# Patient Record
Sex: Female | Born: 1940 | Race: White | Hispanic: No | Smoking: Former smoker
Health system: Southern US, Community
[De-identification: ages and names within clinical notes are randomized; demographics above are authoritative.]

## PROBLEM LIST (undated history)

## (undated) DIAGNOSIS — I10 Essential (primary) hypertension: Secondary | ICD-10-CM

## (undated) DIAGNOSIS — K5790 Diverticulosis of intestine, part unspecified, without perforation or abscess without bleeding: Secondary | ICD-10-CM

## (undated) DIAGNOSIS — I2510 Atherosclerotic heart disease of native coronary artery without angina pectoris: Secondary | ICD-10-CM

## (undated) DIAGNOSIS — Z8739 Personal history of other diseases of the musculoskeletal system and connective tissue: Secondary | ICD-10-CM

## (undated) DIAGNOSIS — G4733 Obstructive sleep apnea (adult) (pediatric): Secondary | ICD-10-CM

## (undated) DIAGNOSIS — G2581 Restless legs syndrome: Secondary | ICD-10-CM

## (undated) DIAGNOSIS — N183 Chronic kidney disease, stage 3 unspecified: Secondary | ICD-10-CM

## (undated) DIAGNOSIS — T8859XA Other complications of anesthesia, initial encounter: Secondary | ICD-10-CM

## (undated) DIAGNOSIS — I255 Ischemic cardiomyopathy: Secondary | ICD-10-CM

## (undated) DIAGNOSIS — F329 Major depressive disorder, single episode, unspecified: Secondary | ICD-10-CM

## (undated) DIAGNOSIS — C4491 Basal cell carcinoma of skin, unspecified: Secondary | ICD-10-CM

## (undated) DIAGNOSIS — M545 Low back pain, unspecified: Secondary | ICD-10-CM

## (undated) DIAGNOSIS — Z9989 Dependence on other enabling machines and devices: Secondary | ICD-10-CM

## (undated) DIAGNOSIS — Z9981 Dependence on supplemental oxygen: Secondary | ICD-10-CM

## (undated) DIAGNOSIS — M4800 Spinal stenosis, site unspecified: Secondary | ICD-10-CM

## (undated) DIAGNOSIS — Z87442 Personal history of urinary calculi: Secondary | ICD-10-CM

## (undated) DIAGNOSIS — G8929 Other chronic pain: Secondary | ICD-10-CM

## (undated) DIAGNOSIS — R079 Chest pain, unspecified: Secondary | ICD-10-CM

## (undated) DIAGNOSIS — Z8489 Family history of other specified conditions: Secondary | ICD-10-CM

## (undated) DIAGNOSIS — I739 Peripheral vascular disease, unspecified: Secondary | ICD-10-CM

## (undated) DIAGNOSIS — E785 Hyperlipidemia, unspecified: Secondary | ICD-10-CM

## (undated) DIAGNOSIS — T4145XA Adverse effect of unspecified anesthetic, initial encounter: Secondary | ICD-10-CM

## (undated) DIAGNOSIS — F419 Anxiety disorder, unspecified: Secondary | ICD-10-CM

## (undated) DIAGNOSIS — J189 Pneumonia, unspecified organism: Secondary | ICD-10-CM

## (undated) DIAGNOSIS — F32A Depression, unspecified: Secondary | ICD-10-CM

## (undated) DIAGNOSIS — K8020 Calculus of gallbladder without cholecystitis without obstruction: Secondary | ICD-10-CM

## (undated) DIAGNOSIS — C434 Malignant melanoma of scalp and neck: Secondary | ICD-10-CM

## (undated) DIAGNOSIS — R0789 Other chest pain: Secondary | ICD-10-CM

## (undated) DIAGNOSIS — E559 Vitamin D deficiency, unspecified: Secondary | ICD-10-CM

## (undated) DIAGNOSIS — K219 Gastro-esophageal reflux disease without esophagitis: Secondary | ICD-10-CM

## (undated) DIAGNOSIS — M1990 Unspecified osteoarthritis, unspecified site: Secondary | ICD-10-CM

## (undated) DIAGNOSIS — E119 Type 2 diabetes mellitus without complications: Secondary | ICD-10-CM

## (undated) DIAGNOSIS — G629 Polyneuropathy, unspecified: Secondary | ICD-10-CM

## (undated) DIAGNOSIS — J449 Chronic obstructive pulmonary disease, unspecified: Secondary | ICD-10-CM

## (undated) DIAGNOSIS — D649 Anemia, unspecified: Secondary | ICD-10-CM

## (undated) DIAGNOSIS — I214 Non-ST elevation (NSTEMI) myocardial infarction: Secondary | ICD-10-CM

## (undated) DIAGNOSIS — K589 Irritable bowel syndrome without diarrhea: Secondary | ICD-10-CM

## (undated) DIAGNOSIS — Z972 Presence of dental prosthetic device (complete) (partial): Secondary | ICD-10-CM

## (undated) HISTORY — PX: TOTAL KNEE ARTHROPLASTY: SHX125

## (undated) HISTORY — PX: DEBRIDEMENT TENNIS ELBOW: SHX1442

## (undated) HISTORY — PX: MELANOMA EXCISION: SHX5266

## (undated) HISTORY — DX: Hyperlipidemia, unspecified: E78.5

## (undated) HISTORY — PX: BACK SURGERY: SHX140

## (undated) HISTORY — PX: LUMBAR DISC SURGERY: SHX700

## (undated) HISTORY — DX: Diverticulosis of intestine, part unspecified, without perforation or abscess without bleeding: K57.90

## (undated) HISTORY — DX: Other chest pain: R07.89

## (undated) HISTORY — PX: SHOULDER ARTHROSCOPY W/ ROTATOR CUFF REPAIR: SHX2400

## (undated) HISTORY — DX: Unspecified osteoarthritis, unspecified site: M19.90

## (undated) HISTORY — DX: Vitamin D deficiency, unspecified: E55.9

## (undated) HISTORY — PX: CATARACT EXTRACTION, BILATERAL: SHX1313

## (undated) HISTORY — PX: APPENDECTOMY: SHX54

## (undated) HISTORY — PX: SHOULDER OPEN ROTATOR CUFF REPAIR: SHX2407

## (undated) HISTORY — PX: CARPAL TUNNEL RELEASE: SHX101

## (undated) HISTORY — PX: BASAL CELL CARCINOMA EXCISION: SHX1214

## (undated) HISTORY — DX: Restless legs syndrome: G25.81

## (undated) HISTORY — DX: Peripheral vascular disease, unspecified: I73.9

## (undated) HISTORY — DX: Essential (primary) hypertension: I10

## (undated) HISTORY — PX: JOINT REPLACEMENT: SHX530

## (undated) HISTORY — DX: Calculus of gallbladder without cholecystitis without obstruction: K80.20

## (undated) HISTORY — DX: Irritable bowel syndrome, unspecified: K58.9

## (undated) HISTORY — DX: Anxiety disorder, unspecified: F41.9

## (undated) NOTE — *Deleted (*Deleted)
HEMATOLOGY/ONCOLOGY CLINIC NOTE  Date of Service: 04/08/2020  Patient Care Team: Lucky Cowboy, MD as PCP - General (Internal Medicine) Chilton Si, MD as PCP - Cardiology (Cardiology) Janet Berlin, MD as Consulting Physician (Ophthalmology) Laurey Morale, MD as Consulting Physician (Cardiology) Exie Parody, MD as Consulting Physician (Oncology) Jones Broom, MD as Consulting Physician (Orthopedic Surgery) Danis, Andreas Blower, MD as Consulting Physician (Gastroenterology)  REFERRING PHYSICIAN: Lucky Cowboy, MD  CHIEF COMPLAINTS/PURPOSE OF CONSULTATION:  Pancytopenia  HISTORY OF PRESENTING ILLNESS:  Janet Choi is a wonderful 59 y.o. female who has been referred to [place] by Lucky Cowboy, MD for evaluation and management of pancytopenia. Pt is accompanied today by her sister, Janet Choi. The pt reports that she is doing well overall.   The pt reports she is good. She has acute inflammation in the gallbladder and it bothers her when she eats. Pt has chronic diarrhea and has been diagnosed with sleep apnea. She was given a sleep apnea machine about 7 years ago but did not use it. Pt's diabetes has not been well controlled recently. She is also on aspirin since her heart attack. Pt has been on Requip for several years now. She also has been on gabapentin for neuropathy for several years. Pt has starting taking iron supplement this week. She was recently placed on protonix and taken off of 100mg  of allopurinol. There is no liver or thyroid problems that she knows of.   She felt really bad after the second dose of the COVID19 vaccine and stayed in for 14 days back in April. She was really sick and her arm was red and swollen where the shot was administered. At the beginning of the year the pt had cellulitis and was on antibiotics for more than 3 weeks.   Pt has never had blood transfusions or IV iron. She has not had a gout attack in several years.   Of note prior to  the patient's visit today, pt has had US Abdomen Complete ([id number]) completed on 10/17/19 with results revealing "Cholelithiasis with positive sonographic Murphy's sign and mild wall thickening these changes would be consistent with acute cholecystitis in the appropriate clinical setting. HIDA scan may be helpful for further evaluation. Left renal calculi without obstructive changes. Left renal cyst."  Most recent lab results (10/15/19) of CBC is as follows: all values are WNL except for WBC at 2.5K, RBC at 2.65, Hemoglobin at 8.7, HCT at 26.5, Neutro Abs at 1353, Absolute Monocytes at 133, Glucose at 107, Creat at 1.30, GFR, Est Non Af Am at 39, GFR, Est AFR Am at 46, Total Protein at 5.8, AST at 8  On review of systems, pt reports SOB, chronic diarrhea, sleep apnea, fatigue, abdominal tenderness and denies infections, black/blood in stool, fever, chills, night sweats, unexpected weight loss, mouth soars, and any other symptoms.   On PMHx the pt reports chronic diarrhea, sleep apnea, diabetes, myocardial infarction, Vitamin B12 deficient, neuropathy, cellulitis  On Social Hx the pt reports no alcohol use   INTERVAL HISTORY: Janet Choi is a wonderful 73 y.o. female who is here for evaluation and management of pancytopenia.  The patient's last visit with [place] was on 02/08/2020. The pt reports that she is doing well overall.  The pt reports ***  Of note since the patient's last visit, pt has had *** completed on *** with results revealing ***.  Lab results today (04/08/20) of CBC w/diff and CMP is as follows: all values are  WNL except for ***. 04/09/2020 Vitamin B12 at *** 04/09/2020 Ferritin at *** 04/09/2020 Iron Panel is as follows: ***  On review of systems, pt reports *** and denies ***and any other symptoms.   A&P: -Discussed pt labwork today, 04/08/20; *** -***  MEDICAL HISTORY:  Past Medical History:  Diagnosis Date  . Anemia    hx (04/14/2016)  . Anxiety   . Arthritis     "severe in my back; hands; ankles" (04/14/2016)  . Atypical chest pain 01/24/2018   Midline pain absent supine "constant" daytime since 12/31/17 > resolved as of 02/20/2018 on gerd/ gas diet   . Basal cell carcinoma    "several burned off; one cut off"  . CAD in native artery    a. NSTEMI 04/2016 - s/p DES toLAD and LCx. PCI to LCx notable for microembolization during cath.  . Chest pain- reslved with stopping Brilinta now on Plaix 04/16/2016  . Chronic lower back pain   . CKD (chronic kidney disease), stage III (HCC)    stage 3  . Complication of anesthesia   . COPD (chronic obstructive pulmonary disease) (HCC)   . Depression   . Diverticulosis   . DJD (degenerative joint disease)   . Family history of adverse reaction to anesthesia   . Gallstones   . GERD (gastroesophageal reflux disease)   . History of gout   . History of kidney stones   . Hyperlipidemia   . Hypertension   . IBS (irritable bowel syndrome)   . Ischemic cardiomyopathy    a. 04/2016: EF 40-50% by cath, 50-55% +WMA by echo.  . 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  . Morbid obesity (HCC)   . NSTEMI (non-ST elevated myocardial infarction) (HCC) 04/14/2016  . Obstructive sleep apnea of adult    appt for cpap 8-8 to adjust settings no cpap at this time  . On supplemental oxygen therapy    uses at bedtime or exposed to heat, 2Liters  . Peripheral neuropathy    feet and toes  . Peripheral vascular disease (HCC)   . RLS (restless legs syndrome)   . Spinal stenosis   . Type II diabetes mellitus (HCC)   . Vitamin D deficiency   . Walking pneumonia   . Wears partial dentures    lower     SURGICAL HISTORY: Past Surgical History:  Procedure Laterality Date  . APPENDECTOMY    . BASAL CELL CARCINOMA EXCISION Left    leg  . BIOPSY  12/25/2019   Procedure: BIOPSY;  Surgeon: Shellia Cleverly, DO;  Location: [facility];  Service: Gastroenterology;;  EGD and COLON  . CARDIAC CATHETERIZATION N/A  04/15/2016   Procedure: Left Heart Cath and Coronary Angiography;  Surgeon: Lyn Records, MD;  Location[facility] INVASIVE CV LAB;  Service: Cardiovascular;  Laterality: N/A;  . CARDIAC CATHETERIZATION N/A 04/15/2016   Procedure: Coronary Stent Intervention;  Surgeon: Lyn Records, MD;  Location: [facility] INVASIVE CV LAB;  Service: Cardiovascular;  Laterality: N/A;  Mid LAD Mid CFX  . CARPAL TUNNEL RELEASE Right    with trigger finger release  . CATARACT EXTRACTION, BILATERAL Bilateral   . COLONOSCOPY WITH PROPOFOL N/A 12/25/2019   Procedure: COLONOSCOPY WITH PROPOFOL;  Surgeon: Shellia Cleverly, DO;  Location: [facility];  Service: Gastroenterology;  Laterality: N/A;  . DEBRIDEMENT TENNIS ELBOW Right   . ESOPHAGOGASTRODUODENOSCOPY (EGD) WITH PROPOFOL N/A 12/25/2019   Procedure: ESOPHAGOGASTRODUODENOSCOPY (EGD) WITH PROPOFOL;  Surgeon: Shellia Cleverly, DO;  Location: [facility];  Service: Gastroenterology;  Laterality: N/A;  . LUMBAR DISC SURGERY  X 2  . MELANOMA EXCISION Left    "towards the back of my neck"  . POLYPECTOMY  12/25/2019   Procedure: POLYPECTOMY;  Surgeon: Shellia Cleverly, DO;  Location: [facility];  Service: Gastroenterology;;  . SHOULDER ARTHROSCOPY W/ ROTATOR CUFF REPAIR Left   . SHOULDER OPEN ROTATOR CUFF REPAIR Right   . TOTAL KNEE ARTHROPLASTY Bilateral      SOCIAL HISTORY: Social History   Socioeconomic History  . Marital status: Widowed    Spouse name: Not on file  . Number of children: 1  . Years of education: Not on file  . Highest education level: Not on file  Occupational History  . Occupation: retired  Tobacco Use  . Smoking status: Former Smoker    Packs/day: 2.50    Years: 25.00    Pack years: 62.50    Types: Cigarettes    Quit date: 11/11/1983    Years since quitting: 36.4  . Smokeless tobacco: Never Used  Vaping Use  . Vaping Use: Never used  Substance and Sexual Activity  . Alcohol use: No    Alcohol/week: 0.0 standard drinks  . Drug use:  No  . Sexual activity: Not Currently  Other Topics Concern  . Not on file  Social History Narrative  . Not on file   Social Determinants of Health   Financial Resource Strain:   . Difficulty of Paying Living Expenses: Not on file  Food Insecurity:   . Worried About Programme researcher, broadcasting/film/video in the Last Year: Not on file  . Ran Out of Food in the Last Year: Not on file  Transportation Needs:   . Lack of Transportation (Medical): Not on file  . Lack of Transportation (Non-Medical): Not on file  Physical Activity:   . Days of Exercise per Week: Not on file  . Minutes of Exercise per Session: Not on file  Stress:   . Feeling of Stress : Not on file  Social Connections:   . Frequency of Communication with Friends and Family: Not on file  . Frequency of Social Gatherings with Friends and Family: Not on file  . Attends Religious Services: Not on file  . Active Member of Clubs or Organizations: Not on file  . Attends Banker Meetings: Not on file  . Marital Status: Not on file  Intimate Partner Violence:   . Fear of Current or Ex-Partner: Not on file  . Emotionally Abused: Not on file  . Physically Abused: Not on file  . Sexually Abused: Not on file     FAMILY HISTORY: Family History  Problem Relation Age of Onset  . Heart disease Mother   . Heart attack Mother   . Kidney disease Father   . AAA (abdominal aortic aneurysm) Father   . Parkinson's disease Father   . Cancer Brother        type unknown  . Heart disease Son   . Cancer Sister        female   . Alzheimer's disease Sister   . Colon cancer Neg Hx   . Colon polyps Neg Hx   . Esophageal cancer Neg Hx   . Pancreatic cancer Neg Hx   . Stomach cancer Neg Hx   . Liver disease Neg Hx   . Diabetes Neg Hx      ALLERGIES:   is allergic to brilinta [ticagrelor], aspirin, nsaids, minocycline hcl, oruvail [  ketoprofen], tricor [fenofibrate], vasotec [enalaprilat], and zinc.   MEDICATIONS:  Current Outpatient  Medications  Medication Sig Dispense Refill  . acetaminophen (TYLENOL) 325 MG tablet Take 2 tablets (650 mg total) by mouth every 4 (four) hours as needed for headache or mild pain.    Marland Kitchen albuterol (VENTOLIN HFA) 108 (90 Base) MCG/ACT inhaler Inhale 2 puffs into the lungs every 4 (four) hours as needed for wheezing or shortness of breath. 18 g 3  . aspirin EC 81 MG EC tablet Take 1 tablet (81 mg total) by mouth daily.    Marland Kitchen atorvastatin (LIPITOR) 80 MG tablet TAKE 1 TABLET (80 MG TOTAL) BY MOUTH DAILY AT 6 PM. 90 tablet 2  . Budeson-Glycopyrrol-Formoterol (BREZTRI AEROSPHERE) 160-9-4.8 MCG/ACT AERO Inhale 2 puffs into the lungs in the morning and at bedtime. 5.9 g 2  . buPROPion (WELLBUTRIN XL) 300 MG 24 hr tablet TAKE 1 TABLET (300 MG TOTAL) BY MOUTH DAILY FOR MOOD 90 tablet 3  . Cholecalciferol ([employer] VIT D 5000 HIGH-POTENCY) 5000 units capsule Take 5,000 Units by mouth 2 (two) times daily.     . Continuous Blood Gluc Receiver (FREESTYLE LIBRE 14 DAY READER) DEVI 1 Device by Does not apply route every 14 (fourteen) days. 2 each 4  . Continuous Blood Gluc Sensor (FREESTYLE LIBRE 14 DAY SENSOR) MISC Apply 1 application topically every 14 (fourteen) days. 2 each 4  . diclofenac sodium (VOLTAREN) 1 % GEL Apply 4 g topically 4 (four) times daily. (Patient taking differently: Apply 4 g topically 4 (four) times daily as needed (pain). ) 100 g 3  . escitalopram (LEXAPRO) 20 MG tablet Take 1 tablet (20 mg total) by mouth daily. 90 tablet 3  . ferrous sulfate 325 (65 FE) MG tablet Take 325 mg by mouth 2 (two) times daily with a meal.     . furosemide (LASIX) 40 MG tablet Take 40 mg by mouth daily as needed for fluid.     Marland Kitchen gabapentin (NEURONTIN) 100 MG capsule Take 1 to 2 capsules 3 to 4 x /day as needed for Painful Diabetic Neuropathy (Patient taking differently: Take 200 mg by mouth 2 (two) times daily. ) 360 capsule 3  . Insulin Glargine (BASAGLAR KWIKPEN) 100 UNIT/ML SOPN Inject 70 units Daily for Diabetes  (Patient taking differently: Inject 70 Units into the skin at bedtime. Inject 70 units Daily for Diabetes) 20 pen 3  . Insulin Pen Needle (BD PEN NEEDLE NANO U/F) 32G X 4 MM MISC USE AS DIRECTED 4 TIMES A DAY 400 each 3  . ketoconazole (NIZORAL) 2 % cream APPLY TO AFFECTED AREA TWICE A DAY (Patient taking differently: Apply 1 application topically daily as needed (itching). ) 60 g 3  . Lancets (ONETOUCH ULTRASOFT) lancets Use as instructed 100 each 12  . liraglutide (VICTOZA) 18 MG/3ML SOPN Inject 0.2 mLs (1.2 mg total) into the skin every morning. 6 mL 3  . metoprolol succinate (TOPROL-XL) 25 MG 24 hr tablet Take 1 tablet (25 mg total) by mouth at bedtime. 90 tablet 3  . nitroGLYCERIN (NITROSTAT) 0.4 MG SL tablet Place 1 tablet (0.4 mg total) under the tongue every 5 (five) minutes x 3 doses as needed for chest pain. 25 tablet 4  . NOVOLOG FLEXPEN 100 UNIT/ML FlexPen INJECT 50 TO 75 UNITS 3 X /DAY BEFORE MEALS AS DIRECTED FOR DIABETES (Patient taking differently: Inject 20-25 Units into the skin 3 (three) times daily before meals. BS<150 Take 20 units BS>150 Take 25 units) 15 mL 4  .  ONETOUCH VERIO test strip USE AS INSTRUCTED TO CHECK SUGAR 2 TIMES DAILY. 200 each 4  . pantoprazole (PROTONIX) 40 MG tablet Take 1 tablet (40 mg total) by mouth 2 (two) times daily before a meal. 180 tablet 1  . Polyethylene Glycol 400 (BLINK TEARS OP) Place 1 drop into both eyes daily as needed (dry eyes).     Marland Kitchen rOPINIRole (REQUIP) 3 MG tablet TAKE 1 TABLET 4 TIMES A DAY FOR RESTLESS LEGS 360 tablet 3  . tamsulosin (FLOMAX) 0.4 MG CAPS capsule Take 1 capsule (0.4 mg total) by mouth daily. 30 capsule 0  . tiZANidine (ZANAFLEX) 4 MG tablet Take      1 tablet     at Bedtime        if needed for Muscle Spasms 90 tablet 0  . triamcinolone (NASACORT) 55 MCG/ACT AERO nasal inhaler PLACE 2 SPRAYS INTO THE NOSE AT BEDTIME. (Patient taking differently: Place 2 sprays into the nose at bedtime as needed (allergies). ) 48 g 3  .  vitamin B-12 (CYANOCOBALAMIN) 1000 MCG tablet Take 1,000 mcg by mouth daily.     No current facility-administered medications for this visit.   REVIEW OF SYSTEMS:   A 10+ POINT REVIEW OF SYSTEMS WAS OBTAINED including neurology, dermatology, psychiatry, cardiac, respiratory, lymph, extremities, GI, GU, Musculoskeletal, constitutional, breasts, reproductive, HEENT.  All pertinent positives are noted in the HPI.  All others are negative.   PHYSICAL EXAMINATION: ECOG PERFORMANCE STATUS: 2 - Symptomatic, <50% confined to bed  There were no vitals filed for this visit. There were no vitals filed for this visit. There is no height or weight on file to calculate BMI.  *** GENERAL:alert, in no acute distress and comfortable SKIN: no acute rashes, no significant lesions EYES: conjunctiva are pink and non-injected, sclera anicteric OROPHARYNX: MMM, no exudates, no oropharyngeal erythema or ulceration NECK: supple, no JVD LYMPH:  no palpable lymphadenopathy in the cervical, axillary or inguinal regions LUNGS: clear to auscultation b/l with normal respiratory effort HEART: regular rate & rhythm ABDOMEN:  normoactive bowel sounds , non tender, not distended. No palpable hepatosplenomegaly.  Extremity: no pedal edema PSYCH: alert & oriented x 3 with fluent speech NEURO: no focal motor/sensory deficits  LABORATORY DATA:  I have reviewed the data as listed  CBC Latest Ref Rng & Units 02/08/2020 01/08/2020 12/29/2019  WBC 4.0 - 10.5 K/uL 2.4(L) 2.3(L) 1.8(L)  Hemoglobin 12.0 - 15.0 g/dL 1.6(X) 0.9(U) 0.4(V)  Hematocrit 36 - 46 % 29.5(L) 28.7(L) 26.4(L)  Platelets 150 - 400 K/uL 120(L) 151 118(L)   . CBC    Component Value Date/Time   WBC 2.4 (L) 02/08/2020 1035   RBC 2.73 (L) 02/08/2020 1035   HGB 9.2 (L) 02/08/2020 1035   HGB 9.1 (L) 05/15/2009 1032   HCT 29.5 (L) 02/08/2020 1035   HCT 28.2 (L) 10/18/2019 1233   HCT 31.6 (L) 05/15/2009 1032   PLT 120 (L) 02/08/2020 1035   PLT 243  05/15/2009 1032   MCV 108.1 (H) 02/08/2020 1035   MCV 78.8 (L) 05/15/2009 1032   MCH 33.7 02/08/2020 1035   MCHC 31.2 02/08/2020 1035   RDW 13.9 02/08/2020 1035   RDW 17.2 (H) 05/15/2009 1032   LYMPHSABS 0.7 02/08/2020 1035   LYMPHSABS 1.2 05/15/2009 1032   MONOABS 0.1 02/08/2020 1035   MONOABS 0.3 05/15/2009 1032   EOSABS 0.1 02/08/2020 1035   EOSABS 0.1 05/15/2009 1032   BASOSABS 0.0 02/08/2020 1035   BASOSABS 0.0 05/15/2009 1032  CMP Latest Ref Rng & Units 02/08/2020 01/08/2020 12/29/2019  Glucose 70 - 99 mg/dL 096(E) 454(U) 981(X)  BUN 8 - 23 mg/dL 17 17 91(Y)  Creatinine 0.44 - 1.00 mg/dL 7.82(N) 5.62(Z) 3.08(M)  Sodium 135 - 145 mmol/L 141 144 143  Potassium 3.5 - 5.1 mmol/L 4.8 5.1 4.3  Chloride 98 - 111 mmol/L 106 106 106  CO2 22 - 32 mmol/L 28 27 26   Calcium 8.9 - 10.3 mg/dL 9.4 9.5 5.7(Q)  Total Protein 6.5 - 8.1 g/dL 6.3(L) 6.1 -  Total Bilirubin 0.3 - 1.2 mg/dL 0.3 0.5 -  Alkaline Phos 38 - 126 U/L 71 - -  AST 15 - 41 U/L 10(L) 11 -  ALT 0 - 44 U/L 8 10 -    RADIOGRAPHIC STUDIES: I have personally reviewed the radiological images as listed and agreed with the findings in the report. SLEEP STUDY DOCUMENTS  Result Date: 04/07/2020 Ordered by an unspecified provider.    ASSESSMENT & PLAN:   AHLEAH SIMKO is a 10 y.o. female with:  1. 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 ? MDS vs anemia of CKD 2. Thrombocytopenia - mild 120-->150k range 3. Leucopenia/Neutropenia. ANC ha simproved from 1.2k-->1.5k  PLAN: *** -Goal ferritin >100 in the setting of CKD. Continue iron supplement twice a day. -will consider IV Iron on f/u if ferritin still low. -might need consideration of EPO if anemia hgb<9 despite ferritin >100 -Recommends continue 1000 mcg of Vitamin B12 once a day, Vitamin D and iron supplements  -Recommends taking OTC 1 capsule of vitamin B-complex daily    FOLLOW UP ***   The total time spent in the appt was *** minutes and more than 50% was on  counseling and direct patient cares.  All of the patient's questions were answered with apparent satisfaction. The patient knows to call the clinic with any problems, questions or concerns.   Wyvonnia Lora MD MS AAHIVMS [facility] Hematology/Oncology Physician [facility]  (Office):       [phone] (Work cell):  [phone] (Fax):           [phone]  04/08/2020 10:36 PM    I, Carollee Herter, am acting as a scribe for Dr. Wyvonnia Lora.   {Add Production assistant, radio Statement}

---
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
## 1998-01-10 ENCOUNTER — Other Ambulatory Visit: Admission: RE | Admit: 1998-01-10 | Discharge: 1998-01-10 | Payer: Self-pay | Admitting: *Deleted

## 1998-04-02 ENCOUNTER — Ambulatory Visit (HOSPITAL_COMMUNITY): Admission: RE | Admit: 1998-04-02 | Discharge: 1998-04-02 | Payer: Self-pay | Admitting: *Deleted

## 1998-04-02 ENCOUNTER — Encounter: Payer: Self-pay | Admitting: Internal Medicine

## 1998-06-06 ENCOUNTER — Other Ambulatory Visit: Admission: RE | Admit: 1998-06-06 | Discharge: 1998-06-06 | Payer: Self-pay | Admitting: Gastroenterology

## 1999-03-05 ENCOUNTER — Encounter: Payer: Self-pay | Admitting: Orthopedic Surgery

## 1999-03-12 ENCOUNTER — Observation Stay (HOSPITAL_COMMUNITY): Admission: RE | Admit: 1999-03-12 | Discharge: 1999-03-13 | Payer: Self-pay | Admitting: Orthopedic Surgery

## 1999-04-01 ENCOUNTER — Encounter: Payer: Self-pay | Admitting: Orthopedic Surgery

## 1999-04-01 ENCOUNTER — Encounter: Admission: RE | Admit: 1999-04-01 | Discharge: 1999-04-01 | Payer: Self-pay | Admitting: Orthopedic Surgery

## 1999-04-10 ENCOUNTER — Encounter: Payer: Self-pay | Admitting: Orthopedic Surgery

## 1999-04-10 ENCOUNTER — Observation Stay (HOSPITAL_COMMUNITY): Admission: RE | Admit: 1999-04-10 | Discharge: 1999-04-11 | Payer: Self-pay | Admitting: Orthopedic Surgery

## 1999-05-25 ENCOUNTER — Ambulatory Visit (HOSPITAL_COMMUNITY): Admission: RE | Admit: 1999-05-25 | Discharge: 1999-05-25 | Payer: Self-pay | Admitting: Internal Medicine

## 1999-05-25 ENCOUNTER — Encounter: Payer: Self-pay | Admitting: Internal Medicine

## 2000-06-23 ENCOUNTER — Ambulatory Visit (HOSPITAL_COMMUNITY): Admission: RE | Admit: 2000-06-23 | Discharge: 2000-06-23 | Payer: Self-pay | Admitting: Internal Medicine

## 2000-06-23 ENCOUNTER — Encounter: Payer: Self-pay | Admitting: Internal Medicine

## 2001-10-20 ENCOUNTER — Encounter: Payer: Self-pay | Admitting: Internal Medicine

## 2001-10-20 ENCOUNTER — Ambulatory Visit (HOSPITAL_COMMUNITY): Admission: RE | Admit: 2001-10-20 | Discharge: 2001-10-20 | Payer: Self-pay | Admitting: Internal Medicine

## 2001-12-30 ENCOUNTER — Encounter: Payer: Self-pay | Admitting: Orthopedic Surgery

## 2001-12-30 ENCOUNTER — Encounter: Admission: RE | Admit: 2001-12-30 | Discharge: 2001-12-30 | Payer: Self-pay | Admitting: Orthopedic Surgery

## 2002-07-09 ENCOUNTER — Encounter: Admission: RE | Admit: 2002-07-09 | Discharge: 2002-07-09 | Payer: Self-pay | Admitting: *Deleted

## 2002-07-09 ENCOUNTER — Encounter: Payer: Self-pay | Admitting: *Deleted

## 2002-07-12 ENCOUNTER — Encounter: Payer: Self-pay | Admitting: *Deleted

## 2002-07-12 ENCOUNTER — Encounter: Admission: RE | Admit: 2002-07-12 | Discharge: 2002-07-12 | Payer: Self-pay | Admitting: *Deleted

## 2002-07-26 ENCOUNTER — Encounter: Payer: Self-pay | Admitting: Gastroenterology

## 2002-07-26 ENCOUNTER — Ambulatory Visit (HOSPITAL_COMMUNITY): Admission: RE | Admit: 2002-07-26 | Discharge: 2002-07-26 | Payer: Self-pay | Admitting: Gastroenterology

## 2003-03-14 ENCOUNTER — Other Ambulatory Visit: Admission: RE | Admit: 2003-03-14 | Discharge: 2003-03-14 | Payer: Self-pay | Admitting: Internal Medicine

## 2004-05-13 ENCOUNTER — Ambulatory Visit (HOSPITAL_COMMUNITY): Admission: RE | Admit: 2004-05-13 | Discharge: 2004-05-13 | Payer: Self-pay | Admitting: Orthopaedic Surgery

## 2004-05-18 ENCOUNTER — Ambulatory Visit (HOSPITAL_COMMUNITY): Admission: RE | Admit: 2004-05-18 | Discharge: 2004-05-19 | Payer: Self-pay | Admitting: Orthopaedic Surgery

## 2004-09-25 ENCOUNTER — Ambulatory Visit (HOSPITAL_COMMUNITY): Admission: RE | Admit: 2004-09-25 | Discharge: 2004-09-25 | Payer: Self-pay | Admitting: Internal Medicine

## 2004-12-04 ENCOUNTER — Ambulatory Visit (HOSPITAL_COMMUNITY): Admission: RE | Admit: 2004-12-04 | Discharge: 2004-12-04 | Payer: Self-pay | Admitting: Internal Medicine

## 2005-09-07 ENCOUNTER — Ambulatory Visit: Payer: Self-pay | Admitting: Gastroenterology

## 2005-09-24 ENCOUNTER — Encounter (INDEPENDENT_AMBULATORY_CARE_PROVIDER_SITE_OTHER): Payer: Self-pay | Admitting: Specialist

## 2005-09-24 ENCOUNTER — Ambulatory Visit: Payer: Self-pay | Admitting: Gastroenterology

## 2006-02-13 ENCOUNTER — Emergency Department (HOSPITAL_COMMUNITY): Admission: EM | Admit: 2006-02-13 | Discharge: 2006-02-13 | Payer: Self-pay | Admitting: Emergency Medicine

## 2006-02-14 ENCOUNTER — Ambulatory Visit (HOSPITAL_COMMUNITY): Admission: RE | Admit: 2006-02-14 | Discharge: 2006-02-14 | Payer: Self-pay | Admitting: Emergency Medicine

## 2006-02-14 ENCOUNTER — Encounter (INDEPENDENT_AMBULATORY_CARE_PROVIDER_SITE_OTHER): Payer: Self-pay | Admitting: *Deleted

## 2006-03-15 ENCOUNTER — Ambulatory Visit (HOSPITAL_COMMUNITY): Admission: RE | Admit: 2006-03-15 | Discharge: 2006-03-15 | Payer: Self-pay | Admitting: Family Medicine

## 2006-04-25 ENCOUNTER — Ambulatory Visit: Payer: Self-pay | Admitting: Gastroenterology

## 2006-08-05 ENCOUNTER — Ambulatory Visit: Payer: Self-pay | Admitting: Gastroenterology

## 2006-08-12 ENCOUNTER — Ambulatory Visit: Payer: Self-pay | Admitting: Gastroenterology

## 2006-09-02 ENCOUNTER — Ambulatory Visit: Payer: Self-pay | Admitting: Gastroenterology

## 2006-09-02 ENCOUNTER — Encounter: Payer: Self-pay | Admitting: Gastroenterology

## 2006-09-28 ENCOUNTER — Ambulatory Visit: Payer: Self-pay | Admitting: Gastroenterology

## 2006-09-28 LAB — CONVERTED CEMR LAB
Basophils Relative: 0.6 % (ref 0.0–1.0)
Hemoglobin: 10.8 g/dL — ABNORMAL LOW (ref 12.0–15.0)
Lymphocytes Relative: 29.1 % (ref 12.0–46.0)
MCHC: 33.5 g/dL (ref 30.0–36.0)
Monocytes Absolute: 0.4 10*3/uL (ref 0.2–0.7)
Monocytes Relative: 7.6 % (ref 3.0–11.0)
Neutro Abs: 3.1 10*3/uL (ref 1.4–7.7)

## 2007-01-25 ENCOUNTER — Encounter: Admission: RE | Admit: 2007-01-25 | Discharge: 2007-01-25 | Payer: Self-pay | Admitting: Orthopedic Surgery

## 2007-05-05 ENCOUNTER — Ambulatory Visit (HOSPITAL_COMMUNITY): Admission: RE | Admit: 2007-05-05 | Discharge: 2007-05-05 | Payer: Self-pay | Admitting: Internal Medicine

## 2007-05-19 ENCOUNTER — Ambulatory Visit (HOSPITAL_COMMUNITY): Admission: RE | Admit: 2007-05-19 | Discharge: 2007-05-19 | Payer: Self-pay | Admitting: Internal Medicine

## 2008-06-12 ENCOUNTER — Encounter: Payer: Self-pay | Admitting: Cardiology

## 2008-07-05 ENCOUNTER — Ambulatory Visit (HOSPITAL_COMMUNITY): Admission: RE | Admit: 2008-07-05 | Discharge: 2008-07-05 | Payer: Self-pay | Admitting: Internal Medicine

## 2009-02-21 ENCOUNTER — Telehealth: Payer: Self-pay | Admitting: Gastroenterology

## 2009-02-24 ENCOUNTER — Ambulatory Visit: Payer: Self-pay | Admitting: Gastroenterology

## 2009-02-24 DIAGNOSIS — K5730 Diverticulosis of large intestine without perforation or abscess without bleeding: Secondary | ICD-10-CM

## 2009-02-24 DIAGNOSIS — I10 Essential (primary) hypertension: Secondary | ICD-10-CM | POA: Insufficient documentation

## 2009-02-24 DIAGNOSIS — Z860100 Personal history of colon polyps, unspecified: Secondary | ICD-10-CM | POA: Insufficient documentation

## 2009-02-24 DIAGNOSIS — E785 Hyperlipidemia, unspecified: Secondary | ICD-10-CM

## 2009-02-24 LAB — CONVERTED CEMR LAB
Basophils Absolute: 0 10*3/uL (ref 0.0–0.1)
Eosinophils Absolute: 0.1 10*3/uL (ref 0.0–0.7)
Eosinophils Relative: 2.9 % (ref 0.0–5.0)
MCV: 86.7 fL (ref 78.0–100.0)
Monocytes Absolute: 0.3 10*3/uL (ref 0.1–1.0)
Neutrophils Relative %: 65 % (ref 43.0–77.0)
Platelets: 239 10*3/uL (ref 150.0–400.0)
RDW: 13.1 % (ref 11.5–14.6)
WBC: 4.2 10*3/uL — ABNORMAL LOW (ref 4.5–10.5)

## 2009-02-25 ENCOUNTER — Ambulatory Visit: Payer: Self-pay | Admitting: Physician Assistant

## 2009-02-25 LAB — CONVERTED CEMR LAB
Basophils Relative: 1.3 % (ref 0.0–3.0)
Eosinophils Relative: 2.3 % (ref 0.0–5.0)
Lymphocytes Relative: 26.6 % (ref 12.0–46.0)
MCV: 85.4 fL (ref 78.0–100.0)
Monocytes Relative: 5.8 % (ref 3.0–12.0)
Neutrophils Relative %: 64 % (ref 43.0–77.0)
RBC: 3.6 M/uL — ABNORMAL LOW (ref 3.87–5.11)
WBC: 4.9 10*3/uL (ref 4.5–10.5)

## 2009-02-26 ENCOUNTER — Ambulatory Visit: Payer: Self-pay | Admitting: Physician Assistant

## 2009-02-26 LAB — CONVERTED CEMR LAB
Basophils Relative: 1.1 % (ref 0.0–3.0)
Eosinophils Absolute: 0.1 10*3/uL (ref 0.0–0.7)
Eosinophils Relative: 2.1 % (ref 0.0–5.0)
Hemoglobin: 10.1 g/dL — ABNORMAL LOW (ref 12.0–15.0)
MCHC: 34 g/dL (ref 30.0–36.0)
MCV: 85.2 fL (ref 78.0–100.0)
Monocytes Absolute: 0.3 10*3/uL (ref 0.1–1.0)
Neutro Abs: 2.9 10*3/uL (ref 1.4–7.7)
RBC: 3.48 M/uL — ABNORMAL LOW (ref 3.87–5.11)
WBC: 4.7 10*3/uL (ref 4.5–10.5)

## 2009-03-25 ENCOUNTER — Ambulatory Visit (HOSPITAL_COMMUNITY): Admission: RE | Admit: 2009-03-25 | Discharge: 2009-03-25 | Payer: Self-pay | Admitting: Internal Medicine

## 2009-03-25 ENCOUNTER — Encounter: Payer: Self-pay | Admitting: Cardiology

## 2009-03-25 ENCOUNTER — Encounter: Payer: Self-pay | Admitting: Gastroenterology

## 2009-03-27 ENCOUNTER — Telehealth: Payer: Self-pay | Admitting: Gastroenterology

## 2009-04-08 ENCOUNTER — Ambulatory Visit: Payer: Self-pay | Admitting: Gastroenterology

## 2009-04-08 LAB — CONVERTED CEMR LAB
Basophils Absolute: 0.1 10*3/uL (ref 0.0–0.1)
Basophils Relative: 0.9 % (ref 0.0–3.0)
Eosinophils Absolute: 0.1 10*3/uL (ref 0.0–0.7)
Hemoglobin: 9.6 g/dL — ABNORMAL LOW (ref 12.0–15.0)
Lymphocytes Relative: 22.6 % (ref 12.0–46.0)
MCHC: 32.4 g/dL (ref 30.0–36.0)
MCV: 77.4 fL — ABNORMAL LOW (ref 78.0–100.0)
Monocytes Absolute: 0.4 10*3/uL (ref 0.1–1.0)
Neutro Abs: 3.7 10*3/uL (ref 1.4–7.7)
Neutrophils Relative %: 67.5 % (ref 43.0–77.0)
RDW: 16.2 % — ABNORMAL HIGH (ref 11.5–14.6)

## 2009-04-09 ENCOUNTER — Telehealth: Payer: Self-pay | Admitting: Gastroenterology

## 2009-04-15 ENCOUNTER — Ambulatory Visit (HOSPITAL_COMMUNITY): Admission: RE | Admit: 2009-04-15 | Discharge: 2009-04-15 | Payer: Self-pay | Admitting: Gastroenterology

## 2009-04-15 ENCOUNTER — Ambulatory Visit: Payer: Self-pay | Admitting: Gastroenterology

## 2009-04-15 LAB — HM COLONOSCOPY

## 2009-04-16 ENCOUNTER — Encounter: Payer: Self-pay | Admitting: Cardiology

## 2009-04-28 ENCOUNTER — Telehealth: Payer: Self-pay | Admitting: Gastroenterology

## 2009-04-29 ENCOUNTER — Encounter: Payer: Self-pay | Admitting: Gastroenterology

## 2009-04-29 ENCOUNTER — Encounter: Payer: Self-pay | Admitting: Cardiology

## 2009-05-05 ENCOUNTER — Ambulatory Visit: Payer: Self-pay | Admitting: Gastroenterology

## 2009-05-05 ENCOUNTER — Emergency Department (HOSPITAL_COMMUNITY): Admission: EM | Admit: 2009-05-05 | Discharge: 2009-05-05 | Payer: Self-pay | Admitting: Emergency Medicine

## 2009-05-05 ENCOUNTER — Ambulatory Visit: Payer: Self-pay | Admitting: Oncology

## 2009-05-05 LAB — CONVERTED CEMR LAB

## 2009-05-06 ENCOUNTER — Encounter: Payer: Self-pay | Admitting: Gastroenterology

## 2009-05-08 ENCOUNTER — Encounter: Payer: Self-pay | Admitting: Gastroenterology

## 2009-05-08 ENCOUNTER — Encounter: Payer: Self-pay | Admitting: Cardiology

## 2009-05-15 ENCOUNTER — Encounter: Payer: Self-pay | Admitting: Cardiology

## 2009-05-15 ENCOUNTER — Encounter: Payer: Self-pay | Admitting: Gastroenterology

## 2009-05-15 LAB — MORPHOLOGY

## 2009-05-15 LAB — CBC & DIFF AND RETIC
BASO%: 0.6 % (ref 0.0–2.0)
EOS%: 2.2 % (ref 0.0–7.0)
HCT: 31.6 % — ABNORMAL LOW (ref 34.8–46.6)
Immature Retic Fract: 35.7 % — ABNORMAL HIGH (ref 0.00–10.70)
LYMPH%: 23.2 % (ref 14.0–49.7)
MCH: 22.7 pg — ABNORMAL LOW (ref 25.1–34.0)
MCHC: 28.8 g/dL — ABNORMAL LOW (ref 31.5–36.0)
NEUT%: 69 % (ref 38.4–76.8)
Platelets: 243 10*3/uL (ref 145–400)

## 2009-05-16 ENCOUNTER — Ambulatory Visit: Payer: Self-pay | Admitting: Gastroenterology

## 2009-05-19 LAB — PROTEIN ELECTROPHORESIS, SERUM
Alpha-1-Globulin: 4.5 % (ref 2.9–4.9)
Alpha-2-Globulin: 13.5 % — ABNORMAL HIGH (ref 7.1–11.8)
Beta Globulin: 8.4 % — ABNORMAL HIGH (ref 4.7–7.2)
Total Protein, Serum Electrophoresis: 6.7 g/dL (ref 6.0–8.3)

## 2009-05-19 LAB — TSH: TSH: 1.49 u[IU]/mL (ref 0.350–4.500)

## 2009-05-19 LAB — LACTATE DEHYDROGENASE: LDH: 159 U/L (ref 94–250)

## 2009-05-19 LAB — COMPREHENSIVE METABOLIC PANEL
ALT: 12 U/L (ref 0–35)
BUN: 20 mg/dL (ref 6–23)
CO2: 23 mEq/L (ref 19–32)
Calcium: 9.3 mg/dL (ref 8.4–10.5)
Chloride: 99 mEq/L (ref 96–112)
Creatinine, Ser: 1 mg/dL (ref 0.40–1.20)

## 2009-05-19 LAB — KAPPA/LAMBDA LIGHT CHAINS
Kappa:Lambda Ratio: 0.71 (ref 0.26–1.65)
Lambda Free Lght Chn: 1.3 mg/dL (ref 0.57–2.63)

## 2009-05-19 LAB — IRON AND TIBC
%SAT: 4 % — ABNORMAL LOW (ref 20–55)
Iron: 24 ug/dL — ABNORMAL LOW (ref 42–145)

## 2009-06-05 ENCOUNTER — Encounter: Payer: Self-pay | Admitting: Internal Medicine

## 2009-06-27 ENCOUNTER — Ambulatory Visit: Payer: Self-pay | Admitting: Cardiology

## 2009-06-27 ENCOUNTER — Ambulatory Visit (HOSPITAL_COMMUNITY): Admission: RE | Admit: 2009-06-27 | Discharge: 2009-06-27 | Payer: Self-pay | Admitting: Cardiology

## 2009-06-27 ENCOUNTER — Ambulatory Visit: Payer: Self-pay

## 2009-06-27 ENCOUNTER — Encounter: Payer: Self-pay | Admitting: Cardiology

## 2009-07-01 ENCOUNTER — Telehealth (INDEPENDENT_AMBULATORY_CARE_PROVIDER_SITE_OTHER): Payer: Self-pay | Admitting: *Deleted

## 2009-07-02 ENCOUNTER — Ambulatory Visit: Payer: Self-pay | Admitting: Internal Medicine

## 2009-07-02 ENCOUNTER — Ambulatory Visit: Payer: Self-pay

## 2009-07-02 ENCOUNTER — Encounter (HOSPITAL_COMMUNITY): Admission: RE | Admit: 2009-07-02 | Discharge: 2009-09-09 | Payer: Self-pay | Admitting: Cardiology

## 2009-07-03 ENCOUNTER — Ambulatory Visit: Payer: Self-pay

## 2009-07-03 ENCOUNTER — Encounter (HOSPITAL_COMMUNITY): Admission: RE | Admit: 2009-07-03 | Discharge: 2009-09-09 | Payer: Self-pay | Admitting: Cardiology

## 2009-07-04 ENCOUNTER — Telehealth: Payer: Self-pay | Admitting: Cardiology

## 2009-07-04 LAB — CONVERTED CEMR LAB: Pro B Natriuretic peptide (BNP): 23 pg/mL (ref 0.0–100.0)

## 2009-07-14 ENCOUNTER — Encounter: Payer: Self-pay | Admitting: Gastroenterology

## 2009-07-17 ENCOUNTER — Ambulatory Visit: Payer: Self-pay | Admitting: Cardiology

## 2009-07-31 ENCOUNTER — Ambulatory Visit (HOSPITAL_COMMUNITY): Admission: RE | Admit: 2009-07-31 | Discharge: 2009-07-31 | Payer: Self-pay | Admitting: Internal Medicine

## 2010-02-04 ENCOUNTER — Encounter: Payer: Self-pay | Admitting: Gastroenterology

## 2010-05-31 ENCOUNTER — Encounter: Payer: Self-pay | Admitting: Internal Medicine

## 2010-06-11 NOTE — Letter (Signed)
[facility]: [facility]   Imported By: Lester Devens 06/07/2009 09:02:18  _____________________________________________________________________  External Attachment:    Type:   Image     Comment:   External Document

## 2010-06-11 NOTE — Assessment & Plan Note (Signed)
Summary: 3WK F/U SL   Primary Provider:  Shela Nevin  CC:  pt states she is feeling fine.  She is tired but her blood count came back ok just Monday.Marland Kitchen  History of Present Illness: 70 yo with history of anemia, diabetes, HTN, and hyperlipidemia presents for cardiac evaluation.  Patient has had quite significant exertional dyspnea.  This tends to be worse when her hemoglobin is lower.  With a hemoglobin around 8, she is short of breath just walking across a room.  Now, with hemoglobin > 9, she is short of breath only with more significant exertion such as climbing a flight of steps.  Currently, no shortness of breath walking on flat ground.  No orthopnea or PND.  She has had extensive workup of her anemia by GI and hematology.  She is both B12 deficient and iron deficient.  Endoscopies (upper, lower, and capsule) have shown gastritis and diverticulosis.  She is under a lot of stress (helps support her out-of-work son and cares for her father who is in his 73s and incapacitated).  She gets episodes of chest tightness that she associates with stress.  No definite connection to exertion.  She gets these episodes every few days and they tend to last < 5 minutes.  Patient has had no recent prolonged chest pain though she does report an episode of severe chest pain 30 years ago while walking in [place] that lasted for 30-45 minutes before it resolved.  She did not seek medical attention at the time.    I evaluated her with an echocardiogram, showing EF 55% and mild diastolic dysfunction.  Myoview showed EF 69%, no evidence for ischemia or infarction.  Hgb is up to 12 and she has been feeling well lately.   Labs (1/11): hgb 9.1, HCT 31.6 Labs (12/10): LDL 112, HDL 43, TGs 358, K 4.6, creatinine 0.94 Labs (3/11): hgb 12  Current Medications (verified): 1)  Hydrochlorothiazide 25 Mg Tabs (Hydrochlorothiazide) .Marland Kitchen.. 1 By Mouth Once Daily 2)  Metformin Hcl 500 Mg Tabs (Metformin Hcl) .... 2 By  Mouth Two Times A Day 3)  Meloxicam 15 Mg Tabs (Meloxicam) .Marland Kitchen.. 1 By Mouth Once Daily 4)  Prozac 40 Mg Caps (Fluoxetine Hcl) .... Every Other Day For One Week, Then Every 2 Days 1 Capsule Then Pt Will Discontinue. 5)  Requip 1 Mg Tabs (Ropinirole Hcl) .... 4 Per Day 6)  Verapamil Hcl Cr 180 Mg Cr-Tabs (Verapamil Hcl) .Marland Kitchen.. 1 By Mouth Once Daily 7)  Tramadol Hcl 50 Mg Tabs (Tramadol Hcl) .... As Needed 8)  Tylenol 325 Mg Tabs (Acetaminophen) .... As Needed 9)  Magnesium Oxide 250 Mg Tabs (Magnesium Oxide) .... Once Daily 10)  Cyanocobalamin 1000 Mcg/ml Soln (Cyanocobalamin) .... Inj Once A Month 11)  Hydrocodone-Acetaminophen 5-500 Mg Tabs (Hydrocodone-Acetaminophen) .... As Needed 12)  5% Sodium Chloride Opthalmic Solution 13)  Glimepiride 4 Mg Tabs (Glimepiride) .Marland Kitchen.. 1 Tab Two Times A Day 14)  Fish Oil  Oil (Fish Oil) .... 3 By Mouth Once Daily 15)  Amitriptyline Hcl 10 Mg Tabs (Amitriptyline Hcl) .... Take 1 Tablet Three Times A Day 16)  B Complex  Tabs (B Complex Vitamins) .Marland Kitchen.. 1 Tab Once Daily 17)  Dialyvite Vitamin D 5000 5000 Unit Caps (Cholecalciferol) .Marland Kitchen.. 1 Capsule Every Other Day 18)  Slow Fe 142 (45 Fe) Mg Cr-Tabs (Ferrous Sulfate) .... Take One Tablet Two Times A Day  Allergies (verified): 1)  ! * Minocin  Past History:  Past Medical History: 1.  H/o PVCs 2. B12 deficiency 3. HTN 4. DM2 5. Depression 6. Iron deficiency anemia: Workup with upper and lower endoscopy as well as capsule endoscopy has shown gastritis and diverticulosis.  It was thought that she had a diverticular bleed at one point.  7. OSA: Uses CPAP sporadically.  8. Obesity 9. Hyperlipidemia: Muscle pains with several statins.  10. Osteoarthritis 11. Echo (2/11): EF 50-55%, mild LAE, mild diastolic dysfunction, no regional wall motion abnormalities.  12. Myoview (2/11): EF 69%, no evidence for ischemia or infarction.   Family History: Reviewed history from 06/27/2009 and no changes required. Mother with  MI at 39.  Father with AAA.  2 uncles with CAD s/p CABG.   Social History: Reviewed history from 06/27/2009 and no changes required. Quit tobacco in 1984, heavy smoker before.  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   Vital Signs:  Patient profile:   70 year old female Height:      64 inches Weight:      253 pounds BMI:     43.58 Pulse rate:   89 / minute Pulse rhythm:   regular BP sitting:   102 / 56  (left arm) Cuff size:   large  Vitals Entered By: Judithe Modest CMA (July 17, 2009 1:41 PM)  Physical Exam  General:  Well developed, well nourished, in no acute distress. Obese.  Neck:  Neck supple, no JVD. No masses, thyromegaly or abnormal cervical nodes. Lungs:  Clear bilaterally to auscultation and percussion. Heart:  Non-displaced PMI, chest non-tender; regular rate and rhythm, S1, S2 without murmurs, rubs or gallops. Carotid upstroke normal, no bruit.  Pedals normal pulses. Trace ankle edema.  Abdomen:  Bowel sounds positive; abdomen soft and non-tender without masses, organomegaly, or hernias noted. No hepatosplenomegaly. Obese.  Extremities:  No clubbing or cyanosis. Neurologic:  Alert and oriented x 3. Psych:  Normal affect.   Impression & Recommendations:  Problem # 1:  CHEST PAIN (ICD-786.50) Patient has had atypical, nonexertional chest tightness.  She has exertional shortness of breath that is worse when her hemoglobin is lower.  She has multiple CAD risk factors (premature CAD in her mother, DM2, HTN, hyperlipidemia).  We did a myoview, which shows no evidence for ischemia or infarction, so I suspect that her chest pain is noncardiac.   Given her Fe deficiency anemia and possible history of diverticular bleeding, would hold off on ASA given no evidence of ischemia from myoview.    Problem # 2:  DYSPNEA  (ICD-786.05) Mild diastolic dysfunction only by echo.  I suspect that the dyspnea is a result of obesity and deconditioning.  I counselled the patient to exercise more and lose weight.   Patient Instructions: 1)  Your physician recommends that you schedule a follow-up appointment as needed.

## 2010-06-11 NOTE — Progress Notes (Signed)
Summary: Nuclear Pre-Procedure  Phone Note Outgoing Call Call back at [facility] Phone [phone]   Call placed by: Stanton Kidney, EMT-P,  July 01, 2009 1:56 PM Action Taken: Phone Call Completed Summary of Call: Left message with information on Myoview Information Sheet (see scanned document for details).     Nuclear Med Background Indications for Stress Test: Evaluation for Ischemia, Abnormal EKG   History: Echo, Myocardial Perfusion Study  History Comments: '04 MPS: EF= >65% 06/27/09 Echo: EF=50-55%  Symptoms: Chest Tightness, DOE, SOB    Nuclear Pre-Procedure Cardiac Risk Factors: Family History - CAD, History of Smoking, Hypertension, Lipids, NIDDM, Obesity Height (in): 64

## 2010-06-11 NOTE — Progress Notes (Signed)
Summary: Medication/Allergy List   Medication/Allergy List   Imported By: Roderic Ovens 07/16/2009 13:34:07  _____________________________________________________________________  External Attachment:    Type:   Image     Comment:   External Document

## 2010-06-11 NOTE — Assessment & Plan Note (Signed)
Summary: Cardiology Nuclear Study  Nuclear Med Background Indications for Stress Test: Evaluation for Ischemia, Abnormal EKG   History: Echo, Myocardial Perfusion Study  History Comments: '04 MPS: EF= >65% 06/27/09 Echo: EF=50-55%  Symptoms: Chest Tightness, DOE, Palpitations, SOB    Nuclear Pre-Procedure Cardiac Risk Factors: Family History - CAD, History of Smoking, Hypertension, Lipids, NIDDM, Obesity Caffeine/Decaff Intake: None NPO After: 11:00 PM Lungs: clear IV 0.9% NS with Angio Cath: 22g     IV Site: (R) Hand IV Started by: Irean Hong RN Chest Size (in) 46     Cup Size D     Height (in): 64 Weight (lb): 250 BMI: 43.07 Tech Comments: The patient started out walking on the treadmill and became too sob and was unable to keep up.  The patient was switched to a Lexiscan study.  Nuclear Med Study 1 or 2 day study:  2 day     Stress Test Type:  Eugenie Birks Reading MD:  Arvilla Meres, MD     Referring MD:  D.McLean Resting Radionuclide:  Technetium 83m Tetrofosmin     Resting Radionuclide Dose:  33.0 mCi  Stress Radionuclide:  Technetium 45m Tetrofosmin     Stress Radionuclide Dose:  33.0 mCi   Stress Protocol   Lexiscan: 0.4 mg   Stress Test Technologist:  Milana Na EMT-P     Nuclear Technologist:  Burna Mortimer Deal RT-N  Rest Procedure  Myocardial perfusion imaging was performed at rest 45 minutes following the intravenous administration of Myoview Technetium 29m Tetrofosmin.  Stress Procedure  The patient received IV Lexiscan 0.4 mg over 15-seconds.  Myoview injected at 30-seconds.  There were no significant changes with infusion.  Quantitative spect images were obtained after a 45 minute delay.  QPS Raw Data Images:  Normal; no motion artifact; normal heart/lung ratio. Stress Images:  There is normal uptake in all areas. Rest Images:  Normal homogeneous uptake in all areas of the myocardium. Subtraction (SDS):  Normal Transient Ischemic Dilatation:  .92   (Normal <1.22)  Lung/Heart Ratio:  .27  (Normal <0.45)  Quantitative Gated Spect Images QGS EDV:  90 ml QGS ESV:  28 ml QGS EF:  69 % QGS cine images:  Normal  Findings Normal nuclear study      Overall Impression  Exercise Capacity: Poor exercise tolerance. Switched to [place] study with no exercise. Clinical Symptoms: There is dyspnea. ECG Impression: Baseline: NSR; No significant ST segment change withLexiscan. Overall Impression: Normal stress nuclear study. Overall Impression Comments: The patient started out walking on the treadmill and became too dyspneic after 2:35 and was unable to keep up.  The patient was switched to a Lexiscan study.  Appended Document: Cardiology Nuclear Study normal lexiscan study  Appended Document: Cardiology Nuclear Study discussed results with pt by telephone

## 2010-06-11 NOTE — Assessment & Plan Note (Signed)
Summary: np6/cardiac eval/jml   Visit Type:  new pt visit Referring Provider:  Shela Nevin Primary Provider:  Shela Nevin  CC:  sob says she is anemic right now..edema/ankles...denies any cp.  History of Present Illness: 70 yo with history of anemia, diabetes, HTN, and hyperlipidemia presents for cardiac evaluation.  Patient has had quite significant exertional dyspnea.  This tends to be worse when her hemoglobin is lower.  With a hemoglobin around 8, she is short of breath just walking across a room.  Now, with hemoglobin > 9, she is short of breath only with more significant exertion such as climbing a flight of steps.  Currently, no shortness of breath walking on flat ground.  No orthopnea or PND.  She has had extensive workup of her anemia by GI and hematology.  She is both B12 deficient and iron deficient.  Endoscopies (upper, lower, and capsule) have shown gastritis and diverticulosis.  She is under a lot of stress (helps support her out-of-work son and cares for her father who is in his 91s and incapacitated).  She gets episodes of chest tightness that she associates with stress.  No definite connection to exertion.  She gets these episodes every few days and they tend to last < 5 minutes.  Patient has had no recent prolonged chest pain though she does report an episode of severe chest pain 30 years ago while walking in [place] that lasted for 30-45 minutes before it resolved.  She did not seek medical attention at the time.    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  I reviewed the ECG from Dr. Kathryne Sharper office which showed poor anterior R wave progression but Qs only in V1 and V2 (nonspecific).    Labs (1/11): hgb 9.1, HCT 31.6 Labs (12/10): LDL 112, HDL 43, TGs 358, K 4.6, creatinine 0.94  Current Medications (verified): 1)  Hydrochlorothiazide 25 Mg Tabs (Hydrochlorothiazide) .Marland Kitchen.. 1 By Mouth Once Daily 2)  Metformin Hcl 500 Mg Tabs  (Metformin Hcl) .... 2 By Mouth Two Times A Day 3)  Meloxicam 15 Mg Tabs (Meloxicam) .Marland Kitchen.. 1 By Mouth Once Daily 4)  Prozac 40 Mg Caps (Fluoxetine Hcl) .... Once Daily 5)  Requip 1 Mg Tabs (Ropinirole Hcl) .... 4 Per Day 6)  Verapamil Hcl Cr 180 Mg Cr-Tabs (Verapamil Hcl) .Marland Kitchen.. 1 By Mouth Once Daily 7)  Tramadol Hcl 50 Mg Tabs (Tramadol Hcl) .... As Needed 8)  Tylenol 325 Mg Tabs (Acetaminophen) .... As Needed 9)  Magnesium Oxide 250 Mg Tabs (Magnesium Oxide) .... Once Daily 10)  Cyanocobalamin 1000 Mcg/ml Soln (Cyanocobalamin) .... Inj Once A Month 11)  Hydrocodone-Acetaminophen 5-500 Mg Tabs (Hydrocodone-Acetaminophen) .... As Needed 12)  Fenofibrate Micronized 134 Mg Caps (Fenofibrate Micronized) .Marland Kitchen.. 1 By Mouth At Bedtime 13)  5% Sodium Chloride Opthalmic Solution 14)  Glimepiride 4 Mg Tabs (Glimepiride) .Marland Kitchen.. 1 Tab Two Times A Day 15)  Fish Oil  Oil (Fish Oil) .... 3 By Mouth Once Daily 16)  Amitriptyline Hcl 10 Mg Tabs (Amitriptyline Hcl) .... Take 1 Tablet Three Times A Day 17)  B Complex  Tabs (B Complex Vitamins) .Marland Kitchen.. 1 Tab Once Daily  Allergies: 1)  ! * Minicon  Past History:  Past Medical History: 1. H/o PVCs 2. B12 deficiency 3. HTN 4. DM2 5. Depression 6. Iron deficiency anemia: Workup with upper and lower endoscopy as well as capsule endoscopy has shown gastritis and diverticulosis.  It was thought that she had a diverticular  bleed at one point.  7. OSA: Uses CPAP sporadically.  8. Obesity 9. Hyperlipidemia: Muscle pains with several statins.  10. Osteoarthritis  Family History: Mother with MI at 93.  Father with AAA.  2 uncles with CAD s/p CABG.   Social History: Quit tobacco in 1984, heavy smoker before.  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   Vital Signs:  Patient profile:   70 year old female Height:      64  inches Weight:      250 pounds BMI:     43.07 Pulse rate:   83 / minute Pulse rhythm:   irregular BP sitting:   120 / 60  (left arm) Cuff size:   large  Vitals Entered By: Danielle Rankin, CMA (June 27, 2009 8:54 AM)  Physical Exam  General:  Well developed, well nourished, in no acute distress. Obese.  Head:  normocephalic and atraumatic Nose:  no deformity, discharge, inflammation, or lesions Mouth:  Teeth, gums and palate normal. Oral mucosa normal. Neck:  Neck supple, no JVD. No masses, thyromegaly or abnormal cervical nodes. Lungs:  Clear bilaterally to auscultation and percussion. Heart:  Non-displaced PMI, chest non-tender; regular rate and rhythm, S1, S2 without murmurs, rubs or gallops. Carotid upstroke normal, no bruit.  Pedals normal pulses. Trace ankle edema.  Abdomen:  Bowel sounds positive; abdomen soft and non-tender without masses, organomegaly, or hernias noted. No hepatosplenomegaly. Obese.  Msk:  Back normal, normal gait. Muscle strength and tone normal. Extremities:  No clubbing or cyanosis. Neurologic:  Alert and oriented x 3. Skin:  Intact without lesions or rashes. Psych:  Normal affect.   Impression & Recommendations:  Problem # 1:  CHEST PAIN (ICD-786.50) Patient has atypical, nonexertional chest tightness.  She has exertional shortness of breath that is worse when her hemoglobin is lower.  She has multiple CAD risk factors (premature CAD in her mother, DM2, HTN, hyperlipidemia).  Her ECG today is abnormal, suggesting prior anterior MI (poor R wave progression only on ECG from Dr. Kathryne Sharper office).  However, she denies any recent prolonged chest pain episode.  Given these findings, I think that it would be reasonable to risk stratify her with an ETT-myoview.  Ideally, I would have her start ASA 81 mg daily.  However, given her Fe deficiency anemia and possible history of diverticular bleeding, I am reticent to do this unless we have a firm diagnosis of CAD.    Problem # 2:  DYSPNEA (ICD-786.05) Patient has exertional shortness of breath.  Currently, she is short of breath with steps and more heavy exercise.  When hemoglobin was 8, she was short of breath just walking across a room.  This may just be due to obesity, deconditioning, and anemia.  Her volume status is difficult to assess due to body habitus but she does not appear volume overloaded on exam.  I will check a BNP today and we will get an echocardiogram.  If this testing and the myoview come back normal, I suspect that obesity and anemia can explain her cardiac symptoms.   Problem # 3:  HYPERTENSION (ICD-401.9) BP is well-controlled today.   Problem # 4:  HYPERLIPIDEMIA (ICD-272.4) Myalgias with several statins, she does not remember which.  She is on fish oil and fenofibrate for elevated triglycerides.  Will await myoview  before determining if any changes will be made to her regimen.   Other Orders: TLB-BNP (B-Natriuretic Peptide) (83880-BNPR) Echocardiogram (Echo) Nuclear Stress Test (Nuc Stress Test)  Patient Instructions: 1)  Your physician recommends that you have lab work today--BNP  786.50 786.09 2)  Your physician has requested that you have an exercise stress myoview.  For further information please visit [email or web address]  Please follow instruction sheet, as given. 3)  Your physician has requested that you have an echocardiogram.  Echocardiography is a painless test that uses sound waves to create images of your heart. It provides your doctor with information about the size and shape of your heart and how well your heart's chambers and valves are working.  This procedure takes approximately one hour. There are no restrictions for this procedure. 4)  Your physician recommends that you schedule a follow-up appointment in: 3 weeks with Dr Shirlee Latch after testing is completed

## 2010-06-11 NOTE — Progress Notes (Signed)
Summary: returning call/**phone rings bussy  Phone Note Call from Patient Call back at Home Phone [phone]   Caller: Patient Reason for Call: Talk to Nurse Summary of Call: returning call Initial call taken by: Migdalia Dk,  July 04, 2009 2:56 PM  Follow-up for Phone Call        Attemted  to call pt. phone rings bussy. Ollen Gross, RN, BSN  July 04, 2009 3:12 PM  Spoke with pt. Echo and BNP lab results given. Patient wants to make sure that  tests results be send to her PCP Dr. Lucky Cowboy. Ollen Gross, RN, BSN  July 04, 2009 3:41 PM       Appended Document: returning call/**phone rings bussy copy of echo from 06-27-09, copy of myoview from 07-02-09 faxed to Dr Oneta Rack

## 2010-07-27 ENCOUNTER — Encounter (HOSPITAL_COMMUNITY)
Admission: RE | Admit: 2010-07-27 | Discharge: 2010-07-27 | Disposition: A | Discharge status: Home / Self Care | Payer: Medicare Other | Source: Ambulatory Visit | Attending: Orthopedic Surgery | Admitting: Orthopedic Surgery

## 2010-07-27 LAB — COMPREHENSIVE METABOLIC PANEL
AST: 22 U/L (ref 0–37)
Albumin: 4 g/dL (ref 3.5–5.2)
Alkaline Phosphatase: 69 U/L (ref 39–117)
BUN: 11 mg/dL (ref 6–23)
Chloride: 100 mEq/L (ref 96–112)
Potassium: 3.8 mEq/L (ref 3.5–5.1)
Total Bilirubin: 0.4 mg/dL (ref 0.3–1.2)

## 2010-07-27 LAB — URINALYSIS, ROUTINE W REFLEX MICROSCOPIC
Glucose, UA: NEGATIVE mg/dL
Hgb urine dipstick: NEGATIVE
Specific Gravity, Urine: 1.022 (ref 1.005–1.030)
pH: 5 (ref 5.0–8.0)

## 2010-07-27 LAB — CBC
HCT: 43.7 % (ref 36.0–46.0)
Hemoglobin: 14.5 g/dL (ref 12.0–15.0)
RBC: 4.74 MIL/uL (ref 3.87–5.11)
WBC: 5.9 10*3/uL (ref 4.0–10.5)

## 2010-07-27 LAB — TYPE AND SCREEN: Antibody Screen: NEGATIVE

## 2010-07-27 LAB — DIFFERENTIAL
Basophils Absolute: 0 10*3/uL (ref 0.0–0.1)
Lymphocytes Relative: 25 % (ref 12–46)
Monocytes Absolute: 0.3 10*3/uL (ref 0.1–1.0)
Neutro Abs: 4 10*3/uL (ref 1.7–7.7)

## 2010-07-27 LAB — APTT: aPTT: 24 seconds (ref 24–37)

## 2010-07-31 ENCOUNTER — Inpatient Hospital Stay (HOSPITAL_COMMUNITY)
Admission: RE | Admit: 2010-07-31 | Discharge: 2010-08-03 | DRG: 470 | Disposition: A | Discharge status: Home Health | Payer: Medicare Other | Source: Ambulatory Visit | Attending: Orthopedic Surgery | Admitting: Orthopedic Surgery

## 2010-07-31 DIAGNOSIS — I10 Essential (primary) hypertension: Secondary | ICD-10-CM | POA: Diagnosis present

## 2010-07-31 DIAGNOSIS — G4733 Obstructive sleep apnea (adult) (pediatric): Secondary | ICD-10-CM | POA: Diagnosis present

## 2010-07-31 DIAGNOSIS — D510 Vitamin B12 deficiency anemia due to intrinsic factor deficiency: Secondary | ICD-10-CM | POA: Diagnosis present

## 2010-07-31 DIAGNOSIS — K219 Gastro-esophageal reflux disease without esophagitis: Secondary | ICD-10-CM | POA: Diagnosis present

## 2010-07-31 DIAGNOSIS — M1710 Unilateral primary osteoarthritis, unspecified knee: Principal | ICD-10-CM | POA: Diagnosis present

## 2010-07-31 DIAGNOSIS — E119 Type 2 diabetes mellitus without complications: Secondary | ICD-10-CM | POA: Diagnosis present

## 2010-07-31 LAB — GLUCOSE, CAPILLARY
Glucose-Capillary: 158 mg/dL — ABNORMAL HIGH (ref 70–99)
Glucose-Capillary: 185 mg/dL — ABNORMAL HIGH (ref 70–99)
Glucose-Capillary: 250 mg/dL — ABNORMAL HIGH (ref 70–99)

## 2010-08-01 LAB — GLUCOSE, CAPILLARY
Glucose-Capillary: 182 mg/dL — ABNORMAL HIGH (ref 70–99)
Glucose-Capillary: 193 mg/dL — ABNORMAL HIGH (ref 70–99)

## 2010-08-01 LAB — BASIC METABOLIC PANEL
Calcium: 8.3 mg/dL — ABNORMAL LOW (ref 8.4–10.5)
GFR calc Af Amer: >60 mL/min (ref >60)
GFR calc non Af Amer: 56 mL/min — ABNORMAL LOW (ref >60)
Sodium: 137 mEq/L (ref 135–145)

## 2010-08-01 LAB — CBC
Platelets: 208 10*3/uL (ref 150–400)
RBC: 3.7 MIL/uL — ABNORMAL LOW (ref 3.87–5.11)
WBC: 8 10*3/uL (ref 4.0–10.5)

## 2010-08-01 LAB — PROTIME-INR: Prothrombin Time: 13.8 seconds (ref 11.6–15.2)

## 2010-08-02 LAB — CBC
HCT: 31.4 % — ABNORMAL LOW (ref 36.0–46.0)
MCH: 30.3 pg (ref 26.0–34.0)
MCHC: 32.8 g/dL (ref 30.0–36.0)
RDW: 13 % (ref 11.5–15.5)

## 2010-08-02 LAB — BASIC METABOLIC PANEL
Calcium: 8.2 mg/dL — ABNORMAL LOW (ref 8.4–10.5)
Creatinine, Ser: 0.87 mg/dL (ref 0.4–1.2)
GFR calc non Af Amer: >60 mL/min (ref >60)
Glucose, Bld: 209 mg/dL — ABNORMAL HIGH (ref 70–99)
Sodium: 134 mEq/L — ABNORMAL LOW (ref 135–145)

## 2010-08-02 LAB — GLUCOSE, CAPILLARY
Glucose-Capillary: 132 mg/dL — ABNORMAL HIGH (ref 70–99)
Glucose-Capillary: 196 mg/dL — ABNORMAL HIGH (ref 70–99)

## 2010-08-03 LAB — BASIC METABOLIC PANEL
BUN: 20 mg/dL (ref 6–23)
Calcium: 8.2 mg/dL — ABNORMAL LOW (ref 8.4–10.5)
Creatinine, Ser: 0.83 mg/dL (ref 0.4–1.2)
GFR calc Af Amer: >60 mL/min (ref >60)
GFR calc non Af Amer: >60 mL/min (ref >60)

## 2010-08-03 LAB — GLUCOSE, CAPILLARY: Glucose-Capillary: 148 mg/dL — ABNORMAL HIGH (ref 70–99)

## 2010-08-03 LAB — CBC
HCT: 30.9 % — ABNORMAL LOW (ref 36.0–46.0)
Hemoglobin: 10.1 g/dL — ABNORMAL LOW (ref 12.0–15.0)
MCH: 30.1 pg (ref 26.0–34.0)
MCHC: 32.7 g/dL (ref 30.0–36.0)
MCV: 92.2 fL (ref 78.0–100.0)
RDW: 12.9 % (ref 11.5–15.5)

## 2010-08-04 NOTE — Op Note (Signed)
[name], [name]                ACCOUNT NO.:  [id number]  MEDICAL RECORD NO.:  [id number]           PATIENT TYPE:  I  LOCATION:  5010                         FACILITY:  [facility]  PHYSICIAN:  Harvie Junior, M.D.   DATE OF BIRTH:  1940-09-29  DATE OF PROCEDURE:  07/31/2010 DATE OF DISCHARGE:                              OPERATIVE REPORT   PREOPERATIVE DIAGNOSIS:  End-stage degenerative joint disease, bilateral knees.  POSTOPERATIVE DIAGNOSIS:  End-stage degenerative joint disease, bilateral knees.  PRINCIPAL PROCEDURES: 1. Left total knee replacement with Sigma system size 4, narrow femur,     size 3 tibia, 12.5-mm bridging bearing and a 35-mm all-polyethylene     patella. 2. Computer-assisted left total knee replacement.  SURGEON:  Harvie Junior, MD  ASSISTANT:  Marshia Ly, PA  ANESTHESIA:  General.  BRIEF HISTORY:  Mrs. Janet Choi is a 70 year old female with a long history of significant complaints of the left knee pain.  She had been treated conservatively for prolonged period time with activity modification, injection therapy, anti-inflammatories, use of a cane because of failure of all these conservative measures.  The patient was also taken to the operating room for left total knee replacement.  PROCEDURE:  The patient was taken to the operating room.  After adequate anesthesia was obtained with general anesthetic, the patient was placed supine on the operating table.  The left leg was then prepped and draped in usual sterile fashion.  Following this, the leg was exsanguinated and blood pressure tourniquet was inflated to 350 mmHg.  Following this, the midline incision was made and subcutaneous tissue down to the level of the extensor mechanism.  Medial parapatellar arthrotomy was undertaken. The medial and lateral meniscus were removed as well as retropatellar fat pad and synovium of the anterior aspect of the femur and an attention was turned towards placement  of the computer modules; two pins in the tibia, two pins in the femur.  The rays were placed and registration process was undertaken.  Once this was completed, attention was turned towards cutting the tibia perpendicular to its anatomic axis under computer assistance.  Attention was then turned to the femur, which was cut perpendicular to the anatomic axis under computer assistance.  Once this was completed, spacer blocks were put in place and extension gap was adequate and there was neutral long alignment. Once this was completed, attention was turned to the femur, which was sized to a 4, was cut anterior and posteriorly and then the chamfer cuts were made and box cut, placed the 4 femur, 4 narrow seemed to be a better fit, so I went to a 4 narrow.  Attention was then turned to the tibia, sized to a 3, drilled and keeled and trial components were put in place, 10 bearing initially seemed a little loose and went to a 12.5 and this felt to a give perfect neutral long alignment of the computer assistance as well as good flexion gap and stability.  Once this was completed, attention was turned to the patella, which was sized to a level of 23, we cut it down to a  level of 13 mm and then we put a five 35-mm paddle along there.  The lugs were drilled.  Once this was completed, the trial patella was put in place and then a range of motion was undertaken.  No mediolateral stability was achieved.  Once this completed, all trial components were removed.  The knee was copiously and thoroughly lavaged and suctioned dry.  Attention was then turned towards cementing and to the final component size 4 narrow femur, size 3 tibia, 12.5-mm bridging bearing and a 35-mm all-polyethylene patella. These were all cemented into place and patellar clamp was placed.  Once the cement was allowed to harden, attention was then turned towards removal of the trial.  A 12.5 component was then placed.  Tourniquet was let  down.  All bleeders were controlled with electrocautery.  Once this was completed, attention was turned towards placing the final polyethylene, this was placed and medium Hemovac drain placed and the medial parapatellar arthrotomy was closed with 0 Vicryl running, skin with a 2-0 Vicryl, and skin staples.  Sterile compressive dressing was applied.  The patient was taken to recovery room and was noted to be in satisfactory condition.  Estimated loss for the procedure was less than 150 mL.  Of note, Marshia Ly was assisted throughout the case and critical performance.     Harvie Junior, M.D.     Ranae Plumber  D:  07/31/2010  T:  08/01/2010  Job:  [id number]  Electronically Signed by Jodi Geralds M.D. on 08/04/2010 11:15:46 AM

## 2010-08-10 LAB — DIFFERENTIAL
Basophils Absolute: 0.1 10*3/uL (ref 0.0–0.1)
Basophils Relative: 1 % (ref 0–1)
Eosinophils Absolute: 0.2 10*3/uL (ref 0.0–0.7)
Monocytes Relative: 6 % (ref 3–12)
Neutrophils Relative %: 64 % (ref 43–77)

## 2010-08-10 LAB — PROTIME-INR: INR: 1 (ref 0.00–1.49)

## 2010-08-10 LAB — CBC
MCHC: 32.2 g/dL (ref 30.0–36.0)
MCV: 73.7 fL — ABNORMAL LOW (ref 78.0–100.0)
Platelets: 307 10*3/uL (ref 150–400)
RDW: 17.3 % — ABNORMAL HIGH (ref 11.5–15.5)

## 2010-08-10 LAB — CROSSMATCH: ABO/RH(D): AB POS

## 2010-09-10 NOTE — Discharge Summary (Signed)
Janet Choi, Janet Choi                ACCOUNT NO.:  [id number]  MEDICAL RECORD NO.:  [id number]           PATIENT TYPE:  I  LOCATION:  5010                         FACILITY:  [facility]  PHYSICIAN:  Harvie Junior, M.D.   DATE OF BIRTH:  06-18-1940  DATE OF ADMISSION:  07/31/2010 DATE OF DISCHARGE:  08/03/2010                              DISCHARGE SUMMARY   ADMITTING DIAGNOSES: 1. End-stage degenerative joint disease, left knee. 2. Type 2 diabetes. 3. Hypertension. 4. Anemia. 5. Gouty arthritis. 6. Gastroesophageal reflux disease.  DISCHARGE DIAGNOSES: 1. End-stage degenerative joint disease, left knee. 2. Type 2 diabetes. 3. Hypertension. 4. Anemia. 5. Gouty arthritis. 6. Gastroesophageal reflux disease.  PROCEDURES IN HOSPITAL:  Left total knee arthroplasty, computer assisted, Jodi Geralds, MD on July 31, 2010.  BRIEF HISTORY:  Janet Choi is a 70 year old female who presents with a long history of left knee pain.  X-rays show that she had severe bone-on- bone degenerative arthritis.  She had night pain and pain with ambulation.  She got no relief with exhaustive conservative treatment including injection therapy, modification of her activity, and oral medications.  Based upon her clinical and radiographic findings, she is felt to be a candidate for a left total knee replacement and is admitted for this.  PERTINENT LABORATORY STUDIES:  The patient's hemoglobin on admission was 14.5 with hematocrit of 43.  Her potassium was 3.8.  On postop day #1, the patient's hemoglobin was 11.1 with hematocrit of 34.1.  Postop #2, hemoglobin is 10.3.  Postop day #3, hemoglobin was 10.1 with  hematocrit of 30.9.  Platelet count was in normal range.  Her protime on admission was 13.8 seconds.  On the day of discharge on Coumadin therapy, her protime was 19.6 seconds and INR of 1.64.  CMET on admission was in normal range other than elevated glucose of 260.  EGFR was greater than 60 and  greater than 56.  CBGs during her hospitalization ranged from 132 up to 250 and were checked regularly.  EKG on admission showed normal sinus rhythm with left axis deviation.  No significant change since last tracing.  HOSPITAL COURSE:  The patient was admitted to the hospital and brought to the operating room where she underwent left total knee arthroplasty as well described in Dr. Luiz Blare' operative note.  Preoperatively, she was given 1 g of Ancef and 80 mg IV gentamicin.  She was given 1 g of Ancef q.8 h. x3 doses postoperatively.  She was started on Coumadin for DVT prophylaxis postop.  PCA morphine pump was used for pain control. Physical therapy was ordered for walker ambulation, weightbearing as tolerated on the left.  CPM machine was used for range of motion.  Foley catheter was placed at the time of surgery.  On postoperative day #1, the patient had moderate knee pain but was well controlled.  Her hemoglobin was stable.  Her PCA morphine pump was discontinued.  She was switched to oral medications.  She is on insulin sliding scale to manage her diabetes.  On postop day #2, she was improving.  She is without complaints.  Her vital  signs were stable.  Her left knee dressing was changed, and her Hemovac drain was pulled.  She is continued on Coumadin, continuing with physical therapy.  On postop day #3, the patient was without complaints.  She was taking fluids and voiding without difficulty.  She had had a bowel movement.  She was progressing well with physical therapy.  She is afebrile, and vital signs are stable.  Her left knee wound was clean and dry.  No sign of infection. Her hemoglobin was 10.1.  Her BUN was normal other than the slightly elevated glucose of 170.  Her INR was 1.64 on Coumadin.  She is discharged home.  Her IV was discontinued.  She was in improved condition.  She is on a diabetic diet.  She will need home health physical therapy and home CPM as well as a  home health RN for protimes and Coumadin management x1 month postop.  She is given prescription for Percocet 5 mg 1-2 q.6 h. p.r.n. pain, Coumadin as directed 1 daily x1 month per pharmacy protocol.  Other medications at discharge will be: 1. Tylenol 100 mg 1 tablet as needed. 2. Fish oil 1 tablet daily. 3. Vitamin D3 2000 units daily. 4. Slow iron 45 mg OTC daily. 5. Vitamin B12 injection one per month. 6. Allopurinol 300 mg daily. 7. Verapamil SR 180 mg daily. 8. Ropinirole 1 mg q.i.d. 9. Fluoxetine 1 daily. 10.Amitriptyline 10 mg t.i.d. 11.Metformin 500 mg 2 tablets b.i.d. 12.Hydrochlorothiazide  1 daily. 13.Amaryl 4 mg 1 b.i.d.  Activity status will be weightbearing as tolerated on the left, ambulating with a walker.  She will keep her left knee wound dry.  She will follow up in Dr. Luiz Blare' office in 10-14 days, sooner if any problems occur.     Marshia Ly, P.A.   ______________________________ Harvie Junior, M.D.    JB/MEDQ  D:  08/26/2010  T:  08/27/2010  Job:  [id number]  cc:   Lucky Cowboy, M.D.  Electronically Signed by Marshia Ly P.A. on 09/09/2010 11:26:22 AM Electronically Signed by Jodi Geralds M.D. on 09/10/2010 03:23:18 PM

## 2010-09-22 NOTE — Assessment & Plan Note (Signed)
[facility]                         GASTROENTEROLOGY OFFICE NOTE   [name], [name]                       MRN:          [id number]  DATE:09/28/2006                            DOB:          March 07, 1941    PROBLEM:  Iron deficiency anemia.   Janet Choi has returned for scheduled GI followup.  She underwent upper  and lower endoscopy, and capsule endoscopy for workup of her iron  deficiency anemia.  Upper endoscopy was pertinent for mild gastritis.  At colonoscopy, non-bleeding polyps were seen and removed.  Capsule  endoscopy demonstrated only a few red spots in the small intestine, but  no frank ulcers.  [name] was complaining of abdominal pain, which  has subsided.  She attributes this to her Sinemet.  She continues taking  Naprosyn twice a day.  Hemoccult's were requested, but have not been  submitted.   EXAMINATION:  Pulse 88.  Blood pressure 88/52.  Weight 259.   IMPRESSION:  Iron deficiency anemia.  I still suspect this is NSAID-  related.  She could have erosions or ulcers anywhere along the GI tract,  though they were not demonstrated by the above tests.   RECOMMENDATIONS:  1. Repeat CBC.  2. Follow up Hemoccult's.  3. I informed [name] that she is permitted to continue her      Naprosyn provided that her blood counts are stabilized.  She may      require supplemental iron, though I will wait until her blood test      results are back before prescribing this.     Barbette Hair. Arlyce Dice, MD,FACG  Electronically Signed    RDK/MedQ  DD: 09/28/2006  DT: 09/28/2006  Job #: [id number]   cc:   Lucky Cowboy, M.D.

## 2010-09-25 NOTE — Op Note (Signed)
[name], [name]                ACCOUNT NO.:  [id number]   MEDICAL RECORD NO.:  [id number]          PATIENT TYPE:  OIB   LOCATION:  2899                         FACILITY:  [facility]   PHYSICIAN:  Mark C. Ophelia Charter, M.D.    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   OPERATION PERFORMED:  Right L3-4 microdiskectomy with removal of free  fragment.   SURGEON:  Mark C. Ophelia Charter, M.D.   ANESTHESIA:  GOT.   ESTIMATED BLOOD LOSS:  Minimal.   DESCRIPTION OF PROCEDURE:  After induction of general anesthesia,  preoperative Ancef, back was prepped with DuraPrep.  The patient was then  transferred to an [place] frame.  She was a large patient and padding was  placed underneath her knee.  The patient had previous rotator cuff surgery  on the right and had good range of motion.  Care was taken to make sure that  the anterior aspect of the shoulder was well padded and the shoulder was not  abducted more than 90 degrees.  After prepping with DuraPrep and squaring  with towels, the old incision was marked with a skin marker.  Betadine and  Vi-drape applied.  Incision was made proximal to the old incision and  despite the previous laminectomy done on the left side, her skin incision  actually was slightly on the right side.  Incision was made cephalad to the  old incision.  Subcutaneous tissue was sharply dissected and at the expected  L3-4 level, a Kocher clamp was placed and confirmed with a cross table  lateral x-ray requiring double exposure.  Taylor retractor was placed  laterally.  The patient had an extremely narrow interpedicular distance and  because of this and the inferior aspect of the spinous process at 3 was  removed.  Top portion  of 4 was removed which would be about the upper third  or fourth and then the lamina was enlarged with a laminotomy performed at L3-  4.  Lateral gutter was followed down and then the top portion of L4 on the  right side was taken since the fragment had migrated inferiorly.  Thick  chunks of ligament were removed off the dura and overhanging facets first  were removed.  With the dura exposed, operating microscope was draped and  brought in for the microdiskectomy portion of the case.  With a #4 Penfield  sucker tip, the dura was gently retracted.  The disk was identified and  immediately, several free fragments were sucked up with a sucker that had  ruptured from the 3-4 level and had inferiorly migrated and was sitting  against the nerve root on the right.  D'Errico retractor was placed and  fragments were removed until there were no remaining fragments.  Annulus was  incised and multiple  masses were made with micropituitaries and regular  pituitary up and down to remove a small amount of disk material from the  disk.  Dura was well decompressed, nerve root was freed.  Passes were made  with hockey stick 180 degrees.  Wound was irrigated.  Operating  microscope was brought out.  The fascia was closed with 0 Vicryl, 2-0 Vicryl  in the subcutaneous tissue, skin staple closure and postoperative dressing.  The patient tolerated the procedure well and was transferred to recovery in  stable condition.       MCY/MEDQ  D:  05/18/2004  T:  05/18/2004  Job:  [id number]

## 2010-09-25 NOTE — Assessment & Plan Note (Signed)
[facility]                         GASTROENTEROLOGY OFFICE NOTE   WYNNE, ROZAK                       MRN:          [id number]  DATE:04/25/2006                            DOB:          12-08-1940    REASON FOR CONSULTATION:  Drop in hemoglobin.   Mrs. Klos is a pleasant 70 year old white female referred through the  courtesy of Dr. Oneta Rack for further evaluation.  She was seen  approximately a month ago in the ER because of severe epigastric pain.  Anemia was noted.  Mrs. Goren has been taking Naprosyn twice a day for  over a year.  She denies nausea.  Her episode of pain lasted  approximately 24 hours and has since subsided.  She was given Prevacid  which she took intermittently.  She is now pain-free.  There is no  history of melena or hematochezia.  She has a history of colon polyps  and diverticulosis.  She was last examined in 2004.   PAST MEDICAL HISTORY:  Pertinent for diabetes.  She has hypertension,  arthritis, and depression.  She is status post appendectomy.   FAMILY HISTORY:  Positive for mother with heart disease.   MEDICATIONS:  Amaryl.  Metformin.  Benicar.  Amitriptyline.  Baby  aspirin.  Naprosyn.  Vytorin.  Januvia.  Requip.   ALLERGIES:  SHE IS ALLERGIC TO __________.   SOCIAL HISTORY:  She neither smokes nor drinks.  She is married and  retired.   REVIEW OF SYSTEMS:  Positive for joint pains.   ON EXAM:  VITAL SIGNS:  Pulse 88.  Blood pressure 122/76.  Weight 254.  HEENT: EOMI. PERRLA. Sclerae are anicteric.  Conjunctivae are pink.  NECK:  Supple without thyromegaly, adenopathy or carotid bruits.  CHEST:  Clear to auscultation and percussion without adventitious  sounds.  CARDIAC:  Regular rhythm; normal S1 S2.  There are no murmurs, gallops  or rubs.  ABDOMEN:  Bowel sounds are normoactive.  Abdomen is soft, non-tender and  non-distended.  There are no abdominal masses, tenderness, splenic  enlargement or  hepatomegaly.  EXTREMITIES:  Full range of motion.  No cyanosis, clubbing or edema.  RECTAL:  Deferred.   On April 11, 2006, hemoglobin was 10, MCV was 81.2.   IMPRESSION:  Anemia.  She has a microcytic anemia which I suspect is  secondary to chronic GI blood loss.  Her chronic NSAID use certainly is  likely the cause of her anemia.  Ulcers could explain her abdominal pain  as well.   RECOMMENDATIONS:  1. Hold Naprosyn.  2. Upper endoscopy.  3. If negative, I will obtain serial Hemoccults.     Barbette Hair. Arlyce Dice, MD,FACG  Electronically Signed    RDK/MedQ  DD: 04/25/2006  DT: 04/25/2006  Job #: [id number]   cc:   Lucky Cowboy, M.D.

## 2010-10-17 ENCOUNTER — Encounter: Payer: Self-pay | Admitting: Gastroenterology

## 2010-12-22 ENCOUNTER — Other Ambulatory Visit (HOSPITAL_COMMUNITY): Payer: Self-pay | Admitting: Internal Medicine

## 2010-12-22 DIAGNOSIS — Z1231 Encounter for screening mammogram for malignant neoplasm of breast: Secondary | ICD-10-CM

## 2010-12-22 DIAGNOSIS — M949 Disorder of cartilage, unspecified: Secondary | ICD-10-CM

## 2010-12-22 DIAGNOSIS — M899 Disorder of bone, unspecified: Secondary | ICD-10-CM

## 2010-12-30 ENCOUNTER — Ambulatory Visit (HOSPITAL_COMMUNITY): Payer: Medicare Other

## 2011-01-05 ENCOUNTER — Ambulatory Visit (HOSPITAL_COMMUNITY)
Admission: RE | Admit: 2011-01-05 | Discharge: 2011-01-05 | Disposition: A | Discharge status: Home / Self Care | Payer: Medicare Other | Source: Ambulatory Visit | Attending: Internal Medicine | Admitting: Internal Medicine

## 2011-01-05 DIAGNOSIS — M899 Disorder of bone, unspecified: Secondary | ICD-10-CM

## 2011-01-05 DIAGNOSIS — Z13820 Encounter for screening for osteoporosis: Secondary | ICD-10-CM | POA: Insufficient documentation

## 2011-01-05 DIAGNOSIS — Z780 Asymptomatic menopausal state: Secondary | ICD-10-CM | POA: Insufficient documentation

## 2011-01-05 DIAGNOSIS — Z1231 Encounter for screening mammogram for malignant neoplasm of breast: Secondary | ICD-10-CM | POA: Insufficient documentation

## 2011-01-05 DIAGNOSIS — M949 Disorder of cartilage, unspecified: Secondary | ICD-10-CM

## 2011-01-06 ENCOUNTER — Other Ambulatory Visit (HOSPITAL_COMMUNITY): Payer: BC Managed Care – PPO

## 2011-01-07 ENCOUNTER — Other Ambulatory Visit: Payer: Self-pay | Admitting: Internal Medicine

## 2011-01-07 DIAGNOSIS — R928 Other abnormal and inconclusive findings on diagnostic imaging of breast: Secondary | ICD-10-CM

## 2011-01-14 ENCOUNTER — Ambulatory Visit (HOSPITAL_COMMUNITY)
Admission: RE | Admit: 2011-01-14 | Discharge: 2011-01-14 | Disposition: A | Discharge status: Home / Self Care | Payer: Medicare Other | Source: Ambulatory Visit | Attending: Orthopedic Surgery | Admitting: Orthopedic Surgery

## 2011-01-14 ENCOUNTER — Encounter (HOSPITAL_COMMUNITY)
Admission: RE | Admit: 2011-01-14 | Discharge: 2011-01-14 | Disposition: A | Discharge status: Home / Self Care | Payer: Medicare Other | Source: Ambulatory Visit | Attending: Orthopedic Surgery | Admitting: Orthopedic Surgery

## 2011-01-14 ENCOUNTER — Other Ambulatory Visit (HOSPITAL_COMMUNITY): Payer: Self-pay | Admitting: Orthopedic Surgery

## 2011-01-14 DIAGNOSIS — M1711 Unilateral primary osteoarthritis, right knee: Secondary | ICD-10-CM

## 2011-01-14 DIAGNOSIS — M1710 Unilateral primary osteoarthritis, unspecified knee: Secondary | ICD-10-CM | POA: Insufficient documentation

## 2011-01-14 DIAGNOSIS — IMO0002 Reserved for concepts with insufficient information to code with codable children: Secondary | ICD-10-CM | POA: Insufficient documentation

## 2011-01-14 DIAGNOSIS — Z01818 Encounter for other preprocedural examination: Secondary | ICD-10-CM | POA: Insufficient documentation

## 2011-01-14 DIAGNOSIS — Z01812 Encounter for preprocedural laboratory examination: Secondary | ICD-10-CM | POA: Insufficient documentation

## 2011-01-14 LAB — URINALYSIS, ROUTINE W REFLEX MICROSCOPIC
Glucose, UA: 500 mg/dL — AB
Ketones, ur: NEGATIVE mg/dL
Leukocytes, UA: NEGATIVE
pH: 5.5 (ref 5.0–8.0)

## 2011-01-14 LAB — BILIRUBIN, DIRECT: Bilirubin, Direct: <0.1 mg/dL (ref 0.0–0.3)

## 2011-01-14 LAB — PROTIME-INR: Prothrombin Time: 13 seconds (ref 11.6–15.2)

## 2011-01-14 LAB — APTT: aPTT: 28 seconds (ref 24–37)

## 2011-01-14 LAB — TYPE AND SCREEN: ABO/RH(D): AB POS

## 2011-01-15 ENCOUNTER — Other Ambulatory Visit: Payer: Medicare Other

## 2011-01-18 ENCOUNTER — Inpatient Hospital Stay (HOSPITAL_COMMUNITY)
Admission: RE | Admit: 2011-01-18 | Discharge: 2011-01-22 | DRG: 470 | Disposition: A | Discharge status: Home Health | Payer: Medicare Other | Source: Ambulatory Visit | Attending: Orthopedic Surgery | Admitting: Orthopedic Surgery

## 2011-01-18 DIAGNOSIS — Z87891 Personal history of nicotine dependence: Secondary | ICD-10-CM

## 2011-01-18 DIAGNOSIS — Z96659 Presence of unspecified artificial knee joint: Secondary | ICD-10-CM

## 2011-01-18 DIAGNOSIS — M109 Gout, unspecified: Secondary | ICD-10-CM | POA: Diagnosis present

## 2011-01-18 DIAGNOSIS — Z79899 Other long term (current) drug therapy: Secondary | ICD-10-CM

## 2011-01-18 DIAGNOSIS — M1710 Unilateral primary osteoarthritis, unspecified knee: Principal | ICD-10-CM | POA: Diagnosis present

## 2011-01-18 DIAGNOSIS — E119 Type 2 diabetes mellitus without complications: Secondary | ICD-10-CM | POA: Diagnosis present

## 2011-01-18 DIAGNOSIS — I10 Essential (primary) hypertension: Secondary | ICD-10-CM | POA: Diagnosis present

## 2011-01-18 DIAGNOSIS — K219 Gastro-esophageal reflux disease without esophagitis: Secondary | ICD-10-CM | POA: Diagnosis present

## 2011-01-18 DIAGNOSIS — E669 Obesity, unspecified: Secondary | ICD-10-CM | POA: Diagnosis present

## 2011-01-18 DIAGNOSIS — D62 Acute posthemorrhagic anemia: Secondary | ICD-10-CM | POA: Diagnosis not present

## 2011-01-18 DIAGNOSIS — Z7901 Long term (current) use of anticoagulants: Secondary | ICD-10-CM

## 2011-01-18 LAB — GLUCOSE, CAPILLARY: Glucose-Capillary: 197 mg/dL — ABNORMAL HIGH (ref 70–99)

## 2011-01-19 LAB — GLUCOSE, CAPILLARY
Glucose-Capillary: 175 mg/dL — ABNORMAL HIGH (ref 70–99)
Glucose-Capillary: 221 mg/dL — ABNORMAL HIGH (ref 70–99)

## 2011-01-19 LAB — CBC
MCHC: 32.6 g/dL (ref 30.0–36.0)
Platelets: 253 10*3/uL (ref 150–400)
RDW: 13.8 % (ref 11.5–15.5)

## 2011-01-19 LAB — BASIC METABOLIC PANEL
GFR calc Af Amer: >60 mL/min (ref >60)
GFR calc non Af Amer: >60 mL/min (ref >60)
Potassium: 3.7 mEq/L (ref 3.5–5.1)
Sodium: 136 mEq/L (ref 135–145)

## 2011-01-19 LAB — PROTIME-INR
INR: 1.16 (ref 0.00–1.49)
Prothrombin Time: 15 seconds (ref 11.6–15.2)

## 2011-01-20 LAB — GLUCOSE, CAPILLARY: Glucose-Capillary: 151 mg/dL — ABNORMAL HIGH (ref 70–99)

## 2011-01-20 LAB — CBC
MCH: 29.4 pg (ref 26.0–34.0)
MCV: 89.7 fL (ref 78.0–100.0)
Platelets: 219 10*3/uL (ref 150–400)
RBC: 2.82 MIL/uL — ABNORMAL LOW (ref 3.87–5.11)

## 2011-01-20 LAB — BASIC METABOLIC PANEL
BUN: 16 mg/dL (ref 6–23)
GFR calc non Af Amer: >60 mL/min (ref >60)
Glucose, Bld: 215 mg/dL — ABNORMAL HIGH (ref 70–99)
Potassium: 3.6 mEq/L (ref 3.5–5.1)

## 2011-01-21 ENCOUNTER — Inpatient Hospital Stay (HOSPITAL_COMMUNITY): Payer: Medicare Other

## 2011-01-21 LAB — BASIC METABOLIC PANEL
BUN: 17 mg/dL (ref 6–23)
Calcium: 9.3 mg/dL (ref 8.4–10.5)
Creatinine, Ser: 0.78 mg/dL (ref 0.50–1.10)
GFR calc non Af Amer: >60 mL/min (ref >60)
Glucose, Bld: 170 mg/dL — ABNORMAL HIGH (ref 70–99)

## 2011-01-21 LAB — CBC
MCH: 29.3 pg (ref 26.0–34.0)
MCHC: 32.7 g/dL (ref 30.0–36.0)
MCV: 89.5 fL (ref 78.0–100.0)
Platelets: 250 10*3/uL (ref 150–400)
RDW: 13.9 % (ref 11.5–15.5)
WBC: 5.5 10*3/uL (ref 4.0–10.5)

## 2011-01-21 LAB — GLUCOSE, CAPILLARY: Glucose-Capillary: 145 mg/dL — ABNORMAL HIGH (ref 70–99)

## 2011-01-21 NOTE — Op Note (Signed)
Janet Choi, Janet Choi                ACCOUNT NO.:  [id number]  MEDICAL RECORD NO.:  [id number]  LOCATION:  5036                         FACILITY:  [facility]  PHYSICIAN:  Harvie Junior, M.D.   DATE OF BIRTH:  07/14/1940  DATE OF PROCEDURE:  01/20/2011 DATE OF DISCHARGE:                              OPERATIVE REPORT   PREOPERATIVE DIAGNOSIS:  End-stage degenerative joint disease, right knee.  POSTOPERATIVE DIAGNOSIS:  End-stage degenerative joint disease, right knee.  PROCEDURE: 1. Right total knee replacement with a sigma system, size 3 femur,     size 3 tibia, 12.5-mm bridging bearing and a 35-mm all-polyethylene     patella. 2. Computer-assisted right total knee replacement.  SURGEON:  Harvie Junior, MD  ASSISTANT:  Marshia Ly, PA  ANESTHESIA:  General with a knee block.  BRIEF HISTORY:  Janet Choi is a 70 year old female with a long history of having had significant right knee pain.  She had a previous left total knee replacement and done great with that.  Right knee x-rays show bone-on-bone changes.  She had failed conservative care with anti- inflammatory medication, activity medication, and use of a cane. Because of her failure of all conservative care, the patient was taken to the operating room for right total knee replacement.  PROCEDURE:  The patient was taken to the operating room.  After adequate anesthesia was obtained with general anesthetic, the patient was placed supine on the operating table.  The right leg was then prepped and draped in usual sterile fashion.  Following this, the leg was exsanguinated.  Blood pressure tourniquet was inflated to 350 mmHg. Following this, a midline incision was made through subcutaneous tissue down to the level of the extensor mechanism.  Medial parapatellar arthrotomy was undertaken.  Once this was completed, attention was then turned to the right knee where medial and lateral meniscus were removed as well as  retropatellar fat pad, anterior and posterior cruciates and the synovium in the anterior aspect of the femur.  Following this, attention was turned towards the lining the computer modules, 2 pins in the tibia, 2 pins in the femur.  The arrays were placed and then the computer assistance process was undertaken.  Once that computer-assisted process was undertaken, it added about 30 minutes of surgical procedure. Once this was performed, the tibia then cut perpendicular to its long axis under computer assistance.  The femur was then cut perpendicular to the anatomic axis with anterior and posterior cuts made as well as a box cut, chamfer cuts.  Once this was completed, attention was turned towards putting in a trial spacer, a 10 was chosen.  We got perfect neutral long alignment by the computer as well as good gap balance. Following this, attention turned to the patella.  It was cut down to a level of 13 mm.  Lugs were drilled in the femur and the trial patella was placed.  The knee was put through a range of motion.  A little bit looseness I thought in extension went to a 12.5 poly, which gave perfect neutral long alignment and gap balance.  Once this was completed, all trial components were removed.  The knee  was then copiously and thoroughly lavaged with pulsatile lavage irrigation and the final components once the knee was completely dried were cemented in place; size 3 femur, size 3 tibia, 12.5-mm bridging bearing and a 35-mm all- polyethylene patella.  Once the cement was allowed to harden, the clamps were removed.  All excess bone cement was removed and attention was turned towards the removal of the trial poly.  The knee then had all bleeding controlled with electrocautery  and then the final polyethylene was put in place.  The medial parapatellar arthrotomy was closed with 1 Vicryl running over a medium Hemovac drain.  The skin was closed with 2- 0 Vicryl and skin staples.  Sterile  compressive dressing was applied as well as a knee immobilizer.  The patient was taken to the recovery room and was noted to be satisfactory condition.  Estimated blood loss for this procedure was none.     Harvie Junior, M.D.     Ranae Plumber  D:  01/18/2011  T:  01/19/2011  Job:  [id number]  Electronically Signed by Jodi Geralds M.D. on 01/21/2011 08:40:18 AM

## 2011-01-22 LAB — GLUCOSE, CAPILLARY

## 2011-02-15 ENCOUNTER — Other Ambulatory Visit: Payer: Medicare Other

## 2011-02-16 ENCOUNTER — Ambulatory Visit
Admission: RE | Admit: 2011-02-16 | Discharge: 2011-02-16 | Disposition: A | Discharge status: Home / Self Care | Payer: Medicare Other | Source: Ambulatory Visit | Attending: Internal Medicine | Admitting: Internal Medicine

## 2011-02-16 DIAGNOSIS — R928 Other abnormal and inconclusive findings on diagnostic imaging of breast: Secondary | ICD-10-CM

## 2011-03-04 NOTE — Discharge Summary (Signed)
Janet Choi, Janet Choi                ACCOUNT NO.:  [id number]  MEDICAL RECORD NO.:  [id number]  LOCATION:  5036                         FACILITY:  [facility]  PHYSICIAN:  Harvie Junior, M.D.   DATE OF BIRTH:  06-22-1940  DATE OF ADMISSION:  01/18/2011 DATE OF DISCHARGE:  01/22/2011                              DISCHARGE SUMMARY   ADMITTING DIAGNOSES: 1. End-stage degenerative joint disease, right knee. 2. Type 2 diabetes mellitus. 3. Chronic anemia. 4. Gouty arthritis. 5. Gastroesophageal reflux disease. 6. Hypertension. 7. Obesity.  DISCHARGE DIAGNOSES: 1. End-stage degenerative joint disease, right knee. 2. Type 2 diabetes mellitus. 3. Chronic anemia. 4. Gouty arthritis. 5. Gastroesophageal reflux disease. 6. Hypertension. 7. Obesity. 8. Acute blood loss anemia.  PROCEDURES IN HOSPITAL:  Right total knee arthroplasty computer assisted, Jodi Geralds, MD, January 18, 2011.  BRIEF HISTORY:  Janet Choi is a 70 year old female who has a greater than 35-month history of right knee pain.  Plain radiographs show that she has bone-on-bone degenerative arthritis of the right knee hip.  She has night pain and pain with ambulation.  She is using assistive device without results.  Based upon her clinical and radiographic findings, she is a candidate for a right total knee arthroplasties as she has failed nonoperative treatment.  She is admitted for this.  PERTINENT LABORATORY STUDIES:  The patient's hemoglobin on admission was greater than 10.  On postop day #1, her hemoglobin was 9.5 with a hematocrit of 29.1.  On postop day #2, her hemoglobin was 8.3.  On postop day #3, hemoglobin is 8.4.  The patient's protime on postop day #1 15.0 seconds with an INR of 1.16.  On the day of discharge, on Coumadin therapy, her protime was 21.9 seconds with an INR of 1.88. BMET on postop day #1 showed within normal range and elevated glucose of 209.  This was followed on a daily basis x3 days and  she did have a sodium of 134, potassium of 3.6, and continued to have some elevation of blood sugars.  Her eGFR was greater than 60.  CBCs were followed on a daily basis and were noted to be ranging from 221-146.  X-rays of the right knee on January 21, 2011, due to significant increased knee pain showed no adverse hardware features.  Suprapatellar soft tissue swelling and joint effusion was noted.  HOSPITAL COURSE:  The patient was brought to the operating room where she was preoperatively given 80 mg of IV gentamicin and 2 g IV of Ancef for prophylaxis.  She had a femoral nerve block by Anesthesia and was brought to the operating room where she underwent a right total knee arthroplasty computer assisted without complications.  Postoperatively, she was put on a PCA morphine pump for pain control.  IV fluids were instituted and Coumadin was started for DVT prophylaxis.  Physical therapy was ordered for walker, ambulation, and weightbearing as tolerated on the right side.  CPM machine was used for knee range of motion.  Foley catheter was placed at the time of surgery, was left until postop day #1.  On postop day #1, the patient had moderate knee pain, she had good relief for pain  with a PCA morphine pump.  Physical Therapy got her up, out of bed with a walker.  On postop day #2, she had moderate right knee pain as well, she was taking fluids and voiding without difficulty.  Her Foley catheter had been previously discontinued.  Her vital signs were stable.  Her O2 sats were 97% on 2 liters.  Her hemoglobin was 8.3 and her INR was 1.77.  Her Hemovac drain was pulled.  Her dressing was changed.  Her PCA morphine pump was discontinued.  Her IV was converted to a saline lock.  She was continued on an oral iron.  We added OxyContin 20 mg b.i.d. to help control her knee pain.  We obtained x-rays of the right knee because of increased knee pain on postop day #3 and these were unremarkable.  I  did feel that she was a candidate for continued rehabilitation and therapy.  On postop day #4, the patient was doing well.  She stated she was ready to home. She did not feel like she want to go to a skilled nursing facility.  She was taking fluids and voiding without difficulty.  She made good progress with physical therapy.  Vital signs were stable.  She was afebrile.  Her right knee wound was benign.  Her calf was soft and nontender.  Her INR was 1.88.  She is discharged home in improved condition.  She is on a regular diabetic diet.  She will need home health physical therapy and home health RN for protimes, for Coumadin management x1 month postop.  Her medications at discharge will include: 1. Gemfibrozil 600 mg 1 b.i.d. 2. Zetia 10 mg 1 daily. 3. Verapamil SR 180 mg daily. 4. Ropinirole 1 mg 1 tablet q.i.d. 5. Metformin 500 mg 2 tablets b.i.d. 6. Amaryl 4 mg 1 b.i.d. 7. Fluoxetine 40 mg 1 daily. 8. Amitriptyline 10 mg 3 times a day. 9. Allopurinol 300 mg 1 daily. 10.OTC allergy tablet p.r.n. 11.Percocet 5 mg 1-2 q.6 h. p.r.n. pain. 12.Robaxin 750 mg 1 q.8 h. p.r.n. spasm. 13.Coumadin 5 mg 1 daily as directed x1 month postop.  FOLLOWUP:  Dr. Luiz Blare in the office in 2 weeks.  Call sooner if any problems occur.  Office number is [phone].     Marshia Ly, P.A.   ______________________________ Harvie Junior, M.D.    Cordelia Pen  D:  01/31/2011  T:  01/31/2011  Job:  [id number]  cc:   Lucky Cowboy, M.D.  Electronically Signed by Marshia Ly P.A. on 02/18/2011 12:44:51 PM Electronically Signed by Jodi Geralds M.D. on 03/04/2011 02:10:25 PM

## 2011-07-23 ENCOUNTER — Other Ambulatory Visit: Payer: Self-pay | Admitting: Dermatology

## 2012-01-27 ENCOUNTER — Encounter: Payer: Self-pay | Admitting: Gastroenterology

## 2012-02-28 ENCOUNTER — Other Ambulatory Visit: Payer: Self-pay | Admitting: Orthopedic Surgery

## 2012-02-28 DIAGNOSIS — R29898 Other symptoms and signs involving the musculoskeletal system: Secondary | ICD-10-CM

## 2012-03-08 ENCOUNTER — Ambulatory Visit
Admission: RE | Admit: 2012-03-08 | Discharge: 2012-03-08 | Disposition: A | Discharge status: Home / Self Care | Payer: Medicare Other | Source: Ambulatory Visit | Attending: Orthopedic Surgery | Admitting: Orthopedic Surgery

## 2012-03-08 DIAGNOSIS — R29898 Other symptoms and signs involving the musculoskeletal system: Secondary | ICD-10-CM

## 2012-03-08 MED ORDER — IOHEXOL 180 MG/ML  SOLN
15.0000 mL | Freq: Once | INTRAMUSCULAR | Status: AC | PRN
  Administered 2012-03-08: 15 mL via INTRA_ARTICULAR

## 2013-01-05 ENCOUNTER — Ambulatory Visit (INDEPENDENT_AMBULATORY_CARE_PROVIDER_SITE_OTHER): Payer: Medicare Other | Admitting: Emergency Medicine

## 2013-01-05 VITALS — BP 120/76 | HR 101 | Temp 98.4°F | Resp 16 | Ht 63.0 in | Wt 219.0 lb

## 2013-01-05 DIAGNOSIS — L299 Pruritus, unspecified: Secondary | ICD-10-CM

## 2013-01-05 DIAGNOSIS — S81809A Unspecified open wound, unspecified lower leg, initial encounter: Secondary | ICD-10-CM

## 2013-01-05 DIAGNOSIS — S81812A Laceration without foreign body, left lower leg, initial encounter: Secondary | ICD-10-CM

## 2013-01-05 MED ORDER — HYDROXYZINE HCL 25 MG PO TABS: 25.0000 mg | ORAL_TABLET | Freq: Three times a day (TID) | ORAL | Status: DC | PRN

## 2013-01-05 NOTE — Progress Notes (Signed)
   [address], [place] [address]   Phone [phone]  Subjective:    Patient ID: Janet Choi, female    DOB: 09-14-1940, 72 y.o.   MRN: [id number]  HPI Pt presents to clinic with area on her L lower leg that will not stop bleeding. She has been having a terrible time with itching recently and shaved her legs last night in an attempt to stop the itching and she cut a varicose vein.  She called EMS and they bandaged the area and when she took the drsg off this afternoon it was still bleeding.  She is not on blood thinners.  She has been drinking a lot of powder lemonade and she stopped that today thinking it might be causing the itching.  She sees her PCP every 3 months for her diabetes.  Benadryl has not helped with the itching.   Review of Systems     Objective:   Physical Exam  Constitutional: She is oriented to person, place, and time. She appears well-developed and well-nourished.  HENT:  Head: Normocephalic and atraumatic.  Right Ear: External ear normal.  Left Ear: External ear normal.  Eyes: Conjunctivae are normal.  Neck: Normal range of motion.  Pulmonary/Chest: Effort normal.  Neurological: She is alert and oriented to person, place, and time.  Skin: Skin is warm and dry.     Multiple excoriations on the patients lower legs.  Psychiatric: She has a normal mood and affect. Her behavior is normal. Judgment and thought content normal.   Procedure:  Consent obtained.  Silver nitrate stick used for successful cautery.  Xeroform gauze placed on the wound and drsg.  Keep drsg for 48h than ok to remove - monitor for breakthrough bleeding.      Assessment & Plan:  Itching - Plan: hydrOXYzine (ATARAX/VISTARIL) 25 MG tablet - pt can also try Zyrtec and Zantac for the histamine release.  Laceration of left leg, initial encounter - cautery to the vessel was successful.  Benny Lennert PA-C 01/05/2013 7:18 PM

## 2013-01-05 NOTE — Patient Instructions (Addendum)
Zyrtec 10mg  a day Zantac 150mg  2 pills a day

## 2013-04-12 ENCOUNTER — Other Ambulatory Visit: Payer: Self-pay | Admitting: *Deleted

## 2013-04-12 MED ORDER — ROPINIROLE HCL 1 MG PO TABS: 1.0000 mg | ORAL_TABLET | Freq: Four times a day (QID) | ORAL | Status: DC

## 2013-05-01 ENCOUNTER — Ambulatory Visit: Payer: Self-pay | Admitting: Internal Medicine

## 2013-05-09 ENCOUNTER — Encounter: Payer: Self-pay | Admitting: Internal Medicine

## 2013-05-14 ENCOUNTER — Ambulatory Visit (INDEPENDENT_AMBULATORY_CARE_PROVIDER_SITE_OTHER): Payer: Medicare HMO | Admitting: Internal Medicine

## 2013-05-14 ENCOUNTER — Encounter: Payer: Self-pay | Admitting: Internal Medicine

## 2013-05-14 VITALS — BP 148/88 | HR 92 | Temp 98.2°F | Resp 18 | Wt 225.0 lb

## 2013-05-14 DIAGNOSIS — I10 Essential (primary) hypertension: Secondary | ICD-10-CM

## 2013-05-14 DIAGNOSIS — E119 Type 2 diabetes mellitus without complications: Secondary | ICD-10-CM

## 2013-05-14 DIAGNOSIS — E782 Mixed hyperlipidemia: Secondary | ICD-10-CM

## 2013-05-14 DIAGNOSIS — Z79899 Other long term (current) drug therapy: Secondary | ICD-10-CM

## 2013-05-14 DIAGNOSIS — E559 Vitamin D deficiency, unspecified: Secondary | ICD-10-CM

## 2013-05-14 LAB — CBC WITH DIFFERENTIAL/PLATELET
BASOS ABS: 0 10*3/uL (ref 0.0–0.1)
BASOS PCT: 0 % (ref 0–1)
EOS ABS: 0.2 10*3/uL (ref 0.0–0.7)
Eosinophils Relative: 4 % (ref 0–5)
HCT: 37 % (ref 36.0–46.0)
HEMOGLOBIN: 12.8 g/dL (ref 12.0–15.0)
Lymphocytes Relative: 29 % (ref 12–46)
Lymphs Abs: 1.6 10*3/uL (ref 0.7–4.0)
MCH: 29.3 pg (ref 26.0–34.0)
MCHC: 34.6 g/dL (ref 30.0–36.0)
MCV: 84.7 fL (ref 78.0–100.0)
MONOS PCT: 6 % (ref 3–12)
Monocytes Absolute: 0.3 10*3/uL (ref 0.1–1.0)
NEUTROS ABS: 3.4 10*3/uL (ref 1.7–7.7)
NEUTROS PCT: 61 % (ref 43–77)
PLATELETS: 259 10*3/uL (ref 150–400)
RBC: 4.37 MIL/uL (ref 3.87–5.11)
RDW: 13.3 % (ref 11.5–15.5)
WBC: 5.6 10*3/uL (ref 4.0–10.5)

## 2013-05-14 LAB — HEPATIC FUNCTION PANEL
ALT: 15 U/L (ref 0–35)
AST: 16 U/L (ref 0–37)
Albumin: 4.6 g/dL (ref 3.5–5.2)
Alkaline Phosphatase: 107 U/L (ref 39–117)
BILIRUBIN DIRECT: 0.1 mg/dL (ref 0.0–0.3)
BILIRUBIN INDIRECT: 0.3 mg/dL (ref 0.0–0.9)
BILIRUBIN TOTAL: 0.4 mg/dL (ref 0.3–1.2)
Total Protein: 7.1 g/dL (ref 6.0–8.3)

## 2013-05-14 LAB — BASIC METABOLIC PANEL WITH GFR
BUN: 19 mg/dL (ref 6–23)
CALCIUM: 9.9 mg/dL (ref 8.4–10.5)
CO2: 29 mEq/L (ref 19–32)
Chloride: 99 mEq/L (ref 96–112)
Creat: 1.06 mg/dL (ref 0.50–1.10)
GFR, EST AFRICAN AMERICAN: 61 mL/min
GFR, EST NON AFRICAN AMERICAN: 53 mL/min — AB
GLUCOSE: 210 mg/dL — AB (ref 70–99)
POTASSIUM: 4.2 meq/L (ref 3.5–5.3)
SODIUM: 140 meq/L (ref 135–145)

## 2013-05-14 LAB — LIPID PANEL
CHOL/HDL RATIO: 8.8 ratio
CHOLESTEROL: 335 mg/dL — AB (ref 0–200)
HDL: 38 mg/dL — AB (ref >39)
TRIGLYCERIDES: 528 mg/dL — AB (ref <150)

## 2013-05-14 LAB — MAGNESIUM: MAGNESIUM: 1.7 mg/dL (ref 1.5–2.5)

## 2013-05-14 NOTE — Patient Instructions (Signed)
Continue diet & medications same as discussed.   Further disposition pending lab results.      Obesity Obesity is having too much body fat and a body mass index (BMI) of 30 or more. BMI is a number based on your height and weight. The number is an estimate of how much body fat you have. Obesity can happen if you eat more calories than you can burn by exercising or other activity. It can cause major health problems or emergencies.  HOME CARE  Exercise and be active as told by your doctor. Try:  Using stairs when you can.  Parking farther away from store doors.  Gardening, biking, or walking.  Eat healthy foods and drinks that are low in calories. Eat more fruits and vegetables.  Limit fast food, sweets, and snack foods that are made with ingredients that are not natural (processed food).  Eat smaller amounts of food.  Keep a journal and write down what you eat every day. Websites can help with this.  Avoid drinking alcohol. Drink more water and drinks without calories.   Take vitamins and dietary pills (supplements) only as told by your doctor.  Try going to weight-loss support groups or classes to help lessen stress. Dieticians and counselors may also help. GET HELP RIGHT AWAY IF:  You have chest pain or tightness.  You have trouble breathing or feel short of breath.  You feel weak or have loss of feeling (numbness) in your legs.  You feel confused or have trouble talking.  You have sudden changes in your vision. MAKE SURE YOU:  Understand these instructions.  Will watch your condition.  Will get help right away if you are not doing well or get worse. Document Released: 07/19/2011 Document Reviewed: 07/19/2011 [facility] Patient Information 2014 [place], [place].  Obesity Obesity is defined as having too much total body fat and a body mass index (BMI) of 30 or more. BMI is an estimate of body fat and is calculated from your height and weight. Obesity happens when you  consume more calories than you can burn by exercising or performing daily physical tasks. Prolonged obesity can cause major illnesses or emergencies, such as:   A stroke.  Heart disease.  Diabetes.  Cancer.  Arthritis.  High blood pressure (hypertension).  High cholesterol.  Sleep apnea.  Erectile dysfunction.  Infertility problems. CAUSES   Regularly eating unhealthy foods.  Physical inactivity.  Certain disorders, such as an underactive thyroid (hypothyroidism), Cushing's syndrome, and polycystic ovarian syndrome.  Certain medicines, such as steroids, some depression medicines, and antipsychotics.  Genetics.  Lack of sleep. DIAGNOSIS  A caregiver can diagnose obesity after calculating your BMI. Obesity will be diagnosed if your BMI is 30 or higher.  There are other methods of measuring obesity levels. Some other methods include measuring your skin fold thickness, your waist circumference, and comparing your hip circumference to your waist circumference. TREATMENT  A healthy treatment program includes some or all of the following:  Long-term dietary changes.  Exercise and physical activity.  Behavioral and lifestyle changes.  Medicine only under the supervision of your caregiver. Medicines may help, but only if they are used with diet and exercise programs. An unhealthy treatment program includes:  Fasting.  Fad diets.  Supplements and drugs. These choices do not succeed in long-term weight control.  HOME CARE INSTRUCTIONS   Exercise and perform physical activity as directed by your caregiver. To increase physical activity, try the following:  Use stairs instead of elevators.  Park farther away from store entrances.  Garden, bike, or walk instead of watching television or using the computer.  Eat healthy, low-calorie foods and drinks on a regular basis. Eat more fruits and vegetables. Use low-calorie cookbooks or take healthy cooking classes.  Limit  fast food, sweets, and processed snack foods.  Eat smaller portions.  Keep a daily journal of everything you eat. There are many free websites to help you with this. It may be helpful to measure your foods so you can determine if you are eating the correct portion sizes.  Avoid drinking alcohol. Drink more water and drinks without calories.  Take vitamins and supplements only as recommended by your caregiver.  Weight-loss support groups, Nurse, mental health, counselors, and stress reduction education can also be very helpful. SEEK IMMEDIATE MEDICAL CARE IF:  You have chest pain or tightness.  You have trouble breathing or feel short of breath.  You have weakness or leg numbness.  You feel confused or have trouble talking.  You have sudden changes in your vision. MAKE SURE YOU:  Understand these instructions.  Will watch your condition.  Will get help right away if you are not doing well or get worse. Document Released: 06/03/2004 Document Revised: 10/26/2011 Document Reviewed: 06/02/2011 [facility] Patient Information 2014 [place].  Calorie Counting Diet A calorie counting diet requires you to eat the number of calories that are right for you in a day. Calories are the measurement of how much energy you get from the food you eat. Eating the right amount of calories is important for staying at a healthy weight. If you eat too many calories, your body will store them as fat and you may gain weight. If you eat too few calories, you may lose weight. Counting the number of calories you eat during a day will help you know if you are eating the right amount. A Registered Dietitian can determine how many calories you need in a day. The amount of calories needed varies from person to person. If your goal is to lose weight, you will need to eat fewer calories. Losing weight can benefit you if you are overweight or have health problems such as heart disease, high blood pressure, or  diabetes. If your goal is to gain weight, you will need to eat more calories. Gaining weight may be necessary if you have a certain health problem that causes your body to need more energy. TIPS Whether you are increasing or decreasing the number of calories you eat during a day, it may be hard to get used to changes in what you eat and drink. The following are tips to help you keep track of the number of calories you eat.  Measure foods at home with measuring cups. This helps you know the amount of food and number of calories you are eating.  Restaurants often serve food in amounts that are larger than 1 serving. While eating out, estimate how many servings of a food you are given. For example, a serving of cooked rice is  cup or about the size of half of a fist. Knowing serving sizes will help you be aware of how much food you are eating at restaurants.  Ask for smaller portion sizes or child-size portions at restaurants.  Plan to eat half of a meal at a restaurant. Take the rest home or share the other half with a friend.  Read the Nutrition Facts panel on food labels for calorie content and serving size. You can  find out how many servings are in a package, the size of a serving, and the number of calories each serving has.  For example, a package might contain 3 cookies. The Nutrition Facts panel on that package says that 1 serving is 1 cookie. Below that, it will say there are 3 servings in the container. The calories section of the Nutrition Facts label says there are 90 calories. This means there are 90 calories in 1 cookie (1 serving). If you eat 1 cookie you have eaten 90 calories. If you eat all 3 cookies, you have eaten 270 calories (3 servings x 90 calories = 270 calories). The list below tells you how big or small some common portion sizes are.  1 oz.........4 stacked dice.  3 oz........Marland KitchenDeck of cards.  1 tsp.......Marland KitchenTip of little finger.  1 tbs......Marland KitchenMarland KitchenThumb.  2 tbs.......Marland KitchenGolf  ball.   cup......Marland KitchenHalf of a fist.  1 cup.......Marland KitchenA fist. KEEP A FOOD LOG Write down every food item you eat, the amount you eat, and the number of calories in each food you eat during the day. At the end of the day, you can add up the total number of calories you have eaten. It may help to keep a list like the one below. Find out the calorie information by reading the Nutrition Facts panel on food labels. Breakfast  Bran cereal (1 cup, 110 calories).  Fat-free milk ( cup, 45 calories). Snack  Apple (1 medium, 80 calories). Lunch  Spinach (1 cup, 20 calories).  Tomato ( medium, 20 calories).  Chicken breast strips (3 oz, 165 calories).  Shredded cheddar cheese ( cup, 110 calories).  Light [place] dressing (2 tbs, 60 calories).  Whole-wheat bread (1 slice, 80 calories).  Tub margarine (1 tsp, 35 calories).  Vegetable soup (1 cup, 160 calories). Dinner  Pork chop (3 oz, 190 calories).  Brown rice (1 cup, 215 calories).  Steamed broccoli ( cup, 20 calories).  Strawberries (1  cup, 65 calories).  Whipped cream (1 tbs, 50 calories). Daily Calorie Total: 7169 Document Released: 04/26/2005 Document Revised: 07/19/2011 Document Reviewed: 10/21/2006 [facility] Patient Information 2014 [place].   Exercise to Lose Weight Exercise and a healthy diet may help you lose weight. Your doctor may suggest specific exercises. EXERCISE IDEAS AND TIPS  Choose low-cost things you enjoy doing, such as walking, bicycling, or exercising to workout videos.  Take stairs instead of the elevator.  Walk during your lunch break.  Park your car further away from work or school.  Go to a gym or an exercise class.  Start with 5 to 10 minutes of exercise each day. Build up to 30 minutes of exercise 4 to 6 days a week.  Wear shoes with good support and comfortable clothes.  Stretch before and after working out.  Work out until you breathe harder and your heart beats  faster.  Drink extra water when you exercise.  Do not do so much that you hurt yourself, feel dizzy, or get very short of breath. Exercises that burn about 150 calories:  Running 1  miles in 15 minutes.  Playing volleyball for 45 to 60 minutes.  Washing and waxing a car for 45 to 60 minutes.  Playing touch football for 45 minutes.  Walking 1  miles in 35 minutes.  Pushing a stroller 1  miles in 30 minutes.  Playing basketball for 30 minutes.  Raking leaves for 30 minutes.  Bicycling 5 miles in 30 minutes.  Walking 2 miles in 30 minutes.  Dancing for 30 minutes.  Shoveling snow for 15 minutes.  Swimming laps for 20 minutes.  Walking up stairs for 15 minutes.  Bicycling 4 miles in 15 minutes.  Gardening for 30 to 45 minutes.  Jumping rope for 15 minutes.  Washing windows or floors for 45 to 60 minutes. Document Released: 05/29/2010 Document Revised: 07/19/2011 Document Reviewed: 05/29/2010 [facility] Patient Information 2014 [place], [place].    Hypertension As your heart beats, it forces blood through your arteries. This force is your blood pressure. If the pressure is too high, it is called hypertension (HTN) or high blood pressure. HTN is dangerous because you may have it and not know it. High blood pressure may mean that your heart has to work harder to pump blood. Your arteries may be narrow or stiff. The extra work puts you at risk for heart disease, stroke, and other problems.  Blood pressure consists of two numbers, a higher number over a lower, 110/72, for example. It is stated as "110 over 72." The ideal is below 120 for the top number (systolic) and under 80 for the bottom (diastolic). Write down your blood pressure today. You should pay close attention to your blood pressure if you have certain conditions such as:  Heart failure.  Prior heart attack.  Diabetes  Chronic kidney disease.  Prior stroke.  Multiple risk factors for heart disease. To  see if you have HTN, your blood pressure should be measured while you are seated with your arm held at the level of the heart. It should be measured at least twice. A one-time elevated blood pressure reading (especially in the Emergency Department) does not mean that you need treatment. There may be conditions in which the blood pressure is different between your right and left arms. It is important to see your caregiver soon for a recheck. Most people have essential hypertension which means that there is not a specific cause. This type of high blood pressure may be lowered by changing lifestyle factors such as:  Stress.  Smoking.  Lack of exercise.  Excessive weight.  Drug/tobacco/alcohol use.  Eating less salt. Most people do not have symptoms from high blood pressure until it has caused damage to the body. Effective treatment can often prevent, delay or reduce that damage. TREATMENT  When a cause has been identified, treatment for high blood pressure is directed at the cause. There are a large number of medications to treat HTN. These fall into several categories, and your caregiver will help you select the medicines that are best for you. Medications may have side effects. You should review side effects with your caregiver. If your blood pressure stays high after you have made lifestyle changes or started on medicines,   Your medication(s) may need to be changed.  Other problems may need to be addressed.  Be certain you understand your prescriptions, and know how and when to take your medicine.  Be sure to follow up with your caregiver within the time frame advised (usually within two weeks) to have your blood pressure rechecked and to review your medications.  If you are taking more than one medicine to lower your blood pressure, make sure you know how and at what times they should be taken. Taking two medicines at the same time can result in blood pressure that is too low. SEEK  IMMEDIATE MEDICAL CARE IF:  You develop a severe headache, blurred or changing vision, or confusion.  You have unusual weakness or numbness, or a faint  feeling.  You have severe chest or abdominal pain, vomiting, or breathing problems. MAKE SURE YOU:   Understand these instructions.  Will watch your condition.  Will get help right away if you are not doing well or get worse. Document Released: 04/26/2005 Document Revised: 07/19/2011 Document Reviewed: 12/15/2007 [facility] Patient Information 2014 [place].  Diabetes and Exercise Exercising regularly is important. It is not just about losing weight. It has many health benefits, such as:  Improving your overall fitness, flexibility, and endurance.  Increasing your bone density.  Helping with weight control.  Decreasing your body fat.  Increasing your muscle strength.  Reducing stress and tension.  Improving your overall health. People with diabetes who exercise gain additional benefits because exercise:  Reduces appetite.  Improves the body's use of blood sugar (glucose).  Helps lower or control blood glucose.  Decreases blood pressure.  Helps control blood lipids (such as cholesterol and triglycerides).  Improves the body's use of the hormone insulin by:  Increasing the body's insulin sensitivity.  Reducing the body's insulin needs.  Decreases the risk for heart disease because exercising:  Lowers cholesterol and triglycerides levels.  Increases the levels of good cholesterol (such as high-density lipoproteins [HDL]) in the body.  Lowers blood glucose levels. YOUR ACTIVITY PLAN  Choose an activity that you enjoy and set realistic goals. Your health care provider or diabetes educator can help you make an activity plan that works for you. You can break activities into 2 or 3 sessions throughout the day. Doing so is as good as one long session. Exercise ideas include:  Taking the dog for a  walk.  Taking the stairs instead of the elevator.  Dancing to your favorite song.  Doing your favorite exercise with a friend. RECOMMENDATIONS FOR EXERCISING WITH TYPE 1 OR TYPE 2 DIABETES   Check your blood glucose before exercising. If blood glucose levels are greater than 240 mg/dL, check for urine ketones. Do not exercise if ketones are present.  Avoid injecting insulin into areas of the body that are going to be exercised. For example, avoid injecting insulin into:  The arms when playing tennis.  The legs when jogging.  Keep a record of:  Food intake before and after you exercise.  Expected peak times of insulin action.  Blood glucose levels before and after you exercise.  The type and amount of exercise you have done.  Review your records with your health care provider. Your health care provider will help you to develop guidelines for adjusting food intake and insulin amounts before and after exercising.  If you take insulin or oral hypoglycemic agents, watch for signs and symptoms of hypoglycemia. They include:  Dizziness.  Shaking.  Sweating.  Chills.  Confusion.  Drink plenty of water while you exercise to prevent dehydration or heat stroke. Body water is lost during exercise and must be replaced.  Talk to your health care provider before starting an exercise program to make sure it is safe for you. Remember, almost any type of activity is better than none. Document Released: 07/17/2003 Document Revised: 12/27/2012 Document Reviewed: 10/03/2012 [facility] Patient Information 2014 [place].  Cholesterol Cholesterol is a white, waxy, fat-like protein needed by your body in small amounts. The liver makes all the cholesterol you need. It is carried from the liver by the blood through the blood vessels. Deposits (plaque) may build up on blood vessel walls. This makes the arteries narrower and stiffer. Plaque increases the risk for heart attack and stroke.  You  cannot feel your cholesterol level even if it is very high. The only way to know is by a blood test to check your lipid (fats) levels. Once you know your cholesterol levels, you should keep a record of the test results. Work with your caregiver to to keep your levels in the desired range. WHAT THE RESULTS MEAN:  Total cholesterol is a rough measure of all the cholesterol in your blood.  LDL is the so-called bad cholesterol. This is the type that deposits cholesterol in the walls of the arteries. You want this level to be low.  HDL is the good cholesterol because it cleans the arteries and carries the LDL away. You want this level to be high.  Triglycerides are fat that the body can either burn for energy or store. High levels are closely linked to heart disease. DESIRED LEVELS:  Total cholesterol below 200.  LDL below 100 for people at risk, below 70 for very high risk.  HDL above 50 is good, above 60 is best.  Triglycerides below 150. HOW TO LOWER YOUR CHOLESTEROL:  Diet.  Choose fish or white meat chicken and [place], roasted or baked. Limit fatty cuts of red meat, fried foods, and processed meats, such as sausage and lunch meat.  Eat lots of fresh fruits and vegetables. Choose whole grains, beans, pasta, potatoes and cereals.  Use only small amounts of olive, corn or canola oils. Avoid butter, mayonnaise, shortening or palm kernel oils. Avoid foods with trans-fats.  Use skim/nonfat milk and low-fat/nonfat yogurt and cheeses. Avoid whole milk, cream, ice cream, egg yolks and cheeses. Healthy desserts include angel food cake, ginger snaps, animal crackers, hard candy, popsicles, and low-fat/nonfat frozen yogurt. Avoid pastries, cakes, pies and cookies.  Exercise.  A regular program helps decrease LDL and raises HDL.  Helps with weight control.  Do things that increase your activity level like gardening, walking, or taking the stairs.  Medication.  May be prescribed by your  caregiver to help lowering cholesterol and the risk for heart disease.  You may need medicine even if your levels are normal if you have several risk factors. HOME CARE INSTRUCTIONS   Follow your diet and exercise programs as suggested by your caregiver.  Take medications as directed.  Have blood work done when your caregiver feels it is necessary. MAKE SURE YOU:   Understand these instructions.  Will watch your condition.  Will get help right away if you are not doing well or get worse. Document Released: 01/19/2001 Document Revised: 07/19/2011 Document Reviewed: 07/12/2007 [facility] Patient Information 2014 [place], [place].  Vitamin D Deficiency Vitamin D is an important vitamin that your body needs. Having too little of it in your body is called a deficiency. A very bad deficiency can make your bones soft and can cause a condition called rickets.  Vitamin D is important to your body for different reasons, such as:   It helps your body absorb 2 minerals called calcium and phosphorus.  It helps make your bones healthy.  It may prevent some diseases, such as diabetes and multiple sclerosis.  It helps your muscles and heart. You can get vitamin D in several ways. It is a natural part of some foods. The vitamin is also added to some dairy products and cereals. Some people take vitamin D supplements. Also, your body makes vitamin D when you are in the sun. It changes the sun's rays into a form of the vitamin that your body can use. CAUSES  Not eating enough foods that contain vitamin D.  Not getting enough sunlight.  Having certain digestive system diseases that make it hard to absorb vitamin D. These diseases include Crohn's disease, chronic pancreatitis, and cystic fibrosis.  Having a surgery in which part of the stomach or small intestine is removed.  Being obese. Fat cells pull vitamin D out of your blood. That means that obese people may not have enough vitamin D left in  their blood and in other body tissues.  Having chronic kidney or liver disease. RISK FACTORS Risk factors are things that make you more likely to develop a vitamin D deficiency. They include:  Being older.  Not being able to get outside very much.  Living in a nursing home.  Having had broken bones.  Having weak or thin bones (osteoporosis).  Having a disease or condition that changes how your body absorbs vitamin D.  Having dark skin.  Some medicines such as seizure medicines or steroids.  Being overweight or obese. SYMPTOMS Mild cases of vitamin D deficiency may not have any symptoms. If you have a very bad case, symptoms may include:  Bone pain.  Muscle pain.  Falling often.  Broken bones caused by a minor injury, due to osteoporosis. DIAGNOSIS A blood test is the best way to tell if you have a vitamin D deficiency. TREATMENT Vitamin D deficiency can be treated in different ways. Treatment for vitamin D deficiency depends on what is causing it. Options include:  Taking vitamin D supplements.  Taking a calcium supplement. Your caregiver will suggest what dose is best for you. HOME CARE INSTRUCTIONS  Take any supplements that your caregiver prescribes. Follow the directions carefully. Take only the suggested amount.  Have your blood tested 2 months after you start taking supplements.  Eat foods that contain vitamin D. Healthy choices include:  Fortified dairy products, cereals, or juices. Fortified means vitamin D has been added to the food. Check the label on the package to be sure.  Fatty fish like salmon or trout.  Eggs.  Oysters.  Do not use a tanning bed.  Keep your weight at a healthy level. Lose weight if you need to.  Keep all follow-up appointments. Your caregiver will need to perform blood tests to make sure your vitamin D deficiency is going away. SEEK MEDICAL CARE IF:  You have any questions about your treatment.  You continue to have  symptoms of vitamin D deficiency.  You have nausea or vomiting.  You are constipated.  You feel confused.  You have severe abdominal or back pain. MAKE SURE YOU:  Understand these instructions.  Will watch your condition.  Will get help right away if you are not doing well or get worse. Document Released: 07/19/2011 Document Revised: 08/21/2012 Document Reviewed: 07/19/2011 [facility] Patient Information 2014 [place].

## 2013-05-14 NOTE — Progress Notes (Signed)
Patient ID: Janet Choi, female   DOB: February 18, 1941, 73 y.o.   MRN: [id number]   This very nice 73 y.o. Morbidly Obese DWF with long hx/o poor compliance who presents for 3 month follow up with Hypertension, Hyperlipidemia, Diabetes and Vitamin D Deficiency.    HTN predates since 1997. BP is not checked at home. Today's BP: 148/88 mmHg. She had a negative cardiolyte and 2DEC in 2011.  In Sept her BUN/Creat 23/1.27 and calc GFR 42 in moderate renal insufficiency range. Patient denies any cardiac type chest pain, palpitations, dyspnea/orthopnea/PND, dizziness, claudication, or dependent edema.   Hyperlipidemia is controlled with diet & meds. Last Cholesterol was 360, Triglycerides were 682 and HDL 40 LDL was not measurable in Sept. She was advised to restart her Gemfibrozil which she never did.. Patient denies myalgias or other med SE's.    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 Patient denies any symptoms of reactive hypoglycemia, diabetic polys or visual blurring. She does have hx/o burning paresthesias of the feet.    Further, Patient has history of Vitamin D Deficiency with level of 25 in 2008 with last vitamin D of 48 in Sept.. Patient supplements vitamin D without any suspected side-effects.  Medication Sig Dispense Refill  . amitriptyline (ELAVIL) 25 MG tablet Take 25 mg by mouth 3 (three) times daily.      . Cholecalciferol (VITAMIN D) 2000 UNITS tablet Take 2,000 Units by mouth daily.      . clonazePAM (KLONOPIN) 1 MG tablet Take 1 mg by mouth at bedtime as needed for anxiety.      . Dapagliflozin Propanediol (FARXIGA) 10 MG TABS Take 10 mg by mouth.      . diazepam (VALIUM) 5 MG tablet Take 5 mg by mouth at bedtime as needed for anxiety.      Marland Kitchen FLUoxetine (PROZAC) 40 MG capsule Take 40 mg by mouth daily.      . furosemide (LASIX) 40 MG tablet Take 20 mg  by mouth daily.      Marland Kitchen gemfibrozil (LOPID) 600 MG tablet Take 600 mg by mouth 2 (two) times daily.      Marland Kitchen glimepiride (AMARYL) 4 MG tablet Take 4 mg by mouth 2 (two) times daily.      Marland Kitchen HYDROcodone-acetaminophen (NORCO/VICODIN) 5-325 MG per tablet Take 1 tablet by mouth every 6 (six) hours as needed for pain.      . hydrOXYzine (ATARAX/VISTARIL) 25 MG tablet Take 1 tablet (25 mg total) by mouth 3 (three) times daily as needed for itching.  30 tablet  0  . IRON, FERROUS GLUCONATE, PO Take 45 mg by mouth.      . losartan (COZAAR) 50 MG tablet Take 50 mg by mouth daily.      . meloxicam (MOBIC) 15 MG tablet Take 15 mg by mouth daily.      . metFORMIN (GLUCOPHAGE) 500 MG tablet Take 500 mg by mouth 2 (two) times daily with a meal.      . rOPINIRole (REQUIP) 1 MG tablet Take 1 tablet (1 mg total) by mouth 4 (four) times daily.  120 tablet  3  . verapamil (COVERA HS) 180 MG (CO) 24 hr tablet Take 180 mg by mouth at bedtime.          Allergen Reactions  . Aspirin   . Crestor [Rosuvastatin]   .  Lipitor [Atorvastatin]   . Lyrica [Pregabalin] Other (See Comments)    Blurred vision  . Minocycline Hcl     REACTION: Reaction not known  . Oruvail [Ketoprofen]   . Tetracyclines & Related   . Tricor [Fenofibrate]   . Vasotec [Enalaprilat]   . Victoza [Liraglutide]   . Zinc     PMHx:   Past Medical History  Diagnosis Date  . Diabetes mellitus without complication   . Hypertension   . Hyperlipidemia   . OSA (obstructive sleep apnea)   . DJD (degenerative joint disease)   . RLS (restless legs syndrome)   . Obesity   . Vitamin D deficiency     FHx:    Reviewed / unchanged  SHx:    Reviewed / unchanged  Systems Review: Constitutional: Denies fever, chills, wt changes, headaches, insomnia, fatigue, night sweats, change in appetite. Eyes: Denies redness, blurred vision, diplopia, discharge, itchy, watery eyes.  ENT: Denies discharge, congestion, post nasal drip, epistaxis, sore throat,  earache, hearing loss, dental pain, tinnitus, vertigo, sinus pain, snoring.  CV: Denies chest pain, palpitations, irregular heartbeat, syncope, dyspnea, diaphoresis, orthopnea, PND, claudication, edema. Respiratory: denies cough, dyspnea, DOE, pleurisy, hoarseness, laryngitis, wheezing.  Gastrointestinal: Denies dysphagia, odynophagia, heartburn, reflux, water brash, abdominal pain or cramps, nausea, vomiting, bloating, diarrhea, constipation, hematemesis, melena, hematochezia,  or hemorrhoids. Genitourinary: Denies dysuria, frequency, urgency, nocturia, hesitancy, discharge, hematuria, flank pain. Musculoskeletal: Denies arthralgias, myalgias, stiffness, jt. swelling, pain, limp, strain/sprain.  Skin: Denies pruritus, rash, hives, warts, acne, eczema, change in skin lesion(s). Neuro: No weakness, tremor, incoordination, spasms, paresthesia, or pain. Psychiatric: Denies confusion, memory loss, or sensory loss. Endo: Denies change in weight, skin, hair change.  Heme/Lymph: No excessive bleeding, bruising, orenlarged lymph nodes.  BP: 148/88  Pulse: 92  Temp: 98.2 F (36.8 C)  Resp: 18    body mass index is 39.87 kg/(m^2)   Height     5\' 3"  (1.6 m).   Weight    225 lb   On Exam: Appears well nourished - in no distress. Eyes: PERRLA, EOMs, conjunctiva no swelling or erythema. Sinuses: No frontal/maxillary tenderness ENT/Mouth: EAC's clear, TM's nl w/o erythema, bulging. Nares clear w/o erythema, swelling, exudates. Oropharynx clear without erythema or exudates. Oral hygiene is good. Tongue normal, non obstructing. Hearing intact.  Neck: Supple. Thyroid nl. Car 2+/2+ without bruits, nodes or JVD. Chest: Respirations nl with BS clear & equal w/o rales, rhonchi, wheezing or stridor.  Cor: Heart sounds normal w/ regular rate and rhythm without sig. murmurs, gallops, clicks, or rubs. Peripheral pulses normal and equal  without edema.  Abdomen: Morbidly redundant paniculus.  Lymphatics:  Unremarkable.  Musculoskeletal: Full ROM all peripheral extremities, joint stability, 5/5 strength, and normal gait.  Skin: Warm, dry without exposed rashes, lesions, ecchymosis apparent.  Neuro: Cranial nerves intact, reflexes equal bilaterally. Sensory-motor testing grossly intact. Tendon reflexes grossly intact.  Pysch: Alert & oriented x 3. Insight and judgement nl & appropriate. No ideations.  Assessment and Plan:  1. Hypertension - Continue monitor blood pressure at home. Continue diet/meds same.  2. Hyperlipidemia - Continue diet/meds, exercise,& lifestyle modifications. Continue monitor periodic cholesterol/liver & renal functions   3. T2 NIDDM - Continue diet, meds, exercise, lifestyle modifications. Monitor appropriate labs. Strongly encouraged weight loss.  4. Vitamin D Deficiency - Continue supplementation.  5. Morbid Obesity  6. OSA and RLS  7. DJD   Recommended regular exercise, BP monitoring, weight control, and discussed med and SE's. Recommended labs to assess and  monitor clinical status. Further disposition pending results of labs.

## 2013-05-15 ENCOUNTER — Telehealth: Payer: Self-pay | Admitting: *Deleted

## 2013-05-15 LAB — HEMOGLOBIN A1C
HEMOGLOBIN A1C: 10.8 % — AB (ref <5.7)
Mean Plasma Glucose: 263 mg/dL — ABNORMAL HIGH (ref <117)

## 2013-05-15 LAB — VITAMIN D 25 HYDROXY (VIT D DEFICIENCY, FRACTURES): VIT D 25 HYDROXY: 51 ng/mL (ref 30–89)

## 2013-05-15 LAB — INSULIN, FASTING: Insulin fasting, serum: 14 u[IU]/mL (ref 3–28)

## 2013-05-15 LAB — TSH: TSH: 1.15 u[IU]/mL (ref 0.350–4.500)

## 2013-05-15 NOTE — Telephone Encounter (Signed)
Pt aware of lab results 

## 2013-05-31 ENCOUNTER — Other Ambulatory Visit: Payer: Self-pay | Admitting: Internal Medicine

## 2013-05-31 DIAGNOSIS — L299 Pruritus, unspecified: Secondary | ICD-10-CM

## 2013-06-01 MED ORDER — CLONAZEPAM 1 MG PO TABS: 1.0000 mg | ORAL_TABLET | Freq: Every evening | ORAL | Status: DC | PRN

## 2013-06-01 MED ORDER — METFORMIN HCL 500 MG PO TABS: 500.0000 mg | ORAL_TABLET | Freq: Two times a day (BID) | ORAL | Status: DC

## 2013-06-01 MED ORDER — HYDROXYZINE HCL 25 MG PO TABS: 25.0000 mg | ORAL_TABLET | Freq: Three times a day (TID) | ORAL | Status: DC | PRN

## 2013-06-01 MED ORDER — LOSARTAN POTASSIUM 50 MG PO TABS: 50.0000 mg | ORAL_TABLET | Freq: Every day | ORAL | Status: DC

## 2013-06-01 MED ORDER — VERAPAMIL HCL 180 MG (CO) PO TB24: 180.0000 mg | ORAL_TABLET | Freq: Every day | ORAL | Status: DC

## 2013-06-01 MED ORDER — AMITRIPTYLINE HCL 25 MG PO TABS: 25.0000 mg | ORAL_TABLET | Freq: Three times a day (TID) | ORAL | Status: DC

## 2013-06-01 MED ORDER — FLUOXETINE HCL 40 MG PO CAPS: 40.0000 mg | ORAL_CAPSULE | Freq: Every day | ORAL | Status: DC

## 2013-06-01 MED ORDER — ROPINIROLE HCL 1 MG PO TABS: 1.0000 mg | ORAL_TABLET | Freq: Four times a day (QID) | ORAL | Status: DC

## 2013-06-01 MED ORDER — FUROSEMIDE 40 MG PO TABS: 20.0000 mg | ORAL_TABLET | Freq: Every day | ORAL | Status: DC

## 2013-06-01 MED ORDER — HYDROCODONE-ACETAMINOPHEN 5-325 MG PO TABS: 1.0000 | ORAL_TABLET | Freq: Four times a day (QID) | ORAL | Status: DC | PRN

## 2013-06-01 MED ORDER — GLIMEPIRIDE 4 MG PO TABS: 4.0000 mg | ORAL_TABLET | Freq: Two times a day (BID) | ORAL | Status: DC

## 2013-06-01 MED ORDER — MELOXICAM 15 MG PO TABS: 15.0000 mg | ORAL_TABLET | Freq: Every day | ORAL | Status: DC

## 2013-06-01 MED ORDER — GEMFIBROZIL 600 MG PO TABS: 600.0000 mg | ORAL_TABLET | Freq: Two times a day (BID) | ORAL | Status: DC

## 2013-06-13 ENCOUNTER — Other Ambulatory Visit: Payer: Self-pay | Admitting: Physician Assistant

## 2013-07-09 ENCOUNTER — Other Ambulatory Visit: Payer: Self-pay | Admitting: Internal Medicine

## 2013-07-10 ENCOUNTER — Telehealth: Payer: Self-pay | Admitting: *Deleted

## 2013-07-10 NOTE — Telephone Encounter (Signed)
Patient called and requested refill on Phenergan with Codeine.  Per Dr Melford Aase, try OTC Delsym.  Left message on her machine to inform her.

## 2013-07-10 NOTE — Telephone Encounter (Signed)
Left message to OTC Delsym for cough.

## 2013-07-30 ENCOUNTER — Other Ambulatory Visit: Payer: Self-pay | Admitting: Internal Medicine

## 2013-07-30 MED ORDER — CLONAZEPAM 1 MG PO TABS: 1.0000 mg | ORAL_TABLET | Freq: Every evening | ORAL | Status: DC | PRN

## 2013-08-17 ENCOUNTER — Ambulatory Visit: Payer: Self-pay | Admitting: Emergency Medicine

## 2013-08-27 ENCOUNTER — Encounter: Payer: Self-pay | Admitting: Emergency Medicine

## 2013-08-27 ENCOUNTER — Ambulatory Visit (INDEPENDENT_AMBULATORY_CARE_PROVIDER_SITE_OTHER): Payer: Commercial Managed Care - HMO | Admitting: Emergency Medicine

## 2013-08-27 VITALS — BP 130/66 | HR 90 | Temp 98.6°F | Resp 18 | Ht 63.25 in | Wt 216.0 lb

## 2013-08-27 DIAGNOSIS — E782 Mixed hyperlipidemia: Secondary | ICD-10-CM

## 2013-08-27 DIAGNOSIS — R319 Hematuria, unspecified: Secondary | ICD-10-CM

## 2013-08-27 DIAGNOSIS — I10 Essential (primary) hypertension: Secondary | ICD-10-CM

## 2013-08-27 DIAGNOSIS — E1149 Type 2 diabetes mellitus with other diabetic neurological complication: Secondary | ICD-10-CM

## 2013-08-27 DIAGNOSIS — E669 Obesity, unspecified: Secondary | ICD-10-CM

## 2013-08-27 LAB — CBC WITH DIFFERENTIAL/PLATELET
Basophils Absolute: 0 10*3/uL (ref 0.0–0.1)
Basophils Relative: 0 % (ref 0–1)
Eosinophils Absolute: 0.2 10*3/uL (ref 0.0–0.7)
Eosinophils Relative: 5 % (ref 0–5)
HEMATOCRIT: 37.8 % (ref 36.0–46.0)
Hemoglobin: 12.6 g/dL (ref 12.0–15.0)
LYMPHS ABS: 1.3 10*3/uL (ref 0.7–4.0)
LYMPHS PCT: 27 % (ref 12–46)
MCH: 28.9 pg (ref 26.0–34.0)
MCHC: 33.3 g/dL (ref 30.0–36.0)
MCV: 86.7 fL (ref 78.0–100.0)
MONO ABS: 0.3 10*3/uL (ref 0.1–1.0)
Monocytes Relative: 6 % (ref 3–12)
NEUTROS ABS: 3 10*3/uL (ref 1.7–7.7)
Neutrophils Relative %: 62 % (ref 43–77)
Platelets: 280 10*3/uL (ref 150–400)
RBC: 4.36 MIL/uL (ref 3.87–5.11)
RDW: 12.7 % (ref 11.5–15.5)
WBC: 4.9 10*3/uL (ref 4.0–10.5)

## 2013-08-27 LAB — HEMOGLOBIN A1C
Hgb A1c MFr Bld: 11.7 % — ABNORMAL HIGH (ref <5.7)
Mean Plasma Glucose: 289 mg/dL — ABNORMAL HIGH (ref <117)

## 2013-08-27 MED ORDER — VERAPAMIL HCL 180 MG (CO) PO TB24: 180.0000 mg | ORAL_TABLET | Freq: Every day | ORAL | Status: DC

## 2013-08-27 MED ORDER — METFORMIN HCL 1000 MG PO TABS
1000.0000 mg | ORAL_TABLET | Freq: Two times a day (BID) | ORAL | Status: DC
Start: 2013-08-27 — End: 2014-11-18

## 2013-08-27 NOTE — Patient Instructions (Signed)
Diabetic Neuropathy Diabetic neuropathy is a nerve disease or nerve damage that is caused by diabetes mellitus. About half of all people with diabetes mellitus have some form of nerve damage. Nerve damage is more common in those who have had diabetes mellitus for many years and who generally have not had good control of their blood sugar (glucose) level. Diabetic neuropathy is a common complication of diabetes mellitus. There are three more common types of diabetic neuropathy and a fourth type that is less common and less understood:   Peripheral neuropathy This is the most common type of diabetic neuropathy. It causes damage to the nerves of the feet and legs first and then eventually the hands and arms.The damage affects the ability to sense touch.  Autonomic neuropathy This type causes damage to the autonomic nervous system, which controls the following functions:  Heartbeat.  Body temperature.  Blood pressure.  Urination.  Digestion.  Sweating.  Sexual function.  Focal neuropathy Focal neuropathy can be painful and unpredictable and occurs most often in older adults with diabetes mellitus. It involves a specific nerve or one area and often comes on suddenly. It usually does not cause long-term problems.  Radiculoplexus neuropathy Sometimes called lumbosacral radiculoplexus neuropathy, radiculoplexus neuropathy affects the nerves of the thighs, hips, buttocks, or legs. It is more common in people with type 2 diabetes mellitus and in older men. It is characterized by debilitating pain, weakness, and atrophy, usually in the thigh muscles. CAUSES  The cause of peripheral, autonomic, and focal neuropathies is diabetes mellitus that is uncontrolled and high glucose levels. The cause of radiculoplexus neuropathy is unknown. However, it is thought to be caused by inflammation related to uncontrolled glucose levels. SIGNS AND SYMPTOMS  Peripheral Neuropathy Peripheral neuropathy develops  slowly over time. When the nerves of the feet and legs no longer work there may be:   Burning, stabbing, or aching pain in the legs or feet.  Inability to feel pressure or pain in your feet. This can lead to:  Thick calluses over pressure areas.  Pressure sores.  Ulcers.  Foot deformities.  Reduced ability to feel temperature changes.  Muscle weakness. Autonomic Neuropathy The symptoms of autonomic neuropathy vary depending on which nerves are affected. Symptoms may include:  Problems with digestion, such as:  Feeling sick to your stomach (nausea).  Vomiting.  Bloating.  Constipation.  Diarrhea.  Abdominal pain.  Difficulty with urination. This occurs if you lose your ability to sense when your bladder is full. Problems include:  Urine leakage (incontinence).  Inability to empty your bladder completely (retention).  Rapid or irregular heartbeat (palpitations).  Blood pressure drops when you stand up (orthostatic hypotension). When you stand up you may feel:  Dizzy.  Weak.  Faint.  In men, inability to attain and maintain an erection.  In women, vaginal dryness and problems with decreased sexual desire and arousal.  Problems with body temperature regulation.  Increased or decreased sweating. Focal Neuropathy  Abnormal eye movements or abnormal alignment of both eyes.  Weakness in the wrist.  Foot drop. This results in an inability to lift the foot properly and abnormal walking or foot movement.  Paralysis on one side of your face (Bell palsy).  Chest or abdominal pain. Radiculoplexus Neuropathy  Sudden, severe pain in your hip, thigh, or buttocks.  Weakness and wasting of thigh muscles.  Difficulty rising from a seated position.  Abdominal swelling.  Unexplained weight loss (usually more than 10 lb [4.5 kg]). DIAGNOSIS  Peripheral Neuropathy   Your senses may be tested. Sensory function testing can be done with:  A light touch using a  monofilament.  A vibration with tuning fork.  A sharp sensation with a pin prick. Other tests that can help diagnose neuropathy are:  Nerve conduction velocity. This test checks the transmission of an electrical current through a nerve.  Electromyography. This shows how muscles respond to electrical signals transmitted by nearby nerves.  Quantitative sensory testing. This is used to assess how your nerves respond to vibrations and changes in temperature. Autonomic Neuropathy Diagnosis is often based on reported symptoms. Tell your health care provider if you experience:   Dizziness.   Constipation.   Diarrhea.   Inappropriate urination or inability to urinate.   Inability to get or maintain an erection.  Tests that may be done include:   Electrocardiography or Holter monitor. These are tests that can help show problems with the heart rate or heart rhythm.   An X-ray exam may be done. Focal Neuropathy Diagnosis is made based on your symptoms and what your health care provider finds during your exam. Other tests may be done. They may include:  Nerve conduction velocities. This checks the transmission of electrical current through a nerve.  Electromyography. This shows how muscles respond to electrical signals transmitted by nearby nerves.  Quantitative sensory testing. This test is used to assess how your nerves respond to vibration and changes in temperature. Radiculoplexus Neuropathy  Often the first thing is to eliminate any other issue or problems that might be the cause, as there is no stick test for diagnosis.  X-ray exam of your spine and lumbar region.  Spinal tap to rule out cancer.  MRI to rule out other lesions. TREATMENT  Once nerve damage occurs, it cannot be reversed. The goal of treatment is to keep the disease or nerve damage from getting worse and affecting more nerve fibers. Controlling your blood glucose level is the key. Most people with  radiculoplexus neuropathy see at least a partial improvement over time. You will need to keep your blood glucose and HbA1c levels in the target range determined by your health care provider. Things that help control blood glucose levels include:   Blood glucose monitoring.   Meal planning.   Physical activity.   Diabetes medicine.  Over time, maintaining lower blood glucose levels helps lessen symptoms. Sometimes, prescription pain medicine is needed. HOME CARE INSTRUCTIONS:  Do not smoke.  Keep your blood glucose level in the range that you and your health care provider have determined acceptable for you.  Keep your blood pressure level in the range that you and your health care provider have determined acceptable for you.  Eat a well-balanced diet.  Be active every day.  Check your feet every day. SEEK MEDICAL CARE IF:   You have burning, stabbing, or aching pain in the legs or feet.  You are unable to feel pressure or pain in your feet.  You develop problems with digestion such as:  Nausea.  Vomiting.  Bloating.  Constipation.  Diarrhea.  Abdominal pain.  You have difficulty with urination, such as:  Incontinence.  Retention.  You have palpitations.  You develop orthostatic hypotension. When you stand up you may feel:  Dizzy.  Weak.  Faint.  You cannot attain and maintain an erection (in men).  You have vaginal dryness and problems with decreased sexual desire and arousal (in women).  You have severe pain in your thighs, legs, or buttocks.  You have   unexplained weight loss. Document Released: 07/05/2001 Document Revised: 02/14/2013 Document Reviewed: 10/05/2012 [facility] Patient Information 2014 [place]. Diabetes and Foot Care Diabetes may cause you to have problems because of poor blood supply (circulation) to your feet and legs. This may cause the skin on your feet to become thinner, break easier, and heal more slowly. Your skin may  become dry, and the skin may peel and crack. You may also have nerve damage in your legs and feet causing decreased feeling in them. You may not notice minor injuries to your feet that could lead to infections or more serious problems. Taking care of your feet is one of the most important things you can do for yourself.  HOME CARE INSTRUCTIONS  Wear shoes at all times, even in the house. Do not go barefoot. Bare feet are easily injured.  Check your feet daily for blisters, cuts, and redness. If you cannot see the bottom of your feet, use a mirror or ask someone for help.  Wash your feet with warm water (do not use hot water) and mild soap. Then pat your feet and the areas between your toes until they are completely dry. Do not soak your feet as this can dry your skin.  Apply a moisturizing lotion or petroleum jelly (that does not contain alcohol and is unscented) to the skin on your feet and to dry, brittle toenails. Do not apply lotion between your toes.  Trim your toenails straight across. Do not dig under them or around the cuticle. File the edges of your nails with an emery board or nail file.  Do not cut corns or calluses or try to remove them with medicine.  Wear clean socks or stockings every day. Make sure they are not too tight. Do not wear knee-high stockings since they may decrease blood flow to your legs.  Wear shoes that fit properly and have enough cushioning. To break in new shoes, wear them for just a few hours a day. This prevents you from injuring your feet. Always look in your shoes before you put them on to be sure there are no objects inside.  Do not cross your legs. This may decrease the blood flow to your feet.  If you find a minor scrape, cut, or break in the skin on your feet, keep it and the skin around it clean and dry. These areas may be cleansed with mild soap and water. Do not cleanse the area with peroxide, alcohol, or iodine.  When you remove an adhesive  bandage, be sure not to damage the skin around it.  If you have a wound, look at it several times a day to make sure it is healing.  Do not use heating pads or hot water bottles. They may burn your skin. If you have lost feeling in your feet or legs, you may not know it is happening until it is too late.  Make sure your health care provider performs a complete foot exam at least annually or more often if you have foot problems. Report any cuts, sores, or bruises to your health care provider immediately. SEEK MEDICAL CARE IF:   You have an injury that is not healing.  You have cuts or breaks in the skin.  You have an ingrown nail.  You notice redness on your legs or feet.  You feel burning or tingling in your legs or feet.  You have pain or cramps in your legs and feet.  Your legs or feet  are numb.  Your feet always feel cold. SEEK IMMEDIATE MEDICAL CARE IF:   There is increasing redness, swelling, or pain in or around a wound.  There is a red line that goes up your leg.  Pus is coming from a wound.  You develop a fever or as directed by your health care provider.  You notice a bad smell coming from an ulcer or wound. Document Released: 04/23/2000 Document Revised: 12/27/2012 Document Reviewed: 10/03/2012 [facility] Patient Information 2014 [place].

## 2013-08-27 NOTE — Progress Notes (Signed)
Subjective:    Patient ID: Janet Choi, female    DOB: June 26, 1940, 73 y.o.   MRN: [id number]  HPI Comments: 73 yo OBESE WF presents for 3 month F/U for HTN, Cholesterol, DM, D. deficient She has been out of Metformin x 2 weeks and BS has been in the 400s. She notes today it read high. She notes she restarted MF 1000 mg this a.m. She is trying to eat better with decreased meat and increased veggies. She notes BP has been good eventhough out of Verapamil x 2 weeks. She has been exercising a little. She stopped [place] in Dec due to blood in urine but it helped with weight and BS. She notes blood in urine improved with d/c. She checks feet routinely and denies skin break down or neuropathy increase. She notes mild neuropathy at night only that has been stable. She is down 9#. She has started to walk for exercise. Last labs A1c 10.8 T 335 Tg 535  Diabetes  Hyperlipidemia  Hypertension     Medication List       This list is accurate as of: 08/27/13  2:19 PM.  Always use your most recent med list.               amitriptyline 25 MG tablet  Commonly known as:  ELAVIL  Take 1 tablet (25 mg total) by mouth 3 (three) times daily.     clonazePAM 1 MG tablet  Commonly known as:  KLONOPIN  Take 1 tablet (1 mg total) by mouth at bedtime as needed for anxiety.     cyanocobalamin 1000 MCG/ML injection  Commonly known as:  (VITAMIN B-12)  Inject 1,000 mcg into the muscle every 30 (thirty) days.     FLUoxetine 40 MG capsule  Commonly known as:  PROZAC  Take 1 capsule (40 mg total) by mouth daily.     furosemide 40 MG tablet  Commonly known as:  LASIX  Take 0.5 tablets (20 mg total) by mouth daily.     gemfibrozil 600 MG tablet  Commonly known as:  LOPID  Take 1 tablet (600 mg total) by mouth 2 (two) times daily.     glimepiride 4 MG tablet  Commonly known as:  AMARYL  Take 1 tablet (4 mg total) by mouth 2 (two) times daily.     HYDROcodone-acetaminophen 5-325 MG per tablet   Commonly known as:  NORCO/VICODIN  Take 1 tablet by mouth every 6 (six) hours as needed.     hydrOXYzine 25 MG tablet  Commonly known as:  ATARAX/VISTARIL  Take 1 tablet (25 mg total) by mouth 3 (three) times daily as needed for itching.     IRON (FERROUS GLUCONATE) PO  Take 45 mg by mouth.     losartan 50 MG tablet  Commonly known as:  COZAAR  Take 1 tablet (50 mg total) by mouth daily.     meloxicam 15 MG tablet  Commonly known as:  MOBIC  Take 1 tablet (15 mg total) by mouth daily.     metFORMIN 1000 MG tablet  Commonly known as:  GLUCOPHAGE  Take 1 tablet (1,000 mg total) by mouth 2 (two) times daily with a meal.     rOPINIRole 1 MG tablet  Commonly known as:  REQUIP  Take 1 tablet (1 mg total) by mouth 4 (four) times daily.     verapamil 180 MG (CO) 24 hr tablet  Commonly known as:  COVERA HS  Take 1 tablet (180  mg total) by mouth at bedtime.     Vitamin D 2000 UNITS tablet  Take 2,000 Units by mouth daily.       Allergies  Allergen Reactions  . Aspirin   . Crestor [Rosuvastatin]   . Lipitor [Atorvastatin]   . Lyrica [Pregabalin] Other (See Comments)    Blurred vision  . Minocycline Hcl     REACTION: Reaction not known  . Oruvail [Ketoprofen]   . Tetracyclines & Related   . Tricor [Fenofibrate]   . Vasotec [Enalaprilat]   . Victoza [Liraglutide]   . Zinc    Past Medical History  Diagnosis Date  . Diabetes mellitus without complication   . Hypertension   . Hyperlipidemia   . OSA (obstructive sleep apnea)   . DJD (degenerative joint disease)   . RLS (restless legs syndrome)   . Obesity   . Vitamin D deficiency       Review of Systems  All other systems reviewed and are negative.  BP 130/66  Pulse 90  Temp(Src) 98.6 F (37 C) (Temporal)  Resp 18  Ht 5' 3.25" (1.607 m)  Wt 216 lb (97.977 kg)  BMI 37.94 kg/m2     Objective:   Physical Exam  Nursing note and vitals reviewed. Constitutional: She is oriented to person, place, and time.  She appears well-developed and well-nourished. No distress.  Obese  HENT:  Head: Normocephalic and atraumatic.  Right Ear: External ear normal.  Left Ear: External ear normal.  Nose: Nose normal.  Mouth/Throat: Oropharynx is clear and moist.  Eyes: Conjunctivae and EOM are normal.  Neck: Normal range of motion. Neck supple. No JVD present. No thyromegaly present.  Cardiovascular: Normal rate, regular rhythm, normal heart sounds and intact distal pulses.   Pulmonary/Chest: Effort normal and breath sounds normal.  Abdominal: Soft. Bowel sounds are normal. She exhibits no distension and no mass. There is no tenderness. There is no rebound and no guarding.  Musculoskeletal: Normal range of motion. She exhibits no edema and no tenderness.  Lymphadenopathy:    She has no cervical adenopathy.  Neurological: She is alert and oriented to person, place, and time. No cranial nerve deficit.  Skin: Skin is warm and dry. No rash noted. No erythema. No pallor.  Psychiatric: She has a normal mood and affect. Her behavior is normal. Judgment and thought content normal.     Diabetic foot exam performed and documented     Assessment & Plan:  1.  3 month F/U for OBESITY, HTN, Cholesterol,DM, D. Deficient. Needs healthy diet, cardio QD and obtain healthy weight. Check Labs, Check BP if >130/80 call office, Check BS if >200 call office Hold Invokana until urine lab complete, if negative can start Sx.IF + blood ref urology with smoking history.  2. Hematuria- ? Farxiga vs history of tobacco use in past- recheck labs

## 2013-08-28 LAB — URINALYSIS, ROUTINE W REFLEX MICROSCOPIC
BILIRUBIN URINE: NEGATIVE
Hgb urine dipstick: NEGATIVE
KETONES UR: NEGATIVE mg/dL
Leukocytes, UA: NEGATIVE
Nitrite: NEGATIVE
Protein, ur: NEGATIVE mg/dL
SPECIFIC GRAVITY, URINE: 1.013 (ref 1.005–1.030)
UROBILINOGEN UA: 0.2 mg/dL (ref 0.0–1.0)
pH: 5 (ref 5.0–8.0)

## 2013-08-28 LAB — BASIC METABOLIC PANEL WITH GFR
BUN: 27 mg/dL — AB (ref 6–23)
CHLORIDE: 95 meq/L — AB (ref 96–112)
CO2: 22 meq/L (ref 19–32)
CREATININE: 1.37 mg/dL — AB (ref 0.50–1.10)
Calcium: 9.9 mg/dL (ref 8.4–10.5)
GFR, Est African American: 44 mL/min — ABNORMAL LOW
GFR, Est Non African American: 39 mL/min — ABNORMAL LOW
GLUCOSE: 443 mg/dL — AB (ref 70–99)
Potassium: 4.6 mEq/L (ref 3.5–5.3)
Sodium: 133 mEq/L — ABNORMAL LOW (ref 135–145)

## 2013-08-28 LAB — LIPID PANEL
Cholesterol: 382 mg/dL — ABNORMAL HIGH (ref 0–200)
HDL: 36 mg/dL — ABNORMAL LOW (ref >39)
TRIGLYCERIDES: 763 mg/dL — AB (ref <150)
Total CHOL/HDL Ratio: 10.6 Ratio

## 2013-08-28 LAB — URINALYSIS, MICROSCOPIC ONLY
BACTERIA UA: NONE SEEN
CRYSTALS: NONE SEEN
Casts: NONE SEEN
Squamous Epithelial / LPF: NONE SEEN

## 2013-08-28 LAB — INSULIN, FASTING: Insulin fasting, serum: 22 u[IU]/mL (ref 3–28)

## 2013-08-28 LAB — HEPATIC FUNCTION PANEL
ALBUMIN: 4.5 g/dL (ref 3.5–5.2)
ALT: 15 U/L (ref 0–35)
AST: 17 U/L (ref 0–37)
Alkaline Phosphatase: 110 U/L (ref 39–117)
BILIRUBIN DIRECT: 0.1 mg/dL (ref 0.0–0.3)
Indirect Bilirubin: 0.3 mg/dL (ref 0.2–1.2)
TOTAL PROTEIN: 6.9 g/dL (ref 6.0–8.3)
Total Bilirubin: 0.4 mg/dL (ref 0.2–1.2)

## 2013-09-12 ENCOUNTER — Ambulatory Visit (INDEPENDENT_AMBULATORY_CARE_PROVIDER_SITE_OTHER): Payer: Commercial Managed Care - HMO | Admitting: Internal Medicine

## 2013-09-12 ENCOUNTER — Encounter: Payer: Self-pay | Admitting: Internal Medicine

## 2013-09-12 VITALS — BP 124/76 | HR 88 | Temp 98.1°F | Resp 16 | Ht 63.25 in | Wt 214.0 lb

## 2013-09-12 DIAGNOSIS — I10 Essential (primary) hypertension: Secondary | ICD-10-CM

## 2013-09-12 DIAGNOSIS — E1129 Type 2 diabetes mellitus with other diabetic kidney complication: Secondary | ICD-10-CM

## 2013-09-12 DIAGNOSIS — Z79899 Other long term (current) drug therapy: Secondary | ICD-10-CM | POA: Insufficient documentation

## 2013-09-12 DIAGNOSIS — E782 Mixed hyperlipidemia: Secondary | ICD-10-CM

## 2013-09-12 MED ORDER — ATORVASTATIN CALCIUM 80 MG PO TABS: ORAL_TABLET | ORAL | Status: DC

## 2013-09-12 MED ORDER — CANAGLIFLOZIN 300 MG PO TABS: 300.0000 mg | ORAL_TABLET | Freq: Every day | ORAL | Status: DC

## 2013-09-12 NOTE — Patient Instructions (Signed)
Diabetes and Exercise Exercising regularly is important. It is not just about losing weight. It has many health benefits, such as:  Improving your overall fitness, flexibility, and endurance.  Increasing your bone density.  Helping with weight control.  Decreasing your body fat.  Increasing your muscle strength.  Reducing stress and tension.  Improving your overall health. People with diabetes who exercise gain additional benefits because exercise:  Reduces appetite.  Improves the body's use of blood sugar (glucose).  Helps lower or control blood glucose.  Decreases blood pressure.  Helps control blood lipids (such as cholesterol and triglycerides).  Improves the body's use of the hormone insulin by:  Increasing the body's insulin sensitivity.  Reducing the body's insulin needs.  Decreases the risk for heart disease because exercising:  Lowers cholesterol and triglycerides levels.  Increases the levels of good cholesterol (such as high-density lipoproteins [HDL]) in the body.  Lowers blood glucose levels. YOUR ACTIVITY PLAN  Choose an activity that you enjoy and set realistic goals. Your health care provider or diabetes educator can help you make an activity plan that works for you. You can break activities into 2 or 3 sessions throughout the day. Doing so is as good as one long session. Exercise ideas include:  Taking the dog for a walk.  Taking the stairs instead of the elevator.  Dancing to your favorite song.  Doing your favorite exercise with a friend. RECOMMENDATIONS FOR EXERCISING WITH TYPE 1 OR TYPE 2 DIABETES   Check your blood glucose before exercising. If blood glucose levels are greater than 240 mg/dL, check for urine ketones. Do not exercise if ketones are present.  Avoid injecting insulin into areas of the body that are going to be exercised. For example, avoid injecting insulin into:  The arms when playing tennis.  The legs when  jogging.  Keep a record of:  Food intake before and after you exercise.  Expected peak times of insulin action.  Blood glucose levels before and after you exercise.  The type and amount of exercise you have done.  Review your records with your health care provider. Your health care provider will help you to develop guidelines for adjusting food intake and insulin amounts before and after exercising.  If you take insulin or oral hypoglycemic agents, watch for signs and symptoms of hypoglycemia. They include:  Dizziness.  Shaking.  Sweating.  Chills.  Confusion.  Drink plenty of water while you exercise to prevent dehydration or heat stroke. Body water is lost during exercise and must be replaced.  Talk to your health care provider before starting an exercise program to make sure it is safe for you. Remember, almost any type of activity is better than none. Document Released: 07/17/2003 Document Revised: 12/27/2012 Document Reviewed: 10/03/2012 [facility] Patient Information 2014 [place]. Diabetes and Exercise Exercising regularly is important. It is not just about losing weight. It has many health benefits, such as:  Improving your overall fitness, flexibility, and endurance.  Increasing your bone density.  Helping with weight control.  Decreasing your body fat.  Increasing your muscle strength.  Reducing stress and tension.  Improving your overall health. People with diabetes who exercise gain additional benefits because exercise:  Reduces appetite.  Improves the body's use of blood sugar (glucose).  Helps lower or control blood glucose.  Decreases blood pressure.  Helps control blood lipids (such as cholesterol and triglycerides).  Improves the body's use of the hormone insulin by:  Increasing the body's insulin sensitivity.  Reducing the body's insulin needs.  Decreases the risk for heart disease because exercising:  Lowers cholesterol and  triglycerides levels.  Increases the levels of good cholesterol (such as high-density lipoproteins [HDL]) in the body.  Lowers blood glucose levels. YOUR ACTIVITY PLAN  Choose an activity that you enjoy and set realistic goals. Your health care provider or diabetes educator can help you make an activity plan that works for you. You can break activities into 2 or 3 sessions throughout the day. Doing so is as good as one long session. Exercise ideas include:  Taking the dog for a walk.  Taking the stairs instead of the elevator.  Dancing to your favorite song.  Doing your favorite exercise with a friend. RECOMMENDATIONS FOR EXERCISING WITH TYPE 1 OR TYPE 2 DIABETES   Check your blood glucose before exercising. If blood glucose levels are greater than 240 mg/dL, check for urine ketones. Do not exercise if ketones are present.  Avoid injecting insulin into areas of the body that are going to be exercised. For example, avoid injecting insulin into:  The arms when playing tennis.  The legs when jogging.  Keep a record of:  Food intake before and after you exercise.  Expected peak times of insulin action.  Blood glucose levels before and after you exercise.  The type and amount of exercise you have done.  Review your records with your health care provider. Your health care provider will help you to develop guidelines for adjusting food intake and insulin amounts before and after exercising.  If you take insulin or oral hypoglycemic agents, watch for signs and symptoms of hypoglycemia. They include:  Dizziness.  Shaking.  Sweating.  Chills.  Confusion.  Drink plenty of water while you exercise to prevent dehydration or heat stroke. Body water is lost during exercise and must be replaced.  Talk to your health care provider before starting an exercise program to make sure it is safe for you. Remember, almost any type of activity is better than none. Document Released:  07/17/2003 Document Revised: 12/27/2012 Document Reviewed: 10/03/2012 [facility] Patient Information 2014 [place].

## 2013-09-12 NOTE — Progress Notes (Signed)
Subjective:    Patient ID: Janet Choi, female    DOB: 27-Nov-1940, 73 y.o.   MRN: [id number]  HPI Patient is seen in 2 week F/U of routine quarterly check-up because of very high glucose, A1c's and Triglycerides. Invokana 300 mg (1/2 tab) was added to her regimen and she has lost 2 # , but she reports infrequently checked CBG's are still running in the mid to high 300's. Last A1c was 11.3 % and patient freely admits her longstanding poor dietary compliance and lack of desire to improve. Interesting she has had no diabetic poly's. She does report dysthesias of her feet. Also she admkits after stopping lipitor for presumed secondary myalgias that her Sx's never improved and desires to re-try Atorvastatin. Current Outpatient Prescriptions on File Prior to Visit  Medication Sig Dispense Refill  . amitriptyline (ELAVIL) 25 MG tablet Take 1 tablet (25 mg total) by mouth 3 (three) times daily.  270 tablet  1  . Cholecalciferol (VITAMIN D) 2000 UNITS tablet Take 2,000 Units by mouth daily.      . clonazePAM (KLONOPIN) 1 MG tablet Take 1 tablet (1 mg total) by mouth at bedtime as needed for anxiety.  90 tablet  1  . cyanocobalamin (,VITAMIN B-12,) 1000 MCG/ML injection Inject 1,000 mcg into the muscle every 30 (thirty) days.      Marland Kitchen FLUoxetine (PROZAC) 40 MG capsule Take 1 capsule (40 mg total) by mouth daily.  90 capsule  2  . furosemide (LASIX) 40 MG tablet Take 0.5 tablets (20 mg total) by mouth daily.  90 tablet  2  . glimepiride (AMARYL) 4 MG tablet Take 1 tablet (4 mg total) by mouth 2 (two) times daily.  180 tablet  2  . HYDROcodone-acetaminophen (NORCO/VICODIN) 5-325 MG per tablet Take 1 tablet by mouth every 6 (six) hours as needed.  100 tablet  0  . hydrOXYzine (ATARAX/VISTARIL) 25 MG tablet Take 1 tablet (25 mg total) by mouth 3 (three) times daily as needed for itching.  90 tablet  2  . IRON, FERROUS GLUCONATE, PO Take 45 mg by mouth.      . losartan (COZAAR) 50 MG tablet Take 1 tablet (50 mg  total) by mouth daily.  90 tablet  2  . meloxicam (MOBIC) 15 MG tablet Take 1 tablet (15 mg total) by mouth daily.  90 tablet  2  . metFORMIN (GLUCOPHAGE) 1000 MG tablet Take 1 tablet (1,000 mg total) by mouth 2 (two) times daily with a meal.  180 tablet  1  . rOPINIRole (REQUIP) 1 MG tablet Take 1 tablet (1 mg total) by mouth 4 (four) times daily.  360 tablet  2  . verapamil (COVERA HS) 180 MG (CO) 24 hr tablet Take 1 tablet (180 mg total) by mouth at bedtime.  90 tablet  2   No current facility-administered medications on file prior to visit.   Allergies  Allergen Reactions  . Aspirin   . Crestor [Rosuvastatin]   . Lipitor [Atorvastatin]   . Lyrica [Pregabalin] Other (See Comments)    Blurred vision  . Minocycline Hcl     REACTION: Reaction not known  . Oruvail [Ketoprofen]   . Tetracyclines & Related   . Tricor [Fenofibrate]   . Vasotec [Enalaprilat]   . Victoza [Liraglutide]   . Zinc    Past Medical History  Diagnosis Date  . Diabetes mellitus without complication   . Hypertension   . Hyperlipidemia   . OSA (obstructive sleep apnea)   .  DJD (degenerative joint disease)   . RLS (restless legs syndrome)   . Obesity   . Vitamin D deficiency    Review of Systems neg and non contributory to above.  Objective:   Physical Exam BP 124/76  Pulse 88  Temp(Src) 98.1 F (36.7 C) (Temporal)  Resp 16  Ht 5' 3.25" (1.607 m)  Wt 214 lb (97.07 kg)  BMI 37.59 kg/m2 Morbidly obese. HEENT - Eac's patent. TM's Nl.EOM's full. PERRLA. NasoOroPharynx clear. Neck - supple. Nl Thyroid. No bruits nodes JVD Chest - Clear equal BS Cor - Nl HS. RRR w/o sig MGR. PP 1(+) with 1(+) pretibial edema. edema. Abd - Very redundant abdominal panniculus. No palpable organomegaly, masses or tenderness. BS nl. MS- FROM. w/o deformities. Muscle power tone and bulk Nl. Gait Nl. Neuro - No obvious Cr N abnormalities.Motor and Cerebellar functions appear Nl w/o focal abnormalities. Decreased sensory in a  stocking distribution.  Assessment & Plan:   1. Hypertension  2. 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 Take 1/2 tab - Long discussion regarding poor prognosis w/o better dietary control and recommended the book  "The End of Diabetes". - Patient seems very unmotivated.  3. Morbid obesity (BMI 39)  4. Hyperlipidemia

## 2013-10-09 ENCOUNTER — Other Ambulatory Visit: Payer: Self-pay | Admitting: Physician Assistant

## 2013-11-01 ENCOUNTER — Encounter: Payer: Self-pay | Admitting: Internal Medicine

## 2013-11-19 ENCOUNTER — Telehealth: Payer: Self-pay | Admitting: *Deleted

## 2013-11-19 NOTE — Telephone Encounter (Signed)
Patient changed her Verapamil administration from AM to PM and states she is dizzy in the AM.  Per Dr Melford Aase, OK to change back to dosing in the morning.

## 2013-11-28 ENCOUNTER — Encounter: Payer: Self-pay | Admitting: Internal Medicine

## 2013-11-28 ENCOUNTER — Other Ambulatory Visit: Payer: Self-pay | Admitting: Internal Medicine

## 2013-11-28 ENCOUNTER — Ambulatory Visit (INDEPENDENT_AMBULATORY_CARE_PROVIDER_SITE_OTHER): Payer: Commercial Managed Care - HMO | Admitting: Internal Medicine

## 2013-11-28 VITALS — BP 122/66 | HR 88 | Temp 97.9°F | Resp 16 | Ht 63.0 in | Wt 215.4 lb

## 2013-11-28 DIAGNOSIS — E559 Vitamin D deficiency, unspecified: Secondary | ICD-10-CM

## 2013-11-28 DIAGNOSIS — I10 Essential (primary) hypertension: Secondary | ICD-10-CM

## 2013-11-28 DIAGNOSIS — Z789 Other specified health status: Secondary | ICD-10-CM

## 2013-11-28 DIAGNOSIS — Z1212 Encounter for screening for malignant neoplasm of rectum: Secondary | ICD-10-CM

## 2013-11-28 DIAGNOSIS — Z9119 Patient's noncompliance with other medical treatment and regimen: Secondary | ICD-10-CM

## 2013-11-28 DIAGNOSIS — E1129 Type 2 diabetes mellitus with other diabetic kidney complication: Secondary | ICD-10-CM

## 2013-11-28 DIAGNOSIS — Z79899 Other long term (current) drug therapy: Secondary | ICD-10-CM

## 2013-11-28 DIAGNOSIS — Z91199 Patient's noncompliance with other medical treatment and regimen due to unspecified reason: Secondary | ICD-10-CM

## 2013-11-28 DIAGNOSIS — E782 Mixed hyperlipidemia: Secondary | ICD-10-CM

## 2013-11-28 DIAGNOSIS — Z1331 Encounter for screening for depression: Secondary | ICD-10-CM

## 2013-11-28 DIAGNOSIS — Z0000 Encounter for general adult medical examination without abnormal findings: Secondary | ICD-10-CM

## 2013-11-28 MED ORDER — ASPIRIN EC 81 MG PO TBEC: 81.0000 mg | DELAYED_RELEASE_TABLET | Freq: Every day | ORAL | Status: AC

## 2013-11-28 NOTE — Patient Instructions (Addendum)
 Recommend the book "The END of DIETING" by Dr Joel Furman   and the book "The END of DIABETES " by Dr Joel Fuhrman  At [employer] - get book & Audio CD's      Being diabetic has a  300% increased risk for heart attack, stroke, cancer, and alzheimer- type vascular dementia. It is very important that you work harder with diet by avoiding all foods that are white except chicken & fish. Avoid white rice (brown & wild rice is OK), white potatoes (sweetpotatoes in moderation is OK), White bread or wheat bread or anything made out of white flour like bagels, donuts, rolls, buns, biscuits, cakes, pastries, cookies, pizza crust, and pasta (made from white flour & egg whites) - vegetarian pasta or spinach or wheat pasta is OK. Multigrain breads like Arnold's or Pepperidge [employer], or multigrain sandwich thins or flatbreads.  Diet, exercise and weight loss can reverse and cure diabetes in the early stages.  Diet, exercise and weight loss is very important in the control and prevention of complications of diabetes which affects every system in your body, ie. Brain - dementia/stroke, eyes - glaucoma/blindness, heart - heart attack/heart failure, kidneys - dialysis, stomach - gastric paralysis, intestines - malabsorption, nerves - severe painful neuritis, circulation - gangrene & loss of a leg(s), and finally cancer and Alzheimers.    I recommend avoid fried & greasy foods,  sweets/candy, white rice (brown or wild rice or Quinoa is OK), white potatoes (sweet potatoes are OK) - anything made from white flour - bagels, doughnuts, rolls, buns, biscuits,white and wheat breads, pizza crust and traditional pasta made of white flour & egg white(vegetarian pasta or spinach or wheat pasta is OK).  Multi-grain bread is OK - like multi-grain flat bread or sandwich thins. Avoid alcohol in excess. Exercise is also important.    Eat all the vegetables you want - avoid meat, especially red meat and dairy - especially cheese.  Cheese  is the most concentrated form of trans-fats which is the worst thing to clog up our arteries. Veggie cheese is OK which can be found in the fresh produce section at [employer] or [employer] or [employer]  Preventative Care for Adults - Female      MAINTAIN REGULAR HEALTH EXAMS:  A routine yearly physical is a good way to check in with your primary care provider about your health and preventive screening. It is also an opportunity to share updates about your health and any concerns you have, and receive a thorough all-over exam.   Most health insurance companies pay for at least some preventative services.  Check with your health plan for specific coverages.  WHAT PREVENTATIVE SERVICES DO WOMEN NEED?  Adult women should have their weight and blood pressure checked regularly.   Women age 35 and older should have their cholesterol levels checked regularly.  Women should be screened for cervical cancer with a Pap smear and pelvic exam beginning at either age 21, or 3 years after they become sexually activity.    Breast cancer screening generally begins at age 40 with a mammogram and breast exam by your primary care provider.    Beginning at age 50 and continuing to age 75, women should be screened for colorectal cancer.  Certain people may need continued testing until age 85.  Updating vaccinations is part of preventative care.  Vaccinations help protect against diseases such as the flu.  Osteoporosis is a disease in which the bones lose minerals and strength   as we age. Women ages 65 and over should discuss this with their caregivers, as should women after menopause who have other risk factors.  Lab tests are generally done as part of preventative care to screen for anemia and blood disorders, to screen for problems with the kidneys and liver, to screen for bladder problems, to check blood sugar, and to check your cholesterol level.  Preventative services generally include counseling about  diet, exercise, avoiding tobacco, drugs, excessive alcohol consumption, and sexually transmitted infections.    GENERAL RECOMMENDATIONS FOR GOOD HEALTH:  Healthy diet:  Eat a variety of foods, including fruit, vegetables, animal or vegetable protein, such as meat, fish, chicken, and eggs, or beans, lentils, tofu, and grains, such as rice.  Drink plenty of water daily.  Decrease saturated fat in the diet, avoid lots of red meat, processed foods, sweets, fast foods, and fried foods.  Exercise:  Aerobic exercise helps maintain good heart health. At least 30-40 minutes of moderate-intensity exercise is recommended. For example, a brisk walk that increases your heart rate and breathing. This should be done on most days of the week.   Find a type of exercise or a variety of exercises that you enjoy so that it becomes a part of your daily life.  Examples are running, walking, swimming, water aerobics, and biking.  For motivation and support, explore group exercise such as aerobic class, spin class, Zumba, Yoga,or  martial arts, etc.    Set exercise goals for yourself, such as a certain weight goal, walk or run in a race such as a 5k walk/run.  Speak to your primary care provider about exercise goals.  Disease prevention:  If you smoke or chew tobacco, find out from your caregiver how to quit. It can literally save your life, no matter how long you have been a tobacco user. If you do not use tobacco, never begin.   Maintain a healthy diet and normal weight. Increased weight leads to problems with blood pressure and diabetes.   The Body Mass Index or BMI is a way of measuring how much of your body is fat. Having a BMI above 27 increases the risk of heart disease, diabetes, hypertension, stroke and other problems related to obesity. Your caregiver can help determine your BMI and based on it develop an exercise and dietary program to help you achieve or maintain this important measurement at a  healthful level.  High blood pressure causes heart and blood vessel problems.  Persistent high blood pressure should be treated with medicine if weight loss and exercise do not work.   Fat and cholesterol leaves deposits in your arteries that can block them. This causes heart disease and vessel disease elsewhere in your body.  If your cholesterol is found to be high, or if you have heart disease or certain other medical conditions, then you may need to have your cholesterol monitored frequently and be treated with medication.   Ask if you should have a cardiac stress test if your history suggests this. A stress test is a test done on a treadmill that looks for heart disease. This test can find disease prior to there being a problem.  Menopause can be associated with physical symptoms and risks. Hormone replacement therapy is available to decrease these. You should talk to your caregiver about whether starting or continuing to take hormones is right for you.   Osteoporosis is a disease in which the bones lose minerals and strength as we age. This can   result in serious bone fractures. Risk of osteoporosis can be identified using a bone density scan. Women ages 65 and over should discuss this with their caregivers, as should women after menopause who have other risk factors. Ask your caregiver whether you should be taking a calcium supplement and Vitamin D, to reduce the rate of osteoporosis.   Avoid drinking alcohol in excess (more than two drinks per day).  Avoid use of street drugs. Do not share needles with anyone. Ask for professional help if you need assistance or instructions on stopping the use of alcohol, cigarettes, and/or drugs.  Brush your teeth twice a day with fluoride toothpaste, and floss once a day. Good oral hygiene prevents tooth decay and gum disease. The problems can be painful, unattractive, and can cause other health problems. Visit your dentist for a routine oral and dental check  up and preventive care every 6-12 months.   Look at your skin regularly.  Use a mirror to look at your back. Notify your caregivers of changes in moles, especially if there are changes in shapes, colors, a size larger than a pencil eraser, an irregular border, or development of new moles.  Safety:  Use seatbelts 100% of the time, whether driving or as a passenger.  Use safety devices such as hearing protection if you work in environments with loud noise or significant background noise.  Use safety glasses when doing any work that could send debris in to the eyes.  Use a helmet if you ride a bike or motorcycle.  Use appropriate safety gear for contact sports.  Talk to your caregiver about gun safety.  Use sunscreen with a SPF (or skin protection factor) of 15 or greater.  Lighter skinned people are at a greater risk of skin cancer. Don't forget to also wear sunglasses in order to protect your eyes from too much damaging sunlight. Damaging sunlight can accelerate cataract formation.   Practice safe sex. Use condoms. Condoms are used for birth control and to help reduce the spread of sexually transmitted infections (or STIs).  Some of the STIs are gonorrhea (the clap), chlamydia, syphilis, trichomonas, herpes, HPV (human papilloma virus) and HIV (human immunodeficiency virus) which causes AIDS. The herpes, HIV and HPV are viral illnesses that have no cure. These can result in disability, cancer and death.   Keep carbon monoxide and smoke detectors in your home functioning at all times. Change the batteries every 6 months or use a model that plugs into the wall.   Vaccinations:  Stay up to date with your tetanus shots and other required immunizations. You should have a booster for tetanus every 10 years. Be sure to get your flu shot every year, since 5%-20% of the [place] population comes down with the flu. The flu vaccine changes each year, so being vaccinated once is not enough. Get your shot in the fall,  before the flu season peaks.   Other vaccines to consider:  Human Papilloma Virus or HPV causes cancer of the cervix, and other infections that can be transmitted from person to person. There is a vaccine for HPV, and females should get immunized between the ages of 11 and 26. It requires a series of 3 shots.   Pneumococcal vaccine to protect against certain types of pneumonia.  This is normally recommended for adults age 65 or older.  However, adults younger than 73 years old with certain underlying conditions such as diabetes, heart or lung disease should also receive the vaccine.  Shingles   vaccine to protect against Varicella Zoster if you are older than age 60, or younger than 73 years old with certain underlying illness.  Hepatitis A vaccine to protect against a form of infection of the liver by a virus acquired from food.  Hepatitis B vaccine to protect against a form of infection of the liver by a virus acquired from blood or body fluids, particularly if you work in health care.  If you plan to travel internationally, check with your local health department for specific vaccination recommendations.  Cancer Screening:  Breast cancer screening is essential to preventive care for women. All women age 20 and older should perform a breast self-exam every month. At age 40 and older, women should have their caregiver complete a breast exam each year. Women at ages 40 and older should have a mammogram (x-ray film) of the breasts. Your caregiver can discuss how often you need mammograms.    Cervical cancer screening includes taking a Pap smear (sample of cells examined under a microscope) from the cervix (end of the uterus). It also includes testing for HPV (Human Papilloma Virus, which can cause cervical cancer). Screening and a pelvic exam should begin at age 21, or 3 years after a woman becomes sexually active. Screening should occur every year, with a Pap smear but no HPV testing, up to age 30.  After age 30, you should have a Pap smear every 3 years with HPV testing, if no HPV was found previously.   Most routine colon cancer screening begins at the age of 50. On a yearly basis, doctors may provide special easy to use take-home tests to check for hidden blood in the stool. Sigmoidoscopy or colonoscopy can detect the earliest forms of colon cancer and is life saving. These tests use a small camera at the end of a tube to directly examine the colon. Speak to your caregiver about this at age 50, when routine screening begins (and is repeated every 5 years unless early forms of pre-cancerous polyps or small growths are found).     

## 2013-11-28 NOTE — Progress Notes (Signed)
Patient ID: Janet Choi, female   DOB: Jul 08, 1940, 73 y.o.   MRN: [id number]   Annual Screening Comprehensive Examination  This very nice 73 y.o.DWF presents for complete physical.  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    HTN predates since 1997. Patient's BP has been controlled and today's BP: 122/66 mmHg. Patient denies any cardiac symptoms as chest pain, palpitations, shortness of breath, dizziness or ankle swelling.   Patient's hyperlipidemia is not controlled with diet and patient is intolerant/allergic to statins, fibrates, Zetia and she admits a very poor diet. 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   Patient denies reactive hypoglycemic symptoms, visual blurring, diabetic polys, or paresthesias.   Finally, patient has history of Vitamin D Deficiency and last Vitamin D was 57 in Jan 2015. Patient supplements Vit D w/o any side effects.    Medication Sig  . amitriptyline (ELAVIL) 25 MG tablet TAKE 1 TABLET THREE TIMES DAILY  . Canagliflozin (INVOKANA) 300 MG TABS Take 1 tablet (300 mg total) by mouth daily at 6 (six) AM. Take 1/2 tab  . VITAMIN D 2000 UNITS tablet Take 2,000 Units by mouth daily.  . clonazePAM (KLONOPIN) 1 MG tablet Take 1 tablet (1 mg total) by mouth at bedtime as needed for anxiety.  Marland Kitchen VITAMIN B-12) 1000 MCG/ML inj Inject 1,000 mcg into the muscle every 30 (thirty) days.  Marland Kitchen FLUoxetine (PROZAC) 40 MG capsule Take 1 capsule (40 mg total) by mouth daily.  . furosemide (LASIX) 40 MG tablet Take 0.5 tablets (20 mg total) by mouth daily.  Marland Kitchen glimepiride (AMARYL) 4 MG tablet Take 1 tablet (4  mg total) by mouth 2 (two) times daily.  . NORCO 5-325 MG  Take 1 tablet by mouth every 6 (six) hours as needed.  . hydrOXYzine  25 MG tablet Take 1 tablet (25 mg total) by mouth 3 (three) times daily   . IRON, FERROUS GLUCONATE Take 45 mg by mouth.  . losartan  50 MG tablet Take 1 tablet (50 mg total) by mouth daily.  . meloxicam 15 MG tablet Take 1 tablet (15 mg total) by mouth daily.  . metFORMIN  1000 MG tablet Take 1 tablet (1,000 mg total) by mouth 2 (two) times daily with a meal.  . rOPINIRole (REQUIP) 1 MG tablet Take 1 tablet (1 mg total) by mouth 4 (four) times daily.  . verapamil  180 MG (CO) 24 hr tablet Take 1 tablet (180 mg total) by mouth at bedtime.   Allergies  Allergen Reactions  . Aspirin   . Crestor [Rosuvastatin]   . Lipitor [Atorvastatin]   . Lyrica [Pregabalin] Other (See Comments)    Blurred vision  . Minocycline Hcl     REACTION: Reaction not known  . Oruvail [Ketoprofen]   . Tetracyclines & Related   . Tricor [Fenofibrate]   . Vasotec [Enalaprilat]   . Victoza [Liraglutide]   . Zinc    Past Medical History  Diagnosis Date  . Diabetes mellitus without complication   . Hypertension   . Hyperlipidemia   . OSA (obstructive  sleep apnea)   . DJD (degenerative joint disease)   . RLS (restless legs syndrome)   . Obesity   . Vitamin D deficiency    Past Surgical History  Procedure Laterality Date  . Joint replacement    . Cataract extraction     Family History  Problem Relation Age of Onset  . Heart disease Mother    History  Substance Use Topics  . Smoking status: Former Smoker    Quit date: 05/11/1983  . Smokeless tobacco: Not on file  . Alcohol Use: No    ROS Constitutional: Denies fever, chills, weight loss/gain, headaches, insomnia, fatigue, night sweats, and change in appetite. Eyes: Denies redness, blurred vision, diplopia, discharge, itchy, watery eyes.  ENT: Denies discharge, congestion, post nasal drip, epistaxis, sore throat,  earache, hearing loss, dental pain, Tinnitus, Vertigo, Sinus pain, snoring.  Cardio: Denies chest pain, palpitations, irregular heartbeat, syncope, dyspnea, diaphoresis, orthopnea, PND, claudication, edema Respiratory: denies cough, dyspnea, DOE, pleurisy, hoarseness, laryngitis, wheezing.  Gastrointestinal: Denies dysphagia, heartburn, reflux, water brash, pain, cramps, nausea, vomiting, bloating, diarrhea, constipation, hematemesis, melena, hematochezia, jaundice, hemorrhoids Genitourinary: Denies dysuria, frequency, urgency, nocturia, hesitancy, discharge, hematuria, flank pain Breast: Breast lumps, nipple discharge, bleeding.  Musculoskeletal: Denies arthralgia, myalgia, stiffness, Jt. Swelling, pain, limp, and strain/sprain. Denies falls. Skin: Denies puritis, rash, hives, warts, acne, eczema, changing in skin lesion Neuro: No weakness, tremor, incoordination, spasms, paresthesia, pain Psychiatric: Denies confusion, memory loss, sensory loss. Denies Depression. Endocrine: Denies change in weight, skin, hair change, nocturia, and paresthesia, diabetic polys, visual blurring, hyper / hypo glycemic episodes.  Heme/Lymph: No excessive bleeding, bruising, enlarged lymph nodes.  Physical Exam  BP 122/66  Pulse 88  Temp(Src) 97.9 F (36.6 C) (Temporal)  Resp 16  Ht 5\' 3"  (1.6 m)  Wt 215 lb 6.4 oz (97.705 kg)  BMI 38.17 kg/m2  General Appearance: Well nourished and in no apparent distress. Eyes: PERRLA, EOMs, conjunctiva no swelling or erythema, normal fundi and vessels. Sinuses: No frontal/maxillary tenderness ENT/Mouth: EACs patent / TMs  nl. Nares clear without erythema, swelling, mucoid exudates. Oral hygiene is good. No erythema, swelling, or exudate. Tongue normal, non-obstructing. Tonsils not swollen or erythematous. Hearing normal.  Neck: Supple, thyroid normal. No bruits, nodes or JVD. Respiratory: Respiratory effort normal.  BS equal and clear bilateral without rales, rhonci,  wheezing or stridor. Cardio: Heart sounds are normal with regular rate and rhythm and no murmurs, rubs or gallops. Peripheral pulses are normal and equal bilaterally without edema. No aortic or femoral bruits. Chest: symmetric with normal excursions and percussion. Breasts: Symmetric, without lumps, nipple discharge, retractions, or fibrocystic changes.  Abdomen: Flat, soft, with bowl sounds. Nontender, no guarding, rebound, hernias, masses, or organomegaly.  Lymphatics: Non tender without lymphadenopathy.  Genitourinary:  Musculoskeletal: Full ROM all peripheral extremities, joint stability, 5/5 strength, and normal gait. Skin: Warm and dry without rashes, lesions, cyanosis, clubbing or  ecchymosis.  Neuro: Cranial nerves intact, reflexes equal bilaterally. Normal muscle tone, no cerebellar symptoms. Sensation intact.  Pysch: Awake and oriented X 3, normal affect, Insight and Judgment appropriate.   Assessment and Plan  1. Annual Screening Examination 2. Hypertension  3. Hyperlipidemia 4. 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 Vitamin D Deficiency 6. Morbid Obesity  7. Poor Compliance  Continue prudent diet as discussed, weight control, BP monitoring, regular exercise, and medications. Discussed med's effects and SE's. Screening labs and tests as requested with regular follow-up as recommended.

## 2013-11-29 LAB — BASIC METABOLIC PANEL WITH GFR
BUN: 32 mg/dL — ABNORMAL HIGH (ref 6–23)
CHLORIDE: 99 meq/L (ref 96–112)
CO2: 20 mEq/L (ref 19–32)
Calcium: 10 mg/dL (ref 8.4–10.5)
Creat: 1.62 mg/dL — ABNORMAL HIGH (ref 0.50–1.10)
GFR, EST AFRICAN AMERICAN: 36 mL/min — AB
GFR, EST NON AFRICAN AMERICAN: 31 mL/min — AB
Glucose, Bld: 264 mg/dL — ABNORMAL HIGH (ref 70–99)
Potassium: 4.8 mEq/L (ref 3.5–5.3)
SODIUM: 136 meq/L (ref 135–145)

## 2013-11-29 LAB — HEPATIC FUNCTION PANEL
ALK PHOS: 82 U/L (ref 39–117)
ALT: 11 U/L (ref 0–35)
AST: 12 U/L (ref 0–37)
Albumin: 4.4 g/dL (ref 3.5–5.2)
BILIRUBIN DIRECT: 0.1 mg/dL (ref 0.0–0.3)
BILIRUBIN INDIRECT: 0.3 mg/dL (ref 0.2–1.2)
BILIRUBIN TOTAL: 0.4 mg/dL (ref 0.2–1.2)
Total Protein: 7.2 g/dL (ref 6.0–8.3)

## 2013-11-29 LAB — HEMOGLOBIN A1C
Hgb A1c MFr Bld: 11.4 % — ABNORMAL HIGH (ref <5.7)
Mean Plasma Glucose: 280 mg/dL — ABNORMAL HIGH (ref <117)

## 2013-11-29 LAB — CBC WITH DIFFERENTIAL/PLATELET
BASOS ABS: 0 10*3/uL (ref 0.0–0.1)
BASOS PCT: 0 % (ref 0–1)
Eosinophils Absolute: 0.1 10*3/uL (ref 0.0–0.7)
Eosinophils Relative: 3 % (ref 0–5)
HEMATOCRIT: 35.9 % — AB (ref 36.0–46.0)
HEMOGLOBIN: 12 g/dL (ref 12.0–15.0)
LYMPHS PCT: 32 % (ref 12–46)
Lymphs Abs: 1.6 10*3/uL (ref 0.7–4.0)
MCH: 28 pg (ref 26.0–34.0)
MCHC: 33.4 g/dL (ref 30.0–36.0)
MCV: 83.7 fL (ref 78.0–100.0)
MONO ABS: 0.3 10*3/uL (ref 0.1–1.0)
MONOS PCT: 7 % (ref 3–12)
NEUTROS ABS: 2.8 10*3/uL (ref 1.7–7.7)
NEUTROS PCT: 58 % (ref 43–77)
Platelets: 293 10*3/uL (ref 150–400)
RBC: 4.29 MIL/uL (ref 3.87–5.11)
RDW: 14.3 % (ref 11.5–15.5)
WBC: 4.9 10*3/uL (ref 4.0–10.5)

## 2013-11-29 LAB — LIPID PANEL
CHOL/HDL RATIO: 11.4 ratio
CHOLESTEROL: 435 mg/dL — AB (ref 0–200)
HDL: 38 mg/dL — AB (ref >39)
TRIGLYCERIDES: 842 mg/dL — AB (ref <150)

## 2013-11-29 LAB — URINALYSIS, MICROSCOPIC ONLY
Bacteria, UA: NONE SEEN
Casts: NONE SEEN
Crystals: NONE SEEN
Squamous Epithelial / LPF: NONE SEEN
WBC, UA: >50 WBC/hpf — AB (ref <3)

## 2013-11-29 LAB — MICROALBUMIN / CREATININE URINE RATIO
Creatinine, Urine: 35.3 mg/dL
MICROALB/CREAT RATIO: 36.3 mg/g — AB (ref 0.0–30.0)
Microalb, Ur: 1.28 mg/dL (ref 0.00–1.89)

## 2013-11-29 LAB — VITAMIN D 25 HYDROXY (VIT D DEFICIENCY, FRACTURES): Vit D, 25-Hydroxy: 47 ng/mL (ref 30–89)

## 2013-11-29 LAB — TSH: TSH: 1.335 u[IU]/mL (ref 0.350–4.500)

## 2013-11-29 LAB — INSULIN, FASTING: Insulin fasting, serum: 28 u[IU]/mL (ref 3–28)

## 2013-11-29 LAB — MAGNESIUM: Magnesium: 2.2 mg/dL (ref 1.5–2.5)

## 2013-12-01 ENCOUNTER — Other Ambulatory Visit: Payer: Self-pay | Admitting: Internal Medicine

## 2013-12-01 LAB — URINE CULTURE

## 2013-12-01 MED ORDER — FLUCONAZOLE 150 MG PO TABS: 150.0000 mg | ORAL_TABLET | Freq: Every day | ORAL | Status: AC

## 2013-12-18 ENCOUNTER — Encounter: Payer: Self-pay | Admitting: Internal Medicine

## 2013-12-18 ENCOUNTER — Ambulatory Visit (INDEPENDENT_AMBULATORY_CARE_PROVIDER_SITE_OTHER): Payer: Commercial Managed Care - HMO | Admitting: Internal Medicine

## 2013-12-18 ENCOUNTER — Other Ambulatory Visit: Payer: Self-pay | Admitting: *Deleted

## 2013-12-18 VITALS — BP 136/84 | HR 88 | Temp 97.9°F | Resp 16 | Ht 63.0 in | Wt 210.8 lb

## 2013-12-18 DIAGNOSIS — H8120 Vestibular neuronitis, unspecified ear: Secondary | ICD-10-CM

## 2013-12-18 MED ORDER — PREDNISONE 20 MG PO TABS: ORAL_TABLET | ORAL | Status: DC

## 2013-12-18 MED ORDER — MELOXICAM 15 MG PO TABS: 15.0000 mg | ORAL_TABLET | Freq: Every day | ORAL | Status: DC

## 2013-12-18 MED ORDER — GEMFIBROZIL 600 MG PO TABS: 600.0000 mg | ORAL_TABLET | Freq: Two times a day (BID) | ORAL | Status: DC

## 2013-12-18 MED ORDER — DIAZEPAM 5 MG PO TABS: ORAL_TABLET | ORAL | Status: DC

## 2013-12-18 NOTE — Patient Instructions (Signed)

## 2013-12-18 NOTE — Progress Notes (Signed)
Subjective:    Patient ID: Janet Choi, female    DOB: [birth year], 73 y.o.   MRN: [id number]  HPI Very nice 73 yo WWf w/ T2_NIDDM, HTN presenting with Positional Vertigo & associated N/V.    Medication List   amitriptyline 25 MG tablet  Commonly known as:  ELAVIL  TAKE 1 TABLET THREE TIMES DAILY     aspirin EC 81 MG tablet  Take 1 tablet (81 mg total) by mouth daily.     Canagliflozin 300 MG Tabs  Commonly known as:  INVOKANA  Take 1 tablet (300 mg total) by mouth daily at 6 (six) AM. Take 1/2 tab     clonazePAM 1 MG tablet  Commonly known as:  KLONOPIN  Take 1 tablet (1 mg total) by mouth at bedtime as needed for anxiety.     cyanocobalamin 1000 MCG/ML injection  Commonly known as:  (VITAMIN B-12)  Inject 1,000 mcg into the muscle every 30 (thirty) days.     diazepam 5 MG tablet  Commonly known as:  VALIUM  Take 1/2 to 1 tablet 3 x day as needed for Vertigo     fluconazole 150 MG tablet  Commonly known as:  DIFLUCAN  Take 1 tablet (150 mg total) by mouth daily.     FLUoxetine 40 MG capsule  Commonly known as:  PROZAC  Take 1 capsule (40 mg total) by mouth daily.     furosemide 40 MG tablet  Commonly known as:  LASIX  Take 0.5 tablets (20 mg total) by mouth daily.     gemfibrozil 600 MG tablet  Commonly known as:  LOPID  Take 1 tablet (600 mg total) by mouth 2 (two) times daily before a meal.     glimepiride 4 MG tablet  Commonly known as:  AMARYL  Take 1 tablet (4 mg total) by mouth 2 (two) times daily.     HYDROcodone-acetaminophen 5-325 MG per tablet  Commonly known as:  NORCO/VICODIN  Take 1 tablet by mouth every 6 (six) hours as needed.     hydrOXYzine 25 MG tablet  Commonly known as:  ATARAX/VISTARIL  Take 1 tablet (25 mg total) by mouth 3 (three) times daily as needed for itching.     IRON (FERROUS GLUCONATE) PO  Take 45 mg by mouth.     losartan 50 MG tablet  Commonly known as:  COZAAR  Take 1 tablet (50 mg total) by mouth daily.     meloxicam 15 MG tablet  Commonly known as:  MOBIC  Take 1 tablet (15 mg total) by mouth daily.     metFORMIN 1000 MG tablet  Commonly known as:  GLUCOPHAGE  Take 1 tablet (1,000 mg total) by mouth 2 (two) times daily with a meal.     OVER THE COUNTER MEDICATION  CVS acid reduced 75 mg at bedtime     predniSONE 20 MG tablet  Commonly known as:  DELTASONE  1 tab 3 x day for 2 days, then 1 tab 2 x day for 2 days, then 1 tab 1 x day for 3 days     Red Yeast Rice 600 MG Caps  Take by mouth 2 (two) times daily.     rOPINIRole 1 MG tablet  Commonly known as:  REQUIP  Take 1 tablet (1 mg total) by mouth 4 (four) times daily.     verapamil 180 MG (CO) 24 hr tablet  Commonly known as:  COVERA HS  Take 1 tablet (180 mg  total) by mouth at bedtime.     Vitamin D 2000 UNITS tablet  Take 2,000 Units by mouth daily.       Allergies  Allergen Reactions  . Aspirin   . Crestor [Rosuvastatin]   . Lipitor [Atorvastatin]   . Lyrica [Pregabalin] Other (See Comments)    Blurred vision  . Minocycline Hcl     REACTION: Reaction not known  . Oruvail [Ketoprofen]   . Tetracyclines & Related   . Tricor [Fenofibrate]   . Vasotec [Enalaprilat]   . Victoza [Liraglutide]   . Zinc    Past Medical History  Diagnosis Date  . Diabetes mellitus without complication   . Hypertension   . Hyperlipidemia   . OSA (obstructive sleep apnea)   . DJD (degenerative joint disease)   . RLS (restless legs syndrome)   . Obesity   . Vitamin D deficiency    Review of Systems In addition to the HPI above,  No Fever-chills,  No Headache, No changes with Vision or hearing,  No problems swallowing food or Liquids,  No Chest pain or productive Cough or Shortness of Breath,  No Abdominal pain, (+) Nausea & Vomitting,but  Bowel movements are regular,  No Blood in stool or Urine,  No dysuria,  No new skin rashes or bruises,  No new joints pains-aches,  No new weakness, tingling, numbness in any extremity,  No  recent weight loss,  No polyuria, polydypsia or polyphagia,  A full 10 point Review of Systems was done, except as stated above, all other Review of Systems were negative  Objective:   Physical Exam   BP 136/84  Pulse 88  Temp(Src) 97.9 F (36.6 C) (Temporal)  Resp 16  Ht 5\' 3"  (1.6 m)  Wt 210 lb 12.8 oz (95.618 kg)  BMI 37.35 kg/m2  HEENT - Eac's patent. TM's Nl. EOM's full. PERRLA. NasoOroPharynx clear. Neck - supple. Nl Thyroid. Carotids 2+ & No bruits, nodes, JVD Chest - Clear equal BS w/o Rales, rhonchi, wheezes. Cor - Nl HS. RRR w/o sig MGR. PP 1(+). No edema. Abd - No palpable organomegaly, masses or tenderness. BS nl. MS- FROM w/o deformities. Muscle power, tone and bulk Nl. Gait Nl. Neuro - No obvious Cr N abnormalities. Sensory, motor and Cerebellar functions appear Nl w/o focal abnormalities. Psyche - Mental status normal & appropriate.  No delusions, ideations or obvious mood abnormalities.  Assessment & Plan:   1. Vestibular neuronitis, unspecified laterality  -Rx Diazepam 5 mg #50 - tid prn; Prednisone 20 mg #13 - taper

## 2013-12-21 ENCOUNTER — Encounter: Payer: Self-pay | Admitting: Internal Medicine

## 2013-12-21 ENCOUNTER — Other Ambulatory Visit: Payer: Self-pay | Admitting: Internal Medicine

## 2013-12-21 ENCOUNTER — Ambulatory Visit (INDEPENDENT_AMBULATORY_CARE_PROVIDER_SITE_OTHER): Payer: Commercial Managed Care - HMO | Admitting: Internal Medicine

## 2013-12-21 VITALS — BP 158/84 | HR 76 | Temp 97.5°F | Resp 16 | Ht 63.5 in | Wt 210.8 lb

## 2013-12-21 DIAGNOSIS — I10 Essential (primary) hypertension: Secondary | ICD-10-CM

## 2013-12-21 DIAGNOSIS — D485 Neoplasm of uncertain behavior of skin: Secondary | ICD-10-CM

## 2013-12-21 MED ORDER — LIDOCAINE 5 % EX PTCH: 1.0000 | MEDICATED_PATCH | Freq: Two times a day (BID) | CUTANEOUS | Status: AC

## 2013-12-21 NOTE — Progress Notes (Signed)
   Subjective:    Patient ID: Janet Choi, female    DOB: [birth year], 74 y.o.   MRN: [id number]  HPI Patient presents for evaluation of elevated BP of 158/84 - dropping to 134/82 at rest. CV ROS is negative. Also she is concerned about several skin lesions that may have changed recently.  [place] - reviewed  Review of Systems non- contributatory  Objective:   Physical Exam BP 158/84  Pulse 76  Temp(Src) 97.5 F (36.4 C) (Temporal)  Resp 16  Ht 5' 3.5" (1.613 m)  Wt 210 lb 12.8 oz (95.618 kg)  BMI 36.75 kg/m2  Endomorphic habitus HEENT - neg.  Neck - supple. No Br N JVD Chest - clear = BS Cor -  RRR w/o sig mgr. PP nl w/o edema. Abd - soft - benign w/o masses/tenderness & BS nl. MS - FROM w/o deformities Neuro - WNL w/o focal abn Skin - There is a (1) 3-4 mm exophytic filamentous lesion of the left ear lobe, (2) a 4 mm x 8 xx crusty brown raised lesion of the left arm and a (3)  pinkish raised irregular lesion of the left mid shin.  Procedure(s) : lesion (1) anesthetized with Lidocaine 1% and sharply excised and the wound base deeply hyfrecated (17000). Lesion (2) treated with liquid Nitrogen by triple freeze/thaw technique (17003). Lastly, lesion (3) anesthetized w/lidocaine 1% and then excised by shave excisional Bx technique w/a #10 blade (42353) and sent for path to r/o a SCC/BCE and the wound base was deeply hyfrecated . 2 x 3 " Tegaderm applied.  Assessment & Plan:   1. Hypertension, Labile - patient to monitor BP's and call if remains elevated.   2. Neoplasm of uncertain behavior of skin  - Dermatology pathology

## 2014-01-03 ENCOUNTER — Encounter: Payer: Self-pay | Admitting: Internal Medicine

## 2014-01-09 ENCOUNTER — Encounter: Payer: Self-pay | Admitting: Internal Medicine

## 2014-01-09 ENCOUNTER — Ambulatory Visit (INDEPENDENT_AMBULATORY_CARE_PROVIDER_SITE_OTHER): Payer: Commercial Managed Care - HMO | Admitting: Internal Medicine

## 2014-01-09 VITALS — BP 106/62 | HR 80 | Temp 97.9°F | Resp 16

## 2014-01-09 DIAGNOSIS — C44701 Unspecified malignant neoplasm of skin of unspecified lower limb, including hip: Secondary | ICD-10-CM

## 2014-01-09 DIAGNOSIS — I10 Essential (primary) hypertension: Secondary | ICD-10-CM

## 2014-01-09 DIAGNOSIS — C44709 Unspecified malignant neoplasm of skin of left lower limb, including hip: Secondary | ICD-10-CM

## 2014-01-09 NOTE — Progress Notes (Signed)
   Subjective:    Patient ID: Janet Choi, female    DOB: 15-Aug-1940, 73 y.o.   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   CV Systems Review is negative.  Meds, All, PMH - reviewed & unchanged.  Review of Systems Objective:   Physical Exam BP 106/62  Pulse 80  Temp(Src) 97.9 F (36.6 C) (Temporal)  Resp 16 HEENT - Eac's patent. TM's Nl. EOM's full. PERRLA. NasoOroPharynx clear. Neck - supple. Nl Thyroid. Carotids 2+  Chest - Clear  Cor - Nl HS. RRR w/o sig MGR. PP 1(+). 1-2 (+) ankle/pedal edema. Abd - Neg. MS- FROM w/o deformities. Muscle power, tone and bulk Nl. Gait Nl. Neuro - No obvious Cr N abnormalities. Sensory, motor and Cerebellar functions appear Nl w/o focal abnormalities. Skin - healing crusted eschar ~ 10 mm in mid Left shin area.   After informed consent and local anesthesia with 2.0 cc Marcaine 0.5% w/epi the above area was prepped w/alcohol and the the previous Bx site was excised in an elliptical fashion with ~ 2 mm lateral margins and to the deep subcu. with a #10 blade. The wound was then closed with 4 vertical mattress sutures with 3-0 nylon and #3 interrupted sutures with nylon 3-0. Moderate tension is noted due to the anatomy and edema with little to no excess skin in the pretibial area.  The wound was painted with "newskin" sealant and the reinforced with a 2 x 3 " Tegoderm and then again with a 4 x 6 " Tegoderm and finally secured with a 2" Ace Wrap.  Assessment & Plan:   1. Unspecified essential hypertension  2. Cancer of skin of leg, left  - Lesion to Dermatology pathology  - wound care advised - ROV 1 week to recheck.

## 2014-01-15 ENCOUNTER — Ambulatory Visit: Payer: PPO | Admitting: Internal Medicine

## 2014-01-15 ENCOUNTER — Encounter: Payer: Self-pay | Admitting: Internal Medicine

## 2014-01-15 VITALS — BP 118/60 | HR 80 | Temp 98.6°F | Resp 18 | Ht 63.25 in

## 2014-01-15 DIAGNOSIS — T798XXA Other early complications of trauma, initial encounter: Secondary | ICD-10-CM

## 2014-01-15 MED ORDER — LEVOFLOXACIN 500 MG PO TABS: ORAL_TABLET | ORAL | Status: AC

## 2014-01-15 NOTE — Progress Notes (Signed)
   Subjective:    Patient ID: Janet Choi, female    DOB: April 23, 1941, 73 y.o.   MRN: [id number]  HPI Pastient presents for recheck of left shin re-excision of a skin cancer and reports redness and tenderness around the wound site with sl clear drainage   Meds/All/PMHx - unchanged  Review of Systems Pertinent as above  Objective:   Physical Exam BP 118/60  Pulse 80  Temp(Src) 98.6 F (37 C) (Temporal)  Resp 18  Ht 5' 3.25" (1.607 m)  Exam of the wound site reveals erythema and tenderness with a small amount of clear drainage. No lymphangitic streaking.  Assessment & Plan:   1. Infected wound, initial encounter  - Rx - Levaquin 500 mg # 13 ->  2 stat, then 1 bid x 2 days, then 1 qd - discussed wound care - ROV 1 week or prn.

## 2014-01-16 ENCOUNTER — Ambulatory Visit: Payer: Self-pay | Admitting: Internal Medicine

## 2014-01-22 ENCOUNTER — Ambulatory Visit (INDEPENDENT_AMBULATORY_CARE_PROVIDER_SITE_OTHER): Payer: Commercial Managed Care - HMO | Admitting: Internal Medicine

## 2014-01-22 ENCOUNTER — Encounter: Payer: Self-pay | Admitting: Internal Medicine

## 2014-01-22 VITALS — BP 102/64 | HR 88 | Temp 98.1°F | Resp 16 | Ht 63.25 in | Wt 225.4 lb

## 2014-01-22 DIAGNOSIS — T798XXA Other early complications of trauma, initial encounter: Secondary | ICD-10-CM

## 2014-01-22 DIAGNOSIS — T148XXA Other injury of unspecified body region, initial encounter: Secondary | ICD-10-CM

## 2014-01-22 DIAGNOSIS — L089 Local infection of the skin and subcutaneous tissue, unspecified: Secondary | ICD-10-CM

## 2014-01-22 MED ORDER — DOXYCYCLINE HYCLATE 100 MG PO CAPS: ORAL_CAPSULE | ORAL | Status: DC

## 2014-01-22 NOTE — Progress Notes (Signed)
   Subjective:    Patient ID: Janet Choi, female    DOB: 1940-08-12, 73 y.o.   MRN: [id number]  HPI  Patient is 1 week post excision of a squamous cell skin cancer of the left ant shin and present with redness around theWound site wit a slight and clear drainage Meds / All / PMH - nchanged  Review of Systems No fever chills sweats or otherssystemic sx's and otherwise non contributory.  Objective:   Physical Exam  BP 102/64  Pulse 88  Temp(Src) 98.1 F (36.7 C) (Temporal)  Resp 16  Ht 5' 3.25" (1.607 m)  Wt 225 lb 6.4 oz (102.241 kg)  BMI 39.59 kg/m2  Wound of left shin has surrounding redness with a clear drainage and no signs of lymphangitis. All sutures removed and healing ridge appears fixed and intact. The wound was then cleansed with H@W )@, then painted with Betadine solution and sterile non stick pad was secured with paper tape and the 2 " Ace wrap.   Assessment & Plan:   1. Infected wound, initial encounter  - Patient has questionable reaction to tetracycline over 25-30 years ago. Given Rx Doxycycline with precautions to monitor for SE's as discussed. -ROV 1 week

## 2014-01-24 ENCOUNTER — Ambulatory Visit: Payer: Self-pay | Admitting: Internal Medicine

## 2014-01-31 ENCOUNTER — Encounter: Payer: Self-pay | Admitting: Internal Medicine

## 2014-01-31 ENCOUNTER — Ambulatory Visit (INDEPENDENT_AMBULATORY_CARE_PROVIDER_SITE_OTHER): Payer: Commercial Managed Care - HMO | Admitting: Internal Medicine

## 2014-01-31 VITALS — BP 96/60 | HR 88 | Temp 97.7°F | Resp 16 | Ht 63.75 in | Wt 221.0 lb

## 2014-01-31 DIAGNOSIS — C4490 Unspecified malignant neoplasm of skin, unspecified: Secondary | ICD-10-CM

## 2014-01-31 NOTE — Progress Notes (Signed)
   Subjective:    Patient ID: Janet Choi, female    DOB: 1940-08-05, 73 y.o.   MRN: [id number]  HPI Diabetic patient returning for f/u of post surgical wound infection of her Left shin. Says "healed".  Review of Systems  Objective:   Physical Exam BP 96/60  Pulse 88  Temp(Src) 97.7 F (36.5 C) (Temporal)  Resp 16  Ht 5' 3.75" (1.619 m)  Wt 221 lb (100.245 kg)  BMI 38.24 kg/m2  Wound is indeed healed w/o signs of infection.  Assessment & Plan:   Healed post excisional Skin Ca site w/o current infection

## 2014-02-19 ENCOUNTER — Encounter: Payer: Self-pay | Admitting: Internal Medicine

## 2014-02-19 ENCOUNTER — Telehealth: Payer: Self-pay | Admitting: *Deleted

## 2014-02-19 DIAGNOSIS — L299 Pruritus, unspecified: Secondary | ICD-10-CM

## 2014-02-19 NOTE — Telephone Encounter (Signed)
REFERRAL TO DR GRAVES AT [facility]  NEEDS A APPT & WONT SCHEDULE TILL THEY GET REFERRAL. LEFT SHOULDER PAIN  * PLEASE LET PT KNOW

## 2014-02-20 MED ORDER — FLUOXETINE HCL 40 MG PO CAPS: 40.0000 mg | ORAL_CAPSULE | Freq: Every day | ORAL | Status: DC

## 2014-02-20 MED ORDER — GABAPENTIN 400 MG PO CAPS: 400.0000 mg | ORAL_CAPSULE | Freq: Two times a day (BID) | ORAL | Status: DC

## 2014-02-20 MED ORDER — DIAZEPAM 5 MG PO TABS: ORAL_TABLET | ORAL | Status: DC

## 2014-02-20 MED ORDER — HYDROXYZINE HCL 25 MG PO TABS: 25.0000 mg | ORAL_TABLET | Freq: Three times a day (TID) | ORAL | Status: DC | PRN

## 2014-02-21 ENCOUNTER — Other Ambulatory Visit: Payer: Self-pay | Admitting: Internal Medicine

## 2014-02-21 MED ORDER — NORTRIPTYLINE HCL 25 MG PO CAPS: ORAL_CAPSULE | ORAL | Status: DC

## 2014-02-21 NOTE — Telephone Encounter (Signed)
Left message on voicemail. Referral to see Dr.Graves is done.

## 2014-03-05 ENCOUNTER — Ambulatory Visit: Payer: Self-pay | Admitting: Physician Assistant

## 2014-03-08 ENCOUNTER — Ambulatory Visit: Payer: Self-pay | Admitting: Physician Assistant

## 2014-03-14 ENCOUNTER — Other Ambulatory Visit: Payer: Self-pay | Admitting: Internal Medicine

## 2014-03-14 ENCOUNTER — Telehealth: Payer: Self-pay | Admitting: *Deleted

## 2014-03-14 MED ORDER — HYDROCODONE-ACETAMINOPHEN 5-325 MG PO TABS: ORAL_TABLET | ORAL | Status: AC

## 2014-03-14 NOTE — Telephone Encounter (Signed)
Rx ready for pick up. Patient aware.  

## 2014-03-20 ENCOUNTER — Ambulatory Visit (INDEPENDENT_AMBULATORY_CARE_PROVIDER_SITE_OTHER): Payer: Commercial Managed Care - HMO | Admitting: Physician Assistant

## 2014-03-20 VITALS — BP 122/78 | HR 88 | Temp 98.2°F | Resp 16 | Wt 221.0 lb

## 2014-03-20 DIAGNOSIS — I10 Essential (primary) hypertension: Secondary | ICD-10-CM

## 2014-03-20 DIAGNOSIS — Z0001 Encounter for general adult medical examination with abnormal findings: Secondary | ICD-10-CM

## 2014-03-20 DIAGNOSIS — Z860100 Personal history of colon polyps, unspecified: Secondary | ICD-10-CM

## 2014-03-20 DIAGNOSIS — E1169 Type 2 diabetes mellitus with other specified complication: Secondary | ICD-10-CM | POA: Insufficient documentation

## 2014-03-20 DIAGNOSIS — Z9181 History of falling: Secondary | ICD-10-CM

## 2014-03-20 DIAGNOSIS — IMO0002 Reserved for concepts with insufficient information to code with codable children: Secondary | ICD-10-CM

## 2014-03-20 DIAGNOSIS — E1365 Other specified diabetes mellitus with hyperglycemia: Secondary | ICD-10-CM

## 2014-03-20 DIAGNOSIS — E782 Mixed hyperlipidemia: Secondary | ICD-10-CM

## 2014-03-20 DIAGNOSIS — E1329 Other specified diabetes mellitus with other diabetic kidney complication: Secondary | ICD-10-CM

## 2014-03-20 DIAGNOSIS — E785 Hyperlipidemia, unspecified: Secondary | ICD-10-CM

## 2014-03-20 DIAGNOSIS — Z9119 Patient's noncompliance with other medical treatment and regimen: Secondary | ICD-10-CM

## 2014-03-20 DIAGNOSIS — E1143 Type 2 diabetes mellitus with diabetic autonomic (poly)neuropathy: Secondary | ICD-10-CM

## 2014-03-20 DIAGNOSIS — E1322 Other specified diabetes mellitus with diabetic chronic kidney disease: Secondary | ICD-10-CM

## 2014-03-20 DIAGNOSIS — R6889 Other general symptoms and signs: Secondary | ICD-10-CM

## 2014-03-20 DIAGNOSIS — Z1331 Encounter for screening for depression: Secondary | ICD-10-CM

## 2014-03-20 DIAGNOSIS — N183 Chronic kidney disease, stage 3 (moderate): Secondary | ICD-10-CM

## 2014-03-20 DIAGNOSIS — K5730 Diverticulosis of large intestine without perforation or abscess without bleeding: Secondary | ICD-10-CM

## 2014-03-20 DIAGNOSIS — Z79899 Other long term (current) drug therapy: Secondary | ICD-10-CM

## 2014-03-20 DIAGNOSIS — E559 Vitamin D deficiency, unspecified: Secondary | ICD-10-CM

## 2014-03-20 DIAGNOSIS — Z23 Encounter for immunization: Secondary | ICD-10-CM

## 2014-03-20 DIAGNOSIS — Z91199 Patient's noncompliance with other medical treatment and regimen due to unspecified reason: Secondary | ICD-10-CM

## 2014-03-20 DIAGNOSIS — G2581 Restless legs syndrome: Secondary | ICD-10-CM | POA: Insufficient documentation

## 2014-03-20 MED ORDER — GABAPENTIN 400 MG PO CAPS: 400.0000 mg | ORAL_CAPSULE | Freq: Two times a day (BID) | ORAL | Status: DC

## 2014-03-20 MED ORDER — FLUOXETINE HCL 40 MG PO CAPS: 40.0000 mg | ORAL_CAPSULE | Freq: Every day | ORAL | Status: DC

## 2014-03-20 NOTE — Progress Notes (Signed)
MEDICARE ANNUAL WELLNESS VISIT AND FOLLOW UP  Assessment:   1. Need for prophylactic vaccination and inoculation against influenza - Flu vaccine HIGH DOSE PF  2. Essential hypertension - CBC with Differential - BASIC METABOLIC PANEL WITH GFR - Hepatic function panel - TSH  3. Diverticulosis of large intestine without hemorrhage Increase fiber  4. 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 Due to kidney function may have to cut back on MF and stop invokana, discussed possible need for insulin  5. Hyperlipidemia - Lipid panel  6. History of colonic polyps Colonoscopy up to date  7. Morbid obesity (BMI 39) Obesity with co morbidities- long discussion about weight loss, diet, and exercise  8. Vitamin D deficiency - Vit D  25 hydroxy (rtn osteoporosis monitoring)  9. Medication management - Magnesium  10. Compliance poor LONG discussion about compliance, she does not want to be on so many medications so I clearly explained the best way to decrease meds is weight loss and better eating. She wants to try this, will follow up 1 month with food diary.   11. RLS (restless legs syndrome) Continue requip, increase walking  12. Diabetic autonomic neuropathy associated with type 2 diabetes mellitus Continue Gabapentin, control sugars, monitor feet.  - HM DIABETES FOOT EXAM  13. Need for prophylactic vaccination against Streptococcus pneumoniae (pneumococcus) - Pneumococcal conjugate vaccine 13-valent IM   Plan:   During the course of the visit the patient was educated and counseled about appropriate screening and preventive services including:    Pneumococcal vaccine   Influenza  vaccine  Td vaccine  Screening electrocardiogram  Screening mammography  Bone densitometry screening  Colorectal cancer screening  Diabetes screening  Glaucoma screening  Nutrition counseling   Advanced directives: given info/requested  Screening recommendations, referrals:  Vaccinations: Please see documentation below and orders this visit.   Nutrition assessed and recommended  Colonoscopy up to date Mammogram requested Pap smear not indicated Pelvic exam not indicated Recommended yearly ophthalmology/optometry visit for glaucoma screening and checkup Recommended yearly dental visit for hygiene and checkup Advanced directives - requested  Conditions/risks identified: BMI: Discussed weight loss, diet, and increase physical activity.  Increase physical activity: AHA recommends 150 minutes of physical activity a week.  Medications reviewed DEXA- declined Diabetes is not at goal, ACE/ARB therapy: Yes. Urinary Incontinence is an issue: discussed non pharmacology and pharmacology options.  Fall risk: high- discussed PT, home fall assessment, medications.    Subjective:   Janet Choi is a 73 y.o. female who presents for Medicare Annual Wellness Visit and 3 month follow up on hypertension,diabetes, hyperlipidemia, vitamin D def, poor compliance.  Date of last medicare wellness visit is unknown.   Her blood pressure has been controlled at home, she is on verapamil, lasix, today [name] is BP: 122/78 mmHg She does not workout. She denies chest pain, shortness of breath, dizziness.  She is on cholesterol medication, lopid BID and zetia and can not tolerate statins, denies myalgias. Her cholesterol is not at goal. The cholesterol last visit was:   Lab Results  Component Value Date   CHOL 435* 11/28/2013   HDL 38* 11/28/2013   LDLCALC NOT CALC 11/28/2013   TRIG 842* 11/28/2013   CHOLHDL 11.4 11/28/2013   She has been working  on diet and exercise for diabetes, on  bASA not on ACE/ARB, Metformin, Invokana 300mg  1/2 and Amaryl 4mg  BID, she has P. Neuropathy due to DM, and CKD stage III due to DM, and denies hypoglycemia , polydipsia, polyuria and visual disturbances. Last A1C in the office was:  Lab Results  Component Value Date   HGBA1C 11.4* 11/28/2013   Lab Results  Component Value Date   CREATININE 1.62* 11/28/2013   BUN 32* 11/28/2013   NA 136 11/28/2013   K 4.8 11/28/2013   CL 99 11/28/2013   CO2 20 11/28/2013  Last GFR was 31 Patient is on Vitamin D supplement. Lab Results  Component Value Date   VD25OH 47 11/28/2013   BMI is Body mass index is 38.24 kg/(m^2)., she is working on diet and exercise and has done well.  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   Names of Other Physician/Practitioners you currently use: 1. [facility]- here for primary care 2. Dr. Satira Sark, eye doctor, last visit 01/30/2014 3. Dr. Luretha Rued, dentist, last visit q 6 months 4. Dr. Lorin Mercy, spinal stenosis.  Patient Care Team: [name], MD as PCP - General (Internal Medicine) Griffith Citron, MD as Consulting Physician (Ophthalmology) Inda Castle, MD as Consulting Physician (Gastroenterology) Larey Dresser, MD as Consulting Physician (Cardiology) Nobie Putnam, MD as Consulting Physician (Oncology)  Medication Review Current Outpatient Prescriptions on File Prior to Visit  Medication Sig Dispense Refill  . aspirin EC 81 MG tablet Take 1 tablet (81 mg total) by mouth daily. 150 tablet 2  . Canagliflozin (INVOKANA) 300 MG TABS Take 1 tablet (300 mg total) by mouth daily at 6 (six) AM. Take 1/2 tab    . Cholecalciferol (VITAMIN D) 2000 UNITS tablet Take 2,000 Units by mouth daily.    . clonazePAM (KLONOPIN) 1 MG tablet Take 1 tablet (1 mg total) by mouth at bedtime as needed for  anxiety. 90 tablet 1  . diazepam (VALIUM) 5 MG tablet Take 1/2 to 1 tablet 3 x day as needed for Vertigo 50 tablet 1  . doxycycline (VIBRAMYCIN) 100 MG capsule Take 2 capsules at once on a full stomach, then take 1 capsule  2 x day on a full stomach for infection for 2 weeks 30 capsule 0  . FLUoxetine (PROZAC) 40 MG capsule Take 1 capsule (40 mg total) by mouth daily. 90 capsule 0  . furosemide (LASIX) 40 MG tablet Take 0.5 tablets (20 mg total) by mouth daily. 90 tablet 2  . gabapentin (NEURONTIN) 400 MG capsule Take 1 capsule (400 mg total) by mouth 2 (two) times daily. 180 capsule 0  . gemfibrozil (LOPID) 600 MG tablet Take 1 tablet (600 mg total) by mouth 2 (two) times daily before a meal. 180 tablet 3  . glimepiride (AMARYL) 4 MG tablet Take 1 tablet (4 mg total) by mouth 2 (two) times daily. 180 tablet 2  . HYDROcodone-acetaminophen (NORCO/VICODIN) 5-325 MG per tablet Take 1/2 to 1 tablet every 4 hours if needed for severe pain 30 tablet 0  . hydrOXYzine (ATARAX/VISTARIL) 25 MG tablet Take 1 tablet (25 mg total) by mouth 3 (three) times daily as needed for itching. 90 tablet 0  . IRON, FERROUS GLUCONATE, PO Take 45 mg by mouth.    . lidocaine (LIDODERM) 5 %  Place 1 patch onto the skin every 12 (twelve) hours. Remove & Discard patch within 12 hours or as directed by MD 30 patch 3  . losartan (COZAAR) 50 MG tablet Take 1 tablet (50 mg total) by mouth daily. 90 tablet 2  . meloxicam (MOBIC) 15 MG tablet Take 1 tablet (15 mg total) by mouth daily. 90 tablet 3  . metFORMIN (GLUCOPHAGE) 1000 MG tablet Take 1 tablet (1,000 mg total) by mouth 2 (two) times daily with a meal. 180 tablet 1  . nortriptyline (PAMELOR) 25 MG capsule Take 1 capsule 3 x day for diabetic neuropathy  (to replace amitriptyline) 270 capsule 1  . OVER THE COUNTER MEDICATION [facility] acid reduced 75 mg at bedtime    . Red Yeast Rice 600 MG CAPS Take by mouth 2 (two) times daily.    Marland Kitchen rOPINIRole (REQUIP) 1 MG tablet Take 1 tablet (1 mg  total) by mouth 4 (four) times daily. 360 tablet 2  . verapamil (COVERA HS) 180 MG (CO) 24 hr tablet Take 1 tablet (180 mg total) by mouth at bedtime. 90 tablet 2   No current facility-administered medications on file prior to visit.    Current Problems (verified) Patient Active Problem List   Diagnosis Date Noted  . Compliance poor 11/28/2013  . Encounter for long-term (current) use of other medications 09/12/2013  . Morbid obesity (BMI 39) 05/14/2013  . Vitamin D Deficiency 05/14/2013  . T2 NIDDM w/Stage 3 CKD (GFR 39 ml/min) 02/24/2009  . Hyperlipidemia 02/24/2009  . Hypertension 02/24/2009  . Diverticulosis 02/24/2009  . Hx/o colonic polyps 02/24/2009    Screening Tests Health Maintenance  Topic Date Due  . OPHTHALMOLOGY EXAM  11/13/1950  . COLONOSCOPY  11/13/1990  . ZOSTAVAX  11/12/2000  . MAMMOGRAM  02/15/2013  . TETANUS/TDAP  03/19/2014  . HEMOGLOBIN A1C  05/31/2014  . FOOT EXAM  11/29/2014  . URINE MICROALBUMIN  11/29/2014  . INFLUENZA VACCINE  12/09/2014  . PNEUMOCOCCAL POLYSACCHARIDE VACCINE AGE 50 AND OVER  Completed     Immunization History  Administered Date(s) Administered  . Hepatitis B 07/09/1999, 08/09/1999, 01/09/2000  . Influenza, High Dose Seasonal PF 03/20/2014  . Pneumococcal Polysaccharide-23 10/15/2009  . Td 03/19/2004    Preventative care: Last colonoscopy: 2010 with Dr. Deatra Ina Last mammogram: 2012 Last pap smear/pelvic exam: remote QION:[id number] [place] AB 2007 Echo 2011 EF 28-41%, diastolic grade 1 dysfunction.   Prior vaccinations: TD or Tdap: 2005 DUE  Influenza: 2015 TODAY Pneumococcal: 2011  Prevnar 13 DUE Shingles/Zostavax: declines due to cost  History reviewed: allergies, current medications, past family history, past medical history, past social history, past surgical history and problem list  Risk Factors: Osteoporosis/FallRisk: postmenopausal estrogen deficiency and dietary calcium and/or vitamin D deficiency In the past year  have you fallen or had a near fall?:Yes History of fracture in the past year: no  Tobacco History  Substance Use Topics  . Smoking status: Former Smoker    Quit date: 05/11/1983  . Smokeless tobacco: Not on file  . Alcohol Use: No   She does not smoke.  Patient is a former smoker. Are there smokers in your home (other than you)?  No  Alcohol Current alcohol use: none  Caffeine Current caffeine use: coffee 1 /day  Exercise Current exercise: no regular exercise  Nutrition/Diet Current diet: in general, an "unhealthy" diet  Cardiac risk factors: advanced age (older than 22 for men, 88 for women), diabetes mellitus, dyslipidemia, family history of premature cardiovascular disease, hypertension, obesity (  BMI >= 30 kg/m2) and sedentary lifestyle.  Depression Screen (Note: if answer to either of the following is "Yes", a more complete depression screening is indicated)   Q1: Over the past two weeks, have you felt down, depressed or hopeless? No  Q2: Over the past two weeks, have you felt little interest or pleasure in doing things? No  Have you lost interest or pleasure in daily life? No  Do you often feel hopeless? No  Do you cry easily over simple problems? No  Activities of Daily Living In your present state of health, do you have any difficulty performing the following activities?:  Driving? No Managing money?  No Feeding yourself? No Getting from bed to chair? No Climbing a flight of stairs? No Preparing food and eating?: No Bathing or showering? No Getting dressed: No Getting to the toilet? No Using the toilet:No Moving around from place to place: No   Are you sexually active?  No  Do you have more than one partner?  No  Vision Difficulties: No  Hearing Difficulties: No Do you often ask people to speak up or repeat themselves? No Do you experience ringing or noises in your ears? No Do you have difficulty understanding soft or whispered voices?  No  Cognition  Do you feel that you have a problem with memory?Yes  Do you often misplace items? Yes  Do you feel safe at home?  Yes  Advanced directives Does patient have a [place]? No Does patient have a Living Will? Yes   Objective:   Blood pressure 122/78, pulse 88, temperature 98.2 F (36.8 C), resp. rate 16, weight 221 lb (100.245 kg). Body mass index is 38.24 kg/(m^2).  General appearance: alert, no distress, WD/WN,  female Cognitive Testing  Alert? Yes  Normal Appearance?Yes  Oriented to person? Yes  Place? Yes   Time? Yes  Recall of three objects?  Yes  Can perform simple calculations? Yes  Displays appropriate judgment?Yes  Can read the correct time from a watch face?Yes  HEENT: normocephalic, sclerae anicteric, TMs pearly, nares patent, no discharge or erythema, pharynx normal Oral cavity: MMM, no lesions Neck: supple, no lymphadenopathy, no thyromegaly, no masses Heart: RRR, normal S1, S2, no murmurs Lungs: CTA bilaterally, no wheezes, rhonchi, or rales Abdomen: +bs, soft, obese non tender, non distended, no masses, no hepatomegaly, no splenomegaly Musculoskeletal: nontender, no swelling, no obvious deformity Extremities: 1-2+ edema, no cyanosis, no clubbing Pulses: 2+ symmetric, upper and lower extremities, normal cap refill Neurological: alert, oriented x 3, CN2-12 intact, strength normal upper extremities and lower extremities, sensation decreased bilateral legs up to mid shin, DTRs 2+ throughout, no cerebellar signs, gait wide and antalgic Psychiatric: normal affect, behavior normal, pleasant  Breast: defer Gyn: defer Rectal: defer  Medicare Attestation I have personally reviewed: The patient's medical and social history Their use of alcohol, tobacco or illicit drugs Their current medications and supplements The patient's functional ability including ADLs,fall risks, home safety risks, cognitive, and hearing and visual  impairment Diet and physical activities Evidence for depression or mood disorders  The patient's weight, height, BMI, and visual acuity have been recorded in the chart.  I have made referrals, counseling, and provided education to the patient based on review of the above and I have provided the patient with a written personalized care plan for preventive services.     Vicie Mutters, PA-C   03/20/2014

## 2014-03-20 NOTE — Patient Instructions (Signed)
Stop verapamil Stricter diet

## 2014-03-21 LAB — TSH: TSH: 1.552 u[IU]/mL (ref 0.350–4.500)

## 2014-03-21 LAB — CBC WITH DIFFERENTIAL/PLATELET
BASOS ABS: 0 10*3/uL (ref 0.0–0.1)
BASOS PCT: 0 % (ref 0–1)
EOS PCT: 4 % (ref 0–5)
Eosinophils Absolute: 0.2 10*3/uL (ref 0.0–0.7)
HCT: 33.8 % — ABNORMAL LOW (ref 36.0–46.0)
Hemoglobin: 11.3 g/dL — ABNORMAL LOW (ref 12.0–15.0)
Lymphocytes Relative: 26 % (ref 12–46)
Lymphs Abs: 1.2 10*3/uL (ref 0.7–4.0)
MCH: 29.7 pg (ref 26.0–34.0)
MCHC: 33.4 g/dL (ref 30.0–36.0)
MCV: 88.7 fL (ref 78.0–100.0)
Monocytes Absolute: 0.3 10*3/uL (ref 0.1–1.0)
Monocytes Relative: 7 % (ref 3–12)
NEUTROS ABS: 3 10*3/uL (ref 1.7–7.7)
Neutrophils Relative %: 63 % (ref 43–77)
Platelets: 295 10*3/uL (ref 150–400)
RBC: 3.81 MIL/uL — ABNORMAL LOW (ref 3.87–5.11)
RDW: 13.8 % (ref 11.5–15.5)
WBC: 4.7 10*3/uL (ref 4.0–10.5)

## 2014-03-21 LAB — HEPATIC FUNCTION PANEL
ALT: 11 U/L (ref 0–35)
AST: 14 U/L (ref 0–37)
Albumin: 4.3 g/dL (ref 3.5–5.2)
Alkaline Phosphatase: 99 U/L (ref 39–117)
BILIRUBIN INDIRECT: 0.2 mg/dL (ref 0.2–1.2)
Bilirubin, Direct: 0.1 mg/dL (ref 0.0–0.3)
Total Bilirubin: 0.3 mg/dL (ref 0.2–1.2)
Total Protein: 6.8 g/dL (ref 6.0–8.3)

## 2014-03-21 LAB — HEMOGLOBIN A1C
HEMOGLOBIN A1C: 9.1 % — AB (ref <5.7)
MEAN PLASMA GLUCOSE: 214 mg/dL — AB (ref <117)

## 2014-03-21 LAB — LIPID PANEL
CHOL/HDL RATIO: 9.8 ratio
Cholesterol: 343 mg/dL — ABNORMAL HIGH (ref 0–200)
HDL: 35 mg/dL — ABNORMAL LOW (ref >39)
Triglycerides: 797 mg/dL — ABNORMAL HIGH (ref <150)

## 2014-03-21 LAB — BASIC METABOLIC PANEL WITH GFR
BUN: 38 mg/dL — ABNORMAL HIGH (ref 6–23)
CALCIUM: 10.5 mg/dL (ref 8.4–10.5)
CO2: 21 meq/L (ref 19–32)
Chloride: 97 mEq/L (ref 96–112)
Creat: 1.34 mg/dL — ABNORMAL HIGH (ref 0.50–1.10)
GFR, Est African American: 45 mL/min — ABNORMAL LOW
GFR, Est Non African American: 39 mL/min — ABNORMAL LOW
GLUCOSE: 282 mg/dL — AB (ref 70–99)
POTASSIUM: 5.1 meq/L (ref 3.5–5.3)
SODIUM: 134 meq/L — AB (ref 135–145)

## 2014-03-21 LAB — MAGNESIUM: Magnesium: 2.1 mg/dL (ref 1.5–2.5)

## 2014-03-21 LAB — VITAMIN D 25 HYDROXY (VIT D DEFICIENCY, FRACTURES): Vit D, 25-Hydroxy: 47 ng/mL (ref 30–89)

## 2014-04-10 ENCOUNTER — Other Ambulatory Visit: Payer: Self-pay

## 2014-04-15 ENCOUNTER — Other Ambulatory Visit: Payer: Self-pay | Admitting: *Deleted

## 2014-04-15 DIAGNOSIS — E1365 Other specified diabetes mellitus with hyperglycemia: Principal | ICD-10-CM

## 2014-04-15 DIAGNOSIS — N183 Chronic kidney disease, stage 3 (moderate): Principal | ICD-10-CM

## 2014-04-15 DIAGNOSIS — E1322 Other specified diabetes mellitus with diabetic chronic kidney disease: Secondary | ICD-10-CM

## 2014-04-15 DIAGNOSIS — IMO0002 Reserved for concepts with insufficient information to code with codable children: Secondary | ICD-10-CM

## 2014-04-15 MED ORDER — CANAGLIFLOZIN 300 MG PO TABS
300.0000 mg | ORAL_TABLET | Freq: Every day | ORAL | Status: DC
Start: 2014-04-15 — End: 2014-11-18

## 2014-04-23 ENCOUNTER — Ambulatory Visit (INDEPENDENT_AMBULATORY_CARE_PROVIDER_SITE_OTHER): Payer: Commercial Managed Care - HMO | Admitting: Physician Assistant

## 2014-04-23 VITALS — BP 140/78 | HR 76 | Temp 98.6°F | Resp 16 | Ht 63.75 in | Wt 206.0 lb

## 2014-04-23 DIAGNOSIS — E1365 Other specified diabetes mellitus with hyperglycemia: Principal | ICD-10-CM

## 2014-04-23 DIAGNOSIS — N183 Chronic kidney disease, stage 3 (moderate): Principal | ICD-10-CM

## 2014-04-23 DIAGNOSIS — E1329 Other specified diabetes mellitus with other diabetic kidney complication: Secondary | ICD-10-CM

## 2014-04-23 DIAGNOSIS — E782 Mixed hyperlipidemia: Secondary | ICD-10-CM

## 2014-04-23 DIAGNOSIS — B373 Candidiasis of vulva and vagina: Secondary | ICD-10-CM

## 2014-04-23 DIAGNOSIS — B3731 Acute candidiasis of vulva and vagina: Secondary | ICD-10-CM

## 2014-04-23 DIAGNOSIS — IMO0002 Reserved for concepts with insufficient information to code with codable children: Secondary | ICD-10-CM

## 2014-04-23 DIAGNOSIS — E1322 Other specified diabetes mellitus with diabetic chronic kidney disease: Secondary | ICD-10-CM

## 2014-04-23 LAB — BASIC METABOLIC PANEL WITH GFR
BUN: 27 mg/dL — ABNORMAL HIGH (ref 6–23)
CO2: 23 meq/L (ref 19–32)
Calcium: 9.8 mg/dL (ref 8.4–10.5)
Chloride: 100 mEq/L (ref 96–112)
Creat: 1.62 mg/dL — ABNORMAL HIGH (ref 0.50–1.10)
GFR, EST AFRICAN AMERICAN: 36 mL/min — AB
GFR, Est Non African American: 31 mL/min — ABNORMAL LOW
Glucose, Bld: 353 mg/dL — ABNORMAL HIGH (ref 70–99)
POTASSIUM: 5.1 meq/L (ref 3.5–5.3)
Sodium: 139 mEq/L (ref 135–145)

## 2014-04-23 LAB — MAGNESIUM: Magnesium: 2.2 mg/dL (ref 1.5–2.5)

## 2014-04-23 MED ORDER — HYDROCODONE-ACETAMINOPHEN 5-325 MG PO TABS: 1.0000 | ORAL_TABLET | Freq: Four times a day (QID) | ORAL | Status: DC | PRN

## 2014-04-23 MED ORDER — GEMFIBROZIL 600 MG PO TABS: 600.0000 mg | ORAL_TABLET | Freq: Two times a day (BID) | ORAL | Status: DC

## 2014-04-23 MED ORDER — FLUCONAZOLE 150 MG PO TABS: 150.0000 mg | ORAL_TABLET | Freq: Every day | ORAL | Status: DC

## 2014-04-23 MED ORDER — GLIMEPIRIDE 4 MG PO TABS: 4.0000 mg | ORAL_TABLET | Freq: Two times a day (BID) | ORAL | Status: DC

## 2014-04-23 NOTE — Progress Notes (Signed)
Diabetes Education and Follow-Up Visit  73 y.o.female presents for diabetic education. She has DM II with CKD stage 3 due to DM, p. Neuropathy, and morbid obesity. Due to her kidney function at her last visit her metformin was cut back and she stopped her invokana 300mg  (1/2) but she states she is still taking it. Has had a yeast infection, last one was 2014. Marland Kitchen She has a history of noncomplance and last visit we had a long discusion about weight loss/diet/compliance. She has stopped her lipitor, stopped gabapentin and amitriptyline, has increased walked, decreased portion sizes, and has lost 15 lbs.  She complains of paresthesia of the feet. The patient is not checking her sugars at home. She is on Lopid BID for cholesterol and declines statins.   She does complain of fatigue but states she has been staying up late doing christmas things and not wearing her CPAP.   Heading to the beach for [place] and has seen Dr. Berenice Primas for left arm pain, states nothing to be done. She is asking for norco for her beach trip.   Low fat/carbohydrate diet?  Yes Nicotine Abuse?  No Medication Compliance?  Yes Exercise?  Yes Alcohol Abuse?  No  Wt Readings from Last 3 Encounters:  04/23/14 206 lb (93.441 kg)  03/20/14 221 lb (100.245 kg)  01/31/14 221 lb (100.245 kg)   ROS: no polyuria or polydipsia, no chest pain, dyspnea or TIA's, no hypoglycemia, weight has decreased, has dysesthesias in the feet  Physical Exam: Blood pressure 140/78, pulse 76, temperature 98.6 F (37 C), resp. rate 16, height 5' 3.75" (1.619 m), weight 206 lb (93.441 kg). Body mass index is 35.65 kg/(m^2). General Appearance:  alert, oriented, no acute distress and obese heart sounds regular rate and rhythm, S1, S2 normal, no murmur, click, rub or gallop, chest clear, no hepatosplenomegaly, no carotid bruits, feet: reduced sensation at decreased bilateral legs up to mid shin, but more sensation than last visit.   Labs: Lab Results   Component Value Date   HGBA1C 9.1* 03/20/2014    Lab Results  Component Value Date   MICROALBUR 1.28 11/28/2013    Lab Results  Component Value Date   CHOL 343* 03/20/2014   HDL 35* 03/20/2014   LDLCALC NOT CALC 03/20/2014   TRIG 797* 03/20/2014   CHOLHDL 9.8 03/20/2014     Assessment: 1.  Diabetes type II - will check BMP since has decreased metformin but not the invokana ( only on 1/2), she has lost a significant amount of weight.  2. BP is not at goal. 3. Cholesterol is not at goal.  4. Obesity 5. OSA- not on CPAP right now/fatigue 6. CKD- recheck BMP/GFR 7. Yeast infection 8. Left arm pain, has seen Dr. Berenice Primas  Plan: Discussed general issues about diabetes pathophysiology and management. Counseling at today's visit: discussed the need for weight loss. Addressed ADA diet. Encouraged aerobic exercise. Discussed foot care. Reminded to get yearly retinal exam. Labs: fasting blood sugar. Reminded to bring in blood sugar diary at next visit. Follow up in 2 months or as needed.  Diflucan 150mg  for yeast PRN Will send back in lopid 600 BID Norco 5/325 #60 NR  Recommendations: 1.  Patient is counseled on appropriate foot care. 2.  BP goal < 130/80. 3.  LDL goal of < 100, HDL > 40 and TG < 150. 4.  Eye Exam yearly and Dental Exam every 6 months. 5.  Dietary recommendations 6.  Physical Activity recommendations   OVER  30 minutes of exam, counseling, chart review, referral performed

## 2014-04-23 NOTE — Patient Instructions (Signed)
Keep up great work! Please be careful with the glimepiride (Amaryl). This medication forces your blood sugar down no matter what it is starting at. This can cause diabetics to have to eat more to keep their blood sugar elevated which goes against what our goals are for you. Only take 1/2 of the medication if your sugar is above 120 and you can take a whole pill if your sugar is above 150 in the morning. If at any time you start to have low blood sugars in the morning or during the day please stop this medication. Please never take this medication if you are sick or can not eat. A low blood sugar is much more dangerous than a high blood sugar. Your brain needs two things, sugar and oxygen.      Bad carbs also include fruit juice, alcohol, and sweet tea. These are empty calories that do not signal to your brain that you are full.   Please remember the good carbs are still carbs which convert into sugar. So please measure them out no more than 1/2-1 cup of rice, oatmeal, pasta, and beans  Veggies are however free foods! Pile them on.   Not all fruit is created equal. Please see the list below, the fruit at the bottom is higher in sugars than the fruit at the top. Please avoid all dried fruits.     -Take Nystatin Cream as prescribed.  -Take Diflucan as prescribed. Try taking one pill at first. Then if you are still feeling the itching, get the second pill refilled the next week.    Directions for applying anti-fungal cream:  -Wash and dry the area first.  -Apply the cream, beginning just outside the area of the rash and moving toward the center.  -Be sure to wash and dry your hands afterward.  To prevent the infection from spreading:  -Wash all towels in warm, soapy water and then dry them.  -Use a new towel and washcloth every time you wash.  -Clean sinks, bathtubs, and bathroom floors well after each use.  -Wear clean clothes every day and do not share clothes.   We want weight loss that  will last so you should lose 1-2 pounds a week.  THAT IS IT! Please pick THREE things a month to change. Once it is a habit check off the item. Then pick another three items off the list to become habits.  If you are already doing a habit on the list GREAT!  Cross that item off! o Don't drink your calories. Ie, alcohol, soda, fruit juice, and sweet tea.  o Drink more water. Drink a glass when you feel hungry or before each meal.  o Eat breakfast - Complex carb and protein (likeDannon light and fit yogurt, oatmeal, fruit, eggs, [place] bacon). o Measure your cereal.  Eat no more than one cup a day. (ie [place]) o Eat an apple a day. o Add a vegetable a day. o Try a new vegetable a month. o Use Pam! Stop using oil or butter to cook. o Don't finish your plate or use smaller plates. o Share your dessert. o Eat sugar free Jello for dessert or frozen grapes. o Don't eat 2-3 hours before bed. o Switch to whole wheat bread, pasta, and brown rice. o Make healthier choices when you eat out. No fries! o Pick baked chicken, NOT fried. o Don't forget to SLOW DOWN when you eat. It is not going anywhere.  o Take the stairs. o  Park far away in the parking lot o [employer] (or weights) for 10 minutes while watching TV. o Walk at work for 10 minutes during break. o Walk outside 1 time a week with your friend, kids, dog, or significant other. o Start a walking group [place] as much as you can tolerate.  o Keep a food diary. o Weigh yourself daily. o Walk for 15 minutes 3 days per week. o Cook at home more often and eat out less.  If life happens and you go back to old habits, it is okay.  Just start over. You can do it!   If you experience chest pain, get short of breath, or tired during the exercise, please stop immediately and inform your doctor.

## 2014-04-26 LAB — FRUCTOSAMINE: FRUCTOSAMINE: 466 umol/L — AB (ref 190–270)

## 2014-04-30 ENCOUNTER — Ambulatory Visit: Payer: Self-pay | Admitting: Physician Assistant

## 2014-05-24 ENCOUNTER — Ambulatory Visit (INDEPENDENT_AMBULATORY_CARE_PROVIDER_SITE_OTHER): Payer: PPO

## 2014-05-24 DIAGNOSIS — R6889 Other general symptoms and signs: Secondary | ICD-10-CM

## 2014-05-24 LAB — BASIC METABOLIC PANEL WITH GFR
BUN: 26 mg/dL — ABNORMAL HIGH (ref 6–23)
CALCIUM: 9.6 mg/dL (ref 8.4–10.5)
CHLORIDE: 101 meq/L (ref 96–112)
CO2: 22 meq/L (ref 19–32)
Creat: 1.36 mg/dL — ABNORMAL HIGH (ref 0.50–1.10)
GFR, EST NON AFRICAN AMERICAN: 39 mL/min — AB
GFR, Est African American: 45 mL/min — ABNORMAL LOW
Glucose, Bld: 387 mg/dL — ABNORMAL HIGH (ref 70–99)
Potassium: 4.7 mEq/L (ref 3.5–5.3)
Sodium: 134 mEq/L — ABNORMAL LOW (ref 135–145)

## 2014-05-24 LAB — MAGNESIUM: Magnesium: 2.2 mg/dL (ref 1.5–2.5)

## 2014-05-24 NOTE — Progress Notes (Signed)
Patient ID: Janet Choi, female   DOB: [birth year], 74 y.o.   MRN: [id number] Patient here today to recheck abnormal labs. Recheck BMP and Magnesium

## 2014-06-10 ENCOUNTER — Ambulatory Visit: Payer: Self-pay | Admitting: Physician Assistant

## 2014-06-14 ENCOUNTER — Ambulatory Visit: Payer: Self-pay | Admitting: Physician Assistant

## 2014-06-16 ENCOUNTER — Other Ambulatory Visit: Payer: Self-pay | Admitting: Internal Medicine

## 2014-06-16 ENCOUNTER — Other Ambulatory Visit: Payer: Self-pay | Admitting: Physician Assistant

## 2014-06-16 DIAGNOSIS — G2581 Restless legs syndrome: Secondary | ICD-10-CM

## 2014-06-20 ENCOUNTER — Encounter: Payer: Self-pay | Admitting: Physician Assistant

## 2014-06-20 ENCOUNTER — Ambulatory Visit (INDEPENDENT_AMBULATORY_CARE_PROVIDER_SITE_OTHER): Payer: PPO | Admitting: Physician Assistant

## 2014-06-20 VITALS — BP 158/80 | HR 90 | Temp 97.8°F | Resp 18 | Ht 63.75 in | Wt 206.0 lb

## 2014-06-20 DIAGNOSIS — G2581 Restless legs syndrome: Secondary | ICD-10-CM

## 2014-06-20 DIAGNOSIS — E782 Mixed hyperlipidemia: Secondary | ICD-10-CM

## 2014-06-20 DIAGNOSIS — E1365 Other specified diabetes mellitus with hyperglycemia: Secondary | ICD-10-CM

## 2014-06-20 DIAGNOSIS — E1322 Other specified diabetes mellitus with diabetic chronic kidney disease: Secondary | ICD-10-CM

## 2014-06-20 DIAGNOSIS — I10 Essential (primary) hypertension: Secondary | ICD-10-CM

## 2014-06-20 DIAGNOSIS — Z79899 Other long term (current) drug therapy: Secondary | ICD-10-CM

## 2014-06-20 DIAGNOSIS — IMO0002 Reserved for concepts with insufficient information to code with codable children: Secondary | ICD-10-CM

## 2014-06-20 DIAGNOSIS — E1329 Other specified diabetes mellitus with other diabetic kidney complication: Secondary | ICD-10-CM

## 2014-06-20 DIAGNOSIS — N183 Chronic kidney disease, stage 3 (moderate): Secondary | ICD-10-CM

## 2014-06-20 DIAGNOSIS — E1143 Type 2 diabetes mellitus with diabetic autonomic (poly)neuropathy: Secondary | ICD-10-CM

## 2014-06-20 DIAGNOSIS — Z9119 Patient's noncompliance with other medical treatment and regimen: Secondary | ICD-10-CM

## 2014-06-20 DIAGNOSIS — Z91199 Patient's noncompliance with other medical treatment and regimen due to unspecified reason: Secondary | ICD-10-CM

## 2014-06-20 DIAGNOSIS — E559 Vitamin D deficiency, unspecified: Secondary | ICD-10-CM

## 2014-06-20 LAB — CBC WITH DIFFERENTIAL/PLATELET
BASOS ABS: 0 10*3/uL (ref 0.0–0.1)
BASOS PCT: 0 % (ref 0–1)
EOS ABS: 0.2 10*3/uL (ref 0.0–0.7)
Eosinophils Relative: 3 % (ref 0–5)
HCT: 40.1 % (ref 36.0–46.0)
Hemoglobin: 13.4 g/dL (ref 12.0–15.0)
Lymphocytes Relative: 25 % (ref 12–46)
Lymphs Abs: 1.3 10*3/uL (ref 0.7–4.0)
MCH: 29.3 pg (ref 26.0–34.0)
MCHC: 33.4 g/dL (ref 30.0–36.0)
MCV: 87.7 fL (ref 78.0–100.0)
MONOS PCT: 6 % (ref 3–12)
MPV: 10.7 fL (ref 8.6–12.4)
Monocytes Absolute: 0.3 10*3/uL (ref 0.1–1.0)
NEUTROS ABS: 3.4 10*3/uL (ref 1.7–7.7)
NEUTROS PCT: 66 % (ref 43–77)
PLATELETS: 291 10*3/uL (ref 150–400)
RBC: 4.57 MIL/uL (ref 3.87–5.11)
RDW: 13.6 % (ref 11.5–15.5)
WBC: 5.1 10*3/uL (ref 4.0–10.5)

## 2014-06-20 LAB — LIPID PANEL
Cholesterol: 374 mg/dL — ABNORMAL HIGH (ref 0–200)
HDL: 39 mg/dL — AB (ref >39)
TRIGLYCERIDES: 666 mg/dL — AB (ref <150)
Total CHOL/HDL Ratio: 9.6 Ratio

## 2014-06-20 LAB — BASIC METABOLIC PANEL WITH GFR
BUN: 32 mg/dL — ABNORMAL HIGH (ref 6–23)
CALCIUM: 9.6 mg/dL (ref 8.4–10.5)
CO2: 21 mEq/L (ref 19–32)
Chloride: 98 mEq/L (ref 96–112)
Creat: 1.46 mg/dL — ABNORMAL HIGH (ref 0.50–1.10)
GFR, EST AFRICAN AMERICAN: 41 mL/min — AB
GFR, Est Non African American: 35 mL/min — ABNORMAL LOW
GLUCOSE: 354 mg/dL — AB (ref 70–99)
Potassium: 4.2 mEq/L (ref 3.5–5.3)
Sodium: 135 mEq/L (ref 135–145)

## 2014-06-20 LAB — HEPATIC FUNCTION PANEL
ALK PHOS: 101 U/L (ref 39–117)
ALT: 13 U/L (ref 0–35)
AST: 12 U/L (ref 0–37)
Albumin: 4.5 g/dL (ref 3.5–5.2)
BILIRUBIN DIRECT: 0.1 mg/dL (ref 0.0–0.3)
BILIRUBIN TOTAL: 0.4 mg/dL (ref 0.2–1.2)
Indirect Bilirubin: 0.3 mg/dL (ref 0.2–1.2)
TOTAL PROTEIN: 6.9 g/dL (ref 6.0–8.3)

## 2014-06-20 LAB — MAGNESIUM: Magnesium: 1.9 mg/dL (ref 1.5–2.5)

## 2014-06-20 NOTE — Patient Instructions (Signed)
Diabetes is a very complicated disease...lets simplify it.  An easy way to look at it to understand the complications is if you think of the extra sugar floating in your blood stream as glass shards floating through your blood stream.    Diabetes affects your small vessels first: 1) The glass shards (sugar) scraps down the tiny blood vessels in your eyes and lead to diabetic retinopathy, the leading cause of blindness in the [place]. Diabetes is the leading cause of newly diagnosed adult (74 to 74 years of age) blindness in the [place].  2) The glass shards scratches down the tiny vessels of your legs leading to nerve damage called neuropathy and can lead to amputations of your feet. More than 60% of all non-traumatic amputations of lower limbs occur in people with diabetes.  3) Over time the small vessels in your brain are shredded and closed off, individually this does not cause any problems but over a long period of time many of the small vessels being blocked can lead to Vascular Dementia.   4) Your kidney's are a filter system and have a "net" that keeps certain things in the body and lets bad things out. Sugar shreds this net and leads to kidney damage and eventually failure. Decreasing the sugar that is destroying the net and certain blood pressure medications can help stop or decrease progression of kidney disease. Diabetes was the primary cause of kidney failure in 44 percent of all new cases in 2011.  5) Diabetes also destroys the small vessels in your penis that lead to erectile dysfunction. Eventually the vessels are so damaged that you may not be responsive to cialis or viagra.   Diabetes and your large vessels: Your larger vessels consist of your coronary arteries in your heart and the carotid vessels to your brain. Diabetes or even increased sugars put you at 300% increased risk of heart attack and stroke and this is why.. The sugar scrapes down your large blood vessels and your body  sees this as an internal injury and tries to repair itself. Just like you get a scab on your skin, your platelets will stick to the blood vessel wall trying to heal it. This is why we have diabetics on low dose aspirin daily, this prevents the platelets from sticking and can prevent plaque formation. In addition, your body takes cholesterol and tries to shove it into the open wound. This is why we want your LDL, or bad cholesterol, below 70.   The combination of platelets and cholesterol over 5-10 years forms plaque that can break off and cause a heart attack or stroke.   PLEASE REMEMBER:  Diabetes is preventable! Up to 42 percent of complications and morbidities among individuals with type 2 diabetes can be prevented, delayed, or effectively treated and minimized with regular visits to a health professional, appropriate monitoring and medication, and a healthy diet and lifestyle.  Peripheral Neuropathy Peripheral neuropathy is a type of nerve damage. It affects nerves that carry signals between the spinal cord and other parts of the body. These are called peripheral nerves. With peripheral neuropathy, one nerve or a group of nerves may be damaged.  CAUSES  Many things can damage peripheral nerves. For some people with peripheral neuropathy, the cause is unknown. Some causes include:  Diabetes. This is the most common cause of peripheral neuropathy.  Injury to a nerve.  Pressure or stress on a nerve that lasts a long time.  Too little vitamin B. Alcoholism  can lead to this.  Infections.  Autoimmune diseases, such as multiple sclerosis and systemic lupus erythematosus.  Inherited nerve diseases.  Some medicines, such as cancer drugs.  Toxic substances, such as lead and mercury.  Too little blood flowing to the legs.  Kidney disease.  Thyroid disease. SIGNS AND SYMPTOMS  Different people have different symptoms. The symptoms you have will depend on which of your nerves is damaged.  Common symptoms include:  Loss of feeling (numbness) in the feet and hands.  Tingling in the feet and hands.  Pain that burns.  Very sensitive skin.  Weakness.  Not being able to move a part of the body (paralysis).  Muscle twitching.  Clumsiness or poor coordination.  Loss of balance.  Not being able to control your bladder.  Feeling dizzy.  Sexual problems. DIAGNOSIS  Peripheral neuropathy is a symptom, not a disease. Finding the cause of peripheral neuropathy can be hard. To figure that out, your health care provider will take a medical history and do a physical exam. A neurological exam will also be done. This involves checking things affected by your brain, spinal cord, and nerves (nervous system). For example, your health care provider will check your reflexes, how you move, and what you can feel.  Other types of tests may also be ordered, such as:  Blood tests.  A test of the fluid in your spinal cord.  Imaging tests, such as CT scans or an MRI.  Electromyography (EMG). This test checks the nerves that control muscles.  Nerve conduction velocity tests. These tests check how fast messages pass through your nerves.  Nerve biopsy. A small piece of nerve is removed. It is then checked under a microscope. TREATMENT   Medicine is often used to treat peripheral neuropathy. Medicines may include:  Pain-relieving medicines. Prescription or over-the-counter medicine may be suggested.  Antiseizure medicine. This may be used for pain.  Antidepressants. These also may help ease pain from neuropathy.  Lidocaine. This is a numbing medicine. You might wear a patch or be given a shot.  Mexiletine. This medicine is typically used to help control irregular heart rhythms.  Surgery. Surgery may be needed to relieve pressure on a nerve or to destroy a nerve that is causing pain.  Physical therapy to help movement.  Assistive devices to help movement. HOME CARE  INSTRUCTIONS   Only take over-the-counter or prescription medicines as directed by your health care provider. Follow the instructions carefully for any given medicines. Do not take any other medicines without first getting approval from your health care provider.  If you have diabetes, work closely with your health care provider to keep your blood sugar under control.  If you have numbness in your feet:  Check every day for signs of injury or infection. Watch for redness, warmth, and swelling.  Wear padded socks and comfortable shoes. These help protect your feet.  Do not do things that put pressure on your damaged nerve.  Do not smoke. Smoking keeps blood from getting to damaged nerves.  Avoid or limit alcohol. Too much alcohol can cause a lack of B vitamins. These vitamins are needed for healthy nerves.  Develop a good support system. Coping with peripheral neuropathy can be stressful. Talk to a mental health specialist or join a support group if you are struggling.  Follow up with your health care provider as directed. SEEK MEDICAL CARE IF:   You have new signs or symptoms of peripheral neuropathy.  You are struggling  emotionally from dealing with peripheral neuropathy.  You have a fever. SEEK IMMEDIATE MEDICAL CARE IF:   You have an injury or infection that is not healing.  You feel very dizzy or begin vomiting.  You have chest pain.  You have trouble breathing. Document Released: 04/16/2002 Document Revised: 01/06/2011 Document Reviewed: 01/01/2013 [facility] Patient Information 2015 [place], [place]. This information is not intended to replace advice given to you by your health care provider. Make sure you discuss any questions you have with your health care provider.

## 2014-06-20 NOTE — Progress Notes (Signed)
Assessment and Plan:  Hypertension: Continue medication, monitor blood pressure at home call if greater than 140/90. Continue DASH diet.  Reminder to go to the ER if any CP, SOB, nausea, dizziness, severe HA, changes vision/speech, left arm numbness and tingling and jaw pain. Cholesterol: Continue diet and exercise. Check cholesterol.  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 Check A1C Vitamin D Def- check level and continue medications.  Noncompliance- emphasized compliance with medications and appointments, patient expressed understanding of risks.  Obesity with co morbidities- long discussion about weight loss, diet, and exercise OSA- not on CPAP- needs to get back on it.  HA- normal neuro-? If from dehydration- check labs, if worsening HA, changes vision/speech, imbalance, weakness go to the ER DM neuropathy- will add low dose amtripyline 25mg  1-2 at supper  Continue diet and meds as discussed. Further disposition pending results of labs. Discussed med's effects and SE's.    HPI 75 y.o. female  presents for 3 month follow up with hypertension, hyperlipidemia, diabetes and vitamin D.  Her blood pressure has been controlled at home, today [name] is BP: (!) 158/80 mmHg. She complains of headaches and worsening leg numbness/tingling worse at night and feel better with moving around. She was on amitriptyline but stopped it.  Denies changes in vision/speech. However she has had 2 episodes of vertigo, starting at night when she lays down.  States that HA is worse in the cold. Admits to extra stress.   She does not workout. She denies chest pain, shortness of breath, dizziness.  She is on cholesterol medication and denies myalgias. Her cholesterol is not at goal. 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 She is not checking her BP at home. Her metfomin was decreased last time to 2 a day and she was suppose to stop the invokana but has not stopped it.  Last A1C was: 03/20/2014: Hemoglobin-A1c 9.1*  She has OSA but she is not on CPAP.   Patient is on Vitamin D supplement. 03/20/2014: Vit D, 25-Hydroxy 47  BMI is Body mass index is 35.65 kg/(m^2)., she is working on diet and exercise. Wt Readings from Last 3 Encounters:  06/20/14 206 lb (93.441 kg)  04/23/14 206 lb (93.441 kg)  03/20/14 221 lb (100.245 kg)    Current Medications:  Current Outpatient Prescriptions on File Prior to Visit  Medication Sig Dispense Refill  . aspirin EC 81 MG tablet Take 1 tablet (81 mg total) by mouth daily. 150 tablet 2  . canagliflozin (INVOKANA) 300 MG TABS tablet Take 300 mg by mouth daily at 6 (six) AM. Take 1/2 tab 30 tablet 11  . Cholecalciferol (VITAMIN D) 2000 UNITS tablet Take 2,000 Units by mouth daily.    . clonazePAM (KLONOPIN) 1 MG tablet Take 1 tablet (1 mg total) by mouth at bedtime as needed for anxiety. 90 tablet 1  . diazepam (VALIUM) 5 MG tablet Take 1/2 to 1 tablet 3 x day as needed for Vertigo 50 tablet 1  . FLUoxetine (PROZAC) 40 MG capsule TAKE 1 CAPSULE (40 MG TOTAL) BY MOUTH DAILY. 90 capsule 1  . furosemide (LASIX) 40 MG tablet Take 0.5 tablets (20 mg total)  by mouth daily. 90 tablet 2  . gemfibrozil (LOPID) 600 MG tablet TAKE 1 TABLET (600 MG TOTAL) BY MOUTH 2 (TWO) TIMES DAILY BEFORE A MEAL. 180 tablet 99  . glimepiride (AMARYL) 4 MG tablet TAKE 1 TABLET (4 MG TOTAL) BY MOUTH 2 (TWO) TIMES DAILY. 180 tablet 99  . HYDROcodone-acetaminophen (NORCO) 5-325 MG per tablet Take 1 tablet by mouth every 6 (six) hours as needed for moderate pain (for cough). 60 tablet 0  . hydrOXYzine (ATARAX/VISTARIL) 25 MG tablet Take  1 tablet (25 mg total) by mouth 3 (three) times daily as needed for itching. 90 tablet 0  . lidocaine (LIDODERM) 5 % Place 1 patch onto the skin every 12 (twelve) hours. Remove & Discard patch within 12 hours or as directed by MD 30 patch 3  . losartan (COZAAR) 50 MG tablet Take 1 tablet (50 mg total) by mouth daily. 90 tablet 2  . meloxicam (MOBIC) 15 MG tablet Take 1 tablet (15 mg total) by mouth daily. 90 tablet 3  . metFORMIN (GLUCOPHAGE) 1000 MG tablet Take 1 tablet (1,000 mg total) by mouth 2 (two) times daily with a meal. 180 tablet 1  . OVER THE COUNTER MEDICATION [facility] acid reduced 75 mg at bedtime    . Red Yeast Rice 600 MG CAPS Take by mouth 2 (two) times daily.    Marland Kitchen rOPINIRole (REQUIP) 1 MG tablet TAKE 1 TABLET 4 TIMES A DAY FOR RESTLESS LEGS 120 tablet 5  . fluconazole (DIFLUCAN) 150 MG tablet Take 1 tablet (150 mg total) by mouth daily. (Patient not taking: Reported on 06/20/2014) 3 tablet 0   No current facility-administered medications on file prior to visit.   Medical History:  Past Medical History  Diagnosis Date  . Diabetes mellitus without complication   . Hypertension   . Hyperlipidemia   . OSA (obstructive sleep apnea)   . DJD (degenerative joint disease)   . RLS (restless legs syndrome)   . Obesity   . Vitamin D deficiency    Allergies:  Allergies  Allergen Reactions  . Aspirin   . Crestor [Rosuvastatin]   . Lipitor [Atorvastatin]   . Lyrica [Pregabalin] Other (See Comments)    Blurred vision  . Minocycline Hcl     REACTION: Reaction not known  . Oruvail [Ketoprofen]   . Tricor [Fenofibrate]   . Vasotec [Enalaprilat]   . Victoza [Liraglutide]   . Zinc      Review of Systems:  Review of Systems  Constitutional: Negative.   HENT: Negative for congestion, ear discharge, ear pain, hearing loss, nosebleeds, sore throat and tinnitus.   Eyes: Negative.   Respiratory: Negative.  Negative for stridor.   Cardiovascular: Negative.   Gastrointestinal: Negative.    Genitourinary: Negative.   Musculoskeletal: Positive for myalgias and joint pain (left shoulder, getting MRI with Dr. Berenice Primas). Negative for back pain, falls and neck pain.  Skin: Negative.   Neurological: Positive for dizziness (x 2 times, worse with lying down), tingling and headaches. Negative for tremors, sensory change, speech change, focal weakness, seizures and loss of consciousness.  Psychiatric/Behavioral: Negative.     Family history- Review and unchanged Social history- Review and unchanged Physical Exam: BP 158/80 mmHg  Pulse 90  Temp(Src) 97.8 F (36.6 C) (Temporal)  Resp 18  Ht 5' 3.75" (1.619 m)  Wt 206 lb (93.441 kg)  BMI 35.65 kg/m2 Wt Readings from Last 3 Encounters:  06/20/14 206 lb (93.441 kg)  04/23/14 206 lb (93.441 kg)  03/20/14  221 lb (100.245 kg)   General Appearance: Well nourished, in no apparent distress. Eyes: PERRLA, EOMs, conjunctiva no swelling or erythema Sinuses: No Frontal/maxillary tenderness ENT/Mouth: Ext aud canals clear, TMs without erythema, bulging. No erythema, swelling, or exudate on post pharynx.  Tonsils not swollen or erythematous. Hearing normal.  Neck: Supple, thyroid normal.  Respiratory: Respiratory effort normal, BS equal bilaterally without rales, rhonchi, wheezing or stridor.  Cardio: RRR with no MRGs. Brisk peripheral pulses with 1+ edema.  Abdomen: Soft, + BS, obese Non tender, no guarding, rebound, hernias, masses. Lymphatics: Non tender without lymphadenopathy.  Musculoskeletal: Full ROM, 5/5 strength, Normal gait Skin: Warm, dry without rashes, lesions, ecchymosis.  Neuro: Cranial nerves intact. No cerebellar symptoms. Decreased sensation to mid shin Psych: Awake and oriented X 3, normal affect, Insight and Judgment appropriate.    Vicie Mutters, PA-C 3:08 PM [facility]

## 2014-06-21 ENCOUNTER — Other Ambulatory Visit: Payer: Self-pay | Admitting: Orthopedic Surgery

## 2014-06-21 DIAGNOSIS — M7542 Impingement syndrome of left shoulder: Secondary | ICD-10-CM

## 2014-06-21 LAB — VITAMIN D 25 HYDROXY (VIT D DEFICIENCY, FRACTURES): Vit D, 25-Hydroxy: 28 ng/mL — ABNORMAL LOW (ref 30–100)

## 2014-06-21 LAB — HEMOGLOBIN A1C
Hgb A1c MFr Bld: 11.9 % — ABNORMAL HIGH (ref <5.7)
Mean Plasma Glucose: 295 mg/dL — ABNORMAL HIGH (ref <117)

## 2014-06-21 LAB — TSH: TSH: 0.857 u[IU]/mL (ref 0.350–4.500)

## 2014-07-03 ENCOUNTER — Ambulatory Visit: Payer: Self-pay | Admitting: Internal Medicine

## 2014-07-06 ENCOUNTER — Ambulatory Visit
Admission: RE | Admit: 2014-07-06 | Discharge: 2014-07-06 | Disposition: A | Discharge status: Home / Self Care | Payer: PPO | Source: Ambulatory Visit | Attending: Orthopedic Surgery | Admitting: Orthopedic Surgery

## 2014-07-06 DIAGNOSIS — M7542 Impingement syndrome of left shoulder: Secondary | ICD-10-CM

## 2014-07-16 ENCOUNTER — Other Ambulatory Visit: Payer: Self-pay | Admitting: Physician Assistant

## 2014-07-16 DIAGNOSIS — F411 Generalized anxiety disorder: Secondary | ICD-10-CM

## 2014-08-13 ENCOUNTER — Ambulatory Visit (INDEPENDENT_AMBULATORY_CARE_PROVIDER_SITE_OTHER): Payer: PPO | Admitting: Internal Medicine

## 2014-08-13 ENCOUNTER — Encounter: Payer: Self-pay | Admitting: Internal Medicine

## 2014-08-13 VITALS — BP 130/68 | HR 84 | Temp 97.0°F | Resp 18 | Ht 63.25 in | Wt 210.6 lb

## 2014-08-13 DIAGNOSIS — E1140 Type 2 diabetes mellitus with diabetic neuropathy, unspecified: Secondary | ICD-10-CM

## 2014-08-13 DIAGNOSIS — J040 Acute laryngitis: Secondary | ICD-10-CM

## 2014-08-13 DIAGNOSIS — E782 Mixed hyperlipidemia: Secondary | ICD-10-CM

## 2014-08-13 DIAGNOSIS — E559 Vitamin D deficiency, unspecified: Secondary | ICD-10-CM

## 2014-08-13 DIAGNOSIS — Z79899 Other long term (current) drug therapy: Secondary | ICD-10-CM

## 2014-08-13 DIAGNOSIS — I10 Essential (primary) hypertension: Secondary | ICD-10-CM

## 2014-08-13 DIAGNOSIS — E1129 Type 2 diabetes mellitus with other diabetic kidney complication: Secondary | ICD-10-CM

## 2014-08-13 LAB — LIPID PANEL
CHOL/HDL RATIO: 8.3 ratio
Cholesterol: 290 mg/dL — ABNORMAL HIGH (ref 0–200)
HDL: 35 mg/dL — ABNORMAL LOW (ref >=46)
Triglycerides: 519 mg/dL — ABNORMAL HIGH (ref <150)

## 2014-08-13 LAB — CBC WITH DIFFERENTIAL/PLATELET
BASOS ABS: 0 10*3/uL (ref 0.0–0.1)
BASOS PCT: 0 % (ref 0–1)
Eosinophils Absolute: 0.2 10*3/uL (ref 0.0–0.7)
Eosinophils Relative: 3 % (ref 0–5)
HCT: 36 % (ref 36.0–46.0)
Hemoglobin: 12 g/dL (ref 12.0–15.0)
LYMPHS PCT: 20 % (ref 12–46)
Lymphs Abs: 1.3 10*3/uL (ref 0.7–4.0)
MCH: 29.4 pg (ref 26.0–34.0)
MCHC: 33.3 g/dL (ref 30.0–36.0)
MCV: 88.2 fL (ref 78.0–100.0)
MPV: 10.7 fL (ref 8.6–12.4)
Monocytes Absolute: 0.3 10*3/uL (ref 0.1–1.0)
Monocytes Relative: 5 % (ref 3–12)
NEUTROS ABS: 4.6 10*3/uL (ref 1.7–7.7)
Neutrophils Relative %: 72 % (ref 43–77)
PLATELETS: 254 10*3/uL (ref 150–400)
RBC: 4.08 MIL/uL (ref 3.87–5.11)
RDW: 13.5 % (ref 11.5–15.5)
WBC: 6.4 10*3/uL (ref 4.0–10.5)

## 2014-08-13 LAB — BASIC METABOLIC PANEL WITH GFR
BUN: 21 mg/dL (ref 6–23)
CALCIUM: 8.9 mg/dL (ref 8.4–10.5)
CO2: 23 mEq/L (ref 19–32)
Chloride: 102 mEq/L (ref 96–112)
Creat: 1.12 mg/dL — ABNORMAL HIGH (ref 0.50–1.10)
GFR, EST NON AFRICAN AMERICAN: 49 mL/min — AB
GFR, Est African American: 56 mL/min — ABNORMAL LOW
GLUCOSE: 261 mg/dL — AB (ref 70–99)
POTASSIUM: 3.9 meq/L (ref 3.5–5.3)
SODIUM: 138 meq/L (ref 135–145)

## 2014-08-13 LAB — TSH: TSH: 1.641 u[IU]/mL (ref 0.350–4.500)

## 2014-08-13 LAB — HEPATIC FUNCTION PANEL
ALT: 12 U/L (ref 0–35)
AST: 13 U/L (ref 0–37)
Albumin: 4.2 g/dL (ref 3.5–5.2)
Alkaline Phosphatase: 93 U/L (ref 39–117)
BILIRUBIN INDIRECT: 0.3 mg/dL (ref 0.2–1.2)
BILIRUBIN TOTAL: 0.4 mg/dL (ref 0.2–1.2)
Bilirubin, Direct: 0.1 mg/dL (ref 0.0–0.3)
Total Protein: 6.5 g/dL (ref 6.0–8.3)

## 2014-08-13 LAB — HEMOGLOBIN A1C
Hgb A1c MFr Bld: 11.7 % — ABNORMAL HIGH (ref <5.7)
MEAN PLASMA GLUCOSE: 289 mg/dL — AB (ref <117)

## 2014-08-13 LAB — MAGNESIUM: Magnesium: 1.8 mg/dL (ref 1.5–2.5)

## 2014-08-13 MED ORDER — AZITHROMYCIN 250 MG PO TABS: ORAL_TABLET | ORAL | Status: DC

## 2014-08-13 MED ORDER — PREDNISONE 20 MG PO TABS: ORAL_TABLET | ORAL | Status: DC

## 2014-08-13 MED ORDER — TRAMADOL HCL 50 MG PO TABS: ORAL_TABLET | ORAL | Status: DC

## 2014-08-13 NOTE — Progress Notes (Signed)
Patient ID: Janet Choi, female   DOB: 1941-05-04, 74 y.o.   MRN: [id number]   This very nice 74 y.o.female presents for 3 month follow up with Hypertension, Hyperlipidemia, Pre-Diabetes and Vitamin D Deficiency.    Patient is treated for HTN & BP has been controlled at home. Today's BP: 130/68 mmHg. Patient has had no complaints of any cardiac type chest pain, palpitations, dyspnea/orthopnea/PND, dizziness, claudication, or dependent edema.   Hyperlipidemia is not controlled with diet & meds. Patient alleges intolerance to Statins and fibrates.  Last Lipids were not at goal Total with total Chol  374*; HDL-C 39*; LDL NOT CALC; very elevated Trig 666 on 06/20/2014.   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  Last A1c demonstrated her poor dietary compliance with A1c 11.9% on 06/20/2014.    Further, the patient also has history of Vitamin D Deficiency and supplements vitamin D without any suspected side-effects. Last vitamin D was 28 on  06/20/2014.  Medication Sig  . aspirin EC 81 MG tablet Take 1 tablet (81 mg total) by mouth daily.  . canagliflozin (INVOKANA) 300 MG TABS tablet Take 300 mg by mouth daily at 6 (six) AM. Take 1/2 tab  . Cholecalciferol (VITAMIN D) 2000 UNITS tablet Take 2,000 Units by mouth daily.  . clonazePAM (KLONOPIN) 1 MG tablet TAKE 1 TABLET BY MOUTH 4 TIMES A DAY  . diazepam (VALIUM) 5 MG tablet Take 1/2 to 1 tablet 3 x day as needed for Vertigo  . fluconazole (DIFLUCAN) 150 MG tablet Take 1 tablet (150 mg total) by mouth daily. (Patient not taking: Reported on 06/20/2014)  . FLUoxetine (PROZAC) 40 MG capsule TAKE 1 CAPSULE (40 MG TOTAL) BY MOUTH DAILY.  . furosemide  (LASIX) 40 MG tablet Take 0.5 tablets (20 mg total) by mouth daily.  Marland Kitchen gemfibrozil (LOPID) 600 MG tablet TAKE 1 TABLET (600 MG TOTAL) BY MOUTH 2 (TWO) TIMES DAILY BEFORE A MEAL.  Marland Kitchen glimepiride (AMARYL) 4 MG tablet TAKE 1 TABLET (4 MG TOTAL) BY MOUTH 2 (TWO) TIMES DAILY.  Marland Kitchen HYDROcodone-acetaminophen (NORCO) 5-325 MG per tablet Take 1 tablet by mouth every 6 (six) hours as needed for moderate pain (for cough).  . hydrOXYzine (ATARAX/VISTARIL) 25 MG tablet Take 1 tablet (25 mg total) by mouth 3 (three) times daily as needed for itching.  . lidocaine (LIDODERM) 5 % Place 1 patch onto the skin every 12 (twelve) hours. Remove & Discard patch within 12 hours or as directed by MD  . losartan (COZAAR) 50 MG tablet Take 1 tablet (50 mg total) by mouth daily.  . meloxicam (MOBIC) 15 MG tablet Take 1 tablet (15 mg total) by mouth daily.  . metFORMIN (GLUCOPHAGE) 1000 MG tablet Take 1 tablet (1,000 mg total) by mouth 2 (two) times daily with a meal.  . OVER THE COUNTER MEDICATION [facility] acid reduced 75 mg at bedtime  . Red Yeast Rice 600 MG CAPS Take by mouth 2 (two) times daily.  Marland Kitchen rOPINIRole (REQUIP) 1 MG tablet TAKE 1 TABLET 4 TIMES A DAY FOR RESTLESS LEGS   Allergies  Allergen Reactions  . Aspirin   . Crestor [Rosuvastatin]   . Lipitor [Atorvastatin]   . Lyrica [Pregabalin] Other (See Comments)    Blurred  vision  . Minocycline Hcl     REACTION: Reaction not known  . Oruvail [Ketoprofen]   . Tricor [Fenofibrate]   . Vasotec [Enalaprilat]   . Victoza [Liraglutide]   . Zinc    PMHx:   Past Medical History  Diagnosis Date  . Diabetes mellitus without complication   . Hypertension   . Hyperlipidemia   . OSA (obstructive sleep apnea)   . DJD (degenerative joint disease)   . RLS (restless legs syndrome)   . Obesity   . Vitamin D deficiency    Immunization History  Administered Date(s) Administered  . Hepatitis B 07/09/1999, 08/09/1999, 01/09/2000  . Influenza, High Dose Seasonal PF 03/20/2014   . Pneumococcal Conjugate-13 03/20/2014  . Pneumococcal Polysaccharide-23 10/15/2009  . Td 03/19/2004   Past Surgical History  Procedure Laterality Date  . Joint replacement    . Cataract extraction     FHx:    Reviewed / unchanged  SHx:    Reviewed / unchanged  Systems Review:  Constitutional: Denies fever, chills, wt changes, headaches, insomnia, fatigue, night sweats, change in appetite. Eyes: Denies redness, blurred vision, diplopia, discharge, itchy, watery eyes.  ENT: Denies discharge, congestion, post nasal drip, epistaxis, sore throat, earache, hearing loss, dental pain, tinnitus, vertigo, sinus pain, snoring.  CV: Denies chest pain, palpitations, irregular heartbeat, syncope, dyspnea, diaphoresis, orthopnea, PND, claudication or edema. Respiratory: denies cough, dyspnea, DOE, pleurisy, hoarseness, laryngitis, wheezing.  Gastrointestinal: Denies dysphagia, odynophagia, heartburn, reflux, water brash, abdominal pain or cramps, nausea, vomiting, bloating, diarrhea, constipation, hematemesis, melena, hematochezia  or hemorrhoids. Genitourinary: Denies dysuria, frequency, urgency, nocturia, hesitancy, discharge, hematuria or flank pain. Musculoskeletal: Denies arthralgias, myalgias, stiffness, jt. swelling, pain, limping or strain/sprain.  Skin: Denies pruritus, rash, hives, warts, acne, eczema or change in skin lesion(s). Neuro: No weakness, tremor, incoordination, spasms, paresthesia or pain. Psychiatric: Denies confusion, memory loss or sensory loss. Endo: Denies change in weight, skin or hair change.  Heme/Lymph: No excessive bleeding, bruising or enlarged lymph nodes.  Physical Exam  BP 130/68  Pulse 84  Temp 97 F   Resp 18  Ht 5' 3.25"  Wt 210 lb 9.6 oz    BMI 37  Appears well nourished and in no distress. Hoarse raspy voice - Dry hacking cough.   Eyes: PERRLA, EOMs, conjunctiva no swelling or erythema. Sinuses: No frontal/maxillary tenderness ENT/Mouth: EAC's  clear, TM's nl w/o erythema, bulging. Nares clear w/o erythema, swelling, exudates. Oropharynx clear without erythema or exudates. Oral hygiene is good. Tongue normal, non obstructing. Hearing intact.  Neck: Supple. Thyroid nl. Car 2+/2+ without bruits, nodes or JVD. Chest: Respirations nl with BS clear & equal w/few scattered rales,but no rhonchi, wheezing or stridor.  Cor: Heart sounds normal w/ regular rate and rhythm without sig. murmurs, gallops, clicks, or rubs. Peripheral pulses normal and equal  without edema.  Abdomen: Soft & bowel sounds normal. Non-tender w/o guarding, rebound, hernias, masses, or organomegaly.  Lymphatics: Unremarkable.  Musculoskeletal: Full ROM all peripheral extremities, joint stability, 5/5 strength, and normal gait.  Skin: Warm, dry without exposed rashes, lesions or ecchymosis apparent.  Neuro: Cranial nerves intact, reflexes equal bilaterally. Sensory-motor testing grossly intact. Tendon reflexes grossly intact.  Pysch: Alert & oriented x 3.  Insight and judgement nl & appropriate. No ideations.  Assessment and Plan:  1. Essential hypertension  - TSH  2. Hyperlipidemia  - Lipid panel  3. 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 Vitamin D deficiency  -  Vit D  25 hydroxy   5. Morbid obesity (BMI 39)   6. Laryngitis  - predniSONE (DELTASONE) 20 MG tablet; 1 tab 3 x day for 2 days, then 1 tab 2 x day for 2 days, then 1 tab 1 x day for 3 days  Dispense: 13 tablet; Refill: 0 - azithromycin (ZITHROMAX) 250 MG tablet; Take 2 tab on  Day ,  followed by 1 tablet once daily on Days 2 - 5.  Dispense: 6 each; Rf: 1 - traMADol (ULTRAM) 50 MG tablet; Take 1 tablet 4 x day if needed for cough or pain  Dispense: 60 tablet; Refill: 0  7. 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 Medication management  - CBC with Differential/Platelet - BASIC METABOLIC PANEL WITH GFR - Hepatic function panel -  Magnesium     Recommended regular exercise, BP monitoring, weight control, and discussed med and SE's. Recommended labs to assess and monitor clinical status. Further disposition pending results of labs. Over 30 minutes of exam, counseling, chart review was performed

## 2014-08-13 NOTE — Patient Instructions (Signed)

## 2014-08-14 LAB — VITAMIN D 25 HYDROXY (VIT D DEFICIENCY, FRACTURES): VIT D 25 HYDROXY: 27 ng/mL — AB (ref 30–100)

## 2014-08-14 LAB — INSULIN, RANDOM: INSULIN: 17.3 u[IU]/mL (ref 2.0–19.6)

## 2014-08-24 ENCOUNTER — Encounter: Payer: Self-pay | Admitting: *Deleted

## 2014-09-02 ENCOUNTER — Other Ambulatory Visit: Payer: Self-pay | Admitting: Internal Medicine

## 2014-09-30 ENCOUNTER — Ambulatory Visit: Payer: Self-pay | Admitting: Internal Medicine

## 2014-10-03 ENCOUNTER — Other Ambulatory Visit: Payer: Self-pay | Admitting: Internal Medicine

## 2014-10-22 ENCOUNTER — Other Ambulatory Visit: Payer: Self-pay | Admitting: Internal Medicine

## 2014-11-18 ENCOUNTER — Ambulatory Visit (INDEPENDENT_AMBULATORY_CARE_PROVIDER_SITE_OTHER): Payer: PPO | Admitting: Internal Medicine

## 2014-11-18 ENCOUNTER — Encounter: Payer: Self-pay | Admitting: Internal Medicine

## 2014-11-18 ENCOUNTER — Ambulatory Visit: Payer: Self-pay | Admitting: Internal Medicine

## 2014-11-18 VITALS — BP 120/70 | HR 80 | Temp 98.0°F | Resp 18 | Ht 63.75 in | Wt 214.0 lb

## 2014-11-18 DIAGNOSIS — Z79899 Other long term (current) drug therapy: Secondary | ICD-10-CM

## 2014-11-18 DIAGNOSIS — E1140 Type 2 diabetes mellitus with diabetic neuropathy, unspecified: Secondary | ICD-10-CM

## 2014-11-18 DIAGNOSIS — E559 Vitamin D deficiency, unspecified: Secondary | ICD-10-CM

## 2014-11-18 DIAGNOSIS — M159 Polyosteoarthritis, unspecified: Secondary | ICD-10-CM

## 2014-11-18 DIAGNOSIS — E1129 Type 2 diabetes mellitus with other diabetic kidney complication: Secondary | ICD-10-CM

## 2014-11-18 DIAGNOSIS — F32A Depression, unspecified: Secondary | ICD-10-CM

## 2014-11-18 DIAGNOSIS — E782 Mixed hyperlipidemia: Secondary | ICD-10-CM

## 2014-11-18 DIAGNOSIS — M150 Primary generalized (osteo)arthritis: Secondary | ICD-10-CM

## 2014-11-18 DIAGNOSIS — I10 Essential (primary) hypertension: Secondary | ICD-10-CM

## 2014-11-18 DIAGNOSIS — F329 Major depressive disorder, single episode, unspecified: Secondary | ICD-10-CM

## 2014-11-18 MED ORDER — TRAMADOL HCL 50 MG PO TABS: ORAL_TABLET | ORAL | Status: DC

## 2014-11-18 MED ORDER — BUPROPION HCL ER (XL) 150 MG PO TB24: 150.0000 mg | ORAL_TABLET | ORAL | Status: DC

## 2014-11-18 NOTE — Progress Notes (Signed)
Patient ID: Janet Choi, female   DOB: 02/10/1941, 74 y.o.   MRN: [id number]  Assessment and Plan:   1. Essential hypertension -cont meds -monitor at home -call office if blood pressure consistently elevated over 160/90 - TSH  2. 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 Hyperlipidemia -cont diet and exercise - Lipid panel  4. Vitamin D deficiency -cont supplement - Vit D  25 hydroxy (rtn osteoporosis monitoring)  5. Medication management  - CBC with Differential/Platelet - BASIC METABOLIC PANEL WITH GFR - Hepatic function panel - Magnesium  6. Morbid obesity (BMI 39) -diet and exercise  7. Joint pain  - traMADol (ULTRAM) 50 MG tablet; Take 1 tablet 4 x day if needed for cough or pain  Dispense: 60 tablet; Refill: 0  8. Depression -cont prozac -add wellbutrin -see back in 1 month     Continue diet and meds as discussed. Further disposition pending results of labs. Discussed med's effects and SE's.    HPI 74 y.o. female  presents for 3 month follow up with hypertension, hyperlipidemia, diabetes and vitamin D deficiency.   Her blood pressure has been controlled at home, today [name] is BP: 120/70 mmHg.She does not workout. She denies chest pain, shortness of breath, dizziness.   She is not on cholesterol medication and denies myalgias. Her cholesterol is not at goal. The cholesterol was:  08/13/2014: Cholesterol 290*; HDL 35*; LDL Cholesterol NOT CALC; Triglycerides 519*   She has not been working on diet and exercise for diabetes with diabetic chronic kidney disease, she is on bASA, she is on ACE/ARB, and denies  foot ulcerations, hyperglycemia, hypoglycemia , increased appetite, nausea, paresthesia of the feet, polydipsia, polyuria, visual disturbances, vomiting and weight loss. Last A1C was: 08/13/2014: Hgb A1c MFr Bld 11.7*  Blood sugar has been running consistently over 200.      Patient is on Vitamin D supplement. 08/13/2014: Vit D, 25-Hydroxy 27*  She reports that since her rotator cuff surgery she has been having a really hard time with fatigue and restless legs.  She reports that she has been having to take a lot of tylenol.  She reports that she has been having a lot of issues with her legs and she feels like it has been a lot worse lately.    She reports that her depression has been really bad lately and she feels like it is relating her to binge eating at night time.    Current Medications:  Current Outpatient Prescriptions on File Prior to Visit  Medication Sig Dispense Refill  . aspirin EC 81 MG tablet Take 1 tablet (81 mg total) by mouth daily. 150 tablet 2  . Cholecalciferol (VITAMIN D) 2000 UNITS tablet Take 10,000 Units by mouth daily.     . clonazePAM (KLONOPIN) 1 MG tablet TAKE 1 TABLET BY MOUTH 4 TIMES A DAY 90 tablet 1  . FLUoxetine (PROZAC) 40 MG capsule TAKE 1 CAPSULE (40 MG TOTAL) BY MOUTH DAILY. 90 capsule 1  . furosemide (LASIX) 40 MG tablet Take 0.5 tablets (20 mg total) by mouth daily. 90 tablet 2  . glimepiride (AMARYL) 4 MG tablet TAKE 1 TABLET (4 MG TOTAL) BY MOUTH 2 (TWO) TIMES DAILY. 180 tablet 99  . lidocaine (LIDODERM) 5 % Place 1 patch onto the skin every 12 (twelve) hours. Remove & Discard patch within 12 hours or as directed by MD 30 patch  3  . losartan (COZAAR) 50 MG tablet TAKE 1 TABLET BY MOUTH DAILY FOR BLOOD PRESSURE AND KIDNEY 90 tablet 2  . meloxicam (MOBIC) 15 MG tablet TAKE 1 TABLET EVERY DAY FOR PAIN AND INFLAMATION 90 tablet 1  . metFORMIN (GLUCOPHAGE-XR) 500 MG 24 hr tablet TAKE 2 TABLETS TWICE A DAY 180 tablet 1  . OVER THE COUNTER MEDICATION 150 mg at bedtime. CVS acid reduced 75 mg at bedtime    . rOPINIRole (REQUIP) 1 MG tablet TAKE 1 TABLET 4 TIMES A DAY FOR RESTLESS LEGS 120 tablet 5   No current facility-administered medications on file prior to visit.   Medical History:  Past Medical History  Diagnosis Date  .  Diabetes mellitus without complication   . Hypertension   . Hyperlipidemia   . OSA (obstructive sleep apnea)   . DJD (degenerative joint disease)   . RLS (restless legs syndrome)   . Obesity   . Vitamin D deficiency    Allergies:  Allergies  Allergen Reactions  . Aspirin   . Crestor [Rosuvastatin]   . Lipitor [Atorvastatin]   . Lyrica [Pregabalin] Other (See Comments)    Blurred vision  . Minocycline Hcl     REACTION: Reaction not known  . Oruvail [Ketoprofen]   . Tricor [Fenofibrate]   . Vasotec [Enalaprilat]   . Victoza [Liraglutide]   . Zinc      Review of Systems:  Review of Systems  Constitutional: Negative for fever, chills and malaise/fatigue.  HENT: Negative for congestion, ear pain and sore throat.   Eyes: Negative.   Respiratory: Negative for cough, shortness of breath and wheezing.   Cardiovascular: Negative for chest pain, palpitations and leg swelling.  Gastrointestinal: Negative for heartburn, nausea, vomiting, diarrhea, constipation, blood in stool and melena.  Genitourinary: Negative.   Musculoskeletal: Positive for joint pain.  Skin: Negative.   Neurological: Negative for dizziness, sensory change, loss of consciousness and headaches.  Psychiatric/Behavioral: Positive for depression. The patient is nervous/anxious and has insomnia.     Family history- Review and unchanged  Social history- Review and unchanged  Physical Exam: BP 120/70 mmHg  Pulse 80  Temp(Src) 98 F (36.7 C) (Temporal)  Resp 18  Ht 5' 3.75" (1.619 m)  Wt 214 lb (97.07 kg)  BMI 37.03 kg/m2 Wt Readings from Last 3 Encounters:  11/18/14 214 lb (97.07 kg)  08/13/14 210 lb 9.6 oz (95.528 kg)  06/20/14 206 lb (93.441 kg)   General Appearance: Well nourished well developed, non-toxic appearing, in no apparent distress. Eyes: PERRLA, EOMs, conjunctiva no swelling or erythema ENT/Mouth: Ear canals clear with no erythema, swelling, or discharge.  TMs normal bilaterally, oropharynx  clear, moist, with no exudate.   Neck: Supple, thyroid normal, no JVD, no cervical adenopathy.  Respiratory: Respiratory effort normal, breath sounds clear A&P, no wheeze, rhonchi or rales noted.  No retractions, no accessory muscle usage Cardio: RRR with no MRGs. No noted edema.  Abdomen: Morbidly obese, Soft, + BS.  Non tender, no guarding, rebound, hernias, masses. Musculoskeletal: Limited ROM in bilateral shoulders, 5/5 strength, Normal gait.  Pain patch present on left shoulder.  Nodules located to the right palm of the hand.  Unable to straighten ring finger and pinky finger of left hand Skin: Dark brown irregular nevi on the back of the left side of the inferior neck.  Warm, dry without rashes, lesions, ecchymosis.  Neuro: Awake and oriented X 3, Cranial nerves intact. No cerebellar symptoms.  Psych: Depressed affect, Insight and  Judgment appropriate.    [name], PA-C 3:18 PM [facility]

## 2014-11-18 NOTE — Patient Instructions (Addendum)
Dupuytren's Contracture Dupuytren's contracture affects the fingers and the palm of the hand. This condition usually develops slowly. It may take many years to develop. The pinky finger and the ring finger are most often affected. These fingers start to curve inward, like a claw. At some point, the fingers cannot go straight anymore. This can make it hard to do things like:  Put on gloves.  Shake hands.  Grab something off a shelf. The condition usually does not cause pain and is not dangerous. The condition gets its name from the doctor who came up with an operation to fix the problem. His name was Baron Guillaume Dupuytren. Contracture means pulling inward. CAUSES  Dupuytren's contracture does not start with the fingers. It starts in the palm of the hand, under the skin. The tissue under the skin is called fascia. The fascia covers the cords (tendons) that control how the fingers move. In Dupuytren's contracture the fascia tissue becomes thick and then pulls on the cords. That causes the fingers to curl. The condition can affect both hands and any fingers, but it usually strikes one hand worse than the other. The fingers farthest from the thumb are most often the ones that curl. The cause is not clear. Some experts believe it results from an autoimmune reaction. That means the body's immune system (which fights off disease) attacks itself by mistake. What experts do know is that certain conditions and behaviors (called risk factors) make the chance of having this condition more likely. They include:  Age. Most people who have the condition are older than 50.  Sex. It affects men more often than women.  Family history. The condition tends to run in families from countries in [place].  Certain behaviors. People who smoke and drink alcohol are more apt to develop the problem.  Some other medical conditions. Having diabetes makes Dupuytren's contracture more likely. So does  having a condition that involves a seizure (when the brain's function is interrupted). SYMPTOMS  Signs of this condition take time to develop. Sometimes this takes weeks or months. More often, it takes several years.   Early symptoms:  Skin on the palm of the hand becomes thick. This is usually the first sign.  The skin may look dimpled or puckered.  Lumps (nodules) show up on the palm. There may be one or more lumps. They are not painful.  Later symptoms:  Thick cords of tissue form in the palm of the hand.  The pinky and ring fingers start to curl up into the palm.  The fingers cannot be straightened into their normal position. DIAGNOSIS  A physical examination is the main way that a healthcare provider can tell if you have Dupuytren's contracture. Other tests usually are not needed. The caregiver will probably:  Look at your hands. Feel your hands. This is to check for thickening and nodules.  Measure finger motion. This tells how much your fingers have contracted (pulled in).  Do a tabletop test. You will be asked to try to put your hand flat on a table, palm down. TREATMENT  There is no cure for Dupuytren's contracture. But there are ways to treat the symptoms. Options include:  Watching and waiting. The condition develops slowly. Often it does not create problems for a long time. Sometimes the skin gets thick and nodules form, but the fingers never curl. So, in some cases it is best to just watch the condition carefully and wait to see what happens.    Shots (injections). Different substances may be injected, including:  Steroids. These drugs block swelling. These shots should make the condition less uncomfortable. Steroids may also slow down the condition. Shots are given into the nodules. The effect only lasts awhile. More shots may have to be given.  Enzymes. These are proteins. They weaken the thick tissue. After an injection, the caregiver usually stretches the  fingers.  Needling. A needle is pushed through the skin and into the thick tissue. This is done in several spots. The goal is to break up the thickened tissue. Or to weaken it.  Surgery. This may be suggested if you cannot grasp objects. Or, if you can no longer put your hand in your pocket.  A cut (incision) is made in the palm of the hand. The thick tissue is removed.  Sometimes the thick tissue is attached to the skin. Then, the skin must be removed, too. It is replaced with a piece of skin from another place on your body. That is called a skin graft.  Occupational or hand therapy is almost always needed after surgery. This involves special exercises to get back the use of your hand and fingers. After a skin graft, several months of therapy may be needed.  Sometimes the condition comes back, even after surgery.  Other methods. You can do some things on your own. They include:  Stretching the fingers backwards. Do this often.  Warming the hand and massaging it. Again, do this often.  Using tools with padded grips. This should make things easier.  Wearing heavy gloves while working. This protects the hands. PROGNOSIS  Dupuytren's contracture usually develops slowly. There is no cure. But, the symptoms can be treated. Sometimes they come back after treatment, but not always. It is important to remember that this is a functional problem and not a life-threatening condition. Document Released: 02/21/2009 Document Revised: 07/19/2011 Document Reviewed: 02/21/2009 [facility] Patient Information 2015 [place], [place]. This information is not intended to replace advice given to you by your health care provider. Make sure you discuss any questions you have with your health care provider.  Bupropion extended-release tablets (Depression/Mood Disorders) What is this medicine? BUPROPION (byoo PROE pee on) is used to treat depression. This medicine may be used for other purposes; ask your health care  provider or pharmacist if you have questions. COMMON BRAND NAME(S): Aplenzin, Budeprion XL, Forfivo XL, Wellbutrin XL What should I tell my health care provider before I take this medicine? They need to know if you have any of these conditions: -an eating disorder, such as anorexia or bulimia -bipolar disorder or psychosis -diabetes or high blood sugar, treated with medication -glaucoma -head injury or brain tumor -heart disease, previous heart attack, or irregular heart beat -high blood pressure -kidney or liver disease -seizures (convulsions) -suicidal thoughts or a previous suicide attempt -Tourette's syndrome -weight loss -an unusual or allergic reaction to bupropion, other medicines, foods, dyes, or preservatives -breast-feeding -pregnant or trying to become pregnant How should I use this medicine? Take this medicine by mouth with a glass of water. Follow the directions on the prescription label. You can take it with or without food. If it upsets your stomach, take it with food. Do not crush, chew, or cut these tablets. This medicine is taken once daily at the same time each day. Do not take your medicine more often than directed. Do not stop taking this medicine suddenly except upon the advice of your doctor. Stopping this medicine too quickly may cause  serious side effects or your condition may worsen. A special MedGuide will be given to you by the pharmacist with each prescription and refill. Be sure to read this information carefully each time. Talk to your pediatrician regarding the use of this medicine in children. Special care may be needed. Overdosage: If you think you have taken too much of this medicine contact a poison control center or emergency room at once. NOTE: This medicine is only for you. Do not share this medicine with others. What if I miss a dose? If you miss a dose, skip the missed dose and take your next tablet at the regular time. Do not take double or extra  doses. What may interact with this medicine? Do not take this medicine with any of the following medications: -linezolid -MAOIs like Azilect, Carbex, Eldepryl, Marplan, Nardil, and Parnate -methylene blue (injected into a vein) -other medicines that contain bupropion like Zyban This medicine may also interact with the following medications: -alcohol -certain medicines for anxiety or sleep -certain medicines for blood pressure like metoprolol, propranolol -certain medicines for depression or psychotic disturbances -certain medicines for HIV or AIDS like efavirenz, lopinavir, nelfinavir, ritonavir -certain medicines for irregular heart beat like propafenone, flecainide -certain medicines for Parkinson's disease like amantadine, levodopa -certain medicines for seizures like carbamazepine, phenytoin, phenobarbital -cimetidine -clopidogrel -cyclophosphamide -furazolidone -isoniazid -nicotine -orphenadrine -procarbazine -steroid medicines like prednisone or cortisone -stimulant medicines for attention disorders, weight loss, or to stay awake -tamoxifen -theophylline -thiotepa -ticlopidine -tramadol -warfarin This list may not describe all possible interactions. Give your health care provider a list of all the medicines, herbs, non-prescription drugs, or dietary supplements you use. Also tell them if you smoke, drink alcohol, or use illegal drugs. Some items may interact with your medicine. What should I watch for while using this medicine? Tell your doctor if your symptoms do not get better or if they get worse. Visit your doctor or health care professional for regular checks on your progress. Because it may take several weeks to see the full effects of this medicine, it is important to continue your treatment as prescribed by your doctor. Patients and their families should watch out for new or worsening thoughts of suicide or depression. Also watch out for sudden changes in feelings  such as feeling anxious, agitated, panicky, irritable, hostile, aggressive, impulsive, severely restless, overly excited and hyperactive, or not being able to sleep. If this happens, especially at the beginning of treatment or after a change in dose, call your health care professional. Avoid alcoholic drinks while taking this medicine. Drinking large amounts of alcoholic beverages, using sleeping or anxiety medicines, or quickly stopping the use of these agents while taking this medicine may increase your risk for a seizure. Do not drive or use heavy machinery until you know how this medicine affects you. This medicine can impair your ability to perform these tasks. Do not take this medicine close to bedtime. It may prevent you from sleeping. Your mouth may get dry. Chewing sugarless gum or sucking hard candy, and drinking plenty of water may help. Contact your doctor if the problem does not go away or is severe. The tablet shell for some brands of this medicine does not dissolve. This is normal. The tablet shell may appear whole in the stool. This is not a cause for concern. What side effects may I notice from receiving this medicine? Side effects that you should report to your doctor or health care professional as soon as possible: -allergic reactions  like skin rash, itching or hives, swelling of the face, lips, or tongue -breathing problems -changes in vision -confusion -fast or irregular heartbeat -hallucinations -increased blood pressure -redness, blistering, peeling or loosening of the skin, including inside the mouth -seizures -suicidal thoughts or other mood changes -unusually weak or tired -vomiting Side effects that usually do not require medical attention (report to your doctor or health care professional if they continue or are bothersome): -change in sex drive or performance -constipation -headache -loss of appetite -nausea -tremors -weight loss This list may not describe all  possible side effects. Call your doctor for medical advice about side effects. You may report side effects to FDA at 1-800-FDA-1088. Where should I keep my medicine? Keep out of the reach of children. Store at room temperature between 15 and 30 degrees C (59 and 86 degrees F). Throw away any unused medicine after the expiration date. NOTE: This sheet is a summary. It may not cover all possible information. If you have questions about this medicine, talk to your doctor, pharmacist, or health care provider.  2015, [employer]. (2012-11-17 12:39:42)

## 2014-11-19 LAB — CBC WITH DIFFERENTIAL/PLATELET
Basophils Absolute: 0 10*3/uL (ref 0.0–0.1)
Basophils Relative: 0 % (ref 0–1)
Eosinophils Absolute: 0.3 10*3/uL (ref 0.0–0.7)
Eosinophils Relative: 5 % (ref 0–5)
HCT: 36.7 % (ref 36.0–46.0)
Hemoglobin: 11.6 g/dL — ABNORMAL LOW (ref 12.0–15.0)
Lymphocytes Relative: 25 % (ref 12–46)
Lymphs Abs: 1.3 10*3/uL (ref 0.7–4.0)
MCH: 27.2 pg (ref 26.0–34.0)
MCHC: 31.6 g/dL (ref 30.0–36.0)
MCV: 86.2 fL (ref 78.0–100.0)
MONOS PCT: 5 % (ref 3–12)
MPV: 9.9 fL (ref 8.6–12.4)
Monocytes Absolute: 0.3 10*3/uL (ref 0.1–1.0)
NEUTROS ABS: 3.4 10*3/uL (ref 1.7–7.7)
Neutrophils Relative %: 65 % (ref 43–77)
Platelets: 247 10*3/uL (ref 150–400)
RBC: 4.26 MIL/uL (ref 3.87–5.11)
RDW: 14.2 % (ref 11.5–15.5)
WBC: 5.3 10*3/uL (ref 4.0–10.5)

## 2014-11-19 LAB — LIPID PANEL
CHOL/HDL RATIO: 8.9 ratio
Cholesterol: 310 mg/dL — ABNORMAL HIGH (ref 0–200)
HDL: 35 mg/dL — ABNORMAL LOW (ref >=46)
Triglycerides: 713 mg/dL — ABNORMAL HIGH (ref <150)

## 2014-11-19 LAB — BASIC METABOLIC PANEL WITH GFR
BUN: 22 mg/dL (ref 6–23)
CHLORIDE: 97 meq/L (ref 96–112)
CO2: 24 mEq/L (ref 19–32)
CREATININE: 1.09 mg/dL (ref 0.50–1.10)
Calcium: 9.5 mg/dL (ref 8.4–10.5)
GFR, EST AFRICAN AMERICAN: 58 mL/min — AB
GFR, Est Non African American: 50 mL/min — ABNORMAL LOW
Glucose, Bld: 362 mg/dL — ABNORMAL HIGH (ref 70–99)
POTASSIUM: 4.6 meq/L (ref 3.5–5.3)
Sodium: 136 mEq/L (ref 135–145)

## 2014-11-19 LAB — VITAMIN D 25 HYDROXY (VIT D DEFICIENCY, FRACTURES): Vit D, 25-Hydroxy: 43 ng/mL (ref 30–100)

## 2014-11-19 LAB — INSULIN, RANDOM: Insulin: 15.2 u[IU]/mL (ref 2.0–19.6)

## 2014-11-19 LAB — HEMOGLOBIN A1C
Hgb A1c MFr Bld: 11.7 % — ABNORMAL HIGH (ref <5.7)
MEAN PLASMA GLUCOSE: 289 mg/dL — AB (ref <117)

## 2014-11-19 LAB — HEPATIC FUNCTION PANEL
ALT: 11 U/L (ref 0–35)
AST: 11 U/L (ref 0–37)
Albumin: 4 g/dL (ref 3.5–5.2)
Alkaline Phosphatase: 82 U/L (ref 39–117)
BILIRUBIN DIRECT: 0.1 mg/dL (ref 0.0–0.3)
BILIRUBIN TOTAL: 0.4 mg/dL (ref 0.2–1.2)
Indirect Bilirubin: 0.3 mg/dL (ref 0.2–1.2)
Total Protein: 6.3 g/dL (ref 6.0–8.3)

## 2014-11-19 LAB — TSH: TSH: 2.246 u[IU]/mL (ref 0.350–4.500)

## 2014-11-19 LAB — MAGNESIUM: Magnesium: 2 mg/dL (ref 1.5–2.5)

## 2014-11-21 ENCOUNTER — Other Ambulatory Visit: Payer: Self-pay | Admitting: Internal Medicine

## 2014-11-21 MED ORDER — PRAVASTATIN SODIUM 40 MG PO TABS: 40.0000 mg | ORAL_TABLET | Freq: Every evening | ORAL | Status: DC

## 2014-11-29 ENCOUNTER — Ambulatory Visit: Payer: Self-pay | Admitting: Internal Medicine

## 2014-12-09 ENCOUNTER — Encounter: Payer: Self-pay | Admitting: Internal Medicine

## 2014-12-20 ENCOUNTER — Encounter: Payer: Self-pay | Admitting: Internal Medicine

## 2014-12-20 ENCOUNTER — Ambulatory Visit (INDEPENDENT_AMBULATORY_CARE_PROVIDER_SITE_OTHER): Payer: PPO | Admitting: Internal Medicine

## 2014-12-20 VITALS — BP 128/70 | HR 80 | Temp 98.0°F | Resp 16 | Ht 63.75 in | Wt 214.0 lb

## 2014-12-20 DIAGNOSIS — N058 Unspecified nephritic syndrome with other morphologic changes: Secondary | ICD-10-CM

## 2014-12-20 DIAGNOSIS — E1129 Type 2 diabetes mellitus with other diabetic kidney complication: Secondary | ICD-10-CM | POA: Diagnosis not present

## 2014-12-20 DIAGNOSIS — F329 Major depressive disorder, single episode, unspecified: Secondary | ICD-10-CM | POA: Diagnosis not present

## 2014-12-20 DIAGNOSIS — F32A Depression, unspecified: Secondary | ICD-10-CM

## 2014-12-20 NOTE — Progress Notes (Signed)
Patient ID: Janet Choi, female   DOB: 15-Nov-1940, 74 y.o.   MRN: [id number]  Assessment and Plan:   1. 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 Depression -increase wellbutrin to 300 mg -cont prozac -counselor suggested but turned down   HPI 74 y.o.female presents for 1 month follow up of depression and also starting wellbutrin. Patient reports that they have not noticed a difference since starting the medication.  female is taking their medication.  They are having difficulty with their medications.  They report no adverse reactions.  She reports that her stress is partially due to her son and his issues that she has to deal with. She reports that she owns part of a beach house and she is going to go for a week soon.    Patient reports that her son is the main cause of her stress and she feels like her stress and financial issues will not go away until she stops enabling him.  She has gone to counseling many times but she does not currently want to go.    She reports that she has finally gotten fasting sugars below 200.  She has been working very hard on her diet.  She still wants to go back on invokana, but is aware that the medication is contraindicated due to her kidney functions.    Past Medical History  Diagnosis Date  . Diabetes mellitus without complication   . Hypertension   . Hyperlipidemia   . OSA (obstructive sleep apnea)   . DJD (degenerative joint disease)   . RLS (restless legs syndrome)   . Obesity   . Vitamin D deficiency      Allergies  Allergen Reactions  . Aspirin   . Crestor [Rosuvastatin]   . Lipitor [Atorvastatin]   . Lyrica [Pregabalin] Other (See Comments)    Blurred vision  . Minocycline Hcl     REACTION: Reaction not known  . Oruvail [Ketoprofen]   . Tricor [Fenofibrate]   . Vasotec [Enalaprilat]   . Victoza [Liraglutide]   . Zinc       Current Outpatient Prescriptions on File Prior to Visit  Medication Sig  Dispense Refill  . buPROPion (WELLBUTRIN XL) 150 MG 24 hr tablet Take 1 tablet (150 mg total) by mouth every morning. 30 tablet 2  . Cholecalciferol (VITAMIN D) 2000 UNITS tablet Take 10,000 Units by mouth daily.     . clonazePAM (KLONOPIN) 1 MG tablet TAKE 1 TABLET BY MOUTH 4 TIMES A DAY 90 tablet 1  . FLUoxetine (PROZAC) 40 MG capsule TAKE 1 CAPSULE (40 MG TOTAL) BY MOUTH DAILY. 90 capsule 1  . furosemide (LASIX) 40 MG tablet Take 0.5 tablets (20 mg total) by mouth daily. 90 tablet 2  . glimepiride (AMARYL) 4 MG tablet TAKE 1 TABLET (4 MG TOTAL) BY MOUTH 2 (TWO) TIMES DAILY. 180 tablet 99  . lidocaine (LIDODERM) 5 % Place 1 patch onto the skin every 12 (twelve) hours. Remove & Discard patch within 12 hours or as directed by MD 30 patch 3  . losartan (COZAAR) 50 MG tablet TAKE 1 TABLET BY MOUTH DAILY FOR BLOOD PRESSURE AND KIDNEY 90 tablet 2  . Magnesium 400 MG CAPS Take by mouth daily.    . meloxicam (MOBIC) 15 MG tablet TAKE 1 TABLET EVERY DAY FOR PAIN AND INFLAMATION 90 tablet 1  . metFORMIN (GLUCOPHAGE-XR) 500 MG 24 hr tablet TAKE 2 TABLETS TWICE A DAY 180 tablet 1  .  OVER THE COUNTER MEDICATION 150 mg at bedtime. CVS acid reduced 75 mg at bedtime    . rOPINIRole (REQUIP) 1 MG tablet TAKE 1 TABLET 4 TIMES A DAY FOR RESTLESS LEGS 120 tablet 5  . traMADol (ULTRAM) 50 MG tablet Take 1 tablet 4 x day if needed for cough or pain 60 tablet 0   No current facility-administered medications on file prior to visit.    ROS: all negative except above.   Physical Exam: Filed Weights   12/20/14 1041  Weight: 214 lb (97.07 kg)   Wt Readings from Last 3 Encounters:  12/20/14 214 lb (97.07 kg)  11/18/14 214 lb (97.07 kg)  08/13/14 210 lb 9.6 oz (95.528 kg)    BP 128/70 mmHg  Pulse 80  Temp(Src) 98 F (36.7 C) (Temporal)  Resp 16  Ht 5' 3.75" (1.619 m)  Wt 214 lb (97.07 kg)  BMI 37.03 kg/m2 General Appearance: Well developed well nourished, non-toxic appearing in no apparent  distress. Eyes: PERRLA, EOMs, conjunctiva w/ no swelling or erythema or discharge Sinuses: No Frontal/maxillary tenderness ENT/Mouth: Ear canals clear without swelling or erythema.  TM's normal bilaterally with no retractions, bulging, or loss of landmarks.   Neck: Supple, thyroid normal, no notable JVD  Respiratory: Respiratory effort normal, Clear breath sounds anteriorly and posteriorly bilaterally without rales, rhonchi, wheezing or stridor. No retractions or accessory muscle usage. Cardio: RRR with no MRGs.   Abdomen: Soft, + BS.  Non tender, no guarding, rebound, hernias, masses.  Musculoskeletal: Full ROM, 5/5 strength, normal gait.  Skin: Warm, dry without rashes  Neuro: Awake and oriented X 3, Cranial nerves intact. Normal muscle tone, no cerebellar symptoms. Sensation intact.  Psych: normal affect, Insight and Judgment appropriate.     [name], PA-C 11:15 AM [facility]

## 2015-01-16 ENCOUNTER — Other Ambulatory Visit: Payer: Self-pay | Admitting: Internal Medicine

## 2015-01-16 NOTE — Telephone Encounter (Signed)
Rx called in 

## 2015-01-28 ENCOUNTER — Encounter: Payer: Self-pay | Admitting: Physician Assistant

## 2015-02-17 ENCOUNTER — Other Ambulatory Visit: Payer: Self-pay | Admitting: *Deleted

## 2015-02-17 MED ORDER — BUPROPION HCL ER (XL) 150 MG PO TB24: 150.0000 mg | ORAL_TABLET | ORAL | Status: DC

## 2015-02-19 ENCOUNTER — Encounter: Payer: Self-pay | Admitting: Physician Assistant

## 2015-02-19 ENCOUNTER — Other Ambulatory Visit: Payer: Self-pay

## 2015-02-19 MED ORDER — BUPROPION HCL ER (XL) 150 MG PO TB24: 150.0000 mg | ORAL_TABLET | ORAL | Status: DC

## 2015-03-24 ENCOUNTER — Telehealth: Payer: Self-pay | Admitting: Internal Medicine

## 2015-03-24 NOTE — Telephone Encounter (Signed)
Patient called, CPAP Machine Heated Humidifer not operating, [facility] suggests patient contact manufacturer for repair or replacement. Call number on the sticker on the machine. Left patient voicemail.  Janet Choi

## 2015-03-30 ENCOUNTER — Encounter: Payer: Self-pay | Admitting: *Deleted

## 2015-04-14 ENCOUNTER — Other Ambulatory Visit: Payer: Self-pay | Admitting: Internal Medicine

## 2015-04-14 ENCOUNTER — Encounter: Payer: Self-pay | Admitting: Internal Medicine

## 2015-04-14 ENCOUNTER — Ambulatory Visit (INDEPENDENT_AMBULATORY_CARE_PROVIDER_SITE_OTHER): Payer: PPO | Admitting: Internal Medicine

## 2015-04-14 VITALS — BP 134/72 | HR 92 | Temp 97.9°F | Resp 16 | Ht 62.5 in | Wt 213.0 lb

## 2015-04-14 DIAGNOSIS — Z1389 Encounter for screening for other disorder: Secondary | ICD-10-CM | POA: Diagnosis not present

## 2015-04-14 DIAGNOSIS — Z789 Other specified health status: Secondary | ICD-10-CM

## 2015-04-14 DIAGNOSIS — I10 Essential (primary) hypertension: Secondary | ICD-10-CM

## 2015-04-14 DIAGNOSIS — E782 Mixed hyperlipidemia: Secondary | ICD-10-CM

## 2015-04-14 DIAGNOSIS — Z0000 Encounter for general adult medical examination without abnormal findings: Secondary | ICD-10-CM

## 2015-04-14 DIAGNOSIS — Z0001 Encounter for general adult medical examination with abnormal findings: Secondary | ICD-10-CM

## 2015-04-14 DIAGNOSIS — Z1331 Encounter for screening for depression: Secondary | ICD-10-CM

## 2015-04-14 DIAGNOSIS — Z23 Encounter for immunization: Secondary | ICD-10-CM | POA: Diagnosis not present

## 2015-04-14 DIAGNOSIS — E559 Vitamin D deficiency, unspecified: Secondary | ICD-10-CM

## 2015-04-14 DIAGNOSIS — Z1212 Encounter for screening for malignant neoplasm of rectum: Secondary | ICD-10-CM

## 2015-04-14 DIAGNOSIS — Z6838 Body mass index (BMI) 38.0-38.9, adult: Secondary | ICD-10-CM

## 2015-04-14 DIAGNOSIS — R6889 Other general symptoms and signs: Secondary | ICD-10-CM

## 2015-04-14 DIAGNOSIS — E1140 Type 2 diabetes mellitus with diabetic neuropathy, unspecified: Secondary | ICD-10-CM

## 2015-04-14 DIAGNOSIS — J0410 Acute tracheitis without obstruction: Secondary | ICD-10-CM

## 2015-04-14 DIAGNOSIS — E1121 Type 2 diabetes mellitus with diabetic nephropathy: Secondary | ICD-10-CM

## 2015-04-14 DIAGNOSIS — G2581 Restless legs syndrome: Secondary | ICD-10-CM

## 2015-04-14 DIAGNOSIS — Z79899 Other long term (current) drug therapy: Secondary | ICD-10-CM

## 2015-04-14 DIAGNOSIS — Z9181 History of falling: Secondary | ICD-10-CM

## 2015-04-14 LAB — HEPATIC FUNCTION PANEL
ALBUMIN: 4.3 g/dL (ref 3.6–5.1)
ALK PHOS: 69 U/L (ref 33–130)
ALT: 9 U/L (ref 6–29)
AST: 11 U/L (ref 10–35)
BILIRUBIN TOTAL: 0.4 mg/dL (ref 0.2–1.2)
Bilirubin, Direct: 0.1 mg/dL (ref <=0.2)
Indirect Bilirubin: 0.3 mg/dL (ref 0.2–1.2)
TOTAL PROTEIN: 6.5 g/dL (ref 6.1–8.1)

## 2015-04-14 LAB — LIPID PANEL
Cholesterol: 259 mg/dL — ABNORMAL HIGH (ref 125–200)
HDL: 38 mg/dL — AB (ref >=46)
Total CHOL/HDL Ratio: 6.8 Ratio — ABNORMAL HIGH (ref <=5.0)
Triglycerides: 496 mg/dL — ABNORMAL HIGH (ref <150)

## 2015-04-14 LAB — CBC WITH DIFFERENTIAL/PLATELET
BASOS ABS: 0 10*3/uL (ref 0.0–0.1)
Basophils Relative: 1 % (ref 0–1)
Eosinophils Absolute: 0.1 10*3/uL (ref 0.0–0.7)
Eosinophils Relative: 3 % (ref 0–5)
HEMATOCRIT: 33 % — AB (ref 36.0–46.0)
HEMOGLOBIN: 9.8 g/dL — AB (ref 12.0–15.0)
LYMPHS ABS: 0.8 10*3/uL (ref 0.7–4.0)
LYMPHS PCT: 20 % (ref 12–46)
MCH: 22.9 pg — ABNORMAL LOW (ref 26.0–34.0)
MCHC: 29.7 g/dL — ABNORMAL LOW (ref 30.0–36.0)
MCV: 77.1 fL — AB (ref 78.0–100.0)
MONOS PCT: 9 % (ref 3–12)
MPV: 9.6 fL (ref 8.6–12.4)
Monocytes Absolute: 0.4 10*3/uL (ref 0.1–1.0)
NEUTROS PCT: 67 % (ref 43–77)
Neutro Abs: 2.7 10*3/uL (ref 1.7–7.7)
Platelets: 276 10*3/uL (ref 150–400)
RBC: 4.28 MIL/uL (ref 3.87–5.11)
RDW: 15.5 % (ref 11.5–15.5)
WBC: 4.1 10*3/uL (ref 4.0–10.5)

## 2015-04-14 LAB — BASIC METABOLIC PANEL WITH GFR
BUN: 21 mg/dL (ref 7–25)
CHLORIDE: 98 mmol/L (ref 98–110)
CO2: 25 mmol/L (ref 20–31)
Calcium: 9.1 mg/dL (ref 8.6–10.4)
Creat: 1.34 mg/dL — ABNORMAL HIGH (ref 0.60–0.93)
GFR, Est African American: 45 mL/min — ABNORMAL LOW (ref >=60)
GFR, Est Non African American: 39 mL/min — ABNORMAL LOW (ref >=60)
GLUCOSE: 338 mg/dL — AB (ref 65–99)
POTASSIUM: 5.4 mmol/L — AB (ref 3.5–5.3)
Sodium: 137 mmol/L (ref 135–146)

## 2015-04-14 LAB — TSH: TSH: 1.42 u[IU]/mL (ref 0.350–4.500)

## 2015-04-14 LAB — HEMOGLOBIN A1C
Hgb A1c MFr Bld: 11.1 % — ABNORMAL HIGH (ref <5.7)
Mean Plasma Glucose: 272 mg/dL — ABNORMAL HIGH (ref <117)

## 2015-04-14 LAB — MAGNESIUM: Magnesium: 2 mg/dL (ref 1.5–2.5)

## 2015-04-14 LAB — URIC ACID: URIC ACID, SERUM: 6.3 mg/dL (ref 2.4–7.0)

## 2015-04-14 MED ORDER — AZITHROMYCIN 250 MG PO TABS: ORAL_TABLET | ORAL | Status: DC

## 2015-04-14 MED ORDER — PREDNISONE 10 MG PO TABS: ORAL_TABLET | ORAL | Status: DC

## 2015-04-14 MED ORDER — BENZONATATE 200 MG PO CAPS: 200.0000 mg | ORAL_CAPSULE | Freq: Three times a day (TID) | ORAL | Status: DC | PRN

## 2015-04-14 MED ORDER — HYDROCODONE-ACETAMINOPHEN 5-325 MG PO TABS: ORAL_TABLET | ORAL | Status: AC

## 2015-04-14 NOTE — Progress Notes (Signed)
Patient ID: Janet Choi, female   DOB: Dec 08, 1940, 74 y.o.   MRN: [id number]  Medicare  Annual  Wellness Visit And  Comprehensive Evaluation & Examination   Assessment:   1. Essential hypertension  - EKG 12-Lead - TSH  2. Hyperlipidemia  - Lipid panel - TSH  3. Controlled type 2 diabetes mellitus with diabetic nephropathy, without long-term current use of insulin (HCC)  - Microalbumin / creatinine urine ratio - HM DIABETES FOOT EXAM - LOW EXTREMITY NEUR EXAM DOCUM - Hemoglobin A1c - Insulin, random  4. Vitamin D deficiency  - VITAMIN D 25 Hydroxy (Vit-D Deficiency, Fractures)  5. Type 2 diabetes mellitus with diabetic neuropathy, without long-term current use of insulin (Janet Choi)   6. Morbid obesity, unspecified obesity type (Janet Choi)   7. RLS (restless legs syndrome)   8. Depression screen   9. Screening for rectal cancer   10. At low risk for fall   11. BMI 38.0-38.9,adult   12. Tracheitis  - predniSONE (DELTASONE) 10 MG tablet; 1 tab 3 x day for 2 days, then 1 tab 2 x day for 2 days, then 1 tab 1 x day for 3 days  Dispense: 13 tablet; Refill: 0 - azithromycin (ZITHROMAX) 250 MG tablet; Take 2 tablets (500 mg) on  Day 1,  followed by 1 tablet (250 mg) once daily on Days 2 through 5.  Dispense: 6 each; Refill: 1 - HYDROcodone-acetaminophen (NORCO) 5-325 MG tablet; Take 1/2 to 1 tablet every 3 to 4 hours for severe cough  Dispense: 30 tablet; Refill: 0 - benzonatate (TESSALON) 200 MG capsule; Take 1 capsule (200 mg total) by mouth 3 (three) times daily as needed for cough.  Dispense: 30 capsule; Refill: 1  13. Medication management  - Urinalysis, Routine w reflex microscopic (not at Janet Choi) - Uric acid - CBC with Differential/Platelet - BASIC METABOLIC PANEL WITH GFR - Hepatic function panel - Magnesium  14. Need for prophylactic vaccination with tetanus-diphtheria (TD)  - DT Vaccine greater than 7yo IM  15. Medicare annual wellness visit,  subsequent   Plan:   During the course of the visit the patient was educated and counseled about appropriate screening and preventive services including:    Pneumococcal vaccine   Influenza vaccine  Td vaccine  Screening electrocardiogram  Bone densitometry screening  Colorectal cancer screening  Diabetes screening  Glaucoma screening  Nutrition counseling   Advanced directives: requested  Screening recommendations, referrals: Vaccinations:  Immunization History  Administered Date(s) Administered  . Hepatitis B 07/09/1999, 08/09/1999, 01/09/2000  . Influenza, High Dose Seasonal PF 03/20/2014  . Influenza-Unspecified 03/28/2015  . Pneumococcal Conjugate-13 03/20/2014  . Pneumococcal Polysaccharide-23 10/15/2009  . Td 03/19/2004  Shingles vaccine undecided  Nutrition assessed and recommended  Colonoscopy 04/15/2009 Recommended yearly ophthalmology/optometry visit for glaucoma screening and checkup Recommended yearly dental visit for hygiene and checkup Advanced directives - Yes  Conditions/risks identified: BMI: Discussed weight loss, diet, and increase physical activity.  Increase physical activity: AHA recommends 150 minutes of physical activity a week.  Medications reviewed Diabetes is not at goal, ACE/ARB therapy: Yes. Urinary Incontinence is not an issue: discussed non pharmacology and pharmacology options.  Fall risk: low- discussed PT, home fall assessment, medications.   Subjective:      Janet Choi is a 74 y.o. WWF who presents for Medicare Annual Wellness Visit and presents for a comprehensive evaluation, examination and management of multiple medical co-morbidities.  Date of last medicare wellness visit was 03/20/2014.  This very nice 74  y.o. Janet Choi presents for follow up with Hypertension, Hyperlipidemia, T2_DM w/ CKD and Vitamin D Deficiency. Also she's c/o persistent cough productive of a greenish sputum for several days. Patient has a chronic hx/o  poor dietary compliance and denial and lack of insight wrt her poor diet and eating habits.    Patient is treated for HTN & BP has been controlled at home. Today's BP: 134/72 mmHg. In 2011, she had a negative Cardiolite and 2D Echo. Patient has had no complaints of any cardiac type chest pain, palpitations, dyspnea/orthopnea/PND, dizziness, claudication, or dependent edema.   Hyperlipidemia is not controlled with diet. Patient endorses myalgias & intolerance to Statins, but is allegedly taking Zetia. Last Lipids were Cholesterol 310*; HDL 35*; LDL NOT CALC; & elevated Triglycerides 713 on 11/18/2014.    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  Last A1c was 11.7% on 11/18/2014.    Patient has hx/o low Vit B12 of 186 in 2012. Further, the patient also has history of Vitamin D Deficiency of 25 in 2008 and supplements vitamin D without any suspected side-effects. Last vitamin D was 43 on  11/18/2014.  Names of Other Physician/Practitioners you currently use: 1. [facility] here for primary care 2. Dr Janet Choi, eye doctor, last visit 02/11/2015 3. Dr Janet Choi, [place], dentist, last visit Circa June 2016and Janet Choi  Patient Care Team: Janet Pinto, MD as PCP - General (Internal Medicine) Janet Drought, MD as Consulting Physician (Ophthalmology) Janet Castle, MD as Consulting Physician (Gastroenterology) Janet Dresser, MD as Consulting Physician (Cardiology) Janet Putnam, MD as Consulting Physician (Oncology) Janet Ade, MD as Consulting Physician (Orthopedic Surgery)  Medication Review: Medication Sig  . buPROPion XL 150 MG 24 hr tablet Take 1 tab every morning.  Marland Kitchen VITAMIN D 2000 UNITS tablet Take 10,000 Units  daily.   . clonazePAM  1 MG tablet TAKE 1 TAB 4 TIMES A DAY   . FLUoxetine  40 MG capsule TAKE 1 CAP DAILY.  . furosemide  40 MG tablet Take 0.5 tab daily.  Marland Kitchen glimepiride  4 MG tablet TAKE 1 TAB 2TIMES DAILY.  Marland Kitchen losartan  50 MG tablet TAKE 1 TAB DAILY FOR BLOOD PRESSURE AND KIDNEY  . Magnesium 400 MG CAPS Take 1 cap 2  times daily.   . meloxicam  15 MG tablet TAKE 1 TAB EVERY DAY FOR PAIN AND INFLAMATION  . metFORMIN -XR 500 MG 24 hr tablet TAKE 2 TAB TWICE A DAY  . Ranitidine 150 mg at bedtime  . rOPINIRole (REQUIP) 1 MG tablet TAKE 1 TAB 4 TIMES A DAY FOR RESTLESS LEGS  . traMADol (ULTRAM) 50 MG tablet Take 1 tablet 4 x day if needed for cough or pain   Allergies  Allergen Reactions  . Aspirin   . Crestor [Rosuvastatin]   . Lipitor [Atorvastatin]   . Lyrica [Pregabalin] Other (See Comments)    Blurred vision  . Minocycline Hcl     REACTION: Reaction not known  . Oruvail [Ketoprofen]   . Tricor [Fenofibrate]   . Vasotec [Enalaprilat]   . Victoza [Liraglutide]   . Zinc    Current Problems (verified) Patient Active Problem List   Diagnosis Date Noted  . BMI 38.0-38.9,adult 04/14/2015  . RLS (restless legs syndrome) 03/20/2014  . T2_NIDDM w/Peripheral Sensory Neuropathy 03/20/2014  . Compliance  poor 11/28/2013  . Medication management 09/12/2013  . Morbid obesity (BMI 39) 05/14/2013  . Vitamin D deficiency 05/14/2013  . 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 Hyperlipidemia 02/24/2009  . Essential hypertension 02/24/2009  . Diverticulosis of large intestine 02/24/2009  . History of colonic polyps 02/24/2009   Screening Tests Health Maintenance  Topic Date Due  . COLONOSCOPY  11/13/1990  . ZOSTAVAX  11/12/2000  . MAMMOGRAM  02/15/2013  . TETANUS/TDAP  03/19/2014  . URINE MICROALBUMIN  11/29/2014  . FOOT EXAM  03/21/2015  . HEMOGLOBIN A1C  05/21/2015  . INFLUENZA VACCINE  12/09/2015  . OPHTHALMOLOGY EXAM  02/11/2016  . DEXA SCAN  Completed  . PNA vac Low Risk Adult  Completed   Immunization History  Administered  Date(s) Administered  . Hepatitis B 07/09/1999, 08/09/1999, 01/09/2000  . Influenza, High Dose Seasonal PF 03/20/2014  . Influenza-Unspecified 03/28/2015  . Pneumococcal Conjugate-13 03/20/2014  . Pneumococcal Polysaccharide-23 10/15/2009  . Td 03/19/2004   Preventative care: Last colonoscopy: 04/15/2009  Past Medical History  Diagnosis Date  . Diabetes mellitus without complication ([place])   . Hypertension   . Hyperlipidemia   . OSA (obstructive sleep apnea)   . DJD (degenerative joint disease)   . RLS (restless legs syndrome)   . Obesity   . Vitamin D deficiency    Past Surgical History  Procedure Laterality Date  . Joint replacement    . Cataract extraction     Risk Factors: Tobacco Social History  Substance Use Topics  . Smoking status: Former Smoker    Quit date: 05/11/1983  . Smokeless tobacco: None  . Alcohol Use: No   She does not smoke.  Patient is a former smoker. Are there smokers in your home (other than you)?  No Alcohol Current alcohol use: none  Caffeine Current caffeine use: coffee 3-4 cups /day  Exercise Current exercise: none  Nutrition/Diet Current diet: in general, an "unhealthy" diet  Cardiac risk factors: advanced age (older than 87 for men, 78 for women), diabetes mellitus, dyslipidemia, hypertension, obesity (BMI >= 30 kg/m2), sedentary lifestyle and smoking/ tobacco exposure.  Depression Screen (Note: if answer to either of the following is "Yes", a more complete depression screening is indicated)   Q1: Over the past two weeks, have you felt down, depressed or hopeless? No  Q2: Over the past two weeks, have you felt little interest or pleasure in doing things? No  Have you lost interest or pleasure in daily life? No  Do you often feel hopeless? No  Do you cry easily over simple problems? No  Activities of Daily Living In your present state of health, do you have any difficulty performing the following activities?:  Driving?  No Managing money?  No Feeding yourself? No Getting from bed to chair? No Climbing a flight of stairs? No Preparing food and eating?: No Bathing or showering? No Getting dressed: No Getting to the toilet? No Using the toilet:No Moving around from place to place: No In the past year have you fallen or had a near fall?:No   Are you sexually active?  No  Do you have more than one partner?  No  Vision Difficulties: No  Hearing Difficulties: No Do you often ask people to speak up or repeat themselves? No Do you experience ringing or noises in your ears? No Do you have difficulty understanding soft or whispered voices? Sometimes.  Cognition  Do you feel that you have a problem with memory?No  Do  you often misplace items? No  Do you feel safe at home?  Yes  Advanced directives Does patient have a [place]? Yes Does patient have a Living Will? Yes  ROS: Constitutional: Denies fever, chills, weight loss/gain, headaches, insomnia, fatigue, night sweats, and change in appetite. Eyes: Denies redness, blurred vision, diplopia, discharge, itchy, watery eyes.  ENT: Denies discharge, congestion, post nasal drip, epistaxis, sore throat, earache, hearing loss, dental pain, Tinnitus, Vertigo, Sinus pain, snoring.  Cardio: Denies chest pain, palpitations, irregular heartbeat, syncope, dyspnea, diaphoresis, orthopnea, PND, claudication, edema Respiratory: denies dyspnea, DOE, pleurisy, hoarseness, laryngitis, wheezing. (+) recent cough as above. Gastrointestinal: Denies dysphagia, heartburn, reflux, water brash, pain, cramps, nausea, vomiting, bloating, diarrhea, constipation, hematemesis, melena, hematochezia, jaundice, hemorrhoids Genitourinary: Denies dysuria, frequency, urgency, nocturia, hesitancy, discharge, hematuria, flank pain Breast: Breast lumps, nipple discharge, bleeding.  Musculoskeletal: Denies arthralgia, myalgia, stiffness, Jt. Swelling, pain, limp, and  strain/sprain. Denies falls. Skin: Denies puritis, rash, hives, warts, acne, eczema, changing in skin lesion Neuro: No weakness, tremor, incoordination, spasms, paresthesia, pain Psychiatric: Denies confusion, memory loss, sensory loss. Denies Depression. Endocrine: Denies change in weight, skin, hair change, nocturia, and paresthesia, diabetic polys, visual blurring, hyper / hypo glycemic episodes.  Heme/Lymph: No excessive bleeding, bruising, enlarged lymph nodes  Objective:     BP 134/72 mmHg  Pulse 92  Temp(Src) 97.9 F (36.6 C)  Resp 16  Ht 5' 2.5" (1.588 m)  Wt 213 lb (96.616 kg)  BMI 38.31 kg/m2  General Appearance: Over nourished, alert, obese  female and in no apparent distress. Eyes: PERRLA, EOMs, conjunctiva no swelling or erythema, normal fundi and vessels. Sinuses: No frontal/maxillary tenderness ENT/Mouth: EACs patent / TMs  nl. Nares clear without erythema, swelling, mucoid exudates. Oral hygiene is good. No erythema, swelling, or exudate. Tongue normal, non-obstructing. Tonsils not swollen or erythematous. Hearing normal.  Neck: Supple, thyroid normal. No bruits, nodes or JVD. Respiratory: Respiratory effort normal.  BS equal and scattered rales clearing with cough and no  rhonci, wheezing or stridor. Cardio: Heart sounds are normal with regular rate and rhythm and no murmurs, rubs or gallops. Peripheral pulses are normal and equal bilaterally without edema. No aortic or femoral bruits. Chest: symmetric with normal excursions and percussion. Breasts: Symmetric, without lumps, nipple discharge, retractions, or fibrocystic changes.  Abdomen: Rotund with abundant panniculus, soft  with nl bowel sounds. Nontender, no guarding, rebound, hernias, masses, or organomegaly.  Lymphatics: Non tender without lymphadenopathy.  Musculoskeletal: Full ROM all peripheral extremities, joint stability, 5/5 strength, and normal gait. Skin: Warm and dry without rashes, lesions, cyanosis,  clubbing or  ecchymosis.  Neuro: Cranial nerves intact, DTR's 1+ in UE's and KJ's & AJ's absent . Normal muscle tone, no cerebellar symptoms. Sensation intact to touch and vibratory to the toes, but decreased by monofilament testing over the distal feet & toes.   Pysch: Alert and oriented X 3, normal affect, with Insight and Judgment skewed by her denial of the severity of her diabetes. .   Cognitive Testing  Alert? Yes  Normal Appearance?Yes  Oriented to person? Yes  Place? Yes   Time? Yes  Recall of three objects?  Yes  Can perform simple calculations? Yes  Displays appropriate judgment? Yes  Can read the correct time from a watch/clock?Yes  Medicare Attestation I have personally reviewed: The patient's medical and social history Their use of alcohol, tobacco or illicit drugs Their current medications and supplements The patient's functional ability including ADLs,fall risks, home safety risks,  cognitive, and hearing and visual impairment Diet and physical activities Evidence for depression or mood disorders Overdue MGM and encouraged to schedule  The patient's weight, height, BMI, and visual acuity have been recorded in the chart.  I have made referrals, counseling, and provided education to the patient based on review of the above and I have provided the patient with a written personalized care plan for preventive services.  Over 40 minutes of exam, counseling, chart review was performed.  Abu Heavin DAVID, MD   04/14/2015

## 2015-04-14 NOTE — Patient Instructions (Signed)

## 2015-04-15 ENCOUNTER — Telehealth: Payer: Self-pay

## 2015-04-15 LAB — URINALYSIS, ROUTINE W REFLEX MICROSCOPIC
BILIRUBIN URINE: NEGATIVE
HGB URINE DIPSTICK: NEGATIVE
Ketones, ur: NEGATIVE
LEUKOCYTES UA: NEGATIVE
Nitrite: NEGATIVE
PROTEIN: NEGATIVE
Specific Gravity, Urine: 1.008 (ref 1.001–1.035)
pH: 5 (ref 5.0–8.0)

## 2015-04-15 LAB — MICROALBUMIN / CREATININE URINE RATIO
Creatinine, Urine: 17 mg/dL — ABNORMAL LOW (ref 20–320)
Microalb Creat Ratio: 35 mcg/mg creat — ABNORMAL HIGH (ref <30)
Microalb, Ur: 0.6 mg/dL

## 2015-04-15 LAB — VITAMIN D 25 HYDROXY (VIT D DEFICIENCY, FRACTURES): Vit D, 25-Hydroxy: 43 ng/mL (ref 30–100)

## 2015-04-15 LAB — VITAMIN B12: Vitamin B-12: 252 pg/mL (ref 211–911)

## 2015-04-15 LAB — INSULIN, RANDOM: INSULIN: 19.8 u[IU]/mL — AB (ref 2.0–19.6)

## 2015-04-15 NOTE — Telephone Encounter (Signed)
Pt made aware of lab results. Would like a renewal of her parking placard and Rx for CPAP.

## 2015-04-16 ENCOUNTER — Encounter: Payer: Self-pay | Admitting: *Deleted

## 2015-04-16 DIAGNOSIS — Z9989 Dependence on other enabling machines and devices: Secondary | ICD-10-CM

## 2015-04-16 DIAGNOSIS — G4733 Obstructive sleep apnea (adult) (pediatric): Secondary | ICD-10-CM | POA: Insufficient documentation

## 2015-04-17 ENCOUNTER — Other Ambulatory Visit: Payer: Self-pay | Admitting: Internal Medicine

## 2015-04-20 ENCOUNTER — Other Ambulatory Visit: Payer: Self-pay | Admitting: Internal Medicine

## 2015-04-28 ENCOUNTER — Other Ambulatory Visit: Payer: Self-pay | Admitting: Internal Medicine

## 2015-04-28 ENCOUNTER — Other Ambulatory Visit: Payer: Self-pay | Admitting: *Deleted

## 2015-04-28 MED ORDER — CLONAZEPAM 1 MG PO TABS: 1.0000 mg | ORAL_TABLET | Freq: Three times a day (TID) | ORAL | Status: DC | PRN

## 2015-05-20 ENCOUNTER — Other Ambulatory Visit: Payer: Self-pay | Admitting: Internal Medicine

## 2015-05-20 ENCOUNTER — Telehealth: Payer: Self-pay | Admitting: *Deleted

## 2015-05-20 MED ORDER — FLUCONAZOLE 150 MG PO TABS: ORAL_TABLET | ORAL | Status: DC

## 2015-05-20 NOTE — Telephone Encounter (Signed)
Patient called and states she has a vaginal infection and asked for an RX.  Per Dr Melford Aase, it is OK to send an RX for Diflucan.

## 2015-06-01 DIAGNOSIS — G4733 Obstructive sleep apnea (adult) (pediatric): Secondary | ICD-10-CM | POA: Diagnosis not present

## 2015-06-03 ENCOUNTER — Encounter: Payer: Self-pay | Admitting: Gastroenterology

## 2015-06-18 ENCOUNTER — Encounter: Payer: Self-pay | Admitting: Internal Medicine

## 2015-06-18 ENCOUNTER — Ambulatory Visit (INDEPENDENT_AMBULATORY_CARE_PROVIDER_SITE_OTHER): Payer: PPO | Admitting: Internal Medicine

## 2015-06-18 VITALS — BP 144/70 | HR 84 | Temp 98.0°F | Resp 18 | Ht 62.5 in | Wt 215.0 lb

## 2015-06-18 DIAGNOSIS — G2581 Restless legs syndrome: Secondary | ICD-10-CM | POA: Diagnosis not present

## 2015-06-18 MED ORDER — ROPINIROLE HCL 3 MG PO TABS: 3.0000 mg | ORAL_TABLET | Freq: Every day | ORAL | Status: DC

## 2015-06-18 MED ORDER — CLOTRIMAZOLE-BETAMETHASONE 1-0.05 % EX CREA: 1.0000 "application " | TOPICAL_CREAM | Freq: Two times a day (BID) | CUTANEOUS | Status: DC

## 2015-06-18 MED ORDER — FLUCONAZOLE 150 MG PO TABS: 150.0000 mg | ORAL_TABLET | Freq: Once | ORAL | Status: DC

## 2015-06-18 NOTE — Patient Instructions (Addendum)
Take 1 tablet with dinner of the ropinerole, and then at bedtime take 3 mg or 3 tablets until you run out of tablets. Let see if this will help your restless legs out.  Please continue to use heat as needed on the left leg.     Please take 1 tablet of diflucan  Please use the vaginal cream TID as needed.  You can go to [facility] and ask for a free hearing test there.

## 2015-06-18 NOTE — Progress Notes (Signed)
Subjective:    Patient ID: Janet Choi, female    DOB: 1941-01-05, 75 y.o.   MRN: [id number]  HPI  Patient presents to the office for evaluation of restless leg syndrome which has been worse for the past several weeks.  The restlessness starts around 6 pm.  It is worse when she is in bed.  She is taking requip currently.  She reports that the requip isn't helping.  She reports that she is under a lot of stress.  Her son has been arrested, her furnace went out and her car broke down.      She also reports that she thinks that she is having a yeast infection.  She thinks that it is a rash that is on the external genitalia.  She thinks it may be related to the pads that she wears them she changes them very quickly.  She hasn't been checking blood sugars.  She reports that she hasn't been checking it.  No urinary symptoms.  She did report that she has been taking invokana again.  She has used a cream but it hasn't helped much.  No change in personal products.     Review of Systems  Respiratory: Negative for chest tightness, shortness of breath and wheezing.   Cardiovascular: Negative for chest pain and palpitations.  Genitourinary: Positive for vaginal pain. Negative for dysuria, urgency, frequency, hematuria, vaginal bleeding, vaginal discharge and difficulty urinating.  Musculoskeletal: Positive for myalgias, joint swelling and arthralgias.       Objective:   Physical Exam  Constitutional: She is oriented to person, place, and time. She appears well-developed and well-nourished. No distress.  HENT:  Head: Normocephalic.  Mouth/Throat: Oropharynx is clear and moist. No oropharyngeal exudate.  Eyes: Conjunctivae are normal. No scleral icterus.  Neck: Normal range of motion. Neck supple. No JVD present. No thyromegaly present.  Cardiovascular: Normal rate, regular rhythm, normal heart sounds and intact distal pulses.  Exam reveals no gallop and no friction rub.   No murmur  heard. Pulmonary/Chest: Effort normal and breath sounds normal. No respiratory distress. She has no wheezes. She has no rales. She exhibits no tenderness.  Abdominal: Soft. Bowel sounds are normal. She exhibits no distension and no mass. There is no tenderness. There is no rebound and no guarding.  Genitourinary: No labial fusion. There is rash on the right labia. There is no tenderness, lesion or injury on the right labia. There is rash on the left labia. There is no tenderness, lesion or injury on the left labia.  Flat maculopapular erythematous beefy rash with swelling to the vulva.  No visible vaginal discharge.  Negative whiff test.  Musculoskeletal:  Tenderness to palpation of the left IT band.  Full ROM of the left hip and knee.    Lymphadenopathy:    She has no cervical adenopathy.  Neurological: She is alert and oriented to person, place, and time.  Skin: Skin is warm and dry. She is not diaphoretic.  Psychiatric: She has a normal mood and affect. Her behavior is normal. Judgment and thought content normal.  Nursing note and vitals reviewed.   Filed Vitals:   06/18/15 1523  BP: 144/70  Pulse: 84  Temp: 98 F (36.7 C)  Resp: 18         Assessment & Plan:    1. RLS (restless legs syndrome) -change Requip to 1 mg in the evening at dinner and 3 mg at bedtime.   -likely due to increased stress -  rOPINIRole (REQUIP) 3 MG tablet; Take 1 tablet (3 mg total) by mouth at bedtime.  Dispense: 120 tablet; Refill: 5  2.  Vulvovaginal candidiasis -lortisone cream -diflucan 150 mg  Patient was told again that she should not be taking invokana due to kidney function.  She also likely needs to be taken off mobic but cannot have prednisone for joint pain secondary to her uncontrolled diabetes.

## 2015-06-28 ENCOUNTER — Other Ambulatory Visit: Payer: Self-pay | Admitting: Internal Medicine

## 2015-07-02 DIAGNOSIS — G4733 Obstructive sleep apnea (adult) (pediatric): Secondary | ICD-10-CM | POA: Diagnosis not present

## 2015-07-08 DIAGNOSIS — M545 Low back pain: Secondary | ICD-10-CM | POA: Diagnosis not present

## 2015-07-09 ENCOUNTER — Other Ambulatory Visit: Payer: Self-pay | Admitting: Orthopedic Surgery

## 2015-07-09 DIAGNOSIS — M5441 Lumbago with sciatica, right side: Secondary | ICD-10-CM

## 2015-07-15 ENCOUNTER — Ambulatory Visit
Admission: RE | Admit: 2015-07-15 | Discharge: 2015-07-15 | Disposition: A | Discharge status: Home / Self Care | Payer: PPO | Source: Ambulatory Visit | Attending: Orthopedic Surgery | Admitting: Orthopedic Surgery

## 2015-07-15 DIAGNOSIS — M5441 Lumbago with sciatica, right side: Secondary | ICD-10-CM

## 2015-07-15 DIAGNOSIS — M4806 Spinal stenosis, lumbar region: Secondary | ICD-10-CM | POA: Diagnosis not present

## 2015-07-17 DIAGNOSIS — M5441 Lumbago with sciatica, right side: Secondary | ICD-10-CM | POA: Diagnosis not present

## 2015-07-17 DIAGNOSIS — M545 Low back pain: Secondary | ICD-10-CM | POA: Diagnosis not present

## 2015-07-17 DIAGNOSIS — M5442 Lumbago with sciatica, left side: Secondary | ICD-10-CM | POA: Diagnosis not present

## 2015-07-21 ENCOUNTER — Encounter: Payer: Self-pay | Admitting: Internal Medicine

## 2015-07-21 ENCOUNTER — Other Ambulatory Visit: Payer: Self-pay | Admitting: Internal Medicine

## 2015-07-21 ENCOUNTER — Ambulatory Visit (INDEPENDENT_AMBULATORY_CARE_PROVIDER_SITE_OTHER): Payer: PPO | Admitting: Internal Medicine

## 2015-07-21 VITALS — BP 152/74 | HR 100 | Temp 98.0°F | Resp 18 | Ht 62.5 in | Wt 216.0 lb

## 2015-07-21 DIAGNOSIS — E1129 Type 2 diabetes mellitus with other diabetic kidney complication: Secondary | ICD-10-CM | POA: Diagnosis not present

## 2015-07-21 DIAGNOSIS — E782 Mixed hyperlipidemia: Secondary | ICD-10-CM

## 2015-07-21 DIAGNOSIS — E1140 Type 2 diabetes mellitus with diabetic neuropathy, unspecified: Secondary | ICD-10-CM | POA: Diagnosis not present

## 2015-07-21 DIAGNOSIS — E1121 Type 2 diabetes mellitus with diabetic nephropathy: Secondary | ICD-10-CM

## 2015-07-21 DIAGNOSIS — I10 Essential (primary) hypertension: Secondary | ICD-10-CM | POA: Diagnosis not present

## 2015-07-21 DIAGNOSIS — E559 Vitamin D deficiency, unspecified: Secondary | ICD-10-CM

## 2015-07-21 DIAGNOSIS — Z79899 Other long term (current) drug therapy: Secondary | ICD-10-CM

## 2015-07-21 DIAGNOSIS — M4806 Spinal stenosis, lumbar region: Secondary | ICD-10-CM | POA: Diagnosis not present

## 2015-07-21 LAB — CBC WITH DIFFERENTIAL/PLATELET
Basophils Absolute: 0 10*3/uL (ref 0.0–0.1)
Basophils Relative: 0 % (ref 0–1)
EOS ABS: 0.2 10*3/uL (ref 0.0–0.7)
EOS PCT: 4 % (ref 0–5)
HCT: 28.8 % — ABNORMAL LOW (ref 36.0–46.0)
Hemoglobin: 8.3 g/dL — ABNORMAL LOW (ref 12.0–15.0)
LYMPHS ABS: 1.1 10*3/uL (ref 0.7–4.0)
Lymphocytes Relative: 21 % (ref 12–46)
MCH: 21.6 pg — AB (ref 26.0–34.0)
MCHC: 28.8 g/dL — ABNORMAL LOW (ref 30.0–36.0)
MCV: 75 fL — AB (ref 78.0–100.0)
MONOS PCT: 6 % (ref 3–12)
MPV: 9.5 fL (ref 8.6–12.4)
Monocytes Absolute: 0.3 10*3/uL (ref 0.1–1.0)
Neutro Abs: 3.7 10*3/uL (ref 1.7–7.7)
Neutrophils Relative %: 69 % (ref 43–77)
PLATELETS: 259 10*3/uL (ref 150–400)
RBC: 3.84 MIL/uL — ABNORMAL LOW (ref 3.87–5.11)
RDW: 16.3 % — AB (ref 11.5–15.5)
WBC: 5.3 10*3/uL (ref 4.0–10.5)

## 2015-07-21 MED ORDER — GLUCOSE BLOOD VI STRP: ORAL_STRIP | Status: DC

## 2015-07-21 NOTE — Progress Notes (Signed)
Assessment and Plan:  Hypertension:  -Continue medication -monitor blood pressure at home. -Continue DASH diet -Reminder to go to the ER if any CP, SOB, nausea, dizziness, severe HA, changes vision/speech, left arm numbness and tingling and jaw pain.  Cholesterol - Continue diet and exercise -Check cholesterol.   Diabetes with diabetic chronic kidney disease  -likely needs additional medication to control BS -Continue diet and exercise.  -Check A1C  Vitamin D Def -check level -continue medications.  Chronic back pain -recommended trying gabapentin -cont with epidural injections -cont with PT -attempt to avoid surgery if possible   Continue diet and meds as discussed. Further disposition pending results of labs. Discussed med's effects and SE's.    HPI 75 y.o. female  presents for 3 month follow up with hypertension, hyperlipidemia, diabetes and vitamin D deficiency.   Her blood pressure has not been controlled at home, today [name] is BP: (!) 152/74 mmHg.She does not workout. She denies chest pain, shortness of breath, dizziness.  She does not workout due to the severe back pain and leg pain.   She is on cholesterol medication and denies myalgias. Her cholesterol is at goal. The cholesterol was:  04/14/2015: Cholesterol 259*; HDL 38*; LDL Cholesterol NOT CALC; Triglycerides 496*   She has been working on diet and exercise for diabetes with diabetic chronic kidney disease, she is on bASA, she is on ACE/ARB, and denies  foot ulcerations, hyperglycemia, hypoglycemia , increased appetite, nausea, paresthesia of the feet, polydipsia, polyuria, visual disturbances, vomiting and weight loss. Last A1C was: 04/14/2015: Hgb A1c MFr Bld 11.1*   Patient is on Vitamin D supplement. 04/14/2015: Vit D, 25-Hydroxy 43  She reports that she did see Dr. Lynann Bologna and he recommended using epidural shots and also doing PT.  IF this doesn't work he does recommend a multilevel fusion in the lumbar  spine.    She reports that she does have a lot of issues with pain and is having a lot of fatigue.    She is due to see Dr. Edison Pace this month to get set up for a colonoscopy.    She does need to have a mammogram.    Current Medications:  Current Outpatient Prescriptions on File Prior to Visit  Medication Sig Dispense Refill  . Cholecalciferol (VITAMIN D) 2000 UNITS tablet Take 10,000 Units by mouth daily.     . clonazePAM (KLONOPIN) 1 MG tablet Take 1 tablet (1 mg total) by mouth 3 (three) times daily as needed for anxiety. 90 tablet 0  . FLUoxetine (PROZAC) 40 MG capsule TAKE 1 CAPSULE BY MOUTH DAILY. 90 capsule 1  . furosemide (LASIX) 40 MG tablet Take 0.5 tablets (20 mg total) by mouth daily. 90 tablet 2  . glimepiride (AMARYL) 4 MG tablet TAKE 1 TABLET (4 MG TOTAL) BY MOUTH 2 (TWO) TIMES DAILY. 180 tablet 99  . losartan (COZAAR) 50 MG tablet TAKE 1 TABLET BY MOUTH DAILY FOR BLOOD PRESSURE AND KIDNEY 90 tablet 2  . Magnesium 400 MG CAPS Take 1 capsule by mouth 2 (two) times daily.     . meloxicam (MOBIC) 15 MG tablet TAKE 1 TABLET EVERY DAY FOR PAIN AND INFLAMATION 90 tablet 1  . metFORMIN (GLUCOPHAGE-XR) 500 MG 24 hr tablet TAKE 2 TABLETS TWICE A DAY 180 tablet 1  . OVER THE COUNTER MEDICATION 150 mg at bedtime. [facility] acid reduced 75 mg at bedtime    . rOPINIRole (REQUIP) 3 MG tablet Take 1 tablet (3 mg total) by mouth  at bedtime. 120 tablet 5   No current facility-administered medications on file prior to visit.   Medical History:  Past Medical History  Diagnosis Date  . Diabetes mellitus without complication ([place])   . Hypertension   . Hyperlipidemia   . OSA (obstructive sleep apnea)   . DJD (degenerative joint disease)   . RLS (restless legs syndrome)   . Obesity   . Vitamin D deficiency    Allergies:  Allergies  Allergen Reactions  . Aspirin   . Crestor [Rosuvastatin]   . Lipitor [Atorvastatin]   . Lyrica [Pregabalin] Other (See Comments)    Blurred vision  .  Minocycline Hcl     REACTION: Reaction not known  . Oruvail [Ketoprofen]   . Tricor [Fenofibrate]   . Vasotec [Enalaprilat]   . Victoza [Liraglutide]   . Zinc      Review of Systems:  Review of Systems  Constitutional: Negative for fever, chills and malaise/fatigue.  HENT: Negative for congestion, ear pain and sore throat.   Eyes: Negative.   Respiratory: Negative for cough, shortness of breath and wheezing.   Cardiovascular: Negative for chest pain, palpitations and leg swelling.  Gastrointestinal: Positive for diarrhea. Negative for heartburn, abdominal pain, constipation, blood in stool and melena.  Genitourinary: Negative for dysuria, urgency, frequency and hematuria.  Neurological: Negative for dizziness, loss of consciousness and headaches.  Psychiatric/Behavioral: Positive for depression. The patient is nervous/anxious. The patient does not have insomnia.     Family history- Review and unchanged  Social history- Review and unchanged  Physical Exam: BP 152/74 mmHg  Pulse 100  Temp(Src) 98 F (36.7 C) (Temporal)  Resp 18  Ht 5' 2.5" (1.588 m)  Wt 216 lb (97.977 kg)  BMI 38.85 kg/m2 Wt Readings from Last 3 Encounters:  07/21/15 216 lb (97.977 kg)  07/15/15 212 lb (96.163 kg)  06/18/15 215 lb (97.523 kg)   General Appearance: Well nourished well developed, non-toxic appearing, in no apparent distress. Eyes: PERRLA, EOMs, conjunctiva no swelling or erythema ENT/Mouth: Ear canals clear with no erythema, swelling, or discharge.  TMs normal bilaterally, oropharynx clear, moist, with no exudate.   Neck: Supple, thyroid normal, no JVD, no cervical adenopathy.  Respiratory: Respiratory effort normal, breath sounds clear A&P, no wheeze, rhonchi or rales noted.  No retractions, no accessory muscle usage Cardio: RRR with no MRGs. No noted edema.  Abdomen: Soft, + BS.  Non tender, no guarding, rebound, hernias, masses. Musculoskeletal: Full ROM, 5/5 strength, bent over and slow  gait Skin: Warm, dry without rashes, lesions, ecchymosis.  Neuro: Awake and oriented X 3, Cranial nerves intact. No cerebellar symptoms.  Psych: normal affect, Insight and Judgment appropriate.    [name], PA-C 3:12 PM [facility]

## 2015-07-22 LAB — BASIC METABOLIC PANEL WITH GFR
BUN: 19 mg/dL (ref 7–25)
CALCIUM: 8.9 mg/dL (ref 8.6–10.4)
CO2: 25 mmol/L (ref 20–31)
CREATININE: 1.09 mg/dL — AB (ref 0.60–0.93)
Chloride: 100 mmol/L (ref 98–110)
GFR, EST AFRICAN AMERICAN: 58 mL/min — AB (ref >=60)
GFR, Est Non African American: 50 mL/min — ABNORMAL LOW (ref >=60)
Glucose, Bld: 398 mg/dL — ABNORMAL HIGH (ref 65–99)
Potassium: 4.9 mmol/L (ref 3.5–5.3)
Sodium: 135 mmol/L (ref 135–146)

## 2015-07-22 LAB — HEMOGLOBIN A1C
Hgb A1c MFr Bld: 10 % — ABNORMAL HIGH (ref <5.7)
MEAN PLASMA GLUCOSE: 240 mg/dL — AB (ref <117)

## 2015-07-22 LAB — HEPATIC FUNCTION PANEL
ALBUMIN: 3.8 g/dL (ref 3.6–5.1)
ALT: 8 U/L (ref 6–29)
AST: 9 U/L — ABNORMAL LOW (ref 10–35)
Alkaline Phosphatase: 61 U/L (ref 33–130)
BILIRUBIN TOTAL: 0.2 mg/dL (ref 0.2–1.2)
Bilirubin, Direct: <0.1 mg/dL (ref <=0.2)
Total Protein: 6 g/dL — ABNORMAL LOW (ref 6.1–8.1)

## 2015-07-22 LAB — LIPID PANEL
CHOL/HDL RATIO: 8.1 ratio — AB (ref <=5.0)
CHOLESTEROL: 251 mg/dL — AB (ref 125–200)
HDL: 31 mg/dL — AB (ref >=46)
Triglycerides: 677 mg/dL — ABNORMAL HIGH (ref <150)

## 2015-07-22 LAB — TSH: TSH: 1.09 mIU/L

## 2015-07-24 ENCOUNTER — Ambulatory Visit (INDEPENDENT_AMBULATORY_CARE_PROVIDER_SITE_OTHER): Payer: PPO | Admitting: Internal Medicine

## 2015-07-24 ENCOUNTER — Ambulatory Visit (INDEPENDENT_AMBULATORY_CARE_PROVIDER_SITE_OTHER): Payer: PPO | Admitting: Gastroenterology

## 2015-07-24 ENCOUNTER — Encounter: Payer: Self-pay | Admitting: Gastroenterology

## 2015-07-24 VITALS — BP 152/70 | HR 88 | Ht 62.5 in | Wt 216.8 lb

## 2015-07-24 VITALS — BP 138/70 | HR 82 | Temp 98.0°F | Resp 18 | Ht 62.5 in

## 2015-07-24 DIAGNOSIS — D509 Iron deficiency anemia, unspecified: Secondary | ICD-10-CM

## 2015-07-24 DIAGNOSIS — E785 Hyperlipidemia, unspecified: Secondary | ICD-10-CM | POA: Diagnosis not present

## 2015-07-24 DIAGNOSIS — D649 Anemia, unspecified: Secondary | ICD-10-CM

## 2015-07-24 DIAGNOSIS — E1310 Other specified diabetes mellitus with ketoacidosis without coma: Secondary | ICD-10-CM

## 2015-07-24 DIAGNOSIS — R197 Diarrhea, unspecified: Secondary | ICD-10-CM

## 2015-07-24 DIAGNOSIS — E1110 Type 2 diabetes mellitus with ketoacidosis without coma: Secondary | ICD-10-CM

## 2015-07-24 DIAGNOSIS — E538 Deficiency of other specified B group vitamins: Secondary | ICD-10-CM | POA: Diagnosis not present

## 2015-07-24 LAB — CBC WITH DIFFERENTIAL/PLATELET
BASOS PCT: 0 % (ref 0–1)
Basophils Absolute: 0 10*3/uL (ref 0.0–0.1)
EOS PCT: 6 % — AB (ref 0–5)
Eosinophils Absolute: 0.3 10*3/uL (ref 0.0–0.7)
HCT: 28.7 % — ABNORMAL LOW (ref 36.0–46.0)
HEMOGLOBIN: 8.5 g/dL — AB (ref 12.0–15.0)
Lymphocytes Relative: 25 % (ref 12–46)
Lymphs Abs: 1.4 10*3/uL (ref 0.7–4.0)
MCH: 21.6 pg — ABNORMAL LOW (ref 26.0–34.0)
MCHC: 29.6 g/dL — AB (ref 30.0–36.0)
MCV: 73 fL — ABNORMAL LOW (ref 78.0–100.0)
MONO ABS: 0.3 10*3/uL (ref 0.1–1.0)
MPV: 9.3 fL (ref 8.6–12.4)
Monocytes Relative: 6 % (ref 3–12)
Neutro Abs: 3.5 10*3/uL (ref 1.7–7.7)
Neutrophils Relative %: 63 % (ref 43–77)
PLATELETS: 271 10*3/uL (ref 150–400)
RBC: 3.93 MIL/uL (ref 3.87–5.11)
RDW: 16 % — ABNORMAL HIGH (ref 11.5–15.5)
WBC: 5.6 10*3/uL (ref 4.0–10.5)

## 2015-07-24 LAB — IRON AND TIBC
%SAT: 4 % — ABNORMAL LOW (ref 11–50)
IRON: 19 ug/dL — AB (ref 45–160)
TIBC: 530 ug/dL — ABNORMAL HIGH (ref 250–450)
UIBC: 511 ug/dL — AB (ref 125–400)

## 2015-07-24 LAB — RETICULOCYTES
ABS RETIC: 82.5 10*3/uL (ref 19.0–186.0)
RBC.: 3.93 MIL/uL (ref 3.87–5.11)
Retic Ct Pct: 2.1 % (ref 0.4–2.3)

## 2015-07-24 LAB — FERRITIN: Ferritin: 6 ng/mL — ABNORMAL LOW (ref 20–288)

## 2015-07-24 LAB — VITAMIN B12: VITAMIN B 12: 272 pg/mL (ref 200–1100)

## 2015-07-24 NOTE — Progress Notes (Signed)
[facility] Consult Note:  History: Janet Choi 07/24/2015  Referring physician: Alesia Richards, MD  Reason for consult/chief complaint: Diarrhea and Anemia   Subjective HPI:  Janet Choi was seen in consult today for diarrhea. About 6 months she has had episodes characterized by severe urgency for a BM, followed by a loose p.m. and usually she cannot get to the bathroom on time. This has occurred while shopping at [place], on the way home from a friend's house for dinner, or even last evening while at home. It happens perhaps every 3 or 4 weeks, and in between her bowel habits are regular with formed stool. There is no nocturnal diarrhea or incontinence. Janet Choi saw Dr. Deatra Ina over a couple of years from 2008 2010 for anemia. EGD in April 2008 had no source of anemia, small bowel video capsule at that time also unremarkable for anemia. Colonoscopy in December 2010 found nor source of anemia.  Janet Choi has been feeling run down lately, and labs earlier this week indicate her hemoglobin is down to 8.3 with a low MCV. He is seeing her PCP later today, and believes they are planning further lab work. Janet Choi denies rectal bleeding abdominal pain (even with these diarrhea episodes), early satiety nausea or vomiting. She has no foreign travel, no antibiotic use, and does not believe there been any medicine or dose changes in the time before the onset of this diarrhea. ROS:  Review of Systems  Constitutional: Positive for fatigue. Negative for appetite change and unexpected weight change.  HENT: Negative for mouth sores and voice change.   Eyes: Negative for pain and redness.  Respiratory: Negative for cough and shortness of breath.   Cardiovascular: Negative for chest pain and palpitations.  Genitourinary: Negative for dysuria and hematuria.  Musculoskeletal: Positive for back pain. Negative for myalgias and arthralgias.  Skin: Negative for pallor and rash.  Neurological: Negative  for weakness and headaches.  Hematological: Negative for adenopathy.  Psychiatric/Behavioral: Positive for dysphoric mood.   In particular, she has significant low back and bilateral legpain from what sounds like spinal stenosis.she will soon be having a back injection, and is hoping to avoid surgery.  Past Medical History: Past Medical History  Diagnosis Date  . Diabetes mellitus without complication ([place])   . Hypertension   . Hyperlipidemia   . OSA (obstructive sleep apnea)   . DJD (degenerative joint disease)   . RLS (restless legs syndrome)   . Obesity   . Vitamin D deficiency   . Anxiety   . IBS (irritable bowel syndrome)      Past Surgical History: Past Surgical History  Procedure Laterality Date  . Total knee arthroplasty Bilateral   . Cataract extraction Bilateral   . Rotator cuff repair Bilateral   . Lumbar disc surgery      x 2  . Appendectomy       Family History: Family History  Problem Relation Age of Onset  . Heart disease Mother   . Colon cancer Neg Hx   . Colon polyps Neg Hx   . Esophageal cancer Neg Hx   . Pancreatic cancer Neg Hx   . Stomach cancer Neg Hx   . Kidney disease Father   . Liver disease Neg Hx   . Diabetes Neg Hx    No thalasemia  Social History: Social History   Social History  . Marital Status: Single    Spouse Name: N/A  . Number of Children: N/A  . Years of Education: N/A  Social History Main Topics  . Smoking status: Former Smoker    Quit date: 05/11/1983  . Smokeless tobacco: Never Used  . Alcohol Use: No  . Drug Use: No  . Sexual Activity: Not Asked   Other Topics Concern  . None   Social History Narrative    Allergies: Allergies  Allergen Reactions  . Aspirin     Large doses per patient  . Crestor [Rosuvastatin]   . Lipitor [Atorvastatin]   . Lyrica [Pregabalin] Other (See Comments)    Blurred vision  . Minocycline Hcl     REACTION: Reaction not known  . Oruvail [Ketoprofen]   . Tricor  [Fenofibrate]   . Vasotec [Enalaprilat]   . Victoza [Liraglutide]   . Zinc     Outpatient Meds: Current Outpatient Prescriptions  Medication Sig Dispense Refill  . Cholecalciferol (VITAMIN D) 2000 UNITS tablet Take 10,000 Units by mouth daily.     . clonazePAM (KLONOPIN) 1 MG tablet TAKE 1 TABLET THREE TIMES A DAY 90 tablet 0  . FLUoxetine (PROZAC) 40 MG capsule TAKE 1 CAPSULE BY MOUTH DAILY. 90 capsule 1  . furosemide (LASIX) 40 MG tablet Take 0.5 tablets (20 mg total) by mouth daily. 90 tablet 2  . gabapentin (NEURONTIN) 300 MG capsule Take 300 mg by mouth 3 (three) times daily. (patient takes only 1 qhs)    . glimepiride (AMARYL) 4 MG tablet TAKE 1 TABLET (4 MG TOTAL) BY MOUTH 2 (TWO) TIMES DAILY. 180 tablet 99  . glucose blood test strip Use as instructed 100 each 0  . losartan (COZAAR) 50 MG tablet TAKE 1 TABLET BY MOUTH DAILY FOR BLOOD PRESSURE AND KIDNEY 90 tablet 2  . Magnesium 400 MG CAPS Take 1 capsule by mouth 2 (two) times daily.     . meloxicam (MOBIC) 15 MG tablet TAKE 1 TABLET EVERY DAY FOR PAIN AND INFLAMATION 90 tablet 1  . metFORMIN (GLUCOPHAGE-XR) 500 MG 24 hr tablet TAKE 2 TABLETS TWICE A DAY 180 tablet 1  . OVER THE COUNTER MEDICATION 150 mg at bedtime. CVS acid reduced 75 mg at bedtime    . rOPINIRole (REQUIP) 3 MG tablet Take 1 tablet (3 mg total) by mouth at bedtime. 120 tablet 5   No current facility-administered medications for this visit.      ___________________________________________________________________ Objective  Exam:  BP 152/70 mmHg  Pulse 88  Ht 5' 2.5" (1.588 m)  Wt 216 lb 12.8 oz (98.34 kg)  BMI 39.00 kg/m2   General: this is an obese elderly white woman, good muscle mass   Eyes: sclera anicteric, no redness  ENT: oral mucosa moist without lesions, no cervical or supraclavicular lymphadenopathy, good dentition  CV: RRR without murmur, S1/S2, no JVD, no peripheral edema  Resp: clear to auscultation bilaterally, normal RR and  effort noted  GI: soft, no tenderness, with active bowel sounds. No guarding or palpable organomegaly noted.  Skin; warm and dry, no rash or jaundice noted  Neuro: awake, alert and oriented x 3. Normal gross motor function and fluent speech  Labs:  CBC Latest Ref Rng 07/21/2015 04/14/2015 11/18/2014  WBC 4.0 - 10.5 K/uL 5.3 4.1 5.3  Hemoglobin 12.0 - 15.0 g/dL 8.3(L) 9.8(L) 11.6(L)  Hematocrit 36.0 - 46.0 % 28.8(L) 33.0(L) 36.7  Platelets 150 - 400 K/uL 259 276 247   mcv 75 Albumin 3.8 Had a ferritin = 5 in Jan  2011 B12 252 in Dec 2016   Assessment: Encounter Diagnoses  Name Primary?  Marland Kitchen Anemia,  iron deficiency Yes  . Diarrhea, unspecified type    It is not clear if these 2 issues are related, since this is a recurrence of anemia occurred several years ago but was of unclear cause. Even if not related, both issues require an endoscopic workup. I am wondering about microscopic colitis, a sprue-like condition from ARB therapy, rectal spasms/IBS like condition (despite the lack of abdominal pain), and less likely small bowel bacterial overgrowth   Plan:  He is encouraged to see her PCP later today for iron studies B12 level, and to get started on iron therapy. I expect she would then need her PCP to check a CBC 3-4 weeks later and communicate results to me.  While she does need an EGD and colonoscopy to look for the conditions noted above, I would like her hemoglobin at least 9.0 before an elective procedure.Respiratory risks of sedation are increased at her BMI and with sleep apnea.  Thank you for the courtesy of this consult.  Please call me with any questions or concerns.  Nelida Meuse III

## 2015-07-24 NOTE — Patient Instructions (Signed)
Anemia, Nonspecific Anemia is a condition in which the concentration of red blood cells or hemoglobin in the blood is below normal. Hemoglobin is a substance in red blood cells that carries oxygen to the tissues of the body. Anemia results in not enough oxygen reaching these tissues.  CAUSES  Common causes of anemia include:   Excessive bleeding. Bleeding may be internal or external. This includes excessive bleeding from periods (in women) or from the intestine.   Poor nutrition.   Chronic kidney, thyroid, and liver disease.  Bone marrow disorders that decrease red blood cell production.  Cancer and treatments for cancer.  HIV, AIDS, and their treatments.  Spleen problems that increase red blood cell destruction.  Blood disorders.  Excess destruction of red blood cells due to infection, medicines, and autoimmune disorders. SIGNS AND SYMPTOMS   Minor weakness.   Dizziness.   Headache.  Palpitations.   Shortness of breath, especially with exercise.   Paleness.  Cold sensitivity.  Indigestion.  Nausea.  Difficulty sleeping.  Difficulty concentrating. Symptoms may occur suddenly or they may develop slowly.  DIAGNOSIS  Additional blood tests are often needed. These help your health care provider determine the best treatment. Your health care provider will check your stool for blood and look for other causes of blood loss.  TREATMENT  Treatment varies depending on the cause of the anemia. Treatment can include:   Supplements of iron, vitamin B12, or folic acid.   Hormone medicines.   A blood transfusion. This may be needed if blood loss is severe.   Hospitalization. This may be needed if there is significant continual blood loss.   Dietary changes.  Spleen removal. HOME CARE INSTRUCTIONS Keep all follow-up appointments. It often takes many weeks to correct anemia, and having your health care provider check on your condition and your response to  treatment is very important. SEEK IMMEDIATE MEDICAL CARE IF:   You develop extreme weakness, shortness of breath, or chest pain.   You become dizzy or have trouble concentrating.  You develop heavy vaginal bleeding.   You develop a rash.   You have bloody or black, tarry stools.   You faint.   You vomit up blood.   You vomit repeatedly.   You have abdominal pain.  You have a fever or persistent symptoms for more than 2-3 days.   You have a fever and your symptoms suddenly get worse.   You are dehydrated.  MAKE SURE YOU:  Understand these instructions.  Will watch your condition.  Will get help right away if you are not doing well or get worse.   This information is not intended to replace advice given to you by your health care provider. Make sure you discuss any questions you have with your health care provider.   Document Released: 06/03/2004 Document Revised: 12/27/2012 Document Reviewed: 10/20/2012 Elsevier Interactive Patient Education 2016 [employer]  

## 2015-07-24 NOTE — Progress Notes (Signed)
Subjective:    Patient ID: Janet Choi, female    DOB: [birth year], 75 y.o.   MRN: [id number]  HPI  Patient presents back to the office for evaluation of anemia.  She was found to have a very low hemoglobin of 8.3 on last 3 month visit.  Baseline hemoglobin appears to be around 11-12.  She denies any melena or hematochezia.  She is consulting with Dr. Rosana Hoes about scheduling a colonoscopy but he will not do that until her hemoglobin was back up.  She reports that she has had issues with her hemoglobin dropping in the past.  She does not eat red meat at all.  She reports that she does eat cauliflower and brussel sprouts, but does not eat a lot of leafy green veggies.  She reports that sometimes salads cause her to have diarrhea.  She has had large drops in her iron previously.  She does have history of chronic kidney disease.  She has no other family history of blood diseases that she is aware of.   Review of Systems  Constitutional: Positive for fatigue. Negative for fever and chills.  Respiratory: Negative for chest tightness and shortness of breath.   Cardiovascular: Negative for chest pain, palpitations and leg swelling.  Gastrointestinal: Positive for diarrhea. Negative for abdominal pain, blood in stool and anal bleeding.  Neurological: Negative for dizziness and light-headedness.       Objective:   Physical Exam  Constitutional: She is oriented to person, place, and time. She appears well-developed and well-nourished. No distress.  HENT:  Head: Normocephalic.  Mouth/Throat: Oropharynx is clear and moist. No oropharyngeal exudate.  Eyes: Conjunctivae are normal. No scleral icterus.  Neck: Normal range of motion. Neck supple. No JVD present. No thyromegaly present.  Cardiovascular: Normal rate, regular rhythm, normal heart sounds and intact distal pulses.  Exam reveals no gallop and no friction rub.   No murmur heard. Pulmonary/Chest: Effort normal and breath sounds normal. No  respiratory distress. She has no wheezes. She has no rales. She exhibits no tenderness.  Abdominal: Soft. Bowel sounds are normal. She exhibits no distension and no mass. There is no tenderness. There is no rebound and no guarding.  Musculoskeletal: Normal range of motion.  Lymphadenopathy:    She has no cervical adenopathy.  Neurological: She is alert and oriented to person, place, and time.  Skin: Skin is warm and dry. She is not diaphoretic.  Psychiatric: She has a normal mood and affect. Her behavior is normal. Judgment and thought content normal.  Nursing note and vitals reviewed.   Filed Vitals:   07/24/15 1352  BP: 138/70  Pulse: 82  Temp: 98 F (36.7 C)  Resp: 18          Assessment & Plan:    1. Anemia, unspecified anemia type -possible coming from metformin or it also may be coming from possible slow GI Bleed. -she declines rectal exam here, but does have take home hemoccult test that she will bring at her 2 week visit. -start slow release iron twice daily and take with Vit C (OJ or lemonade) -discussed starting possible trulicity once severe anemia repleted.   - CBC with Differential/Platelet - Iron and TIBC - Vitamin B12 - Reticulocytes Count - Ferritin - Folate RBC  2. Hyperlipidemia -welchol samples 1 tablet BID  3. Uncontrolled type 2 diabetes mellitus with ketoacidosis without coma, without long-term current use of insulin ([place])  -discussed the use of trulicity. We will resolve acute anemia  first before starting new diabetes medication

## 2015-07-24 NOTE — Patient Instructions (Signed)
Please see your primary care doctor as scheduled today and have your iron and B12 levels checked.  I suspect you will need to be on iron tablets for 3-4 weeks before the blood count will be improved enough for you to undergo upper endoscopy and colonoscopy (to work up both diarrhea and anemia).

## 2015-07-25 ENCOUNTER — Other Ambulatory Visit: Payer: Self-pay | Admitting: Internal Medicine

## 2015-07-25 DIAGNOSIS — M4806 Spinal stenosis, lumbar region: Secondary | ICD-10-CM | POA: Diagnosis not present

## 2015-07-25 LAB — FOLATE RBC: RBC Folate: 630 ng/mL (ref >280)

## 2015-07-28 DIAGNOSIS — M5416 Radiculopathy, lumbar region: Secondary | ICD-10-CM | POA: Diagnosis not present

## 2015-07-28 DIAGNOSIS — M542 Cervicalgia: Secondary | ICD-10-CM | POA: Diagnosis not present

## 2015-07-30 DIAGNOSIS — G4733 Obstructive sleep apnea (adult) (pediatric): Secondary | ICD-10-CM | POA: Diagnosis not present

## 2015-08-05 ENCOUNTER — Observation Stay (HOSPITAL_COMMUNITY)
Admission: EM | Admit: 2015-08-05 | Discharge: 2015-08-07 | Disposition: A | Discharge status: Home / Self Care | Payer: PPO | Attending: Internal Medicine | Admitting: Internal Medicine

## 2015-08-05 ENCOUNTER — Emergency Department (HOSPITAL_COMMUNITY): Payer: PPO

## 2015-08-05 ENCOUNTER — Ambulatory Visit (INDEPENDENT_AMBULATORY_CARE_PROVIDER_SITE_OTHER): Payer: PPO | Admitting: Internal Medicine

## 2015-08-05 ENCOUNTER — Encounter: Payer: Self-pay | Admitting: Internal Medicine

## 2015-08-05 ENCOUNTER — Encounter (HOSPITAL_COMMUNITY): Payer: Self-pay | Admitting: Emergency Medicine

## 2015-08-05 VITALS — BP 120/72 | HR 88 | Temp 98.0°F | Resp 16 | Ht 62.5 in | Wt 215.0 lb

## 2015-08-05 DIAGNOSIS — Z79899 Other long term (current) drug therapy: Secondary | ICD-10-CM | POA: Diagnosis not present

## 2015-08-05 DIAGNOSIS — R0600 Dyspnea, unspecified: Secondary | ICD-10-CM | POA: Diagnosis not present

## 2015-08-05 DIAGNOSIS — I130 Hypertensive heart and chronic kidney disease with heart failure and stage 1 through stage 4 chronic kidney disease, or unspecified chronic kidney disease: Secondary | ICD-10-CM | POA: Diagnosis not present

## 2015-08-05 DIAGNOSIS — I5031 Acute diastolic (congestive) heart failure: Secondary | ICD-10-CM | POA: Diagnosis not present

## 2015-08-05 DIAGNOSIS — R05 Cough: Secondary | ICD-10-CM | POA: Insufficient documentation

## 2015-08-05 DIAGNOSIS — E1165 Type 2 diabetes mellitus with hyperglycemia: Secondary | ICD-10-CM | POA: Insufficient documentation

## 2015-08-05 DIAGNOSIS — R0902 Hypoxemia: Principal | ICD-10-CM | POA: Insufficient documentation

## 2015-08-05 DIAGNOSIS — D509 Iron deficiency anemia, unspecified: Secondary | ICD-10-CM | POA: Insufficient documentation

## 2015-08-05 DIAGNOSIS — M1990 Unspecified osteoarthritis, unspecified site: Secondary | ICD-10-CM | POA: Diagnosis not present

## 2015-08-05 DIAGNOSIS — G2581 Restless legs syndrome: Secondary | ICD-10-CM | POA: Diagnosis not present

## 2015-08-05 DIAGNOSIS — Z79891 Long term (current) use of opiate analgesic: Secondary | ICD-10-CM | POA: Insufficient documentation

## 2015-08-05 DIAGNOSIS — R079 Chest pain, unspecified: Secondary | ICD-10-CM | POA: Diagnosis not present

## 2015-08-05 DIAGNOSIS — Z96653 Presence of artificial knee joint, bilateral: Secondary | ICD-10-CM | POA: Insufficient documentation

## 2015-08-05 DIAGNOSIS — Z87891 Personal history of nicotine dependence: Secondary | ICD-10-CM | POA: Insufficient documentation

## 2015-08-05 DIAGNOSIS — N183 Chronic kidney disease, stage 3 (moderate): Secondary | ICD-10-CM | POA: Diagnosis not present

## 2015-08-05 DIAGNOSIS — E1169 Type 2 diabetes mellitus with other specified complication: Secondary | ICD-10-CM | POA: Diagnosis present

## 2015-08-05 DIAGNOSIS — E669 Obesity, unspecified: Secondary | ICD-10-CM | POA: Insufficient documentation

## 2015-08-05 DIAGNOSIS — E785 Hyperlipidemia, unspecified: Secondary | ICD-10-CM | POA: Insufficient documentation

## 2015-08-05 DIAGNOSIS — E559 Vitamin D deficiency, unspecified: Secondary | ICD-10-CM | POA: Diagnosis not present

## 2015-08-05 DIAGNOSIS — E1122 Type 2 diabetes mellitus with diabetic chronic kidney disease: Secondary | ICD-10-CM | POA: Insufficient documentation

## 2015-08-05 DIAGNOSIS — E538 Deficiency of other specified B group vitamins: Secondary | ICD-10-CM | POA: Insufficient documentation

## 2015-08-05 DIAGNOSIS — R0602 Shortness of breath: Secondary | ICD-10-CM | POA: Diagnosis not present

## 2015-08-05 DIAGNOSIS — F419 Anxiety disorder, unspecified: Secondary | ICD-10-CM | POA: Diagnosis not present

## 2015-08-05 DIAGNOSIS — G4733 Obstructive sleep apnea (adult) (pediatric): Secondary | ICD-10-CM | POA: Diagnosis not present

## 2015-08-05 DIAGNOSIS — Z6839 Body mass index (BMI) 39.0-39.9, adult: Secondary | ICD-10-CM | POA: Diagnosis not present

## 2015-08-05 DIAGNOSIS — I10 Essential (primary) hypertension: Secondary | ICD-10-CM | POA: Diagnosis present

## 2015-08-05 DIAGNOSIS — Z7984 Long term (current) use of oral hypoglycemic drugs: Secondary | ICD-10-CM | POA: Insufficient documentation

## 2015-08-05 LAB — CBC
HEMATOCRIT: 30.8 % — AB (ref 36.0–46.0)
HEMOGLOBIN: 9.1 g/dL — AB (ref 12.0–15.0)
MCH: 22.8 pg — ABNORMAL LOW (ref 26.0–34.0)
MCHC: 29.5 g/dL — AB (ref 30.0–36.0)
MCV: 77.2 fL — ABNORMAL LOW (ref 78.0–100.0)
Platelets: 273 10*3/uL (ref 150–400)
RBC: 3.99 MIL/uL (ref 3.87–5.11)
RDW: 17.6 % — ABNORMAL HIGH (ref 11.5–15.5)
WBC: 6 10*3/uL (ref 4.0–10.5)

## 2015-08-05 LAB — D-DIMER, QUANTITATIVE (NOT AT ARMC): D DIMER QUANT: 0.59 ug{FEU}/mL — AB (ref 0.00–0.50)

## 2015-08-05 LAB — BASIC METABOLIC PANEL
ANION GAP: 11 (ref 5–15)
BUN: 22 mg/dL — ABNORMAL HIGH (ref 6–20)
CALCIUM: 9.7 mg/dL (ref 8.9–10.3)
CO2: 25 mmol/L (ref 22–32)
Chloride: 103 mmol/L (ref 101–111)
Creatinine, Ser: 1.27 mg/dL — ABNORMAL HIGH (ref 0.44–1.00)
GFR, EST AFRICAN AMERICAN: 47 mL/min — AB (ref >60)
GFR, EST NON AFRICAN AMERICAN: 41 mL/min — AB (ref >60)
GLUCOSE: 247 mg/dL — AB (ref 65–99)
POTASSIUM: 4.7 mmol/L (ref 3.5–5.1)
SODIUM: 139 mmol/L (ref 135–145)

## 2015-08-05 LAB — I-STAT TROPONIN, ED: TROPONIN I, POC: 0 ng/mL (ref 0.00–0.08)

## 2015-08-05 LAB — BRAIN NATRIURETIC PEPTIDE: B Natriuretic Peptide: 57.4 pg/mL (ref 0.0–100.0)

## 2015-08-05 MED ORDER — IOPAMIDOL (ISOVUE-370) INJECTION 76%
100.0000 mL | Freq: Once | INTRAVENOUS | Status: AC | PRN
  Administered 2015-08-05: 80 mL via INTRAVENOUS

## 2015-08-05 NOTE — ED Provider Notes (Signed)
CSN: [id number]     Arrival date & time 08/05/15  1604 History   First MD Initiated Contact with Patient 08/05/15 2008     Chief Complaint  Patient presents with  . Shortness of Breath  . Chest Pain   HPI  Ms. Becerril is a 75 year old female with PMHx of DM, HTN, HLD, OSA and IDA presenting with chest pain and shortness of breath. Patient reports worsening shortness of breath over the past week. She states that she becomes severely short of breath with walking for about 1 minute and is unable to climb stairs. She is also complaining of intermittent chest tightness and pressure. She states that it occurs at rest and is not necessarily present with her shortness of breath. Shortness of breath is resolved at rest. All she reports a chronic, hacking cough. She has a 20 year history of pain. She began smoking in her teenage years and quit in the mid 1980s. She was recently diagnosed with iron deficiency anemia with a hemoglobin of 8.3. She was started on iron supplements she reports compliance with. She was seen at her PCPs office today for this complaint and directed to come to the emergency department. Her PCP is concerned that she is having symptomatic anemia. She denies dark, tarry stools but does note they have been darker than usual since starting iron. She has all her symptoms today. Denies fevers, headaches, dizziness, syncope, URI symptoms or abdominal pain, nausea, vomiting or lower extremity swelling. Denies cardiac history.  Past Medical History  Diagnosis Date  . Diabetes mellitus without complication ([place])   . Hypertension   . Hyperlipidemia   . OSA (obstructive sleep apnea)   . DJD (degenerative joint disease)   . RLS (restless legs syndrome)   . Obesity   . Vitamin D deficiency   . Anxiety   . IBS (irritable bowel syndrome)    Past Surgical History  Procedure Laterality Date  . Total knee arthroplasty Bilateral   . Cataract extraction Bilateral   . Rotator cuff repair Bilateral    . Lumbar disc surgery      x 2  . Appendectomy     Family History  Problem Relation Age of Onset  . Heart disease Mother   . Colon cancer Neg Hx   . Colon polyps Neg Hx   . Esophageal cancer Neg Hx   . Pancreatic cancer Neg Hx   . Stomach cancer Neg Hx   . Kidney disease Father   . Liver disease Neg Hx   . Diabetes Neg Hx    Social History  Substance Use Topics  . Smoking status: Former Smoker    Quit date: 05/11/1983  . Smokeless tobacco: Never Used  . Alcohol Use: No   OB History    No data available     Review of Systems  All other systems reviewed and are negative.     Allergies  Aspirin; Crestor; Lipitor; Lyrica; Minocycline hcl; Oruvail; Tricor; Vasotec; Victoza; and Zinc  Home Medications   Prior to Admission medications   Medication Sig Start Date End Date Taking? Authorizing Provider  Cholecalciferol (VITAMIN D) 2000 UNITS tablet Take 10,000 Units by mouth daily.    Yes Historical Provider, MD  clonazePAM (KLONOPIN) 1 MG tablet TAKE 1 TABLET THREE TIMES A DAY 07/21/15  Yes Courtney Forcucci, PA-C  colesevelam (WELCHOL) 625 MG tablet Take 625 mg by mouth 2 (two) times daily with a meal.   Yes Historical Provider, MD  Ferrous  Sulfate 27 MG TABS Take 27 mg by mouth 2 (two) times daily.   Yes Historical Provider, MD  FLECTOR 1.3 % PTCH APPLY 1 PATCH EVERY 12 HOURS AS NEEDED FOR PAIN THEN OFF 12 HOURS 07/11/15  Yes Historical Provider, MD  FLUoxetine (PROZAC) 40 MG capsule TAKE 1 CAPSULE BY MOUTH DAILY. 06/28/15  Yes [name], MD  furosemide (LASIX) 40 MG tablet Take 0.5 tablets (20 mg total) by mouth daily. 06/01/13  Yes [name], MD  gabapentin (NEURONTIN) 300 MG capsule Take 300 mg by mouth 3 (three) times daily.    Yes Historical Provider, MD  glimepiride (AMARYL) 4 MG tablet TAKE 1 TABLET (4 MG TOTAL) BY MOUTH 2 (TWO) TIMES DAILY. Patient taking differently: TAKE 1 TABLET (4 MG TOTAL) BY MOUTH 2 (TWO) TIMES AT [facility] AND BEDTIME 06/16/14  Yes [name], MD  HYDROcodone-acetaminophen (NORCO/VICODIN) 5-325 MG tablet Take 1 tablet by mouth every 6 (six) hours as needed. for pain 07/30/15  Yes Historical Provider, MD  losartan (COZAAR) 50 MG tablet TAKE 1 TABLET BY MOUTH DAILY FOR BLOOD PRESSURE AND KIDNEY 04/28/15  Yes [name], MD  Magnesium 400 MG CAPS Take 1 capsule by mouth 2 (two) times daily.    Yes Historical Provider, MD  meloxicam (MOBIC) 15 MG tablet TAKE 1 TABLET EVERY DAY FOR PAIN AND INFLAMATION 05/20/15  Yes [name], MD  metFORMIN (GLUCOPHAGE-XR) 500 MG 24 hr tablet TAKE 2 TABLETS TWICE A DAY 07/25/15  Yes Courtney Forcucci, PA-C  OVER THE COUNTER MEDICATION 150 mg at bedtime. CVS acid reduced 75 mg at bedtime   Yes Historical Provider, MD  rOPINIRole (REQUIP) 3 MG tablet Take 1 tablet (3 mg total) by mouth at bedtime. 06/18/15 06/18/16 Yes Courtney Forcucci, PA-C  glucose blood test strip Use as instructed 07/21/15   Courtney Forcucci, PA-C   BP 140/70 mmHg  Pulse 73  Temp(Src) 98.1 F (36.7 C) (Oral)  Resp 19  SpO2 96% Physical Exam  Constitutional: She appears well-developed and well-nourished. No distress.  Chronically ill-appearing  HENT:  Head: Normocephalic and atraumatic.  Mouth/Throat: Oropharynx is clear and moist.  Eyes: Conjunctivae are normal. Right eye exhibits no discharge. Left eye exhibits no discharge. No scleral icterus.  Neck: Normal range of motion.  Cardiovascular: Normal rate, regular rhythm, normal heart sounds and intact distal pulses.   No peripheral edema  Pulmonary/Chest: Effort normal. No respiratory distress. She has no wheezes. She has no rales.  Slightly decreased breath sounds bilateral bases.  Abdominal: Soft. Bowel sounds are normal. She exhibits no distension. There is no tenderness.  Musculoskeletal: Normal range of motion.  Neurological: She is alert. Coordination normal.  Skin: Skin is warm and dry.  Psychiatric: She has a normal mood and affect. Her behavior is  normal.  Nursing note and vitals reviewed.   ED Course  Procedures (including critical care time) Labs Review Labs Reviewed  BASIC METABOLIC PANEL - Abnormal; Notable for the following:    Glucose, Bld 247 (*)    BUN 22 (*)    Creatinine, Ser 1.27 (*)    GFR calc non Af Amer 41 (*)    GFR calc Af Amer 47 (*)    All other components within normal limits  CBC - Abnormal; Notable for the following:    Hemoglobin 9.1 (*)    HCT 30.8 (*)    MCV 77.2 (*)    MCH 22.8 (*)    MCHC 29.5 (*)    RDW 17.6 (*)  All other components within normal limits  D-DIMER, QUANTITATIVE (NOT AT [facility]) - Abnormal; Notable for the following:    D-Dimer, Quant 0.59 (*)    All other components within normal limits  BRAIN NATRIURETIC PEPTIDE  I-STAT TROPOININ, ED  POC OCCULT BLOOD, ED    Imaging Review Dg Chest 2 View  08/05/2015  CLINICAL DATA:  Right side chest pain.  Shortness of Breath EXAM: CHEST  2 VIEW COMPARISON:  01/14/2011 FINDINGS: Heart is borderline in size. No confluent airspace opacities or effusions. No acute bony abnormality. IMPRESSION: No active cardiopulmonary disease. Electronically Signed   By: Rolm Baptise M.D.   On: 08/05/2015 17:01   Ct Angio Chest Pe W/cm &/or Wo Cm  08/05/2015  CLINICAL DATA:  Acute onset of worsening shortness of breath and generalized chest tightness. Right-sided chest pain. Decreased hemoglobin. Initial encounter. EXAM: CT ANGIOGRAPHY CHEST WITH CONTRAST TECHNIQUE: Multidetector CT imaging of the chest was performed using the standard protocol during bolus administration of intravenous contrast. Multiplanar CT image reconstructions and MIPs were obtained to evaluate the vascular anatomy. CONTRAST:  80 mL of Isovue 370 IV contrast COMPARISON:  Chest radiograph performed earlier today at 4:48 p.m. FINDINGS: There is no evidence of pulmonary embolus. The lungs are essentially clear bilaterally. There is no evidence of significant focal consolidation, pleural effusion  or pneumothorax. No masses are identified; no abnormal focal contrast enhancement is seen. A 1.2 cm precarinal node is noted. Visualized mediastinal nodes are otherwise unremarkable. No pericardial effusion is seen. Scattered calcification is noted along the proximal great vessels. No axillary lymphadenopathy is seen. The thyroid gland is unremarkable in appearance. The visualized portions of the liver and spleen are unremarkable. A small hiatal hernia is noted. No acute osseous abnormalities are seen. There is mild grade 1 anterolisthesis at multiple levels along the upper to mid thoracic spine. Review of the MIP images confirms the above findings. IMPRESSION: 1. No evidence of pulmonary embolus. 2. Lungs clear bilaterally. 3. 1.2 cm precarinal node seen, of uncertain significance. 4. Small hiatal hernia noted. Electronically Signed   By: Garald Balding M.D.   On: 08/05/2015 23:47   I have personally reviewed and evaluated these images and lab results as part of my medical decision-making.   EKG Interpretation None      MDM   Final diagnoses:  Hypoxia   75 year old female presenting with shortness of breath and chest pain 1 week. Shortness of breath is exertional. Chest pain is intermittent with no exertional exacerbation. No history of COPD, CHF or other pulmonary pathology. She does admit to a significant smoking history though she quit in the mid 1980s. Patient noted to the hypoxic to 88%. No history of oxygen requirement. Chronically ill appearing, lungs clear auscultation bilaterally with slightly diminished breath sounds in the bases. Heart regular rate and rhythm. Remaining exam benign. Hemoglobin 9.1 which appears improved from her baseline. Creatinine 1.27 which is at her baseline. Troponin negative with a nonischemic EKG. BNP 58. Id. her slightly elevated to 0.59. CT angio ordered due to hypoxemia. No sign of PE. Consulted hospitalist for admission for hypoxia with unknown source. Dr. Tamala Julian  accepting to [place].   Lahoma Crocker Amanat Hackel, PA-C 08/06/15 0106  Davonna Belling, MD 08/07/15 [phone]

## 2015-08-05 NOTE — Progress Notes (Signed)
Subjective:    Patient ID: Janet Choi, female    DOB: Apr 19, 1941, 75 y.o.   MRN: [id number]  Shortness of Breath Associated symptoms include chest pain. Pertinent negatives include no abdominal pain, leg swelling, vomiting or wheezing.  Patient presents to the office for worsening shortness of breath x 1 week.  Patient was recently diagnosed with severe iron deficiency anemia and has been taking iron twice daily for the last week.  Today she feels like she is short of breath at rest and is having some chest pressure and tightness at rest.  She has been also having increased restless leg syndrome and some neck pain today.  Last hemoglobin in the office was 8.3.  She also have very low iron and high levels of TIBC.  Ferritin was also low.  She has had this happen in the past.  She is also a very non-compliant diabetic and has been having increase in her blood pressure.  She has had no blood in her stool or black colored stools, although she does admit that stools have been darker since she started taking iron.  Gastroentoerology will not do a colonoscopy until hemoglobin has increased.  She has never seen hematology.   Review of Systems  Constitutional: Positive for activity change and fatigue. Negative for chills.  Respiratory: Positive for chest tightness and shortness of breath. Negative for apnea, cough, choking, wheezing and stridor.   Cardiovascular: Positive for chest pain and palpitations (sensations of racing heart). Negative for leg swelling.  Gastrointestinal: Negative for nausea, vomiting, abdominal pain, diarrhea, constipation, blood in stool and anal bleeding.       Objective:   Physical Exam  Constitutional: She is oriented to person, place, and time. No distress.  Obese, pale and tired appearing  HENT:  Head: Normocephalic.  Mouth/Throat: Oropharynx is clear and moist. No oropharyngeal exudate.  MM pale and moist  Eyes: Conjunctivae are normal.  Pale sclera  Neck: Normal  range of motion. Neck supple. No JVD present. No thyromegaly present.  Cardiovascular: Normal rate, regular rhythm, normal heart sounds and intact distal pulses.  Exam reveals no gallop and no friction rub.   No murmur heard. Pulmonary/Chest: Effort normal. No respiratory distress. She has wheezes (occasional wheeze at the bases bilaterally). She has no rales. She exhibits no tenderness.  Mildly increased work of breathing with  conversation  Abdominal: Soft. Bowel sounds are normal. She exhibits no distension and no mass. There is no tenderness. There is no rebound and no guarding.  Musculoskeletal: Normal range of motion.  Lymphadenopathy:    She has no cervical adenopathy.  Neurological: She is alert and oriented to person, place, and time.  Skin: Skin is warm and dry. She is not diaphoretic.  Psychiatric: She has a normal mood and affect. Her behavior is normal. Judgment and thought content normal.  Nursing note and vitals reviewed.   Filed Vitals:   08/05/15 1508  BP: 120/72  Pulse: 88  Temp: 98 F (36.7 C)  Resp: 16         Assessment & Plan:    1. Iron deficiency anemia -Patient presents to the office for iron deficiency anemia with shortness of breath and chest pain.  Given such low hemoglobin at office and now symptomatic anemia with chest pain and shortness of breath do feel that patient needs further evaluation and possible transfusion if appropriate.  She is high risk for cardiac issues given very non-compliant diabetic and also Hyperlipidemia and Hypertension.    -  recommend CBC, Troponin, EKG, CXR, CMET, hemoccult.

## 2015-08-05 NOTE — ED Notes (Signed)
Pt states last week began feeling more SOB. Increased to chest tightness and worsening SOB over the last week. Has problems with low HgB, and says she recently got put on a medication to improve it. SOB at rest. Chest pain to right side of chest, no previous history of blood clots.

## 2015-08-05 NOTE — ED Notes (Signed)
Did not attempt to ambulate pt is hall  Pt's O2 sat 87-88% laying in bed with head elevated  Oxygen applied at 2 liters/min via [place]

## 2015-08-06 ENCOUNTER — Observation Stay (HOSPITAL_BASED_OUTPATIENT_CLINIC_OR_DEPARTMENT_OTHER): Payer: PPO

## 2015-08-06 DIAGNOSIS — E1122 Type 2 diabetes mellitus with diabetic chronic kidney disease: Secondary | ICD-10-CM

## 2015-08-06 DIAGNOSIS — R079 Chest pain, unspecified: Secondary | ICD-10-CM | POA: Diagnosis not present

## 2015-08-06 DIAGNOSIS — I10 Essential (primary) hypertension: Secondary | ICD-10-CM | POA: Diagnosis not present

## 2015-08-06 DIAGNOSIS — R0902 Hypoxemia: Secondary | ICD-10-CM | POA: Diagnosis not present

## 2015-08-06 DIAGNOSIS — N183 Chronic kidney disease, stage 3 (moderate): Secondary | ICD-10-CM

## 2015-08-06 DIAGNOSIS — R0602 Shortness of breath: Secondary | ICD-10-CM | POA: Diagnosis not present

## 2015-08-06 DIAGNOSIS — R0600 Dyspnea, unspecified: Secondary | ICD-10-CM | POA: Diagnosis not present

## 2015-08-06 LAB — CBC WITH DIFFERENTIAL/PLATELET
Basophils Absolute: 0 10*3/uL (ref 0.0–0.1)
Basophils Relative: 1 %
EOS PCT: 6 %
Eosinophils Absolute: 0.3 10*3/uL (ref 0.0–0.7)
HCT: 26.1 % — ABNORMAL LOW (ref 36.0–46.0)
HEMOGLOBIN: 7.6 g/dL — AB (ref 12.0–15.0)
LYMPHS ABS: 1.4 10*3/uL (ref 0.7–4.0)
LYMPHS PCT: 33 %
MCH: 22 pg — AB (ref 26.0–34.0)
MCHC: 29.1 g/dL — AB (ref 30.0–36.0)
MCV: 75.7 fL — AB (ref 78.0–100.0)
MONOS PCT: 7 %
Monocytes Absolute: 0.3 10*3/uL (ref 0.1–1.0)
Neutro Abs: 2.4 10*3/uL (ref 1.7–7.7)
Neutrophils Relative %: 53 %
PLATELETS: 192 10*3/uL (ref 150–400)
RBC: 3.45 MIL/uL — AB (ref 3.87–5.11)
RDW: 17.5 % — ABNORMAL HIGH (ref 11.5–15.5)
WBC: 4.4 10*3/uL (ref 4.0–10.5)

## 2015-08-06 LAB — TROPONIN I
Troponin I: <0.03 ng/mL (ref <0.031)
Troponin I: <0.03 ng/mL (ref <0.031)
Troponin I: <0.03 ng/mL (ref <0.031)

## 2015-08-06 LAB — GLUCOSE, CAPILLARY
GLUCOSE-CAPILLARY: 204 mg/dL — AB (ref 65–99)
GLUCOSE-CAPILLARY: 267 mg/dL — AB (ref 65–99)
Glucose-Capillary: 164 mg/dL — ABNORMAL HIGH (ref 65–99)
Glucose-Capillary: 159 mg/dL — ABNORMAL HIGH (ref 65–99)
Glucose-Capillary: 220 mg/dL — ABNORMAL HIGH (ref 65–99)
Glucose-Capillary: 310 mg/dL — ABNORMAL HIGH (ref 65–99)

## 2015-08-06 LAB — BASIC METABOLIC PANEL
Anion gap: 10 (ref 5–15)
BUN: 21 mg/dL — AB (ref 6–20)
CO2: 24 mmol/L (ref 22–32)
Calcium: 8.9 mg/dL (ref 8.9–10.3)
Chloride: 102 mmol/L (ref 101–111)
Creatinine, Ser: 1.22 mg/dL — ABNORMAL HIGH (ref 0.44–1.00)
GFR calc Af Amer: 49 mL/min — ABNORMAL LOW (ref >60)
GFR, EST NON AFRICAN AMERICAN: 43 mL/min — AB (ref >60)
Glucose, Bld: 169 mg/dL — ABNORMAL HIGH (ref 65–99)
POTASSIUM: 3.6 mmol/L (ref 3.5–5.1)
SODIUM: 136 mmol/L (ref 135–145)

## 2015-08-06 LAB — ECHOCARDIOGRAM COMPLETE
Height: 62 in
WEIGHTICAEL: 3443.2 [oz_av]

## 2015-08-06 LAB — POC OCCULT BLOOD, ED: FECAL OCCULT BLD: NEGATIVE

## 2015-08-06 MED ORDER — COLESEVELAM HCL 625 MG PO TABS
625.0000 mg | ORAL_TABLET | Freq: Two times a day (BID) | ORAL | Status: DC
  Administered 2015-08-06 – 2015-08-07 (×3): 625 mg via ORAL
  Filled 2015-08-06 (×5): qty 1

## 2015-08-06 MED ORDER — FUROSEMIDE 20 MG PO TABS
20.0000 mg | ORAL_TABLET | Freq: Every day | ORAL | Status: DC
Start: 2015-08-07 — End: 2015-08-07
  Administered 2015-08-07: 20 mg via ORAL
  Filled 2015-08-06: qty 1

## 2015-08-06 MED ORDER — CLONAZEPAM 1 MG PO TABS
1.0000 mg | ORAL_TABLET | Freq: Three times a day (TID) | ORAL | Status: DC | PRN
  Administered 2015-08-06: 1 mg via ORAL
  Filled 2015-08-06: qty 1

## 2015-08-06 MED ORDER — METFORMIN HCL ER 500 MG PO TB24: 1000.0000 mg | ORAL_TABLET | Freq: Two times a day (BID) | ORAL | Status: DC

## 2015-08-06 MED ORDER — ACETAMINOPHEN 325 MG PO TABS
650.0000 mg | ORAL_TABLET | ORAL | Status: DC | PRN
  Administered 2015-08-06: 650 mg via ORAL
  Filled 2015-08-06 (×2): qty 2

## 2015-08-06 MED ORDER — ONDANSETRON HCL 4 MG/2ML IJ SOLN: 4.0000 mg | Freq: Four times a day (QID) | INTRAMUSCULAR | Status: DC | PRN

## 2015-08-06 MED ORDER — GABAPENTIN 300 MG PO CAPS
300.0000 mg | ORAL_CAPSULE | Freq: Three times a day (TID) | ORAL | Status: DC
  Administered 2015-08-06 – 2015-08-07 (×4): 300 mg via ORAL
  Filled 2015-08-06 (×6): qty 1

## 2015-08-06 MED ORDER — LOSARTAN POTASSIUM 50 MG PO TABS
50.0000 mg | ORAL_TABLET | Freq: Every day | ORAL | Status: DC
  Administered 2015-08-06 – 2015-08-07 (×2): 50 mg via ORAL
  Filled 2015-08-06 (×2): qty 1

## 2015-08-06 MED ORDER — DICLOFENAC EPOLAMINE 1.3 % TD PTCH
1.0000 | MEDICATED_PATCH | Freq: Two times a day (BID) | TRANSDERMAL | Status: DC
  Administered 2015-08-06 – 2015-08-07 (×4): 1 via TRANSDERMAL
  Filled 2015-08-06 (×8): qty 1

## 2015-08-06 MED ORDER — FLUOXETINE HCL 20 MG PO CAPS
40.0000 mg | ORAL_CAPSULE | Freq: Every day | ORAL | Status: DC
  Administered 2015-08-06 – 2015-08-07 (×2): 40 mg via ORAL
  Filled 2015-08-06 (×2): qty 2

## 2015-08-06 MED ORDER — HYDROCODONE-ACETAMINOPHEN 5-325 MG PO TABS
1.0000 | ORAL_TABLET | Freq: Four times a day (QID) | ORAL | Status: DC | PRN
  Administered 2015-08-06 (×2): 1 via ORAL
  Filled 2015-08-06 (×2): qty 1

## 2015-08-06 MED ORDER — GI COCKTAIL ~~LOC~~
30.0000 mL | Freq: Four times a day (QID) | ORAL | Status: DC | PRN
  Filled 2015-08-06: qty 30

## 2015-08-06 MED ORDER — CYANOCOBALAMIN 1000 MCG/ML IJ SOLN
1000.0000 ug | INTRAMUSCULAR | Status: DC
  Administered 2015-08-06 – 2015-08-07 (×2): 1000 ug via INTRAMUSCULAR
  Filled 2015-08-06 (×4): qty 1

## 2015-08-06 MED ORDER — FERROUS SULFATE 325 (65 FE) MG PO TABS
325.0000 mg | ORAL_TABLET | Freq: Two times a day (BID) | ORAL | Status: DC
  Administered 2015-08-06 – 2015-08-07 (×3): 325 mg via ORAL
  Filled 2015-08-06 (×4): qty 1

## 2015-08-06 MED ORDER — MAGNESIUM OXIDE 400 (241.3 MG) MG PO TABS
400.0000 mg | ORAL_TABLET | Freq: Two times a day (BID) | ORAL | Status: DC
  Administered 2015-08-06 – 2015-08-07 (×3): 400 mg via ORAL
  Filled 2015-08-06 (×4): qty 1

## 2015-08-06 MED ORDER — SODIUM CHLORIDE 0.9 % IV SOLN
510.0000 mg | Freq: Once | INTRAVENOUS | Status: AC
  Administered 2015-08-06: 510 mg via INTRAVENOUS
  Filled 2015-08-06: qty 17

## 2015-08-06 MED ORDER — GLIMEPIRIDE 4 MG PO TABS
4.0000 mg | ORAL_TABLET | Freq: Two times a day (BID) | ORAL | Status: DC
Start: 2015-08-06 — End: 2015-08-06

## 2015-08-06 MED ORDER — FUROSEMIDE 40 MG PO TABS: 20.0000 mg | ORAL_TABLET | Freq: Every day | ORAL | Status: DC

## 2015-08-06 MED ORDER — INSULIN ASPART 100 UNIT/ML ~~LOC~~ SOLN
0.0000 [IU] | Freq: Three times a day (TID) | SUBCUTANEOUS | Status: DC
  Administered 2015-08-06: 5 [IU] via SUBCUTANEOUS
  Administered 2015-08-06 – 2015-08-07 (×3): 8 [IU] via SUBCUTANEOUS

## 2015-08-06 MED ORDER — INSULIN ASPART 100 UNIT/ML ~~LOC~~ SOLN
0.0000 [IU] | SUBCUTANEOUS | Status: DC
  Administered 2015-08-06: 3 [IU] via SUBCUTANEOUS
  Administered 2015-08-06 (×2): 2 [IU] via SUBCUTANEOUS

## 2015-08-06 MED ORDER — ENOXAPARIN SODIUM 40 MG/0.4ML ~~LOC~~ SOLN
40.0000 mg | SUBCUTANEOUS | Status: DC
  Administered 2015-08-06 – 2015-08-07 (×2): 40 mg via SUBCUTANEOUS
  Filled 2015-08-06 (×2): qty 0.4

## 2015-08-06 MED ORDER — ALBUTEROL SULFATE (2.5 MG/3ML) 0.083% IN NEBU: 2.5000 mg | INHALATION_SOLUTION | RESPIRATORY_TRACT | Status: AC | PRN

## 2015-08-06 MED ORDER — ROPINIROLE HCL 1 MG PO TABS
3.0000 mg | ORAL_TABLET | Freq: Every day | ORAL | Status: DC
  Administered 2015-08-06 (×2): 3 mg via ORAL
  Filled 2015-08-06 (×3): qty 3

## 2015-08-06 MED ORDER — SODIUM CHLORIDE 0.9 % IV SOLN
INTRAVENOUS | Status: AC
  Administered 2015-08-06: 02:00:00 via INTRAVENOUS

## 2015-08-06 NOTE — Care Management Obs Status (Signed)
[facility] NOTIFICATION   Patient Details  Name: Janet Choi MRN: [id number] Date of Birth: 1940/12/15   Medicare Observation Status Notification Given:     MOON and C44 given and signed by pt; copy given to pt; original given to CMA to be scanned into pt's medical record. Dellie Catholic, RN 08/06/2015, 2:13 PM

## 2015-08-06 NOTE — Progress Notes (Signed)
*  PRELIMINARY RESULTS* Echocardiogram 2D Echocardiogram has been performed.  Leavy Cella 08/06/2015, 12:12 PM

## 2015-08-06 NOTE — H&P (Addendum)
[facility] History and Physical  Janet Choi [id number] DOB: [birth year] DOA: 08/05/2015  Referring physician: ED PCP: Alesia Richards, MD   Chief Complaint: Shortness of breath and chest pain  HPI:  Ms. Bergstresser is a 75 year old female with a past medical history significant for diabetes mellitus, HTN, HLD, OSA, and iron deficiency anemia; who presented with complaints of worsening shortness of breath over the last week. She complains of intermittent chest tightness and a pressure-like feeling. Shortness of breath symptoms worsen with exertion. Patient complains of not being able to walk across the room without feeling very winded. She reports a chronic hacking like cough. She reports being recently diagnosed with iron deficiency anemia for which she has been on iron supplementation. Hemoglobin had dropped as low as 8.3 she did not require any transfusions. She reports that stools have been dark, but that's since she spent taking the iron supplements. She gone to see her primary care provider with complaints of shortness of breath and her primary care provider had erected her to the emergency department for concerns of the patient suffering from symptomatic anemia.  Upon admission to the emergency department she was evaluated and seen have a elevated d-dimer for which a CT with PE protocol was performed showed no signs of a pulmonary embolus. O2 sats 87- 88% laying in the bed with head elevated for which 2 L nasal cannula oxygen was applied and an O2 sats improved to greater than 92%. Patient's hemoglobin was seen to be slightly improved to 9.1, previously 8.5 12 days ago. BNP was within normal limits initial troponins negative. 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    Review of Systems  Constitutional: Positive for malaise/fatigue. Negative for fever and chills.  HENT: Negative for hearing loss and tinnitus.   Eyes: Negative for double vision and photophobia.   Respiratory: Positive for shortness of breath.   Cardiovascular: Positive for chest pain and palpitations. Negative for leg swelling.  Gastrointestinal: Negative for nausea, vomiting and abdominal pain.  Genitourinary: Negative for urgency and frequency.  Musculoskeletal: Positive for back pain and joint pain.  Skin: Negative for itching and rash.  Neurological: Negative for seizures and loss of consciousness.  Endo/Heme/Allergies: Negative for environmental allergies. Does not bruise/bleed easily.     Past Medical History  Diagnosis Date  . Diabetes mellitus without complication ([place])   . Hypertension   . Hyperlipidemia   . OSA (obstructive sleep apnea)   . DJD (degenerative joint disease)   . RLS (restless legs syndrome)   . Obesity   . Vitamin D deficiency   . Anxiety   . IBS (irritable bowel syndrome)      Past Surgical History  Procedure Laterality Date  . Total knee arthroplasty Bilateral   . Cataract extraction Bilateral   . Rotator cuff repair Bilateral   . Lumbar disc surgery      x 2  . Appendectomy        Social History:  reports that she quit smoking about 32 years ago. She has never used smokeless tobacco. She reports that she does not drink alcohol or use illicit drugs. Where does patient live--home  Can patient participate in ADLs?Yes  Allergies  Allergen Reactions  . Aspirin     Large doses per patient  . Crestor [Rosuvastatin]     Leg crams  . Lipitor [Atorvastatin]     Leg crams  . Lyrica [Pregabalin] Other (See Comments)    Blurred vision  . Minocycline  Hcl     REACTION: Reaction not known  . Oruvail [Ketoprofen]   . Tricor [Fenofibrate]   . Vasotec [Enalaprilat]   . Victoza [Liraglutide]   . Zinc     Family History  Problem Relation Age of Onset  . Heart disease Mother   . Colon cancer Neg Hx   . Colon polyps Neg Hx   . Esophageal cancer Neg Hx   . Pancreatic cancer Neg Hx   . Stomach cancer Neg Hx   . Kidney disease Father    . Liver disease Neg Hx   . Diabetes Neg Hx         Prior to Admission medications   Medication Sig Start Date End Date Taking? Authorizing Provider  Cholecalciferol (VITAMIN D) 2000 UNITS tablet Take 10,000 Units by mouth daily.    Yes Historical Provider, MD  clonazePAM (KLONOPIN) 1 MG tablet TAKE 1 TABLET THREE TIMES A DAY 07/21/15  Yes Courtney Forcucci, PA-C  colesevelam (WELCHOL) 625 MG tablet Take 625 mg by mouth 2 (two) times daily with a meal.   Yes Historical Provider, MD  Ferrous Sulfate 27 MG TABS Take 27 mg by mouth 2 (two) times daily.   Yes Historical Provider, MD  FLECTOR 1.3 % PTCH APPLY 1 PATCH EVERY 12 HOURS AS NEEDED FOR PAIN THEN OFF 12 HOURS 07/11/15  Yes Historical Provider, MD  FLUoxetine (PROZAC) 40 MG capsule TAKE 1 CAPSULE BY MOUTH DAILY. 06/28/15  Yes [name], MD  furosemide (LASIX) 40 MG tablet Take 0.5 tablets (20 mg total) by mouth daily. 06/01/13  Yes [name], MD  gabapentin (NEURONTIN) 300 MG capsule Take 300 mg by mouth 3 (three) times daily.    Yes Historical Provider, MD  glimepiride (AMARYL) 4 MG tablet TAKE 1 TABLET (4 MG TOTAL) BY MOUTH 2 (TWO) TIMES DAILY. Patient taking differently: TAKE 1 TABLET (4 MG TOTAL) BY MOUTH 2 (TWO) TIMES AT [facility] AND BEDTIME 06/16/14  Yes [name], MD  HYDROcodone-acetaminophen (NORCO/VICODIN) 5-325 MG tablet Take 1 tablet by mouth every 6 (six) hours as needed. for pain 07/30/15  Yes Historical Provider, MD  losartan (COZAAR) 50 MG tablet TAKE 1 TABLET BY MOUTH DAILY FOR BLOOD PRESSURE AND KIDNEY 04/28/15  Yes [name], MD  Magnesium 400 MG CAPS Take 1 capsule by mouth 2 (two) times daily.    Yes Historical Provider, MD  meloxicam (MOBIC) 15 MG tablet TAKE 1 TABLET EVERY DAY FOR PAIN AND INFLAMATION 05/20/15  Yes [name], MD  metFORMIN (GLUCOPHAGE-XR) 500 MG 24 hr tablet TAKE 2 TABLETS TWICE A DAY 07/25/15  Yes Courtney Forcucci, PA-C  OVER THE COUNTER MEDICATION 150 mg at bedtime. CVS acid  reduced 75 mg at bedtime   Yes Historical Provider, MD  rOPINIRole (REQUIP) 3 MG tablet Take 1 tablet (3 mg total) by mouth at bedtime. 06/18/15 06/18/16 Yes Courtney Forcucci, PA-C  glucose blood test strip Use as instructed 07/21/15   [name], PA-C     Physical Exam: Filed Vitals:   08/05/15 2015 08/05/15 2130 08/05/15 2219 08/05/15 2230  BP: 130/63 155/75 155/75 140/70  Pulse: 73 73 76 73  Temp:      TempSrc:      Resp: 18 25 20 19   SpO2: 92% 94% 98% 96%    Constitutional: Vital signs reviewed. Patient is a obese female who appears to be comfortable in no acute distress at this time. Head: Normocephalic and atraumatic  Ear: TM normal bilaterally  Mouth: no erythema or  exudates, MMM  Eyes: PERRL, EOMI, conjunctivae normal, No scleral icterus.  Neck: Supple, Trachea midline normal ROM, No JVD, mass, thyromegaly, or carotid bruit present.  Cardiovascular: RRR, S1 normal, S2 normal, no MRG, pulses symmetric and intact bilaterally. Chest pain reproducible with palpation of the right anterior chest wall Pulmonary/Chest: Diminished aeration, CTAB, no wheezes, rales, or rhonchi  Abdominal: Soft. Non-tender, non-distended, bowel sounds are normal, no masses, organomegaly, or guarding present.  GU: no CVA tenderness Musculoskeletal: No joint deformities, erythema, or stiffness, ROM full and no nontender Ext: no edema and no cyanosis, pulses palpable bilaterally (DP and PT)  Hematology: no cervical, inginal, or axillary adenopathy.  Neurological: A&O x3, Strenght is normal and symmetric bilaterally, cranial nerve II-XII are grossly intact, no focal motor deficit, sensory intact to light touch bilaterally.  Skin: Warm, dry and intact. No rash, cyanosis, or clubbing.  Psychiatric: Normal mood and affect. speech and behavior is normal. Judgment and thought content normal. Cognition and memory are normal.      Data Review   Micro Results No results found for this or any previous  visit (from the past 240 hour(s)).  Radiology Reports Dg Chest 2 View  08/05/2015  CLINICAL DATA:  Right side chest pain.  Shortness of Breath EXAM: CHEST  2 VIEW COMPARISON:  01/14/2011 FINDINGS: Heart is borderline in size. No confluent airspace opacities or effusions. No acute bony abnormality. IMPRESSION: No active cardiopulmonary disease. Electronically Signed   By: Rolm Baptise M.D.   On: 08/05/2015 17:01   Ct Angio Chest Pe W/cm &/or Wo Cm  08/05/2015  CLINICAL DATA:  Acute onset of worsening shortness of breath and generalized chest tightness. Right-sided chest pain. Decreased hemoglobin. Initial encounter. EXAM: CT ANGIOGRAPHY CHEST WITH CONTRAST TECHNIQUE: Multidetector CT imaging of the chest was performed using the standard protocol during bolus administration of intravenous contrast. Multiplanar CT image reconstructions and MIPs were obtained to evaluate the vascular anatomy. CONTRAST:  80 mL of Isovue 370 IV contrast COMPARISON:  Chest radiograph performed earlier today at 4:48 p.m. FINDINGS: There is no evidence of pulmonary embolus. The lungs are essentially clear bilaterally. There is no evidence of significant focal consolidation, pleural effusion or pneumothorax. No masses are identified; no abnormal focal contrast enhancement is seen. A 1.2 cm precarinal node is noted. Visualized mediastinal nodes are otherwise unremarkable. No pericardial effusion is seen. Scattered calcification is noted along the proximal great vessels. No axillary lymphadenopathy is seen. The thyroid gland is unremarkable in appearance. The visualized portions of the liver and spleen are unremarkable. A small hiatal hernia is noted. No acute osseous abnormalities are seen. There is mild grade 1 anterolisthesis at multiple levels along the upper to mid thoracic spine. Review of the MIP images confirms the above findings. IMPRESSION: 1. No evidence of pulmonary embolus. 2. Lungs clear bilaterally. 3. 1.2 cm precarinal  node seen, of uncertain significance. 4. Small hiatal hernia noted. Electronically Signed   By: Garald Balding M.D.   On: 08/05/2015 23:47   Mr Lumbar Spine Wo Contrast  07/15/2015  CLINICAL DATA:  75 year old female with bilateral buttock pain and bilateral leg pain extending to feet. Increasing pain over the past month. Bowel incontinence for 6 months. Surgery 8-10 years ago (x2). No history of cancer. Initial encounter. EXAM: MRI LUMBAR SPINE WITHOUT CONTRAST TECHNIQUE: Multiplanar, multisequence MR imaging of the lumbar spine was performed. No intravenous contrast was administered. COMPARISON:  05/13/2004 lumbar spine MR. FINDINGS: Last fully open disk space is labeled L5-S1. Present  examination incorporates from T10-11 disc space lower sacrum. Conus L1-2 level.  No compression of the distal cord/ conus. Left renal cyst incompletely assessed on present exam. T10-11:  Mild bulge.  Mild narrowing ventral thecal sac. T11-12: Minimal to mild bulge. Minimal to mild narrowing ventral thecal sac. T12-L1: Bulge. Mild narrowing ventral thecal sac. Facet degenerative changes. Mild bilateral foraminal narrowing. L1-2: Bulge with greater extension left lateral position approaching but not compressing exiting nerve root. Facet degenerative changes. L2-3: Disc degeneration with endplate reactive changes greater on the right. Bulge. Moderate facet degenerative changes containing fluid. Ligamentum flavum hypertrophy. Multifactorial moderate spinal stenosis. Multifactorial moderate right-sided and mild-to-moderate left-sided foraminal narrowing. L3-4: Prior right hemilaminectomy. Prominent facet degenerative changes and bony overgrowth. Ligamentum flavum hypertrophy. 4 mm anterior slip L3. Prominent disc degeneration with endplate reactive changes. Posterior superior aspect of the L4 vertebra projects into the neural foramen greater on the right. Right greater left foraminal narrowing and encroachment upon exiting L3 nerve  roots greater on the right. Multifactorial marked spinal stenosis and lateral recess stenosis greater on the right. L4-5: Prior left hemilaminectomy. Disc degeneration. Broad-based disc osteophyte complex greater to left. Facet degenerative changes. Multifactorial marked left-sided and mild to moderate right-sided lateral recess stenosis. Multifactorial moderate left-sided and mild-to-moderate right-sided spinal stenosis. Multifactorial mild right-sided and moderate left-sided foraminal narrowing with mild encroachment upon the exiting left L4 nerve root. L5-S1: Marked facet degenerative changes. Fluid within the degenerated facet joints. Ligamentum flavum hypertrophy. Minimal anterior slip L5. Moderate bulge with greatest extension left foraminal/ lateral position. Mild to moderate right-sided and moderate to marked left-sided foraminal narrowing with mass effect upon the exiting left L5 nerve root. Multifactorial moderate spinal stenosis and lateral recess stenosis greater on the left. IMPRESSION: Progressive degenerative changes since 2006 MR. Summary of pertinent findings includes: L2-3 multifactorial moderate spinal stenosis, moderate right-sided and mild-to-moderate left-sided foraminal narrowing. L3-4 multifactorial marked right-sided and mild-to-moderate left-sided foraminal narrowing with mass effect upon exiting L3 nerve roots greater on the right. Multifactorial marked spinal stenosis and lateral recess stenosis greater on the right. L4-5 multifactorial marked left-sided and mild to moderate right-sided lateral recess stenosis. Multifactorial moderate left-sided and mild-to-moderate right-sided spinal stenosis. Multifactorial mild right-sided and moderate left-sided foraminal narrowing with mild encroachment upon the exiting left L4 nerve root. L5-S1 multifactorial mild to moderate right-sided and moderate to marked left-sided foraminal narrowing with mass effect upon the exiting left L5 nerve root.  Multifactorial moderate spinal stenosis and lateral recess stenosis greater on the left. Please see above for further detail. Electronically Signed   By: Genia Del M.D.   On: 07/15/2015 08:29     CBC  Recent Labs Lab 08/05/15 1650  WBC 6.0  HGB 9.1*  HCT 30.8*  PLT 273  MCV 77.2*  MCH 22.8*  MCHC 29.5*  RDW 17.6*    Chemistries   Recent Labs Lab 08/05/15 1650  NA 139  K 4.7  CL 103  CO2 25  GLUCOSE 247*  BUN 22*  CREATININE 1.27*  CALCIUM 9.7   ------------------------------------------------------------------------------------------------------------------ estimated creatinine clearance is 42.8 mL/min (by C-G formula based on Cr of 1.27). ------------------------------------------------------------------------------------------------------------------ No results for input(s): HGBA1C in the last 72 hours. ------------------------------------------------------------------------------------------------------------------ No results for input(s): CHOL, HDL, LDLCALC, TRIG, CHOLHDL, LDLDIRECT in the last 72 hours. ------------------------------------------------------------------------------------------------------------------ No results for input(s): TSH, T4TOTAL, T3FREE, THYROIDAB in the last 72 hours.  Invalid input(s): FREET3 ------------------------------------------------------------------------------------------------------------------ No results for input(s): VITAMINB12, FOLATE, FERRITIN, TIBC, IRON, RETICCTPCT in the last 72 hours.  Coagulation profile  No results for input(s): INR, PROTIME in the last 168 hours.   Recent Labs  08/05/15 2043  DDIMER 0.59*    Cardiac Enzymes No results for input(s): CKMB, TROPONINI, MYOGLOBIN in the last 168 hours.  Invalid input(s): CK ------------------------------------------------------------------------------------------------------------------ Invalid input(s): POCBNP   CBG: No results for input(s): GLUCAP in  the last 168 hours.     EKG: Independently reviewed.  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  Assessment/Plan Shortness of breath with Hypoxia: Acute. Patient with O2 sats of 88% on room air requiring nasal cannula oxygen to improve to greater than 92%. Cause is unknown at this time ruled out possibility of a PE after a positive d-dimer. Physical exam reveals decreased overall air movement. - Admit to a telemetry bed  - Continuous pulse oximetry with nasal cannula oxygen keep O2 sats greater than 92% - Likely benefit from a 6 minute walk after other causes for symptoms evaluated.  Chest pain: Patient reports complaints of intermittent heaviness in her chest. On physical exam pain is reproducible on palpation. Heart score approximately 5. Suspect costochondritis, but want to rule out cardiac source. Initial EKG shows no acute ST changes and troponins negative.  - trend cardiac troponins - Repeat EKG at 6 AM - Echocardiogram in a.m. - Consult cardiology in a.m for further evaluation.   Elevated d-dimer: As seen above - IV fluids normal saline at 100 ml/hour overnight following the recent dye load for angiogram    Chronic kidney disease stage III: Creatinine near baseline appears to be stable - Repeat BMP in a.m.  Iron deficiency anemia : Stable. Hemoglobin 9.1 on admission. Stool guaiac negative for signs of blood. - Continue ferrous sulfate - Recheck CBC in am  Essential hypertension -Continue losartan  - held furosemide until 3/30  Diabetes mellitus type 2 uncontrolled: Last hemoglobin A1c elevated at 10 and patient's blood glucose acutely elevated at 247 on admission. - held metformin and glyburide  - CBGs every 4 hours with sliding scale insulin  Dyslipidemia : Patient found to have cholesterol 251, triglycerides 677, and HDL 31 when last checked earlier this month.  - continue colesevelam  Anxiety -Continue Prozac and Klonopin prn   Code Status:    full Family Communication: bedside Disposition Plan: admit   Total time spent 55 minutes.Greater than 50% of this time was spent in counseling, explanation of diagnosis, planning of further management, and coordination of care  [facility] Pager [phone]  If 7PM-7AM, please contact night-coverage [email or web address] Password [email or web address] 08/06/2015, 1:30 AM

## 2015-08-06 NOTE — Progress Notes (Signed)
Patient Demographics  Janet Choi, is a 75 y.o. female, DOB - 06-13-1940, [id number]  Admit date - 08/05/2015   Admitting Physician Norval Morton, MD  Outpatient Primary MD for the patient is Alesia Richards, MD  LOS - 0   Chief Complaint  Patient presents with  . Shortness of Breath  . Chest Pain       Admission HPI/Brief narrative: 75 year old female with a past medical history significant for diabetes mellitus, HTN, HLD, OSA, and iron deficiency anemia; who presented with complaints of worsening shortness of breath. Non typical chest pain, admitted for further work up, CTA negative for PE, admitted for further workup. Subjective:   Janet Choi today has, No headache,  No abdominal pain - No Nausea, No new weakness tingling or numbness, reports no further chest pain, dyspnea is improving.  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 Unclear etiologies, CTA chest negative for PE, no evidence of infectious process or volume overload. - Will need home oxygen on discharge  Chest pain - Nontypical, musculoskeletal, currently resolved - Initial EKG nonacute - Follow on troponin trend - Follow on 2-D echo   Elevated d-dimer: - CTA chest negative for PE - IV fluids normal saline at 100 ml/hour overnight following the recent dye load for angiogram   Chronic kidney disease stage III: Creatinine near baseline appears to be stable - Repeat BMP in a.m.  Iron deficiency anemia :  - Hemoglobin 9.1 on admission. Drop to 7.6 this a.m., most likely dilutional as on IV fluids. Stool guaiac negative , no signs of active bleeding .for signs of blood. - Continue ferrous sulfate - As well B-12 deficiency, will give IV iron  and will start on B-12 injections  Essential hypertension - Continue losartan  - held furosemide until 3/30  Diabetes mellitus type 2 uncontrolled: -  Last hemoglobin A1c elevated at 10 and patient's blood glucose acutely elevated at 247 on admission. - held metformin and glyburide  - Continue with moderate insulin sliding scale  Dyslipidemia : -  Patient found to have cholesterol 251, triglycerides 677, and HDL 31 when last checked earlier this month.  - continue colesevelam  Anxiety -Continue Prozac and Klonopin prn   Code Status: Full  Family Communication: Daughter at bedside  Disposition Plan: Home when stable   Procedures  none   Consults   none   Medications  Scheduled Meds: . colesevelam  625 mg Oral BID WC  . cyanocobalamin  1,000 mcg Intramuscular Q24H  . diclofenac  1 patch Transdermal BID  . enoxaparin (LOVENOX) injection  40 mg Subcutaneous Q24H  . ferrous sulfate  325 mg Oral BID  . ferumoxytol  510 mg Intravenous Once  . FLUoxetine  40 mg Oral Daily  . [START ON 08/07/2015] furosemide  20 mg Oral Daily  . gabapentin  300 mg Oral TID  . insulin aspart  0-9 Units Subcutaneous 6 times per day  . losartan  50 mg Oral Daily  . magnesium oxide  400 mg Oral BID  . rOPINIRole  3 mg Oral QHS   Continuous Infusions: . sodium chloride 100 mL/hr at 08/06/15 0226   PRN Meds:.acetaminophen, albuterol, clonazePAM, gi cocktail, ondansetron (ZOFRAN) IV  DVT Prophylaxis  Lovenox  Lab Results  Component Value Date   PLT 192 08/06/2015    Antibiotics    Anti-infectives    None          Objective:   Filed Vitals:   08/06/15 0208 08/06/15 0555 08/06/15 0747 08/06/15 1000  BP: 124/68 112/53  145/68  Pulse: 73 74  78  Temp: 98.1 F (36.7 C) 98.4 F (36.9 C)  98 F (36.7 C)  TempSrc: Oral Oral  Oral  Resp: 20 16  18   Height: 5\' 2"  (1.575 m)     Weight: 97.614 kg (215 lb 3.2 oz)     SpO2: 92% 92% 95% 94%    Wt Readings from Last 3  Encounters:  08/06/15 97.614 kg (215 lb 3.2 oz)  08/05/15 97.523 kg (215 lb)  07/24/15 98.34 kg (216 lb 12.8 oz)     Intake/Output Summary (Last 24 hours) at 08/06/15 1048 Last data filed at 08/06/15 1015  Gross per 24 hour  Intake    120 ml  Output      0 ml  Net    120 ml     Physical Exam  Awake Alert, Oriented X 3, No new F.N deficits, Normal affect [place].AT,PERRAL Supple Neck,No JVD, No cervical lymphadenopathy appriciated.  Symmetrical Chest wall movement, Good air movement bilaterally, CTAB RRR,No Gallops,Rubs or new Murmurs, No Parasternal Heave +ve B.Sounds, Abd Soft, No tenderness, No organomegaly appriciated, No rebound - guarding or rigidity. No Cyanosis, Clubbing or edema, No new Rash or bruise     Data Review   Micro Results No results found for this or any previous visit (from the past 240 hour(s)).  Radiology Reports Dg Chest 2 View  08/05/2015  CLINICAL DATA:  Right side chest pain.  Shortness of Breath EXAM: CHEST  2 VIEW COMPARISON:  01/14/2011 FINDINGS: Heart is borderline in size. No confluent airspace opacities or effusions. No acute bony abnormality. IMPRESSION: No active cardiopulmonary disease. Electronically Signed   By: Rolm Baptise M.D.   On: 08/05/2015 17:01   Ct Angio Chest Pe W/cm &/or Wo Cm  08/05/2015  CLINICAL DATA:  Acute onset of worsening shortness of breath and generalized chest tightness. Right-sided chest pain. Decreased hemoglobin. Initial encounter. EXAM: CT ANGIOGRAPHY CHEST WITH CONTRAST TECHNIQUE: Multidetector CT imaging of the chest was performed using the standard protocol during bolus administration of intravenous contrast. Multiplanar CT image reconstructions and MIPs were obtained to evaluate the vascular anatomy. CONTRAST:  80 mL of Isovue 370 IV contrast COMPARISON:  Chest radiograph performed earlier today at 4:48 p.m. FINDINGS: There is no evidence of pulmonary embolus. The lungs are essentially clear bilaterally. There is no  evidence of significant focal consolidation, pleural effusion or pneumothorax. No masses are identified; no abnormal focal contrast enhancement is seen. A 1.2 cm precarinal node is noted. Visualized mediastinal nodes are otherwise unremarkable. No pericardial effusion is seen. Scattered calcification is noted along the proximal great vessels. No axillary lymphadenopathy is seen. The thyroid gland is unremarkable in appearance. The visualized portions of the liver and spleen are unremarkable. A small hiatal hernia is noted. No acute osseous abnormalities are seen. There is mild grade 1 anterolisthesis at multiple levels along the upper to mid thoracic spine. Review of the MIP images confirms the above findings. IMPRESSION: 1. No evidence of  pulmonary embolus. 2. Lungs clear bilaterally. 3. 1.2 cm precarinal node seen, of uncertain significance. 4. Small hiatal hernia noted. Electronically Signed   By: Garald Balding M.D.   On: 08/05/2015 23:47   Mr Lumbar Spine Wo Contrast  07/15/2015  CLINICAL DATA:  75 year old female with bilateral buttock pain and bilateral leg pain extending to feet. Increasing pain over the past month. Bowel incontinence for 6 months. Surgery 8-10 years ago (x2). No history of cancer. Initial encounter. EXAM: MRI LUMBAR SPINE WITHOUT CONTRAST TECHNIQUE: Multiplanar, multisequence MR imaging of the lumbar spine was performed. No intravenous contrast was administered. COMPARISON:  05/13/2004 lumbar spine MR. FINDINGS: Last fully open disk space is labeled L5-S1. Present examination incorporates from T10-11 disc space lower sacrum. Conus L1-2 level.  No compression of the distal cord/ conus. Left renal cyst incompletely assessed on present exam. T10-11:  Mild bulge.  Mild narrowing ventral thecal sac. T11-12: Minimal to mild bulge. Minimal to mild narrowing ventral thecal sac. T12-L1: Bulge. Mild narrowing ventral thecal sac. Facet degenerative changes. Mild bilateral foraminal narrowing. L1-2:  Bulge with greater extension left lateral position approaching but not compressing exiting nerve root. Facet degenerative changes. L2-3: Disc degeneration with endplate reactive changes greater on the right. Bulge. Moderate facet degenerative changes containing fluid. Ligamentum flavum hypertrophy. Multifactorial moderate spinal stenosis. Multifactorial moderate right-sided and mild-to-moderate left-sided foraminal narrowing. L3-4: Prior right hemilaminectomy. Prominent facet degenerative changes and bony overgrowth. Ligamentum flavum hypertrophy. 4 mm anterior slip L3. Prominent disc degeneration with endplate reactive changes. Posterior superior aspect of the L4 vertebra projects into the neural foramen greater on the right. Right greater left foraminal narrowing and encroachment upon exiting L3 nerve roots greater on the right. Multifactorial marked spinal stenosis and lateral recess stenosis greater on the right. L4-5: Prior left hemilaminectomy. Disc degeneration. Broad-based disc osteophyte complex greater to left. Facet degenerative changes. Multifactorial marked left-sided and mild to moderate right-sided lateral recess stenosis. Multifactorial moderate left-sided and mild-to-moderate right-sided spinal stenosis. Multifactorial mild right-sided and moderate left-sided foraminal narrowing with mild encroachment upon the exiting left L4 nerve root. L5-S1: Marked facet degenerative changes. Fluid within the degenerated facet joints. Ligamentum flavum hypertrophy. Minimal anterior slip L5. Moderate bulge with greatest extension left foraminal/ lateral position. Mild to moderate right-sided and moderate to marked left-sided foraminal narrowing with mass effect upon the exiting left L5 nerve root. Multifactorial moderate spinal stenosis and lateral recess stenosis greater on the left. IMPRESSION: Progressive degenerative changes since 2006 MR. Summary of pertinent findings includes: L2-3 multifactorial moderate  spinal stenosis, moderate right-sided and mild-to-moderate left-sided foraminal narrowing. L3-4 multifactorial marked right-sided and mild-to-moderate left-sided foraminal narrowing with mass effect upon exiting L3 nerve roots greater on the right. Multifactorial marked spinal stenosis and lateral recess stenosis greater on the right. L4-5 multifactorial marked left-sided and mild to moderate right-sided lateral recess stenosis. Multifactorial moderate left-sided and mild-to-moderate right-sided spinal stenosis. Multifactorial mild right-sided and moderate left-sided foraminal narrowing with mild encroachment upon the exiting left L4 nerve root. L5-S1 multifactorial mild to moderate right-sided and moderate to marked left-sided foraminal narrowing with mass effect upon the exiting left L5 nerve root. Multifactorial moderate spinal stenosis and lateral recess stenosis greater on the left. Please see above for further detail. Electronically Signed   By: Genia Del M.D.   On: 07/15/2015 08:29     CBC  Recent Labs Lab 08/05/15 1650 08/06/15 0522  WBC 6.0 4.4  HGB 9.1* 7.6*  HCT 30.8* 26.1*  PLT 273 192  MCV  77.2* 75.7*  MCH 22.8* 22.0*  MCHC 29.5* 29.1*  RDW 17.6* 17.5*  LYMPHSABS  --  1.4  MONOABS  --  0.3  EOSABS  --  0.3  BASOSABS  --  0.0    Chemistries   Recent Labs Lab 08/05/15 1650 08/06/15 0522  NA 139 136  K 4.7 3.6  CL 103 102  CO2 25 24  GLUCOSE 247* 169*  BUN 22* 21*  CREATININE 1.27* 1.22*  CALCIUM 9.7 8.9   ------------------------------------------------------------------------------------------------------------------ estimated creatinine clearance is 44.1 mL/min (by C-G formula based on Cr of 1.22). ------------------------------------------------------------------------------------------------------------------ No results for input(s): HGBA1C in the last 72  hours. ------------------------------------------------------------------------------------------------------------------ No results for input(s): CHOL, HDL, LDLCALC, TRIG, CHOLHDL, LDLDIRECT in the last 72 hours. ------------------------------------------------------------------------------------------------------------------ No results for input(s): TSH, T4TOTAL, T3FREE, THYROIDAB in the last 72 hours.  Invalid input(s): FREET3 ------------------------------------------------------------------------------------------------------------------ No results for input(s): VITAMINB12, FOLATE, FERRITIN, TIBC, IRON, RETICCTPCT in the last 72 hours.  Coagulation profile No results for input(s): INR, PROTIME in the last 168 hours.   Recent Labs  08/05/15 2043  DDIMER 0.59*    Cardiac Enzymes  Recent Labs Lab 08/06/15 0139  TROPONINI <0.03   ------------------------------------------------------------------------------------------------------------------ Invalid input(s): POCBNP     Time Spent in minutes   No charge   [facility], [place] Sylvan M.D on 08/06/2015 at 10:48 AM  Between 7am to 7pm - Pager - [phone]  After 7pm go to [email or web address] - password [facility]  [facility]   Office  [phone]

## 2015-08-06 NOTE — ED Notes (Signed)
Admitting dr in room with pt

## 2015-08-06 NOTE — Progress Notes (Signed)
Patient informed staff that she took home dose of Tylenol. Patient states that she also has hydrocodone along with the tylenol. Educated patient that self medicating is not allowed and informed her to send home the medications with her sister.

## 2015-08-06 NOTE — Progress Notes (Signed)
SATURATION QUALIFICATIONS: (This note is used to comply with regulatory documentation for home oxygen)  Patient Saturations on Room Air at Rest = 94%  Patient Saturations on Room Air while Ambulating = 82%  Patient Saturations on 2 Liters of oxygen while Ambulating = 98%  Please briefly explain why patient needs home oxygen: pt oxygen saturation while ambulating dropped down to 82%. Pt oxygen saturation recovered to 98% when placed on 2L

## 2015-08-06 NOTE — Care Management Note (Signed)
Case Management Note  Patient Details  Name: Janet Choi MRN: [id number] Date of Birth: 03/03/1941  Subjective/Objective:                  Shortness of breath and chest pain Action/Plan: Discharge planning Expected Discharge Date:                  Expected Discharge Plan:  Home/Self Care  In-House Referral:     Discharge planning Services  CM Consult  Post Acute Care Choice:  Durable Medical Equipment Choice offered to:  Patient  DME Arranged:  Oxygen DME Agency:  [place]:  NA [place] Agency:  NA  Status of Service:  Completed, signed off  Medicare Important Message Given:    Date Medicare IM Given:    Medicare IM give by:    Date Additional Medicare IM Given:    Additional Medicare Important Message give by:     If discussed at [place] of Stay Meetings, dates discussed:    Additional Comments: CM met with pt in room to discuss home needs.  Pt states she lives alone, has a handy man and her sister will provide transportation home.  Pt states she does not need any home health services, though she agrees with having home O2.  Pt states she gets her CPAP supplies from [facility] but prefers [facility] to provide home O2.  CM called [facility] rep, Santiago Glad to please authorize and CM called [facility] DME rep, Colletta [place] to please deliver once authorized by insurance.  NO other CM needs were communicated. Dellie Catholic, RN 08/06/2015, 2:09 PM

## 2015-08-07 DIAGNOSIS — I5031 Acute diastolic (congestive) heart failure: Secondary | ICD-10-CM | POA: Diagnosis not present

## 2015-08-07 DIAGNOSIS — G4733 Obstructive sleep apnea (adult) (pediatric): Secondary | ICD-10-CM | POA: Diagnosis not present

## 2015-08-07 DIAGNOSIS — R0602 Shortness of breath: Secondary | ICD-10-CM

## 2015-08-07 DIAGNOSIS — R0902 Hypoxemia: Secondary | ICD-10-CM | POA: Diagnosis not present

## 2015-08-07 DIAGNOSIS — I10 Essential (primary) hypertension: Secondary | ICD-10-CM

## 2015-08-07 LAB — CBC
HCT: 28 % — ABNORMAL LOW (ref 36.0–46.0)
Hemoglobin: 8.1 g/dL — ABNORMAL LOW (ref 12.0–15.0)
MCH: 22.8 pg — ABNORMAL LOW (ref 26.0–34.0)
MCHC: 28.9 g/dL — AB (ref 30.0–36.0)
MCV: 78.7 fL (ref 78.0–100.0)
PLATELETS: 216 10*3/uL (ref 150–400)
RBC: 3.56 MIL/uL — ABNORMAL LOW (ref 3.87–5.11)
RDW: 18 % — AB (ref 11.5–15.5)
WBC: 4 10*3/uL (ref 4.0–10.5)

## 2015-08-07 LAB — BASIC METABOLIC PANEL
ANION GAP: 9 (ref 5–15)
BUN: 20 mg/dL (ref 6–20)
CALCIUM: 8.9 mg/dL (ref 8.9–10.3)
CO2: 24 mmol/L (ref 22–32)
CREATININE: 1.21 mg/dL — AB (ref 0.44–1.00)
Chloride: 105 mmol/L (ref 101–111)
GFR, EST AFRICAN AMERICAN: 50 mL/min — AB (ref >60)
GFR, EST NON AFRICAN AMERICAN: 43 mL/min — AB (ref >60)
GLUCOSE: 297 mg/dL — AB (ref 65–99)
Potassium: 4.3 mmol/L (ref 3.5–5.1)
Sodium: 138 mmol/L (ref 135–145)

## 2015-08-07 LAB — HEMOGLOBIN A1C
Hgb A1c MFr Bld: 9.6 % — ABNORMAL HIGH (ref 4.8–5.6)
MEAN PLASMA GLUCOSE: 229 mg/dL

## 2015-08-07 LAB — GLUCOSE, CAPILLARY
GLUCOSE-CAPILLARY: 261 mg/dL — AB (ref 65–99)
Glucose-Capillary: 291 mg/dL — ABNORMAL HIGH (ref 65–99)

## 2015-08-07 MED ORDER — CYANOCOBALAMIN 1000 MCG/ML IJ SOLN: INTRAMUSCULAR | Status: DC

## 2015-08-07 MED ORDER — FERROUS SULFATE 325 (65 FE) MG PO TABS: 325.0000 mg | ORAL_TABLET | Freq: Two times a day (BID) | ORAL | Status: DC

## 2015-08-07 MED ORDER — FUROSEMIDE 40 MG PO TABS: 40.0000 mg | ORAL_TABLET | Freq: Every day | ORAL | Status: DC

## 2015-08-07 MED ORDER — GLIMEPIRIDE 4 MG PO TABS
4.0000 mg | ORAL_TABLET | Freq: Two times a day (BID) | ORAL | Status: DC
  Administered 2015-08-07: 4 mg via ORAL
  Filled 2015-08-07 (×3): qty 1

## 2015-08-07 MED ORDER — FUROSEMIDE 10 MG/ML IJ SOLN
40.0000 mg | Freq: Once | INTRAMUSCULAR | Status: AC
  Administered 2015-08-07: 40 mg via INTRAVENOUS
  Filled 2015-08-07: qty 4

## 2015-08-07 MED ORDER — METFORMIN HCL ER 500 MG PO TB24: 1000.0000 mg | ORAL_TABLET | Freq: Two times a day (BID) | ORAL | Status: DC

## 2015-08-07 NOTE — Discharge Instructions (Signed)
Follow with Primary MD MCKEOWN,WILLIAM DAVID, MD in 7 days   Get CBC, CMP, 2 view Chest X ray checked  by Primary MD next visit.    Activity: As tolerated with Full fall precautions use walker/cane & assistance as needed   Disposition Home   Diet: Heart Healthy , carbohydrate modified, low-sodium, with feeding assistance and aspiration precautions.  For Heart failure patients - Check your Weight same time everyday, if you gain over 2 pounds, or you develop in leg swelling, experience more shortness of breath or chest pain, call your Primary MD immediately. Follow Cardiac Low Salt Diet and 1.5 lit/day fluid restriction.   On your next visit with your primary care physician please Get Medicines reviewed and adjusted.   Please request your Prim.MD to go over all Hospital Tests and Procedure/Radiological results at the follow up, please get all Hospital records sent to your Prim MD by signing hospital release before you go home.   If you experience worsening of your admission symptoms, develop shortness of breath, life threatening emergency, suicidal or homicidal thoughts you must seek medical attention immediately by calling 911 or calling your MD immediately  if symptoms less severe.  You Must read complete instructions/literature along with all the possible adverse reactions/side effects for all the Medicines you take and that have been prescribed to you. Take any new Medicines after you have completely understood and accpet all the possible adverse reactions/side effects.   Do not drive, operating heavy machinery, perform activities at heights, swimming or participation in water activities or provide baby sitting services if your were admitted for syncope or siezures until you have seen by Primary MD or a Neurologist and advised to do so again.  Do not drive when taking Pain medications.    Do not take more than prescribed Pain, Sleep and Anxiety Medications  Special Instructions: If  you have smoked or chewed Tobacco  in the last 2 yrs please stop smoking, stop any regular Alcohol  and or any Recreational drug use.  Wear Seat belts while driving.   Please note  You were cared for by a hospitalist during your hospital stay. If you have any questions about your discharge medications or the care you received while you were in the hospital after you are discharged, you can call the unit and asked to speak with the hospitalist on call if the hospitalist that took care of you is not available. Once you are discharged, your primary care physician will handle any further medical issues. Please note that NO REFILLS for any discharge medications will be authorized once you are discharged, as it is imperative that you return to your primary care physician (or establish a relationship with a primary care physician if you do not have one) for your aftercare needs so that they can reassess your need for medications and monitor your lab values.

## 2015-08-07 NOTE — Progress Notes (Signed)
Patient given discharge instructions, and verbalized an understanding of all discharge instructions.  Patient agrees with discharge plan, and is being discharged in stable medical condition.  Patient given transportation via wheelchair. 

## 2015-08-07 NOTE — Progress Notes (Signed)
Inpatient Diabetes Program Recommendations  AACE/ADA: New Consensus Statement on Inpatient Glycemic Control (2015)  Target Ranges:  Prepandial:   less than 140 mg/dL      Peak postprandial:   less than 180 mg/dL (1-2 hours)      Critically ill patients:  140 - 180 mg/dL   Review of Glycemic Control  Diabetes history: DM2 Outpatient Diabetes medications: metformin 1000 mg bid, Amaryl 4 mg bid Current orders for Inpatient glycemic control: Novolog moderate tidwc, Amaryl 4 mg bid, metformin 1000 mg bid - ON HOLD  Results for JANIJAH, VELLA (MRN [id number]) as of 08/07/2015 09:37  Ref. Range 08/06/2015 07:27 08/06/2015 10:50 08/06/2015 17:33 08/06/2015 21:10 08/07/2015 07:42  Glucose-Capillary Latest Ref Range: 65-99 mg/dL 204 (H) 267 (H) 220 (H) 310 (H) 291 (H)  Results for ROSELYNNE, DEVOSS (MRN [id number]) as of 08/07/2015 09:37  Ref. Range 07/21/2015 15:39 08/06/2015 11:09  Hemoglobin A1C Latest Ref Range: 4.8-5.6 % 10.0 (H) 9.6 (H)   HgbA1c indicates sub-optimal control at home. May benefit from addition of basal insulin.  Inpatient Diabetes Program Recommendations:    Consider addition of Levemir 12 units QHS Increase Novolog to moderate tidwc and hs.  Will follow. Thank you. Lorenda Peck, RD, LDN, CDE Inpatient Diabetes Coordinator [phone]

## 2015-08-07 NOTE — Progress Notes (Signed)
[place] is providing the following services: Home oxygen  If patient discharges after hours, please call [phone].   Linward Headland 08/07/2015, 1:42 PM

## 2015-08-07 NOTE — Discharge Summary (Signed)
Janet Choi, is a 75 y.o. female  DOB 1940-12-11  MRN [id number].  Admission date:  08/05/2015  Admitting Physician  Norval Morton, MD  Discharge Date:  08/07/2015   Primary MD  Alesia Richards, MD  Recommendations for primary care physician for things to follow:  - Please check CBC, BMP during next visit. - Patient Lasix dose was increased, monitor and adjust diuresis as needed. - Patient will be discharged on 2 L nasal cannula on exertion, he can be stopped after she is appropriately diuresed.   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 Diabetes mellitus without complication ([place])   . Hypertension   . Hyperlipidemia   . OSA (obstructive sleep apnea)   . DJD (degenerative joint disease)   . RLS (restless legs syndrome)   . Obesity   . Vitamin D deficiency   . Anxiety   . IBS (irritable bowel syndrome)     Past Surgical History  Procedure Laterality Date  . Total knee arthroplasty Bilateral   . Cataract extraction Bilateral   . Rotator cuff repair Bilateral   . Lumbar disc surgery      x 2  . Appendectomy         History of present illness and  Hospital Course:     Kindly see H&P for history of present illness and admission details, please review complete Labs, Consult reports and Test reports for all details in brief  HPI  from the history and physical done on the day of admission 08/06/2015 HPI:  Janet Choi is a 75 year old female with a past medical history significant for diabetes mellitus, HTN, HLD, OSA, and iron deficiency anemia; who presented with complaints of worsening shortness of breath over the last week. She complains of  intermittent chest tightness and a pressure-like feeling. Shortness of breath symptoms worsen with exertion. Patient complains of not being able to walk across the room without feeling very winded. She reports a chronic hacking like cough. She reports being recently diagnosed with iron deficiency anemia for which she has been on iron supplementation. Hemoglobin had dropped as low as 8.3 she did not require any transfusions. She reports that stools have been dark, but that's since she spent taking the iron supplements. She gone to see her primary care provider with complaints of shortness of breath and her primary care provider had erected her to the emergency department for concerns of the patient suffering from symptomatic anemia.  Upon admission to the emergency department she was evaluated and seen have a elevated d-dimer for which a CT with PE protocol was performed showed no signs of a pulmonary embolus. O2 sats 87- 88% laying in the bed with head elevated for which 2 L nasal cannula oxygen  was applied and an O2 sats improved to greater than 92%. Patient's hemoglobin was seen to be slightly improved to 9.1, previously 8.5 12 days ago. BNP was within normal limits initial troponins negative. 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   Hospital Course  75 year old female with a past medical history significant for diabetes mellitus, HTN, HLD, OSA, and iron deficiency anemia; who presented with complaints of worsening shortness of breath. Non typical chest pain, admitted for further work up, CTA negative for PE, admitted for further workup.  Dyspnea with hypoxia - Patient with hypoxia on exertion, CTA chest with no evidence of PE, no infectious process or volume overload, 2-D echo with evidence of grade 1 diastolic dysfunction, this is the most likely contributing factor. - We'll discharge on oxygen.  Acute diastolic CHF - Patient with mild pedal edema, 2-D echo with preserved EF, but  a grade 1 diastolic dysfunction, kept on by mouth diuresis during hospital stay, received IV Lasix today, her home dose Lasix was increased on discharge from 20 to 40 mg oral daily, started to limit salt intake, and weight herself daily.   Chest pain - Nontypical, musculoskeletal, currently resolved - Initial EKG nonacute - Follow on troponin trend - Follow on 2-D echo   Elevated d-dimer: - CTA chest negative for PE - IV fluids normal saline at 100 ml/hour overnight following the recent dye load for angiogram   Chronic kidney disease stage III: Creatinine near baseline appears to be stable - Repeat BMP in a.m.  Iron deficiency anemia :  - Hemoglobin 9.1 on admission. 8.1 on discharge, most likely dilutional. Stool guaiac negative , no signs of active bleeding . Hemoccult negative - Workup significant for iron deficiency anemia she received IV iron during hospital stay, will be discharged on ferrous sulfate - As well B-12 deficiency, received B-12 injections, will be discharged on IM B-12.  Essential hypertension - Continue losartan    Diabetes mellitus type 2 uncontrolled: - Last hemoglobin A1c elevated at 10 and patient's blood glucose acutely elevated at 247 on admission. - Was on insulin sliding scale during hospital stay, resume home medication on discharge, instructed to resume metformin tomorrow as she received IV contrast  Dyslipidemia : - Patient found to have cholesterol 251, triglycerides 677, and HDL 31 when last checked earlier this month.  - continue statin  Anxiety -Continue Prozac and Klonopin prn    Discharge Condition:  stable   Follow UP  Follow-up Information    Follow up with MCKEOWN,WILLIAM DAVID, MD. Schedule an appointment as soon as possible for a visit in 1 week.   Specialty:  Internal Medicine   Why:  Posthospitalization follow-up   Contact information:   [address] [place] [address] [phone]          Discharge Instructions  and  Discharge Medications     Discharge Instructions    Discharge instructions    Complete by:  As directed   Follow with Primary MD MCKEOWN,WILLIAM DAVID, MD in 7 days   Get CBC, CMP, 2 view Chest X ray checked  by Primary MD next visit.    Activity: As tolerated with Full fall precautions use walker/cane & assistance as needed   Disposition Home   Diet: Heart Healthy , carbohydrate modified, low-sodium, with feeding assistance and aspiration precautions.  For Heart failure patients - Check your Weight same time everyday, if you gain over 2 pounds, or you develop in leg swelling, experience more  shortness of breath or chest pain, call your Primary MD immediately. Follow Cardiac Low Salt Diet and 1.5 lit/day fluid restriction.   On your next visit with your primary care physician please Get Medicines reviewed and adjusted.   Please request your Prim.MD to go over all Hospital Tests and Procedure/Radiological results at the follow up, please get all Hospital records sent to your Prim MD by signing hospital release before you go home.   If you experience worsening of your admission symptoms, develop shortness of breath, life threatening emergency, suicidal or homicidal thoughts you must seek medical attention immediately by calling 911 or calling your MD immediately  if symptoms less severe.  You Must read complete instructions/literature along with all the possible adverse reactions/side effects for all the Medicines you take and that have been prescribed to you. Take any new Medicines after you have completely understood and accpet all the possible adverse reactions/side effects.   Do not drive, operating heavy machinery, perform activities at heights, swimming or participation in water activities or provide baby sitting services if your were admitted for syncope or siezures until you have seen by Primary MD or a Neurologist and advised to do so  again.  Do not drive when taking Pain medications.    Do not take more than prescribed Pain, Sleep and Anxiety Medications  Special Instructions: If you have smoked or chewed Tobacco  in the last 2 yrs please stop smoking, stop any regular Alcohol  and or any Recreational drug use.  Wear Seat belts while driving.   Please note  You were cared for by a hospitalist during your hospital stay. If you have any questions about your discharge medications or the care you received while you were in the hospital after you are discharged, you can call the unit and asked to speak with the hospitalist on call if the hospitalist that took care of you is not available. Once you are discharged, your primary care physician will handle any further medical issues. Please note that NO REFILLS for any discharge medications will be authorized once you are discharged, as it is imperative that you return to your primary care physician (or establish a relationship with a primary care physician if you do not have one) for your aftercare needs so that they can reassess your need for medications and monitor your lab values.     Increase activity slowly    Complete by:  As directed             Medication List    TAKE these medications        clonazePAM 1 MG tablet  Commonly known as:  KLONOPIN  TAKE 1 TABLET THREE TIMES A DAY     colesevelam 625 MG tablet  Commonly known as:  WELCHOL  Take 625 mg by mouth 2 (two) times daily with a meal.     cyanocobalamin 1000 MCG/ML injection  Commonly known as:  (VITAMIN B-12)  Please take once weekly 4 weeks, then monthly thereafter.     ferrous sulfate 325 (65 FE) MG tablet  Take 1 tablet (325 mg total) by mouth 2 (two) times daily with a meal.     FLECTOR 1.3 % Ptch  Generic drug:  diclofenac  APPLY 1 PATCH EVERY 12 HOURS AS NEEDED FOR PAIN THEN OFF 12 HOURS     FLUoxetine 40 MG capsule  Commonly known as:  PROZAC  TAKE 1 CAPSULE BY MOUTH DAILY.      furosemide 40  MG tablet  Commonly known as:  LASIX  Take 1 tablet (40 mg total) by mouth daily.     gabapentin 300 MG capsule  Commonly known as:  NEURONTIN  Take 300 mg by mouth 3 (three) times daily.     glimepiride 4 MG tablet  Commonly known as:  AMARYL  TAKE 1 TABLET (4 MG TOTAL) BY MOUTH 2 (TWO) TIMES DAILY.     glucose blood test strip  Use as instructed     HYDROcodone-acetaminophen 5-325 MG tablet  Commonly known as:  NORCO/VICODIN  Take 1 tablet by mouth every 6 (six) hours as needed. for pain     losartan 50 MG tablet  Commonly known as:  COZAAR  TAKE 1 TABLET BY MOUTH DAILY FOR BLOOD PRESSURE AND KIDNEY     Magnesium 400 MG Caps  Take 1 capsule by mouth 2 (two) times daily.     meloxicam 15 MG tablet  Commonly known as:  MOBIC  TAKE 1 TABLET EVERY DAY FOR PAIN AND INFLAMATION     metFORMIN 500 MG 24 hr tablet  Commonly known as:  GLUCOPHAGE-XR  Take 2 tablets (1,000 mg total) by mouth 2 (two) times daily. Please hold today, resume tomorrow on 3/31 as he received IV contrast  Start taking on:  08/08/2015     OVER THE COUNTER MEDICATION  150 mg at bedtime. CVS acid reduced 75 mg at bedtime     rOPINIRole 3 MG tablet  Commonly known as:  REQUIP  Take 1 tablet (3 mg total) by mouth at bedtime.     Vitamin D 2000 units tablet  Take 10,000 Units by mouth daily.          Diet and Activity recommendation: See Discharge Instructions above   Consults obtained -  none   Major procedures and Radiology Reports - PLEASE review detailed and final reports for all details, in brief -     Dg Chest 2 View  08/05/2015  CLINICAL DATA:  Right side chest pain.  Shortness of Breath EXAM: CHEST  2 VIEW COMPARISON:  01/14/2011 FINDINGS: Heart is borderline in size. No confluent airspace opacities or effusions. No acute bony abnormality. IMPRESSION: No active cardiopulmonary disease. Electronically Signed   By: Rolm Baptise M.D.   On: 08/05/2015 17:01   Ct Angio Chest  Pe W/cm &/or Wo Cm  08/05/2015  CLINICAL DATA:  Acute onset of worsening shortness of breath and generalized chest tightness. Right-sided chest pain. Decreased hemoglobin. Initial encounter. EXAM: CT ANGIOGRAPHY CHEST WITH CONTRAST TECHNIQUE: Multidetector CT imaging of the chest was performed using the standard protocol during bolus administration of intravenous contrast. Multiplanar CT image reconstructions and MIPs were obtained to evaluate the vascular anatomy. CONTRAST:  80 mL of Isovue 370 IV contrast COMPARISON:  Chest radiograph performed earlier today at 4:48 p.m. FINDINGS: There is no evidence of pulmonary embolus. The lungs are essentially clear bilaterally. There is no evidence of significant focal consolidation, pleural effusion or pneumothorax. No masses are identified; no abnormal focal contrast enhancement is seen. A 1.2 cm precarinal node is noted. Visualized mediastinal nodes are otherwise unremarkable. No pericardial effusion is seen. Scattered calcification is noted along the proximal great vessels. No axillary lymphadenopathy is seen. The thyroid gland is unremarkable in appearance. The visualized portions of the liver and spleen are unremarkable. A small hiatal hernia is noted. No acute osseous abnormalities are seen. There is mild grade 1 anterolisthesis at multiple levels along the upper to mid thoracic spine.  Review of the MIP images confirms the above findings. IMPRESSION: 1. No evidence of pulmonary embolus. 2. Lungs clear bilaterally. 3. 1.2 cm precarinal node seen, of uncertain significance. 4. Small hiatal hernia noted. Electronically Signed   By: Garald Balding M.D.   On: 08/05/2015 23:47   Mr Lumbar Spine Wo Contrast  07/15/2015  CLINICAL DATA:  75 year old female with bilateral buttock pain and bilateral leg pain extending to feet. Increasing pain over the past month. Bowel incontinence for 6 months. Surgery 8-10 years ago (x2). No history of cancer. Initial encounter. EXAM: MRI  LUMBAR SPINE WITHOUT CONTRAST TECHNIQUE: Multiplanar, multisequence MR imaging of the lumbar spine was performed. No intravenous contrast was administered. COMPARISON:  05/13/2004 lumbar spine MR. FINDINGS: Last fully open disk space is labeled L5-S1. Present examination incorporates from T10-11 disc space lower sacrum. Conus L1-2 level.  No compression of the distal cord/ conus. Left renal cyst incompletely assessed on present exam. T10-11:  Mild bulge.  Mild narrowing ventral thecal sac. T11-12: Minimal to mild bulge. Minimal to mild narrowing ventral thecal sac. T12-L1: Bulge. Mild narrowing ventral thecal sac. Facet degenerative changes. Mild bilateral foraminal narrowing. L1-2: Bulge with greater extension left lateral position approaching but not compressing exiting nerve root. Facet degenerative changes. L2-3: Disc degeneration with endplate reactive changes greater on the right. Bulge. Moderate facet degenerative changes containing fluid. Ligamentum flavum hypertrophy. Multifactorial moderate spinal stenosis. Multifactorial moderate right-sided and mild-to-moderate left-sided foraminal narrowing. L3-4: Prior right hemilaminectomy. Prominent facet degenerative changes and bony overgrowth. Ligamentum flavum hypertrophy. 4 mm anterior slip L3. Prominent disc degeneration with endplate reactive changes. Posterior superior aspect of the L4 vertebra projects into the neural foramen greater on the right. Right greater left foraminal narrowing and encroachment upon exiting L3 nerve roots greater on the right. Multifactorial marked spinal stenosis and lateral recess stenosis greater on the right. L4-5: Prior left hemilaminectomy. Disc degeneration. Broad-based disc osteophyte complex greater to left. Facet degenerative changes. Multifactorial marked left-sided and mild to moderate right-sided lateral recess stenosis. Multifactorial moderate left-sided and mild-to-moderate right-sided spinal stenosis. Multifactorial  mild right-sided and moderate left-sided foraminal narrowing with mild encroachment upon the exiting left L4 nerve root. L5-S1: Marked facet degenerative changes. Fluid within the degenerated facet joints. Ligamentum flavum hypertrophy. Minimal anterior slip L5. Moderate bulge with greatest extension left foraminal/ lateral position. Mild to moderate right-sided and moderate to marked left-sided foraminal narrowing with mass effect upon the exiting left L5 nerve root. Multifactorial moderate spinal stenosis and lateral recess stenosis greater on the left. IMPRESSION: Progressive degenerative changes since 2006 MR. Summary of pertinent findings includes: L2-3 multifactorial moderate spinal stenosis, moderate right-sided and mild-to-moderate left-sided foraminal narrowing. L3-4 multifactorial marked right-sided and mild-to-moderate left-sided foraminal narrowing with mass effect upon exiting L3 nerve roots greater on the right. Multifactorial marked spinal stenosis and lateral recess stenosis greater on the right. L4-5 multifactorial marked left-sided and mild to moderate right-sided lateral recess stenosis. Multifactorial moderate left-sided and mild-to-moderate right-sided spinal stenosis. Multifactorial mild right-sided and moderate left-sided foraminal narrowing with mild encroachment upon the exiting left L4 nerve root. L5-S1 multifactorial mild to moderate right-sided and moderate to marked left-sided foraminal narrowing with mass effect upon the exiting left L5 nerve root. Multifactorial moderate spinal stenosis and lateral recess stenosis greater on the left. Please see above for further detail. Electronically Signed   By: Genia Del M.D.   On: 07/15/2015 08:29    Micro Results     No results found for this or any previous  visit (from the past 240 hour(s)).     Today   Subjective:   Janet Choi today has no headache,no chest abdominal pain,no new weakness tingling or numbness, feels much  better wants to go home today.   Objective:   Blood pressure 152/65, pulse 78, temperature 98.4 F (36.9 C), temperature source Oral, resp. rate 18, height 5\' 2"  (1.575 m), weight 97.614 kg (215 lb 3.2 oz), SpO2 92 %.   Intake/Output Summary (Last 24 hours) at 08/07/15 1337 Last data filed at 08/06/15 1730  Gross per 24 hour  Intake    240 ml  Output      0 ml  Net    240 ml    Exam Awake Alert, Oriented x 3, No new F.N deficits, Normal affect [place].AT,PERRAL Supple Neck,No JVD, No cervical lymphadenopathy appriciated.  Symmetrical Chest wall movement, Good air movement bilaterally, CTAB RRR,No Gallops,Rubs or new Murmurs, No Parasternal Heave +ve B.Sounds, Abd Soft, Non tender, No organomegaly appriciated, No rebound -guarding or rigidity. No Cyanosis, Clubbing or edema, No new Rash or bruise  Data Review   CBC w Diff: Lab Results  Component Value Date   WBC 4.0 08/07/2015   WBC 5.0 05/15/2009   HGB 8.1* 08/07/2015   HGB 9.1* 05/15/2009   HCT 28.0* 08/07/2015   HCT 31.6* 05/15/2009   PLT 216 08/07/2015   PLT 243 05/15/2009   LYMPHOPCT 33 08/06/2015   LYMPHOPCT 23.2 05/15/2009   MONOPCT 7 08/06/2015   MONOPCT 5.0 05/15/2009   EOSPCT 6 08/06/2015   EOSPCT 2.2 05/15/2009   BASOPCT 1 08/06/2015   BASOPCT 0.6 05/15/2009    CMP: Lab Results  Component Value Date   NA 138 08/07/2015   K 4.3 08/07/2015   CL 105 08/07/2015   CO2 24 08/07/2015   BUN 20 08/07/2015   CREATININE 1.21* 08/07/2015   CREATININE 1.09* 07/21/2015   PROT 6.0* 07/21/2015   ALBUMIN 3.8 07/21/2015   BILITOT 0.2 07/21/2015   ALKPHOS 61 07/21/2015   AST 9* 07/21/2015   ALT 8 07/21/2015  .   Total Time in preparing paper work, data evaluation and todays exam - 35 minutes  ELGERGAWY, DAWOOD M.D on 08/07/2015 at 1:37 PM  [facility]   Office  [phone]

## 2015-08-08 ENCOUNTER — Ambulatory Visit: Payer: Self-pay | Admitting: Internal Medicine

## 2015-08-11 DIAGNOSIS — M542 Cervicalgia: Secondary | ICD-10-CM | POA: Diagnosis not present

## 2015-08-11 DIAGNOSIS — M5416 Radiculopathy, lumbar region: Secondary | ICD-10-CM | POA: Diagnosis not present

## 2015-08-12 ENCOUNTER — Other Ambulatory Visit: Payer: Self-pay | Admitting: Internal Medicine

## 2015-08-13 DIAGNOSIS — M542 Cervicalgia: Secondary | ICD-10-CM | POA: Diagnosis not present

## 2015-08-13 DIAGNOSIS — M5416 Radiculopathy, lumbar region: Secondary | ICD-10-CM | POA: Diagnosis not present

## 2015-08-15 ENCOUNTER — Encounter: Payer: Self-pay | Admitting: Internal Medicine

## 2015-08-15 ENCOUNTER — Ambulatory Visit (INDEPENDENT_AMBULATORY_CARE_PROVIDER_SITE_OTHER): Payer: PPO | Admitting: Internal Medicine

## 2015-08-15 VITALS — BP 120/78 | HR 76 | Temp 97.3°F | Resp 16 | Ht 62.5 in | Wt 216.8 lb

## 2015-08-15 DIAGNOSIS — N183 Chronic kidney disease, stage 3 unspecified: Secondary | ICD-10-CM

## 2015-08-15 DIAGNOSIS — D509 Iron deficiency anemia, unspecified: Secondary | ICD-10-CM | POA: Diagnosis not present

## 2015-08-15 DIAGNOSIS — E1140 Type 2 diabetes mellitus with diabetic neuropathy, unspecified: Secondary | ICD-10-CM

## 2015-08-15 DIAGNOSIS — Z79899 Other long term (current) drug therapy: Secondary | ICD-10-CM | POA: Diagnosis not present

## 2015-08-15 DIAGNOSIS — E1122 Type 2 diabetes mellitus with diabetic chronic kidney disease: Secondary | ICD-10-CM

## 2015-08-15 DIAGNOSIS — E1129 Type 2 diabetes mellitus with other diabetic kidney complication: Secondary | ICD-10-CM | POA: Diagnosis not present

## 2015-08-15 DIAGNOSIS — I10 Essential (primary) hypertension: Secondary | ICD-10-CM

## 2015-08-15 LAB — CBC WITH DIFFERENTIAL/PLATELET
BASOS ABS: 47 {cells}/uL (ref 0–200)
Basophils Relative: 1 %
EOS PCT: 6 %
Eosinophils Absolute: 282 cells/uL (ref 15–500)
HCT: 33.3 % — ABNORMAL LOW (ref 35.0–45.0)
HEMOGLOBIN: 10 g/dL — AB (ref 11.7–15.5)
LYMPHS ABS: 893 {cells}/uL (ref 850–3900)
LYMPHS PCT: 19 %
MCH: 24.5 pg — AB (ref 27.0–33.0)
MCHC: 30 g/dL — AB (ref 32.0–36.0)
MCV: 81.6 fL (ref 80.0–100.0)
MPV: 9.8 fL (ref 7.5–12.5)
Monocytes Absolute: 188 cells/uL — ABNORMAL LOW (ref 200–950)
Monocytes Relative: 4 %
NEUTROS PCT: 70 %
Neutro Abs: 3290 cells/uL (ref 1500–7800)
Platelets: 242 10*3/uL (ref 140–400)
RBC: 4.08 MIL/uL (ref 3.80–5.10)
RDW: 22.2 % — AB (ref 11.0–15.0)
WBC: 4.7 10*3/uL (ref 3.8–10.8)

## 2015-08-15 MED ORDER — GABAPENTIN 600 MG PO TABS: ORAL_TABLET | ORAL | Status: DC

## 2015-08-15 NOTE — Progress Notes (Signed)
Subjective:    Patient ID: Janet Choi, female    DOB: July 02, 1940, 75 y.o.   MRN: [id number]  HPI  Thi8s very nice , but poorly compliant morbidly obese DWF was recently hospitalized 3/28-3/30 with Hypoxia and severe anemia and was given IV iron and she persists with oral iro supplements. She was d/c'd home on O2 which she has tapered off reporting O2 sats are staying about 92%.  Patient was gently diuresed with iv Lasixansd 2 D cardiac echo showed grade 1 diastolic dysfunction, CTA scan of lung found no pulm emboli and CXR showed no heart failure. CBC's showed Hgb had dropped from 9.1 to 8.1 during hospitalization despite diuresis. Hyperglycemia hallmarking her chronic poor compliance and control was covered by SSI.   Medication Sig  . VITAMIN D 2000 UNITS Take 10,000 Units by mouth daily.   . clonazePAM  1 MG tablet TAKE 1 TABLET THREE TIMES A DAY  . colesevelam  625 MG tablet Take 625 mg by mouth 2 (two) times daily with a meal.  . VITAMIN B-12 1000 MCG/ML inj Please take once weekly 4 weeks, then monthly thereafter.  . ferrous sulfate 325 MG tablet Take 1 tablet (325 mg total) by mouth 2 (two) times daily with a meal.  . FLECTOR 1.3 % PTCH APPLY 1 PATCH EVERY 12 HOURS AS NEEDED FOR PAIN THEN OFF 12 HOURS  . FLUoxetine 40 MG capsule TAKE 1 CAPSULE BY MOUTH DAILY.  . furosemide (LASIX) 40 MG tablet TAKE 1 TABLET 3 TIMES A DAY BEFORE MEALS FOR EDEMA ( STOP TAKING HCTZ)  . gabapentin  300 MG capsule Take 300 mg by mouth 3 (three) times daily.   Marland Kitchen glimepiride (AMARYL) 4 MG tablet TAKE 1 TABLET (4 MG TOTAL) BY MOUTH 2 (TWO) TIMES DAILY.   Marland Kitchen NORCO 5-325 MG  Take 1 tablet by mouth every 6 (six) hours as needed. for pain  . losartan  50 MG tablet TAKE 1 TABLET BY MOUTH DAILY FOR BLOOD PRESSURE AND KIDNEY  . Magnesium 400 MG CAPS Take 1 capsule by mouth 2 (two) times daily.   . meloxicam  15 MG tablet TAKE 1 TABLET EVERY DAY FOR PAIN AND INFLAMATION  . metFORMIN (GLUCOPHAGE-XR) 500 MG 24 hr tablet  Take 2 tablets (1,000 mg total) by mouth 2 (two) times daily. Please hold today, resume tomorrow on 3/31 as he received IV contrast  . OVER THE COUNTER MEDICATION 150 mg at bedtime. CVS acid reduced 75 mg at bedtime  . rOPINIRole (REQUIP) 3 MG tablet Take 1 tablet (3 mg total) by mouth at bedtime.   Allergies  Allergen Reactions  . Aspirin     Large doses per patient  . Crestor [Rosuvastatin]     Leg crams  . Lipitor [Atorvastatin]     Leg crams  . Lyrica [Pregabalin] Other (See Comments)    Blurred vision  . Minocycline Hcl     REACTION: Reaction not known  . Oruvail [Ketoprofen]   . Tricor [Fenofibrate]   . Vasotec [Enalaprilat]   . Victoza [Liraglutide]   . Zinc    Past Medical History  Diagnosis Date  . Diabetes mellitus without complication ([place])   . Hypertension   . Hyperlipidemia   . OSA (obstructive sleep apnea)   . DJD (degenerative joint disease)   . RLS (restless legs syndrome)   . Obesity   . Vitamin D deficiency   . Anxiety   . IBS (irritable bowel syndrome)    Past Surgical  History  Procedure Laterality Date  . Total knee arthroplasty Bilateral   . Cataract extraction Bilateral   . Rotator cuff repair Bilateral   . Lumbar disc surgery      x 2  . Appendectomy     Review of Systems  10 point systems review negative except as above.     Objective:   Physical Exam  BP 120/78 mmHg  Pulse 76  Temp(Src) 97.3 F (36.3 C)  Resp 16  Ht 5' 2.5" (1.588 m)  Wt 216 lb 12.8 oz (98.34 kg)  BMI 39.00 kg/m2  Morbidly Obese.  HEENT - Eac's patent. TM's Nl. EOM's full. PERRLA. NasoOroPharynx clear. Neck - supple. Nl Thyroid. Carotids 2+ & No bruits, nodes, JVD Chest - Clear equal BS w/o Rales, rhonchi, wheezes. Cor - Nl HS. RRR w/o sig MGR. PP 1(+). No edema. Abd - No palpable organomegaly, masses or tenderness. BS nl. MS- FROM w/o deformities. Muscle power, tone and bulk Nl. Gait Nl. Neuro - No obvious Cr N abnormalities. Sensory, motor and Cerebellar  functions appear Nl w/o focal abnormalities. Psyche - Mental status normal & appropriate.  No delusions, ideations or obvious mood abnormalities.    Assessment & Plan:   1. Essential hypertension   2. Iron deficiency anemia  - CBC with Differential/Platelet - Iron and TIBC  3. Controlled type 2 diabetes mellitus with stage 3 chronic kidney disease, without long-term current use of insulin (HCC)  - BASIC METABOLIC PANEL WITH GFR - long discussion re: importance of better diet & weight loss  4. Medication management  - CBC with Differential/Platelet - BASIC METABOLIC PANEL WITH GFR - Iron and TIBC

## 2015-08-16 LAB — BASIC METABOLIC PANEL WITH GFR
BUN: 26 mg/dL — ABNORMAL HIGH (ref 7–25)
CO2: 20 mmol/L (ref 20–31)
CREATININE: 1.15 mg/dL — AB (ref 0.60–0.93)
Calcium: 9.3 mg/dL (ref 8.6–10.4)
Chloride: 101 mmol/L (ref 98–110)
GFR, EST AFRICAN AMERICAN: 54 mL/min — AB (ref >=60)
GFR, EST NON AFRICAN AMERICAN: 47 mL/min — AB (ref >=60)
GLUCOSE: 377 mg/dL — AB (ref 65–99)
Potassium: 4.8 mmol/L (ref 3.5–5.3)
SODIUM: 136 mmol/L (ref 135–146)

## 2015-08-16 LAB — IRON AND TIBC
%SAT: 12 % (ref 11–50)
Iron: 52 ug/dL (ref 45–160)
TIBC: 418 ug/dL (ref 250–450)
UIBC: 366 ug/dL (ref 125–400)

## 2015-08-19 DIAGNOSIS — M5416 Radiculopathy, lumbar region: Secondary | ICD-10-CM | POA: Diagnosis not present

## 2015-08-19 DIAGNOSIS — M542 Cervicalgia: Secondary | ICD-10-CM | POA: Diagnosis not present

## 2015-08-21 DIAGNOSIS — M542 Cervicalgia: Secondary | ICD-10-CM | POA: Diagnosis not present

## 2015-08-21 DIAGNOSIS — M5416 Radiculopathy, lumbar region: Secondary | ICD-10-CM | POA: Diagnosis not present

## 2015-08-26 DIAGNOSIS — M542 Cervicalgia: Secondary | ICD-10-CM | POA: Diagnosis not present

## 2015-08-26 DIAGNOSIS — M5416 Radiculopathy, lumbar region: Secondary | ICD-10-CM | POA: Diagnosis not present

## 2015-08-27 ENCOUNTER — Ambulatory Visit (INDEPENDENT_AMBULATORY_CARE_PROVIDER_SITE_OTHER): Payer: PPO | Admitting: Internal Medicine

## 2015-08-27 ENCOUNTER — Encounter: Payer: Self-pay | Admitting: Internal Medicine

## 2015-08-27 VITALS — BP 124/62 | HR 80 | Temp 97.8°F | Resp 16 | Ht 62.5 in | Wt 210.0 lb

## 2015-08-27 DIAGNOSIS — Z79899 Other long term (current) drug therapy: Secondary | ICD-10-CM | POA: Diagnosis not present

## 2015-08-27 DIAGNOSIS — E039 Hypothyroidism, unspecified: Secondary | ICD-10-CM | POA: Diagnosis not present

## 2015-08-27 DIAGNOSIS — R5382 Chronic fatigue, unspecified: Secondary | ICD-10-CM | POA: Diagnosis not present

## 2015-08-27 DIAGNOSIS — D509 Iron deficiency anemia, unspecified: Secondary | ICD-10-CM

## 2015-08-27 NOTE — Progress Notes (Signed)
Subjective:    Patient ID: Janet Choi, female    DOB: [birth year], 75 y.o.   MRN: [id number]  HPI  Patient presents to the office for malaise and fatigue x 2-3 weeks.  She reports that she has been taking her iron pills daily.  She did have an improvement on her hemoglobin on 08/18/15 to 10.0.  She has cut back on her gabapentin which has helped her feel better and less fatigued.  She has been taking tylenol, but has not been taking much hydrocodone.  She is getting epidural injection in 2 days.  She reports that she is just really tired.  Her appetite has decreased.  She reports that her blood sugar monitor has not been working at home.  She reports that she didn't get anything done in the morning.  She has not been using her sleep apnea machine at home.  She is sleeping 10-11 hours per day.    Review of Systems  Constitutional: Positive for fatigue. Negative for fever and chills.  HENT: Negative for congestion, ear pain, nosebleeds, postnasal drip, rhinorrhea and sore throat.   Respiratory: Negative for cough, chest tightness, shortness of breath and wheezing.   Cardiovascular: Negative.   Gastrointestinal: Negative for nausea, vomiting, diarrhea, constipation, blood in stool and abdominal distention.  Endocrine: Negative.   Neurological: Negative for dizziness, weakness, light-headedness and numbness.  Psychiatric/Behavioral: Positive for sleep disturbance, dysphoric mood and decreased concentration. Negative for confusion and agitation. The patient is nervous/anxious.        Objective:   Physical Exam  Constitutional: She is oriented to person, place, and time. She appears well-developed and well-nourished. No distress.  HENT:  Head: Normocephalic.  Mouth/Throat: Oropharynx is clear and moist. No oropharyngeal exudate.  Eyes: Conjunctivae are normal. No scleral icterus.  Neck: Normal range of motion. Neck supple. No JVD present. No thyromegaly present.  Cardiovascular: Normal rate,  regular rhythm and intact distal pulses.  Exam reveals no gallop and no friction rub.   No murmur heard. Pulmonary/Chest: Effort normal and breath sounds normal. No respiratory distress. She has no wheezes. She has no rales. She exhibits no tenderness.  Abdominal: Soft. Bowel sounds are normal. She exhibits no distension and no mass. There is no tenderness. There is no rebound and no guarding.  Musculoskeletal: Normal range of motion.  Lymphadenopathy:    She has no cervical adenopathy.  Neurological: She is alert and oriented to person, place, and time. No cranial nerve deficit. Coordination normal.  Skin: Skin is warm and dry. She is not diaphoretic.  Psychiatric: Her speech is normal. Judgment and thought content normal. She is slowed and withdrawn. Cognition and memory are normal. She exhibits a depressed mood. She expresses no homicidal and no suicidal ideation. She expresses no suicidal plans and no homicidal plans.  Nursing note and vitals reviewed.    Filed Vitals:   08/27/15 1450  BP: 124/62  Pulse: 80  Temp: 97.8 F (36.6 C)  Resp: 16          Assessment & Plan:   Fatigue is likely multifactorial at this time secondary to severe iron def anemia which has improved, but has not returned to normal, severe generalized anxiety which she is currently refusing wellbutrin and counseling for, and very poor compliance with CPAP machine.  Recommended wearing CPAP.  We also taught how to use trulicity pen. If labs have improved we will consider starting on pen.  Also wanted to check on kidney function with increased  lasix use prior to start.  She is a severe uncontrolled diabetic which can also contribute to fatigue.  May be to her benefit to see cards for a possible stress test/echo to rule out congestive heart failure as well.    1. Medication management  - CBC with Differential/Platelet - BASIC METABOLIC PANEL WITH GFR - Hepatic function panel  2. Hypothyroidism, unspecified  hypothyroidism type  - TSH  3. Anemia, iron deficiency - Iron and TIBC - Ambulatory referral to Hematology

## 2015-08-28 DIAGNOSIS — M4806 Spinal stenosis, lumbar region: Secondary | ICD-10-CM | POA: Diagnosis not present

## 2015-08-28 LAB — BASIC METABOLIC PANEL WITH GFR
BUN: 31 mg/dL — ABNORMAL HIGH (ref 7–25)
CHLORIDE: 93 mmol/L — AB (ref 98–110)
CO2: 23 mmol/L (ref 20–31)
Calcium: 9.4 mg/dL (ref 8.6–10.4)
Creat: 1.48 mg/dL — ABNORMAL HIGH (ref 0.60–0.93)
GFR, EST AFRICAN AMERICAN: 40 mL/min — AB (ref >=60)
GFR, EST NON AFRICAN AMERICAN: 35 mL/min — AB (ref >=60)
Glucose, Bld: 309 mg/dL — ABNORMAL HIGH (ref 65–99)
POTASSIUM: 3.8 mmol/L (ref 3.5–5.3)
SODIUM: 136 mmol/L (ref 135–146)

## 2015-08-28 LAB — HEPATIC FUNCTION PANEL
ALBUMIN: 4.3 g/dL (ref 3.6–5.1)
ALK PHOS: 68 U/L (ref 33–130)
ALT: 8 U/L (ref 6–29)
AST: 9 U/L — AB (ref 10–35)
BILIRUBIN TOTAL: 0.3 mg/dL (ref 0.2–1.2)
Total Protein: 6.8 g/dL (ref 6.1–8.1)

## 2015-08-28 LAB — CBC WITH DIFFERENTIAL/PLATELET
BASOS PCT: 0 %
Basophils Absolute: 0 cells/uL (ref 0–200)
EOS ABS: 275 {cells}/uL (ref 15–500)
EOS PCT: 5 %
HCT: 37.2 % (ref 35.0–45.0)
Hemoglobin: 11.6 g/dL — ABNORMAL LOW (ref 11.7–15.5)
LYMPHS PCT: 27 %
Lymphs Abs: 1485 cells/uL (ref 850–3900)
MCH: 25.8 pg — ABNORMAL LOW (ref 27.0–33.0)
MCHC: 31.2 g/dL — ABNORMAL LOW (ref 32.0–36.0)
MCV: 82.9 fL (ref 80.0–100.0)
MONOS PCT: 6 %
MPV: 9.9 fL (ref 7.5–12.5)
Monocytes Absolute: 330 cells/uL (ref 200–950)
NEUTROS ABS: 3410 {cells}/uL (ref 1500–7800)
Neutrophils Relative %: 62 %
PLATELETS: 241 10*3/uL (ref 140–400)
RBC: 4.49 MIL/uL (ref 3.80–5.10)
RDW: 22.5 % — AB (ref 11.0–15.0)
WBC: 5.5 10*3/uL (ref 3.8–10.8)

## 2015-08-28 LAB — IRON AND TIBC
%SAT: 12 % (ref 11–50)
Iron: 50 ug/dL (ref 45–160)
TIBC: 419 ug/dL (ref 250–450)
UIBC: 369 ug/dL (ref 125–400)

## 2015-08-28 LAB — TSH: TSH: 0.7 mIU/L

## 2015-08-30 DIAGNOSIS — G4733 Obstructive sleep apnea (adult) (pediatric): Secondary | ICD-10-CM | POA: Diagnosis not present

## 2015-08-31 ENCOUNTER — Other Ambulatory Visit: Payer: Self-pay | Admitting: Internal Medicine

## 2015-09-02 DIAGNOSIS — C44612 Basal cell carcinoma of skin of right upper limb, including shoulder: Secondary | ICD-10-CM | POA: Diagnosis not present

## 2015-09-02 DIAGNOSIS — Z85820 Personal history of malignant melanoma of skin: Secondary | ICD-10-CM | POA: Diagnosis not present

## 2015-09-02 DIAGNOSIS — M5416 Radiculopathy, lumbar region: Secondary | ICD-10-CM | POA: Diagnosis not present

## 2015-09-02 DIAGNOSIS — L821 Other seborrheic keratosis: Secondary | ICD-10-CM | POA: Diagnosis not present

## 2015-09-02 DIAGNOSIS — C4441 Basal cell carcinoma of skin of scalp and neck: Secondary | ICD-10-CM | POA: Diagnosis not present

## 2015-09-02 DIAGNOSIS — Z85828 Personal history of other malignant neoplasm of skin: Secondary | ICD-10-CM | POA: Diagnosis not present

## 2015-09-02 DIAGNOSIS — C44519 Basal cell carcinoma of skin of other part of trunk: Secondary | ICD-10-CM | POA: Diagnosis not present

## 2015-09-02 DIAGNOSIS — L814 Other melanin hyperpigmentation: Secondary | ICD-10-CM | POA: Diagnosis not present

## 2015-09-02 DIAGNOSIS — C44319 Basal cell carcinoma of skin of other parts of face: Secondary | ICD-10-CM | POA: Diagnosis not present

## 2015-09-02 DIAGNOSIS — M542 Cervicalgia: Secondary | ICD-10-CM | POA: Diagnosis not present

## 2015-09-02 DIAGNOSIS — L570 Actinic keratosis: Secondary | ICD-10-CM | POA: Diagnosis not present

## 2015-09-04 DIAGNOSIS — M5416 Radiculopathy, lumbar region: Secondary | ICD-10-CM | POA: Diagnosis not present

## 2015-09-04 DIAGNOSIS — M542 Cervicalgia: Secondary | ICD-10-CM | POA: Diagnosis not present

## 2015-09-05 ENCOUNTER — Ambulatory Visit: Payer: Self-pay | Admitting: Internal Medicine

## 2015-09-07 DIAGNOSIS — R0902 Hypoxemia: Secondary | ICD-10-CM | POA: Diagnosis not present

## 2015-09-07 DIAGNOSIS — G4733 Obstructive sleep apnea (adult) (pediatric): Secondary | ICD-10-CM | POA: Diagnosis not present

## 2015-09-09 ENCOUNTER — Other Ambulatory Visit: Payer: Self-pay | Admitting: Internal Medicine

## 2015-09-10 ENCOUNTER — Encounter: Payer: Self-pay | Admitting: Internal Medicine

## 2015-09-10 ENCOUNTER — Ambulatory Visit (INDEPENDENT_AMBULATORY_CARE_PROVIDER_SITE_OTHER): Payer: PPO | Admitting: Internal Medicine

## 2015-09-10 VITALS — BP 120/64 | HR 82 | Temp 98.0°F | Resp 16 | Ht 62.5 in | Wt 213.0 lb

## 2015-09-10 DIAGNOSIS — M150 Primary generalized (osteo)arthritis: Secondary | ICD-10-CM | POA: Diagnosis not present

## 2015-09-10 DIAGNOSIS — E1165 Type 2 diabetes mellitus with hyperglycemia: Secondary | ICD-10-CM

## 2015-09-10 DIAGNOSIS — D509 Iron deficiency anemia, unspecified: Secondary | ICD-10-CM | POA: Diagnosis not present

## 2015-09-10 DIAGNOSIS — M159 Polyosteoarthritis, unspecified: Secondary | ICD-10-CM

## 2015-09-10 MED ORDER — INSULIN ISOPHANE & REGULAR (HUMAN 70-30)100 UNIT/ML KWIKPEN: PEN_INJECTOR | SUBCUTANEOUS | Status: DC

## 2015-09-10 MED ORDER — HYDROCODONE-ACETAMINOPHEN 5-325 MG PO TABS: 1.0000 | ORAL_TABLET | Freq: Four times a day (QID) | ORAL | Status: DC | PRN

## 2015-09-10 NOTE — Patient Instructions (Addendum)
Stop the glimeperide, meloxicam, and the metformin.  Please start using the insulin in the mornings with breakfast and the evenings with dinner.  Please do not give yourself insulin if your blood sugar is less than 120.  Please check your blood sugars first thing in the morning, before or after lunch, and either at dinner time or bedtime.    Please call the office if your blood sugars are dropping less than 70.  Please ask the pharmacist to demonstrate your insulin use for you.

## 2015-09-10 NOTE — Progress Notes (Signed)
Assessment and Plan:   1. Type 2 diabetes mellitus with hyperglycemia, unspecified long term insulin use status (HCC) -stop metformin -stop glipizide -start insulin 70/30 pen 15 units in am and 10 units in PM -CBGs 3 times daily -recheck in 2 weeks -if patient unsatisfied in 2 weeks she would like to go see endocrine  2. Anemia, iron deficiency -cont iron -stop metformin -stop mobic -get colonoscopy as planned with Dr. Loletha Carrow at [place]  3.  Degenerative joint disease -stop mobic -given stopping mobic will give hydrocodone as needed for short term use     HPI 75 y.o.female presents for 1 month follow up of severe fatigue, uncontrolled diabetes, and severe iron deficiency anemia. Patient reports that she has not been doing well since her last visit.  She is extremely fatigued and is sleeping a lot.  She reports that she went to a church retreat and she slept nearly the whole time. She reports that she did get an epidural shot and finished PT.  She reports that she is having good days and bad days where she sometimes feels like a zombie.  She reports that she has been drinking a lot of water and has been urinating a lot.    female is taking their medications for diabetes and has been cutting back on a lot of her eating, but even without eating for 24 hours the lowest that her sugar gets down to is 160.  She reports that she does not want to keep feeling this bad.  Patient is also wondering why she has not seen the hematologist at this time.   A referral to hematology was placed and Dr. Melford Aase spoke with Dr. Benay Spice who felt the patient did not need to be seen as blood work indicates that this is being caused by iron deficiency and that he recommended stopping metformin and also meloxicam.  They are not having difficulty with their medications.  They report no adverse reactions. She does not that since her last visit she has seen the dermatologist who took off several places with no  diagnosis of cancer.     Past Medical History  Diagnosis Date  . Diabetes mellitus without complication ([place])   . Hypertension   . Hyperlipidemia   . OSA (obstructive sleep apnea)   . DJD (degenerative joint disease)   . RLS (restless legs syndrome)   . Obesity   . Vitamin D deficiency   . Anxiety   . IBS (irritable bowel syndrome)      Allergies  Allergen Reactions  . Aspirin     Large doses per patient  . Crestor [Rosuvastatin]     Leg crams  . Lipitor [Atorvastatin]     Leg crams  . Lyrica [Pregabalin] Other (See Comments)    Blurred vision  . Minocycline Hcl     REACTION: Reaction not known  . Oruvail [Ketoprofen]   . Tricor [Fenofibrate]   . Vasotec [Enalaprilat]   . Victoza [Liraglutide]   . Zinc       Current Outpatient Prescriptions on File Prior to Visit  Medication Sig Dispense Refill  . Cholecalciferol (VITAMIN D) 2000 UNITS tablet Take 10,000 Units by mouth daily.     . clonazePAM (KLONOPIN) 1 MG tablet TAKE 1 TABLET THREE TIMES A DAY 90 tablet 0  . colesevelam (WELCHOL) 625 MG tablet Take 625 mg by mouth 2 (two) times daily with a meal.    . cyanocobalamin (,VITAMIN B-12,) 1000 MCG/ML injection Please take once  weekly 4 weeks, then monthly thereafter. 1 mL 10  . ferrous sulfate 325 (65 FE) MG tablet Take 1 tablet (325 mg total) by mouth 2 (two) times daily with a meal. 60 tablet 3  . FLECTOR 1.3 % PTCH APPLY 1 PATCH EVERY 12 HOURS AS NEEDED FOR PAIN THEN OFF 12 HOURS  0  . FLUoxetine (PROZAC) 40 MG capsule TAKE 1 CAPSULE BY MOUTH DAILY. 90 capsule 1  . furosemide (LASIX) 40 MG tablet TAKE 1 TABLET 3 TIMES A DAY BEFORE MEALS FOR EDEMA ( STOP TAKING HCTZ) 90 tablet 1  . gabapentin (NEURONTIN) 600 MG tablet Take 1/2 to 1 tablet 3 x /daily for Pain. 270 tablet 1  . glimepiride (AMARYL) 4 MG tablet TAKE 1 TABLET BY MOUTH 2 TIMES DAILY. 180 tablet 3  . glucose blood test strip Use as instructed 100 each 0  . HYDROcodone-acetaminophen (NORCO/VICODIN) 5-325 MG  tablet Take 1 tablet by mouth every 6 (six) hours as needed. for pain  0  . losartan (COZAAR) 50 MG tablet TAKE 1 TABLET BY MOUTH DAILY FOR BLOOD PRESSURE AND KIDNEY 90 tablet 2  . Magnesium 400 MG CAPS Take 1 capsule by mouth 2 (two) times daily.     . meloxicam (MOBIC) 15 MG tablet TAKE 1 TABLET EVERY DAY FOR PAIN AND INFLAMATION 90 tablet 1  . metFORMIN (GLUCOPHAGE-XR) 500 MG 24 hr tablet Take 2 tablets (1,000 mg total) by mouth 2 (two) times daily. Please hold today, resume tomorrow on 3/31 as he received IV contrast 180 tablet 1  . OVER THE COUNTER MEDICATION 150 mg at bedtime. [facility] acid reduced 75 mg at bedtime    . rOPINIRole (REQUIP) 3 MG tablet Take 1 tablet (3 mg total) by mouth at bedtime. 120 tablet 5   No current facility-administered medications on file prior to visit.    ROS: all negative except above.   Physical Exam: Filed Weights   09/10/15 1449  Weight: 213 lb (96.616 kg)   BP 120/64 mmHg  Pulse 82  Temp(Src) 98 F (36.7 C) (Temporal)  Resp 16  Ht 5' 2.5" (1.588 m)  Wt 213 lb (96.616 kg)  BMI 38.31 kg/m2  SpO2 93% General Appearance: Well developed well nourished, Chronically ill appearing non-toxic appearing in no apparent distress. Eyes: PERRLA, EOMs, conjunctiva w/ no swelling or erythema or discharge Sinuses: No Frontal/maxillary tenderness ENT/Mouth: Ear canals clear without swelling or erythema.  TM's normal bilaterally with no retractions, bulging, or loss of landmarks.   Neck: Supple, thyroid normal, no notable JVD  Respiratory: Respiratory effort normal, Clear breath sounds anteriorly and posteriorly bilaterally without rales, rhonchi, wheezing or stridor. No retractions or accessory muscle usage. Cardio: RRR with no MRGs.   Abdomen: Soft, + BS.  Non tender, no guarding, rebound, hernias, masses.  Musculoskeletal: Full ROM, 5/5 strength, normal gait.  Skin: Warm, dry without rashes.  Biopsy sight on left leg at midshin with no redness, warmth or  drainage.    Neuro: Awake and oriented X 3, Cranial nerves intact. Normal muscle tone, no cerebellar symptoms. Sensation intact.  Psych: Flat affect, withdrawn, Insight and Judgment appropriate.     [name], PA-C 3:07 PM [facility]

## 2015-09-23 ENCOUNTER — Ambulatory Visit: Payer: Self-pay | Admitting: Internal Medicine

## 2015-09-26 ENCOUNTER — Ambulatory Visit (INDEPENDENT_AMBULATORY_CARE_PROVIDER_SITE_OTHER): Payer: PPO | Admitting: Internal Medicine

## 2015-09-26 ENCOUNTER — Encounter: Payer: Self-pay | Admitting: Internal Medicine

## 2015-09-26 VITALS — BP 106/58 | HR 78 | Temp 98.2°F | Resp 18 | Ht 62.5 in | Wt 211.0 lb

## 2015-09-26 DIAGNOSIS — D509 Iron deficiency anemia, unspecified: Secondary | ICD-10-CM | POA: Diagnosis not present

## 2015-09-26 DIAGNOSIS — E1122 Type 2 diabetes mellitus with diabetic chronic kidney disease: Secondary | ICD-10-CM | POA: Diagnosis not present

## 2015-09-26 DIAGNOSIS — N183 Chronic kidney disease, stage 3 (moderate): Secondary | ICD-10-CM | POA: Diagnosis not present

## 2015-09-26 DIAGNOSIS — E785 Hyperlipidemia, unspecified: Secondary | ICD-10-CM | POA: Diagnosis not present

## 2015-09-26 DIAGNOSIS — E1129 Type 2 diabetes mellitus with other diabetic kidney complication: Secondary | ICD-10-CM | POA: Diagnosis not present

## 2015-09-26 LAB — CBC WITH DIFFERENTIAL/PLATELET
BASOS PCT: 0 %
Basophils Absolute: 0 cells/uL (ref 0–200)
EOS ABS: 124 {cells}/uL (ref 15–500)
Eosinophils Relative: 4 %
HEMATOCRIT: 38.9 % (ref 35.0–45.0)
Hemoglobin: 12.2 g/dL (ref 11.7–15.5)
Lymphocytes Relative: 32 %
Lymphs Abs: 992 cells/uL (ref 850–3900)
MCH: 27.6 pg (ref 27.0–33.0)
MCHC: 31.4 g/dL — ABNORMAL LOW (ref 32.0–36.0)
MCV: 88 fL (ref 80.0–100.0)
MONO ABS: 186 {cells}/uL — AB (ref 200–950)
MONOS PCT: 6 %
MPV: 9.7 fL (ref 7.5–12.5)
NEUTROS ABS: 1798 {cells}/uL (ref 1500–7800)
Neutrophils Relative %: 58 %
PLATELETS: 195 10*3/uL (ref 140–400)
RBC: 4.42 MIL/uL (ref 3.80–5.10)
RDW: 18 % — ABNORMAL HIGH (ref 11.0–15.0)
WBC: 3.1 10*3/uL — ABNORMAL LOW (ref 3.8–10.8)

## 2015-09-26 LAB — BASIC METABOLIC PANEL WITH GFR
BUN: 18 mg/dL (ref 7–25)
CHLORIDE: 98 mmol/L (ref 98–110)
CO2: 24 mmol/L (ref 20–31)
Calcium: 9 mg/dL (ref 8.6–10.4)
Creat: 1.19 mg/dL — ABNORMAL HIGH (ref 0.60–0.93)
GFR, Est African American: 52 mL/min — ABNORMAL LOW (ref >=60)
GFR, Est Non African American: 45 mL/min — ABNORMAL LOW (ref >=60)
Glucose, Bld: 444 mg/dL — ABNORMAL HIGH (ref 65–99)
POTASSIUM: 4.7 mmol/L (ref 3.5–5.3)
Sodium: 134 mmol/L — ABNORMAL LOW (ref 135–146)

## 2015-09-26 LAB — LIPID PANEL
CHOL/HDL RATIO: 8.8 ratio — AB (ref <=5.0)
CHOLESTEROL: 308 mg/dL — AB (ref 125–200)
HDL: 35 mg/dL — AB (ref >=46)
Triglycerides: 675 mg/dL — ABNORMAL HIGH (ref <150)

## 2015-09-26 NOTE — Progress Notes (Signed)
Assessment and Plan:   1. Iron deficiency anemia  - CBC with Differential/Platelet - BASIC METABOLIC PANEL WITH GFR  2. Controlled type 2 diabetes mellitus with stage 3 chronic kidney disease, without long-term current use of insulin ([place]) -she is going to start doing the insulin today.  She is picking up the pens this afternoon -5 pens should last at least 60 days at current dose.   - Hemoglobin A1c  3. Hyperlipemia -cont welchol - Lipid panel   Depression has improved with stopping clonazepam and also stopping wellbutrin.  Will stay off medications for the time being.    HPI 75 y.o.female presents for 2 week follow up of diabetes and iron deficiency anemia. Patient reports that they have been doing slightly better.  She was not able to afford getting the insulin.  She reports that she has stopped clonazepam, she has stopped the wellbutrin.  She is not taking glipizide.  She is still taking metformin.  She is using vagasil for another yeast infection.  She thinks that this yeast infection is coming from the welchol.  Past Medical History  Diagnosis Date  . Diabetes mellitus without complication ([place])   . Hypertension   . Hyperlipidemia   . OSA (obstructive sleep apnea)   . DJD (degenerative joint disease)   . RLS (restless legs syndrome)   . Obesity   . Vitamin D deficiency   . Anxiety   . IBS (irritable bowel syndrome)      Allergies  Allergen Reactions  . Aspirin     Large doses per patient  . Crestor [Rosuvastatin]     Leg crams  . Lipitor [Atorvastatin]     Leg crams  . Lyrica [Pregabalin] Other (See Comments)    Blurred vision  . Minocycline Hcl     REACTION: Reaction not known  . Oruvail [Ketoprofen]   . Tricor [Fenofibrate]   . Vasotec [Enalaprilat]   . Victoza [Liraglutide]   . Zinc       Current Outpatient Prescriptions on File Prior to Visit  Medication Sig Dispense Refill  . Cholecalciferol (VITAMIN D) 2000 UNITS tablet Take 10,000 Units by mouth  daily.     . colesevelam (WELCHOL) 625 MG tablet Take 625 mg by mouth 2 (two) times daily with a meal.    . FLUoxetine (PROZAC) 40 MG capsule TAKE 1 CAPSULE BY MOUTH DAILY. 90 capsule 1  . furosemide (LASIX) 40 MG tablet TAKE 1 TABLET 3 TIMES A DAY BEFORE MEALS FOR EDEMA ( STOP TAKING HCTZ) 90 tablet 1  . gabapentin (NEURONTIN) 600 MG tablet Take 1/2 to 1 tablet 3 x /daily for Pain. 270 tablet 1  . glucose blood test strip Use as instructed 100 each 0  . losartan (COZAAR) 50 MG tablet TAKE 1 TABLET BY MOUTH DAILY FOR BLOOD PRESSURE AND KIDNEY 90 tablet 2  . Magnesium 400 MG CAPS Take 1 capsule by mouth 2 (two) times daily.     Marland Kitchen OVER THE COUNTER MEDICATION 150 mg at bedtime. CVS acid reduced 75 mg at bedtime    . rOPINIRole (REQUIP) 3 MG tablet Take 1 tablet (3 mg total) by mouth at bedtime. 120 tablet 5  . clonazePAM (KLONOPIN) 1 MG tablet TAKE 1 TABLET THREE TIMES A DAY (Patient not taking: Reported on 09/26/2015) 90 tablet 0  . cyanocobalamin (,VITAMIN B-12,) 1000 MCG/ML injection Please take once weekly 4 weeks, then monthly thereafter. (Patient not taking: Reported on 09/26/2015) 1 mL 10  . Insulin Isophane &  Regular Human (HUMULIN 70/30 PEN) (70-30) 100 UNIT/ML PEN Inject 15 units subcutaneously into the skin in the mornings with breakfast.  Inject 10 units subcutaneously in the evenings with dinner. (Patient not taking: Reported on 09/26/2015) 15 mL 11   No current facility-administered medications on file prior to visit.    ROS: all negative except above.   Physical Exam: Filed Weights   09/26/15 1018  Weight: 211 lb (95.709 kg)   BP 106/58 mmHg  Pulse 78  Temp(Src) 98.2 F (36.8 C) (Temporal)  Resp 18  Ht 5' 2.5" (1.588 m)  Wt 211 lb (95.709 kg)  BMI 37.95 kg/m2 General Appearance: Well developed well nourished, non-toxic appearing in no apparent distress. Eyes: PERRLA, EOMs, conjunctiva w/ no swelling or erythema or discharge Sinuses: No Frontal/maxillary  tenderness ENT/Mouth: Ear canals clear without swelling or erythema.  TM's normal bilaterally with no retractions, bulging, or loss of landmarks.   Neck: Supple, thyroid normal, no notable JVD  Respiratory: Respiratory effort normal, Clear breath sounds anteriorly and posteriorly bilaterally without rales, rhonchi, wheezing or stridor. No retractions or accessory muscle usage. Cardio: RRR with no MRGs.   Abdomen: Soft, + BS.  Non tender, no guarding, rebound, hernias, masses.  Musculoskeletal: Full ROM, 5/5 strength, normal gait.  Skin: Warm, dry without rashes  Neuro: Awake and oriented X 3, Cranial nerves intact. Normal muscle tone, no cerebellar symptoms. Sensation intact.  Psych: normal affect, Insight and Judgment appropriate.     [name], PA-C 10:31 AM [facility]

## 2015-09-27 LAB — HEMOGLOBIN A1C
Hgb A1c MFr Bld: 11.5 % — ABNORMAL HIGH (ref <5.7)
MEAN PLASMA GLUCOSE: 283 mg/dL

## 2015-09-29 ENCOUNTER — Other Ambulatory Visit: Payer: Self-pay | Admitting: Internal Medicine

## 2015-09-29 DIAGNOSIS — G4733 Obstructive sleep apnea (adult) (pediatric): Secondary | ICD-10-CM | POA: Diagnosis not present

## 2015-09-29 MED ORDER — INSULIN PEN NEEDLE 31G X 4 MM MISC: 1.0000 | Freq: Two times a day (BID) | Status: DC

## 2015-09-29 MED ORDER — DIFLUCAN 150 MG PO TABS: 150.0000 mg | ORAL_TABLET | Freq: Once | ORAL | Status: DC

## 2015-10-08 ENCOUNTER — Telehealth: Payer: Self-pay | Admitting: *Deleted

## 2015-10-08 NOTE — Telephone Encounter (Signed)
Patient called with concerns about elevated BS readings.  States readings have not been below 300 since starting insulin.  Patient also notes she is under a lot of stress but does not want to take a Rx to help with her stress/anxiety.  Per [name], PA-C orders, patient was advised to increase insulin to 20 units in the morning and 15 units in the evening.  Advised to keep a BS reading log and bring to upcoming appt with Dr. Melford Aase.  Advised patient to call if any drastic change in BS readings.  Patient expressed understanding

## 2015-10-21 ENCOUNTER — Encounter: Payer: Self-pay | Admitting: Internal Medicine

## 2015-10-21 ENCOUNTER — Ambulatory Visit (INDEPENDENT_AMBULATORY_CARE_PROVIDER_SITE_OTHER): Payer: PPO | Admitting: Internal Medicine

## 2015-10-21 VITALS — BP 110/62 | HR 76 | Temp 97.5°F | Resp 16 | Ht 62.5 in | Wt 208.0 lb

## 2015-10-21 DIAGNOSIS — G2581 Restless legs syndrome: Secondary | ICD-10-CM | POA: Diagnosis not present

## 2015-10-21 DIAGNOSIS — I10 Essential (primary) hypertension: Secondary | ICD-10-CM | POA: Diagnosis not present

## 2015-10-21 DIAGNOSIS — D519 Vitamin B12 deficiency anemia, unspecified: Secondary | ICD-10-CM

## 2015-10-21 DIAGNOSIS — Z79899 Other long term (current) drug therapy: Secondary | ICD-10-CM

## 2015-10-21 DIAGNOSIS — E782 Mixed hyperlipidemia: Secondary | ICD-10-CM

## 2015-10-21 DIAGNOSIS — N183 Chronic kidney disease, stage 3 (moderate): Secondary | ICD-10-CM

## 2015-10-21 DIAGNOSIS — E559 Vitamin D deficiency, unspecified: Secondary | ICD-10-CM

## 2015-10-21 DIAGNOSIS — E1122 Type 2 diabetes mellitus with diabetic chronic kidney disease: Secondary | ICD-10-CM | POA: Diagnosis not present

## 2015-10-21 MED ORDER — METFORMIN HCL ER 500 MG PO TB24: ORAL_TABLET | ORAL | Status: DC

## 2015-10-21 MED ORDER — ROPINIROLE HCL 3 MG PO TABS: ORAL_TABLET | ORAL | Status: DC

## 2015-10-21 MED ORDER — INSULIN ISOPHANE & REGULAR (HUMAN 70-30)100 UNIT/ML KWIKPEN: PEN_INJECTOR | SUBCUTANEOUS | Status: DC

## 2015-10-21 NOTE — Patient Instructions (Addendum)
Increase Humulin 70/30 Mix   to 35 units in am   & 25 units in pm  ++++++++++++++++++++++++++++++++++++++++++++++++++++++++ Recommend Adult Low Dose Aspirin or   coated  Aspirin 81 mg daily   To reduce risk of Colon Cancer 20 %,   Skin Cancer 26 % ,   Melanoma 46%   and   Pancreatic cancer 60%   ++++++++++++++++++++++++++++++++++++++++++++++++++++++ Vitamin D goal   is between 70-100.   Please make sure that you are taking your Vitamin D as directed.   It is very important as a natural anti-inflammatory   helping hair, skin, and nails, as well as reducing stroke and heart attack risk.   It helps your bones and helps with mood.  It also decreases numerous cancer risks so please take it as directed.   Low Vit D is associated with a 200-300% higher risk for CANCER   and 200-300% higher risk for HEART   ATTACK  &  STROKE.   .....................................Marland Kitchen  It is also associated with higher death rate at younger ages,   autoimmune diseases like Rheumatoid arthritis, Lupus, Multiple Sclerosis.     Also many other serious conditions, like depression, Alzheimer's  Dementia, infertility, muscle aches, fatigue, fibromyalgia - just to name a few.  ++++++++++++++++++++++++++++++++++++++++++++++++  Recommend the book "The END of DIETING" by Dr Excell Seltzer   & the book "The END of DIABETES " by Dr Excell Seltzer  At [email or web address] - get book & Audio CD's     Being diabetic has a  300% increased risk for heart attack, stroke, cancer, and alzheimer- type vascular dementia. It is very important that you work harder with diet by avoiding all foods that are white. Avoid white rice (brown & wild rice is OK), white potatoes (sweetpotatoes in moderation is OK), White bread or wheat bread or anything made out of white flour like bagels, donuts, rolls, buns, biscuits, cakes, pastries, cookies, pizza crust, and pasta (made from white flour & egg whites) - vegetarian pasta or  spinach or wheat pasta is OK. Multigrain breads like Arnold's or Pepperidge Farm, or multigrain sandwich thins or flatbreads.  Diet, exercise and weight loss can reverse and cure diabetes in the early stages.  Diet, exercise and weight loss is very important in the control and prevention of complications of diabetes which affects every system in your body, ie. Brain - dementia/stroke, eyes - glaucoma/blindness, heart - heart attack/heart failure, kidneys - dialysis, stomach - gastric paralysis, intestines - malabsorption, nerves - severe painful neuritis, circulation - gangrene & loss of a leg(s), and finally cancer and Alzheimers.    I recommend avoid fried & greasy foods,  sweets/candy, white rice (brown or wild rice or Quinoa is OK), white potatoes (sweet potatoes are OK) - anything made from white flour - bagels, doughnuts, rolls, buns, biscuits,white and wheat breads, pizza crust and traditional pasta made of white flour & egg white(vegetarian pasta or spinach or wheat pasta is OK).  Multi-grain bread is OK - like multi-grain flat bread or sandwich thins. Avoid alcohol in excess. Exercise is also important.    Eat all the vegetables you want - avoid meat, especially red meat and dairy - especially cheese.  Cheese is the most concentrated form of trans-fats which is the worst thing to clog up our arteries. Veggie cheese is OK which can be found in the fresh produce section at [employer] or [employer] or [employer]  ++++++++++++++++++++++++++++++++++++++++++++++++++ DASH Eating Plan  DASH stands for "Dietary Approaches to Stop  Hypertension."   The DASH eating plan is a healthy eating plan that has been shown to reduce high blood pressure (hypertension). Additional health benefits may include reducing the risk of type 2 diabetes mellitus, heart disease, and stroke. The DASH eating plan may also help with weight loss.  WHAT DO I NEED TO KNOW ABOUT THE DASH EATING PLAN?  For the DASH eating plan,  you will follow these general guidelines:  Choose foods with a percent daily value for sodium of less than 5% (as listed on the food label).  Use salt-free seasonings or herbs instead of table salt or sea salt.  Check with your health care provider or pharmacist before using salt substitutes.  Eat lower-sodium products, often labeled as "lower sodium" or "no salt added."  Eat fresh foods.  Eat more vegetables, fruits, and low-fat dairy products.    Choose whole grains. Look for the word "whole" as the first word in the ingredient list.  Choose fish   Limit sweets, desserts, sugars, and sugary drinks.  Choose heart-healthy fats.  Eat veggie cheese   Eat more home-cooked food and less restaurant, buffet, and fast food.  Limit fried foods.  Cook foods using methods other than frying.  Limit canned vegetables. If you do use them, rinse them well to decrease the sodium.  When eating at a restaurant, ask that your food be prepared with less salt, or no salt if possible.                      WHAT FOODS CAN I EAT?  Read Dr Fara Olden Fuhrman's books on The End of Dieting & The End of Diabetes  Grains  Whole grain or whole wheat bread. Brown rice. Whole grain or whole wheat pasta. Quinoa, bulgur, and whole grain cereals. Low-sodium cereals. Corn or whole wheat flour tortillas. Whole grain cornbread. Whole grain crackers. Low-sodium crackers.  Vegetables  Fresh or frozen vegetables (raw, steamed, roasted, or grilled). Low-sodium or reduced-sodium tomato and vegetable juices. Low-sodium or reduced-sodium tomato sauce and paste. Low-sodium or reduced-sodium canned vegetables.   Fruits  All fresh, canned (in natural juice), or frozen fruits.  Protein Products   All fish and seafood.  Dried beans, peas, or lentils. Unsalted nuts and seeds. Unsalted canned beans.  Dairy  Low-fat dairy products, such as skim or 1% milk, 2% or reduced-fat cheeses, low-fat ricotta or cottage cheese,  or plain low-fat yogurt. Low-sodium or reduced-sodium cheeses.  Fats and Oils  Tub margarines without trans fats. Light or reduced-fat mayonnaise and salad dressings (reduced sodium). Avocado. Safflower, olive, or canola oils. Natural peanut or almond butter.  Other  Unsalted popcorn and pretzels. The items listed above may not be a complete list of recommended foods or beverages. Contact your dietitian for more options.  +++++++++++++++++++++++++++++++++++++++++++  WHAT FOODS ARE NOT RECOMMENDED?  Grains/ White flour or wheat flour  White bread. White pasta. White rice. Refined cornbread. Bagels and croissants. Crackers that contain trans fat.  Vegetables  Creamed or fried vegetables. Vegetables in a . Regular canned vegetables. Regular canned tomato sauce and paste. Regular tomato and vegetable juices.  Fruits  Dried fruits. Canned fruit in light or heavy syrup. Fruit juice.  Meat and Other Protein Products  Meat in general - RED mwaet & White meat.  Fatty cuts of meat. Ribs, chicken wings, bacon, sausage, bologna, salami, chitterlings, fatback, hot dogs, bratwurst, and packaged luncheon meats.  Dairy  Whole or 2% milk, cream, half-and-half, and cream  cheese. Whole-fat or sweetened yogurt. Full-fat cheeses or blue cheese. Nondairy creamers and whipped toppings. Processed cheese, cheese spreads, or cheese curds.  Condiments  Onion and garlic salt, seasoned salt, table salt, and sea salt. Canned and packaged gravies. Worcestershire sauce. Tartar sauce. Barbecue sauce. Teriyaki sauce. Soy sauce, including reduced sodium. Steak sauce. Fish sauce. Oyster sauce. Cocktail sauce. Horseradish. Ketchup and mustard. Meat flavorings and tenderizers. Bouillon cubes. Hot sauce. Tabasco sauce. Marinades. Taco seasonings. Relishes.  Fats and Oils Butter, stick margarine, lard, shortening and bacon fat. Coconut, palm kernel, or palm oils. Regular salad dressings.  Pickles and olives.  Salted popcorn and pretzels.  The items listed above may not be a complete list of foods and beverages to avoid.

## 2015-10-22 LAB — VITAMIN D 25 HYDROXY (VIT D DEFICIENCY, FRACTURES): VIT D 25 HYDROXY: 33 ng/mL (ref 30–100)

## 2015-10-22 LAB — BASIC METABOLIC PANEL WITH GFR
BUN: 17 mg/dL (ref 7–25)
CO2: 26 mmol/L (ref 20–31)
Calcium: 9.3 mg/dL (ref 8.6–10.4)
Chloride: 95 mmol/L — ABNORMAL LOW (ref 98–110)
Creat: 1.25 mg/dL — ABNORMAL HIGH (ref 0.60–0.93)
GFR, EST AFRICAN AMERICAN: 49 mL/min — AB (ref >=60)
GFR, EST NON AFRICAN AMERICAN: 42 mL/min — AB (ref >=60)
GLUCOSE: 503 mg/dL — AB (ref 65–99)
POTASSIUM: 4.4 mmol/L (ref 3.5–5.3)
Sodium: 134 mmol/L — ABNORMAL LOW (ref 135–146)

## 2015-10-22 LAB — TSH: TSH: 0.7 m[IU]/L

## 2015-10-22 LAB — HEPATIC FUNCTION PANEL
ALBUMIN: 3.9 g/dL (ref 3.6–5.1)
ALK PHOS: 75 U/L (ref 33–130)
ALT: 13 U/L (ref 6–29)
AST: 17 U/L (ref 10–35)
BILIRUBIN INDIRECT: 0.3 mg/dL (ref 0.2–1.2)
Bilirubin, Direct: 0.1 mg/dL (ref <=0.2)
TOTAL PROTEIN: 6.6 g/dL (ref 6.1–8.1)
Total Bilirubin: 0.4 mg/dL (ref 0.2–1.2)

## 2015-10-22 LAB — CBC WITH DIFFERENTIAL/PLATELET
BASOS PCT: 1 %
Basophils Absolute: 44 cells/uL (ref 0–200)
EOS ABS: 176 {cells}/uL (ref 15–500)
Eosinophils Relative: 4 %
HEMATOCRIT: 43.7 % (ref 35.0–45.0)
HEMOGLOBIN: 13.8 g/dL (ref 11.7–15.5)
LYMPHS ABS: 1364 {cells}/uL (ref 850–3900)
LYMPHS PCT: 31 %
MCH: 28.6 pg (ref 27.0–33.0)
MCHC: 31.6 g/dL — AB (ref 32.0–36.0)
MCV: 90.5 fL (ref 80.0–100.0)
MONO ABS: 176 {cells}/uL — AB (ref 200–950)
MPV: 9.9 fL (ref 7.5–12.5)
Monocytes Relative: 4 %
NEUTROS PCT: 60 %
Neutro Abs: 2640 cells/uL (ref 1500–7800)
Platelets: 228 10*3/uL (ref 140–400)
RBC: 4.83 MIL/uL (ref 3.80–5.10)
RDW: 15 % (ref 11.0–15.0)
WBC: 4.4 10*3/uL (ref 3.8–10.8)

## 2015-10-22 LAB — LIPID PANEL
CHOL/HDL RATIO: 9.5 ratio — AB (ref <=5.0)
Cholesterol: 360 mg/dL — ABNORMAL HIGH (ref 125–200)
HDL: 38 mg/dL — AB (ref >=46)
Triglycerides: 658 mg/dL — ABNORMAL HIGH (ref <150)

## 2015-10-22 LAB — MAGNESIUM: MAGNESIUM: 1.9 mg/dL (ref 1.5–2.5)

## 2015-10-22 LAB — INSULIN, RANDOM: INSULIN: 34.3 u[IU]/mL — AB (ref 2.0–19.6)

## 2015-10-22 LAB — HEMOGLOBIN A1C
HEMOGLOBIN A1C: 13.2 % — AB (ref <5.7)
Mean Plasma Glucose: 332 mg/dL

## 2015-10-22 LAB — VITAMIN B12: Vitamin B-12: 414 pg/mL (ref 200–1100)

## 2015-10-22 NOTE — Progress Notes (Signed)
Patient ID: Janet Choi, female   DOB: 1941-03-25, 75 y.o.   MRN: [id number]  [facility] ADULT & ADOLESCENT INTERNAL MEDICINE                       Janet Choi, M.D.        Janet Choi. Janet Choi, P.A.-C       Janet Choi, P.A.-C   [facility]                [address] [place], [place] [id number] Telephone [phone] Telefax [phone] _________________________________________________________________________     This very nice 75 y.o. DWF presents for Diabetic follow up with Hypertension, Hyperlipidemia, Pre-Diabetes and Vitamin D Deficiency.      Patient is treated for HTN & BP has been controlled at home. Today's BP: 110/62 mmHg. Patient has had no complaints of any cardiac type chest pain, palpitations, dyspnea/orthopnea/PND, dizziness, claudication, or dependent edema.     Hyperlipidemia is not controlled with diet & meds. Patient denies myalgias or other med SE's. Last Lipids were not at goal with Elevated Cholesterol 308*; HDL 35; & Elevated Triglycerides 675* on 09/26/2015.     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 Currently she is on Humulin 70/30 mix and is taking 25 u qam & 15 u qpm.  Patient reports CBG's are ranging between 300-375 mg%.   Last A1c was 11.5% on 09/26/2015.     Further, the patient also has history of Vitamin D Deficiency and supplements vitamin D without any suspected side-effects. Last vitamin D was 43 on 04/14/2015.  Medication Sig  . VITAMIN D 2000 UNITS  Take 10,000 Units by mouth daily.   Marland Kitchen FLUoxetine  40 MG capsule TAKE 1 CAPSULE BY MOUTH DAILY.  . furosemide 40 MG tablet TAKE 1 TABLET 3 TIMES A DAY BEFORE MEALS FOR EDEMA ( STOP TAKING HCTZ)  . gabapentin  600 MG tablet Take 1/2 to 1 tablet 3 x /daily for Pain.  Marland Kitchen glucose blood test strip Use as  instructed  . losartan50 MG tablet TAKE 1 TABLET BY MOUTH DAILY FOR BLOOD PRESSURE AND KIDNEY  . Magnesium 400 MG Take 1 capsule by mouth 2 (two) times daily.   . Ranitidine 75 mg 150 mg at bedtime.  . Slow Released Iron daily.   Marland Kitchen VITAMIN B-12 1000 MCG/ML inj monthly  .  HUMULIN 70/30 PEN Inject 15 units subcut with breakfast.  Inject 10 units subcut with dinner.  . metFORMIN1000 MG tablet Take 1,000 mg by mouth 2 (two) times daily with a meal.  . rOPINIRole  3 MG tablet Take 1 tablet (3 mg total) by mouth at bedtime.  Janet Choi 625 MG tablet Take 625 mg by mouth 2 (two) times daily with a meal.  . DIFLUCAN 150 MG tablet Take 1 tablet (150 mg total) by mouth once.   Allergies  Allergen Reactions  . Aspirin     Large doses per patient  . Crestor [Rosuvastatin]     Leg crams  . Lipitor [Atorvastatin]     Leg crams  . Lyrica [Pregabalin] Other (See Comments)    Blurred vision  . Minocycline Hcl     REACTION: Reaction not known  . Oruvail [Ketoprofen]   .  Tricor [Fenofibrate]   . Vasotec [Enalaprilat]   . Victoza [Liraglutide]   . Zinc     PMHx:   Past Medical History  Diagnosis Date  . Diabetes mellitus without complication ([place])   . Hypertension   . Hyperlipidemia   . OSA (obstructive sleep apnea)   . DJD (degenerative joint disease)   . RLS (restless legs syndrome)   . Obesity   . Vitamin D deficiency   . Anxiety   . IBS (irritable bowel syndrome)    Immunization History  Administered Date(s) Administered  . DT 04/14/2015  . Hepatitis B 07/09/1999, 08/09/1999, 01/09/2000  . Influenza, High Dose Seasonal PF 03/20/2014  . Influenza-Unspecified 03/28/2015, 04/14/2015  . Pneumococcal Conjugate-13 03/20/2014  . Pneumococcal Polysaccharide-23 10/15/2009  . Td 03/19/2004   Past Surgical History  Procedure Laterality Date  . Total knee arthroplasty Bilateral   . Cataract extraction Bilateral   . Rotator cuff repair Bilateral   . Lumbar disc surgery      x 2  .  Appendectomy     FHx:    Reviewed / unchanged  SHx:    Reviewed / unchanged  Systems Review:  Constitutional: Denies fever, chills, wt changes, headaches, insomnia, fatigue, night sweats, change in appetite. Eyes: Denies redness, blurred vision, diplopia, discharge, itchy, watery eyes.  ENT: Denies discharge, congestion, post nasal drip, epistaxis, sore throat, earache, hearing loss, dental pain, tinnitus, vertigo, sinus pain, snoring.  CV: Denies chest pain, palpitations, irregular heartbeat, syncope, dyspnea, diaphoresis, orthopnea, PND, claudication or edema. Respiratory: denies cough, dyspnea, DOE, pleurisy, hoarseness, laryngitis, wheezing.  Gastrointestinal: Denies dysphagia, odynophagia, heartburn, reflux, water brash, abdominal pain or cramps, nausea, vomiting, bloating, diarrhea, constipation, hematemesis, melena, hematochezia  or hemorrhoids. Genitourinary: Denies dysuria, frequency, urgency, nocturia, hesitancy, discharge, hematuria or flank pain. Musculoskeletal: Denies arthralgias, myalgias, stiffness, jt. swelling, pain, limping or strain/sprain.  Skin: Denies pruritus, rash, hives, warts, acne, eczema or change in skin lesion(s). Neuro: No weakness, tremor, incoordination, spasms, paresthesia or pain. Psychiatric: Denies confusion, memory loss or sensory loss. Endo: Denies change in weight, skin or hair change.  Heme/Lymph: No excessive bleeding, bruising or enlarged lymph nodes.  Physical Exam  BP 110/62 mmHg  Pulse 76  Temp(Src) 97.5 F (36.4 C)  Resp 16  Ht 5' 2.5" (1.588 m)  Wt 208 lb (94.348 kg)  BMI 37.41 kg/m2  Appears Over nourished and in no distress. Eyes: PERRLA, EOMs, conjunctiva no swelling or erythema. Sinuses: No frontal/maxillary tenderness ENT/Mouth: EAC's clear, TM's nl w/o erythema, bulging. Nares clear w/o erythema, swelling, exudates. Oropharynx clear without erythema or exudates. Oral hygiene is good. Tongue normal, non obstructing. Hearing  intact.  Neck: Supple. Thyroid nl. Car 2+/2+ without bruits, nodes or JVD. Chest: Respirations nl with BS clear & equal w/o rales, rhonchi, wheezing or stridor.  Cor: Heart sounds normal w/ regular rate and rhythm without sig. murmurs, gallops, clicks, or rubs. Peripheral pulses normal and equal  without edema.  Abdomen: Soft & bowel sounds normal. Non-tender w/o guarding, rebound, hernias, masses, or organomegaly.  Lymphatics: Unremarkable.  Musculoskeletal: Full ROM all peripheral extremities, joint stability, 5/5 strength, and normal gait.  Skin: Warm, dry without exposed rashes, lesions or ecchymosis apparent.  Neuro: Cranial nerves intact, reflexes equal bilaterally. Sensory-motor testing grossly intact. Tendon reflexes grossly intact.  Pysch: Alert & oriented x 3.  Insight and judgement nl & appropriate. No ideations.  Assessment and Plan:  1. Essential hypertension  - TSH  2. Hyperlipidemia  - Lipid panel - TSH  3. 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 Vitamin D deficiency  - VITAMIN D 25 Hydroxy  5. Vitamin B12 deficiency anemia  - Vitamin B12 - Methylmalonic acid, serum  6. RLS (restless legs syndrome)  - rOPINIRole (REQUIP) 3 MG tablet; Take 1 tablet 4 x / day for Restless Legs  Dispense: 360 tablet; Refill: 1  7. Medication management  - CBC with Differential/Platelet - BASIC METABOLIC PANEL WITH GFR - Hepatic function panel - Magnesium   Recommended regular exercise, BP monitoring, weight control, and  discussed med and SE's. Recommended labs to assess and monitor clinical status. Further disposition pending results of labs. Over 30 minutes of exam, counseling, chart review was performed

## 2015-10-24 ENCOUNTER — Encounter: Payer: Self-pay | Admitting: Internal Medicine

## 2015-10-25 LAB — METHYLMALONIC ACID, SERUM: Methylmalonic Acid, Quant: 556 nmol/L — ABNORMAL HIGH (ref 87–318)

## 2015-10-30 DIAGNOSIS — G4733 Obstructive sleep apnea (adult) (pediatric): Secondary | ICD-10-CM | POA: Diagnosis not present

## 2015-11-14 ENCOUNTER — Encounter: Payer: Self-pay | Admitting: Internal Medicine

## 2015-11-14 ENCOUNTER — Ambulatory Visit (INDEPENDENT_AMBULATORY_CARE_PROVIDER_SITE_OTHER): Payer: PPO | Admitting: Internal Medicine

## 2015-11-14 VITALS — BP 130/62 | HR 80 | Temp 98.2°F | Resp 18 | Ht 62.5 in

## 2015-11-14 DIAGNOSIS — N183 Chronic kidney disease, stage 3 unspecified: Secondary | ICD-10-CM

## 2015-11-14 DIAGNOSIS — G4733 Obstructive sleep apnea (adult) (pediatric): Secondary | ICD-10-CM | POA: Diagnosis not present

## 2015-11-14 DIAGNOSIS — Z91199 Patient's noncompliance with other medical treatment and regimen due to unspecified reason: Secondary | ICD-10-CM

## 2015-11-14 DIAGNOSIS — B372 Candidiasis of skin and nail: Secondary | ICD-10-CM

## 2015-11-14 DIAGNOSIS — B373 Candidiasis of vulva and vagina: Secondary | ICD-10-CM | POA: Diagnosis not present

## 2015-11-14 DIAGNOSIS — Z9989 Dependence on other enabling machines and devices: Principal | ICD-10-CM

## 2015-11-14 DIAGNOSIS — Z9119 Patient's noncompliance with other medical treatment and regimen: Secondary | ICD-10-CM | POA: Diagnosis not present

## 2015-11-14 DIAGNOSIS — E1122 Type 2 diabetes mellitus with diabetic chronic kidney disease: Secondary | ICD-10-CM | POA: Diagnosis not present

## 2015-11-14 DIAGNOSIS — B3731 Acute candidiasis of vulva and vagina: Secondary | ICD-10-CM

## 2015-11-14 MED ORDER — RIZATRIPTAN BENZOATE 10 MG PO TABS: 10.0000 mg | ORAL_TABLET | Freq: Once | ORAL | Status: DC | PRN

## 2015-11-14 MED ORDER — FLUCONAZOLE 150 MG PO TABS: 150.0000 mg | ORAL_TABLET | Freq: Once | ORAL | Status: DC

## 2015-11-14 MED ORDER — CLOTRIMAZOLE-BETAMETHASONE 1-0.05 % EX CREA: TOPICAL_CREAM | CUTANEOUS | Status: DC

## 2015-11-14 NOTE — Patient Instructions (Signed)
Please take diflucan daily for 1 week.  Please use the lotrisone cream on the rash that is present.  Please keep your skin clean and dry.    Please increase your insulin to 50 units in the morning and 45 units in the evening.   Please continue to take flonase daily.  2 sprays per nostril.  Please take tylenol at the first sign of a headache.  If this doesn't work then go ahead and take ONEOK tablet.

## 2015-11-14 NOTE — Progress Notes (Signed)
Subjective:    Patient ID: Janet Choi, female    DOB: 14-May-1940, 75 y.o.   MRN: [id number]  HPI  Patient presents to the office for evaluation of headaches and yeast infections.  She reports that she spoke to the pharmacist and he suggested that she started using some flonase which has helped.  She reports that it is made worse with heat.  She reports that she is not sleeping well.  She reports that she is staying awake and is eating a lot of carbohydrates.  She is doing 35 units in the morning and 25 units in the evenings.  She reports that she continues to get yeast on her skin and also vaginal yeast infections.  She is using desitin and vagasil to help with the itching.  She has had it several times now.  She has had some relief with using the cream, but she does not have any right now.  She is cleaning with disposable wipes.  She reports that she is not sitting in her urine.  She reports that she takes the pills and it goes away but then it will come.    Blood sugars have been improving recently.  She is having an average of 250s.  She was told to increase her insulin to 50 units twice daily which she never did.     Review of Systems  Constitutional: Negative for fever, chills and fatigue.  HENT: Negative.   Respiratory: Negative.   Cardiovascular: Negative.   Gastrointestinal: Negative for nausea, vomiting, abdominal pain, diarrhea and constipation.  Genitourinary: Positive for vaginal discharge. Negative for urgency, frequency, hematuria, vaginal bleeding, difficulty urinating and vaginal pain.  Skin: Positive for rash. Negative for color change, pallor and wound.       Objective:   Physical Exam  Constitutional: She is oriented to person, place, and time. She appears well-developed and well-nourished. No distress.  HENT:  Head: Normocephalic.  Mouth/Throat: Oropharynx is clear and moist. No oropharyngeal exudate.  Eyes: Conjunctivae are normal. No scleral icterus.  Neck:  Normal range of motion. Neck supple. No JVD present. No thyromegaly present.  Cardiovascular: Normal rate, regular rhythm, normal heart sounds and intact distal pulses.  Exam reveals no gallop and no friction rub.   No murmur heard. Pulmonary/Chest: Effort normal and breath sounds normal. No respiratory distress. She has no wheezes. She has no rales. She exhibits no tenderness.  Abdominal: Soft. Bowel sounds are normal. She exhibits no distension and no mass. There is no tenderness. There is no rebound and no guarding.  Musculoskeletal: Normal range of motion.  Lymphadenopathy:    She has no cervical adenopathy.  Neurological: She is alert and oriented to person, place, and time.  Skin: Skin is warm and dry. Rash noted. She is not diaphoretic.     Psychiatric: She has a normal mood and affect. Her behavior is normal. Judgment and thought content normal.  Nursing note and vitals reviewed.   Filed Vitals:   11/14/15 1019  BP: 130/62  Pulse: 80  Temp: 98.2 F (36.8 C)  Resp: 18         Assessment & Plan:    1. OSA on CPAP -recommended increased compliance -likely source of daytime headaches -recommened maxalt for headaches prn  2. Controlled type 2 diabetes mellitus with stage 3 chronic kidney disease, without long-term current use of insulin (HCC) -increase insulin to 50 units in the morning -increase to 45 units in the evening  3. Candidal dermatitis -  lotrisone cream  4. Vulvovaginal candidiasis -diflucan  5. Compliance poor -recommended increased compliance.

## 2015-11-25 ENCOUNTER — Ambulatory Visit: Payer: Self-pay | Admitting: Internal Medicine

## 2015-11-27 ENCOUNTER — Other Ambulatory Visit: Payer: Self-pay | Admitting: Internal Medicine

## 2015-11-29 DIAGNOSIS — G4733 Obstructive sleep apnea (adult) (pediatric): Secondary | ICD-10-CM | POA: Diagnosis not present

## 2015-12-04 ENCOUNTER — Telehealth: Payer: Self-pay | Admitting: Internal Medicine

## 2015-12-04 DIAGNOSIS — IMO0001 Reserved for inherently not codable concepts without codable children: Secondary | ICD-10-CM

## 2015-12-04 DIAGNOSIS — Z91199 Patient's noncompliance with other medical treatment and regimen due to unspecified reason: Secondary | ICD-10-CM

## 2015-12-04 DIAGNOSIS — Z9119 Patient's noncompliance with other medical treatment and regimen: Secondary | ICD-10-CM

## 2015-12-04 DIAGNOSIS — E119 Type 2 diabetes mellitus without complications: Principal | ICD-10-CM

## 2015-12-04 DIAGNOSIS — Z794 Long term (current) use of insulin: Principal | ICD-10-CM

## 2015-12-04 NOTE — Telephone Encounter (Signed)
Patient called reporting that she is taking her insulin 50 units twice daily of 70/30 mixed insulin and continues to have headaches, blurred vision, and dizziness.  Blood sugars are running around 140s.  We will refer to endocrine.  Have instructed patient to continue with insulin until seen by them.

## 2015-12-05 ENCOUNTER — Other Ambulatory Visit: Payer: Self-pay | Admitting: Internal Medicine

## 2015-12-19 ENCOUNTER — Ambulatory Visit (INDEPENDENT_AMBULATORY_CARE_PROVIDER_SITE_OTHER): Payer: PPO | Admitting: Internal Medicine

## 2015-12-19 ENCOUNTER — Encounter: Payer: Self-pay | Admitting: Internal Medicine

## 2015-12-19 VITALS — BP 118/66 | HR 80 | Temp 98.6°F | Resp 16 | Ht 62.5 in | Wt 224.0 lb

## 2015-12-19 DIAGNOSIS — Z794 Long term (current) use of insulin: Secondary | ICD-10-CM

## 2015-12-19 DIAGNOSIS — I10 Essential (primary) hypertension: Secondary | ICD-10-CM | POA: Diagnosis not present

## 2015-12-19 DIAGNOSIS — S161XXA Strain of muscle, fascia and tendon at neck level, initial encounter: Secondary | ICD-10-CM | POA: Diagnosis not present

## 2015-12-19 DIAGNOSIS — Z79899 Other long term (current) drug therapy: Secondary | ICD-10-CM | POA: Diagnosis not present

## 2015-12-19 DIAGNOSIS — E119 Type 2 diabetes mellitus without complications: Secondary | ICD-10-CM | POA: Diagnosis not present

## 2015-12-19 LAB — BASIC METABOLIC PANEL WITH GFR
BUN: 27 mg/dL — ABNORMAL HIGH (ref 7–25)
CHLORIDE: 100 mmol/L (ref 98–110)
CO2: 29 mmol/L (ref 20–31)
Calcium: 9.5 mg/dL (ref 8.6–10.4)
Creat: 1.21 mg/dL — ABNORMAL HIGH (ref 0.60–0.93)
GFR, EST NON AFRICAN AMERICAN: 44 mL/min — AB (ref >=60)
GFR, Est African American: 51 mL/min — ABNORMAL LOW (ref >=60)
GLUCOSE: 320 mg/dL — AB (ref 65–99)
POTASSIUM: 4.8 mmol/L (ref 3.5–5.3)
SODIUM: 139 mmol/L (ref 135–146)

## 2015-12-19 LAB — HEMOGLOBIN A1C
Hgb A1c MFr Bld: 11.1 % — ABNORMAL HIGH (ref <5.7)
MEAN PLASMA GLUCOSE: 272 mg/dL

## 2015-12-19 MED ORDER — TIZANIDINE HCL 4 MG PO TABS: ORAL_TABLET | ORAL | 1 refills | Status: DC

## 2015-12-19 NOTE — Patient Instructions (Signed)
May try splitting your Insulin 50 units 2 x/da to   30 units 3x/day with meals  ======================================================== Cervical Sprain A cervical sprain is an injury in the neck in which the strong, fibrous tissues (ligaments) that connect your neck bones stretch or tear. Cervical sprains can range from mild to severe. Severe cervical sprains can cause the neck vertebrae to be unstable. This can lead to damage of the spinal cord and can result in serious nervous system problems. The amount of time it takes for a cervical sprain to get better depends on the cause and extent of the injury. Most cervical sprains heal in 1 to 3 weeks. CAUSES  Severe cervical sprains may be caused by:   Contact sport injuries (such as from football, rugby, wrestling, hockey, auto racing, gymnastics, diving, martial arts, or boxing).   Motor vehicle collisions.   Whiplash injuries. This is an injury from a sudden forward and backward whipping movement of the head and neck.  Falls.  Mild cervical sprains may be caused by:   Being in an awkward position, such as while cradling a telephone between your ear and shoulder.   Sitting in a chair that does not offer proper support.   Working at a poorly Landscape [employer] station.   Looking up or down for long periods of time.  SYMPTOMS   Pain, soreness, stiffness, or a burning sensation in the front, back, or sides of the neck. This discomfort may develop immediately after the injury or slowly, 24 hours or more after the injury.   Pain or tenderness directly in the middle of the back of the neck.   Shoulder or upper back pain.   Limited ability to move the neck.   Headache.   Dizziness.   Weakness, numbness, or tingling in the hands or arms.   Muscle spasms.   Difficulty swallowing or chewing.   Tenderness and swelling of the neck.  DIAGNOSIS  Most of the time your health care provider can diagnose a cervical  sprain by taking your history and doing a physical exam. Your health care provider will ask about previous neck injuries and any known neck problems, such as arthritis in the neck. X-rays may be taken to find out if there are any other problems, such as with the bones of the neck. Other tests, such as a CT scan or MRI, may also be needed.  TREATMENT  Treatment depends on the severity of the cervical sprain. Mild sprains can be treated with rest, keeping the neck in place (immobilization), and pain medicines. Severe cervical sprains are immediately immobilized. Further treatment is done to help with pain, muscle spasms, and other symptoms and may include:  Medicines, such as pain relievers, numbing medicines, or muscle relaxants.   Physical therapy. This may involve stretching exercises, strengthening exercises, and posture training. Exercises and improved posture can help stabilize the neck, strengthen muscles, and help stop symptoms from returning.  HOME CARE INSTRUCTIONS   Put ice on the injured area.   Put ice in a plastic bag.   Place a towel between your skin and the bag.   Leave the ice on for 15-20 minutes, 3-4 times a day.   If your injury was severe, you may have been given a cervical collar to wear. A cervical collar is a two-piece collar designed to keep your neck from moving while it heals.  Do not remove the collar unless instructed by your health care provider.  If you have long hair, keep  it outside of the collar.  Ask your health care provider before making any adjustments to your collar. Minor adjustments may be required over time to improve comfort and reduce pressure on your chin or on the back of your head.  Ifyou are allowed to remove the collar for cleaning or bathing, follow your health care provider's instructions on how to do so safely.  Keep your collar clean by wiping it with mild soap and water and drying it completely. If the collar you have been given  includes removable pads, remove them every 1-2 days and hand wash them with soap and water. Allow them to air dry. They should be completely dry before you wear them in the collar.  If you are allowed to remove the collar for cleaning and bathing, wash and dry the skin of your neck. Check your skin for irritation or sores. If you see any, tell your health care provider.  Do not drive while wearing the collar.   Only take over-the-counter or prescription medicines for pain, discomfort, or fever as directed by your health care provider.   Keep all follow-up appointments as directed by your health care provider.   Keep all physical therapy appointments as directed by your health care provider.   Make any needed adjustments to your workstation to promote good posture.   Avoid positions and activities that make your symptoms worse.   Warm up and stretch before being active to help prevent problems.  SEEK MEDICAL CARE IF:   Your pain is not controlled with medicine.   You are unable to decrease your pain medicine over time as planned.   Your activity level is not improving as expected.  SEEK IMMEDIATE MEDICAL CARE IF:   You develop any bleeding.  You develop stomach upset.  You have signs of an allergic reaction to your medicine.   Your symptoms get worse.   You develop new, unexplained symptoms.   You have numbness, tingling, weakness, or paralysis in any part of your body.  MAKE SURE YOU:   Understand these instructions.  Will watch your condition.  Will get help right away if you are not doing well or get worse.   This information is not intended to replace advice given to you by your health care provider. Make sure you discuss any questions you have with your health care provider.   Document Released: 02/21/2007 Document Revised: 05/01/2013 Document Reviewed: 11/01/2012 Elsevier Interactive Patient Education [employer].

## 2015-12-21 NOTE — Progress Notes (Signed)
Subjective:    Patient ID: Janet Choi, female    DOB: 07/23/1940, 75 y.o.   MRN: [id number]  HPI  Patient is a very nice , but very poorly compliant Insulin req T2_DM who admits her poor compliance stating "I just stay [place] all the time". Current on Hum 70/30 taking 50 units BID and reports am glucose ~140's and pm glucoses 200+'s. Denies hypoglycemic rxn's or sx's. Also is c/o a "sore neck". Denies injury.  Medication Sig  . aspirin EC 81 MG tablet Take 81 mg by mouth daily.  . BD PEN NEEDLE NANO U/F 32G X 4 MM MISC 2 (two) times daily. as directed  . Cholecalciferol (VITAMIN D) 2000 UNITS tablet Take 10,000 Units by mouth daily.   Marland Kitchen FLECTOR 1.3 % PTCH APPLY 1 PATCH EVERY 12 HOURS AS NEEDED THEN TAKE OFF FOR 12 HOURS  . fluconazole (DIFLUCAN) 150 MG tablet Take 1 tablet (150 mg total) by mouth once.  Marland Kitchen FLUoxetine (PROZAC) 40 MG capsule TAKE 1 CAPSULE BY MOUTH DAILY.  . furosemide (LASIX) 40 MG tablet TAKE 1 TABLET 3 TIMES A DAY BEFORE MEALS FOR EDEMA ( STOP TAKING HCTZ)  . gabapentin (NEURONTIN) 600 MG tablet Take 1/2 to 1 tablet 3 x /daily for Pain.  Marland Kitchen glucose blood (FREESTYLE LITE) test strip Check blood sugar 3 x / day as instructed  . losartan (COZAAR) 50 MG tablet TAKE 1 TABLET BY MOUTH DAILY FOR BLOOD PRESSURE AND KIDNEY  . Magnesium 400 MG CAPS Take 1 capsule by mouth 2 (two) times daily.   Marland Kitchen NOVOLIN 70/30 (70-30) 100 UNIT/ML injection USE 50 UNITS TWICE DAILY  . OVER THE COUNTER MEDICATION 150 mg at bedtime. [facility] acid reduced 75 mg at bedtime  . OVER THE COUNTER MEDICATION daily. Slow Released Iron  . rizatriptan (MAXALT) 10 MG tablet Take 1 tablet (10 mg total) by mouth once as needed for migraine. May repeat in 2 hours if needed  . rOPINIRole (REQUIP) 3 MG tablet Take 1 tablet 4 x / day for Restless Legs  . clonazePAM (KLONOPIN) 1 MG tablet Take 1 mg by mouth 3 (three) times daily.  . clotrimazole-betamethasone (LOTRISONE) cream Apply to affected area 2 times daily (Patient  not taking: Reported on 12/19/2015)  . HYDROcodone-acetaminophen (NORCO/VICODIN) 5-325 MG tablet Take 1 tablet by mouth every 6 (six) hours as needed for moderate pain.  . Insulin Isophane & Regular Human (HUMULIN 70/30 PEN) (70-30) 100 UNIT/ML PEN Inject 50 units 2 x / day with Bkfst & Supper as directed (Patient not taking: Reported on 12/19/2015)   No facility-administered medications prior to visit.    Allergies  Allergen Reactions  . Aspirin     Large doses per patient  . Crestor [Rosuvastatin]     Leg crams  . Lipitor [Atorvastatin]     Leg crams  . Lyrica [Pregabalin] Other (See Comments)    Blurred vision  . Minocycline Hcl     REACTION: Reaction not known  . Oruvail [Ketoprofen]   . Tricor [Fenofibrate]   . Vasotec [Enalaprilat]   . Victoza [Liraglutide]   . Zinc    Past Medical History:  Diagnosis Date  . Anxiety   . Diabetes mellitus without complication ([place])   . DJD (degenerative joint disease)   . Hyperlipidemia   . Hypertension   . IBS (irritable bowel syndrome)   . Obesity   . OSA (obstructive sleep apnea)   . RLS (restless legs syndrome)   . Vitamin D deficiency  Past Surgical History:  Procedure Laterality Date  . APPENDECTOMY    . CATARACT EXTRACTION Bilateral   . LUMBAR DISC SURGERY     x 2  . ROTATOR CUFF REPAIR Bilateral   . TOTAL KNEE ARTHROPLASTY Bilateral    Review of Systems  10 point systems review negative except as above.    Objective:   Physical Exam  BP 118/66   Pulse 80   Temp 98.6 F (37 C) (Temporal)   Resp 16   Ht 5' 2.5" (1.588 m)   Wt 224 lb (101.6 kg)   BMI 40.32 kg/m   Morbidly Obese older WF in no distress. Exposed skin is clear.   HEENT - Eac's patent. TM's Nl. EOM's full. PERRLA. NasoOroPharynx clear. Neck - Sl tender para-Cx spasm with sl decrease in lat rotation & bending.  Nl Thyroid. Carotids 2+ & No bruits, nodes, JVD Chest - Clear equal BS w/o Rales, rhonchi, wheezes. Cor - Nl HS. RRR w/o sig MGR. PP  1(+). No edema. MS- FROM w/o deformities. Muscle power, tone and bulk Nl. Gait Nl. Neuro - No obvious Cr N abnormalities. Sensory, motor and Cerebellar functions appear Nl w/o focal abnormalities.    Assessment & Plan:   1. Essential hypertension   2. Insulin-requiring or dependent type II diabetes mellitus ([place])  - Hemoglobin A1c - long discussion [id number] Diet and importance of better compliance.  Advised to change insulin dosing to 30 units tid ac.   3. Cervical strain, initial encounter  - tiZANidine (ZANAFLEX) 4 MG tablet; Take 1/2 to 1 tablet 2 to 3 x/day as needed for muscle spasm  Dispense: 90 tablet; Refill: 1  4. Medication management  - BASIC METABOLIC PANEL WITH GFR

## 2015-12-30 DIAGNOSIS — G4733 Obstructive sleep apnea (adult) (pediatric): Secondary | ICD-10-CM | POA: Diagnosis not present

## 2016-01-03 ENCOUNTER — Other Ambulatory Visit: Payer: Self-pay | Admitting: Internal Medicine

## 2016-01-06 DIAGNOSIS — H52203 Unspecified astigmatism, bilateral: Secondary | ICD-10-CM | POA: Diagnosis not present

## 2016-01-06 DIAGNOSIS — Z961 Presence of intraocular lens: Secondary | ICD-10-CM | POA: Diagnosis not present

## 2016-01-06 DIAGNOSIS — H40053 Ocular hypertension, bilateral: Secondary | ICD-10-CM | POA: Diagnosis not present

## 2016-01-06 DIAGNOSIS — E119 Type 2 diabetes mellitus without complications: Secondary | ICD-10-CM | POA: Diagnosis not present

## 2016-01-07 ENCOUNTER — Other Ambulatory Visit: Payer: Self-pay | Admitting: Internal Medicine

## 2016-01-14 ENCOUNTER — Ambulatory Visit: Payer: PPO | Admitting: Internal Medicine

## 2016-01-30 DIAGNOSIS — G4733 Obstructive sleep apnea (adult) (pediatric): Secondary | ICD-10-CM | POA: Diagnosis not present

## 2016-02-02 ENCOUNTER — Other Ambulatory Visit: Payer: Self-pay | Admitting: Internal Medicine

## 2016-02-04 ENCOUNTER — Other Ambulatory Visit: Payer: Self-pay | Admitting: Internal Medicine

## 2016-02-04 ENCOUNTER — Ambulatory Visit: Payer: Self-pay | Admitting: Internal Medicine

## 2016-02-11 ENCOUNTER — Encounter: Payer: Self-pay | Admitting: Internal Medicine

## 2016-02-11 ENCOUNTER — Ambulatory Visit (INDEPENDENT_AMBULATORY_CARE_PROVIDER_SITE_OTHER): Payer: PPO | Admitting: Internal Medicine

## 2016-02-11 VITALS — BP 124/70 | HR 70 | Temp 98.2°F | Resp 16 | Ht 62.5 in

## 2016-02-11 DIAGNOSIS — IMO0001 Reserved for inherently not codable concepts without codable children: Secondary | ICD-10-CM

## 2016-02-11 DIAGNOSIS — Z794 Long term (current) use of insulin: Secondary | ICD-10-CM

## 2016-02-11 DIAGNOSIS — G4733 Obstructive sleep apnea (adult) (pediatric): Secondary | ICD-10-CM | POA: Diagnosis not present

## 2016-02-11 DIAGNOSIS — R0602 Shortness of breath: Secondary | ICD-10-CM

## 2016-02-11 DIAGNOSIS — Z79899 Other long term (current) drug therapy: Secondary | ICD-10-CM | POA: Diagnosis not present

## 2016-02-11 DIAGNOSIS — E119 Type 2 diabetes mellitus without complications: Secondary | ICD-10-CM

## 2016-02-11 DIAGNOSIS — Z9989 Dependence on other enabling machines and devices: Secondary | ICD-10-CM

## 2016-02-11 DIAGNOSIS — R609 Edema, unspecified: Secondary | ICD-10-CM | POA: Diagnosis not present

## 2016-02-11 DIAGNOSIS — Z9119 Patient's noncompliance with other medical treatment and regimen: Secondary | ICD-10-CM | POA: Diagnosis not present

## 2016-02-11 DIAGNOSIS — Z91199 Patient's noncompliance with other medical treatment and regimen due to unspecified reason: Secondary | ICD-10-CM

## 2016-02-11 LAB — CBC WITH DIFFERENTIAL/PLATELET
Basophils Absolute: 0 {cells}/uL (ref 0–200)
Basophils Relative: 0 %
Eosinophils Absolute: 448 {cells}/uL (ref 15–500)
Eosinophils Relative: 8 %
HCT: 41.8 % (ref 35.0–45.0)
Hemoglobin: 13.7 g/dL (ref 11.7–15.5)
Lymphocytes Relative: 27 %
Lymphs Abs: 1512 {cells}/uL (ref 850–3900)
MCH: 29.3 pg (ref 27.0–33.0)
MCHC: 32.8 g/dL (ref 32.0–36.0)
MCV: 89.5 fL (ref 80.0–100.0)
MPV: 10.3 fL (ref 7.5–12.5)
Monocytes Absolute: 336 {cells}/uL (ref 200–950)
Monocytes Relative: 6 %
Neutro Abs: 3304 {cells}/uL (ref 1500–7800)
Neutrophils Relative %: 59 %
Platelets: 211 K/uL (ref 140–400)
RBC: 4.67 MIL/uL (ref 3.80–5.10)
RDW: 13.5 % (ref 11.0–15.0)
WBC: 5.6 K/uL (ref 3.8–10.8)

## 2016-02-11 LAB — HEPATIC FUNCTION PANEL
ALBUMIN: 3.9 g/dL (ref 3.6–5.1)
ALK PHOS: 105 U/L (ref 33–130)
ALT: 9 U/L (ref 6–29)
AST: 10 U/L (ref 10–35)
BILIRUBIN INDIRECT: 0.4 mg/dL (ref 0.2–1.2)
Bilirubin, Direct: 0.1 mg/dL (ref <=0.2)
TOTAL PROTEIN: 6.5 g/dL (ref 6.1–8.1)
Total Bilirubin: 0.5 mg/dL (ref 0.2–1.2)

## 2016-02-11 LAB — BASIC METABOLIC PANEL WITH GFR
BUN: 24 mg/dL (ref 7–25)
CHLORIDE: 101 mmol/L (ref 98–110)
CO2: 29 mmol/L (ref 20–31)
Calcium: 9.5 mg/dL (ref 8.6–10.4)
Creat: 1.34 mg/dL — ABNORMAL HIGH (ref 0.60–0.93)
GFR, Est African American: 45 mL/min — ABNORMAL LOW (ref >=60)
GFR, Est Non African American: 39 mL/min — ABNORMAL LOW (ref >=60)
GLUCOSE: 260 mg/dL — AB (ref 65–99)
POTASSIUM: 5.4 mmol/L — AB (ref 3.5–5.3)
Sodium: 139 mmol/L (ref 135–146)

## 2016-02-11 MED ORDER — METOLAZONE 2.5 MG PO TABS: 2.5000 mg | ORAL_TABLET | Freq: Every day | ORAL | 11 refills | Status: DC

## 2016-02-11 MED ORDER — POTASSIUM CHLORIDE CRYS ER 20 MEQ PO TBCR: 20.0000 meq | EXTENDED_RELEASE_TABLET | Freq: Two times a day (BID) | ORAL | 0 refills | Status: DC

## 2016-02-11 NOTE — Patient Instructions (Signed)
Please weigh yourself tomorrow first thing in the morning.  Please take 40 mg of lasix and 2.5 mg of the zaroxolyn.  Please weight yourself in the evening and let me know how much your weight changes.  Please take 2 tablets of the potassium with the lasix and the zaroxlyn.  Please start wearing your CPAP machine every night and when you are taking your naps during the daytime.  Please keep taking your insulin the same way.

## 2016-02-11 NOTE — Progress Notes (Signed)
Subjective:    Patient ID: Janet Choi, female    DOB: 09/17/1940, 75 y.o.   MRN: [id number]  HPI  Patient presents to the office to discuss her use of insulin.  She reports that since she started taking the insulin she feels even worse than she did before.  She reports that she is sleeping at her desk at work, she is hungry all the time.  She feels like somebody has beat her with a baseball  Bat and she feels like she cannot urinate. She reports that she is taking lasix 80 mg and she reports that she is swelling significantly.   She reports that she feels like she just dribbles and she cannot urinate.  She reports that she has significant swelling by the end of the day.  She felt so bad this morning she could not go to work.  She is not using her CPAP machine daily.  She reports that she is not using the machine.  She reports that she doesn't want to use it because the mask aggravates her.  She is coughing right now.  She did have an echo in March of 2017 and did have grade 1 diastolic dysfunction with preserved EF of 55-60%.  She has seen Dr. Johnsie Cancel in the past in the hospital.     Review of Systems  Constitutional: Negative for chills and fever.  HENT: Negative for congestion, ear pain, sore throat and trouble swallowing.   Eyes: Negative.   Respiratory: Negative for cough, shortness of breath and wheezing.   Cardiovascular: Negative for chest pain, palpitations and leg swelling.  Gastrointestinal: Negative for abdominal pain, blood in stool, constipation, diarrhea, nausea and vomiting.  Genitourinary: Negative.   Skin: Negative.   Neurological: Negative for dizziness and headaches.  Psychiatric/Behavioral: The patient is not nervous/anxious.        Objective:   Physical Exam  Constitutional: She is oriented to person, place, and time. She appears well-developed and well-nourished. No distress.  HENT:  Head: Normocephalic.  Mouth/Throat: Oropharynx is clear and moist. No  oropharyngeal exudate.  Mild periorbital edema  Eyes: Conjunctivae are normal. No scleral icterus.  Neck: Normal range of motion. Neck supple. No JVD present. No thyromegaly present.  Cardiovascular: Normal rate, regular rhythm, normal heart sounds and intact distal pulses.  Exam reveals no gallop and no friction rub.   No murmur heard. 2-3+ pitting edema in the pretibial area  Pulmonary/Chest: Effort normal and breath sounds normal. No respiratory distress. She has no wheezes. She has no rales. She exhibits no tenderness.  Abdominal: Soft. Bowel sounds are normal. She exhibits no distension and no mass. There is no tenderness. There is no rebound and no guarding.  Musculoskeletal: Normal range of motion.  Lymphadenopathy:    She has no cervical adenopathy.  Neurological: She is alert and oriented to person, place, and time. No cranial nerve deficit. Coordination normal.  Skin: Skin is warm and dry. She is not diaphoretic.  Psychiatric: Her speech is normal. Judgment and thought content normal. Her mood appears anxious. She is slowed. Cognition and memory are normal. She exhibits a depressed mood.  Nursing note and vitals reviewed.   Vitals:   02/11/16 1058  BP: 124/70  Pulse: 70  Resp: 16  Temp: 98.2 F (36.8 C)          Assessment & Plan:    1. Peripheral edema -possible heart failure given known diastolic dysfunction -already on diuretic but does not seem to  be doing much.  Will try to use zaroxolyn and will add on potassium to prevent hypokalemia -patient to call on Friday morning to let us know weight change.  If no relief with zaroxolyn may need to consider evaluation by urology for bladder obstruction vs. Neurogenic bladder - metolazone (ZAROXOLYN) 2.5 MG tablet; Take 1 tablet (2.5 mg total) by mouth daily.  Dispense: 30 tablet; Refill: 11 - potassium chloride SA (K-DUR,KLOR-CON) 20 MEQ tablet; Take 1 tablet (20 mEq total) by mouth 2 (two) times daily.  Dispense: 90  tablet; Refill: 0  2. Shortness of breath  - Brain natriuretic peptide [id number]  3. Medication management  - CBC with Differential/Platelet - BASIC METABOLIC PANEL WITH GFR - Hepatic function panel  4.  Type 2 diabetes insulin dependent -cont novolin -discussed that current effects are not coming insulin but are more likely CHF and encouraged to continue to work on her diet and moving around more.  5.  OSA on CPAP -not being complaint -feels that this is really related to not using CPAP and possible hypercarbia during the day causing daytime somnolence  6.  Non-compliance -likely secondary to severe depression  -have encouraged patient to work on compliance with our routine and she states that she will try to do better.

## 2016-02-12 ENCOUNTER — Ambulatory Visit: Payer: Self-pay | Admitting: Internal Medicine

## 2016-02-12 LAB — BRAIN NATRIURETIC PEPTIDE: Brain Natriuretic Peptide: 12.7 pg/mL (ref <100)

## 2016-02-18 ENCOUNTER — Ambulatory Visit (INDEPENDENT_AMBULATORY_CARE_PROVIDER_SITE_OTHER): Payer: PPO | Admitting: Internal Medicine

## 2016-02-18 ENCOUNTER — Encounter: Payer: Self-pay | Admitting: Internal Medicine

## 2016-02-18 VITALS — BP 122/62 | HR 88 | Temp 98.6°F | Resp 16 | Wt 232.0 lb

## 2016-02-18 DIAGNOSIS — R609 Edema, unspecified: Secondary | ICD-10-CM | POA: Diagnosis not present

## 2016-02-18 DIAGNOSIS — E1122 Type 2 diabetes mellitus with diabetic chronic kidney disease: Secondary | ICD-10-CM | POA: Diagnosis not present

## 2016-02-18 DIAGNOSIS — Z9989 Dependence on other enabling machines and devices: Secondary | ICD-10-CM | POA: Diagnosis not present

## 2016-02-18 DIAGNOSIS — G4733 Obstructive sleep apnea (adult) (pediatric): Secondary | ICD-10-CM | POA: Diagnosis not present

## 2016-02-18 DIAGNOSIS — N183 Chronic kidney disease, stage 3 (moderate): Secondary | ICD-10-CM | POA: Diagnosis not present

## 2016-02-18 MED ORDER — DIAZEPAM 5 MG PO TABS: 5.0000 mg | ORAL_TABLET | Freq: Every evening | ORAL | 2 refills | Status: DC | PRN

## 2016-02-18 NOTE — Progress Notes (Signed)
Subjective:    Patient ID: Janet Choi, female    DOB: 19-Apr-1941, 75 y.o.   MRN: [id number]  HPI  Patient presents to the office for 1 week follow-up of swelling of the legs.  She reports that the swelling is improved after taking a single dose of zaroxolyn.  She reports that the zaroxolyn didn't help much.  She did stop the meloxicam and also stopped taking the losartan.  She reports that she has been drinking water.  She reports that she forgot to tell us that she went back on the meloxicam.  She reports that she is not taking tylenol either because she thought that this was hurting her kidneys too. She does not want to take the losartan anymore at all.  She reports that if we change the insulin it will make it better.  She reports that she is getting 4-6 hours of using her CPAP on since our visit last week.  She reports that she feels less tired with the medication on board.   She also reports that she stopped taking the tizanidine.  She reports that it made her feel drunk.  She reports that she doesn't want to take it anymore.  She did have valium and took that instead and did much better with.  She reports that she was falling on the tizanidine.    She is supposed to see Dr. Renne Crigler in November.    Review of Systems  Constitutional: Negative for chills and fever.  HENT: Negative for congestion, ear pain and sore throat.   Eyes: Negative.   Respiratory: Negative for cough, shortness of breath and wheezing.   Cardiovascular: Negative for chest pain, palpitations and leg swelling.  Gastrointestinal: Negative for abdominal pain, blood in stool, constipation and diarrhea.  Genitourinary: Negative.   Skin: Negative.   Neurological: Negative for dizziness and headaches.  Psychiatric/Behavioral: The patient is not nervous/anxious.        Objective:   Physical Exam  Constitutional: She is oriented to person, place, and time. She appears well-developed and well-nourished. No distress.   HENT:  Head: Normocephalic.  Mouth/Throat: Oropharynx is clear and moist. No oropharyngeal exudate.  Eyes: Conjunctivae are normal. No scleral icterus.  Neck: Normal range of motion. Neck supple. No JVD present. No thyromegaly present.  Cardiovascular: Normal rate, regular rhythm and intact distal pulses.  Exam reveals no gallop and no friction rub.   No murmur heard. Pulmonary/Chest: Effort normal and breath sounds normal. No respiratory distress. She has no wheezes. She has no rales. She exhibits no tenderness.  Abdominal: Soft. Bowel sounds are normal. She exhibits no distension and no mass. There is no tenderness. There is no rebound and no guarding.  Musculoskeletal: Normal range of motion.  Lymphadenopathy:    She has no cervical adenopathy.  Neurological: She is alert and oriented to person, place, and time. No cranial nerve deficit. Coordination normal.  Skin: Skin is warm and dry. No rash noted. She is not diaphoretic. No erythema. No pallor.  Psychiatric: She has a normal mood and affect. Her behavior is normal. Judgment and thought content normal.  Nursing note and vitals reviewed.   Vitals:   02/18/16 1346  BP: 122/62  Pulse: 88  Resp: 16  Temp: 98.6 F (37 C)         Assessment & Plan:    1. OSA on CPAP -is getting better with use -will give 2.5 mg of valium to help with sleep -she reports that she  will try to keep wearing  2. Controlled type 2 diabetes mellitus with stage 3 chronic kidney disease, without long-term current use of insulin ([place]) -encouraged to start using losartan 25 mg at least for kidney protection and treating CHF   3. Peripheral edema -Has improved with stopping meloxicam -no relief with zaroxolyn -cont lasix -elevate legs -compression socks

## 2016-02-18 NOTE — Patient Instructions (Signed)
Please restart a half tablet of the losartan.  This will help to protect your kidneys.  Please take 1,000 mg of Tylenol every 8 hours as needed for arthritis pain.  Please take only a half tablet of the valium at bedtime.  Keep up the good work using the CPAP machine.

## 2016-02-29 DIAGNOSIS — G4733 Obstructive sleep apnea (adult) (pediatric): Secondary | ICD-10-CM | POA: Diagnosis not present

## 2016-03-02 ENCOUNTER — Other Ambulatory Visit: Payer: Self-pay | Admitting: Internal Medicine

## 2016-03-16 ENCOUNTER — Encounter: Payer: Self-pay | Admitting: Internal Medicine

## 2016-03-16 ENCOUNTER — Ambulatory Visit (INDEPENDENT_AMBULATORY_CARE_PROVIDER_SITE_OTHER): Payer: PPO | Admitting: Internal Medicine

## 2016-03-16 VITALS — BP 134/72 | HR 73 | Ht 62.5 in | Wt 232.0 lb

## 2016-03-16 DIAGNOSIS — E1165 Type 2 diabetes mellitus with hyperglycemia: Secondary | ICD-10-CM

## 2016-03-16 DIAGNOSIS — E1142 Type 2 diabetes mellitus with diabetic polyneuropathy: Secondary | ICD-10-CM

## 2016-03-16 LAB — POCT GLYCOSYLATED HEMOGLOBIN (HGB A1C): HEMOGLOBIN A1C: 9.7

## 2016-03-16 MED ORDER — INSULIN NPH ISOPHANE & REGULAR (70-30) 100 UNIT/ML ~~LOC~~ SUSP: SUBCUTANEOUS | 1 refills | Status: DC

## 2016-03-16 MED ORDER — "INSULIN SYRINGE-NEEDLE U-100 31G X 15/64"" 1 ML MISC": 4 refills | Status: DC

## 2016-03-16 NOTE — Patient Instructions (Addendum)
Please change the Novolin 70/30 as follows: - move the doses 30 min before a meal - change the dose: * 40 units before b'fast * 55 units before dinner tonight and if no lows >> 60 units before dinner  Please let me know if the sugars are consistently <80 or >200.  Please return in 1.5 months with your sugar log.   PATIENT INSTRUCTIONS FOR TYPE 2 DIABETES:  **Please join MyChart!** - see attached instructions about how to join if you have not done so already.  DIET AND EXERCISE Diet and exercise is an important part of diabetic treatment.  We recommended aerobic exercise in the form of brisk walking (working between 40-60% of maximal aerobic capacity, similar to brisk walking) for 150 minutes per week (such as 30 minutes five days per week) along with 3 times per week performing 'resistance' training (using various gauge rubber tubes with handles) 5-10 exercises involving the major muscle groups (upper body, lower body and core) performing 10-15 repetitions (or near fatigue) each exercise. Start at half the above goal but build slowly to reach the above goals. If limited by weight, joint pain, or disability, we recommend daily walking in a swimming pool with water up to waist to reduce pressure from joints while allow for adequate exercise.    BLOOD GLUCOSES Monitoring your blood glucoses is important for continued management of your diabetes. Please check your blood glucoses 2-4 times a day: fasting, before meals and at bedtime (you can rotate these measurements - e.g. one day check before the 3 meals, the next day check before 2 of the meals and before bedtime, etc.).   HYPOGLYCEMIA (low blood sugar) Hypoglycemia is usually a reaction to not eating, exercising, or taking too much insulin/ other diabetes drugs.  Symptoms include tremors, sweating, hunger, confusion, headache, etc. Treat IMMEDIATELY with 15 grams of Carbs: . 4 glucose tablets .  cup regular juice/soda . 2 tablespoons  raisins . 4 teaspoons sugar . 1 tablespoon honey Recheck blood glucose in 15 mins and repeat above if still symptomatic/blood glucose <100.  RECOMMENDATIONS TO REDUCE YOUR RISK OF DIABETIC COMPLICATIONS: * Take your prescribed MEDICATION(S) * Follow a DIABETIC diet: Complex carbs, fiber rich foods, (monounsaturated and polyunsaturated) fats * AVOID saturated/trans fats, high fat foods, >2,300 mg salt per day. * EXERCISE at least 5 times a week for 30 minutes or preferably daily.  * DO NOT SMOKE OR DRINK more than 1 drink a day. * Check your FEET every day. Do not wear tightfitting shoes. Contact us if you develop an ulcer * See your EYE doctor once a year or more if needed * Get a FLU shot once a year * Get a PNEUMONIA vaccine once before and once after age 86 years  GOALS:  * Your Hemoglobin A1c of <7%  * fasting sugars need to be <130 * after meals sugars need to be <180 (2h after you start eating) * Your Systolic BP should be [id number] or lower  * Your Diastolic BP should be 80 or lower  * Your HDL (Good Cholesterol) should be 40 or higher  * Your LDL (Bad Cholesterol) should be 100 or lower. * Your Triglycerides should be 150 or lower  * Your Urine microalbumin (kidney function) should be <30 * Your Body Mass Index should be 25 or lower    Please consider the following ways to cut down carbs and fat and increase fiber and micronutrients in your diet: - substitute whole grain for white  bread or pasta - substitute brown rice for white rice - substitute 90-calorie flat bread pieces for slices of bread when possible - substitute sweet potatoes or yams for white potatoes - substitute humus for margarine - substitute tofu for cheese when possible - substitute almond or rice milk for regular milk (would not drink soy milk daily due to concern for soy estrogen influence on breast cancer risk) - substitute dark chocolate for other sweets when possible - substitute water - can add lemon or  orange slices for taste - for diet sodas (artificial sweeteners will trick your body that you can eat sweets without getting calories and will lead you to overeating and weight gain in the long run) - do not skip breakfast or other meals (this will slow down the metabolism and will result in more weight gain over time)  - can try smoothies made from fruit and almond/rice milk in am instead of regular breakfast - can also try old-fashioned (not instant) oatmeal made with almond/rice milk in am - order the dressing on the side when eating salad at a restaurant (pour less than half of the dressing on the salad) - eat as little meat as possible - can try juicing, but should not forget that juicing will get rid of the fiber, so would alternate with eating raw veg./fruits or drinking smoothies - use as little oil as possible, even when using olive oil - can dress a salad with a mix of balsamic vinegar and lemon juice, for e.g. - use agave nectar, stevia sugar, or regular sugar rather than artificial sweateners - steam or broil/roast veggies  - snack on veggies/fruit/nuts (unsalted, preferably) when possible, rather than processed foods - reduce or eliminate aspartame in diet (it is in diet sodas, chewing gum, etc) Read the labels!  Try to read Dr. Janene Harvey book: "Program for Reversing Diabetes" for other ideas for healthy eating.

## 2016-03-16 NOTE — Progress Notes (Signed)
Patient ID: Janet Choi, female   DOB: 1940-12-18, 75 y.o.   MRN: [id number]  HPI: Janet Choi is a 75 y.o.-year-old female, referred by her PCP, Dr. Melford Aase, for management of DM2, dx in ~2009, insulin-dependent since 10/2015, uncontrolled, with complications (CKD, PN). She is here with her sister who offers part of the hx.  Last hemoglobin A1c was: Lab Results  Component Value Date   HGBA1C 11.1 (H) 12/19/2015   HGBA1C 13.2 (H) 10/21/2015   HGBA1C 11.5 (H) 09/26/2015   Pt is on a regimen of: - Novolin 70/30 50 mg 2x a day, no relationship with the meals (!) Previously: Metformin and Glimepiride. She was on Victoza.  Pt checks her sugars 1-2x a day and they are: - am: 109-263, 325 - 2h after b'fast: n/c - before lunch: n/c - 2h after lunch: 237 - before dinner: 181 - 2h after dinner: n/c - bedtime: n/c - nighttime: n/c No lows. Lowest sugar was 325; ? hypoglycemia awareness. Highest sugar was 109 recently, prev. 86.  Glucometer: One Touch   Pt's meals are: - Breakfast: bagel or 2 pieces toast; coffee + sugar + powdered creamer - Lunch: 1/2 PB sandwich or goes out - shares a plate with her sister - Dinner: mac and cheese + veggies; chicken + veggies + some starch - Snacks: after dinner - popcorn, peanuts, sunflower seeds, candy  - + CKD, last BUN/creatinine:  Lab Results  Component Value Date   BUN 24 02/11/2016   BUN 27 (H) 12/19/2015   CREATININE 1.34 (H) 02/11/2016   CREATININE 1.21 (H) 12/19/2015  On Losartan.  - last set of lipids: Lab Results  Component Value Date   CHOL 360 (H) 10/21/2015   HDL 38 (L) 10/21/2015   LDLCALC NOT CALC 10/21/2015   TRIG 658 (H) 10/21/2015   CHOLHDL 9.5 (H) 10/21/2015   - last eye exam was on 02/2016. No DR. + Glaucoma. Dr. Satira Sark. - + numbness and tingling in her feet. Has a podiatrist (Dr. Bjorn Loser).  Pt has no FH of DM.  She also has a history of HL, anxiety/depression.  ROS: Constitutional: + weight gain, +  increased appetite, no fatigue, no subjective hyperthermia/hypothermia Eyes: no blurry vision, no xerophthalmia ENT: no sore throat, no nodules palpated in throat, no dysphagia/odynophagia, no hoarseness Cardiovascular: no CP/SOB/palpitations/+ leg swelling Respiratory: + cough/no SOB Gastrointestinal: no N/V/D/C Musculoskeletal: + Both: muscle/joint aches Skin: no rashes, + easy bruising, + itching Neurological: no tremors/numbness/tingling/dizziness, + headache Psychiatric: + Both: depression/anxiety  Past Medical History:  Diagnosis Date  . Anxiety   . Diabetes mellitus without complication ([place])   . DJD (degenerative joint disease)   . Hyperlipidemia   . Hypertension   . IBS (irritable bowel syndrome)   . Obesity   . OSA (obstructive sleep apnea)   . RLS (restless legs syndrome)   . Vitamin D deficiency    Past Surgical History:  Procedure Laterality Date  . APPENDECTOMY    . CATARACT EXTRACTION Bilateral   . LUMBAR DISC SURGERY     x 2  . ROTATOR CUFF REPAIR Bilateral   . TOTAL KNEE ARTHROPLASTY Bilateral    Social History   Social History  . Marital status: Single    Spouse name: N/A  . Number of children: 1   Occupational History  . Retired, works part-time, 16 hours a week    Social History Main Topics  . Smoking status: Former Smoker    Quit date: 05/11/1983  .  Smokeless tobacco: Never Used  . Alcohol use No  . Drug use: No   Current Outpatient Prescriptions on File Prior to Visit  Medication Sig Dispense Refill  . aspirin EC 81 MG tablet Take 81 mg by mouth daily.    . BD PEN NEEDLE NANO U/F 32G X 4 MM MISC 2 (two) times daily. as directed  2  . FLUoxetine (PROZAC) 40 MG capsule TAKE 1 CAPSULE BY MOUTH DAILY. 90 capsule 1  . furosemide (LASIX) 40 MG tablet TAKE 1 TABLET 3 TIMES A DAY BEFORE MEALS FOR EDEMA ( STOP TAKING HCTZ) 270 tablet 1  . losartan (COZAAR) 50 MG tablet TAKE 1 TABLET BY MOUTH DAILY FOR BLOOD PRESSURE AND KIDNEY 90 tablet 2  . NOVOLIN  70/30 (70-30) 100 UNIT/ML injection IMJECT 50 UNITS TWICE DAILY 30 mL 1  . OVER THE COUNTER MEDICATION 150 mg at bedtime. [facility] acid reduced 75 mg at bedtime    . rOPINIRole (REQUIP) 3 MG tablet Take 1 tablet 4 x / day for Restless Legs 360 tablet 1  . Cholecalciferol (VITAMIN D) 2000 UNITS tablet Take 10,000 Units by mouth daily.     . diazepam (VALIUM) 5 MG tablet Take 1 tablet (5 mg total) by mouth at bedtime as needed for anxiety. (Patient not taking: Reported on 03/16/2016) 30 tablet 2  . FLECTOR 1.3 % PTCH APPLY 1 PATCH EVERY 12 HOURS AS NEEDED THEN TAKE OFF FOR 12 HOURS  3  . gabapentin (NEURONTIN) 600 MG tablet Take 1/2 to 1 tablet 3 x /daily for Pain. 270 tablet 1  . Glucosamine-MSM-Hyaluronic Acd (JOINT HEALTH PO) Take by mouth daily.    Marland Kitchen glucose blood (FREESTYLE LITE) test strip Check blood sugar 3 x / day as instructed (Patient not taking: Reported on 03/16/2016) 300 each 1  . Lactobacillus (PROBIOTIC ACIDOPHILUS PO) Take by mouth.    . Magnesium 400 MG CAPS Take 1 capsule by mouth 2 (two) times daily.     . metolazone (ZAROXOLYN) 2.5 MG tablet Take 1 tablet (2.5 mg total) by mouth daily. (Patient not taking: Reported on 03/16/2016) 30 tablet 11  . OVER THE COUNTER MEDICATION daily. Slow Released Iron    . potassium chloride SA (K-DUR,KLOR-CON) 20 MEQ tablet Take 1 tablet (20 mEq total) by mouth 2 (two) times daily. (Patient not taking: Reported on 03/16/2016) 90 tablet 0  . rizatriptan (MAXALT) 10 MG tablet Take 1 tablet (10 mg total) by mouth once as needed for migraine. May repeat in 2 hours if needed (Patient not taking: Reported on 03/16/2016) 10 tablet 2   No current facility-administered medications on file prior to visit.    Allergies  Allergen Reactions  . Aspirin     Large doses per patient  . Crestor [Rosuvastatin]     Leg crams  . Lipitor [Atorvastatin]     Leg crams  . Lyrica [Pregabalin] Other (See Comments)    Blurred vision  . Minocycline Hcl     REACTION: Reaction  not known  . Oruvail [Ketoprofen]   . Tricor [Fenofibrate]   . Vasotec [Enalaprilat]   . Victoza [Liraglutide]   . Zinc    Family History  Problem Relation Age of Onset  . Heart disease Mother   . Colon cancer Neg Hx   . Colon polyps Neg Hx   . Esophageal cancer Neg Hx   . Pancreatic cancer Neg Hx   . Stomach cancer Neg Hx   . Kidney disease Father   . Liver  disease Neg Hx   . Diabetes Neg Hx     PE: BP 134/72 (BP Location: Left Arm, Patient Position: Sitting)   Pulse 73   Ht 5' 2.5" (1.588 m)   Wt 232 lb (105.2 kg)   SpO2 94%   BMI 41.76 kg/m  Wt Readings from Last 3 Encounters:  03/16/16 232 lb (105.2 kg)  02/18/16 232 lb (105.2 kg)  12/19/15 224 lb (101.6 kg)   Constitutional:Obese, in NAD Eyes: PERRLA, EOMI, no exophthalmos ENT: moist mucous membranes, no thyromegaly, no cervical lymphadenopathy Cardiovascular: RRR, No MRG, + mild Pitting edema bilaterally Respiratory: CTA B Gastrointestinal: abdomen soft, NT, ND, BS+ Musculoskeletal: no deformities, strength intact in all 4 Skin: moist, warm, no rashes Neurological: no tremor with outstretched hands, DTR normal in all 4  ASSESSMENT: 1. DM2, insulin-dependent, uncontrolled, with complications - PN - CKD  PLAN:  1. Patient with long-standing, uncontrolled diabetes, on oral antidiabetic regimen, with recent addition of insulin. HbA1c 9.7% today (better). - she is taking her insulin w/o a relationship with the meals, which is conducive to large fluctuations in her sugars. The first thing that we need to do is to move the insulin 30 minutes before meals. Also, since she is eating much more at night than during the day, I will decrease the dose of insulin with breakfast and increased, with dinner. She has a lot of snacks after dinner, which I strongly advised her to try to reduce. She is interested in a referral to nutrition today, which we will do, but we also spent time discussing healthy diet substitutions.  - We  did discuss about the fact that we may not be able to bring her control to the target level on premixed insulin regimen, and we may need to split into a basal-bolus regimen. For now, will try to optimize the way she is taking the insulin and see if we need to switch to a more intensive regimen at next visit - I suggested to:  Patient Instructions  Please change the Novolin 70/30 as follows: - move the doses 30 min before a meal - change the dose: * 40 units before b'fast * 55 units before dinner tonight and if no lows >> 60 units before dinner  Please let me know if the sugars are consistently <80 or >200.  Please return in 1.5 months with your sugar log.   - Strongly advised her to start checking sugars at different times of the day - check 2 times a day, rotating checks - given sugar log and advised how to fill it and to bring it at next appt  - given foot care handout and explained the principles  - given instructions for hypoglycemia management "15-15 rule"  - advised for yearly eye exams >> She is up-to-date - Return to clinic in 1.5 mo with sugar log   Philemon Kingdom, MD PhD [facility] Endocrinology

## 2016-03-16 NOTE — Addendum Note (Signed)
Addended by: Caprice Beaver T on: 03/16/2016 12:58 PM   Modules accepted: Orders

## 2016-03-18 DIAGNOSIS — M7061 Trochanteric bursitis, right hip: Secondary | ICD-10-CM | POA: Diagnosis not present

## 2016-03-18 DIAGNOSIS — M5441 Lumbago with sciatica, right side: Secondary | ICD-10-CM | POA: Diagnosis not present

## 2016-03-18 DIAGNOSIS — M545 Low back pain: Secondary | ICD-10-CM | POA: Diagnosis not present

## 2016-03-22 ENCOUNTER — Other Ambulatory Visit: Payer: Self-pay | Admitting: Orthopedic Surgery

## 2016-03-22 DIAGNOSIS — IMO0002 Reserved for concepts with insufficient information to code with codable children: Secondary | ICD-10-CM

## 2016-03-22 DIAGNOSIS — M4800 Spinal stenosis, site unspecified: Secondary | ICD-10-CM

## 2016-03-30 ENCOUNTER — Ambulatory Visit
Admission: RE | Admit: 2016-03-30 | Discharge: 2016-03-30 | Disposition: A | Discharge status: Home / Self Care | Payer: PPO | Source: Ambulatory Visit | Attending: Orthopedic Surgery | Admitting: Orthopedic Surgery

## 2016-03-30 DIAGNOSIS — M5126 Other intervertebral disc displacement, lumbar region: Secondary | ICD-10-CM | POA: Diagnosis not present

## 2016-03-30 DIAGNOSIS — M4800 Spinal stenosis, site unspecified: Secondary | ICD-10-CM

## 2016-03-30 DIAGNOSIS — IMO0002 Reserved for concepts with insufficient information to code with codable children: Secondary | ICD-10-CM

## 2016-03-31 ENCOUNTER — Other Ambulatory Visit: Payer: PPO

## 2016-03-31 DIAGNOSIS — G4733 Obstructive sleep apnea (adult) (pediatric): Secondary | ICD-10-CM | POA: Diagnosis not present

## 2016-04-06 DIAGNOSIS — M545 Low back pain: Secondary | ICD-10-CM | POA: Diagnosis not present

## 2016-04-06 DIAGNOSIS — M5441 Lumbago with sciatica, right side: Secondary | ICD-10-CM | POA: Diagnosis not present

## 2016-04-07 DIAGNOSIS — M47816 Spondylosis without myelopathy or radiculopathy, lumbar region: Secondary | ICD-10-CM | POA: Diagnosis not present

## 2016-04-07 DIAGNOSIS — M533 Sacrococcygeal disorders, not elsewhere classified: Secondary | ICD-10-CM | POA: Diagnosis not present

## 2016-04-10 ENCOUNTER — Other Ambulatory Visit: Payer: Self-pay | Admitting: Physician Assistant

## 2016-04-13 DIAGNOSIS — M533 Sacrococcygeal disorders, not elsewhere classified: Secondary | ICD-10-CM | POA: Diagnosis not present

## 2016-04-14 ENCOUNTER — Emergency Department (HOSPITAL_COMMUNITY): Payer: PPO

## 2016-04-14 ENCOUNTER — Emergency Department (HOSPITAL_COMMUNITY)
Admit: 2016-04-14 | Discharge: 2016-04-14 | Disposition: A | Discharge status: Home / Self Care | Payer: PPO | Attending: Cardiology | Admitting: Cardiology

## 2016-04-14 ENCOUNTER — Encounter (HOSPITAL_COMMUNITY): Payer: Self-pay | Admitting: Family Medicine

## 2016-04-14 ENCOUNTER — Inpatient Hospital Stay (HOSPITAL_COMMUNITY)
Admission: EM | Admit: 2016-04-14 | Discharge: 2016-04-16 | DRG: 247 | Disposition: A | Discharge status: Home / Self Care | Payer: PPO | Attending: Internal Medicine | Admitting: Internal Medicine

## 2016-04-14 DIAGNOSIS — E1142 Type 2 diabetes mellitus with diabetic polyneuropathy: Secondary | ICD-10-CM | POA: Diagnosis not present

## 2016-04-14 DIAGNOSIS — Z96653 Presence of artificial knee joint, bilateral: Secondary | ICD-10-CM | POA: Diagnosis present

## 2016-04-14 DIAGNOSIS — I2510 Atherosclerotic heart disease of native coronary artery without angina pectoris: Secondary | ICD-10-CM

## 2016-04-14 DIAGNOSIS — I119 Hypertensive heart disease without heart failure: Secondary | ICD-10-CM | POA: Diagnosis not present

## 2016-04-14 DIAGNOSIS — R079 Chest pain, unspecified: Secondary | ICD-10-CM | POA: Diagnosis not present

## 2016-04-14 DIAGNOSIS — Z6841 Body Mass Index (BMI) 40.0 and over, adult: Secondary | ICD-10-CM | POA: Diagnosis not present

## 2016-04-14 DIAGNOSIS — I10 Essential (primary) hypertension: Secondary | ICD-10-CM

## 2016-04-14 DIAGNOSIS — E559 Vitamin D deficiency, unspecified: Secondary | ICD-10-CM | POA: Diagnosis present

## 2016-04-14 DIAGNOSIS — G4733 Obstructive sleep apnea (adult) (pediatric): Secondary | ICD-10-CM | POA: Diagnosis not present

## 2016-04-14 DIAGNOSIS — Z7982 Long term (current) use of aspirin: Secondary | ICD-10-CM

## 2016-04-14 DIAGNOSIS — M79609 Pain in unspecified limb: Secondary | ICD-10-CM | POA: Diagnosis not present

## 2016-04-14 DIAGNOSIS — Z9989 Dependence on other enabling machines and devices: Secondary | ICD-10-CM

## 2016-04-14 DIAGNOSIS — E1165 Type 2 diabetes mellitus with hyperglycemia: Secondary | ICD-10-CM

## 2016-04-14 DIAGNOSIS — E1122 Type 2 diabetes mellitus with diabetic chronic kidney disease: Secondary | ICD-10-CM | POA: Diagnosis present

## 2016-04-14 DIAGNOSIS — R001 Bradycardia, unspecified: Secondary | ICD-10-CM | POA: Diagnosis not present

## 2016-04-14 DIAGNOSIS — M545 Low back pain: Secondary | ICD-10-CM | POA: Diagnosis not present

## 2016-04-14 DIAGNOSIS — E785 Hyperlipidemia, unspecified: Secondary | ICD-10-CM | POA: Diagnosis present

## 2016-04-14 DIAGNOSIS — M549 Dorsalgia, unspecified: Secondary | ICD-10-CM | POA: Diagnosis present

## 2016-04-14 DIAGNOSIS — E782 Mixed hyperlipidemia: Secondary | ICD-10-CM

## 2016-04-14 DIAGNOSIS — G2581 Restless legs syndrome: Secondary | ICD-10-CM | POA: Diagnosis present

## 2016-04-14 DIAGNOSIS — Z886 Allergy status to analgesic agent status: Secondary | ICD-10-CM

## 2016-04-14 DIAGNOSIS — G8929 Other chronic pain: Secondary | ICD-10-CM | POA: Diagnosis not present

## 2016-04-14 DIAGNOSIS — E669 Obesity, unspecified: Secondary | ICD-10-CM | POA: Diagnosis not present

## 2016-04-14 DIAGNOSIS — K589 Irritable bowel syndrome without diarrhea: Secondary | ICD-10-CM | POA: Diagnosis not present

## 2016-04-14 DIAGNOSIS — Z794 Long term (current) use of insulin: Secondary | ICD-10-CM | POA: Diagnosis not present

## 2016-04-14 DIAGNOSIS — N183 Chronic kidney disease, stage 3 unspecified: Secondary | ICD-10-CM | POA: Diagnosis present

## 2016-04-14 DIAGNOSIS — M1990 Unspecified osteoarthritis, unspecified site: Secondary | ICD-10-CM | POA: Diagnosis present

## 2016-04-14 DIAGNOSIS — M7989 Other specified soft tissue disorders: Secondary | ICD-10-CM

## 2016-04-14 DIAGNOSIS — E1169 Type 2 diabetes mellitus with other specified complication: Secondary | ICD-10-CM | POA: Diagnosis present

## 2016-04-14 DIAGNOSIS — E538 Deficiency of other specified B group vitamins: Secondary | ICD-10-CM | POA: Diagnosis not present

## 2016-04-14 DIAGNOSIS — I110 Hypertensive heart disease with heart failure: Secondary | ICD-10-CM | POA: Diagnosis not present

## 2016-04-14 DIAGNOSIS — Z8249 Family history of ischemic heart disease and other diseases of the circulatory system: Secondary | ICD-10-CM | POA: Diagnosis not present

## 2016-04-14 DIAGNOSIS — Z841 Family history of disorders of kidney and ureter: Secondary | ICD-10-CM | POA: Diagnosis not present

## 2016-04-14 DIAGNOSIS — Z79899 Other long term (current) drug therapy: Secondary | ICD-10-CM

## 2016-04-14 DIAGNOSIS — F411 Generalized anxiety disorder: Secondary | ICD-10-CM | POA: Diagnosis not present

## 2016-04-14 DIAGNOSIS — I214 Non-ST elevation (NSTEMI) myocardial infarction: Principal | ICD-10-CM

## 2016-04-14 DIAGNOSIS — I1310 Hypertensive heart and chronic kidney disease without heart failure, with stage 1 through stage 4 chronic kidney disease, or unspecified chronic kidney disease: Secondary | ICD-10-CM | POA: Diagnosis present

## 2016-04-14 DIAGNOSIS — R0602 Shortness of breath: Secondary | ICD-10-CM | POA: Diagnosis not present

## 2016-04-14 DIAGNOSIS — E611 Iron deficiency: Secondary | ICD-10-CM | POA: Diagnosis not present

## 2016-04-14 DIAGNOSIS — Z955 Presence of coronary angioplasty implant and graft: Secondary | ICD-10-CM

## 2016-04-14 DIAGNOSIS — Z87891 Personal history of nicotine dependence: Secondary | ICD-10-CM | POA: Diagnosis not present

## 2016-04-14 DIAGNOSIS — D649 Anemia, unspecified: Secondary | ICD-10-CM | POA: Diagnosis present

## 2016-04-14 DIAGNOSIS — R748 Abnormal levels of other serum enzymes: Secondary | ICD-10-CM | POA: Diagnosis not present

## 2016-04-14 DIAGNOSIS — R0789 Other chest pain: Secondary | ICD-10-CM | POA: Diagnosis not present

## 2016-04-14 DIAGNOSIS — Z9861 Coronary angioplasty status: Secondary | ICD-10-CM

## 2016-04-14 DIAGNOSIS — Z888 Allergy status to other drugs, medicaments and biological substances status: Secondary | ICD-10-CM | POA: Diagnosis not present

## 2016-04-14 DIAGNOSIS — F419 Anxiety disorder, unspecified: Secondary | ICD-10-CM | POA: Diagnosis not present

## 2016-04-14 DIAGNOSIS — I252 Old myocardial infarction: Secondary | ICD-10-CM | POA: Diagnosis present

## 2016-04-14 DIAGNOSIS — Z79891 Long term (current) use of opiate analgesic: Secondary | ICD-10-CM

## 2016-04-14 HISTORY — DX: Personal history of other diseases of the musculoskeletal system and connective tissue: Z87.39

## 2016-04-14 HISTORY — DX: Low back pain: M54.5

## 2016-04-14 HISTORY — DX: Low back pain, unspecified: M54.50

## 2016-04-14 HISTORY — DX: Depression, unspecified: F32.A

## 2016-04-14 HISTORY — DX: Malignant melanoma of scalp and neck: C43.4

## 2016-04-14 HISTORY — DX: Major depressive disorder, single episode, unspecified: F32.9

## 2016-04-14 HISTORY — DX: Gastro-esophageal reflux disease without esophagitis: K21.9

## 2016-04-14 HISTORY — DX: Obstructive sleep apnea (adult) (pediatric): G47.33

## 2016-04-14 HISTORY — DX: Family history of other specified conditions: Z84.89

## 2016-04-14 HISTORY — DX: Type 2 diabetes mellitus without complications: E11.9

## 2016-04-14 HISTORY — DX: Unspecified osteoarthritis, unspecified site: M19.90

## 2016-04-14 HISTORY — DX: Anemia, unspecified: D64.9

## 2016-04-14 HISTORY — DX: Non-ST elevation (NSTEMI) myocardial infarction: I21.4

## 2016-04-14 HISTORY — DX: Basal cell carcinoma of skin, unspecified: C44.91

## 2016-04-14 HISTORY — DX: Dependence on other enabling machines and devices: Z99.89

## 2016-04-14 HISTORY — DX: Other chronic pain: G89.29

## 2016-04-14 LAB — BASIC METABOLIC PANEL
ANION GAP: 9 (ref 5–15)
BUN: 22 mg/dL — ABNORMAL HIGH (ref 6–20)
CALCIUM: 9.5 mg/dL (ref 8.9–10.3)
CO2: 25 mmol/L (ref 22–32)
Chloride: 104 mmol/L (ref 101–111)
Creatinine, Ser: 1.21 mg/dL — ABNORMAL HIGH (ref 0.44–1.00)
GFR, EST AFRICAN AMERICAN: 49 mL/min — AB (ref >60)
GFR, EST NON AFRICAN AMERICAN: 43 mL/min — AB (ref >60)
Glucose, Bld: 337 mg/dL — ABNORMAL HIGH (ref 65–99)
POTASSIUM: 4.5 mmol/L (ref 3.5–5.1)
SODIUM: 138 mmol/L (ref 135–145)

## 2016-04-14 LAB — CBC
HEMATOCRIT: 39.1 % (ref 36.0–46.0)
HEMOGLOBIN: 12.7 g/dL (ref 12.0–15.0)
MCH: 28.6 pg (ref 26.0–34.0)
MCHC: 32.5 g/dL (ref 30.0–36.0)
MCV: 88.1 fL (ref 78.0–100.0)
Platelets: 259 10*3/uL (ref 150–400)
RBC: 4.44 MIL/uL (ref 3.87–5.11)
RDW: 12.6 % (ref 11.5–15.5)
WBC: 6.3 10*3/uL (ref 4.0–10.5)

## 2016-04-14 LAB — I-STAT TROPONIN, ED: TROPONIN I, POC: 2.47 ng/mL — AB (ref 0.00–0.08)

## 2016-04-14 LAB — TROPONIN I: Troponin I: 4.41 ng/mL (ref <0.03)

## 2016-04-14 LAB — GLUCOSE, CAPILLARY: GLUCOSE-CAPILLARY: 217 mg/dL — AB (ref 65–99)

## 2016-04-14 LAB — D-DIMER, QUANTITATIVE (NOT AT ARMC): D DIMER QUANT: 0.99 ug{FEU}/mL — AB (ref 0.00–0.50)

## 2016-04-14 LAB — BRAIN NATRIURETIC PEPTIDE: B Natriuretic Peptide: 488.6 pg/mL — ABNORMAL HIGH (ref 0.0–100.0)

## 2016-04-14 LAB — CBG MONITORING, ED: Glucose-Capillary: 236 mg/dL — ABNORMAL HIGH (ref 65–99)

## 2016-04-14 MED ORDER — INSULIN ASPART 100 UNIT/ML ~~LOC~~ SOLN
7.0000 [IU] | Freq: Once | SUBCUTANEOUS | Status: AC
  Administered 2016-04-14: 7 [IU] via SUBCUTANEOUS
  Filled 2016-04-14: qty 1

## 2016-04-14 MED ORDER — FLUOXETINE HCL 20 MG PO CAPS
40.0000 mg | ORAL_CAPSULE | Freq: Every day | ORAL | Status: DC
  Administered 2016-04-16: 40 mg via ORAL
  Filled 2016-04-14: qty 2

## 2016-04-14 MED ORDER — HEPARIN BOLUS VIA INFUSION
4000.0000 [IU] | Freq: Once | INTRAVENOUS | Status: AC
Start: 2016-04-14 — End: 2016-04-14
  Administered 2016-04-14: 4000 [IU] via INTRAVENOUS
  Filled 2016-04-14: qty 4000

## 2016-04-14 MED ORDER — ASPIRIN EC 81 MG PO TBEC
81.0000 mg | DELAYED_RELEASE_TABLET | Freq: Every day | ORAL | Status: DC
  Administered 2016-04-15 – 2016-04-16 (×2): 81 mg via ORAL
  Filled 2016-04-14 (×2): qty 1

## 2016-04-14 MED ORDER — GABAPENTIN 600 MG PO TABS
300.0000 mg | ORAL_TABLET | Freq: Every day | ORAL | Status: DC
  Administered 2016-04-14 – 2016-04-15 (×2): 300 mg via ORAL
  Filled 2016-04-14 (×2): qty 1

## 2016-04-14 MED ORDER — INSULIN ASPART 100 UNIT/ML ~~LOC~~ SOLN
0.0000 [IU] | SUBCUTANEOUS | Status: DC
  Administered 2016-04-15 (×2): 3 [IU] via SUBCUTANEOUS
  Administered 2016-04-15: 2 [IU] via SUBCUTANEOUS
  Administered 2016-04-15: 5 [IU] via SUBCUTANEOUS

## 2016-04-14 MED ORDER — IOPAMIDOL (ISOVUE-370) INJECTION 76%
INTRAVENOUS | Status: AC
  Administered 2016-04-14: 100 mL
  Filled 2016-04-14: qty 100

## 2016-04-14 MED ORDER — MORPHINE SULFATE (PF) 4 MG/ML IV SOLN: 2.0000 mg | INTRAVENOUS | Status: DC | PRN

## 2016-04-14 MED ORDER — HYDROCODONE-ACETAMINOPHEN 5-325 MG PO TABS
1.0000 | ORAL_TABLET | ORAL | Status: DC | PRN
  Administered 2016-04-14 – 2016-04-16 (×4): 2 via ORAL
  Filled 2016-04-14 (×4): qty 2

## 2016-04-14 MED ORDER — HEPARIN (PORCINE) IN NACL 100-0.45 UNIT/ML-% IJ SOLN
1050.0000 [IU]/h | INTRAMUSCULAR | Status: DC
  Administered 2016-04-14: 900 [IU]/h via INTRAVENOUS
  Filled 2016-04-14: qty 250

## 2016-04-14 MED ORDER — ACETAMINOPHEN 325 MG PO TABS
650.0000 mg | ORAL_TABLET | ORAL | Status: DC | PRN
  Administered 2016-04-16: 02:00:00 650 mg via ORAL
  Filled 2016-04-14: qty 2

## 2016-04-14 MED ORDER — LOSARTAN POTASSIUM 50 MG PO TABS
25.0000 mg | ORAL_TABLET | Freq: Every day | ORAL | Status: DC
  Administered 2016-04-16: 10:00:00 25 mg via ORAL
  Filled 2016-04-14: qty 1

## 2016-04-14 MED ORDER — INSULIN GLARGINE 100 UNIT/ML ~~LOC~~ SOLN
45.0000 [IU] | Freq: Two times a day (BID) | SUBCUTANEOUS | Status: DC
  Administered 2016-04-14 – 2016-04-16 (×3): 45 [IU] via SUBCUTANEOUS
  Filled 2016-04-14 (×6): qty 0.45

## 2016-04-14 MED ORDER — MAGNESIUM 400 MG PO CAPS: 1.0000 | ORAL_CAPSULE | Freq: Two times a day (BID) | ORAL | Status: DC

## 2016-04-14 MED ORDER — NITROGLYCERIN 0.4 MG SL SUBL: 0.4000 mg | SUBLINGUAL_TABLET | SUBLINGUAL | Status: DC | PRN

## 2016-04-14 MED ORDER — ONDANSETRON HCL 4 MG/2ML IJ SOLN: 4.0000 mg | Freq: Four times a day (QID) | INTRAMUSCULAR | Status: DC | PRN

## 2016-04-14 MED ORDER — TIZANIDINE HCL 4 MG PO TABS
4.0000 mg | ORAL_TABLET | Freq: Four times a day (QID) | ORAL | Status: DC | PRN
  Administered 2016-04-14 – 2016-04-16 (×3): 4 mg via ORAL
  Filled 2016-04-14 (×5): qty 1

## 2016-04-14 MED ORDER — ALPRAZOLAM 0.25 MG PO TABS
0.2500 mg | ORAL_TABLET | Freq: Two times a day (BID) | ORAL | Status: DC | PRN
  Administered 2016-04-14: 0.25 mg via ORAL
  Filled 2016-04-14: qty 1

## 2016-04-14 NOTE — Progress Notes (Signed)
ANTICOAGULATION CONSULT NOTE - Initial Consult  Pharmacy Consult for heparin Indication: chest pain/ACS  Allergies  Allergen Reactions  . Aspirin     Large doses per patient  . Crestor [Rosuvastatin]     Leg crams  . Lipitor [Atorvastatin]     Leg crams  . Lyrica [Pregabalin] Other (See Comments)    Blurred vision  . Minocycline Hcl     REACTION: Reaction not known  . Oruvail [Ketoprofen]   . Tricor [Fenofibrate]   . Vasotec [Enalaprilat]   . Victoza [Liraglutide]   . Zinc     Patient Measurements: Height: 5\' 3"  (160 cm) Weight: 228 lb (103.4 kg) IBW/kg (Calculated) : 52.4 Heparin Dosing Weight: 77 kg  Vital Signs: Temp: 98.2 F (36.8 C) (12/06 1155) BP: 115/62 (12/06 1730) Pulse Rate: 54 (12/06 1730)  Labs:  Recent Labs  04/14/16 1251  HGB 12.7  HCT 39.1  PLT 259  CREATININE 1.21*    Estimated Creatinine Clearance: 46.2 mL/min (by C-G formula based on SCr of 1.21 mg/dL (H)).  Assessment: 75 yo f presenting with CP and + trop  PMH: anxiety, DM, HLD, HTN  Anticoag: none pta - iv hep for r/o ACS  CV: trop 2.47 - CT chest neg for PE   Nephro: SCr 1.21  Heme/Onc: H&H 12.7/39.7, Plt 259  Goal of Therapy:  Heparin level 0.3-0.7 units/ml Monitor platelets by anticoagulation protocol: Yes   Plan:  Heparin bolus 4000 units  Heparin infusion 900 units/hr Initial HL 0100 Daily HL, CBC LHC in AM  Levester Fresh, PharmD, BCPS, BCCCP Clinical Pharmacist Clinical phone for 04/14/2016 x [phone] 04/14/2016 6:13 PM

## 2016-04-14 NOTE — ED Provider Notes (Addendum)
[facility] DEPT Provider Note   CSN: [id number] Arrival date & time: 04/14/16  1141     History   Chief Complaint Chief Complaint  Patient presents with  . Chest Pain    HPI Janet Choi is a 75 y.o. female.Complains of anterior chest pain for the past week. Point cyst center of her chest. Pain hasn't been improved by taking 5-6 Tums until today when Tums did not alleviate discomfort. Symptoms accompanied by shortness of breath worse with exertion and improved with rest. She also treated herself with one baby aspirin prior to coming here. EMS treated patient with 3 additional baby aspirin's and one sublingual nitroglycerin which alleviated discomfort entirely. She is presently asymptomatic. No other associated symptoms. denies cough. Denies fever. No other associated symptoms  HPI  Past Medical History:  Diagnosis Date  . Anxiety   . Diabetes mellitus without complication ([place])   . DJD (degenerative joint disease)   . Hyperlipidemia   . Hypertension   . IBS (irritable bowel syndrome)   . Obesity   . OSA (obstructive sleep apnea)   . RLS (restless legs syndrome)   . Vitamin D deficiency     Patient Active Problem List   Diagnosis Date Noted  . Iron deficiency anemia 08/15/2015  . Hypoxia 08/06/2015  . OSA on CPAP 04/16/2015  . BMI 38.0-38.9,adult 04/14/2015  . Medicare annual wellness visit, subsequent 04/14/2015  . RLS (restless legs syndrome) 03/20/2014  . Poorly controlled type 2 diabetes mellitus with peripheral neuropathy ([place]) 03/20/2014  . Compliance poor 11/28/2013  . Medication management 09/12/2013  . Morbid obesity (BMI 39) 05/14/2013  . Vitamin D deficiency 05/14/2013  . 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 Hyperlipidemia 02/24/2009  . Essential hypertension 02/24/2009  . Diverticulosis of large intestine 02/24/2009  . History of colonic polyps 02/24/2009    Past Surgical History:  Procedure Laterality Date  . APPENDECTOMY    .  CATARACT EXTRACTION Bilateral   . LUMBAR DISC SURGERY     x 2  . ROTATOR CUFF REPAIR Bilateral   . TOTAL KNEE ARTHROPLASTY Bilateral     OB History    No data available       Home Medications    Prior to Admission medications   Medication Sig Start Date End Date Taking? Authorizing Provider  aspirin EC 81 MG tablet Take 81 mg by mouth daily.    Historical Provider, MD  BD PEN NEEDLE NANO U/F 32G X 4 MM MISC 2 (two) times daily. as directed 09/30/15   Historical Provider, MD  Cholecalciferol (VITAMIN D) 2000 UNITS tablet Take 10,000 Units by mouth daily.     Historical Provider, MD  diazepam (VALIUM) 5 MG tablet Take 1 tablet (5 mg total) by mouth at bedtime as needed for anxiety. Patient not taking: Reported on 03/16/2016 02/18/16   Courtney Forcucci, PA-C  FLECTOR 1.3 % PTCH APPLY 1 PATCH EVERY 12 HOURS AS NEEDED THEN TAKE OFF FOR 12 HOURS 10/03/15   Historical Provider, MD  FLUoxetine (PROZAC) 40 MG capsule TAKE 1 CAPSULE BY MOUTH DAILY. 01/07/16   [name], MD  furosemide (LASIX) 40 MG tablet TAKE 1 TABLET 3 TIMES A DAY BEFORE MEALS FOR EDEMA ( STOP TAKING HCTZ) 02/02/16   [name], MD  gabapentin (NEURONTIN) 600 MG tablet Take 1/2 to 1 tablet 3 x /daily for Pain. 08/15/15 02/14/16  [name], MD  Glucosamine-MSM-Hyaluronic Acd (JOINT HEALTH PO) Take by mouth daily.    Historical Provider,  MD  glucose blood (FREESTYLE LITE) test strip Check blood sugar 3 x / day as instructed Patient not taking: Reported on 03/16/2016 12/05/15 06/06/17  [name], MD  Insulin Syringe-Needle U-100 (BD INSULIN SYRINGE ULTRAFINE) 31G X 15/64" 1 ML MISC Use 2x a day 03/16/16   Philemon Kingdom, MD  Lactobacillus (PROBIOTIC ACIDOPHILUS PO) Take by mouth.    Historical Provider, MD  losartan (COZAAR) 50 MG tablet TAKE 1 TABLET BY MOUTH DAILY FOR BLOOD PRESSURE AND KIDNEY 04/28/15   [name], MD  Magnesium 400 MG CAPS Take 1 capsule by mouth 2 (two) times daily.     Historical  Provider, MD  metolazone (ZAROXOLYN) 2.5 MG tablet Take 1 tablet (2.5 mg total) by mouth daily. Patient not taking: Reported on 03/16/2016 02/11/16 02/10/17  Courtney Forcucci, PA-C  NOVOLIN 70/30 (70-30) 100 UNIT/ML injection INJECT 50 UNITS TWICE DAILY 04/11/16   [name], MD  OVER THE COUNTER MEDICATION 150 mg at bedtime. CVS acid reduced 75 mg at bedtime    Historical Provider, MD  [place] daily. Slow Released Iron    Historical Provider, MD  potassium chloride SA (K-DUR,KLOR-CON) 20 MEQ tablet Take 1 tablet (20 mEq total) by mouth 2 (two) times daily. Patient not taking: Reported on 03/16/2016 02/11/16   Loma Sousa Forcucci, PA-C  rizatriptan (MAXALT) 10 MG tablet Take 1 tablet (10 mg total) by mouth once as needed for migraine. May repeat in 2 hours if needed Patient not taking: Reported on 03/16/2016 11/14/15 11/13/16  [name], PA-C  rOPINIRole (REQUIP) 3 MG tablet Take 1 tablet 4 x / day for Restless Legs 10/21/15 04/21/16  [name], MD    Family History Family History  Problem Relation Age of Onset  . Heart disease Mother   . Kidney disease Father   . Colon cancer Neg Hx   . Colon polyps Neg Hx   . Esophageal cancer Neg Hx   . Pancreatic cancer Neg Hx   . Stomach cancer Neg Hx   . Liver disease Neg Hx   . Diabetes Neg Hx     Social History Social History  Substance Use Topics  . Smoking status: Former Smoker    Quit date: 05/11/1983  . Smokeless tobacco: Never Used  . Alcohol use No     Allergies   Aspirin; Crestor [rosuvastatin]; Lipitor [atorvastatin]; Lyrica [pregabalin]; Minocycline hcl; Oruvail [ketoprofen]; Tricor [fenofibrate]; Vasotec [enalaprilat]; Victoza [liraglutide]; and Zinc   Review of Systems Review of Systems  Constitutional: Negative.   HENT: Negative.   Respiratory: Positive for shortness of breath.   Cardiovascular: Positive for chest pain and leg swelling.       Swelling of legs worse at end of day    Gastrointestinal: Negative.   Musculoskeletal: Positive for back pain.       Chronic back pain  Skin: Negative.   Allergic/Immunologic: Positive for immunocompromised state.       Diabetic  Neurological: Negative.   Psychiatric/Behavioral: Negative.   All other systems reviewed and are negative.    Physical Exam Updated Vital Signs BP 132/62   Pulse 61   Temp 98.2 F (36.8 C)   Resp 22   Ht 5\' 3"  (1.6 m)   Wt 228 lb (103.4 kg)   SpO2 95%   BMI 40.39 kg/m   Physical Exam  Constitutional: She appears well-developed and well-nourished. No distress.  HENT:  Head: Normocephalic and atraumatic.  Eyes: Conjunctivae are normal. Pupils are equal, round, and reactive to  light.  Neck: Neck supple. No tracheal deviation present. No thyromegaly present.  Cardiovascular: Normal rate and regular rhythm.   No murmur heard. Pulmonary/Chest: Effort normal and breath sounds normal.  Abdominal: Soft. Bowel sounds are normal. She exhibits no distension. There is no tenderness.  Obese  Musculoskeletal: Normal range of motion. She exhibits no edema or tenderness.  Neurological: She is alert. Coordination normal.  Skin: Skin is warm and dry. No rash noted.  Psychiatric: She has a normal mood and affect.  Nursing note and vitals reviewed.    ED Treatments / Results  Labs (all labs ordered are listed, but only abnormal results are displayed) Labs Reviewed  BASIC METABOLIC PANEL - Abnormal; Notable for the following:       Result Value   Glucose, Bld 337 (*)    BUN 22 (*)    Creatinine, Ser 1.21 (*)    GFR calc non Af Amer 43 (*)    GFR calc Af Amer 49 (*)    All other components within normal limits  CBC  BRAIN NATRIURETIC PEPTIDE  D-DIMER, QUANTITATIVE (NOT AT [facility])  Randolm Idol, ED    EKG  EKG Interpretation  Date/Time:  Wednesday April 14 2016 11:55:25 EST Ventricular Rate:  63 PR Interval:    QRS Duration: 92 QT Interval:  376 QTC Calculation: 385 R  Axis:   -45 Text Interpretation:  Sinus rhythm Consider left atrial enlargement Inferior infarct, old Probable anterior infarct, age indeterminate No significant change since last tracing Confirmed by Winfred Leeds  MD, Keiri Solano [phone]) on 04/14/2016 11:59:49 AM Also confirmed by Winfred Leeds  MD, Oliver Neuwirth [phone]), editor Yehuda Mao [phone])  on 04/14/2016 12:22:44 PM      Results for orders placed or performed during the hospital encounter of [id number]  Basic metabolic panel  Result Value Ref Range   Sodium 138 135 - 145 mmol/L   Potassium 4.5 3.5 - 5.1 mmol/L   Chloride 104 101 - 111 mmol/L   CO2 25 22 - 32 mmol/L   Glucose, Bld 337 (H) 65 - 99 mg/dL   BUN 22 (H) 6 - 20 mg/dL   Creatinine, Ser 1.21 (H) 0.44 - 1.00 mg/dL   Calcium 9.5 8.9 - 10.3 mg/dL   GFR calc non Af Amer 43 (L) >60 mL/min   GFR calc Af Amer 49 (L) >60 mL/min   Anion gap 9 5 - 15  CBC  Result Value Ref Range   WBC 6.3 4.0 - 10.5 K/uL   RBC 4.44 3.87 - 5.11 MIL/uL   Hemoglobin 12.7 12.0 - 15.0 g/dL   HCT 39.1 36.0 - 46.0 %   MCV 88.1 78.0 - 100.0 fL   MCH 28.6 26.0 - 34.0 pg   MCHC 32.5 30.0 - 36.0 g/dL   RDW 12.6 11.5 - 15.5 %   Platelets 259 150 - 400 K/uL  Brain natriuretic peptide  Result Value Ref Range   B Natriuretic Peptide 488.6 (H) 0.0 - 100.0 pg/mL  D-dimer, quantitative (not at [facility])  Result Value Ref Range   D-Dimer, Quant 0.99 (H) 0.00 - 0.50 ug/mL-FEU  I-stat troponin, ED  Result Value Ref Range   Troponin i, poc 2.47 (HH) 0.00 - 0.08 ng/mL   Comment NOTIFIED PHYSICIAN    Comment 3           Dg Chest 2 View  Result Date: 04/14/2016 CLINICAL DATA:  Chest pain and indigestion for 3 weeks, pain worsened today EXAM: CHEST  2 VIEW COMPARISON:  CT  chest of 08/05/2015 and chest x-ray of the same day FINDINGS: There are bibasilar opacities present consistent with linear atelectasis or scarring. No pneumonia or effusion is seen. Mediastinal and hilar contours are unremarkable. Cardiomegaly is stable. There are  mild degenerative changes in the thoracic spine. IMPRESSION: Bibasilar linear atelectasis or scarring. Stable cardiomegaly. No pneumonia. Electronically Signed   By: Ivar Drape M.D.   On: 04/14/2016 13:26   Ct Angio Chest Pe W Or Wo Contrast  Result Date: 04/14/2016 CLINICAL DATA:  Elevated D-dimer level. Shortness of breath. Lumbar spinal injection yesterday. EXAM: CT ANGIOGRAPHY CHEST WITH CONTRAST TECHNIQUE: Multidetector CT imaging of the chest was performed using the standard protocol during bolus administration of intravenous contrast. Multiplanar CT image reconstructions and MIPs were obtained to evaluate the vascular anatomy. CONTRAST:  100 cc Isovue 370 COMPARISON:  Multiple exams, including 08/05/2015 FINDINGS: Despite efforts by the technologist and patient, motion artifact is present on today's exam and could not be eliminated. This reduces exam sensitivity and specificity. Body habitus reduces diagnostic sensitivity and specificity. Cardiovascular: No filling defect is identified in the pulmonary arterial tree to suggest pulmonary embolus. Reduction in sensitivity for small emboli due to motion and body habitus. Atherosclerotic calcification of the aortic arch and branch vessels. No acute thoracic aortic findings. Mild cardiomegaly primarily of the left heart. Mediastinum/Nodes: Unremarkable Lungs/Pleura: Mild atelectasis along both hemidiaphragms. Upper Abdomen: Unremarkable Musculoskeletal: Suspected fragmented spur along the right posterior glenoid. Likely chronic given appearance on prior exams. Thoracic spondylosis and degenerative disc disease. Review of the MIP images confirms the above findings. IMPRESSION: 1. No filling defect is identified in the pulmonary arterial tree to suggest pulmonary embolus. Sensitivity for small emboli is reduced due to body habitus and breathing motion artifact. 2. No acute aortic findings or other specific cause for the patient's chest pain. 3. Mild  cardiomegaly. 4. Mild atelectasis along both hemidiaphragms. 5. Thoracic spondylosis and degenerative disc disease. Electronically Signed   By: Van Clines M.D.   On: 04/14/2016 15:43   Mr Lumbar Spine Wo Contrast  Result Date: 03/30/2016 CLINICAL DATA:  Severe low back pain for 12 days. Two prior lumbar spine surgeries. EXAM: MRI LUMBAR SPINE WITHOUT CONTRAST TECHNIQUE: Multiplanar, multisequence MR imaging of the lumbar spine was performed. No intravenous contrast was administered. COMPARISON:  07/15/2015 FINDINGS: Segmentation:  Standard. Alignment: Mild lumbar levoscoliosis. Unchanged 4 mm anterolisthesis of L3 on L4. Vertebrae: No evidence of fracture or destructive osseous lesion. Moderate type 1 degenerative endplate changes at [id number] have mildly increased. The lumbar spinal canal is mildly small in caliber diffusely on a congenital basis due to short pedicles. Conus medullaris: Extends to the L1-2 level and appears normal. Paraspinal and other soft tissues: 1.8 cm T2 hyperintense left renal lesion, likely a cyst. Postsurgical changes in the posterior paraspinal soft tissues with asymmetric left lower lumbar paraspinal muscle atrophy. Disc levels: Disc desiccation throughout the lumbar spine. Similar appearance of multilevel disc space narrowing, mild at L1-2, mild-to-moderate at L2-3, and severe at L3-4 and L4-5. T10-11: Only imaged sagittally. Mild disc bulging without evidence of compressive stenosis, unchanged. T11-12: Only imaged sagittally. Mild disc bulging without evidence of compressive stenosis, unchanged. T12-L1: Mild disc bulging and mild facet arthrosis result in borderline spinal and borderline left neural foraminal stenosis, unchanged. L1-2: Mild disc bulging and mild facet arthrosis. Small right subarticular disc protrusion versus artifact. No significant stenosis identified. L2-3: Disc bulging asymmetric to the left, ligamentum flavum thickening, and moderate right and mild left  facet hypertrophy result in mild-to-moderate spinal stenosis and mild bilateral neural foraminal stenosis, unchanged. Small right and trace left facet joint effusions. L3-4: Prior right laminectomy. Listhesis with bulging uncovered disc, endplate spurring, left ligamentum flavum thickening, and advanced facet arthrosis result in moderate to severe spinal stenosis and moderate to severe right and moderate left neural foraminal stenosis, unchanged. L4-5: Prior left laminotomy. Disc bulging, left paracentral disc protrusion, endplate spurring, and mild facet arthrosis result in mild-to-moderate spinal stenosis, mild right and moderate left lateral recess stenosis, and moderate right and moderate to severe left neural foraminal stenosis, unchanged. L5-S1: Disc bulging, ligamentum flavum thickening, and advanced facet arthrosis result in mild spinal stenosis, left greater than right lateral recess stenosis, and moderate right and severe left neural foraminal stenosis, unchanged. Moderate right and small left facet joint effusions. IMPRESSION: 1. Increased degenerative endplate edema at [id number]. 2. Otherwise unchanged lumbar disc and facet degeneration as detailed above. Moderate to severe spinal stenosis at L3-4. Electronically Signed   By: Logan Bores M.D.   On: 03/30/2016 08:38   Radiology Dg Chest 2 View  Result Date: 04/14/2016 CLINICAL DATA:  Chest pain and indigestion for 3 weeks, pain worsened today EXAM: CHEST  2 VIEW COMPARISON:  CT chest of 08/05/2015 and chest x-ray of the same day FINDINGS: There are bibasilar opacities present consistent with linear atelectasis or scarring. No pneumonia or effusion is seen. Mediastinal and hilar contours are unremarkable. Cardiomegaly is stable. There are mild degenerative changes in the thoracic spine. IMPRESSION: Bibasilar linear atelectasis or scarring. Stable cardiomegaly. No pneumonia. Electronically Signed   By: Ivar Drape M.D.   On: 04/14/2016 13:26     Procedures Procedures (including critical care time)  Medications Ordered in ED Medications - No data to display   Initial Impression / Assessment and Plan / ED Course  I have reviewed the triage vital signs and the nursing notes.  Pertinent labs & imaging results that were available during my care of the patient were reviewed by me and considered in my medical decision making (see chart for details).  Clinical Course     4:30 PM patient asymptomatic except for chronic back pain. Cardiology service consulted who will evaluate patient in ED for admission. Symptoms and troponin consistent with  NSTEMI Cardiology evaluated patient in the ED and is requesting patient to be admitted to internal medicine service.. Dr. Loma Newton insulted and arranged for admission and inpatient stay to telemetry floor. Final Clinical Impressions(s) / ED Diagnoses  Diagnosis #1NSTEMI #2 hyperglycemia Final diagnoses:  None    New Prescriptions New Prescriptions   No medications on file     Orlie Dakin, MD 04/14/16 1647    Orlie Dakin, MD 04/14/16 [place], MD 04/14/16 [id number]

## 2016-04-14 NOTE — H&P (Signed)
History and Physical    Janet Choi [id number] DOB: August 08, 1940 DOA: 04/14/2016  PCP: Alesia Richards, MD   Patient coming from: Home  Chief Complaint: Chest pain  HPI: Janet Choi is a 75 y.o. female with medical history significant for obesity, OSA, hypertension, insulin-dependent diabetes mellitus, and chronic back pain who presents to the emergency department with 1-2 weeks of intermittent chest pain that became much worse today. Patient reports that she had been suffering from acute exacerbation of her chronic back pain for the past several weeks and has been receiving steroid injections for this. She had otherwise been in her usual state until the development of substernal chest discomfort 1-2 weeks ago. The patient initially suspected this to be indigestion and had been taking Tums. She denies any history of indigestion, but notes that the pain would resolve shortly after taking Tums. She was unable to identify any other alleviating or exacerbating factor. She had never experienced similar symptoms previously. The pain was described as a dull ache without radiation, and without associated nausea, diaphoresis, or dyspnea.  today, the pain was more severe, longer lasting, and there was no response to Tums. She called 911 and was treated with 324 mg of aspirin prior to arrival. She was given 1 dose of nitroglycerin en route to the hospital and her pain resolved with this.   ED Course: Upon arrival to the ED, patient is found to be afebrile, saturating adequately on room air, and with vitals otherwise stable. EKG features a sinus rhythm with no significant change from prior and chest x-ray is notable only for bibasilar atelectasis and stable cardiomegaly. Chemistry panel features a serum creatinine 1.21 which appears consistent with her baseline. Serum glucose is elevated to 337. CBC is unremarkable, troponin is elevated to a value of 2.47, and BNP is elevated to 489. D-dimer was  obtained and returns elevated to 0.99 and CTAP study is negative for acute PE or aortic findings. Cardiology was consulted by the ED physician, evaluated the patient in the emergency department, and has tentative plans for catheterization tomorrow. Cardiology has requested a medical admission given the patient's uncontrolled diabetes. She has remained free of chest pain in the ED, hemodynamically stable, and not in any apparent respiratory distress. She'll be admitted to the telemetry unit for ongoing evaluation and management of NSTEMI.  Review of Systems:  All other systems reviewed and apart from HPI, are negative.  Past Medical History:  Diagnosis Date  . Anxiety   . Diabetes mellitus without complication ([place])   . DJD (degenerative joint disease)   . Hyperlipidemia   . Hypertension   . IBS (irritable bowel syndrome)   . Obesity   . OSA (obstructive sleep apnea)   . RLS (restless legs syndrome)   . Vitamin D deficiency     Past Surgical History:  Procedure Laterality Date  . APPENDECTOMY    . CATARACT EXTRACTION Bilateral   . LUMBAR DISC SURGERY     x 2  . ROTATOR CUFF REPAIR Bilateral   . TOTAL KNEE ARTHROPLASTY Bilateral      reports that she quit smoking about 32 years ago. She has never used smokeless tobacco. She reports that she does not drink alcohol or use drugs.  Allergies  Allergen Reactions  . Aspirin     Large doses per patient  . Crestor [Rosuvastatin]     Leg crams  . Lipitor [Atorvastatin]     Leg crams  . Lyrica [Pregabalin] Other (See Comments)  Blurred vision  . Minocycline Hcl     REACTION: Reaction not known  . Oruvail [Ketoprofen]   . Tricor [Fenofibrate]   . Vasotec [Enalaprilat]   . Victoza [Liraglutide]   . Zinc     Family History  Problem Relation Age of Onset  . Heart disease Mother   . Kidney disease Father   . Colon cancer Neg Hx   . Colon polyps Neg Hx   . Esophageal cancer Neg Hx   . Pancreatic cancer Neg Hx   . Stomach  cancer Neg Hx   . Liver disease Neg Hx   . Diabetes Neg Hx      Prior to Admission medications   Medication Sig Start Date End Date Taking? Authorizing Provider  aspirin EC 81 MG tablet Take 81 mg by mouth daily.   Yes Historical Provider, MD  BD PEN NEEDLE NANO U/F 32G X 4 MM MISC 2 (two) times daily. as directed 09/30/15  Yes Historical Provider, MD  FLECTOR 1.3 % PTCH APPLY 1 PATCH EVERY 12 HOURS AS NEEDED THEN TAKE OFF FOR 12 HOURS 10/03/15  Yes Historical Provider, MD  FLUoxetine (PROZAC) 40 MG capsule TAKE 1 CAPSULE BY MOUTH DAILY. 01/07/16  Yes [name], MD  furosemide (LASIX) 40 MG tablet TAKE 1 TABLET 3 TIMES A DAY BEFORE MEALS FOR EDEMA ( STOP TAKING HCTZ) 02/02/16  Yes [name], MD  HYDROcodone-acetaminophen (NORCO/VICODIN) 5-325 MG tablet Take 1 tablet by mouth every 6 (six) hours as needed. 04/08/16  Yes Historical Provider, MD  insulin NPH-regular Human (NOVOLIN 70/30) (70-30) 100 UNIT/ML injection Inject 40-60 Units into the skin 2 (two) times daily. 40 units in the morning and 60 units at bedtime   Yes Historical Provider, MD  Insulin Syringe-Needle U-100 (BD INSULIN SYRINGE ULTRAFINE) 31G X 15/64" 1 ML MISC Use 2x a day 03/16/16  Yes Philemon Kingdom, MD  Lactobacillus (PROBIOTIC ACIDOPHILUS PO) Take by mouth.   Yes Historical Provider, MD  losartan (COZAAR) 50 MG tablet Take 25 mg by mouth daily.   Yes Historical Provider, MD  OVER THE COUNTER MEDICATION 150 mg at bedtime. CVS acid reduced 75 mg at bedtime   Yes Historical Provider, MD  rOPINIRole (REQUIP) 3 MG tablet Take 1 tablet 4 x / day for Restless Legs 10/21/15 04/21/16 Yes [name], MD  tiZANidine (ZANAFLEX) 4 MG tablet Take 4 mg by mouth every 6 (six) hours as needed. MUSCLE SPASM 04/06/16  Yes Historical Provider, MD  Cholecalciferol (VITAMIN D) 2000 UNITS tablet Take 10,000 Units by mouth daily.     Historical Provider, MD  diazepam (VALIUM) 5 MG tablet Take 1 tablet (5 mg total) by mouth at bedtime  as needed for anxiety. Patient not taking: Reported on 04/14/2016 02/18/16   Loma Sousa Forcucci, PA-C  gabapentin (NEURONTIN) 600 MG tablet Take 1/2 to 1 tablet 3 x /daily for Pain. Patient taking differently: Take 300 mg by mouth 2 (two) times daily. Take 1/2 to 1 tablet 3 x /daily for Pain. 08/15/15 02/14/16  [name], MD  Glucosamine-MSM-Hyaluronic Acd (JOINT HEALTH PO) Take by mouth daily.    Historical Provider, MD  glucose blood (FREESTYLE LITE) test strip Check blood sugar 3 x / day as instructed Patient not taking: Reported on 03/16/2016 12/05/15 06/06/17  [name], MD  losartan (COZAAR) 50 MG tablet TAKE 1 TABLET BY MOUTH DAILY FOR BLOOD PRESSURE AND KIDNEY Patient not taking: Reported on 04/14/2016 04/28/15   [name], MD  Magnesium 400 MG CAPS Take  1 capsule by mouth 2 (two) times daily.     Historical Provider, MD  metolazone (ZAROXOLYN) 2.5 MG tablet Take 1 tablet (2.5 mg total) by mouth daily. Patient not taking: Reported on 04/14/2016 02/11/16 02/10/17  Courtney Forcucci, PA-C  NOVOLIN 70/30 (70-30) 100 UNIT/ML injection INJECT 50 UNITS TWICE DAILY Patient not taking: Reported on 04/14/2016 04/11/16   [name], MD  OVER THE COUNTER MEDICATION daily. Slow Released Iron    Historical Provider, MD  potassium chloride SA (K-DUR,KLOR-CON) 20 MEQ tablet Take 1 tablet (20 mEq total) by mouth 2 (two) times daily. Patient not taking: Reported on 04/14/2016 02/11/16   [name], PA-C  rizatriptan (MAXALT) 10 MG tablet Take 1 tablet (10 mg total) by mouth once as needed for migraine. May repeat in 2 hours if needed Patient not taking: Reported on 04/14/2016 11/14/15 11/13/16  [name], PA-C    Physical Exam: Vitals:   04/14/16 1815 04/14/16 1918 04/14/16 1930 04/14/16 1945  BP: (!) 113/52 119/60 (!) 118/37 120/66  Pulse: (!) 50 (!) 50 (!) 51 (!) 53  Resp: 15  18 20   Temp:      SpO2: 96% 97% 98% 100%  Weight:      Height:          Constitutional: NAD,  calm, obese, appears uncomfortable Eyes: PERTLA, lids and conjunctivae normal ENMT: Mucous membranes are moist. Posterior pharynx clear of any exudate or lesions.   Neck: normal, supple, no masses, no thyromegaly Respiratory: clear to auscultation bilaterally, no wheezing, no crackles. Normal respiratory effort.    Cardiovascular: S1 & S2 heard, regular rate and rhythm. 1+ pretibial edema b/l. No significant JVD. Abdomen: No distension, no tenderness, no masses palpated. Bowel sounds normal.  Musculoskeletal: no clubbing / cyanosis. No joint deformity upper and lower extremities. Normal muscle tone.  Skin: no significant rashes, lesions, ulcers. Warm, dry, well-perfused. Neurologic: CN 2-12 grossly intact. Sensation intact, DTR normal. Strength 5/5 in all 4 limbs.  Psychiatric: Normal judgment and insight. Alert and oriented x 3. Normal mood and affect.     Labs on Admission: I have personally reviewed following labs and imaging studies  CBC:  Recent Labs Lab 04/14/16 1251  WBC 6.3  HGB 12.7  HCT 39.1  MCV 88.1  PLT [id number]   Basic Metabolic Panel:  Recent Labs Lab 04/14/16 1251  NA 138  K 4.5  CL 104  CO2 25  GLUCOSE 337*  BUN 22*  CREATININE 1.21*  CALCIUM 9.5   GFR: Estimated Creatinine Clearance: 46.2 mL/min (by C-G formula based on SCr of 1.21 mg/dL (H)). Liver Function Tests: No results for input(s): AST, ALT, ALKPHOS, BILITOT, PROT, ALBUMIN in the last 168 hours. No results for input(s): LIPASE, AMYLASE in the last 168 hours. No results for input(s): AMMONIA in the last 168 hours. Coagulation Profile: No results for input(s): INR, PROTIME in the last 168 hours. Cardiac Enzymes: No results for input(s): CKTOTAL, CKMB, CKMBINDEX, TROPONINI in the last 168 hours. BNP (last 3 results) No results for input(s): PROBNP in the last 8760 hours. HbA1C: No results for input(s): HGBA1C in the last 72 hours. CBG:  Recent Labs Lab 04/14/16 1958  GLUCAP 236*   Lipid  Profile: No results for input(s): CHOL, HDL, LDLCALC, TRIG, CHOLHDL, LDLDIRECT in the last 72 hours. Thyroid Function Tests: No results for input(s): TSH, T4TOTAL, FREET4, T3FREE, THYROIDAB in the last 72 hours. Anemia Panel: No results for input(s): VITAMINB12, FOLATE, FERRITIN, TIBC, IRON, RETICCTPCT in the last 72  hours. Urine analysis:    Component Value Date/Time   COLORURINE YELLOW 04/14/2015 1505   APPEARANCEUR CLEAR 04/14/2015 1505   LABSPEC 1.008 04/14/2015 1505   PHURINE 5.0 04/14/2015 1505   GLUCOSEU 2+ (A) 04/14/2015 1505   HGBUR NEGATIVE 04/14/2015 1505   BILIRUBINUR NEGATIVE 04/14/2015 1505   KETONESUR NEGATIVE 04/14/2015 1505   PROTEINUR NEGATIVE 04/14/2015 1505   UROBILINOGEN 0.2 08/27/2013 1427   NITRITE NEGATIVE 04/14/2015 1505   LEUKOCYTESUR NEGATIVE 04/14/2015 1505   Sepsis Labs: @LABRCNTIP (procalcitonin:4,lacticidven:4) )No results found for this or any previous visit (from the past 240 hour(s)).   Radiological Exams on Admission: Dg Chest 2 View  Result Date: 04/14/2016 CLINICAL DATA:  Chest pain and indigestion for 3 weeks, pain worsened today EXAM: CHEST  2 VIEW COMPARISON:  CT chest of 08/05/2015 and chest x-ray of the same day FINDINGS: There are bibasilar opacities present consistent with linear atelectasis or scarring. No pneumonia or effusion is seen. Mediastinal and hilar contours are unremarkable. Cardiomegaly is stable. There are mild degenerative changes in the thoracic spine. IMPRESSION: Bibasilar linear atelectasis or scarring. Stable cardiomegaly. No pneumonia. Electronically Signed   By: Ivar Drape M.D.   On: 04/14/2016 13:26   Ct Angio Chest Pe W Or Wo Contrast  Result Date: 04/14/2016 CLINICAL DATA:  Elevated D-dimer level. Shortness of breath. Lumbar spinal injection yesterday. EXAM: CT ANGIOGRAPHY CHEST WITH CONTRAST TECHNIQUE: Multidetector CT imaging of the chest was performed using the standard protocol during bolus administration of  intravenous contrast. Multiplanar CT image reconstructions and MIPs were obtained to evaluate the vascular anatomy. CONTRAST:  100 cc Isovue 370 COMPARISON:  Multiple exams, including 08/05/2015 FINDINGS: Despite efforts by the technologist and patient, motion artifact is present on today's exam and could not be eliminated. This reduces exam sensitivity and specificity. Body habitus reduces diagnostic sensitivity and specificity. Cardiovascular: No filling defect is identified in the pulmonary arterial tree to suggest pulmonary embolus. Reduction in sensitivity for small emboli due to motion and body habitus. Atherosclerotic calcification of the aortic arch and branch vessels. No acute thoracic aortic findings. Mild cardiomegaly primarily of the left heart. Mediastinum/Nodes: Unremarkable Lungs/Pleura: Mild atelectasis along both hemidiaphragms. Upper Abdomen: Unremarkable Musculoskeletal: Suspected fragmented spur along the right posterior glenoid. Likely chronic given appearance on prior exams. Thoracic spondylosis and degenerative disc disease. Review of the MIP images confirms the above findings. IMPRESSION: 1. No filling defect is identified in the pulmonary arterial tree to suggest pulmonary embolus. Sensitivity for small emboli is reduced due to body habitus and breathing motion artifact. 2. No acute aortic findings or other specific cause for the patient's chest pain. 3. Mild cardiomegaly. 4. Mild atelectasis along both hemidiaphragms. 5. Thoracic spondylosis and degenerative disc disease. Electronically Signed   By: Van Clines M.D.   On: 04/14/2016 15:43    EKG: Independently reviewed. Sinus rhythm, no significant change from prior.   Assessment/Plan  1. NSTEMI  - Presents with 1-2 wks intermittent CP, much worse today, denies any hx of CAD - She had never experienced similar sxs and initially attributed it to indigestion and had been taking Tums without releif - She reports relief of pain  with NTG x1 en route and pain has not recurred; 324 mg ASA given PTA  - Initial EKG is without acute ischemic features, but initial troponin is elevated to 2.47  - D-dimer was elevated to 0.90 and CTA negative for large PE, but sensitivity limited by body habitus and motion; further eval with BLE dopplers  was performed with prelim read of negative  - Cardiology is consulting and much appreciated, planning for cath tomorrow  - No beta-blocker d/t bradycardia; statin precluded by prior intolerance and pt unwillingness to try another  - Pt is started on heparin infusion; troponin will be trended and TTE is ordered   2. Insulin-dependent DM with hyperglycemia  - No A1c on file; serum glucose 337 on admission  - She was given Novolog 7 units on admission  - She is managed at home with Novolin 70/30, 40 units qAM and 60 units qPM; this will be held in the hospital  - Check CBG with meals and qHS, or q4h while NPO  - Start with Lantus 45 units BID and moderate-intensity SSI; adjust prn   3. Hypertension  - At goal currently  - Managed with losartan and Lasix at home - Continue losartan as tolerated; hold Lasix for now as she will be NPO   4. OSA  - Uses CPAP qHS, will continue    5. CKD stage III  - SCr is 1.21 on admission; appears consistent with her baseline  - Monitor     DVT prophylaxis: heparin infusion per ACS-dosing Code Status: Full  Family Communication: Discussed with patient Disposition Plan: Admit to telemetry Consults called: Cardiology Admission status: Inpatient    Vianne Bulls, MD [facility] Pager [phone]  If 7PM-7AM, please contact night-coverage [email or web address] Password [email or web address]  04/14/2016, 8:05 PM

## 2016-04-14 NOTE — ED Notes (Signed)
Pt back from xray. Pt states that she is not having any chest pain still, but lower back is a 10/10 pain.

## 2016-04-14 NOTE — ED Notes (Signed)
Pt ok to take home dose of pain med vicodin 5/325 and tizanadine 4mg . Per Dr. Lenna Sciara.

## 2016-04-14 NOTE — ED Notes (Signed)
Patient transported to Ultrasound 

## 2016-04-14 NOTE — Progress Notes (Signed)
Pt. Arrived to the unit via stretcher from ED in alert and stable condition. No s/s of distress noted. Pt. Does however c/o back pain/spasms at this time. Request meds be given at later time. Pt. Oriented to room and placed on telemetry. CCMD notified. Family at bedside with pt. RN informed by la that Pt. Has critical Troponin of 4.41. On call NP, K. Schorr, for [facility] paged. RN will continue to monitor pt. For changes in condition. Wayne Wicklund, Katherine Roan'

## 2016-04-14 NOTE — ED Triage Notes (Signed)
Pt presents from home via [facility] with c/o CP that has been on and off x1-2 weeks - had been helping with Tums, but today the pain did not improve with Tums, so she took 81mg  ASA at home again without relief.  EMS arrived and found O2Sat 91% RA - placed on 2L [place] and came up to 94%. EMS gave 3 81mg  ASA to total 324mg  today and 1 SL nitro.  Nitro relieved pain to 0/10/  Pt endorses DOE.  She states has not been taking Lasix for several days.

## 2016-04-14 NOTE — Progress Notes (Signed)
VASCULAR LAB PRELIMINARY  PRELIMINARY  PRELIMINARY  PRELIMINARY  Bilateral lower extremity venous duplex completed.    Preliminary report:  Bilateral:  No evidence of DVT, superficial thrombosis, or Baker's Cyst.   Min Tunnell, RVS 04/14/2016, 7:07 PM

## 2016-04-14 NOTE — ED Notes (Signed)
Report attempted 

## 2016-04-14 NOTE — Consult Note (Signed)
Patient ID: Janet Choi MRN: [id number], DOB/AGE: Feb 07, 1941   Admit date: 04/14/2016   Reason for Consult: Chest Pain and + troponin Requesting MD: Dr. Winfred Leeds     Primary Physician: Alesia Richards, MD Primary Cardiologist: Seen by Dr. Aundra Dubin in 2011  Pt. Profile:  75 y/o female with h/o anemia, diabetes, HTN, and hyperlipidemia presenting to the ED with chest pain with elevated d-dimer and troponin of 2.47.   Problem List  Past Medical History:  Diagnosis Date  . Anxiety   . Diabetes mellitus without complication ([place])   . DJD (degenerative joint disease)   . Hyperlipidemia   . Hypertension   . IBS (irritable bowel syndrome)   . Obesity   . OSA (obstructive sleep apnea)   . RLS (restless legs syndrome)   . Vitamin D deficiency     Past Surgical History:  Procedure Laterality Date  . APPENDECTOMY    . CATARACT EXTRACTION Bilateral   . LUMBAR DISC SURGERY     x 2  . ROTATOR CUFF REPAIR Bilateral   . TOTAL KNEE ARTHROPLASTY Bilateral      Allergies  Allergies  Allergen Reactions  . Aspirin     Large doses per patient  . Crestor [Rosuvastatin]     Leg crams  . Lipitor [Atorvastatin]     Leg crams  . Lyrica [Pregabalin] Other (See Comments)    Blurred vision  . Minocycline Hcl     REACTION: Reaction not known  . Oruvail [Ketoprofen]   . Tricor [Fenofibrate]   . Vasotec [Enalaprilat]   . Victoza [Liraglutide]   . Zinc     HPI  75 y/o female, followed in the past by Dr. Aundra Dubin. Last seen in clinic in 2011. She has a h/o anemia, diabetes, HTN, and hyperlipidemia. She had exertional dyspnea and chest pain in the setting of anemia. She has had extensive workup of her anemia by GI and hematology.  She is both B12 deficient and iron deficient.  Endoscopies (upper, lower, and capsule) have shown gastritis and diverticulosis. Dr. Aundra Dubin evaluated her with an echocardiogram, showing EF [id number] and mild diastolic dysfunction.  Myoview 06/2009 showed EF 69%,  no evidence for ischemia or infarction. She also has chronic low back pain and recently received a lumbar injection at [facility] for orthopedics yesterday. In her chronic severe low back pain she has been unable to sleep in bed. She sleeps in a recliner. He also has a family history of coronary disease. Her mother had a massive heart attack in her mid 5s. Her son also has coronary disease and has undergone stenting. He is 75 years old. He does not have a history of diabetes or tobacco use.  She presents now to the [facility] ED with complaint of anterior chest pain. To have been occurring off and on for the past month. Described as substernal/mid epigastric discomfort. She reports symptoms feel like heartburn. She has been taking Tums as needed over the last several weeks. Initially antacids helped her symptoms but over the last few days she has had no relief with them. Discomfort is nonradiating. It occurs at rest. It is not worsened by exertion nor positional changes. It is sometimes pleuritic.  On arrival to the ED, she was given aspirin and sublingual nitroglycerin which resolved her pain. D-dimer is 0.99. Troponin 2.47. Chest CT shows no filling defect is identified in the pulmonary arterial tree to suggest pulmonary embolus. Sensitivity for small emboli is reduced due to body habitus and  breathing motion artifact. No acute aortic findings or other specific cause for the patient's chest pain.  Mild cardiomegaly is noted. SCr is 1.21. BUN 22. Glucose 22. Hgb is 12.7. EKG shows NSR with no acute ST changes. CXR is negative for PNA. She is currently chest pain-free.   Home Medications  Prior to Admission medications   Medication Sig Start Date End Date Taking? Authorizing Provider  aspirin EC 81 MG tablet Take 81 mg by mouth daily.   Yes Historical Provider, MD  BD PEN NEEDLE NANO U/F 32G X 4 MM MISC 2 (two) times daily. as directed 09/30/15  Yes Historical Provider, MD  FLECTOR 1.3 % PTCH APPLY 1 PATCH EVERY 12  HOURS AS NEEDED THEN TAKE OFF FOR 12 HOURS 10/03/15  Yes Historical Provider, MD  FLUoxetine (PROZAC) 40 MG capsule TAKE 1 CAPSULE BY MOUTH DAILY. 01/07/16  Yes [name], MD  furosemide (LASIX) 40 MG tablet TAKE 1 TABLET 3 TIMES A DAY BEFORE MEALS FOR EDEMA ( STOP TAKING HCTZ) 02/02/16  Yes [name], MD  HYDROcodone-acetaminophen (NORCO/VICODIN) 5-325 MG tablet Take 1 tablet by mouth every 6 (six) hours as needed. 04/08/16  Yes Historical Provider, MD  insulin NPH-regular Human (NOVOLIN 70/30) (70-30) 100 UNIT/ML injection Inject 40-60 Units into the skin 2 (two) times daily. 40 units in the morning and 60 units at bedtime   Yes Historical Provider, MD  Insulin Syringe-Needle U-100 (BD INSULIN SYRINGE ULTRAFINE) 31G X 15/64" 1 ML MISC Use 2x a day 03/16/16  Yes Philemon Kingdom, MD  Lactobacillus (PROBIOTIC ACIDOPHILUS PO) Take by mouth.   Yes Historical Provider, MD  losartan (COZAAR) 50 MG tablet Take 25 mg by mouth daily.   Yes Historical Provider, MD  OVER THE COUNTER MEDICATION 150 mg at bedtime. CVS acid reduced 75 mg at bedtime   Yes Historical Provider, MD  rOPINIRole (REQUIP) 3 MG tablet Take 1 tablet 4 x / day for Restless Legs 10/21/15 04/21/16 Yes [name], MD  tiZANidine (ZANAFLEX) 4 MG tablet Take 4 mg by mouth every 6 (six) hours as needed. MUSCLE SPASM 04/06/16  Yes Historical Provider, MD  Cholecalciferol (VITAMIN D) 2000 UNITS tablet Take 10,000 Units by mouth daily.     Historical Provider, MD  diazepam (VALIUM) 5 MG tablet Take 1 tablet (5 mg total) by mouth at bedtime as needed for anxiety. Patient not taking: Reported on 04/14/2016 02/18/16   Loma Sousa Forcucci, PA-C  gabapentin (NEURONTIN) 600 MG tablet Take 1/2 to 1 tablet 3 x /daily for Pain. Patient taking differently: Take 300 mg by mouth 2 (two) times daily. Take 1/2 to 1 tablet 3 x /daily for Pain. 08/15/15 02/14/16  [name], MD  Glucosamine-MSM-Hyaluronic Acd (JOINT HEALTH PO) Take by mouth daily.     Historical Provider, MD  glucose blood (FREESTYLE LITE) test strip Check blood sugar 3 x / day as instructed Patient not taking: Reported on 03/16/2016 12/05/15 06/06/17  [name], MD  losartan (COZAAR) 50 MG tablet TAKE 1 TABLET BY MOUTH DAILY FOR BLOOD PRESSURE AND KIDNEY Patient not taking: Reported on 04/14/2016 04/28/15   [name], MD  Magnesium 400 MG CAPS Take 1 capsule by mouth 2 (two) times daily.     Historical Provider, MD  metolazone (ZAROXOLYN) 2.5 MG tablet Take 1 tablet (2.5 mg total) by mouth daily. Patient not taking: Reported on 04/14/2016 02/11/16 02/10/17  Courtney Forcucci, PA-C  NOVOLIN 70/30 (70-30) 100 UNIT/ML injection INJECT 50 UNITS TWICE DAILY Patient not taking: Reported on  04/14/2016 04/11/16   [name], MD  OVER THE COUNTER MEDICATION daily. Slow Released Iron    Historical Provider, MD  potassium chloride SA (K-DUR,KLOR-CON) 20 MEQ tablet Take 1 tablet (20 mEq total) by mouth 2 (two) times daily. Patient not taking: Reported on 04/14/2016 02/11/16   [name], PA-C  rizatriptan (MAXALT) 10 MG tablet Take 1 tablet (10 mg total) by mouth once as needed for migraine. May repeat in 2 hours if needed Patient not taking: Reported on 04/14/2016 11/14/15 11/13/16  [name], PA-C    Family History  Family History  Problem Relation Age of Onset  . Heart disease Mother   . Kidney disease Father   . Colon cancer Neg Hx   . Colon polyps Neg Hx   . Esophageal cancer Neg Hx   . Pancreatic cancer Neg Hx   . Stomach cancer Neg Hx   . Liver disease Neg Hx   . Diabetes Neg Hx     Social History  Social History   Social History  . Marital status: Single    Spouse name: N/A  . Number of children: N/A  . Years of education: N/A   Occupational History  . Not on file.   Social History Main Topics  . Smoking status: Former Smoker    Quit date: 05/11/1983  . Smokeless tobacco: Never Used  . Alcohol use No  . Drug use: No  . Sexual activity:  Not on file   Other Topics Concern  . Not on file   Social History Narrative  . No narrative on file     Review of Systems General:  No chills, fever, night sweats or weight changes.  Cardiovascular:  No chest pain, dyspnea on exertion, edema, orthopnea, palpitations, paroxysmal nocturnal dyspnea. Dermatological: No rash, lesions/masses Respiratory: No cough, dyspnea Urologic: No hematuria, dysuria Abdominal:   No nausea, vomiting, diarrhea, bright red blood per rectum, melena, or hematemesis Neurologic:  No visual changes, wkns, changes in mental status. All other systems reviewed and are otherwise negative except as noted above.  Physical Exam  Blood pressure 138/57, pulse (!) 56, temperature 98.2 F (36.8 C), resp. rate 16, height 5\' 3"  (1.6 m), weight 228 lb (103.4 kg), SpO2 100 %.  General: Pleasant, NAD, obese  Psych: Normal affect. Neuro: Alert and oriented X 3. Moves all extremities spontaneously. HEENT: Normal  Neck: Supple without bruits or JVD. Lungs:  Resp regular and unlabored, CTA. Heart: RRR no s3, s4, or murmurs. Abdomen: Soft, non-tender, non-distended, BS + x 4.  Extremities: No clubbing or cyanosis LEE slightly larger than the right DP/PT/Radials 2+ and equal bilaterally.  Labs  Troponin [facility] of Care Test)  Recent Labs  04/14/16 1331  TROPIPOC 2.47*   No results for input(s): CKTOTAL, CKMB, TROPONINI in the last 72 hours. Lab Results  Component Value Date   WBC 6.3 04/14/2016   HGB 12.7 04/14/2016   HCT 39.1 04/14/2016   MCV 88.1 04/14/2016   PLT 259 04/14/2016    Recent Labs Lab 04/14/16 1251  NA 138  K 4.5  CL 104  CO2 25  BUN 22*  CREATININE 1.21*  CALCIUM 9.5  GLUCOSE 337*   Lab Results  Component Value Date   CHOL 360 (H) 10/21/2015   HDL 38 (L) 10/21/2015   LDLCALC NOT CALC 10/21/2015   TRIG 658 (H) 10/21/2015   Lab Results  Component Value Date   DDIMER 0.99 (H) 04/14/2016     Radiology/Studies  Dg Chest  2  View  Result Date: 04/14/2016 CLINICAL DATA:  Chest pain and indigestion for 3 weeks, pain worsened today EXAM: CHEST  2 VIEW COMPARISON:  CT chest of 08/05/2015 and chest x-ray of the same day FINDINGS: There are bibasilar opacities present consistent with linear atelectasis or scarring. No pneumonia or effusion is seen. Mediastinal and hilar contours are unremarkable. Cardiomegaly is stable. There are mild degenerative changes in the thoracic spine. IMPRESSION: Bibasilar linear atelectasis or scarring. Stable cardiomegaly. No pneumonia. Electronically Signed   By: Ivar Drape M.D.   On: 04/14/2016 13:26   Ct Angio Chest Pe W Or Wo Contrast  Result Date: 04/14/2016 CLINICAL DATA:  Elevated D-dimer level. Shortness of breath. Lumbar spinal injection yesterday. EXAM: CT ANGIOGRAPHY CHEST WITH CONTRAST TECHNIQUE: Multidetector CT imaging of the chest was performed using the standard protocol during bolus administration of intravenous contrast. Multiplanar CT image reconstructions and MIPs were obtained to evaluate the vascular anatomy. CONTRAST:  100 cc Isovue 370 COMPARISON:  Multiple exams, including 08/05/2015 FINDINGS: Despite efforts by the technologist and patient, motion artifact is present on today's exam and could not be eliminated. This reduces exam sensitivity and specificity. Body habitus reduces diagnostic sensitivity and specificity. Cardiovascular: No filling defect is identified in the pulmonary arterial tree to suggest pulmonary embolus. Reduction in sensitivity for small emboli due to motion and body habitus. Atherosclerotic calcification of the aortic arch and branch vessels. No acute thoracic aortic findings. Mild cardiomegaly primarily of the left heart. Mediastinum/Nodes: Unremarkable Lungs/Pleura: Mild atelectasis along both hemidiaphragms. Upper Abdomen: Unremarkable Musculoskeletal: Suspected fragmented spur along the right posterior glenoid. Likely chronic given appearance on prior  exams. Thoracic spondylosis and degenerative disc disease. Review of the MIP images confirms the above findings. IMPRESSION: 1. No filling defect is identified in the pulmonary arterial tree to suggest pulmonary embolus. Sensitivity for small emboli is reduced due to body habitus and breathing motion artifact. 2. No acute aortic findings or other specific cause for the patient's chest pain. 3. Mild cardiomegaly. 4. Mild atelectasis along both hemidiaphragms. 5. Thoracic spondylosis and degenerative disc disease. Electronically Signed   By: Van Clines M.D.   On: 04/14/2016 15:43   Mr Lumbar Spine Wo Contrast  Result Date: 03/30/2016 CLINICAL DATA:  Severe low back pain for 12 days. Two prior lumbar spine surgeries. EXAM: MRI LUMBAR SPINE WITHOUT CONTRAST TECHNIQUE: Multiplanar, multisequence MR imaging of the lumbar spine was performed. No intravenous contrast was administered. COMPARISON:  07/15/2015 FINDINGS: Segmentation:  Standard. Alignment: Mild lumbar levoscoliosis. Unchanged 4 mm anterolisthesis of L3 on L4. Vertebrae: No evidence of fracture or destructive osseous lesion. Moderate type 1 degenerative endplate changes at [id number] have mildly increased. The lumbar spinal canal is mildly small in caliber diffusely on a congenital basis due to short pedicles. Conus medullaris: Extends to the L1-2 level and appears normal. Paraspinal and other soft tissues: 1.8 cm T2 hyperintense left renal lesion, likely a cyst. Postsurgical changes in the posterior paraspinal soft tissues with asymmetric left lower lumbar paraspinal muscle atrophy. Disc levels: Disc desiccation throughout the lumbar spine. Similar appearance of multilevel disc space narrowing, mild at L1-2, mild-to-moderate at L2-3, and severe at L3-4 and L4-5. T10-11: Only imaged sagittally. Mild disc bulging without evidence of compressive stenosis, unchanged. T11-12: Only imaged sagittally. Mild disc bulging without evidence of compressive stenosis,  unchanged. T12-L1: Mild disc bulging and mild facet arthrosis result in borderline spinal and borderline left neural foraminal stenosis, unchanged. L1-2: Mild disc bulging and mild facet arthrosis.  Small right subarticular disc protrusion versus artifact. No significant stenosis identified. L2-3: Disc bulging asymmetric to the left, ligamentum flavum thickening, and moderate right and mild left facet hypertrophy result in mild-to-moderate spinal stenosis and mild bilateral neural foraminal stenosis, unchanged. Small right and trace left facet joint effusions. L3-4: Prior right laminectomy. Listhesis with bulging uncovered disc, endplate spurring, left ligamentum flavum thickening, and advanced facet arthrosis result in moderate to severe spinal stenosis and moderate to severe right and moderate left neural foraminal stenosis, unchanged. L4-5: Prior left laminotomy. Disc bulging, left paracentral disc protrusion, endplate spurring, and mild facet arthrosis result in mild-to-moderate spinal stenosis, mild right and moderate left lateral recess stenosis, and moderate right and moderate to severe left neural foraminal stenosis, unchanged. L5-S1: Disc bulging, ligamentum flavum thickening, and advanced facet arthrosis result in mild spinal stenosis, left greater than right lateral recess stenosis, and moderate right and severe left neural foraminal stenosis, unchanged. Moderate right and small left facet joint effusions. IMPRESSION: 1. Increased degenerative endplate edema at [id number]. 2. Otherwise unchanged lumbar disc and facet degeneration as detailed above. Moderate to severe spinal stenosis at L3-4. Electronically Signed   By: Logan Bores M.D.   On: 03/30/2016 08:38    ECG  NSR. No ischemia    ASSESSMENT AND PLAN  1. Chest Pain with Elevated Troponin: moderate risk for cardiac etiology. She has multiple risk factors including poorly controlled IDDM with random glucose >300 in ED, HTN, HLD, remote tobacco use  and family h/o coronary disease. She has had symptoms of intermittent substernal/mid epigastric discomfort mimicking indigestion. She took multiple over-the-counter antacids earlier today without relief. Her pain was relieved in the ED after administration of aspirin and supplemental nitroglycerin. She is currently chest pain-free. EKG shows sinus rhythm without any ST abnormalities. D-dimer was abnormal however chest CT was negative for pulmonary embolus. However, sensitivity for small emboli is reduced due to body habitus and breathing motion artifact. No acute aortic findings or other specific cause for the patient's chest pain. POC troponin is abnormal at 2.49. SCr is 1.21. BUN 22. She is currently CP free. She will need to be admitted to telemetry for further monitoring. Will need to continue to cycle cardiac enzymes 3 to assess trend. There is no other clear explanation for her elevated troponin level of 2.4. Given her symptomatology as well as her risk factors, we'll plan for left heart catheterization tomorrow to rule out obstructive coronary artery disease. We will also need to obtain a 2-D echocardiogram to assess LV function and wall motion. Her BNP is 488. May also need to consider lower extremity venous Dopplers to rule out the possibility of DVT given left-sided lower extremity swelling and elevated d-dimer.   Signed, Lyda Jester, PA-C 04/14/2016, 4:45 PM

## 2016-04-14 NOTE — ED Notes (Signed)
Pt transported to CT ?

## 2016-04-15 ENCOUNTER — Inpatient Hospital Stay (HOSPITAL_COMMUNITY): Payer: PPO

## 2016-04-15 ENCOUNTER — Encounter (HOSPITAL_COMMUNITY)
Admission: EM | Disposition: A | Discharge status: Home / Self Care | Payer: Self-pay | Source: Home / Self Care | Attending: Internal Medicine

## 2016-04-15 DIAGNOSIS — R079 Chest pain, unspecified: Secondary | ICD-10-CM | POA: Diagnosis not present

## 2016-04-15 DIAGNOSIS — E1142 Type 2 diabetes mellitus with diabetic polyneuropathy: Secondary | ICD-10-CM | POA: Diagnosis not present

## 2016-04-15 DIAGNOSIS — D649 Anemia, unspecified: Secondary | ICD-10-CM | POA: Diagnosis not present

## 2016-04-15 DIAGNOSIS — E1122 Type 2 diabetes mellitus with diabetic chronic kidney disease: Secondary | ICD-10-CM | POA: Diagnosis not present

## 2016-04-15 DIAGNOSIS — E785 Hyperlipidemia, unspecified: Secondary | ICD-10-CM | POA: Diagnosis not present

## 2016-04-15 DIAGNOSIS — Z794 Long term (current) use of insulin: Secondary | ICD-10-CM | POA: Diagnosis not present

## 2016-04-15 DIAGNOSIS — Z955 Presence of coronary angioplasty implant and graft: Secondary | ICD-10-CM

## 2016-04-15 DIAGNOSIS — I2510 Atherosclerotic heart disease of native coronary artery without angina pectoris: Secondary | ICD-10-CM | POA: Diagnosis not present

## 2016-04-15 DIAGNOSIS — I1310 Hypertensive heart and chronic kidney disease without heart failure, with stage 1 through stage 4 chronic kidney disease, or unspecified chronic kidney disease: Secondary | ICD-10-CM | POA: Diagnosis not present

## 2016-04-15 DIAGNOSIS — E1165 Type 2 diabetes mellitus with hyperglycemia: Secondary | ICD-10-CM | POA: Diagnosis not present

## 2016-04-15 DIAGNOSIS — Z6841 Body Mass Index (BMI) 40.0 and over, adult: Secondary | ICD-10-CM | POA: Diagnosis not present

## 2016-04-15 DIAGNOSIS — I119 Hypertensive heart disease without heart failure: Secondary | ICD-10-CM

## 2016-04-15 DIAGNOSIS — I214 Non-ST elevation (NSTEMI) myocardial infarction: Secondary | ICD-10-CM | POA: Diagnosis not present

## 2016-04-15 DIAGNOSIS — Z9861 Coronary angioplasty status: Secondary | ICD-10-CM

## 2016-04-15 DIAGNOSIS — G8929 Other chronic pain: Secondary | ICD-10-CM | POA: Diagnosis not present

## 2016-04-15 DIAGNOSIS — N183 Chronic kidney disease, stage 3 (moderate): Secondary | ICD-10-CM | POA: Diagnosis not present

## 2016-04-15 DIAGNOSIS — G4733 Obstructive sleep apnea (adult) (pediatric): Secondary | ICD-10-CM | POA: Diagnosis not present

## 2016-04-15 HISTORY — PX: CARDIAC CATHETERIZATION: SHX172

## 2016-04-15 LAB — ECHOCARDIOGRAM COMPLETE
HEIGHTINCHES: 63 in
WEIGHTICAEL: 3688 [oz_av]

## 2016-04-15 LAB — POCT ACTIVATED CLOTTING TIME
ACTIVATED CLOTTING TIME: 252 s
ACTIVATED CLOTTING TIME: 279 s
ACTIVATED CLOTTING TIME: 290 s
Activated Clotting Time: 384 seconds
Activated Clotting Time: 257 seconds

## 2016-04-15 LAB — CBC
HCT: 41.2 % (ref 36.0–46.0)
HCT: 39.2 % (ref 36.0–46.0)
Hemoglobin: 13.3 g/dL (ref 12.0–15.0)
Hemoglobin: 12.6 g/dL (ref 12.0–15.0)
MCH: 28.7 pg (ref 26.0–34.0)
MCH: 28.7 pg (ref 26.0–34.0)
MCHC: 32.3 g/dL (ref 30.0–36.0)
MCHC: 32.1 g/dL (ref 30.0–36.0)
MCV: 89 fL (ref 78.0–100.0)
MCV: 89.3 fL (ref 78.0–100.0)
PLATELETS: 245 10*3/uL (ref 150–400)
PLATELETS: 283 10*3/uL (ref 150–400)
RBC: 4.63 MIL/uL (ref 3.87–5.11)
RBC: 4.39 MIL/uL (ref 3.87–5.11)
RDW: 12.9 % (ref 11.5–15.5)
RDW: 13.1 % (ref 11.5–15.5)
WBC: 7.6 10*3/uL (ref 4.0–10.5)
WBC: 7 10*3/uL (ref 4.0–10.5)

## 2016-04-15 LAB — BASIC METABOLIC PANEL
Anion gap: 10 (ref 5–15)
BUN: 27 mg/dL — AB (ref 6–20)
CHLORIDE: 101 mmol/L (ref 101–111)
CO2: 27 mmol/L (ref 22–32)
Calcium: 9.5 mg/dL (ref 8.9–10.3)
Creatinine, Ser: 1.18 mg/dL — ABNORMAL HIGH (ref 0.44–1.00)
GFR calc Af Amer: 51 mL/min — ABNORMAL LOW (ref >60)
GFR calc non Af Amer: 44 mL/min — ABNORMAL LOW (ref >60)
GLUCOSE: 201 mg/dL — AB (ref 65–99)
POTASSIUM: 4.5 mmol/L (ref 3.5–5.1)
Sodium: 138 mmol/L (ref 135–145)

## 2016-04-15 LAB — HEMOGLOBIN A1C
Hgb A1c MFr Bld: 10.3 % — ABNORMAL HIGH (ref 4.8–5.6)
MEAN PLASMA GLUCOSE: 249 mg/dL

## 2016-04-15 LAB — CREATININE, SERUM
Creatinine, Ser: 1.1 mg/dL — ABNORMAL HIGH (ref 0.44–1.00)
GFR calc non Af Amer: 48 mL/min — ABNORMAL LOW (ref >60)
GFR, EST AFRICAN AMERICAN: 55 mL/min — AB (ref >60)

## 2016-04-15 LAB — TROPONIN I
TROPONIN I: 4.87 ng/mL — AB (ref <0.03)
TROPONIN I: 5.32 ng/mL — AB (ref <0.03)
TROPONIN I: 5.81 ng/mL — AB (ref <0.03)

## 2016-04-15 LAB — GLUCOSE, CAPILLARY
GLUCOSE-CAPILLARY: 187 mg/dL — AB (ref 65–99)
GLUCOSE-CAPILLARY: 152 mg/dL — AB (ref 65–99)
GLUCOSE-CAPILLARY: 137 mg/dL — AB (ref 65–99)
GLUCOSE-CAPILLARY: 183 mg/dL — AB (ref 65–99)
Glucose-Capillary: 133 mg/dL — ABNORMAL HIGH (ref 65–99)
Glucose-Capillary: 194 mg/dL — ABNORMAL HIGH (ref 65–99)

## 2016-04-15 LAB — PROTIME-INR
INR: 1.06
Prothrombin Time: 13.8 seconds (ref 11.4–15.2)

## 2016-04-15 LAB — HEPARIN LEVEL (UNFRACTIONATED)
HEPARIN UNFRACTIONATED: 0.13 [IU]/mL — AB (ref 0.30–0.70)
Heparin Unfractionated: 0.25 IU/mL — ABNORMAL LOW (ref 0.30–0.70)

## 2016-04-15 SURGERY — LEFT HEART CATH AND CORONARY ANGIOGRAPHY
Anesthesia: LOCAL

## 2016-04-15 MED ORDER — TICAGRELOR 90 MG PO TABS
ORAL_TABLET | ORAL | Status: AC
  Filled 2016-04-15: qty 1

## 2016-04-15 MED ORDER — TIROFIBAN (AGGRASTAT) BOLUS VIA INFUSION
INTRAVENOUS | Status: DC | PRN
  Administered 2016-04-15: 2615 ug via INTRAVENOUS

## 2016-04-15 MED ORDER — SODIUM CHLORIDE 0.9% FLUSH: 3.0000 mL | INTRAVENOUS | Status: DC | PRN

## 2016-04-15 MED ORDER — ASPIRIN 81 MG PO CHEW: 81.0000 mg | CHEWABLE_TABLET | Freq: Every day | ORAL | Status: DC

## 2016-04-15 MED ORDER — VERAPAMIL HCL 2.5 MG/ML IV SOLN
INTRAVENOUS | Status: DC | PRN
  Administered 2016-04-15: 100 ug via INTRACORONARY

## 2016-04-15 MED ORDER — TIROFIBAN HCL IN NACL 5-0.9 MG/100ML-% IV SOLN
INTRAVENOUS | Status: DC | PRN
  Administered 2016-04-15: 0.15 ug/kg/min via INTRAVENOUS
  Administered 2016-04-15: 0.15 ug/kg/min

## 2016-04-15 MED ORDER — FENTANYL CITRATE (PF) 100 MCG/2ML IJ SOLN
INTRAMUSCULAR | Status: DC | PRN
  Administered 2016-04-15 (×2): 50 ug via INTRAVENOUS

## 2016-04-15 MED ORDER — HYDRALAZINE HCL 20 MG/ML IJ SOLN: 5.0000 mg | INTRAMUSCULAR | Status: AC | PRN

## 2016-04-15 MED ORDER — HEPARIN (PORCINE) IN NACL 2-0.9 UNIT/ML-% IJ SOLN
INTRAMUSCULAR | Status: DC | PRN
  Administered 2016-04-15: 1000 mL
  Administered 2016-04-15: 500 mL

## 2016-04-15 MED ORDER — MIDAZOLAM HCL 2 MG/2ML IJ SOLN
INTRAMUSCULAR | Status: AC
  Filled 2016-04-15: qty 2

## 2016-04-15 MED ORDER — HYDROMORPHONE HCL 1 MG/ML IJ SOLN
INTRAMUSCULAR | Status: DC | PRN
  Administered 2016-04-15: 0.5 mg via INTRAVENOUS

## 2016-04-15 MED ORDER — SODIUM CHLORIDE 0.9% FLUSH: 3.0000 mL | Freq: Two times a day (BID) | INTRAVENOUS | Status: DC

## 2016-04-15 MED ORDER — SODIUM CHLORIDE 0.9 % IV SOLN: INTRAVENOUS | Status: DC

## 2016-04-15 MED ORDER — SODIUM CHLORIDE 0.9 % IV SOLN: 250.0000 mL | INTRAVENOUS | Status: DC | PRN

## 2016-04-15 MED ORDER — SODIUM CHLORIDE 0.9 % IV SOLN
INTRAVENOUS | Status: DC
  Administered 2016-04-15: 08:00:00 via INTRAVENOUS

## 2016-04-15 MED ORDER — IOPAMIDOL (ISOVUE-370) INJECTION 76%
INTRAVENOUS | Status: AC
Start: 2016-04-15 — End: 2016-04-15
  Filled 2016-04-15: qty 125

## 2016-04-15 MED ORDER — ASPIRIN 81 MG PO CHEW: 81.0000 mg | CHEWABLE_TABLET | ORAL | Status: DC

## 2016-04-15 MED ORDER — LABETALOL HCL 5 MG/ML IV SOLN: 10.0000 mg | INTRAVENOUS | Status: AC | PRN

## 2016-04-15 MED ORDER — NITROGLYCERIN IN D5W 200-5 MCG/ML-% IV SOLN
INTRAVENOUS | Status: AC
  Filled 2016-04-15: qty 250

## 2016-04-15 MED ORDER — VERAPAMIL HCL 2.5 MG/ML IV SOLN
INTRAVENOUS | Status: AC
  Filled 2016-04-15: qty 2

## 2016-04-15 MED ORDER — TICAGRELOR 90 MG PO TABS
ORAL_TABLET | ORAL | Status: AC
  Filled 2016-04-15: qty 2

## 2016-04-15 MED ORDER — TIROFIBAN HCL IN NACL 5-0.9 MG/100ML-% IV SOLN
INTRAVENOUS | Status: AC
  Filled 2016-04-15: qty 100

## 2016-04-15 MED ORDER — HEART ATTACK BOUNCING BOOK
Freq: Once | Status: AC
  Administered 2016-04-15: 21:00:00
  Filled 2016-04-15: qty 1

## 2016-04-15 MED ORDER — SODIUM CHLORIDE 0.9 % WEIGHT BASED INFUSION: 1.0000 mL/kg/h | INTRAVENOUS | Status: AC

## 2016-04-15 MED ORDER — TIROFIBAN HCL IN NACL 5-0.9 MG/100ML-% IV SOLN
0.0750 ug/kg/min | INTRAVENOUS | Status: AC
Start: 2016-04-15 — End: 2016-04-16
  Administered 2016-04-15: 19:00:00 0.075 ug/kg/min via INTRAVENOUS
  Filled 2016-04-15 (×4): qty 100

## 2016-04-15 MED ORDER — INSULIN ASPART 100 UNIT/ML ~~LOC~~ SOLN: 0.0000 [IU] | Freq: Three times a day (TID) | SUBCUTANEOUS | Status: DC

## 2016-04-15 MED ORDER — HEPARIN SODIUM (PORCINE) 5000 UNIT/ML IJ SOLN
5000.0000 [IU] | Freq: Three times a day (TID) | INTRAMUSCULAR | Status: DC
  Administered 2016-04-16: 5000 [IU] via SUBCUTANEOUS
  Filled 2016-04-15: qty 1

## 2016-04-15 MED ORDER — MIDAZOLAM HCL 2 MG/2ML IJ SOLN
INTRAMUSCULAR | Status: DC | PRN
  Administered 2016-04-15 (×3): 1 mg via INTRAVENOUS

## 2016-04-15 MED ORDER — TICAGRELOR 90 MG PO TABS
90.0000 mg | ORAL_TABLET | Freq: Two times a day (BID) | ORAL | Status: DC
  Administered 2016-04-15 – 2016-04-16 (×2): 90 mg via ORAL
  Filled 2016-04-15 (×2): qty 1

## 2016-04-15 MED ORDER — ONDANSETRON HCL 4 MG/2ML IJ SOLN
INTRAMUSCULAR | Status: AC
Start: 2016-04-15 — End: 2016-04-15
  Filled 2016-04-15: qty 2

## 2016-04-15 MED ORDER — ANGIOPLASTY BOOK
Freq: Once | Status: AC
  Administered 2016-04-15: 21:00:00
  Filled 2016-04-15: qty 1

## 2016-04-15 MED ORDER — ATORVASTATIN CALCIUM 80 MG PO TABS: 80.0000 mg | ORAL_TABLET | Freq: Every day | ORAL | Status: DC

## 2016-04-15 MED ORDER — LIDOCAINE HCL (PF) 1 % IJ SOLN
INTRAMUSCULAR | Status: DC | PRN
  Administered 2016-04-15: 3 mL via SUBCUTANEOUS

## 2016-04-15 MED ORDER — HEPARIN SODIUM (PORCINE) 1000 UNIT/ML IJ SOLN
INTRAMUSCULAR | Status: DC | PRN
  Administered 2016-04-15: 2500 [IU] via INTRAVENOUS
  Administered 2016-04-15 (×2): 5000 [IU] via INTRAVENOUS
  Administered 2016-04-15: 3000 [IU] via INTRAVENOUS
  Administered 2016-04-15: 1500 [IU] via INTRAVENOUS

## 2016-04-15 MED ORDER — NITROGLYCERIN IN D5W 200-5 MCG/ML-% IV SOLN: 20.0000 ug/min | INTRAVENOUS | Status: DC

## 2016-04-15 MED ORDER — LIDOCAINE HCL (PF) 1 % IJ SOLN
INTRAMUSCULAR | Status: AC
  Filled 2016-04-15: qty 30

## 2016-04-15 MED ORDER — HYDROMORPHONE HCL 1 MG/ML IJ SOLN
0.5000 mg | INTRAMUSCULAR | Status: DC | PRN
  Administered 2016-04-15: 0.5 mg via INTRAVENOUS

## 2016-04-15 MED ORDER — VERAPAMIL HCL 2.5 MG/ML IV SOLN
INTRAVENOUS | Status: DC | PRN
  Administered 2016-04-15: 11:00:00 via INTRA_ARTERIAL

## 2016-04-15 MED ORDER — HEPARIN SODIUM (PORCINE) 1000 UNIT/ML IJ SOLN
INTRAMUSCULAR | Status: AC
Start: 2016-04-15 — End: 2016-04-15
  Filled 2016-04-15: qty 1

## 2016-04-15 MED ORDER — ACETAMINOPHEN 325 MG PO TABS: 650.0000 mg | ORAL_TABLET | ORAL | Status: DC | PRN

## 2016-04-15 MED ORDER — TICAGRELOR 90 MG PO TABS
ORAL_TABLET | ORAL | Status: DC | PRN
  Administered 2016-04-15: 180 mg via ORAL

## 2016-04-15 MED ORDER — HEPARIN (PORCINE) IN NACL 2-0.9 UNIT/ML-% IJ SOLN
INTRAMUSCULAR | Status: AC
  Filled 2016-04-15: qty 1000

## 2016-04-15 MED ORDER — INSULIN ASPART 100 UNIT/ML ~~LOC~~ SOLN: 0.0000 [IU] | Freq: Every day | SUBCUTANEOUS | Status: DC

## 2016-04-15 MED ORDER — IOPAMIDOL (ISOVUE-370) INJECTION 76%
INTRAVENOUS | Status: AC
  Filled 2016-04-15: qty 100

## 2016-04-15 MED ORDER — HYDROMORPHONE HCL 1 MG/ML IJ SOLN
INTRAMUSCULAR | Status: AC
  Filled 2016-04-15: qty 1

## 2016-04-15 MED ORDER — NITROGLYCERIN IN D5W 200-5 MCG/ML-% IV SOLN
INTRAVENOUS | Status: DC | PRN
  Administered 2016-04-15: 10 ug/min via INTRAVENOUS

## 2016-04-15 MED ORDER — FENTANYL CITRATE (PF) 100 MCG/2ML IJ SOLN
INTRAMUSCULAR | Status: AC
  Filled 2016-04-15: qty 2

## 2016-04-15 MED ORDER — HEPARIN SODIUM (PORCINE) 5000 UNIT/ML IJ SOLN: 5000.0000 [IU] | Freq: Three times a day (TID) | INTRAMUSCULAR | Status: DC

## 2016-04-15 MED ORDER — NITROGLYCERIN 1 MG/10 ML FOR IR/CATH LAB
INTRA_ARTERIAL | Status: DC | PRN
  Administered 2016-04-15: 200 ug via INTRACORONARY
  Administered 2016-04-15: 150 ug via INTRACORONARY

## 2016-04-15 MED ORDER — IOPAMIDOL (ISOVUE-370) INJECTION 76%
INTRAVENOUS | Status: AC
Start: 2016-04-15 — End: 2016-04-15
  Filled 2016-04-15: qty 100

## 2016-04-15 MED ORDER — ONDANSETRON HCL 4 MG/2ML IJ SOLN
4.0000 mg | Freq: Four times a day (QID) | INTRAMUSCULAR | Status: DC | PRN
  Administered 2016-04-15: 4 mg via INTRAVENOUS

## 2016-04-15 SURGICAL SUPPLY — 25 items
BALLN EUPHORA RX 2.5X15 (BALLOONS) ×2
BALLN EUPHORA RX 2.75X12 (BALLOONS) ×2
BALLN ~~LOC~~ EUPHORA RX 3.5X15 (BALLOONS) ×2
BALLOON EUPHORA RX 2.5X15 (BALLOONS) IMPLANT
BALLOON EUPHORA RX 2.75X12 (BALLOONS) IMPLANT
BALLOON ~~LOC~~ EUPHORA RX 3.5X15 (BALLOONS) IMPLANT
CATH EXPO 5F FL3.5 (CATHETERS) ×1 IMPLANT
CATH INFINITI JR4 5F (CATHETERS) ×1 IMPLANT
CATH VISTA GUIDE 6FR XB3 (CATHETERS) ×1 IMPLANT
DEVICE RAD COMP TR BAND LRG (VASCULAR PRODUCTS) ×1 IMPLANT
GLIDESHEATH SLEND A-KIT 6F 22G (SHEATH) ×1 IMPLANT
GUIDEWIRE INQWIRE 1.5J.035X260 (WIRE) IMPLANT
HOVERMATT SINGLE USE (MISCELLANEOUS) ×1 IMPLANT
INQWIRE 1.5J .035X260CM (WIRE) ×2
KIT ENCORE 26 ADVANTAGE (KITS) ×1 IMPLANT
KIT ESSENTIALS PG (KITS) ×1 IMPLANT
KIT HEART LEFT (KITS) ×2 IMPLANT
PACK CARDIAC CATHETERIZATION (CUSTOM PROCEDURE TRAY) ×2 IMPLANT
STENT RESOLUTE ONYX 3.0X22 (Permanent Stent) ×1 IMPLANT
STENT RESOLUTE ONYX 3.5X18 (Permanent Stent) ×1 IMPLANT
TRANSDUCER W/STOPCOCK (MISCELLANEOUS) ×2 IMPLANT
TUBING CIL FLEX 10 FLL-RA (TUBING) ×2 IMPLANT
WIRE ASAHI PROWATER 180CM (WIRE) ×1 IMPLANT
WIRE HI TORQ VERSACORE-J 145CM (WIRE) ×1 IMPLANT
WIRE MARVEL STR TIP 190CM (WIRE) ×1 IMPLANT

## 2016-04-15 NOTE — Progress Notes (Signed)
TR BAND REMOVAL  LOCATION:    Right radial   DEFLATED PER PROTOCOL:    Yes.    TIME BAND OFF / DRESSING APPLIED:    1755p  SITE UPON ARRIVAL:    Level 0  SITE AFTER BAND REMOVAL:    Level 1  CIRCULATION SENSATION AND MOVEMENT:    Within Normal Limits   Yes.    COMMENTS:   Noted brusing at site.  Pt able to move fingers  Freely.  Back pain at level 4 out of 10

## 2016-04-15 NOTE — H&P (View-Only) (Signed)
Patient ID: JUSTA ARCIERO MRN: [id number], DOB/AGE: [birth year]   Admit date: 04/14/2016   Reason for Consult: Chest Pain and + troponin Requesting MD: Dr. Winfred Leeds     Primary Physician: Alesia Richards, MD Primary Cardiologist: Seen by Dr. Aundra Dubin in 2011  Pt. Profile:  75 y/o female with h/o anemia, diabetes, HTN, and hyperlipidemia presenting to the ED with chest pain with elevated d-dimer and troponin of 2.47.   Problem List  Past Medical History:  Diagnosis Date  . Anxiety   . Diabetes mellitus without complication ([place])   . DJD (degenerative joint disease)   . Hyperlipidemia   . Hypertension   . IBS (irritable bowel syndrome)   . Obesity   . OSA (obstructive sleep apnea)   . RLS (restless legs syndrome)   . Vitamin D deficiency     Past Surgical History:  Procedure Laterality Date  . APPENDECTOMY    . CATARACT EXTRACTION Bilateral   . LUMBAR DISC SURGERY     x 2  . ROTATOR CUFF REPAIR Bilateral   . TOTAL KNEE ARTHROPLASTY Bilateral      Allergies  Allergies  Allergen Reactions  . Aspirin     Large doses per patient  . Crestor [Rosuvastatin]     Leg crams  . Lipitor [Atorvastatin]     Leg crams  . Lyrica [Pregabalin] Other (See Comments)    Blurred vision  . Minocycline Hcl     REACTION: Reaction not known  . Oruvail [Ketoprofen]   . Tricor [Fenofibrate]   . Vasotec [Enalaprilat]   . Victoza [Liraglutide]   . Zinc     HPI  75 y/o female, followed in the past by Dr. Aundra Dubin. Last seen in clinic in 2011. She has a h/o anemia, diabetes, HTN, and hyperlipidemia. She had exertional dyspnea and chest pain in the setting of anemia. She has had extensive workup of her anemia by GI and hematology.  She is both B12 deficient and iron deficient.  Endoscopies (upper, lower, and capsule) have shown gastritis and diverticulosis. Dr. Aundra Dubin evaluated her with an echocardiogram, showing EF [id number] and mild diastolic dysfunction.  Myoview 06/2009 showed EF 69%,  no evidence for ischemia or infarction. She also has chronic low back pain and recently received a lumbar injection at goal for orthopedics yesterday. In her chronic severe low back pain she has been unable to sleep in bed. She sleeps in a recliner. He also has a family history of coronary disease. Her mother had a massive heart attack in her mid 66s. Her son also has coronary disease and has undergone stenting. He is 75 years old. He does not have a history of diabetes or tobacco use.  She presents now to the [facility] ED with complaint of anterior chest pain. To have been occurring off and on for the past month. Described as substernal/mid epigastric discomfort. She reports symptoms feel like heartburn. She has been taking Tums as needed over the last several weeks. Initially antacids helped her symptoms but over the last few days she has had no relief with them. Discomfort is nonradiating. It occurs at rest. It is not worsened by exertion nor positional changes. It is sometimes pleuritic.  On arrival to the ED, she was given aspirin and sublingual nitroglycerin which resolved her pain. D-dimer is 0.99. Troponin 2.47. Chest CT shows no filling defect is identified in the pulmonary arterial tree to suggest pulmonary embolus. Sensitivity for small emboli is reduced due to body habitus and  breathing motion artifact. No acute aortic findings or other specific cause for the patient's chest pain.  Mild cardiomegaly is noted. SCr is 1.21. BUN 22. Glucose 22. Hgb is 12.7. EKG shows NSR with no acute ST changes. CXR is negative for PNA. She is currently chest pain-free.   Home Medications  Prior to Admission medications   Medication Sig Start Date End Date Taking? Authorizing Provider  aspirin EC 81 MG tablet Take 81 mg by mouth daily.   Yes Historical Provider, MD  BD PEN NEEDLE NANO U/F 32G X 4 MM MISC 2 (two) times daily. as directed 09/30/15  Yes Historical Provider, MD  FLECTOR 1.3 % PTCH APPLY 1 PATCH EVERY 12  HOURS AS NEEDED THEN TAKE OFF FOR 12 HOURS 10/03/15  Yes Historical Provider, MD  FLUoxetine (PROZAC) 40 MG capsule TAKE 1 CAPSULE BY MOUTH DAILY. 01/07/16  Yes [name], MD  furosemide (LASIX) 40 MG tablet TAKE 1 TABLET 3 TIMES A DAY BEFORE MEALS FOR EDEMA ( STOP TAKING HCTZ) 02/02/16  Yes [name], MD  HYDROcodone-acetaminophen (NORCO/VICODIN) 5-325 MG tablet Take 1 tablet by mouth every 6 (six) hours as needed. 04/08/16  Yes Historical Provider, MD  insulin NPH-regular Human (NOVOLIN 70/30) (70-30) 100 UNIT/ML injection Inject 40-60 Units into the skin 2 (two) times daily. 40 units in the morning and 60 units at bedtime   Yes Historical Provider, MD  Insulin Syringe-Needle U-100 (BD INSULIN SYRINGE ULTRAFINE) 31G X 15/64" 1 ML MISC Use 2x a day 03/16/16  Yes Philemon Kingdom, MD  Lactobacillus (PROBIOTIC ACIDOPHILUS PO) Take by mouth.   Yes Historical Provider, MD  losartan (COZAAR) 50 MG tablet Take 25 mg by mouth daily.   Yes Historical Provider, MD  OVER THE COUNTER MEDICATION 150 mg at bedtime. CVS acid reduced 75 mg at bedtime   Yes Historical Provider, MD  rOPINIRole (REQUIP) 3 MG tablet Take 1 tablet 4 x / day for Restless Legs 10/21/15 04/21/16 Yes [name], MD  tiZANidine (ZANAFLEX) 4 MG tablet Take 4 mg by mouth every 6 (six) hours as needed. MUSCLE SPASM 04/06/16  Yes Historical Provider, MD  Cholecalciferol (VITAMIN D) 2000 UNITS tablet Take 10,000 Units by mouth daily.     Historical Provider, MD  diazepam (VALIUM) 5 MG tablet Take 1 tablet (5 mg total) by mouth at bedtime as needed for anxiety. Patient not taking: Reported on 04/14/2016 02/18/16   Loma Sousa Forcucci, PA-C  gabapentin (NEURONTIN) 600 MG tablet Take 1/2 to 1 tablet 3 x /daily for Pain. Patient taking differently: Take 300 mg by mouth 2 (two) times daily. Take 1/2 to 1 tablet 3 x /daily for Pain. 08/15/15 02/14/16  [name], MD  Glucosamine-MSM-Hyaluronic Acd (JOINT HEALTH PO) Take by mouth daily.     Historical Provider, MD  glucose blood (FREESTYLE LITE) test strip Check blood sugar 3 x / day as instructed Patient not taking: Reported on 03/16/2016 12/05/15 06/06/17  [name], MD  losartan (COZAAR) 50 MG tablet TAKE 1 TABLET BY MOUTH DAILY FOR BLOOD PRESSURE AND KIDNEY Patient not taking: Reported on 04/14/2016 04/28/15   [name], MD  Magnesium 400 MG CAPS Take 1 capsule by mouth 2 (two) times daily.     Historical Provider, MD  metolazone (ZAROXOLYN) 2.5 MG tablet Take 1 tablet (2.5 mg total) by mouth daily. Patient not taking: Reported on 04/14/2016 02/11/16 02/10/17  Courtney Forcucci, PA-C  NOVOLIN 70/30 (70-30) 100 UNIT/ML injection INJECT 50 UNITS TWICE DAILY Patient not taking: Reported on  04/14/2016 04/11/16   [name], MD  OVER THE COUNTER MEDICATION daily. Slow Released Iron    Historical Provider, MD  potassium chloride SA (K-DUR,KLOR-CON) 20 MEQ tablet Take 1 tablet (20 mEq total) by mouth 2 (two) times daily. Patient not taking: Reported on 04/14/2016 02/11/16   [name], PA-C  rizatriptan (MAXALT) 10 MG tablet Take 1 tablet (10 mg total) by mouth once as needed for migraine. May repeat in 2 hours if needed Patient not taking: Reported on 04/14/2016 11/14/15 11/13/16  [name], PA-C    Family History  Family History  Problem Relation Age of Onset  . Heart disease Mother   . Kidney disease Father   . Colon cancer Neg Hx   . Colon polyps Neg Hx   . Esophageal cancer Neg Hx   . Pancreatic cancer Neg Hx   . Stomach cancer Neg Hx   . Liver disease Neg Hx   . Diabetes Neg Hx     Social History  Social History   Social History  . Marital status: Single    Spouse name: N/A  . Number of children: N/A  . Years of education: N/A   Occupational History  . Not on file.   Social History Main Topics  . Smoking status: Former Smoker    Quit date: 05/11/1983  . Smokeless tobacco: Never Used  . Alcohol use No  . Drug use: No  . Sexual activity:  Not on file   Other Topics Concern  . Not on file   Social History Narrative  . No narrative on file     Review of Systems General:  No chills, fever, night sweats or weight changes.  Cardiovascular:  No chest pain, dyspnea on exertion, edema, orthopnea, palpitations, paroxysmal nocturnal dyspnea. Dermatological: No rash, lesions/masses Respiratory: No cough, dyspnea Urologic: No hematuria, dysuria Abdominal:   No nausea, vomiting, diarrhea, bright red blood per rectum, melena, or hematemesis Neurologic:  No visual changes, wkns, changes in mental status. All other systems reviewed and are otherwise negative except as noted above.  Physical Exam  Blood pressure 138/57, pulse (!) 56, temperature 98.2 F (36.8 C), resp. rate 16, height 5\' 3"  (1.6 m), weight 228 lb (103.4 kg), SpO2 100 %.  General: Pleasant, NAD, obese  Psych: Normal affect. Neuro: Alert and oriented X 3. Moves all extremities spontaneously. HEENT: Normal  Neck: Supple without bruits or JVD. Lungs:  Resp regular and unlabored, CTA. Heart: RRR no s3, s4, or murmurs. Abdomen: Soft, non-tender, non-distended, BS + x 4.  Extremities: No clubbing or cyanosis LEE slightly larger than the right DP/PT/Radials 2+ and equal bilaterally.  Labs  Troponin [facility] Test)  Recent Labs  04/14/16 1331  TROPIPOC 2.47*   No results for input(s): CKTOTAL, CKMB, TROPONINI in the last 72 hours. Lab Results  Component Value Date   WBC 6.3 04/14/2016   HGB 12.7 04/14/2016   HCT 39.1 04/14/2016   MCV 88.1 04/14/2016   PLT 259 04/14/2016    Recent Labs Lab 04/14/16 1251  NA 138  K 4.5  CL 104  CO2 25  BUN 22*  CREATININE 1.21*  CALCIUM 9.5  GLUCOSE 337*   Lab Results  Component Value Date   CHOL 360 (H) 10/21/2015   HDL 38 (L) 10/21/2015   LDLCALC NOT CALC 10/21/2015   TRIG 658 (H) 10/21/2015   Lab Results  Component Value Date   DDIMER 0.99 (H) 04/14/2016     Radiology/Studies  Dg Chest  2  View  Result Date: 04/14/2016 CLINICAL DATA:  Chest pain and indigestion for 3 weeks, pain worsened today EXAM: CHEST  2 VIEW COMPARISON:  CT chest of 08/05/2015 and chest x-ray of the same day FINDINGS: There are bibasilar opacities present consistent with linear atelectasis or scarring. No pneumonia or effusion is seen. Mediastinal and hilar contours are unremarkable. Cardiomegaly is stable. There are mild degenerative changes in the thoracic spine. IMPRESSION: Bibasilar linear atelectasis or scarring. Stable cardiomegaly. No pneumonia. Electronically Signed   By: Ivar Drape M.D.   On: 04/14/2016 13:26   Ct Angio Chest Pe W Or Wo Contrast  Result Date: 04/14/2016 CLINICAL DATA:  Elevated D-dimer level. Shortness of breath. Lumbar spinal injection yesterday. EXAM: CT ANGIOGRAPHY CHEST WITH CONTRAST TECHNIQUE: Multidetector CT imaging of the chest was performed using the standard protocol during bolus administration of intravenous contrast. Multiplanar CT image reconstructions and MIPs were obtained to evaluate the vascular anatomy. CONTRAST:  100 cc Isovue 370 COMPARISON:  Multiple exams, including 08/05/2015 FINDINGS: Despite efforts by the technologist and patient, motion artifact is present on today's exam and could not be eliminated. This reduces exam sensitivity and specificity. Body habitus reduces diagnostic sensitivity and specificity. Cardiovascular: No filling defect is identified in the pulmonary arterial tree to suggest pulmonary embolus. Reduction in sensitivity for small emboli due to motion and body habitus. Atherosclerotic calcification of the aortic arch and branch vessels. No acute thoracic aortic findings. Mild cardiomegaly primarily of the left heart. Mediastinum/Nodes: Unremarkable Lungs/Pleura: Mild atelectasis along both hemidiaphragms. Upper Abdomen: Unremarkable Musculoskeletal: Suspected fragmented spur along the right posterior glenoid. Likely chronic given appearance on prior  exams. Thoracic spondylosis and degenerative disc disease. Review of the MIP images confirms the above findings. IMPRESSION: 1. No filling defect is identified in the pulmonary arterial tree to suggest pulmonary embolus. Sensitivity for small emboli is reduced due to body habitus and breathing motion artifact. 2. No acute aortic findings or other specific cause for the patient's chest pain. 3. Mild cardiomegaly. 4. Mild atelectasis along both hemidiaphragms. 5. Thoracic spondylosis and degenerative disc disease. Electronically Signed   By: Van Clines M.D.   On: 04/14/2016 15:43   Mr Lumbar Spine Wo Contrast  Result Date: 03/30/2016 CLINICAL DATA:  Severe low back pain for 12 days. Two prior lumbar spine surgeries. EXAM: MRI LUMBAR SPINE WITHOUT CONTRAST TECHNIQUE: Multiplanar, multisequence MR imaging of the lumbar spine was performed. No intravenous contrast was administered. COMPARISON:  07/15/2015 FINDINGS: Segmentation:  Standard. Alignment: Mild lumbar levoscoliosis. Unchanged 4 mm anterolisthesis of L3 on L4. Vertebrae: No evidence of fracture or destructive osseous lesion. Moderate type 1 degenerative endplate changes at [id number] have mildly increased. The lumbar spinal canal is mildly small in caliber diffusely on a congenital basis due to short pedicles. Conus medullaris: Extends to the L1-2 level and appears normal. Paraspinal and other soft tissues: 1.8 cm T2 hyperintense left renal lesion, likely a cyst. Postsurgical changes in the posterior paraspinal soft tissues with asymmetric left lower lumbar paraspinal muscle atrophy. Disc levels: Disc desiccation throughout the lumbar spine. Similar appearance of multilevel disc space narrowing, mild at L1-2, mild-to-moderate at L2-3, and severe at L3-4 and L4-5. T10-11: Only imaged sagittally. Mild disc bulging without evidence of compressive stenosis, unchanged. T11-12: Only imaged sagittally. Mild disc bulging without evidence of compressive stenosis,  unchanged. T12-L1: Mild disc bulging and mild facet arthrosis result in borderline spinal and borderline left neural foraminal stenosis, unchanged. L1-2: Mild disc bulging and mild facet arthrosis.  Small right subarticular disc protrusion versus artifact. No significant stenosis identified. L2-3: Disc bulging asymmetric to the left, ligamentum flavum thickening, and moderate right and mild left facet hypertrophy result in mild-to-moderate spinal stenosis and mild bilateral neural foraminal stenosis, unchanged. Small right and trace left facet joint effusions. L3-4: Prior right laminectomy. Listhesis with bulging uncovered disc, endplate spurring, left ligamentum flavum thickening, and advanced facet arthrosis result in moderate to severe spinal stenosis and moderate to severe right and moderate left neural foraminal stenosis, unchanged. L4-5: Prior left laminotomy. Disc bulging, left paracentral disc protrusion, endplate spurring, and mild facet arthrosis result in mild-to-moderate spinal stenosis, mild right and moderate left lateral recess stenosis, and moderate right and moderate to severe left neural foraminal stenosis, unchanged. L5-S1: Disc bulging, ligamentum flavum thickening, and advanced facet arthrosis result in mild spinal stenosis, left greater than right lateral recess stenosis, and moderate right and severe left neural foraminal stenosis, unchanged. Moderate right and small left facet joint effusions. IMPRESSION: 1. Increased degenerative endplate edema at [id number]. 2. Otherwise unchanged lumbar disc and facet degeneration as detailed above. Moderate to severe spinal stenosis at L3-4. Electronically Signed   By: Logan Bores M.D.   On: 03/30/2016 08:38    ECG  NSR. No ischemia    ASSESSMENT AND PLAN  1. Chest Pain with Elevated Troponin: moderate risk for cardiac etiology. She has multiple risk factors including poorly controlled IDDM with random glucose >300 in ED, HTN, HLD, remote tobacco use  and family h/o coronary disease. She has had symptoms of intermittent substernal/mid epigastric discomfort mimicking indigestion. She took multiple over-the-counter antacids earlier today without relief. Her pain was relieved in the ED after administration of aspirin and supplemental nitroglycerin. She is currently chest pain-free. EKG shows sinus rhythm without any ST abnormalities. D-dimer was abnormal however chest CT was negative for pulmonary embolus. However, sensitivity for small emboli is reduced due to body habitus and breathing motion artifact. No acute aortic findings or other specific cause for the patient's chest pain. POC troponin is abnormal at 2.49. SCr is 1.21. BUN 22. She is currently CP free. She will need to be admitted to telemetry for further monitoring. Will need to continue to cycle cardiac enzymes 3 to assess trend. There is no other clear explanation for her elevated troponin level of 2.4. Given her symptomatology as well as her risk factors, we'll plan for left heart catheterization tomorrow to rule out obstructive coronary artery disease. We will also need to obtain a 2-D echocardiogram to assess LV function and wall motion. Her BNP is 488. May also need to consider lower extremity venous Dopplers to rule out the possibility of DVT given left-sided lower extremity swelling and elevated d-dimer.   Signed, Lyda Jester, PA-C 04/14/2016, 4:45 PM

## 2016-04-15 NOTE — Progress Notes (Signed)
  Echocardiogram 2D Echocardiogram has been performed.  Janet Choi 04/15/2016, 9:28 AM

## 2016-04-15 NOTE — Interval H&P Note (Signed)
Cath Lab Visit (complete for each Cath Lab visit)  Clinical Evaluation Leading to the Procedure:   ACS: Yes.    Non-ACS:    Anginal Classification: CCS III  Anti-ischemic medical therapy: Minimal Therapy (1 class of medications)  Non-Invasive Test Results: No non-invasive testing performed  Prior CABG: No previous CABG      History and Physical Interval Note:  04/15/2016 10:36 AM  Janet Choi  has presented today for surgery, with the diagnosis of cp, nstemi  The various methods of treatment have been discussed with the patient and family. After consideration of risks, benefits and other options for treatment, the patient has consented to  Procedure(s): Left Heart Cath and Coronary Angiography (N/A) as a surgical intervention .  The patient's history has been reviewed, patient examined, no change in status, stable for surgery.  I have reviewed the patient's chart and labs.  Questions were answered to the patient's satisfaction.     Belva Crome III

## 2016-04-15 NOTE — Progress Notes (Signed)
Patient refused CPAP for the night  

## 2016-04-15 NOTE — Progress Notes (Signed)
Patient Name: Janet Choi Date of Encounter: 04/15/2016  Primary Cardiologist: new [facility])  Hospital Problem List     Principal Problem:   NSTEMI (non-ST elevated myocardial infarction) [facility]) Active Problems:   Hyperlipidemia   Essential hypertension   Poorly controlled type 2 diabetes mellitus with peripheral neuropathy (HCC)   OSA on CPAP   CKD (chronic kidney disease), stage III   Back pain, subacute     Subjective   Feeling well.  She notes mild chest pain after cath that is improving.    Inpatient Medications    Scheduled Meds: . aspirin  81 mg Oral Pre-Cath  . [MAR Hold] aspirin EC  81 mg Oral Daily  . [MAR Hold] FLUoxetine  40 mg Oral Daily  . [MAR Hold] gabapentin  300 mg Oral QHS  . [MAR Hold] insulin aspart  0-15 Units Subcutaneous Q4H  . [MAR Hold] insulin glargine  45 Units Subcutaneous BID  . [MAR Hold] losartan  25 mg Oral Daily  . sodium chloride flush  3 mL Intravenous Q12H   Continuous Infusions: . sodium chloride 10 mL/hr at 04/15/16 0803  . sodium chloride 1 mL/kg/hr (04/15/16 1342)  . heparin 1,050 Units/hr (04/15/16 0312)  . nitroGLYCERIN     PRN Meds: sodium chloride, [MAR Hold] acetaminophen, [MAR Hold] ALPRAZolam, hydrALAZINE, [MAR Hold] HYDROcodone-acetaminophen, HYDROmorphone (DILAUDID) injection, [MAR Hold]  morphine injection, [MAR Hold] nitroGLYCERIN, [MAR Hold] ondansetron (ZOFRAN) IV, ondansetron (ZOFRAN) IV, sodium chloride flush, [MAR Hold] tiZANidine   Vital Signs    Vitals:   04/15/16 1305 04/15/16 1315 04/15/16 1325 04/15/16 1340  BP: (!) 172/101 (!) 180/77 (!) 186/64 (!) 150/89  Pulse: 63 62 (!) 56 (!) 58  Resp: (!) 21 18 19 16   Temp:      TempSrc:      SpO2: 90% (!) 89% 95% 93%  Weight:      Height:        Intake/Output Summary (Last 24 hours) at 04/15/16 1558 Last data filed at 04/15/16 1350  Gross per 24 hour  Intake            273.3 ml  Output             1170 ml  Net           -896.7 ml   Filed Weights    04/14/16 1200 04/14/16 2100 04/15/16 0407  Weight: 103.4 kg (228 lb) 104.6 kg (230 lb 8 oz) 104.6 kg (230 lb 8 oz)    Physical Exam    GEN: Well nourished, well developed, in no acute distress.  HEENT: Grossly normal.  Neck: Supple, no JVD, carotid bruits, or masses. Cardiac: RRR, no murmurs, rubs, or gallops. No clubbing, cyanosis, edema.  Radials/DP/PT 2+ and equal bilaterally.  Respiratory:  Respirations regular and unlabored, clear to auscultation bilaterally. GI: Soft, nontender, nondistended, BS + x 4. MS: no deformity or atrophy. Skin: warm and dry, no rash. Neuro:  Strength and sensation are intact. Psych: AAOx3.  Normal affect. Extremities: R radial TR band in place.   Labs    CBC  Recent Labs  04/14/16 1251 04/15/16 0553  WBC 6.3 7.6  HGB 12.7 13.3  HCT 39.1 41.2  MCV 88.1 89.0  PLT 259 [id number]   Basic Metabolic Panel  Recent Labs  04/14/16 1251 04/15/16 0553  NA 138 138  K 4.5 4.5  CL 104 101  CO2 25 27  GLUCOSE 337* 201*  BUN 22* 27*  CREATININE 1.21* 1.18*  CALCIUM 9.5 9.5   Liver Function Tests No results for input(s): AST, ALT, ALKPHOS, BILITOT, PROT, ALBUMIN in the last 72 hours. No results for input(s): LIPASE, AMYLASE in the last 72 hours. Cardiac Enzymes  Recent Labs  04/14/16 1924 04/15/16 0113 04/15/16 0553  TROPONINI 4.41* 4.87* 5.32*   BNP Invalid input(s): POCBNP D-Dimer  Recent Labs  04/14/16 1256  DDIMER 0.99*   Hemoglobin A1C No results for input(s): HGBA1C in the last 72 hours. Fasting Lipid Panel No results for input(s): CHOL, HDL, LDLCALC, TRIG, CHOLHDL, LDLDIRECT in the last 72 hours. Thyroid Function Tests No results for input(s): TSH, T4TOTAL, T3FREE, THYROIDAB in the last 72 hours.  Invalid input(s): FREET3  Telemetry    Unable to review.  Patient in cath lab holding - Personally Reviewed  ECG    Sinus bradycardia.  Rate 58 bpm.  Prior anterolateral and inferior infarct.  Absent R wave progression.  -  Personally Reviewed  Radiology    Dg Chest 2 View  Result Date: 04/14/2016 CLINICAL DATA:  Chest pain and indigestion for 3 weeks, pain worsened today EXAM: CHEST  2 VIEW COMPARISON:  CT chest of 08/05/2015 and chest x-ray of the same day FINDINGS: There are bibasilar opacities present consistent with linear atelectasis or scarring. No pneumonia or effusion is seen. Mediastinal and hilar contours are unremarkable. Cardiomegaly is stable. There are mild degenerative changes in the thoracic spine. IMPRESSION: Bibasilar linear atelectasis or scarring. Stable cardiomegaly. No pneumonia. Electronically Signed   By: Ivar Drape M.D.   On: 04/14/2016 13:26   Ct Angio Chest Pe W Or Wo Contrast  Result Date: 04/14/2016 CLINICAL DATA:  Elevated D-dimer level. Shortness of breath. Lumbar spinal injection yesterday. EXAM: CT ANGIOGRAPHY CHEST WITH CONTRAST TECHNIQUE: Multidetector CT imaging of the chest was performed using the standard protocol during bolus administration of intravenous contrast. Multiplanar CT image reconstructions and MIPs were obtained to evaluate the vascular anatomy. CONTRAST:  100 cc Isovue 370 COMPARISON:  Multiple exams, including 08/05/2015 FINDINGS: Despite efforts by the technologist and patient, motion artifact is present on today's exam and could not be eliminated. This reduces exam sensitivity and specificity. Body habitus reduces diagnostic sensitivity and specificity. Cardiovascular: No filling defect is identified in the pulmonary arterial tree to suggest pulmonary embolus. Reduction in sensitivity for small emboli due to motion and body habitus. Atherosclerotic calcification of the aortic arch and branch vessels. No acute thoracic aortic findings. Mild cardiomegaly primarily of the left heart. Mediastinum/Nodes: Unremarkable Lungs/Pleura: Mild atelectasis along both hemidiaphragms. Upper Abdomen: Unremarkable Musculoskeletal: Suspected fragmented spur along the right posterior  glenoid. Likely chronic given appearance on prior exams. Thoracic spondylosis and degenerative disc disease. Review of the MIP images confirms the above findings. IMPRESSION: 1. No filling defect is identified in the pulmonary arterial tree to suggest pulmonary embolus. Sensitivity for small emboli is reduced due to body habitus and breathing motion artifact. 2. No acute aortic findings or other specific cause for the patient's chest pain. 3. Mild cardiomegaly. 4. Mild atelectasis along both hemidiaphragms. 5. Thoracic spondylosis and degenerative disc disease. Electronically Signed   By: Van Clines M.D.   On: 04/14/2016 15:43    Cardiac Studies   LHC 04/15/16: >90% stenosis of the mid LAD and mid LCx.  She underwent PCI of both vessels.  Full report pending.  Echo 04/15/16: Study Conclusions  - Left ventricle: The cavity size was normal. There was moderate   focal basal and mild concentric hypertrophy. Systolic function  was normal. The estimated ejection fraction was in the range of   50% to 55%. There is akinesis of the distal septal and apical   myocardium. There is akinesis of the apicalinferior myocardium.   There was an increased relative contribution of atrial   contraction to ventricular filling. Doppler parameters are   consistent with abnormal left ventricular relaxation (grade 1   diastolic dysfunction). Doppler parameters are consistent with   high ventricular filling pressure. - Aortic valve: Mildly to moderately calcified annulus. Mildly   calcified leaflets. - Mitral valve: Calcified annulus. Mildly calcified leaflets .   There was trivial regurgitation. - Atrial septum: No defect or patent foramen ovale was identified.  Patient Profile     Ms. Gomolka isa 58F with hypertension, hyperlipidemia, and diabetes who presented with NSTEMI.  She underwent PCI of the LAD and LCx.   Assessment & Plan    # NSTEMI: # Hyperlipidemia:  Ms. Bonis underwent successful PCI  of the LAD and LCx.  Further details pending.  She is currently resting comfortably in the cath lab holding area.  Continue aspirin and ticagrelor.  She will need lipid clinic follow up for PCSK9 inhibitor, as she has not tolerated multiple statins.  No beta blocker due to bradycardia.  # Hypertension: BP is poorly-controlled.  She did not receive losartan that AM prior to her cath.  We will monitor her BP after she gets her dose and titrate accordingly.  Signed, Skeet Latch, MD  04/15/2016, 3:58 PM

## 2016-04-15 NOTE — Progress Notes (Signed)
[place] for Aggrastat x 18 hrs post-PCI Indication: PCI/stent placement  Allergies  Allergen Reactions  . Aspirin     Large doses per patient  . Crestor [Rosuvastatin]     Leg crams  . Lipitor [Atorvastatin]     Leg crams  . Lyrica [Pregabalin] Other (See Comments)    Blurred vision  . Minocycline Hcl     REACTION: Reaction not known  . Oruvail [Ketoprofen]   . Tricor [Fenofibrate]   . Vasotec [Enalaprilat]   . Victoza [Liraglutide]   . Zinc     Patient Measurements: Height: 5\' 3"  (160 cm) Weight: 230 lb 8 oz (104.6 kg) IBW/kg (Calculated) : 52.4  Aggrastat Dosing Weight: 104.6 kg  Vital Signs: Temp: 97.9 F (36.6 C) (12/07 1916) Temp Source: Oral (12/07 1916) BP: 167/63 (12/07 2000) Pulse Rate: 75 (12/07 2000)  Labs:  Recent Labs  04/14/16 1251  04/15/16 0113 04/15/16 0553 04/15/16 0940 04/15/16 1901 04/15/16 2128  HGB 12.7  --   --  13.3  --   --  12.6  HCT 39.1  --   --  41.2  --   --  39.2  PLT 259  --   --  245  --   --  283  LABPROT  --   --   --  13.8  --   --   --   INR  --   --   --  1.06  --   --   --   HEPARINUNFRC  --   --  0.13*  --  0.25*  --   --   CREATININE 1.21*  --   --  1.18*  --  1.10*  --   TROPONINI  --   < > 4.87* 5.32*  --  5.81*  --   < > = values in this interval not displayed.  Estimated Creatinine Clearance: 51.1 mL/min (by C-G formula based on SCr of 1.1 mg/dL (H)).  Assessment:   Aggrastat initiated in Cath Lab ~12:20pm  with 25 mcg/kg bolus then 0.15 mcg/kg/min infusion.  Transferred to post-cath unit ~6:20 pm.  Drip decreased to 0.075 mcg/kg/min ~7pm, for crcl < 60 ml/min.   Aggrastat to continue x 18 hrs post-PCI.   Baseline Hgb 13.3, platelet count 245.   CBC is stable ~9 hrs after Aggrastat begun.  Goal of Therapy:  Platelet count >150K and not more than 50% drop from baseline Monitor platelets by anticoagulation protocol: Yes   Plan:   Continue Aggrastat at 0.075  mcg/kg/min for 18 hrs post-PCI, thru 12/8 at ~7am.  CBC in am.  Arty Baumgartner, [place] Pager: [phone] 04/15/2016,10:42 PM

## 2016-04-15 NOTE — Progress Notes (Signed)
1820 Patient received from holding area in cath lab via bed. Transferred to lounge chair. Steady gait with transfer from bed to chair. Patient stated she is much more comfortable sitting in chair. Denies need for pain medication at this time. Alert, oriented. TR band covered in coban. Coban removed per patient request, complained about it being too tight. Radial site. Level 0, no bleeding, bruising or hematoma noted and dressing dry and intact. 1830 Resting comfortably in lounge chair with no needs at this time per patient. Friends into visit.

## 2016-04-15 NOTE — Progress Notes (Signed)
ANTICOAGULATION CONSULT NOTE - Follow Up Consult  Pharmacy Consult for Heparin  Indication: chest pain/ACS  Patient Measurements: Height: 5\' 3"  (160 cm) Weight: 230 lb 8 oz (104.6 kg) IBW/kg (Calculated) : 52.4  Vital Signs: Temp: 97.6 F (36.4 C) (12/06 2100) Temp Source: Oral (12/06 2100) BP: 146/72 (12/06 2100) Pulse Rate: 53 (12/06 2100)  Labs:  Recent Labs  04/14/16 1251 04/14/16 1924 04/15/16 0113  HGB 12.7  --   --   HCT 39.1  --   --   PLT 259  --   --   HEPARINUNFRC  --   --  0.13*  CREATININE 1.21*  --   --   TROPONINI  --  4.41*  --     Estimated Creatinine Clearance: 46.5 mL/min (by C-G formula based on SCr of 1.21 mg/dL (H)).  Assessment: Heparin for CP/elevated troponin, cath today, initial heparin level is low, no issues per RN.  Goal of Therapy:  Heparin level 0.3-0.7 units/ml Monitor platelets by anticoagulation protocol: Yes   Plan:  -Inc heparin to 1050 units/hr -1000 HL  Zacharia Sowles 04/15/2016,2:11 AM

## 2016-04-15 NOTE — Progress Notes (Signed)
PROGRESS NOTE    Janet Choi  [id number] DOB: November 13, 1940 DOA: 04/14/2016 PCP: Alesia Richards, MD     Brief Narrative:  Janet Choi is a 75 y.o. female with medical history significant for obesity, OSA, hypertension, insulin-dependent diabetes mellitus, and chronic back pain who presents to the emergency department with 1-2 weeks of intermittent chest pain that became much worse. She thought it was heart burn and took tums without relief. She describes chest pain as a central pain that is dull and heavy in nature. On presentation to the ED, troponin was elevated and patient seen by cardiology. Heart cath 12/7.   Assessment & Plan:   Principal Problem:   NSTEMI (non-ST elevated myocardial infarction) ([place]) Active Problems:   Hyperlipidemia   Essential hypertension   Poorly controlled type 2 diabetes mellitus with peripheral neuropathy (HCC)   OSA on CPAP   CKD (chronic kidney disease), stage III   Back pain, subacute  NSTEMI -Cardiology following -Heart cath today 12/7  -CP free this morning   Insulin-dependent DM  -Ha1c pending  -Takes novolin 70/30 at home, will hold and start lantus 45u BID, SSI   Chronic back pain -Rcv steroid injections as outpatient  Essential HTN -Continue losartan  OSA -CPAP qhs  CKD stage 3 -Cr 1.2 on admission, which is baseline -Trend    DVT prophylaxis: heparin Code Status: Full Family Communication: no family at bedside Disposition Plan: pending further improvement    Consultants:   Cardiology  Procedures:   Heart cath 12/7  Antimicrobials:   None    Subjective: Patient quite tearful this morning, complaining of back pain. She gets steroid injections intermittently. Currently denies any chest pain or shortness of breath. Heart cath today.    Objective: Vitals:   04/15/16 1250 04/15/16 1255 04/15/16 1300 04/15/16 1305  BP: (!) 149/63 (!) 172/79 (!) 179/59 (!) 172/101  Pulse: 64 (!) 59 (!) 56 63  Resp:  14 18 (!) 9 (!) 21  Temp:      TempSrc:      SpO2: 93% 93% 94% 90%  Weight:      Height:        Intake/Output Summary (Last 24 hours) at 04/15/16 1334 Last data filed at 04/15/16 1016  Gross per 24 hour  Intake            273.3 ml  Output             1070 ml  Net           -796.7 ml   Filed Weights   04/14/16 1200 04/14/16 2100 04/15/16 0407  Weight: 103.4 kg (228 lb) 104.6 kg (230 lb 8 oz) 104.6 kg (230 lb 8 oz)    Examination:  General exam: Appears calm and comfortable, tearful during conversation  Respiratory system: Clear to auscultation. Respiratory effort normal. Cardiovascular system: S1 & S2 heard, RRR. No JVD, murmurs, rubs, gallops or clicks. No pedal edema. Gastrointestinal system: Abdomen is nondistended, soft and nontender. No organomegaly or masses felt. Normal bowel sounds heard. Central nervous system: Alert and oriented. No focal neurological deficits. Extremities: Symmetric 5 x 5 power. Skin: No rashes, lesions or ulcers Psychiatry: Judgement and insight appear normal. Mood & affect appropriate.   Data Reviewed: I have personally reviewed following labs and imaging studies  CBC:  Recent Labs Lab 04/14/16 1251 04/15/16 0553  WBC 6.3 7.6  HGB 12.7 13.3  HCT 39.1 41.2  MCV 88.1 89.0  PLT 259 245  Basic Metabolic Panel:  Recent Labs Lab 04/14/16 1251 04/15/16 0553  NA 138 138  K 4.5 4.5  CL 104 101  CO2 25 27  GLUCOSE 337* 201*  BUN 22* 27*  CREATININE 1.21* 1.18*  CALCIUM 9.5 9.5   GFR: Estimated Creatinine Clearance: 47.7 mL/min (by C-G formula based on SCr of 1.18 mg/dL (H)). Liver Function Tests: No results for input(s): AST, ALT, ALKPHOS, BILITOT, PROT, ALBUMIN in the last 168 hours. No results for input(s): LIPASE, AMYLASE in the last 168 hours. No results for input(s): AMMONIA in the last 168 hours. Coagulation Profile:  Recent Labs Lab 04/15/16 0553  INR 1.06   Cardiac Enzymes:  Recent Labs Lab 04/14/16 1924  04/15/16 0113 04/15/16 0553  TROPONINI 4.41* 4.87* 5.32*   BNP (last 3 results) No results for input(s): PROBNP in the last 8760 hours. HbA1C: No results for input(s): HGBA1C in the last 72 hours. CBG:  Recent Labs Lab 04/14/16 2350 04/15/16 0405 04/15/16 0755 04/15/16 1045 04/15/16 1323  GLUCAP 217* 187* 152* 133* 137*   Lipid Profile: No results for input(s): CHOL, HDL, LDLCALC, TRIG, CHOLHDL, LDLDIRECT in the last 72 hours. Thyroid Function Tests: No results for input(s): TSH, T4TOTAL, FREET4, T3FREE, THYROIDAB in the last 72 hours. Anemia Panel: No results for input(s): VITAMINB12, FOLATE, FERRITIN, TIBC, IRON, RETICCTPCT in the last 72 hours. Sepsis Labs: No results for input(s): PROCALCITON, LATICACIDVEN in the last 168 hours.  No results found for this or any previous visit (from the past 240 hour(s)).     Radiology Studies: Dg Chest 2 View  Result Date: 04/14/2016 CLINICAL DATA:  Chest pain and indigestion for 3 weeks, pain worsened today EXAM: CHEST  2 VIEW COMPARISON:  CT chest of 08/05/2015 and chest x-ray of the same day FINDINGS: There are bibasilar opacities present consistent with linear atelectasis or scarring. No pneumonia or effusion is seen. Mediastinal and hilar contours are unremarkable. Cardiomegaly is stable. There are mild degenerative changes in the thoracic spine. IMPRESSION: Bibasilar linear atelectasis or scarring. Stable cardiomegaly. No pneumonia. Electronically Signed   By: Ivar Drape M.D.   On: 04/14/2016 13:26   Ct Angio Chest Pe W Or Wo Contrast  Result Date: 04/14/2016 CLINICAL DATA:  Elevated D-dimer level. Shortness of breath. Lumbar spinal injection yesterday. EXAM: CT ANGIOGRAPHY CHEST WITH CONTRAST TECHNIQUE: Multidetector CT imaging of the chest was performed using the standard protocol during bolus administration of intravenous contrast. Multiplanar CT image reconstructions and MIPs were obtained to evaluate the vascular anatomy.  CONTRAST:  100 cc Isovue 370 COMPARISON:  Multiple exams, including 08/05/2015 FINDINGS: Despite efforts by the technologist and patient, motion artifact is present on today's exam and could not be eliminated. This reduces exam sensitivity and specificity. Body habitus reduces diagnostic sensitivity and specificity. Cardiovascular: No filling defect is identified in the pulmonary arterial tree to suggest pulmonary embolus. Reduction in sensitivity for small emboli due to motion and body habitus. Atherosclerotic calcification of the aortic arch and branch vessels. No acute thoracic aortic findings. Mild cardiomegaly primarily of the left heart. Mediastinum/Nodes: Unremarkable Lungs/Pleura: Mild atelectasis along both hemidiaphragms. Upper Abdomen: Unremarkable Musculoskeletal: Suspected fragmented spur along the right posterior glenoid. Likely chronic given appearance on prior exams. Thoracic spondylosis and degenerative disc disease. Review of the MIP images confirms the above findings. IMPRESSION: 1. No filling defect is identified in the pulmonary arterial tree to suggest pulmonary embolus. Sensitivity for small emboli is reduced due to body habitus and breathing motion artifact. 2. No acute  aortic findings or other specific cause for the patient's chest pain. 3. Mild cardiomegaly. 4. Mild atelectasis along both hemidiaphragms. 5. Thoracic spondylosis and degenerative disc disease. Electronically Signed   By: Van Clines M.D.   On: 04/14/2016 15:43      Scheduled Meds: . aspirin  81 mg Oral Pre-Cath  . [MAR Hold] aspirin EC  81 mg Oral Daily  . [MAR Hold] FLUoxetine  40 mg Oral Daily  . [MAR Hold] gabapentin  300 mg Oral QHS  . [MAR Hold] insulin aspart  0-15 Units Subcutaneous Q4H  . [MAR Hold] insulin glargine  45 Units Subcutaneous BID  . [MAR Hold] losartan  25 mg Oral Daily  . sodium chloride flush  3 mL Intravenous Q12H   Continuous Infusions: . sodium chloride 10 mL/hr at 04/15/16  0803  . sodium chloride    . heparin 1,050 Units/hr (04/15/16 0312)  . nitroGLYCERIN       LOS: 1 day    Time spent: 40 minutes   Dessa Phi, DO [facility] [email or web address] Password [email or web address] 04/15/2016, 1:34 PM

## 2016-04-16 ENCOUNTER — Encounter: Payer: PPO | Admitting: Dietician

## 2016-04-16 ENCOUNTER — Inpatient Hospital Stay (HOSPITAL_COMMUNITY)
Admission: EM | Admit: 2016-04-16 | Discharge: 2016-04-20 | DRG: 281 | Disposition: A | Discharge status: Home / Self Care | Payer: PPO | Attending: Cardiology | Admitting: Cardiology

## 2016-04-16 ENCOUNTER — Emergency Department (HOSPITAL_COMMUNITY): Payer: PPO

## 2016-04-16 ENCOUNTER — Encounter (HOSPITAL_COMMUNITY): Payer: Self-pay | Admitting: Interventional Cardiology

## 2016-04-16 DIAGNOSIS — Z794 Long term (current) use of insulin: Secondary | ICD-10-CM | POA: Diagnosis not present

## 2016-04-16 DIAGNOSIS — R072 Precordial pain: Secondary | ICD-10-CM | POA: Diagnosis not present

## 2016-04-16 DIAGNOSIS — E1165 Type 2 diabetes mellitus with hyperglycemia: Secondary | ICD-10-CM | POA: Diagnosis not present

## 2016-04-16 DIAGNOSIS — N183 Chronic kidney disease, stage 3 unspecified: Secondary | ICD-10-CM | POA: Diagnosis present

## 2016-04-16 DIAGNOSIS — Z8249 Family history of ischemic heart disease and other diseases of the circulatory system: Secondary | ICD-10-CM

## 2016-04-16 DIAGNOSIS — Z96653 Presence of artificial knee joint, bilateral: Secondary | ICD-10-CM | POA: Diagnosis not present

## 2016-04-16 DIAGNOSIS — Z85820 Personal history of malignant melanoma of skin: Secondary | ICD-10-CM

## 2016-04-16 DIAGNOSIS — Z955 Presence of coronary angioplasty implant and graft: Secondary | ICD-10-CM

## 2016-04-16 DIAGNOSIS — R079 Chest pain, unspecified: Secondary | ICD-10-CM | POA: Diagnosis not present

## 2016-04-16 DIAGNOSIS — E1122 Type 2 diabetes mellitus with diabetic chronic kidney disease: Secondary | ICD-10-CM | POA: Diagnosis not present

## 2016-04-16 DIAGNOSIS — I2510 Atherosclerotic heart disease of native coronary artery without angina pectoris: Secondary | ICD-10-CM | POA: Diagnosis not present

## 2016-04-16 DIAGNOSIS — R7989 Other specified abnormal findings of blood chemistry: Secondary | ICD-10-CM | POA: Diagnosis not present

## 2016-04-16 DIAGNOSIS — G2581 Restless legs syndrome: Secondary | ICD-10-CM

## 2016-04-16 DIAGNOSIS — R0789 Other chest pain: Principal | ICD-10-CM | POA: Diagnosis present

## 2016-04-16 DIAGNOSIS — I129 Hypertensive chronic kidney disease with stage 1 through stage 4 chronic kidney disease, or unspecified chronic kidney disease: Secondary | ICD-10-CM | POA: Diagnosis not present

## 2016-04-16 DIAGNOSIS — I119 Hypertensive heart disease without heart failure: Secondary | ICD-10-CM | POA: Diagnosis not present

## 2016-04-16 DIAGNOSIS — R0602 Shortness of breath: Secondary | ICD-10-CM | POA: Diagnosis not present

## 2016-04-16 DIAGNOSIS — Z9861 Coronary angioplasty status: Secondary | ICD-10-CM

## 2016-04-16 DIAGNOSIS — Z7982 Long term (current) use of aspirin: Secondary | ICD-10-CM

## 2016-04-16 DIAGNOSIS — Z7902 Long term (current) use of antithrombotics/antiplatelets: Secondary | ICD-10-CM

## 2016-04-16 DIAGNOSIS — F329 Major depressive disorder, single episode, unspecified: Secondary | ICD-10-CM | POA: Diagnosis present

## 2016-04-16 DIAGNOSIS — M4696 Unspecified inflammatory spondylopathy, lumbar region: Secondary | ICD-10-CM | POA: Diagnosis present

## 2016-04-16 DIAGNOSIS — T45525A Adverse effect of antithrombotic drugs, initial encounter: Secondary | ICD-10-CM | POA: Diagnosis not present

## 2016-04-16 DIAGNOSIS — R0609 Other forms of dyspnea: Secondary | ICD-10-CM

## 2016-04-16 DIAGNOSIS — R778 Other specified abnormalities of plasma proteins: Secondary | ICD-10-CM | POA: Diagnosis present

## 2016-04-16 DIAGNOSIS — Z6841 Body Mass Index (BMI) 40.0 and over, adult: Secondary | ICD-10-CM | POA: Diagnosis not present

## 2016-04-16 DIAGNOSIS — Z79899 Other long term (current) drug therapy: Secondary | ICD-10-CM | POA: Diagnosis not present

## 2016-04-16 DIAGNOSIS — Z841 Family history of disorders of kidney and ureter: Secondary | ICD-10-CM

## 2016-04-16 DIAGNOSIS — I10 Essential (primary) hypertension: Secondary | ICD-10-CM

## 2016-04-16 DIAGNOSIS — Z888 Allergy status to other drugs, medicaments and biological substances status: Secondary | ICD-10-CM

## 2016-04-16 DIAGNOSIS — D649 Anemia, unspecified: Secondary | ICD-10-CM | POA: Diagnosis not present

## 2016-04-16 DIAGNOSIS — M48061 Spinal stenosis, lumbar region without neurogenic claudication: Secondary | ICD-10-CM | POA: Diagnosis present

## 2016-04-16 DIAGNOSIS — G8929 Other chronic pain: Secondary | ICD-10-CM | POA: Diagnosis not present

## 2016-04-16 DIAGNOSIS — E1142 Type 2 diabetes mellitus with diabetic polyneuropathy: Secondary | ICD-10-CM

## 2016-04-16 DIAGNOSIS — G4733 Obstructive sleep apnea (adult) (pediatric): Secondary | ICD-10-CM | POA: Diagnosis not present

## 2016-04-16 DIAGNOSIS — Z87891 Personal history of nicotine dependence: Secondary | ICD-10-CM

## 2016-04-16 DIAGNOSIS — I214 Non-ST elevation (NSTEMI) myocardial infarction: Secondary | ICD-10-CM | POA: Diagnosis not present

## 2016-04-16 DIAGNOSIS — E782 Mixed hyperlipidemia: Secondary | ICD-10-CM | POA: Diagnosis not present

## 2016-04-16 DIAGNOSIS — E785 Hyperlipidemia, unspecified: Secondary | ICD-10-CM | POA: Diagnosis present

## 2016-04-16 DIAGNOSIS — I1310 Hypertensive heart and chronic kidney disease without heart failure, with stage 1 through stage 4 chronic kidney disease, or unspecified chronic kidney disease: Secondary | ICD-10-CM | POA: Diagnosis not present

## 2016-04-16 DIAGNOSIS — M549 Dorsalgia, unspecified: Secondary | ICD-10-CM | POA: Diagnosis present

## 2016-04-16 DIAGNOSIS — F419 Anxiety disorder, unspecified: Secondary | ICD-10-CM | POA: Diagnosis not present

## 2016-04-16 DIAGNOSIS — E1169 Type 2 diabetes mellitus with other specified complication: Secondary | ICD-10-CM | POA: Diagnosis present

## 2016-04-16 DIAGNOSIS — F411 Generalized anxiety disorder: Secondary | ICD-10-CM | POA: Diagnosis not present

## 2016-04-16 DIAGNOSIS — R748 Abnormal levels of other serum enzymes: Secondary | ICD-10-CM | POA: Diagnosis not present

## 2016-04-16 HISTORY — DX: Chronic kidney disease, stage 3 (moderate): N18.3

## 2016-04-16 HISTORY — DX: Spinal stenosis, site unspecified: M48.00

## 2016-04-16 HISTORY — DX: Ischemic cardiomyopathy: I25.5

## 2016-04-16 HISTORY — DX: Chronic kidney disease, stage 3 unspecified: N18.30

## 2016-04-16 HISTORY — DX: Polyneuropathy, unspecified: G62.9

## 2016-04-16 HISTORY — DX: Morbid (severe) obesity due to excess calories: E66.01

## 2016-04-16 HISTORY — DX: Atherosclerotic heart disease of native coronary artery without angina pectoris: I25.10

## 2016-04-16 HISTORY — DX: Chest pain, unspecified: R07.9

## 2016-04-16 LAB — CBC
HCT: 39.8 % (ref 36.0–46.0)
HEMATOCRIT: 40.2 % (ref 36.0–46.0)
HEMOGLOBIN: 13.1 g/dL (ref 12.0–15.0)
Hemoglobin: 12.5 g/dL (ref 12.0–15.0)
MCH: 28.2 pg (ref 26.0–34.0)
MCH: 29.2 pg (ref 26.0–34.0)
MCHC: 31.4 g/dL (ref 30.0–36.0)
MCHC: 32.6 g/dL (ref 30.0–36.0)
MCV: 89.6 fL (ref 78.0–100.0)
MCV: 89.5 fL (ref 78.0–100.0)
PLATELETS: 300 10*3/uL (ref 150–400)
Platelets: 281 10*3/uL (ref 150–400)
RBC: 4.44 MIL/uL (ref 3.87–5.11)
RBC: 4.49 MIL/uL (ref 3.87–5.11)
RDW: 13.1 % (ref 11.5–15.5)
RDW: 13.1 % (ref 11.5–15.5)
WBC: 7.1 10*3/uL (ref 4.0–10.5)
WBC: 5.8 10*3/uL (ref 4.0–10.5)

## 2016-04-16 LAB — BASIC METABOLIC PANEL
ANION GAP: 12 (ref 5–15)
ANION GAP: 11 (ref 5–15)
BUN: 30 mg/dL — AB (ref 6–20)
BUN: 30 mg/dL — ABNORMAL HIGH (ref 6–20)
CALCIUM: 9.1 mg/dL (ref 8.9–10.3)
CO2: 23 mmol/L (ref 22–32)
CO2: 25 mmol/L (ref 22–32)
Calcium: 9.2 mg/dL (ref 8.9–10.3)
Chloride: 103 mmol/L (ref 101–111)
Chloride: 101 mmol/L (ref 101–111)
Creatinine, Ser: 1.26 mg/dL — ABNORMAL HIGH (ref 0.44–1.00)
Creatinine, Ser: 1.29 mg/dL — ABNORMAL HIGH (ref 0.44–1.00)
GFR calc Af Amer: 47 mL/min — ABNORMAL LOW (ref >60)
GFR calc Af Amer: 46 mL/min — ABNORMAL LOW (ref >60)
GFR, EST NON AFRICAN AMERICAN: 41 mL/min — AB (ref >60)
GFR, EST NON AFRICAN AMERICAN: 39 mL/min — AB (ref >60)
GLUCOSE: 109 mg/dL — AB (ref 65–99)
GLUCOSE: 137 mg/dL — AB (ref 65–99)
POTASSIUM: 4.2 mmol/L (ref 3.5–5.1)
POTASSIUM: 3.9 mmol/L (ref 3.5–5.1)
SODIUM: 138 mmol/L (ref 135–145)
Sodium: 137 mmol/L (ref 135–145)

## 2016-04-16 LAB — I-STAT CHEM 8, ED
BUN: 14 mg/dL (ref 6–20)
CREATININE: 0.7 mg/dL (ref 0.44–1.00)
Calcium, Ion: 0.95 mmol/L — ABNORMAL LOW (ref 1.15–1.40)
Chloride: 102 mmol/L (ref 101–111)
Glucose, Bld: 134 mg/dL — ABNORMAL HIGH (ref 65–99)
HEMATOCRIT: 37 % (ref 36.0–46.0)
HEMOGLOBIN: 12.6 g/dL (ref 12.0–15.0)
POTASSIUM: 3.4 mmol/L — AB (ref 3.5–5.1)
Sodium: 135 mmol/L (ref 135–145)
TCO2: 22 mmol/L (ref 0–100)

## 2016-04-16 LAB — GLUCOSE, CAPILLARY
Glucose-Capillary: 110 mg/dL — ABNORMAL HIGH (ref 65–99)
Glucose-Capillary: 197 mg/dL — ABNORMAL HIGH (ref 65–99)
Glucose-Capillary: 103 mg/dL — ABNORMAL HIGH (ref 65–99)

## 2016-04-16 LAB — I-STAT TROPONIN, ED: Troponin i, poc: 6.34 ng/mL (ref 0.00–0.08)

## 2016-04-16 MED ORDER — ASPIRIN EC 81 MG PO TBEC
81.0000 mg | DELAYED_RELEASE_TABLET | Freq: Every day | ORAL | Status: DC
  Administered 2016-04-18 – 2016-04-20 (×3): 81 mg via ORAL
  Filled 2016-04-16 (×3): qty 1

## 2016-04-16 MED ORDER — HYDROCODONE-ACETAMINOPHEN 5-325 MG PO TABS
1.0000 | ORAL_TABLET | Freq: Four times a day (QID) | ORAL | Status: DC | PRN
  Administered 2016-04-17 – 2016-04-19 (×6): 1 via ORAL
  Filled 2016-04-16 (×8): qty 1

## 2016-04-16 MED ORDER — ROPINIROLE HCL 1 MG PO TABS
3.0000 mg | ORAL_TABLET | Freq: Three times a day (TID) | ORAL | Status: DC
  Administered 2016-04-17 – 2016-04-20 (×15): 3 mg via ORAL
  Filled 2016-04-16 (×17): qty 3

## 2016-04-16 MED ORDER — LOSARTAN POTASSIUM 50 MG PO TABS
50.0000 mg | ORAL_TABLET | Freq: Every day | ORAL | Status: DC
  Administered 2016-04-16: 50 mg via ORAL

## 2016-04-16 MED ORDER — ASPIRIN 81 MG PO CHEW: 324.0000 mg | CHEWABLE_TABLET | ORAL | Status: DC

## 2016-04-16 MED ORDER — FLUOXETINE HCL 20 MG PO CAPS
40.0000 mg | ORAL_CAPSULE | Freq: Every day | ORAL | Status: DC
  Administered 2016-04-17 – 2016-04-19 (×3): 40 mg via ORAL
  Filled 2016-04-16 (×4): qty 2

## 2016-04-16 MED ORDER — ASPIRIN 300 MG RE SUPP: 300.0000 mg | RECTAL | Status: DC

## 2016-04-16 MED ORDER — GABAPENTIN 300 MG PO CAPS
300.0000 mg | ORAL_CAPSULE | Freq: Every day | ORAL | Status: DC
  Administered 2016-04-17 – 2016-04-19 (×4): 300 mg via ORAL
  Filled 2016-04-16 (×5): qty 1

## 2016-04-16 MED ORDER — ACETAMINOPHEN 325 MG PO TABS
650.0000 mg | ORAL_TABLET | ORAL | Status: DC | PRN
  Administered 2016-04-18 – 2016-04-20 (×5): 650 mg via ORAL
  Filled 2016-04-16 (×4): qty 2

## 2016-04-16 MED ORDER — TIZANIDINE HCL 4 MG PO TABS
4.0000 mg | ORAL_TABLET | Freq: Four times a day (QID) | ORAL | Status: DC | PRN
  Administered 2016-04-17 – 2016-04-19 (×7): 4 mg via ORAL
  Filled 2016-04-16 (×7): qty 1

## 2016-04-16 MED ORDER — NITROGLYCERIN 0.4 MG SL SUBL: 0.4000 mg | SUBLINGUAL_TABLET | SUBLINGUAL | Status: DC | PRN

## 2016-04-16 MED ORDER — ASPIRIN EC 81 MG PO TBEC: 81.0000 mg | DELAYED_RELEASE_TABLET | Freq: Every day | ORAL | Status: DC

## 2016-04-16 MED ORDER — TICAGRELOR 90 MG PO TABS
90.0000 mg | ORAL_TABLET | Freq: Two times a day (BID) | ORAL | Status: DC
  Administered 2016-04-17 – 2016-04-18 (×5): 90 mg via ORAL
  Filled 2016-04-16 (×5): qty 1

## 2016-04-16 MED ORDER — HEPARIN (PORCINE) IN NACL 100-0.45 UNIT/ML-% IJ SOLN
1400.0000 [IU]/h | INTRAMUSCULAR | Status: DC
Start: 2016-04-17 — End: 2016-04-17
  Administered 2016-04-17: 1200 [IU]/h via INTRAVENOUS
  Filled 2016-04-16: qty 250

## 2016-04-16 MED ORDER — ROPINIROLE HCL 1 MG PO TABS
2.0000 mg | ORAL_TABLET | Freq: Once | ORAL | Status: AC
  Administered 2016-04-16: 02:00:00 2 mg via ORAL
  Filled 2016-04-16 (×2): qty 2

## 2016-04-16 MED ORDER — SODIUM CHLORIDE 0.9% FLUSH: 3.0000 mL | INTRAVENOUS | Status: DC | PRN

## 2016-04-16 MED ORDER — ATORVASTATIN CALCIUM 80 MG PO TABS: 80.0000 mg | ORAL_TABLET | Freq: Every day | ORAL | 0 refills | Status: DC

## 2016-04-16 MED ORDER — LOSARTAN POTASSIUM 50 MG PO TABS
50.0000 mg | ORAL_TABLET | Freq: Every day | ORAL | Status: DC
Start: 2016-04-17 — End: 2016-04-20
  Administered 2016-04-17 – 2016-04-20 (×4): 50 mg via ORAL
  Filled 2016-04-16 (×4): qty 1

## 2016-04-16 MED ORDER — ONDANSETRON HCL 4 MG/2ML IJ SOLN
4.0000 mg | Freq: Four times a day (QID) | INTRAMUSCULAR | Status: DC | PRN
  Administered 2016-04-19: 4 mg via INTRAVENOUS
  Filled 2016-04-16 (×2): qty 2

## 2016-04-16 MED ORDER — SODIUM CHLORIDE 0.9% FLUSH
3.0000 mL | Freq: Two times a day (BID) | INTRAVENOUS | Status: DC
  Administered 2016-04-17 – 2016-04-20 (×7): 3 mL via INTRAVENOUS

## 2016-04-16 MED ORDER — LOSARTAN POTASSIUM 50 MG PO TABS: 50.0000 mg | ORAL_TABLET | Freq: Every day | ORAL | 0 refills | Status: DC

## 2016-04-16 MED ORDER — TICAGRELOR 90 MG PO TABS: 90.0000 mg | ORAL_TABLET | Freq: Two times a day (BID) | ORAL | 0 refills | Status: DC

## 2016-04-16 MED ORDER — ATORVASTATIN CALCIUM 80 MG PO TABS
80.0000 mg | ORAL_TABLET | Freq: Every day | ORAL | Status: DC
  Administered 2016-04-17 – 2016-04-19 (×3): 80 mg via ORAL
  Filled 2016-04-16 (×4): qty 1

## 2016-04-16 MED ORDER — SODIUM CHLORIDE 0.9 % IV SOLN: 250.0000 mL | INTRAVENOUS | Status: DC | PRN

## 2016-04-16 NOTE — ED Notes (Addendum)
Washed pt's wound with saline, applied pressure to site and taped gauze to the wound to hold continuous pressure. The wound is still actively bleeding.

## 2016-04-16 NOTE — Progress Notes (Signed)
ANTICOAGULATION CONSULT NOTE - Initial Consult  Pharmacy Consult for Heparin  Indication: chest pain/ACS  Allergies  Allergen Reactions  . Lyrica [Pregabalin] Other (See Comments)    Blurred vision  . Aspirin Other (See Comments)    Large doses per patient  . Crestor [Rosuvastatin] Other (See Comments)    Leg cramps  . Lipitor [Atorvastatin] Other (See Comments)    Leg cramps  . Minocycline Hcl Other (See Comments)    REACTION: Reaction not known  . Oruvail [Ketoprofen] Other (See Comments)  . Tricor [Fenofibrate] Other (See Comments)  . Vasotec [Enalaprilat] Other (See Comments)  . Victoza [Liraglutide] Other (See Comments)  . Zinc Other (See Comments)    Patient Measurements: Height: 5\' 3"  (160 cm) Weight: 228 lb 8 oz (103.6 kg) IBW/kg (Calculated) : 52.4 Vital Signs: Temp: 98.3 F (36.8 C) (12/08 2243) Temp Source: Oral (12/08 2243) BP: 136/58 (12/08 2243) Pulse Rate: 78 (12/08 2243)  Labs:  Recent Labs  04/15/16 0113 04/15/16 0553 04/15/16 0940 04/15/16 1901 04/15/16 2128 04/16/16 0231 04/16/16 1813 04/16/16 1843  HGB  --  13.3  --   --  12.6 12.5 13.1 12.6  HCT  --  41.2  --   --  39.2 39.8 40.2 37.0  PLT  --  245  --   --  283 300 281  --   LABPROT  --  13.8  --   --   --   --   --   --   INR  --  1.06  --   --   --   --   --   --   HEPARINUNFRC 0.13*  --  0.25*  --   --   --   --   --   CREATININE  --  1.18*  --  1.10*  --  1.26* 1.29* 0.70  TROPONINI 4.87* 5.32*  --  5.81*  --   --   --   --     Estimated Creatinine Clearance: 69.9 mL/min (by C-G formula based on SCr of 0.7 mg/dL).   Medical History: Past Medical History:  Diagnosis Date  . Anemia    hx (04/14/2016)  . Anxiety   . Arthritis    "severe in my back; hands; ankles" (04/14/2016)  . Basal cell carcinoma    "several burned off; one cut off"  . Chronic lower back pain   . Depression   . DJD (degenerative joint disease)   . Family history of adverse reaction to anesthesia    "half-sister used to get real sick" (04/14/2016)  . GERD (gastroesophageal reflux disease)   . History of gout   . Hyperlipidemia   . Hypertension   . IBS (irritable bowel syndrome)   . 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 ([place]) ~ 2015  . NSTEMI (non-ST elevated myocardial infarction) ([place]) 04/14/2016  . Obesity   . OSA on CPAP   . RLS (restless legs syndrome)   . Type II diabetes mellitus ([place])   . Vitamin D deficiency     Assessment: Recent NSTEMI with stent placement 12/7, now with new onset of chest pain, troponin elevated, starting heparin drip, CBC good, renal function good, PTA meds reviewed.  Goal of Therapy:  Heparin level 0.3-0.7 units/ml Monitor platelets by anticoagulation protocol: Yes   Plan:  -Will avoid bolus given that patient just had cath yesterday -Start heparin drip at 1200 units/hr -0800 HL -Daily CBC/HL -Monitor for bleeding  Narda Bonds 04/16/2016,11:50 PM

## 2016-04-16 NOTE — H&P (Signed)
Patient ID: Janet Choi MRN: [id number], DOB/AGE: Mar 20, 1941   Admit date: 04/16/2016   Primary Physician: Alesia Richards, MD Primary Cardiologist: Dr. Oval Linsey  Reason for admission: chest pain  Pt. Profile:  Janet Choi is a 75 y.o. female with a history of anemia, diabetes, HTN, hyperlipidemia and recently diagnosed CAD with NSTEMI s/p DES to  LAD and LCx (discharged today) who presented back to [facility] ED for chest pain.   She reports she was sitting at home on the phone when she developed severe 10/10 burning pain in epigastric area and SOB. This pain was very different from her previous chest pain with NSTEMI. Pain lasted approx 15 minutes, resolved with ASA and NG. According to EMS SBP initially 210, decreased to 150 with NG. She is currently pain free.    Problem List  Past Medical History:  Diagnosis Date  . Anemia    hx (04/14/2016)  . Anxiety   . Arthritis    "severe in my back; hands; ankles" (04/14/2016)  . Basal cell carcinoma    "several burned off; one cut off"  . Chronic lower back pain   . Depression   . DJD (degenerative joint disease)   . Family history of adverse reaction to anesthesia    "half-sister used to get real sick" (04/14/2016)  . GERD (gastroesophageal reflux disease)   . History of gout   . Hyperlipidemia   . Hypertension   . IBS (irritable bowel syndrome)   . 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 ([place]) ~ 2015  . NSTEMI (non-ST elevated myocardial infarction) ([place]) 04/14/2016  . Obesity   . OSA on CPAP   . RLS (restless legs syndrome)   . Type II diabetes mellitus ([place])   . Vitamin D deficiency     Past Surgical History:  Procedure Laterality Date  . APPENDECTOMY    . BACK SURGERY    . BASAL CELL CARCINOMA EXCISION Left    leg  . CARDIAC CATHETERIZATION N/A 04/15/2016   Procedure: Left Heart Cath and Coronary Angiography;  Surgeon: Belva Crome, MD;  Location: [place] [facility];  Service: Cardiovascular;  Laterality:  N/A;  . CARDIAC CATHETERIZATION N/A 04/15/2016   Procedure: Coronary Stent Intervention;  Surgeon: Belva Crome, MD;  Location: [place] [facility];  Service: Cardiovascular;  Laterality: N/A;  Mid LAD Mid CFX  . CATARACT EXTRACTION, BILATERAL Bilateral   . JOINT REPLACEMENT    . LUMBAR DISC SURGERY  X 2  . MELANOMA EXCISION Left    "towards the back of my neck"  . SHOULDER ARTHROSCOPY W/ ROTATOR CUFF REPAIR Left   . SHOULDER OPEN ROTATOR CUFF REPAIR Right   . TOTAL KNEE ARTHROPLASTY Bilateral      Allergies  Allergies  Allergen Reactions  . Aspirin     Large doses per patient  . Crestor [Rosuvastatin]     Leg crams  . Lipitor [Atorvastatin]     Leg crams  . Lyrica [Pregabalin] Other (See Comments)    Blurred vision  . Minocycline Hcl     REACTION: Reaction not known  . Oruvail [Ketoprofen]   . Tricor [Fenofibrate]   . Vasotec [Enalaprilat]   . Victoza [Liraglutide]   . Zinc      Home Medications  Prior to Admission medications   Medication Sig Start Date End Date Taking? Authorizing Provider  rOPINIRole (REQUIP) 3 MG tablet Take 1 tablet 4 x / day for Restless Legs Patient taking differently:  Take 3 mg by mouth 4 (four) times daily. Take 1 tablet 4 x / day for Restless Legs 10/21/15 04/21/16 Yes [name], MD  aspirin EC 81 MG tablet Take 81 mg by mouth daily.    Historical Provider, MD  atorvastatin (LIPITOR) 80 MG tablet Take 1 tablet (80 mg total) by mouth daily at 6 PM. 04/16/16   Shon Millet, DO  BD PEN NEEDLE NANO U/F 32G X 4 MM MISC 2 (two) times daily. as directed 09/30/15   Historical Provider, MD  Cholecalciferol (VITAMIN D) 2000 UNITS tablet Take 10,000 Units by mouth daily.     Historical Provider, MD  diazepam (VALIUM) 5 MG tablet Take 1 tablet (5 mg total) by mouth at bedtime as needed for anxiety. Patient not taking: Reported on 04/14/2016 02/18/16   Courtney Forcucci, PA-C  FLECTOR 1.3 % PTCH APPLY 1 PATCH EVERY 12 HOURS AS NEEDED THEN  TAKE OFF FOR 12 HOURS 10/03/15   Historical Provider, MD  FLUoxetine (PROZAC) 40 MG capsule TAKE 1 CAPSULE BY MOUTH DAILY. 01/07/16   [name], MD  furosemide (LASIX) 40 MG tablet TAKE 1 TABLET 3 TIMES A DAY BEFORE MEALS FOR EDEMA ( STOP TAKING HCTZ) 02/02/16   [name], MD  gabapentin (NEURONTIN) 600 MG tablet Take 300 mg by mouth at bedtime.    Historical Provider, MD  Glucosamine-MSM-Hyaluronic Acd (JOINT HEALTH PO) Take by mouth daily.    Historical Provider, MD  glucose blood (FREESTYLE LITE) test strip Check blood sugar 3 x / day as instructed Patient not taking: Reported on 03/16/2016 12/05/15 06/06/17  [name], MD  HYDROcodone-acetaminophen (NORCO/VICODIN) 5-325 MG tablet Take 1 tablet by mouth every 6 (six) hours as needed. 04/08/16   Historical Provider, MD  insulin NPH-regular Human (NOVOLIN 70/30) (70-30) 100 UNIT/ML injection Inject 40-60 Units into the skin 2 (two) times daily. 40 units in the morning and 60 units at bedtime    Historical Provider, MD  Insulin Syringe-Needle U-100 (BD INSULIN SYRINGE ULTRAFINE) 31G X 15/64" 1 ML MISC Use 2x a day 03/16/16   Philemon Kingdom, MD  Lactobacillus (PROBIOTIC ACIDOPHILUS PO) Take by mouth.    Historical Provider, MD  losartan (COZAAR) 50 MG tablet Take 1 tablet (50 mg total) by mouth daily. 04/16/16   Shon Millet, DO  Magnesium 400 MG CAPS Take 1 capsule by mouth 2 (two) times daily.     Historical Provider, MD  NOVOLIN 70/30 (70-30) 100 UNIT/ML injection INJECT 50 UNITS TWICE DAILY Patient not taking: Reported on 04/14/2016 04/11/16   [name], MD  OVER THE COUNTER MEDICATION 150 mg at bedtime. CVS acid reduced 75 mg at bedtime    Historical Provider, MD  [place] daily. Slow Released Iron    Historical Provider, MD  potassium chloride SA (K-DUR,KLOR-CON) 20 MEQ tablet Take 1 tablet (20 mEq total) by mouth 2 (two) times daily. Patient not taking: Reported on 04/14/2016 02/11/16   [name], PA-C  rizatriptan (MAXALT) 10 MG tablet Take 1 tablet (10 mg total) by mouth once as needed for migraine. May repeat in 2 hours if needed Patient not taking: Reported on 04/14/2016 11/14/15 11/13/16  [name], PA-C  ticagrelor (BRILINTA) 90 MG TABS tablet Take 1 tablet (90 mg total) by mouth 2 (two) times daily. 04/16/16   Jennifer Chahn-Yang Choi, DO  tiZANidine (ZANAFLEX) 4 MG tablet Take 4 mg by mouth every 6 (six) hours as needed. MUSCLE SPASM 04/06/16   Historical Provider, MD  Family History  Family History  Problem Relation Age of Onset  . Heart disease Mother   . Kidney disease Father   . Colon cancer Neg Hx   . Colon polyps Neg Hx   . Esophageal cancer Neg Hx   . Pancreatic cancer Neg Hx   . Stomach cancer Neg Hx   . Liver disease Neg Hx   . Diabetes Neg Hx       check Family Status  Relation Status  . Mother Deceased  . Father Deceased  . Brother Deceased  . Neg Hx      Social History  Social History   Social History  . Marital status: Widowed    Spouse name: N/A  . Number of children: N/A  . Years of education: N/A   Occupational History  . Not on file.   Social History Main Topics  . Smoking status: Former Smoker    Packs/day: 2.50    Years: 25.00    Types: Cigarettes    Quit date: 05/11/1983  . Smokeless tobacco: Never Used  . Alcohol use No  . Drug use: No  . Sexual activity: No   Other Topics Concern  . Not on file   Social History Narrative  . No narrative on file     Review of Systems General:  No chills, fever, night sweats or weight changes.  Cardiovascular:  Per HPI Dermatological: No rash, lesions/masses Respiratory: No cough, dyspnea Urologic: No hematuria, dysuria Abdominal:   No nausea, vomiting, diarrhea, bright red blood per rectum, melena, or hematemesis Neurologic:  No visual changes, wkns, changes in mental status. All other systems reviewed and are otherwise negative except as noted above.  Physical  Exam  Blood pressure 139/59, pulse 71, temperature 97.8 F (36.6 C), temperature source Oral, resp. rate 17, height 5\' 3"  (1.6 m), weight 230 lb (104.3 kg), SpO2 94 %.  General: Pleasant, NAD Psych: Normal affect. Neuro: Alert and oriented X 3. Moves all extremities spontaneously. HEENT: Normal  Neck: Supple without bruits or JVD. Lungs:  Resp regular and unlabored, CTA. Heart: RRR no s3, s4, or murmurs. Abdomen: Soft, non-tender, non-distended, BS + x 4.  Extremities: trace bilateral LE edema  Labs   Recent Labs  04/14/16 1924 04/15/16 0113 04/15/16 0553 04/15/16 1901  TROPONINI 4.41* 4.87* 5.32* 5.81*   Lab Results  Component Value Date   WBC 5.8 04/16/2016   HGB 12.6 04/16/2016   HCT 37.0 04/16/2016   MCV 89.5 04/16/2016   PLT 281 04/16/2016    Recent Labs Lab 04/16/16 1813 04/16/16 1843  NA 137 135  K 3.9 3.4*  CL 101 102  CO2 25  --   BUN 30* 14  CREATININE 1.29* 0.70  CALCIUM 9.2  --   GLUCOSE 137* 134*   Lab Results  Component Value Date   CHOL 360 (H) 10/21/2015   HDL 38 (L) 10/21/2015   LDLCALC NOT CALC 10/21/2015   TRIG 658 (H) 10/21/2015   Lab Results  Component Value Date   DDIMER 0.99 (H) 04/14/2016     Radiology/Studies  Dg Chest 2 View  Result Date: 04/14/2016 CLINICAL DATA:  Chest pain and indigestion for 3 weeks, pain worsened today EXAM: CHEST  2 VIEW COMPARISON:  CT chest of 08/05/2015 and chest x-ray of the same day FINDINGS: There are bibasilar opacities present consistent with linear atelectasis or scarring. No pneumonia or effusion is seen. Mediastinal and hilar contours are unremarkable. Cardiomegaly is stable. There are mild degenerative  changes in the thoracic spine. IMPRESSION: Bibasilar linear atelectasis or scarring. Stable cardiomegaly. No pneumonia. Electronically Signed   By: Ivar Drape M.D.   On: 04/14/2016 13:26   Ct Angio Chest Pe W Or Wo Contrast  Result Date: 04/14/2016 CLINICAL DATA:  Elevated D-dimer level.  Shortness of breath. Lumbar spinal injection yesterday. EXAM: CT ANGIOGRAPHY CHEST WITH CONTRAST TECHNIQUE: Multidetector CT imaging of the chest was performed using the standard protocol during bolus administration of intravenous contrast. Multiplanar CT image reconstructions and MIPs were obtained to evaluate the vascular anatomy. CONTRAST:  100 cc Isovue 370 COMPARISON:  Multiple exams, including 08/05/2015 FINDINGS: Despite efforts by the technologist and patient, motion artifact is present on today's exam and could not be eliminated. This reduces exam sensitivity and specificity. Body habitus reduces diagnostic sensitivity and specificity. Cardiovascular: No filling defect is identified in the pulmonary arterial tree to suggest pulmonary embolus. Reduction in sensitivity for small emboli due to motion and body habitus. Atherosclerotic calcification of the aortic arch and Janet Choi vessels. No acute thoracic aortic findings. Mild cardiomegaly primarily of the left heart. Mediastinum/Nodes: Unremarkable Lungs/Pleura: Mild atelectasis along both hemidiaphragms. Upper Abdomen: Unremarkable Musculoskeletal: Suspected fragmented spur along the right posterior glenoid. Likely chronic given appearance on prior exams. Thoracic spondylosis and degenerative disc disease. Review of the MIP images confirms the above findings. IMPRESSION: 1. No filling defect is identified in the pulmonary arterial tree to suggest pulmonary embolus. Sensitivity for small emboli is reduced due to body habitus and breathing motion artifact. 2. No acute aortic findings or other specific cause for the patient's chest pain. 3. Mild cardiomegaly. 4. Mild atelectasis along both hemidiaphragms. 5. Thoracic spondylosis and degenerative disc disease. Electronically Signed   By: Van Clines M.D.   On: 04/14/2016 15:43   Mr Lumbar Spine Wo Contrast  Result Date: 03/30/2016 CLINICAL DATA:  Severe low back pain for 12 days. Two prior lumbar spine  surgeries. EXAM: MRI LUMBAR SPINE WITHOUT CONTRAST TECHNIQUE: Multiplanar, multisequence MR imaging of the lumbar spine was performed. No intravenous contrast was administered. COMPARISON:  07/15/2015 FINDINGS: Segmentation:  Standard. Alignment: Mild lumbar levoscoliosis. Unchanged 4 mm anterolisthesis of L3 on L4. Vertebrae: No evidence of fracture or destructive osseous lesion. Moderate type 1 degenerative endplate changes at [id number] have mildly increased. The lumbar spinal canal is mildly small in caliber diffusely on a congenital basis due to short pedicles. Conus medullaris: Extends to the L1-2 level and appears normal. Paraspinal and other soft tissues: 1.8 cm T2 hyperintense left renal lesion, likely a cyst. Postsurgical changes in the posterior paraspinal soft tissues with asymmetric left lower lumbar paraspinal muscle atrophy. Disc levels: Disc desiccation throughout the lumbar spine. Similar appearance of multilevel disc space narrowing, mild at L1-2, mild-to-moderate at L2-3, and severe at L3-4 and L4-5. T10-11: Only imaged sagittally. Mild disc bulging without evidence of compressive stenosis, unchanged. T11-12: Only imaged sagittally. Mild disc bulging without evidence of compressive stenosis, unchanged. T12-L1: Mild disc bulging and mild facet arthrosis result in borderline spinal and borderline left neural foraminal stenosis, unchanged. L1-2: Mild disc bulging and mild facet arthrosis. Small right subarticular disc protrusion versus artifact. No significant stenosis identified. L2-3: Disc bulging asymmetric to the left, ligamentum flavum thickening, and moderate right and mild left facet hypertrophy result in mild-to-moderate spinal stenosis and mild bilateral neural foraminal stenosis, unchanged. Small right and trace left facet joint effusions. L3-4: Prior right laminectomy. Listhesis with bulging uncovered disc, endplate spurring, left ligamentum flavum thickening, and advanced facet arthrosis result  in moderate to severe spinal stenosis and moderate to severe right and moderate left neural foraminal stenosis, unchanged. L4-5: Prior left laminotomy. Disc bulging, left paracentral disc protrusion, endplate spurring, and mild facet arthrosis result in mild-to-moderate spinal stenosis, mild right and moderate left lateral recess stenosis, and moderate right and moderate to severe left neural foraminal stenosis, unchanged. L5-S1: Disc bulging, ligamentum flavum thickening, and advanced facet arthrosis result in mild spinal stenosis, left greater than right lateral recess stenosis, and moderate right and severe left neural foraminal stenosis, unchanged. Moderate right and small left facet joint effusions. IMPRESSION: 1. Increased degenerative endplate edema at [id number]. 2. Otherwise unchanged lumbar disc and facet degeneration as detailed above. Moderate to severe spinal stenosis at L3-4. Electronically Signed   By: Logan Bores M.D.   On: 03/30/2016 08:38      ASSESSMENT AND PLAN 1. CAD - 75 yo female with history of CAD with admit with NSTEMI just a few days ago. Cath 04/15/16 she received  DES to mid LAD and ES to mid LCX. The intervention on the LCX was complicated by a microembolization to the most distal OM. A D1 80% was small vessel and managed medically. She was discharged just this morning - Onset of chest pain at home. According to records on initial evaluation by EMS bp 210, given NG with improved chest pain and bp. Chest pain very different from her prior MI.  -  Presents with severe HTN and chest pain. Note her troponin during last admission never truly peaked, she also had a microembolization during cath which may have led to further increase in troponin at that time. The fact her istat troponin is hgher than last checked troponin is not neccesarily indicative that there is a new event. EKG without acute ischemic changes. Will need to be monitored overnight night with cycling of enzymes, we will  start heparin gtt overnight, continue DAPT and medical therapy. She may or may not require a second look cath pending the patterns of her symptoms, EKG, and enzymes overnight.  - start PPI    2. HTN - continue recent discharge meds, titrate as needed.  -  note her losartan was increased just today at discharge to 50mg  daily, follow bp overnight. No beta blocker due to bradycardia per notes. Can titrate losartan further if renal function will allow, if not could consider norvasc.     Carlyle Dolly MD

## 2016-04-16 NOTE — Discharge Summary (Addendum)
Physician Discharge Summary  Janet Choi HKV:[id number] DOB: 1940-07-06 DOA: 04/14/2016  PCP: Alesia Richards, MD  Admit date: 04/14/2016 Discharge date: 04/16/2016  Admitted From: Home Disposition:  Home  Recommendations for Outpatient Follow-up:  1. Follow up with PCP in 1-2 weeks 2. Follow up with Cardiology in 1-2 weeks  3. Cardiac rehab referral placed  4. Repeat BMP in 1 week  5. Need close follow up for uncontrolled diabetes, Ha1c 10.3. Met with DM coordinator during hospitalization.   Home Health: No  Equipment/Devices: None   Discharge Condition: Stable CODE STATUS: Full  Diet recommendation: Heart healthy  Brief/Interim Summary: From H&P: Janet Choi a 75 y.o.femalewith medical history significant for obesity, OSA, hypertension, insulin-dependent diabetes mellitus, and chronic back pain who presents to the emergency department with 1-2 weeks of intermittent chest pain that became much worse. She thought it was heart burn and took tums without relief. She describes chest pain as a central pain that is dull and heavy in nature. On presentation to the ED, troponin was elevated and patient seen by cardiology. She underwent heart cath 12/7 and subsequently stent placed LAD and Lcx.   Subjective on day of discharge: Feeling well today. Denies any chest pain or shortness of breath. Has chronic back pain, which has unchanged. Ready for discharge.   Discharge Diagnoses:  Principal Problem:   NSTEMI (non-ST elevated myocardial infarction) ([place]) Active Problems:   Hyperlipidemia   Essential hypertension   Poorly controlled type 2 diabetes mellitus with peripheral neuropathy (HCC)   OSA on CPAP   CKD (chronic kidney disease), stage III   Back pain, subacute   Hypertensive heart disease without heart failure   S/P coronary artery stent placement   NSTEMI -Cardiology following -Heart cath 12/7 with DES LAD and Lcx  -Will discharge with aspirin, brilinta, statin,  losartan  -No BB due to bradycardia    Insulin-dependent Dm, uncontrolled -Ha1c 10.3 -Takes novolin 70/30 at home, will hold and start lantus 45u BID, SSI. Resume home tx. Will need close follow up for better control of DM   HLD -Start lipitor, if fails, will need PCSK9 inhibitor/refer to lipid clinic   Chronic back pain -Rcv steroid injections as outpatient  Essential HTN -Continue losartan, dose increased to '50mg'$  daily   OSA -CPAP qhs  CKD stage 3 -Cr 1.2 on admission, which is baseline -Trend   Discharge Instructions  Discharge Instructions    AMB Referral to Cardiac Rehabilitation - Phase II    Complete by:  As directed    Diagnosis:  NSTEMI   Amb Referral to Cardiac Rehabilitation    Complete by:  As directed    Diagnosis:   Coronary Stents NSTEMI     Diet - low sodium heart healthy    Complete by:  As directed    Increase activity slowly    Complete by:  As directed        Medication List    STOP taking these medications   metolazone 2.5 MG tablet Commonly known as:  ZAROXOLYN     TAKE these medications   aspirin EC 81 MG tablet Take 81 mg by mouth daily.   atorvastatin 80 MG tablet Commonly known as:  LIPITOR Take 1 tablet (80 mg total) by mouth daily at 6 PM.   BD PEN NEEDLE NANO U/F 32G X 4 MM Misc Generic drug:  Insulin Pen Needle 2 (two) times daily. as directed   diazepam 5 MG tablet Commonly known as:  VALIUM  Take 1 tablet (5 mg total) by mouth at bedtime as needed for anxiety.   FLECTOR 1.3 % Ptch Generic drug:  diclofenac APPLY 1 PATCH EVERY 12 HOURS AS NEEDED THEN TAKE OFF FOR 12 HOURS   FLUoxetine 40 MG capsule Commonly known as:  PROZAC TAKE 1 CAPSULE BY MOUTH DAILY.   furosemide 40 MG tablet Commonly known as:  LASIX TAKE 1 TABLET 3 TIMES A DAY BEFORE MEALS FOR EDEMA ( STOP TAKING HCTZ)   gabapentin 600 MG tablet Commonly known as:  NEURONTIN Take 300 mg by mouth at bedtime.   glucose blood test strip Commonly  known as:  FREESTYLE LITE Check blood sugar 3 x / day as instructed   HYDROcodone-acetaminophen 5-325 MG tablet Commonly known as:  NORCO/VICODIN Take 1 tablet by mouth every 6 (six) hours as needed.   Insulin Syringe-Needle U-100 31G X 15/64" 1 ML Misc Commonly known as:  BD INSULIN SYRINGE ULTRAFINE Use 2x a day   JOINT HEALTH PO Take by mouth daily.   losartan 50 MG tablet Commonly known as:  COZAAR Take 1 tablet (50 mg total) by mouth daily. What changed:  See the new instructions.   Magnesium 400 MG Caps Take 1 capsule by mouth 2 (two) times daily.   insulin NPH-regular Human (70-30) 100 UNIT/ML injection Commonly known as:  NOVOLIN 70/30 Inject 40-60 Units into the skin 2 (two) times daily. 40 units in the morning and 60 units at bedtime   NOVOLIN 70/30 (70-30) 100 UNIT/ML injection Generic drug:  insulin NPH-regular Human INJECT 50 UNITS TWICE DAILY   OVER THE COUNTER MEDICATION 150 mg at bedtime. CVS acid reduced 75 mg at bedtime   OVER THE COUNTER MEDICATION daily. Slow Released Iron   potassium chloride SA 20 MEQ tablet Commonly known as:  K-DUR,KLOR-CON Take 1 tablet (20 mEq total) by mouth 2 (two) times daily.   PROBIOTIC ACIDOPHILUS PO Take by mouth.   rizatriptan 10 MG tablet Commonly known as:  MAXALT Take 1 tablet (10 mg total) by mouth once as needed for migraine. May repeat in 2 hours if needed   rOPINIRole 3 MG tablet Commonly known as:  REQUIP Take 1 tablet 4 x / day for Restless Legs   ticagrelor 90 MG Tabs tablet Commonly known as:  BRILINTA Take 1 tablet (90 mg total) by mouth 2 (two) times daily.   tiZANidine 4 MG tablet Commonly known as:  ZANAFLEX Take 4 mg by mouth every 6 (six) hours as needed. MUSCLE SPASM   Vitamin D 2000 units tablet Take 10,000 Units by mouth daily.      Follow-up Information    MCKEOWN,WILLIAM DAVID, MD. Schedule an appointment as soon as possible for a visit in 1 week(s).   Specialty:  Internal  Medicine Contact information: [address] [place] [address] [phone]        Cardiology. Schedule an appointment as soon as possible for a visit in 1 week(s).   Why:  They will arrange for follow up.          Allergies  Allergen Reactions  . Aspirin     Large doses per patient  . Crestor [Rosuvastatin]     Leg crams  . Lipitor [Atorvastatin]     Leg crams  . Lyrica [Pregabalin] Other (See Comments)    Blurred vision  . Minocycline Hcl     REACTION: Reaction not known  . Oruvail [Ketoprofen]   . Tricor [Fenofibrate]   . Vasotec [  Enalaprilat]   . Victoza [Liraglutide]   . Zinc     Consultations:  Cardiology    Procedures/Studies: Dg Chest 2 View  Result Date: 04/14/2016 CLINICAL DATA:  Chest pain and indigestion for 3 weeks, pain worsened today EXAM: CHEST  2 VIEW COMPARISON:  CT chest of 08/05/2015 and chest x-ray of the same day FINDINGS: There are bibasilar opacities present consistent with linear atelectasis or scarring. No pneumonia or effusion is seen. Mediastinal and hilar contours are unremarkable. Cardiomegaly is stable. There are mild degenerative changes in the thoracic spine. IMPRESSION: Bibasilar linear atelectasis or scarring. Stable cardiomegaly. No pneumonia. Electronically Signed   By: Ivar Drape M.D.   On: 04/14/2016 13:26   Ct Angio Chest Pe W Or Wo Contrast  Result Date: 04/14/2016 CLINICAL DATA:  Elevated D-dimer level. Shortness of breath. Lumbar spinal injection yesterday. EXAM: CT ANGIOGRAPHY CHEST WITH CONTRAST TECHNIQUE: Multidetector CT imaging of the chest was performed using the standard protocol during bolus administration of intravenous contrast. Multiplanar CT image reconstructions and MIPs were obtained to evaluate the vascular anatomy. CONTRAST:  100 cc Isovue 370 COMPARISON:  Multiple exams, including 08/05/2015 FINDINGS: Despite efforts by the technologist and patient, motion artifact is present on today's  exam and could not be eliminated. This reduces exam sensitivity and specificity. Body habitus reduces diagnostic sensitivity and specificity. Cardiovascular: No filling defect is identified in the pulmonary arterial tree to suggest pulmonary embolus. Reduction in sensitivity for small emboli due to motion and body habitus. Atherosclerotic calcification of the aortic arch and branch vessels. No acute thoracic aortic findings. Mild cardiomegaly primarily of the left heart. Mediastinum/Nodes: Unremarkable Lungs/Pleura: Mild atelectasis along both hemidiaphragms. Upper Abdomen: Unremarkable Musculoskeletal: Suspected fragmented spur along the right posterior glenoid. Likely chronic given appearance on prior exams. Thoracic spondylosis and degenerative disc disease. Review of the MIP images confirms the above findings. IMPRESSION: 1. No filling defect is identified in the pulmonary arterial tree to suggest pulmonary embolus. Sensitivity for small emboli is reduced due to body habitus and breathing motion artifact. 2. No acute aortic findings or other specific cause for the patient's chest pain. 3. Mild cardiomegaly. 4. Mild atelectasis along both hemidiaphragms. 5. Thoracic spondylosis and degenerative disc disease. Electronically Signed   By: Van Clines M.D.   On: 04/14/2016 15:43   Mr Lumbar Spine Wo Contrast  Result Date: 03/30/2016 CLINICAL DATA:  Severe low back pain for 12 days. Two prior lumbar spine surgeries. EXAM: MRI LUMBAR SPINE WITHOUT CONTRAST TECHNIQUE: Multiplanar, multisequence MR imaging of the lumbar spine was performed. No intravenous contrast was administered. COMPARISON:  07/15/2015 FINDINGS: Segmentation:  Standard. Alignment: Mild lumbar levoscoliosis. Unchanged 4 mm anterolisthesis of L3 on L4. Vertebrae: No evidence of fracture or destructive osseous lesion. Moderate type 1 degenerative endplate changes at W0-9 have mildly increased. The lumbar spinal canal is mildly small in  caliber diffusely on a congenital basis due to short pedicles. Conus medullaris: Extends to the L1-2 level and appears normal. Paraspinal and other soft tissues: 1.8 cm T2 hyperintense left renal lesion, likely a cyst. Postsurgical changes in the posterior paraspinal soft tissues with asymmetric left lower lumbar paraspinal muscle atrophy. Disc levels: Disc desiccation throughout the lumbar spine. Similar appearance of multilevel disc space narrowing, mild at L1-2, mild-to-moderate at L2-3, and severe at L3-4 and L4-5. T10-11: Only imaged sagittally. Mild disc bulging without evidence of compressive stenosis, unchanged. T11-12: Only imaged sagittally. Mild disc bulging without evidence of compressive stenosis, unchanged. T12-L1: Mild disc bulging and  mild facet arthrosis result in borderline spinal and borderline left neural foraminal stenosis, unchanged. L1-2: Mild disc bulging and mild facet arthrosis. Small right subarticular disc protrusion versus artifact. No significant stenosis identified. L2-3: Disc bulging asymmetric to the left, ligamentum flavum thickening, and moderate right and mild left facet hypertrophy result in mild-to-moderate spinal stenosis and mild bilateral neural foraminal stenosis, unchanged. Small right and trace left facet joint effusions. L3-4: Prior right laminectomy. Listhesis with bulging uncovered disc, endplate spurring, left ligamentum flavum thickening, and advanced facet arthrosis result in moderate to severe spinal stenosis and moderate to severe right and moderate left neural foraminal stenosis, unchanged. L4-5: Prior left laminotomy. Disc bulging, left paracentral disc protrusion, endplate spurring, and mild facet arthrosis result in mild-to-moderate spinal stenosis, mild right and moderate left lateral recess stenosis, and moderate right and moderate to severe left neural foraminal stenosis, unchanged. L5-S1: Disc bulging, ligamentum flavum thickening, and advanced facet  arthrosis result in mild spinal stenosis, left greater than right lateral recess stenosis, and moderate right and severe left neural foraminal stenosis, unchanged. Moderate right and small left facet joint effusions. IMPRESSION: 1. Increased degenerative endplate edema at Y1-8. 2. Otherwise unchanged lumbar disc and facet degeneration as detailed above. Moderate to severe spinal stenosis at L3-4. Electronically Signed   By: Logan Bores M.D.   On: 03/30/2016 08:38    Coronary Stent Intervention  Left Heart Cath and Coronary Angiography  Conclusion    Non-ST elevation myocardial infarction with identification of 56-31% segmental mid LAD stenosis and focal eccentric 95-99% mid circumflex stenosis.  The first diagonal is small and contains segmental 80% stenosis. The coronaries are otherwise widely patent.  Successful drug-eluting stent using Onyx Resolute to reduce a 95% mid LAD stenosis to 0% with TIMI grade 3 flow. Final stent diameter 3.0 mm.  Successful drug-eluting stent using Onyx Resolute to reduce a 95% mid circumflex stenosis to 0% with TIMI grade 3 flow. Post stent diameter 3.5 mm. Intervention on this vessel was, complicated by microembolization to the the most distal obtuse marginal beyond a bifurcation.  Mild hypokinesis of the anterior wall. EF 40-50%.       Discharge Exam: Vitals:   04/16/16 0834 04/16/16 1221  BP: (!) 159/63 (!) 102/47  Pulse: 83   Resp: 16 16  Temp: 97 F (36.1 C)    Vitals:   04/16/16 0000 04/16/16 0415 04/16/16 0834 04/16/16 1221  BP: 119/75 113/65 (!) 159/63 (!) 102/47  Pulse: 71 (!) 58 83   Resp: _0 Temp:  97 F (36.1 C) 97 F (36.1 C)   TempSrc:  Axillary Oral   SpO2: 96% 96% 96%   Weight:      Height:        General: Pt is alert, awake, not in acute distress Cardiovascular: RRR, S1/S2 +, no rubs, no gallops Respiratory: CTA bilaterally, no wheezing, no rhonchi Abdominal: Soft, NT, ND, bowel sounds + Extremities: no  edema, no cyanosis    The results of significant diagnostics from this hospitalization (including imaging, microbiology, ancillary and laboratory) are listed below for reference.     Microbiology: No results found for this or any previous visit (from the past 240 hour(s)).   Labs: BNP (last 3 results)  Recent Labs  08/05/15 2043 02/11/16 1212 04/14/16 1251  BNP 57.4 12.7 497.0*   Basic Metabolic Panel:  Recent Labs Lab 04/14/16 1251 04/15/16 0553 04/15/16 1901 04/16/16 0231  NA 138 138  --  138  K  4.5 4.5  --  4.2  CL 104 101  --  103  CO2 25 27  --  23  GLUCOSE 337* 201*  --  109*  BUN 22* 27*  --  30*  CREATININE 1.21* 1.18* 1.10* 1.26*  CALCIUM 9.5 9.5  --  9.1   Liver Function Tests: No results for input(s): AST, ALT, ALKPHOS, BILITOT, PROT, ALBUMIN in the last 168 hours. No results for input(s): LIPASE, AMYLASE in the last 168 hours. No results for input(s): AMMONIA in the last 168 hours. CBC:  Recent Labs Lab 04/14/16 1251 04/15/16 0553 04/15/16 2128 04/16/16 0231  WBC 6.3 7.6 7.0 7.1  HGB 12.7 13.3 12.6 12.5  HCT 39.1 41.2 39.2 39.8  MCV 88.1 89.0 89.3 89.6  PLT 259 245 283 300   Cardiac Enzymes:  Recent Labs Lab 04/14/16 1924 04/15/16 0113 04/15/16 0553 04/15/16 1901  TROPONINI 4.41* 4.87* 5.32* 5.81*   BNP: Invalid input(s): POCBNP CBG:  Recent Labs Lab 04/15/16 1323 04/15/16 1912 04/15/16 2106 04/16/16 0605 04/16/16 1219  GLUCAP 137* 183* 194* 110* 197*   D-Dimer  Recent Labs  04/14/16 1256  DDIMER 0.99*   Hgb A1c  Recent Labs  04/14/16 2026  HGBA1C 10.3*   Lipid Profile No results for input(s): CHOL, HDL, LDLCALC, TRIG, CHOLHDL, LDLDIRECT in the last 72 hours. Thyroid function studies No results for input(s): TSH, T4TOTAL, T3FREE, THYROIDAB in the last 72 hours.  Invalid input(s): FREET3 Anemia work up No results for input(s): VITAMINB12, FOLATE, FERRITIN, TIBC, IRON, RETICCTPCT in the last 72  hours. Urinalysis    Component Value Date/Time   COLORURINE YELLOW 04/14/2015 1505   APPEARANCEUR CLEAR 04/14/2015 1505   LABSPEC 1.008 04/14/2015 1505   PHURINE 5.0 04/14/2015 1505   GLUCOSEU 2+ (A) 04/14/2015 1505   HGBUR NEGATIVE 04/14/2015 1505   BILIRUBINUR NEGATIVE 04/14/2015 1505   KETONESUR NEGATIVE 04/14/2015 1505   PROTEINUR NEGATIVE 04/14/2015 1505   UROBILINOGEN 0.2 08/27/2013 1427   NITRITE NEGATIVE 04/14/2015 1505   LEUKOCYTESUR NEGATIVE 04/14/2015 1505   Sepsis Labs Invalid input(s): PROCALCITONIN,  WBC,  LACTICIDVEN Microbiology No results found for this or any previous visit (from the past 240 hour(s)).   Time coordinating discharge: Over 30 minutes  SIGNED:  Dessa Phi, DO [facility] Pager [phone]  If 7PM-7AM, please contact night-coverage [email or web address] Password [facility] 04/16/2016, 12:44 PM

## 2016-04-16 NOTE — ED Notes (Signed)
MD at the bedside  

## 2016-04-16 NOTE — Progress Notes (Signed)
CARDIAC REHAB PHASE I   PRE:  Rate/Rhythm: 86 SR  BP:  Sitting: 159/63        SaO2: 96 RA  MODE:  Ambulation: 300 ft   POST:  Rate/Rhythm: 113 ST  BP:  Sitting: 183/70         SaO2: 94 RA  Pt ambulated 300 ft on RA, handheld assist, steady gait, tolerated fairly well.  Pt c/o mild DOE, back pain, denies cp, dizziness, declined rest stop. Completed MI/stent education.  Reviewed risk factors, MI book, anti-platelet therapy, stent card, activity restrictions, ntg, exercise, heart healthy diet, carb counting, portion control, and phase 2 cardiac rehab. Pt verbalized understanding. Pt reports high levels of personal stress over the past year, discussed stress management, the possibility of seeing a counselor and following up with PCP, pt receptive.  Pt agrees to phase 2 cardiac rehab referral, will send to [facility] per pt request. Pt to recliner after walk, call bell within reach.   ND:[id number] Lenna Sciara, RN, BSN 04/16/2016 9:43 AM

## 2016-04-16 NOTE — Research (Signed)
Twilight Study Informed Consent   Subject Name: Janet Choi  Subject met inclusion and exclusion criteria.  The informed consent form, study requirements and expectations were reviewed with the subject and questions and concerns were addressed prior to the signing of the consent form.  The subject verbalized understanding of the trail requirements.  The subject agreed to participate in the Twilight trial and signed the informed consent.  The informed consent was obtained prior to performance of any protocol-specific procedures for the subject.  A copy of the signed informed consent was given to the subject and a copy was placed in the subject's medical record.  Sandie Ano 04/16/2016, 7:56

## 2016-04-16 NOTE — Progress Notes (Signed)
Inpatient Diabetes Program Recommendations  AACE/ADA: New Consensus Statement on Inpatient Glycemic Control (2015)  Target Ranges:  Prepandial:   less than 140 mg/dL      Peak postprandial:   less than 180 mg/dL (1-2 hours)      Critically ill patients:  140 - 180 mg/dL   Lab Results  Component Value Date   GLUCAP 197 (H) 04/16/2016   HGBA1C 10.3 (H) 04/14/2016    Review of Glycemic Control  Inpatient Diabetes Program Recommendations:   Spoke with pt about  A1C results 10.3 and explained what an A1C is, basic pathophysiology of DM Type 2, basic home care, basic diabetes diet nutrition principles, importance of checking CBGs and maintaining good CBG control to prevent long-term and short-term complications. Reviewed signs and symptoms of hyperglycemia and hypoglycemia and how to treat hypoglycemia at home. Also reviewed blood sugar goals at home.  Patient states understanding and states her last A1c was higher and improving. Patient acknowledges she needs to limit sweets and breads.  Thank you, Nani Gasser. Dedria Endres, RN, MSN, CDE Inpatient Glycemic Control Team Team Pager [phone] (8am-5pm) 04/16/2016 1:47 PM

## 2016-04-16 NOTE — ED Notes (Signed)
X-ray notified that x-ray changed to portable.

## 2016-04-16 NOTE — Progress Notes (Addendum)
Patient Name: Janet Choi Date of Encounter: 04/16/2016  Primary Cardiologist: Dr. Sande Rives Problem List     Principal Problem:   NSTEMI (non-ST elevated myocardial infarction) [facility]) Active Problems:   Hyperlipidemia   Essential hypertension   Poorly controlled type 2 diabetes mellitus with peripheral neuropathy (HCC)   OSA on CPAP   CKD (chronic kidney disease), stage III   Back pain, subacute   Hypertensive heart disease without heart failure   S/P coronary artery stent placement    Patient Profile     Janet Choi isa 82F with hypertension, hyperlipidemia, and diabetes who presented with NSTEMI.  She underwent PCI of the LAD and LCx.   Subjective   Denies CP. Ambulated with cardiac rehab w/o exertional symptoms. Radial cath site is stable.   Inpatient Medications    Scheduled Meds: . aspirin EC  81 mg Oral Daily  . atorvastatin  80 mg Oral q1800  . FLUoxetine  40 mg Oral Daily  . gabapentin  300 mg Oral QHS  . heparin  5,000 Units Subcutaneous Q8H  . insulin aspart  0-15 Units Subcutaneous TID WC  . insulin aspart  0-5 Units Subcutaneous QHS  . insulin glargine  45 Units Subcutaneous BID  . losartan  25 mg Oral Daily  . sodium chloride flush  3 mL Intravenous Q12H  . ticagrelor  90 mg Oral BID   Continuous Infusions: . nitroGLYCERIN Stopped (04/15/16 2200)   PRN Meds: sodium chloride, acetaminophen, ALPRAZolam, HYDROcodone-acetaminophen, HYDROmorphone (DILAUDID) injection, morphine injection, nitroGLYCERIN, ondansetron (ZOFRAN) IV, ondansetron (ZOFRAN) IV, sodium chloride flush, tiZANidine   Vital Signs    Vitals:   04/15/16 2234 04/16/16 0000 04/16/16 0415 04/16/16 0834  BP: (!) 104/53 119/75 113/65 (!) 159/63  Pulse: 67 71 (!) 58 83  Resp: (!) 22 15 15 16   Temp:   97 F (36.1 C) 97 F (36.1 C)  TempSrc:   Axillary Oral  SpO2: 97% 96% 96% 96%  Weight:      Height:        Intake/Output Summary (Last 24 hours) at 04/16/16 0911 Last  data filed at 04/16/16 0600  Gross per 24 hour  Intake          1594.87 ml  Output             1850 ml  Net          -255.13 ml   Filed Weights   04/14/16 1200 04/14/16 2100 04/15/16 0407  Weight: 228 lb (103.4 kg) 230 lb 8 oz (104.6 kg) 230 lb 8 oz (104.6 kg)    Physical Exam   GEN: Well nourished, well developed, in no acute distress.  HEENT: Grossly normal.  Neck: Supple, no JVD, carotid bruits, or masses. Cardiac: RRR, no murmurs, rubs, or gallops. No clubbing, cyanosis, edema.  Radials/DP/PT 2+ and equal bilaterally.  Respiratory:  Respirations regular and unlabored, clear to auscultation bilaterally. GI: Soft, nontender, nondistended, BS + x 4. MS: no deformity or atrophy. Skin: warm and dry, no rash. Neuro:  Strength and sensation are intact. Psych: AAOx3.  Normal affect.   Labs    CBC  Recent Labs  04/15/16 2128 04/16/16 0231  WBC 7.0 7.1  HGB 12.6 12.5  HCT 39.2 39.8  MCV 89.3 89.6  PLT 283 [id number]   Basic Metabolic Panel  Recent Labs  04/15/16 0553 04/15/16 1901 04/16/16 0231  NA 138  --  138  K 4.5  --  4.2  CL  101  --  103  CO2 27  --  23  GLUCOSE 201*  --  109*  BUN 27*  --  30*  CREATININE 1.18* 1.10* 1.26*  CALCIUM 9.5  --  9.1   Liver Function Tests No results for input(s): AST, ALT, ALKPHOS, BILITOT, PROT, ALBUMIN in the last 72 hours. No results for input(s): LIPASE, AMYLASE in the last 72 hours. Cardiac Enzymes  Recent Labs  04/15/16 0113 04/15/16 0553 04/15/16 1901  TROPONINI 4.87* 5.32* 5.81*   BNP Invalid input(s): POCBNP D-Dimer  Recent Labs  04/14/16 1256  DDIMER 0.99*   Hemoglobin A1C  Recent Labs  04/14/16 2026  HGBA1C 10.3*   Fasting Lipid Panel No results for input(s): CHOL, HDL, LDLCALC, TRIG, CHOLHDL, LDLDIRECT in the last 72 hours. Thyroid Function Tests No results for input(s): TSH, T4TOTAL, T3FREE, THYROIDAB in the last 72 hours.  Invalid input(s): FREET3  Telemetry    NSR currently, nocturnal  bradycardia with rates in the 50s.     Radiology    Dg Chest 2 View  Result Date: 04/14/2016 CLINICAL DATA:  Chest pain and indigestion for 3 weeks, pain worsened today EXAM: CHEST  2 VIEW COMPARISON:  CT chest of 08/05/2015 and chest x-ray of the same day FINDINGS: There are bibasilar opacities present consistent with linear atelectasis or scarring. No pneumonia or effusion is seen. Mediastinal and hilar contours are unremarkable. Cardiomegaly is stable. There are mild degenerative changes in the thoracic spine. IMPRESSION: Bibasilar linear atelectasis or scarring. Stable cardiomegaly. No pneumonia. Electronically Signed   By: Ivar Drape M.D.   On: 04/14/2016 13:26   Ct Angio Chest Pe W Or Wo Contrast  Result Date: 04/14/2016 CLINICAL DATA:  Elevated D-dimer level. Shortness of breath. Lumbar spinal injection yesterday. EXAM: CT ANGIOGRAPHY CHEST WITH CONTRAST TECHNIQUE: Multidetector CT imaging of the chest was performed using the standard protocol during bolus administration of intravenous contrast. Multiplanar CT image reconstructions and MIPs were obtained to evaluate the vascular anatomy. CONTRAST:  100 cc Isovue 370 COMPARISON:  Multiple exams, including 08/05/2015 FINDINGS: Despite efforts by the technologist and patient, motion artifact is present on today's exam and could not be eliminated. This reduces exam sensitivity and specificity. Body habitus reduces diagnostic sensitivity and specificity. Cardiovascular: No filling defect is identified in the pulmonary arterial tree to suggest pulmonary embolus. Reduction in sensitivity for small emboli due to motion and body habitus. Atherosclerotic calcification of the aortic arch and branch vessels. No acute thoracic aortic findings. Mild cardiomegaly primarily of the left heart. Mediastinum/Nodes: Unremarkable Lungs/Pleura: Mild atelectasis along both hemidiaphragms. Upper Abdomen: Unremarkable Musculoskeletal: Suspected fragmented spur along the  right posterior glenoid. Likely chronic given appearance on prior exams. Thoracic spondylosis and degenerative disc disease. Review of the MIP images confirms the above findings. IMPRESSION: 1. No filling defect is identified in the pulmonary arterial tree to suggest pulmonary embolus. Sensitivity for small emboli is reduced due to body habitus and breathing motion artifact. 2. No acute aortic findings or other specific cause for the patient's chest pain. 3. Mild cardiomegaly. 4. Mild atelectasis along both hemidiaphragms. 5. Thoracic spondylosis and degenerative disc disease. Electronically Signed   By: Van Clines M.D.   On: 04/14/2016 15:43    Cardiac Studies   Procedures   Coronary Stent Intervention  Left Heart Cath and Coronary Angiography  Conclusion    Non-ST elevation myocardial infarction with identification of [id number] segmental mid LAD stenosis and focal eccentric 95-99% mid circumflex stenosis.  The first diagonal  is small and contains segmental 80% stenosis. The coronaries are otherwise widely patent.  Successful drug-eluting stent using Onyx Resolute to reduce a 95% mid LAD stenosis to 0% with TIMI grade 3 flow. Final stent diameter 3.0 mm.  Successful drug-eluting stent using Onyx Resolute to reduce a 95% mid circumflex stenosis to 0% with TIMI grade 3 flow. Post stent diameter 3.5 mm. Intervention on this vessel was, complicated by microembolization to the the most distal obtuse marginal beyond a bifurcation.  Mild hypokinesis of the anterior wall. EF 40-50%.      Patient Profile     Ms. Eveland isa 51F with hypertension, hyperlipidemia, and diabetes who presented with NSTEMI.  She underwent PCI of the LAD and LCx.   Assessment & Plan   1. CAD/ NSTEMI: LHC 04/15/16 showed 95-99% segmental mid LAD stenosis and focal eccentric 95-99% mid circumflex stenosis. The first diagonal is small and contains segmental 80% stenosis. The coronaries are otherwise widely patent.  She is s/p LAD and LCx stenting utilizing DES. EF 40-50% by cath (50-55% by echo. She is CP free and ambulating w/o exertional symptoms. She is enrolled in the Twilight Study (ASA + Brilinta), on high dose statin, Lipitor 80 mg, No BB given bradycardia. Losartan for BP and LV dysfunction.   2. HTN: elevated this am in the 180s. Currently on losartan 25 mg. Not on BB given prior issues with bradycardia. Increase Losartan to 50 mg daily.   3. HLD: lipids not checked this admission, but levels have been high historically. Lipid panel 10/2015 showed TGs at 658. LDL could not be calculated. She has had issues in the past with statin intolerance. She is willing to retry and was restarted on Lipitor 80 mg last night. Will reassess tolerance at OP f/u. Can get outpatient lipid panel. If unable to tolerate or if unable to get LDL below goal of < 70 mg/dL, she will need referral to Lipid clinic for PCSK-9 inhibitors.   4. DM: poorly controlled. Hgb A1c 10.3. Will need OP f/u with PCP.   Dispo: possible d/c home later today if BP improves. MD to assess. We will arrange outpatient f/u in our office in 1-2 weeks.   Signed, Lyda Jester, PA-C  04/16/2016, 9:11 AM   History and all data above reviewed.  Patient examined.  I agree with the findings as above.  All available labs, radiology testing, previous records reviewed. Agree with documented assessment and plan. Ms. Haddock  is a 52F with hypertension, hyperlipidemia, and diabetes who presented with NSTEMI.  She underwent PCI of the LAD and LCx.  She is doing well this AM.  BP is poorly-controlled.  We will increase losartan to 50mg .  She will need a BMP at follow up.  She is amenable to trying atorvastatin.  If she fails she will need a PCSK9 inhibitor.   Charitie Hinote C. Oval Linsey, MD, [facility]  04/16/2016 11:18 AM

## 2016-04-16 NOTE — ED Triage Notes (Signed)
Pt arrives via EMS c/o central chest pain. Pt had two stents placed here yesterday and was d/c home. Pt was given 324 Aspirin and 1 Nitro en route. Pt initial pressure was 210/100 and then 158/79 after 1 Nitro. Pt denies any pain at this time. NAD noted. A&Ox4

## 2016-04-16 NOTE — ED Provider Notes (Signed)
[facility] DEPT Provider Note   CSN: [id number] Arrival date & time: 04/16/16  1805     History   Chief Complaint Chief Complaint  Patient presents with  . Chest Pain    HPI Janet Choi is a 75 y.o. female.  HPI  Pt presenting with c/o chest pain.  She was discharged from cardiology service this morning after Nstemi- stents were placed.  She had not been having any chest pain at time of discharge.  This evening after going home- she got up to use the bathroom. When she finished and sat back down she had acute onset of burning sharp midsternal pain.  She did feel short of breath with this.  Some diaphoresis.  No nausea.  She states this felt very different from her original pain with the nstemi.  Pain was relieved by nitro given by EMS.  Currently she is chest pain free.  There are no other associated systemic symptoms, there are no other alleviating or modifying factors.   Past Medical History:  Diagnosis Date  . Anemia    hx (04/14/2016)  . Anxiety   . Arthritis    "severe in my back; hands; ankles" (04/14/2016)  . Basal cell carcinoma    "several burned off; one cut off"  . Chronic lower back pain   . Depression   . DJD (degenerative joint disease)   . Family history of adverse reaction to anesthesia    "half-sister used to get real sick" (04/14/2016)  . GERD (gastroesophageal reflux disease)   . History of gout   . Hyperlipidemia   . Hypertension   . IBS (irritable bowel syndrome)   . 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 ([place]) ~ 2015  . NSTEMI (non-ST elevated myocardial infarction) ([place]) 04/14/2016  . Obesity   . OSA on CPAP   . RLS (restless legs syndrome)   . Type II diabetes mellitus ([place])   . Vitamin D deficiency     Patient Active Problem List   Diagnosis Date Noted  . Chest pain 04/16/2016  . Other chest pain   . Coronary artery disease involving native coronary artery of native heart without angina pectoris   . Hypertensive heart disease without  heart failure   . S/P coronary artery stent placement   . NSTEMI (non-ST elevated myocardial infarction) ([place]) 04/14/2016  . CKD (chronic kidney disease), stage III 04/14/2016  . Back pain, subacute 04/14/2016  . Iron deficiency anemia 08/15/2015  . Hypoxia 08/06/2015  . OSA on CPAP 04/16/2015  . BMI 38.0-38.9,adult 04/14/2015  . Medicare annual wellness visit, subsequent 04/14/2015  . RLS (restless legs syndrome) 03/20/2014  . Poorly controlled type 2 diabetes mellitus with peripheral neuropathy ([place]) 03/20/2014  . Compliance poor 11/28/2013  . Medication management 09/12/2013  . Morbid obesity (BMI 39) 05/14/2013  . Vitamin D deficiency 05/14/2013  . 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 Hyperlipidemia 02/24/2009  . Essential hypertension 02/24/2009  . Diverticulosis of large intestine 02/24/2009  . History of colonic polyps 02/24/2009    Past Surgical History:  Procedure Laterality Date  . APPENDECTOMY    . BACK SURGERY    . BASAL CELL CARCINOMA EXCISION Left    leg  . CARDIAC CATHETERIZATION N/A 04/15/2016   Procedure: Left Heart Cath and Coronary Angiography;  Surgeon: Belva Crome, MD;  Location: [facility];  Service: Cardiovascular;  Laterality: N/A;  . CARDIAC CATHETERIZATION N/A 04/15/2016   Procedure: Coronary Stent Intervention;  Surgeon:  Belva Crome, MD;  Location: [facility];  Service: Cardiovascular;  Laterality: N/A;  Mid LAD Mid CFX  . CATARACT EXTRACTION, BILATERAL Bilateral   . JOINT REPLACEMENT    . LUMBAR DISC SURGERY  X 2  . MELANOMA EXCISION Left    "towards the back of my neck"  . SHOULDER ARTHROSCOPY W/ ROTATOR CUFF REPAIR Left   . SHOULDER OPEN ROTATOR CUFF REPAIR Right   . TOTAL KNEE ARTHROPLASTY Bilateral     OB History    No data available       Home Medications    Prior to Admission medications   Medication Sig Start Date End Date Taking? Authorizing Provider  aspirin EC 81 MG tablet Take 81 mg by mouth every  other day.    Yes Historical Provider, MD  FLECTOR 1.3 % PTCH APPLY 1 PATCH EVERY 12 HOURS AS NEEDED THEN TAKE OFF FOR 12 HOURS 10/03/15  Yes Historical Provider, MD  FLUoxetine (PROZAC) 40 MG capsule TAKE 1 CAPSULE BY MOUTH DAILY. 01/07/16  Yes [name], MD  furosemide (LASIX) 40 MG tablet TAKE 1 TABLET 3 TIMES A DAY BEFORE MEALS FOR EDEMA ( STOP TAKING HCTZ) 02/02/16  Yes [name], MD  gabapentin (NEURONTIN) 600 MG tablet Take 300 mg by mouth at bedtime.   Yes Historical Provider, MD  HYDROcodone-acetaminophen (NORCO/VICODIN) 5-325 MG tablet Take 1 tablet by mouth every 6 (six) hours as needed for moderate pain.  04/08/16  Yes Historical Provider, MD  insulin NPH-regular Human (NOVOLIN 70/30) (70-30) 100 UNIT/ML injection Inject 40-60 Units into the skin See admin instructions. 40 units in the morning and 60 units at bedtime    Yes Historical Provider, MD  losartan (COZAAR) 50 MG tablet Take 1 tablet (50 mg total) by mouth daily. 04/16/16  Yes Jennifer Chahn-Yang Choi, DO  rOPINIRole (REQUIP) 3 MG tablet Take 1 tablet 4 x / day for Restless Legs Patient taking differently: Take 3 mg by mouth 4 (four) times daily. Take 1 tablet 4 x / day for Restless Legs 10/21/15 04/21/16 Yes [name], MD  ticagrelor (BRILINTA) 90 MG TABS tablet Take 1 tablet (90 mg total) by mouth 2 (two) times daily. 04/16/16  Yes Jennifer Chahn-Yang Choi, DO  tiZANidine (ZANAFLEX) 4 MG tablet Take 4 mg by mouth every 6 (six) hours as needed for muscle spasms. MUSCLE SPASM  04/06/16  Yes Historical Provider, MD  atorvastatin (LIPITOR) 80 MG tablet Take 1 tablet (80 mg total) by mouth daily at 6 PM. 04/16/16   Shon Millet, DO  potassium chloride SA (K-DUR,KLOR-CON) 20 MEQ tablet Take 1 tablet (20 mEq total) by mouth 2 (two) times daily. Patient not taking: Reported on 04/16/2016 02/11/16   [name], PA-C    Family History Family History  Problem Relation Age of Onset  . Heart disease Mother     . Kidney disease Father   . Colon cancer Neg Hx   . Colon polyps Neg Hx   . Esophageal cancer Neg Hx   . Pancreatic cancer Neg Hx   . Stomach cancer Neg Hx   . Liver disease Neg Hx   . Diabetes Neg Hx     Social History Social History  Substance Use Topics  . Smoking status: Former Smoker    Packs/day: 2.50    Years: 25.00    Types: Cigarettes    Quit date: 05/11/1983  . Smokeless tobacco: Never Used  . Alcohol use No     Allergies  Lyrica [pregabalin]; Aspirin; Crestor [rosuvastatin]; Lipitor [atorvastatin]; Minocycline hcl; Oruvail [ketoprofen]; Tricor [fenofibrate]; Vasotec [enalaprilat]; Victoza [liraglutide]; and Zinc   Review of Systems Review of Systems  ROS reviewed and all otherwise negative except for mentioned in HPI   Physical Exam Updated Vital Signs BP 128/63 (BP Location: Left Arm)   Pulse 71   Temp 98.4 F (36.9 C) (Oral)   Resp 20   Ht 5\' 3"  (1.6 m)   Wt 104.1 kg   SpO2 93%   BMI 40.65 kg/m  Vitals reviewed Physical Exam Physical Examination: General appearance - alert, well appearing, and in no distress Mental status - alert, oriented to person, place, and time Eyes - no conjunctival injection, no scleral icterus Chest - clear to auscultation, no wheezes, rales or rhonchi, symmetric air entry Heart - normal rate, regular rhythm, normal S1, S2, no murmurs, rubs, clicks or gallops Abdomen - soft, nontender, nondistended, no masses or organomegaly Neurological - alert, oriented, normal speech Extremities - peripheral pulses normal, no pedal edema, no clubbing or cyanosis Skin - normal coloration and turgor, no rashes,- area of prior lovenox injection in abdominal wall is oozing small amount of blood- pressure held and oozing is decreasing  ED Treatments / Results  Labs (all labs ordered are listed, but only abnormal results are displayed) Labs Reviewed  BASIC METABOLIC PANEL - Abnormal; Notable for the following:       Result Value    Glucose, Bld 137 (*)    BUN 30 (*)    Creatinine, Ser 1.29 (*)    GFR calc non Af Amer 39 (*)    GFR calc Af Amer 46 (*)    All other components within normal limits  COMPREHENSIVE METABOLIC PANEL - Abnormal; Notable for the following:    Glucose, Bld 158 (*)    BUN 32 (*)    Creatinine, Ser 1.41 (*)    Calcium 8.8 (*)    Total Protein 6.1 (*)    Albumin 3.3 (*)    GFR calc non Af Amer 35 (*)    GFR calc Af Amer 41 (*)    All other components within normal limits  LIPID PANEL - Abnormal; Notable for the following:    Cholesterol 251 (*)    Triglycerides 341 (*)    HDL 38 (*)    VLDL 68 (*)    LDL Cholesterol 145 (*)    All other components within normal limits  TROPONIN I - Abnormal; Notable for the following:    Troponin I 6.16 (*)    All other components within normal limits  TROPONIN I - Abnormal; Notable for the following:    Troponin I 4.72 (*)    All other components within normal limits  TROPONIN I - Abnormal; Notable for the following:    Troponin I 3.83 (*)    All other components within normal limits  CBC - Abnormal; Notable for the following:    Hemoglobin 11.7 (*)    All other components within normal limits  GLUCOSE, CAPILLARY - Abnormal; Notable for the following:    Glucose-Capillary 103 (*)    All other components within normal limits  HEPARIN LEVEL (UNFRACTIONATED) - Abnormal; Notable for the following:    Heparin Unfractionated 0.21 (*)    All other components within normal limits  HEPARIN LEVEL (UNFRACTIONATED) - Abnormal; Notable for the following:    Heparin Unfractionated <0.10 (*)    All other components within normal limits  GLUCOSE, CAPILLARY - Abnormal; Notable for  the following:    Glucose-Capillary 199 (*)    All other components within normal limits  GLUCOSE, CAPILLARY - Abnormal; Notable for the following:    Glucose-Capillary 168 (*)    All other components within normal limits  I-STAT TROPOININ, ED - Abnormal; Notable for the  following:    Troponin i, poc 6.34 (*)    All other components within normal limits  I-STAT CHEM 8, ED - Abnormal; Notable for the following:    Potassium 3.4 (*)    Glucose, Bld 134 (*)    Calcium, Ion 0.95 (*)    All other components within normal limits  CBC  PROTIME-INR  PROTIME-INR  BASIC METABOLIC PANEL    EKG  EKG Interpretation  Date/Time:  Friday April 16 2016 18:12:37 EST Ventricular Rate:  74 PR Interval:    QRS Duration: 94 QT Interval:  396 QTC Calculation: 440 R Axis:   -52 Text Interpretation:  Sinus rhythm Left anterior fascicular block Anteroseptal infarct, age indeterminate No significant change since last tracing Confirmed by Canary Brim  MD, Quinntin Malter [phone]) on 04/16/2016 6:22:00 PM Also confirmed by Canary Brim  MD, [place] [phone]), editor [place], Joelene Millin [phone])  on 04/17/2016 10:57:33 AM       Radiology Dg Chest Portable 1 View  Result Date: 04/16/2016 CLINICAL DATA:  Chest pain. EXAM: PORTABLE CHEST 1 VIEW COMPARISON:  Radiographs and CT 2 days prior 04/14/2016 FINDINGS: Stable cardiomegaly and mediastinal contours. Unchanged left basal atelectasis. Exam is otherwise unchanged. No new focal airspace disease, pleural fluid, pulmonary edema or pneumothorax. Unchanged osseous structures. IMPRESSION: Stable cardiomegaly without acute abnormality. Electronically Signed   By: Jeb Levering M.D.   On: 04/16/2016 20:29    Procedures Procedures (including critical care time)  Medications Ordered in ED Medications  atorvastatin (LIPITOR) tablet 80 mg (80 mg Oral Given 04/17/16 1658)  gabapentin (NEURONTIN) capsule 300 mg (300 mg Oral Given 04/17/16 0018)  HYDROcodone-acetaminophen (NORCO/VICODIN) 5-325 MG per tablet 1 tablet (1 tablet Oral Given 04/17/16 0826)  losartan (COZAAR) tablet 50 mg (50 mg Oral Given 04/17/16 0826)  ticagrelor (BRILINTA) tablet 90 mg (90 mg Oral Given 04/17/16 0827)  tiZANidine (ZANAFLEX) tablet 4 mg (4 mg Oral Given 04/17/16 0827)  FLUoxetine  (PROZAC) capsule 40 mg (40 mg Oral Given 04/17/16 0826)  aspirin EC tablet 81 mg (81 mg Oral Not Given 04/17/16 0832)  rOPINIRole (REQUIP) tablet 3 mg (3 mg Oral Given 04/17/16 1632)  sodium chloride flush (NS) 0.9 % injection 3 mL (3 mLs Intravenous Given 04/17/16 0832)  sodium chloride flush (NS) 0.9 % injection 3 mL (not administered)  0.9 %  sodium chloride infusion (not administered)  nitroGLYCERIN (NITROSTAT) SL tablet 0.4 mg (not administered)  acetaminophen (TYLENOL) tablet 650 mg (not administered)  ondansetron (ZOFRAN) injection 4 mg (not administered)  insulin aspart protamine- aspart (NOVOLOG MIX 70/30) injection 20 Units (20 Units Subcutaneous Given 04/17/16 1632)  insulin aspart (novoLOG) injection 0-15 Units (3 Units Subcutaneous Given 04/17/16 1633)     Initial Impression / Assessment and Plan / ED Course  I have reviewed the triage vital signs and the nursing notes.  Pertinent labs & imaging results that were available during my care of the patient were reviewed by me and considered in my medical decision making (see chart for details).  Clinical Course   8:03 PM d/w cardiology- they are planning to admit patient and are going to write the admission orders.    Pt presenting with chest pain after placement of stent and  discharge earlier today.  Chest pain is sharp and not similar to pain prior to stent.  However troponin is elevated- cardiology consulted- they are going to admit patient and cycle enzymes.  She is chest pain free in the ED after nitroglycerin via EMS.    Final Clinical Impressions(s) / ED Diagnoses   Final diagnoses:  Chest pain, unspecified type  Elevated troponin    New Prescriptions Current Discharge Medication List       Alfonzo Beers, MD 04/17/16 2336

## 2016-04-16 NOTE — Consult Note (Signed)
[facility] CM Primary Care Navigator  04/16/2016  Janet Choi [birth year] [id number]   Met with patient at the bedside to identify possible discharge needs. Patient reports severe back pain interrupting sleep, chest heaviness/ discomfort and headache are reasons that had led to this admission.  Patient endorses Dr. [name] with [facility] as the primary care provider.    Patient shared using [facility] in [place] to obtain medications without any problem.   Patient states managing her own medications at home using "pill box" system.   She reports being able to drive prior to admission. Her sister Janet Choi) will be able to provide transportation to her doctors' appointments after discharge.  She is very independent with self care as stated and she is the primary caregiver for herself at home but she has a border Acupuncturist) who lives with her who can assist with care if needed. Her son Janet Choi) also lives close by and will be able to help with care needs as needed.   Discharge plan is home with patient. She states having plenty of help or support when she gets home.  Patient voiced understanding to call primary care provider's office when returns home, for a post discharge follow-up appointment within a week or sooner if needs arise. Patient letter provided for assistance. Patient mentioned having an appointment to see PCP on 04/26/16 and a scheduled appointment to see a registered dietitian for DM that was set up by endocrinologist. Encouraged patient's attendance.  Patient denies current issues or concerns as well as denies any needs for disease management/ education at this time. [facility] care management contact information provided if future needs arise.  For questions, please contact:  Dannielle Huh, BSN, RN- [facility] Primary Care Navigator  Telephone: [phone] [place]

## 2016-04-16 NOTE — Care Management Note (Signed)
Case Management Note  Patient Details  Name: Janet Choi MRN: [id number] Date of Birth: 1940/09/02  Subjective/Objective:     S/p coronary stent, will be participating in twilight study for brilinta, she is indep pta, she states she has a live in border at her house and she still works 16 hrs per week part time.  She is for dc today, no other needs.                Action/Plan:   Expected Discharge Date:                  Expected Discharge Plan:  Home/Self Care  In-House Referral:     Discharge planning Services  CM Consult  Post Acute Care Choice:    Choice offered to:     DME Arranged:    DME Agency:     HH Arranged:    HH Agency:     Status of Service:  Completed, signed off  If discussed at [employer] of Stay Meetings, dates discussed:    Additional Comments:  Zenon Mayo, RN 04/16/2016, 10:38 AM

## 2016-04-17 ENCOUNTER — Encounter (HOSPITAL_COMMUNITY): Payer: Self-pay | Admitting: *Deleted

## 2016-04-17 DIAGNOSIS — I214 Non-ST elevation (NSTEMI) myocardial infarction: Secondary | ICD-10-CM | POA: Diagnosis not present

## 2016-04-17 DIAGNOSIS — G4733 Obstructive sleep apnea (adult) (pediatric): Secondary | ICD-10-CM | POA: Diagnosis not present

## 2016-04-17 DIAGNOSIS — Z7982 Long term (current) use of aspirin: Secondary | ICD-10-CM | POA: Diagnosis not present

## 2016-04-17 DIAGNOSIS — Z8249 Family history of ischemic heart disease and other diseases of the circulatory system: Secondary | ICD-10-CM | POA: Diagnosis not present

## 2016-04-17 DIAGNOSIS — Z794 Long term (current) use of insulin: Secondary | ICD-10-CM | POA: Diagnosis not present

## 2016-04-17 DIAGNOSIS — R072 Precordial pain: Secondary | ICD-10-CM | POA: Diagnosis not present

## 2016-04-17 DIAGNOSIS — F419 Anxiety disorder, unspecified: Secondary | ICD-10-CM | POA: Diagnosis not present

## 2016-04-17 DIAGNOSIS — Z87891 Personal history of nicotine dependence: Secondary | ICD-10-CM | POA: Diagnosis not present

## 2016-04-17 DIAGNOSIS — F411 Generalized anxiety disorder: Secondary | ICD-10-CM | POA: Diagnosis not present

## 2016-04-17 DIAGNOSIS — R079 Chest pain, unspecified: Secondary | ICD-10-CM | POA: Diagnosis not present

## 2016-04-17 DIAGNOSIS — E1142 Type 2 diabetes mellitus with diabetic polyneuropathy: Secondary | ICD-10-CM | POA: Diagnosis not present

## 2016-04-17 DIAGNOSIS — M48061 Spinal stenosis, lumbar region without neurogenic claudication: Secondary | ICD-10-CM | POA: Diagnosis not present

## 2016-04-17 DIAGNOSIS — R748 Abnormal levels of other serum enzymes: Secondary | ICD-10-CM | POA: Diagnosis not present

## 2016-04-17 DIAGNOSIS — E785 Hyperlipidemia, unspecified: Secondary | ICD-10-CM | POA: Diagnosis not present

## 2016-04-17 DIAGNOSIS — Z85820 Personal history of malignant melanoma of skin: Secondary | ICD-10-CM | POA: Diagnosis not present

## 2016-04-17 DIAGNOSIS — I129 Hypertensive chronic kidney disease with stage 1 through stage 4 chronic kidney disease, or unspecified chronic kidney disease: Secondary | ICD-10-CM | POA: Diagnosis not present

## 2016-04-17 DIAGNOSIS — T45525A Adverse effect of antithrombotic drugs, initial encounter: Secondary | ICD-10-CM | POA: Diagnosis not present

## 2016-04-17 DIAGNOSIS — N183 Chronic kidney disease, stage 3 (moderate): Secondary | ICD-10-CM | POA: Diagnosis not present

## 2016-04-17 DIAGNOSIS — R0602 Shortness of breath: Secondary | ICD-10-CM | POA: Diagnosis not present

## 2016-04-17 DIAGNOSIS — E1122 Type 2 diabetes mellitus with diabetic chronic kidney disease: Secondary | ICD-10-CM | POA: Diagnosis not present

## 2016-04-17 DIAGNOSIS — I10 Essential (primary) hypertension: Secondary | ICD-10-CM | POA: Diagnosis not present

## 2016-04-17 DIAGNOSIS — Z888 Allergy status to other drugs, medicaments and biological substances status: Secondary | ICD-10-CM | POA: Diagnosis not present

## 2016-04-17 DIAGNOSIS — D649 Anemia, unspecified: Secondary | ICD-10-CM | POA: Diagnosis not present

## 2016-04-17 DIAGNOSIS — Z955 Presence of coronary angioplasty implant and graft: Secondary | ICD-10-CM | POA: Diagnosis not present

## 2016-04-17 DIAGNOSIS — E1165 Type 2 diabetes mellitus with hyperglycemia: Secondary | ICD-10-CM | POA: Diagnosis not present

## 2016-04-17 DIAGNOSIS — Z79899 Other long term (current) drug therapy: Secondary | ICD-10-CM | POA: Diagnosis not present

## 2016-04-17 DIAGNOSIS — Z96653 Presence of artificial knee joint, bilateral: Secondary | ICD-10-CM | POA: Diagnosis not present

## 2016-04-17 DIAGNOSIS — Z841 Family history of disorders of kidney and ureter: Secondary | ICD-10-CM | POA: Diagnosis not present

## 2016-04-17 DIAGNOSIS — I2510 Atherosclerotic heart disease of native coronary artery without angina pectoris: Secondary | ICD-10-CM | POA: Diagnosis not present

## 2016-04-17 DIAGNOSIS — R0789 Other chest pain: Secondary | ICD-10-CM | POA: Diagnosis not present

## 2016-04-17 DIAGNOSIS — Z6841 Body Mass Index (BMI) 40.0 and over, adult: Secondary | ICD-10-CM | POA: Diagnosis not present

## 2016-04-17 LAB — COMPREHENSIVE METABOLIC PANEL
ALBUMIN: 3.3 g/dL — AB (ref 3.5–5.0)
ALT: 15 U/L (ref 14–54)
AST: 25 U/L (ref 15–41)
Alkaline Phosphatase: 65 U/L (ref 38–126)
Anion gap: 12 (ref 5–15)
BILIRUBIN TOTAL: 0.5 mg/dL (ref 0.3–1.2)
BUN: 32 mg/dL — AB (ref 6–20)
CHLORIDE: 103 mmol/L (ref 101–111)
CO2: 24 mmol/L (ref 22–32)
CREATININE: 1.41 mg/dL — AB (ref 0.44–1.00)
Calcium: 8.8 mg/dL — ABNORMAL LOW (ref 8.9–10.3)
GFR calc Af Amer: 41 mL/min — ABNORMAL LOW (ref >60)
GFR calc non Af Amer: 35 mL/min — ABNORMAL LOW (ref >60)
GLUCOSE: 158 mg/dL — AB (ref 65–99)
POTASSIUM: 4 mmol/L (ref 3.5–5.1)
Sodium: 139 mmol/L (ref 135–145)
Total Protein: 6.1 g/dL — ABNORMAL LOW (ref 6.5–8.1)

## 2016-04-17 LAB — TROPONIN I
TROPONIN I: 6.16 ng/mL — AB (ref <0.03)
Troponin I: 4.72 ng/mL (ref <0.03)
Troponin I: 3.83 ng/mL (ref <0.03)

## 2016-04-17 LAB — CBC
HEMATOCRIT: 37.1 % (ref 36.0–46.0)
Hemoglobin: 11.7 g/dL — ABNORMAL LOW (ref 12.0–15.0)
MCH: 28.3 pg (ref 26.0–34.0)
MCHC: 31.5 g/dL (ref 30.0–36.0)
MCV: 89.6 fL (ref 78.0–100.0)
Platelets: 262 10*3/uL (ref 150–400)
RBC: 4.14 MIL/uL (ref 3.87–5.11)
RDW: 13.2 % (ref 11.5–15.5)
WBC: 5.7 10*3/uL (ref 4.0–10.5)

## 2016-04-17 LAB — LIPID PANEL
Cholesterol: 251 mg/dL — ABNORMAL HIGH (ref 0–200)
HDL: 38 mg/dL — AB (ref >40)
LDL CALC: 145 mg/dL — AB (ref 0–99)
TRIGLYCERIDES: 341 mg/dL — AB (ref <150)
Total CHOL/HDL Ratio: 6.6 RATIO
VLDL: 68 mg/dL — AB (ref 0–40)

## 2016-04-17 LAB — HEPARIN LEVEL (UNFRACTIONATED)
Heparin Unfractionated: 0.21 IU/mL — ABNORMAL LOW (ref 0.30–0.70)
Heparin Unfractionated: <0.1 IU/mL — ABNORMAL LOW (ref 0.30–0.70)

## 2016-04-17 LAB — GLUCOSE, CAPILLARY
GLUCOSE-CAPILLARY: 199 mg/dL — AB (ref 65–99)
Glucose-Capillary: 168 mg/dL — ABNORMAL HIGH (ref 65–99)

## 2016-04-17 LAB — PROTIME-INR
INR: 1.04
INR: 1.17
Prothrombin Time: 13.6 seconds (ref 11.4–15.2)
Prothrombin Time: 15 seconds (ref 11.4–15.2)

## 2016-04-17 MED ORDER — INSULIN ASPART PROT & ASPART (70-30 MIX) 100 UNIT/ML ~~LOC~~ SUSP
20.0000 [IU] | Freq: Two times a day (BID) | SUBCUTANEOUS | Status: DC
  Administered 2016-04-17 – 2016-04-18 (×3): 20 [IU] via SUBCUTANEOUS
  Filled 2016-04-17: qty 10

## 2016-04-17 MED ORDER — INSULIN ASPART 100 UNIT/ML ~~LOC~~ SOLN
0.0000 [IU] | Freq: Three times a day (TID) | SUBCUTANEOUS | Status: DC
  Administered 2016-04-17 – 2016-04-19 (×4): 3 [IU] via SUBCUTANEOUS
  Administered 2016-04-19: 8 [IU] via SUBCUTANEOUS
  Administered 2016-04-19: 2 [IU] via SUBCUTANEOUS
  Administered 2016-04-20: 5 [IU] via SUBCUTANEOUS
  Administered 2016-04-20: 3 [IU] via SUBCUTANEOUS

## 2016-04-17 NOTE — Progress Notes (Deleted)
   Readmitted with CP post NSTEMI and double vessel PCI / Stent.  Pain on readmission is different than NSTEMI pain and PCI related symptoms.  There was circumflex OM3 distal thrombus emboli during PCI. Cardiac markers were not tracked post MI, but I agree with Dr. Harl Bowie that she will likely have a secondary enzyme bump, as we are seeing.  Agree with conservative management and consideration of pericarditis or other etiology for chest pain. Very small infarcts with severe BP elevation can be associated with infarct expansion(pre-rupture). I would not discharge until BP is well controlled.

## 2016-04-17 NOTE — Progress Notes (Signed)
CRITICAL VALUE ALERT  Critical value received:  Troponin 6.16  Date of notification:  04/17/16  Time of notification:  [id number]  Critical value read back:Yes.    Nurse who received alert:  Kendrick Ranch  MD notified (1st page):  Dr. Kenton Kingfisher  Time of first page:  0155  MD notified (2nd page):  Time of second page:  Responding MD:  awaiting  Time MD responded:  awaiting

## 2016-04-17 NOTE — Progress Notes (Signed)
   Readmitted with CP post NSTEMI and double vessel PCI / Stent.  Pain on readmission is different than NSTEMI pain and PCI related symptoms.  There was circumflex OM3 distal thrombus emboli during PCI. Cardiac markers were not tracked post MI, but I agree with Dr. Harl Bowie that she will likely have a secondary enzyme bump, as we are seeing.  Agree with conservative management and consideration of pericarditis or other etiology for chest pain. Very small infarcts with severe BP elevation can be associated with infarct expansion(pre-rupture). I would not discharge until BP is well controlled.

## 2016-04-17 NOTE — Progress Notes (Signed)
[place] for Heparin  Indication: chest pain/ACS  Allergies  Allergen Reactions  . Lyrica [Pregabalin] Other (See Comments)    Blurred vision  . Aspirin Other (See Comments)    Large doses per patient  . Crestor [Rosuvastatin] Other (See Comments)    Leg cramps  . Lipitor [Atorvastatin] Other (See Comments)    Leg cramps  . Minocycline Hcl Other (See Comments)    REACTION: Reaction not known  . Oruvail [Ketoprofen] Other (See Comments)  . Tricor [Fenofibrate] Other (See Comments)  . Vasotec [Enalaprilat] Other (See Comments)  . Victoza [Liraglutide] Other (See Comments)  . Zinc Other (See Comments)    Patient Measurements: Height: 5\' 3"  (160 cm) Weight: 229 lb 8 oz (104.1 kg) IBW/kg (Calculated) : 52.4 Vital Signs: Temp: 98.1 F (36.7 C) (12/09 0445) Temp Source: Oral (12/09 0445) BP: 90/51 (12/09 0445) Pulse Rate: 62 (12/09 0445)  Labs:  Recent Labs  04/15/16 0113 04/15/16 0553 04/15/16 0940 04/15/16 1901  04/16/16 0231 04/16/16 1813 04/16/16 1843 04/16/16 2358 04/17/16 0440 04/17/16 0826  HGB  --  13.3  --   --   < > 12.5 13.1 12.6  --  11.7*  --   HCT  --  41.2  --   --   < > 39.8 40.2 37.0  --  37.1  --   PLT  --  245  --   --   < > 300 281  --   --  262  --   LABPROT  --  13.8  --   --   --   --   --   --  13.6 15.0  --   INR  --  1.06  --   --   --   --   --   --  1.04 1.17  --   HEPARINUNFRC 0.13*  --  0.25*  --   --   --   --   --   --   --  0.21*  CREATININE  --  1.18*  --  1.10*  --  1.26* 1.29* 0.70  --  1.41*  --   TROPONINI 4.87* 5.32*  --  5.81*  --   --   --   --  6.16* 4.72*  --   < > = values in this interval not displayed.  Estimated Creatinine Clearance: 39.8 mL/min (by C-G formula based on SCr of 1.41 mg/dL (H)).    Assessment: Recent NSTEMI with stent placement 12/7, now with new onset of chest pain, troponin elevated, starting heparin drip, CBC good, renal function good, PTA meds reviewed.  AM  heparin level = 0.21, CBC stable  Goal of Therapy:  Heparin level 0.3-0.7 units/ml Monitor platelets by anticoagulation protocol: Yes   Plan:  Heparin to 1400 units / hr 6 hour heparin level  Thank you Anette Guarneri, PharmD [phone] 04/17/2016,10:28 AM

## 2016-04-17 NOTE — Progress Notes (Signed)
Patient Name: Janet Choi Date of Encounter: 04/17/2016  Primary Cardiologist: Dr. [facility] Problem List     Active Problems:   Chest pain     Subjective   No further chest pain.  She does have dyspnea with her Brilinta.   Inpatient Medications    Scheduled Meds: . aspirin EC  81 mg Oral Daily  . atorvastatin  80 mg Oral q1800  . FLUoxetine  40 mg Oral Daily  . gabapentin  300 mg Oral QHS  . losartan  50 mg Oral Daily  . rOPINIRole  3 mg Oral TID AC & HS  . sodium chloride flush  3 mL Intravenous Q12H  . ticagrelor  90 mg Oral BID   Continuous Infusions: . heparin 1,200 Units/hr (04/17/16 0019)   PRN Meds: sodium chloride, acetaminophen, HYDROcodone-acetaminophen, nitroGLYCERIN, ondansetron (ZOFRAN) IV, sodium chloride flush, tiZANidine   Vital Signs    Vitals:   04/16/16 2222 04/16/16 2243 04/17/16 0124 04/17/16 0445  BP: 131/80 (!) 136/58 (!) 92/43 (!) 90/51  Pulse: 72 78 81 62  Resp: 18 18 18 18   Temp:  98.3 F (36.8 C) 98.6 F (37 C) 98.1 F (36.7 C)  TempSrc:  Oral Oral Oral  SpO2: 100% 97% 95% 93%  Weight:  228 lb 8 oz (103.6 kg)  229 lb 8 oz (104.1 kg)  Height:  5\' 3"  (1.6 m)      Intake/Output Summary (Last 24 hours) at 04/17/16 1145 Last data filed at 04/17/16 0600  Gross per 24 hour  Intake             68.2 ml  Output              400 ml  Net           -331.8 ml   Filed Weights   04/16/16 1810 04/16/16 2243 04/17/16 0445  Weight: 230 lb (104.3 kg) 228 lb 8 oz (103.6 kg) 229 lb 8 oz (104.1 kg)    Physical Exam    GEN: NAD.  Neck:  No JVD Cardiac: Regular Rate and Rhythm, no murmurs, rubs, or gallops.  No edema.  Radials/DP/PT 2+  and equal bilaterally.  Respiratory:  Respirations  regular and unlabored, clear to auscultation bilaterally. GI: Soft, nontender, nondistended, BS + x 4. Skin: warm and dry, no rash. Neuro:   Strength and sensation are intact. Psych:  AAOx3.  Normal affect.  Labs    CBC  Recent Labs  04/16/16 1813 04/16/16 1843 04/17/16 0440  WBC 5.8  --  5.7  HGB 13.1 12.6 11.7*  HCT 40.2 37.0 37.1  MCV 89.5  --  89.6  PLT 281  --  99991111   Basic Metabolic Panel  Recent Labs  04/16/16 1813 04/16/16 1843 04/17/16 0440  NA 137 135 139  K 3.9 3.4* 4.0  CL 101 102 103  CO2 25  --  24  GLUCOSE 137* 134* 158*  BUN 30* 14 32*  CREATININE 1.29* 0.70 1.41*  CALCIUM 9.2  --  8.8*   Liver Function Tests  Recent Labs  04/17/16 0440  AST 25  ALT 15  ALKPHOS 65  BILITOT 0.5  PROT 6.1*  ALBUMIN 3.3*   No results for input(s): LIPASE, AMYLASE in the last 72 hours. Cardiac Enzymes  Recent Labs  04/15/16 1901 04/16/16 2358 04/17/16 0440  TROPONINI 5.81* 6.16* 4.72*   BNP Invalid input(s): POCBNP D-Dimer  Recent Labs  04/14/16 1256  DDIMER 0.99*  Hemoglobin A1C  Recent Labs  04/14/16 2026  HGBA1C 10.3*   Fasting Lipid Panel  Recent Labs  04/17/16 0440  CHOL 251*  HDL 38*  LDLCALC 145*  TRIG 341*  CHOLHDL 6.6   Thyroid Function Tests No results for input(s): TSH, T4TOTAL, T3FREE, THYROIDAB in the last 72 hours.  Invalid input(s): FREET3  Telemetry    NSR - Personally Reviewed  ECG    NSR, RAD suggests limb lead reversal.  Diffuse T wave inversions unchanged from previous EKG. 04/17/2016 - Personally Reviewed  Radiology    Dg Chest Portable 1 View  Result Date: 04/16/2016 CLINICAL DATA:  Chest pain. EXAM: PORTABLE CHEST 1 VIEW COMPARISON:  Radiographs and CT 2 days prior 04/14/2016 FINDINGS: Stable cardiomegaly and mediastinal contours. Unchanged left basal atelectasis. Exam is otherwise unchanged. No new focal airspace disease, pleural fluid, pulmonary edema or pneumothorax. Unchanged osseous structures. IMPRESSION: Stable cardiomegaly without acute abnormality. Electronically Signed   By: Jeb Levering M.D.   On: 04/16/2016 20:29    Cardiac Studies   None  Patient Profile     KALLEY THATCH is a 75 y.o. female with a history of  anemia, diabetes, HTN, hyperlipidemia and recently diagnosed CAD with NSTEMI s/p DES to LAD and LCx (discharged 12/8) who presented back to [facility] ED for chest pain.   Assessment & Plan    CHEST PAIN:    Enzymes are trending down.  EKG had lead reversal.  I will repeat this.  Stop heparin.  Ambulate.  If no further pain I will likely discharge in the AM.   HTN:     BP is fluctuating.  Follow.    ELEVATED CREAT:  Repeat BMET in AM.     Signed, Minus Breeding, MD  04/17/2016, 11:45 AM

## 2016-04-18 DIAGNOSIS — R0602 Shortness of breath: Secondary | ICD-10-CM

## 2016-04-18 DIAGNOSIS — R079 Chest pain, unspecified: Secondary | ICD-10-CM

## 2016-04-18 LAB — BRAIN NATRIURETIC PEPTIDE: B Natriuretic Peptide: 463.5 pg/mL — ABNORMAL HIGH (ref 0.0–100.0)

## 2016-04-18 LAB — BASIC METABOLIC PANEL
ANION GAP: 10 (ref 5–15)
BUN: 28 mg/dL — ABNORMAL HIGH (ref 6–20)
CHLORIDE: 103 mmol/L (ref 101–111)
CO2: 23 mmol/L (ref 22–32)
Calcium: 8.9 mg/dL (ref 8.9–10.3)
Creatinine, Ser: 1.24 mg/dL — ABNORMAL HIGH (ref 0.44–1.00)
GFR calc non Af Amer: 41 mL/min — ABNORMAL LOW (ref >60)
GFR, EST AFRICAN AMERICAN: 48 mL/min — AB (ref >60)
Glucose, Bld: 189 mg/dL — ABNORMAL HIGH (ref 65–99)
POTASSIUM: 4.5 mmol/L (ref 3.5–5.1)
SODIUM: 136 mmol/L (ref 135–145)

## 2016-04-18 LAB — GLUCOSE, CAPILLARY
GLUCOSE-CAPILLARY: 156 mg/dL — AB (ref 65–99)
GLUCOSE-CAPILLARY: 110 mg/dL — AB (ref 65–99)
Glucose-Capillary: 184 mg/dL — ABNORMAL HIGH (ref 65–99)
Glucose-Capillary: 164 mg/dL — ABNORMAL HIGH (ref 65–99)

## 2016-04-18 MED ORDER — ALPRAZOLAM 0.25 MG PO TABS
0.2500 mg | ORAL_TABLET | Freq: Three times a day (TID) | ORAL | Status: DC | PRN
  Administered 2016-04-18 – 2016-04-19 (×3): 0.25 mg via ORAL
  Filled 2016-04-18 (×4): qty 1

## 2016-04-18 MED ORDER — INSULIN ASPART PROT & ASPART (70-30 MIX) 100 UNIT/ML ~~LOC~~ SUSP
20.0000 [IU] | Freq: Two times a day (BID) | SUBCUTANEOUS | Status: DC
  Administered 2016-04-19 – 2016-04-20 (×3): 20 [IU] via SUBCUTANEOUS
  Filled 2016-04-18 (×2): qty 10

## 2016-04-18 NOTE — Progress Notes (Signed)
Patient Name: Janet Choi Date of Encounter: 04/18/2016  Primary Cardiologist: Dr. [facility] Problem List     Active Problems:   Chest pain     Subjective   No further chest pain.  She does have dyspnea with her Brilinta but seems to improve with caffeine.  Having episodes of SOB that are no time related to taking her Brilinta and improve with oxygen.  She thinks it is anxiety.  Has a lot of buttocks pain and sciatic from spinal stenosis and thinks this is aggravating her SOB  Inpatient Medications    Scheduled Meds: . aspirin EC  81 mg Oral Daily  . atorvastatin  80 mg Oral q1800  . FLUoxetine  40 mg Oral Daily  . gabapentin  300 mg Oral QHS  . insulin aspart  0-15 Units Subcutaneous TID WC  . insulin aspart protamine- aspart  20 Units Subcutaneous BID WC  . losartan  50 mg Oral Daily  . rOPINIRole  3 mg Oral TID AC & HS  . sodium chloride flush  3 mL Intravenous Q12H  . ticagrelor  90 mg Oral BID   Continuous Infusions:  PRN Meds: sodium chloride, acetaminophen, HYDROcodone-acetaminophen, nitroGLYCERIN, ondansetron (ZOFRAN) IV, sodium chloride flush, tiZANidine   Vital Signs    Vitals:   04/17/16 0445 04/17/16 1255 04/17/16 2003 04/18/16 0419  BP: (!) 90/51 (!) 95/49 128/63 115/62  Pulse: 62 (!) 59 71 61  Resp: 18 20 20 20   Temp: 98.1 F (36.7 C) 98 F (36.7 C) 98.4 F (36.9 C) 97.8 F (36.6 C)  TempSrc: Oral Oral Oral Oral  SpO2: 93% 99% 93% 99%  Weight: 229 lb 8 oz (104.1 kg)   228 lb 3.2 oz (103.5 kg)  Height:        Intake/Output Summary (Last 24 hours) at 04/18/16 1113 Last data filed at 04/18/16 0830  Gross per 24 hour  Intake              420 ml  Output              900 ml  Net             -480 ml   Filed Weights   04/16/16 2243 04/17/16 0445 04/18/16 0419  Weight: 228 lb 8 oz (103.6 kg) 229 lb 8 oz (104.1 kg) 228 lb 3.2 oz (103.5 kg)    Physical Exam    GEN: NAD.  Neck:  No JVD Cardiac: Regular Rate and Rhythm, no murmurs,  rubs, or gallops.  No edema.  Radials/DP/PT 2+  and equal bilaterally.  Respiratory:  Respirations  regular and unlabored, clear to auscultation bilaterally. GI: Soft, nontender, nondistended, BS + x 4. Skin: warm and dry, no rash. Neuro:   Strength and sensation are intact. Psych:  AAOx3.  Normal affect.  Labs    CBC  Recent Labs  04/16/16 1813 04/16/16 1843 04/17/16 0440  WBC 5.8  --  5.7  HGB 13.1 12.6 11.7*  HCT 40.2 37.0 37.1  MCV 89.5  --  89.6  PLT 281  --  99991111   Basic Metabolic Panel  Recent Labs  04/17/16 0440 04/18/16 0212  NA 139 136  K 4.0 4.5  CL 103 103  CO2 24 23  GLUCOSE 158* 189*  BUN 32* 28*  CREATININE 1.41* 1.24*  CALCIUM 8.8* 8.9   Liver Function Tests  Recent Labs  04/17/16 0440  AST 25  ALT 15  ALKPHOS 65  BILITOT  0.5  PROT 6.1*  ALBUMIN 3.3*   No results for input(s): LIPASE, AMYLASE in the last 72 hours. Cardiac Enzymes  Recent Labs  04/16/16 2358 04/17/16 0440 04/17/16 1008  TROPONINI 6.16* 4.72* 3.83*   BNP Invalid input(s): POCBNP D-Dimer No results for input(s): DDIMER in the last 72 hours. Hemoglobin A1C No results for input(s): HGBA1C in the last 72 hours. Fasting Lipid Panel  Recent Labs  04/17/16 0440  CHOL 251*  HDL 38*  LDLCALC 145*  TRIG 341*  CHOLHDL 6.6   Thyroid Function Tests No results for input(s): TSH, T4TOTAL, T3FREE, THYROIDAB in the last 72 hours.  Invalid input(s): FREET3  Telemetry    NSR - Personally Reviewed  ECG    NSR, RAD suggests limb lead reversal.  Diffuse T wave inversions unchanged from previous EKG. 04/18/2016 - Personally Reviewed  Radiology    Dg Chest Portable 1 View  Result Date: 04/16/2016 CLINICAL DATA:  Chest pain. EXAM: PORTABLE CHEST 1 VIEW COMPARISON:  Radiographs and CT 2 days prior 04/14/2016 FINDINGS: Stable cardiomegaly and mediastinal contours. Unchanged left basal atelectasis. Exam is otherwise unchanged. No new focal airspace disease, pleural fluid,  pulmonary edema or pneumothorax. Unchanged osseous structures. IMPRESSION: Stable cardiomegaly without acute abnormality. Electronically Signed   By: Jeb Levering M.D.   On: 04/16/2016 20:29     Cardiac Studies   None  Patient Profile     Janet Choi is a 75 y.o. female with a history of anemia, diabetes, HTN, hyperlipidemia and recently diagnosed CAD with NSTEMI s/p DES to LAD and LCx (discharged 12/8) who presented back to [facility] ED for chest pain.   Assessment & Plan    CHEST PAIN:    Enzymes are trending down.   HTN:     BP much improved and under good control  ELEVATED CREAT:  Repeat BMET with improved creat (1.41>>1.24)  SOB:  She thinks this is anxiety related.  The SOB that comes on after she takes Brilinta resolved with caffeine.  This anxiety she thinks is more related to stress and worrying about her heart and her back pain.  I will give her Xanax today to see if it improves her symptoms.  BACK PAIN: - she says that she was diagnosed with spinal stenosis and would like to see a neurosurgeon while here.  Will consult Dr. Ellene Route (patient requested) tomorrow.  Will keep in hospital today due to unresolved episodes of SOB.   Cxray on admit was normal.  Check BNP.   Signed, Fransico Him, MD  04/18/2016, 11:13 AM

## 2016-04-19 ENCOUNTER — Encounter (HOSPITAL_COMMUNITY): Payer: Self-pay | Admitting: Physician Assistant

## 2016-04-19 DIAGNOSIS — R748 Abnormal levels of other serum enzymes: Secondary | ICD-10-CM

## 2016-04-19 DIAGNOSIS — R0609 Other forms of dyspnea: Secondary | ICD-10-CM

## 2016-04-19 DIAGNOSIS — F419 Anxiety disorder, unspecified: Secondary | ICD-10-CM | POA: Diagnosis present

## 2016-04-19 LAB — GLUCOSE, CAPILLARY
GLUCOSE-CAPILLARY: 147 mg/dL — AB (ref 65–99)
GLUCOSE-CAPILLARY: 172 mg/dL — AB (ref 65–99)
Glucose-Capillary: 183 mg/dL — ABNORMAL HIGH (ref 65–99)
Glucose-Capillary: 226 mg/dL — ABNORMAL HIGH (ref 65–99)

## 2016-04-19 MED ORDER — CLOPIDOGREL BISULFATE 75 MG PO TABS
75.0000 mg | ORAL_TABLET | Freq: Every day | ORAL | Status: DC
  Administered 2016-04-19 – 2016-04-20 (×2): 75 mg via ORAL
  Filled 2016-04-19 (×2): qty 1

## 2016-04-19 MED ORDER — CLOPIDOGREL BISULFATE 75 MG PO TABS
225.0000 mg | ORAL_TABLET | Freq: Once | ORAL | Status: AC
  Administered 2016-04-19: 225 mg via ORAL
  Filled 2016-04-19: qty 3

## 2016-04-19 NOTE — Consult Note (Signed)
   [facility] CM Inpatient Consult   04/19/2016  TACCARA WILTZ [birth year] EC:[id number]  Referral for re-admission.  Patient evaluated for community based chronic disease management services with [place] [facility] as a benefit of patient's [employer]. Patient was recently discharge 04/16/16 and returned to the ED with chest pain. Chart review reveals from ED MD notes HPI: Pt presenting with c/o chest pain.  She was discharged from cardiology service this morning after Nstemi- stents were placed.  She had not been having any chest pain at time of discharge. [04/16/16] This evening after going home- she got up to use the bathroom. When she finished and sat back down she had acute onset of burning sharp midsternal pain.  She did feel short of breath with this.  Some diaphoresis.  No nausea.  She states this felt very different from her original pain with the nstemi.  Pain was relieved by nitro given by EMS.   Spoke with patient at bedside with [name] present at the bedside to explain [place] [facility] services.  Patient endorses Dr. [name] as her primary care provider.  Consent form signed.  Patient will receive post hospital discharge calls and will be evaluated for monthly home visits for assessments and disease process education.  Left contact information and [facility] literature at bedside. Made Inpatient Case Manager aware that [place] [facility] following. Of note, [facility] Care Management services does not replace or interfere with any services that are arranged by inpatient case management or social work.  For additional questions or referrals please contact:    Natividad Brood, RN BSN [facility] Liaison  [phone] business mobile phone Toll free office [phone]

## 2016-04-19 NOTE — Progress Notes (Signed)
Pt is alert and oriented up with limp and right sided leg pain, ambulated down the hall, with spasm in right leg,  states, plan to have neuro to see patient, will stay over night due to new medications and observations.

## 2016-04-19 NOTE — Progress Notes (Addendum)
Patient reviewed with Dr. Ron Parker. Patient is pending neuro surgery consult (I spoke with Dr. Trenton Gammon earlier today who will see the patient) as she requests to see neurosurgery as inpatient given her continued back pain and spinal stenosis.  In addition to this, pharmacy has left a note indicating potential drug interaction between clopidogrel and fluoxetine (a CYP2C19 enzyme inhibitor). This combination may result in decreased effectiveness of clopidogrel. She has been utilizing Xanax PRN in house for increased anxiety. I spoke with IM for consult request for assistance with adjusting her SSRI who felt the patient would better be served with consultation with psychiatry instead. Will request consult. Per IM's experience, this may not happen today, and they usually have until 12pm the following day to see. I spoke with patient about the above plan. Will plan to monitor her dyspnea overnight since Plavix was started today, obtain above consultations, and dc tomorrow if stable.   To APP seeing patient - I had preliminarily worked on a discharge summary that is under Incomplete, but updates regarding her neurosurgery consults and psychiatry input still have to be added. -I also clarified with Dr. Ron Parker that he does not wish to resume diuretic at this time.  Dayna Dunn PA-C

## 2016-04-19 NOTE — Consult Note (Signed)
Reason for Consult: Back pain Referring Physician: Medicine  Janet Choi is an 75 y.o. female.  HPI: 75 year old female admitted for treatment of myocardial infarction. Patient status post drug-eluting stent placement. Patient has a one-month history of severe right-sided lower lumbar pain. Symptom is stabbing in her right lumbosacral region. She has intermittent pain radiating into her buttocks posterior thigh and leg. She has no numbness or weakness. She has no left-sided symptoms. She does have a prior history of previous lumbar surgery done by Dr. Lorin Mercy years ago.  Past Medical History:  Diagnosis Date  . Anemia    hx (04/14/2016)  . Anxiety   . Arthritis    "severe in my back; hands; ankles" (04/14/2016)  . Basal cell carcinoma    "several burned off; one cut off"  . CAD in native artery    a. NSTEMI 04/2016 - s/p DES toLAD and LCx. PCI to LCx notable for microembolization during cath.  . Chronic lower back pain   . CKD (chronic kidney disease), stage III   . Depression   . DJD (degenerative joint disease)   . Family history of adverse reaction to anesthesia    "half-sister used to get real sick" (04/14/2016)  . GERD (gastroesophageal reflux disease)   . History of gout   . Hyperlipidemia   . Hypertension   . IBS (irritable bowel syndrome)   . Ischemic cardiomyopathy    a. 04/2016: EF 40-50% by cath, 50-55% +WMA by echo.  . 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 ([place]) ~ 2015  . Morbid obesity ([place])   . NSTEMI (non-ST elevated myocardial infarction) ([place]) 04/14/2016  . OSA on CPAP   . Peripheral neuropathy ([place])   . RLS (restless legs syndrome)   . Spinal stenosis   . Type II diabetes mellitus ([place])   . Vitamin D deficiency     Past Surgical History:  Procedure Laterality Date  . APPENDECTOMY    . BACK SURGERY    . BASAL CELL CARCINOMA EXCISION Left    leg  . CARDIAC CATHETERIZATION N/A 04/15/2016   Procedure: Left Heart Cath and Coronary Angiography;  Surgeon:  Belva Crome, MD;  Location: [facility];  Service: Cardiovascular;  Laterality: N/A;  . CARDIAC CATHETERIZATION N/A 04/15/2016   Procedure: Coronary Stent Intervention;  Surgeon: Belva Crome, MD;  Location: [facility];  Service: Cardiovascular;  Laterality: N/A;  Mid LAD Mid CFX  . CATARACT EXTRACTION, BILATERAL Bilateral   . JOINT REPLACEMENT    . LUMBAR DISC SURGERY  X 2  . MELANOMA EXCISION Left    "towards the back of my neck"  . SHOULDER ARTHROSCOPY W/ ROTATOR CUFF REPAIR Left   . SHOULDER OPEN ROTATOR CUFF REPAIR Right   . TOTAL KNEE ARTHROPLASTY Bilateral     Family History  Problem Relation Age of Onset  . Heart disease Mother   . Kidney disease Father   . Colon cancer Neg Hx   . Colon polyps Neg Hx   . Esophageal cancer Neg Hx   . Pancreatic cancer Neg Hx   . Stomach cancer Neg Hx   . Liver disease Neg Hx   . Diabetes Neg Hx     Social History:  reports that she quit smoking about 32 years ago. Her smoking use included Cigarettes. She has a 62.50 pack-year smoking history. She has never used smokeless tobacco. She reports that she does not drink alcohol or use drugs.  Allergies:  Allergies  Allergen Reactions  . Lyrica [Pregabalin] Other (See Comments)    Blurred vision  . Aspirin Other (See Comments)    Large doses per patient  . Crestor [Rosuvastatin] Other (See Comments)    Leg cramps  . Lipitor [Atorvastatin] Other (See Comments)    Leg cramps  . Minocycline Hcl Other (See Comments)    REACTION: Reaction not known  . Oruvail [Ketoprofen] Other (See Comments)  . Tricor [Fenofibrate] Other (See Comments)  . Vasotec [Enalaprilat] Other (See Comments)  . Victoza [Liraglutide] Other (See Comments)  . Zinc Other (See Comments)    Medications: I have reviewed the patient's current medications.  Results for orders placed or performed during the hospital encounter of 04/16/16 (from the past 48 hour(s))  Glucose, capillary     Status: Abnormal    Collection Time: 04/17/16  8:55 PM  Result Value Ref Range   Glucose-Capillary 168 (H) 65 - 99 mg/dL   Comment 1 Notify RN    Comment 2 Document in Chart   Basic metabolic panel     Status: Abnormal   Collection Time: 04/18/16  2:12 AM  Result Value Ref Range   Sodium 136 135 - 145 mmol/L   Potassium 4.5 3.5 - 5.1 mmol/L   Chloride 103 101 - 111 mmol/L   CO2 23 22 - 32 mmol/L   Glucose, Bld 189 (H) 65 - 99 mg/dL   BUN 28 (H) 6 - 20 mg/dL   Creatinine, Ser 1.24 (H) 0.44 - 1.00 mg/dL   Calcium 8.9 8.9 - 10.3 mg/dL   GFR calc non Af Amer 41 (L) >60 mL/min   GFR calc Af Amer 48 (L) >60 mL/min    Comment: (NOTE) The eGFR has been calculated using the CKD EPI equation. This calculation has not been validated in all clinical situations. eGFR's persistently <60 mL/min signify possible Chronic Kidney Disease.    Anion gap 10 5 - 15  Glucose, capillary     Status: Abnormal   Collection Time: 04/18/16  6:34 AM  Result Value Ref Range   Glucose-Capillary 156 (H) 65 - 99 mg/dL   Comment 1 Notify RN    Comment 2 Document in Chart   Glucose, capillary     Status: Abnormal   Collection Time: 04/18/16 11:13 AM  Result Value Ref Range   Glucose-Capillary 110 (H) 65 - 99 mg/dL   Comment 1 Notify RN   Brain natriuretic peptide     Status: Abnormal   Collection Time: 04/18/16 11:39 AM  Result Value Ref Range   B Natriuretic Peptide 463.5 (H) 0.0 - 100.0 pg/mL  Glucose, capillary     Status: Abnormal   Collection Time: 04/18/16  4:17 PM  Result Value Ref Range   Glucose-Capillary 184 (H) 65 - 99 mg/dL   Comment 1 Notify RN   Glucose, capillary     Status: Abnormal   Collection Time: 04/18/16  8:52 PM  Result Value Ref Range   Glucose-Capillary 164 (H) 65 - 99 mg/dL   Comment 1 Notify RN    Comment 2 Document in Chart   Glucose, capillary     Status: Abnormal   Collection Time: 04/19/16  6:12 AM  Result Value Ref Range   Glucose-Capillary 183 (H) 65 - 99 mg/dL  Glucose, capillary      Status: Abnormal   Collection Time: 04/19/16 11:41 AM  Result Value Ref Range   Glucose-Capillary 226 (H) 65 - 99 mg/dL   Comment 1 Notify RN  Glucose, capillary     Status: Abnormal   Collection Time: 04/19/16  4:07 PM  Result Value Ref Range   Glucose-Capillary 147 (H) 65 - 99 mg/dL    No results found.  Pertinent items noted in HPI and remainder of comprehensive ROS otherwise negative. Blood pressure (!) 116/53, pulse 61, temperature 98.1 F (36.7 C), temperature source Oral, resp. rate 18, height _0  (1.6 m), weight 104.2 kg (229 lb 11.2 oz), SpO2 98 %. Patient is awake and alert. She is sitting down comfortably in a chair. She is overweight. Examination of her lower back reveals some diffuse tenderness worse on the right side. No overt bony abnormalities. Straight leg raising negative bilaterally. Motor examination with intact motor strength in both upper and lower extremities. Sensory examination nonfocal. Reflexes hypoactive but symmetric. No evidence of long track signs.  Assessment/Plan: Patient does have significant stenosis and near to facet arthropathy right greater than left at L3-4. She has marked facet arthropathy at L4-5. She has multiple areas of disc degeneration and disc space collapse with moderately severe multilevel foraminal narrowing. There is no evidence of acute process such as a new disc herniation or neoplasm or infection at present. Certainly given her current cardiac status she is not a candidate for any type of acute surgical intervention. I think once her cardiac situation allows she probably should be treated with a course of outpatient epidural steroid injections. She may follow up in my office in 2 weeks.  Tobe Kervin A 04/19/2016, 7:07 PM

## 2016-04-19 NOTE — Progress Notes (Signed)
Patient is alert and orient complaining of acid reflux  And none radiating pain acute/chronic back pain, gave Zofran and muscle relaxer.

## 2016-04-19 NOTE — Progress Notes (Signed)
Patient Name: Janet Choi Date of Encounter: 04/19/2016  Primary Cardiologist:   Hospital Problem List     Active Problems:   Essential hypertension   CKD (chronic kidney disease), stage III   Back pain   Chest pain   SOB (shortness of breath)     Subjective   The patient had a slight episode of her morning shortness of breath/nausea. She feels that there is an anxiety component. She also feels that Brilinta may be playing a role.  Inpatient Medications    Scheduled Meds: . aspirin EC  81 mg Oral Daily  . atorvastatin  80 mg Oral q1800  . FLUoxetine  40 mg Oral Daily  . gabapentin  300 mg Oral QHS  . insulin aspart  0-15 Units Subcutaneous TID WC  . insulin aspart protamine- aspart  20 Units Subcutaneous BID WC  . losartan  50 mg Oral Daily  . rOPINIRole  3 mg Oral TID AC & HS  . sodium chloride flush  3 mL Intravenous Q12H  . ticagrelor  90 mg Oral BID   Continuous Infusions:  PRN Meds: sodium chloride, acetaminophen, ALPRAZolam, HYDROcodone-acetaminophen, nitroGLYCERIN, ondansetron (ZOFRAN) IV, sodium chloride flush, tiZANidine   Vital Signs    Vitals:   04/18/16 0419 04/18/16 1228 04/18/16 2053 04/19/16 0416  BP: 115/62 (!) 105/47 (!) 146/62 (!) 124/57  Pulse: 61 63 65 (!) 59  Resp: 20 20 20 20   Temp: 97.8 F (36.6 C) 97.6 F (36.4 C) 98.5 F (36.9 C) 98 F (36.7 C)  TempSrc: Oral Oral Oral Oral  SpO2: 99% 97% 98% 98%  Weight: 228 lb 3.2 oz (103.5 kg)   229 lb 11.2 oz (104.2 kg)  Height:        Intake/Output Summary (Last 24 hours) at 04/19/16 0818 Last data filed at 04/19/16 0710  Gross per 24 hour  Intake             1020 ml  Output             1950 ml  Net             -930 ml   Filed Weights   04/17/16 0445 04/18/16 0419 04/19/16 0416  Weight: 229 lb 8 oz (104.1 kg) 228 lb 3.2 oz (103.5 kg) 229 lb 11.2 oz (104.2 kg)    Physical Exam   The patient is significantly overweight. She is disappointed that she continues to have so many  problems including her back pain. Lungs reveal scattered rhonchi. Cardiac exam reveals S1 and S2. Abdomen is protuberant but soft. There is no significant peripheral edema.  Labs    CBC  Recent Labs  04/16/16 1813 04/16/16 1843 04/17/16 0440  WBC 5.8  --  5.7  HGB 13.1 12.6 11.7*  HCT 40.2 37.0 37.1  MCV 89.5  --  89.6  PLT 281  --  99991111   Basic Metabolic Panel  Recent Labs  04/17/16 0440 04/18/16 0212  NA 139 136  K 4.0 4.5  CL 103 103  CO2 24 23  GLUCOSE 158* 189*  BUN 32* 28*  CREATININE 1.41* 1.24*  CALCIUM 8.8* 8.9   Liver Function Tests  Recent Labs  04/17/16 0440  AST 25  ALT 15  ALKPHOS 65  BILITOT 0.5  PROT 6.1*  ALBUMIN 3.3*   No results for input(s): LIPASE, AMYLASE in the last 72 hours. Cardiac Enzymes  Recent Labs  04/16/16 2358 04/17/16 0440 04/17/16 1008  TROPONINI 6.16* 4.72* 3.83*  BNP Invalid input(s): POCBNP D-Dimer No results for input(s): DDIMER in the last 72 hours. Hemoglobin A1C No results for input(s): HGBA1C in the last 72 hours. Fasting Lipid Panel  Recent Labs  04/17/16 0440  CHOL 251*  HDL 38*  LDLCALC 145*  TRIG 341*  CHOLHDL 6.6   Thyroid Function Tests No results for input(s): TSH, T4TOTAL, T3FREE, THYROIDAB in the last 72 hours.  Invalid input(s): FREET3  Telemetry    ECG    Radiology    No results found.  Cardiac Studies     Patient Profile    The patient recently had a PCI. She was readmitted with symptoms that have been difficult to assess. There has been no ischemia. She does have some morning shortness of breath and mild nausea. She also has back pain.  Assessment & Plan      Essential hypertension      Earlier in admission, the blood pressure was elevated. This is now under better control.    CKD (chronic kidney disease), stage III       Renal function is stable at this time.    Back pain        Her back discomfort is a significant limiting factor. This is limiting her  mobility here in the hospital. She has requested neurosurgery consultation as she has had surgery in the past. We will ask for consultation today. I feel that it will be helpful with her overall planning. We want to discharge her as soon as possible. She is not ready yet this morning.    Chest pain      At this point she admits that there may be an anxiety component to her chest discomfort. No further ischemic workup is planned during this hospitalization.    SOB (shortness of breath)     BNP today is 450. Her chest x-ray was clear on admission. Her BNP was in the same range on April 14, 2016 when she had no clinical CHF. The patient feels that her symptoms may be related to Brilinta. We will switch her to Plavix. Because her intervention was within the past 30 days, we will reload with 300 of Plavix.   Signed, Dola Argyle, MD  04/19/2016, 8:18 AM

## 2016-04-19 NOTE — Discharge Summary (Signed)
Discharge Summary    Patient ID: Janet Choi,  MRN: [id number], DOB/AGE: 75-Aug-1942 75 y.o.  Admit date: 04/16/2016 Discharge date: 04/20/2016  Primary Care Provider: MCKEOWN,WILLIAM DAVID Primary Cardiologist: Dr. Oval Linsey  Discharge Diagnoses    Principal Problem:   Chest pain- reslved with stopping Brilinta now on Plaix Active Problems:   Hyperlipidemia   Essential hypertension   Morbid obesity   Poorly controlled type 2 diabetes mellitus with peripheral neuropathy ([place])   CKD (chronic kidney disease), stage III   Back pain   S/P coronary artery stent placement   SOB (shortness of breath)   CAD in native artery   Anxiety   Elevated troponin    Diagnostic Studies/Procedures    N/A _____________     History of Present Illness     Janet Choi is a 75 y.o. female with history of anemia, diabetes c/b neuropathy, HTN, hyperlipidemia, CKD stage III, morbid obesity, OSA on CPAP, and recently diagnosed CAD with NSTEMI s/p DES toLAD and LCx, ICM (EF 40-50% by cath and 50-55% by echo 04/15/16) who presented back to [facility] ED for chest pain the same day she was discharged.   She was recently admitted for NSTEMI and underwent LHC 04/15/16 with identification of [id number] segmental mid LAD stenosis and focal eccentric 95-99% mid circumflex stenosis.The first diagonal was small with segmental 80% stenosis. She underwent successful DES to mLAD and mLCx. However, her PCI to Cx was complicated by microembolization to the the most distal obtuse marginal beyond a bifurcation. She was treated with Aggrastat and IV NTG after her cath and discharged home on 12/8 on ASA/Brilinta. She was not placed on beta blocker due to bradycardia.  She returned back to the hospital same day with CP. She was sitting at home on the phone when she developed severe 10/10 burning pain in epigastric area and SOB. This pain was very different from her previous chest pain with NSTEMI. Pain lasted approx 15 minutes,  resolved with ASA and NG. She then called EMS who brought her to the ED.  Hospital Course    According to EMS SBP initially 210, decreased to 150 with NG. She was admitted for further evaluation. CXR was nonacute and CTA was negative for PE. Hgb was 11.7. Her initial troponin was 6. However, her troponin during last admission never truly peaked. As above she had a microembolization during cath which may have led to further increase in troponin at that time. The fact her istat troponin is hgher than last checked troponin was not felt to be neccesarily indicative that there was a new event. She was treated with IV heparin while enzymes were further downtrended. Dr. Percival Spanish and Dr. Tamala Julian agreed with this assessment. The patient had some residual dyspnea thus Brilinta was changed to Plavix. The patient also felt there may be some component of anxiety related to her symptoms. No further ischemic workup was performed during her admission. BNP was somewhat elevated but she was not felt to be volume overloaded during admission. Dr. Ron Parker has seen and examined the patient 04/20/16 and feels she is stable for discharge.  Of note the patient was recently diagnosed with spinal stenosis and requested to be seen by neurosurgery in the hospital due to back pain and sciatica which she felt was contributing to her dyspnea. Once her Brilinta was stopped her chest pain resolved and her SOB resolved.    Dr. Cira Servant her and "Patient does have significant stenosis and near to facet  arthropathy right greater than left at L3-4. She has marked facet arthropathy at L4-5. She has multiple areas of disc degeneration and disc space collapse with moderately severe multilevel foraminal narrowing. There is no evidence of acute process such as a new disc herniation or neoplasm or infection at present. Certainly given her current cardiac status she is not a candidate for any type of acute surgical intervention. I think once her cardiac  situation allows she probably should be treated with a course of outpatient epidural steroid injections. She may follow up in my office in 2 weeks"  Psychiatrist Dr. Louretta Shorten saw her and we stopped her prozac and she will be off of it due to interactions with plavix.   His note is not complete at discharge, but we discussed plan.  She was anxious to go home and will follow up next week as planned with Rosaria Ferries, PA-C.   This note is a combined effort of [employer], PA-c and myself.   Consultants: Neurosurgery, psychiatry  _____________  Discharge Vitals Blood pressure 127/61, pulse 69, temperature 98.6 F (37 C), temperature source Oral, resp. rate 18, height 5\' 3"  (1.6 m), weight 229 lb 1.6 oz (103.9 kg), SpO2 96 %.  Filed Weights   04/18/16 0419 04/19/16 0416 04/20/16 0417  Weight: 228 lb 3.2 oz (103.5 kg) 229 lb 11.2 oz (104.2 kg) 229 lb 1.6 oz (103.9 kg)    Labs & Radiologic Studies    CBC  Recent Labs  04/20/16 0353  WBC 4.8  HGB 10.9*  HCT 34.8*  MCV 91.1  PLT [id number]   Basic Metabolic Panel  Recent Labs  04/18/16 0212 04/20/16 0353  NA 136 138  K 4.5 4.8  CL 103 103  CO2 23 26  GLUCOSE 189* 193*  BUN 28* 25*  CREATININE 1.24* 1.27*  CALCIUM 8.9 9.1   Liver Function Tests No results for input(s): AST, ALT, ALKPHOS, BILITOT, PROT, ALBUMIN in the last 72 hours. Cardiac Enzymes No results for input(s): CKTOTAL, CKMB, CKMBINDEX, TROPONINI in the last 72 hours. Fasting Lipid Panel No results for input(s): CHOL, HDL, LDLCALC, TRIG, CHOLHDL, LDLDIRECT in the last 72 hours. _____________  Dg Chest 2 View  Result Date: 04/14/2016 CLINICAL DATA:  Chest pain and indigestion for 3 weeks, pain worsened today EXAM: CHEST  2 VIEW COMPARISON:  CT chest of 08/05/2015 and chest x-ray of the same day FINDINGS: There are bibasilar opacities present consistent with linear atelectasis or scarring. No pneumonia or effusion is seen. Mediastinal and hilar contours are  unremarkable. Cardiomegaly is stable. There are mild degenerative changes in the thoracic spine. IMPRESSION: Bibasilar linear atelectasis or scarring. Stable cardiomegaly. No pneumonia. Electronically Signed   By: Ivar Drape M.D.   On: 04/14/2016 13:26   Ct Angio Chest Pe W Or Wo Contrast  Result Date: 04/14/2016 CLINICAL DATA:  Elevated D-dimer level. Shortness of breath. Lumbar spinal injection yesterday. EXAM: CT ANGIOGRAPHY CHEST WITH CONTRAST TECHNIQUE: Multidetector CT imaging of the chest was performed using the standard protocol during bolus administration of intravenous contrast. Multiplanar CT image reconstructions and MIPs were obtained to evaluate the vascular anatomy. CONTRAST:  100 cc Isovue 370 COMPARISON:  Multiple exams, including 08/05/2015 FINDINGS: Despite efforts by the technologist and patient, motion artifact is present on today's exam and could not be eliminated. This reduces exam sensitivity and specificity. Body habitus reduces diagnostic sensitivity and specificity. Cardiovascular: No filling defect is identified in the pulmonary arterial tree to suggest pulmonary embolus. Reduction in sensitivity  for small emboli due to motion and body habitus. Atherosclerotic calcification of the aortic arch and branch vessels. No acute thoracic aortic findings. Mild cardiomegaly primarily of the left heart. Mediastinum/Nodes: Unremarkable Lungs/Pleura: Mild atelectasis along both hemidiaphragms. Upper Abdomen: Unremarkable Musculoskeletal: Suspected fragmented spur along the right posterior glenoid. Likely chronic given appearance on prior exams. Thoracic spondylosis and degenerative disc disease. Review of the MIP images confirms the above findings. IMPRESSION: 1. No filling defect is identified in the pulmonary arterial tree to suggest pulmonary embolus. Sensitivity for small emboli is reduced due to body habitus and breathing motion artifact. 2. No acute aortic findings or other specific cause  for the patient's chest pain. 3. Mild cardiomegaly. 4. Mild atelectasis along both hemidiaphragms. 5. Thoracic spondylosis and degenerative disc disease. Electronically Signed   By: Van Clines M.D.   On: 04/14/2016 15:43   Mr Lumbar Spine Wo Contrast  Result Date: 03/30/2016 CLINICAL DATA:  Severe low back pain for 12 days. Two prior lumbar spine surgeries. EXAM: MRI LUMBAR SPINE WITHOUT CONTRAST TECHNIQUE: Multiplanar, multisequence MR imaging of the lumbar spine was performed. No intravenous contrast was administered. COMPARISON:  07/15/2015 FINDINGS: Segmentation:  Standard. Alignment: Mild lumbar levoscoliosis. Unchanged 4 mm anterolisthesis of L3 on L4. Vertebrae: No evidence of fracture or destructive osseous lesion. Moderate type 1 degenerative endplate changes at [id number] have mildly increased. The lumbar spinal canal is mildly small in caliber diffusely on a congenital basis due to short pedicles. Conus medullaris: Extends to the L1-2 level and appears normal. Paraspinal and other soft tissues: 1.8 cm T2 hyperintense left renal lesion, likely a cyst. Postsurgical changes in the posterior paraspinal soft tissues with asymmetric left lower lumbar paraspinal muscle atrophy. Disc levels: Disc desiccation throughout the lumbar spine. Similar appearance of multilevel disc space narrowing, mild at L1-2, mild-to-moderate at L2-3, and severe at L3-4 and L4-5. T10-11: Only imaged sagittally. Mild disc bulging without evidence of compressive stenosis, unchanged. T11-12: Only imaged sagittally. Mild disc bulging without evidence of compressive stenosis, unchanged. T12-L1: Mild disc bulging and mild facet arthrosis result in borderline spinal and borderline left neural foraminal stenosis, unchanged. L1-2: Mild disc bulging and mild facet arthrosis. Small right subarticular disc protrusion versus artifact. No significant stenosis identified. L2-3: Disc bulging asymmetric to the left, ligamentum flavum  thickening, and moderate right and mild left facet hypertrophy result in mild-to-moderate spinal stenosis and mild bilateral neural foraminal stenosis, unchanged. Small right and trace left facet joint effusions. L3-4: Prior right laminectomy. Listhesis with bulging uncovered disc, endplate spurring, left ligamentum flavum thickening, and advanced facet arthrosis result in moderate to severe spinal stenosis and moderate to severe right and moderate left neural foraminal stenosis, unchanged. L4-5: Prior left laminotomy. Disc bulging, left paracentral disc protrusion, endplate spurring, and mild facet arthrosis result in mild-to-moderate spinal stenosis, mild right and moderate left lateral recess stenosis, and moderate right and moderate to severe left neural foraminal stenosis, unchanged. L5-S1: Disc bulging, ligamentum flavum thickening, and advanced facet arthrosis result in mild spinal stenosis, left greater than right lateral recess stenosis, and moderate right and severe left neural foraminal stenosis, unchanged. Moderate right and small left facet joint effusions. IMPRESSION: 1. Increased degenerative endplate edema at [id number]. 2. Otherwise unchanged lumbar disc and facet degeneration as detailed above. Moderate to severe spinal stenosis at L3-4. Electronically Signed   By: Logan Bores M.D.   On: 03/30/2016 08:38   Dg Chest Portable 1 View  Result Date: 04/16/2016 CLINICAL DATA:  Chest pain. EXAM: PORTABLE  CHEST 1 VIEW COMPARISON:  Radiographs and CT 2 days prior 04/14/2016 FINDINGS: Stable cardiomegaly and mediastinal contours. Unchanged left basal atelectasis. Exam is otherwise unchanged. No new focal airspace disease, pleural fluid, pulmonary edema or pneumothorax. Unchanged osseous structures. IMPRESSION: Stable cardiomegaly without acute abnormality. Electronically Signed   By: Jeb Levering M.D.   On: 04/16/2016 20:29   Disposition   Pt is being discharged home today in good  condition.  Follow-up Plans & Appointments   Stop Prazac.  We stopped your lasix if you begin to have more swelling please call the office.  Heart healthy diabetic diet.  I did prescribe a few xanax if you anxiety increases.  We will not prescribe more.  Please see your PCP for this.   Do not stop asprin or plavix.    Follow-up Information    Alesia Richards, MD. Go on 04/29/2016.   Specialty:  Internal Medicine Why:  @11 :[place]ane information: [address] [place] [address] [phone]        Barrett, Suanne Marker, PA-C Follow up.   Specialties:  Cardiology, Radiology Why:  Follow-up at [facility] office on 04/26/16 at 10am. Suanne Marker is one of the PAs that works with Dr. Oval Linsey and her team. Contact information: [address] [place] [address] [phone]        Charlie Pitter, MD. Go on 04/27/2016.   Specialty:  Neurosurgery Why:  @1 ;30pm Contact information: [address] [place] [address] [phone]          Discharge Instructions    AMB Referral to [facility]    Complete by:  As directed    Reason for consult:  Re-admission post hospital monitoring, [employer] member   Expected date of contact:  1-3 days (reserved for hospital discharges)      Discharge Medications     Medication List    STOP taking these medications   FLUoxetine 40 MG capsule Commonly known as:  PROZAC   furosemide 40 MG tablet Commonly known as:  LASIX   potassium chloride SA 20 MEQ tablet Commonly known as:  K-DUR,KLOR-CON   ticagrelor 90 MG Tabs tablet Commonly known as:  BRILINTA     TAKE these medications   acetaminophen 325 MG tablet Commonly known as:  TYLENOL Take 2 tablets (650 mg total) by mouth every 4 (four) hours as needed for headache or mild pain.   ALPRAZolam 0.25 MG tablet Commonly known as:  XANAX Take 1 tablet (0.25 mg total) by mouth 3 (three)  times daily as needed for anxiety.   aspirin 81 MG EC tablet Take 1 tablet (81 mg total) by mouth daily. Start taking on:  04/21/2016 What changed:  when to take this   atorvastatin 80 MG tablet Commonly known as:  LIPITOR Take 1 tablet (80 mg total) by mouth daily at 6 PM.   clopidogrel 75 MG tablet Commonly known as:  PLAVIX Take 1 tablet (75 mg total) by mouth daily. Start taking on:  04/21/2016   FLECTOR 1.3 % Ptch Generic drug:  diclofenac APPLY 1 PATCH EVERY 12 HOURS AS NEEDED THEN TAKE OFF FOR 12 HOURS   gabapentin 600 MG tablet Commonly known as:  NEURONTIN Take 300 mg by mouth at bedtime.   HYDROcodone-acetaminophen 5-325 MG tablet Commonly known as:  NORCO/VICODIN Take 1 tablet by mouth every 6 (six) hours as needed for moderate pain.   insulin NPH-regular Human (70-30) 100 UNIT/ML injection Commonly known  as:  NOVOLIN 70/30 Inject 40-60 Units into the skin See admin instructions. 40 units in the morning and 60 units at bedtime   losartan 50 MG tablet Commonly known as:  COZAAR Take 1 tablet (50 mg total) by mouth daily.   nitroGLYCERIN 0.4 MG SL tablet Commonly known as:  NITROSTAT Place 1 tablet (0.4 mg total) under the tongue every 5 (five) minutes x 3 doses as needed for chest pain.   rOPINIRole 3 MG tablet Commonly known as:  REQUIP Take 1 tablet (3 mg total) by mouth 4 (four) times daily. Take 1 tablet 4 x / day for Restless Legs What changed:  how much to take  how to take this  when to take this   tiZANidine 4 MG tablet Commonly known as:  ZANAFLEX Take 4 mg by mouth every 6 (six) hours as needed for muscle spasms. MUSCLE SPASM        Allergies:  Allergies  Allergen Reactions  . Brilinta [Ticagrelor] Shortness Of Breath    Chest pain  . Lyrica [Pregabalin] Other (See Comments)    Blurred vision  . Aspirin Other (See Comments)    Large doses per patient  . Crestor [Rosuvastatin] Other (See Comments)    Leg cramps  . Lipitor  [Atorvastatin] Other (See Comments)    Leg cramps  . Minocycline Hcl Other (See Comments)    REACTION: Reaction not known  . Oruvail [Ketoprofen] Other (See Comments)  . Tricor [Fenofibrate] Other (See Comments)  . Vasotec [Enalaprilat] Other (See Comments)  . Victoza [Liraglutide] Other (See Comments)  . Zinc Other (See Comments)    Outstanding Labs/Studies   N/A  Duration of Discharge Encounter   Greater than 30 minutes including physician time.  Signed, Cecilie Kicks, NP  04/20/2016, 4:12 PM  Patient seen and examined. I agree with the assessment and plan as detailed above. See also my additional thoughts below.   I made the decision for discharge. We carefully assessed her cardiac status. In addition we have carefully assessed her back pain and medications that she needs from the psychiatric viewpoint. The final plan for her psychiatric med is pending. We need to be sure that any medication does not affect the effectiveness of Plavix.  Dola Argyle, MD, [facility] 04/21/2016 9:03 AM

## 2016-04-20 ENCOUNTER — Encounter (HOSPITAL_COMMUNITY): Payer: Self-pay | Admitting: Cardiology

## 2016-04-20 DIAGNOSIS — R7989 Other specified abnormal findings of blood chemistry: Secondary | ICD-10-CM

## 2016-04-20 DIAGNOSIS — Z888 Allergy status to other drugs, medicaments and biological substances status: Secondary | ICD-10-CM

## 2016-04-20 DIAGNOSIS — Z79899 Other long term (current) drug therapy: Secondary | ICD-10-CM

## 2016-04-20 DIAGNOSIS — Z794 Long term (current) use of insulin: Secondary | ICD-10-CM

## 2016-04-20 DIAGNOSIS — F411 Generalized anxiety disorder: Secondary | ICD-10-CM

## 2016-04-20 DIAGNOSIS — R778 Other specified abnormalities of plasma proteins: Secondary | ICD-10-CM | POA: Diagnosis present

## 2016-04-20 DIAGNOSIS — Z8249 Family history of ischemic heart disease and other diseases of the circulatory system: Secondary | ICD-10-CM

## 2016-04-20 DIAGNOSIS — Z87891 Personal history of nicotine dependence: Secondary | ICD-10-CM

## 2016-04-20 DIAGNOSIS — Z841 Family history of disorders of kidney and ureter: Secondary | ICD-10-CM

## 2016-04-20 LAB — CBC
HEMATOCRIT: 34.8 % — AB (ref 36.0–46.0)
Hemoglobin: 10.9 g/dL — ABNORMAL LOW (ref 12.0–15.0)
MCH: 28.5 pg (ref 26.0–34.0)
MCHC: 31.3 g/dL (ref 30.0–36.0)
MCV: 91.1 fL (ref 78.0–100.0)
Platelets: 207 10*3/uL (ref 150–400)
RBC: 3.82 MIL/uL — AB (ref 3.87–5.11)
RDW: 13.8 % (ref 11.5–15.5)
WBC: 4.8 10*3/uL (ref 4.0–10.5)

## 2016-04-20 LAB — BASIC METABOLIC PANEL
Anion gap: 9 (ref 5–15)
BUN: 25 mg/dL — ABNORMAL HIGH (ref 6–20)
CHLORIDE: 103 mmol/L (ref 101–111)
CO2: 26 mmol/L (ref 22–32)
Calcium: 9.1 mg/dL (ref 8.9–10.3)
Creatinine, Ser: 1.27 mg/dL — ABNORMAL HIGH (ref 0.44–1.00)
GFR calc non Af Amer: 40 mL/min — ABNORMAL LOW (ref >60)
GFR, EST AFRICAN AMERICAN: 47 mL/min — AB (ref >60)
Glucose, Bld: 193 mg/dL — ABNORMAL HIGH (ref 65–99)
POTASSIUM: 4.8 mmol/L (ref 3.5–5.1)
SODIUM: 138 mmol/L (ref 135–145)

## 2016-04-20 LAB — GLUCOSE, CAPILLARY
Glucose-Capillary: 236 mg/dL — ABNORMAL HIGH (ref 65–99)
Glucose-Capillary: 165 mg/dL — ABNORMAL HIGH (ref 65–99)
Glucose-Capillary: 161 mg/dL — ABNORMAL HIGH (ref 65–99)

## 2016-04-20 MED ORDER — ATORVASTATIN CALCIUM 80 MG PO TABS: 80.0000 mg | ORAL_TABLET | Freq: Every day | ORAL | 3 refills | Status: DC

## 2016-04-20 MED ORDER — ROPINIROLE HCL 3 MG PO TABS: 3.0000 mg | ORAL_TABLET | Freq: Four times a day (QID) | ORAL | Status: DC

## 2016-04-20 MED ORDER — ASPIRIN 81 MG PO TBEC: 81.0000 mg | DELAYED_RELEASE_TABLET | Freq: Every day | ORAL | Status: DC

## 2016-04-20 MED ORDER — CLOPIDOGREL BISULFATE 75 MG PO TABS: 75.0000 mg | ORAL_TABLET | Freq: Every day | ORAL | 6 refills | Status: DC

## 2016-04-20 MED ORDER — ACETAMINOPHEN 325 MG PO TABS: 650.0000 mg | ORAL_TABLET | ORAL | Status: DC | PRN

## 2016-04-20 MED ORDER — ALPRAZOLAM 0.25 MG PO TABS: 0.2500 mg | ORAL_TABLET | Freq: Three times a day (TID) | ORAL | 0 refills | Status: DC | PRN

## 2016-04-20 MED ORDER — NITROGLYCERIN 0.4 MG SL SUBL: 0.4000 mg | SUBLINGUAL_TABLET | SUBLINGUAL | 4 refills | Status: DC | PRN

## 2016-04-20 NOTE — Consult Note (Signed)
[facility] Face-to-Face Psychiatry Consult   Reason for Consult:  Anxietty and history of MI Referring Physician:  Dr. Ron Parker Patient Identification: ZAYLYNN RICKETT MRN:  [id number] Principal Diagnosis: Chest pain Diagnosis:   Patient Active Problem List   Diagnosis Date Noted  . Elevated troponin [R74.8]   . SOB (shortness of breath) [R06.02] 04/19/2016  . CAD in native artery [I25.10] 04/19/2016  . Anxiety [F41.9] 04/19/2016  . Chest pain [R07.9] 04/16/2016  . Other chest pain [R07.89]   . Coronary artery disease involving native coronary artery of native heart without angina pectoris [I25.10]   . Hypertensive heart disease without heart failure [I11.9]   . S/P coronary artery stent placement [Z95.5]   . NSTEMI (non-ST elevated myocardial infarction) ([place]) [I21.4] 04/14/2016  . CKD (chronic kidney disease), stage III [N18.3] 04/14/2016  . Back pain [M54.9] 04/14/2016  . Iron deficiency anemia [D50.9] 08/15/2015  . Hypoxia [R09.02] 08/06/2015  . OSA on CPAP [G47.33, Z99.89] 04/16/2015  . BMI 38.0-38.9,adult [Z68.38] 04/14/2015  . Medicare annual wellness visit, subsequent [Z00.00] 04/14/2015  . RLS (restless legs syndrome) [G25.81] 03/20/2014  . Poorly controlled type 2 diabetes mellitus with peripheral neuropathy ([place]) [E11.42, E11.65] 03/20/2014  . Compliance poor [Z91.19] 11/28/2013  . Medication management [Z79.899] 09/12/2013  . Morbid obesity [E66.01] 05/14/2013  . Vitamin D deficiency [E55.9] 05/14/2013  . T2_NIDDM w/ CKD3 (GFR 35 ml/min) [E11.29] 02/24/2009  . Hyperlipidemia [E78.2] 02/24/2009  . Essential hypertension [I10] 02/24/2009  . Diverticulosis of large intestine [K57.30] 02/24/2009  . History of colonic polyps [Z86.010] 02/24/2009    Total Time spent with patient: 1 hour  Subjective:   NEAVE LENGER is a 75 y.o. female patient admitted with chest pain.  HPI:  JONELLE BANN is a 75 y.o. female, seen, chart reviewed for this face-to-face psychiatric consultation  and evaluation of medication management for anxiety. Patient appeared sitting in a chair next to her bed and stated she has been doing well without significant depression or anxiety. Patient reported she has been stressed regarding her grownup son who is 59 years old lives in his own apartment but has chronic mental illness and also anger management difficulties along with financial problems. patient also reported she has been taking fluoxetine over several years from primary care physician. Patient reported about a week ago she was suffered with acute myocardial infarction and then placed on blood thinners Plavix. Patient medication fluoxetine was discontinued as drug drug interaction with Plavix. Patient has been taking alprazolam and gabapentin which are helping to control her anxiety and willing not to restart her fluoxetine which was stopped recently. Patient stated she has been anxious about going home because she has been in hospital a few days and has no more chest pain. Patient denies current symptoms of depression, anxiety, disturbed sleep or appetite. Patient denied suicidal/homicidal ideation, intention or plans.  Medical history: Patientwith a history of anemia, diabetes, HTN, hyperlipidemia and recently diagnosed CAD with NSTEMI s/p DES to LAD and LCx (discharged today) who presented back to [facility] ED for chest pain.  She reports she was sitting at home on the phone when she developed severe 10/10 burning pain in epigastric area and SOB. This pain was very different from her previous chest pain with NSTEMI. Pain lasted approx 15 minutes, resolved with ASA and NG. According to EMS SBP initially 210, decreased to 150 with NG. She is currently pain free.  Past Psychiatric History: Anxiety and has no history of acute psychiatric hospitalization.  Risk to  Self: Is patient at risk for suicide?: No Risk to Others:   Prior Inpatient Therapy:   Prior Outpatient Therapy:    Past Medical History:  Past  Medical History:  Diagnosis Date  . Anemia    hx (04/14/2016)  . Anxiety   . Arthritis    "severe in my back; hands; ankles" (04/14/2016)  . Basal cell carcinoma    "several burned off; one cut off"  . CAD in native artery    a. NSTEMI 04/2016 - s/p DES toLAD and LCx. PCI to LCx notable for microembolization during cath.  . Chronic lower back pain   . CKD (chronic kidney disease), stage III   . Depression   . DJD (degenerative joint disease)   . Family history of adverse reaction to anesthesia    "half-sister used to get real sick" (04/14/2016)  . GERD (gastroesophageal reflux disease)   . History of gout   . Hyperlipidemia   . Hypertension   . IBS (irritable bowel syndrome)   . Ischemic cardiomyopathy    a. 04/2016: EF 40-50% by cath, 50-55% +WMA by echo.  . 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  . Morbid obesity (HCC)   . NSTEMI (non-ST elevated myocardial infarction) (HCC) 04/14/2016  . OSA on CPAP   . Peripheral neuropathy (HCC)   . RLS (restless legs syndrome)   . Spinal stenosis   . Type II diabetes mellitus (HCC)   . Vitamin D deficiency     Past Surgical History:  Procedure Laterality Date  . APPENDECTOMY    . BACK SURGERY    . BASAL CELL CARCINOMA EXCISION Left    leg  . CARDIAC CATHETERIZATION N/A 04/15/2016   Procedure: Left Heart Cath and Coronary Angiography;  Surgeon: Lyn Records, MD;  Location: [facility] INVASIVE CV LAB;  Service: Cardiovascular;  Laterality: N/A;  . CARDIAC CATHETERIZATION N/A 04/15/2016   Procedure: Coronary Stent Intervention;  Surgeon: Lyn Records, MD;  Location: [facility] INVASIVE CV LAB;  Service: Cardiovascular;  Laterality: N/A;  Mid LAD Mid CFX  . CATARACT EXTRACTION, BILATERAL Bilateral   . JOINT REPLACEMENT    . LUMBAR DISC SURGERY  X 2  . MELANOMA EXCISION Left    "towards the back of my neck"  . SHOULDER ARTHROSCOPY W/ ROTATOR CUFF REPAIR Left   . SHOULDER OPEN ROTATOR CUFF REPAIR Right   . TOTAL KNEE ARTHROPLASTY  Bilateral    Family History:  Family History  Problem Relation Age of Onset  . Heart disease Mother   . Kidney disease Father   . Colon cancer Neg Hx   . Colon polyps Neg Hx   . Esophageal cancer Neg Hx   . Pancreatic cancer Neg Hx   . Stomach cancer Neg Hx   . Liver disease Neg Hx   . Diabetes Neg Hx    Family Psychiatric  History: Patient has a grownup son, has been suffering with anger management, substance abuse and possibly bipolar disorder Social History:  History  Alcohol Use No     History  Drug Use No    Social History   Social History  . Marital status: Widowed    Spouse name: N/A  . Number of children: N/A  . Years of education: N/A   Social History Main Topics  . Smoking status: Former Smoker    Packs/day: 2.50    Years: 25.00    Types: Cigarettes    Quit date: 05/11/1983  . Smokeless tobacco:  Never Used  . Alcohol use No  . Drug use: No  . Sexual activity: No   Other Topics Concern  . None   Social History Narrative  . None   Additional Social History:    Allergies:   Allergies  Allergen Reactions  . Brilinta [Ticagrelor] Shortness Of Breath    Chest pain  . Lyrica [Pregabalin] Other (See Comments)    Blurred vision  . Aspirin Other (See Comments)    Large doses per patient  . Crestor [Rosuvastatin] Other (See Comments)    Leg cramps  . Lipitor [Atorvastatin] Other (See Comments)    Leg cramps  . Minocycline Hcl Other (See Comments)    REACTION: Reaction not known  . Oruvail [Ketoprofen] Other (See Comments)  . Tricor [Fenofibrate] Other (See Comments)  . Vasotec [Enalaprilat] Other (See Comments)  . Victoza [Liraglutide] Other (See Comments)  . Zinc Other (See Comments)    Labs:  Results for orders placed or performed during the hospital encounter of 04/16/16 (from the past 48 hour(s))  Glucose, capillary     Status: Abnormal   Collection Time: 04/18/16  4:17 PM  Result Value Ref Range   Glucose-Capillary 184 (H) 65 - 99 mg/dL    Comment 1 Notify RN   Glucose, capillary     Status: Abnormal   Collection Time: 04/18/16  8:52 PM  Result Value Ref Range   Glucose-Capillary 164 (H) 65 - 99 mg/dL   Comment 1 Notify RN    Comment 2 Document in Chart   Glucose, capillary     Status: Abnormal   Collection Time: 04/19/16  6:12 AM  Result Value Ref Range   Glucose-Capillary 183 (H) 65 - 99 mg/dL  Glucose, capillary     Status: Abnormal   Collection Time: 04/19/16 11:41 AM  Result Value Ref Range   Glucose-Capillary 226 (H) 65 - 99 mg/dL   Comment 1 Notify RN   Glucose, capillary     Status: Abnormal   Collection Time: 04/19/16  4:07 PM  Result Value Ref Range   Glucose-Capillary 147 (H) 65 - 99 mg/dL  Glucose, capillary     Status: Abnormal   Collection Time: 04/19/16  9:09 PM  Result Value Ref Range   Glucose-Capillary 172 (H) 65 - 99 mg/dL   Comment 1 Notify RN    Comment 2 Document in Chart   Basic metabolic panel     Status: Abnormal   Collection Time: 04/20/16  3:53 AM  Result Value Ref Range   Sodium 138 135 - 145 mmol/L   Potassium 4.8 3.5 - 5.1 mmol/L   Chloride 103 101 - 111 mmol/L   CO2 26 22 - 32 mmol/L   Glucose, Bld 193 (H) 65 - 99 mg/dL   BUN 25 (H) 6 - 20 mg/dL   Creatinine, Ser 1.27 (H) 0.44 - 1.00 mg/dL   Calcium 9.1 8.9 - 10.3 mg/dL   GFR calc non Af Amer 40 (L) >60 mL/min   GFR calc Af Amer 47 (L) >60 mL/min    Comment: (NOTE) The eGFR has been calculated using the CKD EPI equation. This calculation has not been validated in all clinical situations. eGFR's persistently <60 mL/min signify possible Chronic Kidney Disease.    Anion gap 9 5 - 15  CBC     Status: Abnormal   Collection Time: 04/20/16  3:53 AM  Result Value Ref Range   WBC 4.8 4.0 - 10.5 K/uL   RBC 3.82 (L) 3.87 -  5.11 MIL/uL   Hemoglobin 10.9 (L) 12.0 - 15.0 g/dL   HCT 34.8 (L) 36.0 - 46.0 %   MCV 91.1 78.0 - 100.0 fL   MCH 28.5 26.0 - 34.0 pg   MCHC 31.3 30.0 - 36.0 g/dL   RDW 13.8 11.5 - 15.5 %   Platelets 207  150 - 400 K/uL  Glucose, capillary     Status: Abnormal   Collection Time: 04/20/16  5:43 AM  Result Value Ref Range   Glucose-Capillary 236 (H) 65 - 99 mg/dL  Glucose, capillary     Status: Abnormal   Collection Time: 04/20/16 12:29 PM  Result Value Ref Range   Glucose-Capillary 161 (H) 65 - 99 mg/dL   Comment 1 Notify RN     Current Facility-Administered Medications  Medication Dose Route Frequency Provider Last Rate Last Dose  . 0.9 %  sodium chloride infusion  250 mL Intravenous PRN Eileen Stanford, PA-C      . acetaminophen (TYLENOL) tablet 650 mg  650 mg Oral Q4H PRN Eileen Stanford, PA-C   650 mg at 04/20/16 1227  . ALPRAZolam Duanne Moron) tablet 0.25 mg  0.25 mg Oral TID PRN Sueanne Margarita, MD   0.25 mg at 04/19/16 0701  . aspirin EC tablet 81 mg  81 mg Oral Daily Eileen Stanford, PA-C   81 mg at 04/20/16 1448  . atorvastatin (LIPITOR) tablet 80 mg  80 mg Oral q1800 Eileen Stanford, PA-C   80 mg at 04/19/16 1820  . clopidogrel (PLAVIX) tablet 75 mg  75 mg Oral Daily Carlena Bjornstad, MD   75 mg at 04/20/16 0955  . gabapentin (NEURONTIN) capsule 300 mg  300 mg Oral QHS Eileen Stanford, PA-C   300 mg at 04/19/16 2234  . HYDROcodone-acetaminophen (NORCO/VICODIN) 5-325 MG per tablet 1 tablet  1 tablet Oral Q6H PRN Eileen Stanford, PA-C   1 tablet at 04/19/16 2234  . insulin aspart (novoLOG) injection 0-15 Units  0-15 Units Subcutaneous TID WC Rhonda G Barrett, PA-C   3 Units at 04/20/16 1359  . insulin aspart protamine- aspart (NOVOLOG MIX 70/30) injection 20 Units  20 Units Subcutaneous BID WC Arnoldo Lenis, MD   20 Units at 04/20/16 0827  . losartan (COZAAR) tablet 50 mg  50 mg Oral Daily Eileen Stanford, PA-C   50 mg at 04/20/16 1856  . nitroGLYCERIN (NITROSTAT) SL tablet 0.4 mg  0.4 mg Sublingual Q5 Min x 3 PRN Eileen Stanford, PA-C      . ondansetron [facility]) injection 4 mg  4 mg Intravenous Q6H PRN Eileen Stanford, PA-C   4 mg at 04/19/16 0719  .  rOPINIRole (REQUIP) tablet 3 mg  3 mg Oral TID AC & HS Eileen Stanford, PA-C   3 mg at 04/20/16 0955  . sodium chloride flush (NS) 0.9 % injection 3 mL  3 mL Intravenous Q12H Eileen Stanford, PA-C   3 mL at 04/20/16 1000  . sodium chloride flush (NS) 0.9 % injection 3 mL  3 mL Intravenous PRN Eileen Stanford, PA-C      . tiZANidine (ZANAFLEX) tablet 4 mg  4 mg Oral Q6H PRN Eileen Stanford, PA-C   4 mg at 04/19/16 2234    Musculoskeletal: Strength & Muscle Tone: within normal limits Gait & Station: unable to stand Patient leans: N/A  Psychiatric Specialty Exam: Physical Exam as per history and physical  ROS morbid overweight, status post myocardial infarction  and recent shortness of breath, generalized stress and mild anxiety about anticipated discharge home. No Fever-chills, No Headache, No changes with Vision or hearing, reports vertigo No problems swallowing food or Liquids, No Chest pain, Cough or Shortness of Breath, No Abdominal pain, No Nausea or Vommitting, Bowel movements are regular, No Blood in stool or Urine, No dysuria, No new skin rashes or bruises, No new joints pains-aches,  No new weakness, tingling, numbness in any extremity, No recent weight gain or loss, No polyuria, polydypsia or polyphagia,   A full 10 point Review of Systems was done, except as stated above, all other Review of Systems were negative.   Blood pressure 127/61, pulse 69, temperature 98.6 F (37 C), temperature source Oral, resp. rate 18, height '5\' 3"'$  (1.6 m), weight 103.9 kg (229 lb 1.6 oz), SpO2 96 %.Body mass index is 40.58 kg/m.  General Appearance: Casual  Eye Contact:  Good  Speech:  Clear and Coherent  Volume:  Normal  Mood:  Anxious  Affect:  Appropriate and Congruent  Thought Process:  Coherent and Goal Directed  Orientation:  Full (Time, Place, and Person)  Thought Content:  Logical  Suicidal Thoughts:  No  Homicidal Thoughts:  No  Memory:  Immediate;    Good Recent;   Fair Remote;   Fair  Judgement:  Intact  Insight:  Good  Psychomotor Activity:  Decreased  Concentration:  Concentration: Good and Attention Span: Good  Recall:  Good  Fund of Knowledge:  Good  Language:  Good  Akathisia:  Negative  Handed:  Right  AIMS (if indicated):     Assets:  Communication Skills Desire for Improvement Financial Resources/Insurance Housing Leisure Time Resilience Social Support Transportation  ADL's:  Intact  Cognition:  WNL  Sleep:        Treatment Plan Summary: 75 years old female with multiple medical problems presented with shortness of breath father with recent acute MI. Patient has been compliant with her fluoxetine over several weeks from the primary care physician for anxiety disorder. Patient reportedly has multiple stresses regarding her 23 years old son. Patient reportedly doing well without fluoxetine since admitted to the hospital  Case discussed with cardiologist and informed no SSRIs secondary to drug drug interactions Discontinue fluoxetine as planned because of drug drug interaction Continue gabapentin and alprazolam for anxiety disorder Patient follow-up with primary care physician for medication management Patient has no safety concerns during this evaluation No medication changes made during my visit.  Disposition: No evidence of imminent risk to self or others at present.   Supportive therapy provided about ongoing stressors.  Ambrose Finland, MD 04/20/2016 2:31 PM

## 2016-04-20 NOTE — Discharge Instructions (Signed)
Stop Prazac.  We stopped your lasix if you begin to have more swelling please call the office.  Heart healthy diabetic diet.  I did prescribe a few xanax if you anxiety increases.  We will not prescribe more.  Please see your PCP for this.   Do not stop asprin or plavix.

## 2016-04-20 NOTE — Progress Notes (Signed)
Patient Name: Janet Choi Date of Encounter: 04/20/2016  Primary Cardiologist: new Dr. Sande Rives Problem List     Principal Problem:   Chest pain Active Problems:   Hyperlipidemia   Essential hypertension   Morbid obesity   Poorly controlled type 2 diabetes mellitus with peripheral neuropathy (HCC)   CKD (chronic kidney disease), stage III   Back pain   S/P coronary artery stent placement   SOB (shortness of breath)   CAD in native artery   Anxiety     Subjective   Feels better, no SOB and chest pain resolved.  Understands back pain will be ongoing.  Also is agreeable to see psychiatrist to decide on another medication rather than prozac.   Inpatient Medications    Scheduled Meds: . aspirin EC  81 mg Oral Daily  . atorvastatin  80 mg Oral q1800  . clopidogrel  75 mg Oral Daily  . FLUoxetine  40 mg Oral Daily  . gabapentin  300 mg Oral QHS  . insulin aspart  0-15 Units Subcutaneous TID WC  . insulin aspart protamine- aspart  20 Units Subcutaneous BID WC  . losartan  50 mg Oral Daily  . rOPINIRole  3 mg Oral TID AC & HS  . sodium chloride flush  3 mL Intravenous Q12H   Continuous Infusions:  PRN Meds: sodium chloride, acetaminophen, ALPRAZolam, HYDROcodone-acetaminophen, nitroGLYCERIN, ondansetron (ZOFRAN) IV, sodium chloride flush, tiZANidine   Vital Signs    Vitals:   04/19/16 1551 04/19/16 1600 04/19/16 2002 04/20/16 0417  BP:   (!) 117/55 (!) 108/58  Pulse:   73 60  Resp:   18 17  Temp:   98.6 F (37 C) 97.8 F (36.6 C)  TempSrc:   Oral Oral  SpO2: 95% 98% 96% 97%  Weight:    229 lb 1.6 oz (103.9 kg)  Height:        Intake/Output Summary (Last 24 hours) at 04/20/16 0900 Last data filed at 04/20/16 0829  Gross per 24 hour  Intake              720 ml  Output             1700 ml  Net             -980 ml   Filed Weights   04/18/16 0419 04/19/16 0416 04/20/16 0417  Weight: 228 lb 3.2 oz (103.5 kg) 229 lb 11.2 oz (104.2 kg) 229 lb 1.6  oz (103.9 kg)    Physical Exam   GEN: Well nourished,  in no acute distress. Up in Chair  HEENT: normocephalic, sclera clear, mucus membranes moist.  Neck: Supple, no JVD, or masses. Cardiac: RRR, no murmurs, rubs, or gallops. No clubbing, cyanosis, edema.  Radials/DP 2+ and equal bilaterally.  Respiratory:  Respirations regular and unlabored, diminished to clear to auscultation bilaterally without rales, rhonchi or wheezes. GI: Abd -obese, Soft, nontender, nondistended, BS + x 4. MS: no deformity or atrophy. Skin: warm and dry, brisk capillary refill, no obvious rash Neuro:  Alert and oriented X 3 MAE, follows commands Psych: answers questions appropriately,Normal and pleasant affect.   Labs    CBC  Recent Labs  04/20/16 0353  WBC 4.8  HGB 10.9*  HCT 34.8*  MCV 91.1  PLT [id number]   Basic Metabolic Panel  Recent Labs  04/18/16 0212 04/20/16 0353  NA 136 138  K 4.5 4.8  CL 103 103  CO2 23 26  GLUCOSE 189* 193*  BUN 28* 25*  CREATININE 1.24* 1.27*  CALCIUM 8.9 9.1   Liver Function Tests No results for input(s): AST, ALT, ALKPHOS, BILITOT, PROT, ALBUMIN in the last 72 hours. No results for input(s): LIPASE, AMYLASE in the last 72 hours. Cardiac Enzymes  Recent Labs  04/17/16 1008  TROPONINI 3.83*   BNP Invalid input(s): POCBNP D-Dimer No results for input(s): DDIMER in the last 72 hours. Hemoglobin A1C No results for input(s): HGBA1C in the last 72 hours. Fasting Lipid Panel No results for input(s): CHOL, HDL, LDLCALC, TRIG, CHOLHDL, LDLDIRECT in the last 72 hours. Thyroid Function Tests No results for input(s): TSH, T4TOTAL, T3FREE, THYROIDAB in the last 72 hours.  Invalid input(s): FREET3  Telemetry    SR - Personally Reviewed  ECG    No new EKGs - Personally Reviewed  Radiology    No results found.  Cardiac Studies   None this admit   Patient Profile      75 y.o.femalewith a history of anemia, diabetes, HTN, hyperlipidemia and  recently diagnosed CAD with NSTEMI s/p DES to LAD and LCx (discharged 12/8) who presented back to [facility] ED for chest pain the same day.  She was readmitted with symptoms that have been difficult to assess. There has been no ischemia. She does have some morning shortness of breath and mild nausea. She also has back pain.  Stopping Brilinta has resolved SOB and chest pain.   Assessment & Plan    Essential hypertension      Earlier in admission, the blood pressure was elevated. This is now controlled.    CKD (chronic kidney disease), stage III       Renal function is stable at this time. Cr 1.27 today    Back pain        Her back discomfort is a significant limiting factor. This is limiting her mobility here in the hospital.   Neurosurgery has consulted.  Per Dr. Trenton Gammon "Patient does have significant stenosis and near to facet arthropathy right greater than left at L3-4. She has marked facet arthropathy at L4-5. She has multiple areas of disc degeneration and disc space collapse with moderately severe multilevel foraminal narrowing. There is no evidence of acute process such as a new disc herniation or neoplasm or infection at present. Certainly given her current cardiac status she is not a candidate for any type of acute surgical intervention. I think once her cardiac situation allows she probably should be treated with a course of outpatient epidural steroid injections. She may follow up in my office in 2 weeks."     Chest pain      At this point she admits that there may be an anxiety component to her chest discomfort. No further ischemic workup is planned during this hospitalization. --we have asked psych to eval. Concerning prozac.   --hope to discharge today    SOB (shortness of breath)     BNP today is 450. Her chest x-ray was clear on admission. Her BNP was in the same range on April 14, 2016 when she had no clinical CHF. The patient feels that her symptoms may be related to  Brilinta. We will switch her to Plavix. Because her intervention was within the past 30 days, we will reload with 300 of Plavix.- this was done yesterday.  Symptoms now resolved.   Prozac (fluoxetine)/Plavix interaction.     It is documented that fluoxetine may decrease the effectiveness of Plavix.. This is through CYP2C19 inhibition. We cannot risk the  possibility that she will not have full Plavix effect as she has had a recent stent placed. Fluoxetine has been stopped. We are awaiting input today from psychiatry for recommendation for medication to help her that will not affect Plavix function.  Signed, Cecilie Kicks, NP  04/20/2016, 9:00 AM  [place] Pager [phone]  After 5 or weekends [phone] Patient seen and examined. I agree with the assessment and plan as detailed above. See also my additional thoughts below.   The patient seems much better today off Brilinta. She is now on Plavix. We are getting careful input from psychiatry to decide which medication she can take instead of fluoxetine (Prozac).  Dola Argyle, MD, [facility] 04/20/2016 10:35 AM

## 2016-04-20 NOTE — Progress Notes (Signed)
Patient given discharge instructions and all questions answered.  Pt. Discharged via wheelchair with all belongings.   

## 2016-04-20 NOTE — Progress Notes (Addendum)
Patient mentioned she received a phone call from someone stating that plavix and prozac shouldn't be taken together.  After following up with pharmacy, they stated there was no contraindications for the two medications.    1040: After talking with Dr. Ron Parker, there is a enzyme in prozac that decreases efficacy of plavix.  Prozac was not given to patient and order was d/c.

## 2016-04-21 ENCOUNTER — Telehealth: Payer: Self-pay | Admitting: Internal Medicine

## 2016-04-21 ENCOUNTER — Other Ambulatory Visit: Payer: Self-pay | Admitting: *Deleted

## 2016-04-21 NOTE — Telephone Encounter (Signed)
Pt called in and said that she just got out of the hospital, she had a heart attack.  She did say that while she was in the hospital, they did get her blood sugar under control, but she feels like her meter is not giving her accurate readings.  She would like to know what she needs to do about this.

## 2016-04-21 NOTE — Patient Outreach (Signed)
[place] [facility]) Care Management  04/21/2016  Janet Choi 06-08-1940 [id number]   RN attempted outreach call to pt today and spoke with pt who indicates she was very tired. RN introduced the [facility] services and purpose for today's call. Inquired it this was a convenient time to talk concerning her recent discharged from the hospital. Pt agreed to a call back for this conversation on Friday. RN confirmed a call back and requested pt to have her discharge orders and confirmed pt has all her medications and taking according to the recommendations. RN offered to review medications if she was having any questions or issues with her discharge medications. Pt again agreed to review this information on Friday. RN will follow up on Friday morning and completed the transition of care template and answer any question related to her recent discharge. Pt receptive and agreed to the scheduled call back.   Raina Mina, RN Care Management Coordinator [place] Office [phone]

## 2016-04-22 ENCOUNTER — Ambulatory Visit (INDEPENDENT_AMBULATORY_CARE_PROVIDER_SITE_OTHER): Payer: PPO | Admitting: Physician Assistant

## 2016-04-22 ENCOUNTER — Encounter: Payer: Self-pay | Admitting: Physician Assistant

## 2016-04-22 ENCOUNTER — Encounter (HOSPITAL_COMMUNITY): Payer: Self-pay | Admitting: Emergency Medicine

## 2016-04-22 ENCOUNTER — Emergency Department (HOSPITAL_COMMUNITY)
Admission: EM | Admit: 2016-04-22 | Discharge: 2016-04-22 | Disposition: A | Discharge status: Home / Self Care | Payer: PPO | Attending: Physician Assistant | Admitting: Physician Assistant

## 2016-04-22 ENCOUNTER — Emergency Department (HOSPITAL_COMMUNITY): Payer: PPO

## 2016-04-22 VITALS — BP 160/90 | HR 86 | Temp 97.0°F | Resp 16

## 2016-04-22 DIAGNOSIS — Z79899 Other long term (current) drug therapy: Secondary | ICD-10-CM | POA: Insufficient documentation

## 2016-04-22 DIAGNOSIS — R197 Diarrhea, unspecified: Secondary | ICD-10-CM | POA: Insufficient documentation

## 2016-04-22 DIAGNOSIS — I252 Old myocardial infarction: Secondary | ICD-10-CM | POA: Insufficient documentation

## 2016-04-22 DIAGNOSIS — R079 Chest pain, unspecified: Secondary | ICD-10-CM

## 2016-04-22 DIAGNOSIS — I1310 Hypertensive heart and chronic kidney disease without heart failure, with stage 1 through stage 4 chronic kidney disease, or unspecified chronic kidney disease: Secondary | ICD-10-CM | POA: Diagnosis not present

## 2016-04-22 DIAGNOSIS — Z7902 Long term (current) use of antithrombotics/antiplatelets: Secondary | ICD-10-CM | POA: Diagnosis not present

## 2016-04-22 DIAGNOSIS — Z955 Presence of coronary angioplasty implant and graft: Secondary | ICD-10-CM | POA: Insufficient documentation

## 2016-04-22 DIAGNOSIS — R1032 Left lower quadrant pain: Secondary | ICD-10-CM | POA: Diagnosis not present

## 2016-04-22 DIAGNOSIS — Z87891 Personal history of nicotine dependence: Secondary | ICD-10-CM | POA: Insufficient documentation

## 2016-04-22 DIAGNOSIS — Z96653 Presence of artificial knee joint, bilateral: Secondary | ICD-10-CM | POA: Insufficient documentation

## 2016-04-22 DIAGNOSIS — I2510 Atherosclerotic heart disease of native coronary artery without angina pectoris: Secondary | ICD-10-CM | POA: Insufficient documentation

## 2016-04-22 DIAGNOSIS — Z7982 Long term (current) use of aspirin: Secondary | ICD-10-CM | POA: Insufficient documentation

## 2016-04-22 DIAGNOSIS — E1122 Type 2 diabetes mellitus with diabetic chronic kidney disease: Secondary | ICD-10-CM | POA: Insufficient documentation

## 2016-04-22 DIAGNOSIS — Z794 Long term (current) use of insulin: Secondary | ICD-10-CM | POA: Insufficient documentation

## 2016-04-22 DIAGNOSIS — E1140 Type 2 diabetes mellitus with diabetic neuropathy, unspecified: Secondary | ICD-10-CM | POA: Diagnosis not present

## 2016-04-22 DIAGNOSIS — R112 Nausea with vomiting, unspecified: Secondary | ICD-10-CM | POA: Diagnosis not present

## 2016-04-22 DIAGNOSIS — R319 Hematuria, unspecified: Secondary | ICD-10-CM | POA: Insufficient documentation

## 2016-04-22 DIAGNOSIS — K5730 Diverticulosis of large intestine without perforation or abscess without bleeding: Secondary | ICD-10-CM | POA: Diagnosis not present

## 2016-04-22 DIAGNOSIS — N183 Chronic kidney disease, stage 3 (moderate): Secondary | ICD-10-CM | POA: Insufficient documentation

## 2016-04-22 LAB — COMPREHENSIVE METABOLIC PANEL
ALBUMIN: 4 g/dL (ref 3.5–5.0)
ALK PHOS: 75 U/L (ref 38–126)
ALT: 20 U/L (ref 14–54)
ANION GAP: 9 (ref 5–15)
AST: 22 U/L (ref 15–41)
BUN: 26 mg/dL — ABNORMAL HIGH (ref 6–20)
CALCIUM: 9.1 mg/dL (ref 8.9–10.3)
CHLORIDE: 106 mmol/L (ref 101–111)
CO2: 25 mmol/L (ref 22–32)
CREATININE: 1.26 mg/dL — AB (ref 0.44–1.00)
GFR calc non Af Amer: 41 mL/min — ABNORMAL LOW (ref >60)
GFR, EST AFRICAN AMERICAN: 47 mL/min — AB (ref >60)
GLUCOSE: 161 mg/dL — AB (ref 65–99)
Potassium: 4.4 mmol/L (ref 3.5–5.1)
SODIUM: 140 mmol/L (ref 135–145)
Total Bilirubin: 0.7 mg/dL (ref 0.3–1.2)
Total Protein: 6.8 g/dL (ref 6.5–8.1)

## 2016-04-22 LAB — I-STAT CG4 LACTIC ACID, ED: LACTIC ACID, VENOUS: 0.99 mmol/L (ref 0.5–1.9)

## 2016-04-22 LAB — URINALYSIS, ROUTINE W REFLEX MICROSCOPIC
BILIRUBIN URINE: NEGATIVE
Bacteria, UA: NONE SEEN
Glucose, UA: NEGATIVE mg/dL
KETONES UR: NEGATIVE mg/dL
Nitrite: NEGATIVE
PROTEIN: 30 mg/dL — AB
Specific Gravity, Urine: 1.018 (ref 1.005–1.030)
pH: 5 (ref 5.0–8.0)

## 2016-04-22 LAB — CBC
HCT: 36 % (ref 36.0–46.0)
HEMOGLOBIN: 12.1 g/dL (ref 12.0–15.0)
MCH: 29.1 pg (ref 26.0–34.0)
MCHC: 33.6 g/dL (ref 30.0–36.0)
MCV: 86.5 fL (ref 78.0–100.0)
Platelets: 255 10*3/uL (ref 150–400)
RBC: 4.16 MIL/uL (ref 3.87–5.11)
RDW: 13.4 % (ref 11.5–15.5)
WBC: 8.3 10*3/uL (ref 4.0–10.5)

## 2016-04-22 LAB — LIPASE, BLOOD: LIPASE: 27 U/L (ref 11–51)

## 2016-04-22 MED ORDER — IOPAMIDOL (ISOVUE-300) INJECTION 61%
30.0000 mL | Freq: Once | INTRAVENOUS | Status: AC
  Administered 2016-04-22: 30 mL via ORAL

## 2016-04-22 MED ORDER — ONDANSETRON HCL 4 MG/2ML IJ SOLN
4.0000 mg | Freq: Once | INTRAMUSCULAR | Status: AC
  Administered 2016-04-22: 4 mg via INTRAVENOUS
  Filled 2016-04-22: qty 2

## 2016-04-22 MED ORDER — LOSARTAN POTASSIUM 50 MG PO TABS
50.0000 mg | ORAL_TABLET | Freq: Once | ORAL | Status: AC
  Administered 2016-04-22: 50 mg via ORAL
  Filled 2016-04-22: qty 1

## 2016-04-22 MED ORDER — IOPAMIDOL (ISOVUE-300) INJECTION 61%
100.0000 mL | Freq: Once | INTRAVENOUS | Status: AC | PRN
  Administered 2016-04-22: 80 mL via INTRAVENOUS

## 2016-04-22 MED ORDER — LOSARTAN POTASSIUM 50 MG PO TABS
50.0000 mg | ORAL_TABLET | Freq: Once | ORAL | Status: DC
  Filled 2016-04-22: qty 1

## 2016-04-22 MED ORDER — ACETAMINOPHEN 325 MG PO TABS
650.0000 mg | ORAL_TABLET | Freq: Once | ORAL | Status: AC
  Administered 2016-04-22: 650 mg via ORAL
  Filled 2016-04-22: qty 2

## 2016-04-22 MED ORDER — IOPAMIDOL (ISOVUE-300) INJECTION 61%
INTRAVENOUS | Status: AC
  Filled 2016-04-22: qty 30

## 2016-04-22 MED ORDER — ONDANSETRON HCL 4 MG PO TABS: 4.0000 mg | ORAL_TABLET | Freq: Four times a day (QID) | ORAL | 0 refills | Status: DC

## 2016-04-22 MED ORDER — SODIUM CHLORIDE 0.9 % IJ SOLN
INTRAMUSCULAR | Status: AC
  Filled 2016-04-22: qty 50

## 2016-04-22 MED ORDER — SODIUM CHLORIDE 0.9 % IV BOLUS (SEPSIS)
1000.0000 mL | Freq: Once | INTRAVENOUS | Status: AC
  Administered 2016-04-22: 1000 mL via INTRAVENOUS

## 2016-04-22 MED ORDER — ONETOUCH ULTRA SYSTEM W/DEVICE KIT: 1.0000 | PACK | Freq: Once | 0 refills | Status: DC

## 2016-04-22 MED ORDER — IOPAMIDOL (ISOVUE-300) INJECTION 61%
INTRAVENOUS | Status: AC
  Filled 2016-04-22: qty 100

## 2016-04-22 NOTE — Discharge Instructions (Signed)
Your pain was likely due to a recent kidney stone being passed. I'm going to give you a prescription for some Zofran for nausea. He may take Imodium if her diarrhea continues. Please go home and take all your medications as prescribed. Please drink plenty of water and stay hydrated. Return to the ED if he develops worsening abdominal pain, fevers, urinary symptoms or for any other reason. Please follow-up with her primary care doctor at her appointment on Monday for further workup a CAT scan findings for vascular surgery and for your recent visit today.

## 2016-04-22 NOTE — ED Notes (Signed)
Patient aware that a urine sample is needed. Patient will notify staff when able to urinate.

## 2016-04-22 NOTE — ED Notes (Signed)
Nurse is in the room collecting labs on the patient

## 2016-04-22 NOTE — ED Triage Notes (Signed)
Pt reports LLQ pain, Hx diverticulitis. Also reports diarrhea and weakness. Sent from PCP for IV fluids and antibiotics. Alert and oriented x 4.

## 2016-04-22 NOTE — Telephone Encounter (Signed)
Let's send a prescription for another One Touch meter to her pharmacy

## 2016-04-22 NOTE — ED Provider Notes (Signed)
[facility] DEPT Provider Note   CSN: [id number] Arrival date & time: 04/22/16  1037     History   Chief Complaint Chief Complaint  Patient presents with  . Abdominal Pain    LLQ    HPI Janet Choi is a 75 y.o. female.  75 year old Caucasian female with a past medical history significant for diverticulitis, CKD, HTN, DM on insulin chronic low back pain, DJD, CAD, MI s/p PCI 04/16/2016 that presents to the ED today from her primary care doctor's office for left lower quadrant abdominal pain, nausea, diarrhea. States she developed LLQ pain yesterday. Patient states the pain is gradually worsened. She's been trying Norco at home for the pain with little relief. She also endorses nausea, emesis, diarrhea. Patient states that she has had 6-7 episodes of nonbloody watery diarrhea since yesterday. She also endorses 4-5 episodes of nonbloody, nonbilious emesis. She states she has had poor by mouth intake for the past day. Her primary care doctor today was worried about dehydration and diverticulitis and sent her to the ED for further evaluation for possible IV fluids and IV antibiotics. Patient has a history of diverticulitis in the past and states this feels very similar to it. She denies any fever, chills, melena, hematochezia. Patient states that she was just released from the hospital last week after having cardiac stents placed due to N STEMI. Patient also endorses chronic sciatic pain that she currently is being seen for by a neurosurgeion and has an appointment next week. Patient states that she has not taken her insulin or blood pressure medicine this morning due to nausea and emesis. She denies any headache, vision changes, chest pain, shortness of breath, lightheadedness, dizziness, urinary symptoms, numbness/tingling.      Past Medical History:  Diagnosis Date  . Anemia    hx (04/14/2016)  . Anxiety   . Arthritis    "severe in my back; hands; ankles" (04/14/2016)  . Basal cell  carcinoma    "several burned off; one cut off"  . CAD in native artery    a. NSTEMI 04/2016 - s/p DES toLAD and LCx. PCI to LCx notable for microembolization during cath.  . Chest pain- reslved with stopping Brilinta now on Plaix 04/16/2016  . Chronic lower back pain   . CKD (chronic kidney disease), stage III   . Depression   . DJD (degenerative joint disease)   . Family history of adverse reaction to anesthesia    "half-sister used to get real sick" (04/14/2016)  . GERD (gastroesophageal reflux disease)   . History of gout   . Hyperlipidemia   . Hypertension   . IBS (irritable bowel syndrome)   . Ischemic cardiomyopathy    a. 04/2016: EF 40-50% by cath, 50-55% +WMA by echo.  . 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 ([place]) ~ 2015  . Morbid obesity ([place])   . NSTEMI (non-ST elevated myocardial infarction) ([place]) 04/14/2016  . OSA on CPAP   . Peripheral neuropathy ([place])   . RLS (restless legs syndrome)   . Spinal stenosis   . Type II diabetes mellitus ([place])   . Vitamin D deficiency     Patient Active Problem List   Diagnosis Date Noted  . Elevated troponin   . SOB (shortness of breath) 04/19/2016  . CAD in native artery 04/19/2016  . Anxiety 04/19/2016  . Chest pain- reslved with stopping Brilinta now on Plaix 04/16/2016  . Other chest pain   . Coronary artery disease involving native coronary  artery of native heart without angina pectoris   . Hypertensive heart disease without heart failure   . S/P coronary artery stent placement   . NSTEMI (non-ST elevated myocardial infarction) ([place]) 04/14/2016  . CKD (chronic kidney disease), stage III 04/14/2016  . Back pain 04/14/2016  . Iron deficiency anemia 08/15/2015  . Hypoxia 08/06/2015  . OSA on CPAP 04/16/2015  . BMI 38.0-38.9,adult 04/14/2015  . Medicare annual wellness visit, subsequent 04/14/2015  . RLS (restless legs syndrome) 03/20/2014  . Poorly controlled type 2 diabetes mellitus with peripheral neuropathy ([place])  03/20/2014  . Compliance poor 11/28/2013  . Medication management 09/12/2013  . Morbid obesity 05/14/2013  . Vitamin D deficiency 05/14/2013  . 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 Hyperlipidemia 02/24/2009  . Essential hypertension 02/24/2009  . Diverticulosis of large intestine 02/24/2009  . History of colonic polyps 02/24/2009    Past Surgical History:  Procedure Laterality Date  . APPENDECTOMY    . BACK SURGERY    . BASAL CELL CARCINOMA EXCISION Left    leg  . CARDIAC CATHETERIZATION N/A 04/15/2016   Procedure: Left Heart Cath and Coronary Angiography;  Surgeon: Belva Crome, MD;  Location: [facility];  Service: Cardiovascular;  Laterality: N/A;  . CARDIAC CATHETERIZATION N/A 04/15/2016   Procedure: Coronary Stent Intervention;  Surgeon: Belva Crome, MD;  Location: [facility];  Service: Cardiovascular;  Laterality: N/A;  Mid LAD Mid CFX  . CATARACT EXTRACTION, BILATERAL Bilateral   . JOINT REPLACEMENT    . LUMBAR DISC SURGERY  X 2  . MELANOMA EXCISION Left    "towards the back of my neck"  . SHOULDER ARTHROSCOPY W/ ROTATOR CUFF REPAIR Left   . SHOULDER OPEN ROTATOR CUFF REPAIR Right   . TOTAL KNEE ARTHROPLASTY Bilateral     OB History    No data available       Home Medications    Prior to Admission medications   Medication Sig Start Date End Date Taking? Authorizing Provider  acetaminophen (TYLENOL) 325 MG tablet Take 2 tablets (650 mg total) by mouth every 4 (four) hours as needed for headache or mild pain. 04/20/16   Isaiah Serge, NP  ALPRAZolam Duanne Moron) 0.25 MG tablet Take 1 tablet (0.25 mg total) by mouth 3 (three) times daily as needed for anxiety. 04/20/16   Isaiah Serge, NP  aspirin EC 81 MG EC tablet Take 1 tablet (81 mg total) by mouth daily. 04/21/16   Isaiah Serge, NP  atorvastatin (LIPITOR) 80 MG tablet Take 1 tablet (80 mg total) by mouth daily at 6 PM. 04/20/16   Erlene Quan, PA-C  Blood Glucose Monitoring Suppl (ONE  TOUCH ULTRA SYSTEM KIT) w/Device KIT 1 kit by Does not apply route once. 04/22/16 04/22/16  Philemon Kingdom, MD  clopidogrel (PLAVIX) 75 MG tablet Take 1 tablet (75 mg total) by mouth daily. 04/21/16   Isaiah Serge, NP  FLECTOR 1.3 % PTCH APPLY 1 PATCH EVERY 12 HOURS AS NEEDED THEN TAKE OFF FOR 12 HOURS 10/03/15   Historical Provider, MD  gabapentin (NEURONTIN) 600 MG tablet Take 300 mg by mouth at bedtime.    Historical Provider, MD  HYDROcodone-acetaminophen (NORCO/VICODIN) 5-325 MG tablet Take 1 tablet by mouth every 6 (six) hours as needed for moderate pain.  04/08/16   Historical Provider, MD  insulin NPH-regular Human (NOVOLIN 70/30) (70-30) 100 UNIT/ML injection Inject 40-60 Units into the skin See admin instructions. 40 units in  the morning and 60 units at bedtime     Historical Provider, MD  losartan (COZAAR) 50 MG tablet Take 1 tablet (50 mg total) by mouth daily. 04/16/16   Jennifer Chahn-Yang Choi, DO  nitroGLYCERIN (NITROSTAT) 0.4 MG SL tablet Place 1 tablet (0.4 mg total) under the tongue every 5 (five) minutes x 3 doses as needed for chest pain. 04/20/16   Isaiah Serge, NP  rOPINIRole (REQUIP) 3 MG tablet Take 1 tablet (3 mg total) by mouth 4 (four) times daily. Take 1 tablet 4 x / day for Restless Legs 04/20/16   Isaiah Serge, NP  tiZANidine (ZANAFLEX) 4 MG tablet Take 4 mg by mouth every 6 (six) hours as needed for muscle spasms. MUSCLE SPASM  04/06/16   Historical Provider, MD    Family History Family History  Problem Relation Age of Onset  . Heart disease Mother   . Kidney disease Father   . Colon cancer Neg Hx   . Colon polyps Neg Hx   . Esophageal cancer Neg Hx   . Pancreatic cancer Neg Hx   . Stomach cancer Neg Hx   . Liver disease Neg Hx   . Diabetes Neg Hx     Social History Social History  Substance Use Topics  . Smoking status: Former Smoker    Packs/day: 2.50    Years: 25.00    Types: Cigarettes    Quit date: 05/11/1983  . Smokeless tobacco: Never Used    . Alcohol use No     Allergies   Brilinta [ticagrelor]; Lyrica [pregabalin]; Aspirin; Crestor [rosuvastatin]; Lipitor [atorvastatin]; Minocycline hcl; Oruvail [ketoprofen]; Tricor [fenofibrate]; Vasotec [enalaprilat]; Victoza [liraglutide]; and Zinc   Review of Systems Review of Systems  Constitutional: Positive for chills. Negative for fever.  HENT: Negative for ear pain and sore throat.   Eyes: Negative for pain and visual disturbance.  Respiratory: Negative for cough and shortness of breath.   Cardiovascular: Negative for chest pain and palpitations.  Gastrointestinal: Positive for abdominal pain (LLQ), diarrhea, nausea and vomiting. Negative for blood in stool.  Genitourinary: Negative for dysuria, frequency, hematuria and urgency.  Musculoskeletal: Positive for back pain (chronic sciatic pain). Negative for arthralgias.  Skin: Negative for color change and rash.  Neurological: Negative for dizziness, seizures, syncope, weakness, light-headedness and headaches.  All other systems reviewed and are negative.    Physical Exam Updated Vital Signs BP (!) 152/107   Pulse 86   Temp 98.7 F (37.1 C) (Oral)   Resp 18   SpO2 95%   Physical Exam  Constitutional: She is oriented to person, place, and time. She appears well-developed and well-nourished. No distress.  Patient appears to be uncomfortable sitting on stretcher.  HENT:  Head: Normocephalic and atraumatic.  Mouth/Throat: Oropharynx is clear and moist.  Mucous membranes are moist.  Eyes: Conjunctivae are normal. Pupils are equal, round, and reactive to light. Right eye exhibits no discharge. Left eye exhibits no discharge. No scleral icterus.  Neck: Normal range of motion. Neck supple. No thyromegaly present.  Cardiovascular: Normal rate, regular rhythm, normal heart sounds and intact distal pulses.  Exam reveals no gallop and no friction rub.   No murmur heard. Patient is not tachycardic. She is hypertensive patient  states she has not taken her blood pressure medicine this a.m.  Pulmonary/Chest: Effort normal and breath sounds normal. No respiratory distress.  The patient is not hypoxic or tachypnea.  Abdominal: Soft. Bowel sounds are normal. She exhibits no distension. There is tenderness  in the left lower quadrant. There is no rigidity, no rebound, no guarding, no CVA tenderness and negative Murphy's sign.  Musculoskeletal: Normal range of motion.  TTP to the right gluteus that is consistent with patient's chronic sciatic pain. No midline C-spine, L-spine, T-spine tenderness. Full range of motion. Strength 5 out of 5 in lower extremities. Radial and DP pulses are 2+ bilaterally.  Lymphadenopathy:    She has no cervical adenopathy.  Neurological: She is alert and oriented to person, place, and time.  Skin: Skin is warm and dry. Capillary refill takes less than 2 seconds.  Nursing note and vitals reviewed.    ED Treatments / Results  Labs (all labs ordered are listed, but only abnormal results are displayed) Labs Reviewed  COMPREHENSIVE METABOLIC PANEL - Abnormal; Notable for the following:       Result Value   Glucose, Bld 161 (*)    BUN 26 (*)    Creatinine, Ser 1.26 (*)    GFR calc non Af Amer 41 (*)    GFR calc Af Amer 47 (*)    All other components within normal limits  URINALYSIS, ROUTINE W REFLEX MICROSCOPIC - Abnormal; Notable for the following:    APPearance CLOUDY (*)    Hgb urine dipstick LARGE (*)    Protein, ur 30 (*)    Leukocytes, UA TRACE (*)    Squamous Epithelial / LPF 0-5 (*)    All other components within normal limits  LIPASE, BLOOD  CBC  I-STAT CG4 LACTIC ACID, ED    EKG  EKG Interpretation None       Radiology Ct Abdomen Pelvis W Contrast  Result Date: 04/22/2016 CLINICAL DATA:  Lower abdominal pain, primarily left-sided. Diarrhea and weakness. EXAM: CT ABDOMEN AND PELVIS WITH CONTRAST TECHNIQUE: Multidetector CT imaging of the abdomen and pelvis was  performed using the standard protocol following bolus administration of intravenous contrast. Oral contrast was also administered. CONTRAST:  80 mL ISOVUE-300 IOPAMIDOL (ISOVUE-300) INJECTION 61% COMPARISON:  None. FINDINGS: Lower chest: There is patchy atelectasis in both lung bases. There is a focal hiatal hernia. There are foci of coronary artery calcification as well as the stent in the left anterior descending coronary artery. Hepatobiliary: No focal liver lesions are evident. Gallbladder wall is not appreciably thickened. There is no biliary duct dilatation. Pancreas: No pancreatic mass or inflammatory focus. Spleen: No splenic lesions are evident. Adrenals/Urinary Tract: The right adrenal appears normal. There is slight generalized left adrenal hypertrophy. There is a 1.5 x 1.3 cm cyst in the lower pole left kidney. No other renal mass evident. On the left, there is edema with stranding throughout the perinephric fascia. Similar changes are not seen on the right. There is mild hydronephrosis on the left. No hydronephrosis on the right. There is no evident renal calculus on either side. There is no appreciable ureteral calculus on either side. There is a 1 mm calculus in the posterior midportion urinary bladder. Urinary bladder wall is not appreciably thickened. Stomach/Bowel: There are multiple sigmoid diverticula without diverticulitis. There is no appreciable bowel wall or adjacent to bowel mesenteric thickening. No bowel obstruction. No free air or portal venous air. Vascular/Lymphatic: There are scattered foci of atherosclerotic calcification in the aorta and common iliac arteries. There is extensive calcification in the superior mesenteric artery proximally with what appears to be high-grade obstruction of this vessel. The celiac artery appears widely patent. Inferior mesenteric artery is patent as well. No adenopathy is appreciable in the abdomen or  pelvis. Reproductive: Uterus is anteverted. There is  no pelvic mass or pelvic fluid collection. Other: Appendix is absent. There is no ascites or abscess in the abdomen or pelvis. There is a small ventral hernia containing only fat. Musculoskeletal: There is extensive arthropathy throughout the lumbar spine. There is moderate spinal stenosis at L3-4 and L4-5 due to bony overgrowth in diffuse disc protrusion. There are no blastic or lytic bone lesions. There is no intramuscular or abdominal wall lesions evident. IMPRESSION: There is a 1 mm calcification in the posterior urinary bladder. There is mild hydronephrosis on the left moderate left perinephric edema and stranding in the perinephric fascia. Suspect recent calculus passage into the bladder on the left. It should be noted that a degree of pyelonephritis on the left cannot be excluded. No renal abscess is seen. There is a cyst in the lower pole left kidney. Extensive sigmoid diverticulosis without diverticulitis. No bowel obstruction. No abscess. Appendix absent. Mild left adrenal hypertrophy. Extensive arthropathy in the lumbar spine with moderate spinal stenosis at L3-4 and L4-5, multifactorial in etiology. Aortoiliac atherosclerosis. 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 Other major mesenteric vessels appear patent. No evidence of bowel pneumatosis. Small ventral hernia containing only fat. Foci of coronary artery calcification noted. There is a focal hiatal hernia. Electronically Signed   By: Lowella Grip III M.D.   On: 04/22/2016 13:20    Procedures Procedures (including critical care time)  Medications Ordered in ED Medications - No data to display   Initial Impression / Assessment and Plan / ED Course  I have reviewed the triage vital signs and the nursing notes.  Pertinent labs & imaging results that were available during my care of the patient were reviewed by me and considered in my medical decision making (see chart for  details).  Clinical Course   Patient presents to the ED today with history of diverticulitis and complaint of left lower quadrant pain, diarrhea, nausea, emesis. She states this feels similar to her diverticulitis in the past. We'll obtain labs at this time including CBC, CMP, lipase, lactic acid. We will give IV fluids, Zofran, Tylenol. Patient states she does not want narcotic pain medicine. Given patient's LLQ tenderness in the left lower quadrant and prior diverticulitis we'll obtain CAT scan of abdomen and pelvis. Patient has not taken her blood pressure medicine or insulin this morning. Blood pressure is slightly elevated in the ED. We'll await blood sugar and treat accordingly. Patient's labs are unremarkable. Creatinine is at baseline. No leukocytosis noted. Patient is afebrile and not tachycardic. She is slightly hypertensive due to not taking her BP meds this morning. She was given her home dose of blood pressure medicine. Patient sugar was 161. Urine shows large amount of hemoglobin, trace leukocytes and protein. No bacteria, nitrites were seen. CT scan of abdomen and pelvis revealed a 1 mm calcification in the urinary bladder. With mild left-sided hydronephrosis. Likely due to a recently passed kidney stone given the patient's blood in her urine. Radiologist notes possible pyelonephritis however patient has no CVA tenderness, no bacteria in urine and no urinary symptoms. She is afebrile. Patient states the pain has resolved since arriving to the ED. Diverticulosis noted without any acute diverticulitis. Pain and nausea likely due to recent passed kidney stone. Diarrhea I feel is related to viral gastroenteritis. I have encouraged him to medical treatment for diarrhea with Imodium and increasing po fluid intake. I have given a prescription for Zofran for nausea. Patient  states the pain has resolved and she does not want any pain medicine to go home with. 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 Patient with known coronary artery disease. She has no generalized abdominal pain and is not associated with postprandial ingestion. She has a follow-up appointment with her PCP on Monday and I have given her the CAT scan results to follow-up for possible referral to vascular surgery. I have given her strict return precautions. Pt is hemodynamically stable, in NAD, & able to ambulate in the ED. Pain has been managed & has no complaints prior to dc. Pt is comfortable with above plan and is stable for discharge at this time. All questions were answered prior to disposition. Strict return precautions for f/u to the ED were discussed. Patient was seen and examined by Dr. Thomasene Lot who is agreeable with the above plan. Patient feels stable for discharge.   Final Clinical Impressions(s) / ED Diagnoses   Final diagnoses:  LLQ pain  Hematuria, unspecified type  Nausea vomiting and diarrhea    New Prescriptions New Prescriptions   ONDANSETRON (ZOFRAN) 4 MG TABLET    Take 1 tablet (4 mg total) by mouth every 6 (six) hours.     Doristine Devoid, PA-C 04/22/16 [place], MD 04/25/16 [id number]

## 2016-04-22 NOTE — Progress Notes (Addendum)
Hospital follow up  Assessment and Plan: [place]  hospital visit follow up for chest pain. This has improved- continue plavix, may need to increase losartan to '100mg'$  versus adding BB for anxiety as well Diverticulitis rule out obstruction/mesiteric ischemia/enteritis Patient with weakness, pain with movement, pain on left side with pushing on the right side Don't feel comfortable sending home at this time  Will send to the ER for possible IV fluids/CT AB- may need IV antibiotics  Hospital discharge meds were reviewed, and reconciled with the patient.   There are no discontinued medications.  Over 40 minutes of exam, counseling, chart review, and complex, high/moderate level critical decision making was performed this visit.     HPI 75 y.o.female presents for follow up for transition from recent hospitalization. Admit date to the hospital was 04/16/16, patient was discharged from the hospital on 04/20/16 and our clinical staff contacted the office the day after discharge to set up a follow up appointment, patient was admitted for:  Chest pain.  Patient had NSTEMI s/p DES stent to LAD, LCX, ICM on 12/06, discharged 12/07, but readmitted 12/08 for chest pain, she had negative work up and her chest pain resolved when Brillinta was swtiched to plavix.   She states she had spicy spaghetti yesterday then 3 hours after that she started to have diarrhea, left side pain, nausea, chills, fever, she had pain all night, took 1/2 of hydrocodone at 3 AM. She has had weakness, pain with movement.   . No SOB, no CP. BP has been elevated since NSTEMI.  She is off the prozac, lasix, potassium 20, brilinta and she is on plavix, xanax 0.25 for  Blood pressure (!) 160/90, pulse 86, temperature 97 F (36.1 C), resp. rate 16, SpO2 96 %.    Images while in the hospital: Dg Chest Portable 1 View  Result Date: 04/16/2016 CLINICAL DATA:  Chest pain. EXAM: PORTABLE CHEST 1 VIEW COMPARISON:  Radiographs and CT 2  days prior 04/14/2016 FINDINGS: Stable cardiomegaly and mediastinal contours. Unchanged left basal atelectasis. Exam is otherwise unchanged. No new focal airspace disease, pleural fluid, pulmonary edema or pneumothorax. Unchanged osseous structures. IMPRESSION: Stable cardiomegaly without acute abnormality. Electronically Signed   By: Jeb Levering M.D.   On: 04/16/2016 20:29    Past Medical History:  Diagnosis Date  . Anemia    hx (04/14/2016)  . Anxiety   . Arthritis    "severe in my back; hands; ankles" (04/14/2016)  . Basal cell carcinoma    "several burned off; one cut off"  . CAD in native artery    a. NSTEMI 04/2016 - s/p DES toLAD and LCx. PCI to LCx notable for microembolization during cath.  . Chest pain- reslved with stopping Brilinta now on Plaix 04/16/2016  . Chronic lower back pain   . CKD (chronic kidney disease), stage III   . Depression   . DJD (degenerative joint disease)   . Family history of adverse reaction to anesthesia    "half-sister used to get real sick" (04/14/2016)  . GERD (gastroesophageal reflux disease)   . History of gout   . Hyperlipidemia   . Hypertension   . IBS (irritable bowel syndrome)   . Ischemic cardiomyopathy    a. 04/2016: EF 40-50% by cath, 50-55% +WMA by echo.  . 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 ([place]) ~ 2015  . Morbid obesity ([place])   . NSTEMI (non-ST elevated myocardial infarction) ([place]) 04/14/2016  . OSA on CPAP   .  Peripheral neuropathy ([place])   . RLS (restless legs syndrome)   . Spinal stenosis   . Type II diabetes mellitus ([place])   . Vitamin D deficiency      Allergies  Allergen Reactions  . Brilinta [Ticagrelor] Shortness Of Breath    Chest pain  . Lyrica [Pregabalin] Other (See Comments)    Blurred vision  . Aspirin Other (See Comments)    Large doses per patient  . Crestor [Rosuvastatin] Other (See Comments)    Leg cramps  . Lipitor [Atorvastatin] Other (See Comments)    Leg cramps  . Minocycline Hcl Other  (See Comments)    REACTION: Reaction not known  . Oruvail [Ketoprofen] Other (See Comments)  . Tricor [Fenofibrate] Other (See Comments)  . Vasotec [Enalaprilat] Other (See Comments)  . Victoza [Liraglutide] Other (See Comments)  . Zinc Other (See Comments)      Current Outpatient Prescriptions on File Prior to Visit  Medication Sig Dispense Refill  . acetaminophen (TYLENOL) 325 MG tablet Take 2 tablets (650 mg total) by mouth every 4 (four) hours as needed for headache or mild pain.    Marland Kitchen ALPRAZolam (XANAX) 0.25 MG tablet Take 1 tablet (0.25 mg total) by mouth 3 (three) times daily as needed for anxiety. 30 tablet 0  . aspirin EC 81 MG EC tablet Take 1 tablet (81 mg total) by mouth daily.    Marland Kitchen atorvastatin (LIPITOR) 80 MG tablet Take 1 tablet (80 mg total) by mouth daily at 6 PM. 90 tablet 3  . Blood Glucose Monitoring Suppl (ONE TOUCH ULTRA SYSTEM KIT) w/Device KIT 1 kit by Does not apply route once. 1 each 0  . clopidogrel (PLAVIX) 75 MG tablet Take 1 tablet (75 mg total) by mouth daily. 30 tablet 6  . FLECTOR 1.3 % PTCH APPLY 1 PATCH EVERY 12 HOURS AS NEEDED THEN TAKE OFF FOR 12 HOURS  3  . gabapentin (NEURONTIN) 600 MG tablet Take 300 mg by mouth at bedtime.    Marland Kitchen HYDROcodone-acetaminophen (NORCO/VICODIN) 5-325 MG tablet Take 1 tablet by mouth every 6 (six) hours as needed for moderate pain.   0  . insulin NPH-regular Human (NOVOLIN 70/30) (70-30) 100 UNIT/ML injection Inject 40-60 Units into the skin See admin instructions. 40 units in the morning and 60 units at bedtime     . losartan (COZAAR) 50 MG tablet Take 1 tablet (50 mg total) by mouth daily. 30 tablet 0  . nitroGLYCERIN (NITROSTAT) 0.4 MG SL tablet Place 1 tablet (0.4 mg total) under the tongue every 5 (five) minutes x 3 doses as needed for chest pain. 25 tablet 4  . rOPINIRole (REQUIP) 3 MG tablet Take 1 tablet (3 mg total) by mouth 4 (four) times daily. Take 1 tablet 4 x / day for Restless Legs    . tiZANidine (ZANAFLEX) 4 MG  tablet Take 4 mg by mouth every 6 (six) hours as needed for muscle spasms. MUSCLE SPASM   0   No current facility-administered medications on file prior to visit.     ROS: all negative except above.   Physical Exam: There were no vitals filed for this visit. BP (!) 160/90   Pulse 86   Temp 97 F (36.1 C)   Resp 16   SpO2 96%  Constitutional: She is oriented to person, place, and time. She is obese, in distress.  HENT:  Head: Normocephalic.  Mouth/Throat: Oropharynx is clear and moist. No oropharyngeal exudate.  Eyes: Conjunctivae are normal. No scleral icterus.  Neck: Normal range of motion. Neck supple. No JVD present. No thyromegaly present.  Cardiovascular: Normal rate, regular rhythm and intact distal pulses.  Exam reveals no gallop and no friction rub.   No murmur heard. Pulmonary/Chest: Effort normal and breath sounds normal. No respiratory distress. She has no wheezes. She has no rales. She exhibits no tenderness.  Abdominal: Distended, decreased bowel sounds, LLQ pain with slight rebound, + heel tap, + pain on LLQ with palpation of RLQ  Musculoskeletal: Normal range of motion.  Lymphadenopathy:    She has no cervical adenopathy.  Neurological: She is alert and oriented to person, place, and time. No cranial nerve deficit. Coordination normal.  Skin: Skin is warm and dry. No rash noted. She is not diaphoretic. No erythema. No pallor.   Vicie Mutters, PA-C 10:19 AM [facility]

## 2016-04-22 NOTE — Telephone Encounter (Signed)
Done. Called to notify patient sent Rx for meter to pharmacy. No answer.

## 2016-04-23 ENCOUNTER — Encounter: Payer: Self-pay | Admitting: *Deleted

## 2016-04-23 ENCOUNTER — Other Ambulatory Visit: Payer: Self-pay | Admitting: *Deleted

## 2016-04-23 NOTE — Patient Outreach (Signed)
[facility]) Care Management  04/23/2016  Janet Choi 1940-08-26 [id number]   RN reintroduced the [facility] services and the purpose of today's follow up call (pt receptive). RN able to completed the transition of care template and the initial assessment. Pt very receptive with today's call back and express her medical history. Pt states she has a clear understanding of her recent hospitalizations and aware of all her upcoming medical appointments. Reports an recent ED visit due to kidney stones that resulted in some back pain. States the pain is much better however a scan revealed some plaque found in her arteries. Reports the CT scan showed plaque and she will await the referral to a cardiovascular surgeon as recommended for a possible appointment. Pt reports she has an office visit with her primary on Monday and a neurosurgeon on Tuesday. Pt states she has a very supportive family and sufficient transportation when needed. Pt states she works very closely with her church and very familiar with the available community resources if she is in need of any community services when discussed today. RN also offered further one-on-one consultation with a community home visit however pt opt to decline indicating she has a very good support system and recovering very well at this time. Pt states she will be working on her A.D. With her family member and indicated she has all the paperwork to complete this process (decline additional information concerning Living Will and HCPOA).  Pt works a part time job and currently having her work brought to her home. Pt doing very well and has opted to discontinue ongoing transition of care contacts at this time however appreciative for the follow up calls. RN has informed pt that Dr. Melford Aase will be notified that she has opt to decline all of the [facility] services at this time ( pt has agreed). No further inquired or request at this time. Case will be closed.  Patient  was recently discharged from hospital and all medications have been reviewed.  Raina Mina, RN Care Management Coordinator [place] Office [phone]

## 2016-04-26 ENCOUNTER — Ambulatory Visit (INDEPENDENT_AMBULATORY_CARE_PROVIDER_SITE_OTHER): Payer: PPO | Admitting: Physician Assistant

## 2016-04-26 ENCOUNTER — Encounter: Payer: Self-pay | Admitting: Internal Medicine

## 2016-04-26 ENCOUNTER — Ambulatory Visit (INDEPENDENT_AMBULATORY_CARE_PROVIDER_SITE_OTHER): Payer: PPO | Admitting: Internal Medicine

## 2016-04-26 ENCOUNTER — Encounter: Payer: Self-pay | Admitting: Physician Assistant

## 2016-04-26 VITALS — BP 130/50 | HR 75 | Ht 63.0 in | Wt 227.0 lb

## 2016-04-26 VITALS — BP 148/70 | HR 80 | Temp 98.2°F | Resp 18 | Ht 62.5 in | Wt 227.0 lb

## 2016-04-26 DIAGNOSIS — I10 Essential (primary) hypertension: Secondary | ICD-10-CM | POA: Diagnosis not present

## 2016-04-26 DIAGNOSIS — I771 Stricture of artery: Secondary | ICD-10-CM

## 2016-04-26 DIAGNOSIS — I214 Non-ST elevation (NSTEMI) myocardial infarction: Secondary | ICD-10-CM | POA: Diagnosis not present

## 2016-04-26 DIAGNOSIS — R0902 Hypoxemia: Secondary | ICD-10-CM | POA: Diagnosis not present

## 2016-04-26 DIAGNOSIS — E1122 Type 2 diabetes mellitus with diabetic chronic kidney disease: Secondary | ICD-10-CM

## 2016-04-26 DIAGNOSIS — N183 Chronic kidney disease, stage 3 unspecified: Secondary | ICD-10-CM

## 2016-04-26 DIAGNOSIS — Z9989 Dependence on other enabling machines and devices: Secondary | ICD-10-CM

## 2016-04-26 DIAGNOSIS — E559 Vitamin D deficiency, unspecified: Secondary | ICD-10-CM

## 2016-04-26 DIAGNOSIS — E2839 Other primary ovarian failure: Secondary | ICD-10-CM

## 2016-04-26 DIAGNOSIS — E1142 Type 2 diabetes mellitus with diabetic polyneuropathy: Secondary | ICD-10-CM | POA: Diagnosis not present

## 2016-04-26 DIAGNOSIS — G4733 Obstructive sleep apnea (adult) (pediatric): Secondary | ICD-10-CM | POA: Diagnosis not present

## 2016-04-26 DIAGNOSIS — K551 Chronic vascular disorders of intestine: Secondary | ICD-10-CM

## 2016-04-26 DIAGNOSIS — E782 Mixed hyperlipidemia: Secondary | ICD-10-CM | POA: Diagnosis not present

## 2016-04-26 DIAGNOSIS — E785 Hyperlipidemia, unspecified: Secondary | ICD-10-CM

## 2016-04-26 DIAGNOSIS — Z23 Encounter for immunization: Secondary | ICD-10-CM | POA: Diagnosis not present

## 2016-04-26 DIAGNOSIS — I119 Hypertensive heart disease without heart failure: Secondary | ICD-10-CM | POA: Diagnosis not present

## 2016-04-26 DIAGNOSIS — R079 Chest pain, unspecified: Secondary | ICD-10-CM | POA: Diagnosis not present

## 2016-04-26 DIAGNOSIS — E1165 Type 2 diabetes mellitus with hyperglycemia: Secondary | ICD-10-CM

## 2016-04-26 DIAGNOSIS — I2510 Atherosclerotic heart disease of native coronary artery without angina pectoris: Secondary | ICD-10-CM | POA: Diagnosis not present

## 2016-04-26 MED ORDER — ROPINIROLE HCL 3 MG PO TABS: 3.0000 mg | ORAL_TABLET | Freq: Four times a day (QID) | ORAL | 1 refills | Status: DC

## 2016-04-26 NOTE — Progress Notes (Signed)
Complete Physical  Assessment and Plan:   1. Estrogen deficiency -needs updated DEXA for screening for osteoporosis - DG Bone Density; Future  2. CAD in native artery -following with Cardiology -now on statin -recommend tight control of BP, slightly elevated today have the option of going up on losartan but will monitor at home -patient to call if consistently 150/90 or higher  3. Essential hypertension -see above -dash diet -needs slow introduction to cardiac rehab  4. Hypertensive heart disease without heart failure -recent NSTEMI -cont BP control -cont statin -cont BS control  5. NSTEMI (non-ST elevated myocardial infarction) ([place]) -2 drug elluding stents placed -cont plavix  6. OSA on CPAP -cont use of CPAP machine  7. Hypoxia -cont CPAP machine  8. Poorly controlled type 2 diabetes mellitus with peripheral neuropathy ([place]) -follows with Dr. Renne Crigler -has appointment in January -will defer to endocrine for diabetic medications and testing  9. Controlled type 2 diabetes mellitus with stage 3 chronic kidney disease, with long-term current use of insulin ([place]) -see above  10. CKD (chronic kidney disease), stage III -cont monitoring BMET -most recently checked in hospital -at baseline  11. Chest pain, unspecified type -resolved -secondary to NSTEMI  12. Hyperlipidemia -cont meds -monitor lipid panel -most recently checked by Kerin Ransom PA-C  13. Vitamin D deficiency -Cont Vit D  14. Need for prophylactic vaccination and inoculation against influenza  - Flu Vaccine QUAD with presevative   Discussed med's effects and SE's. Screening labs and tests as requested with regular follow-up as recommended.  HPI  75 y.o. female  presents for a complete physical.  Her blood pressure has been controlled at home, today [name] is BP: (!) 148/70.  She does not workout. She denies chest pain, shortness of breath, dizziness.   She is on cholesterol medication  and denies myalgias. Her cholesterol is not at goal. The cholesterol last visit was:  Lab Results  Component Value Date   CHOL 251 (H) 04/17/2016   HDL 38 (L) 04/17/2016   LDLCALC 145 (H) 04/17/2016   TRIG 341 (H) 04/17/2016   CHOLHDL 6.6 04/17/2016  .  She has been working on diet and exercise for prediabetes, she is on bASA, she is on ACE/ARB and denies foot ulcerations, hyperglycemia, hypoglycemia , increased appetite, nausea, paresthesia of the feet, polydipsia, polyuria, visual disturbances, vomiting and weight loss. Last A1C in the office was:  Lab Results  Component Value Date   HGBA1C 10.3 (H) 04/14/2016    Patient is on Vitamin D supplement.   Lab Results  Component Value Date   VD25OH 33 10/21/2015     Patient reports that she is supposed to see Dr. [place] Gammon tomorrow for her back.  She reports that she is likely not a surgery candidate because she can't come off the plavix right now.  She reports that the pinching sensation of the back is slightly improved.  She is not taking the hydrocodone regularly.  She is now doing just tylenol.  She is taking zanaflex and gabapentin.  She only takes the gabapentin at night.  She takes the muscle relaxer BID.    Current Medications:  Current Outpatient Prescriptions on File Prior to Visit  Medication Sig Dispense Refill  . acetaminophen (TYLENOL) 325 MG tablet Take 2 tablets (650 mg total) by mouth every 4 (four) hours as needed for headache or mild pain.    Marland Kitchen ALPRAZolam (XANAX) 0.25 MG tablet Take 0.125-0.25 mg by mouth 3 (three) times daily as needed  for anxiety.    Marland Kitchen aspirin EC 81 MG EC tablet Take 1 tablet (81 mg total) by mouth daily.    Marland Kitchen atorvastatin (LIPITOR) 80 MG tablet Take 1 tablet (80 mg total) by mouth daily at 6 PM. 90 tablet 3  . clopidogrel (PLAVIX) 75 MG tablet Take 1 tablet (75 mg total) by mouth daily. 30 tablet 6  . diclofenac (FLECTOR) 1.3 % PTCH Place 1 patch onto the skin daily as needed (for pain).     Marland Kitchen gabapentin  (NEURONTIN) 600 MG tablet Take 300 mg by mouth at bedtime.    Marland Kitchen HYDROcodone-acetaminophen (NORCO/VICODIN) 5-325 MG tablet Take 0.5-1 tablets by mouth every 6 (six) hours as needed for moderate pain.   0  . insulin NPH-regular Human (NOVOLIN 70/30) (70-30) 100 UNIT/ML injection Inject 40-60 Units into the skin 2 (two) times daily. Pt uses 40 units in the morning and 60 units at bedtime.    Marland Kitchen losartan (COZAAR) 50 MG tablet Take 1 tablet (50 mg total) by mouth daily. 30 tablet 0  . nitroGLYCERIN (NITROSTAT) 0.4 MG SL tablet Place 1 tablet (0.4 mg total) under the tongue every 5 (five) minutes x 3 doses as needed for chest pain. 25 tablet 4  . ondansetron (ZOFRAN) 4 MG tablet Take 1 tablet (4 mg total) by mouth every 6 (six) hours. 12 tablet 0  . rOPINIRole (REQUIP) 3 MG tablet Take 3 mg by mouth 4 (four) times daily.    Marland Kitchen tiZANidine (ZANAFLEX) 4 MG tablet Take 4 mg by mouth every 6 (six) hours as needed for muscle spasms.   0   No current facility-administered medications on file prior to visit.     Health Maintenance:   Immunization History  Administered Date(s) Administered  . DT 04/14/2015  . Hepatitis B 07/09/1999, 08/09/1999, 01/09/2000  . Influenza, High Dose Seasonal PF 03/20/2014  . Influenza-Unspecified 03/28/2015, 04/14/2015  . Pneumococcal Conjugate-13 03/20/2014  . Pneumococcal Polysaccharide-23 10/15/2009  . Td 03/19/2004    Tetanus: 2005 Pneumovax: 2011 Prevnar 13: 2015 Flu vaccine:2017 Pap: Aged out [id number]  DEXA: Due Colonoscopy:2010 Last Eye Exam: Dr. Satira Sark, possible glaucoma  Patient Care Team: [name], MD as PCP - General (Internal Medicine) Marygrace Drought, MD as Consulting Physician (Ophthalmology) Inda Castle, MD as Consulting Physician (Gastroenterology) Larey Dresser, MD as Consulting Physician (Cardiology) Nobie Putnam, MD as Consulting Physician (Oncology) Tania Ade, MD as Consulting Physician (Orthopedic Surgery)  Allergies:   Allergies  Allergen Reactions  . Brilinta [Ticagrelor] Shortness Of Breath and Other (See Comments)    Reaction:  Chest pain   . Lyrica [Pregabalin] Other (See Comments)    Reaction: Blurry vision   . Aspirin Other (See Comments)    Reaction:  Unknown  Pt is able to take in small doses.   . Crestor [Rosuvastatin] Other (See Comments)    Reaction:  Leg cramps   . Lipitor [Atorvastatin] Other (See Comments)    Reaction:  Leg cramps  Pt is currently on this medication.    . Minocycline Hcl Other (See Comments)    Reaction:  Unknown   . Oruvail [Ketoprofen] Other (See Comments)    Reaction:  Unknown   . Tricor [Fenofibrate] Other (See Comments)    Reaction:  Unknown   . Vasotec [Enalaprilat] Other (See Comments)    Reaction:  Unknown   . Victoza [Liraglutide] Other (See Comments)    Reaction:  Unknown   . Zinc Other (See Comments)    Reaction:  Unknown     Medical History:  Past Medical History:  Diagnosis Date  . Anemia    hx (04/14/2016)  . Anxiety   . Arthritis    "severe in my back; hands; ankles" (04/14/2016)  . Basal cell carcinoma    "several burned off; one cut off"  . CAD in native artery    a. NSTEMI 04/2016 - s/p DES toLAD and LCx. PCI to LCx notable for microembolization during cath.  . Chest pain- reslved with stopping Brilinta now on Plaix 04/16/2016  . Chronic lower back pain   . CKD (chronic kidney disease), stage III   . Depression   . DJD (degenerative joint disease)   . Family history of adverse reaction to anesthesia    "half-sister used to get real sick" (04/14/2016)  . GERD (gastroesophageal reflux disease)   . History of gout   . Hyperlipidemia   . Hypertension   . IBS (irritable bowel syndrome)   . Ischemic cardiomyopathy    a. 04/2016: EF 40-50% by cath, 50-55% +WMA by echo.  . 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 ([place]) ~ 2015  . Morbid obesity ([place])   . NSTEMI (non-ST elevated myocardial infarction) ([place]) 04/14/2016  . OSA on CPAP   .  Peripheral neuropathy ([place])   . RLS (restless legs syndrome)   . Spinal stenosis   . Type II diabetes mellitus ([place])   . Vitamin D deficiency     Surgical History:  Past Surgical History:  Procedure Laterality Date  . APPENDECTOMY    . BACK SURGERY    . BASAL CELL CARCINOMA EXCISION Left    leg  . CARDIAC CATHETERIZATION N/A 04/15/2016   Procedure: Left Heart Cath and Coronary Angiography;  Surgeon: Belva Crome, MD;  Location: [facility];  Service: Cardiovascular;  Laterality: N/A;  . CARDIAC CATHETERIZATION N/A 04/15/2016   Procedure: Coronary Stent Intervention;  Surgeon: Belva Crome, MD;  Location: [facility];  Service: Cardiovascular;  Laterality: N/A;  Mid LAD Mid CFX  . CATARACT EXTRACTION, BILATERAL Bilateral   . JOINT REPLACEMENT    . LUMBAR DISC SURGERY  X 2  . MELANOMA EXCISION Left    "towards the back of my neck"  . SHOULDER ARTHROSCOPY W/ ROTATOR CUFF REPAIR Left   . SHOULDER OPEN ROTATOR CUFF REPAIR Right   . TOTAL KNEE ARTHROPLASTY Bilateral     Family History:  Family History  Problem Relation Age of Onset  . Heart disease Mother   . Kidney disease Father   . Colon cancer Neg Hx   . Colon polyps Neg Hx   . Esophageal cancer Neg Hx   . Pancreatic cancer Neg Hx   . Stomach cancer Neg Hx   . Liver disease Neg Hx   . Diabetes Neg Hx     Social History:  Social History  Substance Use Topics  . Smoking status: Former Smoker    Packs/day: 2.50    Years: 25.00    Types: Cigarettes    Quit date: 05/11/1983  . Smokeless tobacco: Former Systems developer  . Alcohol use No    Review of Systems: Review of Systems  Constitutional: Negative for chills, fever and malaise/fatigue.  HENT: Negative for congestion, ear pain and sore throat.   Eyes: Negative.   Respiratory: Negative for cough, shortness of breath and wheezing.   Cardiovascular: Negative for chest pain, palpitations, leg swelling and PND.  Gastrointestinal: Negative for abdominal pain, blood  in stool,  constipation, diarrhea, heartburn and melena.  Genitourinary: Negative.   Musculoskeletal: Positive for back pain and myalgias.  Skin: Negative.   Neurological: Negative for dizziness, sensory change, loss of consciousness and headaches.  Psychiatric/Behavioral: Negative for depression. The patient is not nervous/anxious and does not have insomnia.     Physical Exam: Estimated body mass index is 40.86 kg/m as calculated from the following:   Height as of this encounter: 5' 2.5" (1.588 m).   Weight as of this encounter: 227 lb (103 kg). BP (!) 148/70   Pulse 80   Temp 98.2 F (36.8 C) (Temporal)   Resp 18   Ht 5' 2.5" (1.588 m)   Wt 227 lb (103 kg)   BMI 40.86 kg/m   General Appearance: Well nourished well developed, in no apparent distress.  Eyes: PERRLA, EOMs, conjunctiva no swelling or erythema ENT/Mouth: Ear canals normal without obstruction, swelling, erythema, or discharge.  TMs normal bilaterally with no erythema, bulging, retraction, or loss of landmark.  Oropharynx moist and clear with no exudate, erythema, or swelling.   Neck: Supple, thyroid normal. No bruits.  No cervical adenopathy Respiratory: Respiratory effort normal, Breath sounds clear A&P without wheeze, rhonchi, rales.   Cardio: RRR without murmurs, rubs or gallops. 1+ peripheral pulses without edema.  Chest: symmetric, with normal excursions Breasts: Patient declined examination  Abdomen: Soft, nontender, no guarding, rebound, hernias, masses, or organomegaly.  Lymphatics: Non tender without lymphadenopathy.  Genitourinary: Patient declined examination Musculoskeletal: Full ROM all peripheral extremities,5/5 strength, and normal gait.  Skin: Warm, dry without rashes, lesions, ecchymosis. Neuro: Awake and oriented X 3, Cranial nerves intact, reflexes equal bilaterally. Normal muscle tone, no cerebellar symptoms. Sensation intact.  Psych:  normal affect, Insight and Judgment appropriate.    Over 40  minutes of exam, counseling, chart review and critical decision making was performed  [name] 3:19 PM [facility]

## 2016-04-26 NOTE — Patient Instructions (Signed)
Medication Instructions:  Your physician recommends that you continue on your current medications as directed. Please refer to the Current Medication list given to you today.   Labwork: Please have your diabetes doctor forward a copy of the results for your upcoming lab work  Testing/Procedures: None ordered  Follow-Up: Your physician recommends that you schedule a follow-up appointment in: 3 months with Dr.Sullivan  You have been referred to the Vein and Vascular Specialist, they will contact you directly to schedule an appointment     Any Other Special Instructions Will Be Listed Below (If Applicable).     If you need a refill on your cardiac medications before your next appointment, please call your pharmacy.

## 2016-04-26 NOTE — Patient Instructions (Signed)
Please call the [place] [facility] and get your mammogram scheduled and ask them to get a bone density study done at the same time.  The order is already in the computer system.  Please watch your blood pressure at home once daily.  If you are running consistently over 150/90 call the office.  Please try to take the hydrocodone sparingly if you can.  Try heat on your back to see if this will help with the muscle spasms.  Please continue the lipitor once daily.  We may be able to cut this back.  Please let me know if you need help getting into the cardiac rehab or the physical therapy for your back.  Keep up the good work on your blood sugar management.  I am proud of you.

## 2016-04-26 NOTE — Progress Notes (Signed)
Cardiology Office Note   Date:  04/26/2016   ID:  [name], DOB Sep 08, 1940, MRN [id number]  PCP:  Alesia Richards, MD  Cardiologist:  Dr Oliver Barre, PA-C   History of Present Illness: Janet Choi is a 75 y.o. female with a history of diverticulitis, CKD, HTN, DM on insulin, chronic low back pain, DJD, anemia, diabetic neuropathy, HLD, CKD stage III, morbid obesity, OSA on CPAP  D/c 12/08 after admit for NSTEMI, s/p DES to mLAD and mLCx. ICM w/ EF 40-50% by cath and 50-55% by echo.  PCI to Cx was complicated by microembolization to the the most distal obtuse marginal beyond a bifurcation. She was treated with Aggrastat and IV NTG   12/08-12/04/2016, admit for chest pain, Brilinta d/c'd, seen by Dr Trenton Gammon (NS) who recommended outpt epidural steroid injections (once cardiac recovered) for spinal stenosis. Seen by Dr Kirby Crigler (psych) due to concerns about Plavix and Prozac interactions, d/c Prozac.  12/14, ER visit for abd pain w/ N&V and diarrhea, dx hydronephrosis and hematuria w/ ?recently passed kidney stone plus viral GI illness. 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  Janet Choi presents for follow up. Janet Choi is with Janet.  She has not had chest pain. She has had some chest heaviness or pressure, relieved by the Xanax. It does not feel anything like Janet MI. It was not exertional.   She feels weak and does not have much energy. She has a minor amount of LLE daytime edema, does not wake with swelling. Sx do not change after she takes Janet meds, she still feels weak.Marland Kitchen   She was told that she needs referral to a vascular surgeon. She is not having any abdominal pain after eating. Janet nausea and diarrhea have resolved. Janet cath site healed in well.   She was having headaches before Janet MI, they have continued since then. Tylenol helps but she is getting them every day. She has not been using CPAP. She has been waking up  after a few hours of sleep, cannot get back to sleep. Mild leg cramping at times, not severe. Janet main activity limitation is back pain. She has recently been having more muscle spasms.   She admits to poor eating habits, Janet CBG was 56 early this am. She does not consistently eat a bedtime snack. She admits to eating too many carbs.    Past Medical History:  Diagnosis Date  . Anemia    hx (04/14/2016)  . Anxiety   . Arthritis    "severe in my back; hands; ankles" (04/14/2016)  . Basal cell carcinoma    "several burned off; one cut off"  . CAD in native artery    a. NSTEMI 04/2016 - s/p DES toLAD and LCx. PCI to LCx notable for microembolization during cath.  . Chest pain- reslved with stopping Brilinta now on Plaix 04/16/2016  . Chronic lower back pain   . CKD (chronic kidney disease), stage III   . Depression   . DJD (degenerative joint disease)   . Family history of adverse reaction to anesthesia    "half-Choi used to get real sick" (04/14/2016)  . GERD (gastroesophageal reflux disease)   . History of gout   . Hyperlipidemia   . Hypertension   . IBS (irritable bowel syndrome)   . Ischemic cardiomyopathy    a. 04/2016: EF 40-50% by cath, 50-55% +WMA by echo.  . 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 (  [place]) ~ 2015  . Morbid obesity ([place])   . NSTEMI (non-ST elevated myocardial infarction) ([place]) 04/14/2016  . OSA on CPAP   . Peripheral neuropathy ([place])   . RLS (restless legs syndrome)   . Spinal stenosis   . Type II diabetes mellitus ([place])   . Vitamin D deficiency     Past Surgical History:  Procedure Laterality Date  . APPENDECTOMY    . BACK SURGERY    . BASAL CELL CARCINOMA EXCISION Left    leg  . CARDIAC CATHETERIZATION N/A 04/15/2016   Procedure: Left Heart Cath and Coronary Angiography;  Surgeon: Belva Crome, MD;  Location: [facility];  Service: Cardiovascular;  Laterality: N/A;  . CARDIAC CATHETERIZATION N/A 04/15/2016   Procedure: Coronary Stent  Intervention;  Surgeon: Belva Crome, MD;  Location: [facility];  Service: Cardiovascular;  Laterality: N/A;  Mid LAD Mid CFX  . CATARACT EXTRACTION, BILATERAL Bilateral   . JOINT REPLACEMENT    . LUMBAR DISC SURGERY  X 2  . MELANOMA EXCISION Left    "towards the back of my neck"  . SHOULDER ARTHROSCOPY W/ ROTATOR CUFF REPAIR Left   . SHOULDER OPEN ROTATOR CUFF REPAIR Right   . TOTAL KNEE ARTHROPLASTY Bilateral     Current Outpatient Prescriptions  Medication Sig Dispense Refill  . acetaminophen (TYLENOL) 325 MG tablet Take 2 tablets (650 mg total) by mouth every 4 (four) hours as needed for headache or mild pain.    Marland Kitchen ALPRAZolam (XANAX) 0.25 MG tablet Take 0.125-0.25 mg by mouth 3 (three) times daily as needed for anxiety.    Marland Kitchen aspirin EC 81 MG EC tablet Take 1 tablet (81 mg total) by mouth daily.    Marland Kitchen atorvastatin (LIPITOR) 80 MG tablet Take 1 tablet (80 mg total) by mouth daily at 6 PM. 90 tablet 3  . clopidogrel (PLAVIX) 75 MG tablet Take 1 tablet (75 mg total) by mouth daily. 30 tablet 6  . diclofenac (FLECTOR) 1.3 % PTCH Place 1 patch onto the skin daily as needed (for pain).     Marland Kitchen gabapentin (NEURONTIN) 600 MG tablet Take 300 mg by mouth at bedtime.    Marland Kitchen HYDROcodone-acetaminophen (NORCO/VICODIN) 5-325 MG tablet Take 0.5-1 tablets by mouth every 6 (six) hours as needed for moderate pain.   0  . insulin NPH-regular Human (NOVOLIN 70/30) (70-30) 100 UNIT/ML injection Inject 40-60 Units into the skin 2 (two) times daily. Pt uses 40 units in the morning and 60 units at bedtime.    Marland Kitchen losartan (COZAAR) 50 MG tablet Take 1 tablet (50 mg total) by mouth daily. 30 tablet 0  . nitroGLYCERIN (NITROSTAT) 0.4 MG SL tablet Place 1 tablet (0.4 mg total) under the tongue every 5 (five) minutes x 3 doses as needed for chest pain. 25 tablet 4  . ondansetron (ZOFRAN) 4 MG tablet Take 1 tablet (4 mg total) by mouth every 6 (six) hours. 12 tablet 0  . rOPINIRole (REQUIP) 3 MG tablet Take 3 mg by  mouth 4 (four) times daily.    Marland Kitchen tiZANidine (ZANAFLEX) 4 MG tablet Take 4 mg by mouth every 6 (six) hours as needed for muscle spasms.   0   No current facility-administered medications for this visit.     Allergies:   Brilinta [ticagrelor]; Lyrica [pregabalin]; Aspirin; Crestor [rosuvastatin]; Lipitor [atorvastatin]; Minocycline hcl; Oruvail [ketoprofen]; Tricor [fenofibrate]; Vasotec [enalaprilat]; Victoza [liraglutide]; and Zinc    Social History:  The patient  reports that she  quit smoking about 32 years ago. Janet smoking use included Cigarettes. She has a 62.50 pack-year smoking history. She has quit using smokeless tobacco. She reports that she does not drink alcohol or use drugs.   Family History:  The patient's family history includes Heart disease in Janet mother; Kidney disease in Janet father.    ROS:  Please see the history of present illness. All other systems are reviewed and negative.    PHYSICAL EXAM: VS:  BP (!) 130/50   Pulse 75   Ht 5\' 3"  (1.6 m)   Wt 227 lb (103 kg)   BMI 40.21 kg/m  , BMI Body mass index is 40.21 kg/m. GEN: Well nourished, well developed, female in no acute distress  HEENT: normal for age  Neck: no JVD, no carotid bruit, no masses Cardiac: RRR; no murmur, no rubs, or gallops Respiratory:  clear to auscultation bilaterally, normal work of breathing GI: soft, nontender, nondistended, + BS MS: no deformity or atrophy; trace edema; distal pulses are 2+ in all 4 extremities   Skin: warm and dry, no rash Neuro:  Strength and sensation are intact Psych: euthymic mood, full affect   EKG:  EKG is not ordered today.  Recent Labs: 10/21/2015: Magnesium 1.9; TSH 0.70 04/18/2016: B Natriuretic Peptide 463.5 04/22/2016: ALT 20; BUN 26; Creatinine, Ser 1.26; Hemoglobin 12.1; Platelets 255; Potassium 4.4; Sodium 140    Lipid Panel    Component Value Date/Time   CHOL 251 (H) 04/17/2016 0440   TRIG 341 (H) 04/17/2016 0440   HDL 38 (L) 04/17/2016 0440    CHOLHDL 6.6 04/17/2016 0440   VLDL 68 (H) 04/17/2016 0440   LDLCALC 145 (H) 04/17/2016 0440     Wt Readings from Last 3 Encounters:  04/26/16 227 lb (103 kg)  04/20/16 229 lb 1.6 oz (103.9 kg)  04/15/16 230 lb 8 oz (104.6 kg)     Other studies Reviewed: Additional studies/ records that were reviewed today include: office notes, hospital records and testing.  ASSESSMENT AND PLAN:  1. NSTEMI: s/p DES mLAD & CFX, complicated by distal embolization. No exertional chest pain. Continue ASA, Lipitor 80 mg, Plavix, Cozaar. Can add Imdur if develops CP w/ exertion. Pt to do Cardiac rehab.   2. Anxiety: She admits to anxiety, feels this is causing Janet CP. Continue rx per PCP.  3. HLD, goal LDL < 70: She is tolerating Lipitor 80 mg, emphasized the need to get LDL down. Pt to fax labs obtained elsewhere to [place] to document Janet lipids. We will leave management to Janet PCP/Diabetes MD.  4. DM: encouraged Janet to discuss with Janet PCP if changes are needed to DM meds between now and when she sees the DM doctor. Encouraged Janet to eat consistent small meals with protein and have a bedtime snack.  5. MS Issues: Advised that she can have procedures after MI recovery, needs to attend rehab first. However, with DES x 2, cannot stop Plavix for a year.  6. 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 We will refer to [facility]. No ongoing GI sx.  Current medicines are reviewed at length with the patient today.  The patient does not have concerns regarding medicines.  The following changes have been made:  no change  Labs/ tests ordered today include:  No orders of the defined types were placed in this encounter.    Disposition:   FU with Dr Oval Linsey  Signed, Rosaria Ferries, PA-C  04/26/2016 10:55 AM    [facility] Phone: [phone]; Fax: [phone]  This note was written with the assistance of speech recognition software. Please excuse any transcriptional errors.

## 2016-04-27 ENCOUNTER — Other Ambulatory Visit: Payer: Self-pay | Admitting: Internal Medicine

## 2016-04-27 DIAGNOSIS — Z6841 Body Mass Index (BMI) 40.0 and over, adult: Secondary | ICD-10-CM | POA: Diagnosis not present

## 2016-04-27 DIAGNOSIS — I10 Essential (primary) hypertension: Secondary | ICD-10-CM | POA: Diagnosis not present

## 2016-04-27 DIAGNOSIS — M48062 Spinal stenosis, lumbar region with neurogenic claudication: Secondary | ICD-10-CM | POA: Diagnosis not present

## 2016-04-27 MED ORDER — ONETOUCH ULTRASOFT LANCETS MISC: 12 refills | Status: DC

## 2016-04-27 MED ORDER — GLUCOSE BLOOD VI STRP: ORAL_STRIP | 12 refills | Status: DC

## 2016-04-27 MED ORDER — ONETOUCH ULTRA 2 W/DEVICE KIT: PACK | 0 refills | Status: DC

## 2016-04-27 NOTE — Telephone Encounter (Signed)
Ok. Done 

## 2016-04-27 NOTE — Telephone Encounter (Signed)
Always OK to fill these/

## 2016-04-27 NOTE — Telephone Encounter (Signed)
Patient need refill of one touch ultra 2 meter, patient need test strips and lancets.   [id number] [address], [place] [phone] (Phone) [phone] (Fax)

## 2016-04-28 ENCOUNTER — Emergency Department (HOSPITAL_COMMUNITY)
Admission: EM | Admit: 2016-04-28 | Discharge: 2016-04-28 | Disposition: A | Discharge status: Home / Self Care | Payer: PPO | Attending: Emergency Medicine | Admitting: Emergency Medicine

## 2016-04-28 ENCOUNTER — Emergency Department (HOSPITAL_COMMUNITY): Payer: PPO

## 2016-04-28 ENCOUNTER — Encounter (HOSPITAL_COMMUNITY): Payer: Self-pay | Admitting: Emergency Medicine

## 2016-04-28 DIAGNOSIS — Z9989 Dependence on other enabling machines and devices: Secondary | ICD-10-CM

## 2016-04-28 DIAGNOSIS — Z955 Presence of coronary angioplasty implant and graft: Secondary | ICD-10-CM | POA: Diagnosis not present

## 2016-04-28 DIAGNOSIS — Z87891 Personal history of nicotine dependence: Secondary | ICD-10-CM | POA: Insufficient documentation

## 2016-04-28 DIAGNOSIS — Z9861 Coronary angioplasty status: Secondary | ICD-10-CM

## 2016-04-28 DIAGNOSIS — I119 Hypertensive heart disease without heart failure: Secondary | ICD-10-CM | POA: Insufficient documentation

## 2016-04-28 DIAGNOSIS — N183 Chronic kidney disease, stage 3 (moderate): Secondary | ICD-10-CM | POA: Diagnosis not present

## 2016-04-28 DIAGNOSIS — I2510 Atherosclerotic heart disease of native coronary artery without angina pectoris: Secondary | ICD-10-CM | POA: Diagnosis not present

## 2016-04-28 DIAGNOSIS — I252 Old myocardial infarction: Secondary | ICD-10-CM | POA: Insufficient documentation

## 2016-04-28 DIAGNOSIS — M549 Dorsalgia, unspecified: Secondary | ICD-10-CM | POA: Diagnosis present

## 2016-04-28 DIAGNOSIS — Z85820 Personal history of malignant melanoma of skin: Secondary | ICD-10-CM | POA: Diagnosis not present

## 2016-04-28 DIAGNOSIS — R0789 Other chest pain: Secondary | ICD-10-CM | POA: Diagnosis not present

## 2016-04-28 DIAGNOSIS — I129 Hypertensive chronic kidney disease with stage 1 through stage 4 chronic kidney disease, or unspecified chronic kidney disease: Secondary | ICD-10-CM | POA: Diagnosis not present

## 2016-04-28 DIAGNOSIS — Z7982 Long term (current) use of aspirin: Secondary | ICD-10-CM | POA: Diagnosis not present

## 2016-04-28 DIAGNOSIS — G4733 Obstructive sleep apnea (adult) (pediatric): Secondary | ICD-10-CM

## 2016-04-28 DIAGNOSIS — R079 Chest pain, unspecified: Secondary | ICD-10-CM | POA: Diagnosis not present

## 2016-04-28 DIAGNOSIS — Z79899 Other long term (current) drug therapy: Secondary | ICD-10-CM | POA: Insufficient documentation

## 2016-04-28 DIAGNOSIS — Z794 Long term (current) use of insulin: Secondary | ICD-10-CM | POA: Diagnosis not present

## 2016-04-28 DIAGNOSIS — E1140 Type 2 diabetes mellitus with diabetic neuropathy, unspecified: Secondary | ICD-10-CM | POA: Diagnosis not present

## 2016-04-28 DIAGNOSIS — F419 Anxiety disorder, unspecified: Secondary | ICD-10-CM

## 2016-04-28 DIAGNOSIS — E785 Hyperlipidemia, unspecified: Secondary | ICD-10-CM | POA: Diagnosis present

## 2016-04-28 DIAGNOSIS — Z8679 Personal history of other diseases of the circulatory system: Secondary | ICD-10-CM

## 2016-04-28 DIAGNOSIS — R0602 Shortness of breath: Secondary | ICD-10-CM | POA: Diagnosis not present

## 2016-04-28 DIAGNOSIS — Z96653 Presence of artificial knee joint, bilateral: Secondary | ICD-10-CM | POA: Insufficient documentation

## 2016-04-28 DIAGNOSIS — I10 Essential (primary) hypertension: Secondary | ICD-10-CM | POA: Diagnosis present

## 2016-04-28 DIAGNOSIS — E1122 Type 2 diabetes mellitus with diabetic chronic kidney disease: Secondary | ICD-10-CM | POA: Diagnosis present

## 2016-04-28 DIAGNOSIS — E1169 Type 2 diabetes mellitus with other specified complication: Secondary | ICD-10-CM | POA: Diagnosis present

## 2016-04-28 LAB — BASIC METABOLIC PANEL
ANION GAP: 11 (ref 5–15)
BUN: 19 mg/dL (ref 6–20)
CALCIUM: 9.5 mg/dL (ref 8.9–10.3)
CO2: 24 mmol/L (ref 22–32)
Chloride: 107 mmol/L (ref 101–111)
Creatinine, Ser: 1.14 mg/dL — ABNORMAL HIGH (ref 0.44–1.00)
GFR, EST AFRICAN AMERICAN: 53 mL/min — AB (ref >60)
GFR, EST NON AFRICAN AMERICAN: 46 mL/min — AB (ref >60)
GLUCOSE: 148 mg/dL — AB (ref 65–99)
POTASSIUM: 4.2 mmol/L (ref 3.5–5.1)
Sodium: 142 mmol/L (ref 135–145)

## 2016-04-28 LAB — CBC
HEMATOCRIT: 37.1 % (ref 36.0–46.0)
Hemoglobin: 11.7 g/dL — ABNORMAL LOW (ref 12.0–15.0)
MCH: 28.5 pg (ref 26.0–34.0)
MCHC: 31.5 g/dL (ref 30.0–36.0)
MCV: 90.5 fL (ref 78.0–100.0)
Platelets: 228 10*3/uL (ref 150–400)
RBC: 4.1 MIL/uL (ref 3.87–5.11)
RDW: 14.1 % (ref 11.5–15.5)
WBC: 5 10*3/uL (ref 4.0–10.5)

## 2016-04-28 LAB — I-STAT TROPONIN, ED
Troponin i, poc: 0.02 ng/mL (ref 0.00–0.08)
Troponin i, poc: 0.03 ng/mL (ref 0.00–0.08)

## 2016-04-28 MED ORDER — ISOSORBIDE MONONITRATE ER 30 MG PO TB24
30.0000 mg | ORAL_TABLET | Freq: Every day | ORAL | Status: DC
  Administered 2016-04-28: 30 mg via ORAL
  Filled 2016-04-28: qty 1

## 2016-04-28 MED ORDER — ISOSORBIDE MONONITRATE ER 30 MG PO TB24: 30.0000 mg | ORAL_TABLET | Freq: Every day | ORAL | 0 refills | Status: DC

## 2016-04-28 NOTE — ED Provider Notes (Signed)
[facility] DEPT Provider Note   CSN: [id number] Arrival date & time: 04/28/16  1313     History   Chief Complaint Chief Complaint  Patient presents with  . Chest Pain    HPI Janet Choi is a 75 y.o. female.  Patient with h/o NSTEMI 04/14/16 with cath s/p coronary artery stent 04/15/16 compliant with Plavix -- presents with complaint of chest pressure that started acutely when she awoke this morning at 7 AM. Patient continued to have only pressure which she related to anxiety. She took a Xanax which has not helped. Approximately 10:30 AM she developed lower chest pain which radiated to her right chest at times. She had some mild shortness of breath and diaphoresis. She had nausea but no vomiting. Symptoms began at rest. Patient took 3 nitroglycerin with improvement and currently only has some heaviness sensation. She called EMS for transport to the hospital. The onset of this condition was acute. The course is constant. Aggravating factors: none. Alleviating factors: NTG.        Past Medical History:  Diagnosis Date  . Anemia    hx (04/14/2016)  . Anxiety   . Arthritis    "severe in my back; hands; ankles" (04/14/2016)  . Basal cell carcinoma    "several burned off; one cut off"  . CAD in native artery    a. NSTEMI 04/2016 - s/p DES toLAD and LCx. PCI to LCx notable for microembolization during cath.  . Chest pain- reslved with stopping Brilinta now on Plaix 04/16/2016  . Chronic lower back pain   . CKD (chronic kidney disease), stage III   . Depression   . DJD (degenerative joint disease)   . Family history of adverse reaction to anesthesia    "half-sister used to get real sick" (04/14/2016)  . GERD (gastroesophageal reflux disease)   . History of gout   . Hyperlipidemia   . Hypertension   . IBS (irritable bowel syndrome)   . Ischemic cardiomyopathy    a. 04/2016: EF 40-50% by cath, 50-55% +WMA by echo.  . 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 ([place]) ~ 2015  .  Morbid obesity ([place])   . NSTEMI (non-ST elevated myocardial infarction) ([place]) 04/14/2016  . OSA on CPAP   . Peripheral neuropathy ([place])   . RLS (restless legs syndrome)   . Spinal stenosis   . Type II diabetes mellitus ([place])   . Vitamin D deficiency     Patient Active Problem List   Diagnosis Date Noted  . Elevated troponin   . SOB (shortness of breath) 04/19/2016  . CAD in native artery 04/19/2016  . Anxiety 04/19/2016  . Chest pain- reslved with stopping Brilinta now on Plaix 04/16/2016  . Other chest pain   . Coronary artery disease involving native coronary artery of native heart without angina pectoris   . Hypertensive heart disease without heart failure   . S/P coronary artery stent placement   . NSTEMI (non-ST elevated myocardial infarction) ([place]) 04/14/2016  . CKD (chronic kidney disease), stage III 04/14/2016  . Back pain 04/14/2016  . Iron deficiency anemia 08/15/2015  . Hypoxia 08/06/2015  . OSA on CPAP 04/16/2015  . BMI 38.0-38.9,adult 04/14/2015  . Medicare annual wellness visit, subsequent 04/14/2015  . RLS (restless legs syndrome) 03/20/2014  . Poorly controlled type 2 diabetes mellitus with peripheral neuropathy ([place]) 03/20/2014  . Compliance poor 11/28/2013  . Medication management 09/12/2013  . Morbid obesity 05/14/2013  . Vitamin D deficiency 05/14/2013  .  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 Hyperlipidemia 02/24/2009  . Essential hypertension 02/24/2009  . Diverticulosis of large intestine 02/24/2009  . History of colonic polyps 02/24/2009    Past Surgical History:  Procedure Laterality Date  . APPENDECTOMY    . BACK SURGERY    . BASAL CELL CARCINOMA EXCISION Left    leg  . CARDIAC CATHETERIZATION N/A 04/15/2016   Procedure: Left Heart Cath and Coronary Angiography;  Surgeon: Lyn Records, MD;  Location: [facility] INVASIVE [facility];  Service: Cardiovascular;  Laterality: N/A;  . CARDIAC CATHETERIZATION N/A 04/15/2016   Procedure: Coronary Stent  Intervention;  Surgeon: Lyn Records, MD;  Location: [facility] INVASIVE CV LAB;  Service: Cardiovascular;  Laterality: N/A;  Mid LAD Mid CFX  . CATARACT EXTRACTION, BILATERAL Bilateral   . JOINT REPLACEMENT    . LUMBAR DISC SURGERY  X 2  . MELANOMA EXCISION Left    "towards the back of my neck"  . SHOULDER ARTHROSCOPY W/ ROTATOR CUFF REPAIR Left   . SHOULDER OPEN ROTATOR CUFF REPAIR Right   . TOTAL KNEE ARTHROPLASTY Bilateral     OB History    No data available       Home Medications    Prior to Admission medications   Medication Sig Start Date End Date Taking? Authorizing Provider  acetaminophen (TYLENOL) 325 MG tablet Take 2 tablets (650 mg total) by mouth every 4 (four) hours as needed for headache or mild pain. 04/20/16   Leone Brand, NP  ALPRAZolam Prudy Feeler) 0.25 MG tablet Take 0.125-0.25 mg by mouth 3 (three) times daily as needed for anxiety.    Historical Provider, MD  aspirin EC 81 MG EC tablet Take 1 tablet (81 mg total) by mouth daily. 04/21/16   Leone Brand, NP  atorvastatin (LIPITOR) 80 MG tablet Take 1 tablet (80 mg total) by mouth daily at 6 PM. 04/20/16   Abelino Derrick, PA-C  Blood Glucose Monitoring Suppl (ONE TOUCH ULTRA 2) w/Device KIT Use as directed 04/27/16   Carlus Pavlov, MD  clopidogrel (PLAVIX) 75 MG tablet Take 1 tablet (75 mg total) by mouth daily. 04/21/16   Leone Brand, NP  diclofenac (FLECTOR) 1.3 % PTCH Place 1 patch onto the skin daily as needed (for pain).     Historical Provider, MD  gabapentin (NEURONTIN) 600 MG tablet Take 300 mg by mouth at bedtime.    Historical Provider, MD  glucose blood test strip Use as instructed 04/27/16   Carlus Pavlov, MD  HYDROcodone-acetaminophen (NORCO/VICODIN) 5-325 MG tablet Take 0.5-1 tablets by mouth every 6 (six) hours as needed for moderate pain.     Historical Provider, MD  insulin NPH-regular Human (NOVOLIN 70/30) (70-30) 100 UNIT/ML injection Inject 40-60 Units into the skin 2 (two) times daily. Pt uses  40 units in the morning and 60 units at bedtime.    Historical Provider, MD  Lancets Letta Pate ULTRASOFT) lancets Use as instructed 04/27/16   Carlus Pavlov, MD  losartan (COZAAR) 50 MG tablet Take 1 tablet (50 mg total) by mouth daily. 04/16/16   Jennifer Chahn-Yang Choi, DO  nitroGLYCERIN (NITROSTAT) 0.4 MG SL tablet Place 1 tablet (0.4 mg total) under the tongue every 5 (five) minutes x 3 doses as needed for chest pain. 04/20/16   Leone Brand, NP  ondansetron (ZOFRAN) 4 MG tablet Take 1 tablet (4 mg total) by mouth every 6 (six) hours. 04/22/16   Rise Mu, PA-C  rOPINIRole (REQUIP) 3 MG  tablet Take 1 tablet (3 mg total) by mouth 4 (four) times daily. 04/26/16   Courtney Forcucci, PA-C  tiZANidine (ZANAFLEX) 4 MG tablet Take 4 mg by mouth every 6 (six) hours as needed for muscle spasms.     Historical Provider, MD    Family History Family History  Problem Relation Age of Onset  . Heart disease Mother   . Kidney disease Father   . Colon cancer Neg Hx   . Colon polyps Neg Hx   . Esophageal cancer Neg Hx   . Pancreatic cancer Neg Hx   . Stomach cancer Neg Hx   . Liver disease Neg Hx   . Diabetes Neg Hx     Social History Social History  Substance Use Topics  . Smoking status: Former Smoker    Packs/day: 2.50    Years: 25.00    Types: Cigarettes    Quit date: 05/11/1983  . Smokeless tobacco: Former Systems developer  . Alcohol use No     Allergies   Brilinta [ticagrelor]; Lyrica [pregabalin]; Aspirin; Crestor [rosuvastatin]; Lipitor [atorvastatin]; Minocycline hcl; Oruvail [ketoprofen]; Tricor [fenofibrate]; Vasotec [enalaprilat]; Victoza [liraglutide]; and Zinc   Review of Systems Review of Systems  Constitutional: Positive for diaphoresis. Negative for fever.  Eyes: Negative for redness.  Respiratory: Positive for shortness of breath. Negative for cough.   Cardiovascular: Positive for chest pain. Negative for palpitations and leg swelling.  Gastrointestinal: Positive for  nausea. Negative for abdominal pain and vomiting.  Genitourinary: Negative for dysuria.  Musculoskeletal: Negative for back pain and neck pain.  Skin: Negative for rash.  Neurological: Negative for syncope and light-headedness.  Psychiatric/Behavioral: The patient is nervous/anxious.      Physical Exam Updated Vital Signs BP 145/65 (BP Location: Right Arm)   Pulse 69   Temp 98 F (36.7 C) (Oral)   Resp 20   Ht '5\' 7"'$  (1.702 m)   Wt 103 kg   SpO2 99%   BMI 35.55 kg/m   Physical Exam  Constitutional: She appears well-developed and well-nourished.  HENT:  Head: Normocephalic and atraumatic.  Mouth/Throat: Oropharynx is clear and moist and mucous membranes are normal. Mucous membranes are not dry.  Eyes: Conjunctivae are normal.  Neck: Trachea normal and normal range of motion. Neck supple. Normal carotid pulses and no JVD present. No muscular tenderness present. Carotid bruit is not present. No tracheal deviation present.  Cardiovascular: Normal rate, regular rhythm, S1 normal, S2 normal, normal heart sounds and intact distal pulses.  Exam reveals no decreased pulses.   No murmur heard. Pulmonary/Chest: Effort normal. No respiratory distress. She has no wheezes. She exhibits no tenderness.  Abdominal: Soft. Normal aorta and bowel sounds are normal. There is no tenderness. There is no rebound and no guarding.  Musculoskeletal: Normal range of motion. She exhibits no edema.  Neurological: She is alert.  Skin: Skin is warm and dry. She is not diaphoretic. No cyanosis. No pallor.  Psychiatric: She has a normal mood and affect.  Nursing note and vitals reviewed.    ED Treatments / Results  Labs (all labs ordered are listed, but only abnormal results are displayed) Labs Reviewed  CBC - Abnormal; Notable for the following:       Result Value   Hemoglobin 11.7 (*)    All other components within normal limits  BASIC METABOLIC PANEL - Abnormal; Notable for the following:     Glucose, Bld 148 (*)    Creatinine, Ser 1.14 (*)    GFR calc non Af  Amer 46 (*)    GFR calc Af Amer 53 (*)    All other components within normal limits  I-STAT TROPOININ, ED    EKG  EKG Interpretation  Date/Time:  Wednesday April 28 2016 13:46:56 EST Ventricular Rate:  67 PR Interval:    QRS Duration: 92 QT Interval:  445 QTC Calculation: 470 R Axis:   -56 Text Interpretation:  Atrial fibrillation Inferior infarct, old Anterior infarct, old No STEMI.  Confirmed by LONG MD, Hazen Brumett [id number]) on 04/28/2016 2:21:12 PM       Radiology Dg Chest 2 View  Result Date: 04/28/2016 CLINICAL DATA:  Chest pain, shortness of breath, nausea EXAM: CHEST  2 VIEW COMPARISON:  04/16/2016 FINDINGS: The heart size and mediastinal contours are within normal limits. Both lungs are clear. The visualized skeletal structures are unremarkable. IMPRESSION: No active cardiopulmonary disease. Electronically Signed   By: Kathreen Devoid   On: 04/28/2016 14:54    Procedures Procedures (including critical care time)  Medications Ordered in ED Medications - No data to display   Initial Impression / Assessment and Plan / ED Course  I have reviewed the triage vital signs and the nursing notes.  Pertinent labs & imaging results that were available during my care of the patient were reviewed by me and considered in my medical decision making (see chart for details).  Clinical Course    Patient seen and examined. Work-up initiated. Discussed with Dr. Laverta Baltimore. Will request cardiology consult.   Vital signs reviewed and are as follows: BP 145/65 (BP Location: Right Arm)   Pulse 69   Temp 98 F (36.7 C) (Oral)   Resp 20   Ht '5\' 7"'$  (1.702 m)   Wt 103 kg   SpO2 99%   BMI 35.55 kg/m   Pending cardiology consult to assist with disposition decision.   Final Clinical Impressions(s) / ED Diagnoses   Final diagnoses:  Chest pain, unspecified type   Pending cardiology reccs.   New Prescriptions New  Prescriptions   No medications on file     Carlisle Cater, PA-C 04/28/16 [place], MD 04/28/16 2107

## 2016-04-28 NOTE — Discharge Instructions (Signed)
Begin taking isosorbide once daily as prescribed.  Follow up with your cardiologist in one week, and return to the emergency department in the meantime if her symptoms worsen or change.

## 2016-04-28 NOTE — ED Notes (Signed)
Pt requesting ice pack, placed to R buttock/lower back.

## 2016-04-28 NOTE — Consult Note (Signed)
CARDIOLOGY CONSULT NOTE   Patient ID: Janet Choi MRN: [id number] DOB/AGE: [birth year] 75 y.o.  Admit date: 04/28/2016  Requesting Physician: Dr. Laverta Baltimore Primary Physician:   Alesia Richards, MD Primary Cardiologist:  Dr Oval Linsey  Reason for Consultation:  Chest pain  HPI: Janet Choi is a 75 y.o. female with a history of anemia, diabetes c/b neuropathy, HTN, hyperlipidemia, CKD stage III, morbid obesity, OSA on CPAP, and recently diagnosed CAD with NSTEMI s/p DES toLAD and LCx, ICM (EF 40-50% by cath and 50-55% by echo 04/15/16) who presents to [facility] with recurrent chest pain.   She was recently admitted for NSTEMI and underwent LHC 04/15/16 with identification of 27-74% segmental mid LAD stenosis and focal eccentric 95-99% mid circumflex stenosis.The first diagonal was small with segmental 80% stenosis. She underwent successful DES to mLAD and mLCx. However, her PCI to Cx was complicated by microembolization to the the most distal obtuse marginal beyond a bifurcation. She was treated with Aggrastat and IV NTG after her cath and discharged home on 12/8 on ASA/Brilinta. She was not placed on beta blocker due to bradycardia.  She was discharged on home on 04/16/16 and then returned back to the hospital same day with CP. This pain was very different from her previous chest pain with NSTEMI. Pain lasted approx 15 minutes, resolved with ASA and NG. She then called EMS who brought her to the ED. According to EMS SBP initially 210, decreased to 150 with NG. She was admitted for further evaluation. CXR was nonacute and CTA was negative for PE. Hgb was 11.7. Her initial troponin was 6. However, her troponin during last admission never truly peaked. As above she had a microembolization during cath which may have led to further increase in troponin at that time. The fact her istat troponin is hgher than last checked troponin was not felt to be neccesarily indicative that there was a new event. She  was treated with IV heparin while enzymes were further downtrended. Dr. Percival Spanish and Dr. Tamala Julian agreed with this assessment. The patient had some residual dyspnea thus Brilinta was changed to Plavix. The patient also felt there may be some component of anxiety related to her symptoms. No further ischemic workup was performed during her admission. BNP was somewhat elevated but she was not felt to be volume overloaded during admission. She was kept an extra day in order to be evaulated by neurosurgery due to spinal stenosis. Dr. Trenton Gammon didn't feel like there was an acute issues and arranged outpatient follow up. Psychiatrist Dr. Louretta Shorten saw her and we stopped her prozac given possible interaction with plavix.   On 12/14 she was seen at the [facility] ER for abd pain w/ N&V and diarrhea. She was diagnosed with hydronephrosis and hematuria w/ ?recently passed kidney stone plus viral GI illness. 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  She followed up with Rosaria Ferries on 04/26/16 in the office and was doing okay. She was referred to [facility] for evaluation of SMA stenosis seen incidentally on imaging.   She was doing okay until this AM when she woke up with chest pressure. She then had some chest pain similar to her MI. She took 3 SL NTG with no relief and then called EMS. By the time she got to [facility] she was chest pain free. Total time with chest pain was about 30-40 minutes.  ECG with no acute ST/TW changes and troponin x1 negative. All other labwork unremarkable except  hyperglycemia. No SOB. No LE edema, orthopnea or PND. No dizziness or syncope. No blood in stool or urine. No palpitations. She has chronic back pain and is just very upset with all that had gone on with her health this month. Just sick of it all. Wants to go home. Thinks there is an anxiety component to her chest pain. Wants to try another antidepressant.     Past Medical History:  Diagnosis Date  . Anemia    hx  (04/14/2016)  . Anxiety   . Arthritis    "severe in my back; hands; ankles" (04/14/2016)  . Basal cell carcinoma    "several burned off; one cut off"  . CAD in native artery    a. NSTEMI 04/2016 - s/p DES toLAD and LCx. PCI to LCx notable for microembolization during cath.  . Chest pain- reslved with stopping Brilinta now on Plaix 04/16/2016  . Chronic lower back pain   . CKD (chronic kidney disease), stage III   . Depression   . DJD (degenerative joint disease)   . Family history of adverse reaction to anesthesia    "half-sister used to get real sick" (04/14/2016)  . GERD (gastroesophageal reflux disease)   . History of gout   . Hyperlipidemia   . Hypertension   . IBS (irritable bowel syndrome)   . Ischemic cardiomyopathy    a. 04/2016: EF 40-50% by cath, 50-55% +WMA by echo.  . 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 ([place]) ~ 2015  . Morbid obesity ([place])   . NSTEMI (non-ST elevated myocardial infarction) ([place]) 04/14/2016  . OSA on CPAP   . Peripheral neuropathy ([place])   . RLS (restless legs syndrome)   . Spinal stenosis   . Type II diabetes mellitus ([place])   . Vitamin D deficiency      Past Surgical History:  Procedure Laterality Date  . APPENDECTOMY    . BACK SURGERY    . BASAL CELL CARCINOMA EXCISION Left    leg  . CARDIAC CATHETERIZATION N/A 04/15/2016   Procedure: Left Heart Cath and Coronary Angiography;  Surgeon: Belva Crome, MD;  Location: [place] [facility];  Service: Cardiovascular;  Laterality: N/A;  . CARDIAC CATHETERIZATION N/A 04/15/2016   Procedure: Coronary Stent Intervention;  Surgeon: Belva Crome, MD;  Location: [place] [facility];  Service: Cardiovascular;  Laterality: N/A;  Mid LAD Mid CFX  . CATARACT EXTRACTION, BILATERAL Bilateral   . JOINT REPLACEMENT    . LUMBAR DISC SURGERY  X 2  . MELANOMA EXCISION Left    "towards the back of my neck"  . SHOULDER ARTHROSCOPY W/ ROTATOR CUFF REPAIR Left   . SHOULDER OPEN ROTATOR CUFF REPAIR Right   . TOTAL KNEE  ARTHROPLASTY Bilateral     Allergies  Allergen Reactions  . Brilinta [Ticagrelor] Shortness Of Breath and Other (See Comments)    Reaction:  Chest pain   . Lyrica [Pregabalin] Other (See Comments)    Reaction: Blurry vision   . Aspirin Other (See Comments)    Reaction:  Unknown  Pt is able to take in small doses.   . Crestor [Rosuvastatin] Other (See Comments)    Reaction:  Leg cramps   . Lipitor [Atorvastatin] Other (See Comments)    Reaction:  Leg cramps  Pt is currently on this medication.    . Minocycline Hcl Other (See Comments)    Reaction:  Unknown   . Oruvail [Ketoprofen] Other (See Comments)    Reaction:  Unknown   .  Tricor [Fenofibrate] Other (See Comments)    Reaction:  Unknown   . Vasotec [Enalaprilat] Other (See Comments)    Reaction:  Unknown   . Victoza [Liraglutide] Other (See Comments)    Reaction:  Unknown   . Zinc Other (See Comments)    Reaction:  Unknown     I have reviewed the patient's current medications     Prior to Admission medications   Medication Sig Start Date End Date Taking? Authorizing Provider  acetaminophen (TYLENOL) 325 MG tablet Take 2 tablets (650 mg total) by mouth every 4 (four) hours as needed for headache or mild pain. 04/20/16   Leone Brand, NP  ALPRAZolam Prudy Feeler) 0.25 MG tablet Take 0.125-0.25 mg by mouth 3 (three) times daily as needed for anxiety.    Historical Provider, MD  aspirin EC 81 MG EC tablet Take 1 tablet (81 mg total) by mouth daily. 04/21/16   Leone Brand, NP  atorvastatin (LIPITOR) 80 MG tablet Take 1 tablet (80 mg total) by mouth daily at 6 PM. 04/20/16   Abelino Derrick, PA-C  Blood Glucose Monitoring Suppl (ONE TOUCH ULTRA 2) w/Device KIT Use as directed 04/27/16   Carlus Pavlov, MD  clopidogrel (PLAVIX) 75 MG tablet Take 1 tablet (75 mg total) by mouth daily. 04/21/16   Leone Brand, NP  diclofenac (FLECTOR) 1.3 % PTCH Place 1 patch onto the skin daily as needed (for pain).     Historical Provider, MD    gabapentin (NEURONTIN) 600 MG tablet Take 300 mg by mouth at bedtime.    Historical Provider, MD  glucose blood test strip Use as instructed 04/27/16   Carlus Pavlov, MD  HYDROcodone-acetaminophen (NORCO/VICODIN) 5-325 MG tablet Take 0.5-1 tablets by mouth every 6 (six) hours as needed for moderate pain.     Historical Provider, MD  insulin NPH-regular Human (NOVOLIN 70/30) (70-30) 100 UNIT/ML injection Inject 40-60 Units into the skin 2 (two) times daily. Pt uses 40 units in the morning and 60 units at bedtime.    Historical Provider, MD  Lancets Letta Pate ULTRASOFT) lancets Use as instructed 04/27/16   Carlus Pavlov, MD  losartan (COZAAR) 50 MG tablet Take 1 tablet (50 mg total) by mouth daily. 04/16/16   Jennifer Chahn-Yang Choi, DO  nitroGLYCERIN (NITROSTAT) 0.4 MG SL tablet Place 1 tablet (0.4 mg total) under the tongue every 5 (five) minutes x 3 doses as needed for chest pain. 04/20/16   Leone Brand, NP  ondansetron (ZOFRAN) 4 MG tablet Take 1 tablet (4 mg total) by mouth every 6 (six) hours. 04/22/16   Rise Mu, PA-C  rOPINIRole (REQUIP) 3 MG tablet Take 1 tablet (3 mg total) by mouth 4 (four) times daily. 04/26/16   Courtney Forcucci, PA-C  tiZANidine (ZANAFLEX) 4 MG tablet Take 4 mg by mouth every 6 (six) hours as needed for muscle spasms.     Historical Provider, MD     Social History   Social History  . Marital status: Widowed    Spouse name: N/A  . Number of children: N/A  . Years of education: N/A   Occupational History  . Not on file.   Social History Main Topics  . Smoking status: Former Smoker    Packs/day: 2.50    Years: 25.00    Types: Cigarettes    Quit date: 05/11/1983  . Smokeless tobacco: Former Neurosurgeon  . Alcohol use No  . Drug use: No  . Sexual activity: No   Other Topics  Concern  . Not on file   Social History Narrative  . No narrative on file    Family Status  Relation Status  . Mother Deceased  . Father Deceased  . Brother Deceased   . Neg Hx    Family History  Problem Relation Age of Onset  . Heart disease Mother   . Kidney disease Father   . Colon cancer Neg Hx   . Colon polyps Neg Hx   . Esophageal cancer Neg Hx   . Pancreatic cancer Neg Hx   . Stomach cancer Neg Hx   . Liver disease Neg Hx   . Diabetes Neg Hx      ROS:  Full 14 point review of systems complete and found to be negative unless listed above.  Physical Exam: Blood pressure 136/65, pulse 65, temperature 98 F (36.7 C), temperature source Oral, resp. rate 13, height '5\' 7"'$  (1.702 m), weight 227 lb (103 kg), SpO2 98 %.  General: Well developed, well nourished, female in no acute distress, morbid obesity Head: Eyes PERRLA, No xanthomas.   Normocephalic and atraumatic, oropharynx without edema or exudate.  Lungs: CTAB Heart: HRRR S1 S2, no rub/gallop, Heart regular rate and rhythm with S1, S2  murmur. pulses are 2+ extrem.   Neck: No carotid bruits. No lymphadenopathy.  No JVD. Abdomen: Bowel sounds present, abdomen soft and non-tender without masses or hernias noted. Msk:  No spine or cva tenderness. No weakness, no joint deformities or effusions. Extremities: No clubbing or cyanosis.  No edema.  Neuro: Alert and oriented X 3. No focal deficits noted. Psych:  Good affect, responds appropriately Skin: No rashes or lesions noted.  Labs:   Lab Results  Component Value Date   WBC 5.0 04/28/2016   HGB 11.7 (L) 04/28/2016   HCT 37.1 04/28/2016   MCV 90.5 04/28/2016   PLT 228 04/28/2016   No results for input(s): INR in the last 72 hours.   Recent Labs Lab 04/22/16 1149 04/28/16 1402  NA 140 142  K 4.4 4.2  CL 106 107  CO2 25 24  BUN 26* 19  CREATININE 1.26* 1.14*  CALCIUM 9.1 9.5  PROT 6.8  --   BILITOT 0.7  --   ALKPHOS 75  --   ALT 20  --   AST 22  --   GLUCOSE 161* 148*  ALBUMIN 4.0  --    Magnesium  Date Value Ref Range Status  10/21/2015 1.9 1.5 - 2.5 mg/dL Final   No results for input(s): CKTOTAL, CKMB, TROPONINI in  the last 72 hours.  Recent Labs  04/28/16 1411  TROPIPOC 0.02   Pro B Natriuretic peptide (BNP)  Date/Time Value Ref Range Status  06/27/2009 09:40 AM 23.0 0.0 - 100.0 pg/mL Final   Lab Results  Component Value Date   CHOL 251 (H) 04/17/2016   HDL 38 (L) 04/17/2016   LDLCALC 145 (H) 04/17/2016   TRIG 341 (H) 04/17/2016   Lab Results  Component Value Date   DDIMER 0.99 (H) 04/14/2016   Lipase  Date/Time Value Ref Range Status  04/22/2016 11:49 AM 27 11 - 51 U/L Final   TSH  Date/Time Value Ref Range Status  10/21/2015 05:18 PM 0.70 mIU/L Final    Comment:      Reference Range   > or = 20 Years  0.40-4.50   Pregnancy Range First trimester  0.26-2.66 Second trimester 0.55-2.73 Third trimester  0.43-2.91      Vitamin B-12  Date/Time  Value Ref Range Status  10/21/2015 05:18 PM 414 200 - 1,100 pg/mL Final   Folate  Date/Time Value Ref Range Status  05/15/2009 10:32 AM 11.6 ng/mL Final    Comment:     Reference Ranges        Deficient:       0.4 - 3.3 ng/mL        Indeterminate:   3.4 - 5.4 ng/mL        Normal:              > 5.4 ng/mL   Ferritin  Date/Time Value Ref Range Status  07/24/2015 02:31 PM 6 (L) 20 - 288 ng/mL Final   TIBC  Date/Time Value Ref Range Status  08/27/2015 03:54 PM 419 250 - 450 ug/dL Final   Iron  Date/Time Value Ref Range Status  08/27/2015 03:54 PM 50 45 - 160 ug/dL Final   Retic %  Date/Time Value Ref Range Status  05/15/2009 10:32 AM 2.90 (H) 0.50 - 1.50 % Final   Retic Ct Pct  Date/Time Value Ref Range Status  07/24/2015 02:31 PM 2.1 0.4 - 2.3 % Final    Echo:04/15/2016 LV EF: 50% -   55% Study Conclusions - Left ventricle: The cavity size was normal. There was moderate   focal basal and mild concentric hypertrophy. Systolic function   was normal. The estimated ejection fraction was in the range of   50% to 55%. There is akinesis of the distal septal and apical   myocardium. There is akinesis of the apicalinferior  myocardium.   There was an increased relative contribution of atrial   contraction to ventricular filling. Doppler parameters are   consistent with abnormal left ventricular relaxation (grade 1   diastolic dysfunction). Doppler parameters are consistent with   high ventricular filling pressure. - Aortic valve: Mildly to moderately calcified annulus. Mildly   calcified leaflets. - Mitral valve: Calcified annulus. Mildly calcified leaflets .   There was trivial regurgitation. - Atrial septum: No defect or patent foramen ovale was identified.   04/15/16 Coronary Stent Intervention  Left Heart Cath and Coronary Angiography  Conclusion    Non-ST elevation myocardial infarction with identification of 01-02% segmental mid LAD stenosis and focal eccentric 95-99% mid circumflex stenosis.  The first diagonal is small and contains segmental 80% stenosis. The coronaries are otherwise widely patent.  Successful drug-eluting stent using Onyx Resolute to reduce a 95% mid LAD stenosis to 0% with TIMI grade 3 flow. Final stent diameter 3.0 mm.  Successful drug-eluting stent using Onyx Resolute to reduce a 95% mid circumflex stenosis to 0% with TIMI grade 3 flow. Post stent diameter 3.5 mm. Intervention on this vessel was, complicated by microembolization to the the most distal obtuse marginal beyond a bifurcation.  Mild hypokinesis of the anterior wall. EF 40-50%.      ECG:  NSR   Radiology:  Dg Chest 2 View  Result Date: 04/28/2016 CLINICAL DATA:  Chest pain, shortness of breath, nausea EXAM: CHEST  2 VIEW COMPARISON:  04/16/2016 FINDINGS: The heart size and mediastinal contours are within normal limits. Both lungs are clear. The visualized skeletal structures are unremarkable. IMPRESSION: No active cardiopulmonary disease. Electronically Signed   By: Kathreen Devoid   On: 04/28/2016 14:54    ASSESSMENT AND PLAN:    Principal Problem:   Other chest pain Active Problems:   Hyperlipidemia    Essential hypertension   Morbid obesity   Poorly controlled type 2 diabetes mellitus with peripheral  neuropathy (HCC)   BMI 38.0-38.9,adult   OSA on CPAP   CKD (chronic kidney disease), stage III   Back pain   S/P coronary artery stent placement   CAD in native artery  Janet Choi is a 75 y.o. female with a history of anemia, diabetes c/b neuropathy, HTN, hyperlipidemia, CKD stage III, morbid obesity, OSA on CPAP, and recently diagnosed CAD with NSTEMI s/p DES toLAD and LCx, ICM (EF 40-50% by cath and 50-55% by echo 04/15/16) who presents to [facility] with recurrent chest pain.   Chest pain: with no objective sign of ischemia at this point. ECG with no acute ST/TW changes. Troponin neg x1. I think she would be okay to go home if delta troponin is negative. There may be an anxiety component to all of this.   CAD: Dr. Acie Fredrickson reviewed cath films and felt like there was significant enough diffuse CAD to cause angina. Plan to add imdur 30 mg daily. Continue ASA, plavix, statin. No BB 2/2 bradycardia.   HTN: BP well controlled on current regimen   DMT2: continue current regimen  HLD: continue statin   Depression/anxiety: follow up with PCP to see if there are any other antidepressants besides prozac that wont interact with plavix.   CKD: creat stable.   SMA stenosis: has been referred to [facility]   Signed: Angelena Form, PA-C 04/28/2016 4:12 PM  Pager [phone]  Co-Sign MD  Attending Note:   The patient was seen and examined.  Agree with assessment and plan as noted above.  Changes made to the above note as needed.  Patient seen and independently examined with Nell Range, PA .   We discussed all aspects of the encounter. I agree with the assessment and plan as stated above.  1. Coronary artery disease: I have personally reviewed the angiogram from December 8. She had stenting of her left anterior descending artery and her left circumflex proximal artery. These final result looks  very good. She does have a first diagonal vessel that is small but is fairly long and supplies a fair amount of myocardium. This vessel is moderately to severely diffusely diseased. Her angina symptoms could be coming from this diagonal vessel.  Her initial troponin is negative. We will check a delta troponin and will be able to discharge her from the emergency room if the second troponin is normal. Her heart rate is too slow to add a beta blocker but we will add isosorbide mononitrate 30 mg a day. She thinks that her episodes of chest discomfort are related to anxiety. I've advised her to talk to her doctor about getting additional medications for anxiety.  She'll follow-up with [place] in one to 2 weeks.   I have spent a total of 40 minutes with patient reviewing hospital  notes , telemetry, EKGs, labs and examining patient as well as establishing an assessment and plan that was discussed with the patient. > 50% of time was spent in direct patient care.    Thayer Headings, Brooke Bonito., MD, [facility] 04/28/2016, 4:42 PM [address]  [place] Pager [phone]

## 2016-04-28 NOTE — ED Triage Notes (Signed)
Pt in from home via [facility] with c/o central NR pressure since 0700 this am. Hx of MI 2 wks ago with 2 stents placed. Today pt took 3 NTG at home with no relief, had 4 ASA's by EMS. C/o nausea and dizziness. Alert, a&ox4, states pain 10/10.

## 2016-04-28 NOTE — ED Provider Notes (Signed)
Care assumed at shift change awaiting a delta troponin. Patient was evaluated by cardiology who does not feel as though the patient requires admission so long as that second troponin is negative, which it is. She will be discharged to home with a prescription for isosorbide 30 mg daily and follow-up follow-up with her cardiologist. She understands to return if her symptoms worsen or change.   Veryl Speak, MD 04/28/16 [phone]

## 2016-04-28 NOTE — ED Notes (Signed)
Placed patient into a gown and on the monitor 

## 2016-04-29 ENCOUNTER — Ambulatory Visit: Payer: Self-pay | Admitting: Physician Assistant

## 2016-04-30 DIAGNOSIS — G4733 Obstructive sleep apnea (adult) (pediatric): Secondary | ICD-10-CM | POA: Diagnosis not present

## 2016-05-13 ENCOUNTER — Ambulatory Visit (INDEPENDENT_AMBULATORY_CARE_PROVIDER_SITE_OTHER): Payer: PPO | Admitting: Internal Medicine

## 2016-05-13 ENCOUNTER — Encounter: Payer: Self-pay | Admitting: Internal Medicine

## 2016-05-13 VITALS — BP 128/82 | HR 91 | Ht 61.5 in | Wt 229.0 lb

## 2016-05-13 DIAGNOSIS — E1142 Type 2 diabetes mellitus with diabetic polyneuropathy: Secondary | ICD-10-CM | POA: Diagnosis not present

## 2016-05-13 DIAGNOSIS — E1165 Type 2 diabetes mellitus with hyperglycemia: Secondary | ICD-10-CM

## 2016-05-13 NOTE — Progress Notes (Signed)
Patient ID: Janet Choi, female   DOB: 12/30/1940, 76 y.o.   MRN: [id number]\  HPI: Janet Choi is a 76 y.o.-year-old female, returning for f/u for DM2, dx in ~2009, insulin-dependent since 10/2015, uncontrolled, with complications (CAD - s/p NSTEMI, CKD, PN). She is here with her sister who offers part of the hx. Last visit 2 mo ago.  Since last visit, she had an NSTEMI 04/14/2016 >> 2 stents. She was in ED for CP again 04/16/2016 and 04/28/2016. She also had a kidney stone.  She was taken off Prozac >> now on Xanax.   Last hemoglobin A1c was: Lab Results  Component Value Date   HGBA1C 10.3 (H) 04/14/2016   HGBA1C 9.7 03/16/2016   HGBA1C 11.1 (H) 12/19/2015   Pt is on a regimen of 70/30 insulin * 40 units before b'fast * 55 units before dinner tonight and if no lows >> 60 units before dinner Previously: Metformin and Glimepiride. She was on Victoza.  Pt checks her sugars 1-2x a day and they are: - am: 109-263, 325 >> 109-170, 205 - 2h after b'fast: n/c - before lunch: n/c - 2h after lunch: 237 >> n/c - before dinner: 181 >> n/c  - 2h after dinner: n/c - bedtime: n/c - nighttime: n/c No lows. Lowest sugar was 325; ? hypoglycemia awareness. Highest sugar was 109 recently, prev. 86.  Glucometer: One Touch Ultra  Pt's meals are: - Breakfast: bagel or 2 pieces toast; coffee + sugar + powdered creamer - Lunch: 1/2 PB sandwich or goes out - shares a plate with her sister - Dinner: mac and cheese + veggies; chicken + veggies + some starch - Snacks: after dinner (dinner is at 6 pm, she goes to bed at 1 -2 am) - popcorn, peanuts, sunflower seeds, candy  - + CKD, last BUN/creatinine:  Lab Results  Component Value Date   BUN 19 04/28/2016   BUN 26 (H) 04/22/2016   CREATININE 1.14 (H) 04/28/2016   CREATININE 1.26 (H) 04/22/2016  On Losartan.  - last set of lipids: Lab Results  Component Value Date   CHOL 251 (H) 04/17/2016   HDL 38 (L) 04/17/2016   LDLCALC 145 (H)  04/17/2016   TRIG 341 (H) 04/17/2016   CHOLHDL 6.6 04/17/2016   - last eye exam was on 02/2016. No DR. + Glaucoma. Dr. Satira Sark. - + numbness and tingling in her feet. Has a podiatrist (Dr. Bjorn Loser).  She also has a history of HL, anxiety/depression.  ROS: Constitutional: no weight gain,  no fatigue, no subjective hyperthermia/hypothermia Eyes: no blurry vision, no xerophthalmia ENT: no sore throat, no nodules palpated in throat, no dysphagia/odynophagia, no hoarseness Cardiovascular: resolved CP/SOB/palpitations/+ leg swelling Respiratory: no cough/no SOB Gastrointestinal: no N/V/D/C Musculoskeletal: no muscle/+ joint aches Skin: no rashes Neurological: no tremors/numbness/tingling/dizziness, + headache  I reviewed pt's medications, allergies, PMH, social hx, family hx, and changes were documented in the history of present illness. Otherwise, unchanged from my initial visit note.  Past Medical History:  Diagnosis Date  . Anemia    hx (04/14/2016)  . Anxiety   . Arthritis    "severe in my back; hands; ankles" (04/14/2016)  . Basal cell carcinoma    "several burned off; one cut off"  . CAD in native artery    a. NSTEMI 04/2016 - s/p DES toLAD and LCx. PCI to LCx notable for microembolization during cath.  . Chest pain- reslved with stopping Brilinta now on Plaix 04/16/2016  . Chronic lower back  pain   . CKD (chronic kidney disease), stage III   . Depression   . DJD (degenerative joint disease)   . Family history of adverse reaction to anesthesia    "half-sister used to get real sick" (04/14/2016)  . GERD (gastroesophageal reflux disease)   . History of gout   . Hyperlipidemia   . Hypertension   . IBS (irritable bowel syndrome)   . Ischemic cardiomyopathy    a. 04/2016: EF 40-50% by cath, 50-55% +WMA by echo.  . 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 ([place]) ~ 2015  . Morbid obesity ([place])   . NSTEMI (non-ST elevated myocardial infarction) ([place]) 04/14/2016  . OSA on CPAP   .  Peripheral neuropathy ([place])   . RLS (restless legs syndrome)   . Spinal stenosis   . Type II diabetes mellitus ([place])   . Vitamin D deficiency    Past Surgical History:  Procedure Laterality Date  . APPENDECTOMY    . BACK SURGERY    . BASAL CELL CARCINOMA EXCISION Left    leg  . CARDIAC CATHETERIZATION N/A 04/15/2016   Procedure: Left Heart Cath and Coronary Angiography;  Surgeon: Belva Crome, MD;  Location: [facility];  Service: Cardiovascular;  Laterality: N/A;  . CARDIAC CATHETERIZATION N/A 04/15/2016   Procedure: Coronary Stent Intervention;  Surgeon: Belva Crome, MD;  Location: [facility];  Service: Cardiovascular;  Laterality: N/A;  Mid LAD Mid CFX  . CATARACT EXTRACTION, BILATERAL Bilateral   . JOINT REPLACEMENT    . LUMBAR DISC SURGERY  X 2  . MELANOMA EXCISION Left    "towards the back of my neck"  . SHOULDER ARTHROSCOPY W/ ROTATOR CUFF REPAIR Left   . SHOULDER OPEN ROTATOR CUFF REPAIR Right   . TOTAL KNEE ARTHROPLASTY Bilateral    Social History   Social History  . Marital status: Single    Spouse name: N/A  . Number of children: 1   Occupational History  . Retired, works part-time, 16 hours a week    Social History Main Topics  . Smoking status: Former Smoker    Quit date: 05/11/1983  . Smokeless tobacco: Never Used  . Alcohol use No  . Drug use: No   Current Outpatient Prescriptions on File Prior to Visit  Medication Sig Dispense Refill  . acetaminophen (TYLENOL) 325 MG tablet Take 2 tablets (650 mg total) by mouth every 4 (four) hours as needed for headache or mild pain.    Marland Kitchen ALPRAZolam (XANAX) 0.25 MG tablet Take 0.125-0.25 mg by mouth 3 (three) times daily as needed for anxiety.    Marland Kitchen aspirin EC 81 MG EC tablet Take 1 tablet (81 mg total) by mouth daily.    Marland Kitchen atorvastatin (LIPITOR) 80 MG tablet Take 1 tablet (80 mg total) by mouth daily at 6 PM. 90 tablet 3  . Blood Glucose Monitoring Suppl (ONE TOUCH ULTRA 2) w/Device KIT Use as directed 1  each 0  . clopidogrel (PLAVIX) 75 MG tablet Take 1 tablet (75 mg total) by mouth daily. 30 tablet 6  . diclofenac (FLECTOR) 1.3 % PTCH Place 1 patch onto the skin daily as needed (for pain).     Marland Kitchen gabapentin (NEURONTIN) 600 MG tablet Take 300 mg by mouth at bedtime.    Marland Kitchen glucose blood test strip Use as instructed 100 each 12  . HYDROcodone-acetaminophen (NORCO/VICODIN) 5-325 MG tablet Take 0.5-1 tablets by mouth every 6 (six) hours as needed for moderate pain.  0  . insulin NPH-regular Human (NOVOLIN 70/30) (70-30) 100 UNIT/ML injection Inject 40-60 Units into the skin 2 (two) times daily. Pt uses 40 units in the morning and 60 units at bedtime.    . isosorbide mononitrate (IMDUR) 30 MG 24 hr tablet Take 1 tablet (30 mg total) by mouth daily. 30 tablet 0  . Lancets (ONETOUCH ULTRASOFT) lancets Use as instructed 100 each 12  . losartan (COZAAR) 50 MG tablet Take 1 tablet (50 mg total) by mouth daily. 30 tablet 0  . nitroGLYCERIN (NITROSTAT) 0.4 MG SL tablet Place 1 tablet (0.4 mg total) under the tongue every 5 (five) minutes x 3 doses as needed for chest pain. 25 tablet 4  . ondansetron (ZOFRAN) 4 MG tablet Take 1 tablet (4 mg total) by mouth every 6 (six) hours. 12 tablet 0  . rOPINIRole (REQUIP) 3 MG tablet Take 1 tablet (3 mg total) by mouth 4 (four) times daily. 90 tablet 1  . tiZANidine (ZANAFLEX) 4 MG tablet Take 4 mg by mouth every 6 (six) hours as needed for muscle spasms.   0   No current facility-administered medications on file prior to visit.    Allergies  Allergen Reactions  . Brilinta [Ticagrelor] Shortness Of Breath and Other (See Comments)    Reaction:  Chest pain   . Lyrica [Pregabalin] Other (See Comments)    Reaction: Blurry vision   . Aspirin Other (See Comments)    Reaction:  Unknown  Pt is able to take in small doses.   . Crestor [Rosuvastatin] Other (See Comments)    Reaction:  Leg cramps   . Lipitor [Atorvastatin] Other (See Comments)    Reaction:  Leg cramps   Pt is currently on this medication.    . Minocycline Hcl Other (See Comments)    Reaction:  Unknown   . Oruvail [Ketoprofen] Other (See Comments)    Reaction:  Unknown   . Tricor [Fenofibrate] Other (See Comments)    Reaction:  Unknown   . Vasotec [Enalaprilat] Other (See Comments)    Reaction:  Unknown   . Victoza [Liraglutide] Other (See Comments)    Reaction:  Unknown   . Zinc Other (See Comments)    Reaction:  Unknown    Family History  Problem Relation Age of Onset  . Heart disease Mother   . Kidney disease Father   . Colon cancer Neg Hx   . Colon polyps Neg Hx   . Esophageal cancer Neg Hx   . Pancreatic cancer Neg Hx   . Stomach cancer Neg Hx   . Liver disease Neg Hx   . Diabetes Neg Hx     PE: BP 128/82 (BP Location: Left Arm, Patient Position: Sitting)   Pulse 91   Ht 5' 1.5" (1.562 m)   Wt 229 lb (103.9 kg)   SpO2 96%   BMI 42.57 kg/m  Wt Readings from Last 3 Encounters:  05/13/16 229 lb (103.9 kg)  04/28/16 227 lb (103 kg)  04/26/16 227 lb (103 kg)   Constitutional:Obese, in NAD Eyes: PERRLA, EOMI, no exophthalmos ENT: moist mucous membranes, no thyromegaly, no cervical lymphadenopathy Cardiovascular: RRR, No MRG, + Pitting edema bilaterally Respiratory: CTA B Gastrointestinal: abdomen soft, NT, ND, BS+ Musculoskeletal: no deformities, strength intact in all 4 Skin: moist, warm, no rashes Neurological: no tremor with outstretched hands, DTR normal in all 4  ASSESSMENT: 1. DM2, insulin-dependent, uncontrolled, with complications - CAD - s/p NSTEMI - PN - CKD  PLAN:  1.  Patient with long-standing, uncontrolled diabetes, on oral antidiabetic regimen, on premixed insulin.  - HbA1c was 9.7% at last visit (better) >> then 10.3% last month per check by PCP. - she was taking her insulin w/o a relationship with the meals >> we moved it 30 minutes before meals at last visit. Also, since she was eating much more at night than during the day, we decreased  the dose of insulin with breakfast and increased the one with dinner. Unfortunately, at this visit, she only has several blood sugars checked and these are all in a.m. Therefore, I cannot make changes in her regimen at this point. I suspect that her sugars improved after her stents, but I advised her to start checking sugars at different times of the day to confirm this. I will have her come back in a month and a half with her log - We again discussed with her and her sister about the fact that we may not be able to bring her control to the target level on premixed insulin regimen, and we may need to split into a basal-bolus regimen. We will need to decide about this at next visit, with more CBG data available - We also discussed about healthier snacks before bedtime. - I suggested to:  Patient Instructions  Please continue Novolin 70/30 30 min before a meal * 40 units before b'fast * 60 units before dinner   Try to change the snacks at night: - unsalted nuts - veggies + humus - veggies + avocado spread - berries or other less sweet fruits - almond milk  Please return in 1.5 months with your sugar log.   - continue checking sugars at different times of the day - check 2 times a day, rotating checks - advised for yearly eye exams >> She is up-to-date - Return to clinic in 1.5 mo with sugar log   Philemon Kingdom, MD PhD [facility]

## 2016-05-13 NOTE — Patient Instructions (Addendum)
Please continue Novolin 70/30 30 min before a meal * 40 units before b'fast * 60 units before dinner   Try to change the snacks at night: - unsalted nuts - veggies + humus - veggies + avocado spread - berries or other less sweet fruits - almond milk  Please return in 1.5 months with your sugar log.

## 2016-05-14 ENCOUNTER — Encounter: Payer: Self-pay | Admitting: Vascular Surgery

## 2016-05-14 ENCOUNTER — Encounter: Payer: Self-pay | Admitting: *Deleted

## 2016-05-14 ENCOUNTER — Encounter: Payer: PPO | Attending: Internal Medicine | Admitting: Dietician

## 2016-05-14 ENCOUNTER — Encounter: Payer: Self-pay | Admitting: Dietician

## 2016-05-14 DIAGNOSIS — I129 Hypertensive chronic kidney disease with stage 1 through stage 4 chronic kidney disease, or unspecified chronic kidney disease: Secondary | ICD-10-CM | POA: Diagnosis not present

## 2016-05-14 DIAGNOSIS — E559 Vitamin D deficiency, unspecified: Secondary | ICD-10-CM | POA: Diagnosis not present

## 2016-05-14 DIAGNOSIS — G629 Polyneuropathy, unspecified: Secondary | ICD-10-CM | POA: Diagnosis not present

## 2016-05-14 DIAGNOSIS — I252 Old myocardial infarction: Secondary | ICD-10-CM | POA: Diagnosis not present

## 2016-05-14 DIAGNOSIS — I10 Essential (primary) hypertension: Secondary | ICD-10-CM | POA: Insufficient documentation

## 2016-05-14 DIAGNOSIS — E1165 Type 2 diabetes mellitus with hyperglycemia: Secondary | ICD-10-CM

## 2016-05-14 DIAGNOSIS — E785 Hyperlipidemia, unspecified: Secondary | ICD-10-CM | POA: Diagnosis not present

## 2016-05-14 DIAGNOSIS — E1142 Type 2 diabetes mellitus with diabetic polyneuropathy: Secondary | ICD-10-CM

## 2016-05-14 DIAGNOSIS — E1122 Type 2 diabetes mellitus with diabetic chronic kidney disease: Secondary | ICD-10-CM | POA: Diagnosis not present

## 2016-05-14 DIAGNOSIS — Z713 Dietary counseling and surveillance: Secondary | ICD-10-CM | POA: Insufficient documentation

## 2016-05-14 DIAGNOSIS — I2510 Atherosclerotic heart disease of native coronary artery without angina pectoris: Secondary | ICD-10-CM | POA: Diagnosis not present

## 2016-05-14 DIAGNOSIS — N189 Chronic kidney disease, unspecified: Secondary | ICD-10-CM | POA: Insufficient documentation

## 2016-05-14 NOTE — Progress Notes (Signed)
Diabetes Self-Management Education  Visit Type: First/Initial  Appt. Start Time: 1410 Appt. End Time: [id number]  05/14/2016  [name], identified by name and date of birth, is a 76 y.o. female with a diagnosis of Diabetes: Type 2. Other hx includes MI in December, neuropathy, CAD, HTN, hyperlipidemia, CKD (GFR 46).  She also has a hx of vitamin D deficiency and stopped taking her supplement.  She is taking her blood sugar once daily and has been instructed to take it 2-3 times daily to hep with insulin management.  She is currently taking 40 units of Novolin 70/30 30 minutes prior to breakfast and 60 units prior to dinner daily.  Patient lives with a boarder.  She shops and cooks.  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  She still has access to [place].  Exercise is limited due to increased back problems and pain.  She is using a KCL salt substitute and cautioned against this due to her CKD history.     ASSESSMENT  Height 5\' 5"  (1.651 m), weight 228 lb (103.4 kg). Body mass index is 37.94 kg/m.      Diabetes Self-Management Education - 05/14/16 1422      Visit Information   Visit Type First/Initial     Initial Visit   Diabetes Type Type 2   Are you currently following a meal plan? Yes  trying to reduce carbs and sweets   What type of meal plan do you follow? reduced carbohydrates and sweets   Are you taking your medications as prescribed? Yes   Date Diagnosed 2009  on insulin since 10/2015     Health Coping   How would you rate your overall health? Good     Psychosocial Assessment   Patient Belief/Attitude about Diabetes Motivated to manage diabetes   Self-care barriers None   Self-management support Doctor's office;Family   Other persons present Patient;Family Member  sister   Patient Concerns Nutrition/Meal planning   Special Needs None   Preferred Learning Style No  preference indicated   Learning Readiness Ready   How often do you need to have someone help you when you read instructions, pamphlets, or other written materials from your doctor or pharmacy? 1 - Never   What is the last grade level you completed in school? 12th grade     Pre-Education Assessment   Patient understands the diabetes disease and treatment process. Needs Instruction   Patient understands incorporating nutritional management into lifestyle. Needs Instruction   Patient undertands incorporating physical activity into lifestyle. Needs Instruction   Patient understands using medications safely. Needs Review   Patient understands monitoring blood glucose, interpreting and using results Needs Review   Patient understands prevention, detection, and treatment of acute complications. Needs Instruction   Patient understands prevention, detection, and treatment of chronic complications. Demonstrates understanding / competency   Patient understands how to develop strategies to address psychosocial issues. Needs Instruction   Patient understands how to develop strategies to promote health/change behavior. Needs Instruction     Complications   Last HgB A1C per patient/outside source 10.3 %  04/14/16   How often do you check your blood sugar? 1-2 times/day   Fasting Blood glucose range (mg/dL) 130-179;70-129   Number of hypoglycemic episodes per month 0   Number of hyperglycemic episodes per week 2   Can you tell when your blood sugar is high? No  Have you had a dilated eye exam in the past 12 months? Yes   Have you had a dental exam in the past 12 months? Yes   Are you checking your feet? Yes   How many days per week are you checking your feet? 7     Dietary Intake   Breakfast Coffee and 2 thin slices [employer] toast with butter (smaller amounts than previous and now no jelly) and occasional egg OR oatmeal with brown sugar, seeds, milk, butter  8-8:30   Snack (morning) none   Lunch 1/2 PB  sandwich and diet gingerale OR whole banana and peanut butter sandwich OR shares a vegetable plate when out to eat OR soup  12-12:30   Snack (afternoon) [employer] ritz crackers with PB or cheese OR graham crackers and PB OR snack pack vanilla wafers  3:30-4   Dinner meat, vegetables OR steak sandwich and 4 friench fries   Snack (evening) peanut OR popcorn OR crackers and PB and cheese OR pork rinds OR single serving teddy grahams   Beverage(s) occasional 1% milk, diet gingerale, water, coke zero (rarely), sweetened tea but working on reducing the sugar.     Exercise   Exercise Type ADL's  To start cardiac rehab at [facility] and wants to start walking program.   How many days per week to you exercise? 0   How many minutes per day do you exercise? 0   Total minutes per week of exercise 0     Patient Education   Previous Diabetes Education No   Disease state  Definition of diabetes, type 1 and 2, and the diagnosis of diabetes;Explored patient's options for treatment of their diabetes   Nutrition management  Role of diet in the treatment of diabetes and the relationship between the three main macronutrients and blood glucose level;Food label reading, portion sizes and measuring food.;Meal options for control of blood glucose level and chronic complications.;Meal timing in regards to the patients' current diabetes medication.   Physical activity and exercise  Role of exercise on diabetes management, blood pressure control and cardiac health.;Helped patient identify appropriate exercises in relation to his/her diabetes, diabetes complications and other health issue.   Monitoring Identified appropriate SMBG and/or A1C goals.;Yearly dilated eye exam;Daily foot exams   Acute complications Taught treatment of hypoglycemia - the 15 rule.   Chronic complications Relationship between chronic complications and blood glucose control   Psychosocial adjustment Role of stress on diabetes;Worked with patient to identify  barriers to care and solutions;Identified and addressed patients feelings and concerns about diabetes   Personal strategies to promote health Lifestyle issues that need to be addressed for better diabetes care     Individualized Goals (developed by patient)   Nutrition General guidelines for healthy choices and portions discussed   Physical Activity Exercise 3-5 times per week;15 minutes per day   Medications take my medication as prescribed   Monitoring  test my blood glucose as discussed   Reducing Risk examine blood glucose patterns   Health Coping discuss diabetes with (comment)  MD/RD/sister     Post-Education Assessment   Patient understands the diabetes disease and treatment process. Demonstrates understanding / competency   Patient understands incorporating nutritional management into lifestyle. Demonstrates understanding / competency   Patient undertands incorporating physical activity into lifestyle. Demonstrates understanding / competency   Patient understands using medications safely. Demonstrates understanding / competency   Patient understands monitoring blood glucose, interpreting and using results Demonstrates understanding / competency   Patient understands  prevention, detection, and treatment of acute complications. Demonstrates understanding / competency   Patient understands prevention, detection, and treatment of chronic complications. Demonstrates understanding / competency   Patient understands how to develop strategies to address psychosocial issues. Demonstrates understanding / competency   Patient understands how to develop strategies to promote health/change behavior. Demonstrates understanding / competency     Outcomes   Expected Outcomes Demonstrated interest in learning. Expect positive outcomes   Future DMSE PRN   Program Status Completed      Individualized Plan for Diabetes Self-Management Training:   Learning Objective:  Patient will have a greater  understanding of diabetes self-management. Patient education plan is to attend individual and/or group sessions per assessed needs and concerns.   Plan:   Patient Instructions  Reduce fat.  Choose the leanest meat, bake, broil or grill, meatless meals. Avoid added salt, avoid processed meat, choose fresh or frozen vegetables and rinse canned vegetables. Increase non starchy vegetables. Choose beverages without carbohydrates. Stay as active as possible (consider physical therapy, cardiac rehab, armchair exercises, walking).  Plan:  Aim for 2-3 Carb Choices per meal (30-45 grams) +/- 1 either way  Aim for 0-1 Carbs per snack if hungry  Include protein in moderation with your meals and snacks Consider reading food labels for Total Carbohydrate and Fat Grams of foods Continue checking BG at alternate times per day as directed by MD  Continue taking medication as directed by MD       Expected Outcomes:  Demonstrated interest in learning. Expect positive outcomes  Education material provided: Living Well with Diabetes, Food label handouts, A1C conversion sheet, Meal plan card, My Plate, Snack sheet and Support group flyer, Guide to Weight Management magazine in the office.  If problems or questions, patient to contact team via:  Phone and Email  Future DSME appointment: PRN

## 2016-05-14 NOTE — Patient Instructions (Addendum)
Reduce fat.  Choose the leanest meat, bake, broil or grill, meatless meals. Avoid added salt, avoid processed meat, choose fresh or frozen vegetables and rinse canned vegetables. Increase non starchy vegetables. Choose beverages without carbohydrates. Stay as active as possible (consider physical therapy, cardiac rehab, armchair exercises, walking).  Plan:  Aim for 2-3 Carb Choices per meal (30-45 grams) +/- 1 either way  Aim for 0-1 Carbs per snack if hungry  Include protein in moderation with your meals and snacks Consider reading food labels for Total Carbohydrate and Fat Grams of foods Continue checking BG at alternate times per day as directed by MD  Continue taking medication as directed by MD

## 2016-05-20 ENCOUNTER — Encounter: Payer: Self-pay | Admitting: Vascular Surgery

## 2016-05-20 ENCOUNTER — Other Ambulatory Visit: Payer: Self-pay | Admitting: Internal Medicine

## 2016-05-25 ENCOUNTER — Ambulatory Visit (INDEPENDENT_AMBULATORY_CARE_PROVIDER_SITE_OTHER): Payer: PPO | Admitting: Vascular Surgery

## 2016-05-25 ENCOUNTER — Encounter: Payer: Self-pay | Admitting: Vascular Surgery

## 2016-05-25 VITALS — BP 114/70 | HR 80 | Temp 97.8°F | Resp 20 | Ht 65.0 in | Wt 230.0 lb

## 2016-05-25 DIAGNOSIS — I771 Stricture of artery: Secondary | ICD-10-CM | POA: Diagnosis not present

## 2016-05-25 DIAGNOSIS — K551 Chronic vascular disorders of intestine: Secondary | ICD-10-CM

## 2016-05-25 NOTE — Progress Notes (Signed)
[facility] [place]  Patient name: Janet Choi MRN: [id number] DOB: 10-04-1940 Sex: female  REASON FOR CONSULT: Evaluation for incidental findings. Mesenteric artery stenosis  HPI: Janet Choi is a 76 y.o. female, who is here today for evaluation of this. Mesenteric artery stenosis. She is very pleasant 76 year old female who underwent CT of her abdomen in December 2017 for evaluation of abdominal pain. Her diagnosis was a kidney stone and this past and her pain nausea and vomiting completely resolved. She does have a history of diverticulosis as well but no diverticulitis was seen on the recent CT scan. She did have an incidental finding of extensive calcified plaque at the origin of her spare mesenteric artery that looks to be subtotally occluded. She has no symptoms referable to this. Specifically she has had no weight loss or postprandial pain. She is morbidly obese. She has no history of peripheral vascular occlusive disease. She does have some disability related to severe chronic low back pain.  Past Medical History:  Diagnosis Date  . Anemia    hx (04/14/2016)  . Anxiety   . Arthritis    "severe in my back; hands; ankles" (04/14/2016)  . Basal cell carcinoma    "several burned off; one cut off"  . CAD in native artery    a. NSTEMI 04/2016 - s/p DES toLAD and LCx. PCI to LCx notable for microembolization during cath.  . Chest pain- reslved with stopping Brilinta now on Plaix 04/16/2016  . Chronic lower back pain   . CKD (chronic kidney disease), stage III   . Depression   . DJD (degenerative joint disease)   . Family history of adverse reaction to anesthesia    "half-sister used to get real sick" (04/14/2016)  . GERD (gastroesophageal reflux disease)   . History of gout   . Hyperlipidemia   . Hypertension   . IBS (irritable bowel syndrome)   . Ischemic cardiomyopathy    a. 04/2016: EF 40-50% by cath, 50-55% +WMA by  echo.  . 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 ([place]) ~ 2015  . Morbid obesity ([place])   . NSTEMI (non-ST elevated myocardial infarction) ([place]) 04/14/2016  . OSA on CPAP   . Peripheral neuropathy ([place])   . Peripheral vascular disease ([place])   . RLS (restless legs syndrome)   . Spinal stenosis   . Type II diabetes mellitus ([place])   . Vitamin D deficiency     Family History  Problem Relation Age of Onset  . Heart disease Mother   . Kidney disease Father   . AAA (abdominal aortic aneurysm) Father   . Heart disease Son   . Colon cancer Neg Hx   . Colon polyps Neg Hx   . Esophageal cancer Neg Hx   . Pancreatic cancer Neg Hx   . Stomach cancer Neg Hx   . Liver disease Neg Hx   . Diabetes Neg Hx     SOCIAL HISTORY: Social History   Social History  . Marital status: Widowed    Spouse name: N/A  . Number of children: N/A  . Years of education: N/A   Occupational History  . Not on file.   Social History Main Topics  . Smoking status: Former Smoker    Packs/day: 2.50    Years: 25.00    Types: Cigarettes    Quit date: 05/11/1983  . Smokeless tobacco: Former Systems developer  . Alcohol use No  . Drug use: No  .  Sexual activity: No   Other Topics Concern  . Not on file   Social History Narrative  . No narrative on file    Allergies  Allergen Reactions  . Brilinta [Ticagrelor] Shortness Of Breath and Other (See Comments)    Reaction:  Chest pain   . Lyrica [Pregabalin] Other (See Comments)    Reaction: Blurry vision   . Aspirin Other (See Comments)    Reaction:  Unknown  Pt is able to take in small doses.   . Crestor [Rosuvastatin] Other (See Comments)    Reaction:  Leg cramps   . Lipitor [Atorvastatin] Other (See Comments)    Reaction:  Leg cramps  Pt is currently on this medication.    . Minocycline Hcl Other (See Comments)    Reaction:  Unknown   . Oruvail [Ketoprofen] Other (See Comments)    Reaction:  Unknown   . Tricor [Fenofibrate] Other (See Comments)    Reaction:   Unknown   . Vasotec [Enalaprilat] Other (See Comments)    Reaction:  Unknown   . Victoza [Liraglutide] Other (See Comments)    Reaction:  Unknown   . Zinc Other (See Comments)    Reaction:  Unknown     Current Outpatient Prescriptions  Medication Sig Dispense Refill  . acetaminophen (TYLENOL) 325 MG tablet Take 2 tablets (650 mg total) by mouth every 4 (four) hours as needed for headache or mild pain.    Marland Kitchen ALPRAZolam (XANAX) 0.25 MG tablet Take 0.125-0.25 mg by mouth 3 (three) times daily as needed for anxiety.    Marland Kitchen aspirin EC 81 MG EC tablet Take 1 tablet (81 mg total) by mouth daily.    Marland Kitchen atorvastatin (LIPITOR) 80 MG tablet Take 1 tablet (80 mg total) by mouth daily at 6 PM. 90 tablet 3  . Blood Glucose Monitoring Suppl (ONE TOUCH ULTRA 2) w/Device KIT Use as directed 1 each 0  . clopidogrel (PLAVIX) 75 MG tablet Take 1 tablet (75 mg total) by mouth daily. 30 tablet 6  . diclofenac (FLECTOR) 1.3 % PTCH Place 1 patch onto the skin daily as needed (for pain).     Marland Kitchen gabapentin (NEURONTIN) 600 MG tablet Take 300 mg by mouth at bedtime.    Marland Kitchen glucose blood test strip Use as instructed 100 each 12  . HYDROcodone-acetaminophen (NORCO/VICODIN) 5-325 MG tablet Take 0.5-1 tablets by mouth every 6 (six) hours as needed for moderate pain.   0  . insulin NPH-regular Human (NOVOLIN 70/30) (70-30) 100 UNIT/ML injection Inject 40-60 Units into the skin 2 (two) times daily. Pt uses 40 units in the morning and 60 units at bedtime.    . isosorbide mononitrate (IMDUR) 30 MG 24 hr tablet Take 1 tablet (30 mg total) by mouth daily. 30 tablet 0  . Lancets (ONETOUCH ULTRASOFT) lancets Use as instructed 100 each 12  . losartan (COZAAR) 50 MG tablet TAKE 1 TABLET (50 MG TOTAL) BY MOUTH DAILY. 90 tablet 1  . nitroGLYCERIN (NITROSTAT) 0.4 MG SL tablet Place 1 tablet (0.4 mg total) under the tongue every 5 (five) minutes x 3 doses as needed for chest pain. 25 tablet 4  . ondansetron (ZOFRAN) 4 MG tablet Take 1 tablet  (4 mg total) by mouth every 6 (six) hours. 12 tablet 0  . rOPINIRole (REQUIP) 3 MG tablet Take 1 tablet (3 mg total) by mouth 4 (four) times daily. 90 tablet 1  . tiZANidine (ZANAFLEX) 4 MG tablet Take 4 mg by mouth every 6 (six)  hours as needed for muscle spasms.   0   No current facility-administered medications for this visit.     REVIEW OF SYSTEMS:  _0  denotes positive finding, _1  denotes negative finding Cardiac  Comments:  Chest pain or chest pressure:    Shortness of breath upon exertion:    Short of breath when lying flat:    Irregular heart rhythm:        Vascular    Pain in calf, thigh, or hip brought on by ambulation:    Pain in feet at night that wakes you up from your sleep:     Blood clot in your veins:    Leg swelling:         Pulmonary    Oxygen at home:    Productive cough:     Wheezing:         Neurologic    Sudden weakness in arms or legs:     Sudden numbness in arms or legs:     Sudden onset of difficulty speaking or slurred speech:    Temporary loss of vision in one eye:     Problems with dizziness:         Gastrointestinal    Blood in stool:     Vomited blood:         Genitourinary    Burning when urinating:     Blood in urine:        Psychiatric    Major depression:         Hematologic    Bleeding problems:    Problems with blood clotting too easily:        Skin    Rashes or ulcers:        Constitutional    Fever or chills:      PHYSICAL EXAM: Vitals:   05/25/16 1327  BP: 114/70  Pulse: 80  Resp: 20  Temp: 97.8 F (36.6 C)  TempSrc: Oral  SpO2: 95%  Weight: 230 lb (104.3 kg)  Height: _2  (1.651 m)    GENERAL: The patient is a well-nourished female, in no acute distress. The vital signs are documented above. CARDIOVASCULAR: Carotid arteries without bruits bilaterally. 2+ radial 2+ popliteal and 2+ dorsalis pedis pulses bilaterally PULMONARY: There is good air exchange  ABDOMEN: Soft and non-tender . Obese. No masses  noted. No bruit present MUSCULOSKELETAL: There are no major deformities or cyanosis. NEUROLOGIC: No focal weakness or paresthesias are detected. SKIN: There are no ulcers or rashes noted. PSYCHIATRIC: The patient has a normal affect.  DATA:  I reviewed her CT images discussed this at length with the patient. This does show subtotal occlusion at the origin of her spare mesenteric artery. She has a widely patent celiac and inferior mesenteric artery by CT scan  MEDICAL ISSUES: I discussed the significance of this with patient. She has no symptoms referable to this. Explain the significance of cross fill from one mesenteric vascular bed to another. Explained that she is develop collateral channels and is not having any difficulty and would be extremely unlikely that she would develop difficulty related to her SMA occlusive disease. I did explain symptoms of progressive ischemia and she will notify us. This should this occur. Otherwise we'll see her again on an as-needed basis   Rosetta Posner, MD [facility] Tel [phone] Pager [phone]

## 2016-06-03 ENCOUNTER — Other Ambulatory Visit: Payer: Self-pay | Admitting: Internal Medicine

## 2016-06-03 NOTE — Telephone Encounter (Signed)
Please call Alpraz  

## 2016-06-10 ENCOUNTER — Other Ambulatory Visit: Payer: Self-pay | Admitting: Internal Medicine

## 2016-06-15 DIAGNOSIS — H01001 Unspecified blepharitis right upper eyelid: Secondary | ICD-10-CM | POA: Diagnosis not present

## 2016-06-15 DIAGNOSIS — H01004 Unspecified blepharitis left upper eyelid: Secondary | ICD-10-CM | POA: Diagnosis not present

## 2016-06-15 DIAGNOSIS — H40053 Ocular hypertension, bilateral: Secondary | ICD-10-CM | POA: Diagnosis not present

## 2016-06-15 DIAGNOSIS — H01002 Unspecified blepharitis right lower eyelid: Secondary | ICD-10-CM | POA: Diagnosis not present

## 2016-06-17 ENCOUNTER — Other Ambulatory Visit: Payer: Self-pay | Admitting: Internal Medicine

## 2016-06-24 ENCOUNTER — Encounter: Payer: Self-pay | Admitting: Internal Medicine

## 2016-06-24 ENCOUNTER — Ambulatory Visit (INDEPENDENT_AMBULATORY_CARE_PROVIDER_SITE_OTHER): Payer: PPO | Admitting: Internal Medicine

## 2016-06-24 VITALS — BP 116/64 | HR 90 | Temp 98.0°F | Resp 18 | Ht 61.5 in | Wt 234.0 lb

## 2016-06-24 DIAGNOSIS — E782 Mixed hyperlipidemia: Secondary | ICD-10-CM

## 2016-06-24 DIAGNOSIS — R632 Polyphagia: Secondary | ICD-10-CM | POA: Insufficient documentation

## 2016-06-24 DIAGNOSIS — N183 Chronic kidney disease, stage 3 (moderate): Secondary | ICD-10-CM | POA: Diagnosis not present

## 2016-06-24 DIAGNOSIS — I2510 Atherosclerotic heart disease of native coronary artery without angina pectoris: Secondary | ICD-10-CM

## 2016-06-24 DIAGNOSIS — E119 Type 2 diabetes mellitus without complications: Secondary | ICD-10-CM | POA: Diagnosis not present

## 2016-06-24 DIAGNOSIS — E1142 Type 2 diabetes mellitus with diabetic polyneuropathy: Secondary | ICD-10-CM | POA: Diagnosis not present

## 2016-06-24 DIAGNOSIS — Z79899 Other long term (current) drug therapy: Secondary | ICD-10-CM | POA: Diagnosis not present

## 2016-06-24 DIAGNOSIS — Z9119 Patient's noncompliance with other medical treatment and regimen: Secondary | ICD-10-CM | POA: Diagnosis not present

## 2016-06-24 DIAGNOSIS — Z794 Long term (current) use of insulin: Secondary | ICD-10-CM | POA: Diagnosis not present

## 2016-06-24 DIAGNOSIS — E559 Vitamin D deficiency, unspecified: Secondary | ICD-10-CM | POA: Diagnosis not present

## 2016-06-24 DIAGNOSIS — E1165 Type 2 diabetes mellitus with hyperglycemia: Secondary | ICD-10-CM

## 2016-06-24 DIAGNOSIS — E1122 Type 2 diabetes mellitus with diabetic chronic kidney disease: Secondary | ICD-10-CM | POA: Diagnosis not present

## 2016-06-24 DIAGNOSIS — Z91199 Patient's noncompliance with other medical treatment and regimen due to unspecified reason: Secondary | ICD-10-CM

## 2016-06-24 DIAGNOSIS — IMO0002 Reserved for concepts with insufficient information to code with codable children: Secondary | ICD-10-CM

## 2016-06-24 DIAGNOSIS — I10 Essential (primary) hypertension: Secondary | ICD-10-CM | POA: Diagnosis not present

## 2016-06-24 LAB — BASIC METABOLIC PANEL WITH GFR
BUN: 38 mg/dL — ABNORMAL HIGH (ref 7–25)
CALCIUM: 8.8 mg/dL (ref 8.6–10.4)
CO2: 26 mmol/L (ref 20–31)
CREATININE: 1.56 mg/dL — AB (ref 0.60–0.93)
Chloride: 97 mmol/L — ABNORMAL LOW (ref 98–110)
GFR, EST AFRICAN AMERICAN: 37 mL/min — AB (ref >=60)
GFR, EST NON AFRICAN AMERICAN: 32 mL/min — AB (ref >=60)
GLUCOSE: 238 mg/dL — AB (ref 65–99)
Potassium: 4.2 mmol/L (ref 3.5–5.3)
Sodium: 138 mmol/L (ref 135–146)

## 2016-06-24 LAB — HEPATIC FUNCTION PANEL
ALT: 9 U/L (ref 6–29)
AST: 10 U/L (ref 10–35)
Albumin: 3.8 g/dL (ref 3.6–5.1)
Alkaline Phosphatase: 98 U/L (ref 33–130)
BILIRUBIN INDIRECT: 0.4 mg/dL (ref 0.2–1.2)
Bilirubin, Direct: 0.1 mg/dL (ref <=0.2)
TOTAL PROTEIN: 6.5 g/dL (ref 6.1–8.1)
Total Bilirubin: 0.5 mg/dL (ref 0.2–1.2)

## 2016-06-24 LAB — CBC WITH DIFFERENTIAL/PLATELET
Basophils Absolute: 0 cells/uL (ref 0–200)
Basophils Relative: 0 %
Eosinophils Absolute: 198 cells/uL (ref 15–500)
Eosinophils Relative: 3 %
HEMATOCRIT: 32.6 % — AB (ref 35.0–45.0)
Hemoglobin: 10.2 g/dL — ABNORMAL LOW (ref 11.7–15.5)
LYMPHS PCT: 21 %
Lymphs Abs: 1386 cells/uL (ref 850–3900)
MCH: 26.2 pg — AB (ref 27.0–33.0)
MCHC: 31.3 g/dL — AB (ref 32.0–36.0)
MCV: 83.8 fL (ref 80.0–100.0)
MONO ABS: 330 {cells}/uL (ref 200–950)
MPV: 9.5 fL (ref 7.5–12.5)
Monocytes Relative: 5 %
NEUTROS PCT: 71 %
Neutro Abs: 4686 cells/uL (ref 1500–7800)
Platelets: 261 10*3/uL (ref 140–400)
RBC: 3.89 MIL/uL (ref 3.80–5.10)
RDW: 15.2 % — ABNORMAL HIGH (ref 11.0–15.0)
WBC: 6.6 10*3/uL (ref 3.8–10.8)

## 2016-06-24 LAB — MAGNESIUM: MAGNESIUM: 1.9 mg/dL (ref 1.5–2.5)

## 2016-06-24 LAB — LIPID PANEL
CHOLESTEROL: 141 mg/dL (ref <200)
HDL: 31 mg/dL — ABNORMAL LOW (ref >50)
LDL Cholesterol: 46 mg/dL (ref <100)
TRIGLYCERIDES: 320 mg/dL — AB (ref <150)
Total CHOL/HDL Ratio: 4.5 Ratio (ref <5.0)
VLDL: 64 mg/dL — ABNORMAL HIGH (ref <30)

## 2016-06-24 LAB — TSH: TSH: 1.3 mIU/L

## 2016-06-24 NOTE — Progress Notes (Signed)
[facility] [name], M.D.        Janet Choi. Janet Choi, P.A.-C       [name], P.A.-C  [facility]                [address] [place] [id number] Telephone [phone] Telefax [phone] ______________________________________________________________________     This very nice but extremely poorly compliant 76 y.o. DWF presents for quarterly follow up with Hypertension,  Recent CAD/ Stenting,  Hyperlipidemia, Insulin requiring T2_DM and Vitamin D Deficiency.      Patient is treated for HTN (1997) & BP has been controlled at home. Today's BP is at goal - 116/64.  In Dec she had a NSTEMI and had PCA with DES x 2 and was added Brilinta w/ASA. Patient has had no complaints of any cardiac type chest pain, palpitations, dyspnea/orthopnea/PND, dizziness, claudication, or dependent edema.     Hyperlipidemia is not controlled despite meds with her non compliant diet. Patient denies myalgias or other med SE's. Last Lipids were not at goal: Lab Results  Component Value Date   CHOL 251 (H) 04/17/2016   HDL 38 (L) 04/17/2016   LDLCALC 145 (H) 04/17/2016   TRIG 341 (H) 04/17/2016   CHOLHDL 6.6 04/17/2016      Also, the patient has history of severe Morbid Obesity (BMI 43+) and consequent T2_DM since 1995 and chronically poorly controlled consequent of her gluttonous recalcitrant overeating who was started on Insulin mix in June 2017 and is now managed by Dr Cruzita Lederer. She reports CBG's usu range about 250 mg%.  Patient also has peripheral neuropathy and CKD3 (GFR 53 ml/min). Last A1c on Dec 6 was 10.3% and surprisingly has had no symptoms of reactive hypoglycemia, diabetic polys or visual blurring. Patient endorses hot & cold paresthesias to the knees.      Further, the patient also has history of Vitamin D Deficiency ("25" in 2008) and supplements vitamin D without any suspected side-effects. Last  vitamin D was still extremely low and not at goal:  Lab Results  Component Value Date   VD25OH 33 10/21/2015   Current Outpatient Prescriptions on File Prior to Visit  Medication Sig  . acetaminophen  325 MG tablet Take 2 tab every 4 hrs as needed for headache or mild pain.  Marland Kitchen ALPRAZolam  0.25 MG tablet TAKE 1 TAB 3 x/ A DAY AS NEEDED FOR ANXIETY  . aspirin EC 81 MG EC tablet Take 1 tablet (81 mg total) by mouth daily.  Marland Kitchen atorvastatin  80 MG tablet Take 1 tablet (80 mg total) by mouth daily at 6 PM.  . clopidogrel  75 MG tablet Take 1 tablet (75 mg total) by mouth daily.  . diclofenac  1.3 % PATCH Place 1 patch onto the skin daily as needed    . gabapentin  600 MG tablet Take 300 mg by mouth at bedtime.  Marland Kitchen glucose blood test strip Use as instructed  . NORCO 5-325 MG tablet Take 0.5-1 tab every 6  hrs as needed  . losartan  50 MG tablet TAKE 1 TAB DAILY.  Marland Kitchen NITROSTAT 0.4 MG SL tablet as needed for chest pain.  Marland Kitchen NOVOLIN 70/30 INJECT 50 UNITS TWICE DAILY  . Ondansetron 4 MG tablet Take 1 tab every 6  hours.  Marland Kitchen rOPINIRole (REQUIP) 3 MG  TAKE 1 TAB  4  TIMES  DAILY.   Allergies  Allergen Reactions  . Brilinta [Ticagrelor] Shortness Of Breath and Other (See Comments)    Reaction:  Chest pain   . Lyrica [Pregabalin] Other (See Comments)    Reaction: Blurry vision   . Aspirin Other (See Comments)    Reaction:  Unknown  Pt is able to take in small doses.   . Crestor [Rosuvastatin] Other (See Comments)    Reaction:  Leg cramps   . Lipitor [Atorvastatin] Other (See Comments)    Reaction:  Leg cramps  Pt is currently on this medication.    . Minocycline Hcl Other (See Comments)    Reaction:  Unknown   . Oruvail [Ketoprofen] Other (See Comments)    Reaction:  Unknown   . Tricor [Fenofibrate] Other (See Comments)    Reaction:  Unknown   . Vasotec [Enalaprilat] Other (See Comments)    Reaction:  Unknown   . Victoza [Liraglutide] Other (See Comments)    Reaction:  Unknown   . Zinc Other  (See Comments)    Reaction:  Unknown    PMHx:   Past Medical History:  Diagnosis Date  . Anemia    hx (04/14/2016)  . Anxiety   . Arthritis    "severe in my back; hands; ankles" (04/14/2016)  . Basal cell carcinoma    "several burned off; one cut off"  . CAD in native artery    a. NSTEMI 04/2016 - s/p DES toLAD and LCx. PCI to LCx notable for microembolization during cath.  . Chest pain- reslved with stopping Brilinta now on Plaix 04/16/2016  . Chronic lower back pain   . CKD (chronic kidney disease), stage III   . Depression   . DJD (degenerative joint disease)   . Family history of adverse reaction to anesthesia    "half-sister used to get real sick" (04/14/2016)  . GERD (gastroesophageal reflux disease)   . History of gout   . Hyperlipidemia   . Hypertension   . IBS (irritable bowel syndrome)   . Ischemic cardiomyopathy    a. 04/2016: EF 40-50% by cath, 50-55% +WMA by echo.  . 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 ([place]) ~ 2015  . Morbid obesity ([place])   . NSTEMI (non-ST elevated myocardial infarction) ([place]) 04/14/2016  . OSA on CPAP   . Peripheral neuropathy ([place])   . Peripheral vascular disease ([place])   . RLS (restless legs syndrome)   . Spinal stenosis   . Type II diabetes mellitus ([place])   . Vitamin D deficiency    Immunization History  Administered Date(s) Administered  . DT 04/14/2015  . Hepatitis B 07/09/1999, 08/09/1999, 01/09/2000  . Influenza, High Dose Seasonal PF 03/20/2014  . Influenza,inj,quad, With Preservative 04/26/2016  . Influenza-Unspecified 03/28/2015, 04/14/2015  . Pneumococcal Conjugate-13 03/20/2014  . Pneumococcal Polysaccharide-23 10/15/2009  . Td 03/19/2004   Past Surgical History:  Procedure Laterality Date  . APPENDECTOMY    . BACK SURGERY    . BASAL CELL CARCINOMA EXCISION Left    leg  . CARDIAC CATHETERIZATION N/A 04/15/2016   Procedure: Left Heart Cath and Coronary Angiography;  Surgeon: Belva Crome, MD;  Location: [facility]  [facility];  Service: Cardiovascular;  Laterality: N/A;  . CARDIAC CATHETERIZATION N/A 04/15/2016   Procedure: Coronary Stent Intervention;  Surgeon: Belva Crome, MD;  Location: [facility];  Service: Cardiovascular;  Laterality: N/A;  Mid LAD Mid CFX  . CATARACT EXTRACTION, BILATERAL Bilateral   . JOINT REPLACEMENT    .  LUMBAR DISC SURGERY  X 2  . MELANOMA EXCISION Left    "towards the back of my neck"  . SHOULDER ARTHROSCOPY W/ ROTATOR CUFF REPAIR Left   . SHOULDER OPEN ROTATOR CUFF REPAIR Right   . TOTAL KNEE ARTHROPLASTY Bilateral    FHx:    Reviewed / unchanged  SHx:    Reviewed / unchanged  Systems Review:  Constitutional: Denies fever, chills, wt changes, headaches, insomnia, fatigue, night sweats, change in appetite. Eyes: Denies redness, blurred vision, diplopia, discharge, itchy, watery eyes.  ENT: Denies discharge, congestion, post nasal drip, epistaxis, sore throat, earache, hearing loss, dental pain, tinnitus, vertigo, sinus pain, snoring.  CV: Denies chest pain, palpitations, irregular heartbeat, syncope, dyspnea, diaphoresis, orthopnea, PND, claudication or edema. Respiratory: denies cough, dyspnea, DOE, pleurisy, hoarseness, laryngitis, wheezing.  Gastrointestinal: Denies dysphagia, odynophagia, heartburn, reflux, water brash, abdominal pain or cramps, nausea, vomiting, bloating, diarrhea, constipation, hematemesis, melena, hematochezia  or hemorrhoids. Genitourinary: Denies dysuria, frequency, urgency, nocturia, hesitancy, discharge, hematuria or flank pain. Musculoskeletal: Denies arthralgias, myalgias, stiffness, jt. swelling, pain, limping or strain/sprain.  Skin: Denies pruritus, rash, hives, warts, acne, eczema or change in skin lesion(s). Neuro: No weakness, tremor, incoordination, spasms, paresthesia or pain. Psychiatric: Denies confusion, memory loss or sensory loss. Endo: Denies change in weight, skin or hair change.  Heme/Lymph: No excessive bleeding,  bruising or enlarged lymph nodes.  Physical Exam  BP 116/64   Pulse 90   Temp 98 F (36.7 C) (Temporal)   Resp 18   Ht 5' 1.5" (1.562 m)   Wt 234 lb (106.1 kg)   BMI 43.50 kg/m   Appears well nourished and in no distress.  Eyes: PERRLA, EOMs, conjunctiva no swelling or erythema. Sinuses: No frontal/maxillary tenderness ENT/Mouth: EAC's clear, TM's nl w/o erythema, bulging. Nares clear w/o erythema, swelling, exudates. Oropharynx clear without erythema or exudates. Oral hygiene is good. Tongue normal, non obstructing. Hearing intact.  Neck: Supple. Thyroid nl. Car 2+/2+ without bruits, nodes or JVD. Chest: Respirations nl with BS clear & equal w/o rales, rhonchi, wheezing or stridor.  Cor: Heart sounds normal w/ regular rate and rhythm without sig. murmurs, gallops, clicks, or rubs. Peripheral pulses normal and equal  without edema.  Abdomen: Soft, redundant panniculus & bowel sounds normal. Non-tender w/o guarding, rebound, hernias, masses, or organomegaly.  Lymphatics: Unremarkable.  Musculoskeletal: Full ROM all peripheral extremities, joint stability, 5/5 strength, and normal gait.  Skin: Warm, dry without exposed rashes, lesions or ecchymosis apparent.  Neuro: Cranial nerves intact, reflexes equal bilaterally. Sensory-motor testing grossly intact. Tendon reflexes grossly intact.  Pysch: Alert & oriented x 3.  Insight and judgement nl & appropriate. No ideations.  Assessment and Plan:  1. Essential hypertension  - Continue medication, monitor blood pressure at home.  - Recommended DASH diet. Reminder to go to the ER if any CP,  SOB, nausea, dizziness, severe HA, changes vision/speech,  left arm numbness and tingling and jaw pain.  - CBC with Differential/Platelet - BASIC METABOLIC PANEL WITH GFR - TSH  2. Hyperlipidemia  - Recommended better diet, exercise & lifestyle modifications.  - Continue monitor periodic cholesterol/liver & renal functions  - Hepatic function  panel - Lipid panel - TSH  3. Insulin-requiring or dependent type II diabetes mellitus ([place])  - Recommended better diet, exercise, weight loss & lifestyle modifications.  - Monitor appropriate labs.  4. Uncontrolled type 2 diabetes mellitus with stage 3 chronic kidney disease, with long-term current use of insulin ([place])  5. Vitamin D deficiency  -  VITAMIN D 25 Hydroxy  - Continue supplementation.  6. CAD in native artery  - Lipid panel  7. Compliance poor   8. Poorly controlled type 2 diabetes mellitus with peripheral neuropathy ([place])   9. Medication management  - CBC with Differential/Platelet - BASIC METABOLIC PANEL WITH GFR - Hepatic function panel - Magnesium - Lipid panel - TSH - VITAMIN D 25 Hydroxy        Recommended regular exercise, BP monitoring, weight control, and discussed med and SE's. Recommended labs to assess and monitor clinical status. Further disposition pending results of labs. Over 30 minutes of exam, counseling, chart review was performed

## 2016-06-24 NOTE — Patient Instructions (Signed)

## 2016-06-25 LAB — VITAMIN D 25 HYDROXY (VIT D DEFICIENCY, FRACTURES): VIT D 25 HYDROXY: 40 ng/mL (ref 30–100)

## 2016-06-28 ENCOUNTER — Ambulatory Visit (INDEPENDENT_AMBULATORY_CARE_PROVIDER_SITE_OTHER): Payer: PPO | Admitting: Internal Medicine

## 2016-06-28 ENCOUNTER — Other Ambulatory Visit: Payer: Self-pay

## 2016-06-28 ENCOUNTER — Encounter: Payer: Self-pay | Admitting: Internal Medicine

## 2016-06-28 ENCOUNTER — Other Ambulatory Visit: Payer: Self-pay | Admitting: Internal Medicine

## 2016-06-28 VITALS — BP 124/82 | HR 88 | Ht 61.5 in | Wt 236.0 lb

## 2016-06-28 DIAGNOSIS — E1142 Type 2 diabetes mellitus with diabetic polyneuropathy: Secondary | ICD-10-CM

## 2016-06-28 DIAGNOSIS — E1165 Type 2 diabetes mellitus with hyperglycemia: Secondary | ICD-10-CM

## 2016-06-28 MED ORDER — DULAGLUTIDE 0.75 MG/0.5ML ~~LOC~~ SOAJ: SUBCUTANEOUS | 1 refills | Status: DC

## 2016-06-28 MED ORDER — INSULIN PEN NEEDLE 32G X 4 MM MISC: 11 refills | Status: DC

## 2016-06-28 MED ORDER — GLUCOSE BLOOD VI STRP: ORAL_STRIP | 5 refills | Status: DC

## 2016-06-28 MED ORDER — INSULIN GLARGINE 100 UNIT/ML SOLOSTAR PEN
70.0000 [IU] | PEN_INJECTOR | Freq: Every day | SUBCUTANEOUS | 11 refills | Status: DC
Start: 2016-06-28 — End: 2016-09-06

## 2016-06-28 MED ORDER — BUPROPION HCL ER (XL) 300 MG PO TB24: ORAL_TABLET | ORAL | 1 refills | Status: DC

## 2016-06-28 MED ORDER — INSULIN ASPART 100 UNIT/ML FLEXPEN: 16.0000 [IU] | PEN_INJECTOR | Freq: Three times a day (TID) | SUBCUTANEOUS | 11 refills | Status: DC

## 2016-06-28 NOTE — Patient Instructions (Addendum)
Please stop 70/30 and start: - Lantus 70 units at bedtime - Novolog:  - 16 units before a smaller meal  - 20 units before a larger meal  Please start Trulicity [id number] mg weekly. Let me know when you are close to running out to call in the higher dose to your pharmacy (1.5 mg).  Please return in 1.5 months with your sugar log.

## 2016-06-28 NOTE — Progress Notes (Signed)
Patient ID: Janet Choi, female   DOB: 11/24/1940, 76 y.o.   MRN: [id number]\  HPI: Janet Choi is a 76 y.o.-year-old female, returning for f/u for DM2, dx in ~2009, insulin-dependent since 10/2015, uncontrolled, with complications (CAD - s/p NSTEMI, CKD, PN). She is here with her sister who offers part of the hx. Last visit 2 mo ago. She has HTA and [employer].  Last hemoglobin A1c was: Lab Results  Component Value Date   HGBA1C 10.3 (H) 04/14/2016   HGBA1C 9.7 03/16/2016   HGBA1C 11.1 (H) 12/19/2015   Pt is on a regimen of 70/30 insulin * 40 units before b'fast * 55 units before dinner tonight and if no lows >> 60 units before dinner Previously: Metformin and Glimepiride. She was on Victoza.  Pt checks her sugars 1-2x a day and they are: - am: 109-263, 325 >> 109-170, 205 >> 116-287 - 2h after b'fast: n/c >> 280,  327 - before lunch: n/c  - 2h after lunch: 237 >> n/c >> 207-541 - before dinner: 181 >> n/c >> 242-396 - 2h after dinner: n/c >>  - bedtime: n/c >> 199-454, 546 - nighttime: n/c No lows. Lowest sugar was109 >> 116; ? hypoglycemia awareness. Highest 546.  Glucometer: One Touch Ultra  Pt's meals are: - Breakfast: bagel or 2 pieces toast; coffee + sugar + powdered creamer - Lunch: 1/2 PB sandwich or goes out - shares a plate with her sister - Dinner: mac and cheese + veggies; chicken + veggies + some starch - Snacks: after dinner (dinner is at 6 pm, she goes to bed at 1 -2 am) - popcorn, peanuts, sunflower seeds, candy  - + CKD, last BUN/creatinine:  Lab Results  Component Value Date   BUN 38 (H) 06/24/2016   BUN 19 04/28/2016   CREATININE 1.56 (H) 06/24/2016   CREATININE 1.14 (H) 04/28/2016  On Losartan.  - last set of lipids: Lab Results  Component Value Date   CHOL 141 06/24/2016   HDL 31 (L) 06/24/2016   LDLCALC 46 06/24/2016   TRIG 320 (H) 06/24/2016   CHOLHDL 4.5 06/24/2016   - last eye exam was on 02/2016. No DR. + Glaucoma. Dr. Satira Sark. - +  numbness and tingling in her feet. Has a podiatrist (Dr. Bjorn Loser).  Since last visit, she had an NSTEMI 04/14/2016 >> 2 stents. She was in ED for CP again 04/16/2016 and 04/28/2016. She also had a kidney stone.  She also has a history of HL, anxiety/depression. She stopped Xanax and started Wellbutrin.  ROS: Constitutional: + weight gain,  + fatigue, + subjective hypothermia, + poor sleep Eyes: no blurry vision, no xerophthalmia ENT: + sore throat, no nodules palpated in throat, no dysphagia/odynophagia, no hoarseness Cardiovascular: resolved CP/SOB/palpitations/+ leg swelling >> improved as she restarted Lasix Respiratory: no cough/no SOB Gastrointestinal: no N/V/D/C Musculoskeletal: + muscle/+ joint aches Skin: no rashes Neurological: no tremors/numbness/tingling/dizziness, + headache  I reviewed pt's medications, allergies, PMH, social hx, family hx, and changes were documented in the history of present illness. Otherwise, unchanged from my initial visit note.  Past Medical History:  Diagnosis Date  . Anemia    hx (04/14/2016)  . Anxiety   . Arthritis    "severe in my back; hands; ankles" (04/14/2016)  . Basal cell carcinoma    "several burned off; one cut off"  . CAD in native artery    a. NSTEMI 04/2016 - s/p DES toLAD and LCx. PCI to LCx notable for microembolization during  cath.  . Chest pain- reslved with stopping Brilinta now on Plaix 04/16/2016  . Chronic lower back pain   . CKD (chronic kidney disease), stage III   . Depression   . DJD (degenerative joint disease)   . Family history of adverse reaction to anesthesia    "half-sister used to get real sick" (04/14/2016)  . GERD (gastroesophageal reflux disease)   . History of gout   . Hyperlipidemia   . Hypertension   . IBS (irritable bowel syndrome)   . Ischemic cardiomyopathy    a. 04/2016: EF 40-50% by cath, 50-55% +WMA by echo.  . 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 ([place]) ~ 2015  . Morbid obesity ([place])   .  NSTEMI (non-ST elevated myocardial infarction) ([place]) 04/14/2016  . OSA on CPAP   . Peripheral neuropathy ([place])   . Peripheral vascular disease ([place])   . RLS (restless legs syndrome)   . Spinal stenosis   . Type II diabetes mellitus ([place])   . Vitamin D deficiency    Past Surgical History:  Procedure Laterality Date  . APPENDECTOMY    . BACK SURGERY    . BASAL CELL CARCINOMA EXCISION Left    leg  . CARDIAC CATHETERIZATION N/A 04/15/2016   Procedure: Left Heart Cath and Coronary Angiography;  Surgeon: Belva Crome, MD;  Location: [place] [facility];  Service: Cardiovascular;  Laterality: N/A;  . CARDIAC CATHETERIZATION N/A 04/15/2016   Procedure: Coronary Stent Intervention;  Surgeon: Belva Crome, MD;  Location: [place] [facility];  Service: Cardiovascular;  Laterality: N/A;  Mid LAD Mid CFX  . CATARACT EXTRACTION, BILATERAL Bilateral   . JOINT REPLACEMENT    . LUMBAR DISC SURGERY  X 2  . MELANOMA EXCISION Left    "towards the back of my neck"  . SHOULDER ARTHROSCOPY W/ ROTATOR CUFF REPAIR Left   . SHOULDER OPEN ROTATOR CUFF REPAIR Right   . TOTAL KNEE ARTHROPLASTY Bilateral    Social History   Social History  . Marital status: Single    Spouse name: N/A  . Number of children: 1   Occupational History  . Retired, works part-time, 16 hours a week    Social History Main Topics  . Smoking status: Former Smoker    Quit date: 05/11/1983  . Smokeless tobacco: Never Used  . Alcohol use No  . Drug use: No   Current Outpatient Prescriptions on File Prior to Visit  Medication Sig Dispense Refill  . acetaminophen (TYLENOL) 325 MG tablet Take 2 tablets (650 mg total) by mouth every 4 (four) hours as needed for headache or mild pain.    Marland Kitchen aspirin EC 81 MG EC tablet Take 1 tablet (81 mg total) by mouth daily.    Marland Kitchen atorvastatin (LIPITOR) 80 MG tablet Take 1 tablet (80 mg total) by mouth daily at 6 PM. 90 tablet 3  . Blood Glucose Monitoring Suppl (ONE TOUCH ULTRA 2) w/Device KIT Use as  directed 1 each 0  . clopidogrel (PLAVIX) 75 MG tablet Take 1 tablet (75 mg total) by mouth daily. 30 tablet 6  . diclofenac (FLECTOR) 1.3 % PTCH Place 1 patch onto the skin daily as needed (for pain).     Marland Kitchen gabapentin (NEURONTIN) 600 MG tablet Take 300 mg by mouth at bedtime.    Marland Kitchen glucose blood test strip Use as instructed 100 each 12  . HYDROcodone-acetaminophen (NORCO/VICODIN) 5-325 MG tablet Take 0.5-1 tablets by mouth every 6 (six) hours as needed for  moderate pain.   0  . Lancets (ONETOUCH ULTRASOFT) lancets Use as instructed 100 each 12  . losartan (COZAAR) 50 MG tablet TAKE 1 TABLET (50 MG TOTAL) BY MOUTH DAILY. 90 tablet 1  . nitroGLYCERIN (NITROSTAT) 0.4 MG SL tablet Place 1 tablet (0.4 mg total) under the tongue every 5 (five) minutes x 3 doses as needed for chest pain. 25 tablet 4  . NOVOLIN 70/30 (70-30) 100 UNIT/ML injection INJECT 50 UNITS TWICE DAILY 30 mL 3  . ondansetron (ZOFRAN) 4 MG tablet Take 1 tablet (4 mg total) by mouth every 6 (six) hours. 12 tablet 0  . rOPINIRole (REQUIP) 3 MG tablet TAKE 1 TABLET (3 MG TOTAL) BY MOUTH 4 (FOUR) TIMES DAILY. 90 tablet 1  . ALPRAZolam (XANAX) 0.25 MG tablet TAKE 1 TABLET THREE TIMES A DAY AS NEEDED FOR ANXIETY (Patient not taking: Reported on 06/28/2016) 30 tablet 0   No current facility-administered medications on file prior to visit.    Allergies  Allergen Reactions  . Brilinta [Ticagrelor] Shortness Of Breath and Other (See Comments)    Reaction:  Chest pain   . Lyrica [Pregabalin] Other (See Comments)    Reaction: Blurry vision   . Aspirin Other (See Comments)    Reaction:  Unknown  Pt is able to take in small doses.   . Crestor [Rosuvastatin] Other (See Comments)    Reaction:  Leg cramps   . Lipitor [Atorvastatin] Other (See Comments)    Reaction:  Leg cramps  Pt is currently on this medication.    . Minocycline Hcl Other (See Comments)    Reaction:  Unknown   . Oruvail [Ketoprofen] Other (See Comments)    Reaction:   Unknown   . Tricor [Fenofibrate] Other (See Comments)    Reaction:  Unknown   . Vasotec [Enalaprilat] Other (See Comments)    Reaction:  Unknown   . Victoza [Liraglutide] Other (See Comments)    Reaction:  Unknown   . Zinc Other (See Comments)    Reaction:  Unknown    Family History  Problem Relation Age of Onset  . Heart disease Mother   . Kidney disease Father   . AAA (abdominal aortic aneurysm) Father   . Heart disease Son   . Colon cancer Neg Hx   . Colon polyps Neg Hx   . Esophageal cancer Neg Hx   . Pancreatic cancer Neg Hx   . Stomach cancer Neg Hx   . Liver disease Neg Hx   . Diabetes Neg Hx     PE: BP 124/82 (BP Location: Left Arm, Patient Position: Sitting)   Pulse 88   Ht 5' 1.5" (1.562 m)   Wt 236 lb (107 kg)   SpO2 96%   BMI 43.87 kg/m  Wt Readings from Last 3 Encounters:  06/28/16 236 lb (107 kg)  06/24/16 234 lb (106.1 kg)  05/25/16 230 lb (104.3 kg)   Constitutional:Obese, in NAD Eyes: PERRLA, EOMI, no exophthalmos ENT: moist mucous membranes, no thyromegaly, no cervical lymphadenopathy Cardiovascular: RRR, No MRG, + Pitting edema bilaterally Respiratory: CTA B Gastrointestinal: abdomen soft, NT, ND, BS+ Musculoskeletal: no deformities, strength intact in all 4 Skin: moist, warm, no rashes Neurological: no tremor with outstretched hands, DTR normal in all 4  ASSESSMENT: 1. DM2, insulin-dependent, uncontrolled, with complications - CAD - s/p NSTEMI - PN - CKD  PLAN:  1. Patient with long-standing, uncontrolled diabetes, on  premixed insulin.  - HbA1c was 10.3% 2 mo ago >> sugars were  improving at last visit >> we continued the premixed insulins >> at this visit, sugars are much worse >> will stop the premixed insulin and switch to basal-bolus regimen. Will also add Trulicity as she mentions this helped her with hunger and weight in the past. - I suggested to:  Patient Instructions  Please stop 70/30 and start: - Lantus 70 units at bedtime -  Novolog:  - 16 units before a smaller meal  - 20 units before a larger meal  Please start Trulicity 7.90 mg weekly. Let me know when you are close to running out to call in the higher dose to your pharmacy (1.5 mg).  Please return in 1.5 months with your sugar log.   - continue checking sugars at different times of the day - check 3-4 times a day, rotating checks - advised for yearly eye exams >> She is up-to-date - Kidney test at last check >> higher >> likely dehydration 2/2 HGly  - Return to clinic in 1.5 mo with sugar log   Philemon Kingdom, MD PhD [facility]

## 2016-07-05 ENCOUNTER — Encounter (HOSPITAL_COMMUNITY): Payer: Self-pay | Admitting: Internal Medicine

## 2016-07-05 NOTE — Progress Notes (Signed)
Mailed letter with [facility] to pt ... KJ  °

## 2016-07-07 ENCOUNTER — Ambulatory Visit (INDEPENDENT_AMBULATORY_CARE_PROVIDER_SITE_OTHER): Payer: PPO | Admitting: Internal Medicine

## 2016-07-07 ENCOUNTER — Encounter: Payer: Self-pay | Admitting: Internal Medicine

## 2016-07-07 VITALS — BP 144/68 | HR 82 | Temp 98.2°F | Resp 18 | Ht 61.5 in

## 2016-07-07 DIAGNOSIS — J0410 Acute tracheitis without obstruction: Secondary | ICD-10-CM | POA: Diagnosis not present

## 2016-07-07 DIAGNOSIS — J0100 Acute maxillary sinusitis, unspecified: Secondary | ICD-10-CM | POA: Diagnosis not present

## 2016-07-07 MED ORDER — PROMETHAZINE-CODEINE 6.25-10 MG/5ML PO SYRP: ORAL_SOLUTION | ORAL | 0 refills | Status: AC

## 2016-07-07 MED ORDER — BENZONATATE 200 MG PO CAPS: ORAL_CAPSULE | ORAL | 1 refills | Status: DC

## 2016-07-07 MED ORDER — PREDNISONE 10 MG PO TABS: ORAL_TABLET | ORAL | 0 refills | Status: AC

## 2016-07-07 MED ORDER — AZITHROMYCIN 250 MG PO TABS: ORAL_TABLET | ORAL | 1 refills | Status: DC

## 2016-07-07 NOTE — Progress Notes (Signed)
Subjective:    Patient ID: Janet Choi, female    DOB: 1941-01-06, 76 y.o.   MRN: [id number]  HPI  This very nice, but extremely poorly compliant 76 yo WWF with Ins req T2_DM presents with c/o 5 day hx/o of worsening head/chest congestion with cough minimally productive of a green sputum and also having intermittent wheezing. Has had some low grade fever, ? Chills, no sweats or rashes.   Medication Sig  . acetaminophen (TYLENOL) 325 MG tablet Take 2 tablets (650 mg total) by mouth every 4 (four) hours as needed for headache or mild pain.  Marland Kitchen aspirin EC 81 MG EC tablet Take 1 tablet (81 mg total) by mouth daily.  Marland Kitchen atorvastatin (LIPITOR) 80 MG tablet Take 1 tablet (80 mg total) by mouth daily at 6 PM.  . Blood Glucose Monitoring Suppl (ONE TOUCH ULTRA 2) w/Device KIT Use as directed  . buPROPion (WELLBUTRIN XL) 300 MG 24 hr tablet Take 1 tablet daily  . clopidogrel (PLAVIX) 75 MG tablet Take 1 tablet (75 mg total) by mouth daily.  . diclofenac (FLECTOR) 1.3 % PTCH Place 1 patch onto the skin daily as needed (for pain).   . Dulaglutide (TRULICITY) 3.41 DQ/2.2WL SOPN Please inject 0.75 mg under skin weekly in the am  . furosemide (LASIX) 40 MG tablet Take 40 mg by mouth 3 (three) times daily.  Marland Kitchen gabapentin (NEURONTIN) 600 MG tablet Take 300 mg by mouth at bedtime.  Marland Kitchen glucose blood (ONETOUCH VERIO) test strip Use as instructed to check sugar 2 times daily.  Marland Kitchen HYDROcodone-acetaminophen (NORCO/VICODIN) 5-325 MG tablet Take 0.5-1 tablets by mouth every 6 (six) hours as needed for moderate pain.   Marland Kitchen insulin aspart (NOVOLOG FLEXPEN) 100 UNIT/ML FlexPen Inject 16-20 Units into the skin 3 (three) times daily with meals.  . Insulin Glargine (LANTUS SOLOSTAR) 100 UNIT/ML Solostar Pen Inject 70 Units into the skin at bedtime.  . Insulin Pen Needle (CAREFINE PEN NEEDLES) 32G X 4 MM MISC Use 4x a day  . Lancets (ONETOUCH ULTRASOFT) lancets Use as instructed  . losartan (COZAAR) 50 MG tablet TAKE 1 TABLET (50  MG TOTAL) BY MOUTH DAILY.  . nitroGLYCERIN (NITROSTAT) 0.4 MG SL tablet Place 1 tablet (0.4 mg total) under the tongue every 5 (five) minutes x 3 doses as needed for chest pain.  Marland Kitchen ondansetron (ZOFRAN) 4 MG tablet Take 1 tablet (4 mg total) by mouth every 6 (six) hours.  Marland Kitchen rOPINIRole (REQUIP) 3 MG tablet TAKE 1 TABLET (3 MG TOTAL) BY MOUTH 4 (FOUR) TIMES DAILY.   Allergies  Allergen Reactions  . Brilinta [Ticagrelor] Shortness Of Breath and Other (See Comments)    Reaction:  Chest pain   . Lyrica [Pregabalin] Other (See Comments)    Reaction: Blurry vision   . Aspirin Other (See Comments)    Reaction:  Unknown  Pt is able to take in small doses.   . Crestor [Rosuvastatin] Other (See Comments)    Reaction:  Leg cramps   . Lipitor [Atorvastatin] Other (See Comments)    Reaction:  Leg cramps  Pt is currently on this medication.    . Minocycline Hcl Other (See Comments)    Reaction:  Unknown   . Oruvail [Ketoprofen] Other (See Comments)    Reaction:  Unknown   . Tricor [Fenofibrate] Other (See Comments)    Reaction:  Unknown   . Vasotec [Enalaprilat] Other (See Comments)    Reaction:  Unknown   . Victoza [Liraglutide] Other (See Comments)  Reaction:  Unknown   . Zinc Other (See Comments)    Reaction:  Unknown    Past Medical History:  Diagnosis Date  . Anemia    hx (04/14/2016)  . Anxiety   . Arthritis    "severe in my back; hands; ankles" (04/14/2016)  . Basal cell carcinoma    "several burned off; one cut off"  . CAD in native artery    a. NSTEMI 04/2016 - s/p DES toLAD and LCx. PCI to LCx notable for microembolization during cath.  . Chest pain- reslved with stopping Brilinta now on Plaix 04/16/2016  . Chronic lower back pain   . CKD (chronic kidney disease), stage III   . Depression   . DJD (degenerative joint disease)   . Family history of adverse reaction to anesthesia    "half-sister used to get real sick" (04/14/2016)  . GERD (gastroesophageal reflux disease)   .  History of gout   . Hyperlipidemia   . Hypertension   . IBS (irritable bowel syndrome)   . Ischemic cardiomyopathy    a. 04/2016: EF 40-50% by cath, 50-55% +WMA by echo.  . 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 ([place]) ~ 2015  . Morbid obesity ([place])   . NSTEMI (non-ST elevated myocardial infarction) ([place]) 04/14/2016  . OSA on CPAP   . Peripheral neuropathy ([place])   . Peripheral vascular disease ([place])   . RLS (restless legs syndrome)   . Spinal stenosis   . Type II diabetes mellitus ([place])   . Vitamin D deficiency    Past Surgical History:  Procedure Laterality Date  . APPENDECTOMY    . BACK SURGERY    . BASAL CELL CARCINOMA EXCISION Left    leg  . CARDIAC CATHETERIZATION N/A 04/15/2016   Procedure: Left Heart Cath and Coronary Angiography;  Surgeon: Belva Crome, MD;  Location: [place] [facility];  Service: Cardiovascular;  Laterality: N/A;  . CARDIAC CATHETERIZATION N/A 04/15/2016   Procedure: Coronary Stent Intervention;  Surgeon: Belva Crome, MD;  Location: [place] [facility];  Service: Cardiovascular;  Laterality: N/A;  Mid LAD Mid CFX  . CATARACT EXTRACTION, BILATERAL Bilateral   . JOINT REPLACEMENT    . LUMBAR DISC SURGERY  X 2  . MELANOMA EXCISION Left    "towards the back of my neck"  . SHOULDER ARTHROSCOPY W/ ROTATOR CUFF REPAIR Left   . SHOULDER OPEN ROTATOR CUFF REPAIR Right   . TOTAL KNEE ARTHROPLASTY Bilateral    Review of Systems  10 point systems review negative except as above.    Objective:   Physical Exam  BP (!) 144/68   Pulse 82   Temp 98.2 F (36.8 C) (Temporal)   Resp 18   Ht 5' 1.5" (1.562 m)   SpO2 94%   Persistent dry hacking cough and hoarse speech. No respiratory stridor.   HEENT - Eac's patent. TM's sl retracted and normal color. EOM's full. PERRLA. NasoOroPharynx clear. Neck - supple. Nl Thyroid. Carotids 2+ & No bruits, nodes, JVD Chest - Equal BS w/few scattered ins rales,  expir rhonchi and no wheezes. Cor - Nl HS. RRR w/o sig MGR.   No edema. MS- FROM w/o deformities. . Gait Nl. Neuro - No obvious Cr N abnormalities. Nl w/o focal abnormalities.    Assessment & Plan:   1. Tracheitis  - predniSONE (DELTASONE) 10 MG tablet; 1 tab 3 x day for 2 days, then 1 tab 2 x day for 2 days, then 1  tab 1 x day for 3 days  Dispense: 13 tablet; Refill: 0  - benzonatate (TESSALON) 200 MG capsule; Take 1 perle 3 x / day to prevent cough  Dispense: 30 capsule; Refill: 1  - azithromycin (ZITHROMAX) 250 MG tablet; Take 2 tablets (500 mg) on  Day 1,  followed by 1 tablet (250 mg) once daily on Days 2 through 5.  Dispense: 6 each; Refill: 1  - promethazine-codeine (PHENERGAN WITH CODEINE) 6.25-10 MG/5ML syrup; Take 1 teaspoon every 4 hours for cough  Dispense: 240 mL; Refill: 0  2. Acute maxillary sinusitis, recurrence not specified  - predniSONE (DELTASONE) 10 MG tablet; 1 tab 3 x day for 2 days, then 1 tab 2 x day for 2 days, then 1 tab 1 x day for 3 days  Dispense: 13 tablet; Refill: 0  - azithromycin (ZITHROMAX) 250 MG tablet; Take 2 tablets (500 mg) on  Day 1,  followed by 1 tablet (250 mg) once daily on Days 2 through 5.  Dispense: 6 each; Refill: 1  - discussed meds/SE's   - ROV - prn

## 2016-07-07 NOTE — Patient Instructions (Signed)

## 2016-07-25 ENCOUNTER — Other Ambulatory Visit: Payer: Self-pay | Admitting: Internal Medicine

## 2016-07-29 ENCOUNTER — Ambulatory Visit: Payer: PPO | Admitting: Cardiovascular Disease

## 2016-08-01 NOTE — Progress Notes (Deleted)
Cardiology Office Note   Date:  08/01/2016   ID:  Chaeli, Judy [birth year], MRN [id number]  PCP:  Alesia Richards, MD  Cardiologist:   Skeet Latch, MD   No chief complaint on file.     History of Present Illness: Janet Choi is a 76 y.o. female with CAD s/p NSTEMI with PCI of the LAD and LCx, hypertensive heart disease, hyperlipidemia, OSA, diabetes, morbid obesity and CKD III who presents for follow up.  Ms. Chilton presented to [facility] 04/2016 with NSTEMI.  She underwent LHC that revealed 95-99% mid LAD and LCx lesions that were successfully opened with Onyx Resolute DESs. Rash PCI of the left circumflex artery was complicated by microembolization. Left ventriculogram revealed LVEF 40-50%.  Subsequent echo revealed LVEF 50-55% with akinesis of the distal septal and apical myocardium. There is also akinesis of the apical inferior myocardium and grade 1 diastolic dysfunction.   She was readmitted with chest pain on the same day she was discharged from the hospital.  This was not felt to be recurrent ischemia but ticagrelor was switched to clopidogrel.  During that hospitalization Prozac was stopped due to interaction with clopidogrel. She followed up with [facility] on 04/26/16 and was feeling weak but otherwise well.  She was again seen in the ED with chest pain on 12/20.  Imdur was added to her regimen.   Past Medical History:  Diagnosis Date  . Anemia    hx (04/14/2016)  . Anxiety   . Arthritis    "severe in my back; hands; ankles" (04/14/2016)  . Basal cell carcinoma    "several burned off; one cut off"  . CAD in native artery    a. NSTEMI 04/2016 - s/p DES toLAD and LCx. PCI to LCx notable for microembolization during cath.  . Chest pain- reslved with stopping Brilinta now on Plaix 04/16/2016  . Chronic lower back pain   . CKD (chronic kidney disease), stage III   . Depression   . DJD (degenerative joint disease)   . Family history of adverse reaction  to anesthesia    "half-sister used to get real sick" (04/14/2016)  . GERD (gastroesophageal reflux disease)   . History of gout   . Hyperlipidemia   . Hypertension   . IBS (irritable bowel syndrome)   . Ischemic cardiomyopathy    a. 04/2016: EF 40-50% by cath, 50-55% +WMA by echo.  . 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 ([place]) ~ 2015  . Morbid obesity ([place])   . NSTEMI (non-ST elevated myocardial infarction) ([place]) 04/14/2016  . OSA on CPAP   . Peripheral neuropathy ([place])   . Peripheral vascular disease ([place])   . RLS (restless legs syndrome)   . Spinal stenosis   . Type II diabetes mellitus ([place])   . Vitamin D deficiency     Past Surgical History:  Procedure Laterality Date  . APPENDECTOMY    . BACK SURGERY    . BASAL CELL CARCINOMA EXCISION Left    leg  . CARDIAC CATHETERIZATION N/A 04/15/2016   Procedure: Left Heart Cath and Coronary Angiography;  Surgeon: Belva Crome, MD;  Location: [facility];  Service: Cardiovascular;  Laterality: N/A;  . CARDIAC CATHETERIZATION N/A 04/15/2016   Procedure: Coronary Stent Intervention;  Surgeon: Belva Crome, MD;  Location: [facility];  Service: Cardiovascular;  Laterality: N/A;  Mid LAD Mid CFX  . CATARACT EXTRACTION, BILATERAL Bilateral   . JOINT REPLACEMENT    .  LUMBAR DISC SURGERY  X 2  . MELANOMA EXCISION Left    "towards the back of my neck"  . SHOULDER ARTHROSCOPY W/ ROTATOR CUFF REPAIR Left   . SHOULDER OPEN ROTATOR CUFF REPAIR Right   . TOTAL KNEE ARTHROPLASTY Bilateral      Current Outpatient Prescriptions  Medication Sig Dispense Refill  . acetaminophen (TYLENOL) 325 MG tablet Take 2 tablets (650 mg total) by mouth every 4 (four) hours as needed for headache or mild pain.    Marland Kitchen ALPRAZolam (XANAX) 0.25 MG tablet TAKE 1 TABLET BY MOUTH THREE TIMES A DAY AS NEEDED ANXIETY  0  . aspirin EC 81 MG EC tablet Take 1 tablet (81 mg total) by mouth daily.    Marland Kitchen atorvastatin (LIPITOR) 80 MG tablet Take 1 tablet (80 mg  total) by mouth daily at 6 PM. 90 tablet 3  . azithromycin (ZITHROMAX) 250 MG tablet Take 2 tablets (500 mg) on  Day 1,  followed by 1 tablet (250 mg) once daily on Days 2 through 5. 6 each 1  . benzonatate (TESSALON) 200 MG capsule Take 1 perle 3 x / day to prevent cough 30 capsule 1  . Blood Glucose Monitoring Suppl (ONE TOUCH ULTRA 2) w/Device KIT Use as directed 1 each 0  . buPROPion (WELLBUTRIN XL) 300 MG 24 hr tablet Take 1 tablet daily 90 tablet 1  . clopidogrel (PLAVIX) 75 MG tablet Take 1 tablet (75 mg total) by mouth daily. 30 tablet 6  . diclofenac (FLECTOR) 1.3 % PTCH Place 1 patch onto the skin daily as needed (for pain).     . Dulaglutide (TRULICITY) 7.03 JK/0.9FG SOPN Please inject 0.75 mg under skin weekly in the am 4 pen 1  . furosemide (LASIX) 40 MG tablet Take 40 mg by mouth 3 (three) times daily.    Marland Kitchen gabapentin (NEURONTIN) 600 MG tablet Take 300 mg by mouth at bedtime.    Marland Kitchen glucose blood (ONETOUCH VERIO) test strip Use as instructed to check sugar 2 times daily. 200 each 5  . HYDROcodone-acetaminophen (NORCO/VICODIN) 5-325 MG tablet Take 0.5-1 tablets by mouth every 6 (six) hours as needed for moderate pain.   0  . insulin aspart (NOVOLOG FLEXPEN) 100 UNIT/ML FlexPen Inject 16-20 Units into the skin 3 (three) times daily with meals. 10 pen 11  . Insulin Glargine (LANTUS SOLOSTAR) 100 UNIT/ML Solostar Pen Inject 70 Units into the skin at bedtime. 10 pen 11  . Insulin Pen Needle (CAREFINE PEN NEEDLES) 32G X 4 MM MISC Use 4x a day 300 each 11  . Lancets (ONETOUCH ULTRASOFT) lancets Use as instructed 100 each 12  . losartan (COZAAR) 50 MG tablet TAKE 1 TABLET (50 MG TOTAL) BY MOUTH DAILY. 90 tablet 1  . nitroGLYCERIN (NITROSTAT) 0.4 MG SL tablet Place 1 tablet (0.4 mg total) under the tongue every 5 (five) minutes x 3 doses as needed for chest pain. 25 tablet 4  . ondansetron (ZOFRAN) 4 MG tablet Take 1 tablet (4 mg total) by mouth every 6 (six) hours. 12 tablet 0  . predniSONE  (DELTASONE) 10 MG tablet 1 tab 3 x day for 2 days, then 1 tab 2 x day for 2 days, then 1 tab 1 x day for 3 days 13 tablet 0  . promethazine-codeine (PHENERGAN WITH CODEINE) 6.25-10 MG/5ML syrup Take 1 teaspoon every 4 hours for cough 240 mL 0  . rOPINIRole (REQUIP) 3 MG tablet TAKE 1 TABLET (3 MG TOTAL) BY MOUTH 4 (FOUR) TIMES  DAILY. 90 tablet 1   No current facility-administered medications for this visit.     Allergies:   Brilinta [ticagrelor]; Lyrica [pregabalin]; Aspirin; Crestor [rosuvastatin]; Lipitor [atorvastatin]; Minocycline hcl; Oruvail [ketoprofen]; Tricor [fenofibrate]; Vasotec [enalaprilat]; Victoza [liraglutide]; and Zinc    Social History:  The patient  reports that she quit smoking about 33 years ago. Her smoking use included Cigarettes. She has a 62.50 pack-year smoking history. She has quit using smokeless tobacco. She reports that she does not drink alcohol or use drugs.   Family History:  The patient's ***family history includes AAA (abdominal aortic aneurysm) in her father; Heart disease in her mother and son; Kidney disease in her father.    ROS:  Please see the history of present illness.   Otherwise, review of systems are positive for {NONE DEFAULTED:18576::"none"}.   All other systems are reviewed and negative.    PHYSICAL EXAM: VS:  There were no vitals taken for this visit. , BMI There is no height or weight on file to calculate BMI. GENERAL:  Well appearing HEENT:  Pupils equal round and reactive, fundi not visualized, oral mucosa unremarkable NECK:  No jugular venous distention, waveform within normal limits, carotid upstroke brisk and symmetric, no bruits, no thyromegaly LYMPHATICS:  No cervical adenopathy LUNGS:  Clear to auscultation bilaterally HEART:  RRR.  PMI not displaced or sustained,S1 and S2 within normal limits, no S3, no S4, no clicks, no rubs, *** murmurs ABD:  Flat, positive bowel sounds normal in frequency in pitch, no bruits, no rebound, no  guarding, no midline pulsatile mass, no hepatomegaly, no splenomegaly EXT:  2 plus pulses throughout, no edema, no cyanosis no clubbing SKIN:  No rashes no nodules NEURO:  Cranial nerves II through XII grossly intact, motor grossly intact throughout PSYCH:  Cognitively intact, oriented to person place and time    EKG:  EKG {ACTION; IS/IS AEI:[id number]} ordered today. The ekg ordered today demonstrates ***  Echo 04/15/16: Study Conclusions  - Left ventricle: The cavity size was normal. There was moderate   focal basal and mild concentric hypertrophy. Systolic function   was normal. The estimated ejection fraction was in the range of   50% to 55%. There is akinesis of the distal septal and apical   myocardium. There is akinesis of the apicalinferior myocardium.   There was an increased relative contribution of atrial   contraction to ventricular filling. Doppler parameters are   consistent with abnormal left ventricular relaxation (grade 1   diastolic dysfunction). Doppler parameters are consistent with   high ventricular filling pressure. - Aortic valve: Mildly to moderately calcified annulus. Mildly   calcified leaflets. - Mitral valve: Calcified annulus. Mildly calcified leaflets .   There was trivial regurgitation. - Atrial septum: No defect or patent foramen ovale was identified.  LHC12/7/17: Non-ST elevation myocardial infarction with identification of 95-99% segmental mid LAD stenosis and focal eccentric 95-99% mid circumflex stenosis.  The first diagonal is small and contains segmental 80% stenosis. The coronaries are otherwise widely patent.  Successful drug-eluting stent using Onyx Resolute to reduce a 95% mid LAD stenosis to 0% with TIMI grade 3 flow. Final stent diameter 3.0 mm.  Successful drug-eluting stent using Onyx Resolute to reduce a 95% mid circumflex stenosis to 0% with TIMI grade 3 flow. Post stent diameter 3.5 mm. Intervention on this vessel was, complicated  by microembolization to the the most distal obtuse marginal beyond a bifurcation.  Mild hypokinesis of the anterior wall. EF 40-50%.  Recent Labs:  04/18/2016: B Natriuretic Peptide 463.5 06/24/2016: ALT 9; BUN 38; Creat 1.56; Hemoglobin 10.2; Magnesium 1.9; Platelets 261; Potassium 4.2; Sodium 138; TSH 1.30    Lipid Panel    Component Value Date/Time   CHOL 141 06/24/2016 1407   TRIG 320 (H) 06/24/2016 1407   HDL 31 (L) 06/24/2016 1407   CHOLHDL 4.5 06/24/2016 1407   VLDL 64 (H) 06/24/2016 1407   LDLCALC 46 06/24/2016 1407      Wt Readings from Last 3 Encounters:  06/28/16 107 kg (236 lb)  06/24/16 106.1 kg (234 lb)  05/25/16 104.3 kg (230 lb)      ASSESSMENT AND PLAN:  ***   Current medicines are reviewed at length with the patient today.  The patient {ACTIONS; HAS/DOES NOT HAVE:19233} concerns regarding medicines.  The following changes have been made:  {PLAN; NO CHANGE:13088:s}  Labs/ tests ordered today include: *** No orders of the defined types were placed in this encounter.    Disposition:   FU with ***    This note was written with the assistance of speech recognition software.  Please excuse any transcriptional errors.  Signed, Omar Gayden C. Oval Linsey, MD, [facility]  08/01/2016 2:42 AM    [facility]

## 2016-08-03 ENCOUNTER — Ambulatory Visit: Payer: PPO | Admitting: Cardiovascular Disease

## 2016-08-11 ENCOUNTER — Encounter: Payer: Self-pay | Admitting: Internal Medicine

## 2016-08-11 ENCOUNTER — Ambulatory Visit (INDEPENDENT_AMBULATORY_CARE_PROVIDER_SITE_OTHER): Payer: PPO | Admitting: Internal Medicine

## 2016-08-11 VITALS — BP 134/74 | HR 94 | Ht 61.5 in | Wt 229.0 lb

## 2016-08-11 DIAGNOSIS — N183 Chronic kidney disease, stage 3 (moderate): Secondary | ICD-10-CM

## 2016-08-11 DIAGNOSIS — E1122 Type 2 diabetes mellitus with diabetic chronic kidney disease: Secondary | ICD-10-CM | POA: Diagnosis not present

## 2016-08-11 DIAGNOSIS — E1165 Type 2 diabetes mellitus with hyperglycemia: Secondary | ICD-10-CM | POA: Diagnosis not present

## 2016-08-11 DIAGNOSIS — IMO0002 Reserved for concepts with insufficient information to code with codable children: Secondary | ICD-10-CM

## 2016-08-11 DIAGNOSIS — Z794 Long term (current) use of insulin: Secondary | ICD-10-CM | POA: Diagnosis not present

## 2016-08-11 LAB — POCT GLYCOSYLATED HEMOGLOBIN (HGB A1C): Hemoglobin A1C: 7.4

## 2016-08-11 NOTE — Patient Instructions (Addendum)
Please continue: - Lantus 70 units but move this after dinner - Novolog:  - 16 units before a smaller meal  - 20 units before a larger meal - Trulicity 1.5 mg weekly  Please return in 3 months with your sugar log.

## 2016-08-11 NOTE — Addendum Note (Signed)
Addended by: Caprice Beaver T on: 08/11/2016 11:17 AM   Modules accepted: Orders

## 2016-08-11 NOTE — Progress Notes (Signed)
Patient ID: Janet Choi, female   DOB: 23-Mar-1941, 76 y.o.   MRN: [id number]\  HPI: Janet Choi is a 76 y.o.-year-old female, returning for f/u for DM2, dx in ~2009, insulin-dependent since 10/2015, uncontrolled, with complications (CAD - s/p NSTEMI, CKD, PN). She is here with her sister who offers part of the hx. Last visit 1.5 mo ago. She has HTA and [employer].  She was on Prednisone for few days >> sugars higher, but overall MUCH improved since we changed her regimen at last visit. She also lost weight since last visit!  Last hemoglobin A1c was: Lab Results  Component Value Date   HGBA1C 10.3 (H) 04/14/2016   HGBA1C 9.7 03/16/2016   HGBA1C 11.1 (H) 12/19/2015   Pt was on a regimen of 70/30 insulin * 40 units before b'fast * 55 units before dinner tonight and if no lows >> 60 units before dinner Previously: Metformin and Glimepiride. She was on Victoza.  At last visit, we changed to: - Lantus 70 units at bedtime - Novolog:  - 16 units before a smaller meal  - 20 units before a larger meal - Trulicity 1.5 mg weekly >> She does complain of gas, flatulence. However, she definitely would like to continue with this.  Pt checks her sugars 1-2x a day and they were higher when on Prednisone: - am: 109-263, 325 >> 109-170, 205 >> 116-287 >> 92-192, 201 (high if forgets insulin at night or if she has a late snack) - 2h after b'fast: n/c >> 280,  327 >> n/c - before lunch: n/c >> 86, 105-173, 211 (Prednisone) - 2h after lunch: 237 >> n/c >> 207-541 >> 83-211 - before dinner: 181 >> n/c >> 242-396 >> 105-186, 219 - 2h after dinner: n/c >> 155-230 - bedtime: n/c >> 199-454, 546 >> 90, 107-219, 256 - nighttime: n/c No lows. Lowest sugar was109 >> 116 >> 83; ? hypoglycemia awareness. Highest 546 >> 310 x1.  Glucometer: One Touch Ultra  Pt's meals are: - Breakfast: bagel or 2 pieces toast; coffee + sugar + powdered creamer, sometimes eggs (once a week) - Lunch: 1/2 PB sandwich or goes out  - shares a plate with her sister - Dinner: mac and cheese + veggies; chicken + veggies + some starch - Snacks: after dinner (dinner is at 6 pm, she goes to bed at 1 -2 am) - popcorn, peanuts, sunflower seeds, candy, cookies  - + CKD, last BUN/creatinine:  Lab Results  Component Value Date   BUN 38 (H) 06/24/2016   BUN 19 04/28/2016   CREATININE 1.56 (H) 06/24/2016   CREATININE 1.14 (H) 04/28/2016  On Losartan.  - last set of lipids: Lab Results  Component Value Date   CHOL 141 06/24/2016   HDL 31 (L) 06/24/2016   LDLCALC 46 06/24/2016   TRIG 320 (H) 06/24/2016   CHOLHDL 4.5 06/24/2016   - last eye exam was on 02/2016. No DR. + Glaucoma. Dr. Satira Sark. - + numbness and tingling in her feet. Has a podiatrist (Dr. Bjorn Loser).  Since last visit, she had an NSTEMI 04/14/2016 >> 2 stents. She was in ED for CP again 04/16/2016 and 04/28/2016. She also had a kidney stone.  She also has a history of HL, anxiety/depression. She stopped Xanax and started Wellbutrin.  ROS: Constitutional: + weight loss, no fatigue, no subjective hypothermia Eyes: no blurry vision, no xerophthalmia ENT: no sore throat, no nodules palpated in throat, no dysphagia/odynophagia, no hoarseness Cardiovascular: resolved CP/SOB/palpitations/leg swelling  Respiratory:  no cough/no SOB Gastrointestinal: no N/V/D/C Musculoskeletal: + muscle/+ joint aches Skin: no rashes Neurological: no tremors/numbness/tingling/dizziness, no headache  I reviewed pt's medications, allergies, PMH, social hx, family hx, and changes were documented in the history of present illness. Otherwise, unchanged from my initial visit note.  Past Medical History:  Diagnosis Date  . Anemia    hx (04/14/2016)  . Anxiety   . Arthritis    "severe in my back; hands; ankles" (04/14/2016)  . Basal cell carcinoma    "several burned off; one cut off"  . CAD in native artery    a. NSTEMI 04/2016 - s/p DES toLAD and LCx. PCI to LCx notable for  microembolization during cath.  . Chest pain- reslved with stopping Brilinta now on Plaix 04/16/2016  . Chronic lower back pain   . CKD (chronic kidney disease), stage III   . Depression   . DJD (degenerative joint disease)   . Family history of adverse reaction to anesthesia    "half-sister used to get real sick" (04/14/2016)  . GERD (gastroesophageal reflux disease)   . History of gout   . Hyperlipidemia   . Hypertension   . IBS (irritable bowel syndrome)   . Ischemic cardiomyopathy    a. 04/2016: EF 40-50% by cath, 50-55% +WMA by echo.  . 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 ([place]) ~ 2015  . Morbid obesity ([place])   . NSTEMI (non-ST elevated myocardial infarction) ([place]) 04/14/2016  . OSA on CPAP   . Peripheral neuropathy ([place])   . Peripheral vascular disease ([place])   . RLS (restless legs syndrome)   . Spinal stenosis   . Type II diabetes mellitus ([place])   . Vitamin D deficiency    Past Surgical History:  Procedure Laterality Date  . APPENDECTOMY    . BACK SURGERY    . BASAL CELL CARCINOMA EXCISION Left    leg  . CARDIAC CATHETERIZATION N/A 04/15/2016   Procedure: Left Heart Cath and Coronary Angiography;  Surgeon: Belva Crome, MD;  Location: [facility];  Service: Cardiovascular;  Laterality: N/A;  . CARDIAC CATHETERIZATION N/A 04/15/2016   Procedure: Coronary Stent Intervention;  Surgeon: Belva Crome, MD;  Location: [facility];  Service: Cardiovascular;  Laterality: N/A;  Mid LAD Mid CFX  . CATARACT EXTRACTION, BILATERAL Bilateral   . JOINT REPLACEMENT    . LUMBAR DISC SURGERY  X 2  . MELANOMA EXCISION Left    "towards the back of my neck"  . SHOULDER ARTHROSCOPY W/ ROTATOR CUFF REPAIR Left   . SHOULDER OPEN ROTATOR CUFF REPAIR Right   . TOTAL KNEE ARTHROPLASTY Bilateral    Social History   Social History  . Marital status: Single    Spouse name: N/A  . Number of children: 1   Occupational History  . Retired, works part-time, 16 hours a week     Social History Main Topics  . Smoking status: Former Smoker    Quit date: 05/11/1983  . Smokeless tobacco: Never Used  . Alcohol use No  . Drug use: No   Current Outpatient Prescriptions on File Prior to Visit  Medication Sig Dispense Refill  . acetaminophen (TYLENOL) 325 MG tablet Take 2 tablets (650 mg total) by mouth every 4 (four) hours as needed for headache or mild pain.    Marland Kitchen ALPRAZolam (XANAX) 0.25 MG tablet TAKE 1 TABLET BY MOUTH THREE TIMES A DAY AS NEEDED ANXIETY  0  . aspirin EC 81 MG EC  tablet Take 1 tablet (81 mg total) by mouth daily.    Marland Kitchen atorvastatin (LIPITOR) 80 MG tablet Take 1 tablet (80 mg total) by mouth daily at 6 PM. 90 tablet 3  . azithromycin (ZITHROMAX) 250 MG tablet Take 2 tablets (500 mg) on  Day 1,  followed by 1 tablet (250 mg) once daily on Days 2 through 5. 6 each 1  . benzonatate (TESSALON) 200 MG capsule Take 1 perle 3 x / day to prevent cough 30 capsule 1  . Blood Glucose Monitoring Suppl (ONE TOUCH ULTRA 2) w/Device KIT Use as directed 1 each 0  . buPROPion (WELLBUTRIN XL) 300 MG 24 hr tablet Take 1 tablet daily 90 tablet 1  . clopidogrel (PLAVIX) 75 MG tablet Take 1 tablet (75 mg total) by mouth daily. 30 tablet 6  . diclofenac (FLECTOR) 1.3 % PTCH Place 1 patch onto the skin daily as needed (for pain).     . Dulaglutide (TRULICITY) 1.96 QI/2.9NL SOPN Please inject 0.75 mg under skin weekly in the am 4 pen 1  . furosemide (LASIX) 40 MG tablet Take 40 mg by mouth 3 (three) times daily.    Marland Kitchen gabapentin (NEURONTIN) 600 MG tablet Take 300 mg by mouth at bedtime.    Marland Kitchen glucose blood (ONETOUCH VERIO) test strip Use as instructed to check sugar 2 times daily. 200 each 5  . HYDROcodone-acetaminophen (NORCO/VICODIN) 5-325 MG tablet Take 0.5-1 tablets by mouth every 6 (six) hours as needed for moderate pain.   0  . insulin aspart (NOVOLOG FLEXPEN) 100 UNIT/ML FlexPen Inject 16-20 Units into the skin 3 (three) times daily with meals. 10 pen 11  . Insulin Glargine  (LANTUS SOLOSTAR) 100 UNIT/ML Solostar Pen Inject 70 Units into the skin at bedtime. 10 pen 11  . Insulin Pen Needle (CAREFINE PEN NEEDLES) 32G X 4 MM MISC Use 4x a day 300 each 11  . Lancets (ONETOUCH ULTRASOFT) lancets Use as instructed 100 each 12  . losartan (COZAAR) 50 MG tablet TAKE 1 TABLET (50 MG TOTAL) BY MOUTH DAILY. 90 tablet 1  . ondansetron (ZOFRAN) 4 MG tablet Take 1 tablet (4 mg total) by mouth every 6 (six) hours. 12 tablet 0  . rOPINIRole (REQUIP) 3 MG tablet TAKE 1 TABLET (3 MG TOTAL) BY MOUTH 4 (FOUR) TIMES DAILY. 90 tablet 1  . nitroGLYCERIN (NITROSTAT) 0.4 MG SL tablet Place 1 tablet (0.4 mg total) under the tongue every 5 (five) minutes x 3 doses as needed for chest pain. (Patient not taking: Reported on 08/11/2016) 25 tablet 4   No current facility-administered medications on file prior to visit.    Allergies  Allergen Reactions  . Brilinta [Ticagrelor] Shortness Of Breath and Other (See Comments)    Reaction:  Chest pain   . Lyrica [Pregabalin] Other (See Comments)    Reaction: Blurry vision   . Aspirin Other (See Comments)    Reaction:  Unknown  Pt is able to take in small doses.   . Crestor [Rosuvastatin] Other (See Comments)    Reaction:  Leg cramps   . Lipitor [Atorvastatin] Other (See Comments)    Reaction:  Leg cramps  Pt is currently on this medication.    . Minocycline Hcl Other (See Comments)    Reaction:  Unknown   . Oruvail [Ketoprofen] Other (See Comments)    Reaction:  Unknown   . Tricor [Fenofibrate] Other (See Comments)    Reaction:  Unknown   . Vasotec [Enalaprilat] Other (See Comments)  Reaction:  Unknown   . Victoza [Liraglutide] Other (See Comments)    Reaction:  Unknown   . Zinc Other (See Comments)    Reaction:  Unknown    Family History  Problem Relation Age of Onset  . Heart disease Mother   . Kidney disease Father   . AAA (abdominal aortic aneurysm) Father   . Heart disease Son   . Colon cancer Neg Hx   . Colon polyps Neg Hx    . Esophageal cancer Neg Hx   . Pancreatic cancer Neg Hx   . Stomach cancer Neg Hx   . Liver disease Neg Hx   . Diabetes Neg Hx     PE: BP 134/74 (BP Location: Left Arm, Patient Position: Sitting)   Pulse 94   Ht 5' 1.5" (1.562 m)   Wt 229 lb (103.9 kg)   SpO2 95%   BMI 42.57 kg/m  Wt Readings from Last 3 Encounters:  08/11/16 229 lb (103.9 kg)  06/28/16 236 lb (107 kg)  06/24/16 234 lb (106.1 kg)   Constitutional:Obese, in NAD Eyes: PERRLA, EOMI, no exophthalmos ENT: moist mucous membranes, no thyromegaly, no cervical lymphadenopathy Cardiovascular: Tachycardia, RR, No MRG, No LE edema (resolved) Respiratory: CTA B Gastrointestinal: abdomen soft, NT, ND, BS+ Musculoskeletal: no deformities, strength intact in all 4 Skin: moist, warm, no rashes Neurological: no tremor with outstretched hands, DTR normal in all 4  ASSESSMENT: 1. DM2, insulin-dependent, uncontrolled, with complications - CAD - s/p NSTEMI - PN - CKD  PLAN:  1. Patient with long-standing, uncontrolled diabetes, previously on  premixed insulin, but changed to basal-bolus insulin and to release the at last visit, with very good results. Her sugars are overall better, with exceptions of few spikes in the 200s related to forgetting her insulin, dietary indiscretions, or use of prednisone. Sometimes she forgets to take Lantus at night due to falling asleep so I advised her to move this earlier in the evening, after dinner. - HbA1c was 10.3% at last check >> now 7.4% (much better!) - I suggested to:  Patient Instructions  Please continue: - Lantus 70 units, but move this after dinner. - Novolog:  - 16 units before a smaller meal  - 20 units before a larger meal - Trulicity 1.5 mg weekly  Please return in 3 months with your sugar log.   - continue checking sugars at different times of the day - check 3-4 times a day, rotating checks - advised for yearly eye exams >> She is up-to-date - Return to clinic in 3  mo with sugar log   Philemon Kingdom, MD PhD [facility]

## 2016-08-15 ENCOUNTER — Other Ambulatory Visit: Payer: Self-pay | Admitting: Internal Medicine

## 2016-08-15 DIAGNOSIS — E1140 Type 2 diabetes mellitus with diabetic neuropathy, unspecified: Secondary | ICD-10-CM

## 2016-08-29 ENCOUNTER — Other Ambulatory Visit: Payer: Self-pay | Admitting: Internal Medicine

## 2016-08-30 ENCOUNTER — Other Ambulatory Visit: Payer: Self-pay

## 2016-08-30 MED ORDER — DULAGLUTIDE 1.5 MG/0.5ML ~~LOC~~ SOAJ: SUBCUTANEOUS | 2 refills | Status: DC

## 2016-08-31 ENCOUNTER — Ambulatory Visit (INDEPENDENT_AMBULATORY_CARE_PROVIDER_SITE_OTHER): Payer: PPO | Admitting: Cardiovascular Disease

## 2016-08-31 ENCOUNTER — Encounter: Payer: Self-pay | Admitting: Cardiovascular Disease

## 2016-08-31 VITALS — BP 132/75 | HR 79 | Ht 61.5 in | Wt 230.8 lb

## 2016-08-31 DIAGNOSIS — E782 Mixed hyperlipidemia: Secondary | ICD-10-CM

## 2016-08-31 DIAGNOSIS — Z955 Presence of coronary angioplasty implant and graft: Secondary | ICD-10-CM | POA: Diagnosis not present

## 2016-08-31 DIAGNOSIS — I10 Essential (primary) hypertension: Secondary | ICD-10-CM | POA: Diagnosis not present

## 2016-08-31 DIAGNOSIS — I214 Non-ST elevation (NSTEMI) myocardial infarction: Secondary | ICD-10-CM

## 2016-08-31 LAB — COMPREHENSIVE METABOLIC PANEL
ALBUMIN: 4 g/dL (ref 3.6–5.1)
ALK PHOS: 97 U/L (ref 33–130)
ALT: 9 U/L (ref 6–29)
AST: 10 U/L (ref 10–35)
BUN: 22 mg/dL (ref 7–25)
CO2: 26 mmol/L (ref 20–31)
Calcium: 9.3 mg/dL (ref 8.6–10.4)
Chloride: 109 mmol/L (ref 98–110)
Creat: 1.28 mg/dL — ABNORMAL HIGH (ref 0.60–0.93)
Glucose, Bld: 78 mg/dL (ref 65–99)
POTASSIUM: 4.6 mmol/L (ref 3.5–5.3)
Sodium: 146 mmol/L (ref 135–146)
TOTAL PROTEIN: 6.4 g/dL (ref 6.1–8.1)
Total Bilirubin: 0.4 mg/dL (ref 0.2–1.2)

## 2016-08-31 LAB — LIPID PANEL
CHOLESTEROL: 148 mg/dL (ref <200)
HDL: 39 mg/dL — ABNORMAL LOW (ref >50)
LDL Cholesterol: 74 mg/dL (ref <100)
TRIGLYCERIDES: 173 mg/dL — AB (ref <150)
Total CHOL/HDL Ratio: 3.8 Ratio (ref <5.0)
VLDL: 35 mg/dL — AB (ref <30)

## 2016-08-31 NOTE — Progress Notes (Signed)
Cardiology Office Note   Date:  09/01/2016   ID:  Janet, Choi 23-Aug-1940, MRN [id number]  PCP:  Alesia Richards, MD  Cardiologist:   Skeet Latch, MD   Chief Complaint  Patient presents with  . Follow-up    Pt states no Sx.       History of Present Illness: Janet Choi is a 76 y.o. female with CAD s/p NSTEMI with PCI of the LAD and LCx, hypertensive heart disease, hyperlipidemia, OSA, diabetes, morbid obesity and CKD III who presents for follow up.  Janet Choi presented to [facility] 04/2016 with NSTEMI.  She underwent LHC that revealed 95-99% mid LAD and LCx lesions that were successfully opened with Onyx Resolute DESs. Rash PCI of the left circumflex artery was complicated by microembolization. Left ventriculogram revealed LVEF 40-50%.  Subsequent echo revealed LVEF 50-55% with akinesis of the distal septal and apical myocardium. There is also akinesis of the apical inferior myocardium and grade 1 diastolic dysfunction.   She was readmitted with chest pain on the same day she was discharged from the hospital.  This was not felt to be recurrent ischemia but ticagrelor was switched to clopidogrel.  During that hospitalization Prozac was stopped due to interaction with clopidogrel. She followed up with Rosaria Ferries on 04/26/16 and was feeling weak but otherwise well.  She was again seen in the ED with chest pain on 12/20.  Imdur was added to her regimen.  Since her last appointment Janet Choi has been doing well.  She hasn't Experienced any chest pain or pressure. She also denies shortness of breath. She has not been exercising. She is retired from [employer] and is eligible for a membership at [employer]. She hopes to start exercising soon. She is limited by pain in her foot that makes it hard for her to walk. She denies lower extremity edema, orthopnea    Past Medical History:  Diagnosis Date  . Anemia    hx (04/14/2016)  . Anxiety   . Arthritis    "severe in my  back; hands; ankles" (04/14/2016)  . Basal cell carcinoma    "several burned off; one cut off"  . CAD in native artery    a. NSTEMI 04/2016 - s/p DES toLAD and LCx. PCI to LCx notable for microembolization during cath.  . Chest pain- reslved with stopping Brilinta now on Plaix 04/16/2016  . Chronic lower back pain   . CKD (chronic kidney disease), stage III   . Depression   . DJD (degenerative joint disease)   . Family history of adverse reaction to anesthesia    "half-sister used to get real sick" (04/14/2016)  . GERD (gastroesophageal reflux disease)   . History of gout   . Hyperlipidemia   . Hypertension   . IBS (irritable bowel syndrome)   . Ischemic cardiomyopathy    a. 04/2016: EF 40-50% by cath, 50-55% +WMA by echo.  . 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 ([place]) ~ 2015  . Morbid obesity ([place])   . NSTEMI (non-ST elevated myocardial infarction) ([place]) 04/14/2016  . OSA on CPAP   . Peripheral neuropathy   . Peripheral vascular disease ([place])   . RLS (restless legs syndrome)   . Spinal stenosis   . Type II diabetes mellitus ([place])   . Vitamin D deficiency     Past Surgical History:  Procedure Laterality Date  . APPENDECTOMY    . BACK SURGERY    . BASAL CELL  CARCINOMA EXCISION Left    leg  . CARDIAC CATHETERIZATION N/A 04/15/2016   Procedure: Left Heart Cath and Coronary Angiography;  Surgeon: Belva Crome, MD;  Location: [facility];  Service: Cardiovascular;  Laterality: N/A;  . CARDIAC CATHETERIZATION N/A 04/15/2016   Procedure: Coronary Stent Intervention;  Surgeon: Belva Crome, MD;  Location: [facility];  Service: Cardiovascular;  Laterality: N/A;  Mid LAD Mid CFX  . CATARACT EXTRACTION, BILATERAL Bilateral   . JOINT REPLACEMENT    . LUMBAR DISC SURGERY  X 2  . MELANOMA EXCISION Left    "towards the back of my neck"  . SHOULDER ARTHROSCOPY W/ ROTATOR CUFF REPAIR Left   . SHOULDER OPEN ROTATOR CUFF REPAIR Right   . TOTAL KNEE ARTHROPLASTY Bilateral        Current Outpatient Prescriptions  Medication Sig Dispense Refill  . acetaminophen (TYLENOL) 325 MG tablet Take 2 tablets (650 mg total) by mouth every 4 (four) hours as needed for headache or mild pain.    Marland Kitchen ALPRAZolam (XANAX) 0.25 MG tablet TAKE 1 TABLET BY MOUTH THREE TIMES A DAY AS NEEDED ANXIETY  0  . aspirin EC 81 MG EC tablet Take 1 tablet (81 mg total) by mouth daily.    Marland Kitchen atorvastatin (LIPITOR) 80 MG tablet Take 1 tablet (80 mg total) by mouth daily at 6 PM. 90 tablet 3  . benzonatate (TESSALON) 200 MG capsule Take 1 perle 3 x / day to prevent cough 30 capsule 1  . Blood Glucose Monitoring Suppl (ONE TOUCH ULTRA 2) w/Device KIT Use as directed 1 each 0  . buPROPion (WELLBUTRIN XL) 300 MG 24 hr tablet Take 1 tablet daily 90 tablet 1  . Cholecalciferol ([employer] VIT D 5000 HIGH-POTENCY) 5000 units capsule Take 5,000 Units by mouth daily.    . clopidogrel (PLAVIX) 75 MG tablet Take 1 tablet (75 mg total) by mouth daily. 30 tablet 6  . diclofenac (FLECTOR) 1.3 % PTCH Place 1 patch onto the skin daily as needed (for pain).     . furosemide (LASIX) 40 MG tablet Take 40 mg by mouth 3 (three) times daily.    Marland Kitchen gabapentin (NEURONTIN) 600 MG tablet TAKE 1/2 TO 1 TABLET THREE TIMES DAILY FOR PAIN. 270 tablet 1  . glucose blood (ONETOUCH VERIO) test strip Use as instructed to check sugar 2 times daily. 200 each 5  . insulin aspart (NOVOLOG FLEXPEN) 100 UNIT/ML FlexPen Inject 16-20 Units into the skin 3 (three) times daily with meals. 10 pen 11  . Insulin Glargine (LANTUS SOLOSTAR) 100 UNIT/ML Solostar Pen Inject 70 Units into the skin at bedtime. 10 pen 11  . Insulin Pen Needle (CAREFINE PEN NEEDLES) 32G X 4 MM MISC Use 4x a day 300 each 11  . Lancets (ONETOUCH ULTRASOFT) lancets Use as instructed 100 each 12  . losartan (COZAAR) 50 MG tablet TAKE 1 TABLET (50 MG TOTAL) BY MOUTH DAILY. 90 tablet 1  . nitroGLYCERIN (NITROSTAT) 0.4 MG SL tablet Place 1 tablet (0.4 mg total) under the tongue every 5  (five) minutes x 3 doses as needed for chest pain. 25 tablet 4  . Omega-3 Fatty Acids (FISH OIL) 1000 MG CAPS Take by mouth daily.    Marland Kitchen rOPINIRole (REQUIP) 3 MG tablet TAKE 1 TABLET (3 MG TOTAL) BY MOUTH 4 (FOUR) TIMES DAILY. 90 tablet 1  . TRULICITY 1.06 YI/9.4WN SOPN PLEASE INJECT 0.75 MG UNDER SKIN WEEKLY IN THE AM  1   No  current facility-administered medications for this visit.     Allergies:   Brilinta [ticagrelor]; Lyrica [pregabalin]; Aspirin; Crestor [rosuvastatin]; Lipitor [atorvastatin]; Minocycline hcl; Oruvail [ketoprofen]; Tricor [fenofibrate]; Vasotec [enalaprilat]; Victoza [liraglutide]; and Zinc    Social History:  The patient  reports that she quit smoking about 33 years ago. Her smoking use included Cigarettes. She has a 62.50 pack-year smoking history. She has quit using smokeless tobacco. She reports that she does not drink alcohol or use drugs.   Family History:  The patient's family history includes AAA (abdominal aortic aneurysm) in her father; Heart disease in her mother and son; Kidney disease in her father.    ROS:  Please see the history of present illness.   Otherwise, review of systems are positive for none.   All other systems are reviewed and negative.    PHYSICAL EXAM: VS:  BP 132/75   Pulse 79   Ht 5' 1.5" (1.562 m)   Wt 104.7 kg (230 lb 12.8 oz)   BMI 42.90 kg/m  , BMI Body mass index is 42.9 kg/m. GENERAL:  Well appearing HEENT:  Pupils equal round and reactive, fundi not visualized, oral mucosa unremarkable NECK:  No jugular venous distention, waveform within normal limits, carotid upstroke brisk and symmetric, no bruits, no thyromegaly LYMPHATICS:  No cervical adenopathy LUNGS:  Clear to auscultation bilaterally HEART:  RRR.  PMI not displaced or sustained,S1 and S2 within normal limits, no S3, no S4, no clicks, no rubs, no murmurs ABD:  Flat, positive bowel sounds normal in frequency in pitch, no bruits, no rebound, no guarding, no midline  pulsatile mass, no hepatomegaly, no splenomegaly EXT:  2 plus pulses throughout, no edema, no cyanosis no clubbing SKIN:  No rashes no nodules NEURO:  Cranial nerves II through XII grossly intact, motor grossly intact throughout PSYCH:  Cognitively intact, oriented to person place and time   EKG:  EKG is not ordered today.  Echo 04/15/16: Study Conclusions  - Left ventricle: The cavity size was normal. There was moderate   focal basal and mild concentric hypertrophy. Systolic function   was normal. The estimated ejection fraction was in the range of   50% to 55%. There is akinesis of the distal septal and apical   myocardium. There is akinesis of the apicalinferior myocardium.   There was an increased relative contribution of atrial   contraction to ventricular filling. Doppler parameters are   consistent with abnormal left ventricular relaxation (grade 1   diastolic dysfunction). Doppler parameters are consistent with   high ventricular filling pressure. - Aortic valve: Mildly to moderately calcified annulus. Mildly   calcified leaflets. - Mitral valve: Calcified annulus. Mildly calcified leaflets .   There was trivial regurgitation. - Atrial septum: No defect or patent foramen ovale was identified.  LHC12/7/17: Non-ST elevation myocardial infarction with identification of 95-99% segmental mid LAD stenosis and focal eccentric 95-99% mid circumflex stenosis.  The first diagonal is small and contains segmental 80% stenosis. The coronaries are otherwise widely patent.  Successful drug-eluting stent using Onyx Resolute to reduce a 95% mid LAD stenosis to 0% with TIMI grade 3 flow. Final stent diameter 3.0 mm.  Successful drug-eluting stent using Onyx Resolute to reduce a 95% mid circumflex stenosis to 0% with TIMI grade 3 flow. Post stent diameter 3.5 mm. Intervention on this vessel was, complicated by microembolization to the the most distal obtuse marginal beyond a  bifurcation.  Mild hypokinesis of the anterior wall. EF 40-50%.  Recent Labs: 04/18/2016:  B Natriuretic Peptide 463.5 06/24/2016: Hemoglobin 10.2; Magnesium 1.9; Platelets 261; TSH 1.30 08/31/2016: ALT 9; BUN 22; Creat 1.28; Potassium 4.6; Sodium 146    Lipid Panel    Component Value Date/Time   CHOL 148 08/31/2016 1005   TRIG 173 (H) 08/31/2016 1005   HDL 39 (L) 08/31/2016 1005   CHOLHDL 3.8 08/31/2016 1005   VLDL 35 (H) 08/31/2016 1005   LDLCALC 74 08/31/2016 1005      Wt Readings from Last 3 Encounters:  08/31/16 104.7 kg (230 lb 12.8 oz)  08/11/16 103.9 kg (229 lb)  06/28/16 107 kg (236 lb)      ASSESSMENT AND PLAN:  # CAD s/p NSTEMI:  Janet Choi had an NSTEMI 04/2016.  She needs to continue on dual antiplatelet therapy through 04/2017. She is doing well and has no angina. I did encourage her to start increasing her exercise.  Continue aspirin, atorvastatin and clopidogrel. She has not been on beta blockers due to symptomatic bradycardia. Consider challenge with carvedilol.   # Hyperlipidemia: # Hypertriglyceridemia: She is due to check fasting lipids and CMP. Continue atorvastatin and fish oil.   # Hypertension: Continue losartan and furosemide.  # Morbid obesity: Encouraged 90-150 minutes of exercise weekly.   Current medicines are reviewed at length with the patient today.  The patient has concerns regarding medicines.  The following changes have been made:  no change  Labs/ tests ordered today include:   Orders Placed This Encounter  Procedures  . Lipid panel  . Comprehensive metabolic panel     Disposition:   FU with Mate Alegria C. Oval Linsey, MD, [facility] in 6 months   This note was written with the assistance of speech recognition software.  Please excuse any transcriptional errors.  Signed, [place] C. Oval Linsey, MD, [facility]  09/01/2016 5:41 PM    [place]

## 2016-08-31 NOTE — Patient Instructions (Signed)
Medication Instructions:  START FISH OIL 1,000 MG DAILY  Labwork: LP/CMET AT [facility] ON FIRST FLOOR   Testing/Procedures: NONE  Follow-Up: Your physician wants you to follow-up in: [place] will receive a reminder letter in the mail two months in advance. If you don't receive a letter, please call our office to schedule the follow-up appointment.  If you need a refill on your cardiac medications before your next appointment, please call your pharmacy.

## 2016-09-06 ENCOUNTER — Telehealth: Payer: Self-pay | Admitting: Internal Medicine

## 2016-09-06 ENCOUNTER — Other Ambulatory Visit: Payer: Self-pay

## 2016-09-06 MED ORDER — BASAGLAR KWIKPEN 100 UNIT/ML ~~LOC~~ SOPN: PEN_INJECTOR | SUBCUTANEOUS | 0 refills | Status: DC

## 2016-09-06 NOTE — Telephone Encounter (Signed)
Pt needs the lantus to be changed to basaglar called into [facility] because she has hit the donut hole Pt needs this called in by tomorrow AM please

## 2016-09-06 NOTE — Telephone Encounter (Signed)
Submitted basaglar

## 2016-09-16 DIAGNOSIS — M65342 Trigger finger, left ring finger: Secondary | ICD-10-CM | POA: Diagnosis not present

## 2016-09-16 DIAGNOSIS — M75122 Complete rotator cuff tear or rupture of left shoulder, not specified as traumatic: Secondary | ICD-10-CM | POA: Diagnosis not present

## 2016-09-20 ENCOUNTER — Other Ambulatory Visit: Payer: Self-pay | Admitting: Internal Medicine

## 2016-09-22 ENCOUNTER — Encounter: Payer: Self-pay | Admitting: *Deleted

## 2016-09-26 NOTE — Progress Notes (Signed)
MEDICARE ANNUAL WELLNESS VISIT AND FOLLOW UP  Assessment:   Essential hypertension - continue medications, DASH diet, exercise and monitor at home. Call if greater than 130/80.  -     CBC with Differential/Platelet -     BASIC METABOLIC PANEL WITH GFR -     Hepatic function panel -     TSH  NSTEMI (non-ST elevated myocardial infarction) (HCC) Control blood pressure, cholesterol, glucose, increase exercise.   Hypertensive heart disease without heart failure - continue medications, DASH diet, exercise and monitor at home. Call if greater than 130/80.  -     BASIC METABOLIC PANEL WITH GFR  CAD in native artery Control blood pressure, cholesterol, glucose, increase exercise.   OSA on CPAP Sleep apnea- continue CPAP, weight loss advised.   Controlled type 2 diabetes mellitus with stage 3 chronic kidney disease, without long-term current use of insulin ([place]) Discussed general issues about diabetes pathophysiology and management., Educational material distributed., Suggested low cholesterol diet., Encouraged aerobic exercise., Discussed foot care., Reminded to get yearly retinal exam. Continue follow up Dr. Darnell Level -     BASIC METABOLIC PANEL WITH GFR  Uncontrolled type 2 diabetes mellitus with stage 3 chronic kidney disease, with long-term current use of insulin ([place]) Discussed general issues about diabetes pathophysiology and management., Educational material distributed., Suggested low cholesterol diet., Encouraged aerobic exercise., Discussed foot care., Reminded to get yearly retinal exam. -     BASIC METABOLIC PANEL WITH GFR  CKD (chronic kidney disease), stage III Increase fluids, avoid NSAIDS, monitor sugars, will monitor - may need referral nephrology -     BASIC METABOLIC PANEL WITH GFR  Hyperlipidemia -continue medications, check lipids, decrease fatty foods, increase activity.  -     Lipid panel  Diverticulosis of large intestine without hemorrhage  History of colonic  polyps  Morbid Obesity with co morbidities - long discussion about weight loss, diet, and exercise  Vitamin D deficiency -     VITAMIN D 25 Hydroxy (Vit-D Deficiency, Fractures)  Medication management -     Magnesium  RLS (restless legs syndrome) Check iron  BMI 38.0-38.9,adult - long discussion about weight loss, diet, and exercise  Medicare annual wellness visit, subsequent Get MGM  Iron deficiency anemia, unspecified iron deficiency anemia type -     Iron and TIBC -     Ferritin -     Vitamin B12  Anxiety   Future Appointments Date Time Provider [place]  11/30/2016 11:00 AM Philemon Kingdom, MD [id number] None  12/28/2016 2:30 PM [name], MD [facility] None     Plan:   During the course of the visit the patient was educated and counseled about appropriate screening and preventive services including:    Pneumococcal vaccine   Influenza vaccine  Td vaccine  Screening electrocardiogram  Screening mammography  Bone densitometry screening  Colorectal cancer screening  Diabetes screening  Glaucoma screening  Nutrition counseling   Advanced directives: given info/requested  Subjective:   Janet Choi is a 76 y.o. female who presents for Medicare Annual Wellness Visit and 3 month follow up on hypertension,diabetes, hyperlipidemia, vitamin D def, poor compliance.   Her blood pressure has been controlled at home, she is on verapamil, lasix, today [name] is BP: 124/60 She does not workout. She denies chest pain, shortness of breath, dizziness.  Following Dr. Berenice Primas for her lower back pain left radicular pain and left arm.  She had NSTEMI with PCI LAD and LCx 04/2016, on plavix follows Cardio,  can come off the plavix in Dec and wants to get back on prozac.  She is on cholesterol medication, lopid BID and zetia and can not tolerate statins, denies myalgias. Her cholesterol is not at goal. The cholesterol last visit was:   Lab Results   Component Value Date   CHOL 148 08/31/2016   HDL 39 (L) 08/31/2016   LDLCALC 74 08/31/2016   TRIG 173 (H) 08/31/2016   CHOLHDL 3.8 08/31/2016   She has been working on diet and exercise for diabetes, on bASA not on ACE/ARB, she is on trulicity and aspart and basalgalar, she is staying in the 140s', , she is following Dr. 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 Last A1C in the office was:  Lab Results  Component Value Date   HGBA1C 7.4 08/11/2016   Lab Results  Component Value Date   CREATININE 1.28 (H) 08/31/2016   BUN 22 08/31/2016   NA 146 08/31/2016   K 4.6 08/31/2016   CL 109 08/31/2016   CO2 26 08/31/2016  Last GFR, she does not see nephrology.  Lab Results  Component Value Date   GFRNONAA 32 (L) 06/24/2016   Patient is on Vitamin D supplement. Lab Results  Component Value Date   VD25OH 40 06/24/2016   BMI is Body mass index is 42.68 kg/m., she is working on diet and exercise and has done well.  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   Names of Other Physician/Practitioners you currently use: 1. [facility]- here for primary care 2. Dr. Satira Sark, eye doctor, last visit 07/2016 3. Dr. Luretha Rued, dentist, last visit 08/2016 4. Dr. Lorin Mercy, spinal stenosis.  Patient Care Team: [name], MD as PCP - General (Internal Medicine) Marygrace Drought, MD as Consulting Physician (Ophthalmology) Inda Castle, MD as Consulting Physician (Gastroenterology) Larey Dresser, MD as Consulting Physician (Cardiology) Nobie Putnam, MD as Consulting Physician (Oncology) Tania Ade, MD as Consulting Physician (Orthopedic Surgery)  Medication Review Current Outpatient Prescriptions on File Prior to  Visit  Medication Sig Dispense Refill  . acetaminophen (TYLENOL) 325 MG tablet Take 2 tablets (650 mg total) by mouth every 4 (four) hours as needed for headache or mild pain.    Marland Kitchen ALPRAZolam (XANAX) 0.25 MG tablet TAKE 1 TABLET BY MOUTH THREE TIMES A DAY AS NEEDED ANXIETY  0  . aspirin EC 81 MG EC tablet Take 1 tablet (81 mg total) by mouth daily.    Marland Kitchen atorvastatin (LIPITOR) 80 MG tablet Take 1 tablet (80 mg total) by mouth daily at 6 PM. 90 tablet 3  . benzonatate (TESSALON) 200 MG capsule Take 1 perle 3 x / day to prevent cough 30 capsule 1  . Blood Glucose Monitoring Suppl (ONE TOUCH ULTRA 2) w/Device KIT Use as directed 1 each 0  . buPROPion (WELLBUTRIN XL) 300 MG 24 hr tablet Take 1 tablet daily 90 tablet 1  . Cholecalciferol ([employer] VIT D 5000 HIGH-POTENCY) 5000 units capsule Take 5,000 Units by mouth daily.    . clopidogrel (PLAVIX) 75 MG tablet Take 1 tablet (75 mg total) by mouth daily. 30 tablet 6  . diclofenac (FLECTOR) 1.3 % PTCH Place 1 patch onto the skin daily  as needed (for pain).     . furosemide (LASIX) 40 MG tablet Take 40 mg by mouth 3 (three) times daily.    Marland Kitchen gabapentin (NEURONTIN) 600 MG tablet TAKE 1/2 TO 1 TABLET THREE TIMES DAILY FOR PAIN. 270 tablet 1  . glucose blood (ONETOUCH VERIO) test strip Use as instructed to check sugar 2 times daily. 200 each 5  . insulin aspart (NOVOLOG FLEXPEN) 100 UNIT/ML FlexPen Inject 16-20 Units into the skin 3 (three) times daily with meals. 10 pen 11  . Insulin Glargine (BASAGLAR KWIKPEN) 100 UNIT/ML SOPN Inject 70 into the skin at bedtime. 25 pen 0  . Insulin Pen Needle (CAREFINE PEN NEEDLES) 32G X 4 MM MISC Use 4x a day 300 each 11  . Lancets (ONETOUCH ULTRASOFT) lancets Use as instructed 100 each 12  . losartan (COZAAR) 50 MG tablet TAKE 1 TABLET (50 MG TOTAL) BY MOUTH DAILY. 90 tablet 1  . nitroGLYCERIN (NITROSTAT) 0.4 MG SL tablet Place 1 tablet (0.4 mg total) under the tongue every 5 (five) minutes x 3 doses as needed for chest  pain. 25 tablet 4  . Omega-3 Fatty Acids (FISH OIL) 1000 MG CAPS Take by mouth daily.    Marland Kitchen rOPINIRole (REQUIP) 3 MG tablet TAKE 1 TABLET (3 MG TOTAL) BY MOUTH 4 (FOUR) TIMES DAILY. 90 tablet 1  . TRULICITY 5.09 TO/6.7TI SOPN PLEASE INJECT 0.75 MG UNDER SKIN WEEKLY IN THE AM  1   No current facility-administered medications on file prior to visit.     Current Problems (verified) Patient Active Problem List   Diagnosis Date Noted  . 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 CAD in native artery 04/19/2016  . Anxiety 04/19/2016  . Hypertensive heart disease without heart failure   . S/P coronary artery stent placement   . NSTEMI (non-ST elevated myocardial infarction) ([place]) 04/14/2016  . CKD (chronic kidney disease), stage III 04/14/2016  . Back pain 04/14/2016  . Iron deficiency anemia 08/15/2015  . OSA on CPAP 04/16/2015  . BMI 38.0-38.9,adult 04/14/2015  . Medicare annual wellness visit, subsequent 04/14/2015  . RLS (restless legs syndrome) 03/20/2014  . Uncontrolled type 2 diabetes mellitus with stage 3 chronic kidney disease, with long-term current use of insulin ([place]) 03/20/2014  . Medication management 09/12/2013  . Morbid obesity 05/14/2013  . Vitamin D deficiency 05/14/2013  . 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 Hyperlipidemia 02/24/2009  . Essential hypertension 02/24/2009  . Diverticulosis of large intestine 02/24/2009  . History of colonic polyps 02/24/2009    Screening Tests Immunization History  Administered Date(s) Administered  . DT 04/14/2015  . Hepatitis B 07/09/1999, 08/09/1999, 01/09/2000  . Influenza, High Dose Seasonal PF 03/20/2014  . Influenza,inj,quad, With Preservative 04/26/2016  . Influenza-Unspecified 03/28/2015, 04/14/2015  . Pneumococcal Conjugate-13 03/20/2014  . Pneumococcal Polysaccharide-23 10/15/2009  . Td 03/19/2004    Preventative care: Last colonoscopy: 2010 with Dr. Deatra Ina Last mammogram: 2012 Last pap smear/pelvic exam:  remote [id number] [place] AB 2007 Echo 2017 EF 38-25%, diastolic grade 1 dysfunction.   Prior vaccinations: TD or Tdap: 2016  Influenza: 2017 Pneumococcal: 2011  Prevnar 13 2015 Shingles/Zostavax: declines due to cost  History reviewed: allergies, current medications, past family history, past medical history, past social history, past surgical history and problem list  Allergies Allergies  Allergen Reactions  . Brilinta [Ticagrelor] Shortness Of Breath and Other (See Comments)    Reaction:  Chest pain   . Lyrica [Pregabalin] Other (See Comments)    Reaction: Blurry vision   .  Aspirin Other (See Comments)    Reaction:  Unknown  Pt is able to take in small doses.   . Crestor [Rosuvastatin] Other (See Comments)    Reaction:  Leg cramps   . Lipitor [Atorvastatin] Other (See Comments)    Reaction:  Leg cramps  Pt is currently on this medication.    . Minocycline Hcl Other (See Comments)    Reaction:  Unknown   . Oruvail [Ketoprofen] Other (See Comments)    Reaction:  Unknown   . Tricor [Fenofibrate] Other (See Comments)    Reaction:  Unknown   . Vasotec [Enalaprilat] Other (See Comments)    Reaction:  Unknown   . Victoza [Liraglutide] Other (See Comments)    Reaction:  Unknown   . Zinc Other (See Comments)    Reaction:  Unknown     SURGICAL HISTORY She  has a past surgical history that includes Total knee arthroplasty (Bilateral); Lumbar disc surgery (X 2); Appendectomy; Joint replacement; Back surgery; Melanoma excision (Left); Excision basal cell carcinoma (Left); Shoulder open rotator cuff repair (Right); Shoulder arthroscopy w/ rotator cuff repair (Left); Cataract extraction, bilateral (Bilateral); Cardiac catheterization (N/A, 04/15/2016); and Cardiac catheterization (N/A, 04/15/2016). FAMILY HISTORY Her family history includes AAA (abdominal aortic aneurysm) in her father; Heart disease in her mother and son; Kidney disease in her father. SOCIAL HISTORY She  reports that she  quit smoking about 33 years ago. Her smoking use included Cigarettes. She has a 62.50 pack-year smoking history. She has quit using smokeless tobacco. She reports that she does not drink alcohol or use drugs.  MEDICARE WELLNESS OBJECTIVES: Physical activity: Current Exercise Habits: The patient does not participate in regular exercise at present Cardiac risk factors: Cardiac Risk Factors include: advanced age (>93men, >6 women);dyslipidemia;diabetes mellitus;hypertension;obesity (BMI >30kg/m2);sedentary lifestyle;family history of premature cardiovascular disease Depression/mood screen:   Depression screen [facility] 2/9 09/27/2016  Decreased Interest 0  Down, Depressed, Hopeless 0  PHQ - 2 Score 0    ADLs:  In your present state of health, do you have any difficulty performing the following activities: 09/27/2016 06/24/2016  Hearing? N N  Vision? N N  Difficulty concentrating or making decisions? N N  Walking or climbing stairs? Y N  Dressing or bathing? N N  Doing errands, shopping? N N  Preparing Food and eating ? - -  Using the Toilet? - -  In the past six months, have you accidently leaked urine? - -  Do you have problems with loss of bowel control? - -  Managing your Medications? - -  Managing your Finances? - -  Housekeeping or managing your Housekeeping? - -  Some recent data might be hidden     Cognitive Testing  Alert? Yes  Normal Appearance?Yes  Oriented to person? Yes  Place? Yes   Time? Yes  Recall of three objects?  Yes  Can perform simple calculations? Yes  Displays appropriate judgment?Yes  Can read the correct time from a watch face?Yes  EOL planning: Does Patient Have a Medical Advance Directive?: No   Objective:   Blood pressure 124/60, pulse 74, temperature 97.7 F (36.5 C), resp. rate 14, height 5' 1.5" (1.562 m), weight 229 lb 9.6 oz (104.1 kg), SpO2 95 %. Body mass index is 42.68 kg/m.  General appearance: alert, no distress, WD/WN,  female HEENT:  normocephalic, sclerae anicteric, TMs pearly, nares patent, no discharge or erythema, pharynx normal Oral cavity: MMM, no lesions Neck: supple, no lymphadenopathy, no thyromegaly, no masses Heart: RRR, normal S1,  S2, no murmurs Lungs: CTA bilaterally, no wheezes, rhonchi, or rales Abdomen: +bs, soft, obese non tender, non distended, no masses, no hepatomegaly, no splenomegaly Musculoskeletal: nontender, no swelling, no obvious deformity Extremities: 1-2+ edema, no cyanosis, no clubbing Pulses: 2+ symmetric, upper and lower extremities, normal cap refill Neurological: alert, oriented x 3, CN2-12 intact, strength normal upper extremities and lower extremities, sensation decreased bilateral legs up to mid shin, DTRs 2+ throughout, no cerebellar signs, gait wide and antalgic Psychiatric: normal affect, behavior normal, pleasant  Breast: defer Gyn: defer Rectal: defer  Medicare Attestation I have personally reviewed: The patient's medical and social history Their use of alcohol, tobacco or illicit drugs Their current medications and supplements The patient's functional ability including ADLs,fall risks, home safety risks, cognitive, and hearing and visual impairment Diet and physical activities Evidence for depression or mood disorders  The patient's weight, height, BMI, and visual acuity have been recorded in the chart.  I have made referrals, counseling, and provided education to the patient based on review of the above and I have provided the patient with a written personalized care plan for preventive services.     Vicie Mutters, PA-C   09/27/2016

## 2016-09-27 ENCOUNTER — Encounter: Payer: Self-pay | Admitting: Physician Assistant

## 2016-09-27 ENCOUNTER — Ambulatory Visit (INDEPENDENT_AMBULATORY_CARE_PROVIDER_SITE_OTHER): Payer: PPO | Admitting: Physician Assistant

## 2016-09-27 ENCOUNTER — Ambulatory Visit: Payer: Self-pay | Admitting: Internal Medicine

## 2016-09-27 VITALS — BP 124/60 | HR 74 | Temp 97.7°F | Resp 14 | Ht 61.5 in | Wt 229.6 lb

## 2016-09-27 DIAGNOSIS — Z0001 Encounter for general adult medical examination with abnormal findings: Secondary | ICD-10-CM | POA: Diagnosis not present

## 2016-09-27 DIAGNOSIS — D509 Iron deficiency anemia, unspecified: Secondary | ICD-10-CM

## 2016-09-27 DIAGNOSIS — N183 Chronic kidney disease, stage 3 unspecified: Secondary | ICD-10-CM

## 2016-09-27 DIAGNOSIS — R6889 Other general symptoms and signs: Secondary | ICD-10-CM | POA: Diagnosis not present

## 2016-09-27 DIAGNOSIS — I119 Hypertensive heart disease without heart failure: Secondary | ICD-10-CM | POA: Diagnosis not present

## 2016-09-27 DIAGNOSIS — G4733 Obstructive sleep apnea (adult) (pediatric): Secondary | ICD-10-CM

## 2016-09-27 DIAGNOSIS — Z0000 Encounter for general adult medical examination without abnormal findings: Secondary | ICD-10-CM

## 2016-09-27 DIAGNOSIS — K5730 Diverticulosis of large intestine without perforation or abscess without bleeding: Secondary | ICD-10-CM

## 2016-09-27 DIAGNOSIS — I2510 Atherosclerotic heart disease of native coronary artery without angina pectoris: Secondary | ICD-10-CM

## 2016-09-27 DIAGNOSIS — F419 Anxiety disorder, unspecified: Secondary | ICD-10-CM

## 2016-09-27 DIAGNOSIS — Z860100 Personal history of colon polyps, unspecified: Secondary | ICD-10-CM

## 2016-09-27 DIAGNOSIS — G2581 Restless legs syndrome: Secondary | ICD-10-CM

## 2016-09-27 DIAGNOSIS — E782 Mixed hyperlipidemia: Secondary | ICD-10-CM | POA: Diagnosis not present

## 2016-09-27 DIAGNOSIS — E1165 Type 2 diabetes mellitus with hyperglycemia: Secondary | ICD-10-CM | POA: Diagnosis not present

## 2016-09-27 DIAGNOSIS — E1122 Type 2 diabetes mellitus with diabetic chronic kidney disease: Secondary | ICD-10-CM | POA: Diagnosis not present

## 2016-09-27 DIAGNOSIS — I214 Non-ST elevation (NSTEMI) myocardial infarction: Secondary | ICD-10-CM

## 2016-09-27 DIAGNOSIS — E559 Vitamin D deficiency, unspecified: Secondary | ICD-10-CM | POA: Diagnosis not present

## 2016-09-27 DIAGNOSIS — Z794 Long term (current) use of insulin: Secondary | ICD-10-CM

## 2016-09-27 DIAGNOSIS — I10 Essential (primary) hypertension: Secondary | ICD-10-CM

## 2016-09-27 DIAGNOSIS — Z79899 Other long term (current) drug therapy: Secondary | ICD-10-CM

## 2016-09-27 DIAGNOSIS — Z6838 Body mass index (BMI) 38.0-38.9, adult: Secondary | ICD-10-CM

## 2016-09-27 DIAGNOSIS — IMO0002 Reserved for concepts with insufficient information to code with codable children: Secondary | ICD-10-CM

## 2016-09-27 DIAGNOSIS — Z9989 Dependence on other enabling machines and devices: Secondary | ICD-10-CM

## 2016-09-27 LAB — CBC WITH DIFFERENTIAL/PLATELET
BASOS ABS: 0 {cells}/uL (ref 0–200)
Basophils Relative: 0 %
EOS PCT: 3 %
Eosinophils Absolute: 150 cells/uL (ref 15–500)
HCT: 31.3 % — ABNORMAL LOW (ref 35.0–45.0)
Hemoglobin: 9.6 g/dL — ABNORMAL LOW (ref 11.7–15.5)
LYMPHS PCT: 17 %
Lymphs Abs: 850 cells/uL (ref 850–3900)
MCH: 24.3 pg — AB (ref 27.0–33.0)
MCHC: 30.7 g/dL — AB (ref 32.0–36.0)
MCV: 79.2 fL — ABNORMAL LOW (ref 80.0–100.0)
MPV: 9.1 fL (ref 7.5–12.5)
Monocytes Absolute: 250 cells/uL (ref 200–950)
Monocytes Relative: 5 %
NEUTROS ABS: 3750 {cells}/uL (ref 1500–7800)
Neutrophils Relative %: 75 %
PLATELETS: 260 10*3/uL (ref 140–400)
RBC: 3.95 MIL/uL (ref 3.80–5.10)
RDW: 17 % — AB (ref 11.0–15.0)
WBC: 5 10*3/uL (ref 3.8–10.8)

## 2016-09-27 NOTE — Patient Instructions (Addendum)
[facility]  7 a.m.-6:30 p.m., Monday 7 a.m.-5 p.m., Tuesday-Friday Schedule an appointment by calling [phone].     Bad carbs also include fruit juice, alcohol, and sweet tea. These are empty calories that do not signal to your brain that you are full.   Please remember the good carbs are still carbs which convert into sugar. So please measure them out no more than 1/2-1 cup of rice, oatmeal, pasta, and beans  Veggies are however free foods! Pile them on.   Not all fruit is created equal. Please see the list below, the fruit at the bottom is higher in sugars than the fruit at the top. Please avoid all dried fruits.     Fatigue Fatigue is feeling tired all of the time, a lack of energy, or a lack of motivation. Occasional or mild fatigue is often a normal response to activity or life in general. However, long-lasting (chronic) or extreme fatigue may indicate an underlying medical condition. Follow these instructions at home: Watch your fatigue for any changes. The following actions may help to lessen any discomfort you are feeling:  Talk to your health care provider about how much sleep you need each night. Try to get the required amount every night.  Take medicines only as directed by your health care provider.  Eat a healthy and nutritious diet. Ask your health care provider if you need help changing your diet.  Drink enough fluid to keep your urine clear or pale yellow.  Practice ways of relaxing, such as yoga, meditation, massage therapy, or acupuncture.  Exercise regularly.  Change situations that cause you stress. Try to keep your work and personal routine reasonable.  Do not abuse illegal drugs.  Limit alcohol intake to no more than 1 drink per day for nonpregnant women and 2 drinks per day for men. One drink equals 12 ounces of beer, 5 ounces of wine, or 1 ounces of hard liquor.  Take a multivitamin, if directed by your health care  provider. Contact a health care provider if:  Your fatigue does not get better.  You have a fever.  You have unintentional weight loss or gain.  You have headaches.  You have difficulty:  Falling asleep.  Sleeping throughout the night.  You feel angry, guilty, anxious, or sad.  You are unable to have a bowel movement (constipation).  You skin is dry.  Your legs or another part of your body is swollen. Get help right away if:  You feel confused.  Your vision is blurry.  You feel faint or pass out.  You have a severe headache.  You have severe abdominal, pelvic, or back pain.  You have chest pain, shortness of breath, or an irregular or fast heartbeat.  You are unable to urinate or you urinate less than normal.  You develop abnormal bleeding, such as bleeding from the rectum, vagina, nose, lungs, or nipples.  You vomit blood.  You have thoughts about harming yourself or committing suicide.  You are worried that you might harm someone else. This information is not intended to replace advice given to you by your health care provider. Make sure you discuss any questions you have with your health care provider. Document Released: 02/21/2007 Document Revised: 10/02/2015 Document Reviewed: 08/28/2013 Elsevier Interactive Patient Education  2017 [place] your blood pressure at home. Go to the ER if any CP, SOB, nausea, dizziness, severe HA, changes vision/speech  Goal BP:  For patients  younger than 60: Goal BP < 140/90. For patients 60 and older: Goal BP < 150/90. For patients with diabetes: Goal BP < 140/90. Your most recent BP: BP: (!) 142/80   Take your medications faithfully as instructed. Maintain a healthy weight. Get at least 150 minutes of aerobic exercise per week. Minimize salt intake. Minimize alcohol intake  DASH Eating Plan DASH stands for "Dietary Approaches to Stop Hypertension." The DASH eating plan is a healthy eating plan that  has been shown to reduce high blood pressure (hypertension). Additional health benefits may include reducing the risk of type 2 diabetes mellitus, heart disease, and stroke. The DASH eating plan may also help with weight loss. WHAT DO I NEED TO KNOW ABOUT THE DASH EATING PLAN? For the DASH eating plan, you will follow these general guidelines:  Choose foods with a percent daily value for sodium of less than 5% (as listed on the food label).  Use salt-free seasonings or herbs instead of table salt or sea salt.  Check with your health care provider or pharmacist before using salt substitutes.  Eat lower-sodium products, often labeled as "lower sodium" or "no salt added."  Eat fresh foods.  Eat more vegetables, fruits, and low-fat dairy products.  Choose whole grains. Look for the word "whole" as the first word in the ingredient list.  Choose fish and skinless chicken or [place] more often than red meat. Limit fish, poultry, and meat to 6 oz (170 g) each day.  Limit sweets, desserts, sugars, and sugary drinks.  Choose heart-healthy fats.  Limit cheese to 1 oz (28 g) per day.  Eat more home-cooked food and less restaurant, buffet, and fast food.  Limit fried foods.  Cook foods using methods other than frying.  Limit canned vegetables. If you do use them, rinse them well to decrease the sodium.  When eating at a restaurant, ask that your food be prepared with less salt, or no salt if possible. WHAT FOODS CAN I EAT? Seek help from a dietitian for individual calorie needs. Grains Whole grain or whole wheat bread. Brown rice. Whole grain or whole wheat pasta. Quinoa, bulgur, and whole grain cereals. Low-sodium cereals. Corn or whole wheat flour tortillas. Whole grain cornbread. Whole grain crackers. Low-sodium crackers. Vegetables Fresh or frozen vegetables (raw, steamed, roasted, or grilled). Low-sodium or reduced-sodium tomato and vegetable juices. Low-sodium or reduced-sodium tomato  sauce and paste. Low-sodium or reduced-sodium canned vegetables.  Fruits All fresh, canned (in natural juice), or frozen fruits. Meat and Other Protein Products Ground beef (85% or leaner), grass-fed beef, or beef trimmed of fat. Skinless chicken or [place]. Ground chicken or [place]. Pork trimmed of fat. All fish and seafood. Eggs. Dried beans, peas, or lentils. Unsalted nuts and seeds. Unsalted canned beans. Dairy Low-fat dairy products, such as skim or 1% milk, 2% or reduced-fat cheeses, low-fat ricotta or cottage cheese, or plain low-fat yogurt. Low-sodium or reduced-sodium cheeses. Fats and Oils Tub margarines without trans fats. Light or reduced-fat mayonnaise and salad dressings (reduced sodium). Avocado. Safflower, olive, or canola oils. Natural peanut or almond butter. Other Unsalted popcorn and pretzels. The items listed above may not be a complete list of recommended foods or beverages. Contact your dietitian for more options. WHAT FOODS ARE NOT RECOMMENDED? Grains White bread. White pasta. White rice. Refined cornbread. Bagels and croissants. Crackers that contain trans fat. Vegetables Creamed or fried vegetables. Vegetables in a cheese sauce. Regular canned vegetables. Regular canned tomato sauce and paste. Regular tomato and vegetable  juices. Fruits Dried fruits. Canned fruit in light or heavy syrup. Fruit juice. Meat and Other Protein Products Fatty cuts of meat. Ribs, chicken wings, bacon, sausage, bologna, salami, chitterlings, fatback, hot dogs, bratwurst, and packaged luncheon meats. Salted nuts and seeds. Canned beans with salt. Dairy Whole or 2% milk, cream, half-and-half, and cream cheese. Whole-fat or sweetened yogurt. Full-fat cheeses or blue cheese. Nondairy creamers and whipped toppings. Processed cheese, cheese spreads, or cheese curds. Condiments Onion and garlic salt, seasoned salt, table salt, and sea salt. Canned and packaged gravies. Worcestershire sauce. Tartar  sauce. Barbecue sauce. Teriyaki sauce. Soy sauce, including reduced sodium. Steak sauce. Fish sauce. Oyster sauce. Cocktail sauce. Horseradish. Ketchup and mustard. Meat flavorings and tenderizers. Bouillon cubes. Hot sauce. Tabasco sauce. Marinades. Taco seasonings. Relishes. Fats and Oils Butter, stick margarine, lard, shortening, ghee, and bacon fat. Coconut, palm kernel, or palm oils. Regular salad dressings. Other Pickles and olives. Salted popcorn and pretzels. The items listed above may not be a complete list of foods and beverages to avoid. Contact your dietitian for more information. WHERE CAN I FIND MORE INFORMATION? National Heart, Lung, and Blood Institute: travelstabloid.com Document Released: 04/15/2011 Document Revised: 09/10/2013 Document Reviewed: 02/28/2013 [facility] Patient Information 2015 [place], [place]. This information is not intended to replace advice given to you by your health care provider. Make sure you discuss any questions you have with your health care provider.

## 2016-09-28 ENCOUNTER — Other Ambulatory Visit: Payer: Self-pay | Admitting: Physician Assistant

## 2016-09-28 DIAGNOSIS — E1122 Type 2 diabetes mellitus with diabetic chronic kidney disease: Secondary | ICD-10-CM

## 2016-09-28 DIAGNOSIS — M109 Gout, unspecified: Secondary | ICD-10-CM

## 2016-09-28 DIAGNOSIS — N183 Chronic kidney disease, stage 3 (moderate): Principal | ICD-10-CM

## 2016-09-28 DIAGNOSIS — D509 Iron deficiency anemia, unspecified: Secondary | ICD-10-CM

## 2016-09-28 LAB — LIPID PANEL
CHOL/HDL RATIO: 4.6 ratio (ref <5.0)
CHOLESTEROL: 175 mg/dL (ref <200)
HDL: 38 mg/dL — AB (ref >50)
LDL Cholesterol: 73 mg/dL (ref <100)
Triglycerides: 321 mg/dL — ABNORMAL HIGH (ref <150)
VLDL: 64 mg/dL — ABNORMAL HIGH (ref <30)

## 2016-09-28 LAB — BASIC METABOLIC PANEL WITH GFR
BUN: 18 mg/dL (ref 7–25)
CALCIUM: 9.2 mg/dL (ref 8.6–10.4)
CHLORIDE: 105 mmol/L (ref 98–110)
CO2: 26 mmol/L (ref 20–31)
CREATININE: 1.28 mg/dL — AB (ref 0.60–0.93)
GFR, EST AFRICAN AMERICAN: 47 mL/min — AB (ref >=60)
GFR, Est Non African American: 41 mL/min — ABNORMAL LOW (ref >=60)
Glucose, Bld: 153 mg/dL — ABNORMAL HIGH (ref 65–99)
Potassium: 3.9 mmol/L (ref 3.5–5.3)
SODIUM: 143 mmol/L (ref 135–146)

## 2016-09-28 LAB — HEPATIC FUNCTION PANEL
ALT: 24 U/L (ref 6–29)
AST: 13 U/L (ref 10–35)
Albumin: 3.9 g/dL (ref 3.6–5.1)
Alkaline Phosphatase: 101 U/L (ref 33–130)
BILIRUBIN DIRECT: 0.1 mg/dL (ref <=0.2)
BILIRUBIN TOTAL: 0.4 mg/dL (ref 0.2–1.2)
Indirect Bilirubin: 0.3 mg/dL (ref 0.2–1.2)
Total Protein: 6.3 g/dL (ref 6.1–8.1)

## 2016-09-28 LAB — TSH: TSH: 0.94 m[IU]/L

## 2016-09-28 LAB — MAGNESIUM: Magnesium: 2 mg/dL (ref 1.5–2.5)

## 2016-09-28 LAB — VITAMIN B12: VITAMIN B 12: 223 pg/mL (ref 200–1100)

## 2016-09-28 LAB — IRON AND TIBC
%SAT: 5 % — ABNORMAL LOW (ref 11–50)
Iron: 23 ug/dL — ABNORMAL LOW (ref 45–160)
TIBC: 495 ug/dL — AB (ref 250–450)
UIBC: 472 ug/dL

## 2016-09-28 LAB — FERRITIN: FERRITIN: 9 ng/mL — AB (ref 20–288)

## 2016-09-28 LAB — URIC ACID: URIC ACID, SERUM: 7.5 mg/dL — AB (ref 2.5–7.0)

## 2016-09-28 LAB — VITAMIN D 25 HYDROXY (VIT D DEFICIENCY, FRACTURES): Vit D, 25-Hydroxy: 50 ng/mL (ref 30–100)

## 2016-09-28 MED ORDER — COLCHICINE 0.6 MG PO TABS: ORAL_TABLET | ORAL | 2 refills | Status: DC

## 2016-09-28 MED ORDER — ALLOPURINOL 100 MG PO TABS: 100.0000 mg | ORAL_TABLET | Freq: Every day | ORAL | 2 refills | Status: DC

## 2016-10-05 DIAGNOSIS — M67912 Unspecified disorder of synovium and tendon, left shoulder: Secondary | ICD-10-CM | POA: Diagnosis not present

## 2016-10-11 ENCOUNTER — Other Ambulatory Visit: Payer: Self-pay | Admitting: Orthopedic Surgery

## 2016-10-11 DIAGNOSIS — M4800 Spinal stenosis, site unspecified: Secondary | ICD-10-CM

## 2016-10-11 DIAGNOSIS — M67912 Unspecified disorder of synovium and tendon, left shoulder: Secondary | ICD-10-CM

## 2016-10-13 ENCOUNTER — Ambulatory Visit (INDEPENDENT_AMBULATORY_CARE_PROVIDER_SITE_OTHER): Payer: PPO | Admitting: Physician Assistant

## 2016-10-13 ENCOUNTER — Encounter: Payer: Self-pay | Admitting: Physician Assistant

## 2016-10-13 VITALS — BP 132/80 | HR 83 | Temp 98.4°F | Resp 14 | Ht 61.5 in | Wt 228.0 lb

## 2016-10-13 DIAGNOSIS — K5730 Diverticulosis of large intestine without perforation or abscess without bleeding: Secondary | ICD-10-CM | POA: Diagnosis not present

## 2016-10-13 DIAGNOSIS — R197 Diarrhea, unspecified: Secondary | ICD-10-CM | POA: Diagnosis not present

## 2016-10-13 LAB — CBC WITH DIFFERENTIAL/PLATELET
BASOS ABS: 0 {cells}/uL (ref 0–200)
BASOS PCT: 0 %
EOS ABS: 129 {cells}/uL (ref 15–500)
Eosinophils Relative: 3 %
HEMATOCRIT: 31.4 % — AB (ref 35.0–45.0)
Hemoglobin: 9.5 g/dL — ABNORMAL LOW (ref 11.7–15.5)
LYMPHS PCT: 27 %
Lymphs Abs: 1161 cells/uL (ref 850–3900)
MCH: 23.9 pg — AB (ref 27.0–33.0)
MCHC: 30.3 g/dL — ABNORMAL LOW (ref 32.0–36.0)
MCV: 78.9 fL — AB (ref 80.0–100.0)
MONO ABS: 301 {cells}/uL (ref 200–950)
MPV: 9.5 fL (ref 7.5–12.5)
Monocytes Relative: 7 %
NEUTROS PCT: 63 %
Neutro Abs: 2709 cells/uL (ref 1500–7800)
Platelets: 229 10*3/uL (ref 140–400)
RBC: 3.98 MIL/uL (ref 3.80–5.10)
RDW: 18.4 % — AB (ref 11.0–15.0)
WBC: 4.3 10*3/uL (ref 3.8–10.8)

## 2016-10-13 LAB — BASIC METABOLIC PANEL WITH GFR
BUN: 13 mg/dL (ref 7–25)
CO2: 24 mmol/L (ref 20–31)
CREATININE: 1.25 mg/dL — AB (ref 0.60–0.93)
Calcium: 8.8 mg/dL (ref 8.6–10.4)
Chloride: 111 mmol/L — ABNORMAL HIGH (ref 98–110)
GFR, EST AFRICAN AMERICAN: 49 mL/min — AB (ref >=60)
GFR, Est Non African American: 42 mL/min — ABNORMAL LOW (ref >=60)
GLUCOSE: 88 mg/dL (ref 65–99)
POTASSIUM: 4.1 mmol/L (ref 3.5–5.3)
Sodium: 143 mmol/L (ref 135–146)

## 2016-10-13 LAB — HEPATIC FUNCTION PANEL
ALK PHOS: 96 U/L (ref 33–130)
ALT: 14 U/L (ref 6–29)
AST: 12 U/L (ref 10–35)
Albumin: 3.7 g/dL (ref 3.6–5.1)
Bilirubin, Direct: 0.1 mg/dL (ref <=0.2)
Indirect Bilirubin: 0.3 mg/dL (ref 0.2–1.2)
TOTAL PROTEIN: 5.8 g/dL — AB (ref 6.1–8.1)
Total Bilirubin: 0.4 mg/dL (ref 0.2–1.2)

## 2016-10-13 MED ORDER — METRONIDAZOLE 500 MG PO TABS: 500.0000 mg | ORAL_TABLET | Freq: Three times a day (TID) | ORAL | 0 refills | Status: AC

## 2016-10-13 MED ORDER — CIPROFLOXACIN HCL 500 MG PO TABS: 500.0000 mg | ORAL_TABLET | Freq: Two times a day (BID) | ORAL | 0 refills | Status: AC

## 2016-10-13 NOTE — Progress Notes (Signed)
Subjective:    Patient ID: Janet Choi, female    DOB: 03-24-1941, 76 y.o.   MRN: [id number]  HPI 76 y.o. WF presents with diarrhea and AB pain x last Saturday.  No food exposures, no one else sick, has history of IBS. Has been eating toast, imodium, yesterday felt better until she ate mac and cheese, roll and then this AM had severe diarrhea in her bed this AM. Last colonoscopy 2007. She is still checking sugars and taking insulin. No fever, chills. Some mild nausea.   Getting MRI 18th on her neck and arm, tried lyrica again without help, has helped some, she is still on it and has stopped gabapentin.   Blood pressure 132/80, pulse 83, temperature 98.4 F (36.9 C), resp. rate 14, height 5' 1.5" (1.562 m), weight 228 lb (103.4 kg), SpO2 95 %.  Medications Current Outpatient Prescriptions on File Prior to Visit  Medication Sig  . acetaminophen (TYLENOL) 325 MG tablet Take 2 tablets (650 mg total) by mouth every 4 (four) hours as needed for headache or mild pain.  Marland Kitchen allopurinol (ZYLOPRIM) 100 MG tablet Take 1 tablet (100 mg total) by mouth daily.  Marland Kitchen ALPRAZolam (XANAX) 0.25 MG tablet TAKE 1 TABLET BY MOUTH THREE TIMES A DAY AS NEEDED ANXIETY  . aspirin EC 81 MG EC tablet Take 1 tablet (81 mg total) by mouth daily.  Marland Kitchen atorvastatin (LIPITOR) 80 MG tablet Take 1 tablet (80 mg total) by mouth daily at 6 PM.  . benzonatate (TESSALON) 200 MG capsule Take 1 perle 3 x / day to prevent cough  . Blood Glucose Monitoring Suppl (ONE TOUCH ULTRA 2) w/Device KIT Use as directed  . buPROPion (WELLBUTRIN XL) 300 MG 24 hr tablet Take 1 tablet daily  . Cholecalciferol ([employer] VIT D 5000 HIGH-POTENCY) 5000 units capsule Take 5,000 Units by mouth daily.  . clopidogrel (PLAVIX) 75 MG tablet Take 1 tablet (75 mg total) by mouth daily.  . colchicine 0.6 MG tablet Take 2 tablets first day of gout flare, then one a day until gout flare is better  . diclofenac (FLECTOR) 1.3 % PTCH Place 1 patch onto the skin daily as  needed (for pain).   . furosemide (LASIX) 40 MG tablet Take 40 mg by mouth 3 (three) times daily.  Marland Kitchen glucose blood (ONETOUCH VERIO) test strip Use as instructed to check sugar 2 times daily.  Marland Kitchen HYDROcodone-acetaminophen (NORCO/VICODIN) 5-325 MG tablet Take 1 tablet by mouth every 6 (six) hours as needed for moderate pain.  Marland Kitchen insulin aspart (NOVOLOG FLEXPEN) 100 UNIT/ML FlexPen Inject 16-20 Units into the skin 3 (three) times daily with meals.  . Insulin Glargine (BASAGLAR KWIKPEN) 100 UNIT/ML SOPN Inject 70 into the skin at bedtime.  . Insulin Pen Needle (CAREFINE PEN NEEDLES) 32G X 4 MM MISC Use 4x a day  . Lancets (ONETOUCH ULTRASOFT) lancets Use as instructed  . losartan (COZAAR) 50 MG tablet TAKE 1 TABLET (50 MG TOTAL) BY MOUTH DAILY.  . nitroGLYCERIN (NITROSTAT) 0.4 MG SL tablet Place 1 tablet (0.4 mg total) under the tongue every 5 (five) minutes x 3 doses as needed for chest pain.  . Omega-3 Fatty Acids (FISH OIL) 1000 MG CAPS Take by mouth daily.  Marland Kitchen rOPINIRole (REQUIP) 3 MG tablet TAKE 1 TABLET (3 MG TOTAL) BY MOUTH 4 (FOUR) TIMES DAILY.  Marland Kitchen tiZANidine (ZANAFLEX) 4 MG tablet Take 4 mg by mouth every 6 (six) hours as needed for muscle spasms.  . TRULICITY 2.42 AS/3.4HD  SOPN PLEASE INJECT 0.75 MG UNDER SKIN WEEKLY IN THE AM   No current facility-administered medications on file prior to visit.     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); Hyperlipidemia; Essential hypertension; Diverticulosis of large intestine; History of colonic polyps; Morbid obesity; Vitamin D deficiency; Medication management; RLS (restless legs syndrome); Uncontrolled type 2 diabetes mellitus with stage 3 chronic kidney disease, with long-term current use of insulin ([place]); BMI 38.0-38.9,adult; Medicare annual wellness visit, subsequent; OSA on CPAP; Iron deficiency anemia; NSTEMI (non-ST elevated myocardial infarction) ([place]); CKD (chronic kidney disease), stage III; Back pain; Hypertensive heart disease  without heart failure; S/P coronary artery stent placement; CAD in native artery; Anxiety; and Gluttony on her problem list.  Review of Systems  Constitutional: Positive for fatigue. Negative for chills, diaphoresis and fever.  HENT: Negative.   Respiratory: Negative.  Negative for cough.   Cardiovascular: Negative.   Gastrointestinal: Positive for abdominal pain, diarrhea and nausea. Negative for abdominal distention, anal bleeding, blood in stool, constipation, rectal pain and vomiting.  Genitourinary: Negative.   Musculoskeletal: Negative for arthralgias, back pain, gait problem, joint swelling, myalgias, neck pain and neck stiffness.  Skin: Negative.   Neurological: Negative for dizziness and headaches.       Objective:   Physical Exam  Constitutional: She is oriented to person, place, and time. She appears well-developed and well-nourished.  Neck: Normal range of motion. Neck supple.  Cardiovascular: Normal rate and regular rhythm.   Pulmonary/Chest: Effort normal and breath sounds normal.  Abdominal: Soft. Bowel sounds are normal. She exhibits no mass. There is tenderness in the left lower quadrant. There is guarding. There is no rigidity, no rebound, no CVA tenderness, no tenderness at McBurney's point and negative Murphy's sign.  Neurological: She is alert and oriented to person, place, and time.  Skin: Skin is warm and dry. No rash noted.  Vitals reviewed.     Assessment & Plan:  Diarrhea LLQ pain Possible diverticulitis, will refer to GI to rule out colitis imodium cipro, flaygl.  Get on probiotic  The patient was advised to call immediately if she has any concerning symptoms in the interval. The patient voices understanding of current treatment options and is in agreement with the current care plan.The patient knows to call the clinic with any problems, questions or concerns or go to the ER if any further progression of symptoms.

## 2016-10-13 NOTE — Patient Instructions (Addendum)
Diverticulitis Diverticulitis is inflammation or infection of small pouches in your colon that form when you have a condition called diverticulosis. The pouches in your colon are called diverticula. Your colon, or large intestine, is where water is absorbed and stool is formed. Complications of diverticulitis can include:  Bleeding.  Severe infection.  Severe pain.  Perforation of your colon.  Obstruction of your colon.  What are the causes? Diverticulitis is caused by bacteria. Diverticulitis happens when stool becomes trapped in diverticula. This allows bacteria to grow in the diverticula, which can lead to inflammation and infection. What increases the risk? People with diverticulosis are at risk for diverticulitis. Eating a diet that does not include enough fiber from fruits and vegetables may make diverticulitis more likely to develop. What are the signs or symptoms? Symptoms of diverticulitis may include:  Abdominal pain and tenderness. The pain is normally located on the left side of the abdomen, but may occur in other areas.  Fever and chills.  Bloating.  Cramping.  Nausea.  Vomiting.  Constipation.  Diarrhea.  Blood in your stool.  How is this diagnosed? Your health care provider will ask you about your medical history and do a physical exam. You may need to have tests done because many medical conditions can cause the same symptoms as diverticulitis. Tests may include:  Blood tests.  Urine tests.  Imaging tests of the abdomen, including X-rays and CT scans.  When your condition is under control, your health care provider may recommend that you have a colonoscopy. A colonoscopy can show how severe your diverticula are and whether something else is causing your symptoms. How is this treated? Most cases of diverticulitis are mild and can be treated at home. Treatment may include:  Taking over-the-counter pain medicines.  Following a clear liquid  diet.  Taking antibiotic medicines by mouth for 7-10 days.  More severe cases may be treated at a hospital. Treatment may include:  Not eating or drinking.  Taking prescription pain medicine.  Receiving antibiotic medicines through an IV tube.  Receiving fluids and nutrition through an IV tube.  Surgery.  Follow these instructions at home:  Follow your health care provider's instructions carefully.  Follow a full liquid diet or other diet as directed by your health care provider. After your symptoms improve, your health care provider may tell you to change your diet. He or she may recommend you eat a high-fiber diet. Fruits and vegetables are good sources of fiber. Fiber makes it easier to pass stool.  Take fiber supplements or probiotics as directed by your health care provider.  Only take medicines as directed by your health care provider.  Keep all your follow-up appointments. Contact a health care provider if:  Your pain does not improve.  You have a hard time eating food.  Your bowel movements do not return to normal. Get help right away if:  Your pain becomes worse.  Your symptoms do not get better.  Your symptoms suddenly get worse.  You have a fever.  You have repeated vomiting.  You have bloody or black, tarry stools. This information is not intended to replace advice given to you by your health care provider. Make sure you discuss any questions you have with your health care provider. Document Released: 02/03/2005 Document Revised: 10/02/2015 Document Reviewed: 03/21/2013 Elsevier Interactive Patient Education  2017 [employer]   Abdominal Pain, Adult Abdominal pain can be caused by many things. Often, abdominal pain is not serious and it gets  better with no treatment or by being treated at home. However, sometimes abdominal pain is serious. Your health care provider will do a medical history and a physical exam to try to determine the cause of your  abdominal pain. Follow these instructions at home:  Take over-the-counter and prescription medicines only as told by your health care provider. Do not take a laxative unless told by your health care provider.  Drink enough fluid to keep your urine clear or pale yellow.  Watch your condition for any changes.  Keep all follow-up visits as told by your health care provider. This is important. Contact a health care provider if:  Your abdominal pain changes or gets worse.  You are not hungry or you lose weight without trying.  You are constipated or have diarrhea for more than 2-3 days.  You have pain when you urinate or have a bowel movement.  Your abdominal pain wakes you up at night.  Your pain gets worse with meals, after eating, or with certain foods.  You are throwing up and cannot keep anything down.  You have a fever. Get help right away if:  Your pain does not go away as soon as your health care provider told you to expect.  You cannot stop throwing up.  Your pain is only in areas of the abdomen, such as the right side or the left lower portion of the abdomen.  You have bloody or black stools, or stools that look like tar.  You have severe pain, cramping, or bloating in your abdomen.  You have signs of dehydration, such as: ? Dark urine, very little urine, or no urine. ? Cracked lips. ? Dry mouth. ? Sunken eyes. ? Sleepiness. ? Weakness. This information is not intended to replace advice given to you by your health care provider. Make sure you discuss any questions you have with your health care provider. Document Released: 02/03/2005 Document Revised: 11/14/2015 Document Reviewed: 10/08/2015 Elsevier Interactive Patient Education  2017 [employer].   Hypoglycemia Hypoglycemia is when the sugar (glucose) level in the blood is too low. Symptoms of low blood sugar may include:  Feeling: ? Hungry. ? Worried or nervous (anxious). ? Sweaty and  clammy. ? Confused. ? Dizzy. ? Sleepy. ? Sick to your stomach (nauseous).  Having: ? A fast heartbeat. ? A headache. ? A change in your vision. ? Jerky movements that you cannot control (seizure). ? Nightmares. ? Tingling or no feeling (numbness) around the mouth, lips, or tongue.  Having trouble with: ? Talking. ? Paying attention (concentrating). ? Moving (coordination). ? Sleeping.  Shaking.  Passing out (fainting).  Getting upset easily (irritability).  Low blood sugar can happen to people who have diabetes and people who do not have diabetes. Low blood sugar can happen quickly, and it can be an emergency. Treating Low Blood Sugar Low blood sugar is often treated by eating or drinking something sugary right away. If you can think clearly and swallow safely, follow the 15:15 rule:  Take 15 grams of a fast-acting carb (carbohydrate). Some fast-acting carbs are: ? 1 tube of glucose gel. ? 3 sugar tablets (glucose pills). ? 6-8 pieces of hard candy. ? 4 oz (120 mL) of fruit juice. ? 4 oz (120 mL) of regular (not diet) soda.  Check your blood sugar 15 minutes after you take the carb.  If your blood sugar is still at or below 70 mg/dL (3.9 mmol/L), take 15 grams of a carb again.  If your  blood sugar does not go above 70 mg/dL (3.9 mmol/L) after 3 tries, get help right away.  After your blood sugar goes back to normal, eat a meal or a snack within 1 hour.  Treating Very Low Blood Sugar If your blood sugar is at or below 54 mg/dL (3 mmol/L), you have very low blood sugar (severe hypoglycemia). This is an emergency. Do not wait to see if the symptoms will go away. Get medical help right away. Call your local emergency services (911 in the [place] Do not drive yourself to the hospital. If you have very low blood sugar and you cannot eat or drink, you may need a glucagon shot (injection). A family member or friend should learn how to check your blood sugar and how to give you a  glucagon shot. Ask your doctor if you need to have a glucagon shot kit at home. Follow these instructions at home: General instructions  Avoid any diets that cause you to not eat enough food. Talk with your doctor before you start any new diet.  Take over-the-counter and prescription medicines only as told by your doctor.  Limit alcohol to no more than 1 drink per day for nonpregnant women and 2 drinks per day for men. One drink equals 12 oz of beer, 5 oz of wine, or 1 oz of hard liquor.  Keep all follow-up visits as told by your doctor. This is important. If You Have Diabetes:   Make sure you know the symptoms of low blood sugar.  Always keep a source of sugar with you, such as: ? Sugar. ? Sugar tablets. ? Glucose gel. ? Fruit juice. ? Regular soda (not diet soda). ? Milk. ? Hard candy. ? Honey.  Take your medicines as told.  Follow your exercise and meal plan. ? Eat on time. Do not skip meals. ? Follow your sick day plan when you cannot eat or drink normally. Make this plan ahead of time with your doctor.  Check your blood sugar as often as told by your doctor. Always check before and after exercise.  Share your diabetes care plan with: ? Your work or school. ? People you live with.  Check your pee (urine) for ketones: ? When you are sick. ? As told by your doctor.  Carry a card or wear jewelry that says you have diabetes. If You Have Low Blood Sugar From Other Causes:   Check your blood sugar as often as told by your doctor.  Follow instructions from your doctor about what you cannot eat or drink. Contact a doctor if:  You have trouble keeping your blood sugar in your target range.  You have low blood sugar often. Get help right away if:  You still have symptoms after you eat or drink something sugary.  Your blood sugar is at or below 54 mg/dL (3 mmol/L).  You have jerky movements that you cannot control.  You pass out. These symptoms may be an  emergency. Do not wait to see if the symptoms will go away. Get medical help right away. Call your local emergency services (911 in the [place] Do not drive yourself to the hospital. This information is not intended to replace advice given to you by your health care provider. Make sure you discuss any questions you have with your health care provider. Document Released: 07/21/2009 Document Revised: 10/02/2015 Document Reviewed: 05/30/2015 Elsevier Interactive Patient Education  [employer].

## 2016-10-14 LAB — AMYLASE: AMYLASE: 31 U/L (ref 21–101)

## 2016-10-25 ENCOUNTER — Ambulatory Visit
Admission: RE | Admit: 2016-10-25 | Discharge: 2016-10-25 | Disposition: A | Discharge status: Home / Self Care | Payer: PPO | Source: Ambulatory Visit | Attending: Orthopedic Surgery | Admitting: Orthopedic Surgery

## 2016-10-25 DIAGNOSIS — M67912 Unspecified disorder of synovium and tendon, left shoulder: Secondary | ICD-10-CM

## 2016-10-25 DIAGNOSIS — M19012 Primary osteoarthritis, left shoulder: Secondary | ICD-10-CM | POA: Diagnosis not present

## 2016-10-25 DIAGNOSIS — M4800 Spinal stenosis, site unspecified: Secondary | ICD-10-CM

## 2016-10-25 DIAGNOSIS — M4802 Spinal stenosis, cervical region: Secondary | ICD-10-CM | POA: Diagnosis not present

## 2016-10-29 ENCOUNTER — Ambulatory Visit: Payer: Self-pay

## 2016-11-01 ENCOUNTER — Encounter (HOSPITAL_COMMUNITY): Payer: Self-pay

## 2016-11-01 ENCOUNTER — Emergency Department (HOSPITAL_COMMUNITY): Payer: PPO

## 2016-11-01 ENCOUNTER — Emergency Department (HOSPITAL_COMMUNITY)
Admission: EM | Admit: 2016-11-01 | Discharge: 2016-11-01 | Disposition: A | Discharge status: Home / Self Care | Payer: PPO | Attending: Emergency Medicine | Admitting: Emergency Medicine

## 2016-11-01 ENCOUNTER — Telehealth: Payer: Self-pay | Admitting: Cardiovascular Disease

## 2016-11-01 DIAGNOSIS — I129 Hypertensive chronic kidney disease with stage 1 through stage 4 chronic kidney disease, or unspecified chronic kidney disease: Secondary | ICD-10-CM | POA: Diagnosis not present

## 2016-11-01 DIAGNOSIS — Z85828 Personal history of other malignant neoplasm of skin: Secondary | ICD-10-CM | POA: Insufficient documentation

## 2016-11-01 DIAGNOSIS — Z7982 Long term (current) use of aspirin: Secondary | ICD-10-CM | POA: Diagnosis not present

## 2016-11-01 DIAGNOSIS — Z79899 Other long term (current) drug therapy: Secondary | ICD-10-CM | POA: Insufficient documentation

## 2016-11-01 DIAGNOSIS — R079 Chest pain, unspecified: Secondary | ICD-10-CM

## 2016-11-01 DIAGNOSIS — N183 Chronic kidney disease, stage 3 (moderate): Secondary | ICD-10-CM | POA: Diagnosis not present

## 2016-11-01 DIAGNOSIS — I119 Hypertensive heart disease without heart failure: Secondary | ICD-10-CM | POA: Diagnosis not present

## 2016-11-01 DIAGNOSIS — Z955 Presence of coronary angioplasty implant and graft: Secondary | ICD-10-CM | POA: Insufficient documentation

## 2016-11-01 DIAGNOSIS — E1140 Type 2 diabetes mellitus with diabetic neuropathy, unspecified: Secondary | ICD-10-CM | POA: Diagnosis not present

## 2016-11-01 DIAGNOSIS — Z87891 Personal history of nicotine dependence: Secondary | ICD-10-CM | POA: Diagnosis not present

## 2016-11-01 DIAGNOSIS — I252 Old myocardial infarction: Secondary | ICD-10-CM | POA: Diagnosis not present

## 2016-11-01 DIAGNOSIS — Z96653 Presence of artificial knee joint, bilateral: Secondary | ICD-10-CM | POA: Insufficient documentation

## 2016-11-01 DIAGNOSIS — Z794 Long term (current) use of insulin: Secondary | ICD-10-CM | POA: Insufficient documentation

## 2016-11-01 DIAGNOSIS — R072 Precordial pain: Secondary | ICD-10-CM | POA: Diagnosis not present

## 2016-11-01 DIAGNOSIS — I2510 Atherosclerotic heart disease of native coronary artery without angina pectoris: Secondary | ICD-10-CM | POA: Diagnosis not present

## 2016-11-01 DIAGNOSIS — E1122 Type 2 diabetes mellitus with diabetic chronic kidney disease: Secondary | ICD-10-CM | POA: Insufficient documentation

## 2016-11-01 LAB — CBC
HEMATOCRIT: 32.6 % — AB (ref 36.0–46.0)
HEMOGLOBIN: 9.6 g/dL — AB (ref 12.0–15.0)
MCH: 24.3 pg — ABNORMAL LOW (ref 26.0–34.0)
MCHC: 29.4 g/dL — AB (ref 30.0–36.0)
MCV: 82.5 fL (ref 78.0–100.0)
Platelets: 238 10*3/uL (ref 150–400)
RBC: 3.95 MIL/uL (ref 3.87–5.11)
RDW: 18.6 % — ABNORMAL HIGH (ref 11.5–15.5)
WBC: 5.1 10*3/uL (ref 4.0–10.5)

## 2016-11-01 LAB — BASIC METABOLIC PANEL
ANION GAP: 9 (ref 5–15)
BUN: 23 mg/dL — ABNORMAL HIGH (ref 6–20)
CO2: 26 mmol/L (ref 22–32)
Calcium: 9.1 mg/dL (ref 8.9–10.3)
Chloride: 103 mmol/L (ref 101–111)
Creatinine, Ser: 1.34 mg/dL — ABNORMAL HIGH (ref 0.44–1.00)
GFR, EST AFRICAN AMERICAN: 44 mL/min — AB (ref >60)
GFR, EST NON AFRICAN AMERICAN: 38 mL/min — AB (ref >60)
GLUCOSE: 158 mg/dL — AB (ref 65–99)
POTASSIUM: 4.3 mmol/L (ref 3.5–5.1)
Sodium: 138 mmol/L (ref 135–145)

## 2016-11-01 LAB — TROPONIN I: Troponin I: <0.03 ng/mL (ref <0.03)

## 2016-11-01 LAB — I-STAT TROPONIN, ED: Troponin i, poc: 0 ng/mL (ref 0.00–0.08)

## 2016-11-01 NOTE — Discharge Instructions (Signed)
Please discuss a stress test for your heart with primary doctor and Cardiologist. Please return for worsening symptoms

## 2016-11-01 NOTE — ED Notes (Signed)
Dark green top for I-stat troponin sent down to main lab. Order for Troponin I to be ran

## 2016-11-01 NOTE — ED Triage Notes (Addendum)
Pt states she began having central chest pain today at home. She took 3 nitro and now is chest pain free. Pt denies any associated symptoms. Skin warm and dry. Pt reports increase in stress lately.

## 2016-11-01 NOTE — ED Provider Notes (Signed)
[facility] DEPT Provider Note   CSN: [id number] Arrival date & time: 11/01/16  1300     History   Chief Complaint Chief Complaint  Patient presents with  . Chest Pain    HPI Janet Choi is a 76 y.o. female.  The history is provided by the patient, medical records and a relative (sister). No language interpreter was used.  Chest Pain   This is a recurrent problem. The current episode started 3 to 5 hours ago. The problem occurs every several days. The problem has been gradually improving. The pain is present in the substernal region. The pain is moderate. The quality of the pain is described as burning. The pain does not radiate. Duration of episode(s) is 4 hours. Pertinent negatives include no abdominal pain, no back pain, no cough, no fever, no palpitations, no shortness of breath and no vomiting. She has tried rest, oxygen and nitroglycerin for the symptoms. The treatment provided moderate relief. Risk factors include being elderly and post-menopausal.  Her past medical history is significant for CAD, hyperlipidemia and hypertension.  Pertinent negatives for past medical history include no seizures.    Past Medical History:  Diagnosis Date  . Anemia    hx (04/14/2016)  . Anxiety   . Arthritis    "severe in my back; hands; ankles" (04/14/2016)  . Basal cell carcinoma    "several burned off; one cut off"  . CAD in native artery    a. NSTEMI 04/2016 - s/p DES toLAD and LCx. PCI to LCx notable for microembolization during cath.  . Chest pain- reslved with stopping Brilinta now on Plaix 04/16/2016  . Chronic lower back pain   . CKD (chronic kidney disease), stage III   . Depression   . DJD (degenerative joint disease)   . Family history of adverse reaction to anesthesia    "half-sister used to get real sick" (04/14/2016)  . GERD (gastroesophageal reflux disease)   . History of gout   . Hyperlipidemia   . Hypertension   . IBS (irritable bowel syndrome)   . Ischemic  cardiomyopathy    a. 04/2016: EF 40-50% by cath, 50-55% +WMA by echo.  . 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 ([place]) ~ 2015  . Morbid obesity ([place])   . NSTEMI (non-ST elevated myocardial infarction) ([place]) 04/14/2016  . OSA on CPAP   . Peripheral neuropathy   . Peripheral vascular disease ([place])   . RLS (restless legs syndrome)   . Spinal stenosis   . Type II diabetes mellitus ([place])   . Vitamin D deficiency     Patient Active Problem List   Diagnosis Date Noted  . 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 CAD in native artery 04/19/2016  . Anxiety 04/19/2016  . Hypertensive heart disease without heart failure   . S/P coronary artery stent placement   . NSTEMI (non-ST elevated myocardial infarction) ([place]) 04/14/2016  . CKD (chronic kidney disease), stage III 04/14/2016  . Back pain 04/14/2016  . Iron deficiency anemia 08/15/2015  . OSA on CPAP 04/16/2015  . BMI 38.0-38.9,adult 04/14/2015  . Medicare annual wellness visit, subsequent 04/14/2015  . RLS (restless legs syndrome) 03/20/2014  . Uncontrolled type 2 diabetes mellitus with stage 3 chronic kidney disease, with long-term current use of insulin ([place]) 03/20/2014  . Medication management 09/12/2013  . Morbid obesity 05/14/2013  . Vitamin D deficiency 05/14/2013  . 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 Hyperlipidemia 02/24/2009  . Essential hypertension 02/24/2009  . Diverticulosis  of large intestine 02/24/2009  . History of colonic polyps 02/24/2009    Past Surgical History:  Procedure Laterality Date  . APPENDECTOMY    . BACK SURGERY    . BASAL CELL CARCINOMA EXCISION Left    leg  . CARDIAC CATHETERIZATION N/A 04/15/2016   Procedure: Left Heart Cath and Coronary Angiography;  Surgeon: Belva Crome, MD;  Location: [facility];  Service: Cardiovascular;  Laterality: N/A;  . CARDIAC CATHETERIZATION N/A 04/15/2016   Procedure: Coronary Stent Intervention;  Surgeon: Belva Crome, MD;  Location: [facility];   Service: Cardiovascular;  Laterality: N/A;  Mid LAD Mid CFX  . CATARACT EXTRACTION, BILATERAL Bilateral   . JOINT REPLACEMENT    . LUMBAR DISC SURGERY  X 2  . MELANOMA EXCISION Left    "towards the back of my neck"  . SHOULDER ARTHROSCOPY W/ ROTATOR CUFF REPAIR Left   . SHOULDER OPEN ROTATOR CUFF REPAIR Right   . TOTAL KNEE ARTHROPLASTY Bilateral     OB History    No data available       Home Medications    Prior to Admission medications   Medication Sig Start Date End Date Taking? Authorizing Provider  acetaminophen (TYLENOL) 325 MG tablet Take 2 tablets (650 mg total) by mouth every 4 (four) hours as needed for headache or mild pain. 04/20/16  Yes Isaiah Serge, NP  allopurinol (ZYLOPRIM) 100 MG tablet Take 1 tablet (100 mg total) by mouth daily. 09/28/16 09/28/17 Yes Vicie Mutters, PA-C  aspirin EC 81 MG EC tablet Take 1 tablet (81 mg total) by mouth daily. 04/21/16  Yes Isaiah Serge, NP  benzonatate (TESSALON) 200 MG capsule Take 1 perle 3 x / day to prevent cough Patient taking differently: Take 200 mg by mouth 3 (three) times daily as needed for cough.  07/07/16  Yes [name], MD  buPROPion (WELLBUTRIN XL) 300 MG 24 hr tablet Take 1 tablet daily Patient taking differently: Take 300 mg by mouth daily.  06/28/16 12/26/16 Yes [name], MD  Cholecalciferol ([employer]S VIT D 5000 HIGH-POTENCY) 5000 units capsule Take 5,000 Units by mouth 2 (two) times daily.    Yes [provider]  clopidogrel (PLAVIX) 75 MG tablet Take 1 tablet (75 mg total) by mouth daily. 04/21/16  Yes Isaiah Serge, NP  colchicine 0.6 MG tablet Take 2 tablets first day of gout flare, then one a day until gout flare is better Patient taking differently: Take 0.6-1.2 mg by mouth See admin instructions. 1.2 mg on the first day of gout flare, then 0.6 mg once a day until flare is better 09/28/16 09/28/17 Yes Vicie Mutters, PA-C  diclofenac (FLECTOR) 1.3 % PTCH Place 1 patch onto the skin daily  as needed (for pain).    Yes [provider]  furosemide (LASIX) 40 MG tablet Take 40-60 mg by mouth every morning.    Yes [provider]  gabapentin (NEURONTIN) 600 MG tablet Take 600 mg by mouth 2 (two) times daily. 08/15/16  Yes [provider]  HYDROcodone-acetaminophen (NORCO/VICODIN) 5-325 MG tablet Take 1 tablet by mouth every 6 (six) hours as needed for moderate pain.   Yes [provider]  insulin aspart (NOVOLOG FLEXPEN) 100 UNIT/ML FlexPen Inject 16-20 Units into the skin 3 (three) times daily with meals. Patient taking differently: Inject 16-20 Units into the skin 2 (two) times daily before a meal.  06/28/16  Yes Philemon Kingdom, MD  Insulin [facility])  100 UNIT/ML SOPN Inject 70 into the skin at bedtime. Patient taking differently: Inject 70 Units into the skin at bedtime.  09/06/16  Yes Philemon Kingdom, MD  losartan (COZAAR) 50 MG tablet TAKE 1 TABLET (50 MG TOTAL) BY MOUTH DAILY. 05/20/16  Yes [name], MD  nitroGLYCERIN (NITROSTAT) 0.4 MG SL tablet Place 1 tablet (0.4 mg total) under the tongue every 5 (five) minutes x 3 doses as needed for chest pain. 04/20/16  Yes Isaiah Serge, NP  rOPINIRole (REQUIP) 3 MG tablet TAKE 1 TABLET (3 MG TOTAL) BY MOUTH 4 (FOUR) TIMES DAILY. 09/20/16  Yes [name], MD  tiZANidine (ZANAFLEX) 4 MG tablet Take 4 mg by mouth every 6 (six) hours as needed for muscle spasms.   Yes [provider]  TRULICITY 1.5 DJ/4.9FW SOPN Inject 1.5 mg into the skin every 7 (seven) days. 10/21/16  Yes [provider]  atorvastatin (LIPITOR) 80 MG tablet Take 1 tablet (80 mg total) by mouth daily at 6 PM. Patient not taking: Reported on 11/01/2016 04/20/16   Erlene Quan, PA-C  Blood Glucose Monitoring Suppl (ONE TOUCH ULTRA 2) w/Device KIT Use as directed 04/27/16   Philemon Kingdom, MD  glucose blood (ONETOUCH VERIO) test strip Use as instructed to check sugar 2 times daily. 06/28/16    Philemon Kingdom, MD  Insulin Pen Needle (CAREFINE PEN NEEDLES) 32G X 4 MM MISC Use 4x a day 06/28/16   Philemon Kingdom, MD  Lancets [facility] ULTRASOFT) lancets Use as instructed 04/27/16   Philemon Kingdom, MD    Family History Family History  Problem Relation Age of Onset  . Heart disease Mother   . Kidney disease Father   . AAA (abdominal aortic aneurysm) Father   . Heart disease Son   . Colon cancer Neg Hx   . Colon polyps Neg Hx   . Esophageal cancer Neg Hx   . Pancreatic cancer Neg Hx   . Stomach cancer Neg Hx   . Liver disease Neg Hx   . Diabetes Neg Hx     Social History Social History  Substance Use Topics  . Smoking status: Former Smoker    Packs/day: 2.50    Years: 25.00    Types: Cigarettes    Quit date: 05/11/1983  . Smokeless tobacco: Former Systems developer  . Alcohol use No     Allergies   Brilinta [ticagrelor]; Aspirin; Lipitor [atorvastatin]; Nsaids; Minocycline hcl; Oruvail [ketoprofen]; Tricor [fenofibrate]; Vasotec [enalaprilat]; and Zinc   Review of Systems Review of Systems  Constitutional: Negative for chills and fever.  HENT: Negative for ear pain and sore throat.   Eyes: Negative for pain and visual disturbance.  Respiratory: Negative for cough and shortness of breath.   Cardiovascular: Positive for chest pain. Negative for palpitations.  Gastrointestinal: Negative for abdominal pain and vomiting.  Genitourinary: Negative for dysuria and hematuria.  Musculoskeletal: Negative for arthralgias and back pain.  Skin: Negative for color change and rash.  Neurological: Negative for seizures and syncope.  All other systems reviewed and are negative.    Physical Exam Updated Vital Signs BP (!) 147/99   Pulse 82   Temp 97.8 F (36.6 C) (Oral)   Resp 16   Ht 5' 1.5" (1.562 m)   Wt 103.9 kg (229 lb)   SpO2 98%   BMI 42.57 kg/m   Physical Exam  Constitutional: She appears well-developed and well-nourished. No distress.  HENT:  Head:  Normocephalic and atraumatic.  Eyes: Conjunctivae are normal.  Neck:  Neck supple.  Cardiovascular: Normal rate and regular rhythm.   No murmur heard. Pulmonary/Chest: Effort normal and breath sounds normal. No respiratory distress.  Abdominal: Soft. There is no tenderness.  Musculoskeletal: She exhibits no edema.  Neurological: She is alert. No cranial nerve deficit. Coordination normal.  5/5 motor strength and intact sensation in all extremities. Finger-to-nose intact bilaterally  Skin: Skin is warm and dry.  Nursing note and vitals reviewed.    ED Treatments / Results  Labs (all labs ordered are listed, but only abnormal results are displayed) Labs Reviewed  BASIC METABOLIC PANEL - Abnormal; Notable for the following:       Result Value   Glucose, Bld 158 (*)    BUN 23 (*)    Creatinine, Ser 1.34 (*)    GFR calc non Af Amer 38 (*)    GFR calc Af Amer 44 (*)    All other components within normal limits  CBC - Abnormal; Notable for the following:    Hemoglobin 9.6 (*)    HCT 32.6 (*)    MCH 24.3 (*)    MCHC 29.4 (*)    RDW 18.6 (*)    All other components within normal limits  TROPONIN I  I-STAT TROPOININ, ED  I-STAT TROPOININ, ED    EKG  EKG Interpretation  Date/Time:  Monday November 01 2016 13:03:46 EDT Ventricular Rate:  88 PR Interval:  136 QRS Duration: 80 QT Interval:  386 QTC Calculation: 467 R Axis:   -51 Text Interpretation:  Normal sinus rhythm Left axis deviation Low voltage QRS Possible Anterolateral infarct , age undetermined Abnormal ekg Confirmed by Carmin Muskrat [phone]) on 11/01/2016 6:23:23 PM       Radiology Dg Chest 2 View  Result Date: 11/01/2016 CLINICAL DATA:  Central chest pain beginning 3 hours ago. EXAM: CHEST  2 VIEW COMPARISON:  04/28/2016 FINDINGS: Heart size is normal. There is aortic atherosclerosis. The lungs are clear except for mild chronic basilar scarring. No edema. No effusions. Ordinary degenerative changes affect the  thoracic spine. Lead artifacts overlie the chest. IMPRESSION: No active disease. Aortic atherosclerosis. Basilar pulmonary scarring. Electronically Signed   By: [facility] Chimes M.D.   On: 11/01/2016 14:19    Procedures Procedures (including critical care time)  Medications Ordered in ED Medications - No data to display   Initial Impression / Assessment and Plan / ED Course  I have reviewed the triage vital signs and the nursing notes.  Pertinent labs & imaging results that were available during my care of the patient were reviewed by me and considered in my medical decision making (see chart for details).     76 year old female history of CAD S/P DES to LAD and LCx in December 2017, CKD stage III, HTN, HLD, morbid obesity who presents with burning substernal chest pain.  She had acute onset of chest pain at 10 AM during stressful episode worrying about her sister with dementia.  Patient states she had chest pain similarly 2 weeks ago but did not present for evaluation.  Today another sister informed patient she needed to come to ED for evaluation of chest pain. Pain is similar to previous NSTEMI in 04/2016 when she had cardiac stents placed.  She took 3x NTG and pain subsized after third dose. She is adamant about not being admitted today.  She denies fevers, cough, shortness breath, abdominal pain, dysuria, rash.  Afebrile, VSS.  Lungs clear to auscultation bilaterally.  Abdomen soft benign throughout.  EKG showing normal  sinus rhythm and left left axis deviation.  CXR showing no active disease.  CBC, BMP unremarkable.  Initial troponin 0, serial troponin undetectable.  Patient with high-risk HEART score of 5. She declines to stay for monitoring and cardiac stress test.  Doubt aortic dissection, PE, or pneumonia.  She agrees to return for worsening symptoms.  She will discuss additional testing with her primary doctor and cardiologist.  Return precautions provided for worsening symptoms. Pt  will f/u with PCP at first availability. Pt verbalized agreement with plan.  Pt care d/w Dr. Vanita Panda  Final Clinical Impressions(s) / ED Diagnoses   Final diagnoses:  Chest pain in adult    New Prescriptions Discharge Medication List as of 11/01/2016  9:01 PM       Lonzie Simmer, Pilar Plate, MD 11/03/16 1106    Carmin Muskrat, MD 11/06/16 [phone]

## 2016-11-01 NOTE — ED Notes (Signed)
Pt refused to put on gown. 

## 2016-11-01 NOTE — ED Notes (Signed)
Contacted lab again concerning troponin. Tech reports it is just now being ran and is a 65min test. Updated family and patient on delay. Pt given graham crackers and ginger ale while waiting. Apologized for wait times. Pt understanding

## 2016-11-01 NOTE — Telephone Encounter (Signed)
New Message  Pt call requesting to speak with Rn. Pt states she took 3 Nitro. She states the pain has stop but would like to know if she needs to go to the hospital.

## 2016-11-01 NOTE — Telephone Encounter (Signed)
Incoming call from the patient. She stated she has taken nitro x3 for chest pain. It has been relieved but her heart rate is 120-130. She has a history of a NSTEMI in 04/2016. She stated that this pain is the same that she had then. Her sister is taking her to the hospital. The [facility] ED has been notified. Will route to the provider for her knowledge.

## 2016-11-02 DIAGNOSIS — M75122 Complete rotator cuff tear or rupture of left shoulder, not specified as traumatic: Secondary | ICD-10-CM | POA: Diagnosis not present

## 2016-11-02 DIAGNOSIS — M7062 Trochanteric bursitis, left hip: Secondary | ICD-10-CM | POA: Diagnosis not present

## 2016-11-02 DIAGNOSIS — M4802 Spinal stenosis, cervical region: Secondary | ICD-10-CM | POA: Diagnosis not present

## 2016-11-02 DIAGNOSIS — M4692 Unspecified inflammatory spondylopathy, cervical region: Secondary | ICD-10-CM | POA: Diagnosis not present

## 2016-11-05 ENCOUNTER — Ambulatory Visit (INDEPENDENT_AMBULATORY_CARE_PROVIDER_SITE_OTHER): Payer: PPO | Admitting: *Deleted

## 2016-11-05 DIAGNOSIS — M109 Gout, unspecified: Secondary | ICD-10-CM

## 2016-11-05 DIAGNOSIS — N183 Chronic kidney disease, stage 3 unspecified: Secondary | ICD-10-CM

## 2016-11-05 DIAGNOSIS — E1122 Type 2 diabetes mellitus with diabetic chronic kidney disease: Secondary | ICD-10-CM | POA: Diagnosis not present

## 2016-11-05 DIAGNOSIS — D509 Iron deficiency anemia, unspecified: Secondary | ICD-10-CM | POA: Diagnosis not present

## 2016-11-05 DIAGNOSIS — E538 Deficiency of other specified B group vitamins: Secondary | ICD-10-CM | POA: Diagnosis not present

## 2016-11-05 DIAGNOSIS — D649 Anemia, unspecified: Secondary | ICD-10-CM | POA: Diagnosis not present

## 2016-11-05 LAB — BASIC METABOLIC PANEL WITH GFR
BUN: 31 mg/dL — AB (ref 7–25)
CHLORIDE: 99 mmol/L (ref 98–110)
CO2: 25 mmol/L (ref 20–31)
Calcium: 8.6 mg/dL (ref 8.6–10.4)
Creat: 1.6 mg/dL — ABNORMAL HIGH (ref 0.60–0.93)
GFR, EST NON AFRICAN AMERICAN: 31 mL/min — AB (ref >=60)
GFR, Est African American: 36 mL/min — ABNORMAL LOW (ref >=60)
Glucose, Bld: 186 mg/dL — ABNORMAL HIGH (ref 65–99)
POTASSIUM: 4 mmol/L (ref 3.5–5.3)
Sodium: 139 mmol/L (ref 135–146)

## 2016-11-05 LAB — CBC WITH DIFFERENTIAL/PLATELET
BASOS PCT: 0 %
Basophils Absolute: 0 cells/uL (ref 0–200)
Eosinophils Absolute: 330 cells/uL (ref 15–500)
Eosinophils Relative: 6 %
HEMATOCRIT: 32.2 % — AB (ref 35.0–45.0)
HEMOGLOBIN: 9.8 g/dL — AB (ref 11.7–15.5)
LYMPHS ABS: 1045 {cells}/uL (ref 850–3900)
Lymphocytes Relative: 19 %
MCH: 24.7 pg — ABNORMAL LOW (ref 27.0–33.0)
MCHC: 30.4 g/dL — ABNORMAL LOW (ref 32.0–36.0)
MCV: 81.1 fL (ref 80.0–100.0)
MONO ABS: 330 {cells}/uL (ref 200–950)
MPV: 9.2 fL (ref 7.5–12.5)
Monocytes Relative: 6 %
NEUTROS ABS: 3795 {cells}/uL (ref 1500–7800)
NEUTROS PCT: 69 %
Platelets: 267 10*3/uL (ref 140–400)
RBC: 3.97 MIL/uL (ref 3.80–5.10)
RDW: 18.8 % — ABNORMAL HIGH (ref 11.0–15.0)
WBC: 5.5 10*3/uL (ref 3.8–10.8)

## 2016-11-05 LAB — IRON AND TIBC
%SAT: 7 % — AB (ref 11–50)
Iron: 29 ug/dL — ABNORMAL LOW (ref 45–160)
TIBC: 445 ug/dL (ref 250–450)
UIBC: 416 ug/dL

## 2016-11-05 NOTE — Progress Notes (Addendum)
Patient here for a NV to check labs.  She states she stopped the iron medications due to having diarrhea.  Patient was seen at the [facility] on 11/01/2016 and was sent home. Patient's vital signs were BP-112/60, pulse-108 and temp-97.5.

## 2016-11-06 LAB — URIC ACID: Uric Acid, Serum: 7.7 mg/dL — ABNORMAL HIGH (ref 2.5–7.0)

## 2016-11-06 LAB — VITAMIN B12: VITAMIN B 12: 263 pg/mL (ref 200–1100)

## 2016-11-07 ENCOUNTER — Other Ambulatory Visit: Payer: Self-pay | Admitting: Cardiology

## 2016-11-08 ENCOUNTER — Other Ambulatory Visit: Payer: Self-pay | Admitting: Physician Assistant

## 2016-11-08 DIAGNOSIS — N183 Chronic kidney disease, stage 3 unspecified: Secondary | ICD-10-CM

## 2016-11-09 ENCOUNTER — Telehealth: Payer: Self-pay | Admitting: Cardiovascular Disease

## 2016-11-09 NOTE — Telephone Encounter (Signed)
Routed to MD for review.

## 2016-11-09 NOTE — Telephone Encounter (Signed)
New message        [facility] Pre-operative Risk Assessment    Request for surgical clearance:  1. What type of surgery is being performed? Left shoulder orthoscopy 2. When is this surgery scheduled?  Not yet  3. Are there any medications that need to be held prior to surgery and how long? Not sure   4. Name of physician performing surgery?  Dorna Leitz   5. What is your office phone and fax number?  [phone] fax [phone]  Janet Choi 11/09/2016, 2:09 PM  _________________________________________________________________   (provider comments below)

## 2016-11-10 ENCOUNTER — Other Ambulatory Visit: Payer: Self-pay | Admitting: Internal Medicine

## 2016-11-11 NOTE — Telephone Encounter (Signed)
Janet Choi had a heart attack 04/2016.  She needs to be on dual antiplatelet therapy through 04/2017 unless there is an emergency.  We can reassess her surgical risk at that time.

## 2016-11-12 ENCOUNTER — Other Ambulatory Visit: Payer: Self-pay | Admitting: Internal Medicine

## 2016-11-12 NOTE — Telephone Encounter (Signed)
Sent to Dr Berenice Primas and left message on Nicole's voicemail

## 2016-11-15 ENCOUNTER — Telehealth: Payer: Self-pay | Admitting: Physician Assistant

## 2016-11-15 ENCOUNTER — Other Ambulatory Visit: Payer: Self-pay | Admitting: Physician Assistant

## 2016-11-15 DIAGNOSIS — J0410 Acute tracheitis without obstruction: Secondary | ICD-10-CM

## 2016-11-15 MED ORDER — ALBUTEROL SULFATE HFA 108 (90 BASE) MCG/ACT IN AERS: 2.0000 | INHALATION_SPRAY | RESPIRATORY_TRACT | 0 refills | Status: DC | PRN

## 2016-11-15 MED ORDER — BENZONATATE 200 MG PO CAPS: 200.0000 mg | ORAL_CAPSULE | Freq: Three times a day (TID) | ORAL | 1 refills | Status: DC | PRN

## 2016-11-15 MED ORDER — AZITHROMYCIN 250 MG PO TABS: ORAL_TABLET | ORAL | 1 refills | Status: DC

## 2016-11-15 NOTE — Telephone Encounter (Signed)
Will send in zpak and albuterol and cough syrup, please an office visit in the office if still having an issues or due to recent ER visit.  Go back to the Er if any SOB, CP, etc.

## 2016-11-15 NOTE — Telephone Encounter (Signed)
Pt informed of rx that was sent & to go to ER if worsen sxs, pt agreed & hung up.

## 2016-11-17 ENCOUNTER — Other Ambulatory Visit: Payer: Self-pay | Admitting: Internal Medicine

## 2016-11-18 ENCOUNTER — Encounter: Payer: Self-pay | Admitting: Physician Assistant

## 2016-11-18 ENCOUNTER — Ambulatory Visit (INDEPENDENT_AMBULATORY_CARE_PROVIDER_SITE_OTHER): Payer: PPO | Admitting: Physician Assistant

## 2016-11-18 VITALS — BP 134/72 | HR 92 | Temp 97.9°F | Resp 16 | Ht 61.5 in | Wt 227.0 lb

## 2016-11-18 DIAGNOSIS — J0100 Acute maxillary sinusitis, unspecified: Secondary | ICD-10-CM | POA: Diagnosis not present

## 2016-11-18 MED ORDER — LEVOFLOXACIN 500 MG PO TABS: 500.0000 mg | ORAL_TABLET | Freq: Every day | ORAL | 0 refills | Status: DC

## 2016-11-18 MED ORDER — PROMETHAZINE-DM 6.25-15 MG/5ML PO SYRP: 5.0000 mL | ORAL_SOLUTION | Freq: Four times a day (QID) | ORAL | 1 refills | Status: DC | PRN

## 2016-11-18 NOTE — Progress Notes (Signed)
Subjective:    Patient ID: [name], female    DOB: [birth year], 76 y.o.   MRN: [id number]  HPI 76 y.o. obese WF with history of HTN, DM2 with CKd, CAD, OSA but not using it right now presents with cough x 3 weeks. She had a zpak July 2nd, albuterol and cough syrup. She is coughing, some productive cough in the morning, sinus mucus is green, some right sided teeth pain/sinus pain. No fever or chills this week. Was in the ER June 25 with chest pain, had negative work up, thought to be anxiety.   Blood pressure 134/72, pulse 92, temperature 97.9 F (36.6 C), resp. rate 16, height 5' 1.5" (1.562 m), weight 227 lb (103 kg), SpO2 92 %.  Medications Current Outpatient Prescriptions on File Prior to Visit  Medication Sig  . acetaminophen (TYLENOL) 325 MG tablet Take 2 tablets (650 mg total) by mouth every 4 (four) hours as needed for headache or mild pain.  Marland Kitchen albuterol (VENTOLIN HFA) 108 (90 Base) MCG/ACT inhaler Inhale 2 puffs into the lungs every 4 (four) hours as needed for wheezing or shortness of breath.  . allopurinol (ZYLOPRIM) 100 MG tablet Take 1 tablet (100 mg total) by mouth daily.  Marland Kitchen aspirin EC 81 MG EC tablet Take 1 tablet (81 mg total) by mouth daily.  Marland Kitchen atorvastatin (LIPITOR) 80 MG tablet Take 1 tablet (80 mg total) by mouth daily at 6 PM.  . benzonatate (TESSALON) 200 MG capsule Take 1 capsule (200 mg total) by mouth 3 (three) times daily as needed for cough.  . Blood Glucose Monitoring Suppl (ONE TOUCH ULTRA 2) w/Device KIT Use as directed  . buPROPion (WELLBUTRIN XL) 300 MG 24 hr tablet Take 1 tablet daily (Patient taking differently: Take 300 mg by mouth daily. )  . Cholecalciferol (CVS VIT D 5000 HIGH-POTENCY) 5000 units capsule Take 5,000 Units by mouth 2 (two) times daily.   . clopidogrel (PLAVIX) 75 MG tablet TAKE 1 TABLET (75 MG TOTAL) BY MOUTH DAILY.  Marland Kitchen colchicine 0.6 MG tablet Take 2 tablets first day of gout flare, then one a day until gout flare is better (Patient  taking differently: Take 0.6-1.2 mg by mouth See admin instructions. 1.2 mg on the first day of gout flare, then 0.6 mg once a day until flare is better)  . diclofenac (FLECTOR) 1.3 % PTCH Place 1 patch onto the skin daily as needed (for pain).   . furosemide (LASIX) 40 MG tablet Take 40-60 mg by mouth every morning.   . gabapentin (NEURONTIN) 600 MG tablet Take 600 mg by mouth 2 (two) times daily.  Marland Kitchen glucose blood (ONETOUCH VERIO) test strip Use as instructed to check sugar 2 times daily.  Marland Kitchen HYDROcodone-acetaminophen (NORCO/VICODIN) 5-325 MG tablet Take 1 tablet by mouth every 6 (six) hours as needed for moderate pain.  Marland Kitchen insulin aspart (NOVOLOG FLEXPEN) 100 UNIT/ML FlexPen Inject 16-20 Units into the skin 3 (three) times daily with meals. (Patient taking differently: Inject 16-20 Units into the skin 2 (two) times daily before a meal. )  . Insulin Glargine (BASAGLAR KWIKPEN) 100 UNIT/ML SOPN Inject 70 into the skin at bedtime. (Patient taking differently: Inject 70 Units into the skin at bedtime. )  . Insulin Pen Needle (CAREFINE PEN NEEDLES) 32G X 4 MM MISC Use 4x a day  . Lancets (ONETOUCH ULTRASOFT) lancets Use as instructed  . losartan (COZAAR) 50 MG tablet TAKE 1 TABLET (50 MG TOTAL) BY MOUTH DAILY.  . nitroGLYCERIN (  NITROSTAT) 0.4 MG SL tablet Place 1 tablet (0.4 mg total) under the tongue every 5 (five) minutes x 3 doses as needed for chest pain.  Marland Kitchen rOPINIRole (REQUIP) 3 MG tablet TAKE 1 TABLET (3 MG TOTAL) BY MOUTH 4 (FOUR) TIMES DAILY.  Marland Kitchen tiZANidine (ZANAFLEX) 4 MG tablet Take 4 mg by mouth every 6 (six) hours as needed for muscle spasms.  . TRULICITY 1.5 ZG/0.1VC SOPN INJECT 1.5 MG WEEKLY   No current facility-administered medications on file prior to visit.     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); Hyperlipidemia; Essential hypertension; Diverticulosis of large intestine; History of colonic polyps; Morbid obesity; Vitamin D deficiency; Medication management; RLS  (restless legs syndrome); Uncontrolled type 2 diabetes mellitus with stage 3 chronic kidney disease, with long-term current use of insulin ([place]); BMI 38.0-38.9,adult; Medicare annual wellness visit, subsequent; OSA on CPAP; Iron deficiency anemia; NSTEMI (non-ST elevated myocardial infarction) ([place]); CKD (chronic kidney disease), stage III; Back pain; Hypertensive heart disease without heart failure; S/P coronary artery stent placement; CAD in native artery; Anxiety; and Gluttony on her problem list.  Review of Systems  Constitutional: Negative for chills and diaphoresis.  HENT: Positive for congestion, postnasal drip, sinus pressure and sneezing. Negative for ear pain and sore throat.   Respiratory: Positive for cough. Negative for chest tightness, shortness of breath and wheezing.   Cardiovascular: Negative.   Gastrointestinal: Negative.   Genitourinary: Negative.   Musculoskeletal: Negative for neck pain.  Neurological: Positive for headaches.       Objective:   Physical Exam  Constitutional: She appears well-developed and well-nourished.  HENT:  Head: Normocephalic and atraumatic.  Right Ear: External ear normal.  Nose: Right sinus exhibits maxillary sinus tenderness. Right sinus exhibits no frontal sinus tenderness. Left sinus exhibits maxillary sinus tenderness. Left sinus exhibits no frontal sinus tenderness.  Eyes: Conjunctivae and EOM are normal.  Neck: Normal range of motion. Neck supple.  Cardiovascular: Normal rate, regular rhythm, normal heart sounds and intact distal pulses.   Pulmonary/Chest: Effort normal and breath sounds normal. No respiratory distress. She has no wheezes.  Abdominal: Soft. Bowel sounds are normal.  Lymphadenopathy:    She has cervical adenopathy.  Skin: Skin is warm and dry.       Assessment & Plan:  Janet Choi was seen today for acute visit.  Diagnoses and all orders for this visit:  Acute maxillary sinusitis, recurrence not specified -      levofloxacin (LEVAQUIN) 500 MG tablet; Take 1 tablet (500 mg total) by mouth daily. -     promethazine-dextromethorphan (PROMETHAZINE-DM) 6.25-15 MG/5ML syrup; Take 5 mLs by mouth 4 (four) times daily as needed for cough.   The patient was advised to call immediately if she has any concerning symptoms in the interval. The patient voices understanding of current treatment options and is in agreement with the current care plan.The patient knows to call the clinic with any problems, questions or concerns or go to the ER if any further progression of symptoms.

## 2016-11-18 NOTE — Patient Instructions (Signed)
Sinusitis, Adult Sinusitis is soreness and inflammation of your sinuses. Sinuses are hollow spaces in the bones around your face. Your sinuses are located:  Around your eyes.  In the middle of your forehead.  Behind your nose.  In your cheekbones.  Your sinuses and nasal passages are lined with a stringy fluid (mucus). Mucus normally drains out of your sinuses. When your nasal tissues become inflamed or swollen, the mucus can become trapped or blocked so air cannot flow through your sinuses. This allows bacteria, viruses, and funguses to grow, which leads to infection. Sinusitis can develop quickly and last for 7?10 days (acute) or for more than 12 weeks (chronic). Sinusitis often develops after a cold. What are the causes? This condition is caused by anything that creates swelling in the sinuses or stops mucus from draining, including:  Allergies.  Asthma.  Bacterial or viral infection.  Abnormally shaped bones between the nasal passages.  Nasal growths that contain mucus (nasal polyps).  Narrow sinus openings.  Pollutants, such as chemicals or irritants in the air.  A foreign object stuck in the nose.  A fungal infection. This is rare.  What increases the risk? The following factors may make you more likely to develop this condition:  Having allergies or asthma.  Having had a recent cold or respiratory tract infection.  Having structural deformities or blockages in your nose or sinuses.  Having a weak immune system.  Doing a lot of swimming or diving.  Overusing nasal sprays.  Smoking.  What are the signs or symptoms? The main symptoms of this condition are pain and a feeling of pressure around the affected sinuses. Other symptoms include:  Upper toothache.  Earache.  Headache.  Bad breath.  Decreased sense of smell and taste.  A cough that may get worse at night.  Fatigue.  Fever.  Thick drainage from your nose. The drainage is often green and  it may contain pus (purulent).  Stuffy nose or congestion.  Postnasal drip. This is when extra mucus collects in the throat or back of the nose.  Swelling and warmth over the affected sinuses.  Sore throat.  Sensitivity to light.  How is this diagnosed? This condition is diagnosed based on symptoms, a medical history, and a physical exam. To find out if your condition is acute or chronic, your health care provider may:  Look in your nose for signs of nasal polyps.  Tap over the affected sinus to check for signs of infection.  View the inside of your sinuses using an imaging device that has a light attached (endoscope).  If your health care provider suspects that you have chronic sinusitis, you may also:  Be tested for allergies.  Have a sample of mucus taken from your nose (nasal culture) and checked for bacteria.  Have a mucus sample examined to see if your sinusitis is related to an allergy.  If your sinusitis does not respond to treatment and it lasts longer than 8 weeks, you may have an MRI or CT scan to check your sinuses. These scans also help to determine how severe your infection is. In rare cases, a bone biopsy may be done to rule out more serious types of fungal sinus disease. How is this treated? Treatment for sinusitis depends on the cause and whether your condition is chronic or acute. If a virus is causing your sinusitis, your symptoms will go away on their own within 10 days. You may be given medicines to relieve your symptoms,   including:  Topical nasal decongestants. They shrink swollen nasal passages and let mucus drain from your sinuses.  Antihistamines. These drugs block inflammation that is triggered by allergies. This can help to ease swelling in your nose and sinuses.  Topical nasal corticosteroids. These are nasal sprays that ease inflammation and swelling in your nose and sinuses.  Nasal saline washes. These rinses can help to get rid of thick mucus in  your nose.  If your condition is caused by bacteria, you will be given an antibiotic medicine. If your condition is caused by a fungus, you will be given an antifungal medicine. Surgery may be needed to correct underlying conditions, such as narrow nasal passages. Surgery may also be needed to remove polyps. Follow these instructions at home: Medicines  Take, use, or apply over-the-counter and prescription medicines only as told by your health care provider. These may include nasal sprays.  If you were prescribed an antibiotic medicine, take it as told by your health care provider. Do not stop taking the antibiotic even if you start to feel better. Hydrate and Humidify  Drink enough water to keep your urine clear or pale yellow. Staying hydrated will help to thin your mucus.  Use a cool mist humidifier to keep the humidity level in your home above 50%.  Inhale steam for 10-15 minutes, 3-4 times a day or as told by your health care provider. You can do this in the bathroom while a hot shower is running.  Limit your exposure to cool or dry air. Rest  Rest as much as possible.  Sleep with your head raised (elevated).  Make sure to get enough sleep each night. General instructions  Apply a warm, moist washcloth to your face 3-4 times a day or as told by your health care provider. This will help with discomfort.  Wash your hands often with soap and water to reduce your exposure to viruses and other germs. If soap and water are not available, use hand sanitizer.  Do not smoke. Avoid being around people who are smoking (secondhand smoke).  Keep all follow-up visits as told by your health care provider. This is important. Contact a health care provider if:  You have a fever.  Your symptoms get worse.  Your symptoms do not improve within 10 days. Get help right away if:  You have a severe headache.  You have persistent vomiting.  You have pain or swelling around your face or  eyes.  You have vision problems.  You develop confusion.  Your neck is stiff.  You have trouble breathing. This information is not intended to replace advice given to you by your health care provider. Make sure you discuss any questions you have with your health care provider. Document Released: 04/26/2005 Document Revised: 12/21/2015 Document Reviewed: 02/19/2015 Elsevier Interactive Patient Education  2017 [place] VIRAL COUGH AND COLD SYMPTOMS:  -Symptoms usually last at least 1 week with the worst symptoms being around day 4.  - colds usually start with a sore throat and end with a cough, and the cough can take 2 weeks to get better.  -No antibiotics are needed for colds, flu, sore throats, cough, bronchitis UNLESS symptoms are longer than 7 days OR if you are getting better then get drastically worse.  -There are a lot of combination medications (Dayquil, Nyquil, Vicks 44, tyelnol cold and sinus, ETC). Please look at the ingredients on the back so that you are treating the correct symptoms and  not doubling up on medications/ingredients.    Medicines you can use  Nasal congestion  - pseudoephedrine (Sudafed)- behind the counter, do not use if you have high blood pressure, medicine that have -D in them.  - phenylephrine (Sudafed PE) -Dextormethorphan + chlorpheniramine (Coridcidin HBP)- okay if you have high blood pressure -Oxymetazoline (Afrin) nasal spray- LIMIT to 3 days -Saline nasal spray -Neti pot (used distilled or bottled water)  Ear pain/congestion  -pseudoephedrine (sudafed) - Nasonex/flonase nasal spray  Fever  -Acetaminophen (Tyelnol) -Ibuprofen (Advil, motrin, aleve)  Sore Throat  -Acetaminophen (Tyelnol) -Ibuprofen (Advil, motrin, aleve) -Drink a lot of water -Gargle with salt water - Rest your voice (don't talk) -Throat sprays -Cough drops  Body Aches  -Acetaminophen (Tyelnol) -Ibuprofen (Advil, motrin,  aleve)  Headache  -Acetaminophen (Tyelnol) -Ibuprofen (Advil, motrin, aleve) - Exedrin, Exedrin Migraine  Allergy symptoms (cough, sneeze, runny nose, itchy eyes) -Claritin or loratadine cheapest but likely the weakest  -Zyrtec or certizine at night because it can make you sleepy -The strongest is allegra or fexafinadine  Cheapest at [employer], [employer], [employer]  Cough  -Dextromethorphan (Delsym)- medicine that has DM in it -Guafenesin (Mucinex/Robitussin) - cough drops - drink lots of water  Chest Congestion  -Guafenesin (Mucinex/Robitussin)  Red Itchy Eyes  - Naphcon-A  Upset Stomach  - Bland diet (nothing spicy, greasy, fried, and high acid foods like tomatoes, oranges, berries) -OKAY- cereal, bread, soup, crackers, rice -Eat smaller more frequent meals -reduce caffeine, no alcohol -Loperamide (Imodium-AD) if diarrhea -Prevacid for heart burn  General health when sick  -Hydration -wash your hands frequently -keep surfaces clean -change pillow cases and sheets often -Get fresh air but do not exercise strenuously -Vitamin D, double up on it - Vitamin C -Zinc

## 2016-11-22 ENCOUNTER — Other Ambulatory Visit: Payer: Self-pay | Admitting: Internal Medicine

## 2016-11-30 ENCOUNTER — Ambulatory Visit (INDEPENDENT_AMBULATORY_CARE_PROVIDER_SITE_OTHER): Payer: PPO | Admitting: Internal Medicine

## 2016-11-30 ENCOUNTER — Encounter: Payer: Self-pay | Admitting: Internal Medicine

## 2016-11-30 VITALS — BP 124/80 | HR 90 | Ht 61.5 in | Wt 231.0 lb

## 2016-11-30 DIAGNOSIS — Z794 Long term (current) use of insulin: Secondary | ICD-10-CM | POA: Diagnosis not present

## 2016-11-30 DIAGNOSIS — E1165 Type 2 diabetes mellitus with hyperglycemia: Secondary | ICD-10-CM

## 2016-11-30 DIAGNOSIS — IMO0002 Reserved for concepts with insufficient information to code with codable children: Secondary | ICD-10-CM

## 2016-11-30 DIAGNOSIS — N183 Chronic kidney disease, stage 3 (moderate): Secondary | ICD-10-CM | POA: Diagnosis not present

## 2016-11-30 DIAGNOSIS — E1122 Type 2 diabetes mellitus with diabetic chronic kidney disease: Secondary | ICD-10-CM | POA: Diagnosis not present

## 2016-11-30 LAB — HEMOGLOBIN A1C: HEMOGLOBIN A1C: 6.7 % — AB (ref 4.6–6.5)

## 2016-11-30 NOTE — Patient Instructions (Addendum)
Please continue: - Basaglar 70 units after dinner - Novolog:  - 16 units before a smaller meal  - 20 units before a larger meal - Trulicity 1.5 mg weekly  Try to call [facility] >> to see Dr. Marval Regal. Address: [address], [place], [place] [address]  Phone: [phone]  Please return in 3 months with your sugar log.

## 2016-11-30 NOTE — Progress Notes (Signed)
Patient ID: SHERIDYN CANINO, female   DOB: 16-Jan-1941, 76 y.o.   MRN: [id number]\  HPI: ALACIA REHMANN is a 76 y.o.-year-old female, returning for f/u for DM2, dx in ~2009, insulin-dependent since 10/2015, uncontrolled, with complications (CAD - s/p NSTEMI, CKD, PN). She is here with her sister who offers part of the hx. Last visit 3.5 mo ago. She has HTA and [employer].  She had a URI >> was on ABx. Sugars higher then (150s).  Last hemoglobin A1c was: Lab Results  Component Value Date   HGBA1C 7.4 08/11/2016   HGBA1C 10.3 (H) 04/14/2016   HGBA1C 9.7 03/16/2016   Pt was on a regimen of 70/30 insulin * 40 units before b'fast * 55 units before dinner tonight and if no lows >> 60 units before dinner Previously: Metformin and Glimepiride. She was on Victoza.  At last visit, we changed to: - Lantus >> Basaglar 70 units at bedtime - Novolog  - B and D (she rarely eats lunch  - if she does: PB sandwich):  - 16 units before a smaller meal  - 20 units before a larger meal - Trulicity 1.5 mg weekly >> She does complain of gas, flatulence. However, she definitely would like to continue with this.  Pt checks her sugars 1-2x a day: - am: 116-287 >> 92-192, 201 (high if forgets insulin at night or if + a late snack) >> 104-156 - 2h after b'fast: n/c >> 280,  327 >> n/c - before lunch: n/c >> 86, 105-173, 211 (Prednisone) >> n/c - 2h after lunch: 237 >> n/c >> 207-541 >> 83-211 >> n/c - before dinner: 181 >> n/c >> 242-396 >> 105-186, 219 >> 103-179 (snack) - 2h after dinner: n/c >> 155-230 >> 177 - bedtime: n/c >> 199-454, 546 >> 90, 107-219, 256 >> 140 - nighttime: n/c No lows. Lowest sugar was109 >> 116 >> 83 >> 104; ? hypoglycemia awareness. Highest 546 >> 310 x1 >> 224 when sick.Lately 179  Glucometer: One Touch Ultra  Pt's meals are: - Breakfast: bagel or 2 pieces toast; coffee + sugar + powdered creamer, sometimes eggs (once a week) - Lunch: 1/2 PB sandwich or goes out - shares a plate with  her sister - Dinner: mac and cheese + veggies; chicken + veggies + some starch - Snacks: after dinner (dinner is at 6 pm, she goes to bed at 1 -2 am) - popcorn, peanuts, sunflower seeds, candy, cookies  - she has CKD, last BUN/creatinine worse: Lab Results  Component Value Date   BUN 31 (H) 11/05/2016   BUN 23 (H) 11/01/2016   CREATININE 1.60 (H) 11/05/2016   CREATININE 1.34 (H) 11/01/2016  On Losartan.  - last set of lipids: Lab Results  Component Value Date   CHOL 175 09/27/2016   HDL 38 (L) 09/27/2016   LDLCALC 73 09/27/2016   TRIG 321 (H) 09/27/2016   CHOLHDL 4.6 09/27/2016   - last eye exam was on 02/2016.No DR. + Glaucoma. Dr. Satira Sark. - she has numbness and tingling in her feet. Has a podiatrist (Dr. Bjorn Loser).  She had an NSTEMI 04/14/2016 >> 2 stents. She was in ED for CP again 04/16/2016 and 04/28/2016. She also had a kidney stone.  She also has a history of HL, anxiety/depression. She stopped Xanax and started Wellbutrin.  ROS: Constitutional: + weight gain, no fatigue, no subjective hyperthermia, no subjective hypothermia Eyes: no blurry vision, no xerophthalmia ENT: no sore throat, no nodules palpated in throat, no dysphagia,  no odynophagia, no hoarseness Cardiovascular: no CP/no SOB/no palpitations/+ leg swelling Respiratory: no cough/no SOB/no wheezing Gastrointestinal: no N/no V/no D/no C/no acid reflux Musculoskeletal: + muscle aches/+ joint aches Skin: no rashes, no hair loss Neurological: no tremors/no dizziness  I reviewed pt's medications, allergies, PMH, social hx, family hx, and changes were documented in the history of present illness. Otherwise, unchanged from my initial visit note.  Past Medical History:  Diagnosis Date  . Anemia    hx (04/14/2016)  . Anxiety   . Arthritis    "severe in my back; hands; ankles" (04/14/2016)  . Basal cell carcinoma    "several burned off; one cut off"  . CAD in native artery    a. NSTEMI 04/2016 - s/p DES toLAD  and LCx. PCI to LCx notable for microembolization during cath.  . Chest pain- reslved with stopping Brilinta now on Plaix 04/16/2016  . Chronic lower back pain   . CKD (chronic kidney disease), stage III   . Depression   . DJD (degenerative joint disease)   . Family history of adverse reaction to anesthesia    "half-sister used to get real sick" (04/14/2016)  . GERD (gastroesophageal reflux disease)   . History of gout   . Hyperlipidemia   . Hypertension   . IBS (irritable bowel syndrome)   . Ischemic cardiomyopathy    a. 04/2016: EF 40-50% by cath, 50-55% +WMA by echo.  . 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 ([place]) ~ 2015  . Morbid obesity ([place])   . NSTEMI (non-ST elevated myocardial infarction) ([place]) 04/14/2016  . OSA on CPAP   . Peripheral neuropathy   . Peripheral vascular disease ([place])   . RLS (restless legs syndrome)   . Spinal stenosis   . Type II diabetes mellitus ([place])   . Vitamin D deficiency    Past Surgical History:  Procedure Laterality Date  . APPENDECTOMY    . BACK SURGERY    . BASAL CELL CARCINOMA EXCISION Left    leg  . CARDIAC CATHETERIZATION N/A 04/15/2016   Procedure: Left Heart Cath and Coronary Angiography;  Surgeon: Belva Crome, MD;  Location: [facility];  Service: Cardiovascular;  Laterality: N/A;  . CARDIAC CATHETERIZATION N/A 04/15/2016   Procedure: Coronary Stent Intervention;  Surgeon: Belva Crome, MD;  Location: [facility];  Service: Cardiovascular;  Laterality: N/A;  Mid LAD Mid CFX  . CATARACT EXTRACTION, BILATERAL Bilateral   . JOINT REPLACEMENT    . LUMBAR DISC SURGERY  X 2  . MELANOMA EXCISION Left    "towards the back of my neck"  . SHOULDER ARTHROSCOPY W/ ROTATOR CUFF REPAIR Left   . SHOULDER OPEN ROTATOR CUFF REPAIR Right   . TOTAL KNEE ARTHROPLASTY Bilateral    Social History   Social History  . Marital status: Single    Spouse name: N/A  . Number of children: 1   Occupational History  . Retired, works  part-time, 16 hours a week    Social History Main Topics  . Smoking status: Former Smoker    Quit date: 05/11/1983  . Smokeless tobacco: Never Used  . Alcohol use No  . Drug use: No   Current Outpatient Prescriptions on File Prior to Visit  Medication Sig Dispense Refill  . acetaminophen (TYLENOL) 325 MG tablet Take 2 tablets (650 mg total) by mouth every 4 (four) hours as needed for headache or mild pain.    Marland Kitchen albuterol (VENTOLIN HFA) 108 (90 Base)  MCG/ACT inhaler Inhale 2 puffs into the lungs every 4 (four) hours as needed for wheezing or shortness of breath. 1 Inhaler 0  . allopurinol (ZYLOPRIM) 100 MG tablet Take 1 tablet (100 mg total) by mouth daily. 30 tablet 2  . aspirin EC 81 MG EC tablet Take 1 tablet (81 mg total) by mouth daily.    Marland Kitchen atorvastatin (LIPITOR) 80 MG tablet Take 1 tablet (80 mg total) by mouth daily at 6 PM. 90 tablet 3  . buPROPion (WELLBUTRIN XL) 300 MG 24 hr tablet Take 1 tablet daily (Patient taking differently: Take 300 mg by mouth daily. ) 90 tablet 1  . Cholecalciferol ([employer] VIT D 5000 HIGH-POTENCY) 5000 units capsule Take 5,000 Units by mouth 2 (two) times daily.     . clopidogrel (PLAVIX) 75 MG tablet TAKE 1 TABLET (75 MG TOTAL) BY MOUTH DAILY. 30 tablet 6  . colchicine 0.6 MG tablet Take 2 tablets first day of gout flare, then one a day until gout flare is better (Patient taking differently: Take 0.6-1.2 mg by mouth See admin instructions. 1.2 mg on the first day of gout flare, then 0.6 mg once a day until flare is better) 30 tablet 2  . diclofenac (FLECTOR) 1.3 % PTCH Place 1 patch onto the skin daily as needed (for pain).     . furosemide (LASIX) 40 MG tablet Take 40-60 mg by mouth every morning.     . gabapentin (NEURONTIN) 600 MG tablet Take 600 mg by mouth 2 (two) times daily.  1  . glucose blood (ONETOUCH VERIO) test strip Use as instructed to check sugar 2 times daily. 200 each 5  . HYDROcodone-acetaminophen (NORCO/VICODIN) 5-325 MG tablet Take 1 tablet  by mouth every 6 (six) hours as needed for moderate pain.    Marland Kitchen insulin aspart (NOVOLOG FLEXPEN) 100 UNIT/ML FlexPen Inject 16-20 Units into the skin 3 (three) times daily with meals. (Patient taking differently: Inject 16-20 Units into the skin 2 (two) times daily before a meal. ) 10 pen 11  . Insulin Glargine (BASAGLAR KWIKPEN) 100 UNIT/ML SOPN INJECT 70 INTO THE SKIN AT BEDTIME. 75 pen 0  . Insulin Pen Needle (CAREFINE PEN NEEDLES) 32G X 4 MM MISC Use 4x a day 300 each 11  . Lancets (ONETOUCH ULTRASOFT) lancets Use as instructed 100 each 12  . losartan (COZAAR) 50 MG tablet TAKE 1 TABLET (50 MG TOTAL) BY MOUTH DAILY. 90 tablet 1  . promethazine-dextromethorphan (PROMETHAZINE-DM) 6.25-15 MG/5ML syrup Take 5 mLs by mouth 4 (four) times daily as needed for cough. 240 mL 1  . rOPINIRole (REQUIP) 3 MG tablet TAKE 1 TABLET (3 MG TOTAL) BY MOUTH 4 (FOUR) TIMES DAILY. 90 tablet 1  . tiZANidine (ZANAFLEX) 4 MG tablet Take 4 mg by mouth every 6 (six) hours as needed for muscle spasms.    . TRULICITY 1.5 RF/1.6BW SOPN INJECT 1.5 MG WEEKLY 2 pen 2  . nitroGLYCERIN (NITROSTAT) 0.4 MG SL tablet Place 1 tablet (0.4 mg total) under the tongue every 5 (five) minutes x 3 doses as needed for chest pain. (Patient not taking: Reported on 11/30/2016) 25 tablet 4   No current facility-administered medications on file prior to visit.    Allergies  Allergen Reactions  . Brilinta [Ticagrelor] Shortness Of Breath and Other (See Comments)    Chest pain (also)  . Aspirin Other (See Comments)    Can tolerate in small doses (is already on a blood thinner AND has kidney disease)  . Nsaids  Other (See Comments)    Patient is taking a blood thinner and has kidney disease  . Minocycline Hcl Other (See Comments)    Welts  . Oruvail [Ketoprofen] Other (See Comments)    Has kidney disease and is taking a blood thinner  . Tricor [Fenofibrate]     Reaction unknown  . Vasotec [Enalaprilat]     Reaction unknown  . Zinc      Reaction unknown   Family History  Problem Relation Age of Onset  . Heart disease Mother   . Kidney disease Father   . AAA (abdominal aortic aneurysm) Father   . Heart disease Son   . Colon cancer Neg Hx   . Colon polyps Neg Hx   . Esophageal cancer Neg Hx   . Pancreatic cancer Neg Hx   . Stomach cancer Neg Hx   . Liver disease Neg Hx   . Diabetes Neg Hx    PE: BP 124/80 (BP Location: Left Arm, Patient Position: Sitting)   Pulse 90   Ht 5' 1.5" (1.562 m)   Wt 231 lb (104.8 kg)   SpO2 95%   BMI 42.94 kg/m  Wt Readings from Last 3 Encounters:  11/30/16 231 lb (104.8 kg)  11/18/16 227 lb (103 kg)  11/01/16 229 lb (103.9 kg)   Constitutional: obese, in NAD Eyes: PERRLA, EOMI, no exophthalmos ENT: moist mucus membranes, no thyromegaly, no cervical lymphadenopathy Cardiovascular: tachycardia, RR, No MRG, + B LE edema - pitting Respiratory: CTA B Gastrointestinal: abdomen soft, NT, ND, BS+ Musculoskeletal: no deformities, strength intact in all 4 Skin: moist, warm, no rashes Neurological: no tremor with outstretched hands, DTR normal in all 4  ASSESSMENT: 1. DM2, insulin-dependent, uncontrolled, with complications - CAD - s/p NSTEMI - PN - CKD - worse  PLAN:  1. Patient with Long-standing, uncontrolled, diabetes, previously on premixed insulin, but doing better after we changed to basal-bolus insulin. She had upper respiratory infection since last visit and her sugars are higher, however, in the last 2 weeks they have improved. - Therefore, I would not suggest to change her diabetes regimen for now - I suggested to:  Patient Instructions  Please continue: - Basaglar 70 units after dinner - Novolog:  - 16 units before a smaller meal  - 20 units before a larger meal - Trulicity 1.5 mg weekly  Try to call [facility] >> to see Dr. Marval Regal. Address: [address], [place], [place] [address]  Phone: [phone]  Please return in 3 months with your sugar  log.    - today will check hbA1c - continue checking sugars at different times of the day - check 3-4x a day, rotating checks - advised for yearly eye exams >> she is UTD - Return to clinic in 3 mo with sugar log   2. CKD -  Worsening -  she was referred to nephrology (Dr. Marval Regal) and she was called to schedule this appointment, however, she did not return the call yet. I strongly advised her to do so and gave her the telephone number and address for [facility].  - She does have leg swelling and she increased her Lasix dose to 120 mg daily for the last 2 days, but this may put a strain on her kidneys. She is also tachycardic and I advised her to stay well hydrated.  Component     Latest Ref Rng & Units 11/30/2016  Hemoglobin A1C     4.6 - 6.5 % 6.7 (  H)  HbA1c much better!  Philemon Kingdom, MD PhD [facility] Endocr[facility]

## 2016-12-22 ENCOUNTER — Other Ambulatory Visit: Payer: Self-pay | Admitting: Internal Medicine

## 2016-12-24 ENCOUNTER — Other Ambulatory Visit: Payer: Self-pay | Admitting: Internal Medicine

## 2016-12-28 ENCOUNTER — Ambulatory Visit (INDEPENDENT_AMBULATORY_CARE_PROVIDER_SITE_OTHER): Payer: PPO | Admitting: Internal Medicine

## 2016-12-28 ENCOUNTER — Encounter: Payer: Self-pay | Admitting: Internal Medicine

## 2016-12-28 VITALS — BP 116/62 | HR 84 | Temp 97.3°F | Resp 18 | Ht 61.5 in | Wt 227.4 lb

## 2016-12-28 DIAGNOSIS — E782 Mixed hyperlipidemia: Secondary | ICD-10-CM | POA: Diagnosis not present

## 2016-12-28 DIAGNOSIS — I10 Essential (primary) hypertension: Secondary | ICD-10-CM | POA: Diagnosis not present

## 2016-12-28 DIAGNOSIS — I2510 Atherosclerotic heart disease of native coronary artery without angina pectoris: Secondary | ICD-10-CM | POA: Diagnosis not present

## 2016-12-28 DIAGNOSIS — M109 Gout, unspecified: Secondary | ICD-10-CM

## 2016-12-28 DIAGNOSIS — E559 Vitamin D deficiency, unspecified: Secondary | ICD-10-CM

## 2016-12-28 DIAGNOSIS — Z79899 Other long term (current) drug therapy: Secondary | ICD-10-CM

## 2016-12-28 DIAGNOSIS — E119 Type 2 diabetes mellitus without complications: Secondary | ICD-10-CM | POA: Diagnosis not present

## 2016-12-28 DIAGNOSIS — Z794 Long term (current) use of insulin: Secondary | ICD-10-CM | POA: Diagnosis not present

## 2016-12-28 DIAGNOSIS — I214 Non-ST elevation (NSTEMI) myocardial infarction: Secondary | ICD-10-CM | POA: Diagnosis not present

## 2016-12-28 NOTE — Progress Notes (Signed)
This very nice 76 y.o. 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      Patient is treated for HTN (1997)  & BP has been controlled at home. Today's BP is at goal -  116/62.  In Dec 2017, she had a NSTEMI & underwent PCA/Stenting x 2 Patient has had no complaints of any cardiac type chest pain, palpitations, dyspnea/orthopnea/PND, dizziness, claudication, or dependent edema.     Hyperlipidemia is controlled with diet & meds. Patient denies myalgias or other med SE's. Last Lipids were at goal albeit elevated Trig's: Lab Results  Component Value Date   CHOL 175 09/27/2016   HDL 38 (L) 09/27/2016   LDLCALC 73 09/27/2016   TRIG 321 (H) 09/27/2016   CHOLHDL 4.6 09/27/2016      Also, the patient has history of  Gluttony / Severe Morbid Obesity (BMI 43+) and now insulin requiring T2_DM managed by Dr Cruzita Lederer.  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  Patient has upcoming appt with Dr Marval Regal. She has denies symptoms of reactive hypoglycemia, diabetic polys  or visual blurring. Patient had failed all attempts for control on oral medications due to her admitted Gluttony with A1c's running in the 9- 13% range.   Last A1c was improved significantly since Dr Cruzita Lederer started her on Insulin And Trulicity: Lab Results  Component Value Date   HGBA1C 6.7 (H) 11/30/2016      Further, the patient also has history of Vitamin D Deficiency ("25" in 2008) and supplements vitamin D without any suspected side-effects. Last vitamin D was at goal:  Lab Results  Component Value Date   VD25OH 50 09/27/2016   Current Outpatient Prescriptions on File Prior to Visit  Medication Sig  . acetaminophen (TYLENOL) 325 MG tablet Take 2 tablets (650 mg total) by mouth every 4 (four) hours as needed for  headache or mild pain.  Marland Kitchen allopurinol (ZYLOPRIM) 100 MG tablet Take 1 tablet (100 mg total) by mouth daily.  Marland Kitchen aspirin EC 81 MG EC tablet Take 1 tablet (81 mg total) by mouth daily.  Marland Kitchen atorvastatin (LIPITOR) 80 MG tablet Take 1 tablet (80 mg total) by mouth daily at 6 PM.  . buPROPion (WELLBUTRIN XL) 300 MG 24 hr tablet TAKE 1 TABLET BY MOUTH DAILY.  Marland Kitchen Cholecalciferol ([employer] VIT D 5000 HIGH-POTENCY) 5000 units capsule Take 5,000 Units by mouth 2 (two) times daily.   . clopidogrel (PLAVIX) 75 MG tablet TAKE 1 TABLET (75 MG TOTAL) BY MOUTH DAILY.  Marland Kitchen colchicine 0.6 MG tablet Take 2 tablets first day of gout flare, then one a day until gout flare is better (Patient taking differently: Take 0.6-1.2 mg by mouth See admin instructions. 1.2 mg on the first day of gout flare, then 0.6 mg once a day until flare is better)  . diclofenac (FLECTOR) 1.3 % PTCH Place 1 patch onto the skin daily as needed (for pain).   . Diphenhydramine-PSE-APAP (BENADRYL COLD PO) Take by mouth.  . furosemide (LASIX) 40 MG tablet Take 40-60 mg by mouth every morning.   . gabapentin (NEURONTIN) 600 MG tablet Take 600 mg by mouth 2 (two) times daily.  Marland Kitchen glucose blood (ONETOUCH VERIO) test strip Use as instructed to check sugar 2 times daily.  Marland Kitchen HYDROcodone-acetaminophen (NORCO/VICODIN)  5-325 MG tablet Take 1 tablet by mouth every 6 (six) hours as needed for moderate pain.  Marland Kitchen insulin aspart (NOVOLOG FLEXPEN) 100 UNIT/ML FlexPen Inject 16-20 Units into the skin 3 (three) times daily with meals. (Patient taking differently: Inject 16-20 Units into the skin 2 (two) times daily before a meal. )  . Insulin Glargine (BASAGLAR KWIKPEN) 100 UNIT/ML SOPN INJECT 70 INTO THE SKIN AT BEDTIME.  . Insulin Pen Needle (CAREFINE PEN NEEDLES) 32G X 4 MM MISC Use 4x a day  . Lancets (ONETOUCH ULTRASOFT) lancets Use as instructed  . losartan (COZAAR) 50 MG tablet TAKE 1 TABLET (50 MG TOTAL) BY MOUTH DAILY.  . nitroGLYCERIN (NITROSTAT) 0.4 MG SL tablet  Place 1 tablet (0.4 mg total) under the tongue every 5 (five) minutes x 3 doses as needed for chest pain.  Marland Kitchen rOPINIRole (REQUIP) 3 MG tablet TAKE 1 TABLET (3 MG TOTAL) BY MOUTH 4 (FOUR) TIMES DAILY.  Marland Kitchen tiZANidine (ZANAFLEX) 4 MG tablet Take 4 mg by mouth every 6 (six) hours as needed for muscle spasms.  . TRULICITY 1.5 OQ/9.4TM SOPN INJECT 1.5 MG WEEKLY   No current facility-administered medications on file prior to visit.    Allergies  Allergen Reactions  . Brilinta [Ticagrelor] Shortness Of Breath and Other (See Comments)    Chest pain (also)  . Aspirin Other (See Comments)    Can tolerate in small doses (is already on a blood thinner AND has kidney disease)  . Nsaids Other (See Comments)    Patient is taking a blood thinner and has kidney disease  . Minocycline Hcl Other (See Comments)    Welts  . Oruvail [Ketoprofen] Other (See Comments)    Has kidney disease and is taking a blood thinner  . Tricor [Fenofibrate]     Reaction unknown  . Vasotec [Enalaprilat]     Reaction unknown  . Zinc     Reaction unknown   PMHx:   Past Medical History:  Diagnosis Date  . Anemia    hx (04/14/2016)  . Anxiety   . Arthritis    "severe in my back; hands; ankles" (04/14/2016)  . Basal cell carcinoma    "several burned off; one cut off"  . CAD in native artery    a. NSTEMI 04/2016 - s/p DES toLAD and LCx. PCI to LCx notable for microembolization during cath.  . Chest pain- reslved with stopping Brilinta now on Plaix 04/16/2016  . Chronic lower back pain   . CKD (chronic kidney disease), stage III   . Depression   . DJD (degenerative joint disease)   . Family history of adverse reaction to anesthesia    "half-sister used to get real sick" (04/14/2016)  . GERD (gastroesophageal reflux disease)   . History of gout   . Hyperlipidemia   . Hypertension   . IBS (irritable bowel syndrome)   . Ischemic cardiomyopathy    a. 04/2016: EF 40-50% by cath, 50-55% +WMA by echo.  . Malignant melanoma  of left side of neck ([place]) ~ 2015  . Morbid obesity ([place])   . NSTEMI (non-ST elevated myocardial infarction) ([place]) 04/14/2016  . OSA on CPAP   . Peripheral neuropathy   . Peripheral vascular disease ([place])   . RLS (restless legs syndrome)   . Spinal stenosis   . Type II diabetes mellitus ([place])   . Vitamin D deficiency    Immunization History  Administered Date(s) Administered  . DT 04/14/2015  . Hepatitis B 07/09/1999, 08/09/1999, 01/09/2000  .  Influenza, High Dose Seasonal PF 03/20/2014  . Influenza,inj,quad, With Preservative 04/26/2016  . Influenza-Unspecified 03/28/2015, 04/14/2015  . Pneumococcal Conjugate-13 03/20/2014  . Pneumococcal Polysaccharide-23 10/15/2009  . Td 03/19/2004   Past Surgical History:  Procedure Laterality Date  . APPENDECTOMY    . BACK SURGERY    . BASAL CELL CARCINOMA EXCISION Left    leg  . CARDIAC CATHETERIZATION N/A 04/15/2016   Procedure: Left Heart Cath and Coronary Angiography;  Surgeon: Belva Crome, MD;  Location: [facility];  Service: Cardiovascular;  Laterality: N/A;  . CARDIAC CATHETERIZATION N/A 04/15/2016   Procedure: Coronary Stent Intervention;  Surgeon: Belva Crome, MD;  Location: [facility];  Service: Cardiovascular;  Laterality: N/A;  Mid LAD Mid CFX  . CATARACT EXTRACTION, BILATERAL Bilateral   . JOINT REPLACEMENT    . LUMBAR DISC SURGERY  X 2  . MELANOMA EXCISION Left    "towards the back of my neck"  . SHOULDER ARTHROSCOPY W/ ROTATOR CUFF REPAIR Left   . SHOULDER OPEN ROTATOR CUFF REPAIR Right   . TOTAL KNEE ARTHROPLASTY Bilateral    FHx:    Reviewed / unchanged  SHx:    Reviewed / unchanged  Systems Review:  Constitutional: Denies fever, chills, wt changes, headaches, insomnia, fatigue, night sweats, change in appetite. Eyes: Denies redness, blurred vision, diplopia, discharge, itchy, watery eyes.  ENT: Denies discharge, congestion, post nasal drip, epistaxis, sore throat, earache, hearing loss, dental  pain, tinnitus, vertigo, sinus pain, snoring.  CV: Denies chest pain, palpitations, irregular heartbeat, syncope, dyspnea, diaphoresis, orthopnea, PND, claudication or edema. Respiratory: denies cough, dyspnea, DOE, pleurisy, hoarseness, laryngitis, wheezing.  Gastrointestinal: Denies dysphagia, odynophagia, heartburn, reflux, water brash, abdominal pain or cramps, nausea, vomiting, bloating, diarrhea, constipation, hematemesis, melena, hematochezia  or hemorrhoids. Genitourinary: Denies dysuria, frequency, urgency, nocturia, hesitancy, discharge, hematuria or flank pain. Musculoskeletal: Denies arthralgias, myalgias, stiffness, jt. swelling, pain, limping or strain/sprain.  Skin: Denies pruritus, rash, hives, warts, acne, eczema or change in skin lesion(s). Neuro: No weakness, tremor, incoordination, spasms, paresthesia or pain. Psychiatric: Denies confusion, memory loss or sensory loss. Endo: Denies change in weight, skin or hair change.  Heme/Lymph: No excessive bleeding, bruising or enlarged lymph nodes.  Physical Exam  BP 116/62   Pulse 84   Temp (!) 97.3 F (36.3 C)   Resp 18   Ht 5' 1.5" (1.562 m)   Wt 227 lb 6.4 oz (103.1 kg)   BMI 42.27 kg/m   Appears well nourished, well groomed  and in no distress.  Eyes: PERRLA, EOMs, conjunctiva no swelling or erythema. Sinuses: No frontal/maxillary tenderness ENT/Mouth: EAC's clear, TM's nl w/o erythema, bulging. Nares clear w/o erythema, swelling, exudates. Oropharynx clear without erythema or exudates. Oral hygiene is good. Tongue normal, non obstructing. Hearing intact.  Neck: Supple. Thyroid nl. Car 2+/2+ without bruits, nodes or JVD. Chest: Respirations nl with BS clear & equal w/o rales, rhonchi, wheezing or stridor.  Cor: Heart sounds normal w/ regular rate and rhythm without sig. murmurs, gallops, clicks or rubs. Peripheral pulses normal and equal  without edema.  Abdomen: Soft & bowel sounds normal. Non-tender w/o guarding,  rebound, hernias, masses or organomegaly.  Lymphatics: Unremarkable.  Musculoskeletal: Full ROM all peripheral extremities, joint stability, 5/5 strength and normal gait.  Skin: Warm, dry without exposed rashes, lesions or ecchymosis apparent.  Neuro: Cranial nerves intact, reflexes equal bilaterally. Sensory-motor testing grossly intact. Tendon reflexes grossly intact.  Pysch: Alert & oriented x 3.  Insight and judgement  nl & appropriate. No ideations.  Assessment and Plan:  1. Essential hypertension  - Continue medication, monitor blood pressure at home.  - Continue DASH diet. Reminder to go to the ER if any CP,  SOB, nausea, dizziness, severe HA, changes vision/speech.  - CBC with Differential/Platelet - BASIC METABOLIC PANEL WITH GFR - Magnesium - TSH  2. Hyperlipidemia, mixed  - Continue diet/meds, exercise,& lifestyle modifications.  - Continue monitor periodic cholesterol/liver & renal functions   - Hepatic function panel - TSH - Lipid panel  3. Insulin-requiring or dependent type II diabetes mellitus ([place])  - Continue diet, exercise, lifestyle modifications.  - Monitor appropriate labs.  4. Vitamin D deficiency  - Continue supplementation.  - VITAMIN D 25 Hydroxy  5. Gout  - Uric acid  6. CAD in native artery   7. NSTEMI (non-ST elevated myocardial infarction) ([place])  - Lipid panel  8. Medication management  - CBC with Differential/Platelet - BASIC METABOLIC PANEL WITH GFR - Hepatic function panel - Magnesium - TSH - VITAMIN D 25 Hydroxy - Uric acid      Discussed  regular exercise, BP monitoring, weight control to achieve/maintain BMI less than 25 and discussed med and SE's. Recommended labs to assess and monitor clinical status with further disposition pending results of labs. Over 30 minutes of exam, counseling, chart review was performed.

## 2016-12-28 NOTE — Patient Instructions (Signed)

## 2016-12-29 LAB — CBC WITH DIFFERENTIAL/PLATELET
BASOS PCT: 0.5 %
Basophils Absolute: 20 cells/uL (ref 0–200)
EOS ABS: 168 {cells}/uL (ref 15–500)
Eosinophils Relative: 4.2 %
HEMATOCRIT: 35.1 % (ref 35.0–45.0)
Hemoglobin: 11.6 g/dL — ABNORMAL LOW (ref 11.7–15.5)
LYMPHS ABS: 1096 {cells}/uL (ref 850–3900)
MCH: 28.7 pg (ref 27.0–33.0)
MCHC: 33 g/dL (ref 32.0–36.0)
MCV: 86.9 fL (ref 80.0–100.0)
MPV: 9.5 fL (ref 7.5–12.5)
Monocytes Relative: 7 %
NEUTROS PCT: 60.9 %
Neutro Abs: 2436 cells/uL (ref 1500–7800)
Platelets: 226 10*3/uL (ref 140–400)
RBC: 4.04 10*6/uL (ref 3.80–5.10)
RDW: 18.6 % — AB (ref 11.0–15.0)
Total Lymphocyte: 27.4 %
WBC: 4 10*3/uL (ref 3.8–10.8)
WBCMIX: 280 {cells}/uL (ref 200–950)

## 2016-12-29 LAB — HEPATIC FUNCTION PANEL
AG RATIO: 1.8 (calc) (ref 1.0–2.5)
ALBUMIN MSPROF: 4.1 g/dL (ref 3.6–5.1)
ALT: 11 U/L (ref 6–29)
AST: 17 U/L (ref 10–35)
Alkaline phosphatase (APISO): 79 U/L (ref 33–130)
BILIRUBIN INDIRECT: 0.4 mg/dL (ref 0.2–1.2)
Bilirubin, Direct: 0.1 mg/dL (ref 0.0–0.2)
GLOBULIN: 2.3 g/dL (ref 1.9–3.7)
TOTAL PROTEIN: 6.4 g/dL (ref 6.1–8.1)
Total Bilirubin: 0.5 mg/dL (ref 0.2–1.2)

## 2016-12-29 LAB — BASIC METABOLIC PANEL WITH GFR
BUN/Creatinine Ratio: 16 (calc) (ref 6–22)
BUN: 23 mg/dL (ref 7–25)
CALCIUM: 9.6 mg/dL (ref 8.6–10.4)
CHLORIDE: 101 mmol/L (ref 98–110)
CO2: 29 mmol/L (ref 20–32)
CREATININE: 1.44 mg/dL — AB (ref 0.60–0.93)
GFR, Est African American: 41 mL/min/{1.73_m2} — ABNORMAL LOW (ref >=60)
GFR, Est Non African American: 35 mL/min/{1.73_m2} — ABNORMAL LOW (ref >=60)
GLUCOSE: 72 mg/dL (ref 65–99)
Potassium: 3.5 mmol/L (ref 3.5–5.3)
Sodium: 141 mmol/L (ref 135–146)

## 2016-12-29 LAB — URIC ACID: URIC ACID, SERUM: 7.8 mg/dL — AB (ref 2.5–7.0)

## 2016-12-29 LAB — MAGNESIUM: MAGNESIUM: 2.1 mg/dL (ref 1.5–2.5)

## 2016-12-29 LAB — LIPID PANEL
CHOLESTEROL: 182 mg/dL (ref <200)
HDL: 39 mg/dL — AB (ref >50)
LDL Cholesterol (Calc): 102 mg/dL (calc) — ABNORMAL HIGH
NON-HDL CHOLESTEROL (CALC): 143 mg/dL — AB (ref <130)
Total CHOL/HDL Ratio: 4.7 (calc) (ref <5.0)
Triglycerides: 289 mg/dL — ABNORMAL HIGH (ref <150)

## 2016-12-29 LAB — VITAMIN D 25 HYDROXY (VIT D DEFICIENCY, FRACTURES): Vit D, 25-Hydroxy: 60 ng/mL (ref 30–100)

## 2016-12-29 LAB — TSH: TSH: 1.44 m[IU]/L (ref 0.40–4.50)

## 2017-01-05 DIAGNOSIS — Z6841 Body Mass Index (BMI) 40.0 and over, adult: Secondary | ICD-10-CM | POA: Diagnosis not present

## 2017-01-05 DIAGNOSIS — D631 Anemia in chronic kidney disease: Secondary | ICD-10-CM | POA: Diagnosis not present

## 2017-01-05 DIAGNOSIS — I739 Peripheral vascular disease, unspecified: Secondary | ICD-10-CM | POA: Diagnosis not present

## 2017-01-05 DIAGNOSIS — I2510 Atherosclerotic heart disease of native coronary artery without angina pectoris: Secondary | ICD-10-CM | POA: Diagnosis not present

## 2017-01-05 DIAGNOSIS — I129 Hypertensive chronic kidney disease with stage 1 through stage 4 chronic kidney disease, or unspecified chronic kidney disease: Secondary | ICD-10-CM | POA: Diagnosis not present

## 2017-01-05 DIAGNOSIS — E1129 Type 2 diabetes mellitus with other diabetic kidney complication: Secondary | ICD-10-CM | POA: Diagnosis not present

## 2017-01-05 DIAGNOSIS — I255 Ischemic cardiomyopathy: Secondary | ICD-10-CM | POA: Diagnosis not present

## 2017-01-05 DIAGNOSIS — R809 Proteinuria, unspecified: Secondary | ICD-10-CM | POA: Diagnosis not present

## 2017-01-05 DIAGNOSIS — N183 Chronic kidney disease, stage 3 (moderate): Secondary | ICD-10-CM | POA: Diagnosis not present

## 2017-01-07 ENCOUNTER — Other Ambulatory Visit: Payer: Self-pay | Admitting: Nephrology

## 2017-01-07 DIAGNOSIS — N183 Chronic kidney disease, stage 3 unspecified: Secondary | ICD-10-CM

## 2017-01-11 ENCOUNTER — Other Ambulatory Visit: Payer: Self-pay | Admitting: Physician Assistant

## 2017-01-13 ENCOUNTER — Other Ambulatory Visit: Payer: Self-pay

## 2017-01-18 ENCOUNTER — Other Ambulatory Visit: Payer: Self-pay

## 2017-01-18 ENCOUNTER — Ambulatory Visit
Admission: RE | Admit: 2017-01-18 | Discharge: 2017-01-18 | Disposition: A | Discharge status: Home / Self Care | Payer: PPO | Source: Ambulatory Visit | Attending: Nephrology | Admitting: Nephrology

## 2017-01-18 DIAGNOSIS — N183 Chronic kidney disease, stage 3 unspecified: Secondary | ICD-10-CM

## 2017-01-20 ENCOUNTER — Encounter: Payer: Self-pay | Admitting: Physician Assistant

## 2017-01-20 ENCOUNTER — Other Ambulatory Visit: Payer: Self-pay | Admitting: *Deleted

## 2017-01-20 MED ORDER — COLCHICINE 0.6 MG PO TABS: ORAL_TABLET | ORAL | 2 refills | Status: DC

## 2017-01-20 MED ORDER — COLCHICINE 0.6 MG PO TABS: ORAL_TABLET | ORAL | 1 refills | Status: DC

## 2017-01-22 ENCOUNTER — Other Ambulatory Visit: Payer: Self-pay | Admitting: Internal Medicine

## 2017-01-25 DIAGNOSIS — M5442 Lumbago with sciatica, left side: Secondary | ICD-10-CM | POA: Diagnosis not present

## 2017-01-25 DIAGNOSIS — M545 Low back pain: Secondary | ICD-10-CM | POA: Diagnosis not present

## 2017-01-25 DIAGNOSIS — M25552 Pain in left hip: Secondary | ICD-10-CM | POA: Diagnosis not present

## 2017-01-26 ENCOUNTER — Telehealth: Payer: Self-pay

## 2017-01-26 ENCOUNTER — Telehealth: Payer: Self-pay | Admitting: Internal Medicine

## 2017-01-26 NOTE — Telephone Encounter (Signed)
Try to increase mealtime insulin (Novolog by at least 4-6 units) until steroid is out of her system - for few days.

## 2017-01-26 NOTE — Telephone Encounter (Signed)
Patient taking prednisone which is manipulating her blood sugar.   BS was 211. Wants to know if she needs to adjust her insulin. Ok to leave detailed message.  Ty,  -LL

## 2017-01-26 NOTE — Telephone Encounter (Signed)
Called patient and advised of MD note of medication changes. Patient understood and had no questions at this time.

## 2017-01-26 NOTE — Telephone Encounter (Signed)
Please advise. Thank you

## 2017-02-06 ENCOUNTER — Other Ambulatory Visit: Payer: Self-pay | Admitting: Internal Medicine

## 2017-02-06 DIAGNOSIS — E1140 Type 2 diabetes mellitus with diabetic neuropathy, unspecified: Secondary | ICD-10-CM

## 2017-02-07 ENCOUNTER — Encounter: Payer: Self-pay | Admitting: Physician Assistant

## 2017-02-07 ENCOUNTER — Ambulatory Visit (INDEPENDENT_AMBULATORY_CARE_PROVIDER_SITE_OTHER): Payer: PPO | Admitting: Physician Assistant

## 2017-02-07 VITALS — BP 120/70 | HR 90 | Temp 98.1°F | Ht 61.5 in

## 2017-02-07 DIAGNOSIS — M7021 Olecranon bursitis, right elbow: Secondary | ICD-10-CM | POA: Diagnosis not present

## 2017-02-07 DIAGNOSIS — R197 Diarrhea, unspecified: Secondary | ICD-10-CM

## 2017-02-07 MED ORDER — LEVOFLOXACIN 500 MG PO TABS: 500.0000 mg | ORAL_TABLET | Freq: Every day | ORAL | 0 refills | Status: DC

## 2017-02-07 NOTE — Progress Notes (Signed)
Subjective:    Patient ID: Janet Choi, female    DOB: 20-Dec-1940, 76 y.o.   MRN: [id number]  HPI 76 y.o. obese WF with history of DM presents with right elbow pain.  Right elbow pain, tender, swollen x yesterday morning, + warmth, has been icing it. No injury other than while on vacation and coloring more often and rested elbow on her table.  Has had diarrhea for 2 weeks, but has gotten better, no pain.   Blood pressure 120/70, pulse 90, temperature 98.1 F (36.7 C), height 5' 1.5" (1.562 m), SpO2 97 %.  Medications Current Outpatient Prescriptions on File Prior to Visit  Medication Sig  . acetaminophen (TYLENOL) 325 MG tablet Take 2 tablets (650 mg total) by mouth every 4 (four) hours as needed for headache or mild pain.  Marland Kitchen allopurinol (ZYLOPRIM) 100 MG tablet TAKE 1 TABLET BY MOUTH EVERY DAY  . aspirin EC 81 MG EC tablet Take 1 tablet (81 mg total) by mouth daily.  Marland Kitchen atorvastatin (LIPITOR) 80 MG tablet Take 1 tablet (80 mg total) by mouth daily at 6 PM.  . buPROPion (WELLBUTRIN XL) 300 MG 24 hr tablet TAKE 1 TABLET BY MOUTH DAILY.  Marland Kitchen Cholecalciferol ([employer]S VIT D 5000 HIGH-POTENCY) 5000 units capsule Take 5,000 Units by mouth 2 (two) times daily.   . clopidogrel (PLAVIX) 75 MG tablet TAKE 1 TABLET (75 MG TOTAL) BY MOUTH DAILY.  Marland Kitchen colchicine 0.6 MG tablet Take 2 tablets first day of gout flare, then one a day until gout flare is better  . diclofenac (FLECTOR) 1.3 % PTCH Place 1 patch onto the skin daily as needed (for pain).   . Diphenhydramine-PSE-APAP (BENADRYL COLD PO) Take by mouth.  . furosemide (LASIX) 40 MG tablet Take 40-60 mg by mouth every morning.   . gabapentin (NEURONTIN) 600 MG tablet TAKE ONE-HALF TO ONE TABLET BY MOUTH THREE TIMES DAILY FOR PAIN.  . glucose blood (ONETOUCH VERIO) test strip Use as instructed to check sugar 2 times daily.  Marland Kitchen HYDROcodone-acetaminophen (NORCO/VICODIN) 5-325 MG tablet Take 1 tablet by mouth every 6 (six) hours as needed for moderate pain.   Marland Kitchen insulin aspart (NOVOLOG FLEXPEN) 100 UNIT/ML FlexPen Inject 16-20 Units into the skin 3 (three) times daily with meals. (Patient taking differently: Inject 16-20 Units into the skin 2 (two) times daily before a meal. )  . Insulin Glargine (BASAGLAR KWIKPEN) 100 UNIT/ML SOPN INJECT 70 UNITS INTO THE SKIN AT BEDTIME.  . Insulin Pen Needle (CAREFINE PEN NEEDLES) 32G X 4 MM MISC Use 4x a day  . Lancets (ONETOUCH ULTRASOFT) lancets Use as instructed  . losartan (COZAAR) 50 MG tablet TAKE 1 TABLET (50 MG TOTAL) BY MOUTH DAILY.  . nitroGLYCERIN (NITROSTAT) 0.4 MG SL tablet Place 1 tablet (0.4 mg total) under the tongue every 5 (five) minutes x 3 doses as needed for chest pain.  Marland Kitchen PROAIR HFA 108 (90 Base) MCG/ACT inhaler INHALE 2 PUFFS INTO THE LUNGS EVERY 4 (FOUR) HOURS AS NEEDED FOR WHEEZING OR SHORTNESS OF BREATH.  Marland Kitchen rOPINIRole (REQUIP) 3 MG tablet TAKE 1 TABLET (3 MG TOTAL) BY MOUTH 4 (FOUR) TIMES DAILY.  Marland Kitchen tiZANidine (ZANAFLEX) 4 MG tablet Take 4 mg by mouth every 6 (six) hours as needed for muscle spasms.  . TRULICITY 1.5 PO/2.4MP SOPN INJECT 1.5 MG WEEKLY   No current facility-administered medications on file prior to visit.     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); Hyperlipidemia; Essential hypertension; Diverticulosis  of large intestine; History of colonic polyps; Morbid obesity; Vitamin D deficiency; Medication management; RLS (restless legs syndrome); Uncontrolled type 2 diabetes mellitus with stage 3 chronic kidney disease, with long-term current use of insulin ([place]); BMI 38.0-38.9,adult; Medicare annual wellness visit, subsequent; OSA on CPAP; Iron deficiency anemia; NSTEMI (non-ST elevated myocardial infarction) ([place]); CKD (chronic kidney disease), stage III ([place]); Back pain; Hypertensive heart disease without heart failure; S/P coronary artery stent placement; CAD in native artery; Anxiety; and Gluttony on her problem list.'   Review of Systems  Constitutional: Positive  for fatigue. Negative for chills, diaphoresis and fever.  HENT: Negative.   Respiratory: Negative.  Negative for cough.   Cardiovascular: Negative.   Gastrointestinal: Positive for diarrhea. Negative for abdominal distention, abdominal pain, anal bleeding, blood in stool, constipation, nausea, rectal pain and vomiting.  Genitourinary: Negative.   Musculoskeletal: Positive for arthralgias and joint swelling. Negative for back pain, gait problem, myalgias, neck pain and neck stiffness.  Skin: Negative.   Neurological: Negative for dizziness and headaches.       Objective:   Physical Exam  Constitutional: She is oriented to person, place, and time. She appears well-developed and well-nourished.  Neck: Normal range of motion. Neck supple.  Cardiovascular: Normal rate and regular rhythm.   Pulmonary/Chest: Effort normal and breath sounds normal.  Abdominal: Soft. Bowel sounds are normal. She exhibits no mass. There is tenderness in the left lower quadrant. There is no rigidity, no rebound, no guarding, no CVA tenderness, no tenderness at McBurney's point and negative Murphy's sign.  Musculoskeletal:  Right elbow with swelling at the olecranon bursa with redness, swelling, warmth at elbow and some down her arm to mid forearm. No obvious wound. Hand without swelling, full ROM and good distal neurovascular.   Neurological: She is alert and oriented to person, place, and time.  Skin: Skin is warm and dry. No rash noted.  Vitals reviewed.       Assessment & Plan:    Diarrhea, unspecified type ? Diverticulitis, bland diet, liquids, ABx, go to ER if worsening symptoms -     CBC with Differential/Platelet -     BASIC METABOLIC PANEL WITH GFR -     Hepatic function panel -     levofloxacin (LEVAQUIN) 500 MG tablet; Take 1 tablet (500 mg total) by mouth daily.  Olecranon bursitis of right elbow Follow up Dr. Berenice Primas, will check for gout, check CBC, start levaquin, avoid pressure on elbow and  ice it -     CBC with Differential/Platelet -     Uric acid -     levofloxacin (LEVAQUIN) 500 MG tablet; Take 1 tablet (500 mg total) by mouth daily.   The patient was advised to call immediately if she has any concerning symptoms in the interval. The patient voices understanding of current treatment options and is in agreement with the current care plan.The patient knows to call the clinic with any problems, questions or concerns or go to the ER if any further progression of symptoms.   Future Appointments Date Time Provider [place]  03/02/2017 11:00 AM Philemon Kingdom, MD [id number] None  04/26/2017 3:45 PM Vicie Mutters, PA-C [id number] None

## 2017-02-07 NOTE — Patient Instructions (Addendum)
Follow up with ortho for your elbow Likely an olecranon bursitis  Take the levaquin for possible diverticulitis and your elbow Ice your elbow Do bland diet/liquid diet for 1-2 weeks FOLLOW UP WITH ORTHO  Elbow Bursitis Elbow bursitis is inflammation of the fluid-filled sac (bursa) between the tip of your elbow bone (olecranon) and your skin. Elbow bursitis may also be called olecranon bursitis. Normally, the olecranon bursa has only a small amount of fluid in it to cushion and protect your elbow bone. Elbow bursitis causes fluid to build up inside the bursa. Over time, this swelling and inflammation can cause pain when you bend or lean on your elbow. What are the causes? Elbow bursitis may be caused by:  Elbow injury (acute trauma).  Leaning on hard surfaces for long periods of time.  Infection from an injury that breaks the skin near your elbow.  A bone growth (spur) that forms at the tip of your elbow.  A medical condition that causes inflammation in your body, such as gout or rheumatoid arthritis.  The cause may also be unknown. What are the signs or symptoms? The first sign of elbow bursitis is usually swelling over the tip of your elbow. This can grow to be the size of a golf ball. This may start suddenly or develop gradually. You may also have:  Pain when bending or leaning on your elbow.  Restricted movement of your elbow.  If your bursitis is caused by an infection, symptoms may also include:  Redness, warmth, and tenderness of the elbow.  Drainage of pus from the swollen area over your elbow, if the skin breaks open.  How is this diagnosed? Your health care provider may be able to diagnose elbow bursitis based on your signs and symptoms, especially if you have recently been injured. Your health care provider will also do a physical exam. This may include:  X-rays to look for a bone spur or a bone fracture.  Draining fluid from the bursa to test it for  infection.  Blood tests to rule out gout or rheumatoid arthritis.  How is this treated? Treatment for elbow bursitis depends on the cause. Treatment may include:  Medicines. These may include: ? Over-the-counter medicines to relieve pain and inflammation. ? Antibiotic medicines to fight infection. ? Injections of anti-inflammatory medicines (steroids).  Wrapping your elbow with a bandage.  Draining fluid from the bursa.  Wearing elbow pads.  If your bursitis does not get better with treatment, surgery may be needed to remove the bursa. Follow these instructions at home:  Take medicines only as directed by your health care provider.  If you were prescribed an antibiotic medicine, finish all of it even if you start to feel better.  If your bursitis is caused by an injury, rest your elbow and wear your bandage as directed by your health care provider. You may alsoapply ice to the injured area as directed by your health care provider: ? Put ice in a plastic bag. ? Place a towel between your skin and the bag. ? Leave the ice on for 20 minutes, 2-3 times per day.  Avoid any activities that cause elbow pain.  Use elbow pads or elbow wraps to cushion your elbow. Contact a health care provider if:  You have a fever.  Your symptoms do not get better with treatment.  Your pain or swelling gets worse.  Your elbow pain or swelling goes away and then returns.  You have drainage of pus from the  swollen area over your elbow. This information is not intended to replace advice given to you by your health care provider. Make sure you discuss any questions you have with your health care provider. Document Released: 05/26/2006 Document Revised: 10/02/2015 Document Reviewed: 01/02/2014 Elsevier Interactive Patient Education  2018 [employer].    Diverticulitis Diverticulitis is inflammation or infection of small pouches in your colon that form when you have a condition called  diverticulosis. The pouches in your colon are called diverticula. Your colon, or large intestine, is where water is absorbed and stool is formed. Complications of diverticulitis can include:  Bleeding.  Severe infection.  Severe pain.  Perforation of your colon.  Obstruction of your colon.  What are the causes? Diverticulitis is caused by bacteria. Diverticulitis happens when stool becomes trapped in diverticula. This allows bacteria to grow in the diverticula, which can lead to inflammation and infection. What increases the risk? People with diverticulosis are at risk for diverticulitis. Eating a diet that does not include enough fiber from fruits and vegetables may make diverticulitis more likely to develop. What are the signs or symptoms? Symptoms of diverticulitis may include:  Abdominal pain and tenderness. The pain is normally located on the left side of the abdomen, but may occur in other areas.  Fever and chills.  Bloating.  Cramping.  Nausea.  Vomiting.  Constipation.  Diarrhea.  Blood in your stool.  How is this diagnosed? Your health care provider will ask you about your medical history and do a physical exam. You may need to have tests done because many medical conditions can cause the same symptoms as diverticulitis. Tests may include:  Blood tests.  Urine tests.  Imaging tests of the abdomen, including X-rays and CT scans.  When your condition is under control, your health care provider may recommend that you have a colonoscopy. A colonoscopy can show how severe your diverticula are and whether something else is causing your symptoms. How is this treated? Most cases of diverticulitis are mild and can be treated at home. Treatment may include:  Taking over-the-counter pain medicines.  Following a clear liquid diet.  Taking antibiotic medicines by mouth for 7-10 days.  More severe cases may be treated at a hospital. Treatment may include:  Not  eating or drinking.  Taking prescription pain medicine.  Receiving antibiotic medicines through an IV tube.  Receiving fluids and nutrition through an IV tube.  Surgery.  Follow these instructions at home:  Follow your health care provider's instructions carefully.  Follow a full liquid diet or other diet as directed by your health care provider. After your symptoms improve, your health care provider may tell you to change your diet. He or she may recommend you eat a high-fiber diet. Fruits and vegetables are good sources of fiber. Fiber makes it easier to pass stool.  Take fiber supplements or probiotics as directed by your health care provider.  Only take medicines as directed by your health care provider.  Keep all your follow-up appointments. Contact a health care provider if:  Your pain does not improve.  You have a hard time eating food.  Your bowel movements do not return to normal. Get help right away if:  Your pain becomes worse.  Your symptoms do not get better.  Your symptoms suddenly get worse.  You have a fever.  You have repeated vomiting.  You have bloody or black, tarry stools. This information is not intended to replace advice given to you by your  health care provider. Make sure you discuss any questions you have with your health care provider. Document Released: 02/03/2005 Document Revised: 10/02/2015 Document Reviewed: 03/21/2013 Elsevier Interactive Patient Education  2017 [employer].

## 2017-02-08 DIAGNOSIS — M9904 Segmental and somatic dysfunction of sacral region: Secondary | ICD-10-CM | POA: Diagnosis not present

## 2017-02-08 DIAGNOSIS — M9905 Segmental and somatic dysfunction of pelvic region: Secondary | ICD-10-CM | POA: Diagnosis not present

## 2017-02-08 DIAGNOSIS — M9903 Segmental and somatic dysfunction of lumbar region: Secondary | ICD-10-CM | POA: Diagnosis not present

## 2017-02-08 DIAGNOSIS — M5136 Other intervertebral disc degeneration, lumbar region: Secondary | ICD-10-CM | POA: Diagnosis not present

## 2017-02-08 LAB — HEPATIC FUNCTION PANEL
AG Ratio: 1.7 (calc) (ref 1.0–2.5)
ALT: 10 U/L (ref 6–29)
AST: 9 U/L — ABNORMAL LOW (ref 10–35)
Albumin: 3.6 g/dL (ref 3.6–5.1)
Alkaline phosphatase (APISO): 69 U/L (ref 33–130)
BILIRUBIN DIRECT: 0.1 mg/dL (ref 0.0–0.2)
BILIRUBIN INDIRECT: 0.3 mg/dL (ref 0.2–1.2)
GLOBULIN: 2.1 g/dL (ref 1.9–3.7)
Total Bilirubin: 0.4 mg/dL (ref 0.2–1.2)
Total Protein: 5.7 g/dL — ABNORMAL LOW (ref 6.1–8.1)

## 2017-02-08 LAB — CBC WITH DIFFERENTIAL/PLATELET
BASOS PCT: 0.3 %
Basophils Absolute: 21 cells/uL (ref 0–200)
EOS PCT: 1.4 %
Eosinophils Absolute: 98 cells/uL (ref 15–500)
HCT: 33.1 % — ABNORMAL LOW (ref 35.0–45.0)
Hemoglobin: 11 g/dL — ABNORMAL LOW (ref 11.7–15.5)
Lymphs Abs: 861 cells/uL (ref 850–3900)
MCH: 29.6 pg (ref 27.0–33.0)
MCHC: 33.2 g/dL (ref 32.0–36.0)
MCV: 89.2 fL (ref 80.0–100.0)
MPV: 10.5 fL (ref 7.5–12.5)
Monocytes Relative: 5 %
NEUTROS PCT: 81 %
Neutro Abs: 5670 cells/uL (ref 1500–7800)
PLATELETS: 197 10*3/uL (ref 140–400)
RBC: 3.71 10*6/uL — AB (ref 3.80–5.10)
RDW: 14.4 % (ref 11.0–15.0)
TOTAL LYMPHOCYTE: 12.3 %
WBC: 7 10*3/uL (ref 3.8–10.8)
WBCMIX: 350 {cells}/uL (ref 200–950)

## 2017-02-08 LAB — BASIC METABOLIC PANEL WITH GFR
BUN / CREAT RATIO: 14 (calc) (ref 6–22)
BUN: 15 mg/dL (ref 7–25)
CO2: 26 mmol/L (ref 20–32)
CREATININE: 1.08 mg/dL — AB (ref 0.60–0.93)
Calcium: 8.4 mg/dL — ABNORMAL LOW (ref 8.6–10.4)
Chloride: 104 mmol/L (ref 98–110)
GFR, Est African American: 58 mL/min/{1.73_m2} — ABNORMAL LOW (ref >=60)
GFR, Est Non African American: 50 mL/min/{1.73_m2} — ABNORMAL LOW (ref >=60)
Glucose, Bld: 220 mg/dL — ABNORMAL HIGH (ref 65–99)
Potassium: 4.4 mmol/L (ref 3.5–5.3)
SODIUM: 137 mmol/L (ref 135–146)

## 2017-02-08 LAB — URIC ACID: URIC ACID, SERUM: 3.9 mg/dL (ref 2.5–7.0)

## 2017-02-09 DIAGNOSIS — M9904 Segmental and somatic dysfunction of sacral region: Secondary | ICD-10-CM | POA: Diagnosis not present

## 2017-02-09 DIAGNOSIS — M9903 Segmental and somatic dysfunction of lumbar region: Secondary | ICD-10-CM | POA: Diagnosis not present

## 2017-02-09 DIAGNOSIS — M9905 Segmental and somatic dysfunction of pelvic region: Secondary | ICD-10-CM | POA: Diagnosis not present

## 2017-02-09 DIAGNOSIS — M5136 Other intervertebral disc degeneration, lumbar region: Secondary | ICD-10-CM | POA: Diagnosis not present

## 2017-02-10 ENCOUNTER — Other Ambulatory Visit: Payer: Self-pay

## 2017-02-10 DIAGNOSIS — M9904 Segmental and somatic dysfunction of sacral region: Secondary | ICD-10-CM | POA: Diagnosis not present

## 2017-02-10 DIAGNOSIS — M5136 Other intervertebral disc degeneration, lumbar region: Secondary | ICD-10-CM | POA: Diagnosis not present

## 2017-02-10 DIAGNOSIS — M9905 Segmental and somatic dysfunction of pelvic region: Secondary | ICD-10-CM | POA: Diagnosis not present

## 2017-02-10 DIAGNOSIS — M9903 Segmental and somatic dysfunction of lumbar region: Secondary | ICD-10-CM | POA: Diagnosis not present

## 2017-02-10 MED ORDER — ALLOPURINOL 100 MG PO TABS
100.0000 mg | ORAL_TABLET | Freq: Every day | ORAL | 0 refills | Status: DC
Start: 2017-02-10 — End: 2017-02-11

## 2017-02-11 ENCOUNTER — Other Ambulatory Visit: Payer: Self-pay | Admitting: Internal Medicine

## 2017-02-11 MED ORDER — ALLOPURINOL 100 MG PO TABS: ORAL_TABLET | ORAL | 3 refills | Status: DC

## 2017-02-14 DIAGNOSIS — M9904 Segmental and somatic dysfunction of sacral region: Secondary | ICD-10-CM | POA: Diagnosis not present

## 2017-02-14 DIAGNOSIS — M9903 Segmental and somatic dysfunction of lumbar region: Secondary | ICD-10-CM | POA: Diagnosis not present

## 2017-02-14 DIAGNOSIS — M5136 Other intervertebral disc degeneration, lumbar region: Secondary | ICD-10-CM | POA: Diagnosis not present

## 2017-02-14 DIAGNOSIS — M9905 Segmental and somatic dysfunction of pelvic region: Secondary | ICD-10-CM | POA: Diagnosis not present

## 2017-02-15 DIAGNOSIS — M9904 Segmental and somatic dysfunction of sacral region: Secondary | ICD-10-CM | POA: Diagnosis not present

## 2017-02-15 DIAGNOSIS — M9905 Segmental and somatic dysfunction of pelvic region: Secondary | ICD-10-CM | POA: Diagnosis not present

## 2017-02-15 DIAGNOSIS — M5136 Other intervertebral disc degeneration, lumbar region: Secondary | ICD-10-CM | POA: Diagnosis not present

## 2017-02-15 DIAGNOSIS — M9903 Segmental and somatic dysfunction of lumbar region: Secondary | ICD-10-CM | POA: Diagnosis not present

## 2017-02-21 DIAGNOSIS — H52201 Unspecified astigmatism, right eye: Secondary | ICD-10-CM | POA: Diagnosis not present

## 2017-02-21 DIAGNOSIS — H26491 Other secondary cataract, right eye: Secondary | ICD-10-CM | POA: Diagnosis not present

## 2017-02-21 DIAGNOSIS — H43813 Vitreous degeneration, bilateral: Secondary | ICD-10-CM | POA: Diagnosis not present

## 2017-02-21 DIAGNOSIS — M9905 Segmental and somatic dysfunction of pelvic region: Secondary | ICD-10-CM | POA: Diagnosis not present

## 2017-02-21 DIAGNOSIS — M9904 Segmental and somatic dysfunction of sacral region: Secondary | ICD-10-CM | POA: Diagnosis not present

## 2017-02-21 DIAGNOSIS — M5136 Other intervertebral disc degeneration, lumbar region: Secondary | ICD-10-CM | POA: Diagnosis not present

## 2017-02-21 DIAGNOSIS — E119 Type 2 diabetes mellitus without complications: Secondary | ICD-10-CM | POA: Diagnosis not present

## 2017-02-21 DIAGNOSIS — M9903 Segmental and somatic dysfunction of lumbar region: Secondary | ICD-10-CM | POA: Diagnosis not present

## 2017-02-21 LAB — HM DIABETES EYE EXAM

## 2017-02-28 ENCOUNTER — Encounter: Payer: Self-pay | Admitting: *Deleted

## 2017-03-02 ENCOUNTER — Encounter: Payer: Self-pay | Admitting: Physician Assistant

## 2017-03-02 ENCOUNTER — Encounter: Payer: Self-pay | Admitting: Internal Medicine

## 2017-03-02 ENCOUNTER — Ambulatory Visit (INDEPENDENT_AMBULATORY_CARE_PROVIDER_SITE_OTHER): Payer: PPO | Admitting: Internal Medicine

## 2017-03-02 VITALS — BP 96/68 | HR 60 | Wt 231.0 lb

## 2017-03-02 DIAGNOSIS — Z794 Long term (current) use of insulin: Secondary | ICD-10-CM

## 2017-03-02 DIAGNOSIS — E1165 Type 2 diabetes mellitus with hyperglycemia: Secondary | ICD-10-CM

## 2017-03-02 DIAGNOSIS — N183 Chronic kidney disease, stage 3 (moderate): Secondary | ICD-10-CM | POA: Diagnosis not present

## 2017-03-02 DIAGNOSIS — E1122 Type 2 diabetes mellitus with diabetic chronic kidney disease: Secondary | ICD-10-CM

## 2017-03-02 DIAGNOSIS — IMO0002 Reserved for concepts with insufficient information to code with codable children: Secondary | ICD-10-CM

## 2017-03-02 LAB — POCT GLYCOSYLATED HEMOGLOBIN (HGB A1C): HEMOGLOBIN A1C: 6.3

## 2017-03-02 NOTE — Patient Instructions (Addendum)
Please continue: - Basaglar 70 units after dinner - if you are sick and cannot eat, you still need to take this, but maybe a lower dose, 50 units  Please continue: - Novolog:  - 16 units before a smaller meal  - 20 units before a larger meal - Trulicity 1.5 mg weekly  Please return in 3 months with your sugar log.

## 2017-03-02 NOTE — Progress Notes (Signed)
Patient ID: Janet Choi, female   DOB: 12/14/1940, 76 y.o.   MRN: [id number]\  HPI: Janet Choi is a 76 y.o.-year-old femalele, returning for f/u for DM2, dx in ~2009, insulin-dependent since 10/2015, uncontrolled, with complications (CAD - s/p NSTEMI, CKD, PN). She is here with her sister who offers part of the hx. Last visit 3 mo ago. She has HTA and [employer].  She had a steroid inj and a Prednisone taper since last visit.  She has more upset stomach - diarrhea.  Last hemoglobin A1c was: Lab Results  Component Value Date   HGBA1C 6.7 (H) 11/30/2016   HGBA1C 7.4 08/11/2016   HGBA1C 10.3 (H) 04/14/2016   Pt was on a regimen of 70/30 insulin * 40 units before b'fast * 55 units before dinner tonight and if no lows >> 60 units before dinner Previously: Metformin and Glimepiride. She was on Victoza.  Now on: - Lantus >> Basaglar 70 units at bedtime - Novolog  - B and D (rarely eats lunch)  - 16 units before a smaller meal  - 20 units before a larger meal - Trulicity 1.5 mg weekly >> She does complain of gas, flatulence. However, she definitely would like to continue with this.  Pt checks her sugars 1-2x a day: - am: 116-287 >> 92-192, 201>> 104-156 >> 128-202 - 2h after b'fast: n/c >> 280,  327 >> n/c >> 127-149, 308 - before lunch: n/c >> 86, 105-173, 211 (Prednisone) >> n/c - 2h after lunch: 237 >> n/c >> 207-541 >> 83-211 >> n/c >>  - before dinner:242-396 >> 105-186, 219 >> 103-179 (snack) >> 60, 139-167, 204 - 2h after dinner: n/c >> 155-230 >> 177 >> 131, 134 - bedtime: n/c >> 199-454, 546 >> 90, 107-219, 256 >> 140 >> 85-171, 215 - nighttime: n/c Lowest sugar was109 >> 116 >> 83 >> 104 >> 60; ? hypoglycemia awareness. Highest 546 >> 310 x1 >> 224 when sick >> Lately 179 >> 308  Glucometer: One Touch Ultra  Pt's meals are: - Breakfast: bagel or 2 pieces toast; coffee + sugar + powdered creamer, sometimes eggs (once a week) - Lunch: 1/2 PB sandwich or goes out - shares a  plate with her sister - Dinner: mac and cheese + veggies; chicken + veggies + some starch - Snacks: after dinner (dinner is at 6 pm, she goes to bed at 1 -2 am) - popcorn, peanuts, sunflower seeds, candy, cookies  - + CKD, last BUN/creatinine worse: Lab Results  Component Value Date   BUN 15 02/07/2017   BUN 23 12/28/2016   CREATININE 1.08 (H) 02/07/2017   CREATININE 1.44 (H) 12/28/2016  On Losartan.  - + HL; last set of lipids: Lab Results  Component Value Date   CHOL 182 12/28/2016   HDL 39 (L) 12/28/2016   LDLCALC 73 09/27/2016   TRIG 289 (H) 12/28/2016   CHOLHDL 4.7 12/28/2016  On Lipitor.  - last eye exam was on 02/2017 >> No DR. + Glaucoma. Dr. Satira Sark. - + numbness and tingling in her feet. Has a podiatrist (Dr. Bjorn Loser).  She had an NSTEMI 04/14/2016 >> 2 stents. She was in ED for CP again 04/16/2016 and 04/28/2016. She also had a kidney stone.  She also has a history of HL, anxiety/depression. She stopped Xanax and started Wellbutrin.  ROS: Constitutional: no weight gain/no weight loss, no fatigue, no subjective hyperthermia, no subjective hypothermia Eyes: no blurry vision, no xerophthalmia ENT: no sore throat, no nodules palpated  in throat, no dysphagia, no odynophagia, no hoarseness Cardiovascular: no CP/no SOB/no palpitations/no leg swelling Respiratory: no cough/no SOB/no wheezing Gastrointestinal: no N/no V/+ D/no C/no acid reflux Musculoskeletal: + muscle aches/+ joint aches Skin: no rashes, no hair loss Neurological: no tremors/+ numbness/+ tingling/no dizziness  I reviewed pt's medications, allergies, PMH, social hx, family hx, and changes were documented in the history of present illness. Otherwise, unchanged from my initial visit note.  Past Medical History:  Diagnosis Date  . Anemia    hx (04/14/2016)  . Anxiety   . Arthritis    "severe in my back; hands; ankles" (04/14/2016)  . Basal cell carcinoma    "several burned off; one cut off"  . CAD in  native artery    a. NSTEMI 04/2016 - s/p DES toLAD and LCx. PCI to LCx notable for microembolization during cath.  . Chest pain- reslved with stopping Brilinta now on Plaix 04/16/2016  . Chronic lower back pain   . CKD (chronic kidney disease), stage III ([place])   . Depression   . DJD (degenerative joint disease)   . Family history of adverse reaction to anesthesia    "half-sister used to get real sick" (04/14/2016)  . GERD (gastroesophageal reflux disease)   . History of gout   . Hyperlipidemia   . Hypertension   . IBS (irritable bowel syndrome)   . Ischemic cardiomyopathy    a. 04/2016: EF 40-50% by cath, 50-55% +WMA by echo.  . 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 ([place]) ~ 2015  . Morbid obesity ([place])   . NSTEMI (non-ST elevated myocardial infarction) ([place]) 04/14/2016  . OSA on CPAP   . Peripheral neuropathy   . Peripheral vascular disease ([place])   . RLS (restless legs syndrome)   . Spinal stenosis   . Type II diabetes mellitus ([place])   . Vitamin D deficiency    Past Surgical History:  Procedure Laterality Date  . APPENDECTOMY    . BACK SURGERY    . BASAL CELL CARCINOMA EXCISION Left    leg  . CARDIAC CATHETERIZATION N/A 04/15/2016   Procedure: Left Heart Cath and Coronary Angiography;  Surgeon: Belva Crome, MD;  Location: [place] [facility];  Service: Cardiovascular;  Laterality: N/A;  . CARDIAC CATHETERIZATION N/A 04/15/2016   Procedure: Coronary Stent Intervention;  Surgeon: Belva Crome, MD;  Location: [place] [facility];  Service: Cardiovascular;  Laterality: N/A;  Mid LAD Mid CFX  . CATARACT EXTRACTION, BILATERAL Bilateral   . JOINT REPLACEMENT    . LUMBAR DISC SURGERY  X 2  . MELANOMA EXCISION Left    "towards the back of my neck"  . SHOULDER ARTHROSCOPY W/ ROTATOR CUFF REPAIR Left   . SHOULDER OPEN ROTATOR CUFF REPAIR Right   . TOTAL KNEE ARTHROPLASTY Bilateral    Social History   Social History  . Marital status: Single    Spouse name: N/A  . Number of  children: 1   Occupational History  . Retired, works part-time, 16 hours a week    Social History Main Topics  . Smoking status: Former Smoker    Quit date: 05/11/1983  . Smokeless tobacco: Never Used  . Alcohol use No  . Drug use: No   Current Outpatient Prescriptions on File Prior to Visit  Medication Sig Dispense Refill  . acetaminophen (TYLENOL) 325 MG tablet Take 2 tablets (650 mg total) by mouth every 4 (four) hours as needed for headache or mild pain.    Marland Kitchen  allopurinol (ZYLOPRIM) 100 MG tablet Take 1 tablet daily to prevent Gout 90 tablet 3  . aspirin EC 81 MG EC tablet Take 1 tablet (81 mg total) by mouth daily.    Marland Kitchen atorvastatin (LIPITOR) 80 MG tablet Take 1 tablet (80 mg total) by mouth daily at 6 PM. 90 tablet 3  . buPROPion (WELLBUTRIN XL) 300 MG 24 hr tablet TAKE 1 TABLET BY MOUTH DAILY. 90 tablet 1  . Cholecalciferol ([employer] VIT D 5000 HIGH-POTENCY) 5000 units capsule Take 5,000 Units by mouth 2 (two) times daily.     . clopidogrel (PLAVIX) 75 MG tablet TAKE 1 TABLET (75 MG TOTAL) BY MOUTH DAILY. 30 tablet 6  . colchicine 0.6 MG tablet Take 2 tablets first day of gout flare, then one a day until gout flare is better 90 tablet 1  . diclofenac (FLECTOR) 1.3 % PTCH Place 1 patch onto the skin daily as needed (for pain).     . Diphenhydramine-PSE-APAP (BENADRYL COLD PO) Take by mouth.    . furosemide (LASIX) 40 MG tablet Take 40-60 mg by mouth every morning.     . gabapentin (NEURONTIN) 600 MG tablet TAKE ONE-HALF TO ONE TABLET BY MOUTH THREE TIMES DAILY FOR PAIN. 270 tablet 1  . glucose blood (ONETOUCH VERIO) test strip Use as instructed to check sugar 2 times daily. 200 each 5  . HYDROcodone-acetaminophen (NORCO/VICODIN) 5-325 MG tablet Take 1 tablet by mouth every 6 (six) hours as needed for moderate pain.    Marland Kitchen insulin aspart (NOVOLOG FLEXPEN) 100 UNIT/ML FlexPen Inject 16-20 Units into the skin 3 (three) times daily with meals. (Patient taking differently: Inject 16-20 Units into  the skin 2 (two) times daily before a meal. ) 10 pen 11  . Insulin Glargine (BASAGLAR KWIKPEN) 100 UNIT/ML SOPN INJECT 70 UNITS INTO THE SKIN AT BEDTIME. 75 pen 0  . Insulin Pen Needle (CAREFINE PEN NEEDLES) 32G X 4 MM MISC Use 4x a day 300 each 11  . Lancets (ONETOUCH ULTRASOFT) lancets Use as instructed 100 each 12  . levofloxacin (LEVAQUIN) 500 MG tablet Take 1 tablet (500 mg total) by mouth daily. 10 tablet 0  . losartan (COZAAR) 50 MG tablet TAKE 1 TABLET (50 MG TOTAL) BY MOUTH DAILY. 90 tablet 1  . nitroGLYCERIN (NITROSTAT) 0.4 MG SL tablet Place 1 tablet (0.4 mg total) under the tongue every 5 (five) minutes x 3 doses as needed for chest pain. 25 tablet 4  . PROAIR HFA 108 (90 Base) MCG/ACT inhaler INHALE 2 PUFFS INTO THE LUNGS EVERY 4 (FOUR) HOURS AS NEEDED FOR WHEEZING OR SHORTNESS OF BREATH.  0  . rOPINIRole (REQUIP) 3 MG tablet TAKE 1 TABLET (3 MG TOTAL) BY MOUTH 4 (FOUR) TIMES DAILY. 360 tablet 1  . tiZANidine (ZANAFLEX) 4 MG tablet Take 4 mg by mouth every 6 (six) hours as needed for muscle spasms.    . TRULICITY 1.5 EX/5.2WU SOPN INJECT 1.5 MG WEEKLY 2 pen 2   No current facility-administered medications on file prior to visit.    Allergies  Allergen Reactions  . Brilinta [Ticagrelor] Shortness Of Breath and Other (See Comments)    Chest pain (also)  . Aspirin Other (See Comments)    Can tolerate in small doses (is already on a blood thinner AND has kidney disease)  . Nsaids Other (See Comments)    Patient is taking a blood thinner and has kidney disease  . Minocycline Hcl Other (See Comments)    Welts  . Oruvail [  Ketoprofen] Other (See Comments)    Has kidney disease and is taking a blood thinner  . Tricor [Fenofibrate]     Reaction unknown  . Vasotec [Enalaprilat]     Reaction unknown  . Zinc     Reaction unknown   Family History  Problem Relation Age of Onset  . Heart disease Mother   . Kidney disease Father   . AAA (abdominal aortic aneurysm) Father   . Heart  disease Son   . Colon cancer Neg Hx   . Colon polyps Neg Hx   . Esophageal cancer Neg Hx   . Pancreatic cancer Neg Hx   . Stomach cancer Neg Hx   . Liver disease Neg Hx   . Diabetes Neg Hx    PE: BP 96/68 (BP Location: Left Arm, Patient Position: Sitting, Cuff Size: Normal)   Pulse 60   Wt 231 lb (104.8 kg)   SpO2 96%   BMI 42.94 kg/m  Wt Readings from Last 3 Encounters:  03/02/17 231 lb (104.8 kg)  12/28/16 227 lb 6.4 oz (103.1 kg)  11/30/16 231 lb (104.8 kg)   Constitutional: obese, in NAD Eyes: PERRLA, EOMI, no exophthalmos ENT: moist mucous membranes, no thyromegaly, no cervical lymphadenopathy Cardiovascular: RRR, No MRG, + mild pitting LE edema Respiratory: CTA B Gastrointestinal: abdomen soft, NT, ND, BS+ Musculoskeletal: no deformities, strength intact in all 4 Skin: moist, warm, no rashes Neurological: no tremor with outstretched hands, DTR normal in all 4  ASSESSMENT: 1. DM2, insulin-dependent, uncontrolled, with complications - CAD - s/p NSTEMI - PN - CKD  2. CKD  PLAN:  1. Patient with Long-standing, uncontrolled DM, with more fluctuating sugars since last visit 2/2 steroids + GI problems. She does not feel her diarrhea is related to Trulicity (would like to continue). She has missed her Basaglar when she felt poorly and we discussed that she needs to take it, but possibly a lower dose - we reviewed her sugar log and I pointed towards higher sugars in am when she did not take the basal insulin at night. - OTW, will continue current regimen for now - I suggested to:  Patient Instructions  Please continue: - Basaglar 70 units after dinner - if you are sick and cannot eat, you still need to take this, but maybe a lower dose, 50 units  Please continue: - Novolog:  - 16 units before a smaller meal  - 20 units before a larger meal - Trulicity 1.5 mg weekly  Please return in 3 months with your sugar log.    - today, HbA1c is 6.3% (better) - continue  checking sugars at different times of the day - check 3x a day, rotating checks - advised for yearly eye exams >> she is UTD - Return to clinic in 3 mo with sugar log   2. CKD -  Now improving >> reviewed together last BUN/Cr -  she saw nephrology (Dr. Marval Regal)  Philemon Kingdom, MD PhD [facility]

## 2017-03-05 ENCOUNTER — Other Ambulatory Visit: Payer: Self-pay | Admitting: Internal Medicine

## 2017-03-16 ENCOUNTER — Other Ambulatory Visit: Payer: Self-pay

## 2017-03-16 DIAGNOSIS — G5601 Carpal tunnel syndrome, right upper limb: Secondary | ICD-10-CM | POA: Diagnosis not present

## 2017-03-16 DIAGNOSIS — M5442 Lumbago with sciatica, left side: Secondary | ICD-10-CM | POA: Diagnosis not present

## 2017-03-16 DIAGNOSIS — M545 Low back pain: Secondary | ICD-10-CM | POA: Diagnosis not present

## 2017-03-21 ENCOUNTER — Other Ambulatory Visit: Payer: Self-pay

## 2017-03-21 MED ORDER — BUPROPION HCL ER (XL) 300 MG PO TB24: 300.0000 mg | ORAL_TABLET | Freq: Every day | ORAL | 1 refills | Status: DC

## 2017-03-23 DIAGNOSIS — L720 Epidermal cyst: Secondary | ICD-10-CM | POA: Diagnosis not present

## 2017-03-23 DIAGNOSIS — C44629 Squamous cell carcinoma of skin of left upper limb, including shoulder: Secondary | ICD-10-CM | POA: Diagnosis not present

## 2017-03-23 DIAGNOSIS — D0472 Carcinoma in situ of skin of left lower limb, including hip: Secondary | ICD-10-CM | POA: Diagnosis not present

## 2017-03-23 DIAGNOSIS — Z85828 Personal history of other malignant neoplasm of skin: Secondary | ICD-10-CM | POA: Diagnosis not present

## 2017-03-23 DIAGNOSIS — L570 Actinic keratosis: Secondary | ICD-10-CM | POA: Diagnosis not present

## 2017-03-25 ENCOUNTER — Other Ambulatory Visit: Payer: Self-pay

## 2017-03-25 MED ORDER — ROPINIROLE HCL 3 MG PO TABS: 3.0000 mg | ORAL_TABLET | Freq: Four times a day (QID) | ORAL | 1 refills | Status: DC

## 2017-03-28 ENCOUNTER — Other Ambulatory Visit: Payer: Self-pay | Admitting: *Deleted

## 2017-03-28 MED ORDER — ROPINIROLE HCL 3 MG PO TABS: 3.0000 mg | ORAL_TABLET | Freq: Four times a day (QID) | ORAL | 1 refills | Status: DC

## 2017-03-30 ENCOUNTER — Ambulatory Visit
Admission: RE | Admit: 2017-03-30 | Discharge: 2017-03-30 | Disposition: A | Discharge status: Home / Self Care | Payer: PPO | Source: Ambulatory Visit | Attending: Orthopedic Surgery | Admitting: Orthopedic Surgery

## 2017-03-30 DIAGNOSIS — M5442 Lumbago with sciatica, left side: Secondary | ICD-10-CM

## 2017-03-30 DIAGNOSIS — M48061 Spinal stenosis, lumbar region without neurogenic claudication: Secondary | ICD-10-CM | POA: Diagnosis not present

## 2017-04-05 DIAGNOSIS — M545 Low back pain: Secondary | ICD-10-CM | POA: Diagnosis not present

## 2017-04-05 DIAGNOSIS — M25552 Pain in left hip: Secondary | ICD-10-CM | POA: Diagnosis not present

## 2017-04-05 DIAGNOSIS — M5442 Lumbago with sciatica, left side: Secondary | ICD-10-CM | POA: Diagnosis not present

## 2017-04-20 ENCOUNTER — Other Ambulatory Visit: Payer: Self-pay | Admitting: Internal Medicine

## 2017-04-20 ENCOUNTER — Other Ambulatory Visit: Payer: Self-pay | Admitting: Cardiology

## 2017-04-21 ENCOUNTER — Other Ambulatory Visit: Payer: Self-pay | Admitting: Internal Medicine

## 2017-04-26 ENCOUNTER — Encounter: Payer: Self-pay | Admitting: Physician Assistant

## 2017-04-26 ENCOUNTER — Encounter: Payer: Self-pay | Admitting: Internal Medicine

## 2017-04-29 ENCOUNTER — Telehealth: Payer: Self-pay | Admitting: Cardiovascular Disease

## 2017-04-29 DIAGNOSIS — M5416 Radiculopathy, lumbar region: Secondary | ICD-10-CM | POA: Diagnosis not present

## 2017-04-29 NOTE — Telephone Encounter (Signed)
New message       [facility] Pre-operative Risk Assessment    Request for surgical clearance:  1. What type of surgery is being performed? Epidural injection, L5   2. When is this surgery scheduled? n/a  3. Are there any medications that need to be held prior to surgery and how long? Plavix  4. Practice name and name of physician performing surgery? Dr Juliet Rude- [facility]  5. What is your office phone and fax number? Phone# [phone]  1. Anesthesia type (None, local, MAC, general) ? local   Laurier Nancy 04/29/2017, 2:12 PM  _________________________________________________________________   (provider comments below)

## 2017-04-29 NOTE — Telephone Encounter (Signed)
Cannot stop plavix until after 05/09/17      Primary Cardiologist:No primary care provider on file.  Chart reviewed as part of pre-operative protocol coverage. Because of SHEVON SIAN past medical history and time since last visit, he/she will require a follow-up visit in order to better assess preoperative cardiovascular risk.  Pre-op covering staff: - Please schedule appointment and call patient to inform them. - Please contact requesting surgeon's office via preferred method (i.e, phone, fax) to inform them of need for appointment prior to surgery.  Cecilie Kicks, NP  04/29/2017, 4:03 PM

## 2017-05-04 NOTE — Telephone Encounter (Signed)
See previous note by Mickel Baas. I have not seen any scheduled followup

## 2017-05-04 NOTE — Telephone Encounter (Signed)
Left pt msg to call. 

## 2017-05-06 NOTE — Telephone Encounter (Signed)
Left message for patient to call back to schedule an appointment.  °

## 2017-05-09 ENCOUNTER — Other Ambulatory Visit: Payer: Self-pay | Admitting: Internal Medicine

## 2017-05-09 ENCOUNTER — Encounter: Payer: Self-pay | Admitting: *Deleted

## 2017-05-09 NOTE — Telephone Encounter (Signed)
Tried to contact pt re: Cardiac Clearance. Left another message for pt to call back. This is the 4th attempt, so will mail letter and close encounter.

## 2017-05-23 ENCOUNTER — Other Ambulatory Visit: Payer: Self-pay

## 2017-05-23 MED ORDER — CLOPIDOGREL BISULFATE 75 MG PO TABS: 75.0000 mg | ORAL_TABLET | Freq: Every day | ORAL | 0 refills | Status: DC

## 2017-05-31 ENCOUNTER — Encounter: Payer: Self-pay | Admitting: Cardiology

## 2017-06-02 ENCOUNTER — Ambulatory Visit (INDEPENDENT_AMBULATORY_CARE_PROVIDER_SITE_OTHER): Payer: PPO | Admitting: Internal Medicine

## 2017-06-02 ENCOUNTER — Encounter: Payer: Self-pay | Admitting: Internal Medicine

## 2017-06-02 ENCOUNTER — Encounter: Payer: Self-pay | Admitting: Cardiology

## 2017-06-02 ENCOUNTER — Ambulatory Visit (INDEPENDENT_AMBULATORY_CARE_PROVIDER_SITE_OTHER): Payer: PPO | Admitting: Cardiology

## 2017-06-02 VITALS — BP 132/60 | HR 80 | Temp 98.2°F | Resp 16 | Ht 63.0 in | Wt 235.6 lb

## 2017-06-02 VITALS — BP 141/64 | HR 82 | Ht 63.0 in | Wt 235.0 lb

## 2017-06-02 DIAGNOSIS — I10 Essential (primary) hypertension: Secondary | ICD-10-CM | POA: Diagnosis not present

## 2017-06-02 DIAGNOSIS — Z01818 Encounter for other preprocedural examination: Secondary | ICD-10-CM | POA: Diagnosis not present

## 2017-06-02 DIAGNOSIS — E1122 Type 2 diabetes mellitus with diabetic chronic kidney disease: Secondary | ICD-10-CM | POA: Diagnosis not present

## 2017-06-02 DIAGNOSIS — E118 Type 2 diabetes mellitus with unspecified complications: Secondary | ICD-10-CM | POA: Diagnosis not present

## 2017-06-02 DIAGNOSIS — Z9989 Dependence on other enabling machines and devices: Secondary | ICD-10-CM | POA: Diagnosis not present

## 2017-06-02 DIAGNOSIS — Z9861 Coronary angioplasty status: Secondary | ICD-10-CM | POA: Diagnosis not present

## 2017-06-02 DIAGNOSIS — E785 Hyperlipidemia, unspecified: Secondary | ICD-10-CM | POA: Diagnosis not present

## 2017-06-02 DIAGNOSIS — M47817 Spondylosis without myelopathy or radiculopathy, lumbosacral region: Secondary | ICD-10-CM

## 2017-06-02 DIAGNOSIS — I2510 Atherosclerotic heart disease of native coronary artery without angina pectoris: Secondary | ICD-10-CM

## 2017-06-02 DIAGNOSIS — I252 Old myocardial infarction: Secondary | ICD-10-CM | POA: Diagnosis not present

## 2017-06-02 DIAGNOSIS — M5137 Other intervertebral disc degeneration, lumbosacral region: Secondary | ICD-10-CM

## 2017-06-02 DIAGNOSIS — G4733 Obstructive sleep apnea (adult) (pediatric): Secondary | ICD-10-CM

## 2017-06-02 DIAGNOSIS — Z794 Long term (current) use of insulin: Secondary | ICD-10-CM

## 2017-06-02 DIAGNOSIS — N183 Chronic kidney disease, stage 3 unspecified: Secondary | ICD-10-CM

## 2017-06-02 LAB — POCT GLYCOSYLATED HEMOGLOBIN (HGB A1C)

## 2017-06-02 MED ORDER — EZETIMIBE 10 MG PO TABS: 10.0000 mg | ORAL_TABLET | Freq: Every day | ORAL | 3 refills | Status: DC

## 2017-06-02 NOTE — Patient Instructions (Addendum)
Please continue: - Basaglar 70 units after dinner - Novolog:  -16 units before a smaller meal  -20 units before a larger meal  Try to take all insulin doses consistently and start checking sugars at least 2x a day.  Please return in 3 months with your sugar log.

## 2017-06-02 NOTE — Progress Notes (Signed)
06/02/2017 TRULY STANKIEWICZ   1940/12/31  [id number]  Primary Physician [name], MD Primary Cardiologist: Dr Oval Linsey  HPI:  77 y.o. female with CAD s/p NSTEMI with PCI of the LAD and LCx, HTN, HLD, OSA (non complaint with C-pap), Type 2 IDDM, morbid obesity, CKD III, and severe L-S DJD who presents for pre op clearance prior to planned back injections. Ms. Vanburen presented to [facility] 04/2016 with NSTEMI.  She underwent LHC that revealed 95-99% mid LAD and LCx lesions that were successfully opened with Onyx Resolute DESs. PCI of the left circumflex artery was complicated by microembolization. Left ventriculogram revealed LVEF 40-50% at the time of her cath.  Subsequent echo revealed LVEF 50-55% with akinesis of the distal septal and apical myocardium. There is also akinesis of the apical inferior myocardium and grade 1 diastolic dysfunction. She has done well from a cardiac standpoint since. She has not used NTG. She has sever L-S DJD. Dr Jacelyn Grip would like to schedule epidural injections.    Current Outpatient Medications  Medication Sig Dispense Refill  . acetaminophen (TYLENOL) 325 MG tablet Take 2 tablets (650 mg total) by mouth every 4 (four) hours as needed for headache or mild pain.    Marland Kitchen allopurinol (ZYLOPRIM) 100 MG tablet Take 1 tablet daily to prevent Gout 90 tablet 3  . aspirin EC 81 MG EC tablet Take 1 tablet (81 mg total) by mouth daily.    Marland Kitchen atorvastatin (LIPITOR) 80 MG tablet Take 1 tablet (80 mg total) by mouth daily at 6 PM. Need office visit 90 tablet 0  . buPROPion (WELLBUTRIN XL) 300 MG 24 hr tablet Take 1 tablet (300 mg total) daily by mouth. 90 tablet 1  . Cholecalciferol ([employer]S VIT D 5000 HIGH-POTENCY) 5000 units capsule Take 5,000 Units by mouth 2 (two) times daily.     . clopidogrel (PLAVIX) 75 MG tablet Take 1 tablet (75 mg total) by mouth daily. 90 tablet 0  . colchicine 0.6 MG tablet Take 2 tablets first day of gout flare, then one a day until gout flare is  better 90 tablet 1  . FLECTOR 1.3 % PTCH APPLY 1 PATCH EVERY 12 HOURS AS NEEDED THEN OFF 12 HOURS 30 patch 0  . furosemide (LASIX) 40 MG tablet Take 1 tablet 3 x / day for fluid retention 270 tablet 1  . gabapentin (NEURONTIN) 600 MG tablet TAKE ONE-HALF TO ONE TABLET BY MOUTH THREE TIMES DAILY FOR PAIN. 270 tablet 1  . glucose blood (ONETOUCH VERIO) test strip Use as instructed to check sugar 2 times daily. 200 each 5  . HYDROcodone-acetaminophen (NORCO/VICODIN) 5-325 MG tablet Take 1 tablet by mouth every 6 (six) hours as needed for moderate pain.    Marland Kitchen insulin aspart (NOVOLOG FLEXPEN) 100 UNIT/ML FlexPen Inject 16-20 Units into the skin 3 (three) times daily with meals. (Patient taking differently: Inject 16-20 Units into the skin 2 (two) times daily before a meal. ) 10 pen 11  . Insulin Glargine (BASAGLAR KWIKPEN) 100 UNIT/ML SOPN INJECT 70 UNITS INTO THE SKIN AT BEDTIME. 75 pen 0  . Insulin Pen Needle (CAREFINE PEN NEEDLES) 32G X 4 MM MISC Use 4x a day 300 each 11  . Lancets (ONETOUCH ULTRASOFT) lancets Use as instructed 100 each 12  . losartan (COZAAR) 50 MG tablet TAKE 1 TABLET BY MOUTH EVERY DAY 90 tablet 1  . nitroGLYCERIN (NITROSTAT) 0.4 MG SL tablet Place 1 tablet (0.4 mg total) under the tongue every 5 (five)  minutes x 3 doses as needed for chest pain. 25 tablet 4  . rOPINIRole (REQUIP) 3 MG tablet Take 1 tablet (3 mg total) 4 (four) times daily by mouth. 360 tablet 1  . tiZANidine (ZANAFLEX) 4 MG tablet Take 4 mg by mouth every 6 (six) hours as needed for muscle spasms.    . TRULICITY 1.5 GH/8.2XH SOPN INJECT 1.5 MG WEEKLY 2 pen 2  . ezetimibe (ZETIA) 10 MG tablet Take 1 tablet (10 mg total) by mouth daily. 90 tablet 3   No current facility-administered medications for this visit.     Allergies  Allergen Reactions  . Brilinta [Ticagrelor] Shortness Of Breath and Other (See Comments)    Chest pain (also)  . Aspirin Other (See Comments)    Can tolerate in small doses (is already on  a blood thinner AND has kidney disease)  . Nsaids Other (See Comments)    Patient is taking a blood thinner and has kidney disease  . Minocycline Hcl Other (See Comments)    Welts  . Oruvail [Ketoprofen] Other (See Comments)    Has kidney disease and is taking a blood thinner  . Tricor [Fenofibrate]     Reaction unknown  . Vasotec [Enalaprilat]     Reaction unknown  . Zinc     Reaction unknown    Past Medical History:  Diagnosis Date  . Anemia    hx (04/14/2016)  . Anxiety   . Arthritis    "severe in my back; hands; ankles" (04/14/2016)  . Basal cell carcinoma    "several burned off; one cut off"  . CAD in native artery    a. NSTEMI 04/2016 - s/p DES toLAD and LCx. PCI to LCx notable for microembolization during cath.  . Chest pain- reslved with stopping Brilinta now on Plaix 04/16/2016  . Chronic lower back pain   . CKD (chronic kidney disease), stage III ([place])   . Depression   . DJD (degenerative joint disease)   . Family history of adverse reaction to anesthesia    "half-sister used to get real sick" (04/14/2016)  . GERD (gastroesophageal reflux disease)   . History of gout   . Hyperlipidemia   . Hypertension   . IBS (irritable bowel syndrome)   . Ischemic cardiomyopathy    a. 04/2016: EF 40-50% by cath, 50-55% +WMA by echo.  . 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 ([place]) ~ 2015  . Morbid obesity ([place])   . NSTEMI (non-ST elevated myocardial infarction) ([place]) 04/14/2016  . OSA on CPAP   . Peripheral neuropathy   . Peripheral vascular disease ([place])   . RLS (restless legs syndrome)   . Spinal stenosis   . Type II diabetes mellitus ([place])   . Vitamin D deficiency     Social History   Socioeconomic History  . Marital status: Widowed    Spouse name: Not on file  . Number of children: Not on file  . Years of education: Not on file  . Highest education level: Not on file  Social Needs  . Financial resource strain: Not on file  . Food insecurity - worry: Not on  file  . Food insecurity - inability: Not on file  . Transportation needs - medical: Not on file  . Transportation needs - non-medical: Not on file  Occupational History  . Not on file  Tobacco Use  . Smoking status: Former Smoker    Packs/day: 2.50    Years: 25.00    Pack  years: 62.50    Types: Cigarettes    Last attempt to quit: 05/11/1983    Years since quitting: 34.0  . Smokeless tobacco: Former Network engineer and Sexual Activity  . Alcohol use: No    Alcohol/week: 0.0 oz  . Drug use: No  . Sexual activity: No  Other Topics Concern  . Not on file  Social History Narrative  . Not on file     Family History  Problem Relation Age of Onset  . Heart disease Mother   . Kidney disease Father   . AAA (abdominal aortic aneurysm) Father   . Heart disease Son   . Colon cancer Neg Hx   . Colon polyps Neg Hx   . Esophageal cancer Neg Hx   . Pancreatic cancer Neg Hx   . Stomach cancer Neg Hx   . Liver disease Neg Hx   . Diabetes Neg Hx      Review of Systems: General: negative for chills, fever, night sweats or weight changes.  Cardiovascular: negative for chest pain, dyspnea on exertion, edema, orthopnea, palpitations, paroxysmal nocturnal dyspnea or shortness of breath Dermatological: negative for rash Respiratory: negative for cough or wheezing Urologic: negative for hematuria Abdominal: negative for nausea, vomiting, diarrhea, bright red blood per rectum, melena, or hematemesis Neurologic: negative for visual changes, syncope, or dizziness All other systems reviewed and are otherwise negative except as noted above.    Blood pressure (!) 141/64, pulse 82, height 5\' 3"  (1.6 m), weight 235 lb (106.6 kg).  General appearance: alert, cooperative, no distress and morbidly obese Neck: no carotid bruit Lungs: clear to auscultation bilaterally Heart: regular rate and rhythm Extremities: trace edema Neurologic: Grossly normal  EKG NSR, poor anterior RW  ASSESSMENT AND  PLAN:   Pre-operative clearance Seen today for pre op clearance prior to spinal injection  CAD S/P percutaneous coronary angioplasty LAD and CFX PCI with DES Dec 2017 She has done well since . Of note she stopped her ASA on her own secondary to bruising  History of non-ST elevation myocardial infarction (NSTEMI) NSTEMI Dec 2017- 2 V PCI. EF 50-55% by echo post MI  Essential hypertension Fair control  Type 2 IDDM with complication Type 2 IDDM with CRI, vascular disease  OSA on CPAP Non compliant  Morbid obesity BMI 41  Dyslipidemia LDL 102- Aug 2018 on Lipitor 80 mg Goal LDL is 70- add Zetia   PLAN  Discussed with Dr Oval Linsey. OK to stop Plavix pre op as directed by Dr Jacelyn Grip. We would suggest she take a baby ASA when she stops her Plavix and hold that 48 hrs pre op. Resume Plavix only post op.  Zetia 10 mg added to Lipitor as her LDL goal is 70 or less. F/U Lipids when she sees Dr Oval Linsey in April. I strongly encouraged her to resume her C-pap.   Kerin Ransom PA-C 06/02/2017 8:44 AM

## 2017-06-02 NOTE — Assessment & Plan Note (Signed)
Seen today for pre op clearance prior to spinal injection

## 2017-06-02 NOTE — Assessment & Plan Note (Signed)
NSTEMI Dec 2017- 2 V PCI. EF 50-55% by echo post MI

## 2017-06-02 NOTE — Assessment & Plan Note (Signed)
Non compliant 

## 2017-06-02 NOTE — Assessment & Plan Note (Signed)
LDL 102- Aug 2018 on Lipitor 80 mg Goal LDL is 70- add Zetia

## 2017-06-02 NOTE — Progress Notes (Signed)
Patient ID: Janet Choi, female   DOB: May 16, 1940, 77 y.o.   MRN: [id number]\  HPI: Janet Choi is a 77 y.o.-year-old female, returning for f/u for DM2, dx in ~2009, insulin-dependent since 10/2015, uncontrolled, with complications (CAD - s/p NSTEMI, CKD, PN). Last visit 3 months ago. She has HTA and BCBS.  She had a lot of stress since last visit. Also, ate more during the Holidays. Also, missed insulin doses. She also did not check sugars.  She has more back pain >> will restart steroid injections.  Last hemoglobin A1c was: Lab Results  Component Value Date   HGBA1C 6.3 03/02/2017   HGBA1C 6.7 (H) 11/30/2016   HGBA1C 7.4 08/11/2016   Pt was on a regimen of 70/30 insulin * 40 units before b'fast * 55 units before dinner tonight and if no lows >> 60 units before dinner Previously: Metformin and Glimepiride. She was on Victoza.  Now on: - Basaglar 70 units after dinner - Novolog:  -16 units before a smaller meal  -20 units before a larger meal She stopped Trulicity 1.5 mg weekly b/c diarrhea, flatulence.  Pt did not check sugars since last visit:  - am: 116-287 >> 92-192, 201>> 104-156 >> 128-202 >> 122 - 2h after b'fast: n/c >> 280,  327 >> n/c >> 127-149, 308 >> n/c - before lunch: n/c >> 86, 105-173, 211 (Prednisone) >> n/c - 2h after lunch: 237 >> n/c >> 207-541 >> 83-211 >> n/c  - before dinner:103-179 (snack) >> 60, 139-167, 204 >> n/c - 2h after dinner: n/c >> 155-230 >> 177 >> 131, 134 >> n/c - bedtime: 90, 107-219, 256 >> 140 >> 85-171, 215 >> /c - nighttime: n/c Lowest sugar was  60 >> felt low but did not check;  unclear if she has hypoglycemia awareness. Highest 308 >> ?   Glucometer: One Touch Ultra  Pt's meals are: - Breakfast: bagel or 2 pieces toast; coffee + sugar + powdered creamer, sometimes eggs (once a week) - Lunch: 1/2 PB sandwich or goes out - shares a plate with her sister - Dinner: mac and cheese + veggies; chicken + veggies + some starch -  Snacks: after dinner (dinner is at 6 pm, she goes to bed at 1 -2 am) - popcorn, peanuts, sunflower seeds, candy, cookies  -+ CKD, last BUN/creatinine worse: Lab Results  Component Value Date   BUN 15 02/07/2017   BUN 23 12/28/2016   CREATININE 1.08 (H) 02/07/2017   CREATININE 1.44 (H) 12/28/2016  On losartan.  -+ HL; last set of lipids: Lab Results  Component Value Date   CHOL 182 12/28/2016   HDL 39 (L) 12/28/2016   LDLCALC 73 09/27/2016   TRIG 289 (H) 12/28/2016   CHOLHDL 4.7 12/28/2016  On Lipitor. Added Zetia recently.  - last eye exam was on 02/2017: No DR. + Glaucoma. Dr. Satira Sark. -+ Numbness and tingling in her feet. Has a podiatrist (Dr. Bjorn Loser). On Neurontin 600 mg 2x a day.  She had an NSTEMI 04/14/2016 >> 2 stents. She was in ED for CP again 04/16/2016 and 04/28/2016. She has a history of kidney stones, anxiety/depression.  ROS: Constitutional: no weight gain/no weight loss, no fatigue, no subjective hyperthermia, no subjective hypothermia Eyes: no blurry vision, no xerophthalmia ENT: no sore throat, no nodules palpated in throat, no dysphagia, no odynophagia, + hoarseness Cardiovascular: no CP/no SOB/no palpitations/+ leg swelling Respiratory: no cough/no SOB/no wheezing Gastrointestinal: no N/no V/+ D >> resolved after stopping Trulicity/no C/no  acid reflux Musculoskeletal: no muscle aches/+ joint aches Skin: no rashes, no hair loss Neurological: no tremors/+ numbness/+ tingling/no dizziness  I reviewed pt's medications, allergies, PMH, social hx, family hx, and changes were documented in the history of present illness. Otherwise, unchanged from my initial visit note.  Past Medical History:  Diagnosis Date  . Anemia    hx (04/14/2016)  . Anxiety   . Arthritis    "severe in my back; hands; ankles" (04/14/2016)  . Basal cell carcinoma    "several burned off; one cut off"  . CAD in native artery    a. NSTEMI 04/2016 - s/p DES toLAD and LCx. PCI to LCx notable  for microembolization during cath.  . Chest pain- reslved with stopping Brilinta now on Plaix 04/16/2016  . Chronic lower back pain   . CKD (chronic kidney disease), stage III ([place])   . Depression   . DJD (degenerative joint disease)   . Family history of adverse reaction to anesthesia    "half-sister used to get real sick" (04/14/2016)  . GERD (gastroesophageal reflux disease)   . History of gout   . Hyperlipidemia   . Hypertension   . IBS (irritable bowel syndrome)   . Ischemic cardiomyopathy    a. 04/2016: EF 40-50% by cath, 50-55% +WMA by echo.  . 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 ([place]) ~ 2015  . Morbid obesity ([place])   . NSTEMI (non-ST elevated myocardial infarction) ([place]) 04/14/2016  . OSA on CPAP   . Peripheral neuropathy   . Peripheral vascular disease ([place])   . RLS (restless legs syndrome)   . Spinal stenosis   . Type II diabetes mellitus ([place])   . Vitamin D deficiency    Past Surgical History:  Procedure Laterality Date  . APPENDECTOMY    . BACK SURGERY    . BASAL CELL CARCINOMA EXCISION Left    leg  . CARDIAC CATHETERIZATION N/A 04/15/2016   Procedure: Left Heart Cath and Coronary Angiography;  Surgeon: Belva Crome, MD;  Location: [facility];  Service: Cardiovascular;  Laterality: N/A;  . CARDIAC CATHETERIZATION N/A 04/15/2016   Procedure: Coronary Stent Intervention;  Surgeon: Belva Crome, MD;  Location: [facility];  Service: Cardiovascular;  Laterality: N/A;  Mid LAD Mid CFX  . CATARACT EXTRACTION, BILATERAL Bilateral   . JOINT REPLACEMENT    . LUMBAR DISC SURGERY  X 2  . MELANOMA EXCISION Left    "towards the back of my neck"  . SHOULDER ARTHROSCOPY W/ ROTATOR CUFF REPAIR Left   . SHOULDER OPEN ROTATOR CUFF REPAIR Right   . TOTAL KNEE ARTHROPLASTY Bilateral    Social History   Social History  . Marital status: Single    Spouse name: N/A  . Number of children: 1   Occupational History  . Retired, works part-time, 16 hours a week     Social History Main Topics  . Smoking status: Former Smoker    Quit date: 05/11/1983  . Smokeless tobacco: Never Used  . Alcohol use No  . Drug use: No   Current Outpatient Medications on File Prior to Visit  Medication Sig Dispense Refill  . acetaminophen (TYLENOL) 325 MG tablet Take 2 tablets (650 mg total) by mouth every 4 (four) hours as needed for headache or mild pain.    Marland Kitchen aspirin EC 81 MG EC tablet Take 1 tablet (81 mg total) by mouth daily.    Marland Kitchen atorvastatin (LIPITOR) 80 MG tablet Take  1 tablet (80 mg total) by mouth daily at 6 PM. Need office visit 90 tablet 0  . buPROPion (WELLBUTRIN XL) 300 MG 24 hr tablet Take 1 tablet (300 mg total) daily by mouth. 90 tablet 1  . Cholecalciferol ([employer]S VIT D 5000 HIGH-POTENCY) 5000 units capsule Take 5,000 Units by mouth 2 (two) times daily.     . clopidogrel (PLAVIX) 75 MG tablet Take 1 tablet (75 mg total) by mouth daily. 90 tablet 0  . colchicine 0.6 MG tablet Take 2 tablets first day of gout flare, then one a day until gout flare is better 90 tablet 1  . FLECTOR 1.3 % PTCH APPLY 1 PATCH EVERY 12 HOURS AS NEEDED THEN OFF 12 HOURS 30 patch 0  . furosemide (LASIX) 40 MG tablet Take 1 tablet 3 x / day for fluid retention 270 tablet 1  . gabapentin (NEURONTIN) 600 MG tablet TAKE ONE-HALF TO ONE TABLET BY MOUTH THREE TIMES DAILY FOR PAIN. 270 tablet 1  . glucose blood (ONETOUCH VERIO) test strip Use as instructed to check sugar 2 times daily. 200 each 5  . HYDROcodone-acetaminophen (NORCO/VICODIN) 5-325 MG tablet Take 1 tablet by mouth every 6 (six) hours as needed for moderate pain.    Marland Kitchen insulin aspart (NOVOLOG FLEXPEN) 100 UNIT/ML FlexPen Inject 16-20 Units into the skin 3 (three) times daily with meals. (Patient taking differently: Inject 16-20 Units into the skin 2 (two) times daily before a meal. ) 10 pen 11  . Insulin Glargine (BASAGLAR KWIKPEN) 100 UNIT/ML SOPN INJECT 70 UNITS INTO THE SKIN AT BEDTIME. 75 pen 0  . Insulin Pen Needle  (CAREFINE PEN NEEDLES) 32G X 4 MM MISC Use 4x a day 300 each 11  . Lancets (ONETOUCH ULTRASOFT) lancets Use as instructed 100 each 12  . losartan (COZAAR) 50 MG tablet TAKE 1 TABLET BY MOUTH EVERY DAY 90 tablet 1  . nitroGLYCERIN (NITROSTAT) 0.4 MG SL tablet Place 1 tablet (0.4 mg total) under the tongue every 5 (five) minutes x 3 doses as needed for chest pain. 25 tablet 4  . rOPINIRole (REQUIP) 3 MG tablet Take 1 tablet (3 mg total) 4 (four) times daily by mouth. 360 tablet 1  . tiZANidine (ZANAFLEX) 4 MG tablet Take 4 mg by mouth every 6 (six) hours as needed for muscle spasms.    Marland Kitchen allopurinol (ZYLOPRIM) 100 MG tablet Take 1 tablet daily to prevent Gout (Patient not taking: Reported on 06/02/2017) 90 tablet 3  . TRULICITY 1.5 HU/7.6LY SOPN INJECT 1.5 MG WEEKLY (Patient not taking: Reported on 06/02/2017) 2 pen 2   No current facility-administered medications on file prior to visit.    Allergies  Allergen Reactions  . Brilinta [Ticagrelor] Shortness Of Breath and Other (See Comments)    Chest pain (also)  . Aspirin Other (See Comments)    Can tolerate in small doses (is already on a blood thinner AND has kidney disease)  . Nsaids Other (See Comments)    Patient is taking a blood thinner and has kidney disease  . Minocycline Hcl Other (See Comments)    Welts  . Oruvail [Ketoprofen] Other (See Comments)    Has kidney disease and is taking a blood thinner  . Tricor [Fenofibrate]     Reaction unknown  . Vasotec [Enalaprilat]     Reaction unknown  . Zinc     Reaction unknown   Family History  Problem Relation Age of Onset  . Heart disease Mother   . Kidney disease  Father   . AAA (abdominal aortic aneurysm) Father   . Heart disease Son   . Colon cancer Neg Hx   . Colon polyps Neg Hx   . Esophageal cancer Neg Hx   . Pancreatic cancer Neg Hx   . Stomach cancer Neg Hx   . Liver disease Neg Hx   . Diabetes Neg Hx    PE: BP 132/60 (BP Location: Left Arm, Patient Position: Sitting,  Cuff Size: Large)   Pulse 80   Temp 98.2 F (36.8 C) (Oral)   Resp 16   Ht _0  (1.6 m)   Wt 235 lb 9.6 oz (106.9 kg)   SpO2 96%   BMI 41.73 kg/m  Wt Readings from Last 3 Encounters:  06/02/17 235 lb 9.6 oz (106.9 kg)  06/02/17 235 lb (106.6 kg)  03/02/17 231 lb (104.8 kg)   Constitutional: overweight, in NAD Eyes: PERRLA, EOMI, no exophthalmos ENT: moist mucous membranes, no thyromegaly, no cervical lymphadenopathy Cardiovascular: RRR, No MRG, + mild pitting lower extremity edema Respiratory: CTA B Gastrointestinal: abdomen soft, NT, ND, BS+ Musculoskeletal: no deformities, strength intact in all 4 Skin: moist, warm, no rashes Neurological: no tremor with outstretched hands, DTR normal in all 4  ASSESSMENT: 1. DM2, insulin-dependent, uncontrolled, with complications - CAD - s/p NSTEMI - PN - CKD  2. HL  PLAN:  1. Patient with long-standing, uncontrolled, type 2 diabetes, with higher sugars at this visit compared to before.  She is on a basal-bolus insulin regimen and was previously on GLP-1 receptor agonist but she had to stop due to GI symptoms, including diarrhea.  She would not want to restart.  Unfortunately, since last visit, she stopped checking blood sugars, was eating more during the holidays and also missed insulin doses.  We discussed about the absolute need to start checking sugars and take the insulin as prescribed, but we do not need to restart her GLP-1 receptor agonist. - Without blood sugars, I cannot make any changes in her regimen at this point, but we discussed about the fact that insulin can stay out of the fridge so she can take the pens with her when she goes out so she can make sure that she takes the NovoLog before every meal.  She is usually remembering Basaglar.  -We also discussed about the possibility of adding an SGLT-2 inhibitor but she is concerned about her kidney function so for now, we will try to only use insulin - I suggested to:  Patient  Instructions  Please continue: - Basaglar 70 units after dinner - Novolog:  -16 units before a smaller meal  -20 units before a larger meal  Try to take all insulin doses consistently and start checking sugars at least 2x a day.  Please return in 3 months with your sugar log.     - today, HbA1c is 8.7% (much higher) - continue checking sugars at different times of the day - check 3x a day, rotating checks - advised for yearly eye exams >> she is UTD - Return to clinic in 3 mo with sugar log    2. HL - Reviewed lipid levels from 12/2016: LDL at goal, but triglycerides high. - Continues on Lipitor without side effects and Zetia was added by cardiology recently  Philemon Kingdom, MD PhD [facility]

## 2017-06-02 NOTE — Assessment & Plan Note (Signed)
BMI 41 

## 2017-06-02 NOTE — Addendum Note (Signed)
Addended by: Zebedee Iba on: 06/02/2017 08:54 AM   Modules accepted: Orders

## 2017-06-02 NOTE — Assessment & Plan Note (Signed)
Fair control.

## 2017-06-02 NOTE — Assessment & Plan Note (Signed)
Type 2 IDDM with CRI, vascular disease

## 2017-06-02 NOTE — Assessment & Plan Note (Signed)
LAD and CFX PCI with DES Dec 2017 She has done well since . Of note she stopped her ASA on her own secondary to bruising

## 2017-06-02 NOTE — Patient Instructions (Addendum)
Medication Instructions:  START Zetia 10 mg (1 tablet) daily  Follow-Up: In April with Dr. Oval Linsey  Any Other Special Instructions Will Be Listed Below (If Applicable).    If you need a refill on your cardiac medications before your next appointment, please call your pharmacy.

## 2017-06-04 ENCOUNTER — Other Ambulatory Visit: Payer: Self-pay | Admitting: Internal Medicine

## 2017-06-14 NOTE — Progress Notes (Deleted)
MEDICARE ANNUAL WELLNESS VISIT AND FOLLOW UP  Assessment:   Essential hypertension - continue medications, DASH diet, exercise and monitor at home. Call if greater than 130/80.  -     CBC with Differential/Platelet -     BASIC METABOLIC PANEL WITH GFR -     Hepatic function panel -     TSH  NSTEMI (non-ST elevated myocardial infarction) (HCC) Control blood pressure, cholesterol, glucose, increase exercise.   Hypertensive heart disease without heart failure - continue medications, DASH diet, exercise and monitor at home. Call if greater than 130/80.  -     BASIC METABOLIC PANEL WITH GFR  CAD in native artery Control blood pressure, cholesterol, glucose, increase exercise.   OSA on CPAP Sleep apnea- continue CPAP, weight loss advised.   Controlled type 2 diabetes mellitus with stage 3 chronic kidney disease, without long-term current use of insulin ([place]) Discussed general issues about diabetes pathophysiology and management., Educational material distributed., Suggested low cholesterol diet., Encouraged aerobic exercise., Discussed foot care., Reminded to get yearly retinal exam. Continue follow up Dr. Darnell Level -     BASIC METABOLIC PANEL WITH GFR  Uncontrolled type 2 diabetes mellitus with stage 3 chronic kidney disease, with long-term current use of insulin ([place]) Discussed general issues about diabetes pathophysiology and management., Educational material distributed., Suggested low cholesterol diet., Encouraged aerobic exercise., Discussed foot care., Reminded to get yearly retinal exam. -     BASIC METABOLIC PANEL WITH GFR  CKD (chronic kidney disease), stage III Increase fluids, avoid NSAIDS, monitor sugars, will monitor - may need referral nephrology -     BASIC METABOLIC PANEL WITH GFR  Hyperlipidemia -continue medications, check lipids, decrease fatty foods, increase activity.  -     Lipid panel  Diverticulosis of large intestine without hemorrhage  History of colonic  polyps  Morbid Obesity with co morbidities - long discussion about weight loss, diet, and exercise  Vitamin D deficiency -     VITAMIN D 25 Hydroxy (Vit-D Deficiency, Fractures)  Medication management -     Magnesium  RLS (restless legs syndrome) Check iron  BMI 38.0-38.9,adult - long discussion about weight loss, diet, and exercise  Medicare annual wellness visit, subsequent Get MGM  Iron deficiency anemia, unspecified iron deficiency anemia type -     Iron and TIBC -     Ferritin -     Vitamin B12  Anxiety   Future Appointments  Date Time Provider [place]  06/16/2017 11:00 AM Vicie Mutters, PA-C [facility] None  09/01/2017 11:00 AM Philemon Kingdom, MD [facility] None  09/05/2017 11:20 AM Skeet Latch, MD [facility]     Plan:   During the course of the visit the patient was educated and counseled about appropriate screening and preventive services including:    Pneumococcal vaccine   Influenza vaccine  Td vaccine  Screening electrocardiogram  Screening mammography  Bone densitometry screening  Colorectal cancer screening  Diabetes screening  Glaucoma screening  Nutrition counseling   Advanced directives: given info/requested  Subjective:   Janet Choi is a 77 y.o. female who presents for Medicare Annual Wellness Visit and 3 month follow up on hypertension,diabetes, hyperlipidemia, vitamin D def, poor compliance.   Her blood pressure has been controlled at home, she is on verapamil, lasix, today [name] is   She does not workout. She denies chest pain, shortness of breath, dizziness.  Following Dr. Berenice Primas for her lower back pain left radicular pain and left arm.  She had NSTEMI  with PCI LAD and LCx 04/2016, on plavix follows Cardio, can come off the plavix in Dec and wants to get back on prozac.  She is on cholesterol medication, lopid BID and zetia and can not tolerate statins, denies myalgias. Her cholesterol is not  at goal. The cholesterol last visit was:   Lab Results  Component Value Date   CHOL 182 12/28/2016   HDL 39 (L) 12/28/2016   LDLCALC 73 09/27/2016   TRIG 289 (H) 12/28/2016   CHOLHDL 4.7 12/28/2016   She has been working on diet and exercise for diabetes, on bASA not on ACE/ARB, she is on trulicity and aspart and basalgalar, she is staying in the 140s', , she is following Dr. 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 Last A1C in the office was:  Lab Results  Component Value Date   HGBA1C 8.7% 06/02/2017   Lab Results  Component Value Date   CREATININE 1.08 (H) 02/07/2017   BUN 15 02/07/2017   NA 137 02/07/2017   K 4.4 02/07/2017   CL 104 02/07/2017   CO2 26 02/07/2017  Last GFR, she does not see nephrology.  Lab Results  Component Value Date   GFRNONAA 50 (L) 02/07/2017   Patient is on Vitamin D supplement. Lab Results  Component Value Date   VD25OH 60 12/28/2016   BMI is There is no height or weight on file to calculate BMI., she is working on diet and exercise and has done well.  Wt Readings from Last 3 Encounters:  06/02/17 235 lb 9.6 oz (106.9 kg)  06/02/17 235 lb (106.6 kg)  03/02/17 231 lb (104.8 kg)   Recent squamous cell removal from left leg.   Names of Other Physician/Practitioners you currently use: 1. [facility]- here for primary care 2. Dr. Satira Sark, eye doctor, last visit 07/2016 3. Dr. Luretha Rued, dentist, last visit 08/2016 4. Dr. Lorin Mercy, spinal stenosis.  Patient Care Team: [name], MD as PCP - General (Internal Medicine) Marygrace Drought, MD as Consulting Physician (Ophthalmology) Inda Castle, MD (Inactive) as Consulting Physician (Gastroenterology) Larey Dresser, MD as Consulting Physician (Cardiology) Nobie Putnam, MD as Consulting Physician (Oncology) Tania Ade, MD as Consulting Physician (Orthopedic  Surgery)  Medication Review Current Outpatient Medications on File Prior to Visit  Medication Sig  . acetaminophen (TYLENOL) 325 MG tablet Take 2 tablets (650 mg total) by mouth every 4 (four) hours as needed for headache or mild pain.  Marland Kitchen allopurinol (ZYLOPRIM) 100 MG tablet Take 1 tablet daily to prevent Gout (Patient not taking: Reported on 06/02/2017)  . aspirin EC 81 MG EC tablet Take 1 tablet (81 mg total) by mouth daily.  Marland Kitchen atorvastatin (LIPITOR) 80 MG tablet Take 1 tablet (80 mg total) by mouth daily at 6 PM. Need office visit  . buPROPion (WELLBUTRIN XL) 300 MG 24 hr tablet Take 1 tablet (300 mg total) daily by mouth.  . Cholecalciferol ([employer] VIT D 5000 HIGH-POTENCY) 5000 units capsule Take 5,000 Units by mouth 2 (two) times daily.   . clopidogrel (PLAVIX) 75 MG tablet Take 1 tablet (75 mg total) by mouth daily.  . colchicine 0.6 MG tablet Take 2 tablets first day of gout flare, then one a day until gout flare is better  . ezetimibe (ZETIA) 10 MG tablet Take 1 tablet (10 mg total) by mouth daily.  Marland Kitchen FLECTOR 1.3 %  PTCH APPLY 1 PATCH EVERY 12 HOURS AS NEEDED THEN OFF 12 HOURS  . furosemide (LASIX) 40 MG tablet Take 1 tablet 3 x / day for fluid retention  . gabapentin (NEURONTIN) 600 MG tablet TAKE ONE-HALF TO ONE TABLET BY MOUTH THREE TIMES DAILY FOR PAIN.  . glucose blood (ONETOUCH VERIO) test strip Use as instructed to check sugar 2 times daily.  Marland Kitchen HYDROcodone-acetaminophen (NORCO/VICODIN) 5-325 MG tablet Take 1 tablet by mouth every 6 (six) hours as needed for moderate pain.  Marland Kitchen insulin aspart (NOVOLOG FLEXPEN) 100 UNIT/ML FlexPen Inject 16-20 Units into the skin 3 (three) times daily with meals. (Patient taking differently: Inject 16-20 Units into the skin 2 (two) times daily before a meal. )  . Insulin Glargine (BASAGLAR KWIKPEN) 100 UNIT/ML SOPN INJECT 70 UNITS INTO THE SKIN AT BEDTIME.  . Insulin Pen Needle (CAREFINE PEN NEEDLES) 32G X 4 MM MISC Use 4x a day  . Lancets (ONETOUCH  ULTRASOFT) lancets Use as instructed  . losartan (COZAAR) 50 MG tablet TAKE 1 TABLET BY MOUTH EVERY DAY  . nitroGLYCERIN (NITROSTAT) 0.4 MG SL tablet Place 1 tablet (0.4 mg total) under the tongue every 5 (five) minutes x 3 doses as needed for chest pain.  Marland Kitchen rOPINIRole (REQUIP) 3 MG tablet Take 1 tablet (3 mg total) 4 (four) times daily by mouth.  Marland Kitchen tiZANidine (ZANAFLEX) 4 MG tablet Take 4 mg by mouth every 6 (six) hours as needed for muscle spasms.   No current facility-administered medications on file prior to visit.     Current Problems (verified) Patient Active Problem List   Diagnosis Date Noted  . Pre-operative clearance 06/02/2017  . DJD (degenerative joint disease), lumbosacral 06/02/2017  . 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 Anxiety 04/19/2016  . CAD S/P percutaneous coronary angioplasty   . History of non-ST elevation myocardial infarction (NSTEMI) 04/14/2016  . CKD (chronic kidney disease), stage III ([place]) 04/14/2016  . Back pain 04/14/2016  . Iron deficiency anemia 08/15/2015  . OSA on CPAP 04/16/2015  . Medicare annual wellness visit, subsequent 04/14/2015  . RLS (restless legs syndrome) 03/20/2014  . Type 2 IDDM with complication [id number]/3419  . Medication management 09/12/2013  . Morbid obesity 05/14/2013  . Vitamin D deficiency 05/14/2013  . Dyslipidemia 02/24/2009  . Essential hypertension 02/24/2009  . Diverticulosis of large intestine 02/24/2009  . History of colonic polyps 02/24/2009    Screening Tests Immunization History  Administered Date(s) Administered  . DT 04/14/2015  . Hepatitis B 07/09/1999, 08/09/1999, 01/09/2000  . Influenza, High Dose Seasonal PF 03/20/2014  . Influenza,inj,quad, With Preservative 04/26/2016  . Influenza-Unspecified 03/28/2015, 04/14/2015, 02/14/2017  . Pneumococcal Conjugate-13 03/20/2014  . Pneumococcal Polysaccharide-23 10/15/2009  . Td 03/19/2004    Preventative care: Last colonoscopy: 2010 with Dr. Deatra Ina Last mammogram:  2012 Last pap smear/pelvic exam: remote [id number] [place] AB 2007 Echo 2017 EF 73-53%, diastolic grade 1 dysfunction.   Prior vaccinations: TD or Tdap: 2016  Influenza: 2017 Pneumococcal: 2011  Prevnar 13 2015 Shingles/Zostavax: declines due to cost  History reviewed: allergies, current medications, past family history, past medical history, past social history, past surgical history and problem list  Allergies Allergies  Allergen Reactions  . Brilinta [Ticagrelor] Shortness Of Breath and Other (See Comments)    Chest pain (also)  . Aspirin Other (See Comments)    Can tolerate in small doses (is already on a blood thinner AND has kidney disease)  . Nsaids Other (See Comments)    Patient is taking a blood thinner  and has kidney disease  . Minocycline Hcl Other (See Comments)    Welts  . Oruvail [Ketoprofen] Other (See Comments)    Has kidney disease and is taking a blood thinner  . Tricor [Fenofibrate]     Reaction unknown  . Vasotec [Enalaprilat]     Reaction unknown  . Zinc     Reaction unknown    SURGICAL HISTORY She  has a past surgical history that includes Total knee arthroplasty (Bilateral); Lumbar disc surgery (X 2); Appendectomy; Joint replacement; Back surgery; Melanoma excision (Left); Excision basal cell carcinoma (Left); Shoulder open rotator cuff repair (Right); Shoulder arthroscopy w/ rotator cuff repair (Left); Cataract extraction, bilateral (Bilateral); Cardiac catheterization (N/A, 04/15/2016); and Cardiac catheterization (N/A, 04/15/2016). FAMILY HISTORY Her family history includes AAA (abdominal aortic aneurysm) in her father; Heart disease in her mother and son; Kidney disease in her father. SOCIAL HISTORY She  reports that she quit smoking about 34 years ago. Her smoking use included cigarettes. She has a 62.50 pack-year smoking history. She has quit using smokeless tobacco. She reports that she does not drink alcohol or use drugs.  MEDICARE WELLNESS  OBJECTIVES: Physical activity:   Cardiac risk factors:   Depression/mood screen:   Depression screen [facility] 2/9 12/28/2016  Decreased Interest 0  Down, Depressed, Hopeless 0  PHQ - 2 Score 0    ADLs:  In your present state of health, do you have any difficulty performing the following activities: 12/28/2016 09/27/2016  Hearing? N N  Vision? N N  Difficulty concentrating or making decisions? N N  Walking or climbing stairs? N Y  Dressing or bathing? N N  Doing errands, shopping? N N  Some recent data might be hidden     Cognitive Testing  Alert? Yes  Normal Appearance?Yes  Oriented to person? Yes  Place? Yes   Time? Yes  Recall of three objects?  Yes  Can perform simple calculations? Yes  Displays appropriate judgment?Yes  Can read the correct time from a watch face?Yes  EOL planning:     Objective:   There were no vitals taken for this visit. There is no height or weight on file to calculate BMI.  General appearance: alert, no distress, WD/WN,  female HEENT: normocephalic, sclerae anicteric, TMs pearly, nares patent, no discharge or erythema, pharynx normal Oral cavity: MMM, no lesions Neck: supple, no lymphadenopathy, no thyromegaly, no masses Heart: RRR, normal S1, S2, no murmurs Lungs: CTA bilaterally, no wheezes, rhonchi, or rales Abdomen: +bs, soft, obese non tender, non distended, no masses, no hepatomegaly, no splenomegaly Musculoskeletal: nontender, no swelling, no obvious deformity Extremities: 1-2+ edema, no cyanosis, no clubbing Pulses: 2+ symmetric, upper and lower extremities, normal cap refill Neurological: alert, oriented x 3, CN2-12 intact, strength normal upper extremities and lower extremities, sensation decreased bilateral legs up to mid shin, DTRs 2+ throughout, no cerebellar signs, gait wide and antalgic Psychiatric: normal affect, behavior normal, pleasant  Breast: defer Gyn: defer Rectal: defer  Medicare Attestation I have personally  reviewed: The patient's medical and social history Their use of alcohol, tobacco or illicit drugs Their current medications and supplements The patient's functional ability including ADLs,fall risks, home safety risks, cognitive, and hearing and visual impairment Diet and physical activities Evidence for depression or mood disorders  The patient's weight, height, BMI, and visual acuity have been recorded in the chart.  I have made referrals, counseling, and provided education to the patient based on review of the above and I have provided the patient  with a written personalized care plan for preventive services.     Vicie Mutters, PA-C   06/14/2017

## 2017-06-16 ENCOUNTER — Encounter: Payer: Self-pay | Admitting: Physician Assistant

## 2017-06-22 ENCOUNTER — Other Ambulatory Visit: Payer: Self-pay | Admitting: Internal Medicine

## 2017-06-23 DIAGNOSIS — M5416 Radiculopathy, lumbar region: Secondary | ICD-10-CM | POA: Diagnosis not present

## 2017-06-27 NOTE — Progress Notes (Signed)
MEDICARE ANNUAL WELLNESS VISIT AND FOLLOW UP  Assessment:   Essential hypertension - continue medications, DASH diet, exercise and monitor at home. Call if greater than 130/80.  -     CBC with Differential/Platelet -     BASIC METABOLIC PANEL WITH GFR -     Hepatic function panel -     TSH  NSTEMI (non-ST elevated myocardial infarction) (HCC) Control blood pressure, cholesterol, glucose, increase exercise.   Hypertensive heart disease without heart failure - continue medications, DASH diet, exercise and monitor at home. Call if greater than 130/80.  -     BASIC METABOLIC PANEL WITH GFR  CAD in native artery Control blood pressure, cholesterol, glucose, increase exercise.   OSA on CPAP Sleep apnea- continue CPAP, weight loss advised.   Controlled type 2 diabetes mellitus with stage 3 chronic kidney disease, without long-term current use of insulin ([place]) Discussed general issues about diabetes pathophysiology and management., Educational material distributed., Suggested low cholesterol diet., Encouraged aerobic exercise., Discussed foot care., Reminded to get yearly retinal exam. Continue follow up Dr. Darnell Level NEED TO CHECK SUGAR DAILY AND BRING IN SUGAR LOG LONG DISCUSSION ABOUT FOOD -     BASIC METABOLIC PANEL WITH GFR  Uncontrolled type 2 diabetes mellitus with stage 3 chronic kidney disease, with long-term current use of insulin (HCC) Discussed general issues about diabetes pathophysiology and management., Educational material distributed., Suggested low cholesterol diet., Encouraged aerobic exercise., Discussed foot care., Reminded to get yearly retinal exam. -     BASIC METABOLIC PANEL WITH GFR  CKD (chronic kidney disease), stage III Increase fluids, avoid NSAIDS, monitor sugars, will monitor - may need referral nephrology -     BASIC METABOLIC PANEL WITH GFR  Hyperlipidemia -continue medications, check lipids, decrease fatty foods, increase activity.  -     Lipid  panel  Diverticulosis of large intestine without hemorrhage  History of colonic polyps  Morbid Obesity with co morbidities - long discussion about weight loss, diet, and exercise  Vitamin D deficiency -     VITAMIN D 25 Hydroxy (Vit-D Deficiency, Fractures)  Medication management -     Magnesium  RLS (restless legs syndrome) Check iron  BMI 38.0-38.9,adult - long discussion about weight loss, diet, and exercise  Medicare annual wellness visit, subsequent Declines MGM  Iron deficiency anemia, unspecified iron deficiency anemia type -     Iron and TIBC -     Ferritin -     Vitamin B12  Depression Cut wellbutrin in half, start on lexapro  BMI 40-44.9 - follow up 3 months for progress monitoring - increase veggies, decrease carbs - long discussion about weight loss, diet, and exercise   Future Appointments  Date Time Provider [place]  09/01/2017 11:00 AM Philemon Kingdom, MD [facility] None  09/05/2017 11:20 AM Skeet Latch, MD [facility]  06/29/2018 10:00 AM Vicie Mutters, PA-C [facility] None     Plan:   During the course of the visit the patient was educated and counseled about appropriate screening and preventive services including:    Pneumococcal vaccine   Influenza vaccine  Td vaccine  Screening electrocardiogram  Screening mammography  Bone densitometry screening  Colorectal cancer screening  Diabetes screening  Glaucoma screening  Nutrition counseling   Advanced directives: given info/requested  Subjective:   Janet Choi is a 77 y.o. female who presents for Medicare Annual Wellness Visit and 3 month follow up on hypertension,diabetes, hyperlipidemia, vitamin D def, poor compliance.   Her blood pressure has  been controlled at home, she is on verapamil, lasix, today [name] is BP: 120/68 She does not workout. She denies chest pain, shortness of breath, dizziness.  Following Dr. Berenice Primas for her lower back  pain left radicular pain and left arm, had ESI this past Thursday with Dr. Mina Marble, helping some.  She had NSTEMI with PCI LAD and LCx 04/2016, on plavix follows Cardio, Dr. Oval Linsey, off ASA on her own accord.  She is on cholesterol medication, lipitor and zetia denies myalgias. Her cholesterol is at goal. The cholesterol last visit was:   Lab Results  Component Value Date   CHOL 182 12/28/2016   HDL 39 (L) 12/28/2016   LDLCALC 73 09/27/2016   TRIG 289 (H) 12/28/2016   CHOLHDL 4.7 12/28/2016   She has been working on diet and exercise for diabetes, NOT on bASA not on ACE/ARB, she is on basaglar 70 units at night, and novolog 16-20 units with meals. She is not checking her sugars still. She will take her morning insulin but will forget to take her dinner time sugar, she does not eat lunch and does not take any insulin at lunch. She admits to not taking insulin as directed and not having a schedule for eating/sleeping.  She has one touch ultra. She is following Dr. 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 Last A1C in the office was:  Lab Results  Component Value Date   HGBA1C 8.7% 06/02/2017   Lab Results  Component Value Date   CREATININE 1.08 (H) 02/07/2017   BUN 15 02/07/2017   NA 137 02/07/2017   K 4.4 02/07/2017   CL 104 02/07/2017   CO2 26 02/07/2017   Last GFR, she does not see nephrology.  Lab Results  Component Value Date   GFRNONAA 50 (L) 02/07/2017   Patient is on Vitamin D supplement. Lab Results  Component Value Date   VD25OH 60 12/28/2016   BMI is Body mass index is 42.09 kg/m., she is working on diet and exercise and has done well. She states she just lost her best friend of 36 years, her sister (73 years old ) recently diagnosed with dementia. She has been depressed and worried about her memory. She states he is hungry all the time.  Wt Readings from Last 3 Encounters:   06/28/17 237 lb 9.6 oz (107.8 kg)  06/02/17 235 lb 9.6 oz (106.9 kg)  06/02/17 235 lb (106.6 kg)   Recent squamous cell removal from left leg.    Names of Other Physician/Practitioners you currently use: 1. [facility]- here for primary care 2. Dr. Satira Sark, eye doctor, last visit 02/2017 3. Dr. Luretha Rued, dentist, q 6 months 4. Dr. Lorin Mercy, spinal stenosis.  Patient Care Team: [name], MD as PCP - General (Internal Medicine) Marygrace Drought, MD as Consulting Physician (Ophthalmology) Inda Castle, MD (Inactive) as Consulting Physician (Gastroenterology) Larey Dresser, MD as Consulting Physician (Cardiology) Nobie Putnam, MD as Consulting Physician (Oncology) Tania Ade, MD as Consulting Physician (Orthopedic Surgery)  Medication Review Current Outpatient Medications on File Prior to Visit  Medication Sig Dispense Refill  . acetaminophen (TYLENOL) 325 MG tablet Take 2 tablets (650 mg total) by mouth every 4 (four) hours as needed for headache or mild pain.    Marland Kitchen allopurinol (ZYLOPRIM) 100 MG tablet Take 1 tablet daily to prevent Gout 90 tablet 3  .  aspirin EC 81 MG EC tablet Take 1 tablet (81 mg total) by mouth daily.    Marland Kitchen atorvastatin (LIPITOR) 80 MG tablet Take 1 tablet (80 mg total) by mouth daily at 6 PM. Need office visit 90 tablet 0  . buPROPion (WELLBUTRIN XL) 300 MG 24 hr tablet Take 1 tablet (300 mg total) daily by mouth. 90 tablet 1  . Cholecalciferol ([employer] VIT D 5000 HIGH-POTENCY) 5000 units capsule Take 5,000 Units by mouth 2 (two) times daily.     . clopidogrel (PLAVIX) 75 MG tablet Take 1 tablet (75 mg total) by mouth daily. 90 tablet 0  . colchicine 0.6 MG tablet Take 2 tablets first day of gout flare, then one a day until gout flare is better 90 tablet 1  . ezetimibe (ZETIA) 10 MG tablet Take 1 tablet (10 mg total) by mouth daily. 90 tablet 3  . FLECTOR 1.3 % PTCH APPLY 1 PATCH EVERY 12 HOURS AS NEEDED THEN OFF 12 HOURS  30 patch 0  . furosemide (LASIX) 40 MG tablet Take 1 tablet 3 x / day for fluid retention 270 tablet 1  . gabapentin (NEURONTIN) 600 MG tablet TAKE ONE-HALF TO ONE TABLET BY MOUTH THREE TIMES DAILY FOR PAIN. 270 tablet 1  . glucose blood (ONETOUCH VERIO) test strip Use as instructed to check sugar 2 times daily. 200 each 5  . HYDROcodone-acetaminophen (NORCO/VICODIN) 5-325 MG tablet Take 1 tablet by mouth every 6 (six) hours as needed for moderate pain.    Marland Kitchen insulin aspart (NOVOLOG FLEXPEN) 100 UNIT/ML FlexPen Inject 16-20 Units into the skin 3 (three) times daily with meals. (Patient taking differently: Inject 16-20 Units into the skin 2 (two) times daily before a meal. ) 10 pen 11  . Insulin Glargine (BASAGLAR KWIKPEN) 100 UNIT/ML SOPN INJECT 70 UNITS INTO THE SKIN AT BEDTIME. 75 pen 0  . Insulin Pen Needle (CAREFINE PEN NEEDLES) 32G X 4 MM MISC Use 4x a day 300 each 11  . Lancets (ONETOUCH ULTRASOFT) lancets Use as instructed 100 each 12  . losartan (COZAAR) 50 MG tablet TAKE 1 TABLET BY MOUTH EVERY DAY 90 tablet 1  . nitroGLYCERIN (NITROSTAT) 0.4 MG SL tablet Place 1 tablet (0.4 mg total) under the tongue every 5 (five) minutes x 3 doses as needed for chest pain. 25 tablet 4  . rOPINIRole (REQUIP) 3 MG tablet Take 1 tablet (3 mg total) 4 (four) times daily by mouth. 360 tablet 1  . tiZANidine (ZANAFLEX) 4 MG tablet Take 4 mg by mouth every 6 (six) hours as needed for muscle spasms.     No current facility-administered medications on file prior to visit.     Current Problems (verified) Patient Active Problem List   Diagnosis Date Noted  . DJD (degenerative joint disease), lumbosacral 06/02/2017  . Anxiety 04/19/2016  . CAD S/P percutaneous coronary angioplasty   . History of non-ST elevation myocardial infarction (NSTEMI) 04/14/2016  . CKD (chronic kidney disease), stage III ([place]) 04/14/2016  . Iron deficiency anemia 08/15/2015  . OSA on CPAP 04/16/2015  . RLS (restless legs syndrome)  03/20/2014  . Type 2 IDDM with complication 44/05/270  . Medication management 09/12/2013  . Morbid obesity 05/14/2013  . Vitamin D deficiency 05/14/2013  . Dyslipidemia 02/24/2009  . Essential hypertension 02/24/2009  . Diverticulosis of large intestine 02/24/2009  . History of colonic polyps 02/24/2009    Screening Tests Immunization History  Administered Date(s) Administered  . DT 04/14/2015  . Hepatitis B 07/09/1999, 08/09/1999,  01/09/2000  . Influenza, High Dose Seasonal PF 03/20/2014  . Influenza,inj,quad, With Preservative 04/26/2016  . Influenza-Unspecified 03/28/2015, 04/14/2015, 02/14/2017  . Pneumococcal Conjugate-13 03/20/2014  . Pneumococcal Polysaccharide-23 10/15/2009  . Td 03/19/2004    Preventative care: Last colonoscopy: 2010 with Dr. Deatra Ina Last mammogram: 2012 OVERDUE Last pap smear/pelvic exam: remote [id number] [place] AB 2007 Echo 2017 EF 51-76%, diastolic grade 1 dysfunction.   Prior vaccinations: TD or Tdap: 2016  Influenza: 2018 Pneumococcal: 2011  Prevnar 13 2015 Shingles/Zostavax: declines due to cost  History reviewed: allergies, current medications, past family history, past medical history, past social history, past surgical history and problem list  Allergies Allergies  Allergen Reactions  . Brilinta [Ticagrelor] Shortness Of Breath and Other (See Comments)    Chest pain (also)  . Aspirin Other (See Comments)    Can tolerate in small doses (is already on a blood thinner AND has kidney disease)  . Nsaids Other (See Comments)    Patient is taking a blood thinner and has kidney disease  . Minocycline Hcl Other (See Comments)    Welts  . Oruvail [Ketoprofen] Other (See Comments)    Has kidney disease and is taking a blood thinner  . Tricor [Fenofibrate]     Reaction unknown  . Vasotec [Enalaprilat]     Reaction unknown  . Zinc     Reaction unknown    SURGICAL HISTORY She  has a past surgical history that includes Total knee  arthroplasty (Bilateral); Lumbar disc surgery (X 2); Appendectomy; Joint replacement; Back surgery; Melanoma excision (Left); Excision basal cell carcinoma (Left); Shoulder open rotator cuff repair (Right); Shoulder arthroscopy w/ rotator cuff repair (Left); Cataract extraction, bilateral (Bilateral); Cardiac catheterization (N/A, 04/15/2016); and Cardiac catheterization (N/A, 04/15/2016). FAMILY HISTORY Her family history includes AAA (abdominal aortic aneurysm) in her father; Heart disease in her mother and son; Kidney disease in her father. SOCIAL HISTORY She  reports that she quit smoking about 34 years ago. Her smoking use included cigarettes. She has a 62.50 pack-year smoking history. She has quit using smokeless tobacco. She reports that she does not drink alcohol or use drugs.  MEDICARE WELLNESS OBJECTIVES: Physical activity: Current Exercise Habits: The patient does not participate in regular exercise at present(will clean her house), Exercise limited by: orthopedic condition(s) Cardiac risk factors: Cardiac Risk Factors include: advanced age (>91men, >19 women);diabetes mellitus;dyslipidemia;family history of premature cardiovascular disease;hypertension;obesity (BMI >30kg/m2);sedentary lifestyle Depression/mood screen:   Depression screen [facility] 2/9 06/28/2017  Decreased Interest 0  Down, Depressed, Hopeless 0  PHQ - 2 Score 0    ADLs:  In your present state of health, do you have any difficulty performing the following activities: 06/28/2017 12/28/2016  Hearing? N N  Vision? N N  Difficulty concentrating or making decisions? N N  Walking or climbing stairs? Y N  Dressing or bathing? N N  Doing errands, shopping? N N  Some recent data might be hidden     Cognitive Testing  Alert? Yes  Normal Appearance?Yes  Oriented to person? Yes  Place? Yes   Time? Yes  Recall of three objects?  Yes  Can perform simple calculations? Yes  Displays appropriate judgment?Yes  Can read the correct  time from a watch face?Yes  EOL planning: Does Patient Have a Medical Advance Directive?: No Would patient like information on creating a medical advance directive?: Yes (MAU/Ambulatory/Procedural Areas - Information given)   Objective:   Blood pressure 120/68, pulse 78, temperature 98.1 F (36.7 C), resp. rate 16, height 5'  3" (1.6 m), weight 237 lb 9.6 oz (107.8 kg), SpO2 94 %. Body mass index is 42.09 kg/m.  General appearance: alert, no distress, WD/WN,  female HEENT: normocephalic, sclerae anicteric, TMs pearly, nares patent, no discharge or erythema, pharynx normal Oral cavity: MMM, no lesions Neck: supple, no lymphadenopathy, no thyromegaly, no masses Heart: RRR, normal S1, S2, no murmurs Lungs: CTA bilaterally, no wheezes, rhonchi, or rales Abdomen: +bs, soft, obese non tender, non distended, no masses, no hepatomegaly, no splenomegaly Musculoskeletal: nontender, no swelling, no obvious deformity Extremities: 1-2+ edema, no cyanosis, no clubbing Pulses: 2+ symmetric, upper and lower extremities, normal cap refill Neurological: alert, oriented x 3, CN2-12 intact, strength normal upper extremities and lower extremities, sensation decreased bilateral legs up to mid shin, DTRs 2+ throughout, no cerebellar signs, gait wide and antalgic Psychiatric: normal affect, behavior normal, pleasant  Breast: defer Gyn: defer Rectal: defer  Medicare Attestation I have personally reviewed: The patient's medical and social history Their use of alcohol, tobacco or illicit drugs Their current medications and supplements The patient's functional ability including ADLs,fall risks, home safety risks, cognitive, and hearing and visual impairment Diet and physical activities Evidence for depression or mood disorders  The patient's weight, height, BMI, and visual acuity have been recorded in the chart.  I have made referrals, counseling, and provided education to the patient based on review of the  above and I have provided the patient with a written personalized care plan for preventive services.     Vicie Mutters, PA-C   06/28/2017

## 2017-06-28 ENCOUNTER — Telehealth: Payer: Self-pay

## 2017-06-28 ENCOUNTER — Ambulatory Visit (INDEPENDENT_AMBULATORY_CARE_PROVIDER_SITE_OTHER): Payer: PPO | Admitting: Physician Assistant

## 2017-06-28 ENCOUNTER — Encounter: Payer: Self-pay | Admitting: Physician Assistant

## 2017-06-28 VITALS — BP 120/68 | HR 78 | Temp 98.1°F | Resp 16 | Ht 63.0 in | Wt 237.6 lb

## 2017-06-28 DIAGNOSIS — I10 Essential (primary) hypertension: Secondary | ICD-10-CM | POA: Diagnosis not present

## 2017-06-28 DIAGNOSIS — Z79899 Other long term (current) drug therapy: Secondary | ICD-10-CM

## 2017-06-28 DIAGNOSIS — D509 Iron deficiency anemia, unspecified: Secondary | ICD-10-CM

## 2017-06-28 DIAGNOSIS — Z86010 Personal history of colonic polyps: Secondary | ICD-10-CM

## 2017-06-28 DIAGNOSIS — F419 Anxiety disorder, unspecified: Secondary | ICD-10-CM

## 2017-06-28 DIAGNOSIS — I2510 Atherosclerotic heart disease of native coronary artery without angina pectoris: Secondary | ICD-10-CM

## 2017-06-28 DIAGNOSIS — G2581 Restless legs syndrome: Secondary | ICD-10-CM

## 2017-06-28 DIAGNOSIS — E785 Hyperlipidemia, unspecified: Secondary | ICD-10-CM | POA: Diagnosis not present

## 2017-06-28 DIAGNOSIS — M47817 Spondylosis without myelopathy or radiculopathy, lumbosacral region: Secondary | ICD-10-CM

## 2017-06-28 DIAGNOSIS — Z0001 Encounter for general adult medical examination with abnormal findings: Secondary | ICD-10-CM | POA: Diagnosis not present

## 2017-06-28 DIAGNOSIS — E118 Type 2 diabetes mellitus with unspecified complications: Secondary | ICD-10-CM

## 2017-06-28 DIAGNOSIS — R6889 Other general symptoms and signs: Secondary | ICD-10-CM | POA: Diagnosis not present

## 2017-06-28 DIAGNOSIS — I252 Old myocardial infarction: Secondary | ICD-10-CM | POA: Diagnosis not present

## 2017-06-28 DIAGNOSIS — E559 Vitamin D deficiency, unspecified: Secondary | ICD-10-CM

## 2017-06-28 DIAGNOSIS — G4733 Obstructive sleep apnea (adult) (pediatric): Secondary | ICD-10-CM

## 2017-06-28 DIAGNOSIS — F3341 Major depressive disorder, recurrent, in partial remission: Secondary | ICD-10-CM

## 2017-06-28 DIAGNOSIS — Z6841 Body Mass Index (BMI) 40.0 and over, adult: Secondary | ICD-10-CM

## 2017-06-28 DIAGNOSIS — Z794 Long term (current) use of insulin: Secondary | ICD-10-CM

## 2017-06-28 DIAGNOSIS — K5730 Diverticulosis of large intestine without perforation or abscess without bleeding: Secondary | ICD-10-CM

## 2017-06-28 DIAGNOSIS — N183 Chronic kidney disease, stage 3 unspecified: Secondary | ICD-10-CM

## 2017-06-28 DIAGNOSIS — M5137 Other intervertebral disc degeneration, lumbosacral region: Secondary | ICD-10-CM

## 2017-06-28 DIAGNOSIS — Z9861 Coronary angioplasty status: Secondary | ICD-10-CM

## 2017-06-28 DIAGNOSIS — Z9989 Dependence on other enabling machines and devices: Secondary | ICD-10-CM

## 2017-06-28 DIAGNOSIS — Z0000 Encounter for general adult medical examination without abnormal findings: Secondary | ICD-10-CM

## 2017-06-28 MED ORDER — BUPROPION HCL ER (XL) 150 MG PO TB24: 150.0000 mg | ORAL_TABLET | Freq: Every day | ORAL | 0 refills | Status: DC

## 2017-06-28 MED ORDER — ESCITALOPRAM OXALATE 10 MG PO TABS: 10.0000 mg | ORAL_TABLET | Freq: Every day | ORAL | 2 refills | Status: DC

## 2017-06-28 NOTE — Patient Instructions (Addendum)
VERY VERY IMPORTANT TO CHECK YOUR SUGARS AT LEAST 2 A DAY AND BRING IN TO EVERY VISIT NEED TO TRY TO KEEP A MORE CONSISTENT WITH SCHEDULE  Get on allergy pill for your hoarseness, take zyrtec at night for sleep  Cut back on the wellbutrin to 1/2 pill a day And start on lexapro 10 mg a day  NO SWEET TEA, NO REGULAR SODA.   Mini Habits for weight loss- get this book for weight loss  Diet soda leads to weight gain.  We recently discovered that the artifical sugar in the soda stops an enzyme in your stomach that is suppose to signal that your brain is full. So patients that drink a lot of diet soda will never feel full and tend to over eat. So please cut back on diet soda and it can help with your weight loss.   Are you an emotional eater? Do you eat more when you're feeling stressed? Do you eat when you're not hungry or when you're full? Do you eat to feel better (to calm and soothe yourself when you're sad, mad, bored, anxious, etc.)? Do you reward yourself with food? Do you regularly eat until you've stuffed yourself? Does food make you feel safe? Do you feel like food is a friend? Do you feel powerless or out of control around food?  If you answered yes to some of these questions than it is likely that you are an emotional eater. This is normally a learned behavior and can take time to first recognize the signs and second BREAK THE HABIT. But here is more information and tips to help.   The difference between emotional hunger and physical hunger Emotional hunger can be powerful, so it's easy to mistake it for physical hunger. But there are clues you can look for to help you tell physical and emotional hunger apart.  Emotional hunger comes on suddenly. It hits you in an instant and feels overwhelming and urgent. Physical hunger, on the other hand, comes on more gradually. The urge to eat doesn't feel as dire or demand instant satisfaction (unless you haven't eaten for a very long  time).  Emotional hunger craves specific comfort foods. When you're physically hungry, almost anything sounds good-including healthy stuff like vegetables. But emotional hunger craves junk food or sugary snacks that provide an instant rush. You feel like you need cheesecake or pizza, and nothing else will do.  Emotional hunger often leads to mindless eating. Before you know it, you've eaten a whole bag of chips or an entire pint of ice cream without really paying attention or fully enjoying it. When you're eating in response to physical hunger, you're typically more aware of what you're doing.  Emotional hunger isn't satisfied once you're full. You keep wanting more and more, often eating until you're uncomfortably stuffed. Physical hunger, on the other hand, doesn't need to be stuffed. You feel satisfied when your stomach is full.  Emotional hunger isn't located in the stomach. Rather than a growling belly or a pang in your stomach, you feel your hunger as a craving you can't get out of your head. You're focused on specific textures, tastes, and smells.  Emotional hunger often leads to regret, guilt, or shame. When you eat to satisfy physical hunger, you're unlikely to feel guilty or ashamed because you're simply giving your body what it needs. If you feel guilty after you eat, it's likely because you know deep down that you're not eating for nutritional reasons.  Identify your emotional eating triggers What situations, places, or feelings make you reach for the comfort of food? Most emotional eating is linked to unpleasant feelings, but it can also be triggered by positive emotions, such as rewarding yourself for achieving a goal or celebrating a holiday or happy event. Common causes of emotional eating include:  Stuffing emotions - Eating can be a way to temporarily silence or "stuff down" uncomfortable emotions, including anger, fear, sadness, anxiety, loneliness, resentment, and shame. While  you're numbing yourself with food, you can avoid the difficult emotions you'd rather not feel.  Boredom or feelings of emptiness - Do you ever eat simply to give yourself something to do, to relieve boredom, or as a way to fill a void in your life? You feel unfulfilled and empty, and food is a way to occupy your mouth and your time. In the moment, it fills you up and distracts you from underlying feelings of purposelessness and dissatisfaction with your life.  Childhood habits - Think back to your childhood memories of food. Did your parents reward good behavior with ice cream, take you out for pizza when you got a good report card, or serve you sweets when you were feeling sad? These habits can often carry over into adulthood. Or your eating may be driven by nostalgia-for cherished memories of grilling burgers in the backyard with your dad or baking and eating cookies with your mom.  Social influences - Getting together with other people for a meal is a great way to relieve stress, but it can also lead to overeating. It's easy to overindulge simply because the food is there or because everyone else is eating. You may also overeat in social situations out of nervousness. Or perhaps your family or circle of friends encourages you to overeat, and it's easier to go along with the group.  Stress - Ever notice how stress makes you hungry? It's not just in your mind. When stress is chronic, as it so often is in our chaotic, fast-paced world, your body produces high levels of the stress hormone, cortisol. Cortisol triggers cravings for salty, sweet, and fried foods-foods that give you a burst of energy and pleasure. The more uncontrolled stress in your life, the more likely you are to turn to food for emotional relief.  Find other ways to feed your feelings If you don't know how to manage your emotions in a way that doesn't involve food, you won't be able to control your eating habits for very long. Diets so  often fail because they offer logical nutritional advice which only works if you have conscious control over your eating habits. It doesn't work when emotions hijack the process, demanding an immediate payoff with food.  In order to stop emotional eating, you have to find other ways to fulfill yourself emotionally. It's not enough to understand the cycle of emotional eating or even to understand your triggers, although that's a huge first step. You need alternatives to food that you can turn to for emotional fulfillment.  Alternatives to emotional eating If you're depressed or lonely, call someone who always makes you feel better, play with your dog or cat, or look at a favorite photo or cherished memento.  If you're anxious, expend your nervous energy by dancing to your favorite song, squeezing a stress ball, or taking a brisk walk.  If you're exhausted, treat yourself with a hot cup of tea, take a bath, light some scented candles, or wrap yourself in a  warm blanket.  If you're bored, read a good book, watch a comedy show, explore the outdoors, or turn to an activity you enjoy (woodworking, playing the guitar, shooting hoops, scrapbooking, etc.).  What is mindful eating? Mindful eating is a practice that develops your awareness of eating habits and allows you to pause between your triggers and your actions. Most emotional eaters feel powerless over their food cravings. When the urge to eat hits, you feel an almost unbearable tension that demands to be fed, right now. Because you've tried to resist in the past and failed, you believe that your willpower just isn't up to snuff. But the truth is that you have more power over your cravings than you think.  Take 5 before you give in to a craving Emotional eating tends to be automatic and virtually mindless. Before you even realize what you're doing, you've reached for a tub of ice cream and polished off half of it. But if you can take a moment to pause  and reflect when you're hit with a craving, you give yourself the opportunity to make a different decision.  Can you put off eating for five minutes? Or just start with one minute. Don't tell yourself you can't give in to the craving; remember, the forbidden is extremely tempting. Just tell yourself to wait.  While you're waiting, check in with yourself. How are you feeling? What's going on emotionally? Even if you end up eating, you'll have a better understanding of why you did it. This can help you set yourself up for a different response next time.  How to practice mindful eating Eating while you're also doing other things-such as watching TV, driving, or playing with your phone-can prevent you from fully enjoying your food. Since your mind is elsewhere, you may not feel satisfied or continue eating even though you're no longer hungry. Eating more mindfully can help focus your mind on your food and the pleasure of a meal and curb overeating.   Eat your meals in a calm place with no distractions, aside from any dining companions.  Try eating with your non-dominant hand or using chopsticks instead of a knife and fork. Eating in such a non-familiar way can slow down how fast you eat and ensure your mind stays focused on your food.  Allow yourself enough time not to have to rush your meal. Set a timer for 20 minutes and pace yourself so you spend at least that much time eating.  Take small bites and chew them well, taking time to notice the different flavors and textures of each mouthful.  Put your utensils down between bites. Take time to consider how you feel-hungry, satiated-before picking up your utensils again.  Try to stop eating before you are full.It takes time for the signal to reach your brain that you've had enough. Don't feel obligated to always clean your plate.  When you've finished your food, take a few moments to assess if you're really still hungry before opting for an extra  serving or dessert.  Learn to accept your feelings-even the bad ones  While it may seem that the core problem is that you're powerless over food, emotional eating actually stems from feeling powerless over your emotions. You don't feel capable of dealing with your feelings head on, so you avoid them with food.  Recommended reading  Healthy Eating: A guide to the new nutrition - [place] Report  10 Tips for Mindful Eating - How mindfulness  can help you fully enjoy a meal and the experience of eating-with moderation and restraint. ([place])  Weight Loss: Gain Control of Emotional Eating - Tips to regain control of your eating habits. [facility])  Why Stress Causes People to Overeat -Tips on controlling stress eating. ([place])  Mindful Eating Meditations -Free online mindfulness meditations. ([facility])    Dining out: help on how to choose  It is better if you can meal plan and eat at your house but sometimes life happens so here are some tips to help you make healthier choices while eating out:  1) Ask for your server to pack up 1/2 of your meal to take home for lunch or later. Portion sizes are huge so if you don't have all of it in front of you, you are less likely to eat it all.    2) Ask if they have a smaller portion or lunch portion available, this is often cheaper as well!  3) Ask to not have the bread basket brought to the table  4) Ask for the dressing on the side.   5) Look at the nutrition menu BEFORE you go to a restaurant or fast food chain and have what you will eat in mind. You can also look it up on food tracking ups like Myfitness Pal or Lost it. However the web site for the restaurant is usually the best bet.   6) Don't forget that you are the costumer, it is okay to ask them to leave things out, ask about substituting, or ask them to cook with less oil!  Here is some more general  information for you!     Counseling services  Here are some numbers below you can try but I suggest calling your insurance and finding out who is in your network and THEN calling those people or looking them up on google.   I'm a big fan of Cognitive Behavioral Therapy, look this up on You tube or check with the therapist you see if they are certified.  This form of therapy helps to teach you skills to better handle with current situation that are causing anxiety or depression.     Vascular Dementia Dementia is a condition in which a person has problems with thinking, memory, and behavior that are severe enough to interfere with daily life. Vascular dementia is a type of dementia. It results from brain damage that is caused by the brain not getting enough blood. Vascular dementia usually begins between 7 and 73 years of age. What are the causes? Vascular dementia is caused by conditions that lessen blood flow to the brain. Common causes include:  Multiple small strokes. These may happen without symptoms (silent stroke).  Major stroke.  Damage to small blood vessels in the brain (cerebral small vessel disease).  What increases the risk?  Advancing age.  Having had a stroke.  Having high blood pressure (hypertension) or high cholesterol.  Having a disease that affects the heart or blood vessels.  Smoking.  Having diabetes.  Being female.  Being obese.  Not being active.  Having depression. What are the signs or symptoms? Symptoms can vary a lot from one person to another. Symptoms may be mild or severe depending on the amount of damage and which parts of the brain have been affected. Symptoms may begin suddenly or may develop gradually. Symptoms may remain stable, or they may get worse over time. Symptoms of vascular dementia may  be similar to those of Alzheimer disease. The two conditions can occur together (mixed dementia). Symptoms of vascular dementia may  include: Mental  Confusion.  Memory problems.  Poor attention and concentration.  Trouble understanding speech.  Depression.  Personality changes.  Trouble recognizing familiar people.  Agitation or aggression.  Paranoia.  Delusions or hallucinations. Physical  Weakness.  Poor balance.  Loss of bladder or bowel control (incontinence).  Unsteady walking (gait).  Speaking problems. Behavioral  Getting lost in familiar places.  Problems with planning and judgment.  Trouble following instructions.  Social problems.  Emotional outbursts.  Trouble with daily activities and self-care.  Problems handling money. How is this diagnosed? There is not a specific test to diagnose vascular dementia. The health care provider will consider the person's medical history and symptoms or changes that are reported by friends and family. The health care provider will do a physical exam and may order lab tests or other tests that check brain and nervous system function. Tests that may be done include:  Blood tests.  Brain imaging tests.  Tests of movement, speech, and other daily activities (neurological exam).  Tests of memory, thinking, and problem-solving (neuropsychological or neurocognitive testing).  Diagnosis may involve several specialists. These may include a health care provider who specializes in the brain and nervous system (neurologist), a provider who specializes in disorders of the mind (psychiatrist), and a provider who focuses on speech and language changes (Electrical engineer). How is this treated? There is no cure for vascular dementia. Brain damage that has already occurred cannot be reversed. Treatment depends on:  How severe the condition is.  Which parts of the brain have been affected.  The person's overall health.  Treatment measures aim to:  Treat the underlying cause of vascular dementia and manage risk factors. This may  include: ? Controlling blood pressure. ? Lowering cholesterol. ? Treating diabetes. ? Quitting smoking. ? Losing weight.  Manage symptoms.  Prevent further brain damage.  Improve the person's health and quality of life.  Treatment for dementia may involve a team of health care providers, including:  A neurologist.  A psychiatrist.  An occupational therapist.  A speech pathologist.  A cardiologist.  An exercise physiologist or physical therapist.  Follow these instructions at home: Home care for a person with vascular dementia depends on what caused the condition and how severe the symptoms are. General guidelines for care at home include:  Following the health care provider's instructions for treating the condition that caused the dementia.  Using medicines only as told by the person's health care provider.  Creating a safe living space.  Learning ways to help the person remember people, appointments, and daily activities.  Finding a support group to help caregivers and family to cope with the effects of dementia.  Helping family and friends learn about ways to communicate with a person who has dementia.  Making sure the person keeps all follow-up visits and goes to all rehabilitation appointments as told by the health care team. This is important.  Contact a health care provider if:  A fever develops.  New behavioral problems develop.  Problems with swallowing develop.  Confusion gets worse.  Sleepiness gets worse. Get help right away if:  Loss of consciousness occurs.  There is a sudden loss of speech, balance, or thinking ability.  New numbness or paralysis occurs.  Sudden, severe headache occurs.  Vision is lost or suddenly gets worse in one or both eyes. This information is not  intended to replace advice given to you by your health care provider. Make sure you discuss any questions you have with your health care provider. Document Released:  04/16/2002 Document Revised: 10/02/2015 Document Reviewed: 08/07/2014 Elsevier Interactive Patient Education  2018 [employer].

## 2017-06-28 NOTE — Telephone Encounter (Signed)
Per provider its ok to fill escitalopram Feb 19th 2019

## 2017-06-29 LAB — LIPID PANEL
CHOLESTEROL: 142 mg/dL (ref <200)
HDL: 38 mg/dL — ABNORMAL LOW (ref >50)
LDL CHOLESTEROL (CALC): 66 mg/dL
NON-HDL CHOLESTEROL (CALC): 104 mg/dL (ref <130)
TRIGLYCERIDES: 315 mg/dL — AB (ref <150)
Total CHOL/HDL Ratio: 3.7 (calc) (ref <5.0)

## 2017-06-29 LAB — TSH: TSH: 1.15 m[IU]/L (ref 0.40–4.50)

## 2017-06-29 LAB — CBC WITH DIFFERENTIAL/PLATELET
Basophils Absolute: 11 cells/uL (ref 0–200)
Basophils Relative: 0.2 %
EOS PCT: 2 %
Eosinophils Absolute: 110 cells/uL (ref 15–500)
HCT: 33.3 % — ABNORMAL LOW (ref 35.0–45.0)
HEMOGLOBIN: 10.6 g/dL — AB (ref 11.7–15.5)
LYMPHS ABS: 1111 {cells}/uL (ref 850–3900)
MCH: 25.9 pg — ABNORMAL LOW (ref 27.0–33.0)
MCHC: 31.8 g/dL — ABNORMAL LOW (ref 32.0–36.0)
MCV: 81.2 fL (ref 80.0–100.0)
MONOS PCT: 4 %
MPV: 10.6 fL (ref 7.5–12.5)
NEUTROS ABS: 4048 {cells}/uL (ref 1500–7800)
Neutrophils Relative %: 73.6 %
Platelets: 228 10*3/uL (ref 140–400)
RBC: 4.1 10*6/uL (ref 3.80–5.10)
RDW: 16.6 % — ABNORMAL HIGH (ref 11.0–15.0)
Total Lymphocyte: 20.2 %
WBC mixed population: 220 cells/uL (ref 200–950)
WBC: 5.5 10*3/uL (ref 3.8–10.8)

## 2017-06-29 LAB — BASIC METABOLIC PANEL WITH GFR
BUN / CREAT RATIO: 20 (calc) (ref 6–22)
BUN: 31 mg/dL — AB (ref 7–25)
CALCIUM: 9.3 mg/dL (ref 8.6–10.4)
CHLORIDE: 100 mmol/L (ref 98–110)
CO2: 29 mmol/L (ref 20–32)
CREATININE: 1.52 mg/dL — AB (ref 0.60–0.93)
GFR, Est African American: 38 mL/min/{1.73_m2} — ABNORMAL LOW (ref >=60)
GFR, Est Non African American: 33 mL/min/{1.73_m2} — ABNORMAL LOW (ref >=60)
GLUCOSE: 235 mg/dL — AB (ref 65–99)
Potassium: 4.3 mmol/L (ref 3.5–5.3)
Sodium: 141 mmol/L (ref 135–146)

## 2017-06-29 LAB — HEPATIC FUNCTION PANEL
AG RATIO: 1.8 (calc) (ref 1.0–2.5)
ALBUMIN MSPROF: 4.1 g/dL (ref 3.6–5.1)
ALT: 10 U/L (ref 6–29)
AST: 10 U/L (ref 10–35)
Alkaline phosphatase (APISO): 98 U/L (ref 33–130)
BILIRUBIN INDIRECT: 0.4 mg/dL (ref 0.2–1.2)
Bilirubin, Direct: 0.1 mg/dL (ref 0.0–0.2)
GLOBULIN: 2.3 g/dL (ref 1.9–3.7)
TOTAL PROTEIN: 6.4 g/dL (ref 6.1–8.1)
Total Bilirubin: 0.5 mg/dL (ref 0.2–1.2)

## 2017-06-29 LAB — IRON, TOTAL/TOTAL IRON BINDING CAP
%SAT: 11 % (calc) (ref 11–50)
Iron: 51 ug/dL (ref 45–160)
TIBC: 473 mcg/dL (calc) — ABNORMAL HIGH (ref 250–450)

## 2017-06-29 LAB — FERRITIN: FERRITIN: 12 ng/mL — AB (ref 20–288)

## 2017-06-29 LAB — VITAMIN B12: Vitamin B-12: 207 pg/mL (ref 200–1100)

## 2017-06-29 LAB — MAGNESIUM: Magnesium: 2.1 mg/dL (ref 1.5–2.5)

## 2017-06-29 LAB — VITAMIN D 25 HYDROXY (VIT D DEFICIENCY, FRACTURES): Vit D, 25-Hydroxy: 43 ng/mL (ref 30–100)

## 2017-07-02 ENCOUNTER — Other Ambulatory Visit: Payer: Self-pay | Admitting: Internal Medicine

## 2017-07-11 DIAGNOSIS — M7072 Other bursitis of hip, left hip: Secondary | ICD-10-CM | POA: Diagnosis not present

## 2017-07-25 ENCOUNTER — Other Ambulatory Visit: Payer: Self-pay

## 2017-07-25 MED ORDER — ESCITALOPRAM OXALATE 10 MG PO TABS: 10.0000 mg | ORAL_TABLET | Freq: Every day | ORAL | 3 refills | Status: DC

## 2017-07-27 ENCOUNTER — Ambulatory Visit: Payer: Self-pay

## 2017-07-28 ENCOUNTER — Ambulatory Visit: Payer: Self-pay

## 2017-08-02 ENCOUNTER — Ambulatory Visit (INDEPENDENT_AMBULATORY_CARE_PROVIDER_SITE_OTHER): Payer: PPO

## 2017-08-02 DIAGNOSIS — M7072 Other bursitis of hip, left hip: Secondary | ICD-10-CM | POA: Diagnosis not present

## 2017-08-02 DIAGNOSIS — Z79899 Other long term (current) drug therapy: Secondary | ICD-10-CM | POA: Diagnosis not present

## 2017-08-02 LAB — CBC WITH DIFFERENTIAL/PLATELET
BASOS ABS: 21 {cells}/uL (ref 0–200)
Basophils Relative: 0.5 %
Eosinophils Absolute: 152 cells/uL (ref 15–500)
Eosinophils Relative: 3.7 %
HEMATOCRIT: 34.6 % — AB (ref 35.0–45.0)
HEMOGLOBIN: 11.3 g/dL — AB (ref 11.7–15.5)
LYMPHS ABS: 1107 {cells}/uL (ref 850–3900)
MCH: 28.3 pg (ref 27.0–33.0)
MCHC: 32.7 g/dL (ref 32.0–36.0)
MCV: 86.5 fL (ref 80.0–100.0)
MPV: 10.3 fL (ref 7.5–12.5)
Monocytes Relative: 4.7 %
NEUTROS PCT: 64.1 %
Neutro Abs: 2628 cells/uL (ref 1500–7800)
Platelets: 187 10*3/uL (ref 140–400)
RBC: 4 10*6/uL (ref 3.80–5.10)
RDW: 17.3 % — AB (ref 11.0–15.0)
Total Lymphocyte: 27 %
WBC: 4.1 10*3/uL (ref 3.8–10.8)
WBCMIX: 193 {cells}/uL — AB (ref 200–950)

## 2017-08-02 LAB — BASIC METABOLIC PANEL WITH GFR
BUN / CREAT RATIO: 14 (calc) (ref 6–22)
BUN: 18 mg/dL (ref 7–25)
CO2: 28 mmol/L (ref 20–32)
CREATININE: 1.3 mg/dL — AB (ref 0.60–0.93)
Calcium: 9.6 mg/dL (ref 8.6–10.4)
Chloride: 104 mmol/L (ref 98–110)
GFR, EST AFRICAN AMERICAN: 46 mL/min/{1.73_m2} — AB (ref >=60)
GFR, Est Non African American: 40 mL/min/{1.73_m2} — ABNORMAL LOW (ref >=60)
Glucose, Bld: 148 mg/dL — ABNORMAL HIGH (ref 65–99)
Potassium: 4.6 mmol/L (ref 3.5–5.3)
Sodium: 142 mmol/L (ref 135–146)

## 2017-08-02 NOTE — Progress Notes (Signed)
Patient presents to the office for a nurse visit, lab only. Patient states that the only changes is her weight gain. Her insulin has changed and all she wants to do is eat. No medication changes at this time.

## 2017-08-16 ENCOUNTER — Encounter: Payer: Self-pay | Admitting: Physician Assistant

## 2017-08-16 ENCOUNTER — Ambulatory Visit (INDEPENDENT_AMBULATORY_CARE_PROVIDER_SITE_OTHER): Payer: PPO | Admitting: Physician Assistant

## 2017-08-16 VITALS — BP 118/76 | HR 84 | Temp 97.5°F | Ht 63.0 in

## 2017-08-16 DIAGNOSIS — L03012 Cellulitis of left finger: Secondary | ICD-10-CM | POA: Diagnosis not present

## 2017-08-16 DIAGNOSIS — Z794 Long term (current) use of insulin: Secondary | ICD-10-CM | POA: Diagnosis not present

## 2017-08-16 DIAGNOSIS — E118 Type 2 diabetes mellitus with unspecified complications: Secondary | ICD-10-CM | POA: Diagnosis not present

## 2017-08-16 DIAGNOSIS — L03114 Cellulitis of left upper limb: Secondary | ICD-10-CM

## 2017-08-16 MED ORDER — DOXYCYCLINE HYCLATE 100 MG PO CAPS: ORAL_CAPSULE | ORAL | 0 refills | Status: DC

## 2017-08-16 NOTE — Patient Instructions (Addendum)
The studies for CBD are better for seizure disorders than any other claims for CBD oil.  The biggest issues with it, is CBD oil is NOT regulated. A recent test of 18 over the counter CBD oil showed that 20% had TOO much, 60 % did not have the claimed amount, and 20 % had the claimed amount.   I have had some patients where it interacts with their medications as well and there is not a way for me to check the interactions since it is not a controlled substance.   With any other the counter medication OR supplement, if you try it, try to get from good source and if you have ANY abnormal symptoms over the next 3 months or don't see any benefits from it stop it.   The quality and quanity of the CBD oil varies greatly so I can not endorse patients taking it at this time.  Paronychia Paronychia is an infection of the skin that surrounds a nail. It usually affects the skin around a fingernail, but it may also occur near a toenail. It often causes pain and swelling around the nail. This condition may come on suddenly or develop over a longer period. In some cases, a collection of pus (abscess) can form near or under the nail. Usually, paronychia is not serious and it clears up with treatment. What are the causes? This condition may be caused by bacteria or fungi. It is commonly caused by either Streptococcus or Staphylococcus bacteria. The bacteria or fungi often cause the infection by getting into the affected area through an opening in the skin, such as a cut or a hangnail. What increases the risk? This condition is more likely to develop in:  People who get their hands wet often, such as those who work as Designer, industrial/product, bartenders, or nurses.  People who bite their fingernails or suck their thumbs.  People who trim their nails too short.  People who have hangnails or injured fingertips.  People who get manicures.  People who have diabetes.  What are the signs or symptoms? Symptoms of this  condition include:  Redness and swelling of the skin near the nail.  Tenderness around the nail when you touch the area.  Pus-filled bumps under the cuticle. The cuticle is the skin at the base or sides of the nail.  Fluid or pus under the nail.  Throbbing pain in the area.  How is this diagnosed? This condition is usually diagnosed with a physical exam. In some cases, a sample of pus may be taken from an abscess to be tested in a lab. This can help to determine what type of bacteria or fungi is causing the condition. How is this treated? Treatment for this condition depends on the cause and severity of the condition. If the condition is mild, it may clear up on its own in a few days. Your health care provider may recommend soaking the affected area in warm water a few times a day. When treatment is needed, the options may include:  Antibiotic medicine, if the condition is caused by a bacterial infection.  Antifungal medicine, if the condition is caused by a fungal infection.  Incision and drainage, if an abscess is present. In this procedure, the health care provider will cut open the abscess so the pus can drain out.  Follow these instructions at home:  Soak the affected area in warm water if directed to do so by your health care provider. You may be told  to do this for 20 minutes, 2-3 times a day. Keep the area dry in between soakings.  Take medicines only as directed by your health care provider.  If you were prescribed an antibiotic medicine, finish all of it even if you start to feel better.  Keep the affected area clean.  Do not try to drain a fluid-filled bump yourself.  If you will be washing dishes or performing other tasks that require your hands to get wet, wear rubber gloves. You should also wear gloves if your hands might come in contact with irritating substances, such as cleaners or chemicals.  Follow your health care provider's instructions about: ? Wound  care. ? Bandage (dressing) changes and removal. Contact a health care provider if:  Your symptoms get worse or do not improve with treatment.  You have a fever or chills.  You have redness spreading from the affected area.  You have continued or increased fluid, blood, or pus coming from the affected area.  Your finger or knuckle becomes swollen or is difficult to move. This information is not intended to replace advice given to you by your health care provider. Make sure you discuss any questions you have with your health care provider. Document Released: 10/20/2000 Document Revised: 10/02/2015 Document Reviewed: 04/03/2014 Elsevier Interactive Patient Education  2018 [employer].    Cellulitis, Adult Cellulitis is a skin infection. The infected area is usually red and sore. This condition occurs most often in the arms and lower legs. It is very important to get treated for this condition. Follow these instructions at home:  Take over-the-counter and prescription medicines only as told by your doctor.  If you were prescribed an antibiotic medicine, take it as told by your doctor. Do not stop taking the antibiotic even if you start to feel better.  Drink enough fluid to keep your pee (urine) clear or pale yellow.  Do not touch or rub the infected area.  Raise (elevate) the infected area above the level of your heart while you are sitting or lying down.  Place warm or cold wet cloths (warm or cold compresses) on the infected area. Do this as told by your doctor.  Keep all follow-up visits as told by your doctor. This is important. These visits let your doctor make sure your infection is not getting worse. Contact a doctor if:  You have a fever.  Your symptoms do not get better after 1-2 days of treatment.  Your bone or joint under the infected area starts to hurt after the skin has healed.  Your infection comes back. This can happen in the same area or another  area.  You have a swollen bump in the infected area.  You have new symptoms.  You feel ill and also have muscle aches and pains. Get help right away if:  Your symptoms get worse.  You feel very sleepy.  You throw up (vomit) or have watery poop (diarrhea) for a long time.  There are red streaks coming from the infected area.  Your red area gets larger.  Your red area turns darker. This information is not intended to replace advice given to you by your health care provider. Make sure you discuss any questions you have with your health care provider. Document Released: 10/13/2007 Document Revised: 10/02/2015 Document Reviewed: 03/05/2015 Elsevier Interactive Patient Education  2018 [place] out  Mini habits for weight loss book  2 apps for tracking food is myfitness pal  loseit  OR can take picture of your food  Dining out: help on how to choose  It is better if you can meal plan and eat at your house but sometimes life happens so here are some tips to help you make healthier choices while eating out:  1) Ask for your server to pack up 1/2 of your meal to take home for lunch or later. Portion sizes are huge so if you don't have all of it in front of you, you are less likely to eat it all.    2) Ask if they have a smaller portion or lunch portion available, this is often cheaper as well!  3) Ask to not have the bread basket brought to the table  4) Ask for the dressing on the side.   5) Look at the nutrition menu BEFORE you go to a restaurant or fast food chain and have what you will eat in mind. You can also look it up on food tracking ups like Myfitness Pal or Lost it. However the web site for the restaurant is usually the best bet.   6) Don't forget that you are the costumer, it is okay to ask them to leave things out, ask about substituting, or ask them to cook with less oil!  Here is some more general information for you!        When it comes to  diets, agreement about the perfect plan isn't easy to find, even among the experts. Experts at the [place] developed an idea known as the Healthy Eating Plate. Just imagine a plate divided into logical, healthy portions.  The emphasis is on diet quality:  Load up on vegetables and fruits - one-half of your plate: Aim for color and variety, and remember that potatoes don't count.  Go for whole grains - one-quarter of your plate: Whole wheat, barley, wheat berries, quinoa, oats, brown rice, and foods made with them. If you want pasta, go with whole wheat pasta.  Protein power - one-quarter of your plate: Fish, chicken, beans, and nuts are all healthy, versatile protein sources. Limit red meat.  The diet, however, does go beyond the plate, offering a few other suggestions.  Use healthy plant oils, such as olive, canola, soy, corn, sunflower and peanut. Check the labels, and avoid partially hydrogenated oil, which have unhealthy trans fats.  If you're thirsty, drink water. Coffee and tea are good in moderation, but skip sugary drinks and limit milk and dairy products to one or two daily servings.  The type of carbohydrate in the diet is more important than the amount. Some sources of carbohydrates, such as vegetables, fruits, whole grains, and beans-are healthier than others.  Finally, stay active.

## 2017-08-16 NOTE — Progress Notes (Signed)
Subjective:    Patient ID: Janet Choi, female    DOB: March 30, 1941, 77 y.o.   MRN: [id number]  HPI 77 y.o. morbidly obese WF with history of HTN, DM2, CKD stage 3 presents with possible bug bite on left arm and left ring finger x 5 days.  No fever, chills.    Blood pressure 118/76, pulse 84, temperature (!) 97.5 F (36.4 C), height 5\' 3"  (1.6 m), SpO2 94 %.  Medications Current Outpatient Medications on File Prior to Visit  Medication Sig  . acetaminophen (TYLENOL) 325 MG tablet Take 2 tablets (650 mg total) by mouth every 4 (four) hours as needed for headache or mild pain.  Marland Kitchen allopurinol (ZYLOPRIM) 100 MG tablet Take 1 tablet daily to prevent Gout  . aspirin EC 81 MG EC tablet Take 1 tablet (81 mg total) by mouth daily.  Marland Kitchen atorvastatin (LIPITOR) 80 MG tablet Take 1 tablet (80 mg total) by mouth daily at 6 PM. Need office visit  . buPROPion (WELLBUTRIN XL) 150 MG 24 hr tablet Take 1 tablet (150 mg total) by mouth daily.  . Cholecalciferol ([employer] VIT D 5000 HIGH-POTENCY) 5000 units capsule Take 5,000 Units by mouth 2 (two) times daily.   . clopidogrel (PLAVIX) 75 MG tablet Take 1 tablet (75 mg total) by mouth daily.  . colchicine 0.6 MG tablet Take 2 tablets first day of gout flare, then one a day until gout flare is better  . escitalopram (LEXAPRO) 10 MG tablet Take 1 tablet (10 mg total) by mouth daily.  Marland Kitchen ezetimibe (ZETIA) 10 MG tablet Take 1 tablet (10 mg total) by mouth daily.  Marland Kitchen FLECTOR 1.3 % PTCH APPLY 1 PATCH EVERY 12 HOURS AS NEEDED THEN OFF 12 HOURS  . furosemide (LASIX) 40 MG tablet Take 1 tablet 3 x / day for fluid retention  . gabapentin (NEURONTIN) 600 MG tablet TAKE ONE-HALF TO ONE TABLET BY MOUTH THREE TIMES DAILY FOR PAIN.  . glucose blood (ONETOUCH VERIO) test strip Use as instructed to check sugar 2 times daily.  Marland Kitchen HYDROcodone-acetaminophen (NORCO/VICODIN) 5-325 MG tablet Take 1 tablet by mouth every 6 (six) hours as needed for moderate pain.  . Insulin Glargine  (BASAGLAR KWIKPEN) 100 UNIT/ML SOPN INJECT 70 UNITS INTO THE SKIN AT BEDTIME.  . Insulin Pen Needle (CAREFINE PEN NEEDLES) 32G X 4 MM MISC Use 4x a day  . Lancets (ONETOUCH ULTRASOFT) lancets Use as instructed  . losartan (COZAAR) 50 MG tablet TAKE 1 TABLET BY MOUTH EVERY DAY  . nitroGLYCERIN (NITROSTAT) 0.4 MG SL tablet Place 1 tablet (0.4 mg total) under the tongue every 5 (five) minutes x 3 doses as needed for chest pain.  Marland Kitchen NOVOLOG FLEXPEN 100 UNIT/ML FlexPen INJECT 16-20 UNITS INTO THE SKIN 3 (THREE) TIMES DAILY WITH MEALS.  Marland Kitchen rOPINIRole (REQUIP) 3 MG tablet Take 1 tablet (3 mg total) 4 (four) times daily by mouth.  Marland Kitchen tiZANidine (ZANAFLEX) 4 MG tablet Take 4 mg by mouth every 6 (six) hours as needed for muscle spasms.   No current facility-administered medications on file prior to visit.     Problem list She has Dyslipidemia; Essential hypertension; Diverticulosis of large intestine; History of colonic polyps; Morbid obesity; Vitamin D deficiency; Medication management; RLS (restless legs syndrome); Type 2 IDDM with complication; OSA on CPAP; Iron deficiency anemia; History of non-ST elevation myocardial infarction (NSTEMI); CKD (chronic kidney disease), stage III ([place] CAD S/P percutaneous coronary angioplasty; Anxiety; and DJD (degenerative joint disease), lumbosacral on their problem  list.   Review of Systems See HPI, no fever, chills.     Objective:   Physical Exam Skin: left proximal forearm with 5 cm area of erythema and tenderness with 2 cm central area of induration, no fluctuance, full ROM elbow without pain. Left ring finger with erythema, swelling and tenderness at cuticle.          Assessment & Plan:  Paronychia ABX, I&D in the office, patient tolerated well, had digital block, cleaned with alcohol, 1 CC lidocaine used, white pus expressed from finger, soak with epson, if not better follow up in the office. Return precautions discussed  Cellulitis Left arm-? Bug  bite- no elbow involvement Doxycycline- if not better call the office- has had in the past WITHOUT reaction   Morbid obesity - follow up 3 months for progress monitoring - increase veggies, decrease carbs - long discussion about weight loss, diet, and exercise  Diabetes 2 IDDM with complication Monitor closely, continue follow up with Dr. Cruzita Lederer, has OV on 25th  Future Appointments  Date Time Provider [place]  09/01/2017 11:00 AM Philemon Kingdom, MD [facility] None  09/05/2017 11:20 AM Skeet Latch, MD [facility]  10/04/2017  2:30 PM [name], MD [facility] None  06/29/2018 10:00 AM Vicie Mutters, PA-C [facility] None  07/04/2018 11:15 AM Vicie Mutters, PA-C [facility] None

## 2017-08-23 ENCOUNTER — Telehealth: Payer: Self-pay | Admitting: Physician Assistant

## 2017-08-23 MED ORDER — CEPHALEXIN 250 MG PO CAPS: 250.0000 mg | ORAL_CAPSULE | Freq: Four times a day (QID) | ORAL | 0 refills | Status: AC

## 2017-08-23 NOTE — Telephone Encounter (Signed)
Pt has been informed of message & rx that was sent to pharmacy.

## 2017-08-23 NOTE — Telephone Encounter (Signed)
Patient seen for bug bite/cellulits on her arm, still some tenderness, no warmth, treated with doxy, she is doing out of town.   Suggest doing warm compresses 2 x a day for 10-20 mins Will send in keflex low dose for 7 days If not better need follow up  If worse or any new symptoms go to ER

## 2017-08-28 ENCOUNTER — Other Ambulatory Visit: Payer: Self-pay | Admitting: Internal Medicine

## 2017-08-31 ENCOUNTER — Other Ambulatory Visit: Payer: Self-pay | Admitting: Cardiovascular Disease

## 2017-08-31 NOTE — Telephone Encounter (Signed)
Refill Request.  

## 2017-09-01 ENCOUNTER — Ambulatory Visit: Payer: Self-pay | Admitting: Internal Medicine

## 2017-09-04 NOTE — Progress Notes (Deleted)
Cardiology Office Note   Date:  09/04/2017   ID:  Janet Choi [birth year], MRN [id number]  PCP:  Janet Pinto, MD  Cardiologist:   Skeet Latch, MD   No chief complaint on file.     History of Present Illness: Janet Choi is a 77 y.o. female with CAD s/p NSTEMI with PCI of the LAD and LCx, hypertensive heart disease, hyperlipidemia, OSA, diabetes, morbid obesity and CKD III who presents for follow up.  Ms. Name presented to [facility] 04/2016 with NSTEMI.  She underwent LHC that revealed 95-99% mid LAD and LCx lesions that were successfully opened with Onyx Resolute DESs. Rash PCI of the left circumflex artery was complicated by microembolization. Left ventriculogram revealed LVEF 40-50%.  Subsequent echo revealed LVEF 50-55% with akinesis of the distal septal and apical myocardium. There is also akinesis of the apical inferior myocardium and grade 1 diastolic dysfunction.   She was readmitted with chest pain on the same day she was discharged from the hospital.  This was not felt to be recurrent ischemia but ticagrelor was switched to clopidogrel.  During that hospitalization Prozac was stopped due to interaction with clopidogrel. She followed up with Rosaria Ferries on 04/26/16 and was feeling weak but otherwise well.  She was again seen in the ED with chest pain on 12/20.  Imdur was added to her regimen.  Since her last appointment Janet Choi saw Kerin Ransom, PA-C on 05/2017.  Her lipids were not at goal so Zetia was added to her regimen.   Past Medical History:  Diagnosis Date  . Anemia    hx (04/14/2016)  . Anxiety   . Arthritis    "severe in my back; hands; ankles" (04/14/2016)  . Basal cell carcinoma    "several burned off; one cut off"  . CAD in native artery    a. NSTEMI 04/2016 - s/p DES toLAD and LCx. PCI to LCx notable for microembolization during cath.  . Chest pain- reslved with stopping Brilinta now on Plaix 04/16/2016  . Chronic lower back pain   .  CKD (chronic kidney disease), stage III ([place])   . Depression   . DJD (degenerative joint disease)   . Family history of adverse reaction to anesthesia    "half-sister used to get real sick" (04/14/2016)  . GERD (gastroesophageal reflux disease)   . History of gout   . Hyperlipidemia   . Hypertension   . IBS (irritable bowel syndrome)   . Ischemic cardiomyopathy    a. 04/2016: EF 40-50% by cath, 50-55% +WMA by echo.  . 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 ([place]) ~ 2015  . Morbid obesity ([place])   . NSTEMI (non-ST elevated myocardial infarction) ([place]) 04/14/2016  . OSA on CPAP   . Peripheral neuropathy   . Peripheral vascular disease ([place])   . RLS (restless legs syndrome)   . Spinal stenosis   . Type II diabetes mellitus ([place])   . Vitamin D deficiency     Past Surgical History:  Procedure Laterality Date  . APPENDECTOMY    . BACK SURGERY    . BASAL CELL CARCINOMA EXCISION Left    leg  . CARDIAC CATHETERIZATION N/A 04/15/2016   Procedure: Left Heart Cath and Coronary Angiography;  Surgeon: Belva Crome, MD;  Location: [facility];  Service: Cardiovascular;  Laterality: N/A;  . CARDIAC CATHETERIZATION N/A 04/15/2016   Procedure: Coronary Stent Intervention;  Surgeon: Belva Crome, MD;  Location:  [facility];  Service: Cardiovascular;  Laterality: N/A;  Mid LAD Mid CFX  . CATARACT EXTRACTION, BILATERAL Bilateral   . JOINT REPLACEMENT    . LUMBAR DISC SURGERY  X 2  . MELANOMA EXCISION Left    "towards the back of my neck"  . SHOULDER ARTHROSCOPY W/ ROTATOR CUFF REPAIR Left   . SHOULDER OPEN ROTATOR CUFF REPAIR Right   . TOTAL KNEE ARTHROPLASTY Bilateral      Current Outpatient Medications  Medication Sig Dispense Refill  . acetaminophen (TYLENOL) 325 MG tablet Take 2 tablets (650 mg total) by mouth every 4 (four) hours as needed for headache or mild pain.    Marland Kitchen allopurinol (ZYLOPRIM) 100 MG tablet Take 1 tablet daily to prevent Gout 90 tablet 3  . aspirin EC 81  MG EC tablet Take 1 tablet (81 mg total) by mouth daily.    Marland Kitchen atorvastatin (LIPITOR) 80 MG tablet Take 1 tablet (80 mg total) by mouth daily at 6 PM. Need office visit 90 tablet 0  . buPROPion (WELLBUTRIN XL) 150 MG 24 hr tablet Take 1 tablet (150 mg total) by mouth daily. 90 tablet 0  . Cholecalciferol ([employer]S VIT D 5000 HIGH-POTENCY) 5000 units capsule Take 5,000 Units by mouth 2 (two) times daily.     . clopidogrel (PLAVIX) 75 MG tablet TAKE 1 TABLET BY MOUTH EVERY DAY 90 tablet 0  . colchicine 0.6 MG tablet Take 2 tablets first day of gout flare, then one a day until gout flare is better 90 tablet 1  . doxycycline (VIBRAMYCIN) 100 MG capsule Take 1 capsule twice daily with food 20 capsule 0  . escitalopram (LEXAPRO) 10 MG tablet Take 1 tablet (10 mg total) by mouth daily. 90 tablet 3  . ezetimibe (ZETIA) 10 MG tablet Take 1 tablet (10 mg total) by mouth daily. 90 tablet 3  . FLECTOR 1.3 % PTCH APPLY 1 PATCH EVERY 12 HOURS AS NEEDED THEN OFF 12 HOURS 30 patch 0  . furosemide (LASIX) 40 MG tablet Take 1 tablet 3 x / day for fluid retention 270 tablet 1  . gabapentin (NEURONTIN) 600 MG tablet TAKE ONE-HALF TO ONE TABLET BY MOUTH THREE TIMES DAILY FOR PAIN. 270 tablet 1  . glucose blood (ONETOUCH VERIO) test strip Use as instructed to check sugar 2 times daily. 200 each 5  . HYDROcodone-acetaminophen (NORCO/VICODIN) 5-325 MG tablet Take 1 tablet by mouth every 6 (six) hours as needed for moderate pain.    . Insulin Glargine (BASAGLAR KWIKPEN) 100 UNIT/ML SOPN INJECT 70 UNITS INTO THE SKIN AT BEDTIME. 75 pen 0  . Insulin Pen Needle (CAREFINE PEN NEEDLES) 32G X 4 MM MISC Use 4x a day 300 each 11  . Lancets (ONETOUCH ULTRASOFT) lancets Use as instructed 100 each 12  . losartan (COZAAR) 50 MG tablet TAKE 1 TABLET BY MOUTH EVERY DAY 90 tablet 1  . nitroGLYCERIN (NITROSTAT) 0.4 MG SL tablet Place 1 tablet (0.4 mg total) under the tongue every 5 (five) minutes x 3 doses as needed for chest pain. 25 tablet 4    . NOVOLOG FLEXPEN 100 UNIT/ML FlexPen INJECT 16-20 UNITS INTO THE SKIN 3 (THREE) TIMES DAILY WITH MEALS. 21 pen 6  . rOPINIRole (REQUIP) 3 MG tablet Take 1 tablet (3 mg total) 4 (four) times daily by mouth. 360 tablet 1  . tiZANidine (ZANAFLEX) 4 MG tablet Take 4 mg by mouth every 6 (six) hours as needed for muscle spasms.     No  current facility-administered medications for this visit.     Allergies:   Brilinta [ticagrelor]; Aspirin; Nsaids; Minocycline hcl; Oruvail [ketoprofen]; Tricor [fenofibrate]; Vasotec [enalaprilat]; and Zinc    Social History:  The patient  reports that she quit smoking about 34 years ago. Her smoking use included cigarettes. She has a 62.50 pack-year smoking history. She has quit using smokeless tobacco. She reports that she does not drink alcohol or use drugs.   Family History:  The patient's family history includes AAA (abdominal aortic aneurysm) in her father; Heart disease in her mother and son; Kidney disease in her father.    ROS:  Please see the history of present illness.   Otherwise, review of systems are positive for none.   All other systems are reviewed and negative.    PHYSICAL EXAM: VS:  There were no vitals taken for this visit. , BMI There is no height or weight on file to calculate BMI. GENERAL:  Well appearing HEENT:  Pupils equal round and reactive, fundi not visualized, oral mucosa unremarkable NECK:  No jugular venous distention, waveform within normal limits, carotid upstroke brisk and symmetric, no bruits, no thyromegaly LYMPHATICS:  No cervical adenopathy LUNGS:  Clear to auscultation bilaterally HEART:  RRR.  PMI not displaced or sustained,S1 and S2 within normal limits, no S3, no S4, no clicks, no rubs, no murmurs ABD:  Flat, positive bowel sounds normal in frequency in pitch, no bruits, no rebound, no guarding, no midline pulsatile mass, no hepatomegaly, no splenomegaly EXT:  2 plus pulses throughout, no edema, no cyanosis no  clubbing SKIN:  No rashes no nodules NEURO:  Cranial nerves II through XII grossly intact, motor grossly intact throughout PSYCH:  Cognitively intact, oriented to person place and time   EKG:  EKG is not ordered today.  Echo 04/15/16: Study Conclusions  - Left ventricle: The cavity size was normal. There was moderate   focal basal and mild concentric hypertrophy. Systolic function   was normal. The estimated ejection fraction was in the range of   50% to 55%. There is akinesis of the distal septal and apical   myocardium. There is akinesis of the apicalinferior myocardium.   There was an increased relative contribution of atrial   contraction to ventricular filling. Doppler parameters are   consistent with abnormal left ventricular relaxation (grade 1   diastolic dysfunction). Doppler parameters are consistent with   high ventricular filling pressure. - Aortic valve: Mildly to moderately calcified annulus. Mildly   calcified leaflets. - Mitral valve: Calcified annulus. Mildly calcified leaflets .   There was trivial regurgitation. - Atrial septum: No defect or patent foramen ovale was identified.  LHC12/7/17: Non-ST elevation myocardial infarction with identification of 35-32% segmental mid LAD stenosis and focal eccentric 95-99% mid circumflex stenosis.  The first diagonal is small and contains segmental 80% stenosis. The coronaries are otherwise widely patent.  Successful drug-eluting stent using Onyx Resolute to reduce a 95% mid LAD stenosis to 0% with TIMI grade 3 flow. Final stent diameter 3.0 mm.  Successful drug-eluting stent using Onyx Resolute to reduce a 95% mid circumflex stenosis to 0% with TIMI grade 3 flow. Post stent diameter 3.5 mm. Intervention on this vessel was, complicated by microembolization to the the most distal obtuse marginal beyond a bifurcation.  Mild hypokinesis of the anterior wall. EF 40-50%.  Recent Labs: 06/28/2017: ALT 10; Magnesium 2.1; TSH  1.15 08/02/2017: BUN 18; Creat 1.30; Hemoglobin 11.3; Platelets 187; Potassium 4.6; Sodium 142  Lipid Panel    Component Value Date/Time   CHOL 142 06/28/2017 1208   TRIG 315 (H) 06/28/2017 1208   HDL 38 (L) 06/28/2017 1208   CHOLHDL 3.7 06/28/2017 1208   VLDL 64 (H) 09/27/2016 1527   LDLCALC 66 06/28/2017 1208      Wt Readings from Last 3 Encounters:  06/28/17 237 lb 9.6 oz (107.8 kg)  06/02/17 235 lb 9.6 oz (106.9 kg)  06/02/17 235 lb (106.6 kg)      ASSESSMENT AND PLAN:  # CAD s/p NSTEMI:  Janet Choi had an NSTEMI 04/2016.  She needs to continue on dual antiplatelet therapy through 04/2017. She is doing well and has no angina. I did encourage her to start increasing her exercise.  Continue aspirin, atorvastatin and clopidogrel. She has not been on beta blockers due to symptomatic bradycardia. Consider challenge with carvedilol.   # Hyperlipidemia: # Hypertriglyceridemia: She is due to check fasting lipids and CMP. Continue atorvastatin and fish oil.   # Hypertension: Continue losartan and furosemide.  # Morbid obesity: Encouraged 90-150 minutes of exercise weekly.   Current medicines are reviewed at length with the patient today.  The patient has concerns regarding medicines.  The following changes have been made:  no change  Labs/ tests ordered today include:   No orders of the defined types were placed in this encounter.    Disposition:   FU with Wilmer Berryhill C. Oval Linsey, MD, [facility] in 6 months   This note was written with the assistance of speech recognition software.  Please excuse any transcriptional errors.  Signed, Addalee Kavanagh C. Oval Linsey, MD, [facility]  09/04/2017 8:12 PM    [facility]

## 2017-09-05 ENCOUNTER — Ambulatory Visit: Payer: PPO | Admitting: Cardiovascular Disease

## 2017-09-14 ENCOUNTER — Ambulatory Visit (INDEPENDENT_AMBULATORY_CARE_PROVIDER_SITE_OTHER): Payer: PPO | Admitting: Internal Medicine

## 2017-09-14 ENCOUNTER — Encounter: Payer: Self-pay | Admitting: Internal Medicine

## 2017-09-14 VITALS — BP 106/62 | HR 80 | Temp 97.3°F | Resp 16 | Ht 63.0 in | Wt 246.0 lb

## 2017-09-14 DIAGNOSIS — M7072 Other bursitis of hip, left hip: Secondary | ICD-10-CM

## 2017-09-14 DIAGNOSIS — E1122 Type 2 diabetes mellitus with diabetic chronic kidney disease: Secondary | ICD-10-CM

## 2017-09-14 DIAGNOSIS — E1142 Type 2 diabetes mellitus with diabetic polyneuropathy: Secondary | ICD-10-CM | POA: Diagnosis not present

## 2017-09-14 DIAGNOSIS — N183 Chronic kidney disease, stage 3 (moderate): Secondary | ICD-10-CM | POA: Diagnosis not present

## 2017-09-14 DIAGNOSIS — E1165 Type 2 diabetes mellitus with hyperglycemia: Secondary | ICD-10-CM

## 2017-09-14 DIAGNOSIS — Z794 Long term (current) use of insulin: Secondary | ICD-10-CM

## 2017-09-14 DIAGNOSIS — IMO0002 Reserved for concepts with insufficient information to code with codable children: Secondary | ICD-10-CM

## 2017-09-14 MED ORDER — DEXAMETHASONE 0.5 MG PO TABS: ORAL_TABLET | ORAL | 0 refills | Status: DC

## 2017-09-14 NOTE — Progress Notes (Signed)
Subjective:    Patient ID: Janet Choi, female    DOB: [birth year], 77 y.o.   MRN: [id number]  HPI   This nice 77 yo DWF w/hx/o poorly controlled Ins Req T2_DM presents with c/o Lt hip& buttock pain. Has been seen by Dr Lynann Bologna and recently had a steroid injection by Dr Jacelyn Grip about 3 weeks ago w/o objective improvement of her pain.  Reports pain arising from sitting & w/standing/walking.  Medication Sig  . acetaminophen (TYLENOL) 325 MG tablet Take 2 tablets (650 mg total) by mouth every 4 (four) hours as needed for headache or mild pain.  Marland Kitchen allopurinol (ZYLOPRIM) 100 MG tablet Take 1 tablet daily to prevent Gout  . aspirin EC 81 MG EC tablet Take 1 tablet (81 mg total) by mouth daily.  Marland Kitchen atorvastatin (LIPITOR) 80 MG tablet Take 1 tablet (80 mg total) by mouth daily at 6 PM. Need office visit  . buPROPion (WELLBUTRIN XL) 150 MG 24 hr tablet Take 1 tablet (150 mg total) by mouth daily.  . clopidogrel (PLAVIX) 75 MG tablet TAKE 1 TABLET BY MOUTH EVERY DAY  . colchicine 0.6 MG tablet Take 2 tablets first day of gout flare, then one a day until gout flare is better  . escitalopram (LEXAPRO) 10 MG tablet Take 1 tablet (10 mg total) by mouth daily.  Marland Kitchen FLECTOR 1.3 % PTCH APPLY 1 PATCH EVERY 12 HOURS AS NEEDED THEN OFF 12 HOURS  . furosemide (LASIX) 40 MG tablet Take 1 tablet 3 x / day for fluid retention  . gabapentin (NEURONTIN) 600 MG tablet TAKE ONE-HALF TO ONE TABLET BY MOUTH THREE TIMES DAILY FOR PAIN.  . glucose blood (ONETOUCH VERIO) test strip Use as instructed to check sugar 2 times daily.  . Insulin Glargine (BASAGLAR KWIKPEN) 100 UNIT/ML SOPN INJECT 70 UNITS INTO THE SKIN AT BEDTIME.  . Insulin Pen Needle (CAREFINE PEN NEEDLES) 32G X 4 MM MISC Use 4x a day  . Lancets (ONETOUCH ULTRASOFT) lancets Use as instructed  . losartan (COZAAR) 50 MG tablet TAKE 1 TABLET BY MOUTH EVERY DAY  . nitroGLYCERIN (NITROSTAT) 0.4 MG SL tablet Place 1 tablet (0.4 mg total) under the tongue every 5 (five)  minutes x 3 doses as needed for chest pain.  Marland Kitchen NOVOLOG FLEXPEN 100 UNIT/ML FlexPen INJECT 16-20 UNITS INTO THE SKIN 3 (THREE) TIMES DAILY WITH MEALS.  Marland Kitchen rOPINIRole  3 MG tablet Take 1 tablet (3 mg total) 4 (four) times daily by mouth.  . Cholecalciferol (CVS VIT D 5000 HIGH-POTENCY) 5000 units capsule Take 5,000 Units by mouth 2 (two) times daily.   Marland Kitchen ezetimibe (ZETIA) 10 MG tablet Take 1 tablet (10 mg total) by mouth daily.  Marland Kitchen HYDROcodone-acetaminophen (NORCO/VICODIN) 5-325 MG tablet Take 1 tablet by mouth every 6 (six) hours as needed for moderate pain.   Allergies  Allergen Reactions  . Brilinta [Ticagrelor] Shortness Of Breath and Other (See Comments)    Chest pain (also)  . Aspirin Other (See Comments)    Can tolerate in small doses (is already on a blood thinner AND has kidney disease)  . Nsaids Other (See Comments)    Patient is taking a blood thinner and has kidney disease  . Minocycline Hcl Other (See Comments)    Welts  . Oruvail [Ketoprofen] Other (See Comments)    Has kidney disease and is taking a blood thinner  . Tricor [Fenofibrate]     Reaction unknown  . Vasotec [Enalaprilat]     Reaction unknown  .  Zinc     Reaction unknown   Past Medical History:  Diagnosis Date  . Anemia    hx (04/14/2016)  . Anxiety   . Arthritis    "severe in my back; hands; ankles" (04/14/2016)  . Basal cell carcinoma    "several burned off; one cut off"  . CAD in native artery    a. NSTEMI 04/2016 - s/p DES toLAD and LCx. PCI to LCx notable for microembolization during cath.  . Chest pain- reslved with stopping Brilinta now on Plaix 04/16/2016  . Chronic lower back pain   . CKD (chronic kidney disease), stage III ([place])   . Depression   . DJD (degenerative joint disease)   . Family history of adverse reaction to anesthesia    "half-sister used to get real sick" (04/14/2016)  . GERD (gastroesophageal reflux disease)   . History of gout   . Hyperlipidemia   . Hypertension   . IBS  (irritable bowel syndrome)   . Ischemic cardiomyopathy    a. 04/2016: EF 40-50% by cath, 50-55% +WMA by echo.  . 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 ([place]) ~ 2015  . Morbid obesity ([place])   . NSTEMI (non-ST elevated myocardial infarction) ([place]) 04/14/2016  . OSA on CPAP   . Peripheral neuropathy   . Peripheral vascular disease ([place])   . RLS (restless legs syndrome)   . Spinal stenosis   . Type II diabetes mellitus ([place])   . Vitamin D deficiency    Past Surgical History:  Procedure Laterality Date  . APPENDECTOMY    . BACK SURGERY    . BASAL CELL CARCINOMA EXCISION Left    leg  . CARDIAC CATHETERIZATION N/A 04/15/2016   Procedure: Left Heart Cath and Coronary Angiography;  Surgeon: Belva Crome, MD;  Location: [facility];  Service: Cardiovascular;  Laterality: N/A;  . CARDIAC CATHETERIZATION N/A 04/15/2016   Procedure: Coronary Stent Intervention;  Surgeon: Belva Crome, MD;  Location: [facility];  Service: Cardiovascular;  Laterality: N/A;  Mid LAD Mid CFX  . CATARACT EXTRACTION, BILATERAL Bilateral   . JOINT REPLACEMENT    . LUMBAR DISC SURGERY  X 2  . MELANOMA EXCISION Left    "towards the back of my neck"  . SHOULDER ARTHROSCOPY W/ ROTATOR CUFF REPAIR Left   . SHOULDER OPEN ROTATOR CUFF REPAIR Right   . TOTAL KNEE ARTHROPLASTY Bilateral    Review of Systems  10 point systems review negative except as above.    Objective:   Physical Exam  BP 106/62   Pulse 80   Temp (!) 97.3 F (36.3 C)   Resp 16   Ht 5\' 3"  (1.6 m)   Wt 246 lb (111.6 kg)   BMI 43.58 kg/m   Severe Morbid Obesity. Appears uncomfortable and has moderate discomfort arising from a chair.   HEENT - WNL. Neck - supple.  Chest - Clear equal BS. Cor - Nl HS. RRR w/o sig MGR. PP 1(+). No edema. MS-  (+) point tenderness at Lt Ischial  and Trochanteric bursaes. Bilat Nl hip ROM.  quaisi (+/-) Rt SLR. Nl Hip figure - 4 ROM.  Neuro -  Nl w/o focal abnormalities.Sensation intact to touch,  vibratory and Monofilament to the toes bilaterally.     Assessment & Plan:   1. Bursitis of left hip, unspecified bursa  - dexamethasone  0.5 MG tablet; Take 1 tab 3 x day - 3 days, then 2 x day -  3 days, then 1 tab daily  Dispense: 20 tablet  2. Uncontrolled type 2 diabetes mellitus with stage 3 chronic kidney disease, with long-term current use of insulin ([place])  - discussed improving diet   3. Diabetic polyneuropathy associated with type 2 diabetes mellitus ([place])  =- encouraged her to use her Gabapentin for pain.

## 2017-09-16 MED ORDER — DEXAMETHASONE 0.5 MG PO TABS: ORAL_TABLET | ORAL | 0 refills | Status: DC

## 2017-09-16 NOTE — Addendum Note (Signed)
Addended by: [name] on: 09/16/2017 10:11 AM   Modules accepted: Orders

## 2017-09-20 ENCOUNTER — Other Ambulatory Visit: Payer: Self-pay | Admitting: Physician Assistant

## 2017-09-21 ENCOUNTER — Other Ambulatory Visit: Payer: Self-pay | Admitting: Internal Medicine

## 2017-10-04 ENCOUNTER — Encounter: Payer: Self-pay | Admitting: Internal Medicine

## 2017-10-04 ENCOUNTER — Ambulatory Visit (INDEPENDENT_AMBULATORY_CARE_PROVIDER_SITE_OTHER): Payer: PPO | Admitting: Internal Medicine

## 2017-10-04 ENCOUNTER — Other Ambulatory Visit: Payer: Self-pay | Admitting: *Deleted

## 2017-10-04 VITALS — BP 108/76 | HR 76 | Temp 97.3°F | Resp 18 | Ht 63.0 in | Wt 249.8 lb

## 2017-10-04 DIAGNOSIS — Z79899 Other long term (current) drug therapy: Secondary | ICD-10-CM | POA: Diagnosis not present

## 2017-10-04 DIAGNOSIS — E559 Vitamin D deficiency, unspecified: Secondary | ICD-10-CM | POA: Diagnosis not present

## 2017-10-04 DIAGNOSIS — E1122 Type 2 diabetes mellitus with diabetic chronic kidney disease: Secondary | ICD-10-CM

## 2017-10-04 DIAGNOSIS — I10 Essential (primary) hypertension: Secondary | ICD-10-CM | POA: Diagnosis not present

## 2017-10-04 DIAGNOSIS — E1140 Type 2 diabetes mellitus with diabetic neuropathy, unspecified: Secondary | ICD-10-CM | POA: Diagnosis not present

## 2017-10-04 DIAGNOSIS — Z9989 Dependence on other enabling machines and devices: Secondary | ICD-10-CM | POA: Diagnosis not present

## 2017-10-04 DIAGNOSIS — G4733 Obstructive sleep apnea (adult) (pediatric): Secondary | ICD-10-CM | POA: Diagnosis not present

## 2017-10-04 DIAGNOSIS — E782 Mixed hyperlipidemia: Secondary | ICD-10-CM

## 2017-10-04 DIAGNOSIS — I2510 Atherosclerotic heart disease of native coronary artery without angina pectoris: Secondary | ICD-10-CM

## 2017-10-04 DIAGNOSIS — Z794 Long term (current) use of insulin: Secondary | ICD-10-CM

## 2017-10-04 DIAGNOSIS — N183 Chronic kidney disease, stage 3 (moderate): Secondary | ICD-10-CM | POA: Diagnosis not present

## 2017-10-04 DIAGNOSIS — Z9861 Coronary angioplasty status: Secondary | ICD-10-CM

## 2017-10-04 MED ORDER — LIRAGLUTIDE 18 MG/3ML ~~LOC~~ SOPN: 1.8000 mg | PEN_INJECTOR | SUBCUTANEOUS | 3 refills | Status: DC

## 2017-10-04 MED ORDER — GABAPENTIN 800 MG PO TABS: ORAL_TABLET | ORAL | 3 refills | Status: DC

## 2017-10-04 MED ORDER — NITROGLYCERIN 0.4 MG SL SUBL: 0.4000 mg | SUBLINGUAL_TABLET | SUBLINGUAL | 4 refills | Status: DC | PRN

## 2017-10-04 NOTE — Patient Instructions (Signed)

## 2017-10-04 NOTE — Progress Notes (Signed)
This very nice 77 y.o. 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 Patient continues to c/o Pain along the posterior Lt thigh    Patient is treated for HTN & BP has been controlled at home. Today's BP is at goal - 108/76. Patient has hx/o NSTEMI (Dec 2017) underwent PCA/Stents x 2. Patient has had no complaints of any cardiac type chest pain, palpitations, dyspnea / orthopnea / PND, dizziness, claudication, or dependent edema.     Patient alleges Statin Allergy/Intolerance and Lipids are not at goal with diet & gZetia.  Patient denies myalgias or other med SE's. Last Lipids were not at goal: Lab Results  Component Value Date   CHOL 261 (H) 10/04/2017   HDL 40 (L) 10/04/2017   LDLCALC LDL cholesterol not calculated 10/04/2017   TRIG 475 (H) 10/04/2017   CHOLHDL 6.5 (H) 10/04/2017      Also, the patient has history of  Gluttony and poor dietary compliance with Morbid Obesity (BMI 44+)  and  Insulin Requiring T2_NIDDM (1995) w/CKD 3 (GFR 43) and has had no symptoms of reactive hypoglycemia, diabetic polys, paresthesias or visual blurring.   She reports that she's currently taking Basagalar 70 units daily and Novolog 20 units BID to TID and she alleges that her FBG's are usually <130's.  And her last A1c was not at goal: Lab Results  Component Value Date   HGBA1C 8.7% 06/02/2017      Further, the patient also has history of Vitamin D Deficiency and supplements vitamin D without any suspected side-effects. Last vitamin D was still low.  Lab Results  Component Value Date   VD25OH 43 06/28/2017   Current Outpatient Medications on File Prior to Visit  Medication Sig  . acetaminophen (TYLENOL) 325 MG tablet Take 2 tablets (650 mg total) by mouth every 4 (four) hours as needed for headache or mild pain.  Marland Kitchen allopurinol (ZYLOPRIM) 100 MG tablet Take 1 tablet daily to prevent Gout  . atorvastatin (LIPITOR) 80 MG tablet Take 1 tablet (80 mg  total) by mouth daily at 6 PM. Need office visit  . buPROPion (WELLBUTRIN XL) 150 MG 24 hr tablet TAKE 1 TABLET BY MOUTH EVERY DAY  . Cholecalciferol ([employer] VIT D 5000 HIGH-POTENCY) 5000 units capsule Take 5,000 Units by mouth 2 (two) times daily.   . clopidogrel (PLAVIX) 75 MG tablet TAKE 1 TABLET BY MOUTH EVERY DAY  . colchicine 0.6 MG tablet Take 2 tablets first day of gout flare, then one a day until gout flare is better  . escitalopram (LEXAPRO) 10 MG tablet Take 1 tablet (10 mg total) by mouth daily.  Marland Kitchen FLECTOR 1.3 % PTCH APPLY 1 PATCH EVERY 12 HOURS AS NEEDED THEN OFF 12 HOURS  . furosemide (LASIX) 40 MG tablet Take 1 tablet 3 x / day for fluid retention  . Insulin Glargine (BASAGLAR KWIKPEN) 100 UNIT/ML SOPN INJECT 70 UNITS INTO THE SKIN AT BEDTIME.  . Insulin Pen Needle (CAREFINE PEN NEEDLES) 32G X 4 MM MISC Use 4x a day  . Lancets (ONETOUCH ULTRASOFT) lancets Use as instructed  . losartan (COZAAR) 50 MG tablet TAKE 1 TABLET BY MOUTH EVERY DAY  . NOVOLOG FLEXPEN 100 UNIT/ML FlexPen INJECT 16-20 UNITS INTO THE SKIN 3 (THREE) TIMES DAILY WITH MEALS.  Marland Kitchen ONETOUCH VERIO test strip USE AS INSTRUCTED TO CHECK SUGAR 2 TIMES DAILY.  Marland Kitchen rOPINIRole (REQUIP) 3 MG tablet Take 1 tablet (  3 mg total) 4 (four) times daily by mouth.  Marland Kitchen aspirin EC 81 MG EC tablet Take 1 tablet (81 mg total) by mouth daily. (Patient not taking: Reported on 10/04/2017)  . ezetimibe (ZETIA) 10 MG tablet Take 1 tablet (10 mg total) by mouth daily.  Marland Kitchen tiZANidine (ZANAFLEX) 4 MG tablet Take 4 mg by mouth every 6 (six) hours as needed for muscle spasms.   No current facility-administered medications on file prior to visit.    Allergies  Allergen Reactions  . Brilinta [Ticagrelor] Shortness Of Breath and Other (See Comments)    Chest pain (also)  . Aspirin Other (See Comments)    Can tolerate in small doses (is already on a blood thinner AND has kidney disease)  . Nsaids Other (See Comments)    Patient is taking a blood  thinner and has kidney disease  . Minocycline Hcl Other (See Comments)    Welts  . Oruvail [Ketoprofen] Other (See Comments)    Has kidney disease and is taking a blood thinner  . Tricor [Fenofibrate]     Reaction unknown  . Vasotec [Enalaprilat]     Reaction unknown  . Zinc     Reaction unknown   PMHx:   Past Medical History:  Diagnosis Date  . Anemia    hx (04/14/2016)  . Anxiety   . Arthritis    "severe in my back; hands; ankles" (04/14/2016)  . Basal cell carcinoma    "several burned off; one cut off"  . CAD in native artery    a. NSTEMI 04/2016 - s/p DES toLAD and LCx. PCI to LCx notable for microembolization during cath.  . Chest pain- reslved with stopping Brilinta now on Plaix 04/16/2016  . Chronic lower back pain   . CKD (chronic kidney disease), stage III ([place])   . Depression   . DJD (degenerative joint disease)   . Family history of adverse reaction to anesthesia    "half-sister used to get real sick" (04/14/2016)  . GERD (gastroesophageal reflux disease)   . History of gout   . Hyperlipidemia   . Hypertension   . IBS (irritable bowel syndrome)   . Ischemic cardiomyopathy    a. 04/2016: EF 40-50% by cath, 50-55% +WMA by echo.  . 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 ([place]) ~ 2015  . Morbid obesity ([place])   . NSTEMI (non-ST elevated myocardial infarction) ([place]) 04/14/2016  . OSA on CPAP   . Peripheral neuropathy   . Peripheral vascular disease ([place])   . RLS (restless legs syndrome)   . Spinal stenosis   . Type II diabetes mellitus ([place])   . Vitamin D deficiency    Immunization History  Administered Date(s) Administered  . DT 04/14/2015  . Hepatitis B 07/09/1999, 08/09/1999, 01/09/2000  . Influenza, High Dose Seasonal PF 03/20/2014  . Influenza,inj,quad, With Preservative 04/26/2016  . Influenza-Unspecified 03/28/2015, 04/14/2015, 02/14/2017  . Pneumococcal Conjugate-13 03/20/2014  . Pneumococcal Polysaccharide-23 10/15/2009  . Td 03/19/2004   Past  Surgical History:  Procedure Laterality Date  . APPENDECTOMY    . BACK SURGERY    . BASAL CELL CARCINOMA EXCISION Left    leg  . CARDIAC CATHETERIZATION N/A 04/15/2016   Procedure: Left Heart Cath and Coronary Angiography;  Surgeon: Belva Crome, MD;  Location: [facility];  Service: Cardiovascular;  Laterality: N/A;  . CARDIAC CATHETERIZATION N/A 04/15/2016   Procedure: Coronary Stent Intervention;  Surgeon: Belva Crome, MD;  Location: [facility];  Service: Cardiovascular;  Laterality: N/A;  Mid LAD Mid CFX  . CATARACT EXTRACTION, BILATERAL Bilateral   . JOINT REPLACEMENT    . LUMBAR DISC SURGERY  X 2  . MELANOMA EXCISION Left    "towards the back of my neck"  . SHOULDER ARTHROSCOPY W/ ROTATOR CUFF REPAIR Left   . SHOULDER OPEN ROTATOR CUFF REPAIR Right   . TOTAL KNEE ARTHROPLASTY Bilateral    FHx:    Reviewed / unchanged  SHx:    Reviewed / unchanged  Systems Review:  Constitutional: Denies fever, chills, wt changes, headaches, insomnia, fatigue, night sweats, change in appetite. Eyes: Denies redness, blurred vision, diplopia, discharge, itchy, watery eyes.  ENT: Denies discharge, congestion, post nasal drip, epistaxis, sore throat, earache, hearing loss, dental pain, tinnitus, vertigo, sinus pain, snoring.  CV: Denies chest pain, palpitations, irregular heartbeat, syncope, dyspnea, diaphoresis, orthopnea, PND, claudication or edema. Respiratory: denies cough, dyspnea, DOE, pleurisy, hoarseness, laryngitis, wheezing.  Gastrointestinal: Denies dysphagia, odynophagia, heartburn, reflux, water brash, abdominal pain or cramps, nausea, vomiting, bloating, diarrhea, constipation, hematemesis, melena, hematochezia  or hemorrhoids. Genitourinary: Denies dysuria, frequency, urgency, nocturia, hesitancy, discharge, hematuria or flank pain. Musculoskeletal: Denies arthralgias, myalgias, stiffness, jt. swelling, pain, limping or strain/sprain.  Skin: Denies pruritus, rash,  hives, warts, acne, eczema or change in skin lesion(s). Neuro: No weakness, tremor, incoordination, spasms, paresthesia or pain. Psychiatric: Denies confusion, memory loss or sensory loss. Endo: Denies change in weight, skin or hair change.  Heme/Lymph: No excessive bleeding, bruising or enlarged lymph nodes.  Physical Exam  BP 108/76   Pulse 76   Temp (!) 97.3 F (36.3 C)   Resp 18   Ht 5\' 3"  (1.6 m)   Wt 249 lb 12.8 oz (113.3 kg)   BMI 44.25 kg/m   Appears  well nourished, well groomed  and in no distress.  Eyes: PERRLA, EOMs, conjunctiva no swelling or erythema. Sinuses: No frontal/maxillary tenderness ENT/Mouth: EAC's clear, TM's nl w/o erythema, bulging. Nares clear w/o erythema, swelling, exudates. Oropharynx clear without erythema or exudates. Oral hygiene is good. Tongue normal, non obstructing. Hearing intact.  Neck: Supple. Thyroid not palpable. Car 2+/2+ without bruits, nodes or JVD. Chest: Respirations nl with BS clear & equal w/o rales, rhonchi, wheezing or stridor.  Cor: Heart sounds normal w/ regular rate and rhythm without sig. murmurs, gallops, clicks or rubs. Peripheral pulses normal and equal  without edema.  Abdomen: Soft & bowel sounds normal. Non-tender w/o guarding, rebound, hernias, masses or organomegaly.  Lymphatics: Unremarkable.  Musculoskeletal: Full ROM all peripheral extremities, joint stability, 5/5 strength and normal gait.  Skin: Warm, dry without exposed rashes, lesions or ecchymosis apparent.  Neuro: Cranial nerves intact, reflexes equal bilaterally. Decreased LE sensation in a stocking distribution.  Motor testing grossly intact. Tendon reflexes grossly intact.  Pysch: Alert & oriented x 3.  Insight and judgement nl & appropriate. No ideations.  Assessment and Plan:  1. Essential hypertension  - Continue medication, monitor blood pressure at home.  - Continue DASH diet.  Reminder to go to the ER if any CP,  SOB, nausea, dizziness, severe HA,  changes vision/speech.  - CBC with Differential/Platelet - COMPLETE METABOLIC PANEL WITH GFR - Magnesium - TSH  2. Hyperlipidemia, mixed  - Continue diet/meds, exercise,& lifestyle modifications.  - Continue monitor periodic cholesterol/liver & renal functions   - Lipid panel - TSH  3. Type 2 diabetes mellitus with stage 3 chronic kidney disease, with long-term current use of insulin (HCC)  - Continue diet,  exercise, lifestyle modifications.  - Monitor appropriate labs.  - Hemoglobin A1c - liraglutide (VICTOZA) 18 MG/3ML SOPN; Inject 0.3 mLs (1.8 mg total) into the skin once a week for 12 doses.  Dispense: 6 pen; Refill: 3  4. Vitamin D deficiency  - Continue supplementation.  - VITAMIN D 25 Hydroxy  5. Morbid obesity   (BMI 44+)   6. CAD S/P percutaneous coronary angioplasty  - Lipid panel  7. OSA on CPAP  8. Medication management  - CBC with Differential/Platelet - COMPLETE METABOLIC PANEL WITH GFR - Magnesium - Lipid panel - TSH - Hemoglobin A1c - VITAMIN D 25 Hydroxy   9. Diabetic neuropathy, painful (HCC)  - gabapentin (NEURONTIN) 800 MG tablet; Take 1/2 to 1  tablet 3 to 4 x / day for chronic pain  Dispense: 360 tablet; Refill: 3          Discussed  regular exercise, BP monitoring, weight control to achieve/maintain BMI less than 25 and discussed med and SE's. Recommended labs to assess and monitor clinical status with further disposition pending results of labs. Over 30 minutes of exam, counseling, chart review was performed.

## 2017-10-05 ENCOUNTER — Other Ambulatory Visit: Payer: Self-pay | Admitting: Internal Medicine

## 2017-10-05 LAB — CBC WITH DIFFERENTIAL/PLATELET
BASOS ABS: 12 {cells}/uL (ref 0–200)
Basophils Relative: 0.3 %
EOS PCT: 4.3 %
Eosinophils Absolute: 168 cells/uL (ref 15–500)
HCT: 39.9 % (ref 35.0–45.0)
Hemoglobin: 13.4 g/dL (ref 11.7–15.5)
LYMPHS ABS: 1045 {cells}/uL (ref 850–3900)
MCH: 30 pg (ref 27.0–33.0)
MCHC: 33.6 g/dL (ref 32.0–36.0)
MCV: 89.5 fL (ref 80.0–100.0)
MPV: 10.5 fL (ref 7.5–12.5)
Monocytes Relative: 4.3 %
NEUTROS PCT: 64.3 %
Neutro Abs: 2508 cells/uL (ref 1500–7800)
Platelets: 184 10*3/uL (ref 140–400)
RBC: 4.46 10*6/uL (ref 3.80–5.10)
RDW: 12.8 % (ref 11.0–15.0)
Total Lymphocyte: 26.8 %
WBC: 3.9 10*3/uL (ref 3.8–10.8)
WBCMIX: 168 {cells}/uL — AB (ref 200–950)

## 2017-10-05 LAB — COMPLETE METABOLIC PANEL WITH GFR
AG RATIO: 1.8 (calc) (ref 1.0–2.5)
ALKALINE PHOSPHATASE (APISO): 95 U/L (ref 33–130)
ALT: 14 U/L (ref 6–29)
AST: 11 U/L (ref 10–35)
Albumin: 4.2 g/dL (ref 3.6–5.1)
BUN/Creatinine Ratio: 18 (calc) (ref 6–22)
BUN: 22 mg/dL (ref 7–25)
CO2: 29 mmol/L (ref 20–32)
Calcium: 9.5 mg/dL (ref 8.6–10.4)
Chloride: 101 mmol/L (ref 98–110)
Creat: 1.22 mg/dL — ABNORMAL HIGH (ref 0.60–0.93)
GFR, Est African American: 50 mL/min/{1.73_m2} — ABNORMAL LOW (ref >=60)
GFR, Est Non African American: 43 mL/min/{1.73_m2} — ABNORMAL LOW (ref >=60)
Globulin: 2.4 g/dL (calc) (ref 1.9–3.7)
Glucose, Bld: 262 mg/dL — ABNORMAL HIGH (ref 65–99)
POTASSIUM: 4.2 mmol/L (ref 3.5–5.3)
SODIUM: 141 mmol/L (ref 135–146)
Total Bilirubin: 0.5 mg/dL (ref 0.2–1.2)
Total Protein: 6.6 g/dL (ref 6.1–8.1)

## 2017-10-05 LAB — HEMOGLOBIN A1C
Hgb A1c MFr Bld: 11.1 % of total Hgb — ABNORMAL HIGH (ref <5.7)
Mean Plasma Glucose: 272 (calc)
eAG (mmol/L): 15.1 (calc)

## 2017-10-05 LAB — LIPID PANEL
CHOL/HDL RATIO: 6.5 (calc) — AB (ref <5.0)
CHOLESTEROL: 261 mg/dL — AB (ref <200)
HDL: 40 mg/dL — ABNORMAL LOW (ref >50)
Non-HDL Cholesterol (Calc): 221 mg/dL (calc) — ABNORMAL HIGH (ref <130)
Triglycerides: 475 mg/dL — ABNORMAL HIGH (ref <150)

## 2017-10-05 LAB — VITAMIN D 25 HYDROXY (VIT D DEFICIENCY, FRACTURES): VIT D 25 HYDROXY: 25 ng/mL — AB (ref 30–100)

## 2017-10-05 LAB — TSH: TSH: 1.47 mIU/L (ref 0.40–4.50)

## 2017-10-05 LAB — MAGNESIUM: MAGNESIUM: 1.9 mg/dL (ref 1.5–2.5)

## 2017-10-19 ENCOUNTER — Other Ambulatory Visit: Payer: Self-pay | Admitting: Internal Medicine

## 2017-10-20 ENCOUNTER — Other Ambulatory Visit: Payer: Self-pay | Admitting: Internal Medicine

## 2017-10-20 MED ORDER — TIZANIDINE HCL 4 MG PO TABS: 4.0000 mg | ORAL_TABLET | Freq: Four times a day (QID) | ORAL | 0 refills | Status: DC | PRN

## 2017-10-25 ENCOUNTER — Ambulatory Visit: Payer: Self-pay | Admitting: Internal Medicine

## 2017-10-25 ENCOUNTER — Encounter

## 2017-10-27 ENCOUNTER — Other Ambulatory Visit: Payer: Self-pay | Admitting: Internal Medicine

## 2017-11-01 ENCOUNTER — Other Ambulatory Visit: Payer: Self-pay | Admitting: Internal Medicine

## 2017-11-04 ENCOUNTER — Ambulatory Visit (INDEPENDENT_AMBULATORY_CARE_PROVIDER_SITE_OTHER): Payer: PPO | Admitting: Cardiovascular Disease

## 2017-11-04 ENCOUNTER — Other Ambulatory Visit: Payer: Self-pay | Admitting: Internal Medicine

## 2017-11-04 ENCOUNTER — Encounter: Payer: Self-pay | Admitting: Cardiovascular Disease

## 2017-11-04 VITALS — BP 128/62 | HR 73 | Ht 62.5 in | Wt 249.2 lb

## 2017-11-04 DIAGNOSIS — E785 Hyperlipidemia, unspecified: Secondary | ICD-10-CM

## 2017-11-04 DIAGNOSIS — Z9861 Coronary angioplasty status: Secondary | ICD-10-CM

## 2017-11-04 DIAGNOSIS — I2510 Atherosclerotic heart disease of native coronary artery without angina pectoris: Secondary | ICD-10-CM

## 2017-11-04 DIAGNOSIS — I10 Essential (primary) hypertension: Secondary | ICD-10-CM | POA: Diagnosis not present

## 2017-11-04 MED ORDER — ATORVASTATIN CALCIUM 80 MG PO TABS: ORAL_TABLET | ORAL | 0 refills | Status: DC

## 2017-11-04 MED ORDER — FISH OIL 1000 MG PO CAPS: 1000.0000 mg | ORAL_CAPSULE | Freq: Two times a day (BID) | ORAL | 0 refills | Status: DC

## 2017-11-04 NOTE — Patient Instructions (Signed)
Medication Instructions:  STOP Plavix START over the counter fish oil 1000 mg two times daily  Follow-Up: 6 months with Dr. Oval Linsey  Any Other Special Instructions Will Be Listed Below (If Applicable).     If you need a refill on your cardiac medications before your next appointment, please call your pharmacy.

## 2017-11-04 NOTE — Progress Notes (Signed)
Cardiology Office Note   Date:  11/04/2017   ID:  Kijana, Estock March 09, 1941, MRN [id number]  PCP:  [name], MD  Cardiologist:   Skeet Latch, MD   Chief Complaint  Patient presents with  . Follow-up      History of Present Illness: Janet Choi is a 77 y.o. female with CAD s/p NSTEMI with PCI of the LAD and LCx, hypertensive heart disease, hyperlipidemia, OSA, diabetes, morbid obesity and CKD III who presents for follow up.  Janet Choi presented to [facility] 04/2016 with NSTEMI.  She underwent LHC that revealed 95-99% mid LAD and LCx lesions that were successfully opened with Onyx Resolute DESs. Rash PCI of the left circumflex artery was complicated by microembolization. Left ventriculogram revealed LVEF 40-50%.  Subsequent echo revealed LVEF 50-55% with akinesis of the distal septal and apical myocardium. There is also akinesis of the apical inferior myocardium and grade 1 diastolic dysfunction.   She was readmitted with chest pain on the same day she was discharged from the hospital.  This was not felt to be recurrent ischemia but ticagrelor was switched to clopidogrel.  During that hospitalization Prozac was stopped due to interaction with clopidogrel. She followed up with [facility] on 04/26/16 and was feeling weak but otherwise well.  She was again seen in the ED with chest pain on 12/20.  Imdur was added to her regimen.  Since her last appointment Janet Choi has been doing well.  She had one episode of indigestion that she thinks may have been angina.  She took one nitroglycerin and has had no recurrent chest pain.  She is not getting any exercise because she is limited by pain in bilateral buttocks.  She had an injection that did not help her symptoms.  She denies any shortness of breath or lower extremity edema.  Her main complaint today is stress from her family.  She has financial troubles that she attributes to her adult son.  She also has a sister with  advanced dementia who is currently living with her.  She is frustrated by weight gain that she attributes to insulin making her hungry.  She hopes to get her buttock pain addressed so that she can start back exercising.  Since her last appointment she was noted to have severely elevated cholesterol including hypertriglyceridemia.  Her PCP increased her atorvastatin from 40 mg to 80 mg and has instructed her to limit carbohydrate intake.  These will be rechecked in 2 months.    Past Medical History:  Diagnosis Date  . Anemia    hx (04/14/2016)  . Anxiety   . Arthritis    "severe in my back; hands; ankles" (04/14/2016)  . Basal cell carcinoma    "several burned off; one cut off"  . CAD in native artery    a. NSTEMI 04/2016 - s/p DES toLAD and LCx. PCI to LCx notable for microembolization during cath.  . Chest pain- reslved with stopping Brilinta now on Plaix 04/16/2016  . Chronic lower back pain   . CKD (chronic kidney disease), stage III ([place])   . Depression   . DJD (degenerative joint disease)   . Family history of adverse reaction to anesthesia    "half-sister used to get real sick" (04/14/2016)  . GERD (gastroesophageal reflux disease)   . History of gout   . Hyperlipidemia   . Hypertension   . IBS (irritable bowel syndrome)   . Ischemic cardiomyopathy    a.  04/2016: EF 40-50% by cath, 50-55% +WMA by echo.  . 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 ([place]) ~ 2015  . Morbid obesity ([place])   . NSTEMI (non-ST elevated myocardial infarction) ([place]) 04/14/2016  . OSA on CPAP   . Peripheral neuropathy   . Peripheral vascular disease ([place])   . RLS (restless legs syndrome)   . Spinal stenosis   . Type II diabetes mellitus ([place])   . Vitamin D deficiency     Past Surgical History:  Procedure Laterality Date  . APPENDECTOMY    . BACK SURGERY    . BASAL CELL CARCINOMA EXCISION Left    leg  . CARDIAC CATHETERIZATION N/A 04/15/2016   Procedure: Left Heart Cath and Coronary Angiography;   Surgeon: Belva Crome, MD;  Location: [facility];  Service: Cardiovascular;  Laterality: N/A;  . CARDIAC CATHETERIZATION N/A 04/15/2016   Procedure: Coronary Stent Intervention;  Surgeon: Belva Crome, MD;  Location: [facility];  Service: Cardiovascular;  Laterality: N/A;  Mid LAD Mid CFX  . CATARACT EXTRACTION, BILATERAL Bilateral   . JOINT REPLACEMENT    . LUMBAR DISC SURGERY  X 2  . MELANOMA EXCISION Left    "towards the back of my neck"  . SHOULDER ARTHROSCOPY W/ ROTATOR CUFF REPAIR Left   . SHOULDER OPEN ROTATOR CUFF REPAIR Right   . TOTAL KNEE ARTHROPLASTY Bilateral      Current Outpatient Medications  Medication Sig Dispense Refill  . acetaminophen (TYLENOL) 325 MG tablet Take 2 tablets (650 mg total) by mouth every 4 (four) hours as needed for headache or mild pain.    Marland Kitchen allopurinol (ZYLOPRIM) 100 MG tablet Take 1 tablet daily to prevent Gout 90 tablet 3  . aspirin EC 81 MG EC tablet Take 1 tablet (81 mg total) by mouth daily.    Marland Kitchen atorvastatin (LIPITOR) 80 MG tablet Take 1 tablet (80 mg total) by mouth daily at 6 PM. Need office visit 90 tablet 0  . buPROPion (WELLBUTRIN XL) 150 MG 24 hr tablet TAKE 1 TABLET BY MOUTH EVERY DAY 90 tablet 1  . Cholecalciferol ([employer]S VIT D 5000 HIGH-POTENCY) 5000 units capsule Take 5,000 Units by mouth 2 (two) times daily.     . colchicine 0.6 MG tablet Take 2 tablets first day of gout flare, then one a day until gout flare is better 90 tablet 1  . escitalopram (LEXAPRO) 10 MG tablet Take 1 tablet (10 mg total) by mouth daily. 90 tablet 3  . ezetimibe (ZETIA) 10 MG tablet Take 1 tablet (10 mg total) by mouth daily. 90 tablet 3  . FLECTOR 1.3 % PTCH APPLY 1 PATCH EVERY 12 HOURS AS NEEDED THEN OFF 12 HOURS 30 patch 0  . furosemide (LASIX) 40 MG tablet TAKE 1 TABLET 3 TIMES A DAY FOR FLUID RETENTION 270 tablet 1  . gabapentin (NEURONTIN) 800 MG tablet Take 1/2 to 1  tablet 3 to 4 x / day for chronic pain 360 tablet 3  . Insulin Glargine  (BASAGLAR KWIKPEN) 100 UNIT/ML SOPN INJECT 70 UNITS INTO THE SKIN AT BEDTIME. 75 pen 0  . Insulin Pen Needle (CAREFINE PEN NEEDLES) 32G X 4 MM MISC Use 4x a day 300 each 11  . Lancets (ONETOUCH ULTRASOFT) lancets Use as instructed 100 each 12  . liraglutide (VICTOZA) 18 MG/3ML SOPN Inject 0.3 mLs (1.8 mg total) into the skin once a week for 12 doses. 6 pen 3  . losartan (COZAAR) 50 MG  tablet TAKE 1 TABLET BY MOUTH EVERY DAY 90 tablet 1  . nitroGLYCERIN (NITROSTAT) 0.4 MG SL tablet Place 1 tablet (0.4 mg total) under the tongue every 5 (five) minutes x 3 doses as needed for chest pain. 25 tablet 4  . NOVOLOG FLEXPEN 100 UNIT/ML FlexPen INJECT 16-20 UNITS INTO THE SKIN 3 (THREE) TIMES DAILY WITH MEALS. 21 pen 6  . ONETOUCH VERIO test strip USE AS INSTRUCTED TO CHECK SUGAR 2 TIMES DAILY. 200 each 4  . rOPINIRole (REQUIP) 3 MG tablet Take 1 tablet (3 mg total) 4 (four) times daily by mouth. 360 tablet 1  . tiZANidine (ZANAFLEX) 4 MG tablet Take 1 tablet (4 mg total) by mouth every 6 (six) hours as needed for muscle spasms. 90 tablet 0  . Omega-3 Fatty Acids (FISH OIL) 1000 MG CAPS Take 1 capsule (1,000 mg total) by mouth 2 (two) times daily.  0   No current facility-administered medications for this visit.     Allergies:   Brilinta [ticagrelor]; Aspirin; Nsaids; Minocycline hcl; Oruvail [ketoprofen]; Tricor [fenofibrate]; Vasotec [enalaprilat]; and Zinc    Social History:  The patient  reports that she quit smoking about 34 years ago. Her smoking use included cigarettes. She has a 62.50 pack-year smoking history. She has quit using smokeless tobacco. She reports that she does not drink alcohol or use drugs.   Family History:  The patient's family history includes AAA (abdominal aortic aneurysm) in her father; Heart disease in her mother and son; Kidney disease in her father.    ROS:  Please see the history of present illness.   Otherwise, review of systems are positive for none.   All other  systems are reviewed and negative.    PHYSICAL EXAM: VS:  BP 128/62   Pulse 73   Ht 5' 2.5" (1.588 m)   Wt 249 lb 3.2 oz (113 kg)   BMI 44.85 kg/m  , BMI Body mass index is 44.85 kg/m. GENERAL:  Well appearing HEENT: Pupils equal round and reactive, fundi not visualized, oral mucosa unremarkable NECK:  No jugular venous distention, waveform within normal limits, carotid upstroke brisk and symmetric, no bruits LUNGS:  Clear to auscultation bilaterally HEART:  RRR.  PMI not displaced or sustained,S1 and S2 within normal limits, no S3, no S4, no clicks, no rubs, no murmurs ABD:  Flat, positive bowel sounds normal in frequency in pitch, no bruits, no rebound, no guarding, no midline pulsatile mass, no hepatomegaly, no splenomegaly EXT:  2 plus pulses throughout, no edema, no cyanosis no clubbing SKIN:  No rashes no nodules NEURO:  Cranial nerves II through XII grossly intact, motor grossly intact throughout PSYCH:  Cognitively intact, oriented to person place and time   EKG:  EKG is not ordered today.  Echo 04/15/16: Study Conclusions  - Left ventricle: The cavity size was normal. There was moderate   focal basal and mild concentric hypertrophy. Systolic function   was normal. The estimated ejection fraction was in the range of   50% to 55%. There is akinesis of the distal septal and apical   myocardium. There is akinesis of the apicalinferior myocardium.   There was an increased relative contribution of atrial   contraction to ventricular filling. Doppler parameters are   consistent with abnormal left ventricular relaxation (grade 1   diastolic dysfunction). Doppler parameters are consistent with   high ventricular filling pressure. - Aortic valve: Mildly to moderately calcified annulus. Mildly   calcified leaflets. - Mitral valve: Calcified  annulus. Mildly calcified leaflets .   There was trivial regurgitation. - Atrial septum: No defect or patent foramen ovale was  identified.  LHC12/7/17: Non-ST elevation myocardial infarction with identification of 81-44% segmental mid LAD stenosis and focal eccentric 95-99% mid circumflex stenosis.  The first diagonal is small and contains segmental 80% stenosis. The coronaries are otherwise widely patent.  Successful drug-eluting stent using Onyx Resolute to reduce a 95% mid LAD stenosis to 0% with TIMI grade 3 flow. Final stent diameter 3.0 mm.  Successful drug-eluting stent using Onyx Resolute to reduce a 95% mid circumflex stenosis to 0% with TIMI grade 3 flow. Post stent diameter 3.5 mm. Intervention on this vessel was, complicated by microembolization to the the most distal obtuse marginal beyond a bifurcation.  Mild hypokinesis of the anterior wall. EF 40-50%.  Recent Labs: 10/04/2017: ALT 14; BUN 22; Creat 1.22; Hemoglobin 13.4; Magnesium 1.9; Platelets 184; Potassium 4.2; Sodium 141; TSH 1.47    Lipid Panel    Component Value Date/Time   CHOL 261 (H) 10/04/2017 1620   TRIG 475 (H) 10/04/2017 1620   HDL 40 (L) 10/04/2017 1620   CHOLHDL 6.5 (H) 10/04/2017 1620   VLDL 64 (H) 09/27/2016 1527   [place]  10/04/2017 1620     Comment:     . LDL cholesterol not calculated. Triglyceride levels greater than 400 mg/dL invalidate calculated LDL results. . Reference range: <100 . Desirable range <100 mg/dL for primary prevention;   <70 mg/dL for patients with CHD or diabetic patients  with > or = 2 CHD risk factors. Marland Kitchen LDL-C is now calculated using the Martin-Hopkins  calculation, which is a validated novel method providing  better accuracy than the Friedewald equation in the  estimation of LDL-C.  Cresenciano Genre et al. Annamaria Helling. 8185;631(49): 2061-2068  (http://education.[employer].com/faq/FAQ164)       Wt Readings from Last 3 Encounters:  11/04/17 249 lb 3.2 oz (113 kg)  10/04/17 249 lb 12.8 oz (113.3 kg)  09/14/17 246 lb (111.6 kg)      ASSESSMENT AND PLAN:  # CAD s/p NSTEMI:  Janet Choi had  an NSTEMI 04/2016.  She is doing well and has no angina.  We did discuss the importance of increasing her exercise.  She has access to a pool and will start working out there.  Continue aspirin and atorvastatin.  She will have lipids checked with her PCP.  We will stop clopidogrel given that her stent was placed over a year ago.  She has document heart rates in the 60s in the past  # Hyperlipidemia: # Hypertriglyceridemia:    Atorvastatin was increased as above and will be checked again in 2 months.  Continue Zetia and add fish oil 1000mg  bid.  Agree with reducing carbohydrates.  # Hypertension: Blood pressure was initially elevated but better on repeat.  Continue losartan.  # Morbid obesity: Encouraged to increase 90-150 minutes of exercise weekly.   Current medicines are reviewed at length with the patient today.  The patient has concerns regarding medicines.  The following changes have been made:  no change  Labs/ tests ordered today include:   No orders of the defined types were placed in this encounter.    Disposition:   FU with Janet Lindon C. Oval Linsey, MD, [facility] in 6 months    Signed, Ria Redcay C. Oval Linsey, MD, [facility]  11/04/2017 10:41 AM    [place]

## 2017-11-08 DIAGNOSIS — M25552 Pain in left hip: Secondary | ICD-10-CM | POA: Diagnosis not present

## 2017-11-08 DIAGNOSIS — M25551 Pain in right hip: Secondary | ICD-10-CM | POA: Diagnosis not present

## 2017-12-04 ENCOUNTER — Other Ambulatory Visit: Payer: Self-pay | Admitting: Internal Medicine

## 2017-12-11 ENCOUNTER — Other Ambulatory Visit: Payer: Self-pay | Admitting: Cardiovascular Disease

## 2017-12-18 ENCOUNTER — Other Ambulatory Visit: Payer: Self-pay | Admitting: Cardiology

## 2017-12-18 ENCOUNTER — Other Ambulatory Visit: Payer: Self-pay | Admitting: Internal Medicine

## 2017-12-19 NOTE — Telephone Encounter (Signed)
Rx request sent to pharmacy.  

## 2017-12-27 ENCOUNTER — Other Ambulatory Visit: Payer: Self-pay | Admitting: Physician Assistant

## 2017-12-31 DIAGNOSIS — F419 Anxiety disorder, unspecified: Secondary | ICD-10-CM | POA: Diagnosis not present

## 2017-12-31 DIAGNOSIS — K219 Gastro-esophageal reflux disease without esophagitis: Secondary | ICD-10-CM | POA: Diagnosis not present

## 2017-12-31 DIAGNOSIS — Z87891 Personal history of nicotine dependence: Secondary | ICD-10-CM | POA: Diagnosis not present

## 2017-12-31 DIAGNOSIS — F329 Major depressive disorder, single episode, unspecified: Secondary | ICD-10-CM | POA: Diagnosis not present

## 2017-12-31 DIAGNOSIS — M79602 Pain in left arm: Secondary | ICD-10-CM | POA: Diagnosis not present

## 2017-12-31 DIAGNOSIS — R079 Chest pain, unspecified: Secondary | ICD-10-CM | POA: Diagnosis not present

## 2017-12-31 DIAGNOSIS — K589 Irritable bowel syndrome without diarrhea: Secondary | ICD-10-CM | POA: Diagnosis not present

## 2017-12-31 DIAGNOSIS — E118 Type 2 diabetes mellitus with unspecified complications: Secondary | ICD-10-CM | POA: Diagnosis not present

## 2017-12-31 DIAGNOSIS — N189 Chronic kidney disease, unspecified: Secondary | ICD-10-CM | POA: Diagnosis not present

## 2017-12-31 DIAGNOSIS — I129 Hypertensive chronic kidney disease with stage 1 through stage 4 chronic kidney disease, or unspecified chronic kidney disease: Secondary | ICD-10-CM | POA: Diagnosis not present

## 2017-12-31 DIAGNOSIS — Z79899 Other long term (current) drug therapy: Secondary | ICD-10-CM | POA: Diagnosis not present

## 2017-12-31 DIAGNOSIS — E785 Hyperlipidemia, unspecified: Secondary | ICD-10-CM | POA: Diagnosis not present

## 2017-12-31 DIAGNOSIS — I2510 Atherosclerotic heart disease of native coronary artery without angina pectoris: Secondary | ICD-10-CM | POA: Diagnosis not present

## 2017-12-31 DIAGNOSIS — R9389 Abnormal findings on diagnostic imaging of other specified body structures: Secondary | ICD-10-CM | POA: Diagnosis not present

## 2017-12-31 DIAGNOSIS — Z794 Long term (current) use of insulin: Secondary | ICD-10-CM | POA: Diagnosis not present

## 2017-12-31 DIAGNOSIS — M549 Dorsalgia, unspecified: Secondary | ICD-10-CM | POA: Diagnosis not present

## 2017-12-31 DIAGNOSIS — I4510 Unspecified right bundle-branch block: Secondary | ICD-10-CM | POA: Diagnosis not present

## 2017-12-31 DIAGNOSIS — M1990 Unspecified osteoarthritis, unspecified site: Secondary | ICD-10-CM | POA: Diagnosis not present

## 2017-12-31 DIAGNOSIS — E1122 Type 2 diabetes mellitus with diabetic chronic kidney disease: Secondary | ICD-10-CM | POA: Diagnosis not present

## 2017-12-31 DIAGNOSIS — Z955 Presence of coronary angioplasty implant and graft: Secondary | ICD-10-CM | POA: Diagnosis not present

## 2017-12-31 DIAGNOSIS — R0600 Dyspnea, unspecified: Secondary | ICD-10-CM | POA: Diagnosis not present

## 2017-12-31 DIAGNOSIS — R0789 Other chest pain: Secondary | ICD-10-CM | POA: Diagnosis not present

## 2017-12-31 DIAGNOSIS — G8929 Other chronic pain: Secondary | ICD-10-CM | POA: Diagnosis not present

## 2017-12-31 DIAGNOSIS — I10 Essential (primary) hypertension: Secondary | ICD-10-CM | POA: Diagnosis not present

## 2017-12-31 DIAGNOSIS — I959 Hypotension, unspecified: Secondary | ICD-10-CM | POA: Diagnosis not present

## 2017-12-31 DIAGNOSIS — I252 Old myocardial infarction: Secondary | ICD-10-CM | POA: Diagnosis not present

## 2017-12-31 DIAGNOSIS — R072 Precordial pain: Secondary | ICD-10-CM | POA: Diagnosis not present

## 2018-01-01 DIAGNOSIS — E118 Type 2 diabetes mellitus with unspecified complications: Secondary | ICD-10-CM | POA: Diagnosis not present

## 2018-01-01 DIAGNOSIS — R079 Chest pain, unspecified: Secondary | ICD-10-CM | POA: Diagnosis not present

## 2018-01-01 DIAGNOSIS — R072 Precordial pain: Secondary | ICD-10-CM | POA: Diagnosis not present

## 2018-01-01 DIAGNOSIS — Z794 Long term (current) use of insulin: Secondary | ICD-10-CM | POA: Diagnosis not present

## 2018-01-03 MED ORDER — ROPINIROLE HCL 1 MG PO TABS
3.00 | ORAL_TABLET | ORAL | Status: DC
Start: 2018-01-01 — End: 2018-01-03

## 2018-01-03 MED ORDER — INSULIN GLARGINE 100 UNIT/ML ~~LOC~~ SOLN
1.00 | SUBCUTANEOUS | Status: DC
Start: 2018-01-01 — End: 2018-01-03

## 2018-01-03 MED ORDER — INSULIN LISPRO 100 UNIT/ML ~~LOC~~ SOLN
1.00 | SUBCUTANEOUS | Status: DC
Start: ? — End: 2018-01-03

## 2018-01-03 MED ORDER — ASPIRIN EC 81 MG PO TBEC
81.00 | DELAYED_RELEASE_TABLET | ORAL | Status: DC
Start: 2018-01-02 — End: 2018-01-03

## 2018-01-03 MED ORDER — ALLOPURINOL 100 MG PO TABS
100.00 | ORAL_TABLET | ORAL | Status: DC
Start: 2018-01-02 — End: 2018-01-03

## 2018-01-03 MED ORDER — BUPROPION HCL ER (XL) 150 MG PO TB24
300.00 | ORAL_TABLET | ORAL | Status: DC
Start: 2018-01-02 — End: 2018-01-03

## 2018-01-03 MED ORDER — ESCITALOPRAM OXALATE 10 MG PO TABS
10.00 | ORAL_TABLET | ORAL | Status: DC
Start: 2018-01-02 — End: 2018-01-03

## 2018-01-03 MED ORDER — GENERIC EXTERNAL MEDICATION
10.00 | Status: DC
Start: ? — End: 2018-01-03

## 2018-01-03 MED ORDER — GENERIC EXTERNAL MEDICATION
1.00 | Status: DC
Start: ? — End: 2018-01-03

## 2018-01-03 MED ORDER — NITROGLYCERIN 0.4 MG SL SUBL
0.40 | SUBLINGUAL_TABLET | SUBLINGUAL | Status: DC
Start: ? — End: 2018-01-03

## 2018-01-03 MED ORDER — LOSARTAN POTASSIUM 50 MG PO TABS
50.00 | ORAL_TABLET | ORAL | Status: DC
Start: 2018-01-02 — End: 2018-01-03

## 2018-01-03 MED ORDER — ATORVASTATIN CALCIUM 80 MG PO TABS
80.00 | ORAL_TABLET | ORAL | Status: DC
Start: 2018-01-01 — End: 2018-01-03

## 2018-01-03 MED ORDER — GENERIC EXTERNAL MEDICATION
650.00 | Status: DC
Start: ? — End: 2018-01-03

## 2018-01-03 MED ORDER — GABAPENTIN 400 MG PO CAPS
800.00 | ORAL_CAPSULE | ORAL | Status: DC
Start: ? — End: 2018-01-03

## 2018-01-03 MED ORDER — SODIUM CHLORIDE 0.9 % IV SOLN
10.00 | INTRAVENOUS | Status: DC
Start: ? — End: 2018-01-03

## 2018-01-03 MED ORDER — EZETIMIBE 10 MG PO TABS
10.00 | ORAL_TABLET | ORAL | Status: DC
Start: 2018-01-02 — End: 2018-01-03

## 2018-01-03 MED ORDER — INSULIN LISPRO 100 UNIT/ML ~~LOC~~ SOLN
1.00 | SUBCUTANEOUS | Status: DC
Start: 2018-01-01 — End: 2018-01-03

## 2018-01-03 MED ORDER — INSULIN GLARGINE 100 UNIT/ML ~~LOC~~ SOLN
1.00 | SUBCUTANEOUS | Status: DC
Start: ? — End: 2018-01-03

## 2018-01-03 MED ORDER — SENNA-DOCUSATE SODIUM 8.6-50 MG PO TABS
1.00 | ORAL_TABLET | ORAL | Status: DC
Start: ? — End: 2018-01-03

## 2018-01-04 ENCOUNTER — Ambulatory Visit (INDEPENDENT_AMBULATORY_CARE_PROVIDER_SITE_OTHER): Payer: PPO | Admitting: Physician Assistant

## 2018-01-04 ENCOUNTER — Encounter: Payer: Self-pay | Admitting: Physician Assistant

## 2018-01-04 VITALS — BP 110/66 | HR 85 | Temp 99.5°F | Resp 18 | Ht 62.5 in | Wt 255.8 lb

## 2018-01-04 DIAGNOSIS — Z794 Long term (current) use of insulin: Secondary | ICD-10-CM | POA: Diagnosis not present

## 2018-01-04 DIAGNOSIS — E1165 Type 2 diabetes mellitus with hyperglycemia: Secondary | ICD-10-CM

## 2018-01-04 DIAGNOSIS — E1140 Type 2 diabetes mellitus with diabetic neuropathy, unspecified: Secondary | ICD-10-CM

## 2018-01-04 DIAGNOSIS — N183 Chronic kidney disease, stage 3 unspecified: Secondary | ICD-10-CM

## 2018-01-04 DIAGNOSIS — R079 Chest pain, unspecified: Secondary | ICD-10-CM

## 2018-01-04 DIAGNOSIS — Z79899 Other long term (current) drug therapy: Secondary | ICD-10-CM

## 2018-01-04 DIAGNOSIS — E782 Mixed hyperlipidemia: Secondary | ICD-10-CM

## 2018-01-04 DIAGNOSIS — E1122 Type 2 diabetes mellitus with diabetic chronic kidney disease: Secondary | ICD-10-CM

## 2018-01-04 DIAGNOSIS — I10 Essential (primary) hypertension: Secondary | ICD-10-CM | POA: Diagnosis not present

## 2018-01-04 DIAGNOSIS — R0602 Shortness of breath: Secondary | ICD-10-CM | POA: Diagnosis not present

## 2018-01-04 DIAGNOSIS — IMO0002 Reserved for concepts with insufficient information to code with codable children: Secondary | ICD-10-CM

## 2018-01-04 MED ORDER — IPRATROPIUM-ALBUTEROL 0.5-2.5 (3) MG/3ML IN SOLN
3.0000 mL | Freq: Once | RESPIRATORY_TRACT | Status: AC
  Administered 2018-01-04: 3 mL via RESPIRATORY_TRACT

## 2018-01-04 MED ORDER — AMOXICILLIN-POT CLAVULANATE 250-125 MG PO TABS: 1.0000 | ORAL_TABLET | Freq: Two times a day (BID) | ORAL | 0 refills | Status: DC

## 2018-01-04 NOTE — Patient Instructions (Addendum)
Follow up with cardiology  I'm going to send you to pulmonary to get pulmonary function tests  Can do steroid inhaler, NEED TO DO DAILY, this is NOT a rescue inhaler so if you are acutely short of breath please use your albuterol or call 911.  Do 1 puff once a day.  Do before you brush your teeth OR wash your mouth afterwards.  IF YOU DO NOT [place] YOUR MOUTH OUT IT CAN CAUSE YEAST Can do 2 tsp vinegar with water and switch to help prevent yeast or help yeast in your mouth.   Go to the ER if any chest pain, shortness of breath, nausea, dizziness, severe HA, changes vision/speech  Stay on the allergy pill   Shortness of Breath, Adult Shortness of breath is when a person has trouble breathing enough air, or when a person feels like she or he is having trouble breathing in enough air. Shortness of breath could be a sign of medical problem. Follow these instructions at home: Pay attention to any changes in your symptoms. Take these actions to help with your condition:  Do not smoke. Smoking is a common cause of shortness of breath. If you smoke and you need help quitting, ask your health care provider.  Avoid things that can irritate your airways, such as: ? Mold. ? Dust. ? Air pollution. ? Chemical fumes. ? Things that can cause allergy symptoms (allergens), if you have allergies.  Keep your living space clean and free of mold and dust.  Rest as needed. Slowly return to your usual activities.  Take over-the-counter and prescription medicines, including oxygen and inhaled medicines, only as told by your health care provider.  Keep all follow-up visits as told by your health care provider. This is important.  Contact a health care provider if:  Your condition does not improve as soon as expected.  You have a hard time doing your normal activities, even after you rest.  You have new symptoms. Get help right away if:  Your shortness of breath gets worse.  You have shortness of  breath when you are resting.  You feel light-headed or you faint.  You have a cough that is not controlled with medicines.  You cough up blood.  You have pain with breathing.  You have pain in your chest, arms, shoulders, or abdomen.  You have a fever.  You cannot walk up stairs or exercise the way that you normally do. This information is not intended to replace advice given to you by your health care provider. Make sure you discuss any questions you have with your health care provider. Document Released: 01/19/2001 Document Revised: 11/15/2015 Document Reviewed: 10/02/2015 Elsevier Interactive Patient Education  [employer].

## 2018-01-04 NOTE — Progress Notes (Signed)
HOSPITAL FOLLOW UP  Assessment:   Hospital discharge meds were reviewed, and reconciled with the patient.   Over 40 minutes of exam, counseling, chart review, and complex, high/moderate level critical decision making was performed this visit.   Shortness of breath and chest pain -     Ambulatory referral to Pulmonology -     ipratropium-albuterol (DUONEB) 0.5-2.5 (3) MG/3ML nebulizer solution 3 mL FELT BETTER AFTER DUONEB, MAY NEED PFTS -     amoxicillin-clavulanate (AUGMENTIN) 250-125 MG tablet; Take 1 tablet by mouth 2 (two) times daily. For 7 days. Take with food. Will get VQ scan to rule out PE and [place] bilateral legs to rule out PE PENDING Ddimer WILL GO TO ER IF ANY WORSENING SOB OR CP  Essential hypertension - continue medications, DASH diet, exercise and monitor at home. Call if greater than 130/80.  -     CBC with Differential/Platelet -     COMPLETE METABOLIC PANEL WITH GFR -     TSH  Hyperlipidemia, mixed check lipids decrease fatty foods increase activity.  -     Lipid panel  Type 2 diabetes mellitus with stage 3 chronic kidney disease, with long-term current use of insulin (HCC) -     Hemoglobin A1c  Medication management -     Magnesium  Morbid obesity - follow up 3 months for progress monitoring - increase veggies, decrease carbs - long discussion about weight loss, diet, and exercise  Diabetic neuropathy, painful ([place]) MONITOR FEET  Uncontrolled type 2 diabetes mellitus with stage 3 chronic kidney disease, with long-term current use of insulin ([place]) Discussed general issues about diabetes pathophysiology and management., Educational material distributed., Suggested low cholesterol diet., Encouraged aerobic exercise., Discussed foot care., Reminded to get yearly retinal exam.    Future Appointments  Date Time Provider [place]  04/12/2018  2:30 PM [name], MD [facility] None  04/19/2018  1:20 PM Skeet Latch, MD [facility]   06/29/2018 10:00 AM Janet Mutters, PA-C [facility] None  07/04/2018 11:15 AM Janet Mutters, PA-C [facility] None     Subjective:   Janet Choi is a 77 y.o. female who presents for hospital follow up and 3 month follow up on hypertension,diabetes, hyperlipidemia, vitamin D def, poor compliance.   The discharge summary, medications, and diagnostic test results were reviewed before meeting with the patient.   Her blood pressure has been controlled at home, she is on verapamil, lasix, today [name] is BP: 110/66 She does not workout. She denies chest pain, shortness of breath, dizziness.  Follows with Dr. Berenice Primas and Dr. Mina Marble for chronic pain.   She had NSTEMI with PCI LAD and LCx 04/2016, on plavix follows Cardio, Dr. Oval Linsey, off ASA on her own accord. [place] was at the beach and had trouble breathing while walking on the beach, went to er was admitted 24-25th, had normal CXR, she had a normal stress test.  She states she has had cough wheezing, SOB, fatigue, worse in the morning. She has had left leg swelling worse than right, some chest tightness. Will get VQ scan to rule out PE and [place] bilateral legs to rule out PE.     Lab Results  Component Value Date   GFRNONAA 43 (L) 10/04/2017     She is on cholesterol medication, lipitor and zetia denies myalgias. Her cholesterol is at goal. The cholesterol last visit was:   Lab Results  Component Value Date   CHOL 261 (H) 10/04/2017   HDL 40 (L)  10/04/2017   [place]  10/04/2017     Comment:     . LDL cholesterol not calculated. Triglyceride levels greater than 400 mg/dL invalidate calculated LDL results. . Reference range: <100 . Desirable range <100 mg/dL for primary prevention;   <70 mg/dL for patients with CHD or diabetic patients  with > or = 2 CHD risk factors. Marland Kitchen LDL-C is now calculated using the Martin-Hopkins  calculation, which is a validated novel method providing  better accuracy than the Friedewald equation in the   estimation of LDL-C.  Cresenciano Genre et al. Annamaria Helling. 5176;160(73): 2061-2068  (http://education.[employer].com/faq/FAQ164)    TRIG 475 (H) 10/04/2017   CHOLHDL 6.5 (H) 10/04/2017   She has been working on diet and exercise for diabetes. She has one touch ultra. She is following Dr. 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 Last A1C in the office was:  Lab Results  Component Value Date   HGBA1C 11.1 (H) 10/04/2017   Lab Results  Component Value Date   CREATININE 1.22 (H) 10/04/2017   BUN 22 10/04/2017   NA 141 10/04/2017   K 4.2 10/04/2017   CL 101 10/04/2017   CO2 29 10/04/2017   Last GFR, she does not see nephrology.  Lab Results  Component Value Date   GFRNONAA 43 (L) 10/04/2017   Patient is on Vitamin D supplement. Lab Results  Component Value Date   VD25OH 25 (L) 10/04/2017   BMI is Body mass index is 46.04 kg/m., she is working on diet and exercise and has done well. She states she just lost her best friend of 33 years, her sister (72 years old ) recently diagnosed with dementia. She has been depressed and worried about her memory. She states he is hungry all the time.  Wt Readings from Last 3 Encounters:  01/04/18 255 lb 12.8 oz (116 kg)  11/04/17 249 lb 3.2 oz (113 kg)  10/04/17 249 lb 12.8 oz (113.3 kg)    Medication Review Current Outpatient Medications on File Prior to Visit  Medication Sig Dispense Refill  . acetaminophen (TYLENOL) 325 MG tablet Take 2 tablets (650 mg total) by mouth every 4 (four) hours as needed for headache or mild pain.    Marland Kitchen allopurinol (ZYLOPRIM) 100 MG tablet Take 1 tablet daily to prevent Gout 90 tablet 3  . aspirin EC 81 MG EC tablet Take 1 tablet (81 mg total) by mouth daily.    Marland Kitchen atorvastatin (LIPITOR) 80 MG tablet TAKE 1 TABLET (80 MG TOTAL) BY MOUTH DAILY AT 6 PM. 90 tablet 0  . buPROPion (WELLBUTRIN XL) 150 MG 24 hr tablet TAKE 1 TABLET  BY MOUTH EVERY DAY 90 tablet 1  . Cholecalciferol ([employer] VIT D 5000 HIGH-POTENCY) 5000 units capsule Take 5,000 Units by mouth 2 (two) times daily.     . colchicine 0.6 MG tablet Take 2 tablets first day of gout flare, then one a day until gout flare is better 90 tablet 1  . escitalopram (LEXAPRO) 10 MG tablet Take 1 tablet (10 mg total) by mouth daily. 90 tablet 3  . FLECTOR 1.3 % PTCH APPLY 1 PATCH EVERY 12 HOURS AS NEEDED THEN OFF 12 HOURS 30 patch 0  . furosemide (LASIX) 40 MG tablet TAKE 1 TABLET 3 TIMES A DAY FOR FLUID RETENTION 270 tablet 1  . gabapentin (NEURONTIN) 800 MG tablet Take 1/2 to 1  tablet 3 to  4 x / day for chronic pain 360 tablet 3  . Insulin Glargine (BASAGLAR KWIKPEN) 100 UNIT/ML SOPN Inject 0.7 mLs (70 Units total) into the skin at bedtime. NEED APPOINTMENT 75 pen 0  . Insulin Pen Needle (BD PEN NEEDLE NANO U/F) 32G X 4 MM MISC USE 4 TIMES A DAY 300 each 11  . Lancets (ONETOUCH ULTRASOFT) lancets Use as instructed 100 each 12  . losartan (COZAAR) 50 MG tablet TAKE 1 TABLET BY MOUTH EVERY DAY 90 tablet 1  . nitroGLYCERIN (NITROSTAT) 0.4 MG SL tablet Place 1 tablet (0.4 mg total) under the tongue every 5 (five) minutes x 3 doses as needed for chest pain. 25 tablet 4  . NOVOLOG FLEXPEN 100 UNIT/ML FlexPen INJECT 16-20 UNITS INTO THE SKIN 3 (THREE) TIMES DAILY WITH MEALS. 21 pen 6  . Omega-3 Fatty Acids (FISH OIL) 1000 MG CAPS Take 1 capsule (1,000 mg total) by mouth 2 (two) times daily.  0  . ONETOUCH VERIO test strip USE AS INSTRUCTED TO CHECK SUGAR 2 TIMES DAILY. 200 each 4  . rOPINIRole (REQUIP) 3 MG tablet Take 1 tablet (3 mg total) 4 (four) times daily by mouth. 360 tablet 1  . tiZANidine (ZANAFLEX) 4 MG tablet Take 1/2 to 1 tablet 3 x  /day as needed for muscle spasm 90 tablet 1  . ezetimibe (ZETIA) 10 MG tablet Take 1 tablet (10 mg total) by mouth daily. 90 tablet 3  . liraglutide (VICTOZA) 18 MG/3ML SOPN Inject 0.3 mLs (1.8 mg total) into the skin once a week for 12  doses. 6 pen 3   No current facility-administered medications on file prior to visit.     Current Problems (verified) Patient Active Problem List   Diagnosis Date Noted  . DJD (degenerative joint disease), lumbosacral 06/02/2017  . Anxiety 04/19/2016  . CAD S/P percutaneous coronary angioplasty   . History of non-ST elevation myocardial infarction (NSTEMI) 04/14/2016  . CKD (chronic kidney disease), stage III ([place]) 04/14/2016  . Iron deficiency anemia 08/15/2015  . OSA on CPAP 04/16/2015  . RLS (restless legs syndrome) 03/20/2014  . Type 2 IDDM with complication 78/29/5621  . Medication management 09/12/2013  . Morbid obesity 05/14/2013  . Vitamin D deficiency 05/14/2013  . Dyslipidemia 02/24/2009  . Essential hypertension 02/24/2009  . Diverticulosis of large intestine 02/24/2009  . History of colonic polyps 02/24/2009     Allergies Allergies  Allergen Reactions  . Brilinta [Ticagrelor] Shortness Of Breath and Other (See Comments)    Chest pain (also)  . Aspirin Other (See Comments)    Can tolerate in small doses (is already on a blood thinner AND has kidney disease)  . Nsaids Other (See Comments)    Patient is taking a blood thinner and has kidney disease  . Minocycline Hcl Other (See Comments)    Welts  . Oruvail [Ketoprofen] Other (See Comments)    Has kidney disease and is taking a blood thinner  . Tricor [Fenofibrate]     Reaction unknown  . Vasotec [Enalaprilat]     Reaction unknown  . Zinc     Reaction unknown    SURGICAL HISTORY She  has a past surgical history that includes Total knee arthroplasty (Bilateral); Lumbar disc surgery (X 2); Appendectomy; Joint replacement; Back surgery; Melanoma excision (Left); Excision basal cell carcinoma (Left); Shoulder open rotator cuff repair (Right); Shoulder arthroscopy w/ rotator cuff repair (Left); Cataract extraction, bilateral (Bilateral); Cardiac catheterization (N/A, 04/15/2016); and Cardiac catheterization (N/A,  04/15/2016). FAMILY HISTORY  Her family history includes AAA (abdominal aortic aneurysm) in her father; Heart disease in her mother and son; Kidney disease in her father. SOCIAL HISTORY She  reports that she quit smoking about 34 years ago. Her smoking use included cigarettes. She has a 62.50 pack-year smoking history. She has quit using smokeless tobacco. She reports that she does not drink alcohol or use drugs.   Objective:   Blood pressure 110/66, pulse 85, temperature 99.5 F (37.5 C), resp. rate 18, height 5' 2.5" (1.588 m), weight 255 lb 12.8 oz (116 kg), SpO2 95 %. Body mass index is 46.04 kg/m.  General appearance: alert, no distress, WD/WN,  female HEENT: normocephalic, sclerae anicteric, TMs pearly, nares patent, no discharge or erythema, pharynx normal Oral cavity: MMM, no lesions Neck: supple, no lymphadenopathy, no thyromegaly, no masses Heart: RRR, normal S1, S2, no murmurs Lungs: CTA bilaterally, no wheezes, rhonchi, or rales Abdomen: +bs, soft, obese non tender, non distended, no masses, no hepatomegaly, no splenomegaly Musculoskeletal: nontender, no swelling, no obvious deformity Extremities: 1-2+ edema, no cyanosis, no clubbing Pulses: 2+ symmetric, upper and lower extremities, normal cap refill Neurological: alert, oriented x 3, CN2-12 intact, strength normal upper extremities and lower extremities, sensation decreased bilateral legs up to mid shin, DTRs 2+ throughout, no cerebellar signs, gait wide and antalgic Psychiatric: normal affect, behavior normal, pleasant     Janet Mutters, PA-C   01/04/2018

## 2018-01-05 ENCOUNTER — Ambulatory Visit (HOSPITAL_COMMUNITY)
Admission: RE | Admit: 2018-01-05 | Discharge: 2018-01-05 | Disposition: A | Discharge status: Home / Self Care | Payer: PPO | Source: Ambulatory Visit | Attending: Vascular Surgery | Admitting: Vascular Surgery

## 2018-01-05 ENCOUNTER — Ambulatory Visit: Payer: Self-pay | Admitting: Adult Health

## 2018-01-05 ENCOUNTER — Other Ambulatory Visit: Payer: Self-pay | Admitting: Physician Assistant

## 2018-01-05 DIAGNOSIS — I872 Venous insufficiency (chronic) (peripheral): Secondary | ICD-10-CM | POA: Insufficient documentation

## 2018-01-05 DIAGNOSIS — R079 Chest pain, unspecified: Secondary | ICD-10-CM | POA: Insufficient documentation

## 2018-01-05 DIAGNOSIS — R0602 Shortness of breath: Secondary | ICD-10-CM

## 2018-01-05 LAB — HEMOGLOBIN A1C
HEMOGLOBIN A1C: 9.1 %{Hb} — AB (ref <5.7)
Mean Plasma Glucose: 214 (calc)
eAG (mmol/L): 11.9 (calc)

## 2018-01-05 LAB — COMPLETE METABOLIC PANEL WITH GFR
AG Ratio: 1.7 (calc) (ref 1.0–2.5)
ALBUMIN MSPROF: 3.8 g/dL (ref 3.6–5.1)
ALT: 11 U/L (ref 6–29)
AST: 11 U/L (ref 10–35)
Alkaline phosphatase (APISO): 74 U/L (ref 33–130)
BUN / CREAT RATIO: 14 (calc) (ref 6–22)
BUN: 20 mg/dL (ref 7–25)
CALCIUM: 9.4 mg/dL (ref 8.6–10.4)
CO2: 27 mmol/L (ref 20–32)
Chloride: 108 mmol/L (ref 98–110)
Creat: 1.47 mg/dL — ABNORMAL HIGH (ref 0.60–0.93)
GFR, Est African American: 39 mL/min/{1.73_m2} — ABNORMAL LOW (ref >=60)
GFR, Est Non African American: 34 mL/min/{1.73_m2} — ABNORMAL LOW (ref >=60)
GLUCOSE: 150 mg/dL — AB (ref 65–99)
Globulin: 2.2 g/dL (calc) (ref 1.9–3.7)
Potassium: 5 mmol/L (ref 3.5–5.3)
Sodium: 145 mmol/L (ref 135–146)
Total Bilirubin: 0.5 mg/dL (ref 0.2–1.2)
Total Protein: 6 g/dL — ABNORMAL LOW (ref 6.1–8.1)

## 2018-01-05 LAB — CBC WITH DIFFERENTIAL/PLATELET
BASOS PCT: 0 %
Basophils Absolute: 0 cells/uL (ref 0–200)
EOS ABS: 170 {cells}/uL (ref 15–500)
EOS PCT: 4.6 %
HCT: 33.4 % — ABNORMAL LOW (ref 35.0–45.0)
HEMOGLOBIN: 11.2 g/dL — AB (ref 11.7–15.5)
Lymphs Abs: 1121 cells/uL (ref 850–3900)
MCH: 31.5 pg (ref 27.0–33.0)
MCHC: 33.5 g/dL (ref 32.0–36.0)
MCV: 93.8 fL (ref 80.0–100.0)
MONOS PCT: 5.2 %
MPV: 10.2 fL (ref 7.5–12.5)
NEUTROS ABS: 2216 {cells}/uL (ref 1500–7800)
Neutrophils Relative %: 59.9 %
Platelets: 163 10*3/uL (ref 140–400)
RBC: 3.56 10*6/uL — ABNORMAL LOW (ref 3.80–5.10)
RDW: 12.7 % (ref 11.0–15.0)
Total Lymphocyte: 30.3 %
WBC mixed population: 192 cells/uL — ABNORMAL LOW (ref 200–950)
WBC: 3.7 10*3/uL — ABNORMAL LOW (ref 3.8–10.8)

## 2018-01-05 LAB — LIPID PANEL
Cholesterol: 124 mg/dL (ref <200)
HDL: 39 mg/dL — ABNORMAL LOW (ref >50)
LDL CHOLESTEROL (CALC): 58 mg/dL
Non-HDL Cholesterol (Calc): 85 mg/dL (calc) (ref <130)
TRIGLYCERIDES: 207 mg/dL — AB (ref <150)
Total CHOL/HDL Ratio: 3.2 (calc) (ref <5.0)

## 2018-01-05 LAB — MAGNESIUM: MAGNESIUM: 2 mg/dL (ref 1.5–2.5)

## 2018-01-05 LAB — TSH: TSH: 0.89 m[IU]/L (ref 0.40–4.50)

## 2018-01-05 LAB — D-DIMER, QUANTITATIVE: D-Dimer, Quant: 0.73 mcg/mL FEU — ABNORMAL HIGH (ref <0.50)

## 2018-01-06 ENCOUNTER — Other Ambulatory Visit: Payer: Self-pay | Admitting: Internal Medicine

## 2018-01-06 NOTE — Telephone Encounter (Signed)
Okay to send 

## 2018-01-06 NOTE — Telephone Encounter (Signed)
Is this okay to refill? 

## 2018-01-10 ENCOUNTER — Encounter (HOSPITAL_COMMUNITY)
Admission: RE | Admit: 2018-01-10 | Discharge: 2018-01-10 | Disposition: A | Discharge status: Home / Self Care | Payer: PPO | Source: Ambulatory Visit | Attending: Physician Assistant | Admitting: Physician Assistant

## 2018-01-10 ENCOUNTER — Ambulatory Visit (HOSPITAL_COMMUNITY)
Admission: RE | Admit: 2018-01-10 | Discharge: 2018-01-10 | Disposition: A | Discharge status: Home / Self Care | Payer: PPO | Source: Ambulatory Visit | Attending: Physician Assistant | Admitting: Physician Assistant

## 2018-01-10 DIAGNOSIS — R0602 Shortness of breath: Secondary | ICD-10-CM | POA: Insufficient documentation

## 2018-01-10 DIAGNOSIS — J984 Other disorders of lung: Secondary | ICD-10-CM

## 2018-01-10 DIAGNOSIS — I517 Cardiomegaly: Secondary | ICD-10-CM | POA: Insufficient documentation

## 2018-01-10 MED ORDER — TECHNETIUM TC 99M DIETHYLENETRIAME-PENTAACETIC ACID
26.0000 | Freq: Once | INTRAVENOUS | Status: AC | PRN
  Administered 2018-01-10: 26 via RESPIRATORY_TRACT

## 2018-01-10 MED ORDER — TECHNETIUM TO 99M ALBUMIN AGGREGATED
4.2000 | Freq: Once | INTRAVENOUS | Status: AC | PRN
  Administered 2018-01-10: 4.2 via INTRAVENOUS

## 2018-01-17 NOTE — Progress Notes (Signed)
Assessment and Plan:  Shortness of breath Has OV Monday  Type 2 diabetes mellitus with stage 3 chronic kidney disease, with long-term current use of insulin (HCC) Discussed disease progression and risks Discussed diet/exercise, weight management and risk modification  Depression, major, recurrent, in partial remission (HCC) -     buPROPion (WELLBUTRIN XL) 300 MG 24 hr tablet; Take 1 tablet (300 mg total) by mouth every morning. - suggest counseling  Morbid obesity ([place]) -     phentermine (ADIPEX-P) 37.5 MG tablet; Take 1 tablet (37.5 mg total) by mouth daily before breakfast. Discussed risk of MI, HTN, arrhythmia, patient wants 1 month of meds  Bilateral hand pain -     diclofenac sodium (VOLTAREN) 1 % GEL; Apply 4 g topically 4 (four) times daily. -     ANA -     Anti-DNA antibody, double-stranded -     Rheumatoid factor -     RNP Antibody - will refer to ortho  Future Appointments  Date Time Provider [place]  01/23/2018  3:30 PM Tanda Rockers, MD [facility] None  04/12/2018  2:30 PM [name], MD [facility] None  04/19/2018  1:20 PM Skeet Latch, MD [facility]  06/29/2018 10:00 AM Vicie Mutters, PA-C [facility] None  07/04/2018 11:15 AM Vicie Mutters, PA-C [facility] None      HPI 77 y.o. obese WF has Dyslipidemia; Essential hypertension; Diverticulosis of large intestine; History of colonic polyps; Morbid obesity; Vitamin D deficiency; Medication management; RLS (restless legs syndrome); Type 2 IDDM with complication; OSA on CPAP; Iron deficiency anemia; History of non-ST elevation myocardial infarction (NSTEMI); CKD (chronic kidney disease), stage III ([place]); CAD S/P percutaneous coronary angioplasty; Anxiety; and DJD (degenerative joint disease), lumbosacral on their problem list. presents for arthritis pain. She is right handed,she is having right hand pain but has bilateral hand pain, right worse than left. She has swelling and pain.  Last uric acid was 3.9.   She states she hurts all over, and she wants to sleep all day. She has an appointment with pulmonary on Monday.   BMI is Body mass index is 45.9 kg/m., she is working on diet and exercise. Wt Readings from Last 3 Encounters:  01/18/18 255 lb (115.7 kg)  01/04/18 255 lb 12.8 oz (116 kg)  11/04/17 249 lb 3.2 oz (113 kg)   Blood pressure 118/60, pulse 81, temperature 98.6 F (37 C), resp. rate 16, height 5' 2.5" (1.588 m), weight 255 lb (115.7 kg), SpO2 93 %.   Past Medical History:  Diagnosis Date  . Anemia    hx (04/14/2016)  . Anxiety   . Arthritis    "severe in my back; hands; ankles" (04/14/2016)  . Basal cell carcinoma    "several burned off; one cut off"  . CAD in native artery    a. NSTEMI 04/2016 - s/p DES toLAD and LCx. PCI to LCx notable for microembolization during cath.  . Chest pain- reslved with stopping Brilinta now on Plaix 04/16/2016  . Chronic lower back pain   . CKD (chronic kidney disease), stage III ([place])   . Depression   . DJD (degenerative joint disease)   . Family history of adverse reaction to anesthesia    "half-sister used to get real sick" (04/14/2016)  . GERD (gastroesophageal reflux disease)   . History of gout   . Hyperlipidemia   . Hypertension   . IBS (irritable bowel syndrome)   . Ischemic cardiomyopathy    a. 04/2016: EF 40-50%  by cath, 50-55% +WMA by echo.  . 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 ([place]) ~ 2015  . Morbid obesity ([place])   . NSTEMI (non-ST elevated myocardial infarction) ([place]) 04/14/2016  . OSA on CPAP   . Peripheral neuropathy   . Peripheral vascular disease ([place])   . RLS (restless legs syndrome)   . Spinal stenosis   . Type II diabetes mellitus ([place])   . Vitamin D deficiency      Allergies  Allergen Reactions  . Brilinta [Ticagrelor] Shortness Of Breath and Other (See Comments)    Chest pain (also)  . Aspirin Other (See Comments)    Can tolerate in small doses (is already on a blood  thinner AND has kidney disease)  . Nsaids Other (See Comments)    Patient is taking a blood thinner and has kidney disease  . Minocycline Hcl Other (See Comments)    Welts  . Oruvail [Ketoprofen] Other (See Comments)    Has kidney disease and is taking a blood thinner  . Tricor [Fenofibrate]     Reaction unknown  . Vasotec [Enalaprilat]     Reaction unknown  . Zinc     Reaction unknown    Current Outpatient Medications on File Prior to Visit  Medication Sig  . acetaminophen (TYLENOL) 325 MG tablet Take 2 tablets (650 mg total) by mouth every 4 (four) hours as needed for headache or mild pain.  Marland Kitchen allopurinol (ZYLOPRIM) 100 MG tablet Take 1 tablet daily to prevent Gout  . amoxicillin-clavulanate (AUGMENTIN) 250-125 MG tablet Take 1 tablet by mouth 2 (two) times daily. For 7 days. Take with food.  Marland Kitchen aspirin EC 81 MG EC tablet Take 1 tablet (81 mg total) by mouth daily.  Marland Kitchen atorvastatin (LIPITOR) 80 MG tablet TAKE 1 TABLET (80 MG TOTAL) BY MOUTH DAILY AT 6 PM.  . buPROPion (WELLBUTRIN XL) 150 MG 24 hr tablet TAKE 1 TABLET BY MOUTH EVERY DAY  . Cholecalciferol (CVS VIT D 5000 HIGH-POTENCY) 5000 units capsule Take 5,000 Units by mouth 2 (two) times daily.   . colchicine 0.6 MG tablet Take 2 tablets first day of gout flare, then one a day until gout flare is better  . escitalopram (LEXAPRO) 10 MG tablet Take 1 tablet (10 mg total) by mouth daily.  Marland Kitchen ezetimibe (ZETIA) 10 MG tablet Take 1 tablet (10 mg total) by mouth daily.  Marland Kitchen FLECTOR 1.3 % PTCH APPLY 1 PATCH EVERY 12 HOURS AS NEEDED THEN OFF 12 HOURS  . furosemide (LASIX) 40 MG tablet TAKE 1 TABLET 3 TIMES A DAY FOR FLUID RETENTION  . gabapentin (NEURONTIN) 800 MG tablet Take 1/2 to 1  tablet 3 to 4 x / day for chronic pain  . Insulin Glargine (BASAGLAR KWIKPEN) 100 UNIT/ML SOPN INJECT 0.7 MLS (70 UNITS TOTAL) INTO THE SKIN AT BEDTIME. NEED APPOINTMENT  . Insulin Pen Needle (BD PEN NEEDLE NANO U/F) 32G X 4 MM MISC USE 4 TIMES A DAY  . Lancets  (ONETOUCH ULTRASOFT) lancets Use as instructed  . liraglutide (VICTOZA) 18 MG/3ML SOPN Inject 0.3 mLs (1.8 mg total) into the skin once a week for 12 doses.  Marland Kitchen losartan (COZAAR) 50 MG tablet TAKE 1 TABLET BY MOUTH EVERY DAY  . nitroGLYCERIN (NITROSTAT) 0.4 MG SL tablet Place 1 tablet (0.4 mg total) under the tongue every 5 (five) minutes x 3 doses as needed for chest pain.  Marland Kitchen NOVOLOG FLEXPEN 100 UNIT/ML FlexPen INJECT 16-20 UNITS INTO THE SKIN 3 (THREE) TIMES  DAILY WITH MEALS.  Marland Kitchen Omega-3 Fatty Acids (FISH OIL) 1000 MG CAPS Take 1 capsule (1,000 mg total) by mouth 2 (two) times daily.  Glory Rosebush VERIO test strip USE AS INSTRUCTED TO CHECK SUGAR 2 TIMES DAILY.  Marland Kitchen rOPINIRole (REQUIP) 3 MG tablet Take 1 tablet (3 mg total) 4 (four) times daily by mouth.  Marland Kitchen tiZANidine (ZANAFLEX) 4 MG tablet Take 1/2 to 1 tablet 3 x  /day as needed for muscle spasm   No current facility-administered medications on file prior to visit.     ROS: all negative except above.   Physical Exam: There were no vitals filed for this visit. There were no vitals taken for this visit. General appearance: alert, no distress, WD/WN,  female HEENT: normocephalic, sclerae anicteric, TMs pearly, nares patent, no discharge or erythema, pharynx normal Oral cavity: MMM, no lesions Neck: supple, no lymphadenopathy, no thyromegaly, no masses Heart: RRR, normal S1, S2, no murmurs Lungs: CTA bilaterally, no wheezes, rhonchi, or rales Abdomen: +bs, soft, obese non tender, non distended, no masses, no hepatomegaly, no splenomegaly Musculoskeletal: nontender, no swelling, no obvious deformity Extremities: 1-2+ edema, no cyanosis, no clubbing Pulses: 2+ symmetric, upper and lower extremities, normal cap refill Neurological: alert, oriented x 3, CN2-12 intact, strength normal upper extremities and lower extremities, sensation decreased bilateral legs up to mid shin, DTRs 2+ throughout, no cerebellar signs, gait wide and  antalgic Psychiatric: normal affect, behavior normal, pleasant     Vicie Mutters, PA-C 6:01 AM [facility]

## 2018-01-18 ENCOUNTER — Encounter: Payer: Self-pay | Admitting: Physician Assistant

## 2018-01-18 ENCOUNTER — Ambulatory Visit (INDEPENDENT_AMBULATORY_CARE_PROVIDER_SITE_OTHER): Payer: PPO | Admitting: Physician Assistant

## 2018-01-18 VITALS — BP 118/60 | HR 81 | Temp 98.6°F | Resp 16 | Ht 62.5 in | Wt 255.0 lb

## 2018-01-18 DIAGNOSIS — F3341 Major depressive disorder, recurrent, in partial remission: Secondary | ICD-10-CM | POA: Diagnosis not present

## 2018-01-18 DIAGNOSIS — N183 Chronic kidney disease, stage 3 unspecified: Secondary | ICD-10-CM

## 2018-01-18 DIAGNOSIS — Z794 Long term (current) use of insulin: Secondary | ICD-10-CM

## 2018-01-18 DIAGNOSIS — M79641 Pain in right hand: Secondary | ICD-10-CM

## 2018-01-18 DIAGNOSIS — E1122 Type 2 diabetes mellitus with diabetic chronic kidney disease: Secondary | ICD-10-CM | POA: Diagnosis not present

## 2018-01-18 DIAGNOSIS — M79642 Pain in left hand: Secondary | ICD-10-CM | POA: Diagnosis not present

## 2018-01-18 DIAGNOSIS — R0602 Shortness of breath: Secondary | ICD-10-CM | POA: Diagnosis not present

## 2018-01-18 MED ORDER — BUPROPION HCL ER (XL) 300 MG PO TB24: 300.0000 mg | ORAL_TABLET | ORAL | 2 refills | Status: DC

## 2018-01-18 MED ORDER — DICLOFENAC SODIUM 1 % TD GEL: 4.0000 g | Freq: Four times a day (QID) | TRANSDERMAL | 3 refills | Status: DC

## 2018-01-18 MED ORDER — PHENTERMINE HCL 37.5 MG PO TABS: 37.5000 mg | ORAL_TABLET | Freq: Every day | ORAL | 0 refills | Status: DC

## 2018-01-18 NOTE — Patient Instructions (Addendum)
Suggest seeing counselor Will increase wellbutrin to 300mg - take 2 of what you have until it runs out  If your hands are not better will refer to ortho  Counseling services  Here are some numbers below you can try but I suggest calling your insurance and finding out who is in your network and THEN calling those people or looking them up on google.   I'm a big fan of Cognitive Behavioral Therapy, look this up on You tube or check with the therapist you see if they are certified.  This form of therapy helps to teach you skills to better handle with current situation that are causing anxiety or depression.   Phentermine  While taking the medication we may ask that you come into the office once a month for the first month for a blood pressure check and EKG.   Also please bring in a food log for that visit to review. It is helpful if you bring in a food diary or use an app on your phone such as myfitnesspal to record your calorie intake, especially in the beginning. BRING FOR YOUR FIRST VISIT.  After that first initial visit, we will want to see you once every 2-3 months to monitor your weight, blood pressure, and heart rate.   In addition we can help answer your questions about diet, exercise, and help you every step of the way with your weight loss journey.  You can start out on 1/3 to 1/2 a pill in the morning and if you are tolerating it well you can increase to one pill daily. I also have some patients that take 1/3 or 1/2 at lunch to help prevent night time eating.  This medication is cheapest CASH pay at [facility] is 14-17 dollars and you do NOT need a membership to get meds from there.   It causes dry mouth and constipation in almost every patient, so try to get 80-100 oz of water a day and increase fiber such as veggies. You can add on a stool softener if you would like.   It can give you energy however it can also cause some people to be shaky, anxious or have  palpitations. Stop this medication if that happens and contact the office.   If this medication does not work for you there are several medications that we can try to help rewire your brain in addition to making healthier habits.   What is this medicine? PHENTERMINE (FEN ter meen) decreases your appetite. This medicine is intended to be used in addition to a healthy reduced calorie diet and exercise. The best results are achieved this way. This medicine is only indicated for short-term use. Eventually your weight loss may level out and the medication will no longer be needed.   How should I use this medicine? Take this medicine by mouth. Follow the directions on the prescription label. The tablets should stay in the bottle until immediately before you take your dose. Take your doses at regular intervals. Do not take your medicine more often than directed.  Overdosage: If you think you have taken too much of this medicine contact a poison control center or emergency room at once. NOTE: This medicine is only for you. Do not share this medicine with others.  What if I miss a dose? If you miss a dose, take it as soon as you can. If it is almost time for your next dose, take only that dose. Do not  take double or extra doses. Do not increase or in any way change your dose without consulting your doctor.  What should I watch for while using this medicine? Notify your physician immediately if you become short of breath while doing your normal activities. Do not take this medicine within 6 hours of bedtime. It can keep you from getting to sleep. Avoid drinks that contain caffeine and try to stick to a regular bedtime every night. Do not stand or sit up quickly, especially if you are an older patient. This reduces the risk of dizzy or fainting spells. Avoid alcoholic drinks.  What side effects may I notice from receiving this medicine? Side effects that you should report to your doctor or health care  professional as soon as possible: -chest pain, palpitations -depression or severe changes in mood -increased blood pressure -irritability -nervousness or restlessness -severe dizziness -shortness of breath -problems urinating -unusual swelling of the legs -vomiting  Side effects that usually do not require medical attention (report to your doctor or health care professional if they continue or are bothersome): -blurred vision or other eye problems -changes in sexual ability or desire -constipation or diarrhea -difficulty sleeping -dry mouth or unpleasant taste -headache -nausea This list may not describe all possible side effects. Call your doctor for medical advice about side effects. You may report side effects to FDA at 1-800-FDA-1088.

## 2018-01-23 ENCOUNTER — Encounter: Payer: Self-pay | Admitting: Internal Medicine

## 2018-01-23 ENCOUNTER — Ambulatory Visit (INDEPENDENT_AMBULATORY_CARE_PROVIDER_SITE_OTHER): Payer: PPO | Admitting: Internal Medicine

## 2018-01-23 ENCOUNTER — Other Ambulatory Visit (INDEPENDENT_AMBULATORY_CARE_PROVIDER_SITE_OTHER): Payer: PPO

## 2018-01-23 VITALS — BP 112/60 | HR 75 | Ht 61.0 in | Wt 257.0 lb

## 2018-01-23 DIAGNOSIS — R0609 Other forms of dyspnea: Secondary | ICD-10-CM

## 2018-01-23 DIAGNOSIS — R0789 Other chest pain: Secondary | ICD-10-CM | POA: Diagnosis not present

## 2018-01-23 DIAGNOSIS — R05 Cough: Secondary | ICD-10-CM | POA: Diagnosis not present

## 2018-01-23 DIAGNOSIS — R058 Other specified cough: Secondary | ICD-10-CM

## 2018-01-23 LAB — CBC WITH DIFFERENTIAL/PLATELET
BASOS ABS: 0 10*3/uL (ref 0.0–0.1)
Basophils Relative: 0.7 % (ref 0.0–3.0)
EOS ABS: 0.2 10*3/uL (ref 0.0–0.7)
Eosinophils Relative: 4.9 % (ref 0.0–5.0)
HEMATOCRIT: 36.7 % (ref 36.0–46.0)
HEMOGLOBIN: 12 g/dL (ref 12.0–15.0)
LYMPHS PCT: 28 % (ref 12.0–46.0)
Lymphs Abs: 1 10*3/uL (ref 0.7–4.0)
MCHC: 32.7 g/dL (ref 30.0–36.0)
MCV: 96.1 fl (ref 78.0–100.0)
Monocytes Absolute: 0.2 10*3/uL (ref 0.1–1.0)
Monocytes Relative: 5.3 % (ref 3.0–12.0)
Neutro Abs: 2.3 10*3/uL (ref 1.4–7.7)
Neutrophils Relative %: 61.1 % (ref 43.0–77.0)
PLATELETS: 173 10*3/uL (ref 150.0–400.0)
RBC: 3.82 Mil/uL — ABNORMAL LOW (ref 3.87–5.11)
RDW: 14.4 % (ref 11.5–15.5)
WBC: 3.7 10*3/uL — ABNORMAL LOW (ref 4.0–10.5)

## 2018-01-23 LAB — RNP ANTIBODY: Ribonucleic Protein(ENA) Antibody, IgG: <1 AI

## 2018-01-23 LAB — ANTI-DNA ANTIBODY, DOUBLE-STRANDED: ds DNA Ab: <1 IU/mL

## 2018-01-23 LAB — ANA: Anti Nuclear Antibody(ANA): NEGATIVE

## 2018-01-23 LAB — RHEUMATOID FACTOR: Rhuematoid fact SerPl-aCnc: <14 IU/mL (ref <14)

## 2018-01-23 MED ORDER — PANTOPRAZOLE SODIUM 40 MG PO TBEC: 40.0000 mg | DELAYED_RELEASE_TABLET | Freq: Every day | ORAL | 2 refills | Status: DC

## 2018-01-23 MED ORDER — FAMOTIDINE 20 MG PO TABS: ORAL_TABLET | ORAL | 11 refills | Status: DC

## 2018-01-23 NOTE — Progress Notes (Signed)
Janet Choi, female    DOB: March 18, 1941,   MRN: [id number]    Brief patient profile:  82 yowf quit smoking 1985 and breathing fine x for sinus problems with high pollen since 2017 with activity limited by bursititis (prior to that no yard but housework ok and big stores holding on to cart ok at wt 238 in Feb 2019 then started on Insulin in march 2019 and limited by bursitis and gained up to 257 and when tried to walk in soft sand x  10 ft p up and down ramp > sob/ midline cp > sob resolved sitting still, cp some better and resolved with ntg but still present so went to hosp 12/31/17 > neg trop/ stress test neg / neg VQ in [place]  01/10/18     History of Present Illness  01/23/2018  Pulmonary/ 1st office eval  Chief Complaint  Patient presents with  . Pulmonary Consult    Referred by Vicie Mutters, PA.  Pt c/o SOB for the past 3-4 wks. She first noticed it [place].  She also c/o CP with exertion. She was winded walking from lobby to exam room today.   Dyspnea:  50 ft Cough: onset at time of incident/ persists day > noct and dry quality   Sleep: one pillow SABA use:  ? Worse  p saba  Cp has persisted daily x weeks never in supine position, midline s radiation, subxiphoid and no change with eating, deep breath/ cough or assoc n or V and in not really worse with ex "it's just constant"   No obvious day to day or daytime variability or assoc excess/ purulent sputum or mucus plugs or hemoptysis or   chest tightness, subjective wheeze or overt sinus or hb symptoms.   Sleeping fine  without nocturnal  or early am exacerbation  of respiratory  c/o's or need for noct saba. Also denies any obvious fluctuation of symptoms with weather or environmental changes or other aggravating or alleviating factors except as outlined above   No unusual exposure hx or h/o childhood pna/ asthma or knowledge of premature birth.  Current Allergies, Complete Past Medical History, Past  Surgical History, Family History, and Social History were reviewed in [employer] record.  ROS  The following are not active complaints unless bolded Hoarseness, sore throat, dysphagia, dental problems, itching, sneezing,  nasal congestion or discharge of excess mucus or purulent secretions, ear ache,   fever, chills, sweats, unintended wt loss or wt gain, classically pleuritic or exertional cp,  orthopnea pnd or arm/hand swelling  or leg swelling, presyncope, palpitations, abdominal pain, anorexia, nausea, vomiting, diarrhea  or change in bowel habits or change in bladder habits, change in stools or change in urine, dysuria, hematuria,  rash, arthralgias, visual complaints, headache, numbness, weakness or ataxia or problems with walking or coordination,  change in mood or  memory.           Past Medical History:  Diagnosis Date  . Anemia    hx (04/14/2016)  . Anxiety   . Arthritis    "severe in my back; hands; ankles" (04/14/2016)  . Basal cell carcinoma    "several burned off; one cut off"  . CAD in native artery    a. NSTEMI 04/2016 - s/p DES toLAD and LCx. PCI to LCx notable for microembolization during cath.  . Chest pain- reslved with stopping Brilinta now on Plaix 04/16/2016  . Chronic lower  back pain   . CKD (chronic kidney disease), stage III ([place])   . Depression   . DJD (degenerative joint disease)   . Family history of adverse reaction to anesthesia    "half-sister used to get real sick" (04/14/2016)  . GERD (gastroesophageal reflux disease)   . History of gout   . Hyperlipidemia   . Hypertension   . IBS (irritable bowel syndrome)   . Ischemic cardiomyopathy    a. 04/2016: EF 40-50% by cath, 50-55% +WMA by echo.  . 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 ([place]) ~ 2015  . Morbid obesity ([place])   . NSTEMI (non-ST elevated myocardial infarction) ([place]) 04/14/2016  . OSA on CPAP   . Peripheral neuropathy   . Peripheral vascular disease ([place])   . RLS  (restless legs syndrome)   . Spinal stenosis   . Type II diabetes mellitus ([place])   . Vitamin D deficiency     Outpatient Medications Prior to Visit  Medication Sig Dispense Refill  . acetaminophen (TYLENOL) 325 MG tablet Take 2 tablets (650 mg total) by mouth every 4 (four) hours as needed for headache or mild pain.    Marland Kitchen allopurinol (ZYLOPRIM) 100 MG tablet Take 1 tablet daily to prevent Gout 90 tablet 3  . aspirin EC 81 MG EC tablet Take 1 tablet (81 mg total) by mouth daily.    Marland Kitchen atorvastatin (LIPITOR) 80 MG tablet TAKE 1 TABLET (80 MG TOTAL) BY MOUTH DAILY AT 6 PM. 90 tablet 0  . buPROPion (WELLBUTRIN XL) 300 MG 24 hr tablet Take 1 tablet (300 mg total) by mouth every morning. 30 tablet 2  . Cholecalciferol ([employer] VIT D 5000 HIGH-POTENCY) 5000 units capsule Take 5,000 Units by mouth 2 (two) times daily.     . colchicine 0.6 MG tablet Take 2 tablets first day of gout flare, then one a day until gout flare is better 90 tablet 1  . diclofenac sodium (VOLTAREN) 1 % GEL Apply 4 g topically 4 (four) times daily. 100 g 3  . escitalopram (LEXAPRO) 10 MG tablet Take 1 tablet (10 mg total) by mouth daily. 90 tablet 3  . ezetimibe (ZETIA) 10 MG tablet Take 1 tablet (10 mg total) by mouth daily. 90 tablet 3  . FLECTOR 1.3 % PTCH APPLY 1 PATCH EVERY 12 HOURS AS NEEDED THEN OFF 12 HOURS 30 patch 0  . furosemide (LASIX) 40 MG tablet TAKE 1 TABLET 3 TIMES A DAY FOR FLUID RETENTION 270 tablet 1  . gabapentin (NEURONTIN) 800 MG tablet Take 1/2 to 1  tablet 3 to 4 x / day for chronic pain 360 tablet 3  . Insulin Glargine (BASAGLAR KWIKPEN) 100 UNIT/ML SOPN INJECT 0.7 MLS (70 UNITS TOTAL) INTO THE SKIN AT BEDTIME. NEED APPOINTMENT 21 pen 0  . Insulin Pen Needle (BD PEN NEEDLE NANO U/F) 32G X 4 MM MISC USE 4 TIMES A DAY 300 each 11  . Lancets (ONETOUCH ULTRASOFT) lancets Use as instructed 100 each 12  . liraglutide (VICTOZA) 18 MG/3ML SOPN Inject 0.3 mLs (1.8 mg total) into the skin once a week for 12 doses. 6  pen 3  . losartan (COZAAR) 50 MG tablet TAKE 1 TABLET BY MOUTH EVERY DAY 90 tablet 1  . nitroGLYCERIN (NITROSTAT) 0.4 MG SL tablet Place 1 tablet (0.4 mg total) under the tongue every 5 (five) minutes x 3 doses as needed for chest pain. 25 tablet 4  . NOVOLOG FLEXPEN 100 UNIT/ML FlexPen INJECT 16-20 UNITS INTO  THE SKIN 3 (THREE) TIMES DAILY WITH MEALS. 21 pen 6  . ONETOUCH VERIO test strip USE AS INSTRUCTED TO CHECK SUGAR 2 TIMES DAILY. 200 each 4  . rOPINIRole (REQUIP) 3 MG tablet Take 1 tablet (3 mg total) 4 (four) times daily by mouth. 360 tablet 1  . tiZANidine (ZANAFLEX) 4 MG tablet Take 1/2 to 1 tablet 3 x  /day as needed for muscle spasm 90 tablet 1  . phentermine (ADIPEX-P) 37.5 MG tablet Take 1 tablet (37.5 mg total) by mouth daily before breakfast. (Patient not taking: Reported on 01/23/2018) 30 tablet 0  . Omega-3 Fatty Acids (FISH OIL) 1000 MG CAPS Take 1 capsule (1,000 mg total) by mouth 2 (two) times daily. (Patient not taking: Reported on 01/23/2018)  0   No facility-administered medications prior to visit.             Objective:     BP 112/60 (BP Location: Left Arm, Cuff Size: Normal)   Pulse 75   Ht 5\' 1"  (1.549 m)   Wt 257 lb (116.6 kg)   SpO2 90%   BMI 48.56 kg/m   SpO2: 90 %  RA  Wt Readings from Last 3 Encounters:  01/23/18 257 lb (116.6 kg)  01/18/18 255 lb (115.7 kg)  01/04/18 255 lb 12.8 oz (116 kg)     HEENT: nl dentition, turbinates bilaterally, and oropharynx. Nl external ear canals without cough reflex   NECK :  without JVD/Nodes/TM/ nl carotid upstrokes bilaterally   LUNGS: no acc muscle use,  Nl contour chest which is clear to A and P bilaterally without cough on insp or exp maneuvers   CV:  RRR  no s3 or murmur or increase in P2, and 1+ pitting both lower ext  ABD:  Massively obese/ soft and nontender with nl inspiratory excursion in the supine position. No bruits or organomegaly appreciated, bowel sounds nl  MS:  Nl gait/ ext warm  without deformities, calf tenderness, cyanosis or clubbing No obvious joint restrictions   SKIN: warm and dry without lesions    NEURO:  alert, approp, nl sensorium with  no motor or cerebellar deficits apparent.      I personally reviewed images and agree with radiology impression as follows:  CXR:   01/10/18  1. Borderline cardiomegaly without failure. 2. Mild bibasilar airspace disease likely reflects atelectasis without focal airspace consolidation.  V/q :  Low prob same date   Labs ordered 01/23/2018  Allergy profile     Assessment   No problem-specific Assessment & Plan notes found for this encounter.     Christinia Gully, MD 01/23/2018

## 2018-01-23 NOTE — Patient Instructions (Addendum)
Pantoprazole (protonix) 40 mg   Take  30-60 min before first meal of the day and Pepcid (famotidine)  20 mg one after supper or at bedtime  until return to office - this is the best way to tell whether stomach acid is contributing to your problem.    Gas pain tends to be daytime, not usually exacerbated by exercise  or coughing, worse in sitting position, frequently associated with generalized abd bloating, not as likely to be present supine due to the dome effect of the diaphragm which  is  canceled in that position. Frequently these patients have had multiple negative GI workups and CT scans.  Treatment consists of avoiding foods that cause gas (especially boiled eggs, mexcican food but especially  beans and undercooked vegetables like  spinach and some salads)  and citrucel 1 heaping tsp twice daily with a large glass of water.  Pain should improve w/in 2 weeks and if not then consider further GI work up.     Weight control is  a matter of calorie balance which needs to be tilted in your favor by eating less and exercising more.  To get the most out of exercise, you need to be continuously aware that you are short of breath, but never out of breath, for 30 minutes daily. As you improve, it will actually be easier for you to do the same amount of exercise  in  30 minutes so always push to the level where you are short of breath    Please schedule a follow up office visit in 4 weeks, sooner if needed

## 2018-01-24 ENCOUNTER — Encounter: Payer: Self-pay | Admitting: Internal Medicine

## 2018-01-24 DIAGNOSIS — R058 Other specified cough: Secondary | ICD-10-CM | POA: Insufficient documentation

## 2018-01-24 DIAGNOSIS — R0789 Other chest pain: Secondary | ICD-10-CM | POA: Insufficient documentation

## 2018-01-24 DIAGNOSIS — R05 Cough: Secondary | ICD-10-CM | POA: Insufficient documentation

## 2018-01-24 HISTORY — DX: Other chest pain: R07.89

## 2018-01-24 LAB — RESPIRATORY ALLERGY PROFILE REGION II ~~LOC~~
Allergen, A. alternata, m6: <0.1 kU/L
Allergen, Comm Silver Birch, t9: <0.1 kU/L
Allergen, Cottonwood, t14: <0.1 kU/L
Allergen, Oak,t7: <0.1 kU/L
Allergen, P. notatum, m1: <0.1 kU/L
Aspergillus fumigatus, m3: <0.1 kU/L
Bermuda Grass: <0.1 kU/L
CLADOSPORIUM HERBARUM (M2) IGE: <0.1 kU/L
CLASS: 0
CLASS: 0
CLASS: 0
CLASS: 0
CLASS: 0
CLASS: 0
CLASS: 0
CLASS: 0
CLASS: 0
COMMON RAGWEED (SHORT) (W1) IGE: <0.1 kU/L
Cat Dander: <0.1 kU/L
Class: 0
Class: 0
Class: 0
Class: 0
Class: 0
Class: 0
Class: 0
Class: 0
Class: 0
Class: 0
Class: 0
Class: 0
Class: 0
Class: 0
Class: 0
Dog Dander: <0.1 kU/L
IGE (IMMUNOGLOBULIN E), SERUM: 39 kU/L (ref <=114)
Johnson Grass: <0.1 kU/L
Pecan/Hickory Tree IgE: <0.1 kU/L
Rough Pigweed  IgE: <0.1 kU/L

## 2018-01-24 LAB — INTERPRETATION:

## 2018-01-24 NOTE — Assessment & Plan Note (Signed)
Body mass index is 48.56 kg/m.  -  trending up since on insulin Lab Results  Component Value Date   TSH 0.89 01/04/2018     Contributing to gerd risk/ doe/reviewed the need and the process to achieve and maintain neg calorie balance > defer f/u primary care including intermittently monitoring thyroid status     Total time devoted to counseling  > 50 % of initial 60 min office visit:  review case with pt/ discussion of options/alternatives/ personally creating written customized instructions  in presence of pt  then going over those specific  Instructions directly with the pt including how to use all of the meds but in particular covering each new medication in detail and the difference between the maintenance= "automatic" meds and the prns using an action plan format for the latter (If this problem/symptom => do that organization reading Left to right).  Please see AVS from this visit for a full list of these instructions which I personally wrote for this pt and  are unique to this visit.

## 2018-01-24 NOTE — Assessment & Plan Note (Addendum)
01/23/2018   Walked RA x one lap @ 185 stopped due to  Sob with sats 89% - Spirometry 01/23/2018  FEV1 1.5 (83%)  Ratio 78 p nothing prior and no curvature    Symptoms are  disproportionate to objective findings and not clear to what extent this is actually a pulmonary  problem but pt does appear to have difficult to sort out respiratory symptoms of unknown origin for which  DDX  = almost all start with A and  include Adherence, Ace Inhibitors, Acid Reflux, Active Sinus Disease, Alpha 1 Antitripsin deficiency, Anxiety masquerading as Airways dz,  ABPA,  Allergy(esp in young), Aspiration (esp in elderly), Adverse effects of meds,  Active smokers, A bunch of PE's/clot burden (a few small clots can't cause this syndrome unless there is already severe underlying pulm or vascular dz with poor reserve),  Anemia or thyroid disorder, plus two Bs  = Bronchiectasis and Beta blocker use..and one C= CHF    Adherence is always the initial "prime suspect" and is a multilayered concern that requires a "trust but verify" approach in every patient - starting with knowing how to use medications, especially inhalers, correctly, keeping up with refills and understanding the fundamental difference between maintenance and prns vs those medications only taken for a very short course and then stopped and not refilled.  - advised to return with all meds in hand using a trust but verify approach to confirm accurate Medication  Reconciliation The principal here is that until we are certain that the  patients are doing what we've asked, it makes no sense to ask them to do more.   ? Acid (or non-acid) GERD > assoc with midline cp and  always difficult to exclude as up to 75% of pts in some series report no assoc GI/ Heartburn symptoms> rec max (24h)  acid suppression and diet restrictions/ reviewed and instructions given in writing  ? Allergy/ asthma > seems unlikely but send allergy profile to be complete   ? Adverse effects of  meds > noe of the usual suspects listed though note listed as allergic to acei and maint on losartan. For reasons that may related to vascular permability and nitric oxide pathways but not elevated  bradykinin levels (as seen with  ACEi use) losartan in the generic form has been reported now from mulitple sources  to cause a similar pattern of non-specific  upper airway symptoms as seen with acei.   This has not been reported with exposure to the other ARB's to date, so I may need to consider:   try either generic diovan or avapro if ARB needed or use an alternative class altogether.  See:  Lelon Frohlich Allergy Asthma Immunol  2008: 101: p [phone]     ?  A bunch of PE's > would not cause midline cp/ v/q low prob  ? CHF > neg cards w/u 12/31/17     ? Anxiety/deconditioning dx of exclusion but since has no pe or cardiac problem identified suggested she start regular sub max ex / pref water aerobics to see if improves by f/u ov in 4 weeks   ?

## 2018-01-24 NOTE — Assessment & Plan Note (Signed)
Midline pain absent supine "constant" daytime since 12/31/17   Described as ntg resp but neg cards w/u so likely GI - esophageal spasm from gerd or ibs  rec  1) gerd rx 2)  Treatment consists of avoiding foods that cause gas (especially boiled eggs, mexcican food but especially  beans and undercooked vegetables like  spinach and some salads)  and citrucel 1 heaping tsp twice daily with a large glass of water.  Pain should improve w/in 2 weeks and if not then consider further GI work up.

## 2018-01-24 NOTE — Assessment & Plan Note (Signed)
Onset 12/31/17 assoc with midline cp  - rec max rx for gerd 01/23/2018    Upper airway cough syndrome (previously labeled PNDS),  is so named because it's frequently impossible to sort out how much is  CR/sinusitis with freq throat clearing (which can be related to primary GERD)   vs  causing  secondary (" extra esophageal")  GERD from wide swings in gastric pressure that occur with throat clearing, often  promoting self use of mint and menthol lozenges that reduce the lower esophageal sphincter tone and exacerbate the problem further in a cyclical fashion.   These are the same pts (now being labeled as having "irritable larynx syndrome" by some cough centers) who not infrequently have a history of having failed to tolerate ace inhibitors,  dry powder inhalers or biphosphonates or report having atypical/extraesophageal reflux symptoms that don't respond to standard doses of PPI  and are easily confused as having aecopd or asthma flares by even experienced allergists/ pulmonologists (myself included).    Of the three most common causes of  Sub-acute / recurrent or chronic cough, only one (GERD)  can actually contribute to/ trigger  the other two (asthma and post nasal drip syndrome)  and perpetuate the cylce of cough.  While not intuitively obvious, many patients with chronic low grade reflux do not cough until there is a primary insult that disturbs the protective epithelial barrier and exposes sensitive nerve endings.   This is typically viral but can due to PNDS and  either may apply here.   The point is that once this occurs, it is difficult to eliminate the cycle  using anything but a maximally effective acid suppression regimen at least in the short run, accompanied by an appropriate diet to address non acid GERD.

## 2018-01-25 NOTE — Progress Notes (Signed)
Left detailed msg ok per DPR

## 2018-02-02 ENCOUNTER — Other Ambulatory Visit: Payer: Self-pay | Admitting: Internal Medicine

## 2018-02-20 ENCOUNTER — Ambulatory Visit (INDEPENDENT_AMBULATORY_CARE_PROVIDER_SITE_OTHER): Payer: PPO | Admitting: Internal Medicine

## 2018-02-20 ENCOUNTER — Encounter: Payer: Self-pay | Admitting: Internal Medicine

## 2018-02-20 VITALS — BP 124/78 | HR 83 | Ht 61.0 in | Wt 248.4 lb

## 2018-02-20 DIAGNOSIS — R058 Other specified cough: Secondary | ICD-10-CM

## 2018-02-20 DIAGNOSIS — R0609 Other forms of dyspnea: Secondary | ICD-10-CM | POA: Diagnosis not present

## 2018-02-20 DIAGNOSIS — R0789 Other chest pain: Secondary | ICD-10-CM

## 2018-02-20 DIAGNOSIS — R05 Cough: Secondary | ICD-10-CM

## 2018-02-20 NOTE — Progress Notes (Signed)
Janet Choi, female    DOB: 10/19/1940,   MRN: [id number]    Brief patient profile:  22 yowf quit smoking 1985 and breathing fine x for sinus problems with high pollen since 2017 with activity limited by bursititis (prior to that no yard but housework ok and big stores holding on to cart ok at wt 238 in Feb 2019 then started on Insulin in march 2019 and limited by bursitis and gained up to 257 and when tried to walk in soft sand x  10 ft p up and down ramp > sob/ midline cp > sob resolved sitting still, cp some better and resolved with ntg but still present so went to hosp 12/31/17 > neg trop/ stress test neg / neg VQ in [place]  01/10/18     History of Present Illness  01/23/2018  Pulmonary/ 1st office eval  Chief Complaint  Patient presents with  . Pulmonary Consult    Referred by Vicie Mutters, PA.  Pt c/o SOB for the past 3-4 wks. She first noticed it while [place] the [place] in [place].  She also c/o CP with exertion. She was winded walking from lobby to exam room today.   Dyspnea:  50 ft Cough: onset at time of incident/ persists day > noct and dry quality   Sleep: one pillow SABA use:  ? Worse  p saba  Cp has persisted daily x weeks never in supine position, midline s radiation, subxiphoid and no change with eating, deep breath/ cough or assoc n or V and in not really worse with ex "it's just constant" rec Pantoprazole (protonix) 40 mg   Take  30-60 min before first meal of the day and Pepcid (famotidine)  20 mg one after supper or at bedtime  until return to office - this is the best way to tell whether stomach acid is contributing to your problem.   Gas pain tends to be daytime, not usually exacerbated by exercise  or coughing, worse in sitting position, frequently associated with generalized abd bloating, not as likely to be present supine due to the dome effect of the diaphragm which  is  canceled in that position. Frequently these patients have had multiple negative GI workups and CT  scans. Treatment consists of avoiding foods that cause gas (especially boiled eggs, mexcican food but especially  beans and undercooked vegetables like  spinach and some salads)  and citrucel 1 heaping tsp twice daily with a large glass of water.  Pain should improve w/in 2 weeks and if not then consider further GI work up.    Weight control is  a matter of calorie balance      02/20/2018  f/u ov/Wert re: cp gone, sob improved  Chief Complaint  Patient presents with  . Follow-up    Breathing is doing much better. She denies any new co's.  Dyspnea:  More limited by L leg than doe  Cough: gone Sleeping: on side/ bed flat/ one pillow none  SABA use: none now / has used cpap in past> declined referral to sleep medicine  02: none     No obvious day to day or daytime variability or assoc excess/ purulent sputum or mucus plugs or hemoptysis or cp or chest tightness, subjective wheeze or overt sinus or hb symptoms.   Sleeps as above  without nocturnal  or early am exacerbation  of respiratory  c/o's or need for noct saba. Also denies any obvious fluctuation of symptoms with  weather or environmental changes or other aggravating or alleviating factors except as outlined above   No unusual exposure hx or h/o childhood pna/ asthma or knowledge of premature birth.  Current Allergies, Complete Past Medical History, Past Surgical History, Family History, and Social History were reviewed in [employer] [facility] record.  ROS  The following are not active complaints unless bolded Hoarseness, sore throat, dysphagia, dental problems, itching, sneezing,  nasal congestion or discharge of excess mucus or purulent secretions, ear ache,   fever, chills, sweats, unintended wt loss or wt gain, classically pleuritic or exertional cp,  orthopnea pnd or arm/hand swelling  or leg swelling, presyncope, palpitations, abdominal pain, anorexia, nausea, vomiting, diarrhea  or change in bowel habits or change  in bladder habits, change in stools or change in urine, dysuria, hematuria,  rash, arthralgias, visual complaints, headache, numbness, weakness or ataxia or problems with walking or coordination,  change in mood or  memory.        Current Meds  Medication Sig  . acetaminophen (TYLENOL) 325 MG tablet Take 2 tablets (650 mg total) by mouth every 4 (four) hours as needed for headache or mild pain.  Marland Kitchen allopurinol (ZYLOPRIM) 100 MG tablet TAKE 1 TABLET DAILY TO PREVENT GOUT  . aspirin EC 81 MG EC tablet Take 1 tablet (81 mg total) by mouth daily.  Marland Kitchen atorvastatin (LIPITOR) 80 MG tablet TAKE 1 TABLET (80 MG TOTAL) BY MOUTH DAILY AT 6 PM.  . buPROPion (WELLBUTRIN XL) 300 MG 24 hr tablet Take 1 tablet (300 mg total) by mouth every morning.  . Cholecalciferol (CVS VIT D 5000 HIGH-POTENCY) 5000 units capsule Take 5,000 Units by mouth 2 (two) times daily.   . diclofenac sodium (VOLTAREN) 1 % GEL Apply 4 g topically 4 (four) times daily.  Marland Kitchen escitalopram (LEXAPRO) 10 MG tablet Take 1 tablet (10 mg total) by mouth daily.  Marland Kitchen ezetimibe (ZETIA) 10 MG tablet Take 1 tablet (10 mg total) by mouth daily.  . famotidine (PEPCID) 20 MG tablet One at bedtime  . FLECTOR 1.3 % PTCH APPLY 1 PATCH EVERY 12 HOURS AS NEEDED THEN OFF 12 HOURS  . furosemide (LASIX) 40 MG tablet TAKE 1 TABLET 3 TIMES A DAY FOR FLUID RETENTION  . gabapentin (NEURONTIN) 800 MG tablet Take 1/2 to 1  tablet 3 to 4 x / day for chronic pain  . Insulin Glargine (BASAGLAR KWIKPEN) 100 UNIT/ML SOPN INJECT 0.7 MLS (70 UNITS TOTAL) INTO THE SKIN AT BEDTIME. NEED APPOINTMENT  . Insulin Pen Needle (BD PEN NEEDLE NANO U/F) 32G X 4 MM MISC USE 4 TIMES A DAY  . Lancets (ONETOUCH ULTRASOFT) lancets Use as instructed  . losartan (COZAAR) 50 MG tablet TAKE 1 TABLET BY MOUTH EVERY DAY  . nitroGLYCERIN (NITROSTAT) 0.4 MG SL tablet Place 1 tablet (0.4 mg total) under the tongue every 5 (five) minutes x 3 doses as needed for chest pain.  Marland Kitchen NOVOLOG FLEXPEN 100 UNIT/ML  FlexPen INJECT 16-20 UNITS INTO THE SKIN 3 (THREE) TIMES DAILY WITH MEALS.  Marland Kitchen ONETOUCH VERIO test strip USE AS INSTRUCTED TO CHECK SUGAR 2 TIMES DAILY.  . pantoprazole (PROTONIX) 40 MG tablet Take 1 tablet (40 mg total) by mouth daily. Take 30-60 min before first meal of the day  . rOPINIRole (REQUIP) 3 MG tablet Take 1 tablet (3 mg total) 4 (four) times daily by mouth.  Marland Kitchen tiZANidine (ZANAFLEX) 4 MG tablet Take 1/2 to 1 tablet 3 x  /day as needed for muscle  spasm               Objective:     amb obese wf nad    Vital signs reviewed - Note on arrival 02 sats  92% on  RA     02/20/2018     248   01/23/18 257 lb (116.6 kg)  01/18/18 255 lb (115.7 kg)  01/04/18 255 lb 12.8 oz (116 kg)       HEENT: nl dentition, turbinates bilaterally, and oropharynx. Nl external ear canals without cough reflex   NECK :  without JVD/Nodes/TM/ nl carotid upstrokes bilaterally   LUNGS: no acc muscle use,  Nl contour chest which is clear to A and P bilaterally without cough on insp or exp maneuvers   CV:  RRR  no s3 or murmur or increase in P2, and trace  edema   ABD:  Markedly obese/  nontender with nl inspiratory excursion in the supine position. No bruits or organomegaly appreciated, bowel sounds nl  MS:  slow gait/ ext warm without deformities, calf tenderness, cyanosis or clubbing   SKIN: warm and dry without lesions    NEURO:  alert, approp, nl sensorium with  no motor or cerebellar deficits apparent.          Assessment

## 2018-02-20 NOTE — Patient Instructions (Addendum)
Check your 02 levels at the end of activity and call me if trending down below 90%   Incentive spirometry as much as you can   If you are satisfied with your treatment plan,  let your doctor know and he/she can either refill your medications or you can return here when your prescription runs out.     If in any way you are not 100% satisfied,  please tell us.  If 100% better, tell your friends!  Pulmonary follow up is as needed

## 2018-02-21 ENCOUNTER — Encounter: Payer: Self-pay | Admitting: Internal Medicine

## 2018-02-21 NOTE — Assessment & Plan Note (Signed)
H/o acei intol Onset 12/31/17 assoc with midline cp  - rec max rx for gerd 01/23/2018  - Allergy profile 01/23/18  >  Eos 0.2 /  IgE  39 RAST neg   Reviewed labs/ recs for 3 months of gerd rx then f/u with pcp and/ or GI

## 2018-02-21 NOTE — Assessment & Plan Note (Signed)
Midline pain absent supine "constant" daytime since 12/31/17 > resolved as of 02/20/2018 on gerd/ gas diet   Advised no change in meds x 3 months and then ok to try off and if flares > renew per PCP or GI eval at her / PCP discretions  No further f/u here for this problem

## 2018-02-21 NOTE — Assessment & Plan Note (Signed)
Body mass index is 46.93 kg/m.  -  trending down/ encouraged Lab Results  Component Value Date   TSH 0.89 01/04/2018     Contributing to gerd risk/ doe/reviewed the need and the process to achieve and maintain neg calorie balance > defer f/u primary care including intermittently monitoring thyroid status     I had an extended final summary discussion with the patient reviewing all relevant studies completed to date and  lasting 15 to 20 minutes of a 25 minute visit    Each maintenance medication was reviewed in detail including most importantly the difference between maintenance and prns and under what circumstances the prns are to be triggered using an action plan format that is not reflected in the computer generated alphabetically organized AVS.     Please see AVS for specific instructions unique to this visit that I personally wrote and verbalized to the the pt in detail and then reviewed with pt  by my nurse highlighting any  changes in therapy recommended at today's visit to their plan of care.

## 2018-02-21 NOTE — Assessment & Plan Note (Addendum)
01/23/2018   Walked RA x one lap @ 185 stopped due to  Sob with sats 89% - Spirometry 01/23/2018  FEV1 1.5 (83%)  Ratio 78 p nothing prior and no curvature   Main limits now are related to back pain /obesity but rec she monitor her sats serially with activity and report back right away if trend is downward as should improved with wt loss if related to poor ventilation of basilar airspaces related to obesity.  IS should help as well and note she already has one at home not using so encouraged to do so.  She is satisfied she has improved and willing to monitor sats as above and return prn

## 2018-03-15 ENCOUNTER — Other Ambulatory Visit: Payer: Self-pay | Admitting: Cardiovascular Disease

## 2018-03-16 NOTE — Telephone Encounter (Signed)
Rx request sent to pharmacy.  

## 2018-03-23 ENCOUNTER — Encounter: Payer: Self-pay | Admitting: Internal Medicine

## 2018-03-29 ENCOUNTER — Telehealth: Payer: Self-pay | Admitting: Internal Medicine

## 2018-03-29 ENCOUNTER — Other Ambulatory Visit: Payer: Self-pay | Admitting: Physician Assistant

## 2018-03-29 NOTE — Telephone Encounter (Signed)
Pt is in need of an appt to follow up on some high risk care gaps, LMTCB to schedule this appt

## 2018-04-05 ENCOUNTER — Ambulatory Visit (INDEPENDENT_AMBULATORY_CARE_PROVIDER_SITE_OTHER): Payer: PPO | Admitting: Physician Assistant

## 2018-04-05 ENCOUNTER — Encounter: Payer: Self-pay | Admitting: Physician Assistant

## 2018-04-05 VITALS — BP 128/64 | HR 94 | Temp 99.0°F | Ht 61.0 in | Wt 244.8 lb

## 2018-04-05 DIAGNOSIS — R39198 Other difficulties with micturition: Secondary | ICD-10-CM

## 2018-04-05 DIAGNOSIS — R1032 Left lower quadrant pain: Secondary | ICD-10-CM | POA: Diagnosis not present

## 2018-04-05 MED ORDER — METRONIDAZOLE 500 MG PO TABS: 500.0000 mg | ORAL_TABLET | Freq: Three times a day (TID) | ORAL | 0 refills | Status: DC

## 2018-04-05 MED ORDER — CIPROFLOXACIN HCL 500 MG PO TABS: 500.0000 mg | ORAL_TABLET | Freq: Two times a day (BID) | ORAL | 0 refills | Status: AC

## 2018-04-05 NOTE — Progress Notes (Signed)
Subjective:    Patient ID: Janet Choi, female    DOB: [birth year], 77 y.o.   MRN: [id number]  HPI 76 y.o. obese, former smoker (62.5 pack year history), history of kidney stones and diverticulitis WF presents with blood in urine.    She states she has been sick about 2 weeks ago with fever/vomiting and diarrhea, thought it was the flu, this is better but the diarrhea, decreased appetite but she is drinking/eating. Then yesterday she wiped and saw on TP after urination. Since then she has had BM with possible blood. This AM 5, woke up with pain LLQ, 5/10, felt urge to have BM but could not. She denies urgency, frequency,   She is going to the beach to visit her sister.    Lab Results  Component Value Date   GFRNONAA 34 (L) 01/04/2018    Lab Results  Component Value Date   CREATININE 1.47 (H) 01/04/2018   BUN 20 01/04/2018   NA 145 01/04/2018   K 5.0 01/04/2018   CL 108 01/04/2018   CO2 27 01/04/2018   BMI is Body mass index is 46.25 kg/m., she is working on diet and exercise. Wt Readings from Last 3 Encounters:  04/05/18 244 lb 12.8 oz (111 kg)  02/20/18 248 lb 6.4 oz (112.7 kg)  01/23/18 257 lb (116.6 kg)     Blood pressure 128/64, pulse 94, temperature 99 F (37.2 C), height 5\' 1"  (1.549 m), weight 244 lb 12.8 oz (111 kg), SpO2 98 %.  Medications Current Outpatient Medications on File Prior to Visit  Medication Sig  . acetaminophen (TYLENOL) 325 MG tablet Take 2 tablets (650 mg total) by mouth every 4 (four) hours as needed for headache or mild pain.  Marland Kitchen allopurinol (ZYLOPRIM) 100 MG tablet TAKE 1 TABLET DAILY TO PREVENT GOUT  . aspirin EC 81 MG EC tablet Take 1 tablet (81 mg total) by mouth daily.  Marland Kitchen atorvastatin (LIPITOR) 80 MG tablet Take 1 tablet (80 mg total) by mouth daily at 6 PM.  . buPROPion (WELLBUTRIN XL) 300 MG 24 hr tablet Take 1 tablet (300 mg total) by mouth every morning.  . Cholecalciferol (CVS VIT D 5000 HIGH-POTENCY) 5000 units capsule Take 5,000  Units by mouth 2 (two) times daily.   . diclofenac sodium (VOLTAREN) 1 % GEL Apply 4 g topically 4 (four) times daily.  Marland Kitchen escitalopram (LEXAPRO) 10 MG tablet Take 1 tablet (10 mg total) by mouth daily.  . famotidine (PEPCID) 20 MG tablet One at bedtime  . FLECTOR 1.3 % PTCH APPLY 1 PATCH EVERY 12 HOURS AS NEEDED THEN OFF 12 HOURS  . furosemide (LASIX) 40 MG tablet TAKE 1 TABLET 3 TIMES A DAY FOR FLUID RETENTION  . gabapentin (NEURONTIN) 800 MG tablet Take 1/2 to 1  tablet 3 to 4 x / day for chronic pain  . Insulin Glargine (BASAGLAR KWIKPEN) 100 UNIT/ML SOPN INJECT 0.7 MLS (70 UNITS TOTAL) INTO THE SKIN AT BEDTIME. NEED APPOINTMENT  . Insulin Pen Needle (BD PEN NEEDLE NANO U/F) 32G X 4 MM MISC USE 4 TIMES A DAY  . Lancets (ONETOUCH ULTRASOFT) lancets Use as instructed  . losartan (COZAAR) 50 MG tablet TAKE 1 TABLET BY MOUTH EVERY DAY  . nitroGLYCERIN (NITROSTAT) 0.4 MG SL tablet Place 1 tablet (0.4 mg total) under the tongue every 5 (five) minutes x 3 doses as needed for chest pain.  Marland Kitchen NOVOLOG FLEXPEN 100 UNIT/ML FlexPen INJECT 16-20 UNITS INTO THE SKIN 3 (THREE) TIMES  DAILY WITH MEALS.  Marland Kitchen ONETOUCH VERIO test strip USE AS INSTRUCTED TO CHECK SUGAR 2 TIMES DAILY.  . pantoprazole (PROTONIX) 40 MG tablet Take 1 tablet (40 mg total) by mouth daily. Take 30-60 min before first meal of the day  . rOPINIRole (REQUIP) 3 MG tablet TAKE 1 TABLET BY MOUTH 4 TIMES A DAY  . tiZANidine (ZANAFLEX) 4 MG tablet Take 1/2 to 1 tablet 3 x  /day as needed for muscle spasm  . colchicine 0.6 MG tablet Take 2 tablets first day of gout flare, then one a day until gout flare is better  . ezetimibe (ZETIA) 10 MG tablet Take 1 tablet (10 mg total) by mouth daily.  Marland Kitchen liraglutide (VICTOZA) 18 MG/3ML SOPN Inject 0.3 mLs (1.8 mg total) into the skin once a week for 12 doses.   No current facility-administered medications on file prior to visit.     Problem list She has Dyslipidemia; Essential hypertension; Diverticulosis  of large intestine; History of colonic polyps; Morbid obesity due to excess calories ([place]); Vitamin D deficiency; Medication management; RLS (restless legs syndrome); Type 2 IDDM with complication; OSA on CPAP; Iron deficiency anemia; History of non-ST elevation myocardial infarction (NSTEMI); CKD (chronic kidney disease), stage III ([place]); CAD S/P percutaneous coronary angioplasty; DOE (dyspnea on exertion); Anxiety; DJD (degenerative joint disease), lumbosacral; Upper airway cough syndrome; and Atypical chest pain on their problem list.  Review of Systems  Constitutional: Positive for fatigue. Negative for chills and fever.  HENT: Negative.   Respiratory: Negative.   Cardiovascular: Negative.   Gastrointestinal: Positive for abdominal pain and diarrhea.  Genitourinary: Positive for flank pain and hematuria. Negative for decreased urine volume, difficulty urinating, dysuria, urgency and vaginal pain.       Objective:   Physical Exam  Constitutional: She is oriented to person, place, and time. She appears well-developed and well-nourished.  Neck: Normal range of motion. Neck supple.  Cardiovascular: Normal rate and regular rhythm.  Pulmonary/Chest: Effort normal and breath sounds normal.  Abdominal: Soft. Bowel sounds are normal. She exhibits no mass. There is tenderness. There is guarding. There is no rebound.  Neurological: She is alert and oriented to person, place, and time.  Skin: Skin is warm and dry. No rash noted.  Vitals reviewed.      Assessment & Plan:  Janet Choi was seen today for acute visit.  Diagnoses and all orders for this visit:  LLQ pain with possible hematuria -     CBC with Differential/Platelet -     COMPLETE METABOLIC PANEL WITH GFR -     Urinalysis, Routine w reflex microscopic -     Urine Culture No rebound, check labs Treat for cipro/flagyl to cover GI/UTI -     metroNIDAZOLE (FLAGYL) 500 MG tablet; Take 1 tablet (500 mg total) by mouth 3 (three) times daily  for 7 days. -     ciprofloxacin (CIPRO) 500 MG tablet; Take 1 tablet (500 mg total) by mouth 2 (two) times daily for 7 days. -     MICROSCOPIC MESSAGE  The patient was advised to call immediately if she has any concerning symptoms in the interval. The patient voices understanding of current treatment options and is in agreement with the current care plan.The patient knows to call the clinic with any problems, questions or concerns or go to the ER if any further progression of symptoms.

## 2018-04-05 NOTE — Patient Instructions (Signed)
Start on cipro/flaygl for diverticulitis Do liquid diet for a few days with jello/brothes then go to bowel rest Bowel rest means no wheat/fiber, so please eat white stuff like potatoes, soup, crackers until you are feeling better and then slowly advance your diet back to veggies and fiber like wheat.   Please go to the ER if you have any severe AB pain, unable to hold down food/water, blood in stool or vomit, chest pain, shortness of breath, or any worsening symptoms.     Diverticulitis Diverticulitis is inflammation or infection of small pouches in your colon that form when you have a condition called diverticulosis. The pouches in your colon are called diverticula. Your colon, or large intestine, is where water is absorbed and stool is formed. Complications of diverticulitis can include:  Bleeding.  Severe infection.  Severe pain.  Perforation of your colon.  Obstruction of your colon.  What are the causes? Diverticulitis is caused by bacteria. Diverticulitis happens when stool becomes trapped in diverticula. This allows bacteria to grow in the diverticula, which can lead to inflammation and infection. What increases the risk? People with diverticulosis are at risk for diverticulitis. Eating a diet that does not include enough fiber from fruits and vegetables may make diverticulitis more likely to develop. What are the signs or symptoms? Symptoms of diverticulitis may include:  Abdominal pain and tenderness. The pain is normally located on the left side of the abdomen, but may occur in other areas.  Fever and chills.  Bloating.  Cramping.  Nausea.  Vomiting.  Constipation.  Diarrhea.  Blood in your stool.  How is this diagnosed? Your health care provider will ask you about your medical history and do a physical exam. You may need to have tests done because many medical conditions can cause the same symptoms as diverticulitis. Tests may include:  Blood  tests.  Urine tests.  Imaging tests of the abdomen, including X-rays and CT scans.  When your condition is under control, your health care provider may recommend that you have a colonoscopy. A colonoscopy can show how severe your diverticula are and whether something else is causing your symptoms. How is this treated? Most cases of diverticulitis are mild and can be treated at home. Treatment may include:  Taking over-the-counter pain medicines.  Following a clear liquid diet.  Taking antibiotic medicines by mouth for 7-10 days.  More severe cases may be treated at a hospital. Treatment may include:  Not eating or drinking.  Taking prescription pain medicine.  Receiving antibiotic medicines through an IV tube.  Receiving fluids and nutrition through an IV tube.  Surgery.  Follow these instructions at home:  Follow your health care provider's instructions carefully.  Follow a full liquid diet or other diet as directed by your health care provider. After your symptoms improve, your health care provider may tell you to change your diet. He or she may recommend you eat a high-fiber diet. Fruits and vegetables are good sources of fiber. Fiber makes it easier to pass stool.  Take fiber supplements or probiotics as directed by your health care provider.  Only take medicines as directed by your health care provider.  Keep all your follow-up appointments. Contact a health care provider if:  Your pain does not improve.  You have a hard time eating food.  Your bowel movements do not return to normal. Get help right away if:  Your pain becomes worse.  Your symptoms do not get better.  Your symptoms suddenly  get worse.  You have a fever.  You have repeated vomiting.  You have bloody or black, tarry stools. This information is not intended to replace advice given to you by your health care provider. Make sure you discuss any questions you have with your health care  provider. Document Released: 02/03/2005 Document Revised: 10/02/2015 Document Reviewed: 03/21/2013 Elsevier Interactive Patient Education  2017 [employer].

## 2018-04-06 LAB — COMPLETE METABOLIC PANEL WITH GFR
AG Ratio: 1.6 (calc) (ref 1.0–2.5)
ALKALINE PHOSPHATASE (APISO): 84 U/L (ref 33–130)
ALT: 11 U/L (ref 6–29)
AST: 10 U/L (ref 10–35)
Albumin: 3.7 g/dL (ref 3.6–5.1)
BUN/Creatinine Ratio: 10 (calc) (ref 6–22)
BUN: 13 mg/dL (ref 7–25)
CALCIUM: 9.4 mg/dL (ref 8.6–10.4)
CO2: 26 mmol/L (ref 20–32)
CREATININE: 1.29 mg/dL — AB (ref 0.60–0.93)
Chloride: 104 mmol/L (ref 98–110)
GFR, Est African American: 46 mL/min/{1.73_m2} — ABNORMAL LOW (ref >=60)
GFR, Est Non African American: 40 mL/min/{1.73_m2} — ABNORMAL LOW (ref >=60)
Globulin: 2.3 g/dL (calc) (ref 1.9–3.7)
Glucose, Bld: 351 mg/dL — ABNORMAL HIGH (ref 65–99)
POTASSIUM: 4.8 mmol/L (ref 3.5–5.3)
SODIUM: 141 mmol/L (ref 135–146)
Total Bilirubin: 0.4 mg/dL (ref 0.2–1.2)
Total Protein: 6 g/dL — ABNORMAL LOW (ref 6.1–8.1)

## 2018-04-06 LAB — URINE CULTURE
MICRO NUMBER:: 91432175
SPECIMEN QUALITY:: ADEQUATE

## 2018-04-06 LAB — URINALYSIS, ROUTINE W REFLEX MICROSCOPIC
BILIRUBIN URINE: NEGATIVE
HYALINE CAST: NONE SEEN /LPF
Ketones, ur: NEGATIVE
NITRITE: POSITIVE — AB
RBC / HPF: >=60 /HPF — AB (ref 0–2)
Specific Gravity, Urine: 1.026 (ref 1.001–1.03)

## 2018-04-06 LAB — CBC WITH DIFFERENTIAL/PLATELET
BASOS ABS: 19 {cells}/uL (ref 0–200)
Basophils Relative: 0.6 %
EOS PCT: 3.5 %
Eosinophils Absolute: 109 cells/uL (ref 15–500)
HEMATOCRIT: 37.1 % (ref 35.0–45.0)
HEMOGLOBIN: 12 g/dL (ref 11.7–15.5)
LYMPHS ABS: 1017 {cells}/uL (ref 850–3900)
MCH: 30 pg (ref 27.0–33.0)
MCHC: 32.3 g/dL (ref 32.0–36.0)
MCV: 92.8 fL (ref 80.0–100.0)
MPV: 10.3 fL (ref 7.5–12.5)
Monocytes Relative: 4.5 %
NEUTROS ABS: 1817 {cells}/uL (ref 1500–7800)
NEUTROS PCT: 58.6 %
Platelets: 192 10*3/uL (ref 140–400)
RBC: 4 10*6/uL (ref 3.80–5.10)
RDW: 12.7 % (ref 11.0–15.0)
Total Lymphocyte: 32.8 %
WBC: 3.1 10*3/uL — ABNORMAL LOW (ref 3.8–10.8)
WBCMIX: 140 {cells}/uL — AB (ref 200–950)

## 2018-04-08 DIAGNOSIS — N132 Hydronephrosis with renal and ureteral calculous obstruction: Secondary | ICD-10-CM | POA: Diagnosis not present

## 2018-04-08 DIAGNOSIS — Z87891 Personal history of nicotine dependence: Secondary | ICD-10-CM | POA: Diagnosis not present

## 2018-04-08 DIAGNOSIS — E1122 Type 2 diabetes mellitus with diabetic chronic kidney disease: Secondary | ICD-10-CM | POA: Diagnosis not present

## 2018-04-08 DIAGNOSIS — Z794 Long term (current) use of insulin: Secondary | ICD-10-CM | POA: Diagnosis not present

## 2018-04-08 DIAGNOSIS — Z7982 Long term (current) use of aspirin: Secondary | ICD-10-CM | POA: Diagnosis not present

## 2018-04-08 DIAGNOSIS — Z79899 Other long term (current) drug therapy: Secondary | ICD-10-CM | POA: Diagnosis not present

## 2018-04-08 DIAGNOSIS — M1990 Unspecified osteoarthritis, unspecified site: Secondary | ICD-10-CM | POA: Diagnosis not present

## 2018-04-08 DIAGNOSIS — I129 Hypertensive chronic kidney disease with stage 1 through stage 4 chronic kidney disease, or unspecified chronic kidney disease: Secondary | ICD-10-CM | POA: Diagnosis not present

## 2018-04-08 DIAGNOSIS — N189 Chronic kidney disease, unspecified: Secondary | ICD-10-CM | POA: Diagnosis not present

## 2018-04-08 DIAGNOSIS — I2510 Atherosclerotic heart disease of native coronary artery without angina pectoris: Secondary | ICD-10-CM | POA: Diagnosis not present

## 2018-04-08 DIAGNOSIS — Z96653 Presence of artificial knee joint, bilateral: Secondary | ICD-10-CM | POA: Diagnosis not present

## 2018-04-08 DIAGNOSIS — N184 Chronic kidney disease, stage 4 (severe): Secondary | ICD-10-CM | POA: Diagnosis not present

## 2018-04-08 DIAGNOSIS — F329 Major depressive disorder, single episode, unspecified: Secondary | ICD-10-CM | POA: Diagnosis not present

## 2018-04-08 DIAGNOSIS — I252 Old myocardial infarction: Secondary | ICD-10-CM | POA: Diagnosis not present

## 2018-04-08 DIAGNOSIS — K219 Gastro-esophageal reflux disease without esophagitis: Secondary | ICD-10-CM | POA: Diagnosis not present

## 2018-04-08 DIAGNOSIS — E785 Hyperlipidemia, unspecified: Secondary | ICD-10-CM | POA: Diagnosis not present

## 2018-04-08 DIAGNOSIS — K5792 Diverticulitis of intestine, part unspecified, without perforation or abscess without bleeding: Secondary | ICD-10-CM | POA: Diagnosis not present

## 2018-04-12 ENCOUNTER — Encounter: Payer: Self-pay | Admitting: Internal Medicine

## 2018-04-12 ENCOUNTER — Ambulatory Visit (INDEPENDENT_AMBULATORY_CARE_PROVIDER_SITE_OTHER): Payer: PPO | Admitting: Internal Medicine

## 2018-04-12 VITALS — BP 124/64 | HR 76 | Temp 97.5°F | Resp 18 | Ht 63.0 in | Wt 247.2 lb

## 2018-04-12 DIAGNOSIS — E559 Vitamin D deficiency, unspecified: Secondary | ICD-10-CM

## 2018-04-12 DIAGNOSIS — IMO0002 Reserved for concepts with insufficient information to code with codable children: Secondary | ICD-10-CM

## 2018-04-12 DIAGNOSIS — N23 Unspecified renal colic: Secondary | ICD-10-CM

## 2018-04-12 DIAGNOSIS — N183 Chronic kidney disease, stage 3 (moderate): Secondary | ICD-10-CM

## 2018-04-12 DIAGNOSIS — Z9989 Dependence on other enabling machines and devices: Secondary | ICD-10-CM | POA: Diagnosis not present

## 2018-04-12 DIAGNOSIS — Z794 Long term (current) use of insulin: Secondary | ICD-10-CM

## 2018-04-12 DIAGNOSIS — I10 Essential (primary) hypertension: Secondary | ICD-10-CM | POA: Diagnosis not present

## 2018-04-12 DIAGNOSIS — E1122 Type 2 diabetes mellitus with diabetic chronic kidney disease: Secondary | ICD-10-CM | POA: Diagnosis not present

## 2018-04-12 DIAGNOSIS — Z23 Encounter for immunization: Secondary | ICD-10-CM | POA: Diagnosis not present

## 2018-04-12 DIAGNOSIS — Z9861 Coronary angioplasty status: Secondary | ICD-10-CM

## 2018-04-12 DIAGNOSIS — E1142 Type 2 diabetes mellitus with diabetic polyneuropathy: Secondary | ICD-10-CM

## 2018-04-12 DIAGNOSIS — E1165 Type 2 diabetes mellitus with hyperglycemia: Secondary | ICD-10-CM

## 2018-04-12 DIAGNOSIS — G4733 Obstructive sleep apnea (adult) (pediatric): Secondary | ICD-10-CM | POA: Diagnosis not present

## 2018-04-12 DIAGNOSIS — I2510 Atherosclerotic heart disease of native coronary artery without angina pectoris: Secondary | ICD-10-CM | POA: Diagnosis not present

## 2018-04-12 DIAGNOSIS — Z79899 Other long term (current) drug therapy: Secondary | ICD-10-CM

## 2018-04-12 DIAGNOSIS — E782 Mixed hyperlipidemia: Secondary | ICD-10-CM | POA: Diagnosis not present

## 2018-04-12 MED ORDER — GABAPENTIN 100 MG PO CAPS: ORAL_CAPSULE | ORAL | 1 refills | Status: DC

## 2018-04-12 NOTE — Patient Instructions (Signed)

## 2018-04-12 NOTE — Progress Notes (Signed)
This very nice 77 y.o. DWF presents for 3 month follow up with HTN, HLD, Pre-Diabetes and Vitamin D Deficiency.  Patient was recently treated on Nov 27 for suspected UTI with Cipro and then on Nov 30 she was evaluated in a [facility]  ER in [place], [place] and CT Abd showed a 3 mm Left ureteral stone & she was treated with IVF & Morphine. At discharge, she was rx'd Zofran, Tamsulosin & Norco. Today she feels her Lt flank/groin pains have resolved.      Patient is treated for HTN since 1997 & BP has been controlled at home. Today's BP is at goal - 124/64.  Patient has ASCAD and in Dec 2017, she had a NSTEMI and had PCA/Stenting x 2. Patient has had no complaints of any cardiac type chest pain, palpitations, dyspnea / orthopnea / PND, dizziness, claudication, or dependent edema.     Hyperlipidemia is controlled with diet & meds. Patient denies myalgias or other med SE's. Last Lipids were  Lab Results  Component Value Date   CHOL 124 01/04/2018   HDL 39 (L) 01/04/2018   LDLCALC 58 01/04/2018   TRIG 207 (H) 01/04/2018   CHOLHDL 3.2 01/04/2018      Also, the patient has history of  Gluttony/severe Morbid  Obesity (BMI 43+) and T2_NIDDM and is followed by Dr Marval Regal for her CKD3. She denies  symptoms of reactive hypoglycemia, diabetic polys or visual blurring.  She has peripheral sensory neuropathy with sx's of burning dysthesias of her distal LE's. She's been intolerant to high doses of Gabapentin due to lethargy, but is agreeable to retry lower doses.  Her dietary compliance has chronically been poor and her med compliance is also suspect. . Last A1c was not at goal: Lab Results  Component Value Date   HGBA1C 9.1 (H) 01/04/2018      Further, the patient also has history of Vitamin D Deficiency ("25" / 2008)  and supplements vitamin D without any suspected side-effects. Last vitamin D was still very low: Lab Results  Component Value Date   VD25OH 25 (L) 10/04/2017   Current Outpatient Medications  on File Prior to Visit  Medication Sig  . acetaminophen (TYLENOL) 325 MG tablet Take 2 tablets (650 mg total) by mouth every 4 (four) hours as needed for headache or mild pain.  Marland Kitchen allopurinol (ZYLOPRIM) 100 MG tablet TAKE 1 TABLET DAILY TO PREVENT GOUT  . aspirin EC 81 MG EC tablet Take 1 tablet (81 mg total) by mouth daily.  Marland Kitchen atorvastatin (LIPITOR) 80 MG tablet Take 1 tablet (80 mg total) by mouth daily at 6 PM.  . buPROPion (WELLBUTRIN XL) 300 MG 24 hr tablet Take 1 tablet (300 mg total) by mouth every morning.  . Cholecalciferol (CVS VIT D 5000 HIGH-POTENCY) 5000 units capsule Take 5,000 Units by mouth 2 (two) times daily.   . ciprofloxacin (CIPRO) 500 MG tablet Take 1 tablet (500 mg total) by mouth 2 (two) times daily for 7 days.  . diclofenac sodium (VOLTAREN) 1 % GEL Apply 4 g topically 4 (four) times daily.  Marland Kitchen escitalopram (LEXAPRO) 10 MG tablet Take 1 tablet (10 mg total) by mouth daily.  . famotidine (PEPCID) 20 MG tablet One at bedtime  . FLECTOR 1.3 % PTCH APPLY 1 PATCH EVERY 12 HOURS AS NEEDED THEN OFF 12 HOURS  . furosemide (LASIX) 40 MG tablet TAKE 1 TABLET 3 TIMES A DAY FOR FLUID RETENTION  . gabapentin (NEURONTIN) 800 MG tablet  Take 1/2 to 1  tablet 3 to 4 x / day for chronic pain  . Insulin Glargine (BASAGLAR KWIKPEN) 100 UNIT/ML SOPN INJECT 0.7 MLS (70 UNITS TOTAL) INTO THE SKIN AT BEDTIME. NEED APPOINTMENT  . Insulin Pen Needle (BD PEN NEEDLE NANO U/F) 32G X 4 MM MISC USE 4 TIMES A DAY  . Lancets (ONETOUCH ULTRASOFT) lancets Use as instructed  . losartan (COZAAR) 50 MG tablet TAKE 1 TABLET BY MOUTH EVERY DAY  . nitroGLYCERIN (NITROSTAT) 0.4 MG SL tablet Place 1 tablet (0.4 mg total) under the tongue every 5 (five) minutes x 3 doses as needed for chest pain.  Marland Kitchen NOVOLOG FLEXPEN 100 UNIT/ML FlexPen INJECT 16-20 UNITS INTO THE SKIN 3 (THREE) TIMES DAILY WITH MEALS.  Marland Kitchen ONETOUCH VERIO test strip USE AS INSTRUCTED TO CHECK SUGAR 2 TIMES DAILY.  . pantoprazole (PROTONIX) 40 MG  tablet Take 1 tablet (40 mg total) by mouth daily. Take 30-60 min before first meal of the day  . rOPINIRole (REQUIP) 3 MG tablet TAKE 1 TABLET BY MOUTH 4 TIMES A DAY  . tiZANidine (ZANAFLEX) 4 MG tablet Take 1/2 to 1 tablet 3 x  /day as needed for muscle spasm  . colchicine 0.6 MG tablet Take 2 tablets first day of gout flare, then one a day until gout flare is better  . ezetimibe (ZETIA) 10 MG tablet Take 1 tablet (10 mg total) by mouth daily.  Marland Kitchen liraglutide (VICTOZA) 18 MG/3ML SOPN Inject 0.3 mLs (1.8 mg total) into the skin once a week for 12 doses.   No current facility-administered medications on file prior to visit.    Allergies  Allergen Reactions  . Brilinta [Ticagrelor] Shortness Of Breath and Other (See Comments)    Chest pain (also)  . Aspirin Other (See Comments)    Can tolerate in small doses (is already on a blood thinner AND has kidney disease)  . Nsaids Other (See Comments)    Patient is taking a blood thinner and has kidney disease  . Minocycline Hcl Other (See Comments)    Welts  . Oruvail [Ketoprofen] Other (See Comments)    Has kidney disease and is taking a blood thinner  . Tricor [Fenofibrate]     Reaction unknown  . Vasotec [Enalaprilat]     Reaction unknown  . Zinc     Reaction unknown   PMHx:   Past Medical History:  Diagnosis Date  . Anemia    hx (04/14/2016)  . Anxiety   . Arthritis    "severe in my back; hands; ankles" (04/14/2016)  . Basal cell carcinoma    "several burned off; one cut off"  . CAD in native artery    a. NSTEMI 04/2016 - s/p DES toLAD and LCx. PCI to LCx notable for microembolization during cath.  . Chest pain- reslved with stopping Brilinta now on Plaix 04/16/2016  . Chronic lower back pain   . CKD (chronic kidney disease), stage III ([place])   . Depression   . DJD (degenerative joint disease)   . Family history of adverse reaction to anesthesia    "half-sister used to get real sick" (04/14/2016)  . GERD (gastroesophageal reflux  disease)   . History of gout   . Hyperlipidemia   . Hypertension   . IBS (irritable bowel syndrome)   . Ischemic cardiomyopathy    a. 04/2016: EF 40-50% by cath, 50-55% +WMA by echo.  . 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 ([place]) ~ 2015  . Morbid  obesity ([place])   . NSTEMI (non-ST elevated myocardial infarction) ([place]) 04/14/2016  . OSA on CPAP   . Peripheral neuropathy   . Peripheral vascular disease ([place])   . RLS (restless legs syndrome)   . Spinal stenosis   . Type II diabetes mellitus ([place])   . Vitamin D deficiency    Immunization History  Administered Date(s) Administered  . DT 04/14/2015  . Hepatitis B 07/09/1999, 08/09/1999, 01/09/2000  . Influenza, High Dose Seasonal PF 03/20/2014, 02/14/2017  . Influenza,inj,quad, With Preservative 04/26/2016  . Influenza-Unspecified 03/28/2015, 04/14/2015, 02/14/2017  . Pneumococcal Conjugate-13 03/20/2014  . Pneumococcal Polysaccharide-23 10/15/2009  . Td 03/19/2004   Past Surgical History:  Procedure Laterality Date  . APPENDECTOMY    . BACK SURGERY    . BASAL CELL CARCINOMA EXCISION Left    leg  . CARDIAC CATHETERIZATION N/A 04/15/2016   Procedure: Left Heart Cath and Coronary Angiography;  Surgeon: Belva Crome, MD;  Location: [facility];  Service: Cardiovascular;  Laterality: N/A;  . CARDIAC CATHETERIZATION N/A 04/15/2016   Procedure: Coronary Stent Intervention;  Surgeon: Belva Crome, MD;  Location: [facility];  Service: Cardiovascular;  Laterality: N/A;  Mid LAD Mid CFX  . CATARACT EXTRACTION, BILATERAL Bilateral   . JOINT REPLACEMENT    . LUMBAR DISC SURGERY  X 2  . MELANOMA EXCISION Left    "towards the back of my neck"  . SHOULDER ARTHROSCOPY W/ ROTATOR CUFF REPAIR Left   . SHOULDER OPEN ROTATOR CUFF REPAIR Right   . TOTAL KNEE ARTHROPLASTY Bilateral    FHx:    Reviewed / unchanged  SHx:    Reviewed / unchanged   Systems Review:  Constitutional: Denies fever, chills, wt changes, headaches,  insomnia, fatigue, night sweats, change in appetite. Eyes: Denies redness, blurred vision, diplopia, discharge, itchy, watery eyes.  ENT: Denies discharge, congestion, post nasal drip, epistaxis, sore throat, earache, hearing loss, dental pain, tinnitus, vertigo, sinus pain, snoring.  CV: Denies chest pain, palpitations, irregular heartbeat, syncope, dyspnea, diaphoresis, orthopnea, PND, claudication or edema. Respiratory: denies cough, dyspnea, DOE, pleurisy, hoarseness, laryngitis, wheezing.  Gastrointestinal: Denies dysphagia, odynophagia, heartburn, reflux, water brash, abdominal pain or cramps, nausea, vomiting, bloating, diarrhea, constipation, hematemesis, melena, hematochezia  or hemorrhoids. Genitourinary: Denies dysuria, frequency, urgency, nocturia, hesitancy, discharge, hematuria or flank pain. Musculoskeletal: Denies arthralgias, myalgias, stiffness, jt. swelling, pain, limping or strain/sprain.  Skin: Denies pruritus, rash, hives, warts, acne, eczema or change in skin lesion(s). Neuro: No weakness, tremor, incoordination, spasms, paresthesia or pain. Psychiatric: Denies confusion, memory loss or sensory loss. Endo: Denies change in weight, skin or hair change.  Heme/Lymph: No excessive bleeding, bruising or enlarged lymph nodes.  Physical Exam  BP 124/64   Pulse 76   Temp (!) 97.5 F (36.4 C)   Resp 18   Ht 5\' 3"  (1.6 m)   Wt 247 lb 3.2 oz (112.1 kg)   BMI 43.79 kg/m   Appears  well nourished, well groomed  and in no distress.  Eyes: PERRLA, EOMs, conjunctiva no swelling or erythema. Sinuses: No frontal/maxillary tenderness ENT/Mouth: EAC's clear, TM's nl w/o erythema, bulging. Nares clear w/o erythema, swelling, exudates. Oropharynx clear without erythema or exudates. Oral hygiene is good. Tongue normal, non obstructing. Hearing intact.  Neck: Supple. Thyroid not palpable. Car 2+/2+ without bruits, nodes or JVD. Chest: Respirations nl with BS clear & equal w/o rales,  rhonchi, wheezing or stridor.  Cor: Heart sounds normal w/ regular rate and rhythm without sig. murmurs,  gallops, clicks or rubs. Peripheral pulses normal and equal  without edema.  Abdomen: Soft & bowel sounds normal. Non-tender w/o guarding, rebound, hernias, masses or organomegaly.  Lymphatics: Unremarkable.  Musculoskeletal: Full ROM all peripheral extremities, joint stability, 5/5 strength and normal gait.  Skin: Warm, dry without exposed rashes, lesions or ecchymosis apparent.  Neuro: Cranial nerves intact, reflexes equal bilaterally. Sensory-motor testing grossly intact. Tendon reflexes grossly intact.  Pysch: Alert & oriented x 3.  Insight and judgement nl & appropriate. No ideations.  Assessment and Plan:  1. Essential hypertension  - Continue medication, monitor blood pressure at home.  - Continue DASH diet.  Reminder to go to the ER if any CP,  SOB, nausea, dizziness, severe HA, changes vision/speech.  - Magnesium - TSH  2. Hyperlipidemia, mixed  - Continue diet/meds, exercise,& lifestyle modifications.  - Continue monitor periodic cholesterol/liver & renal functions   - Lipid panel - TSH  3. Uncontrolled type 2 diabetes mellitus with stage 3 chronic kidney disease, with long-term current use of insulin (HCC)  - Continue diet, exercise,  - lifestyle modifications.  - Monitor appropriate labs.  - Hemoglobin A1c  4. Vitamin D deficiency  - Continue supplementation.  - VITAMIN D 25 Hydroxyl  5. CAD S/P percutaneous coronary angioplasty  6. Diabetic polyneuropathy associated with type 2 diabetes mellitus (HCC)  - Hemoglobin A1c  7. OSA on CPAP  8. Renal colic on left side  - Patient defers urology consultation at present.   9. Need for prophylactic vaccination and inoculation against influenza  - Flu vaccine HIGH DOSE PF (Fluzone High dose)  10. Medication management  - Magnesium - Lipid panel - TSH - Hemoglobin A1c - VITAMIN D 25  Hydroxyl        Discussed  regular exercise, BP monitoring, weight control to achieve/maintain BMI less than 25 and discussed med and SE's. Recommended labs to assess and monitor clinical status with further disposition pending results of labs. Over 30 minutes of exam, counseling, chart review was performed.

## 2018-04-13 LAB — LIPID PANEL
CHOL/HDL RATIO: 3.5 (calc) (ref <5.0)
Cholesterol: 149 mg/dL (ref <200)
HDL: 43 mg/dL — AB (ref >50)
LDL CHOLESTEROL (CALC): 81 mg/dL
Non-HDL Cholesterol (Calc): 106 mg/dL (calc) (ref <130)
TRIGLYCERIDES: 151 mg/dL — AB (ref <150)

## 2018-04-13 LAB — HEMOGLOBIN A1C
Hgb A1c MFr Bld: 10.2 % of total Hgb — ABNORMAL HIGH (ref <5.7)
MEAN PLASMA GLUCOSE: 246 (calc)
eAG (mmol/L): 13.6 (calc)

## 2018-04-13 LAB — TSH: TSH: 1.16 mIU/L (ref 0.40–4.50)

## 2018-04-13 LAB — MAGNESIUM: Magnesium: 1.9 mg/dL (ref 1.5–2.5)

## 2018-04-13 LAB — VITAMIN D 25 HYDROXY (VIT D DEFICIENCY, FRACTURES): Vit D, 25-Hydroxy: 50 ng/mL (ref 30–100)

## 2018-04-14 ENCOUNTER — Other Ambulatory Visit: Payer: Self-pay

## 2018-04-14 NOTE — Patient Outreach (Signed)
Janet Choi [facility]) Care Management  04/14/2018  Janet Choi 05-28-1940 [id number]   TELEPHONE SCREENING Referral date: 04/13/18  Referral source: Insurance referral  Referral reason: expensive medications Insurance: [employer]  Telephone call to patient regarding referral from [place].  Member had been called for follow up from Ed visit for kidney stone.  States that her diabetes medications are expensive but she can afford them.  States she has the [place] also and she gets her Victoza, Water engineer filled with that plan so she does not get in the doughnut hole.  States her blood sugars have gone up since she has been sick and states she also had the flu this last month.  States she has not been eating right while she was sick.  States she knows what to do but does not always do it.  States she has had diabetes classes and has seen dietitians in the past.  Instructed on [place] [employer] services and benefits.  Declines pharmacy referral at this time as she feels her pharmacist she uses has provided her with all of the options for lower cost insulin and she does not want to change to drawing up her insulin in syringes.  Instructed she can not get pharmacy assistance from the drug companies until she is in the coverage gap Agreeable to referral to [facility] for diabetes/HTN disease mangement   PLAN: Plan to refer to [facility] for diabetes disease management Plan to send successful outreach letter. Peter Garter RN, Jackquline Denmark, CDE Care Management Coordinator [facility] Care Management [phone]

## 2018-04-17 DIAGNOSIS — H04123 Dry eye syndrome of bilateral lacrimal glands: Secondary | ICD-10-CM | POA: Diagnosis not present

## 2018-04-19 ENCOUNTER — Encounter: Payer: Self-pay | Admitting: Cardiovascular Disease

## 2018-04-19 ENCOUNTER — Other Ambulatory Visit: Payer: Self-pay | Admitting: Internal Medicine

## 2018-04-19 ENCOUNTER — Ambulatory Visit (INDEPENDENT_AMBULATORY_CARE_PROVIDER_SITE_OTHER): Payer: PPO | Admitting: Cardiovascular Disease

## 2018-04-19 VITALS — BP 110/62 | HR 85 | Ht 63.0 in | Wt 243.4 lb

## 2018-04-19 DIAGNOSIS — Z794 Long term (current) use of insulin: Secondary | ICD-10-CM | POA: Diagnosis not present

## 2018-04-19 DIAGNOSIS — E782 Mixed hyperlipidemia: Secondary | ICD-10-CM

## 2018-04-19 DIAGNOSIS — I2510 Atherosclerotic heart disease of native coronary artery without angina pectoris: Secondary | ICD-10-CM | POA: Diagnosis not present

## 2018-04-19 DIAGNOSIS — I5032 Chronic diastolic (congestive) heart failure: Secondary | ICD-10-CM

## 2018-04-19 DIAGNOSIS — I10 Essential (primary) hypertension: Secondary | ICD-10-CM

## 2018-04-19 DIAGNOSIS — Z9861 Coronary angioplasty status: Secondary | ICD-10-CM

## 2018-04-19 DIAGNOSIS — R0602 Shortness of breath: Secondary | ICD-10-CM | POA: Diagnosis not present

## 2018-04-19 DIAGNOSIS — E118 Type 2 diabetes mellitus with unspecified complications: Secondary | ICD-10-CM

## 2018-04-19 NOTE — Patient Instructions (Addendum)
Medication Instructions:  DECREASE YOUR LOSARTAN TO 50 MG 1/2 TABLET DAILY   If you need a refill on your cardiac medications before your next appointment, please call your pharmacy.   Lab work: NONE  Testing/Procedures: NONE   Follow-Up: At [facility], you and your health needs are our priority.  As part of our continuing mission to provide you with exceptional heart care, we have created designated Provider Care Teams.  These Care Teams include your primary Cardiologist (physician) and Advanced Practice Providers (APPs -  Physician Assistants and Nurse Practitioners) who all work together to provide you with the care you need, when you need it. You will need a follow up appointment in 2 months. You may see DR [facility]  or one of the following Advanced Practice Providers on your designated Care Team:   Kerin Ransom, PA-C Roby Lofts, [place] . Sande Rives, PA-C  Any Other Special Instructions Will Be Listed Below (If Applicable). MONITOR YOU BLOOD PRESSURE AT HOME AND BRING READINGS TO FOLLOW UP

## 2018-04-19 NOTE — Progress Notes (Signed)
Cardiology Office Note   Date:  04/19/2018   ID:  Garnet, Chatmon Jan 14, 1941, MRN [id number]  PCP:  [name], MD  Cardiologist:   Skeet Latch, MD   No chief complaint on file.     History of Present Illness: Janet Choi is a 77 y.o. female with CAD s/p NSTEMI with PCI of Janet LAD and LCx, hypertensive heart disease, hyperlipidemia, OSA, diabetes, morbid obesity and CKD III who presents for follow up.  Janet Choi presented to [facility] 04/2016 with NSTEMI.  She underwent LHC that revealed 95-99% mid LAD and LCx lesions that were successfully opened with Onyx Resolute DESs. Rash PCI of Janet left circumflex artery was complicated by microembolization. Left ventriculogram revealed LVEF 40-50%.  Subsequent echo revealed LVEF 50-55% with akinesis of Janet distal septal and apical myocardium. There is also akinesis of Janet apical inferior myocardium and grade 1 diastolic dysfunction.   She was readmitted with chest pain on Janet same day she was discharged from Janet hospital.  This was not felt to be recurrent ischemia but ticagrelor was switched to clopidogrel.  During that hospitalization Prozac was stopped due to interaction with clopidogrel. She followed up with Rosaria Ferries on 04/26/16 and was feeling weak but otherwise well.  She was again seen in Janet ED with chest pain on 12/20.  Imdur was added to her regimen.  Since her last appointment Janet Choi has been doing well.  She had Janet flue 3-4 weeks ago.  During that time Janet only medication she was able to keep down was insulin.  Since then she has developed diplopia and dizziness.  She saw her eye doctor who felt that Janet diplopia was due to dry eyes.  She has some drops and will follow up next month.  She feels constantly dizzy.  Janet dizziness occurs when she is sitting still as well as when she walks.  It is worse in Janet afternoon.  She denies worsening dizziness with turning her head.  She took a dose of meclizine which did  help.  She has not experienced any chest pain or shortness of breath lately.  In August she had an episode of chest pain and shortness of breath that occurred while [place] beach.  She was seen at [facility] and had a nuclear stress test that revealed LVEF 68% with no ischemia.  She had a fall last month and is still getting over it.  She has been using a cane.  Due to her her dizziness and diplopia she has not been driving for Janet last couple weeks.   Past Medical History:  Diagnosis Date  . Anemia    hx (04/14/2016)  . Anxiety   . Arthritis    "severe in my back; hands; ankles" (04/14/2016)  . Basal cell carcinoma    "several burned off; one cut off"  . CAD in native artery    a. NSTEMI 04/2016 - s/p DES toLAD and LCx. PCI to LCx notable for microembolization during cath.  . Chest pain- reslved with stopping Brilinta now on Plaix 04/16/2016  . Chronic lower back pain   . CKD (chronic kidney disease), stage III ([place])   . Depression   . DJD (degenerative joint disease)   . Family history of adverse reaction to anesthesia    "half-sister used to get real sick" (04/14/2016)  . GERD (gastroesophageal reflux disease)   . History of gout   . Hyperlipidemia   . Hypertension   .  IBS (irritable bowel syndrome)   . Ischemic cardiomyopathy    a. 04/2016: EF 40-50% by cath, 50-55% +WMA by echo.  . 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 ([place]) ~ 2015  . Morbid obesity ([place])   . NSTEMI (non-ST elevated myocardial infarction) ([place]) 04/14/2016  . OSA on CPAP   . Peripheral neuropathy   . Peripheral vascular disease ([place])   . RLS (restless legs syndrome)   . Spinal stenosis   . Type II diabetes mellitus ([place])   . Vitamin D deficiency     Past Surgical History:  Procedure Laterality Date  . APPENDECTOMY    . BACK SURGERY    . BASAL CELL CARCINOMA EXCISION Left    leg  . CARDIAC CATHETERIZATION N/A 04/15/2016   Procedure: Left Heart Cath and Coronary Angiography;  Surgeon: Belva Crome, MD;  Location: [facility];  Service: Cardiovascular;  Laterality: N/A;  . CARDIAC CATHETERIZATION N/A 04/15/2016   Procedure: Coronary Stent Intervention;  Surgeon: Belva Crome, MD;  Location: [facility];  Service: Cardiovascular;  Laterality: N/A;  Mid LAD Mid CFX  . CATARACT EXTRACTION, BILATERAL Bilateral   . JOINT REPLACEMENT    . LUMBAR DISC SURGERY  X 2  . MELANOMA EXCISION Left    "towards Janet back of my neck"  . SHOULDER ARTHROSCOPY W/ ROTATOR CUFF REPAIR Left   . SHOULDER OPEN ROTATOR CUFF REPAIR Right   . TOTAL KNEE ARTHROPLASTY Bilateral      Current Outpatient Medications  Medication Sig Dispense Refill  . acetaminophen (TYLENOL) 325 MG tablet Take 2 tablets (650 mg total) by mouth every 4 (four) hours as needed for headache or mild pain.    Marland Kitchen allopurinol (ZYLOPRIM) 100 MG tablet TAKE 1 TABLET DAILY TO PREVENT GOUT 90 tablet 3  . aspirin EC 81 MG EC tablet Take 1 tablet (81 mg total) by mouth daily.    Marland Kitchen atorvastatin (LIPITOR) 80 MG tablet Take 1 tablet (80 mg total) by mouth daily at 6 PM. 90 tablet 0  . buPROPion (WELLBUTRIN XL) 300 MG 24 hr tablet Take 1 tablet (300 mg total) by mouth every morning. 30 tablet 2  . Cholecalciferol ([employer] VIT D 5000 HIGH-POTENCY) 5000 units capsule Take 5,000 Units by mouth 2 (two) times daily.     . diclofenac sodium (VOLTAREN) 1 % GEL Apply 4 g topically 4 (four) times daily. 100 g 3  . escitalopram (LEXAPRO) 10 MG tablet Take 1 tablet (10 mg total) by mouth daily. 90 tablet 3  . famotidine (PEPCID) 20 MG tablet One at bedtime 30 tablet 11  . FLECTOR 1.3 % PTCH APPLY 1 PATCH EVERY 12 HOURS AS NEEDED THEN OFF 12 HOURS 30 patch 0  . furosemide (LASIX) 40 MG tablet TAKE 1 TABLET 3 TIMES A DAY FOR FLUID RETENTION 270 tablet 1  . gabapentin (NEURONTIN) 100 MG capsule Take 1 to 2 capsules 3 to 4 x  /day for painful Diabetic Neuropathy. 360 capsule 1  . Insulin Glargine (BASAGLAR KWIKPEN) 100 UNIT/ML SOPN INJECT 0.7 MLS (70  UNITS TOTAL) INTO Janet SKIN AT BEDTIME. NEED APPOINTMENT 21 pen 0  . Insulin Pen Needle (BD PEN NEEDLE NANO U/F) 32G X 4 MM MISC USE 4 TIMES A DAY 300 each 11  . Lancets (ONETOUCH ULTRASOFT) lancets Use as instructed 100 each 12  . losartan (COZAAR) 50 MG tablet Take 50 mg by mouth as directed. TAKE 1/2 TABLET DAILY    . nitroGLYCERIN (  NITROSTAT) 0.4 MG SL tablet Place 1 tablet (0.4 mg total) under Janet tongue every 5 (five) minutes x 3 doses as needed for chest pain. 25 tablet 4  . NOVOLOG FLEXPEN 100 UNIT/ML FlexPen INJECT 16-20 UNITS INTO Janet SKIN 3 (THREE) TIMES DAILY WITH MEALS. 21 pen 6  . ONETOUCH VERIO test strip USE AS INSTRUCTED TO CHECK SUGAR 2 TIMES DAILY. 200 each 4  . pantoprazole (PROTONIX) 40 MG tablet Take 1 tablet (40 mg total) by mouth daily. Take 30-60 min before first meal of Janet day 30 tablet 2  . rOPINIRole (REQUIP) 3 MG tablet TAKE 1 TABLET BY MOUTH 4 TIMES A DAY 360 tablet 1  . tiZANidine (ZANAFLEX) 4 MG tablet Take 1/2 to 1 tablet 3 x  /day as needed for muscle spasm 90 tablet 1  . colchicine 0.6 MG tablet Take 2 tablets first day of gout flare, then one a day until gout flare is better 90 tablet 1  . ezetimibe (ZETIA) 10 MG tablet Take 1 tablet (10 mg total) by mouth daily. 90 tablet 3  . liraglutide (VICTOZA) 18 MG/3ML SOPN Inject 0.3 mLs (1.8 mg total) into Janet skin once a week for 12 doses. 6 pen 3   No current facility-administered medications for this visit.     Allergies:   Brilinta [ticagrelor]; Aspirin; Nsaids; Minocycline hcl; Oruvail [ketoprofen]; Tricor [fenofibrate]; Vasotec [enalaprilat]; and Zinc    Social History:  Janet Choi  reports that she quit smoking about 34 years ago. Her smoking use included cigarettes. She has a 62.50 pack-year smoking history. She has quit using smokeless tobacco. She reports that she does not drink alcohol or use drugs.   Family History:  Janet Choi's family history includes AAA (abdominal aortic aneurysm) in her father;  Heart disease in her mother and son; Kidney disease in her father.    ROS:  Please see Janet history of present illness.   Otherwise, review of systems are positive for nephrolithiasis.   All other systems are reviewed and negative.    PHYSICAL EXAM: VS:  BP 110/62   Pulse 85   Ht 5\' 3"  (1.6 m)   Wt 243 lb 6.4 oz (110.4 kg)   BMI 43.12 kg/m  , BMI Body mass index is 43.12 kg/m. GENERAL:  Well appearing HEENT: Pupils equal round and reactive, fundi not visualized, oral mucosa unremarkable NECK:  No jugular venous distention, waveform within normal limits, carotid upstroke brisk and symmetric, no bruits, no thyromegaly LYMPHATICS:  No cervical adenopathy LUNGS:  Clear to auscultation bilaterally HEART:  RRR.  PMI not displaced or sustained,S1 and S2 within normal limits, no S3, no S4, no clicks, no rubs, no  murmurs ABD:  Flat, positive bowel sounds normal in frequency in pitch, no bruits, no rebound, no guarding, no midline pulsatile mass, no hepatomegaly, no splenomegaly EXT:  2 plus pulses throughout, 1+ LE edema, no cyanosis no clubbing SKIN:  No rashes no nodules NEURO:  Cranial nerves II through XII grossly intact, motor grossly intact throughout PSYCH:  Cognitively intact, oriented to person place and time    EKG:  EKG is not ordered today.  Echo 04/15/16: Study Conclusions  - Left ventricle: Janet cavity size was normal. There was moderate   focal basal and mild concentric hypertrophy. Systolic function   was normal. Janet estimated ejection fraction was in Janet range of   50% to 55%. There is akinesis of Janet distal septal and apical   myocardium. There is akinesis of  Janet apicalinferior myocardium.   There was an increased relative contribution of atrial   contraction to ventricular filling. Doppler parameters are   consistent with abnormal left ventricular relaxation (grade 1   diastolic dysfunction). Doppler parameters are consistent with   high ventricular filling  pressure. - Aortic valve: Mildly to moderately calcified annulus. Mildly   calcified leaflets. - Mitral valve: Calcified annulus. Mildly calcified leaflets .   There was trivial regurgitation. - Atrial septum: No defect or patent foramen ovale was identified.  LHC12/7/17: Non-ST elevation myocardial infarction with identification of 97-02% segmental mid LAD stenosis and focal eccentric 95-99% mid circumflex stenosis.  Janet first diagonal is small and contains segmental 80% stenosis. Janet coronaries are otherwise widely patent.  Successful drug-eluting stent using Onyx Resolute to reduce a 95% mid LAD stenosis to 0% with TIMI grade 3 flow. Final stent diameter 3.0 mm.  Successful drug-eluting stent using Onyx Resolute to reduce a 95% mid circumflex stenosis to 0% with TIMI grade 3 flow. Post stent diameter 3.5 mm. Intervention on this vessel was, complicated by microembolization to Janet Janet most distal obtuse marginal beyond a bifurcation.  Mild hypokinesis of Janet anterior wall. EF 40-50%.  Recent Labs: 04/05/2018: ALT 11; BUN 13; Creat 1.29; Hemoglobin 12.0; Platelets 192; Potassium 4.8; Sodium 141 04/12/2018: Magnesium 1.9; TSH 1.16    Lipid Panel    Component Value Date/Time   CHOL 149 04/12/2018 1607   TRIG 151 (H) 04/12/2018 1607   HDL 43 (L) 04/12/2018 1607   CHOLHDL 3.5 04/12/2018 1607   VLDL 64 (H) 09/27/2016 1527   LDLCALC 81 04/12/2018 1607      Wt Readings from Last 3 Encounters:  04/19/18 243 lb 6.4 oz (110.4 kg)  04/12/18 247 lb 3.2 oz (112.1 kg)  04/05/18 244 lb 12.8 oz (111 kg)      ASSESSMENT AND PLAN:  # CAD s/p NSTEMI:  # Hyperlipidemia: # Hypertriglyceridemia:   Janet Choi had an NSTEMI 04/2016.  She is doing well and has no angina. She had a nuclear stress test 12/2017 that was negative for ischemia.  Continue aspirin, atorvastatin and Zetia.  LDL was 81 04/2018.  However she had not been taking her cholesterol medication for nearly a month due to her flu.   Repeat lipids in 6 months.   # Hypertension: Blood pressure is low to normal.  She also reports dizziness.  She attributes this to losartan but I suspect it is vertigo after Janet flu.  We will try reducing losartan to 25mg  and she will track her BP at home.  Goal is <130/80.  # Morbid obesity: Encouraged to increase 90-150 minutes of exercise weekly.  # Chronic diastolic heart failure: Continue lasix and hypertension regimen as above.  # Diabetes:  Recommend adding an SGLT2 inhibitor for CV and mortality benefit.    Current medicines are reviewed at length with Janet Choi today.  Janet Choi has concerns regarding medicines.  Janet following changes have been made:  Reduce losartan to 12.5mg   Labs/ tests ordered today include:   No orders of Janet defined types were placed in this encounter.    Disposition:   FU with Cohan Stipes C. Oval Linsey, MD, [facility] in 2 months    Signed, Karee Forge C. Oval Linsey, MD, [facility]  04/19/2018 1:53 PM    [facility]

## 2018-04-26 ENCOUNTER — Other Ambulatory Visit: Payer: Self-pay | Admitting: Adult Health

## 2018-05-08 ENCOUNTER — Encounter: Payer: Self-pay | Admitting: Physician Assistant

## 2018-05-15 ENCOUNTER — Other Ambulatory Visit: Payer: Self-pay

## 2018-05-15 NOTE — Patient Outreach (Signed)
[facility]) Care Management  05/15/2018  SHEKINA CORDELL Oct 15, 1940 [id number]    1st unsuccessful outreach to the patient for initial assessment.  Called the number twice and the message states that the number is no longer working.    Plan:  RN Health Coach will send the patient a letter.  RN Health Coach will outreach the patient within thirty days.  Lazaro Arms RN, BSN, [place] Direct Dial:  [phone]  Fax: [phone]

## 2018-06-06 ENCOUNTER — Other Ambulatory Visit: Payer: Self-pay | Admitting: Internal Medicine

## 2018-06-06 MED ORDER — BASAGLAR KWIKPEN 100 UNIT/ML ~~LOC~~ SOPN: 70.0000 [IU] | PEN_INJECTOR | Freq: Every day | SUBCUTANEOUS | 0 refills | Status: DC

## 2018-06-09 ENCOUNTER — Other Ambulatory Visit: Payer: Self-pay

## 2018-06-09 NOTE — Patient Outreach (Signed)
[facility]) Care Management  06/09/2018  KARYL SHARRAR November 22, 1940 [id number]    2nd attempt to outreach the patient for initial assessment.  No answer.  Called and left a HIPAA compliant message with the patient's sister Ines Bloomer ( on ROI).  Plan: RN Health Coach will make another outreach attempt to the patient within thirty business days.  Lazaro Arms RN, BSN, [place] Direct Dial:  [phone]  Fax: [phone]

## 2018-06-27 ENCOUNTER — Other Ambulatory Visit: Payer: Self-pay | Admitting: Physician Assistant

## 2018-06-27 ENCOUNTER — Encounter: Payer: Self-pay | Admitting: Physician Assistant

## 2018-06-27 DIAGNOSIS — E1142 Type 2 diabetes mellitus with diabetic polyneuropathy: Secondary | ICD-10-CM | POA: Insufficient documentation

## 2018-06-27 DIAGNOSIS — F3341 Major depressive disorder, recurrent, in partial remission: Secondary | ICD-10-CM

## 2018-06-27 NOTE — Progress Notes (Signed)
MEDICARE ANNUAL WELLNESS VISIT AND FOLLOW UP  Assessment:   Essential hypertension - continue medications, DASH diet, exercise and monitor at home. Call if greater than 130/80.  -     CBC with Differential/Platelet -     BASIC METABOLIC PANEL WITH GFR -     Hepatic function panel -     TSH  NSTEMI (non-ST elevated myocardial infarction) (HCC) Control blood pressure, cholesterol, glucose, increase exercise.   Hypertensive heart disease without heart failure - continue medications, DASH diet, exercise and monitor at home. Call if greater than 130/80.  -     BASIC METABOLIC PANEL WITH GFR  CAD in native artery Control blood pressure, cholesterol, glucose, increase exercise.   OSA on CPAP Sleep apnea- continue CPAP, weight loss advised.   Controlled type 2 diabetes mellitus with stage 3 chronic kidney disease, without long-term current use of insulin ([place]) Discussed general issues about diabetes pathophysiology and management., Educational material distributed., Suggested low cholesterol diet., Encouraged aerobic exercise., Discussed foot care., Reminded to get yearly retinal exam. -     BASIC METABOLIC PANEL WITH GFR  Uncontrolled type 2 diabetes mellitus with stage 3 chronic kidney disease, with long-term current use of insulin ([place]) Discussed general issues about diabetes pathophysiology and management., Educational material distributed., Suggested low cholesterol diet., Encouraged aerobic exercise., Discussed foot care., Reminded to get yearly retinal exam. -     BASIC METABOLIC PANEL WITH GFR  CKD (chronic kidney disease), stage III Increase fluids, avoid NSAIDS, monitor sugars, will monitor - may need referral nephrology -     BASIC METABOLIC PANEL WITH GFR  Hyperlipidemia -continue medications, check lipids, decrease fatty foods, increase activity.  -     Lipid panel  Diverticulosis of large intestine without hemorrhage  History of colonic polyps  Morbid Obesity with  co morbidities - long discussion about weight loss, diet, and exercise  Vitamin D deficiency -     VITAMIN D 25 Hydroxy (Vit-D Deficiency, Fractures)  Medication management -     Magnesium  RLS (restless legs syndrome) Check iron  Medicare annual wellness visit, subsequent Declines MGM  Iron deficiency anemia, unspecified iron deficiency anemia type -     Iron and TIBC -     Ferritin  B12 Get on B12, may be causing some symptoms.   Depression Stop wellbutrin, ? If causing anxiety/symptoms, start on lexapro  Morbid obesity - follow up 3 months for progress monitoring - increase veggies, decrease carbs - long discussion about weight loss, diet, and exercise  Diabetic polyneuropathy associated with type 2 diabetes mellitus (HCC) -     Hemoglobin A1c Discussed disease progression and risks Discussed diet/exercise, weight management and risk modification  Type 2 diabetes mellitus with hyperlipidemia (HCC) -     Hemoglobin A1c check lipids decrease fatty foods increase activity.   Dry eye -     Sjogrens syndrome-A extractable nuclear antibody -     Sjogrens syndrome-B extractable nuclear antibody  Imbalance -     MR Brain W Wo Contrast; Future - get on B12 but with 3 episodes, 1 with confusion will get MRI to rule out TIA Close follow up 6 weeks She was informed to call 911 if she develop any new symptoms such as worsening headaches, episodes of blurred vision, double vision or complete loss of vision or speech difficulties or motor weakness.  Gout, unspecified cause, unspecified chronicity, unspecified site -     Uric acid - get back on allopurinol   Future  Appointments  Date Time Provider [place]  07/03/2018  2:20 PM Skeet Latch, MD [facility]  07/10/2018 11:00 AM Lazaro Arms, RN [facility] None  08/09/2018 11:30 AM Vicie Mutters, PA-C [facility] None     Plan:   During the course of the visit the patient was educated and counseled  about appropriate screening and preventive services including:    Pneumococcal vaccine   Influenza vaccine  Td vaccine  Screening electrocardiogram  Screening mammography  Bone densitometry screening  Colorectal cancer screening  Diabetes screening  Glaucoma screening  Nutrition counseling   Advanced directives: given info/requested  Subjective:   Janet Choi is a 78 y.o. female who presents for Medicare Annual Wellness Visit and 3 month follow up on hypertension,diabetes, hyperlipidemia, vitamin D def, poor compliance.   Brought her friend Tami with her, quit job at [employer] but to severe hand pain, had injections with Dr. Grandville Silos. Dr. Berenice Primas for her lower back pain left radicular pain and left arm, had ESI this past Thursday with Dr. Mina Marble, helping some. She is still driving some, not at night. Declines MGM, willing to get colonoscopy  She states she was had an episode in Oct of dizziness and not knowing where she was while driving, and had 2 falls since Jan due to imbalance. She was having double vision and dizziness, states saw eye doctor for dry eyes and started on drops that have helped. She has had neg ANA, AntiDNA, RF, RNP.   Her blood pressure has been controlled at home, she is on verapamil, lasix, today [name] is BP: 128/74 She does not workout. She denies chest pain, shortness of breath, dizziness.   She had NSTEMI with PCI LAD and LCx 04/2016, on plavix follows Cardio, Dr. Oval Linsey, on ASA.   She is on cholesterol medication, lipitor and zetia denies myalgias. Her cholesterol is at goal. The cholesterol last visit was:   Lab Results  Component Value Date   CHOL 149 04/12/2018   HDL 43 (L) 04/12/2018   LDLCALC 81 04/12/2018   TRIG 151 (H) 04/12/2018   CHOLHDL 3.5 04/12/2018   She has been working on diet and exercise for diabetes, she is on bASA and on ACE/ARB, she is on basaglar 70 units at night, and novolog 16-20 units with meals. She is not  checking her sugars still. She will take her morning insulin but will forget to take her dinner time sugar, she does not eat lunch and does not take any insulin at lunch. She admits to not taking insulin as directed and not having a schedule for eating/sleeping.  She has one touch ultra. She is following Dr. 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 Last A1C in the office was:  Lab Results  Component Value Date   HGBA1C 10.2 (H) 04/12/2018   Lab Results  Component Value Date   CREATININE 1.29 (H) 04/05/2018   BUN 13 04/05/2018   NA 141 04/05/2018   K 4.8 04/05/2018   CL 104 04/05/2018   CO2 26 04/05/2018   Last GFR, she does not see nephrology.  Lab Results  Component Value Date   GFRNONAA 40 (L) 04/05/2018   Patient is on Vitamin D supplement. Lab Results  Component Value Date   VD25OH 50 04/12/2018   BMI is Body mass index is 42.44 kg/m., she is working on diet and exercise and has done well.  She states she just lost her best friend of 14 years, her sister (61 years old ) recently diagnosed with dementia. She has been depressed and worried about her memory. She states he is hungry all the time.  Wt Readings from Last 3 Encounters:  06/29/18 239 lb 9.6 oz (108.7 kg)  04/19/18 243 lb 6.4 oz (110.4 kg)  04/12/18 247 lb 3.2 oz (112.1 kg)    Names of Other Physician/Practitioners you currently use: 1. [facility]- here for primary care 2. Dr. Satira Sark, eye doctor, last visit 05/2018 3. Dr. Luretha Rued, dentist, q 6 months 4. Dr. Lorin Mercy, spinal stenosis.  Patient Care Team: [name], MD as PCP - General (Internal Medicine) Marygrace Drought, MD as Consulting Physician (Ophthalmology) Inda Castle, MD (Inactive) as Consulting Physician (Gastroenterology) Larey Dresser, MD as Consulting Physician (Cardiology) Nobie Putnam, MD as Consulting  Physician (Oncology) Tania Ade, MD as Consulting Physician (Orthopedic Surgery) Lazaro Arms, RN as [facility]  Medication Review  Current Outpatient Medications (Endocrine & Metabolic):  Marland Kitchen  Insulin Glargine (BASAGLAR KWIKPEN) 100 UNIT/ML SOPN, Inject 0.7 mLs (70 Units total) into the skin at bedtime. Marland Kitchen  NOVOLOG FLEXPEN 100 UNIT/ML FlexPen, INJECT 16-20 UNITS INTO THE SKIN 3 (THREE) TIMES DAILY WITH MEALS. Marland Kitchen  liraglutide (VICTOZA) 18 MG/3ML SOPN, Inject 0.3 mLs (1.8 mg total) into the skin once a week for 12 doses.  Current Outpatient Medications (Cardiovascular):  .  atorvastatin (LIPITOR) 80 MG tablet, Take 1 tablet (80 mg total) by mouth daily at 6 PM. .  furosemide (LASIX) 40 MG tablet, TAKE 1 TABLET 3 TIMES A DAY FOR FLUID RETENTION .  losartan (COZAAR) 50 MG tablet, TAKE 1 TABLET BY MOUTH EVERY DAY .  nitroGLYCERIN (NITROSTAT) 0.4 MG SL tablet, Place 1 tablet (0.4 mg total) under the tongue every 5 (five) minutes x 3 doses as needed for chest pain. Marland Kitchen  ezetimibe (ZETIA) 10 MG tablet, TAKE 1 TABLET BY MOUTH EVERY DAY   Current Outpatient Medications (Analgesics):  .  acetaminophen (TYLENOL) 325 MG tablet, Take 2 tablets (650 mg total) by mouth every 4 (four) hours as needed for headache or mild pain. Marland Kitchen  allopurinol (ZYLOPRIM) 100 MG tablet, TAKE 1 TABLET DAILY TO PREVENT GOUT .  aspirin EC 81 MG EC tablet, Take 1 tablet (81 mg total) by mouth daily. .  colchicine 0.6 MG tablet, Take 2 tablets first day of gout flare, then one a day until gout flare is better   Current Outpatient Medications (Other):  Marland Kitchen  buPROPion (WELLBUTRIN XL) 300 MG 24 hr tablet, TAKE 1 TABLET BY MOUTH EVERY DAY .  Cholecalciferol ([employer] VIT D 5000 HIGH-POTENCY) 5000 units capsule, Take 5,000 Units by mouth 2 (two) times daily.  .  diclofenac sodium (VOLTAREN) 1 % GEL, Apply 4 g topically 4 (four) times daily. Marland Kitchen  escitalopram (LEXAPRO) 10 MG tablet, Take 1 tablet (10 mg total)  by mouth daily. .  famotidine (PEPCID) 20 MG tablet, One at bedtime .  FLECTOR 1.3 % PTCH, APPLY 1 PATCH EVERY 12 HOURS AS NEEDED THEN OFF 12 HOURS .  gabapentin (NEURONTIN) 100 MG capsule, Take 1 to 2 capsules 3 to 4 x  /day for painful Diabetic Neuropathy. .  Insulin Pen Needle (BD PEN NEEDLE NANO U/F) 32G X 4 MM MISC, USE 4 TIMES A DAY .  Lancets (ONETOUCH ULTRASOFT) lancets, Use as instructed .  ONETOUCH VERIO test strip, USE AS INSTRUCTED TO CHECK SUGAR  2 TIMES DAILY. Marland Kitchen  OVER THE COUNTER MEDICATION,  .  pantoprazole (PROTONIX) 40 MG tablet, Take 1 tablet (40 mg total) by mouth daily. Take 30-60 min before first meal of the day .  rOPINIRole (REQUIP) 3 MG tablet, TAKE 1 TABLET BY MOUTH 4 TIMES A DAY .  tiZANidine (ZANAFLEX) 4 MG tablet, Take 1/2 to 1 tablet 3 x  /day as needed for muscle spasm  Current Problems (verified) Patient Active Problem List   Diagnosis Date Noted  . Diabetic polyneuropathy associated with type 2 diabetes mellitus ([place]) 06/27/2018  . Upper airway cough syndrome 01/24/2018  . DJD (degenerative joint disease), lumbosacral 06/02/2017  . DOE (dyspnea on exertion) 04/19/2016  . Anxiety 04/19/2016  . CAD S/P percutaneous coronary angioplasty   . History of non-ST elevation myocardial infarction (NSTEMI) 04/14/2016  . CKD (chronic kidney disease), stage III ([place]) 04/14/2016  . Iron deficiency anemia 08/15/2015  . OSA on CPAP 04/16/2015  . RLS (restless legs syndrome) 03/20/2014  . Type 2 diabetes mellitus with hyperlipidemia ([place]) 03/20/2014  . Medication management 09/12/2013  . Morbid obesity due to excess calories ([place]) 05/14/2013  . Vitamin D deficiency 05/14/2013  . Dyslipidemia 02/24/2009  . Essential hypertension 02/24/2009  . Diverticulosis of large intestine 02/24/2009  . History of colonic polyps 02/24/2009    Screening Tests Immunization History  Administered Date(s) Administered  . DT 04/14/2015  . Hepatitis B 07/09/1999, 08/09/1999,  01/09/2000  . Influenza, High Dose Seasonal PF 03/20/2014, 02/14/2017, 04/12/2018  . Influenza,inj,quad, With Preservative 04/26/2016  . Influenza-Unspecified 03/28/2015, 04/14/2015, 02/14/2017  . Pneumococcal Conjugate-13 03/20/2014  . Pneumococcal Polysaccharide-23 10/15/2009  . Td 03/19/2004    Preventative care: Last colonoscopy: 2010 with Dr. Deatra Ina Last mammogram: 2012 OVERDUE BUT DECLINES Last pap smear/pelvic exam: remote [id number] [place] AB 2007 Echo 2017 EF 63-33%, diastolic grade 1 dysfunction. Cath 04/2016 EF 40-50% PFT 01/2018  EYE exam Jan 2020 [facility]  Prior vaccinations: TD or Tdap: 2016  Influenza: 2018 Pneumococcal: 2011  Prevnar 13 2015 Shingles/Zostavax: declines due to cost  History reviewed: allergies, current medications, past family history, past medical history, past social history, past surgical history and problem list  Allergies Allergies  Allergen Reactions  . Brilinta [Ticagrelor] Shortness Of Breath and Other (See Comments)    Chest pain (also)  . Aspirin Other (See Comments)    Can tolerate in small doses (is already on a blood thinner AND has kidney disease)  . Nsaids Other (See Comments)    Patient is taking a blood thinner and has kidney disease  . Minocycline Hcl Other (See Comments)    Welts  . Oruvail [Ketoprofen] Other (See Comments)    Has kidney disease and is taking a blood thinner  . Tricor [Fenofibrate]     Reaction unknown  . Vasotec [Enalaprilat]     Reaction unknown  . Zinc     Reaction unknown    SURGICAL HISTORY She  has a past surgical history that includes Total knee arthroplasty (Bilateral); Lumbar disc surgery (X 2); Appendectomy; Joint replacement; Back surgery; Melanoma excision (Left); Excision basal cell carcinoma (Left); Shoulder open rotator cuff repair (Right); Shoulder arthroscopy w/ rotator cuff repair (Left); Cataract extraction, bilateral (Bilateral); Cardiac catheterization (N/A, 04/15/2016); and Cardiac  catheterization (N/A, 04/15/2016). FAMILY HISTORY Her family history includes AAA (abdominal aortic aneurysm) in her father; Heart disease in her mother and son; Kidney disease in her father. SOCIAL HISTORY She  reports that she quit smoking about 34 years ago. Her smoking  use included cigarettes. She has a 62.50 pack-year smoking history. She has quit using smokeless tobacco. She reports that she does not drink alcohol or use drugs.  MEDICARE WELLNESS OBJECTIVES: Physical activity: Current Exercise Habits: The patient does not participate in regular exercise at present, Exercise limited by: orthopedic condition(s) Cardiac risk factors: Cardiac Risk Factors include: advanced age (>92men, >23 women);diabetes mellitus;dyslipidemia;family history of premature cardiovascular disease;hypertension;obesity (BMI >30kg/m2);sedentary lifestyle Depression/mood screen:   Depression screen [facility] 2/9 06/29/2018  Decreased Interest 0  Down, Depressed, Hopeless 0  PHQ - 2 Score 0  Some recent data might be hidden    ADLs:  In your present state of health, do you have any difficulty performing the following activities: 06/29/2018 04/12/2018  Hearing? N N  Vision? N N  Difficulty concentrating or making decisions? Y N  Walking or climbing stairs? Y N  Dressing or bathing? N N  Doing errands, shopping? Y N  Comment still driving but not as much, Tami will drive now -  Some recent data might be hidden     Cognitive Testing  Alert? Yes  Normal Appearance?Yes  Oriented to person? Yes  Place? Yes   Time? Yes  Recall of three objects?  Yes  Can perform simple calculations? Yes  Displays appropriate judgment?Yes  Can read the correct time from a watch face?Yes  EOL planning: Does Patient Have a Medical Advance Directive?: No Would patient like information on creating a medical advance directive?: No - Patient declined   Objective:   Blood pressure 128/74, pulse (!) 101, temperature 97.6 F (36.4 C),  height 5\' 3"  (1.6 m), weight 239 lb 9.6 oz (108.7 kg), SpO2 99 %. Body mass index is 42.44 kg/m.  General appearance: alert, no distress, WD/WN,  female HEENT: normocephalic, sclerae anicteric, TMs pearly, nares patent, no discharge or erythema, pharynx normal Oral cavity: MMM, no lesions Neck: supple, no lymphadenopathy, no thyromegaly, no masses Heart: RRR, normal S1, S2, no murmurs Lungs: CTA bilaterally, no wheezes, rhonchi, or rales Abdomen: +bs, soft, obese non tender, non distended, no masses, no hepatomegaly, no splenomegaly Musculoskeletal: nontender, no swelling, no obvious deformity Extremities: 1-2+ edema, no cyanosis, no clubbing Pulses: 2+ symmetric, upper and lower extremities, normal cap refill Neurological: alert, oriented x 3, CN2-12 intact, strength normal upper extremities and lower extremities, sensation decreased bilateral legs up to mid shin, DTRs 2+ throughout, no cerebellar signs, gait wide and antalgic Psychiatric: normal affect, behavior normal, pleasant  Breast: defer Gyn: defer Rectal: defer  Medicare Attestation I have personally reviewed: The patient's medical and social history Their use of alcohol, tobacco or illicit drugs Their current medications and supplements The patient's functional ability including ADLs,fall risks, home safety risks, cognitive, and hearing and visual impairment Diet and physical activities Evidence for depression or mood disorders  The patient's weight, height, BMI, and visual acuity have been recorded in the chart.  I have made referrals, counseling, and provided education to the patient based on review of the above and I have provided the patient with a written personalized care plan for preventive services.     Vicie Mutters, PA-C   06/29/2018

## 2018-06-28 DIAGNOSIS — M65331 Trigger finger, right middle finger: Secondary | ICD-10-CM | POA: Diagnosis not present

## 2018-06-28 DIAGNOSIS — M65332 Trigger finger, left middle finger: Secondary | ICD-10-CM | POA: Diagnosis not present

## 2018-06-29 ENCOUNTER — Encounter: Payer: Self-pay | Admitting: Physician Assistant

## 2018-06-29 ENCOUNTER — Other Ambulatory Visit: Payer: Self-pay | Admitting: Cardiology

## 2018-06-29 ENCOUNTER — Ambulatory Visit (INDEPENDENT_AMBULATORY_CARE_PROVIDER_SITE_OTHER): Payer: PPO | Admitting: Physician Assistant

## 2018-06-29 VITALS — BP 128/74 | HR 101 | Temp 97.6°F | Ht 63.0 in | Wt 239.6 lb

## 2018-06-29 DIAGNOSIS — I252 Old myocardial infarction: Secondary | ICD-10-CM

## 2018-06-29 DIAGNOSIS — D509 Iron deficiency anemia, unspecified: Secondary | ICD-10-CM

## 2018-06-29 DIAGNOSIS — M109 Gout, unspecified: Secondary | ICD-10-CM

## 2018-06-29 DIAGNOSIS — E559 Vitamin D deficiency, unspecified: Secondary | ICD-10-CM

## 2018-06-29 DIAGNOSIS — E118 Type 2 diabetes mellitus with unspecified complications: Secondary | ICD-10-CM

## 2018-06-29 DIAGNOSIS — I10 Essential (primary) hypertension: Secondary | ICD-10-CM | POA: Diagnosis not present

## 2018-06-29 DIAGNOSIS — E1169 Type 2 diabetes mellitus with other specified complication: Secondary | ICD-10-CM

## 2018-06-29 DIAGNOSIS — Z79899 Other long term (current) drug therapy: Secondary | ICD-10-CM | POA: Diagnosis not present

## 2018-06-29 DIAGNOSIS — Z9861 Coronary angioplasty status: Secondary | ICD-10-CM

## 2018-06-29 DIAGNOSIS — E538 Deficiency of other specified B group vitamins: Secondary | ICD-10-CM

## 2018-06-29 DIAGNOSIS — Z0000 Encounter for general adult medical examination without abnormal findings: Secondary | ICD-10-CM

## 2018-06-29 DIAGNOSIS — Z86010 Personal history of colonic polyps: Secondary | ICD-10-CM

## 2018-06-29 DIAGNOSIS — N183 Chronic kidney disease, stage 3 unspecified: Secondary | ICD-10-CM

## 2018-06-29 DIAGNOSIS — R2689 Other abnormalities of gait and mobility: Secondary | ICD-10-CM

## 2018-06-29 DIAGNOSIS — I2510 Atherosclerotic heart disease of native coronary artery without angina pectoris: Secondary | ICD-10-CM | POA: Diagnosis not present

## 2018-06-29 DIAGNOSIS — G2581 Restless legs syndrome: Secondary | ICD-10-CM

## 2018-06-29 DIAGNOSIS — R0609 Other forms of dyspnea: Secondary | ICD-10-CM | POA: Diagnosis not present

## 2018-06-29 DIAGNOSIS — K5730 Diverticulosis of large intestine without perforation or abscess without bleeding: Secondary | ICD-10-CM

## 2018-06-29 DIAGNOSIS — R6889 Other general symptoms and signs: Secondary | ICD-10-CM

## 2018-06-29 DIAGNOSIS — H04129 Dry eye syndrome of unspecified lacrimal gland: Secondary | ICD-10-CM

## 2018-06-29 DIAGNOSIS — E785 Hyperlipidemia, unspecified: Secondary | ICD-10-CM

## 2018-06-29 DIAGNOSIS — Z0001 Encounter for general adult medical examination with abnormal findings: Secondary | ICD-10-CM

## 2018-06-29 DIAGNOSIS — G4733 Obstructive sleep apnea (adult) (pediatric): Secondary | ICD-10-CM

## 2018-06-29 DIAGNOSIS — E1142 Type 2 diabetes mellitus with diabetic polyneuropathy: Secondary | ICD-10-CM

## 2018-06-29 DIAGNOSIS — Z9989 Dependence on other enabling machines and devices: Secondary | ICD-10-CM

## 2018-06-29 DIAGNOSIS — F419 Anxiety disorder, unspecified: Secondary | ICD-10-CM

## 2018-06-29 DIAGNOSIS — M47817 Spondylosis without myelopathy or radiculopathy, lumbosacral region: Secondary | ICD-10-CM

## 2018-06-29 DIAGNOSIS — Z794 Long term (current) use of insulin: Secondary | ICD-10-CM

## 2018-06-29 MED ORDER — TIZANIDINE HCL 4 MG PO TABS: ORAL_TABLET | ORAL | 1 refills | Status: DC

## 2018-06-29 NOTE — Patient Instructions (Addendum)
RANGE OF A1C   Your A1C is a measure of your sugar over the past 3 months and is not affected by what you have eaten over the past few days. Diabetes increases your chances of stroke and heart attack over 300 % and is the leading cause of blindness and kidney failure in the [place]. Please make sure you decrease bad carbs like white bread, white rice, potatoes, corn, soft drinks, pasta, cereals, refined sugars, sweet tea, dried fruits, and fruit juice. Good carbs are okay to eat in moderation like sweet potatoes, brown rice, whole grain pasta/bread, most fruit (except dried fruit) and you can eat as many veggies as you want.   Greater than 6.5 is considered diabetic. Between 6.4 and 5.7 is prediabetic If your A1C is less than 5.7 you are NOT diabetic.  Targets for Glucose Readings: Time of Check Target for patients WITHOUT Diabetes Target for DIABETICS  Before Meals Less than 100  less than 150  Two hours after meals Less than 200  Less than 250      Vitamin B12 Deficiency  SUBLINGUAL B12 ADD IT ADD ON IRON SLOW RELEASE ONCE A DAY STOP THE WELLBUTRIN  Vitamin B12 deficiency occurs when the body does not have enough vitamin B12. Vitamin B12 is an important vitamin. The body needs vitamin B12:  To make red blood cells.  To make DNA. This is the genetic material inside cells.  To help the nerves work properly so they can carry messages from the brain to the body. Vitamin B12 deficiency can cause various health problems, such as a low red blood cell count (anemia) or nerve damage. What are the causes? This condition may be caused by:  Not eating enough foods that contain vitamin B12.  Not having enough stomach acid and digestive fluids to properly absorb vitamin B12 from the food that you eat.  Certain digestive system diseases that make it hard to absorb vitamin B12. These diseases include Crohn disease, chronic pancreatitis, and cystic fibrosis.  Pernicious anemia. This is  a condition in which the body does not make enough of a protein (intrinsic factor), resulting in too few red blood cells.  Having a surgery in which part of the stomach or small intestine is removed.  Taking certain medicines that make it hard for the body to absorb vitamin B12. These medicines include: ? Heartburn medicine (antacids and proton pump inhibitors). ? An antibiotic medicine called neomycin. ? Some medicines that are used to treat diabetes, tuberculosis, gout, or high cholesterol. What increases the risk? The following factors may make you more likely to develop a B12 deficiency:  Being older than age 55.  Eating a vegetarian or vegan diet, especially while you are pregnant.  Eating a poor diet while you are pregnant.  Taking certain drugs.  Having alcoholism. What are the signs or symptoms? In some cases, there are no symptoms of this condition. If the condition leads to anemia or nerve damage, various symptoms can occur, such as:  Weakness.  Fatigue.  Numbness or tingling in your hands and feet.  Redness and burning of the tongue.  Confusion or memory problems.  Depression.  Sensory problems, such as color blindness, ringing in the ears, or loss of taste.  Diarrhea or constipation.  Trouble walking. If anemia is severe, symptoms can include:  Shortness of breath.  Dizziness.  Rapid heart rate (tachycardia).  How is this diagnosed? This condition may be diagnosed with a blood test to measure the  level of vitamin B12 in your blood. You may have other tests to help find the cause of your vitamin B12 deficiency. These tests may include:  A complete blood count (CBC). This is a group of tests that measure certain characteristics of blood cells.  A blood test to measure intrinsic factor.  An endoscopy. In this procedure, a thin tube with a camera on the end is used to look into your stomach or intestines. How is this treated? Treatment for this  condition depends on the cause. Common treatment options include:  Changing your eating and drinking habits, such as: ? Eating more foods that contain vitamin B12. ? Drinking less alcohol or no alcohol.  Taking vitamin B12 supplements. Your health care provider will tell you which dosage is best for you.  Getting vitamin B12 injections. Follow these instructions at home:  Take supplements only as told by your health care provider. Follow the directions carefully.  Get any injections that are prescribed by your health care provider.  Do not miss your appointments.  Eat lots of healthy foods that contain vitamin B12. Ask your health care provider if you should work with a dietitian. Foods that contain vitamin B12 include: ? Meat. ? Meat from birds (poultry). ? Fish. ? Eggs. ? Cereal and dairy products that are fortified. This means that vitamin B12 has been added to the food. Check the label on the package to see if the food is fortified.  Do not abuse alcohol.  Keep all follow-up visits as told by your health care provider. This is important. Contact a health care provider if:  Your symptoms come back. Get help right away if:  You develop shortness of breath.  You have chest pain.  You become dizzy or you lose consciousness. This information is not intended to replace advice given to you by your health care provider. Make sure you discuss any questions you have with your health care provider. Document Released: 07/19/2011 Document Revised: 10/08/2015 Document Reviewed: 09/11/2014 Elsevier Interactive Patient Education  2019 [place] can start on allopurinol 300mg , start on 1/2 pill a day This can sometime CAUSE a gout flare so I'm send in colchcine for you to take twice a day for 1 week  And I'm send in  Prednisone for you to take IF you get pain  Information for patients with Gout  Gout defined-Gout occurs when urate crystals accumulate in your joint causing  the inflammation and intense pain of gout attack.  Urate crystals can form when you have high levels of uric acid in your blood.  Your body produces uric acid when it breaks down prurines-substances that are found naturally in your body, as well as in certain foods such as organ meats, anchioves, herring, asparagus, and mushrooms.  Normally uric acid dissolves in your blood and passes through your kidneys into your urine.  But sometimes your body either produces too much uric acid or your kidneys excrete too little uric acid.  When this happens, uric acid can build up, forming sharp needle-like urate crystals in a joint or surrounding tissue that cause pain, inflammation and swelling.    Gout is characterized by sudden, severe attacks of pain, redness and tenderness in joints, often the joint at the base of the big toe.  Gout is complex form of arthritis that can affect anyone.  Men are more likely to get gout but women become increasingly more susceptible to gout after menopause.  An acute attack of gout  can wake you up in the middle of the night with the sensation that your big toe is on fire.  The affected joint is hot, swollen and so tender that even the weight or the sheet on it may seem intolerable.  If you experience symptoms of an acute gout attack it is important to your doctor as soon as the symptoms start.  Gout that goes untreated can lead to worsening pain and joint damage.  Risk Factors:  You are more likely to develop gout if you have high levels of uric acid in your body.    Factors that increase the uric acid level in your body include:  Lifestyle factors.  Excessive alcohol use-generally more than two drinks a day for men and more than one for women increase the risk of gout.  Medical conditions.  Certain conditions make it more likely that you will develop gout.  These include hypertension, and chronic conditions such as diabetes, high levels of fat and cholesterol in the blood,  and narrowing of the arteries.  Certain medications.  The uses of Thiazide diuretics- commonly used to treat hypertension and low dose aspirin can also increase uric acid levels.  Family history of gout.  If other members of your family have had gout, you are more likely to develop the disease.  Age and sex. Gout occurs more often in men than it does in women, primarily because women tend to have lower uric acid levels than men do.  Men are more likely to develop gout earlier usually between the ages of 25-50- whereas women generally develop signs and symptoms after menopause.    Tests and diagnosis:  Tests to help diagnose gout may include:  Blood test.  Your doctor may recommend a blood test to measure the uric acid level in your blood .  Blood tests can be misleading, though.  Some people have high uric acid levels but never experience gout.  And some people have signs and symptoms of gout, but don't have unusual levels of uric acid in their blood.  Joint fluid test.  Your doctor may use a needle to draw fluid from your affected joint.  When examined under the microscope, your joint fluid may reveal urate crystals.  Treatment:  Treatment for gout usually involves medications.  What medications you and your doctor choose will be based on your current health and other medications you currently take.  Gout medications can be used to treat acute gout attacks and prevent future attacks as well as reduce your risk of complications from gout such as the development of tophi from urate crystal deposits.  Alternative medicine:   Certain foods have been studied for their potential to lower uric acid levels, including:  Coffee.  Studies have found an association between coffee drinking (regular and decaf) and lower uric acid levels.  The evidence is not enough to encourage non-coffee drinkers to start, but it may give clues to new ways of treating gout in the future.  Vitamin C.  Supplements  containing vitamin C may reduce the levels of uric acid in your blood.  However, vitamin as a treatment for gout. Don't assume that if a little vitamin C is good, than lots is better.  Megadoses of vitamin C may increase your bodies uric acid levels.  Cherries.  Cherries have been associated with lower levels of uric acid in studies, but it isn't clear if they have any effect on gout signs and symptoms.  Eating more cherries and  other dar-colored fruits, such as blackberries, blueberries, purple grapes and raspberries, may be a safe way to support your gout treatment.    Lifestyle/Diet Recommendations:   Drink 8 to 16 cups ( about 2 to 4 liters) of fluid each day, with at least half being water.  Avoid alcohol  Eat a moderate amount of protein, preferably from healthy sources, such as low-fat or fat-free dairy, tofu, eggs, and nut butters.  Limit you daily intake of meat, fish, and poultry to 4 to 6 ounces.  Avoid high fat meats and desserts.  Decrease you intake of shellfish, beef, lamb, pork, eggs and cheese.  Choose a good source of vitamin C daily such as citrus fruits, strawberries, broccoli,  brussel sprouts, papaya, and cantaloupe.   Choose a good source of vitamin A every other day such as yellow fruits, or dark green/yellow vegetables.  Avoid drastic weigh reduction or fasting.  If weigh loss is desired lose it over a period of several months.  See "dietary considerations.." chart for specific food recommendations.  Dietary Considerations for people with Gout  Food with negligible purine content (0-15 mg of purine nitrogen per 100 grams food)  May use as desired except on calorie variations  Non fat milk Cocoa Cereals (except in list II) Hard candies  Buttermilk Carbonated drinks Vegetables (except in list II) Sherbet  Coffee Fruits Sugar Honey  Tea Cottage Cheese Gelatin-jell-o Salt  Fruit juice Breads Angel food Cake   Herbs/spices Jams/Jellies [employer]    Foods that do not contain excessive purine content, but must be limited due to fat content  Cream Eggs Oil and Salad Dressing  Half and Half Peanut Butter Chocolate  Whole Milk Cakes Potato Chips  Butter Ice Cream Fried Foods  Cheese Nuts Waffles, pancakes   List II: Food with moderate purine content (50-150 mg of purine nitrogen per 100 grams of food)  Limit total amount each day to 5 oz. cooked Lean meat, other than those on list III   Poultry, other than those on list III Fish, other than those on list III   Seafood, other than those on list III  These foods may be used occasionally  Peas Lentils Bran  Spinach Oatmeal Dried Beans and Peas  Asparagus Wheat Germ Mushrooms   Additional information about meat choices  Choose fish and poultry, particularly without skin, often.  Select lean, well trimmed cuts of meat.  Avoid all fatty meats, bacon , sausage, fried meats, fried fish, or poultry, luncheon meats, cold cuts, hot dogs, meats canned or frozen in gravy, spareribs and frozen and packaged prepared meats.   List III: Foods with HIGH purine content / Foods to AVOID (150-800 mg of purine nitrogen per 100 grams of food)  Anchovies Herring Meat Broths  Liver Mackerel Meat Extracts  Kidney Scallops Meat Drippings  Sardines Wild Game Mincemeat  Sweetbreads Goose Gravy  Heart Tongue Yeast, baker's and brewers   Commercial soups made with any of the foods listed in List II or List III  In addition avoid all alcoholic beverages

## 2018-07-01 LAB — CBC WITH DIFFERENTIAL/PLATELET
ABSOLUTE MONOCYTES: 207 {cells}/uL (ref 200–950)
BASOS ABS: 0 {cells}/uL (ref 0–200)
BASOS PCT: 0 %
Eosinophils Absolute: 0 cells/uL — ABNORMAL LOW (ref 15–500)
Eosinophils Relative: 0 %
HEMATOCRIT: 36.8 % (ref 35.0–45.0)
Hemoglobin: 11.8 g/dL (ref 11.7–15.5)
LYMPHS ABS: 564 {cells}/uL — AB (ref 850–3900)
MCH: 30.1 pg (ref 27.0–33.0)
MCHC: 32.1 g/dL (ref 32.0–36.0)
MCV: 93.9 fL (ref 80.0–100.0)
MPV: 11 fL (ref 7.5–12.5)
Monocytes Relative: 4.4 %
NEUTROS ABS: 3929 {cells}/uL (ref 1500–7800)
NEUTROS PCT: 83.6 %
PLATELETS: 205 10*3/uL (ref 140–400)
RBC: 3.92 10*6/uL (ref 3.80–5.10)
RDW: 14 % (ref 11.0–15.0)
Total Lymphocyte: 12 %
WBC: 4.7 10*3/uL (ref 3.8–10.8)

## 2018-07-01 LAB — COMPLETE METABOLIC PANEL WITH GFR
AG Ratio: 1.9 (calc) (ref 1.0–2.5)
ALBUMIN MSPROF: 4.3 g/dL (ref 3.6–5.1)
ALT: 13 U/L (ref 6–29)
AST: 12 U/L (ref 10–35)
Alkaline phosphatase (APISO): 88 U/L (ref 37–153)
BILIRUBIN TOTAL: 0.5 mg/dL (ref 0.2–1.2)
BUN / CREAT RATIO: 21 (calc) (ref 6–22)
BUN: 29 mg/dL — ABNORMAL HIGH (ref 7–25)
CHLORIDE: 100 mmol/L (ref 98–110)
CO2: 24 mmol/L (ref 20–32)
Calcium: 9.8 mg/dL (ref 8.6–10.4)
Creat: 1.35 mg/dL — ABNORMAL HIGH (ref 0.60–0.93)
GFR, EST AFRICAN AMERICAN: 44 mL/min/{1.73_m2} — AB (ref >=60)
GFR, Est Non African American: 38 mL/min/{1.73_m2} — ABNORMAL LOW (ref >=60)
Globulin: 2.3 g/dL (calc) (ref 1.9–3.7)
Glucose, Bld: 493 mg/dL — ABNORMAL HIGH (ref 65–99)
Potassium: 4.4 mmol/L (ref 3.5–5.3)
SODIUM: 139 mmol/L (ref 135–146)
TOTAL PROTEIN: 6.6 g/dL (ref 6.1–8.1)

## 2018-07-01 LAB — MAGNESIUM: MAGNESIUM: 2 mg/dL (ref 1.5–2.5)

## 2018-07-01 LAB — HEMOGLOBIN A1C
EAG (MMOL/L): 14.7 (calc)
HEMOGLOBIN A1C: 10.9 %{Hb} — AB (ref <5.7)
MEAN PLASMA GLUCOSE: 266 (calc)

## 2018-07-01 LAB — TSH: TSH: 0.58 mIU/L (ref 0.40–4.50)

## 2018-07-01 LAB — LIPID PANEL
CHOL/HDL RATIO: 2.8 (calc) (ref <5.0)
Cholesterol: 120 mg/dL (ref <200)
HDL: 43 mg/dL — ABNORMAL LOW (ref >=50)
LDL Cholesterol (Calc): 49 mg/dL (calc)
NON-HDL CHOLESTEROL (CALC): 77 mg/dL (ref <130)
Triglycerides: 216 mg/dL — ABNORMAL HIGH (ref <150)

## 2018-07-01 LAB — FERRITIN: Ferritin: 21 ng/mL (ref 16–288)

## 2018-07-01 LAB — SJOGRENS SYNDROME-A EXTRACTABLE NUCLEAR ANTIBODY: SSA (Ro) (ENA) Antibody, IgG: <1 AI

## 2018-07-01 LAB — SJOGRENS SYNDROME-B EXTRACTABLE NUCLEAR ANTIBODY: SSB (La) (ENA) Antibody, IgG: <1 AI

## 2018-07-01 LAB — IRON, TOTAL/TOTAL IRON BINDING CAP
%SAT: 13 % — AB (ref 16–45)
Iron: 59 ug/dL (ref 45–160)
TIBC: 443 mcg/dL (calc) (ref 250–450)

## 2018-07-01 LAB — URIC ACID: Uric Acid, Serum: 6.1 mg/dL (ref 2.5–7.0)

## 2018-07-01 LAB — VITAMIN B12: Vitamin B-12: 206 pg/mL (ref 200–1100)

## 2018-07-03 ENCOUNTER — Ambulatory Visit (INDEPENDENT_AMBULATORY_CARE_PROVIDER_SITE_OTHER): Payer: PPO | Admitting: Cardiovascular Disease

## 2018-07-03 ENCOUNTER — Encounter: Payer: Self-pay | Admitting: Cardiovascular Disease

## 2018-07-03 VITALS — BP 92/50 | HR 84 | Ht 61.0 in | Wt 234.2 lb

## 2018-07-03 DIAGNOSIS — I5032 Chronic diastolic (congestive) heart failure: Secondary | ICD-10-CM

## 2018-07-03 DIAGNOSIS — Z9861 Coronary angioplasty status: Secondary | ICD-10-CM

## 2018-07-03 DIAGNOSIS — I10 Essential (primary) hypertension: Secondary | ICD-10-CM

## 2018-07-03 DIAGNOSIS — I2510 Atherosclerotic heart disease of native coronary artery without angina pectoris: Secondary | ICD-10-CM | POA: Diagnosis not present

## 2018-07-03 DIAGNOSIS — R42 Dizziness and giddiness: Secondary | ICD-10-CM

## 2018-07-03 DIAGNOSIS — R0602 Shortness of breath: Secondary | ICD-10-CM | POA: Diagnosis not present

## 2018-07-03 NOTE — Patient Instructions (Signed)
Medication Instructions:  DECREASE YOUR LOSARTAN TO 25 MG DAILY   TAKE LASIX (FURSOMIDE) AS NEEDED FOR SWELLING   If you need a refill on your cardiac medications before your next appointment, please call your pharmacy.   Lab work: NONE  Testing/Procedures: NONE  Follow-Up: At [facility], you and your health needs are our priority.  As part of our continuing mission to provide you with exceptional heart care, we have created designated Provider Care Teams.  These Care Teams include your primary Cardiologist (physician) and Advanced Practice Providers (APPs -  Physician Assistants and Nurse Practitioners) who all work together to provide you with the care you need, when you need it. You will need a follow up appointment in 7 months.  Please call our office 2 months in advance to schedule this appointment.  You may see [facility] or one of the following Advanced Practice Providers on your designated Care Team:   Kerin Ransom, PA-C Roby Lofts, [place] . Sande Rives, PA-C  Your physician recommends that you schedule a follow-up appointment in: [place]

## 2018-07-03 NOTE — Progress Notes (Signed)
Cardiology Office Note   Date:  07/08/2018   ID:  Sacheen, Arrasmith January 09, 1941, MRN [id number]  PCP:  [name], MD  Cardiologist:   Skeet Latch, MD   No chief complaint on file.     History of Present Illness: Janet Choi is a 78 y.o. female with CAD s/p NSTEMI with PCI of the LAD and LCx, hypertensive heart disease, hyperlipidemia, OSA, diabetes, morbid obesity and CKD III who presents for follow up.  Janet Choi presented to [facility] with NSTEMI.  She underwent LHC that revealed 95-99% mid LAD and LCx lesions that were successfully opened with Onyx Resolute DESs. Rash PCI of the left circumflex artery was complicated by microembolization. Left ventriculogram revealed LVEF 40-50%.  Subsequent echo revealed LVEF 50-55% with akinesis of the distal septal and apical myocardium. There is also akinesis of the apical inferior myocardium and grade 1 diastolic dysfunction.   She was readmitted with chest pain on the same day she was discharged from the hospital.  This was not felt to be recurrent ischemia but ticagrelor was switched to clopidogrel.  During that hospitalization Prozac was stopped due to interaction with clopidogrel. She followed up with [facility] on 04/26/16 and was feeling weak but otherwise well.  She was again seen in the ED with chest pain 2 days later and Imdur was added to her regimen.  In August 2019 she had an episode of chest pain and shortness of breath that occurred while at the beach.  She was seen at [facility] and had a nuclear stress test that revealed LVEF 68% with no ischemia.  Since her last appointment Janet Choi has continued to struggle with falls.  She has had 2 falls in the last month.  Yesterday after eating she became very dizzy.  She was also short of breath.  She is unsure what her blood pressure was at the time.  Her dizziness occurred while sitting and there is no associated chest pain.  It lasted approximately 10 minutes.   This was followed by diarrhea when she was able to get up, though she did not have any abdominal pain or discomfort.  She notes that when she walks she often has to hold onto something and has not been feeling steady on her feet.  She has not been feeling very well for the last several months.  She notes that her blood sugar has been running very high and it was to 412 before she ate her meal that day.  She has been struggling with problems with her finger and has been getting steroid injections in that finger.  Since then her blood sugars have been more out of control.  She has not had any lower extremity edema, orthopnea, or PND.  She uses Lasix very rarely.   Past Medical History:  Diagnosis Date  . Anemia    hx (04/14/2016)  . Anxiety   . Arthritis    "severe in my back; hands; ankles" (04/14/2016)  . Atypical chest pain 01/24/2018   Midline pain absent supine "constant" daytime since 12/31/17 > resolved as of 02/20/2018 on gerd/ gas diet   . Basal cell carcinoma    "several burned off; one cut off"  . CAD in native artery    a. NSTEMI 04/2016 - s/p DES toLAD and LCx. PCI to LCx notable for microembolization during cath.  . Chest pain- reslved with stopping Brilinta now on Plaix 04/16/2016  . Chronic lower back pain   .  CKD (chronic kidney disease), stage III ([place])   . Depression   . DJD (degenerative joint disease)   . Family history of adverse reaction to anesthesia    "half-sister used to get real sick" (04/14/2016)  . GERD (gastroesophageal reflux disease)   . History of gout   . Hyperlipidemia   . Hypertension   . IBS (irritable bowel syndrome)   . Ischemic cardiomyopathy    a. 04/2016: EF 40-50% by cath, 50-55% +WMA by echo.  . 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 ([place]) ~ 2015  . Morbid obesity ([place])   . NSTEMI (non-ST elevated myocardial infarction) ([place]) 04/14/2016  . OSA on CPAP   . Peripheral neuropathy   . Peripheral vascular disease ([place])   . RLS (restless legs  syndrome)   . Spinal stenosis   . Type II diabetes mellitus ([place])   . Vitamin D deficiency     Past Surgical History:  Procedure Laterality Date  . APPENDECTOMY    . BACK SURGERY    . BASAL CELL CARCINOMA EXCISION Left    leg  . CARDIAC CATHETERIZATION N/A 04/15/2016   Procedure: Left Heart Cath and Coronary Angiography;  Surgeon: Belva Crome, MD;  Location: [place] [facility];  Service: Cardiovascular;  Laterality: N/A;  . CARDIAC CATHETERIZATION N/A 04/15/2016   Procedure: Coronary Stent Intervention;  Surgeon: Belva Crome, MD;  Location: [place] [facility];  Service: Cardiovascular;  Laterality: N/A;  Mid LAD Mid CFX  . CATARACT EXTRACTION, BILATERAL Bilateral   . JOINT REPLACEMENT    . LUMBAR DISC SURGERY  X 2  . MELANOMA EXCISION Left    "towards the back of my neck"  . SHOULDER ARTHROSCOPY W/ ROTATOR CUFF REPAIR Left   . SHOULDER OPEN ROTATOR CUFF REPAIR Right   . TOTAL KNEE ARTHROPLASTY Bilateral      Current Outpatient Medications  Medication Sig Dispense Refill  . acetaminophen (TYLENOL) 325 MG tablet Take 2 tablets (650 mg total) by mouth every 4 (four) hours as needed for headache or mild pain.    Marland Kitchen allopurinol (ZYLOPRIM) 100 MG tablet TAKE 1 TABLET DAILY TO PREVENT GOUT 90 tablet 3  . aspirin EC 81 MG EC tablet Take 1 tablet (81 mg total) by mouth daily.    Marland Kitchen atorvastatin (LIPITOR) 80 MG tablet Take 1 tablet (80 mg total) by mouth daily at 6 PM. 90 tablet 0  . buPROPion (WELLBUTRIN XL) 300 MG 24 hr tablet TAKE 1 TABLET BY MOUTH EVERY DAY 90 tablet 1  . Cholecalciferol ([facility] VIT D 5000 HIGH-POTENCY) 5000 units capsule Take 5,000 Units by mouth 2 (two) times daily.     . colchicine 0.6 MG tablet Take 2 tablets first day of gout flare, then one a day until gout flare is better 90 tablet 1  . diclofenac sodium (VOLTAREN) 1 % GEL Apply 4 g topically 4 (four) times daily. 100 g 3  . escitalopram (LEXAPRO) 10 MG tablet Take 1 tablet (10 mg total) by mouth daily. 90 tablet 3   . ezetimibe (ZETIA) 10 MG tablet TAKE 1 TABLET BY MOUTH EVERY DAY 90 tablet 3  . famotidine (PEPCID) 20 MG tablet One at bedtime 30 tablet 11  . ferrous sulfate 325 (65 FE) MG tablet Take 325 mg by mouth daily with breakfast.    . FLECTOR 1.3 % PTCH APPLY 1 PATCH EVERY 12 HOURS AS NEEDED THEN OFF 12 HOURS 30 patch 0  . furosemide (LASIX) 40 MG tablet Take  40 mg by mouth as needed for fluid.    Marland Kitchen gabapentin (NEURONTIN) 100 MG capsule Take 1 to 2 capsules 3 to 4 x  /day for painful Diabetic Neuropathy. 360 capsule 1  . Insulin Glargine (BASAGLAR KWIKPEN) 100 UNIT/ML SOPN Inject 0.7 mLs (70 Units total) into the skin at bedtime. 21 pen 0  . Insulin Pen Needle (BD PEN NEEDLE NANO U/F) 32G X 4 MM MISC USE 4 TIMES A DAY 300 each 11  . Lancets (ONETOUCH ULTRASOFT) lancets Use as instructed 100 each 12  . losartan (COZAAR) 50 MG tablet Take 50 mg by mouth as directed. TAKE 1/2 TABLET BY MOUTH DAILY    . nitroGLYCERIN (NITROSTAT) 0.4 MG SL tablet Place 1 tablet (0.4 mg total) under the tongue every 5 (five) minutes x 3 doses as needed for chest pain. 25 tablet 4  . NOVOLOG FLEXPEN 100 UNIT/ML FlexPen INJECT 16-20 UNITS INTO THE SKIN 3 (THREE) TIMES DAILY WITH MEALS. 21 pen 6  . ONETOUCH VERIO test strip USE AS INSTRUCTED TO CHECK SUGAR 2 TIMES DAILY. 200 each 4  . OVER THE COUNTER MEDICATION     . pantoprazole (PROTONIX) 40 MG tablet Take 1 tablet (40 mg total) by mouth daily. Take 30-60 min before first meal of the day 30 tablet 2  . rOPINIRole (REQUIP) 3 MG tablet TAKE 1 TABLET BY MOUTH 4 TIMES A DAY 360 tablet 1  . tiZANidine (ZANAFLEX) 4 MG tablet Take 1/2 to 1 tablet 3 x  /day as needed for muscle spasm 90 tablet 1  . vitamin B-12 (CYANOCOBALAMIN) 1000 MCG tablet Take 1,000 mcg by mouth daily.    Marland Kitchen liraglutide (VICTOZA) 18 MG/3ML SOPN Inject 0.3 mLs (1.8 mg total) into the skin once a week for 12 doses. 6 pen 3   No current facility-administered medications for this visit.     Allergies:    Brilinta [ticagrelor]; Aspirin; Nsaids; Minocycline hcl; Oruvail [ketoprofen]; Tricor [fenofibrate]; Vasotec [enalaprilat]; and Zinc    Social History:  The patient  reports that she quit smoking about 34 years ago. Her smoking use included cigarettes. She has a 62.50 pack-year smoking history. She has quit using smokeless tobacco. She reports that she does not drink alcohol or use drugs.   Family History:  The patient's family history includes AAA (abdominal aortic aneurysm) in her father; Heart disease in her mother and son; Kidney disease in her father.    ROS:  Please see the history of present illness.   Otherwise, review of systems are positive for nephrolithiasis.   All other systems are reviewed and negative.    PHYSICAL EXAM: VS:  BP (!) 92/50   Pulse 84   Ht 5\' 1"  (1.549 m)   Wt 234 lb 3.2 oz (106.2 kg)   BMI 44.25 kg/m  , BMI Body mass index is 44.25 kg/m. GENERAL:  Well appearing HEENT: Pupils equal round and reactive, fundi not visualized, oral mucosa unremarkable NECK:  No jugular venous distention, waveform within normal limits, carotid upstroke brisk and symmetric, no bruits, no thyromegaly LUNGS:  Clear to auscultation bilaterally HEART:  RRR.  PMI not displaced or sustained,S1 and S2 within normal limits, no S3, no S4, no clicks, no rubs, no murmurs ABD:  Flat, positive bowel sounds normal in frequency in pitch, no bruits, no rebound, no guarding, no midline pulsatile mass, no hepatomegaly, no splenomegaly EXT:  2 plus pulses throughout, trace edema, no cyanosis no clubbing SKIN:  No rashes no nodules NEURO:  Cranial nerves II through XII grossly intact, motor grossly intact throughout PSYCH:  Cognitively intact, oriented to person place and time   EKG:  EKG is ordered today. 07/03/18: Sinus rhythm.  Rate 84 bpm.  PACs.  LAD.  Prior anterolateral infarct. Absent R wave progression.   Echo 04/15/16: Study Conclusions  - Left ventricle: The cavity size was normal.  There was moderate   focal basal and mild concentric hypertrophy. Systolic function   was normal. The estimated ejection fraction was in the range of   50% to 55%. There is akinesis of the distal septal and apical   myocardium. There is akinesis of the apicalinferior myocardium.   There was an increased relative contribution of atrial   contraction to ventricular filling. Doppler parameters are   consistent with abnormal left ventricular relaxation (grade 1   diastolic dysfunction). Doppler parameters are consistent with   high ventricular filling pressure. - Aortic valve: Mildly to moderately calcified annulus. Mildly   calcified leaflets. - Mitral valve: Calcified annulus. Mildly calcified leaflets .   There was trivial regurgitation. - Atrial septum: No defect or patent foramen ovale was identified.  LHC12/7/17: Non-ST elevation myocardial infarction with identification of 80-99% segmental mid LAD stenosis and focal eccentric 95-99% mid circumflex stenosis.  The first diagonal is small and contains segmental 80% stenosis. The coronaries are otherwise widely patent.  Successful drug-eluting stent using Onyx Resolute to reduce a 95% mid LAD stenosis to 0% with TIMI grade 3 flow. Final stent diameter 3.0 mm.  Successful drug-eluting stent using Onyx Resolute to reduce a 95% mid circumflex stenosis to 0% with TIMI grade 3 flow. Post stent diameter 3.5 mm. Intervention on this vessel was, complicated by microembolization to the the most distal obtuse marginal beyond a bifurcation.  Mild hypokinesis of the anterior wall. EF 40-50%.  Recent Labs: 06/29/2018: ALT 13; BUN 29; Creat 1.35; Hemoglobin 11.8; Magnesium 2.0; Platelets 205; Potassium 4.4; Sodium 139; TSH 0.58    Lipid Panel    Component Value Date/Time   CHOL 120 06/29/2018 1105   TRIG 216 (H) 06/29/2018 1105   HDL 43 (L) 06/29/2018 1105   CHOLHDL 2.8 06/29/2018 1105   VLDL 64 (H) 09/27/2016 1527   LDLCALC 49 06/29/2018 1105       Wt Readings from Last 3 Encounters:  07/03/18 234 lb 3.2 oz (106.2 kg)  06/29/18 239 lb 9.6 oz (108.7 kg)  04/19/18 243 lb 6.4 oz (110.4 kg)      ASSESSMENT AND PLAN:  # CAD s/p NSTEMI:  # Hyperlipidemia: # Hypertriglyceridemia:   Janet Choi had an NSTEMI 04/2016.  She is doing well and has no angina. She had a nuclear stress test 12/2017 that was negative for ischemia.  Continue aspirin, atorvastatin and Zetia.  LDL was 49 06/2018.  # Hypertension: Blood pressure is low and she continues to struggle with dizziness.  We will reduce losartan to 25 mg.  # Morbid obesity: Encouraged to increase 90-150 minutes of exercise weekly.  # Chronic diastolic heart failure:  She is euvolemic on exam.  Continue lasix and hypertension regimen as above.  # Diabetes:  Recommend adding an SGLT2 inhibitor for CV and mortality benefit.    Current medicines are reviewed at length with the patient today.  The patient has concerns regarding medicines.  The following changes have been made:  Reduce losartan to 12.5mg   Labs/ tests ordered today include:   No orders of the defined types were placed in this encounter.  Disposition:   FU with Corinda Ammon C. Oval Linsey, MD, [facility] in 2 months    Signed, Jacquelyn Antony C. Oval Linsey, MD, [facility]  07/08/2018 8:16 AM    [place]

## 2018-07-04 ENCOUNTER — Ambulatory Visit: Payer: Self-pay | Admitting: Physician Assistant

## 2018-07-08 ENCOUNTER — Encounter: Payer: Self-pay | Admitting: Cardiovascular Disease

## 2018-07-10 ENCOUNTER — Other Ambulatory Visit: Payer: Self-pay

## 2018-07-10 NOTE — Patient Outreach (Signed)
[place] [facility]) Care Management  07/10/2018  Janet Choi 12-08-1940 [id number]    3rd outreach attempt to the patient for initial assessment. No answer. HIPAA compliant voicemail left with contact information.  Plan: RN Health Coach will make outreach attempt to the patient within thirty days.  Lazaro Arms RN, BSN, [place] Direct Dial:  [phone]  Fax: [phone]

## 2018-07-12 ENCOUNTER — Other Ambulatory Visit: Payer: Self-pay | Admitting: Cardiovascular Disease

## 2018-07-15 ENCOUNTER — Ambulatory Visit (HOSPITAL_COMMUNITY)
Admission: RE | Admit: 2018-07-15 | Discharge: 2018-07-15 | Disposition: A | Discharge status: Home / Self Care | Payer: PPO | Source: Ambulatory Visit | Attending: Physician Assistant | Admitting: Physician Assistant

## 2018-07-15 ENCOUNTER — Other Ambulatory Visit: Payer: Self-pay | Admitting: Physician Assistant

## 2018-07-15 DIAGNOSIS — R2689 Other abnormalities of gait and mobility: Secondary | ICD-10-CM

## 2018-07-15 DIAGNOSIS — G459 Transient cerebral ischemic attack, unspecified: Secondary | ICD-10-CM | POA: Diagnosis not present

## 2018-07-16 ENCOUNTER — Other Ambulatory Visit: Payer: Self-pay | Admitting: Physician Assistant

## 2018-07-16 DIAGNOSIS — R2689 Other abnormalities of gait and mobility: Secondary | ICD-10-CM

## 2018-07-22 ENCOUNTER — Other Ambulatory Visit: Payer: Self-pay | Admitting: Physician Assistant

## 2018-08-01 ENCOUNTER — Ambulatory Visit: Payer: PPO

## 2018-08-08 NOTE — Progress Notes (Deleted)
Assessment and Plan:  B12 Get on B12, may be causing some symptoms.   Depression Stop wellbutrin, ? If causing anxiety/symptoms, start on lexapro  Morbid obesity - follow up 3 months for progress monitoring - increase veggies, decrease carbs - long discussion about weight loss, diet, and exercise  Diabetic polyneuropathy associated with type 2 diabetes mellitus (HCC) -     Hemoglobin A1c Discussed disease progression and risks Discussed diet/exercise, weight management and risk modification  Type 2 diabetes mellitus with hyperlipidemia (HCC) -     Hemoglobin A1c check lipids decrease fatty foods increase activity.   HPI 78 y.o.female presents for 6 WEEK follow up for her sugars, iron, B12.  Wellbutrin was stopped and she was started on lexapro last visit.  Her losartan was cut in half due to dizziness from cardiology.  Her sugars are elevated, will discuss SGLT with the patient.   BMI is There is no height or weight on file to calculate BMI., she is working on diet and exercise. Wt Readings from Last 3 Encounters:  07/03/18 234 lb 3.2 oz (106.2 kg)  06/29/18 239 lb 9.6 oz (108.7 kg)  04/19/18 243 lb 6.4 oz (110.4 kg)   Lab Results  Component Value Date   HGBA1C 10.9 (H) 06/29/2018   Lab Results  Component Value Date   VITAMINB12 206 06/29/2018       Past Medical History:  Diagnosis Date  . Anemia    hx (04/14/2016)  . Anxiety   . Arthritis    "severe in my back; hands; ankles" (04/14/2016)  . Atypical chest pain 01/24/2018   Midline pain absent supine "constant" daytime since 12/31/17 > resolved as of 02/20/2018 on gerd/ gas diet   . Basal cell carcinoma    "several burned off; one cut off"  . CAD in native artery    a. NSTEMI 04/2016 - s/p DES toLAD and LCx. PCI to LCx notable for microembolization during cath.  . Chest pain- reslved with stopping Brilinta now on Plaix 04/16/2016  . Chronic lower back pain   . CKD (chronic kidney disease), stage III  ([place])   . Depression   . DJD (degenerative joint disease)   . Family history of adverse reaction to anesthesia    "half-sister used to get real sick" (04/14/2016)  . GERD (gastroesophageal reflux disease)   . History of gout   . Hyperlipidemia   . Hypertension   . IBS (irritable bowel syndrome)   . Ischemic cardiomyopathy    a. 04/2016: EF 40-50% by cath, 50-55% +WMA by echo.  . 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 ([place]) ~ 2015  . Morbid obesity ([place])   . NSTEMI (non-ST elevated myocardial infarction) ([place]) 04/14/2016  . OSA on CPAP   . Peripheral neuropathy   . Peripheral vascular disease ([place])   . RLS (restless legs syndrome)   . Spinal stenosis   . Type II diabetes mellitus ([place])   . Vitamin D deficiency      Allergies  Allergen Reactions  . Brilinta [Ticagrelor] Shortness Of Breath and Other (See Comments)    Chest pain (also)  . Aspirin Other (See Comments)    Can tolerate in small doses (is already on a blood thinner AND has kidney disease)  . Nsaids Other (See Comments)    Patient is taking a blood thinner and has kidney disease  . Minocycline Hcl Other (See Comments)    Welts  . Oruvail [Ketoprofen] Other (See Comments)  Has kidney disease and is taking a blood thinner  . Tricor [Fenofibrate]     Reaction unknown  . Vasotec [Enalaprilat]     Reaction unknown  . Zinc     Reaction unknown    Current Outpatient Medications on File Prior to Visit  Medication Sig  . acetaminophen (TYLENOL) 325 MG tablet Take 2 tablets (650 mg total) by mouth every 4 (four) hours as needed for headache or mild pain.  Marland Kitchen allopurinol (ZYLOPRIM) 100 MG tablet TAKE 1 TABLET DAILY TO PREVENT GOUT  . aspirin EC 81 MG EC tablet Take 1 tablet (81 mg total) by mouth daily.  Marland Kitchen atorvastatin (LIPITOR) 80 MG tablet TAKE 1 TABLET (80 MG TOTAL) BY MOUTH DAILY AT 6 PM.  . buPROPion (WELLBUTRIN XL) 300 MG 24 hr tablet TAKE 1 TABLET BY MOUTH EVERY DAY  . Cholecalciferol (CVS VIT D 5000  HIGH-POTENCY) 5000 units capsule Take 5,000 Units by mouth 2 (two) times daily.   . colchicine 0.6 MG tablet Take 2 tablets first day of gout flare, then one a day until gout flare is better  . diclofenac sodium (VOLTAREN) 1 % GEL Apply 4 g topically 4 (four) times daily.  Marland Kitchen escitalopram (LEXAPRO) 10 MG tablet Take 1 tablet (10 mg total) by mouth daily.  Marland Kitchen ezetimibe (ZETIA) 10 MG tablet TAKE 1 TABLET BY MOUTH EVERY DAY  . famotidine (PEPCID) 20 MG tablet One at bedtime  . ferrous sulfate 325 (65 FE) MG tablet Take 325 mg by mouth daily with breakfast.  . FLECTOR 1.3 % PTCH APPLY 1 PATCH EVERY 12 HOURS AS NEEDED THEN OFF 12 HOURS  . furosemide (LASIX) 40 MG tablet Take 40 mg by mouth as needed for fluid.  Marland Kitchen gabapentin (NEURONTIN) 100 MG capsule Take 1 to 2 capsules 3 to 4 x  /day for painful Diabetic Neuropathy.  . Insulin Glargine (BASAGLAR KWIKPEN) 100 UNIT/ML SOPN Inject 0.7 mLs (70 Units total) into the skin at bedtime.  . Insulin Pen Needle (BD PEN NEEDLE NANO U/F) 32G X 4 MM MISC USE 4 TIMES A DAY  . Lancets (ONETOUCH ULTRASOFT) lancets Use as instructed  . liraglutide (VICTOZA) 18 MG/3ML SOPN Inject 0.3 mLs (1.8 mg total) into the skin once a week for 12 doses.  Marland Kitchen losartan (COZAAR) 50 MG tablet Take 50 mg by mouth as directed. TAKE 1/2 TABLET BY MOUTH DAILY  . nitroGLYCERIN (NITROSTAT) 0.4 MG SL tablet Place 1 tablet (0.4 mg total) under the tongue every 5 (five) minutes x 3 doses as needed for chest pain.  Marland Kitchen NOVOLOG FLEXPEN 100 UNIT/ML FlexPen INJECT 16-20 UNITS INTO THE SKIN 3 (THREE) TIMES DAILY WITH MEALS.  Marland Kitchen ONETOUCH VERIO test strip USE AS INSTRUCTED TO CHECK SUGAR 2 TIMES DAILY.  Marland Kitchen OVER THE COUNTER MEDICATION   . pantoprazole (PROTONIX) 40 MG tablet Take 1 tablet (40 mg total) by mouth daily. Take 30-60 min before first meal of the day  . rOPINIRole (REQUIP) 3 MG tablet TAKE 1 TABLET BY MOUTH 4 TIMES A DAY  . tiZANidine (ZANAFLEX) 4 MG tablet TAKE 1/2 TO 1 TABLET 3 X /DAY AS NEEDED  FOR MUSCLE SPASM  . vitamin B-12 (CYANOCOBALAMIN) 1000 MCG tablet Take 1,000 mcg by mouth daily.   No current facility-administered medications on file prior to visit.     ROS: all negative except above.   Physical Exam: There were no vitals filed for this visit. There were no vitals taken for this visit. General appearance: alert, no  distress, WD/WN,  female HEENT: normocephalic, sclerae anicteric, TMs pearly, nares patent, no discharge or erythema, pharynx normal Oral cavity: MMM, no lesions Neck: supple, no lymphadenopathy, no thyromegaly, no masses Heart: RRR, normal S1, S2, no murmurs Lungs: CTA bilaterally, no wheezes, rhonchi, or rales Abdomen: +bs, soft, obese non tender, non distended, no masses, no hepatomegaly, no splenomegaly Musculoskeletal: nontender, no swelling, no obvious deformity Extremities: 1-2+ edema, no cyanosis, no clubbing Pulses: 2+ symmetric, upper and lower extremities, normal cap refill Neurological: alert, oriented x 3, CN2-12 intact, strength normal upper extremities and lower extremities, sensation decreased bilateral legs up to mid shin, DTRs 2+ throughout, no cerebellar signs, gait wide and antalgic Psychiatric: normal affect, behavior normal, pleasant     Vicie Mutters, PA-C 4:45 PM [facility]

## 2018-08-09 ENCOUNTER — Ambulatory Visit: Payer: Self-pay | Admitting: Physician Assistant

## 2018-08-10 ENCOUNTER — Other Ambulatory Visit: Payer: Self-pay

## 2018-08-10 NOTE — Patient Outreach (Signed)
[place] [facility]) Care Management  08/10/2018  Janet Choi 1940-08-19 [id number]    4th attempt to outreach the patient for initial assessment.  Female answered the phone and stated that Janet Choi was not at home.  HIPAA compliant voicemail left with contact information.   Plan: RN Health Coach will make outreach attempt to the patient within thirty business days.  Lazaro Arms RN, BSN, [place] Direct Dial:  [phone]  Fax: [phone]

## 2018-08-16 ENCOUNTER — Other Ambulatory Visit: Payer: Self-pay | Admitting: Internal Medicine

## 2018-08-17 ENCOUNTER — Other Ambulatory Visit: Payer: Self-pay

## 2018-08-17 ENCOUNTER — Other Ambulatory Visit: Payer: Self-pay | Admitting: Internal Medicine

## 2018-08-17 MED ORDER — BASAGLAR KWIKPEN 100 UNIT/ML ~~LOC~~ SOPN: 70.0000 [IU] | PEN_INJECTOR | Freq: Every day | SUBCUTANEOUS | 3 refills | Status: DC

## 2018-09-11 ENCOUNTER — Encounter: Payer: Self-pay | Admitting: Physician Assistant

## 2018-09-11 ENCOUNTER — Other Ambulatory Visit: Payer: Self-pay

## 2018-09-11 ENCOUNTER — Other Ambulatory Visit: Payer: Self-pay | Admitting: Physician Assistant

## 2018-09-11 ENCOUNTER — Telehealth: Payer: Self-pay | Admitting: *Deleted

## 2018-09-11 DIAGNOSIS — M1710 Unilateral primary osteoarthritis, unspecified knee: Secondary | ICD-10-CM | POA: Insufficient documentation

## 2018-09-11 DIAGNOSIS — M179 Osteoarthritis of knee, unspecified: Secondary | ICD-10-CM | POA: Insufficient documentation

## 2018-09-11 NOTE — Telephone Encounter (Signed)
A note was faxed to [facility] to inform them the Diclofenac 1 % Gel was approved by the patient's insurance company.

## 2018-09-11 NOTE — Patient Outreach (Signed)
[place] [facility]) Care Management  09/11/2018  Janet Choi [birth year] [id number]    5th outreach to the patient for initial assessment.  The patient answered the phone and was very receptive.  She stated that she could not speak with me today because she was not at home but did ask me to call her back.    Plan: RN Health Coach will make outreach attempt to the patient within thirty business days.  Lazaro Arms RN, BSN, [place] Direct Dial:  [phone]  Fax: [phone]

## 2018-09-12 ENCOUNTER — Other Ambulatory Visit: Payer: Self-pay

## 2018-09-12 NOTE — Patient Outreach (Signed)
[place] [facility]) Care Management  09/12/2018  EVONE ARSENEAU February 13, 1941 [id number]    2nd unsuccessful outreach attempt. No answer.  HIPAA compliant voicemail left with contact information.  Plan: RN Health Coach will make outreach attempt to the patient within thirty business days.  Lazaro Arms RN, BSN, [employer] [facility] Direct Dial:  [phone]  Fax: [phone]

## 2018-10-06 ENCOUNTER — Other Ambulatory Visit: Payer: Self-pay

## 2018-10-06 ENCOUNTER — Ambulatory Visit: Payer: PPO | Admitting: Physician Assistant

## 2018-10-06 DIAGNOSIS — J0100 Acute maxillary sinusitis, unspecified: Secondary | ICD-10-CM

## 2018-10-06 MED ORDER — DOXYCYCLINE HYCLATE 100 MG PO CAPS: ORAL_CAPSULE | ORAL | 0 refills | Status: DC

## 2018-10-06 MED ORDER — TRIAMCINOLONE ACETONIDE 55 MCG/ACT NA AERO: 2.0000 | INHALATION_SPRAY | Freq: Every day | NASAL | 3 refills | Status: DC

## 2018-10-06 NOTE — Progress Notes (Signed)
THIS ENCOUNTER IS A VIRTUAL VISIT DUE TO COVID-19 - PATIENT WAS NOT SEEN IN THE OFFICE.  PATIENT HAS CONSENTED TO VIRTUAL VISIT / TELEMEDICINE VISIT   Virtual Visit via telephone Note  I connected with [name] on 10/06/2018  by telephone.  I verified that I am speaking with the correct person using two identifiers.    I discussed the limitations of evaluation and management by telemedicine and the availability of in person appointments. The patient expressed understanding and agreed to proceed.  History of Present Illness: 78 y.o. obese WF with history of DM2 on insulin presents with sinus infection x 3-4 days.  She has been taking tylenol sinus which helps but the pain comes back. She has right ear, pain under right eye, sinus drainage/pressure/congestion. Teeth and gums hurt.  She has had chills but no fever. She has no SOB, cough.   She has not been in contact with anyone with COVID, has not left the house.    Lab Results  Component Value Date   GFRNONAA 38 (L) 06/29/2018    Medications  Current Outpatient Medications (Endocrine & Metabolic):  .  insulin aspart (NOVOLOG FLEXPEN) 100 UNIT/ML FlexPen, Inject 50  to 75 units 3 x /day before meals as directed for Diabetes .  Insulin Glargine (BASAGLAR KWIKPEN) 100 UNIT/ML SOPN, Inject 0.7 mLs (70 Units total) into the skin at bedtime. .  liraglutide (VICTOZA) 18 MG/3ML SOPN, Inject 0.3 mLs (1.8 mg total) into the skin once a week for 12 doses.  Current Outpatient Medications (Cardiovascular):  .  atorvastatin (LIPITOR) 80 MG tablet, TAKE 1 TABLET (80 MG TOTAL) BY MOUTH DAILY AT 6 PM. .  ezetimibe (ZETIA) 10 MG tablet, TAKE 1 TABLET BY MOUTH EVERY DAY .  furosemide (LASIX) 40 MG tablet, Take 40 mg by mouth as needed for fluid. Marland Kitchen  losartan (COZAAR) 50 MG tablet, Take 50 mg by mouth as directed. TAKE 1/2 TABLET BY MOUTH DAILY .  nitroGLYCERIN (NITROSTAT) 0.4 MG SL tablet, Place 1 tablet (0.4 mg total) under the tongue every 5  (five) minutes x 3 doses as needed for chest pain.   Current Outpatient Medications (Analgesics):  .  acetaminophen (TYLENOL) 325 MG tablet, Take 2 tablets (650 mg total) by mouth every 4 (four) hours as needed for headache or mild pain. Marland Kitchen  allopurinol (ZYLOPRIM) 100 MG tablet, TAKE 1 TABLET DAILY TO PREVENT GOUT .  aspirin EC 81 MG EC tablet, Take 1 tablet (81 mg total) by mouth daily. .  colchicine 0.6 MG tablet, Take 2 tablets first day of gout flare, then one a day until gout flare is better  Current Outpatient Medications (Hematological):  .  ferrous sulfate 325 (65 FE) MG tablet, Take 325 mg by mouth daily with breakfast. .  vitamin B-12 (CYANOCOBALAMIN) 1000 MCG tablet, Take 1,000 mcg by mouth daily.  Current Outpatient Medications (Other):  Marland Kitchen  buPROPion (WELLBUTRIN XL) 300 MG 24 hr tablet, TAKE 1 TABLET BY MOUTH EVERY DAY .  Cholecalciferol (CVS VIT D 5000 HIGH-POTENCY) 5000 units capsule, Take 5,000 Units by mouth 2 (two) times daily.  .  diclofenac sodium (VOLTAREN) 1 % GEL, Apply 4 g topically 4 (four) times daily. Marland Kitchen  escitalopram (LEXAPRO) 10 MG tablet, Take 1 tablet (10 mg total) by mouth daily. .  famotidine (PEPCID) 20 MG tablet, One at bedtime .  FLECTOR 1.3 % PTCH, APPLY 1 PATCH EVERY 12 HOURS AS NEEDED THEN OFF 12 HOURS .  gabapentin (NEURONTIN) 100 MG  capsule, Take 1 to 2 capsules 3 to 4 x  /day for painful Diabetic Neuropathy. .  Insulin Pen Needle (BD PEN NEEDLE NANO U/F) 32G X 4 MM MISC, USE 4 TIMES A DAY .  Lancets (ONETOUCH ULTRASOFT) lancets, Use as instructed .  ONETOUCH VERIO test strip, USE AS INSTRUCTED TO CHECK SUGAR 2 TIMES DAILY. Marland Kitchen  OVER THE COUNTER MEDICATION,  .  pantoprazole (PROTONIX) 40 MG tablet, Take 1 tablet (40 mg total) by mouth daily. Take 30-60 min before first meal of the day .  rOPINIRole (REQUIP) 3 MG tablet, TAKE 1 TABLET BY MOUTH FOUR TIMES A DAY .  tiZANidine (ZANAFLEX) 4 MG tablet, TAKE 1/2 TO 1 TABLET 3 X /DAY AS NEEDED FOR MUSCLE  SPASM  Problem list She has Dyslipidemia; Essential hypertension; Diverticulosis of large intestine; History of colonic polyps; Morbid obesity due to excess calories ([place]); Vitamin D deficiency; Medication management; RLS (restless legs syndrome); Type 2 diabetes mellitus with hyperlipidemia ([place]); OSA on CPAP; Iron deficiency anemia; History of non-ST elevation myocardial infarction (NSTEMI); CKD (chronic kidney disease), stage III ([place]); CAD S/P percutaneous coronary angioplasty; DOE (dyspnea on exertion); Anxiety; DJD (degenerative joint disease), lumbosacral; Upper airway cough syndrome; Diabetic polyneuropathy associated with type 2 diabetes mellitus ([place]); and DJD (degenerative joint disease) of knee on their problem list.   Observations/Objective: General Appearance:Well sounding, in no apparent distress.  ENT/Mouth: No hoarseness, No cough for duration of visit.  Respiratory: completing full sentences without distress, without audible wheeze Neuro: Awake and oriented X 3,  Psych:  Insight and Judgment appropriate.   Assessment and Plan: Diagnoses and all orders for this visit:  Acute non-recurrent maxillary sinusitis -     triamcinolone (NASACORT) 55 MCG/ACT AERO nasal inhaler; Place 2 sprays into the nose at bedtime. -     doxycycline (VIBRAMYCIN) 100 MG capsule; Take 1 capsule twice daily with food   Nasal saline spray for congestion. Nasal steroids, allergy pill  Follow up as needed.  Future Appointments  Date Time Provider [place]  10/06/2018 10:00 AM Vicie Mutters, PA-C [facility] None  10/10/2018 11:15 AM Lazaro Arms, RN [facility] None  07/13/2019 10:30 AM Vicie Mutters, PA-C [facility] None    Follow Up Instructions:  I discussed the assessment and treatment plan with the patient. The patient was provided an opportunity to ask questions and all were answered. The patient agreed with the plan and demonstrated an understanding of the instructions.   The  patient was advised to call back or seek an in-person evaluation if the symptoms worsen or if the condition fails to improve as anticipated.  I provided 15 minutes of non-face-to-face time during this encounter.   Vicie Mutters, PA-C

## 2018-10-10 ENCOUNTER — Other Ambulatory Visit: Payer: Self-pay

## 2018-10-10 NOTE — Patient Outreach (Signed)
[facility]) Care Management  10/10/2018  Janet Choi 1940-07-29 [id number]    7th attempt to outreach the patient for initial assessment. No answer.  Unable to leave a message due to no voicemail pickup.  Plan:  RN Health Coach will outreach the patient within 36 business day.  Lazaro Arms RN, BSN, [place] Direct Dial:  [phone]  Fax: [phone]

## 2018-10-18 ENCOUNTER — Other Ambulatory Visit: Payer: Self-pay | Admitting: *Deleted

## 2018-10-18 ENCOUNTER — Other Ambulatory Visit: Payer: Self-pay | Admitting: Internal Medicine

## 2018-10-18 DIAGNOSIS — E1142 Type 2 diabetes mellitus with diabetic polyneuropathy: Secondary | ICD-10-CM

## 2018-10-18 MED ORDER — GABAPENTIN 100 MG PO CAPS: ORAL_CAPSULE | ORAL | 1 refills | Status: DC

## 2018-11-02 ENCOUNTER — Other Ambulatory Visit: Payer: Self-pay

## 2018-11-02 NOTE — Patient Outreach (Signed)
[facility]) Care Management  11/02/2018  MEA OZGA Sep 23, 1940 [id number]    RN Health Coach closing the program.  Patient is transitioning to external program [facility] for continued case management.  Lazaro Arms RN, BSN, [place] Direct Dial:  [phone]  Fax: [phone]

## 2018-12-09 ENCOUNTER — Other Ambulatory Visit: Payer: Self-pay | Admitting: Physician Assistant

## 2018-12-11 ENCOUNTER — Ambulatory Visit: Payer: Self-pay

## 2018-12-13 ENCOUNTER — Ambulatory Visit: Payer: PPO | Admitting: Physician Assistant

## 2018-12-13 ENCOUNTER — Ambulatory Visit: Payer: PPO | Admitting: Adult Health

## 2018-12-13 ENCOUNTER — Other Ambulatory Visit: Payer: Self-pay

## 2018-12-13 ENCOUNTER — Encounter: Payer: Self-pay | Admitting: Physician Assistant

## 2018-12-13 DIAGNOSIS — Z79899 Other long term (current) drug therapy: Secondary | ICD-10-CM

## 2018-12-13 DIAGNOSIS — E785 Hyperlipidemia, unspecified: Secondary | ICD-10-CM

## 2018-12-13 DIAGNOSIS — I10 Essential (primary) hypertension: Secondary | ICD-10-CM | POA: Diagnosis not present

## 2018-12-13 DIAGNOSIS — E1142 Type 2 diabetes mellitus with diabetic polyneuropathy: Secondary | ICD-10-CM | POA: Diagnosis not present

## 2018-12-13 DIAGNOSIS — R0609 Other forms of dyspnea: Secondary | ICD-10-CM | POA: Diagnosis not present

## 2018-12-13 DIAGNOSIS — N183 Chronic kidney disease, stage 3 unspecified: Secondary | ICD-10-CM

## 2018-12-13 DIAGNOSIS — Z9861 Coronary angioplasty status: Secondary | ICD-10-CM

## 2018-12-13 DIAGNOSIS — E1169 Type 2 diabetes mellitus with other specified complication: Secondary | ICD-10-CM | POA: Diagnosis not present

## 2018-12-13 DIAGNOSIS — I2510 Atherosclerotic heart disease of native coronary artery without angina pectoris: Secondary | ICD-10-CM

## 2018-12-13 MED ORDER — ESCITALOPRAM OXALATE 10 MG PO TABS: 10.0000 mg | ORAL_TABLET | Freq: Every day | ORAL | 3 refills | Status: DC

## 2018-12-13 NOTE — Patient Instructions (Addendum)
Go to testing site to get tested for COVID If you are negative come in for lab only in the office.   Do the wellbutrin 1/2 pill x 2 weeks while on the lexapro 10mg  daily Then stop the wellbutrin to see if this helps your anxiety  If you get nausea and HA after the first week please stop If you get anxious or snappy with people than stop the medication   The testing sites are open from 8-4, Monday-Friday. Due to the testing being walk-up/drive-up the sites request that the pt's are in line to have testing by 3:30 pm. The pt's will remain in the car and wear a mask when going for testing.The staff will come to the car to perform testing. The pt's will need to bring an ID and insurance card if they have one.   [place] ([place])  [facility]   The testing sites are also listed on the [employer] as well.   Go to the ER if any chest pain, shortness of breath, nausea, dizziness, severe HA, changes vision/speech

## 2018-12-13 NOTE — Progress Notes (Signed)
ACUTE VISIT AND FOLLOW UP THIS ENCOUNTER IS A VIRTUAL/TELEPHONE VISIT DUE TO COVID-19 - PATIENT WAS NOT SEEN IN THE OFFICE.  PATIENT HAS CONSENTED TO VIRTUAL VISIT / TELEMEDICINE VISIT  This provider placed a call to Janet Choi using telephone, her appointment was changed to a virtual office visit to reduce the risk of exposure to the COVID-19 virus and to help BENTLEIGH WAREN remain healthy and safe. The virtual visit will also provide continuity of care. She verbalizes understanding.    Assessment:  Depression Stop wellbutrin, ? If causing anxiety/symptoms, start on lexapro as she was suppose to do last visit  SOB Weight is up, take lasix monitor weight daily Get tested for COVID, if negative schedule OV for follow up and labs Go to the ER if any chest pain, shortness of breath, nausea, dizziness, severe HA, changes vision/speech  Essential hypertension - continue medications, DASH diet, exercise and monitor at home. Call if greater than 130/80.   Controlled type 2 diabetes mellitus with stage 3 chronic kidney disease, without long-term current use of insulin ([place]) Discussed general issues about diabetes pathophysiology and management., Educational material distributed., Suggested low cholesterol diet., Encouraged aerobic exercise., Discussed foot care., Reminded to get yearly retinal exam.  Uncontrolled type 2 diabetes mellitus with stage 3 chronic kidney disease, with long-term current use of insulin ([place]) Discussed general issues about diabetes pathophysiology and management., Educational material distributed., Suggested low cholesterol diet., Encouraged aerobic exercise., Discussed foot care., Reminded to get yearly retinal exam.  CKD (chronic kidney disease), stage III Increase fluids, avoid NSAIDS, monitor sugars, will monitor - may need referral nephrology  Hyperlipidemia -continue medications, check lipids, decrease fatty foods, increase activity.   Morbid Obesity with co  morbidities - long discussion about weight loss, diet, and exercise  B12 Continue B12, check labs, if not at goal will start on shots.   Morbid obesity - follow up 3 months for progress monitoring - increase veggies, decrease carbs - long discussion about weight loss, diet, and exercise  Diabetic polyneuropathy associated with type 2 diabetes mellitus ([place]) Discussed disease progression and risks Discussed diet/exercise, weight management and risk modification  Type 2 diabetes mellitus with hyperlipidemia (H[facility]) check lipids decrease fatty foods increase activity.     Future Appointments  Date Time Provider [place]  07/13/2019 10:30 AM Vicie Mutters, PA-C [facility] None     Subjective:   Janet Choi is a 78 y.o. female who presents for 3 month follow up on hypertension,diabetes, hyperlipidemia, vitamin D def, poor compliance.   She states she has a lot of stress, beach house at [employer], nephew had baby had to go to ICU, having financial problems and she feels very stressed. Sister at the beach with dementia, niece wants her to watch her sister however she states she is not able to. She was at the beach not long ago.  She states she has tightness in her chest with the stress, shortness of breath, she has gained weight, 8 lbs according to her scale, She has been doing deep breathing every day that makes her feel better. She has a cough with right nasal drainage.   She has still been on the wellbutrin and never started lexapro.  She is on lasix 4/7 days a week, can only take 2-3 days at a time before she gets bad leg cramps.  No fever, chills.  States no swelling in legs today.   Her blood pressure has been controlled at home, she is on  verapamil, lasix, today [name] is   She does not workout. She denies chest pain, shortness of breath, dizziness.  Lab Results  Component Value Date   VITAMINB12 206 06/29/2018   She had NSTEMI with PCI LAD and LCx 04/2016, on  plavix follows Cardio, Dr. Oval Linsey, on ASA.  BMI is There is no height or weight on file to calculate BMI., she is working on diet and exercise. Wt Readings from Last 3 Encounters:  07/03/18 234 lb 3.2 oz (106.2 kg)  06/29/18 239 lb 9.6 oz (108.7 kg)  04/19/18 243 lb 6.4 oz (110.4 kg)    She is on cholesterol medication, lipitor and zetia denies myalgias. Her cholesterol is at goal. The cholesterol last visit was:   Lab Results  Component Value Date   CHOL 120 06/29/2018   HDL 43 (L) 06/29/2018   LDLCALC 49 06/29/2018   TRIG 216 (H) 06/29/2018   CHOLHDL 2.8 06/29/2018   She has been working on diet and exercise for diabetes CKD stage 3 on ACE/ARB Polyneuropathy on gabapentin Hyperlipidemia on lipitor and zetia.  CAD s/p PCI LAB and LCx 04/2016, she is on bASA/plavix, follows Dr. Oval Linsey she is on basaglar 70 units at night,   novolog 16-20 units with meals She is not checking her sugars still.  She will take her morning insulin but will forget to take her dinner time sugar, she does not eat lunch and does not take any insulin at lunch.  She has one touch ultra.  She is following Dr. Darnell Level,  denies hypoglycemia , polydipsia, polyuria and visual disturbances.  Last A1C in the office was:  Lab Results  Component Value Date   HGBA1C 10.9 (H) 06/29/2018   Lab Results  Component Value Date   CREATININE 1.35 (H) 06/29/2018   BUN 29 (H) 06/29/2018   NA 139 06/29/2018   K 4.4 06/29/2018   CL 100 06/29/2018   CO2 24 06/29/2018   Last GFR, she does not see nephrology.  Lab Results  Component Value Date   GFRNONAA 38 (L) 06/29/2018   Patient is on Vitamin D supplement. Lab Results  Component Value Date   VD25OH 69 04/12/2018   Dr. Berenice Primas for her lower back pain left radicular pain and left arm, had ESI this past Thursday with Dr. Mina Marble, helping some.   Medication Review  Current Outpatient Medications (Endocrine & Metabolic):  .  insulin aspart (NOVOLOG FLEXPEN) 100 UNIT/ML  FlexPen, Inject 50  to 75 units 3 x /day before meals as directed for Diabetes .  Insulin Glargine (BASAGLAR KWIKPEN) 100 UNIT/ML SOPN, INJECT 70 UNITS TOTAL INTO THE SKIN AT BEDTIME. Marland Kitchen  liraglutide (VICTOZA) 18 MG/3ML SOPN, Inject 0.3 mLs (1.8 mg total) into the skin once a week for 12 doses.  Current Outpatient Medications (Cardiovascular):  .  atorvastatin (LIPITOR) 80 MG tablet, TAKE 1 TABLET (80 MG TOTAL) BY MOUTH DAILY AT 6 PM. .  furosemide (LASIX) 40 MG tablet, Take 40 mg by mouth as needed for fluid. Marland Kitchen  losartan (COZAAR) 50 MG tablet, Take 50 mg by mouth as directed. TAKE 1/2 TABLET BY MOUTH DAILY .  nitroGLYCERIN (NITROSTAT) 0.4 MG SL tablet, Place 1 tablet (0.4 mg total) under the tongue every 5 (five) minutes x 3 doses as needed for chest pain.   Current Outpatient Medications (Analgesics):  .  acetaminophen (TYLENOL) 325 MG tablet, Take 2 tablets (650 mg total) by mouth every 4 (four) hours as needed for headache or mild pain. Marland Kitchen  allopurinol (ZYLOPRIM) 100 MG tablet, TAKE 1 TABLET DAILY TO PREVENT GOUT .  aspirin EC 81 MG EC tablet, Take 1 tablet (81 mg total) by mouth daily. .  colchicine 0.6 MG tablet, Take 2 tablets first day of gout flare, then one a day until gout flare is better  Current Outpatient Medications (Hematological):  .  ferrous sulfate 325 (65 FE) MG tablet, Take 325 mg by mouth daily with breakfast. .  vitamin B-12 (CYANOCOBALAMIN) 1000 MCG tablet, Take 1,000 mcg by mouth daily.  Current Outpatient Medications (Other):  Marland Kitchen  Cholecalciferol (CVS VIT D 5000 HIGH-POTENCY) 5000 units capsule, Take 5,000 Units by mouth 2 (two) times daily.  .  diclofenac sodium (VOLTAREN) 1 % GEL, Apply 4 g topically 4 (four) times daily. .  famotidine (PEPCID) 20 MG tablet, One at bedtime .  FLECTOR 1.3 % PTCH, APPLY 1 PATCH EVERY 12 HOURS AS NEEDED THEN OFF 12 HOURS .  gabapentin (NEURONTIN) 100 MG capsule, TAKE 1 TO 2 CAPSULES 3 TO 4 X /DAY FOR PAINFUL DIABETIC NEUROPATHY. .   Insulin Pen Needle (BD PEN NEEDLE NANO U/F) 32G X 4 MM MISC, USE 4 TIMES A DAY .  Lancets (ONETOUCH ULTRASOFT) lancets, Use as instructed .  ONETOUCH VERIO test strip, USE AS INSTRUCTED TO CHECK SUGAR 2 TIMES DAILY. Marland Kitchen  OVER THE COUNTER MEDICATION,  .  rOPINIRole (REQUIP) 3 MG tablet, TAKE 1 TABLET BY MOUTH FOUR TIMES A DAY .  tiZANidine (ZANAFLEX) 4 MG tablet, TAKE 1/2 TO 1 TABLET 3 X /DAY AS NEEDED FOR MUSCLE SPASM .  escitalopram (LEXAPRO) 10 MG tablet, Take 1 tablet (10 mg total) by mouth daily.  Current Problems (verified) Patient Active Problem List   Diagnosis Date Noted  . DJD (degenerative joint disease) of knee 09/11/2018  . Diabetic polyneuropathy associated with type 2 diabetes mellitus ([place]) 06/27/2018  . Upper airway cough syndrome 01/24/2018  . DJD (degenerative joint disease), lumbosacral 06/02/2017  . DOE (dyspnea on exertion) 04/19/2016  . Anxiety 04/19/2016  . CAD S/P percutaneous coronary angioplasty   . History of non-ST elevation myocardial infarction (NSTEMI) 04/14/2016  . CKD (chronic kidney disease), stage III ([place]) 04/14/2016  . Iron deficiency anemia 08/15/2015  . OSA on CPAP 04/16/2015  . RLS (restless legs syndrome) 03/20/2014  . Type 2 diabetes mellitus with hyperlipidemia ([place]) 03/20/2014  . Medication management 09/12/2013  . Morbid obesity due to excess calories ([place]) 05/14/2013  . Vitamin D deficiency 05/14/2013  . Dyslipidemia 02/24/2009  . Essential hypertension 02/24/2009  . Diverticulosis of large intestine 02/24/2009  . History of colonic polyps 02/24/2009    Allergies Allergies  Allergen Reactions  . Brilinta [Ticagrelor] Shortness Of Breath and Other (See Comments)    Chest pain (also)  . Aspirin Other (See Comments)    Can tolerate in small doses (is already on a blood thinner AND has kidney disease)  . Nsaids Other (See Comments)    Patient is taking a blood thinner and has kidney disease  . Minocycline Hcl Other (See Comments)     Welts  . Oruvail [Ketoprofen] Other (See Comments)    Has kidney disease and is taking a blood thinner  . Tricor [Fenofibrate]     Reaction unknown  . Vasotec [Enalaprilat]     Reaction unknown  . Zinc     Reaction unknown    SURGICAL HISTORY She  has a past surgical history that includes Total knee arthroplasty (Bilateral); Lumbar disc surgery (X 2); Appendectomy; Joint replacement;  Back surgery; Melanoma excision (Left); Excision basal cell carcinoma (Left); Shoulder open rotator cuff repair (Right); Shoulder arthroscopy w/ rotator cuff repair (Left); Cataract extraction, bilateral (Bilateral); Cardiac catheterization (N/A, 04/15/2016); and Cardiac catheterization (N/A, 04/15/2016). FAMILY HISTORY Her family history includes AAA (abdominal aortic aneurysm) in her father; Heart disease in her mother and son; Kidney disease in her father. SOCIAL HISTORY She  reports that she quit smoking about 35 years ago. Her smoking use included cigarettes. She has a 62.50 pack-year smoking history. She has quit using smokeless tobacco. She reports that she does not drink alcohol or use drugs.   Objective:   There were no vitals taken for this visit. There is no height or weight on file to calculate BMI.  General Appearance:Well sounding, in no apparent distress.  ENT/Mouth: No hoarseness, No cough for duration of visit.  Respiratory: completing full sentences without distress, without audible wheeze Neuro: Awake and oriented X 3,  Psych:  Insight and Judgment appropriate.     Vicie Mutters, PA-C   12/13/2018

## 2018-12-14 ENCOUNTER — Other Ambulatory Visit: Payer: Self-pay

## 2018-12-14 DIAGNOSIS — Z20822 Contact with and (suspected) exposure to covid-19: Secondary | ICD-10-CM

## 2018-12-16 LAB — SPECIMEN STATUS REPORT

## 2018-12-16 LAB — NOVEL CORONAVIRUS, NAA: SARS-CoV-2, NAA: NOT DETECTED

## 2019-01-01 ENCOUNTER — Other Ambulatory Visit: Payer: Self-pay | Admitting: Physician Assistant

## 2019-01-01 ENCOUNTER — Other Ambulatory Visit: Payer: Self-pay | Admitting: Internal Medicine

## 2019-01-01 DIAGNOSIS — E1142 Type 2 diabetes mellitus with diabetic polyneuropathy: Secondary | ICD-10-CM

## 2019-01-23 ENCOUNTER — Other Ambulatory Visit: Payer: Self-pay | Admitting: Internal Medicine

## 2019-02-03 ENCOUNTER — Other Ambulatory Visit: Payer: Self-pay | Admitting: Internal Medicine

## 2019-02-05 NOTE — Telephone Encounter (Signed)
Last office visit 06/02/2017   Cancel/No-show? 2  Future office visit scheduled? none

## 2019-02-05 NOTE — Telephone Encounter (Signed)
Refills per PCP - she was lost for f/u for me.

## 2019-02-08 ENCOUNTER — Other Ambulatory Visit: Payer: Self-pay | Admitting: Internal Medicine

## 2019-02-17 ENCOUNTER — Other Ambulatory Visit: Payer: Self-pay

## 2019-02-17 ENCOUNTER — Ambulatory Visit
Admission: EM | Admit: 2019-02-17 | Discharge: 2019-02-17 | Disposition: A | Discharge status: Home / Self Care | Payer: PPO | Attending: Emergency Medicine | Admitting: Emergency Medicine

## 2019-02-17 DIAGNOSIS — L03115 Cellulitis of right lower limb: Secondary | ICD-10-CM | POA: Diagnosis not present

## 2019-02-17 DIAGNOSIS — I10 Essential (primary) hypertension: Secondary | ICD-10-CM

## 2019-02-17 DIAGNOSIS — Z794 Long term (current) use of insulin: Secondary | ICD-10-CM | POA: Diagnosis not present

## 2019-02-17 DIAGNOSIS — E1140 Type 2 diabetes mellitus with diabetic neuropathy, unspecified: Secondary | ICD-10-CM | POA: Diagnosis not present

## 2019-02-17 MED ORDER — SULFAMETHOXAZOLE-TRIMETHOPRIM 800-160 MG PO TABS: 1.0000 | ORAL_TABLET | Freq: Two times a day (BID) | ORAL | 0 refills | Status: AC

## 2019-02-17 NOTE — ED Provider Notes (Signed)
[facility]    CSN: [id number] Arrival date & time: 02/17/19  0807      History   Chief Complaint No chief complaint on file.   HPI Janet Choi is a 78 y.o. female with history of diabetes, lower extremity diabetic neuropathy, CKD, hypertension presenting for right lower extremity redness, pain, warmth since Tuesday.  States it started around the back of her leg now has come around towards the front.  Patient denies pain with ambulation, fever.  Has use compression stockings, ice with minimal relief.  Denies known trauma, bug bite, open wound.   Past Medical History:  Diagnosis Date  . Anemia    hx (04/14/2016)  . Anxiety   . Arthritis    "severe in my back; hands; ankles" (04/14/2016)  . Atypical chest pain 01/24/2018   Midline pain absent supine "constant" daytime since 12/31/17 > resolved as of 02/20/2018 on gerd/ gas diet   . Basal cell carcinoma    "several burned off; one cut off"  . CAD in native artery    a. NSTEMI 04/2016 - s/p DES toLAD and LCx. PCI to LCx notable for microembolization during cath.  . Chest pain- reslved with stopping Brilinta now on Plaix 04/16/2016  . Chronic lower back pain   . CKD (chronic kidney disease), stage III   . Depression   . DJD (degenerative joint disease)   . Family history of adverse reaction to anesthesia    "half-sister used to get real sick" (04/14/2016)  . GERD (gastroesophageal reflux disease)   . History of gout   . Hyperlipidemia   . Hypertension   . IBS (irritable bowel syndrome)   . Ischemic cardiomyopathy    a. 04/2016: EF 40-50% by cath, 50-55% +WMA by echo.  . 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 ([place]) ~ 2015  . Morbid obesity ([place])   . NSTEMI (non-ST elevated myocardial infarction) ([place]) 04/14/2016  . OSA on CPAP   . Peripheral neuropathy   . Peripheral vascular disease ([place])   . RLS (restless legs syndrome)   . Spinal stenosis   . Type II diabetes mellitus ([place])   . Vitamin D deficiency      Patient Active Problem List   Diagnosis Date Noted  . DJD (degenerative joint disease) of knee 09/11/2018  . Diabetic polyneuropathy associated with type 2 diabetes mellitus ([place]) 06/27/2018  . Upper airway cough syndrome 01/24/2018  . DJD (degenerative joint disease), lumbosacral 06/02/2017  . DOE (dyspnea on exertion) 04/19/2016  . Anxiety 04/19/2016  . CAD S/P percutaneous coronary angioplasty   . History of non-ST elevation myocardial infarction (NSTEMI) 04/14/2016  . CKD (chronic kidney disease), stage III ([place]) 04/14/2016  . Iron deficiency anemia 08/15/2015  . OSA on CPAP 04/16/2015  . RLS (restless legs syndrome) 03/20/2014  . Type 2 diabetes mellitus with hyperlipidemia ([place]) 03/20/2014  . Medication management 09/12/2013  . Morbid obesity due to excess calories ([place]) 05/14/2013  . Vitamin D deficiency 05/14/2013  . Dyslipidemia 02/24/2009  . Essential hypertension 02/24/2009  . Diverticulosis of large intestine 02/24/2009  . History of colonic polyps 02/24/2009    Past Surgical History:  Procedure Laterality Date  . APPENDECTOMY    . BACK SURGERY    . BASAL CELL CARCINOMA EXCISION Left    leg  . CARDIAC CATHETERIZATION N/A 04/15/2016   Procedure: Left Heart Cath and Coronary Angiography;  Surgeon: Belva Crome, MD;  Location: [facility];  Service: Cardiovascular;  Laterality:  N/A;  . CARDIAC CATHETERIZATION N/A 04/15/2016   Procedure: Coronary Stent Intervention;  Surgeon: Belva Crome, MD;  Location: [facility];  Service: Cardiovascular;  Laterality: N/A;  Mid LAD Mid CFX  . CATARACT EXTRACTION, BILATERAL Bilateral   . JOINT REPLACEMENT    . LUMBAR DISC SURGERY  X 2  . MELANOMA EXCISION Left    "towards the back of my neck"  . SHOULDER ARTHROSCOPY W/ ROTATOR CUFF REPAIR Left   . SHOULDER OPEN ROTATOR CUFF REPAIR Right   . TOTAL KNEE ARTHROPLASTY Bilateral     OB History   No obstetric history on file.      Home Medications    Prior to  Admission medications   Medication Sig Start Date End Date Taking? Authorizing Provider  acetaminophen (TYLENOL) 325 MG tablet Take 2 tablets (650 mg total) by mouth every 4 (four) hours as needed for headache or mild pain. 04/20/16   Isaiah Serge, NP  allopurinol (ZYLOPRIM) 100 MG tablet TAKE 1 TABLET DAILY TO PREVENT GOUT 02/02/18   [name], MD  aspirin EC 81 MG EC tablet Take 1 tablet (81 mg total) by mouth daily. 04/21/16   Isaiah Serge, NP  atorvastatin (LIPITOR) 80 MG tablet TAKE 1 TABLET (80 MG TOTAL) BY MOUTH DAILY AT 6 PM. 07/12/18   Skeet Latch, MD  Cholecalciferol ([employer] VIT D 5000 HIGH-POTENCY) 5000 units capsule Take 5,000 Units by mouth 2 (two) times daily.     [provider]  colchicine 0.6 MG tablet Take 2 tablets first day of gout flare, then one a day until gout flare is better 01/20/17 07/03/18  [name], MD  diclofenac sodium (VOLTAREN) 1 % GEL Apply 4 g topically 4 (four) times daily. 01/18/18   Vicie Mutters, PA-C  escitalopram (LEXAPRO) 10 MG tablet Take 1 tablet (10 mg total) by mouth daily. 12/13/18 12/13/19  Vicie Mutters, PA-C  famotidine (PEPCID) 20 MG tablet One at bedtime 01/23/18   Tanda Rockers, MD  ferrous sulfate 325 (65 FE) MG tablet Take 325 mg by mouth daily with breakfast.    [provider]  FLECTOR 1.3 % PTCH APPLY 1 PATCH EVERY 12 HOURS AS NEEDED THEN OFF 12 HOURS 06/22/17   [name], MD  furosemide (LASIX) 40 MG tablet Take 40 mg by mouth as needed for fluid.    [provider]  gabapentin (NEURONTIN) 100 MG capsule Take 1 to 2 capsules 3 to 4 x /day as needed for Painful Diabetic Neuropathy 01/01/19   [name], MD  insulin aspart (NOVOLOG FLEXPEN) 100 UNIT/ML FlexPen Inject 50  to 75 units 3 x /day before meals as directed for Diabetes 08/17/18   [name], MD  Insulin Glargine [facility]) 100 UNIT/ML SOPN INJECT 70 UNITS TOTAL INTO THE SKIN AT BEDTIME. 12/09/18   [name], MD   Insulin Pen Needle (BD PEN NEEDLE NANO U/F) 32G X 4 MM MISC USE 4 TIMES A DAY 12/19/17   Philemon Kingdom, MD  Lancets [facility] ULTRASOFT) lancets Use as instructed 04/27/16   Philemon Kingdom, MD  liraglutide (VICTOZA) 18 MG/3ML SOPN Inject 0.3 mLs (1.8 mg total) into the skin once a week for 12 doses. 10/04/17 01/23/18  [name], MD  losartan (COZAAR) 50 MG tablet Take 50 mg by mouth as directed. TAKE 1/2 TABLET BY MOUTH DAILY    [provider]  nitroGLYCERIN (NITROSTAT) 0.4 MG SL tablet Place 1 tablet (0.4 mg total) under the tongue every  5 (five) minutes x 3 doses as needed for chest pain. 10/04/17   [name], MD  ONETOUCH VERIO test strip USE AS INSTRUCTED TO CHECK SUGAR 2 TIMES DAILY. 09/21/17   Philemon Kingdom, MD  OVER THE COUNTER MEDICATION     [provider]  rOPINIRole (REQUIP) 3 MG tablet TAKE 1 TABLET BY MOUTH FOUR TIMES A DAY 01/01/19   Vicie Mutters, PA-C  sulfamethoxazole-trimethoprim (BACTRIM DS) 800-160 MG tablet Take 1 tablet by mouth 2 (two) times daily for 7 days. 02/17/19 02/24/19  Hall-Potvin, [place], PA-C  tiZANidine (ZANAFLEX) 4 MG tablet Take 1/2 to 1 tablet 2 to 3 x /day as needed for Muscle Spasms 01/23/19   [name], MD  vitamin B-12 (CYANOCOBALAMIN) 1000 MCG tablet Take 1,000 mcg by mouth daily.    [provider]    Family History Family History  Problem Relation Age of Onset  . Heart disease Mother   . Kidney disease Father   . AAA (abdominal aortic aneurysm) Father   . Heart disease Son   . Colon cancer Neg Hx   . Colon polyps Neg Hx   . Esophageal cancer Neg Hx   . Pancreatic cancer Neg Hx   . Stomach cancer Neg Hx   . Liver disease Neg Hx   . Diabetes Neg Hx     Social History Social History   Tobacco Use  . Smoking status: Former Smoker    Packs/day: 2.50    Years: 25.00    Pack years: 62.50    Types: Cigarettes    Quit date: 11/11/1983    Years since quitting: 35.2  . Smokeless tobacco:  Former Network engineer Use Topics  . Alcohol use: No    Alcohol/week: 0.0 standard drinks  . Drug use: No     Allergies   Brilinta [ticagrelor], Aspirin, Nsaids, Minocycline hcl, Oruvail [ketoprofen], Tricor [fenofibrate], Vasotec [enalaprilat], and Zinc   Review of Systems Review of Systems  Constitutional: Negative for fatigue and fever.  HENT: Negative for ear pain, sinus pain, sore throat and voice change.   Eyes: Negative for pain, redness and visual disturbance.  Respiratory: Negative for cough and shortness of breath.   Cardiovascular: Negative for chest pain and palpitations.  Gastrointestinal: Negative for abdominal pain, diarrhea and vomiting.  Musculoskeletal: Negative for arthralgias, gait problem and myalgias.  Skin: Positive for rash. Negative for wound.  Neurological: Negative for syncope and headaches.     Physical Exam Triage Vital Signs ED Triage Vitals  Enc Vitals Group     BP      Pulse      Resp      Temp      Temp src      SpO2      Weight      Height      Head Circumference      Peak Flow      Pain Score      Pain Loc      Pain Edu?      Excl. in [place]?    No data found.  Updated Vital Signs BP (!) 132/93 (BP Location: Left Arm)   Pulse 80   Temp 98.9 F (37.2 C) (Oral)   Resp 16   SpO2 94%   Visual Acuity Right Eye Distance:   Left Eye Distance:   Bilateral Distance:    Right Eye Near:   Left Eye Near:    Bilateral Near:     Physical Exam Constitutional:  General: She is not in acute distress. HENT:     Head: Normocephalic and atraumatic.  Eyes:     General: No scleral icterus.    Pupils: Pupils are equal, round, and reactive to light.  Cardiovascular:     Rate and Rhythm: Normal rate.  Pulmonary:     Effort: Pulmonary effort is normal.  Musculoskeletal: Normal range of motion.        General: Tenderness present.  Skin:    Capillary Refill: Capillary refill takes less than 2 seconds.     Comments: Right lower leg  with nearly circumferential erythema, sparing most medial aspect.  Leg mildly edematous as compared to right, though no pitting edema noted.  Exquisitely tender to palpation with warmth.  No fluctuance, induration, open wound visible.  No streaking up or down leg.  Neurological:     Mental Status: She is alert and oriented to person, place, and time.      UC Treatments / Results  Labs (all labs ordered are listed, but only abnormal results are displayed) Labs Reviewed - No data to display  EKG   Radiology No results found.  Procedures Procedures (including critical care time)  Medications Ordered in UC Medications - No data to display  Initial Impression / Assessment and Plan / UC Course  I have reviewed the triage vital signs and the nursing notes.  Pertinent labs & imaging results that were available during my care of the patient were reviewed by me and considered in my medical decision making (see chart for details).     1.  Cellulitis of right lower extremity History and physical consistent with cellulitis.  Will start Bactrim today as patient has had adverse effect to minocycline in the past.  Patient is estimated creatinine clearance per Cockcroft-Gault equation is 58 ml/min, no need to dose adjust.  Patient to continue wearing compression sock for additional relief.  Of not patient goes to spas for routine foot care: This provider reviewed that due to diabetic neuropathy podiatry should be seen instead of Spa: Podiatry office information provided.  Return precautions discussed, patient verbalized understanding and is agreeable to plan. Final Clinical Impressions(s) / UC Diagnoses   Final diagnoses:  Cellulitis of right lower extremity without foot     Discharge Instructions     Take antibiotic as prescribed. Follow-up with podiatry about regular diabetic foot care. Return for worsening pain, swelling, redness, fever, discharge.    ED Prescriptions    Medication  Sig Dispense Auth. Provider   sulfamethoxazole-trimethoprim (BACTRIM DS) 800-160 MG tablet Take 1 tablet by mouth 2 (two) times daily for 7 days. 14 tablet Hall-Potvin, [place], PA-C     PDMP not reviewed this encounter.   Hall-Potvin, [place], [place] 02/17/19 1128

## 2019-02-17 NOTE — Discharge Instructions (Signed)
Take antibiotic as prescribed. Follow-up with podiatry about regular diabetic foot care. Return for worsening pain, swelling, redness, fever, discharge.

## 2019-02-17 NOTE — ED Triage Notes (Signed)
Pt presents to UC w/ c/o right calf pain since yesterday. Pt states pain radiates now to the front of the leg. Pt states it is painful to the touch. Right lower leg is slightly red.

## 2019-02-19 ENCOUNTER — Telehealth: Payer: Self-pay | Admitting: Emergency Medicine

## 2019-02-19 NOTE — Telephone Encounter (Signed)
Attempted to call patient to see how she is doing with antibiotic treatment for cellulitis from 10/10 UC visit.

## 2019-02-21 NOTE — Telephone Encounter (Signed)
Patient returned call, stating she is doing much better.  No further questions at this time.

## 2019-02-22 ENCOUNTER — Other Ambulatory Visit: Payer: Self-pay | Admitting: Internal Medicine

## 2019-02-22 ENCOUNTER — Ambulatory Visit (INDEPENDENT_AMBULATORY_CARE_PROVIDER_SITE_OTHER): Payer: PPO | Admitting: Internal Medicine

## 2019-02-22 ENCOUNTER — Other Ambulatory Visit: Payer: Self-pay

## 2019-02-22 VITALS — BP 134/84 | HR 76 | Temp 97.8°F | Resp 16 | Ht 61.0 in | Wt 240.0 lb

## 2019-02-22 DIAGNOSIS — L03115 Cellulitis of right lower limb: Secondary | ICD-10-CM

## 2019-02-22 DIAGNOSIS — I10 Essential (primary) hypertension: Secondary | ICD-10-CM | POA: Diagnosis not present

## 2019-02-22 DIAGNOSIS — E559 Vitamin D deficiency, unspecified: Secondary | ICD-10-CM

## 2019-02-22 DIAGNOSIS — E538 Deficiency of other specified B group vitamins: Secondary | ICD-10-CM

## 2019-02-22 DIAGNOSIS — E785 Hyperlipidemia, unspecified: Secondary | ICD-10-CM

## 2019-02-22 DIAGNOSIS — M65331 Trigger finger, right middle finger: Secondary | ICD-10-CM | POA: Diagnosis not present

## 2019-02-22 DIAGNOSIS — Z794 Long term (current) use of insulin: Secondary | ICD-10-CM

## 2019-02-22 DIAGNOSIS — M65332 Trigger finger, left middle finger: Secondary | ICD-10-CM | POA: Diagnosis not present

## 2019-02-22 DIAGNOSIS — E1122 Type 2 diabetes mellitus with diabetic chronic kidney disease: Secondary | ICD-10-CM | POA: Diagnosis not present

## 2019-02-22 DIAGNOSIS — E1169 Type 2 diabetes mellitus with other specified complication: Secondary | ICD-10-CM | POA: Diagnosis not present

## 2019-02-22 DIAGNOSIS — IMO0002 Reserved for concepts with insufficient information to code with codable children: Secondary | ICD-10-CM

## 2019-02-22 DIAGNOSIS — N183 Chronic kidney disease, stage 3 unspecified: Secondary | ICD-10-CM

## 2019-02-22 DIAGNOSIS — Z79899 Other long term (current) drug therapy: Secondary | ICD-10-CM | POA: Diagnosis not present

## 2019-02-22 DIAGNOSIS — M109 Gout, unspecified: Secondary | ICD-10-CM | POA: Diagnosis not present

## 2019-02-22 DIAGNOSIS — E1165 Type 2 diabetes mellitus with hyperglycemia: Secondary | ICD-10-CM | POA: Diagnosis not present

## 2019-02-22 DIAGNOSIS — Z23 Encounter for immunization: Secondary | ICD-10-CM

## 2019-02-22 MED ORDER — CEPHALEXIN 500 MG PO CAPS: ORAL_CAPSULE | ORAL | 1 refills | Status: DC

## 2019-02-22 NOTE — Patient Instructions (Signed)

## 2019-02-22 NOTE — Progress Notes (Signed)
History of Present Illness:      This very nice 78 y.o.  DWF presents for 3 month follow up with HTN, HLD, Pre-Diabetes and Vitamin D Deficiency.  Patient was seen 5 days ago in a [facility] & was dx'd with a RLE cellulitis & was started on Septra-DS.       Patient is treated for HTN (1997)  & BP has been controlled at home. Today's BP is at goal - 134/84. In Dec 2017, she had a NSTEMI and had PCA& Stent x 2. Patient has had no complaints of any cardiac type chest pain, palpitations, dyspnea / orthopnea / PND, dizziness, claudication, or dependent edema.      Hyperlipidemia is controlled with diet & meds. Patient denies myalgias or other med SE's. 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 Last vitamin D was at goal:  Lab Results  Component Value Date   VD25OH 59 02/22/2019   Current Outpatient Medications on File Prior to Visit  Medication Sig  . acetaminophen (TYLENOL) 325 MG tablet Take 2 tablets (650 mg total) by mouth every 4 (four) hours as needed for headache or mild pain.  Marland Kitchen allopurinol (ZYLOPRIM) 100 MG tablet TAKE 1 TABLET DAILY TO PREVENT GOUT  . aspirin EC 81 MG EC tablet Take 1 tablet (81 mg total) by mouth daily.  Marland Kitchen atorvastatin (LIPITOR) 80 MG tablet TAKE 1  TABLET (80 MG TOTAL) BY MOUTH DAILY AT 6 PM.  . Cholecalciferol (CVS VIT D 5000 HIGH-POTENCY) 5000 units capsule Take 5,000 Units by mouth 2 (two) times daily.   . diclofenac sodium (VOLTAREN) 1 % GEL Apply 4 g topically 4 (four) times daily.  Marland Kitchen escitalopram (LEXAPRO) 10 MG tablet Take 1 tablet (10 mg total) by mouth daily.  . famotidine (PEPCID) 20 MG tablet One at bedtime  . ferrous sulfate 325 (65 FE) MG tablet Take 325 mg by mouth daily with breakfast.  . FLECTOR 1.3 % PTCH APPLY 1 PATCH EVERY 12 HOURS AS NEEDED THEN OFF 12 HOURS  . furosemide (LASIX) 40 MG tablet Take 40 mg by mouth as needed for fluid.  Marland Kitchen gabapentin (NEURONTIN) 100 MG capsule Take 1 to 2 capsules 3 to 4 x /day as needed for Painful Diabetic Neuropathy  . insulin aspart (NOVOLOG FLEXPEN) 100 UNIT/ML FlexPen Inject 50  to 75 units 3 x /day before meals as directed for Diabetes  . Insulin Glargine (BASAGLAR KWIKPEN) 100 UNIT/ML SOPN INJECT 70 UNITS TOTAL INTO THE SKIN AT BEDTIME.  . Lancets (ONETOUCH ULTRASOFT) lancets Use as instructed  . losartan (COZAAR) 50 MG tablet Take 50 mg by mouth as directed. TAKE 1/2 TABLET BY MOUTH  DAILY  . nitroGLYCERIN (NITROSTAT) 0.4 MG SL tablet Place 1 tablet (0.4 mg total) under the tongue every 5 (five) minutes x 3 doses as needed for chest pain.  Glory Rosebush VERIO test strip USE AS INSTRUCTED TO CHECK SUGAR 2 TIMES DAILY.  Marland Kitchen OVER THE COUNTER MEDICATION   . rOPINIRole (REQUIP) 3 MG tablet TAKE 1 TABLET BY MOUTH FOUR TIMES A DAY  . tiZANidine (ZANAFLEX) 4 MG tablet Take 1/2 to 1 tablet 2 to 3 x /day as needed for Muscle Spasms  . vitamin B-12 (CYANOCOBALAMIN) 1000 MCG tablet Take 1,000 mcg by mouth daily.  . colchicine 0.6 MG tablet Take 2 tablets first day of gout flare, then one a day until gout flare is better  . liraglutide (VICTOZA) 18 MG/3ML SOPN Inject 0.3 mLs (1.8 mg total) into the skin once a week for 12 doses.   No current facility-administered medications on file prior to visit.      Allergies  Allergen Reactions  . Brilinta [Ticagrelor] Shortness Of Breath and Other (See Comments)    Chest pain (also)  . Aspirin Other (See Comments)    Can tolerate in small doses (is already on a blood thinner AND has kidney disease)  . Nsaids Other (See Comments)    Patient is taking a blood thinner and has kidney disease  . Minocycline Hcl Other (See Comments)    Welts  . Oruvail [Ketoprofen] Other (See Comments)    Has kidney disease and is taking a blood thinner  . Tricor [Fenofibrate]     Reaction unknown  . Vasotec [Enalaprilat]     Reaction unknown  . Zinc     Reaction unknown   PMHx:   Past Medical History:  Diagnosis Date  . Anemia    hx (04/14/2016)  . Anxiety   . Arthritis    "severe in my back; hands; ankles" (04/14/2016)  . Atypical chest pain 01/24/2018   Midline pain absent supine "constant" daytime since 12/31/17 > resolved as of 02/20/2018 on gerd/ gas diet   . Basal cell carcinoma    "several burned off; one cut off"  . CAD in native artery    a. NSTEMI 04/2016 - s/p DES toLAD and LCx. PCI to LCx notable for microembolization during cath.  . Chest pain- reslved with stopping Brilinta now on Plaix 04/16/2016  . Chronic lower back pain   . CKD (chronic kidney disease), stage III   . Depression   . DJD (degenerative joint disease)   . Family history of adverse reaction to anesthesia    "half-sister used to get real sick" (04/14/2016)  . GERD (gastroesophageal reflux disease)   . History of gout   . Hyperlipidemia   . Hypertension   . IBS (irritable bowel syndrome)   . Ischemic cardiomyopathy    a. 04/2016: EF 40-50% by cath, 50-55% +WMA by echo.  . 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 ([place]) ~ 2015  . Morbid obesity ([place])   . NSTEMI (non-ST elevated myocardial infarction) ([place]) 04/14/2016  . OSA on CPAP   . Peripheral neuropathy   . Peripheral vascular disease ([place])   . RLS (restless legs syndrome)   . Spinal stenosis   . Type II diabetes  mellitus ([place])   . Vitamin D deficiency    Immunization History  Administered Date(s) Administered  . DT 04/14/2015  . Hepatitis B 07/09/1999, 08/09/1999, 01/09/2000  . Influenza, High Dose Seasonal PF 03/20/2014, 02/14/2017, 04/12/2018, 02/22/2019  . Influenza,inj,quad, With Preservative  04/26/2016  . Influenza-Unspecified 03/28/2015, 04/14/2015, 02/14/2017  . Pneumococcal Conjugate-13 03/20/2014  . Pneumococcal Polysaccharide-23 10/15/2009  . Td 03/19/2004   Past Surgical History:  Procedure Laterality Date  . APPENDECTOMY    . BACK SURGERY    . BASAL CELL CARCINOMA EXCISION Left    leg  . CARDIAC CATHETERIZATION N/A 04/15/2016   Procedure: Left Heart Cath and Coronary Angiography;  Surgeon: Belva Crome, MD;  Location: [facility];  Service: Cardiovascular;  Laterality: N/A;  . CARDIAC CATHETERIZATION N/A 04/15/2016   Procedure: Coronary Stent Intervention;  Surgeon: Belva Crome, MD;  Location: [facility];  Service: Cardiovascular;  Laterality: N/A;  Mid LAD Mid CFX  . CATARACT EXTRACTION, BILATERAL Bilateral   . JOINT REPLACEMENT    . LUMBAR DISC SURGERY  X 2  . MELANOMA EXCISION Left    "towards the back of my neck"  . SHOULDER ARTHROSCOPY W/ ROTATOR CUFF REPAIR Left   . SHOULDER OPEN ROTATOR CUFF REPAIR Right   . TOTAL KNEE ARTHROPLASTY Bilateral    FHx:    Reviewed / unchanged  SHx:    Reviewed / unchanged   Systems Review:  Constitutional: Denies fever, chills, wt changes, headaches, insomnia, fatigue, night sweats, change in appetite. Eyes: Denies redness, blurred vision, diplopia, discharge, itchy, watery eyes.  ENT: Denies discharge, congestion, post nasal drip, epistaxis, sore throat, earache, hearing loss, dental pain, tinnitus, vertigo, sinus pain, snoring.  CV: Denies chest pain, palpitations, irregular heartbeat, syncope, dyspnea, diaphoresis, orthopnea, PND, claudication or edema. Respiratory: denies cough, dyspnea, DOE, pleurisy,  hoarseness, laryngitis, wheezing.  Gastrointestinal: Denies dysphagia, odynophagia, heartburn, reflux, water brash, abdominal pain or cramps, nausea, vomiting, bloating, diarrhea, constipation, hematemesis, melena, hematochezia  or hemorrhoids. Genitourinary: Denies dysuria, frequency, urgency, nocturia, hesitancy, discharge, hematuria or flank pain. Musculoskeletal: Denies arthralgias, myalgias, stiffness, jt. swelling, pain, limping or strain/sprain.  Skin: Denies pruritus, rash, hives, warts, acne, eczema or change in skin lesion(s). Neuro: No weakness, tremor, incoordination, spasms, paresthesia or pain. Psychiatric: Denies confusion, memory loss or sensory loss. Endo: Denies change in weight, skin or hair change.  Heme/Lymph: No excessive bleeding, bruising or enlarged lymph nodes.  Physical Exam  BP 134/84   Pulse 76   Temp 97.8 F (36.6 C)   Resp 16   Ht 5\' 1"  (1.549 m)   Wt 240 lb (108.9 kg)   BMI 45.35 kg/m   Appears  well nourished, well groomed  and in no distress.  Eyes: PERRLA, EOMs, conjunctiva no swelling or erythema. Sinuses: No frontal/maxillary tenderness ENT/Mouth: EAC's clear, TM's nl w/o erythema, bulging. Nares clear w/o erythema, swelling, exudates. Oropharynx clear without erythema or exudates. Oral hygiene is good. Tongue normal, non obstructing. Hearing intact.  Neck: Supple. Thyroid not palpable. Car 2+/2+ without bruits, nodes or JVD. Chest: Respirations nl with BS clear & equal w/o rales, rhonchi, wheezing or stridor.  Cor: Heart sounds normal w/ regular rate and rhythm without sig. murmurs, gallops, clicks or rubs. Peripheral pulses normal and equal  without edema.  Abdomen: Soft & bowel sounds normal. Non-tender w/o guarding, rebound, hernias, masses or organomegaly.  Lymphatics: Unremarkable.  Musculoskeletal: Full ROM all peripheral extremities, joint stability, 5/5 strength and normal gait.  Skin: Warm, dry without exposed rashes, lesions or  ecchymosis apparent.  Neuro: Cranial nerves intact, reflexes equal bilaterally. Sensory-motor testing grossly intact. Tendon reflexes grossly intact.  Pysch: Alert & oriented x 3.  Insight and judgement nl & appropriate. No ideations.  Assessment and Plan:  1. Essential hypertension  - Continue medication, monitor blood pressure at home.  - Continue DASH diet.  Reminder to go to the ER if any CP,  SOB, nausea, dizziness, severe HA, changes vision/speech.  - CBC with Differential/Platelet - COMPLETE METABOLIC PANEL WITH GFR - Magnesium - TSH  2. Hyperlipidemia associated with type 2 diabetes mellitus ([place])  - Continue diet/meds, exercise,& lifestyle modifications.  - Continue monitor periodic cholesterol/liver & renal functions   - Lipid panel - TSH  3. Uncontrolled type 2 diabetes mellitus with stage 3 chronic kidney disease, with long-term current use of insulin ([facility])  - Continue diet, exercise  - Lifestyle modifications.  - Monitor appropriate labs.  - Hemoglobin A1c  4. Vitamin D deficiency  - Continue supplementation.  - VITAMIN D 25 Hydroxy  5. Cellulitis of right leg  - cephALEXin (KEFLEX) 500 MG capsule; Take 1 capsule 3 x /day after meals for Skin Infection  Dispense: 60 capsule; Refill: 1  6. B12 deficiency  - Vitamin B12  7. Gout  - Uric acid  8. Medication management  - CBC with Differential/Platelet - COMPLETE METABOLIC PANEL WITH GFR - Magnesium - Lipid panel - TSH - Hemoglobin A1c - VITAMIN D 25 Hydroxy - Vitamin B12 - Uric acid  9. Need for immunization against influenza  - Flu vaccine HIGH DOSE PF (Fluzone High dose)          Discussed  regular exercise, BP monitoring, weight control to achieve/maintain BMI less than 25 and discussed med and SE's. Recommended labs to assess and monitor clinical status with further disposition pending results of labs.  I discussed the assessment and treatment plan with the patient. The patient was  provided an opportunity to ask questions and all were answered. The patient agreed with the plan and demonstrated an understanding of the instructions.  I provided over 30 minutes of exam, counseling, chart review and  complex critical decision making.  Kirtland Bouchard, MD

## 2019-02-23 LAB — COMPLETE METABOLIC PANEL WITH GFR
AG Ratio: 1.5 (calc) (ref 1.0–2.5)
ALT: 12 U/L (ref 6–29)
AST: 13 U/L (ref 10–35)
Albumin: 4 g/dL (ref 3.6–5.1)
Alkaline phosphatase (APISO): 88 U/L (ref 37–153)
BUN/Creatinine Ratio: 18 (calc) (ref 6–22)
BUN: 27 mg/dL — ABNORMAL HIGH (ref 7–25)
CO2: 27 mmol/L (ref 20–32)
Calcium: 9.2 mg/dL (ref 8.6–10.4)
Chloride: 103 mmol/L (ref 98–110)
Creat: 1.53 mg/dL — ABNORMAL HIGH (ref 0.60–0.93)
GFR, Est African American: 37 mL/min/{1.73_m2} — ABNORMAL LOW (ref >=60)
GFR, Est Non African American: 32 mL/min/{1.73_m2} — ABNORMAL LOW (ref >=60)
Globulin: 2.7 g/dL (calc) (ref 1.9–3.7)
Glucose, Bld: 120 mg/dL — ABNORMAL HIGH (ref 65–99)
Potassium: 5.1 mmol/L (ref 3.5–5.3)
Sodium: 137 mmol/L (ref 135–146)
Total Bilirubin: 0.5 mg/dL (ref 0.2–1.2)
Total Protein: 6.7 g/dL (ref 6.1–8.1)

## 2019-02-23 LAB — CBC WITH DIFFERENTIAL/PLATELET
Absolute Monocytes: 70 cells/uL — ABNORMAL LOW (ref 200–950)
Basophils Absolute: 0 cells/uL (ref 0–200)
Basophils Relative: 0 %
Eosinophils Absolute: 61 cells/uL (ref 15–500)
Eosinophils Relative: 1.9 %
HCT: 33.9 % — ABNORMAL LOW (ref 35.0–45.0)
Hemoglobin: 11.4 g/dL — ABNORMAL LOW (ref 11.7–15.5)
Lymphs Abs: 448 cells/uL — ABNORMAL LOW (ref 850–3900)
MCH: 32.2 pg (ref 27.0–33.0)
MCHC: 33.6 g/dL (ref 32.0–36.0)
MCV: 95.8 fL (ref 80.0–100.0)
MPV: 10.1 fL (ref 7.5–12.5)
Monocytes Relative: 2.2 %
Neutro Abs: 2621 cells/uL (ref 1500–7800)
Neutrophils Relative %: 81.9 %
Platelets: 164 10*3/uL (ref 140–400)
RBC: 3.54 10*6/uL — ABNORMAL LOW (ref 3.80–5.10)
RDW: 13.1 % (ref 11.0–15.0)
Total Lymphocyte: 14 %
WBC: 3.2 10*3/uL — ABNORMAL LOW (ref 3.8–10.8)

## 2019-02-23 LAB — LIPID PANEL
Cholesterol: 133 mg/dL (ref <200)
HDL: 40 mg/dL — ABNORMAL LOW (ref >=50)
LDL Cholesterol (Calc): 64 mg/dL (calc)
Non-HDL Cholesterol (Calc): 93 mg/dL (calc) (ref <130)
Total CHOL/HDL Ratio: 3.3 (calc) (ref <5.0)
Triglycerides: 249 mg/dL — ABNORMAL HIGH (ref <150)

## 2019-02-23 LAB — TSH: TSH: 0.65 mIU/L (ref 0.40–4.50)

## 2019-02-23 LAB — HEMOGLOBIN A1C
Hgb A1c MFr Bld: 7.8 % of total Hgb — ABNORMAL HIGH (ref <5.7)
Mean Plasma Glucose: 177 (calc)
eAG (mmol/L): 9.8 (calc)

## 2019-02-23 LAB — MAGNESIUM: Magnesium: 2 mg/dL (ref 1.5–2.5)

## 2019-02-23 LAB — VITAMIN B12: Vitamin B-12: 1402 pg/mL — ABNORMAL HIGH (ref 200–1100)

## 2019-02-23 LAB — VITAMIN D 25 HYDROXY (VIT D DEFICIENCY, FRACTURES): Vit D, 25-Hydroxy: 59 ng/mL (ref 30–100)

## 2019-02-23 LAB — URIC ACID: Uric Acid, Serum: 4.5 mg/dL (ref 2.5–7.0)

## 2019-02-25 ENCOUNTER — Encounter: Payer: Self-pay | Admitting: Internal Medicine

## 2019-02-28 ENCOUNTER — Encounter: Payer: Self-pay | Admitting: *Deleted

## 2019-03-07 ENCOUNTER — Ambulatory Visit (INDEPENDENT_AMBULATORY_CARE_PROVIDER_SITE_OTHER): Payer: PPO | Admitting: Internal Medicine

## 2019-03-07 ENCOUNTER — Other Ambulatory Visit: Payer: Self-pay

## 2019-03-07 VITALS — BP 130/68 | HR 76 | Temp 97.4°F | Resp 16 | Ht 63.0 in | Wt 241.4 lb

## 2019-03-07 DIAGNOSIS — L03115 Cellulitis of right lower limb: Secondary | ICD-10-CM

## 2019-03-07 NOTE — Progress Notes (Signed)
Subjective:    Patient ID: Janet Choi, female    DOB: 11/13/1940, 78 y.o.   MRN: [id number]  HPI       This very nice 78 y.o.  DWF with HTN, HLD, T2_DM/CKD3  and Vitamin D Deficiency  Returns for short 2 week f/u of a RLE cellulitis treated with SXT-DS  From the ER and treated over the last 2 weeks with Keflex. Patient feels redness and warmth has improved.  Medication Sig  . acetaminophen (TYLENOL) 325 MG tablet Take 2 tablets (650 mg total) by mouth every 4 (four) hours as needed for headache or mild pain.  Marland Kitchen allopurinol (ZYLOPRIM) 100 MG tablet TAKE 1 TABLET DAILY TO PREVENT GOUT  . aspirin EC 81 MG EC tablet Take 1 tablet (81 mg total) by mouth daily.  Marland Kitchen atorvastatin (LIPITOR) 80 MG tablet TAKE 1 TABLET (80 MG TOTAL) BY MOUTH DAILY AT 6 PM.  . cephALEXin (KEFLEX) 500 MG capsule Take 1 capsule 3 x /day after meals for Skin Infection  . Cholecalciferol (CVS VIT D 5000 HIGH-POTENCY) 5000 units capsule Take 5,000 Units by mouth 2 (two) times daily.   . colchicine 0.6 MG tablet Take 2 tablets first day of gout flare, then one a day until gout flare is better  . diclofenac sodium (VOLTAREN) 1 % GEL Apply 4 g topically 4 (four) times daily.  Marland Kitchen escitalopram (LEXAPRO) 10 MG tablet Take 1 tablet (10 mg total) by mouth daily.  . famotidine (PEPCID) 20 MG tablet One at bedtime  . ferrous sulfate 325 (65 FE) MG tablet Take 325 mg by mouth daily with breakfast.  . FLECTOR 1.3 % PTCH APPLY 1 PATCH EVERY 12 HOURS AS NEEDED THEN OFF 12 HOURS  . furosemide (LASIX) 40 MG tablet Take 40 mg by mouth as needed for fluid.  Marland Kitchen gabapentin (NEURONTIN) 100 MG capsule Take 1 to 2 capsules 3 to 4 x /day as needed for Painful Diabetic Neuropathy  . insulin aspart (NOVOLOG FLEXPEN) 100 UNIT/ML FlexPen Inject 50  to 75 units 3 x /day before meals as directed for Diabetes  . Insulin Glargine (BASAGLAR KWIKPEN) 100 UNIT/ML SOPN INJECT 70 UNITS TOTAL INTO THE SKIN AT BEDTIME.  . Insulin Pen Needle (BD PEN NEEDLE NANO  U/F) 32G X 4 MM MISC USE AS DIRECTED 4 TIMES A DAY  . Lancets (ONETOUCH ULTRASOFT) lancets Use as instructed  . liraglutide (VICTOZA) 18 MG/3ML SOPN Inject 0.3 mLs (1.8 mg total) into the skin once a week for 12 doses.  Marland Kitchen losartan (COZAAR) 50 MG tablet Take 50 mg by mouth as directed. TAKE 1/2 TABLET BY MOUTH DAILY  . nitroGLYCERIN (NITROSTAT) 0.4 MG SL tablet Place 1 tablet (0.4 mg total) under the tongue every 5 (five) minutes x 3 doses as needed for chest pain.  Glory Rosebush VERIO test strip USE AS INSTRUCTED TO CHECK SUGAR 2 TIMES DAILY.  Marland Kitchen OVER THE COUNTER MEDICATION   . rOPINIRole (REQUIP) 3 MG tablet TAKE 1 TABLET BY MOUTH FOUR TIMES A DAY  . tiZANidine (ZANAFLEX) 4 MG tablet Take 1/2 to 1 tablet 2 to 3 x /day as needed for Muscle Spasms  . vitamin B-12 (CYANOCOBALAMIN) 1000 MCG tablet Take 1,000 mcg by mouth daily.    Allergies  Allergen Reactions  . Brilinta [Ticagrelor] Shortness Of Breath and Other (See Comments)    Chest pain (also)  . Aspirin Other (See Comments)    Can tolerate in small doses (is already on a blood thinner  AND has kidney disease)  . Nsaids Other (See Comments)    Patient is taking a blood thinner and has kidney disease  . Minocycline Hcl Other (See Comments)    Welts  . Oruvail [Ketoprofen] Other (See Comments)    Has kidney disease and is taking a blood thinner  . Tricor [Fenofibrate]     Reaction unknown  . Vasotec [Enalaprilat]     Reaction unknown  . Zinc     Reaction unknown   Past Medical History:  Diagnosis Date  . Anemia    hx (04/14/2016)  . Anxiety   . Arthritis    "severe in my back; hands; ankles" (04/14/2016)  . Atypical chest pain 01/24/2018   Midline pain absent supine "constant" daytime since 12/31/17 > resolved as of 02/20/2018 on gerd/ gas diet   . Basal cell carcinoma    "several burned off; one cut off"  . CAD in native artery    a. NSTEMI 04/2016 - s/p DES toLAD and LCx. PCI to LCx notable for microembolization during cath.  .  Chest pain- reslved with stopping Brilinta now on Plaix 04/16/2016  . Chronic lower back pain   . CKD (chronic kidney disease), stage III   . Depression   . DJD (degenerative joint disease)   . Family history of adverse reaction to anesthesia    "half-sister used to get real sick" (04/14/2016)  . GERD (gastroesophageal reflux disease)   . History of gout   . Hyperlipidemia   . Hypertension   . IBS (irritable bowel syndrome)   . Ischemic cardiomyopathy    a. 04/2016: EF 40-50% by cath, 50-55% +WMA by echo.  . 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 ([place]) ~ 2015  . Morbid obesity ([place])   . NSTEMI (non-ST elevated myocardial infarction) ([place]) 04/14/2016  . OSA on CPAP   . Peripheral neuropathy   . Peripheral vascular disease ([place])   . RLS (restless legs syndrome)   . Spinal stenosis   . Type II diabetes mellitus ([place])   . Vitamin D deficiency    Review of Systems     Objective:   Physical Exam  There were no vitals taken for this visit.  HEENT - WNL. Neck - supple.  Chest - Clear equal BS. Cor - Nl HS. RRR w/o sig MGR. PP 1(+). No edema. MS- FROM w/o deformities.  Gait Nl. Neuro -  Nl w/o focal abnormalities.\ Skin -     Assessment & Plan:   1. Cellulitis of right leg, improving   - recc complete the 3 week course of Keflex & advised call if erythema/ calor persists

## 2019-03-10 ENCOUNTER — Encounter: Payer: Self-pay | Admitting: Internal Medicine

## 2019-03-10 ENCOUNTER — Other Ambulatory Visit: Payer: Self-pay | Admitting: Internal Medicine

## 2019-03-10 ENCOUNTER — Other Ambulatory Visit: Payer: Self-pay | Admitting: Adult Health

## 2019-03-10 DIAGNOSIS — F3341 Major depressive disorder, recurrent, in partial remission: Secondary | ICD-10-CM

## 2019-03-11 ENCOUNTER — Other Ambulatory Visit: Payer: Self-pay | Admitting: Internal Medicine

## 2019-03-14 DIAGNOSIS — G5602 Carpal tunnel syndrome, left upper limb: Secondary | ICD-10-CM | POA: Diagnosis not present

## 2019-03-14 DIAGNOSIS — G5601 Carpal tunnel syndrome, right upper limb: Secondary | ICD-10-CM | POA: Diagnosis not present

## 2019-03-22 DIAGNOSIS — G5601 Carpal tunnel syndrome, right upper limb: Secondary | ICD-10-CM | POA: Diagnosis not present

## 2019-03-23 ENCOUNTER — Telehealth: Payer: Self-pay

## 2019-03-23 NOTE — Telephone Encounter (Signed)
   Primary Cardiologist: Skeet Latch, MD  Chart reviewed as part of pre-operative protocol coverage. Patient was contacted 03/23/2019 in reference to pre-operative risk assessment for pending surgery as outlined below.  Janet Choi was last seen on 07/03/18 by Dr. Oval Linsey. She has history of CAD and NSTEMI s/p PCI, hypertensive heart disease, OSA, DM, morbid obesity, CKD stage III, chronic diastolic CHF.   At last OV 06/2018, medications were adjusted at that time for low blood pressure and dizziness with recommendation for f/u in 7 months. Do not see this has occurred yet. I tried to reach out to patient to see how she is doing but got VM. LMOM to call us back.  Will reach out to Dr. Oval Linsey to find out if she would be OK with patient holding ASA for this procedure. Please route response to [facility] PREOP (the pre-op pool). Thank you.   Charlie Pitter, PA-C 03/23/2019, 3:31 PM

## 2019-03-23 NOTE — Telephone Encounter (Signed)
   [facility] Pre-operative Risk Assessment    Request for surgical clearance:  1. What type of surgery is being performed? Right Endoscopic Carpal Tunnel Release   2. When is this surgery scheduled? TBD  3. What type of clearance is required Both  4. Are there any medications that need to be held prior to surgery  Aspirin  5. Practice name and name of physician performing surgery? [facility]    6. What is your office phone number [phone]   7.   What is your office fax number [phone]  8.   Anesthesia type  Not listed   Kathyrn Lass 03/23/2019, 2:46 PM  _________________________________________________________________   (provider comments below)

## 2019-03-25 ENCOUNTER — Other Ambulatory Visit: Payer: Self-pay | Admitting: Physician Assistant

## 2019-03-26 NOTE — Telephone Encounter (Signed)
Called and left message asking patient to call back.

## 2019-03-28 ENCOUNTER — Other Ambulatory Visit: Payer: Self-pay | Admitting: Internal Medicine

## 2019-04-03 ENCOUNTER — Other Ambulatory Visit: Payer: Self-pay | Admitting: Internal Medicine

## 2019-04-03 DIAGNOSIS — E1122 Type 2 diabetes mellitus with diabetic chronic kidney disease: Secondary | ICD-10-CM

## 2019-04-28 ENCOUNTER — Other Ambulatory Visit: Payer: Self-pay | Admitting: Internal Medicine

## 2019-04-29 ENCOUNTER — Encounter: Payer: Self-pay | Admitting: Internal Medicine

## 2019-04-29 NOTE — Patient Instructions (Signed)

## 2019-04-29 NOTE — Progress Notes (Signed)
Annual Screening/Preventative Visit & Comprehensive Evaluation &  Examination     This very nice 78 y.o. DWF  presents for a Screening /Preventative Visit & comprehensive evaluation and management of multiple medical co-morbidities.  Patient has been followed for HTN, HLD, Ins req T2_DM  and Vitamin D Deficiency. Patient's Gout is controlled on Allopurinol. Patient also has GERD controlled on her meds. Patient is on CPAP for OSA with improved restorative sleep.      HTN predates since 1997. Patient has hx/o ASCAD s/p NSTEMI in Dec 2017 and had PTCA & 2 stents. Patient has done well since.  Patient's BP has been controlled at home and patient denies any cardiac symptoms as chest pain, palpitations, shortness of breath, dizziness or ankle swelling. Today's BP was initially slightly elevated & rechecked at goal - 136/82      Patient's hyperlipidemia is controlled with diet and medications. Patient denies myalgias or other medication SE's. Last lipids were at goal except elevated Trig's:  Lab Results  Component Value Date   CHOL 133 02/22/2019   HDL 40 (L) 02/22/2019   LDLCALC 64 02/22/2019   TRIG 249 (H) 02/22/2019   CHOLHDL 3.3 02/22/2019      Patient has Gluttony with consequent Morbid Obesity (BMI 45.29) and  hx/o T2_NIDDM (circa 2009)  and CKD3 the latter followed by Dr Marval Regal.  Patient has been on Insulin since since 2017. Patient denies reactive hypoglycemic symptoms, visual blurring, diabetic polys, but does have peripheral sensory Neuropathy w/burning dysthesias of her distal LE's.  paresthesias. Last A1c was not at goal:  Lab Results  Component Value Date   HGBA1C 7.8 (H) 02/22/2019      Finally, patient has history of Vitamin D Deficiency ("25"/2008)  and last Vitamin D was at goal:  Lab Results  Component Value Date   VD25OH 59 02/22/2019   Current Outpatient Medications on File Prior to Visit  Medication Sig  . acetaminophen (TYLENOL) 325 MG tablet Take 2 tablets (650  mg total) by mouth every 4 (four) hours as needed for headache or mild pain.  Marland Kitchen allopurinol (ZYLOPRIM) 100 MG tablet TAKE 1 TABLET DAILY TO PREVENT GOUT  . aspirin EC 81 MG EC tablet Take 1 tablet (81 mg total) by mouth daily.  Marland Kitchen buPROPion (WELLBUTRIN XL) 300 MG 24 hr tablet Take 1 tablet (300 mg total) by mouth daily. Take 1 tablet Daily for Mood  . Cholecalciferol (CVS VIT D 5000 HIGH-POTENCY) 5000 units capsule Take 5,000 Units by mouth 2 (two) times daily.   . diclofenac sodium (VOLTAREN) 1 % GEL Apply 4 g topically 4 (four) times daily.  Marland Kitchen escitalopram (LEXAPRO) 10 MG tablet Take 1 tablet (10 mg total) by mouth daily.  . famotidine (PEPCID) 20 MG tablet One at bedtime  . ferrous sulfate 325 (65 FE) MG tablet Take 325 mg by mouth daily with breakfast.  . furosemide (LASIX) 40 MG tablet Take 40 mg by mouth as needed for fluid.  Marland Kitchen gabapentin (NEURONTIN) 100 MG capsule Take 1 to 2 capsules 3 to 4 x /day as needed for Painful Diabetic Neuropathy  . insulin aspart (NOVOLOG FLEXPEN) 100 UNIT/ML FlexPen Inject 50  to 75 units 3 x /day before meals as directed for Diabetes  . Insulin Pen Needle (BD PEN NEEDLE NANO U/F) 32G X 4 MM MISC USE AS DIRECTED 4 TIMES A DAY  . Lancets (ONETOUCH ULTRASOFT) lancets Use as instructed  . losartan (COZAAR) 50 MG tablet Take 1 tablet Daily for BP &  Diabetic Kidney Protection  . nitroGLYCERIN (NITROSTAT) 0.4 MG SL tablet Place 1 tablet (0.4 mg total) under the tongue every 5 (five) minutes x 3 doses as needed for chest pain.  Glory Rosebush VERIO test strip USE AS INSTRUCTED TO CHECK SUGAR 2 TIMES DAILY.  Marland Kitchen OVER THE COUNTER MEDICATION   . rOPINIRole (REQUIP) 3 MG tablet Take 1 tablet 4 x /day for Restless Legs  . tiZANidine (ZANAFLEX) 4 MG tablet Take 1/2 to 1 tablet 2 to 3 x /day . . . ONLY . Marland Kitchen .  if needed for Muscle Spasms  . VICTOZA 18 MG/3ML SOPN INJECT 0.3 MLS (1.8 MG TOTAL) INTO THE SKIN ONCE A WEEK FOR 12 DOSES.  Marland Kitchen vitamin B-12 (CYANOCOBALAMIN) 1000 MCG tablet  Take 1,000 mcg by mouth daily.  . colchicine 0.6 MG tablet Take 2 tablets first day of gout flare, then one a day until gout flare is better   No current facility-administered medications on file prior to visit.   Allergies  Allergen Reactions  . Brilinta [Ticagrelor] Shortness Of Breath and Other (See Comments)    Chest pain (also)  . Aspirin Other (See Comments)    Can tolerate in small doses (is already on a blood thinner AND has kidney disease)  . Nsaids Other (See Comments)    Patient is taking a blood thinner and has kidney disease  . Minocycline Hcl Other (See Comments)    Welts  . Oruvail [Ketoprofen] Other (See Comments)    Has kidney disease and is taking a blood thinner  . Tricor [Fenofibrate]     Reaction unknown  . Vasotec [Enalaprilat]     Reaction unknown  . Zinc     Reaction unknown   Past Medical History:  Diagnosis Date  . Anemia    hx (04/14/2016)  . Anxiety   . Arthritis    "severe in my back; hands; ankles" (04/14/2016)  . Atypical chest pain 01/24/2018   Midline pain absent supine "constant" daytime since 12/31/17 > resolved as of 02/20/2018 on gerd/ gas diet   . Basal cell carcinoma    "several burned off; one cut off"  . CAD in native artery    a. NSTEMI 04/2016 - s/p DES toLAD and LCx. PCI to LCx notable for microembolization during cath.  . Chest pain- reslved with stopping Brilinta now on Plaix 04/16/2016  . Chronic lower back pain   . CKD (chronic kidney disease), stage III   . Depression   . DJD (degenerative joint disease)   . Family history of adverse reaction to anesthesia    "half-sister used to get real sick" (04/14/2016)  . GERD (gastroesophageal reflux disease)   . History of gout   . Hyperlipidemia   . Hypertension   . IBS (irritable bowel syndrome)   . Ischemic cardiomyopathy    a. 04/2016: EF 40-50% by cath, 50-55% +WMA by echo.  . 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 ([place]) ~ 2015  . Morbid obesity ([place])   . NSTEMI (non-ST  elevated myocardial infarction) ([place]) 04/14/2016  . OSA on CPAP   . Peripheral neuropathy   . Peripheral vascular disease ([place])   . RLS (restless legs syndrome)   . Spinal stenosis   . Type II diabetes mellitus ([place])   . Vitamin D deficiency    Health Maintenance  Topic Date Due  . OPHTHALMOLOGY EXAM  02/21/2018  . HEMOGLOBIN A1C  08/23/2019  . FOOT EXAM  04/28/2020  . TETANUS/TDAP  04/13/2025  .  INFLUENZA VACCINE  Completed  . DEXA SCAN  Completed  . PNA vac Low Risk Adult  Completed   Immunization History  Administered Date(s) Administered  . DT 04/14/2015  . Hepatitis B 07/09/1999, 08/09/1999, 01/09/2000  . Influenza, High Dose Seasonal PF 03/20/2014, 02/14/2017, 04/12/2018, 02/22/2019  . Influenza,inj,quad, With Preservative 04/26/2016  . Influenza-Unspecified 03/28/2015, 04/14/2015, 02/14/2017  . Pneumococcal Conjugate-13 03/20/2014  . Pneumococcal Polysaccharide-23 10/15/2009  . Td 03/19/2004   Last Colon -  Last MGM -  Past Surgical History:  Procedure Laterality Date  . APPENDECTOMY    . BACK SURGERY    . BASAL CELL CARCINOMA EXCISION Left    leg  . CARDIAC CATHETERIZATION N/A 04/15/2016   Procedure: Left Heart Cath and Coronary Angiography;  Surgeon: Belva Crome, MD;  Location: [facility];  Service: Cardiovascular;  Laterality: N/A;  . CARDIAC CATHETERIZATION N/A 04/15/2016   Procedure: Coronary Stent Intervention;  Surgeon: Belva Crome, MD;  Location: [facility];  Service: Cardiovascular;  Laterality: N/A;  Mid LAD Mid CFX  . CATARACT EXTRACTION, BILATERAL Bilateral   . JOINT REPLACEMENT    . LUMBAR DISC SURGERY  X 2  . MELANOMA EXCISION Left    "towards the back of my neck"  . SHOULDER ARTHROSCOPY W/ ROTATOR CUFF REPAIR Left   . SHOULDER OPEN ROTATOR CUFF REPAIR Right   . TOTAL KNEE ARTHROPLASTY Bilateral    Family History  Problem Relation Age of Onset  . Heart disease Mother   . Kidney disease Father   . AAA (abdominal aortic  aneurysm) Father   . Heart disease Son   . Colon cancer Neg Hx   . Colon polyps Neg Hx   . Esophageal cancer Neg Hx   . Pancreatic cancer Neg Hx   . Stomach cancer Neg Hx   . Liver disease Neg Hx   . Diabetes Neg Hx    Social History   Tobacco Use  . Smoking status: Former Smoker    Packs/day: 2.50    Years: 25.00    Pack years: 62.50    Types: Cigarettes    Quit date: 11/11/1983    Years since quitting: 35.4  . Smokeless tobacco: Former Network engineer Use Topics  . Alcohol use: No    Alcohol/week: 0.0 standard drinks  . Drug use: No    ROS Constitutional: Denies fever, chills, weight loss/gain, headaches, insomnia,  night sweats, and change in appetite. Does c/o fatigue. Eyes: Denies redness, blurred vision, diplopia, discharge, itchy, watery eyes.  ENT: Denies discharge, congestion, post nasal drip, epistaxis, sore throat, earache, hearing loss, dental pain, Tinnitus, Vertigo, Sinus pain, snoring.  Cardio: Denies chest pain, palpitations, irregular heartbeat, syncope, dyspnea, diaphoresis, orthopnea, PND, claudication, edema Respiratory: denies cough, dyspnea, DOE, pleurisy, hoarseness, laryngitis, wheezing.  Gastrointestinal: Denies dysphagia, heartburn, reflux, water brash, pain, cramps, nausea, vomiting, bloating, diarrhea, constipation, hematemesis, melena, hematochezia, jaundice, hemorrhoids Genitourinary: Denies dysuria, frequency, urgency, nocturia, hesitancy, discharge, hematuria, flank pain Breast: Breast lumps, nipple discharge, bleeding.  Musculoskeletal: Denies arthralgia, myalgia, stiffness, Jt. Swelling, pain, limp, and strain/sprain. Denies falls. Skin: Denies puritis, rash, hives, warts, acne, eczema, changing in skin lesion Neuro: No weakness, tremor, incoordination, spasms, paresthesia, pain Psychiatric: Denies confusion, memory loss, sensory loss. Denies Depression. Endocrine: Denies change in weight, skin, hair change, nocturia, and paresthesia, diabetic  polys, visual blurring, hyper / hypo glycemic episodes.  Heme/Lymph: No excessive bleeding, bruising, enlarged lymph nodes.  Physical Exam  BP 136/82   Pulse 80  Temp 97.6 F (36.4 C)   Resp 18   Ht 5' 1.75" (1.568 m)   Wt 245 lb 9.6 oz (111.4 kg)   BMI 45.29 kg/m   General Appearance: Over nourished and in no apparent distress.  Eyes: PERRLA, EOMs, conjunctiva no swelling or erythema, normal fundi and vessels. Sinuses: No frontal/maxillary tenderness ENT/Mouth: EACs patent / TMs  nl. Nares clear without erythema, swelling, mucoid exudates. Oral hygiene is good. No erythema, swelling, or exudate. Tongue normal, non-obstructing. Tonsils not swollen or erythematous. Hearing normal.  Neck: Supple, thyroid not palpable. No bruits, nodes or JVD. Respiratory: Respiratory effort normal.  BS equal and clear bilateral without rales, rhonci, wheezing or stridor. Cardio: Heart sounds are normal with regular rate and rhythm and no murmurs, rubs or gallops. Peripheral pulses are normal and equal bilaterally without edema. No aortic or femoral bruits. Chest: symmetric with normal excursions and percussion. Breasts: Symmetric, without lumps, nipple discharge, retractions, or fibrocystic changes.  Abdomen:  Rotund, soft with bowel sounds active. Nontender, no guarding, rebound, hernias, masses, or organomegaly.  Lymphatics: Non tender without lymphadenopathy.  Musculoskeletal: Full ROM all peripheral extremities, joint stability, 5/5 strength, and normal gait. Skin: Warm and dry without rashes, lesions, cyanosis, clubbing or  ecchymosis.  Neuro: Cranial nerves intact, reflexes equal bilaterally. Normal muscle tone, no cerebellar symptoms. Sensation intact.  Pysch: Alert and oriented X 3, normal affect, Insight and Judgment appropriate.   Assessment and Plan  1. Annual Preventative Screening Examination  2. Essential hypertension  - EKG 12-Lead - Microalbumin / Creatinine Urine Ratio - CBC  with Diff - COMPLETE METABOLIC PANEL WITH GFR - Magnesium - TSH - Urinalysis, Routine w reflex microscopic  3. Hyperlipidemia associated with type 2 diabetes mellitus ([place])  - EKG 12-Lead - Lipid Profile - TSH  4. Uncontrolled type 2 diabetes mellitus with stage 3 chronic kidney disease,  with long-term current use of insulin ([facility])  - EKG 12-Lead - Microalbumin / Creatinine Urine Ratio - Hemoglobin A1c ([facility]) - Urinalysis, Routine w reflex microscopic  5. Vitamin D deficiency  - Vitamin D (25 hydroxy)  6. Gout  - Uric acid  7. Diabetic polyneuropathy associated with type 2 diabetes mellitus ([facility])  - HM DIABETES FOOT EXAM - LOW EXTREMITY NEUR EXAM DOCUM  8. CAD S/P percutaneous coronary angioplasty  9. OSA on CPAP  10. Screening for colorectal cancer  - POC Hemoccult Bld/Stl   11. Screening for ischemic heart disease  - EKG 12-Lead  12. Family history of ischemic heart disease  - EKG 12-Lead  13. Former smoker  - EKG 12-Lead  14. Medication management  - Microalbumin / Creatinine Urine Ratio - CBC with Diff - COMPLETE METABOLIC PANEL WITH GFR - Magnesium - Lipid Profile - TSH - Hemoglobin A1c ([facility]) - Vitamin D (25 hydroxy) - Urinalysis, Routine w reflex microscopic          Patient was counseled in prudent diet to achieve/maintain BMI less than 25 for weight control, CBG & BP monitoring, regular exercise and medications. Discussed med's effects and SE's. Screening labs and tests as requested with regular follow-up as recommended. Over 40 minutes of exam, counseling, chart review and high complex critical decision making was performed.   Kirtland Bouchard, MD

## 2019-04-30 ENCOUNTER — Other Ambulatory Visit: Payer: Self-pay | Admitting: Cardiovascular Disease

## 2019-04-30 ENCOUNTER — Ambulatory Visit (INDEPENDENT_AMBULATORY_CARE_PROVIDER_SITE_OTHER): Payer: PPO | Admitting: Internal Medicine

## 2019-04-30 ENCOUNTER — Other Ambulatory Visit: Payer: Self-pay

## 2019-04-30 VITALS — BP 136/82 | HR 80 | Temp 97.6°F | Resp 18 | Ht 61.75 in | Wt 245.6 lb

## 2019-04-30 DIAGNOSIS — Z794 Long term (current) use of insulin: Secondary | ICD-10-CM | POA: Diagnosis not present

## 2019-04-30 DIAGNOSIS — Z87891 Personal history of nicotine dependence: Secondary | ICD-10-CM

## 2019-04-30 DIAGNOSIS — Z1212 Encounter for screening for malignant neoplasm of rectum: Secondary | ICD-10-CM

## 2019-04-30 DIAGNOSIS — Z0000 Encounter for general adult medical examination without abnormal findings: Secondary | ICD-10-CM | POA: Diagnosis not present

## 2019-04-30 DIAGNOSIS — I10 Essential (primary) hypertension: Secondary | ICD-10-CM | POA: Diagnosis not present

## 2019-04-30 DIAGNOSIS — M109 Gout, unspecified: Secondary | ICD-10-CM | POA: Diagnosis not present

## 2019-04-30 DIAGNOSIS — N183 Chronic kidney disease, stage 3 unspecified: Secondary | ICD-10-CM | POA: Diagnosis not present

## 2019-04-30 DIAGNOSIS — E559 Vitamin D deficiency, unspecified: Secondary | ICD-10-CM | POA: Diagnosis not present

## 2019-04-30 DIAGNOSIS — E1169 Type 2 diabetes mellitus with other specified complication: Secondary | ICD-10-CM | POA: Diagnosis not present

## 2019-04-30 DIAGNOSIS — E1142 Type 2 diabetes mellitus with diabetic polyneuropathy: Secondary | ICD-10-CM

## 2019-04-30 DIAGNOSIS — Z79899 Other long term (current) drug therapy: Secondary | ICD-10-CM | POA: Diagnosis not present

## 2019-04-30 DIAGNOSIS — E785 Hyperlipidemia, unspecified: Secondary | ICD-10-CM

## 2019-04-30 DIAGNOSIS — Z8249 Family history of ischemic heart disease and other diseases of the circulatory system: Secondary | ICD-10-CM

## 2019-04-30 DIAGNOSIS — G4733 Obstructive sleep apnea (adult) (pediatric): Secondary | ICD-10-CM

## 2019-04-30 DIAGNOSIS — E1122 Type 2 diabetes mellitus with diabetic chronic kidney disease: Secondary | ICD-10-CM

## 2019-04-30 DIAGNOSIS — I2510 Atherosclerotic heart disease of native coronary artery without angina pectoris: Secondary | ICD-10-CM

## 2019-04-30 DIAGNOSIS — Z136 Encounter for screening for cardiovascular disorders: Secondary | ICD-10-CM | POA: Diagnosis not present

## 2019-04-30 DIAGNOSIS — IMO0002 Reserved for concepts with insufficient information to code with codable children: Secondary | ICD-10-CM

## 2019-04-30 DIAGNOSIS — Z1211 Encounter for screening for malignant neoplasm of colon: Secondary | ICD-10-CM

## 2019-04-30 DIAGNOSIS — Z0001 Encounter for general adult medical examination with abnormal findings: Secondary | ICD-10-CM

## 2019-04-30 DIAGNOSIS — E1165 Type 2 diabetes mellitus with hyperglycemia: Secondary | ICD-10-CM | POA: Diagnosis not present

## 2019-04-30 MED ORDER — BASAGLAR KWIKPEN 100 UNIT/ML ~~LOC~~ SOPN: PEN_INJECTOR | SUBCUTANEOUS | 3 refills | Status: DC

## 2019-05-01 LAB — COMPLETE METABOLIC PANEL WITH GFR
AG Ratio: 1.6 (calc) (ref 1.0–2.5)
ALT: 11 U/L (ref 6–29)
AST: 12 U/L (ref 10–35)
Albumin: 4.1 g/dL (ref 3.6–5.1)
Alkaline phosphatase (APISO): 99 U/L (ref 37–153)
BUN/Creatinine Ratio: 14 (calc) (ref 6–22)
BUN: 20 mg/dL (ref 7–25)
CO2: 28 mmol/L (ref 20–32)
Calcium: 9.5 mg/dL (ref 8.6–10.4)
Chloride: 102 mmol/L (ref 98–110)
Creat: 1.47 mg/dL — ABNORMAL HIGH (ref 0.60–0.93)
GFR, Est African American: 39 mL/min/{1.73_m2} — ABNORMAL LOW (ref >=60)
GFR, Est Non African American: 34 mL/min/{1.73_m2} — ABNORMAL LOW (ref >=60)
Globulin: 2.6 g/dL (calc) (ref 1.9–3.7)
Glucose, Bld: 123 mg/dL — ABNORMAL HIGH (ref 65–99)
Potassium: 4.2 mmol/L (ref 3.5–5.3)
Sodium: 142 mmol/L (ref 135–146)
Total Bilirubin: 0.7 mg/dL (ref 0.2–1.2)
Total Protein: 6.7 g/dL (ref 6.1–8.1)

## 2019-05-01 LAB — CBC WITH DIFFERENTIAL/PLATELET
Absolute Monocytes: 108 cells/uL — ABNORMAL LOW (ref 200–950)
Basophils Absolute: 10 cells/uL (ref 0–200)
Basophils Relative: 0.4 %
Eosinophils Absolute: 70 cells/uL (ref 15–500)
Eosinophils Relative: 2.9 %
HCT: 37.1 % (ref 35.0–45.0)
Hemoglobin: 12.6 g/dL (ref 11.7–15.5)
Lymphs Abs: 384 cells/uL — ABNORMAL LOW (ref 850–3900)
MCH: 32.2 pg (ref 27.0–33.0)
MCHC: 34 g/dL (ref 32.0–36.0)
MCV: 94.9 fL (ref 80.0–100.0)
MPV: 10.6 fL (ref 7.5–12.5)
Monocytes Relative: 4.5 %
Neutro Abs: 1829 cells/uL (ref 1500–7800)
Neutrophils Relative %: 76.2 %
Platelets: 141 10*3/uL (ref 140–400)
RBC: 3.91 10*6/uL (ref 3.80–5.10)
RDW: 12.8 % (ref 11.0–15.0)
Total Lymphocyte: 16 %
WBC: 2.4 10*3/uL — ABNORMAL LOW (ref 3.8–10.8)

## 2019-05-01 LAB — URINALYSIS, ROUTINE W REFLEX MICROSCOPIC
Bacteria, UA: NONE SEEN /HPF
Bilirubin Urine: NEGATIVE
Hgb urine dipstick: NEGATIVE
Hyaline Cast: NONE SEEN /LPF
Leukocytes,Ua: NEGATIVE
Nitrite: NEGATIVE
RBC / HPF: NONE SEEN /HPF (ref 0–2)
Specific Gravity, Urine: 1.028 (ref 1.001–1.03)
Squamous Epithelial / HPF: NONE SEEN /HPF (ref <=5)
pH: <=5 (ref 5.0–8.0)

## 2019-05-01 LAB — HEMOGLOBIN A1C
Hgb A1c MFr Bld: 8.9 % of total Hgb — ABNORMAL HIGH (ref <5.7)
Mean Plasma Glucose: 209 (calc)
eAG (mmol/L): 11.6 (calc)

## 2019-05-01 LAB — LIPID PANEL
Cholesterol: 145 mg/dL (ref <200)
HDL: 41 mg/dL — ABNORMAL LOW (ref >=50)
LDL Cholesterol (Calc): 67 mg/dL (calc)
Non-HDL Cholesterol (Calc): 104 mg/dL (calc) (ref <130)
Total CHOL/HDL Ratio: 3.5 (calc) (ref <5.0)
Triglycerides: 278 mg/dL — ABNORMAL HIGH (ref <150)

## 2019-05-01 LAB — MICROALBUMIN / CREATININE URINE RATIO
Creatinine, Urine: 181 mg/dL (ref 20–275)
Microalb Creat Ratio: 41 mcg/mg creat — ABNORMAL HIGH (ref <30)
Microalb, Ur: 7.5 mg/dL

## 2019-05-01 LAB — TSH: TSH: 1.79 mIU/L (ref 0.40–4.50)

## 2019-05-01 LAB — VITAMIN D 25 HYDROXY (VIT D DEFICIENCY, FRACTURES): Vit D, 25-Hydroxy: 51 ng/mL (ref 30–100)

## 2019-05-01 LAB — URIC ACID: Uric Acid, Serum: 5 mg/dL (ref 2.5–7.0)

## 2019-05-01 LAB — MAGNESIUM: Magnesium: 1.7 mg/dL (ref 1.5–2.5)

## 2019-05-02 ENCOUNTER — Encounter: Payer: Self-pay | Admitting: *Deleted

## 2019-05-21 ENCOUNTER — Other Ambulatory Visit: Payer: Self-pay | Admitting: Internal Medicine

## 2019-05-25 DIAGNOSIS — M65331 Trigger finger, right middle finger: Secondary | ICD-10-CM | POA: Diagnosis not present

## 2019-05-25 DIAGNOSIS — G5601 Carpal tunnel syndrome, right upper limb: Secondary | ICD-10-CM | POA: Diagnosis not present

## 2019-05-31 ENCOUNTER — Telehealth: Payer: Self-pay | Admitting: *Deleted

## 2019-05-31 NOTE — Telephone Encounter (Signed)
Patient called and questioned increase in Novolog 70/30 dose.  She is currently injecting 15 to 20 units before meals and the new refill increased to dose to 50 to 75 units 3 times a day with meals.  Per Dr Melford Aase, continue at the lower dosing.

## 2019-06-05 DIAGNOSIS — Z9889 Other specified postprocedural states: Secondary | ICD-10-CM | POA: Diagnosis not present

## 2019-06-07 ENCOUNTER — Other Ambulatory Visit: Payer: Self-pay | Admitting: Internal Medicine

## 2019-07-01 ENCOUNTER — Other Ambulatory Visit: Payer: Self-pay | Admitting: Internal Medicine

## 2019-07-02 ENCOUNTER — Encounter: Payer: Self-pay | Admitting: Physician Assistant

## 2019-07-02 ENCOUNTER — Ambulatory Visit (INDEPENDENT_AMBULATORY_CARE_PROVIDER_SITE_OTHER): Payer: PPO | Admitting: Physician Assistant

## 2019-07-02 ENCOUNTER — Other Ambulatory Visit: Payer: Self-pay

## 2019-07-02 VITALS — BP 120/66 | HR 86 | Temp 97.6°F

## 2019-07-02 DIAGNOSIS — F3341 Major depressive disorder, recurrent, in partial remission: Secondary | ICD-10-CM

## 2019-07-02 DIAGNOSIS — L299 Pruritus, unspecified: Secondary | ICD-10-CM

## 2019-07-02 MED ORDER — KETOCONAZOLE 2 % EX CREA: 1.0000 "application " | TOPICAL_CREAM | Freq: Two times a day (BID) | CUTANEOUS | 0 refills | Status: DC

## 2019-07-02 MED ORDER — ESCITALOPRAM OXALATE 20 MG PO TABS: 20.0000 mg | ORAL_TABLET | Freq: Every day | ORAL | 3 refills | Status: DC

## 2019-07-02 NOTE — Patient Instructions (Signed)
Use the cream 2x a week  Seborrheic Dermatitis, Adult  Seborrheic dermatitis is a skin disease that causes red, scaly patches and or itching.    Skin folds of the body.  Ears.  Eyebrows.  Neck.  Face.  Armpits.  The bearded area of men's faces. The condition may come and go for no known reason, and it is often long-lasting (chronic). What are the causes? The cause of this condition is not known. What increases the risk? This condition is more likely to develop in people who:  Have certain conditions, such as: ? HIV (human immunodeficiency virus). ? AIDS (acquired immunodeficiency syndrome). ? Parkinson disease. ? Mood disorders, such as depression.  Are 16-20 years old. What are the signs or symptoms? Symptoms of this condition include:  Thick scales on the scalp.  Redness on the face or in the armpits.  Skin that is flaky. The flakes may be white or yellow.  Skin that seems oily or dry but is not helped with moisturizers.  Itching or burning in the affected areas. How is this diagnosed? This condition is diagnosed with a medical history and physical exam. A sample of your skin may be tested (skin biopsy). You may need to see a skin specialist (dermatologist). How is this treated? There is no cure for this condition, but treatment can help to manage the symptoms. You may get treatment to remove scales, lower the risk of skin infection, and reduce swelling or itching. Treatment may include:  Creams that reduce swelling and irritation (steroids).  Creams that reduce skin yeast.  Medicated shampoo, soaps, moisturizing creams, or ointments.  Medicated moisturizing creams or ointments. Follow these instructions at home:  Apply over-the-counter and prescription medicines only as told by your health care provider.  Use any medicated shampoo, soaps, skin creams, or ointments only as told by your health care provider.  Keep all follow-up visits as told by your  health care provider. This is important. Contact a health care provider if:  Your symptoms do not improve with treatment.  Your symptoms get worse.  You have new symptoms. This information is not intended to replace advice given to you by your health care provider. Make sure you discuss any questions you have with your health care provider. Document Revised: 04/08/2017 Document Reviewed: 08/14/2015 Elsevier Patient Education  [place].

## 2019-07-02 NOTE — Progress Notes (Signed)
Subjective:    Patient ID: Janet Choi, female    DOB: 1940-12-19, 79 y.o.   MRN: [id number]  HPI 79 y.o. morbidly obese female with HTN, CAD, OSA, DM 2, CKD stage 2 presents with itching x end of Dec after her right hand surgery. She has been under a lot of stress.   Sold her house that Alveta Heimlich was living in and now fixing up her shed in the back for him, going to be able to pay off bills and that will make her feel better, has been stressing due to financial issues.   She states she has been having eye brow itching and her hair line itching, occ her head itching. No itching anywhere else.  She has been using hydrocortisone cream and benadryl.  She is on neurotinin.   Blood pressure 120/66, pulse 86, temperature 97.6 F (36.4 C), SpO2 96 %.  Medications  Current Outpatient Medications (Endocrine & Metabolic):  .  insulin aspart (NOVOLOG FLEXPEN) 100 UNIT/ML FlexPen, Inject 50  to 75 units 3 x /day before meals as directed for Diabetes .  Insulin Glargine (BASAGLAR KWIKPEN) 100 UNIT/ML SOPN, Inject 70 units Daily for Diabetes .  VICTOZA 18 MG/3ML SOPN, INJECT 0.3 MLS (1.8 MG TOTAL) INTO THE SKIN ONCE A WEEK FOR 12 DOSES.  Current Outpatient Medications (Cardiovascular):  .  atorvastatin (LIPITOR) 80 MG tablet, TAKE 1 TABLET (80 MG TOTAL) BY MOUTH DAILY AT 6 PM. .  furosemide (LASIX) 40 MG tablet, Take 40 mg by mouth as needed for fluid. Marland Kitchen  losartan (COZAAR) 50 MG tablet, Take 1 tablet Daily for BP & Diabetic Kidney Protection .  nitroGLYCERIN (NITROSTAT) 0.4 MG SL tablet, Place 1 tablet (0.4 mg total) under the tongue every 5 (five) minutes x 3 doses as needed for chest pain.   Current Outpatient Medications (Analgesics):  .  acetaminophen (TYLENOL) 325 MG tablet, Take 2 tablets (650 mg total) by mouth every 4 (four) hours as needed for headache or mild pain. Marland Kitchen  allopurinol (ZYLOPRIM) 100 MG tablet, TAKE 1 TABLET DAILY TO PREVENT GOUT .  aspirin EC 81 MG EC tablet, Take 1 tablet (81  mg total) by mouth daily. .  colchicine 0.6 MG tablet, Take 2 tablets first day of gout flare, then one a day until gout flare is better  Current Outpatient Medications (Hematological):  .  ferrous sulfate 325 (65 FE) MG tablet, Take 325 mg by mouth daily with breakfast. .  vitamin B-12 (CYANOCOBALAMIN) 1000 MCG tablet, Take 1,000 mcg by mouth daily.  Current Outpatient Medications (Other):  Marland Kitchen  buPROPion (WELLBUTRIN XL) 300 MG 24 hr tablet, Take 1 tablet (300 mg total) by mouth daily. Take 1 tablet Daily for Mood .  Cholecalciferol ([employer]S VIT D 5000 HIGH-POTENCY) 5000 units capsule, Take 5,000 Units by mouth 2 (two) times daily.  .  diclofenac sodium (VOLTAREN) 1 % GEL, Apply 4 g topically 4 (four) times daily. Marland Kitchen  escitalopram (LEXAPRO) 10 MG tablet, Take 1 tablet (10 mg total) by mouth daily. .  famotidine (PEPCID) 20 MG tablet, One at bedtime .  gabapentin (NEURONTIN) 100 MG capsule, Take 1 to 2 capsules 3 to 4 x /day as needed for Painful Diabetic Neuropathy .  Insulin Pen Needle (BD PEN NEEDLE NANO U/F) 32G X 4 MM MISC, USE AS DIRECTED 4 TIMES A DAY .  Lancets (ONETOUCH ULTRASOFT) lancets, Use as instructed .  ONETOUCH VERIO test strip, USE AS INSTRUCTED TO CHECK SUGAR 2 TIMES DAILY. Marland Kitchen  OVER THE COUNTER MEDICATION,  .  rOPINIRole (REQUIP) 3 MG tablet, Take 1 tablet 4 x /day for Restless Legs .  tiZANidine (ZANAFLEX) 4 MG tablet, TAKE 1/2 TO 1 TABLET 2 TO 3 X /DAY . . . ONLY . . . IF NEEDED FOR MUSCLE SPASMS  Problem list She has Dyslipidemia; Essential hypertension; Diverticulosis of large intestine; History of colonic polyps; Morbid obesity due to excess calories ([place]); Vitamin D deficiency; Medication management; RLS (restless legs syndrome); Type 2 diabetes mellitus with hyperlipidemia ([place]); OSA on CPAP; Iron deficiency anemia; History of non-ST elevation myocardial infarction (NSTEMI); CKD (chronic kidney disease), stage III ([place]); CAD S/P percutaneous coronary angioplasty; DOE (dyspnea  on exertion); Anxiety; DJD (degenerative joint disease), lumbosacral; Upper airway cough syndrome; Diabetic polyneuropathy associated with type 2 diabetes mellitus ([place]); and DJD (degenerative joint disease) of knee on their problem list.    Review of Systems  Constitutional: Negative.  Negative for chills and fever.  HENT: Negative.  Negative for congestion.   Respiratory: Negative.   Cardiovascular: Negative.   Genitourinary: Negative.   Musculoskeletal: Positive for arthralgias, back pain, gait problem and myalgias.  Skin: Negative for rash (states just her normal dry skin).       Objective:   Physical Exam Constitutional:      Appearance: She is obese. She is not ill-appearing.  HENT:     Right Ear: Tympanic membrane normal. There is no impacted cerumen.     Left Ear: Tympanic membrane normal. There is no impacted cerumen.     Ears:     Comments: No TMJ    Mouth/Throat:     Mouth: Mucous membranes are moist.     Pharynx: No oropharyngeal exudate or posterior oropharyngeal erythema.  Eyes:     Extraocular Movements: Extraocular movements intact.     Pupils: Pupils are equal, round, and reactive to light.  Cardiovascular:     Rate and Rhythm: Normal rate.  Pulmonary:     Effort: Pulmonary effort is normal.     Breath sounds: Normal breath sounds.     Comments: distant Abdominal:     General: Abdomen is protuberant.     Tenderness: There is no abdominal tenderness.  Skin:    Coloration: Skin is not jaundiced.     Comments: Scaling, dry skin around forehead/hair line, no rash  Neurological:     Mental Status: She is alert.             Assessment & Plan:   Future Appointments  Date Time Provider [place]  08/02/2019  3:00 PM Vicie Mutters, PA-C [facility] None  11/05/2019  2:30 PM [name], MD [facility] None  05/14/2020 11:00 AM [name], MD [facility] None

## 2019-07-12 DIAGNOSIS — Z9889 Other specified postprocedural states: Secondary | ICD-10-CM | POA: Diagnosis not present

## 2019-07-13 ENCOUNTER — Ambulatory Visit: Payer: Self-pay | Admitting: Physician Assistant

## 2019-07-24 ENCOUNTER — Other Ambulatory Visit: Payer: Self-pay

## 2019-07-24 ENCOUNTER — Observation Stay (HOSPITAL_COMMUNITY)
Admission: EM | Admit: 2019-07-24 | Discharge: 2019-07-25 | Disposition: A | Discharge status: Home / Self Care | Payer: PPO | Attending: Internal Medicine | Admitting: Internal Medicine

## 2019-07-24 ENCOUNTER — Encounter (HOSPITAL_COMMUNITY): Payer: Self-pay | Admitting: Emergency Medicine

## 2019-07-24 ENCOUNTER — Emergency Department (HOSPITAL_COMMUNITY): Payer: PPO

## 2019-07-24 ENCOUNTER — Other Ambulatory Visit: Payer: Self-pay | Admitting: Adult Health

## 2019-07-24 DIAGNOSIS — E1122 Type 2 diabetes mellitus with diabetic chronic kidney disease: Secondary | ICD-10-CM | POA: Diagnosis present

## 2019-07-24 DIAGNOSIS — Z20822 Contact with and (suspected) exposure to covid-19: Secondary | ICD-10-CM | POA: Insufficient documentation

## 2019-07-24 DIAGNOSIS — D61818 Other pancytopenia: Secondary | ICD-10-CM

## 2019-07-24 DIAGNOSIS — Z96653 Presence of artificial knee joint, bilateral: Secondary | ICD-10-CM | POA: Insufficient documentation

## 2019-07-24 DIAGNOSIS — Z79899 Other long term (current) drug therapy: Secondary | ICD-10-CM | POA: Insufficient documentation

## 2019-07-24 DIAGNOSIS — I255 Ischemic cardiomyopathy: Secondary | ICD-10-CM | POA: Diagnosis not present

## 2019-07-24 DIAGNOSIS — R0609 Other forms of dyspnea: Secondary | ICD-10-CM | POA: Diagnosis not present

## 2019-07-24 DIAGNOSIS — I129 Hypertensive chronic kidney disease with stage 1 through stage 4 chronic kidney disease, or unspecified chronic kidney disease: Secondary | ICD-10-CM | POA: Insufficient documentation

## 2019-07-24 DIAGNOSIS — Z87891 Personal history of nicotine dependence: Secondary | ICD-10-CM | POA: Insufficient documentation

## 2019-07-24 DIAGNOSIS — G4733 Obstructive sleep apnea (adult) (pediatric): Secondary | ICD-10-CM | POA: Diagnosis not present

## 2019-07-24 DIAGNOSIS — E1151 Type 2 diabetes mellitus with diabetic peripheral angiopathy without gangrene: Secondary | ICD-10-CM | POA: Insufficient documentation

## 2019-07-24 DIAGNOSIS — E1142 Type 2 diabetes mellitus with diabetic polyneuropathy: Secondary | ICD-10-CM | POA: Diagnosis not present

## 2019-07-24 DIAGNOSIS — Z7982 Long term (current) use of aspirin: Secondary | ICD-10-CM | POA: Insufficient documentation

## 2019-07-24 DIAGNOSIS — R05 Cough: Secondary | ICD-10-CM | POA: Diagnosis not present

## 2019-07-24 DIAGNOSIS — K219 Gastro-esophageal reflux disease without esophagitis: Secondary | ICD-10-CM | POA: Diagnosis not present

## 2019-07-24 DIAGNOSIS — D649 Anemia, unspecified: Secondary | ICD-10-CM | POA: Diagnosis not present

## 2019-07-24 DIAGNOSIS — I200 Unstable angina: Secondary | ICD-10-CM

## 2019-07-24 DIAGNOSIS — E1169 Type 2 diabetes mellitus with other specified complication: Secondary | ICD-10-CM | POA: Diagnosis present

## 2019-07-24 DIAGNOSIS — I10 Essential (primary) hypertension: Secondary | ICD-10-CM | POA: Diagnosis present

## 2019-07-24 DIAGNOSIS — F419 Anxiety disorder, unspecified: Secondary | ICD-10-CM | POA: Diagnosis not present

## 2019-07-24 DIAGNOSIS — Z794 Long term (current) use of insulin: Secondary | ICD-10-CM | POA: Insufficient documentation

## 2019-07-24 DIAGNOSIS — Z85820 Personal history of malignant melanoma of skin: Secondary | ICD-10-CM | POA: Insufficient documentation

## 2019-07-24 DIAGNOSIS — E559 Vitamin D deficiency, unspecified: Secondary | ICD-10-CM | POA: Insufficient documentation

## 2019-07-24 DIAGNOSIS — I252 Old myocardial infarction: Secondary | ICD-10-CM | POA: Diagnosis not present

## 2019-07-24 DIAGNOSIS — Z6841 Body Mass Index (BMI) 40.0 and over, adult: Secondary | ICD-10-CM | POA: Diagnosis not present

## 2019-07-24 DIAGNOSIS — F329 Major depressive disorder, single episode, unspecified: Secondary | ICD-10-CM | POA: Diagnosis not present

## 2019-07-24 DIAGNOSIS — E1165 Type 2 diabetes mellitus with hyperglycemia: Secondary | ICD-10-CM | POA: Diagnosis not present

## 2019-07-24 DIAGNOSIS — M109 Gout, unspecified: Secondary | ICD-10-CM | POA: Diagnosis not present

## 2019-07-24 DIAGNOSIS — G2581 Restless legs syndrome: Secondary | ICD-10-CM | POA: Diagnosis not present

## 2019-07-24 DIAGNOSIS — E785 Hyperlipidemia, unspecified: Secondary | ICD-10-CM | POA: Diagnosis not present

## 2019-07-24 DIAGNOSIS — N183 Chronic kidney disease, stage 3 unspecified: Secondary | ICD-10-CM

## 2019-07-24 DIAGNOSIS — R079 Chest pain, unspecified: Secondary | ICD-10-CM | POA: Diagnosis not present

## 2019-07-24 DIAGNOSIS — Z955 Presence of coronary angioplasty implant and graft: Secondary | ICD-10-CM | POA: Insufficient documentation

## 2019-07-24 DIAGNOSIS — I2510 Atherosclerotic heart disease of native coronary artery without angina pectoris: Secondary | ICD-10-CM | POA: Diagnosis not present

## 2019-07-24 DIAGNOSIS — Z85828 Personal history of other malignant neoplasm of skin: Secondary | ICD-10-CM | POA: Insufficient documentation

## 2019-07-24 DIAGNOSIS — R0789 Other chest pain: Secondary | ICD-10-CM | POA: Diagnosis not present

## 2019-07-24 DIAGNOSIS — R0602 Shortness of breath: Secondary | ICD-10-CM | POA: Diagnosis not present

## 2019-07-24 DIAGNOSIS — R0902 Hypoxemia: Secondary | ICD-10-CM | POA: Diagnosis not present

## 2019-07-24 DIAGNOSIS — I959 Hypotension, unspecified: Secondary | ICD-10-CM | POA: Diagnosis not present

## 2019-07-24 DIAGNOSIS — J449 Chronic obstructive pulmonary disease, unspecified: Secondary | ICD-10-CM

## 2019-07-24 LAB — CBC
HCT: 36.7 % (ref 36.0–46.0)
Hemoglobin: 11.4 g/dL — ABNORMAL LOW (ref 12.0–15.0)
MCH: 32.1 pg (ref 26.0–34.0)
MCHC: 31.1 g/dL (ref 30.0–36.0)
MCV: 103.4 fL — ABNORMAL HIGH (ref 80.0–100.0)
Platelets: 133 K/uL — ABNORMAL LOW (ref 150–400)
RBC: 3.55 MIL/uL — ABNORMAL LOW (ref 3.87–5.11)
RDW: 14.3 % (ref 11.5–15.5)
WBC: 2.4 K/uL — ABNORMAL LOW (ref 4.0–10.5)
nRBC: 0 % (ref 0.0–0.2)

## 2019-07-24 LAB — BASIC METABOLIC PANEL
Anion gap: 12 (ref 5–15)
BUN: 20 mg/dL (ref 8–23)
CO2: 26 mmol/L (ref 22–32)
Calcium: 9 mg/dL (ref 8.9–10.3)
Chloride: 99 mmol/L (ref 98–111)
Creatinine, Ser: 1.39 mg/dL — ABNORMAL HIGH (ref 0.44–1.00)
GFR calc Af Amer: 42 mL/min — ABNORMAL LOW (ref >60)
GFR calc non Af Amer: 36 mL/min — ABNORMAL LOW (ref >60)
Glucose, Bld: 281 mg/dL — ABNORMAL HIGH (ref 70–99)
Potassium: 4.6 mmol/L (ref 3.5–5.1)
Sodium: 137 mmol/L (ref 135–145)

## 2019-07-24 LAB — TROPONIN I (HIGH SENSITIVITY)
Troponin I (High Sensitivity): 9 ng/L (ref <18)
Troponin I (High Sensitivity): 10 ng/L

## 2019-07-24 LAB — BRAIN NATRIURETIC PEPTIDE: B Natriuretic Peptide: 37.1 pg/mL (ref 0.0–100.0)

## 2019-07-24 LAB — CBG MONITORING, ED: Glucose-Capillary: 179 mg/dL — ABNORMAL HIGH (ref 70–99)

## 2019-07-24 LAB — POC SARS CORONAVIRUS 2 AG -  ED: SARS Coronavirus 2 Ag: NEGATIVE

## 2019-07-24 MED ORDER — ATORVASTATIN CALCIUM 80 MG PO TABS
80.0000 mg | ORAL_TABLET | Freq: Every evening | ORAL | Status: DC
  Administered 2019-07-25: 80 mg via ORAL
  Filled 2019-07-24: qty 1

## 2019-07-24 MED ORDER — ENOXAPARIN SODIUM 40 MG/0.4ML ~~LOC~~ SOLN
40.0000 mg | SUBCUTANEOUS | Status: DC
  Administered 2019-07-25: 40 mg via SUBCUTANEOUS
  Filled 2019-07-24: qty 0.4

## 2019-07-24 MED ORDER — ESCITALOPRAM OXALATE 10 MG PO TABS
20.0000 mg | ORAL_TABLET | Freq: Every day | ORAL | Status: DC
  Administered 2019-07-25: 20 mg via ORAL
  Filled 2019-07-24: qty 2

## 2019-07-24 MED ORDER — ROPINIROLE HCL 1 MG PO TABS
3.0000 mg | ORAL_TABLET | Freq: Three times a day (TID) | ORAL | Status: DC
  Administered 2019-07-25 (×2): 3 mg via ORAL
  Filled 2019-07-24 (×5): qty 3

## 2019-07-24 MED ORDER — BUPROPION HCL ER (XL) 150 MG PO TB24
300.0000 mg | ORAL_TABLET | Freq: Every day | ORAL | Status: DC
  Administered 2019-07-25: 300 mg via ORAL
  Filled 2019-07-24: qty 2

## 2019-07-24 MED ORDER — SODIUM CHLORIDE 0.9% FLUSH: 3.0000 mL | Freq: Once | INTRAVENOUS | Status: DC

## 2019-07-24 MED ORDER — VITAMIN B-12 1000 MCG PO TABS
1000.0000 ug | ORAL_TABLET | Freq: Every day | ORAL | Status: DC
  Administered 2019-07-25: 1000 ug via ORAL
  Filled 2019-07-24: qty 1

## 2019-07-24 MED ORDER — SODIUM CHLORIDE 0.9 % IV SOLN: 250.0000 mL | INTRAVENOUS | Status: DC | PRN

## 2019-07-24 MED ORDER — ALLOPURINOL 100 MG PO TABS: 100.0000 mg | ORAL_TABLET | Freq: Every day | ORAL | Status: DC

## 2019-07-24 MED ORDER — INSULIN ASPART 100 UNIT/ML ~~LOC~~ SOLN: 0.0000 [IU] | Freq: Three times a day (TID) | SUBCUTANEOUS | Status: DC

## 2019-07-24 MED ORDER — ASPIRIN EC 81 MG PO TBEC
81.0000 mg | DELAYED_RELEASE_TABLET | Freq: Every day | ORAL | Status: DC
  Administered 2019-07-25: 81 mg via ORAL
  Filled 2019-07-24: qty 1

## 2019-07-24 MED ORDER — SODIUM CHLORIDE 0.9% FLUSH: 3.0000 mL | INTRAVENOUS | Status: DC | PRN

## 2019-07-24 MED ORDER — ACETAMINOPHEN 325 MG PO TABS: 650.0000 mg | ORAL_TABLET | ORAL | Status: DC | PRN

## 2019-07-24 MED ORDER — ZOLPIDEM TARTRATE 5 MG PO TABS: 5.0000 mg | ORAL_TABLET | Freq: Every evening | ORAL | Status: DC | PRN

## 2019-07-24 MED ORDER — INSULIN GLARGINE 100 UNIT/ML ~~LOC~~ SOLN
50.0000 [IU] | Freq: Every day | SUBCUTANEOUS | Status: DC
  Administered 2019-07-25: 50 [IU] via SUBCUTANEOUS
  Filled 2019-07-24 (×2): qty 0.5

## 2019-07-24 MED ORDER — SODIUM CHLORIDE 0.9% FLUSH
3.0000 mL | Freq: Two times a day (BID) | INTRAVENOUS | Status: DC
  Administered 2019-07-25 (×2): 3 mL via INTRAVENOUS

## 2019-07-24 MED ORDER — TIZANIDINE HCL 4 MG PO TABS: 2.0000 mg | ORAL_TABLET | Freq: Every evening | ORAL | Status: DC | PRN

## 2019-07-24 MED ORDER — HEPARIN BOLUS VIA INFUSION
4000.0000 [IU] | Freq: Once | INTRAVENOUS | Status: AC
  Administered 2019-07-24: 4000 [IU] via INTRAVENOUS
  Filled 2019-07-24: qty 4000

## 2019-07-24 MED ORDER — NITROGLYCERIN 0.4 MG SL SUBL
0.4000 mg | SUBLINGUAL_TABLET | SUBLINGUAL | Status: DC | PRN
  Administered 2019-07-24: 0.4 mg via SUBLINGUAL
  Filled 2019-07-24: qty 1

## 2019-07-24 MED ORDER — KETOCONAZOLE 2 % EX CREA
1.0000 "application " | TOPICAL_CREAM | Freq: Two times a day (BID) | CUTANEOUS | Status: DC
  Filled 2019-07-24: qty 15

## 2019-07-24 MED ORDER — INSULIN ASPART 100 UNIT/ML FLEXPEN: 20.0000 [IU] | PEN_INJECTOR | Freq: Three times a day (TID) | SUBCUTANEOUS | Status: DC

## 2019-07-24 MED ORDER — LOSARTAN POTASSIUM 50 MG PO TABS
50.0000 mg | ORAL_TABLET | Freq: Every day | ORAL | Status: DC
  Administered 2019-07-25: 50 mg via ORAL
  Filled 2019-07-24: qty 1

## 2019-07-24 MED ORDER — ALPRAZOLAM 0.25 MG PO TABS: 0.2500 mg | ORAL_TABLET | Freq: Two times a day (BID) | ORAL | Status: DC | PRN

## 2019-07-24 MED ORDER — HEPARIN (PORCINE) 25000 UT/250ML-% IV SOLN
1100.0000 [IU]/h | INTRAVENOUS | Status: DC
  Administered 2019-07-24: 1100 [IU]/h via INTRAVENOUS
  Filled 2019-07-24: qty 250

## 2019-07-24 MED ORDER — INSULIN ASPART 100 UNIT/ML ~~LOC~~ SOLN
20.0000 [IU] | Freq: Three times a day (TID) | SUBCUTANEOUS | Status: DC
  Administered 2019-07-25: 20 [IU] via SUBCUTANEOUS

## 2019-07-24 MED ORDER — POLYVINYL ALCOHOL 1.4 % OP SOLN
1.0000 [drp] | Freq: Every day | OPHTHALMIC | Status: DC | PRN
  Filled 2019-07-24: qty 15

## 2019-07-24 MED ORDER — BASAGLAR KWIKPEN 100 UNIT/ML ~~LOC~~ SOPN: 50.0000 [IU] | PEN_INJECTOR | Freq: Every day | SUBCUTANEOUS | Status: DC

## 2019-07-24 MED ORDER — FERROUS SULFATE 325 (65 FE) MG PO TABS
325.0000 mg | ORAL_TABLET | Freq: Every day | ORAL | Status: DC
  Administered 2019-07-25: 325 mg via ORAL
  Filled 2019-07-24: qty 1

## 2019-07-24 MED ORDER — ONDANSETRON HCL 4 MG/2ML IJ SOLN: 4.0000 mg | Freq: Four times a day (QID) | INTRAMUSCULAR | Status: DC | PRN

## 2019-07-24 MED ORDER — VITAMIN D 25 MCG (1000 UNIT) PO TABS
5000.0000 [IU] | ORAL_TABLET | Freq: Two times a day (BID) | ORAL | Status: DC
  Filled 2019-07-24: qty 5

## 2019-07-24 MED ORDER — NITROGLYCERIN 0.4 MG SL SUBL: 0.4000 mg | SUBLINGUAL_TABLET | SUBLINGUAL | Status: DC | PRN

## 2019-07-24 MED ORDER — POLYETHYLENE GLYCOL 400 0.25 % OP SOLN: 1.0000 [drp] | Freq: Every day | OPHTHALMIC | Status: DC | PRN

## 2019-07-24 MED ORDER — GABAPENTIN 100 MG PO CAPS
200.0000 mg | ORAL_CAPSULE | Freq: Four times a day (QID) | ORAL | Status: DC
  Administered 2019-07-25 (×2): 200 mg via ORAL
  Filled 2019-07-24 (×2): qty 2

## 2019-07-24 NOTE — ED Provider Notes (Signed)
[facility]ERG[facility] DE[facility]MENT Provider Note   CSN: [id number] Arrival date & time: 07/24/19  1520     History Chief Complaint  Patient presents with  . Chest Pain  . Shortness of Breath    Janet Choi is a 79 y.o. female history of coronary artery disease status post NSTEMI 2017 with DES to LAD to LCx, hypertension, hyperlipidemia, obesity, diabetes, presented to emergency department chest pain.  Patient reports that she has had intermittent episodes of chest pressure and shortness of breath ongoing for the past several days.  Beginning yesterday she began to feel "lousy".  She described this to me as a loss of appetite and general fatigue, combined with continued episodes of chest pain.  This morning she woke up and continued to feel awful, reporting a poor appetite and chest pressure this morning.  She took aspirin 81 mg x 3 at home, and called EMS.  They gave her SL nitro and another aspirin 81 mg x 2.  She feels like her chest pain rapidly improved after the nitro (within 5 minutes), but has not gone away.  It is better at rest, worse with ambulation to the bathroom.   She occasionally gets reflux but feels like this is different.  She denies any fevers chills or congestion.  Cardiologist is Dr Skeet Latch  HPI     Past Medical History:  Diagnosis Date  . Anemia    hx (04/14/2016)  . Anxiety   . Arthritis    "severe in my back; hands; ankles" (04/14/2016)  . Atypical chest pain 01/24/2018   Midline pain absent supine "constant" daytime since 12/31/17 > resolved as of 02/20/2018 on gerd/ gas diet   . Basal cell carcinoma    "several burned off; one cut off"  . CAD in native artery    a. NSTEMI 04/2016 - s/p DES toLAD and LCx. PCI to LCx notable for microembolization during cath.  . Chest pain- reslved with stopping Brilinta now on Plaix 04/16/2016  . Chronic lower back pain   . CKD (chronic kidney disease), stage III   . Depression   . DJD  (degenerative joint disease)   . Family history of adverse reaction to anesthesia    "half-sister used to get real sick" (04/14/2016)  . GERD (gastroesophageal reflux disease)   . History of gout   . Hyperlipidemia   . Hypertension   . IBS (irritable bowel syndrome)   . Ischemic cardiomyopathy    a. 04/2016: EF 40-50% by cath, 50-55% +WMA by echo.  . 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 ([place]) ~ 2015  . Morbid obesity ([place])   . NSTEMI (non-ST elevated myocardial infarction) ([place]) 04/14/2016  . OSA on CPAP   . Peripheral neuropathy   . Peripheral vascular disease ([place])   . RLS (restless legs syndrome)   . Spinal stenosis   . Type II diabetes mellitus ([place])   . Vitamin D deficiency     Patient Active Problem List   Diagnosis Date Noted  . Chest pain with moderate risk for cardiac etiology 07/24/2019  . DJD (degenerative joint disease) of knee 09/11/2018  . Diabetic polyneuropathy associated with type 2 diabetes mellitus ([place]) 06/27/2018  . Upper airway cough syndrome 01/24/2018  . DJD (degenerative joint disease), lumbosacral 06/02/2017  . DOE (dyspnea on exertion) 04/19/2016  . Anxiety 04/19/2016  . CAD S/P percutaneous coronary angioplasty   . History of non-ST elevation myocardial infarction (NSTEMI) 04/14/2016  . CKD (chronic  kidney disease), stage III 04/14/2016  . Iron deficiency anemia 08/15/2015  . OSA on CPAP 04/16/2015  . RLS (restless legs syndrome) 03/20/2014  . Type 2 diabetes mellitus with hyperlipidemia ([place]) 03/20/2014  . Medication management 09/12/2013  . Morbid obesity due to excess calories ([place]) 05/14/2013  . Vitamin D deficiency 05/14/2013  . Dyslipidemia 02/24/2009  . Essential hypertension 02/24/2009  . Diverticulosis of large intestine 02/24/2009  . History of colonic polyps 02/24/2009    Past Surgical History:  Procedure Laterality Date  . APPENDECTOMY    . BACK SURGERY    . BASAL CELL CARCINOMA EXCISION Left    leg  . CARDIAC  CATHETERIZATION N/A 04/15/2016   Procedure: Left Heart Cath and Coronary Angiography;  Surgeon: Belva Crome, MD;  Location: [facility];  Service: Cardiovascular;  Laterality: N/A;  . CARDIAC CATHETERIZATION N/A 04/15/2016   Procedure: Coronary Stent Intervention;  Surgeon: Belva Crome, MD;  Location: [facility];  Service: Cardiovascular;  Laterality: N/A;  Mid LAD Mid CFX  . CATARACT EXTRACTION, BILATERAL Bilateral   . JOINT REPLACEMENT    . LUMBAR DISC SURGERY  X 2  . MELANOMA EXCISION Left    "towards the back of my neck"  . SHOULDER ARTHROSCOPY W/ ROTATOR CUFF REPAIR Left   . SHOULDER OPEN ROTATOR CUFF REPAIR Right   . TOTAL KNEE ARTHROPLASTY Bilateral      OB History   No obstetric history on file.     Family History  Problem Relation Age of Onset  . Heart disease Mother   . Kidney disease Father   . AAA (abdominal aortic aneurysm) Father   . Heart disease Son   . Colon cancer Neg Hx   . Colon polyps Neg Hx   . Esophageal cancer Neg Hx   . Pancreatic cancer Neg Hx   . Stomach cancer Neg Hx   . Liver disease Neg Hx   . Diabetes Neg Hx     Social History   Tobacco Use  . Smoking status: Former Smoker    Packs/day: 2.50    Years: 25.00    Pack years: 62.50    Types: Cigarettes    Quit date: 11/11/1983    Years since quitting: 35.7  . Smokeless tobacco: Former Network engineer Use Topics  . Alcohol use: No    Alcohol/week: 0.0 standard drinks  . Drug use: No    Home Medications Prior to Admission medications   Medication Sig Start Date End Date Taking? Authorizing Provider  acetaminophen (TYLENOL) 325 MG tablet Take 2 tablets (650 mg total) by mouth every 4 (four) hours as needed for headache or mild pain. 04/20/16  Yes Isaiah Serge, NP  allopurinol (ZYLOPRIM) 100 MG tablet TAKE 1 TABLET DAILY TO PREVENT GOUT Patient taking differently: Take 100 mg by mouth daily.  03/11/19  Yes [name], MD  aspirin EC 81 MG EC tablet Take 1 tablet  (81 mg total) by mouth daily. 04/21/16  Yes Isaiah Serge, NP  atorvastatin (LIPITOR) 80 MG tablet TAKE 1 TABLET (80 MG TOTAL) BY MOUTH DAILY AT 6 PM. Patient taking differently: Take 80 mg by mouth every evening.  04/30/19  Yes Skeet Latch, MD  buPROPion (WELLBUTRIN XL) 300 MG 24 hr tablet Take 1 tablet (300 mg total) by mouth daily. Take 1 tablet Daily for Mood 03/10/19  Yes [name], MD  Cholecalciferol ([employer] VIT D 5000 HIGH-POTENCY) 5000 units capsule Take 5,000 Units by mouth  2 (two) times daily.    Yes [provider]  diclofenac sodium (VOLTAREN) 1 % GEL Apply 4 g topically 4 (four) times daily. Patient taking differently: Apply 4 g topically daily as needed (For had pain).  01/18/18  Yes Vicie Mutters, PA-C  escitalopram (LEXAPRO) 20 MG tablet Take 1 tablet (20 mg total) by mouth daily. Patient taking differently: Take 30 mg by mouth daily.  07/02/19 07/01/20 Yes Vicie Mutters, PA-C  ferrous sulfate 325 (65 FE) MG tablet Take 325 mg by mouth daily with breakfast.   Yes [provider]  furosemide (LASIX) 40 MG tablet Take 40 mg by mouth as needed for fluid.   Yes [provider]  gabapentin (NEURONTIN) 100 MG capsule Take 1 to 2 capsules 3 to 4 x /day as needed for Painful Diabetic Neuropathy Patient taking differently: Take 200 mg by mouth in the morning, at noon, in the evening, and at bedtime.  01/01/19  Yes [name], MD  insulin aspart (NOVOLOG FLEXPEN) 100 UNIT/ML FlexPen Inject 50  to 75 units 3 x /day before meals as directed for Diabetes Patient taking differently: Inject 20 Units into the skin 3 (three) times daily with meals. Inject 50  to 75 units 3 x /day before meals as directed for Diabetes 08/17/18  Yes [name], MD  Insulin Glargine (BASAGLAR KWIKPEN) 100 UNIT/ML SOPN Inject 70 units Daily for Diabetes Patient taking differently: Inject 70 Units into the skin at bedtime. Inject 70 units Daily for Diabetes 04/30/19  Yes  [name], MD  Insulin Pen Needle (BD PEN NEEDLE NANO U/F) 32G X 4 MM MISC USE AS DIRECTED 4 TIMES A DAY 02/22/19  Yes [name], MD  ketoconazole (NIZORAL) 2 % cream Apply 1 application topically 2 (two) times daily. 07/02/19  Yes Vicie Mutters, PA-C  Lancets [facility] ULTRASOFT) lancets Use as instructed 04/27/16  Yes Philemon Kingdom, MD  losartan (COZAAR) 50 MG tablet Take 1 tablet Daily for BP & Diabetic Kidney Protection Patient taking differently: Take 50 mg by mouth daily.  03/10/19  Yes [name], MD  nitroGLYCERIN (NITROSTAT) 0.4 MG SL tablet Place 1 tablet (0.4 mg total) under the tongue every 5 (five) minutes x 3 doses as needed for chest pain. 10/04/17  Yes [name], MD  [facility] VERIO test strip USE AS INSTRUCTED TO CHECK SUGAR 2 TIMES DAILY. 09/21/17  Yes Philemon Kingdom, MD  Polyethylene Glycol 400 (BLINK TEARS OP) Place 1 drop into both eyes daily as needed.   Yes [provider]  rOPINIRole (REQUIP) 3 MG tablet Take 1 tablet 4 x /day for Restless Legs Patient taking differently: Take 3 mg by mouth in the morning, at noon, in the evening, and at bedtime.  03/25/19  Yes [name], MD  tiZANidine (ZANAFLEX) 4 MG tablet TAKE 1/2 TO 1 TABLET 2 TO 3 X /DAY . . . ONLY . . . IF NEEDED FOR MUSCLE SPASMS Patient taking differently: Take 2 mg by mouth at bedtime as needed for muscle spasms.  07/02/19  Yes Liane Comber, NP  VICTOZA 18 MG/3ML SOPN INJECT 0.3 MLS (1.8 MG TOTAL) INTO THE SKIN ONCE A WEEK FOR 12 DOSES. Patient taking differently: Inject 1.8 mg into the skin once a week.  04/04/19  Yes Vicie Mutters, PA-C  vitamin B-12 (CYANOCOBALAMIN) 1000 MCG tablet Take 1,000 mcg by mouth daily.   Yes [provider]  colchicine 0.6 MG tablet Take 2 tablets first day of gout flare, then one a day  until gout flare is better Patient not taking: Reported on 07/24/2019 01/20/17 07/03/18  [name], MD  famotidine (PEPCID) 20 MG tablet  One at bedtime Patient not taking: Reported on 07/24/2019 01/23/18   Tanda Rockers, MD    Allergies    Brilinta [ticagrelor], Aspirin, Nsaids, Minocycline hcl, Oruvail [ketoprofen], Tricor [fenofibrate], Vasotec [enalaprilat], and Zinc  Review of Systems   Review of Systems  Constitutional: Positive for fatigue. Negative for chills and fever.  Respiratory: Positive for shortness of breath. Negative for cough.   Cardiovascular: Positive for chest pain. Negative for palpitations and leg swelling.  Gastrointestinal: Negative for abdominal pain and vomiting.  Musculoskeletal: Negative for arthralgias and back pain.  Skin: Negative for color change and rash.  Neurological: Negative for syncope and numbness.  Psychiatric/Behavioral: Negative for agitation and confusion.  All other systems reviewed and are negative.   Physical Exam Updated Vital Signs BP 122/65 Comment: test complete  Pulse 73   Temp 98.5 F (36.9 C) (Oral)   Resp 18   Ht 5\' 1"  (1.549 m)   Wt 111.1 kg   SpO2 95%   BMI 46.29 kg/m   Physical Exam Vitals and nursing note reviewed.  Constitutional:      General: She is not in acute distress.    Appearance: She is well-developed. She is obese.  HENT:     Head: Normocephalic and atraumatic.  Eyes:     Conjunctiva/sclera: Conjunctivae normal.  Cardiovascular:     Rate and Rhythm: Normal rate and regular rhythm.     Heart sounds: No murmur.  Pulmonary:     Effort: Pulmonary effort is normal. No respiratory distress.     Breath sounds: Normal breath sounds.  Abdominal:     Palpations: Abdomen is soft.     Tenderness: There is no abdominal tenderness.  Musculoskeletal:     Cervical back: Neck supple.  Skin:    General: Skin is warm and dry.  Neurological:     Mental Status: She is alert.     ED Results / Procedures / Treatments   Labs (all labs ordered are listed, but only abnormal results are displayed) Labs Reviewed  BASIC METABOLIC PANEL - Abnormal;  Notable for the following components:      Result Value   Glucose, Bld 281 (*)    Creatinine, Ser 1.39 (*)    GFR calc non Af Amer 36 (*)    GFR calc Af Amer 42 (*)    All other components within normal limits  CBC - Abnormal; Notable for the following components:   WBC 2.4 (*)    RBC 3.55 (*)    Hemoglobin 11.4 (*)    MCV 103.4 (*)    Platelets 133 (*)    All other components within normal limits  CBC - Abnormal; Notable for the following components:   WBC 2.6 (*)    RBC 3.31 (*)    Hemoglobin 10.7 (*)    HCT 34.2 (*)    MCV 103.3 (*)    Platelets 127 (*)    All other components within normal limits  COMPREHENSIVE METABOLIC PANEL - Abnormal; Notable for the following components:   Glucose, Bld 189 (*)    Creatinine, Ser 1.39 (*)    Total Protein 6.0 (*)    Albumin 3.2 (*)    AST 13 (*)    GFR calc non Af Amer 36 (*)    GFR calc Af Amer 42 (*)    All other components  within normal limits  CBC - Abnormal; Notable for the following components:   WBC 2.6 (*)    RBC 3.41 (*)    Hemoglobin 10.9 (*)    HCT 34.7 (*)    MCV 101.8 (*)    Platelets 129 (*)    All other components within normal limits  CREATININE, SERUM - Abnormal; Notable for the following components:   Creatinine, Ser 1.34 (*)    GFR calc non Af Amer 38 (*)    GFR calc Af Amer 44 (*)    All other components within normal limits  HEMOGLOBIN A1C - Abnormal; Notable for the following components:   Hgb A1c MFr Bld 9.5 (*)    All other components within normal limits  LIPID PANEL - Abnormal; Notable for the following components:   Triglycerides 255 (*)    HDL 34 (*)    VLDL 51 (*)    All other components within normal limits  CBG MONITORING, ED - Abnormal; Notable for the following components:   Glucose-Capillary 179 (*)    All other components within normal limits  CBG MONITORING, ED - Abnormal; Notable for the following components:   Glucose-Capillary 222 (*)    All other components within normal limits    CBG MONITORING, ED - Abnormal; Notable for the following components:   Glucose-Capillary 303 (*)    All other components within normal limits  SARS CORONAVIRUS 2 (TAT 6-24 HRS)  BRAIN NATRIURETIC PEPTIDE  POC SARS CORONAVIRUS 2 AG -  ED  TROPONIN I (HIGH SENSITIVITY)  TROPONIN I (HIGH SENSITIVITY)  TROPONIN I (HIGH SENSITIVITY)    EKG EKG Interpretation  Date/Time:  Tuesday July 24 2019 15:19:28 EDT Ventricular Rate:  87 PR Interval:  144 QRS Duration: 76 QT Interval:  366 QTC Calculation: 440 R Axis:   -56 Text Interpretation: Normal sinus rhythm Left axis deviation Low voltage QRS Possible Anterolateral infarct , age undetermined Abnormal ECG No significant change from Jun 2018 ecg No STEMI Confirmed by Octaviano Glow [phone]) on 07/24/2019 4:29:53 PM   Radiology DG Chest 2 View  Result Date: 07/24/2019 CLINICAL DATA:  Chest pressure.  Shortness of breath EXAM: CHEST - 2 VIEW COMPARISON:  01/10/2018 FINDINGS: There are few linear airspace opacities at the lung bases, similar to prior study and are favored to represent areas of atelectasis or scarring. The heart size is stable. There is no pneumothorax. No acute osseous abnormality. No large focal infiltrate. There are degenerative changes throughout the visualized thoracic spine. IMPRESSION: No active cardiopulmonary disease. Electronically Signed   By: Constance Holster M.D.   On: 07/24/2019 16:05   NM Myocar Multi W/Spect W/Wall Motion / EF  Result Date: 07/25/2019 CLINICAL DATA:  79 year old female with chest pain. EXAM: MYOCARDIAL IMAGING WITH SPECT (REST AND PHARMACOLOGIC-STRESS) GATED LEFT VENTRICULAR WALL MOTION STUDY LEFT VENTRICULAR EJECTION FRACTION TECHNIQUE: Standard myocardial SPECT imaging was performed after resting intravenous injection of 10 mCi Tc-42m tetrofosmin. Subsequently, intravenous infusion of Lexiscan was performed under the supervision of the Cardiology staff. At peak effect of the drug, 30 mCi Tc-52m  tetrofosmin was injected intravenously and standard myocardial SPECT imaging was performed. Quantitative gated imaging was also performed to evaluate left ventricular wall motion, and estimate left ventricular ejection fraction. COMPARISON:  None. FINDINGS: Perfusion: A small focus of mild decreased perfusion in the apical and mid segment the anterior septal wall which improves from stress to rest. No additional decreased perfusion identified. Wall Motion: Normal left ventricular wall motion. No left ventricular  dilation. Left Ventricular Ejection Fraction: 56 % End diastolic volume [id number] ml End systolic volume 40 ml IMPRESSION: 1. Small focus of mild ischemia in the anterior septal wall. No additional evidence of reversible ischemia. No evidence of infarction. 2. Normal left ventricular wall motion. 3. Left ventricular ejection fraction 56% 4. Non invasive risk stratification*: Low to intermediate *2012 Appropriate Use Criteria for Coronary Revascularization Focused Update: J Am Coll Cardiol. N6492421. http://content.airportbarriers.com.aspx?[email or web address] Electronically Signed   By: Suzy Bouchard M.D.   On: 07/25/2019 12:06    Procedures .Critical Care Performed by: Wyvonnia Dusky, MD Authorized by: Wyvonnia Dusky, MD   Critical care provider statement:    Critical care time (minutes):  35   Critical care was necessary to treat or prevent imminent or life-threatening deterioration of the following conditions:  Circulatory failure   Critical care was time spent personally by me on the following activities:  Discussions with consultants, evaluation of patient's response to treatment, examination of patient, ordering and performing treatments and interventions, ordering and review of laboratory studies, ordering and review of radiographic studies, pulse oximetry, re-evaluation of patient's condition, obtaining history from patient or surrogate and review of old charts Comments:      Unstable angina requiring IV heparin, frequent bedside reassessments, discussion with cardiology consultants   (including critical care time)  Medications Ordered in ED Medications  sodium chloride flush (NS) 0.9 % injection 3 mL (3 mLs Intravenous Not Given 07/24/19 1618)  nitroGLYCERIN (NITROSTAT) SL tablet 0.4 mg (0.4 mg Sublingual Given 07/24/19 1658)  nitroGLYCERIN (NITROSTAT) SL tablet 0.4 mg (has no administration in time range)  acetaminophen (TYLENOL) tablet 650 mg (has no administration in time range)  ondansetron (ZOFRAN) injection 4 mg (has no administration in time range)  zolpidem (AMBIEN) tablet 5 mg (has no administration in time range)  enoxaparin (LOVENOX) injection 40 mg (40 mg Subcutaneous Given 07/25/19 0021)  sodium chloride flush (NS) 0.9 % injection 3 mL (3 mLs Intravenous Given 07/25/19 1208)  sodium chloride flush (NS) 0.9 % injection 3 mL (has no administration in time range)  0.9 %  sodium chloride infusion (has no administration in time range)  ALPRAZolam (XANAX) tablet 0.25 mg (has no administration in time range)  insulin aspart (novoLOG) injection 0-15 Units (0 Units Subcutaneous Refused 07/25/19 1205)  allopurinol (ZYLOPRIM) tablet 100 mg (100 mg Oral Not Given 07/25/19 1207)  aspirin EC tablet 81 mg (81 mg Oral Given 07/25/19 1159)  atorvastatin (LIPITOR) tablet 80 mg (80 mg Oral Given 07/25/19 0319)  buPROPion (WELLBUTRIN XL) 24 hr tablet 300 mg (300 mg Oral Given 07/25/19 1159)  cholecalciferol (VITAMIN D3) tablet 5,000 Units (5,000 Units Oral Refused 07/25/19 1207)  escitalopram (LEXAPRO) tablet 20 mg (20 mg Oral Given 07/25/19 1159)  ferrous sulfate tablet 325 mg (325 mg Oral Given 07/25/19 1158)  gabapentin (NEURONTIN) capsule 200 mg (200 mg Oral Refused 07/25/19 1206)  ketoconazole (NIZORAL) 2 % cream 1 application (1 application Topical Refused 07/25/19 1206)  losartan (COZAAR) tablet 50 mg (50 mg Oral Given 07/25/19 1158)  rOPINIRole (REQUIP) tablet 3 mg (3 mg  Oral Not Given 07/25/19 1208)  tiZANidine (ZANAFLEX) tablet 2 mg (has no administration in time range)  vitamin B-12 (CYANOCOBALAMIN) tablet 1,000 mcg (1,000 mcg Oral Given 07/25/19 1159)  insulin aspart (novoLOG) injection 20 Units (20 Units Subcutaneous Given 07/25/19 1201)  polyvinyl alcohol (LIQUIFILM TEARS) 1.4 % ophthalmic solution 1 drop (has no administration in time range)  insulin glargine (LANTUS) injection 50 Units (50 Units  Subcutaneous Given 07/25/19 0021)  heparin bolus via infusion 4,000 Units (4,000 Units Intravenous Bolus from Bag 07/24/19 1914)  technetium tetrofosmin (TC-MYOVIEW) injection [id number] millicurie ([id number] millicuries Intravenous Contrast Given 07/25/19 0845)  regadenoson (LEXISCAN) injection SOLN 0.4 mg (0.4 mg Intravenous Given 07/25/19 1011)  technetium tetrofosmin (TC-MYOVIEW) injection [id number] millicurie ([id number] millicuries Intravenous Contrast Given 07/25/19 1030)    ED Course  I have reviewed the triage vital signs and the nursing notes.  Pertinent labs & imaging results that were available during my care of the patient were reviewed by me and considered in my medical decision making (see chart for details).  79 yo female w/ significant cardiac hx (2 stents placed in 2017), multiple cardiac risk factors, presenting to ED with persistent chest discomfort and SOB for past week.  At first was intermittent, but now becoming more persistent. Presentation concerning for unstable angina.  She responded quickly and dramatically to SL nitro given by EMS and again in the ED.  Minimal pain currently in the ED.  She received full dose aspirin.  No STEMI on ECG.  Trop 9 -> 10.  We'll start heparin for unstable angina.  Less likely PE, PTX, or PNA given her clinical presentation and workup.  POC covid negative.  Hgb > 10, not consistent with symptomatic anemia.  [facility] Choi was evaluated in Emergency Department on 07/25/2019 for the symptoms described in the history of present illness.  She was evaluated in the context of the global COVID-19 pandemic, which necessitated consideration that the patient might be at risk for infection with the SARS-CoV-2 virus that causes COVID-19. Institutional protocols and algorithms that pertain to the evaluation of patients at risk for COVID-19 are in a state of rapid change based on information released by regulatory bodies including the CDC and federal and state organizations. These policies and algorithms were followed during the patient's care in the ED.   Clinical Course as of Jul 24 1298  Tue Jul 24, 2019  1753 Improvement of chest pressure with SL nitro.  Will discuss with cardiology, hx concerning for unstable angina.   [MT]  1833 Spoke to cardiologist dr turner who will have PA come see patient   [MT]    Clinical Course User Index [MT] Amilyah Nack, Carola Rhine, MD   Final Clinical Impression(s) / ED Diagnoses Final diagnoses:  Unstable angina [facility])    Rx / DC Orders ED Discharge Orders    None       Wyvonnia Dusky, MD 07/25/19 1303

## 2019-07-24 NOTE — ED Triage Notes (Signed)
Arrived via EMS from home developed chest pain and shortness of breath 3 days ago. Took home Nitro 0.4mg  with some relief. EMS administered along with patient took total of five 81mg  tablets of aspirin. EMS gave an additional Nitro 0.4 mg. Currently chest pain pressure 4/10 with SOB improvement.

## 2019-07-24 NOTE — H&P (Signed)
Cardiology Admission History and Physical:   Patient ID: Janet Choi; MRN: [id number]; DOB: August 01, 1940   Admission date: 07/24/2019  Primary Care Provider: [name], MD Primary Cardiologist: Skeet Latch, MD 07/03/2018 Primary Electrophysiologist: None    Chief Complaint:  Chest pain  Patient Profile:   Janet Choi is a 79 y.o. female with a history of NSTEMI 2017 with PCI of the LAD and LCx, hypertensive heart disease, OSA, DM, HTN, HLD, morbid obesity, IBS, GERD, and CKD III.  History of Present Illness:   Janet Choi admits that she has not been taking very good care of herself since Covid started.  She had been walking, and doing okay with this.  She quit that.  Her exertional level has been poor, she admits that she sits most of the day.  Her diet has been poor, she admits to eating mostly snacks.  She has gained some weight, she is up about 10 pounds in the last year.  She has daytime lower extremity edema, but says she does not wake with it.  She snores and has been told she has sleep apnea, but says she is not been able to wear the mask.  She also has not been able to wear oxygen at night which was also the plan at one time.  According to Janet Choi, her oxygen saturation will drop down to 88% when she is relaxed.  Approximately 3 days ago, she developed chest pain.  It was a pressure and she took a nitro.  That day, the chest pain improved and resolved.  She has been coughing, coughing up occasional clear phlegm.  She has not had fevers or chills.  She has not lost her sense of taste or smell.  Her appetite has been poor.  When she walks or exerts herself, she will hear herself wheeze.  Yesterday, she again developed chest pain and just did not feel well in general.  The chest pain remained continuous at at least a 2/10, sometimes more intense.    Today, the chest pain became more severe.  It reached a 9/10.  She can feel it a little bit more with deep inspiration,  but states it does not change with coughing.  When the pain became more severe, she tried nitroglycerin for.  She feels the nitroglycerin helped.  She became concerned because of the severity and intensity of the pain.  She called EMS.  She received aspirin 81 mg x 4 and 2 sublingual nitroglycerin.  The pain dropped down to a 2/10.  She received another sublingual nitroglycerin in the emergency room.  Currently her chest pain is a 2/10.  It has been at least a 2/10 for over 24 hours.   Past Medical History:  Diagnosis Date  . Anemia    hx (04/14/2016)  . Anxiety   . Arthritis    "severe in my back; hands; ankles" (04/14/2016)  . Atypical chest pain 01/24/2018   Midline pain absent supine "constant" daytime since 12/31/17 > resolved as of 02/20/2018 on gerd/ gas diet   . Basal cell carcinoma    "several burned off; one cut off"  . CAD in native artery    a. NSTEMI 04/2016 - s/p DES toLAD and LCx. PCI to LCx notable for microembolization during cath.  . Chest pain- reslved with stopping Brilinta now on Plaix 04/16/2016  . Chronic lower back pain   . CKD (chronic kidney disease), stage III   . Depression   . DJD (degenerative  joint disease)   . Family history of adverse reaction to anesthesia    "half-sister used to get real sick" (04/14/2016)  . GERD (gastroesophageal reflux disease)   . History of gout   . Hyperlipidemia   . Hypertension   . IBS (irritable bowel syndrome)   . Ischemic cardiomyopathy    a. 04/2016: EF 40-50% by cath, 50-55% +WMA by echo.  . 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 ([place]) ~ 2015  . Morbid obesity ([place])   . NSTEMI (non-ST elevated myocardial infarction) ([place]) 04/14/2016  . OSA on CPAP   . Peripheral neuropathy   . Peripheral vascular disease ([place])   . RLS (restless legs syndrome)   . Spinal stenosis   . Type II diabetes mellitus ([place])   . Vitamin D deficiency     Past Surgical History:  Procedure Laterality Date  . APPENDECTOMY    . BACK SURGERY     . BASAL CELL CARCINOMA EXCISION Left    leg  . CARDIAC CATHETERIZATION N/A 04/15/2016   Procedure: Left Heart Cath and Coronary Angiography;  Surgeon: Belva Crome, MD;  Location: [facility];  Service: Cardiovascular;  Laterality: N/A;  . CARDIAC CATHETERIZATION N/A 04/15/2016   Procedure: Coronary Stent Intervention;  Surgeon: Belva Crome, MD;  Location: [facility];  Service: Cardiovascular;  Laterality: N/A;  Mid LAD Mid CFX  . CATARACT EXTRACTION, BILATERAL Bilateral   . JOINT REPLACEMENT    . LUMBAR DISC SURGERY  X 2  . MELANOMA EXCISION Left    "towards the back of my neck"  . SHOULDER ARTHROSCOPY W/ ROTATOR CUFF REPAIR Left   . SHOULDER OPEN ROTATOR CUFF REPAIR Right   . TOTAL KNEE ARTHROPLASTY Bilateral      Medications Prior to Admission: Prior to Admission medications   Medication Sig Start Date End Date Taking? Authorizing Provider  acetaminophen (TYLENOL) 325 MG tablet Take 2 tablets (650 mg total) by mouth every 4 (four) hours as needed for headache or mild pain. 04/20/16  Yes Isaiah Serge, NP  allopurinol (ZYLOPRIM) 100 MG tablet TAKE 1 TABLET DAILY TO PREVENT GOUT Patient taking differently: Take 100 mg by mouth daily.  03/11/19  Yes [name], MD  aspirin EC 81 MG EC tablet Take 1 tablet (81 mg total) by mouth daily. 04/21/16  Yes Isaiah Serge, NP  atorvastatin (LIPITOR) 80 MG tablet TAKE 1 TABLET (80 MG TOTAL) BY MOUTH DAILY AT 6 PM. Patient taking differently: Take 80 mg by mouth every evening.  04/30/19  Yes Skeet Latch, MD  buPROPion (WELLBUTRIN XL) 300 MG 24 hr tablet Take 1 tablet (300 mg total) by mouth daily. Take 1 tablet Daily for Mood 03/10/19  Yes [name], MD  Cholecalciferol ([employer] VIT D 5000 HIGH-POTENCY) 5000 units capsule Take 5,000 Units by mouth 2 (two) times daily.    Yes [provider]  diclofenac sodium (VOLTAREN) 1 % GEL Apply 4 g topically 4 (four) times daily. Patient taking differently: Apply 4 g  topically daily as needed (For had pain).  01/18/18  Yes Vicie Mutters, PA-C  escitalopram (LEXAPRO) 20 MG tablet Take 1 tablet (20 mg total) by mouth daily. Patient taking differently: Take 30 mg by mouth daily.  07/02/19 07/01/20 Yes Vicie Mutters, PA-C  ferrous sulfate 325 (65 FE) MG tablet Take 325 mg by mouth daily with breakfast.   Yes [provider]  furosemide (LASIX) 40 MG tablet Take 40 mg by mouth as needed  for fluid.   Yes [provider]  gabapentin (NEURONTIN) 100 MG capsule Take 1 to 2 capsules 3 to 4 x /day as needed for Painful Diabetic Neuropathy Patient taking differently: Take 200 mg by mouth in the morning, at noon, in the evening, and at bedtime.  01/01/19  Yes [name], MD  insulin aspart (NOVOLOG FLEXPEN) 100 UNIT/ML FlexPen Inject 50  to 75 units 3 x /day before meals as directed for Diabetes Patient taking differently: Inject 20 Units into the skin 3 (three) times daily with meals. Inject 50  to 75 units 3 x /day before meals as directed for Diabetes 08/17/18  Yes [name], MD  Insulin Glargine (BASAGLAR KWIKPEN) 100 UNIT/ML SOPN Inject 70 units Daily for Diabetes Patient taking differently: Inject 70 Units into the skin at bedtime. Inject 70 units Daily for Diabetes 04/30/19  Yes [name], MD  Insulin Pen Needle (BD PEN NEEDLE NANO U/F) 32G X 4 MM MISC USE AS DIRECTED 4 TIMES A DAY 02/22/19  Yes [name], MD  ketoconazole (NIZORAL) 2 % cream Apply 1 application topically 2 (two) times daily. 07/02/19  Yes Vicie Mutters, PA-C  Lancets [facility] ULTRASOFT) lancets Use as instructed 04/27/16  Yes Philemon Kingdom, MD  losartan (COZAAR) 50 MG tablet Take 1 tablet Daily for BP & Diabetic Kidney Protection Patient taking differently: Take 50 mg by mouth daily.  03/10/19  Yes [name], MD  nitroGLYCERIN (NITROSTAT) 0.4 MG SL tablet Place 1 tablet (0.4 mg total) under the tongue every 5 (five) minutes x 3 doses as needed  for chest pain. 10/04/17  Yes [name], MD  [facility] VERIO test strip USE AS INSTRUCTED TO CHECK SUGAR 2 TIMES DAILY. 09/21/17  Yes Philemon Kingdom, MD  Polyethylene Glycol 400 (BLINK TEARS OP) Place 1 drop into both eyes daily as needed.   Yes [provider]  rOPINIRole (REQUIP) 3 MG tablet Take 1 tablet 4 x /day for Restless Legs Patient taking differently: Take 3 mg by mouth in the morning, at noon, in the evening, and at bedtime.  03/25/19  Yes [name], MD  tiZANidine (ZANAFLEX) 4 MG tablet TAKE 1/2 TO 1 TABLET 2 TO 3 X /DAY . . . ONLY . . . IF NEEDED FOR MUSCLE SPASMS Patient taking differently: Take 2 mg by mouth at bedtime as needed for muscle spasms.  07/02/19  Yes Liane Comber, NP  VICTOZA 18 MG/3ML SOPN INJECT 0.3 MLS (1.8 MG TOTAL) INTO THE SKIN ONCE A WEEK FOR 12 DOSES. Patient taking differently: Inject 1.8 mg into the skin once a week.  04/04/19  Yes Vicie Mutters, PA-C  vitamin B-12 (CYANOCOBALAMIN) 1000 MCG tablet Take 1,000 mcg by mouth daily.   Yes [provider]  colchicine 0.6 MG tablet Take 2 tablets first day of gout flare, then one a day until gout flare is better Patient not taking: Reported on 07/24/2019 01/20/17 07/03/18  [name], MD  famotidine (PEPCID) 20 MG tablet One at bedtime Patient not taking: Reported on 07/24/2019 01/23/18   Tanda Rockers, MD     Allergies:    Allergies  Allergen Reactions  . Brilinta [Ticagrelor] Shortness Of Breath and Other (See Comments)    Chest pain (also)  . Aspirin Other (See Comments)    Can tolerate in small doses (is already on a blood thinner AND has kidney disease)  . Nsaids Other (See Comments)    Patient is taking a blood thinner and has kidney disease  .  Minocycline Hcl Other (See Comments)    Welts  . Oruvail [Ketoprofen] Other (See Comments)    Has kidney disease and is taking a blood thinner  . Tricor [Fenofibrate]     Reaction unknown  . Vasotec [Enalaprilat]      Reaction unknown  . Zinc     Reaction unknown    Social History:   Social History   Socioeconomic History  . Marital status: Widowed    Spouse name: Not on file  . Number of children: Not on file  . Years of education: Not on file  . Highest education level: Not on file  Occupational History  . Not on file  Tobacco Use  . Smoking status: Former Smoker    Packs/day: 2.50    Years: 25.00    Pack years: 62.50    Types: Cigarettes    Quit date: 11/11/1983    Years since quitting: 35.7  . Smokeless tobacco: Former Network engineer and Sexual Activity  . Alcohol use: No    Alcohol/week: 0.0 standard drinks  . Drug use: No  . Sexual activity: Never  Other Topics Concern  . Not on file  Social History Narrative  . Not on file   Social Determinants of Health   Financial Resource Strain:   . Difficulty of Paying Living Expenses:   Food Insecurity:   . Worried About Charity fundraiser in the Last Year:   . Arboriculturist in the Last Year:   Transportation Needs:   . Film/video editor (Medical):   Marland Kitchen Lack of Transportation (Non-Medical):   Physical Activity:   . Days of Exercise per Week:   . Minutes of Exercise per Session:   Stress:   . Feeling of Stress :   Social Connections:   . Frequency of Communication with Friends and Family:   . Frequency of Social Gatherings with Friends and Family:   . Attends Religious Services:   . Active Member of Clubs or Organizations:   . Attends Archivist Meetings:   Marland Kitchen Marital Status:   Intimate Partner Violence:   . Fear of Current or Ex-Partner:   . Emotionally Abused:   Marland Kitchen Physically Abused:   . Sexually Abused:     Family History:  The patient's family history includes AAA (abdominal aortic aneurysm) in her father; Heart disease in her mother and son; Kidney disease in her father. There is no history of Colon cancer, Colon polyps, Esophageal cancer, Pancreatic cancer, Stomach cancer, Liver disease, or Diabetes.     The patient She indicated that her mother is deceased. She indicated that her father is deceased. She indicated that her brother is deceased. She indicated that her maternal grandmother is deceased. She indicated that her maternal grandfather is deceased. She indicated that her paternal grandmother is deceased. She indicated that her paternal grandfather is deceased. She indicated that the status of her son is unknown. She indicated that the status of her neg hx is unknown.    ROS:  Please see the history of present illness.  All other ROS reviewed and negative.     Physical Exam/Data:   Vitals:   07/24/19 1518 07/24/19 1520 07/24/19 1645 07/24/19 1700  BP:  (!) 150/77 123/61 119/61  Pulse:  86 82 82  Resp:  18 (!) 26 18  Temp:  98.5 F (36.9 C)    TempSrc:  Oral    SpO2:  93% 93% 94%  Weight: 111.1 kg  Height: 5\' 1"  (1.549 m)      No intake or output data in the 24 hours ending 07/24/19 1929 Filed Weights   07/24/19 1518  Weight: 111.1 kg   Body mass index is 46.29 kg/m.  General:  Well nourished, morbidly obese, female in no acute distress at rest HEENT: normal Lymph: no adenopathy Neck:  JVD not elevated, but assessment difficult due to body habitus Endocrine:  No thryomegaly Vascular: No carotid bruits; 4/4 extremity pulses 2+ bilaterally  Cardiac:  normal S1, S2; RRR; no murmur, no rub or gallop  Lungs: Decreased breath sounds bases bilaterally, no wheezing, rhonchi or rales; there is a tender area at the right side of the sternum that reproduces her pain Abd: soft, nontender, no hepatomegaly  Ext: Trace edema Musculoskeletal:  No deformities, BUE and BLE strength normal and equal Skin: warm and dry  Neuro:  CNs 2-12 intact, no focal abnormalities noted Psych:  Normal affect    EKG:  The ECG was personally reviewed: 3/16 ECG is sinus rhythm, heart rate 87, no ST elevation or depression, loss of R waves across the precordial leads is old Telemetry: Sinus  rhythm  Relevant CV Studies:  ECHO: 04/15/2016 - Left ventricle: The cavity size was normal. There was moderate  focal basal and mild concentric hypertrophy. Systolic function  was normal. The estimated ejection fraction was in the range of  50% to 55%. There is akinesis of the distal septal and apical  myocardium. There is akinesis of the apicalinferior myocardium.  There was an increased relative contribution of atrial  contraction to ventricular filling. Doppler parameters are  consistent with abnormal left ventricular relaxation (grade 1  diastolic dysfunction). Doppler parameters are consistent with  high ventricular filling pressure.  - Aortic valve: Mildly to moderately calcified annulus. Mildly  calcified leaflets.  - Mitral valve: Calcified annulus. Mildly calcified leaflets .  There was trivial regurgitation.  - Atrial septum: No defect or patent foramen ovale was identified.   CATH: 04/15/2016  Non-ST elevation myocardial infarction with identification of [id number] segmental mid LAD stenosis and focal eccentric 95-99% mid circumflex stenosis.  The first diagonal is small and contains segmental 80% stenosis. The coronaries are otherwise widely patent.  Successful drug-eluting stent using Onyx Resolute to reduce a 95% mid LAD stenosis to 0% with TIMI grade 3 flow. Final stent diameter 3.0 mm.  Successful drug-eluting stent using Onyx Resolute to reduce a 95% mid circumflex stenosis to 0% with TIMI grade 3 flow. Post stent diameter 3.5 mm. Intervention on this vessel was, complicated by microembolization to the the most distal obtuse marginal beyond a bifurcation.  Mild hypokinesis of the anterior wall. EF 40-50%.  RECOMMENDATIONS:   Brilinta and aspirin times at least 12 months.  We discussed high-intensity statin therapy. She is willing to restart therapy.  Aggrastat infusion for 18 hours due to microembolization.  IV nitroglycerin for 12  hours.  Potential discharge tomorrow assuming stable clinical condition without significant rebound and cardiac markers. Diagnostic Dominance: Co-dominant  Intervention     Laboratory Data:  Chemistry Recent Labs  Lab 07/24/19 1521  NA 137  K 4.6  CL 99  CO2 26  GLUCOSE 281*  BUN 20  CREATININE 1.39*  CALCIUM 9.0  GFRNONAA 36*  GFRAA 42*  ANIONGAP 12    No results for input(s): PROT, ALBUMIN, AST, ALT, ALKPHOS, BILITOT in the last 168 hours. Hematology Recent Labs  Lab 07/24/19 1521  WBC 2.4*  RBC 3.55*  HGB 11.4*  HCT 36.7  MCV 103.4*  MCH 32.1  MCHC 31.1  RDW 14.3  PLT 133*   Cardiac Enzymes  High Sensitivity Troponin:   Recent Labs  Lab 07/24/19 1521 07/24/19 1721  TROPONINIHS 9 10     BNPNo results for input(s): BNP, PROBNP in the last 168 hours.  DDimer No results for input(s): DDIMER in the last 168 hours. Lipids:  Lab Results  Component Value Date   CHOL 145 04/30/2019   HDL 41 (L) 04/30/2019   LDLCALC 67 04/30/2019   TRIG 278 (H) 04/30/2019   CHOLHDL 3.5 04/30/2019   INR:  Lab Results  Component Value Date   INR 1.17 04/17/2016   INR 1.04 04/16/2016   INR 1.06 04/15/2016   A1c:  Lab Results  Component Value Date   HGBA1C 8.9 (H) 04/30/2019   Thyroid:  Lab Results  Component Value Date   TSH 1.79 04/30/2019    Radiology/Studies:  DG Chest 2 View  Result Date: 07/24/2019 CLINICAL DATA:  Chest pressure.  Shortness of breath EXAM: CHEST - 2 VIEW COMPARISON:  01/10/2018 FINDINGS: There are few linear airspace opacities at the lung bases, similar to prior study and are favored to represent areas of atelectasis or scarring. The heart size is stable. There is no pneumothorax. No acute osseous abnormality. No large focal infiltrate. There are degenerative changes throughout the visualized thoracic spine. IMPRESSION: No active cardiopulmonary disease. Electronically Signed   By: Constance Holster M.D.   On: 07/24/2019 16:05     Assessment and Plan:   1.  Chest pain: -Although her symptoms have improved with nitroglycerin, they have been continuous for 24 hours without acute ECG changes or enzyme elevations -Admit, continue to cycle enzymes -Do not see a need for heparin or IV nitroglycerin -Discuss ischemic evaluation with MD.  2.  Dyspnea on exertion: -Minimal volume overload on exam with only trace lower extremity edema, and chest x-ray with no active disease -Possible OHS, monitor for hypoxia  3.  Cough, productive at times - CXR ok, no fever or chills - follow for sx  4.  Abnormal CBC: -Her white count is trending down from a year ago. -Absolute lymphocytes are trending down, monocytes are also abnormal -She is mildly anemic. - Her MCV is much higher than it has been in the past. - Her platelet count has also been trending down over the last year. -No bleeding issues -Recheck CBC in a.m. -If multiple readings remain abnormal, may need follow-up.   Principal Problem:   Chest pain with moderate risk for cardiac etiology Active Problems:   Essential hypertension   Type 2 diabetes mellitus with hyperlipidemia (HCC)   CKD (chronic kidney disease), stage III     For questions or updates, please contact [facility] Please consult [email or web address] for contact info under Cardiology/STEMI.    Jonetta Speak, PA-C  07/24/2019 7:29 PM

## 2019-07-24 NOTE — Progress Notes (Signed)
ANTICOAGULATION CONSULT NOTE - Initial Consult  Pharmacy Consult for heparin Indication: chest pain/ACS  Allergies  Allergen Reactions  . Brilinta [Ticagrelor] Shortness Of Breath and Other (See Comments)    Chest pain (also)  . Aspirin Other (See Comments)    Can tolerate in small doses (is already on a blood thinner AND has kidney disease)  . Nsaids Other (See Comments)    Patient is taking a blood thinner and has kidney disease  . Minocycline Hcl Other (See Comments)    Welts  . Oruvail [Ketoprofen] Other (See Comments)    Has kidney disease and is taking a blood thinner  . Tricor [Fenofibrate]     Reaction unknown  . Vasotec [Enalaprilat]     Reaction unknown  . Zinc     Reaction unknown    Patient Measurements: Height: 5\' 1"  (154.9 cm) Weight: 245 lb (111.1 kg) IBW/kg (Calculated) : 47.8 Heparin Dosing Weight: 75.2kg  Vital Signs: Temp: 98.5 F (36.9 C) (03/16 1520) Temp Source: Oral (03/16 1520) BP: 119/61 (03/16 1700) Pulse Rate: 82 (03/16 1700)  Labs: Recent Labs    07/24/19 1521  HGB 11.4*  HCT 36.7  PLT 133*  CREATININE 1.39*  TROPONINIHS 9    Estimated Creatinine Clearance: 38.5 mL/min (A) (by C-G formula based on SCr of 1.39 mg/dL (H)).   Medical History: Past Medical History:  Diagnosis Date  . Anemia    hx (04/14/2016)  . Anxiety   . Arthritis    "severe in my back; hands; ankles" (04/14/2016)  . Atypical chest pain 01/24/2018   Midline pain absent supine "constant" daytime since 12/31/17 > resolved as of 02/20/2018 on gerd/ gas diet   . Basal cell carcinoma    "several burned off; one cut off"  . CAD in native artery    a. NSTEMI 04/2016 - s/p DES toLAD and LCx. PCI to LCx notable for microembolization during cath.  . Chest pain- reslved with stopping Brilinta now on Plaix 04/16/2016  . Chronic lower back pain   . CKD (chronic kidney disease), stage III   . Depression   . DJD (degenerative joint disease)   . Family history of adverse  reaction to anesthesia    "half-sister used to get real sick" (04/14/2016)  . GERD (gastroesophageal reflux disease)   . History of gout   . Hyperlipidemia   . Hypertension   . IBS (irritable bowel syndrome)   . Ischemic cardiomyopathy    a. 04/2016: EF 40-50% by cath, 50-55% +WMA by echo.  . 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 ([place]) ~ 2015  . Morbid obesity ([place])   . NSTEMI (non-ST elevated myocardial infarction) ([place]) 04/14/2016  . OSA on CPAP   . Peripheral neuropathy   . Peripheral vascular disease ([place])   . RLS (restless legs syndrome)   . Spinal stenosis   . Type II diabetes mellitus ([place])   . Vitamin D deficiency     Medications:  Infusions:  . heparin      Assessment: 4 yof presented to the ED with CP and SOB. To start IV heparin. Baseline Hgb and platelets are slightly low. She is not on anticoagulation PTA.   Goal of Therapy:  Heparin level 0.3-0.7 units/ml Monitor platelets by anticoagulation protocol: Yes   Plan:  Heparin bolus 4000 units IV x 1 Heparin gtt 1100 units/hr Check an 8 hr heparin level Daily heparin level and CBC  Janet Choi, Janet Choi 07/24/2019,6:45 PM

## 2019-07-24 NOTE — ED Notes (Signed)
Pt given diet ginger ale.

## 2019-07-25 ENCOUNTER — Other Ambulatory Visit: Payer: Self-pay | Admitting: Internal Medicine

## 2019-07-25 ENCOUNTER — Observation Stay (HOSPITAL_BASED_OUTPATIENT_CLINIC_OR_DEPARTMENT_OTHER): Payer: PPO

## 2019-07-25 DIAGNOSIS — R079 Chest pain, unspecified: Secondary | ICD-10-CM | POA: Diagnosis not present

## 2019-07-25 DIAGNOSIS — I10 Essential (primary) hypertension: Secondary | ICD-10-CM | POA: Diagnosis not present

## 2019-07-25 DIAGNOSIS — I25118 Atherosclerotic heart disease of native coronary artery with other forms of angina pectoris: Secondary | ICD-10-CM | POA: Diagnosis not present

## 2019-07-25 DIAGNOSIS — E1169 Type 2 diabetes mellitus with other specified complication: Secondary | ICD-10-CM | POA: Diagnosis not present

## 2019-07-25 DIAGNOSIS — D531 Other megaloblastic anemias, not elsewhere classified: Secondary | ICD-10-CM

## 2019-07-25 DIAGNOSIS — E785 Hyperlipidemia, unspecified: Secondary | ICD-10-CM | POA: Diagnosis not present

## 2019-07-25 LAB — CBG MONITORING, ED
Glucose-Capillary: 222 mg/dL — ABNORMAL HIGH (ref 70–99)
Glucose-Capillary: 303 mg/dL — ABNORMAL HIGH (ref 70–99)

## 2019-07-25 LAB — COMPREHENSIVE METABOLIC PANEL
ALT: 13 U/L (ref 0–44)
AST: 13 U/L — ABNORMAL LOW (ref 15–41)
Albumin: 3.2 g/dL — ABNORMAL LOW (ref 3.5–5.0)
Alkaline Phosphatase: 87 U/L (ref 38–126)
Anion gap: 15 (ref 5–15)
BUN: 18 mg/dL (ref 8–23)
CO2: 24 mmol/L (ref 22–32)
Calcium: 8.9 mg/dL (ref 8.9–10.3)
Chloride: 100 mmol/L (ref 98–111)
Creatinine, Ser: 1.39 mg/dL — ABNORMAL HIGH (ref 0.44–1.00)
GFR calc Af Amer: 42 mL/min — ABNORMAL LOW (ref >60)
GFR calc non Af Amer: 36 mL/min — ABNORMAL LOW (ref >60)
Glucose, Bld: 189 mg/dL — ABNORMAL HIGH (ref 70–99)
Potassium: 4.1 mmol/L (ref 3.5–5.1)
Sodium: 139 mmol/L (ref 135–145)
Total Bilirubin: 0.7 mg/dL (ref 0.3–1.2)
Total Protein: 6 g/dL — ABNORMAL LOW (ref 6.5–8.1)

## 2019-07-25 LAB — NM MYOCAR MULTI W/SPECT W/WALL MOTION / EF
Peak HR: 87 {beats}/min
Rest HR: 75 {beats}/min

## 2019-07-25 LAB — HEMOGLOBIN A1C
Hgb A1c MFr Bld: 9.5 % — ABNORMAL HIGH (ref 4.8–5.6)
Mean Plasma Glucose: 225.95 mg/dL

## 2019-07-25 LAB — CBC
HCT: 34.2 % — ABNORMAL LOW (ref 36.0–46.0)
HCT: 34.7 % — ABNORMAL LOW (ref 36.0–46.0)
Hemoglobin: 10.7 g/dL — ABNORMAL LOW (ref 12.0–15.0)
Hemoglobin: 10.9 g/dL — ABNORMAL LOW (ref 12.0–15.0)
MCH: 32 pg (ref 26.0–34.0)
MCH: 32.3 pg (ref 26.0–34.0)
MCHC: 31.3 g/dL (ref 30.0–36.0)
MCHC: 31.4 g/dL (ref 30.0–36.0)
MCV: 101.8 fL — ABNORMAL HIGH (ref 80.0–100.0)
MCV: 103.3 fL — ABNORMAL HIGH (ref 80.0–100.0)
Platelets: 127 10*3/uL — ABNORMAL LOW (ref 150–400)
Platelets: 129 10*3/uL — ABNORMAL LOW (ref 150–400)
RBC: 3.31 MIL/uL — ABNORMAL LOW (ref 3.87–5.11)
RBC: 3.41 MIL/uL — ABNORMAL LOW (ref 3.87–5.11)
RDW: 14.4 % (ref 11.5–15.5)
RDW: 14.5 % (ref 11.5–15.5)
WBC: 2.6 10*3/uL — ABNORMAL LOW (ref 4.0–10.5)
WBC: 2.6 10*3/uL — ABNORMAL LOW (ref 4.0–10.5)
nRBC: 0 % (ref 0.0–0.2)
nRBC: 0 % (ref 0.0–0.2)

## 2019-07-25 LAB — CREATININE, SERUM
Creatinine, Ser: 1.34 mg/dL — ABNORMAL HIGH (ref 0.44–1.00)
GFR calc Af Amer: 44 mL/min — ABNORMAL LOW (ref >60)
GFR calc non Af Amer: 38 mL/min — ABNORMAL LOW (ref >60)

## 2019-07-25 LAB — LIPID PANEL
Cholesterol: 127 mg/dL (ref 0–200)
HDL: 34 mg/dL — ABNORMAL LOW (ref >40)
LDL Cholesterol: 42 mg/dL (ref 0–99)
Total CHOL/HDL Ratio: 3.7 RATIO
Triglycerides: 255 mg/dL — ABNORMAL HIGH (ref <150)
VLDL: 51 mg/dL — ABNORMAL HIGH (ref 0–40)

## 2019-07-25 LAB — TROPONIN I (HIGH SENSITIVITY): Troponin I (High Sensitivity): 11 ng/L (ref <18)

## 2019-07-25 LAB — SARS CORONAVIRUS 2 (TAT 6-24 HRS): SARS Coronavirus 2: NEGATIVE

## 2019-07-25 MED ORDER — REGADENOSON 0.4 MG/5ML IV SOLN
0.4000 mg | Freq: Once | INTRAVENOUS | Status: AC
  Filled 2019-07-25: qty 5

## 2019-07-25 MED ORDER — ISOSORBIDE MONONITRATE ER 30 MG PO TB24: 30.0000 mg | ORAL_TABLET | Freq: Every day | ORAL | Status: DC

## 2019-07-25 MED ORDER — TECHNETIUM TC 99M TETROFOSMIN IV KIT
10.2000 | PACK | Freq: Once | INTRAVENOUS | Status: AC | PRN
  Administered 2019-07-25: 10.2 via INTRAVENOUS

## 2019-07-25 MED ORDER — METOPROLOL SUCCINATE ER 25 MG PO TB24
25.0000 mg | ORAL_TABLET | Freq: Every day | ORAL | Status: DC
  Administered 2019-07-25: 25 mg via ORAL
  Filled 2019-07-25: qty 1

## 2019-07-25 MED ORDER — METOPROLOL SUCCINATE ER 25 MG PO TB24: 25.0000 mg | ORAL_TABLET | Freq: Every day | ORAL | 3 refills | Status: AC

## 2019-07-25 MED ORDER — TECHNETIUM TC 99M TETROFOSMIN IV KIT
32.3000 | PACK | Freq: Once | INTRAVENOUS | Status: AC | PRN
  Administered 2019-07-25: 32.3 via INTRAVENOUS

## 2019-07-25 MED ORDER — REGADENOSON 0.4 MG/5ML IV SOLN
INTRAVENOUS | Status: AC
  Administered 2019-07-25: 0.4 mg via INTRAVENOUS
  Filled 2019-07-25: qty 5

## 2019-07-25 NOTE — ED Notes (Signed)
Patient transported to Nuclear Medicine for stress test. ?

## 2019-07-25 NOTE — Progress Notes (Signed)
    Patient presented for Lexiscan nuclear stress test. Tolerated procedure well. Pending final stress imaging result.  Daune Perch, AGNP-C 07/25/2019  10:36 AM Pager: [phone]

## 2019-07-25 NOTE — ED Notes (Signed)
Lunch Tray Ordered @ 1028. 

## 2019-07-25 NOTE — Progress Notes (Addendum)
Progress Note  Patient Name: Janet Choi Date of Encounter: 07/25/2019  Primary Cardiologist: Skeet Latch, MD   Subjective   Pt seen in stress lab. She denies current chest discomfort. She says that her breathing is fine as long as she has oxygen on.   Inpatient Medications    Scheduled Meds: . allopurinol  100 mg Oral Daily  . aspirin EC  81 mg Oral Daily  . atorvastatin  80 mg Oral QPM  . buPROPion  300 mg Oral Daily  . cholecalciferol  5,000 Units Oral BID  . enoxaparin (LOVENOX) injection  40 mg Subcutaneous Q24H  . escitalopram  20 mg Oral Daily  . ferrous sulfate  325 mg Oral Q breakfast  . gabapentin  200 mg Oral QID  . insulin aspart  0-15 Units Subcutaneous TID WC  . insulin aspart  20 Units Subcutaneous TID WC  . insulin glargine  50 Units Subcutaneous QHS  . ketoconazole  1 application Topical BID  . losartan  50 mg Oral Daily  . rOPINIRole  3 mg Oral TID AC & HS  . sodium chloride flush  3 mL Intravenous Once  . sodium chloride flush  3 mL Intravenous Q12H  . vitamin B-12  1,000 mcg Oral Daily   Continuous Infusions: . sodium chloride     PRN Meds: sodium chloride, acetaminophen, ALPRAZolam, nitroGLYCERIN, nitroGLYCERIN, ondansetron (ZOFRAN) IV, polyvinyl alcohol, sodium chloride flush, tiZANidine, zolpidem   Vital Signs    Vitals:   07/25/19 0952 07/25/19 1011 07/25/19 1014 07/25/19 1016  BP: (!) 117/54 133/60 132/68 122/65  Pulse:      Resp:      Temp:      TempSrc:      SpO2:      Weight:      Height:       No intake or output data in the 24 hours ending 07/25/19 1019 Last 3 Weights 07/24/2019 04/30/2019 03/07/2019  Weight (lbs) 245 lb 245 lb 9.6 oz 241 lb 6.4 oz  Weight (kg) 111.131 kg 111.403 kg 109.498 kg      Telemetry    NSR in stress lab  ECG    NSR with no ST/T changes on stress test  Physical Exam   GEN: No acute distress.   Neck: No JVD Cardiac: RRR, no murmurs, rubs, or gallops.  Respiratory: Clear to  auscultation bilaterally. GI: Soft, nontender, non-distended  MS: No edema; No deformity. Neuro:  Nonfocal  Psych: Normal affect   Labs    High Sensitivity Troponin:   Recent Labs  Lab 07/24/19 1521 07/24/19 1721 07/25/19 0019  TROPONINIHS 9 10 11       Chemistry Recent Labs  Lab 07/24/19 1521 07/25/19 0019 07/25/19 0224  NA 137  --  139  K 4.6  --  4.1  CL 99  --  100  CO2 26  --  24  GLUCOSE 281*  --  189*  BUN 20  --  18  CREATININE 1.39* 1.34* 1.39*  CALCIUM 9.0  --  8.9  PROT  --   --  6.0*  ALBUMIN  --   --  3.2*  AST  --   --  13*  ALT  --   --  13  ALKPHOS  --   --  87  BILITOT  --   --  0.7  GFRNONAA 36* 38* 36*  GFRAA 42* 44* 42*  ANIONGAP 12  --  15     Hematology Recent  Labs  Lab 07/24/19 1521 07/25/19 0019 07/25/19 0224  WBC 2.4* 2.6* 2.6*  RBC 3.55* 3.41* 3.31*  HGB 11.4* 10.9* 10.7*  HCT 36.7 34.7* 34.2*  MCV 103.4* 101.8* 103.3*  MCH 32.1 32.0 32.3  MCHC 31.1 31.4 31.3  RDW 14.3 14.5 14.4  PLT 133* 129* 127*    BNP Recent Labs  Lab 07/24/19 1521  BNP 37.1     DDimer No results for input(s): DDIMER in the last 168 hours.   Radiology    DG Chest 2 View  Result Date: 07/24/2019 CLINICAL DATA:  Chest pressure.  Shortness of breath EXAM: CHEST - 2 VIEW COMPARISON:  01/10/2018 FINDINGS: There are few linear airspace opacities at the lung bases, similar to prior study and are favored to represent areas of atelectasis or scarring. The heart size is stable. There is no pneumothorax. No acute osseous abnormality. No large focal infiltrate. There are degenerative changes throughout the visualized thoracic spine. IMPRESSION: No active cardiopulmonary disease. Electronically Signed   By: Constance Holster M.D.   On: 07/24/2019 16:05    Cardiac Studies   Lexiscan myoview in process  ECHO: 04/15/2016 - Left ventricle: The cavity size was normal. There was moderate  focal basal and mild concentric hypertrophy. Systolic function  was  normal. The estimated ejection fraction was in the range of  50% to 55%. There is akinesis of the distal septal and apical  myocardium. There is akinesis of the apicalinferior myocardium.  There was an increased relative contribution of atrial  contraction to ventricular filling. Doppler parameters are  consistent with abnormal left ventricular relaxation (grade 1  diastolic dysfunction). Doppler parameters are consistent with  high ventricular filling pressure.  - Aortic valve: Mildly to moderately calcified annulus. Mildly  calcified leaflets.  - Mitral valve: Calcified annulus. Mildly calcified leaflets .  There was trivial regurgitation.  - Atrial septum: No defect or patent foramen ovale was identified.   CATH: 04/15/2016  Non-ST elevation myocardial infarction with identification of [id number] segmental mid LAD stenosis and focal eccentric 95-99% mid circumflex stenosis.  The first diagonal is small and contains segmental 80% stenosis. The coronaries are otherwise widely patent.  Successful drug-eluting stent using Onyx Resolute to reduce a 95% mid LAD stenosis to 0% with TIMI grade 3 flow. Final stent diameter 3.0 mm.  Successful drug-eluting stent using Onyx Resolute to reduce a 95% mid circumflex stenosis to 0% with TIMI grade 3 flow. Post stent diameter 3.5 mm. Intervention on this vessel was, complicated by microembolization to the the most distal obtuse marginal beyond a bifurcation.  Mild hypokinesis of the anterior wall. EF 40-50%.  RECOMMENDATIONS:   Brilinta and aspirin times at least 12 months.  We discussed high-intensity statin therapy. She is willing to restart therapy.  Aggrastat infusion for 18 hours due to microembolization.  IV nitroglycerin for 12 hours.  Potential discharge tomorrow assuming stable clinical condition without significant rebound and cardiac markers.  Patient Profile     80 y.o. female with a history of NSTEMI 2017 with  PCI of the LAD and LCx, hypertensive heart disease, OSA, DM, HTN, HLD, morbid obesity, IBS, GERD, and CKDIII being seen for evaluation of chest pain.  Assessment & Plan    Chest pain -Pt with 3 days of intermittent chest tightness. None currently.  -Troponins negative X3. BNP 37.1. -EKG unchanged from prior. -Pt currently undergoing lexiscan Myoview.   Dyspnea on exertion -Pt is wearing oxygen, not on oxygen at home. She says that her  breathing is fine as long as she has the oxygen on.  -Possible OHS. Per previous note: She has hx of sleep apnea but unable to tolerated a mask. She was also unable to tolerated oxygen at night.  -Undergoing stress myoview.  -Pt has had cough. Tested negative for Covid 19. Further evaluation per IM.   CAD -Hx of 2 prior stents. Continues on aspirin and high intensity statin as well as ARB. -Myoview in process.  CKD stage 3 -SCr stable at 1.39  For questions or updates, please contact [place] Please consult [email or web address] for contact info under        Signed, Daune Perch, NP  07/25/2019, 10:19 AM    I have seen and examined the patient along with Daune Perch, NP .  I have reviewed the chart, notes and new data.  I agree with PA/NP's note.  Key new complaints: Chest squeezing sensation very likely represents angina pectoris.  It is usually brought on by emotions/anxiety (she has a lot of financial difficulty).  Currently asymptomatic. Key examination changes: Obesity, but otherwise normal cardiovascular exam.  No arrhythmia, no clinical evidence of hypervolemia. Key new findings / data: Nuclear study images reviewed.  There is indeed a tiny area of reversible perfusion abnormality in the anteroapical area.  It would fit well with the distribution of the diagonal artery.  She is known to have high-grade stenosis in a small diagonal on her last cardiac catheterization.  Cardiac enzymes are normal.  The ECG shows sinus rhythm with left  anterior fascicular block and there are no ischemic repolarization abnormalities.  PLAN: It sounds to me that she is having the equivalent of stable angina pectoris, triggered by emotional distress.  The nuclear stress test is a low risk study.  Cardiac catheterization is not indicated.  It is reassuring to note that the left ventricular ejection fraction (56% by current scintigraphy) has improved compared to the prerevascularization echo and the left ventricular angiogram which showed EF around 50% or less. Recommend isosorbide mononitrate 30 mg daily as an antianginal. Continue statin.  Her current LDL cholesterol is good at 67.  She remains hypertriglyceridemic, but additional medications are not justified.  The focus should be on improved glycemic control (hemoglobin A1c 9.5%). We will arrange follow-up to see if there is need to adjust her antianginal regimen. Discharge home today.  Sanda Klein, MD, [place] [phone] 07/25/2019, 1:55 PM

## 2019-07-25 NOTE — Discharge Summary (Signed)
Discharge Summary    Patient ID: MAGIC CAPENER MRN: [id number]; DOB: [birth year]  Admit date: 07/24/2019 Discharge date: 07/25/2019  Primary Care Provider: [name], MD  Primary Cardiologist: Skeet Latch, MD  Primary Electrophysiologist:  None   Discharge Diagnoses    Principal Problem:   Chest pain with moderate risk for cardiac etiology Active Problems:   Dyslipidemia   Essential hypertension   Morbid obesity due to excess calories ([place])   Type 2 diabetes mellitus with hyperlipidemia ([place])   OSA on CPAP   CKD (chronic kidney disease), stage III   Diagnostic Studies/Procedures    Nuclear stress test 07/25/19: IMPRESSION: 1. Small focus of mild ischemia in the anterior septal wall. No additional evidence of reversible ischemia. No evidence of infarction.  2. Normal left ventricular wall motion.  3. Left ventricular ejection fraction 56%  4. Non invasive risk stratification*: Low to intermediate _____________   History of Present Illness     Janet Choi is a 79 y.o. female with  a history of NSTEMI 2017 with PCI of the LAD and LCx, hypertensive heart disease, OSA, DM, HTN, HLD, morbid obesity, IBS, GERD, and CKDIII who presented with several days of DOE, fatigue, and chest pressure.  Janet Choi admits that she has not been taking very good care of herself since Covid started.  She had been walking, and doing okay with this.  She quit that.  Her exertional level has been poor, she admits that she sits most of the day.  Her diet has been poor, she admits to eating mostly snacks.  She has gained some weight, she is up about 10 pounds in the last year.  She has daytime lower extremity edema, but says she does not wake with it.  She snores and has been told she has sleep apnea, but says she is not been able to wear the mask.  She also has not been able to wear oxygen at night which was also the plan at one time.  According to Janet Choi, her oxygen saturation  will drop down to 88% when she is relaxed.  Approximately 3 days ago, she developed chest pain.  It was a pressure and she took a nitro.  That day, the chest pain improved and resolved.  She has been coughing, coughing up occasional clear phlegm.  She has not had fevers or chills.  She has not lost her sense of taste or smell.  Her appetite has been poor.  When she walks or exerts herself, she will hear herself wheeze.  Yesterday, she again developed chest pain and just did not feel well in general.  The chest pain remained continuous at at least a 2/10, sometimes more intense.    Today, the chest pain became more severe.  It reached a 9/10.  She can feel it a little bit more with deep inspiration, but states it does not change with coughing.  When the pain became more severe, she tried nitroglycerin for.  She feels the nitroglycerin helped.  She became concerned because of the severity and intensity of the pain.  She called EMS.  She received aspirin 81 mg x 4 and 2 sublingual nitroglycerin.  The pain dropped down to a 2/10.  She received another sublingual nitroglycerin in the emergency room.  Currently her chest pain is a 2/10.  It has been at least a 2/10 for over 24 hours.  Hospital Course     Consultants:   Chest pain, DOE CAD s/p  PCI to LAD 2017 Hypertension She was admitted to cardiology to trend CE and monitor symptoms. CP improved with nitro. CP had been continuous for 24 hrs without changes in her EKG or positive CE. HS troponin x 2 negative.  EKG was nonischemic.  She underwent lexiscan myoview 07/25/19 that was negative for reversible ischemia. Will add 25 mg lopressor succinate to her regimen. Continue ASA, statin, and ARB.    Pancytopenia WBC 2.6.  Absolute lymphocytes trending down, monocytes abnormal. Mildly anemia. MCV is higher than previous labs. PLT also trending down.  No signs of active bleeding.  Recommend PCP follow up and possibly referral to hematology. Continue  iron supplements.   Dyspnea on exertion Minimal volume overload on exam, CXR negative for acute findings. Suspect a component of OHS. She is under a lot of financial stress.   Hyperlipidemia 07/25/2019: Cholesterol 127; HDL 34; LDL Cholesterol 42; Triglycerides 255; VLDL 51 Continue statin.   Pt was seen and examined by Dr. Sallyanne Kuster and deemed stable for discharge. I will arrange follow up with cardiology.    Did the patient have an acute coronary syndrome (MI, NSTEMI, STEMI, etc) this admission?:  No                               Did the patient have a percutaneous coronary intervention (stent / angioplasty)?:  No.   _____________  Discharge Vitals Blood pressure 118/65, pulse 79, temperature 98.5 F (36.9 C), temperature source Oral, resp. rate (!) 26, height 5\' 1"  (1.549 m), weight 111.1 kg, SpO2 94 %.  Filed Weights   07/24/19 1518  Weight: 111.1 kg    Labs & Radiologic Studies    CBC Recent Labs    07/25/19 0019 07/25/19 0224  WBC 2.6* 2.6*  HGB 10.9* 10.7*  HCT 34.7* 34.2*  MCV 101.8* 103.3*  PLT 129* [id number]*   Basic Metabolic Panel Recent Labs    07/24/19 1521 07/24/19 1521 07/25/19 0019 07/25/19 0224  NA 137  --   --  139  K 4.6  --   --  4.1  CL 99  --   --  100  CO2 26  --   --  24  GLUCOSE 281*  --   --  189*  BUN 20  --   --  18  CREATININE 1.39*   < > 1.34* 1.39*  CALCIUM 9.0  --   --  8.9   < > = values in this interval not displayed.   Liver Function Tests Recent Labs    07/25/19 0224  AST 13*  ALT 13  ALKPHOS 87  BILITOT 0.7  PROT 6.0*  ALBUMIN 3.2*   No results for input(s): LIPASE, AMYLASE in the last 72 hours. High Sensitivity Troponin:   Recent Labs  Lab 07/24/19 1521 07/24/19 1721 07/25/19 0019  TROPONINIHS 9 10 11     BNP Invalid input(s): POCBNP D-Dimer No results for input(s): DDIMER in the last 72 hours. Hemoglobin A1C Recent Labs    07/25/19 0019  HGBA1C 9.5*   Fasting Lipid Panel Recent Labs     07/25/19 0224  CHOL 127  HDL 34*  LDLCALC 42  TRIG 255*  CHOLHDL 3.7   Thyroid Function Tests No results for input(s): TSH, T4TOTAL, T3FREE, THYROIDAB in the last 72 hours.  Invalid input(s): FREET3 _____________  DG Chest 2 View  Result Date: 07/24/2019 CLINICAL DATA:  Chest pressure.  Shortness of breath EXAM:  CHEST - 2 VIEW COMPARISON:  01/10/2018 FINDINGS: There are few linear airspace opacities at the lung bases, similar to prior study and are favored to represent areas of atelectasis or scarring. The heart size is stable. There is no pneumothorax. No acute osseous abnormality. No large focal infiltrate. There are degenerative changes throughout the visualized thoracic spine. IMPRESSION: No active cardiopulmonary disease. Electronically Signed   By: Constance Holster M.D.   On: 07/24/2019 16:05   NM Myocar Multi W/Spect W/Wall Motion / EF  Result Date: 07/25/2019 CLINICAL DATA:  79 year old female with chest pain. EXAM: MYOCARDIAL IMAGING WITH SPECT (REST AND PHARMACOLOGIC-STRESS) GATED LEFT VENTRICULAR WALL MOTION STUDY LEFT VENTRICULAR EJECTION FRACTION TECHNIQUE: Standard myocardial SPECT imaging was performed after resting intravenous injection of 10 mCi Tc-43m tetrofosmin. Subsequently, intravenous infusion of Lexiscan was performed under the supervision of the Cardiology staff. At peak effect of the drug, 30 mCi Tc-26m tetrofosmin was injected intravenously and standard myocardial SPECT imaging was performed. Quantitative gated imaging was also performed to evaluate left ventricular wall motion, and estimate left ventricular ejection fraction. COMPARISON:  None. FINDINGS: Perfusion: A small focus of mild decreased perfusion in the apical and mid segment the anterior septal wall which improves from stress to rest. No additional decreased perfusion identified. Wall Motion: Normal left ventricular wall motion. No left ventricular dilation. Left Ventricular Ejection Fraction: 56 % End  diastolic volume [id number] ml End systolic volume 40 ml IMPRESSION: 1. Small focus of mild ischemia in the anterior septal wall. No additional evidence of reversible ischemia. No evidence of infarction. 2. Normal left ventricular wall motion. 3. Left ventricular ejection fraction 56% 4. Non invasive risk stratification*: Low to intermediate *2012 Appropriate Use Criteria for Coronary Revascularization Focused Update: J Am Coll Cardiol. N6492421. http://content.[email or web address]=[email or web address] Electronically Signed   By: Suzy Bouchard M.D.   On: 07/25/2019 12:06   Disposition   Pt is being discharged home today in good condition.  Follow-up Plans & Appointments    Follow-up Information    Deberah Pelton, NP Follow up on 08/20/2019.   Specialty: Cardiology Why: 8:45 am for hospital follow up Contact information: [address] [place] [address] [phone]          Discharge Instructions    Diet - low sodium heart healthy   Complete by: As directed    Increase activity slowly   Complete by: As directed       Discharge Medications   Allergies as of 07/25/2019      Reactions   Brilinta [ticagrelor] Shortness Of Breath, Other (See Comments)   Chest pain (also)   Aspirin Other (See Comments)   Can tolerate in small doses (is already on a blood thinner AND has kidney disease)   Nsaids Other (See Comments)   Patient is taking a blood thinner and has kidney disease   Minocycline Hcl Other (See Comments)   Welts   Oruvail [ketoprofen] Other (See Comments)   Has kidney disease and is taking a blood thinner   Tricor [fenofibrate]    Reaction unknown   Vasotec [enalaprilat]    Reaction unknown   Zinc    Reaction unknown      Medication List    TAKE these medications   acetaminophen 325 MG tablet Commonly known as: TYLENOL Take 2 tablets (650 mg total) by mouth every 4 (four) hours as needed for headache or mild pain.   allopurinol 100  MG tablet Commonly known as: ZYLOPRIM TAKE 1 TABLET DAILY TO  PREVENT GOUT What changed: See the new instructions.   aspirin 81 MG EC tablet Take 1 tablet (81 mg total) by mouth daily.   atorvastatin 80 MG tablet Commonly known as: LIPITOR TAKE 1 TABLET (80 MG TOTAL) BY MOUTH DAILY AT 6 PM. What changed: when to take this   Basaglar KwikPen 100 UNIT/ML Inject 70 units Daily for Diabetes What changed:   how much to take  how to take this  when to take this   BD Pen Needle Nano U/F 32G X 4 MM Misc Generic drug: Insulin Pen Needle USE AS DIRECTED 4 TIMES A DAY   BLINK TEARS OP Place 1 drop into both eyes daily as needed.   buPROPion 300 MG 24 hr tablet Commonly known as: WELLBUTRIN XL Take 1 tablet (300 mg total) by mouth daily. Take 1 tablet Daily for Mood   colchicine 0.6 MG tablet Take 2 tablets first day of gout flare, then one a day until gout flare is better   [employer] Vit D 5000 High-Potency 125 MCG (5000 UT) capsule Generic drug: Cholecalciferol Take 5,000 Units by mouth 2 (two) times daily.   diclofenac sodium 1 % Gel Commonly known as: VOLTAREN Apply 4 g topically 4 (four) times daily. What changed:   when to take this  reasons to take this   escitalopram 20 MG tablet Commonly known as: Lexapro Take 1 tablet (20 mg total) by mouth daily. What changed: how much to take   famotidine 20 MG tablet Commonly known as: Pepcid One at bedtime   ferrous sulfate 325 (65 FE) MG tablet Take 325 mg by mouth daily with breakfast.   furosemide 40 MG tablet Commonly known as: LASIX Take 40 mg by mouth as needed for fluid.   gabapentin 100 MG capsule Commonly known as: NEURONTIN Take 1 to 2 capsules 3 to 4 x /day as needed for Painful Diabetic Neuropathy What changed:   how much to take  how to take this  when to take this  additional instructions   insulin aspart 100 UNIT/ML FlexPen Commonly known as: NovoLOG FlexPen Inject 50  to 75 units 3 x /day  before meals as directed for Diabetes What changed:   how much to take  how to take this  when to take this   ketoconazole 2 % cream Commonly known as: NIZORAL Apply 1 application topically 2 (two) times daily.   losartan 50 MG tablet Commonly known as: COZAAR Take 1 tablet Daily for BP & Diabetic Kidney Protection What changed:   how much to take  how to take this  when to take this  additional instructions   metoprolol succinate 25 MG 24 hr tablet Commonly known as: TOPROL-XL Take 1 tablet (25 mg total) by mouth at bedtime.   nitroGLYCERIN 0.4 MG SL tablet Commonly known as: NITROSTAT Place 1 tablet (0.4 mg total) under the tongue every 5 (five) minutes x 3 doses as needed for chest pain.   onetouch ultrasoft lancets Use as instructed   OneTouch Verio test strip Generic drug: glucose blood USE AS INSTRUCTED TO CHECK SUGAR 2 TIMES DAILY.   rOPINIRole 3 MG tablet Commonly known as: REQUIP Take 1 tablet 4 x /day for Restless Legs What changed:   how much to take  how to take this  when to take this  additional instructions   tiZANidine 4 MG tablet Commonly known as: ZANAFLEX TAKE 1/2 TO 1 TABLET 2 TO 3 X /DAY . . . ONLY . Marland Kitchen Marland Kitchen  IF NEEDED FOR MUSCLE SPASMS What changed: See the new instructions.   Victoza 18 MG/3ML Sopn Generic drug: liraglutide INJECT 0.3 MLS (1.8 MG TOTAL) INTO THE SKIN ONCE A WEEK FOR 12 DOSES. What changed: See the new instructions.   vitamin B-12 1000 MCG tablet Commonly known as: CYANOCOBALAMIN Take 1,000 mcg by mouth daily.          Outstanding Labs/Studies   none  Duration of Discharge Encounter   Greater than 30 minutes including physician time.  Signed, Tami Lin Kamryn Gauthier, PA 07/25/2019, 2:18 PM

## 2019-07-25 NOTE — ED Notes (Signed)
Tele   Breakfast ordered  

## 2019-07-26 ENCOUNTER — Telehealth: Payer: Self-pay | Admitting: *Deleted

## 2019-07-26 NOTE — Telephone Encounter (Signed)
Called patient on 07/26/2019 , 9:32 AM in an attempt to reach the patient for a hospital follow up.   Admit date: 07/24/19 Discharge: 07/25/19   She does not have any questions or concerns about medications from the hospital admission. The patient's medications were reviewed over the phone, they were counseled to bring in all current medications to the hospital follow up visit.   I advised the patient to call if any questions or concerns arise about the hospital admission or medications. Patient had no questions regarding her medications.   Home health was not started in the hospital.  All questions were answered and a follow up appointment was made. Appointment 07/30/2019 with Irving Shows for hospital follow up and labs.  Prior to Admission medications   Medication Sig Start Date End Date Taking? Authorizing Provider  acetaminophen (TYLENOL) 325 MG tablet Take 2 tablets (650 mg total) by mouth every 4 (four) hours as needed for headache or mild pain. 04/20/16   Isaiah Serge, NP  allopurinol (ZYLOPRIM) 100 MG tablet TAKE 1 TABLET DAILY TO PREVENT GOUT Patient taking differently: Take 100 mg by mouth daily.  03/11/19   [name], MD  aspirin EC 81 MG EC tablet Take 1 tablet (81 mg total) by mouth daily. 04/21/16   Isaiah Serge, NP  atorvastatin (LIPITOR) 80 MG tablet TAKE 1 TABLET (80 MG TOTAL) BY MOUTH DAILY AT 6 PM. Patient taking differently: Take 80 mg by mouth every evening.  04/30/19   Skeet Latch, MD  buPROPion (WELLBUTRIN XL) 300 MG 24 hr tablet Take 1 tablet (300 mg total) by mouth daily. Take 1 tablet Daily for Mood 03/10/19   [name], MD  Cholecalciferol ([employer] VIT D 5000 HIGH-POTENCY) 5000 units capsule Take 5,000 Units by mouth 2 (two) times daily.     [provider]  colchicine 0.6 MG tablet Take 2 tablets first day of gout flare, then one a day until gout flare is better Patient not taking: Reported on 07/24/2019 01/20/17 07/03/18  [name], MD  diclofenac sodium (VOLTAREN) 1 % GEL Apply 4 g topically 4 (four) times daily. Patient taking differently: Apply 4 g topically daily as needed (For had pain).  01/18/18   Vicie Mutters, PA-C  escitalopram (LEXAPRO) 20 MG tablet Take 1 tablet (20 mg total) by mouth daily. Patient taking differently: Take 30 mg by mouth daily.  07/02/19 07/01/20  Vicie Mutters, PA-C  famotidine (PEPCID) 20 MG tablet One at bedtime Patient not taking: Reported on 07/24/2019 01/23/18   Tanda Rockers, MD  ferrous sulfate 325 (65 FE) MG tablet Take 325 mg by mouth daily with breakfast.    [provider]  furosemide (LASIX) 40 MG tablet Take 40 mg by mouth as needed for fluid.    [provider]  gabapentin (NEURONTIN) 100 MG capsule Take 1 to 2 capsules 3 to 4 x /day as needed for Painful Diabetic Neuropathy Patient taking differently: Take 200 mg by mouth in the morning, at noon, in the evening, and at bedtime.  01/01/19   [name], MD  insulin aspart (NOVOLOG FLEXPEN) 100 UNIT/ML FlexPen Inject 50  to 75 units 3 x /day before meals as directed for Diabetes Patient taking differently: Inject 20 Units into the skin 3 (three) times daily with meals. Inject 50  to 75 units 3 x /day before meals as directed for Diabetes 08/17/18   [name], MD  Insulin Glargine [facility]) 100 UNIT/ML SOPN Inject 70 units  Daily for Diabetes Patient taking differently: Inject 70 Units into the skin at bedtime. Inject 70 units Daily for Diabetes 04/30/19   [name], MD  Insulin Pen Needle (BD PEN NEEDLE NANO U/F) 32G X 4 MM MISC USE AS DIRECTED 4 TIMES A DAY 02/22/19   [name], MD  ketoconazole (NIZORAL) 2 % cream Apply 1 application topically 2 (two) times daily. 07/02/19   Vicie Mutters, PA-C  Lancets [facility] ULTRASOFT) lancets Use as instructed 04/27/16   Philemon Kingdom, MD  losartan (COZAAR) 50 MG tablet Take 1 tablet Daily for BP & Diabetic Kidney  Protection Patient taking differently: Take 50 mg by mouth daily.  03/10/19   [name], MD  metoprolol succinate (TOPROL-XL) 25 MG 24 hr tablet Take 1 tablet (25 mg total) by mouth at bedtime. 07/25/19   Duke, Tami Lin, PA  nitroGLYCERIN (NITROSTAT) 0.4 MG SL tablet Place 1 tablet (0.4 mg total) under the tongue every 5 (five) minutes x 3 doses as needed for chest pain. 10/04/17   [name], MD  ONETOUCH VERIO test strip USE AS INSTRUCTED TO CHECK SUGAR 2 TIMES DAILY. 09/21/17   Philemon Kingdom, MD  Polyethylene Glycol 400 (BLINK TEARS OP) Place 1 drop into both eyes daily as needed.    [provider]  rOPINIRole (REQUIP) 3 MG tablet Take 1 tablet 4 x /day for Restless Legs Patient taking differently: Take 3 mg by mouth in the morning, at noon, in the evening, and at bedtime.  03/25/19   [name], MD  tiZANidine (ZANAFLEX) 4 MG tablet TAKE 1/2 TO 1 TABLET 2 TO 3 X /DAY . . . ONLY . . . IF NEEDED FOR MUSCLE SPASMS Patient taking differently: Take 2 mg by mouth at bedtime as needed for muscle spasms.  07/02/19   Liane Comber, NP  VICTOZA 18 MG/3ML SOPN INJECT 0.3 MLS (1.8 MG TOTAL) INTO THE SKIN ONCE A WEEK FOR 12 DOSES. Patient taking differently: Inject 1.8 mg into the skin once a week.  04/04/19   Vicie Mutters, PA-C  vitamin B-12 (CYANOCOBALAMIN) 1000 MCG tablet Take 1,000 mcg by mouth daily.    [provider]

## 2019-07-27 NOTE — Progress Notes (Signed)
Hospital follow up  Assessment and Plan: Hospital visit follow up for  Megaloblastic anemia/pancytopenia Check labs ? From medication versus would benefit from hematology referral -     Folate RBC -     Reticulocytes -     Methylmalonic acid, serum -     Iron,Total/Total Iron Binding Cap -     Vitamin B12  DOE (dyspnea on exertion) -     CBC with Differential/Platelet -     COMPLETE METABOLIC PANEL WITH GFR -     Iron,Total/Total Iron Binding Cap -     For home use only DME continuous positive airway pressure (CPAP) - need to make sure she is on the CPAP - weight loss advised - get CT chest- continuing cough and abnormal CXR  Chest pain with moderate risk for cardiac etiology -     CBC with Differential/Platelet -     COMPLETE METABOLIC PANEL WITH GFR - negative stress test and VQ study  Hyperlipidemia associated with type 2 diabetes mellitus ([place]) Will try to get freestyle libre -     liraglutide (VICTOZA) 18 MG/3ML SOPN; Inject 0.2 mLs (1.2 mg total) into the skin every morning.  Uncontrolled type 2 diabetes mellitus with stage 3 chronic kidney disease, with long-term current use of insulin ([place]) Will try to get freestyle libre -     liraglutide (VICTOZA) 18 MG/3ML SOPN; Inject 0.2 mLs (1.2 mg total) into the skin every morning.  Morbid obesity due to excess calories ([place]) -     For home use only DME continuous positive airway pressure (CPAP) - get on CPAP Weight loss advised and needs to try to work out again  OSA (obstructive sleep apnea) -     For home use only DME continuous positive airway pressure (CPAP)  Type 2 diabetes mellitus with stage 3 chronic kidney disease, with long-term current use of insulin ([place]) Will try to get freestyle libre -     liraglutide (VICTOZA) 18 MG/3ML SOPN; Inject 0.2 mLs (1.2 mg total) into the skin every morning.    All medications were reviewed with patient and family and fully reconciled. All questions answered fully, and  patient and family members were encouraged to call the office with any further questions or concerns. Discussed goal to avoid readmission related to this diagnosis.  Medications Discontinued During This Encounter  Medication Reason  . VICTOZA 18 MG/3ML SOPN     CAN NOT DO FOR BCBS REGULAR OR MEDICARE Over 40 minutes of exam, counseling, chart review, and complex, high/moderate level critical decision making was performed this visit.   Future Appointments  Date Time Provider [place]  08/08/2019 10:30 AM Vicie Mutters, PA-C [facility] None  08/20/2019  8:45 AM Deberah Pelton, NP [facility]  11/05/2019  2:30 PM [name], MD [facility] None  05/14/2020 11:00 AM [name], MD [facility] None     HPI 79 y.o.female with a history of NSTEMI2017with PCI of the LAD and LCx, hypertensive heart disease, OSA, DM, HTN, HLD, morbid obesity, IBS, GERD,and CKDIII presents for follow up for transition from recent hospitalization or SNIF stay. Admit date to the hospital was 07/24/19, patient was discharged from the hospital on 07/25/19 and our clinical staff contacted the office the day after discharge to set up a follow up appointment. The discharge summary, medications, and diagnostic test results were reviewed before meeting with the patient. The patient was admitted for:   DOE and chest pressure. While admitted to cardiology service, she had  an  EKG was nonischemic and negative serial troponins..  She underwent lexiscan myoview 07/25/19 that was negative for reversible ischemia. Lopressor XL 25 mg was added, and we will continue with aggressive medical management. She is on ASA, lipitor 80, and losartan 50 mg.  She states she does not have any chest pressure but she continue to have a cough and wheezing. She had the normal CXR 07/24/2019 other than few linear airspce opacites at lung base, seen on prior study and unchanged. She is NOT using her CPAP. States it is really  long cord, just a nasal thing that she says looks like a nasal cannula.   She admits since the pandemic, she has gained weight, not working out and has not been eating well.  Lab Results  Component Value Date   CHOL 127 07/25/2019   HDL 34 (L) 07/25/2019   LDLCALC 42 07/25/2019   TRIG 255 (H) 07/25/2019   CHOLHDL 3.7 07/25/2019   She is on victoza but only once a week, not every day, she is on the basaglar 70 units at night and novolog 20 units 3 x a day with food. She is interested in a continuous glucose monitor which I think would benefit her and [place] adjusting her medications.   Lab Results  Component Value Date   HGBA1C 9.5 (H) 07/25/2019   While in the hospital, she also had pancytopenia.  Lab Results  Component Value Date   WBC 2.6 (L) 07/25/2019   HGB 10.7 (L) 07/25/2019   HCT 34.2 (L) 07/25/2019   MCV 103.3 (H) 07/25/2019   PLT 127 (L) 07/25/2019   Lab Results  Component Value Date   IRON 59 06/29/2018   TIBC 443 06/29/2018   FERRITIN 21 06/29/2018   Lab Results  Component Value Date   VITAMINB12 1,402 (H) 02/22/2019    Images while in the hospital: DG Chest 2 View  Result Date: 07/24/2019 CLINICAL DATA:  Chest pressure.  Shortness of breath EXAM: CHEST - 2 VIEW COMPARISON:  01/10/2018 FINDINGS: There are few linear airspace opacities at the lung bases, similar to prior study and are favored to represent areas of atelectasis or scarring. The heart size is stable. There is no pneumothorax. No acute osseous abnormality. No large focal infiltrate. There are degenerative changes throughout the visualized thoracic spine. IMPRESSION: No active cardiopulmonary disease. Electronically Signed   By: Constance Holster M.D.   On: 07/24/2019 16:05   NM Myocar Multi W/Spect W/Wall Motion / EF  Result Date: 07/25/2019 CLINICAL DATA:  79 year old female with chest pain. EXAM: MYOCARDIAL IMAGING WITH SPECT (REST AND PHARMACOLOGIC-STRESS) GATED LEFT VENTRICULAR WALL MOTION STUDY LEFT  VENTRICULAR EJECTION FRACTION TECHNIQUE: Standard myocardial SPECT imaging was performed after resting intravenous injection of 10 mCi Tc-69m tetrofosmin. Subsequently, intravenous infusion of Lexiscan was performed under the supervision of the Cardiology staff. At peak effect of the drug, 30 mCi Tc-74m tetrofosmin was injected intravenously and standard myocardial SPECT imaging was performed. Quantitative gated imaging was also performed to evaluate left ventricular wall motion, and estimate left ventricular ejection fraction. COMPARISON:  None. FINDINGS: Perfusion: A small focus of mild decreased perfusion in the apical and mid segment the anterior septal wall which improves from stress to rest. No additional decreased perfusion identified. Wall Motion: Normal left ventricular wall motion. No left ventricular dilation. Left Ventricular Ejection Fraction: 56 % End diastolic volume [id number] ml End systolic volume 40 ml IMPRESSION: 1. Small focus of mild ischemia in the anterior septal wall. No additional  evidence of reversible ischemia. No evidence of infarction. 2. Normal left ventricular wall motion. 3. Left ventricular ejection fraction 56% 4. Non invasive risk stratification*: Low to intermediate *2012 Appropriate Use Criteria for Coronary Revascularization Focused Update: J Am Coll Cardiol. [id number]. http://content.[email or web address].aspx?articleid=[email or web address] Electronically Signed   By: Suzy Bouchard M.D.   On: 07/25/2019 12:06     Current Outpatient Medications (Endocrine & Metabolic):  .  insulin aspart (NOVOLOG FLEXPEN) 100 UNIT/ML FlexPen, Inject 50  to 75 units 3 x /day before meals as directed for Diabetes (Patient taking differently: Inject 20 Units into the skin 3 (three) times daily with meals. Inject 50  to 75 units 3 x /day before meals as directed for Diabetes) .  Insulin Glargine (BASAGLAR KWIKPEN) 100 UNIT/ML SOPN, Inject 70 units Daily for Diabetes (Patient taking differently:  Inject 70 Units into the skin at bedtime. Inject 70 units Daily for Diabetes) .  liraglutide (VICTOZA) 18 MG/3ML SOPN, Inject 0.2 mLs (1.2 mg total) into the skin every morning.  Current Outpatient Medications (Cardiovascular):  .  atorvastatin (LIPITOR) 80 MG tablet, TAKE 1 TABLET (80 MG TOTAL) BY MOUTH DAILY AT 6 PM. (Patient taking differently: Take 80 mg by mouth every evening. ) .  furosemide (LASIX) 40 MG tablet, Take 40 mg by mouth as needed for fluid. Marland Kitchen  losartan (COZAAR) 50 MG tablet, Take 1 tablet Daily for BP & Diabetic Kidney Protection (Patient taking differently: Take 50 mg by mouth daily. ) .  metoprolol succinate (TOPROL-XL) 25 MG 24 hr tablet, Take 1 tablet (25 mg total) by mouth at bedtime. .  nitroGLYCERIN (NITROSTAT) 0.4 MG SL tablet, Place 1 tablet (0.4 mg total) under the tongue every 5 (five) minutes x 3 doses as needed for chest pain.   Current Outpatient Medications (Analgesics):  .  acetaminophen (TYLENOL) 325 MG tablet, Take 2 tablets (650 mg total) by mouth every 4 (four) hours as needed for headache or mild pain. Marland Kitchen  allopurinol (ZYLOPRIM) 100 MG tablet, TAKE 1 TABLET DAILY TO PREVENT GOUT (Patient taking differently: Take 100 mg by mouth daily. ) .  aspirin EC 81 MG EC tablet, Take 1 tablet (81 mg total) by mouth daily. .  colchicine 0.6 MG tablet, Take 2 tablets first day of gout flare, then one a day until gout flare is better (Patient not taking: Reported on 07/24/2019)  Current Outpatient Medications (Hematological):  .  ferrous sulfate 325 (65 FE) MG tablet, Take 325 mg by mouth daily with breakfast. .  vitamin B-12 (CYANOCOBALAMIN) 1000 MCG tablet, Take 1,000 mcg by mouth daily.  Current Outpatient Medications (Other):  Marland Kitchen  buPROPion (WELLBUTRIN XL) 300 MG 24 hr tablet, Take 1 tablet (300 mg total) by mouth daily. Take 1 tablet Daily for Mood .  Cholecalciferol ([employer]S VIT D 5000 HIGH-POTENCY) 5000 units capsule, Take 5,000 Units by mouth 2 (two) times daily.  .   diclofenac sodium (VOLTAREN) 1 % GEL, Apply 4 g topically 4 (four) times daily. (Patient taking differently: Apply 4 g topically daily as needed (For had pain). ) .  escitalopram (LEXAPRO) 20 MG tablet, Take 1 tablet (20 mg total) by mouth daily. (Patient taking differently: Take 30 mg by mouth daily. ) .  famotidine (PEPCID) 20 MG tablet, One at bedtime .  gabapentin (NEURONTIN) 100 MG capsule, Take 1 to 2 capsules 3 to 4 x /day as needed for Painful Diabetic Neuropathy (Patient taking differently: Take 200 mg by mouth in the morning, at noon, in the evening, and  at bedtime. ) .  Insulin Pen Needle (BD PEN NEEDLE NANO U/F) 32G X 4 MM MISC, USE AS DIRECTED 4 TIMES A DAY .  ketoconazole (NIZORAL) 2 % cream, Apply 1 application topically 2 (two) times daily. .  Lancets (ONETOUCH ULTRASOFT) lancets, Use as instructed .  ONETOUCH VERIO test strip, USE AS INSTRUCTED TO CHECK SUGAR 2 TIMES DAILY. .  Polyethylene Glycol 400 (BLINK TEARS OP), Place 1 drop into both eyes daily as needed. Marland Kitchen  rOPINIRole (REQUIP) 3 MG tablet, Take 1 tablet 4 x /day for Restless Legs (Patient taking differently: Take 3 mg by mouth in the morning, at noon, in the evening, and at bedtime. ) .  tiZANidine (ZANAFLEX) 4 MG tablet, TAKE 1/2 TO 1 TABLET 2 TO 3 X /DAY . . . ONLY . . . IF NEEDED FOR MUSCLE SPASMS (Patient taking differently: Take 2 mg by mouth at bedtime as needed for muscle spasms. ) .  Continuous Blood Gluc Receiver (FREESTYLE LIBRE 14 DAY READER) DEVI, 1 Device by Does not apply route every 14 (fourteen) days. .  Continuous Blood Gluc Sensor (FREESTYLE LIBRE 14 DAY SENSOR) MISC, Apply 1 application topically every 14 (fourteen) days.  Past Medical History:  Diagnosis Date  . Anemia    hx (04/14/2016)  . Anxiety   . Arthritis    "severe in my back; hands; ankles" (04/14/2016)  . Atypical chest pain 01/24/2018   Midline pain absent supine "constant" daytime since 12/31/17 > resolved as of 02/20/2018 on gerd/ gas diet    . Basal cell carcinoma    "several burned off; one cut off"  . CAD in native artery    a. NSTEMI 04/2016 - s/p DES toLAD and LCx. PCI to LCx notable for microembolization during cath.  . Chest pain- reslved with stopping Brilinta now on Plaix 04/16/2016  . Chronic lower back pain   . CKD (chronic kidney disease), stage III   . Depression   . DJD (degenerative joint disease)   . Family history of adverse reaction to anesthesia    "half-sister used to get real sick" (04/14/2016)  . GERD (gastroesophageal reflux disease)   . History of gout   . Hyperlipidemia   . Hypertension   . IBS (irritable bowel syndrome)   . Ischemic cardiomyopathy    a. 04/2016: EF 40-50% by cath, 50-55% +WMA by echo.  . 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 ([place]) ~ 2015  . Morbid obesity ([place])   . NSTEMI (non-ST elevated myocardial infarction) ([place]) 04/14/2016  . OSA on CPAP   . Peripheral neuropathy   . Peripheral vascular disease ([place])   . RLS (restless legs syndrome)   . Spinal stenosis   . Type II diabetes mellitus ([place])   . Vitamin D deficiency      Allergies  Allergen Reactions  . Brilinta [Ticagrelor] Shortness Of Breath and Other (See Comments)    Chest pain (also)  . Aspirin Other (See Comments)    Can tolerate in small doses (is already on a blood thinner AND has kidney disease)  . Nsaids Other (See Comments)    Patient is taking a blood thinner and has kidney disease  . Minocycline Hcl Other (See Comments)    Welts  . Oruvail [Ketoprofen] Other (See Comments)    Has kidney disease and is taking a blood thinner  . Tricor [Fenofibrate]     Reaction unknown  . Vasotec [Enalaprilat]     Reaction unknown  . Zinc  Reaction unknown    ROS: all negative except above.   Physical Exam: Filed Weights   07/30/19 1156  Weight: 250 lb (113.4 kg)   BP 138/70   Pulse 62   Temp 98.1 F (36.7 C)   Wt 250 lb (113.4 kg)   SpO2 93%   BMI 47.24 kg/m  General appearance: alert, no  distress, WD/WN,  female HEENT: normocephalic, sclerae anicteric, TMs pearly, nares patent, no discharge or erythema, pharynx normal Oral cavity: MMM, no lesions Neck: supple, no lymphadenopathy, no thyromegaly, no masses Heart: RRR, normal S1, S2, no murmurs Lungs: CTA bilaterally, no wheezes, rhonchi, or rales Abdomen: +bs, soft, obese non tender, non distended, no masses, no hepatomegaly, no splenomegaly Musculoskeletal: nontender, no swelling, no obvious deformity Extremities: 1-2+ edema, no cyanosis, no clubbing Pulses: 2+ symmetric, upper and lower extremities, normal cap refill Neurological: alert, oriented x 3, CN2-12 intact, strength normal upper extremities and lower extremities, sensation decreased bilateral legs up to mid shin, DTRs 2+ throughout, no cerebellar signs, gait wide and antalgic Psychiatric: normal affect, behavior normal, pleasant  Vicie Mutters, PA-C 12:54 PM [facility]

## 2019-07-30 ENCOUNTER — Other Ambulatory Visit: Payer: Self-pay

## 2019-07-30 ENCOUNTER — Encounter: Payer: Self-pay | Admitting: Physician Assistant

## 2019-07-30 ENCOUNTER — Ambulatory Visit (INDEPENDENT_AMBULATORY_CARE_PROVIDER_SITE_OTHER): Payer: PPO | Admitting: Physician Assistant

## 2019-07-30 VITALS — BP 138/70 | HR 62 | Temp 98.1°F | Wt 250.0 lb

## 2019-07-30 DIAGNOSIS — IMO0002 Reserved for concepts with insufficient information to code with codable children: Secondary | ICD-10-CM

## 2019-07-30 DIAGNOSIS — R079 Chest pain, unspecified: Secondary | ICD-10-CM

## 2019-07-30 DIAGNOSIS — K551 Chronic vascular disorders of intestine: Secondary | ICD-10-CM | POA: Diagnosis not present

## 2019-07-30 DIAGNOSIS — R0609 Other forms of dyspnea: Secondary | ICD-10-CM

## 2019-07-30 DIAGNOSIS — G4733 Obstructive sleep apnea (adult) (pediatric): Secondary | ICD-10-CM

## 2019-07-30 DIAGNOSIS — N183 Chronic kidney disease, stage 3 unspecified: Secondary | ICD-10-CM

## 2019-07-30 DIAGNOSIS — E1165 Type 2 diabetes mellitus with hyperglycemia: Secondary | ICD-10-CM

## 2019-07-30 DIAGNOSIS — E1169 Type 2 diabetes mellitus with other specified complication: Secondary | ICD-10-CM | POA: Diagnosis not present

## 2019-07-30 DIAGNOSIS — I5032 Chronic diastolic (congestive) heart failure: Secondary | ICD-10-CM | POA: Diagnosis not present

## 2019-07-30 DIAGNOSIS — D61818 Other pancytopenia: Secondary | ICD-10-CM

## 2019-07-30 DIAGNOSIS — E1122 Type 2 diabetes mellitus with diabetic chronic kidney disease: Secondary | ICD-10-CM | POA: Diagnosis not present

## 2019-07-30 DIAGNOSIS — R05 Cough: Secondary | ICD-10-CM | POA: Diagnosis not present

## 2019-07-30 DIAGNOSIS — Z794 Long term (current) use of insulin: Secondary | ICD-10-CM

## 2019-07-30 DIAGNOSIS — R0600 Dyspnea, unspecified: Secondary | ICD-10-CM

## 2019-07-30 DIAGNOSIS — E785 Hyperlipidemia, unspecified: Secondary | ICD-10-CM

## 2019-07-30 DIAGNOSIS — D531 Other megaloblastic anemias, not elsewhere classified: Secondary | ICD-10-CM | POA: Diagnosis not present

## 2019-07-30 DIAGNOSIS — R059 Cough, unspecified: Secondary | ICD-10-CM

## 2019-07-30 DIAGNOSIS — I5033 Acute on chronic diastolic (congestive) heart failure: Secondary | ICD-10-CM | POA: Insufficient documentation

## 2019-07-30 MED ORDER — VICTOZA 18 MG/3ML ~~LOC~~ SOPN: 1.2000 mg | PEN_INJECTOR | Freq: Every morning | SUBCUTANEOUS | 3 refills | Status: DC

## 2019-07-30 MED ORDER — FREESTYLE LIBRE 14 DAY READER DEVI: 1.0000 | 4 refills | Status: DC

## 2019-07-30 MED ORDER — FREESTYLE LIBRE 14 DAY SENSOR MISC: 1.0000 "application " | 4 refills | Status: DC

## 2019-07-30 NOTE — Patient Instructions (Addendum)
Please check your sugars at home: check it in in the morning before food Check it before your dinner And for a while check it before bed while we are adjusting your medications  IT IS VERY IMPORTANT TO BRING THIS LOG IN to your office visits.   Take the victoza daily- once a day for your sugars While doing this you may need to cut back on your novolog 20 units 3 x a day- can decrease by 2-4 units for low sugars.   We will likely need to increase this but we will do this slowly because a low sugar is much more dangerous than a high sugar.    Your brain needs 2 things, oxygen and sugar, so lets make sure it gets both.  Please remember only take the NOVOLOG insulin WITH food   if you are sick or unable to eat DO NOT take your insulin   If at any time you have a question or concern, call the office or message [place] in [place].   Will suggest getting sleep apnea treated Will suggest getting CT lung Take the inhaler  Checking your blood, had some low blood work- depending on what it ssay, may refer to blood doctor or hematolgist  [place]  Can do steroid inhaler, NEED TO DO DAILY, this is NOT a rescue inhaler so if you are acutely short of breath please use your albuterol or call 911.  Do 1 puff ONCE a day.  Do before you brush your teeth OR wash your mouth afterwards.  IF YOU DO NOT [place] YOUR MOUTH OUT IT CAN CAUSE YEAST Can do 2 tsp vinegar with water and switch to help prevent yeast or help yeast in your mouth.   Go to the ER if any chest pain, shortness of breath, nausea, dizziness, severe HA, changes vision/speech   Cough, Adult Coughing is a reflex that clears your throat and your airways (respiratory system). Coughing helps to heal and protect your lungs. It is normal to cough occasionally, but a cough that happens with other symptoms or lasts a long time may be a sign of a condition that needs treatment. An acute cough may only last 2-3 weeks,  while a chronic cough may last 8 or more weeks. Coughing is commonly caused by:  Infection of the respiratory systemby viruses or bacteria.  Breathing in substances that irritate your lungs.  Allergies.  Asthma.  Mucus that runs down the back of your throat (postnasal drip).  Smoking.  Acid backing up from the stomach into the esophagus (gastroesophageal reflux).  Certain medicines.  Chronic lung problems.  Other medical conditions such as heart failure or a blood clot in the lung (pulmonary embolism). Follow these instructions at home: Medicines  Take over-the-counter and prescription medicines only as told by your health care provider.  Talk with your health care provider before you take a cough suppressant medicine. Lifestyle   Avoid cigarette smoke. Do not use any products that contain nicotine or tobacco, such as cigarettes, e-cigarettes, and chewing tobacco. If you need help quitting, ask your health care provider.  Drink enough fluid to keep your urine pale yellow.  Avoid caffeine.  Do not drink alcohol if your health care provider tells you not to drink. General instructions   Pay close attention to changes in your cough. Tell your health care provider about them.  Always cover your mouth when you cough.  Avoid things that make you cough, such as perfume, candles,  cleaning products, or campfire or tobacco smoke.  If the air is dry, use a cool mist vaporizer or humidifier in your bedroom or your home to help loosen secretions.  If your cough is worse at night, try to sleep in a semi-upright position.  Rest as needed.  Keep all follow-up visits as told by your health care provider. This is important. Contact a health care provider if you:  Have new symptoms.  Cough up pus.  Have a cough that does not get better after 2-3 weeks or gets worse.  Cannot control your cough with cough suppressant medicines and you are losing sleep.  Have pain that gets  worse or pain that is not helped with medicine.  Have a fever.  Have unexplained weight loss.  Have night sweats. Get help right away if:  You cough up blood.  You have difficulty breathing.  Your heartbeat is very fast. These symptoms may represent a serious problem that is an emergency. Do not wait to see if the symptoms will go away. Get medical help right away. Call your local emergency services (911 in the [place] Do not drive yourself to the hospital. Summary  Coughing is a reflex that clears your throat and your airways. It is normal to cough occasionally, but a cough that happens with other symptoms or lasts a long time may be a sign of a condition that needs treatment.  Take over-the-counter and prescription medicines only as told by your health care provider.  Always cover your mouth when you cough.  Contact a health care provider if you have new symptoms or a cough that does not get better after 2-3 weeks or gets worse. This information is not intended to replace advice given to you by your health care provider. Make sure you discuss any questions you have with your health care provider. Document Revised: 05/15/2018 Document Reviewed: 05/15/2018 Elsevier Patient Education  [employer].    At this time we do not have a plan to get the COVID vaccine in our office. You can go to [email or web address] to find out if you are eligible.  If you are eligible you can go to [email or web address]  to find a local place that is giving the shot.    [email or web address] also has all of this information if the sites above are not working for you.   There are also several waiting list that you can sign up for here are some below:  [place] go to [email or web address] to sign up for notification when additional vaccine appointments are available.   You can also sign up at [facility] for a wait list.   [facility] and [facility] have a wait list on line and you can sign up  for it there.     If you have further questions or concerns about the vaccine process, please visit [email or web address]  Individuals seeking information about the vaccines and state's phased distribution plan can learn more by going to - [email or web address]

## 2019-08-02 ENCOUNTER — Ambulatory Visit: Payer: Self-pay | Admitting: Physician Assistant

## 2019-08-02 LAB — RETICULOCYTES
ABS Retic: 104400 cells/uL — ABNORMAL HIGH (ref 20000–8000)
Retic Ct Pct: 3 %

## 2019-08-02 LAB — CBC WITH DIFFERENTIAL/PLATELET
Absolute Monocytes: 91 cells/uL — ABNORMAL LOW (ref 200–950)
Basophils Absolute: 10 cells/uL (ref 0–200)
Basophils Relative: 0.4 %
Eosinophils Absolute: 91 cells/uL (ref 15–500)
Eosinophils Relative: 3.8 %
HCT: 34.1 % — ABNORMAL LOW (ref 35.0–45.0)
Hemoglobin: 11.3 g/dL — ABNORMAL LOW (ref 11.7–15.5)
Lymphs Abs: 737 cells/uL — ABNORMAL LOW (ref 850–3900)
MCH: 32.5 pg (ref 27.0–33.0)
MCHC: 33.1 g/dL (ref 32.0–36.0)
MCV: 98 fL (ref 80.0–100.0)
MPV: 10.5 fL (ref 7.5–12.5)
Monocytes Relative: 3.8 %
Neutro Abs: 1471 cells/uL — ABNORMAL LOW (ref 1500–7800)
Neutrophils Relative %: 61.3 %
Platelets: 152 10*3/uL (ref 140–400)
RBC: 3.48 10*6/uL — ABNORMAL LOW (ref 3.80–5.10)
RDW: 13.1 % (ref 11.0–15.0)
Total Lymphocyte: 30.7 %
WBC: 2.4 10*3/uL — ABNORMAL LOW (ref 3.8–10.8)

## 2019-08-02 LAB — COMPLETE METABOLIC PANEL WITH GFR
AG Ratio: 1.6 (calc) (ref 1.0–2.5)
ALT: 9 U/L (ref 6–29)
AST: 10 U/L (ref 10–35)
Albumin: 3.7 g/dL (ref 3.6–5.1)
Alkaline phosphatase (APISO): 98 U/L (ref 37–153)
BUN/Creatinine Ratio: 19 (calc) (ref 6–22)
BUN: 25 mg/dL (ref 7–25)
CO2: 28 mmol/L (ref 20–32)
Calcium: 9.2 mg/dL (ref 8.6–10.4)
Chloride: 105 mmol/L (ref 98–110)
Creat: 1.34 mg/dL — ABNORMAL HIGH (ref 0.60–0.93)
GFR, Est African American: 44 mL/min/{1.73_m2} — ABNORMAL LOW (ref >=60)
GFR, Est Non African American: 38 mL/min/{1.73_m2} — ABNORMAL LOW (ref >=60)
Globulin: 2.3 g/dL (calc) (ref 1.9–3.7)
Glucose, Bld: 256 mg/dL — ABNORMAL HIGH (ref 65–99)
Potassium: 4.5 mmol/L (ref 3.5–5.3)
Sodium: 142 mmol/L (ref 135–146)
Total Bilirubin: 0.5 mg/dL (ref 0.2–1.2)
Total Protein: 6 g/dL — ABNORMAL LOW (ref 6.1–8.1)

## 2019-08-02 LAB — FOLATE RBC: RBC Folate: 923 ng/mL RBC (ref >280)

## 2019-08-02 LAB — METHYLMALONIC ACID, SERUM: Methylmalonic Acid, Quant: 334 nmol/L — ABNORMAL HIGH (ref 87–318)

## 2019-08-02 LAB — IRON, TOTAL/TOTAL IRON BINDING CAP
%SAT: 21 % (calc) (ref 16–45)
Iron: 80 ug/dL (ref 45–160)
TIBC: 390 mcg/dL (calc) (ref 250–450)

## 2019-08-02 LAB — VITAMIN B12: Vitamin B-12: >2000 pg/mL — ABNORMAL HIGH (ref 200–1100)

## 2019-08-06 NOTE — Progress Notes (Deleted)
MEDICARE ANNUAL WELLNESS VISIT AND FOLLOW UP  Assessment:   Essential hypertension - continue medications, DASH diet, exercise and monitor at home. Call if greater than 130/80.  -     CBC with Differential/Platelet -     BASIC METABOLIC PANEL WITH GFR -     Hepatic function panel -     TSH  NSTEMI (non-ST elevated myocardial infarction) (HCC) Control blood pressure, cholesterol, glucose, increase exercise.   Hypertensive heart disease without heart failure - continue medications, DASH diet, exercise and monitor at home. Call if greater than 130/80.  -     BASIC METABOLIC PANEL WITH GFR  CAD in native artery Control blood pressure, cholesterol, glucose, increase exercise.   OSA on CPAP Sleep apnea- continue CPAP, weight loss advised.   Controlled type 2 diabetes mellitus with stage 3 chronic kidney disease, without long-term current use of insulin ([place]) Discussed general issues about diabetes pathophysiology and management., Educational material distributed., Suggested low cholesterol diet., Encouraged aerobic exercise., Discussed foot care., Reminded to get yearly retinal exam. -     BASIC METABOLIC PANEL WITH GFR  Uncontrolled type 2 diabetes mellitus with stage 3 chronic kidney disease, with long-term current use of insulin ([place]) Discussed general issues about diabetes pathophysiology and management., Educational material distributed., Suggested low cholesterol diet., Encouraged aerobic exercise., Discussed foot care., Reminded to get yearly retinal exam. -     BASIC METABOLIC PANEL WITH GFR  CKD (chronic kidney disease), stage III Increase fluids, avoid NSAIDS, monitor sugars, will monitor - may need referral nephrology -     BASIC METABOLIC PANEL WITH GFR  Hyperlipidemia -continue medications, check lipids, decrease fatty foods, increase activity.  -     Lipid panel  Diverticulosis of large intestine without hemorrhage  History of colonic polyps  Morbid Obesity with  co morbidities - long discussion about weight loss, diet, and exercise  Vitamin D deficiency -     VITAMIN D 25 Hydroxy (Vit-D Deficiency, Fractures)  Medication management -     Magnesium  RLS (restless legs syndrome) Check iron  Medicare annual wellness visit, subsequent Declines MGM  Iron deficiency anemia, unspecified iron deficiency anemia type -     Iron and TIBC -     Ferritin  B12 Get on B12, may be causing some symptoms.   Depression Stop wellbutrin, ? If causing anxiety/symptoms, start on lexapro  Morbid obesity - follow up 3 months for progress monitoring - increase veggies, decrease carbs - long discussion about weight loss, diet, and exercise  Diabetic polyneuropathy associated with type 2 diabetes mellitus (HCC) -     Hemoglobin A1c Discussed disease progression and risks Discussed diet/exercise, weight management and risk modification  Type 2 diabetes mellitus with hyperlipidemia (HCC) -     Hemoglobin A1c check lipids decrease fatty foods increase activity.   Dry eye -     Sjogrens syndrome-A extractable nuclear antibody -     Sjogrens syndrome-B extractable nuclear antibody  Imbalance -     MR Brain W Wo Contrast; Future - get on B12 but with 3 episodes, 1 with confusion will get MRI to rule out TIA Close follow up 6 weeks She was informed to call 911 if she develop any new symptoms such as worsening headaches, episodes of blurred vision, double vision or complete loss of vision or speech difficulties or motor weakness.  Gout, unspecified cause, unspecified chronicity, unspecified site -     Uric acid - get back on allopurinol   Future  Appointments  Date Time Provider [place]  08/08/2019 10:30 AM Vicie Mutters, PA-C [facility] None  08/20/2019  8:45 AM Deberah Pelton, NP [facility]  11/05/2019  2:30 PM [name], MD [facility] None  05/14/2020 11:00 AM [name], MD [facility] None     Plan:   During  the course of the visit the patient was educated and counseled about appropriate screening and preventive services including:    Pneumococcal vaccine   Influenza vaccine  Td vaccine  Screening electrocardiogram  Screening mammography  Bone densitometry screening  Colorectal cancer screening  Diabetes screening  Glaucoma screening  Nutrition counseling   Advanced directives: given info/requested  Subjective:   Janet Choi is a 79 y.o. female who presents for Medicare Annual Wellness Visit and 3 month follow up on hypertension,diabetes, hyperlipidemia, vitamin D def, poor compliance.   Brought her friend Tami with her, quit job at [employer] but to severe hand pain, had injections with Dr. Grandville Silos. Dr. Berenice Primas for her lower back pain left radicular pain and left arm, had ESI this past Thursday with Dr. Mina Marble, helping some. She is still driving some, not at night. Declines MGM, willing to get colonoscopy  She states she was had an episode in Oct of dizziness and not knowing where she was while driving, and had 2 falls since Jan due to imbalance. She was having double vision and dizziness, states saw eye doctor for dry eyes and started on drops that have helped. She has had neg ANA, AntiDNA, RF, RNP.   Her blood pressure has been controlled at home, she is on verapamil, lasix, today [name] is   She does not workout. She denies chest pain, shortness of breath, dizziness.   She had NSTEMI with PCI LAD and LCx 04/2016, on plavix follows Cardio, Dr. Oval Linsey, on ASA.   She is on cholesterol medication, lipitor and zetia denies myalgias. Her cholesterol is at goal. The cholesterol last visit was:   Lab Results  Component Value Date   CHOL 127 07/25/2019   HDL 34 (L) 07/25/2019   LDLCALC 42 07/25/2019   TRIG 255 (H) 07/25/2019   CHOLHDL 3.7 07/25/2019   She has been working on diet and exercise for diabetes, she is on bASA and on ACE/ARB, she is on basaglar 70 units at night,  and novolog 16-20 units with meals. She is not checking her sugars still. She will take her morning insulin but will forget to take her dinner time sugar, she does not eat lunch and does not take any insulin at lunch. She admits to not taking insulin as directed and not having a schedule for eating/sleeping.  She has one touch ultra. She is following Dr. 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 Last A1C in the office was:  Lab Results  Component Value Date   HGBA1C 9.5 (H) 07/25/2019   Lab Results  Component Value Date   CREATININE 1.34 (H) 07/30/2019   BUN 25 07/30/2019   NA 142 07/30/2019   K 4.5 07/30/2019   CL 105 07/30/2019   CO2 28 07/30/2019   Last GFR, she does not see nephrology.  Lab Results  Component Value Date   GFRNONAA 38 (L) 07/30/2019   Patient is on Vitamin D supplement. Lab Results  Component Value Date   VD25OH 51 04/30/2019   BMI is There is no height or weight  on file to calculate BMI., she is working on diet and exercise and has done well. She states she just lost her best friend of 25 years, her sister (83 years old ) recently diagnosed with dementia. She has been depressed and worried about her memory. She states he is hungry all the time.  Wt Readings from Last 3 Encounters:  07/30/19 250 lb (113.4 kg)  07/24/19 245 lb (111.1 kg)  04/30/19 245 lb 9.6 oz (111.4 kg)    Names of Other Physician/Practitioners you currently use: 1. [facility]- here for primary care 2. Dr. Satira Sark, eye doctor, last visit 05/2018 3. Dr. Luretha Rued, dentist, q 6 months 4. Dr. Lorin Mercy, spinal stenosis.  Patient Care Team: [name], MD as PCP - General (Internal Medicine) Skeet Latch, MD as PCP - Cardiology (Cardiology) Marygrace Drought, MD as Consulting Physician (Ophthalmology) Inda Castle, MD (Inactive) as Consulting Physician  (Gastroenterology) Larey Dresser, MD as Consulting Physician (Cardiology) Nobie Putnam, MD as Consulting Physician (Oncology) Tania Ade, MD as Consulting Physician (Orthopedic Surgery)  Medication Review  Current Outpatient Medications (Endocrine & Metabolic):  .  insulin aspart (NOVOLOG FLEXPEN) 100 UNIT/ML FlexPen, Inject 50  to 75 units 3 x /day before meals as directed for Diabetes (Patient taking differently: Inject 20 Units into the skin 3 (three) times daily with meals. Inject 50  to 75 units 3 x /day before meals as directed for Diabetes) .  Insulin Glargine (BASAGLAR KWIKPEN) 100 UNIT/ML SOPN, Inject 70 units Daily for Diabetes (Patient taking differently: Inject 70 Units into the skin at bedtime. Inject 70 units Daily for Diabetes) .  liraglutide (VICTOZA) 18 MG/3ML SOPN, Inject 0.2 mLs (1.2 mg total) into the skin every morning.  Current Outpatient Medications (Cardiovascular):  .  atorvastatin (LIPITOR) 80 MG tablet, TAKE 1 TABLET (80 MG TOTAL) BY MOUTH DAILY AT 6 PM. (Patient taking differently: Take 80 mg by mouth every evening. ) .  furosemide (LASIX) 40 MG tablet, Take 40 mg by mouth as needed for fluid. Marland Kitchen  losartan (COZAAR) 50 MG tablet, Take 1 tablet Daily for BP & Diabetic Kidney Protection (Patient taking differently: Take 50 mg by mouth daily. ) .  metoprolol succinate (TOPROL-XL) 25 MG 24 hr tablet, Take 1 tablet (25 mg total) by mouth at bedtime. .  nitroGLYCERIN (NITROSTAT) 0.4 MG SL tablet, Place 1 tablet (0.4 mg total) under the tongue every 5 (five) minutes x 3 doses as needed for chest pain.   Current Outpatient Medications (Analgesics):  .  acetaminophen (TYLENOL) 325 MG tablet, Take 2 tablets (650 mg total) by mouth every 4 (four) hours as needed for headache or mild pain. Marland Kitchen  allopurinol (ZYLOPRIM) 100 MG tablet, TAKE 1 TABLET DAILY TO PREVENT GOUT (Patient taking differently: Take 100 mg by mouth daily. ) .  aspirin EC 81 MG EC tablet, Take 1 tablet (81  mg total) by mouth daily. .  colchicine 0.6 MG tablet, Take 2 tablets first day of gout flare, then one a day until gout flare is better (Patient not taking: Reported on 07/24/2019)  Current Outpatient Medications (Hematological):  .  ferrous sulfate 325 (65 FE) MG tablet, Take 325 mg by mouth daily with breakfast. .  vitamin B-12 (CYANOCOBALAMIN) 1000 MCG tablet, Take 1,000 mcg by mouth daily.  Current Outpatient Medications (Other):  Marland Kitchen  buPROPion (WELLBUTRIN XL) 300 MG 24 hr tablet, Take 1 tablet (300 mg total) by mouth daily. Take 1 tablet Daily for Mood .  Cholecalciferol ([employer] VIT D 5000 HIGH-POTENCY) 5000 units capsule, Take 5,000 Units by mouth 2 (two) times daily.  .  Continuous Blood Gluc Receiver (FREESTYLE LIBRE 14 DAY READER) DEVI, 1 Device by Does not apply route every 14 (fourteen) days. .  Continuous Blood Gluc Sensor (FREESTYLE LIBRE 14 DAY SENSOR) MISC, Apply 1 application topically every 14 (fourteen) days. .  diclofenac sodium (VOLTAREN) 1 % GEL, Apply 4 g topically 4 (four) times daily. (Patient taking differently: Apply 4 g topically daily as needed (For had pain). ) .  escitalopram (LEXAPRO) 20 MG tablet, Take 1 tablet (20 mg total) by mouth daily. (Patient taking differently: Take 30 mg by mouth daily. ) .  famotidine (PEPCID) 20 MG tablet, One at bedtime .  gabapentin (NEURONTIN) 100 MG capsule, Take 1 to 2 capsules 3 to 4 x /day as needed for Painful Diabetic Neuropathy (Patient taking differently: Take 200 mg by mouth in the morning, at noon, in the evening, and at bedtime. ) .  Insulin Pen Needle (BD PEN NEEDLE NANO U/F) 32G X 4 MM MISC, USE AS DIRECTED 4 TIMES A DAY .  ketoconazole (NIZORAL) 2 % cream, Apply 1 application topically 2 (two) times daily. .  Lancets (ONETOUCH ULTRASOFT) lancets, Use as instructed .  ONETOUCH VERIO test strip, USE AS INSTRUCTED TO CHECK SUGAR 2 TIMES DAILY. .  Polyethylene Glycol 400 (BLINK TEARS OP), Place 1 drop into both eyes daily as  needed. Marland Kitchen  rOPINIRole (REQUIP) 3 MG tablet, Take 1 tablet 4 x /day for Restless Legs (Patient taking differently: Take 3 mg by mouth in the morning, at noon, in the evening, and at bedtime. ) .  tiZANidine (ZANAFLEX) 4 MG tablet, TAKE 1/2 TO 1 TABLET 2 TO 3 X /DAY . . . ONLY . . . IF NEEDED FOR MUSCLE SPASMS (Patient taking differently: Take 2 mg by mouth at bedtime as needed for muscle spasms. )  Current Problems (verified) Patient Active Problem List   Diagnosis Date Noted  . Chronic diastolic heart failure ([place]) 07/30/2019  . Superior mesenteric artery stenosis ([place]) 07/30/2019  . Chest pain with moderate risk for cardiac etiology 07/24/2019  . DJD (degenerative joint disease) of knee 09/11/2018  . Diabetic polyneuropathy associated with type 2 diabetes mellitus ([place]) 06/27/2018  . Upper airway cough syndrome 01/24/2018  . DJD (degenerative joint disease), lumbosacral 06/02/2017  . DOE (dyspnea on exertion) 04/19/2016  . Anxiety 04/19/2016  . CAD S/P percutaneous coronary angioplasty   . History of non-ST elevation myocardial infarction (NSTEMI) 04/14/2016  . CKD (chronic kidney disease), stage III 04/14/2016  . Iron deficiency anemia 08/15/2015  . OSA on CPAP 04/16/2015  . RLS (restless legs syndrome) 03/20/2014  . Type 2 diabetes mellitus with hyperlipidemia ([place]) 03/20/2014  . Medication management 09/12/2013  . Morbid obesity due to excess calories ([place]) 05/14/2013  . Vitamin D deficiency 05/14/2013  . Dyslipidemia 02/24/2009  . Essential hypertension 02/24/2009  . Diverticulosis of large intestine 02/24/2009  . History of colonic polyps 02/24/2009    Screening Tests Immunization History  Administered Date(s) Administered  . DT (Pediatric) 04/14/2015  . Hepatitis B 07/09/1999, 08/09/1999, 01/09/2000  . Influenza, High Dose Seasonal PF 03/20/2014, 02/14/2017, 04/12/2018, 02/22/2019  . Influenza,inj,quad, With Preservative 04/26/2016  . Influenza-Unspecified 03/28/2015,  04/14/2015, 02/14/2017  . Pneumococcal Conjugate-13 03/20/2014  . Pneumococcal Polysaccharide-23 10/15/2009  . Td 03/19/2004    Preventative care: Last colonoscopy: 2010 with Dr. Deatra Ina Last mammogram: 2012 OVERDUE BUT DECLINES Last pap smear/pelvic exam:  remote [id number] [place] AB 2007 Echo 2017 EF [id number], diastolic grade 1 dysfunction. Cath 04/2016 EF 40-50% PFT 01/2018  EYE exam Jan 2020 [facility]  Prior vaccinations: TD or Tdap: 2016  Influenza: 2018 Pneumococcal: 2011  Prevnar 13 2015 Shingles/Zostavax: declines due to cost  History reviewed: allergies, current medications, past family history, past medical history, past social history, past surgical history and problem list  Allergies Allergies  Allergen Reactions  . Brilinta [Ticagrelor] Shortness Of Breath and Other (See Comments)    Chest pain (also)  . Aspirin Other (See Comments)    Can tolerate in small doses (is already on a blood thinner AND has kidney disease)  . Nsaids Other (See Comments)    Patient is taking a blood thinner and has kidney disease  . Minocycline Hcl Other (See Comments)    Welts  . Oruvail [Ketoprofen] Other (See Comments)    Has kidney disease and is taking a blood thinner  . Tricor [Fenofibrate]     Reaction unknown  . Vasotec [Enalaprilat]     Reaction unknown  . Zinc     Reaction unknown    SURGICAL HISTORY She  has a past surgical history that includes Total knee arthroplasty (Bilateral); Lumbar disc surgery (X 2); Appendectomy; Joint replacement; Back surgery; Melanoma excision (Left); Excision basal cell carcinoma (Left); Shoulder open rotator cuff repair (Right); Shoulder arthroscopy w/ rotator cuff repair (Left); Cataract extraction, bilateral (Bilateral); Cardiac catheterization (N/A, 04/15/2016); and Cardiac catheterization (N/A, 04/15/2016). FAMILY HISTORY Her family history includes AAA (abdominal aortic aneurysm) in her father; Heart disease in her mother and son; Kidney  disease in her father. SOCIAL HISTORY She  reports that she quit smoking about 35 years ago. Her smoking use included cigarettes. She has a 62.50 pack-year smoking history. She has quit using smokeless tobacco. She reports that she does not drink alcohol or use drugs.  MEDICARE WELLNESS OBJECTIVES: Physical activity:   Cardiac risk factors:   Depression/mood screen:   Depression screen [facility] 2/9 04/29/2019  Decreased Interest 0  Down, Depressed, Hopeless 0  PHQ - 2 Score 0  Some recent data might be hidden    ADLs:  In your present state of health, do you have any difficulty performing the following activities: 04/29/2019  Hearing? N  Vision? N  Difficulty concentrating or making decisions? N  Walking or climbing stairs? N  Dressing or bathing? N  Doing errands, shopping? N  Some recent data might be hidden     Cognitive Testing  Alert? Yes  Normal Appearance?Yes  Oriented to person? Yes  Place? Yes   Time? Yes  Recall of three objects?  Yes  Can perform simple calculations? Yes  Displays appropriate judgment?Yes  Can read the correct time from a watch face?Yes  EOL planning:     Objective:   There were no vitals taken for this visit. There is no height or weight on file to calculate BMI.  General appearance: alert, no distress, WD/WN,  female HEENT: normocephalic, sclerae anicteric, TMs pearly, nares patent, no discharge or erythema, pharynx normal Oral cavity: MMM, no lesions Neck: supple, no lymphadenopathy, no thyromegaly, no masses Heart: RRR, normal S1, S2, no murmurs Lungs: CTA bilaterally, no wheezes, rhonchi, or rales Abdomen: +bs, soft, obese non tender, non distended, no masses, no hepatomegaly, no splenomegaly Musculoskeletal: nontender, no swelling, no obvious deformity Extremities: 1-2+ edema, no cyanosis, no clubbing Pulses: 2+ symmetric, upper and lower extremities, normal cap refill Neurological: alert, oriented x 3, CN2-12 intact,  strength normal  upper extremities and lower extremities, sensation decreased bilateral legs up to mid shin, DTRs 2+ throughout, no cerebellar signs, gait wide and antalgic Psychiatric: normal affect, behavior normal, pleasant  Breast: defer Gyn: defer Rectal: defer  Medicare Attestation I have personally reviewed: The patient's medical and social history Their use of alcohol, tobacco or illicit drugs Their current medications and supplements The patient's functional ability including ADLs,fall risks, home safety risks, cognitive, and hearing and visual impairment Diet and physical activities Evidence for depression or mood disorders  The patient's weight, height, BMI, and visual acuity have been recorded in the chart.  I have made referrals, counseling, and provided education to the patient based on review of the above and I have provided the patient with a written personalized care plan for preventive services.     Vicie Mutters, PA-C   08/06/2019

## 2019-08-07 DIAGNOSIS — E1122 Type 2 diabetes mellitus with diabetic chronic kidney disease: Secondary | ICD-10-CM | POA: Insufficient documentation

## 2019-08-07 DIAGNOSIS — E119 Type 2 diabetes mellitus without complications: Secondary | ICD-10-CM | POA: Insufficient documentation

## 2019-08-07 DIAGNOSIS — E1159 Type 2 diabetes mellitus with other circulatory complications: Secondary | ICD-10-CM | POA: Insufficient documentation

## 2019-08-07 DIAGNOSIS — I2510 Atherosclerotic heart disease of native coronary artery without angina pectoris: Secondary | ICD-10-CM | POA: Insufficient documentation

## 2019-08-08 ENCOUNTER — Ambulatory Visit: Payer: Self-pay | Admitting: Physician Assistant

## 2019-08-19 NOTE — Progress Notes (Deleted)
Cardiology Clinic Note   Patient Name: [name] Date of Encounter: 08/19/2019  Primary Care Provider:  [name], MD Primary Cardiologist:  Skeet Latch, MD  Patient Profile    Janet Choi 79 year old female presents today for follow-up of her chest pain.  Past Medical History    Past Medical History:  Diagnosis Date   Anemia    hx (04/14/2016)   Anxiety    Arthritis    "severe in my back; hands; ankles" (04/14/2016)   Atypical chest pain 01/24/2018   Midline pain absent supine "constant" daytime since 12/31/17 > resolved as of 02/20/2018 on gerd/ gas diet    Basal cell carcinoma    "several burned off; one cut off"   CAD in native artery    a. NSTEMI 04/2016 - s/p DES toLAD and LCx. PCI to LCx notable for microembolization during cath.   Chest pain- reslved with stopping Brilinta now on Plaix 04/16/2016   Chronic lower back pain    CKD (chronic kidney disease), stage III    Depression    DJD (degenerative joint disease)    Family history of adverse reaction to anesthesia    "half-sister used to get real sick" (04/14/2016)   GERD (gastroesophageal reflux disease)    History of gout    Hyperlipidemia    Hypertension    IBS (irritable bowel syndrome)    Ischemic cardiomyopathy    a. 04/2016: EF 40-50% by cath, 50-55% +WMA by echo.   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 ([place]) ~ 2015   Morbid obesity ([place])    NSTEMI (non-ST elevated myocardial infarction) ([place]) 04/14/2016   OSA on CPAP    Peripheral neuropathy    Peripheral vascular disease (HCC)    RLS (restless legs syndrome)    Spinal stenosis    Type II diabetes mellitus (HCC)    Vitamin D deficiency    Past Surgical History:  Procedure Laterality Date   APPENDECTOMY     BACK SURGERY     BASAL CELL CARCINOMA EXCISION Left    leg   CARDIAC CATHETERIZATION N/A 04/15/2016   Procedure: Left Heart Cath and Coronary Angiography;  Surgeon: Belva Crome, MD;   Location: [facility];  Service: Cardiovascular;  Laterality: N/A;   CARDIAC CATHETERIZATION N/A 04/15/2016   Procedure: Coronary Stent Intervention;  Surgeon: Belva Crome, MD;  Location: [facility];  Service: Cardiovascular;  Laterality: N/A;  Mid LAD Mid CFX   CATARACT EXTRACTION, BILATERAL Bilateral    JOINT REPLACEMENT     LUMBAR DISC SURGERY  X 2   MELANOMA EXCISION Left    "towards the back of my neck"   SHOULDER ARTHROSCOPY W/ ROTATOR CUFF REPAIR Left    SHOULDER OPEN ROTATOR CUFF REPAIR Right    TOTAL KNEE ARTHROPLASTY Bilateral     Allergies  Allergies  Allergen Reactions   Brilinta [Ticagrelor] Shortness Of Breath and Other (See Comments)    Chest pain (also)   Aspirin Other (See Comments)    Can tolerate in small doses (is already on a blood thinner AND has kidney disease)   Nsaids Other (See Comments)    Patient is taking a blood thinner and has kidney disease   Minocycline Hcl Other (See Comments)    Welts   Oruvail [Ketoprofen] Other (See Comments)    Has kidney disease and is taking a blood thinner   Tricor [Fenofibrate]     Reaction unknown   Vasotec [Enalaprilat]  Reaction unknown   Zinc     Reaction unknown    History of Present Illness    Ms. Groshans has a PMH of NSTEMI 2017 with PCI to the LAD and LCx, hypertensive heart disease, obstructive sleep apnea, diabetes mellitus, HTN, HLD, morbid obesity, IBS, GERD, CKD stage III.  She presented to the emergency department on 07/24/2019 with several days of dyspnea on exertion, fatigue, and chest pressure.  She stated that she had not been taking care of herself very well since COVID-19 started.  She has stopped walking in her exertion level have been poor.  She admitted to sedentary type lifestyle.  Her diet had been poor and she has been eating mostly snack type foods.  Her weight has increased 10 pounds over the last year.  She noted lower extremity edema through the day that would  dissipate overnight.  She admitted to snoring and had been told that she had sleep apnea however, she had not been able to wear her mask.  She also indicated that she had not been able to wear oxygen at night which had once been discussed as an option for her.  She indicated that her oxygen saturation would drop down to 88% when she relaxes.  She indicated that 3 days prior to her ED visit she had developed chest pain.  She took nitroglycerin which improved and resolved her pain.  She has been coughing and coughing up clear type phlegm.  She denied fever and chills.  She indicated that her appetite had been poor but that she had not lost her sense of taste or smell.  She indicated that she developed another bout of chest pain on 3/15 and that she generally did not feel well.  When she presented to the emergency department on 07/24/2019 she indicated that her chest pain was much more severe rating it at 9 out of 10.  She was given nitroglycerin by EMS and stated she felt like it helped.  She also received 81 mg of aspirin x4.  Her pain then subsided to 2 out of 10.  She underwent a nuclear stress test 07/25/2019 which was negative for reversible ischemia.  Her troponins were negative x2 and her EKG was nonischemic.  She was discharged on 07/25/2019 metoprolol succinate 25 mg was added to her medication regimen.  She was continued on her aspirin, statin, and ARB.  She presents the clinic today for follow-up evaluation and states***  *** denies chest pain, shortness of breath, lower extremity edema, fatigue, palpitations, melena, hematuria, hemoptysis, diaphoresis, weakness, presyncope, syncope, orthopnea, and PND.   Home Medications    Prior to Admission medications   Medication Sig Start Date End Date Taking? Authorizing Provider  acetaminophen (TYLENOL) 325 MG tablet Take 2 tablets (650 mg total) by mouth every 4 (four) hours as needed for headache or mild pain. 04/20/16   Isaiah Serge, NP  allopurinol  (ZYLOPRIM) 100 MG tablet TAKE 1 TABLET DAILY TO PREVENT GOUT Patient taking differently: Take 100 mg by mouth daily.  03/11/19   [name], MD  aspirin EC 81 MG EC tablet Take 1 tablet (81 mg total) by mouth daily. 04/21/16   Isaiah Serge, NP  atorvastatin (LIPITOR) 80 MG tablet TAKE 1 TABLET (80 MG TOTAL) BY MOUTH DAILY AT 6 PM. Patient taking differently: Take 80 mg by mouth every evening.  04/30/19   Skeet Latch, MD  buPROPion (WELLBUTRIN XL) 300 MG 24 hr tablet Take 1 tablet (300 mg total)  by mouth daily. Take 1 tablet Daily for Mood 03/10/19   [name], MD  Cholecalciferol ([employer] VIT D 5000 HIGH-POTENCY) 5000 units capsule Take 5,000 Units by mouth 2 (two) times daily.     [provider]  colchicine 0.6 MG tablet Take 2 tablets first day of gout flare, then one a day until gout flare is better Patient not taking: Reported on 07/24/2019 01/20/17 07/03/18  [name], MD  Continuous Blood Gluc Receiver (FREESTYLE LIBRE 14 DAY READER) DEVI 1 Device by Does not apply route every 14 (fourteen) days. 07/30/19   Vicie Mutters, PA-C  Continuous Blood Gluc Sensor (FREESTYLE LIBRE 14 DAY SENSOR) MISC Apply 1 application topically every 14 (fourteen) days. 07/30/19   Vicie Mutters, PA-C  diclofenac sodium (VOLTAREN) 1 % GEL Apply 4 g topically 4 (four) times daily. Patient taking differently: Apply 4 g topically daily as needed (For had pain).  01/18/18   Vicie Mutters, PA-C  escitalopram (LEXAPRO) 20 MG tablet Take 1 tablet (20 mg total) by mouth daily. Patient taking differently: Take 30 mg by mouth daily.  07/02/19 07/01/20  Vicie Mutters, PA-C  famotidine (PEPCID) 20 MG tablet One at bedtime 01/23/18   Tanda Rockers, MD  ferrous sulfate 325 (65 FE) MG tablet Take 325 mg by mouth daily with breakfast.    [provider]  furosemide (LASIX) 40 MG tablet Take 40 mg by mouth as needed for fluid.    [provider]  gabapentin (NEURONTIN) 100 MG  capsule Take 1 to 2 capsules 3 to 4 x /day as needed for Painful Diabetic Neuropathy Patient taking differently: Take 200 mg by mouth in the morning, at noon, in the evening, and at bedtime.  01/01/19   [name], MD  insulin aspart (NOVOLOG FLEXPEN) 100 UNIT/ML FlexPen Inject 50  to 75 units 3 x /day before meals as directed for Diabetes Patient taking differently: Inject 20 Units into the skin 3 (three) times daily with meals. Inject 50  to 75 units 3 x /day before meals as directed for Diabetes 08/17/18   [name], MD  Insulin Glargine [facility]) 100 UNIT/ML SOPN Inject 70 units Daily for Diabetes Patient taking differently: Inject 70 Units into the skin at bedtime. Inject 70 units Daily for Diabetes 04/30/19   [name], MD  Insulin Pen Needle (BD PEN NEEDLE NANO U/F) 32G X 4 MM MISC USE AS DIRECTED 4 TIMES A DAY 02/22/19   [name], MD  ketoconazole (NIZORAL) 2 % cream Apply 1 application topically 2 (two) times daily. 07/02/19   Vicie Mutters, PA-C  Lancets [facility] ULTRASOFT) lancets Use as instructed 04/27/16   Philemon Kingdom, MD  liraglutide (VICTOZA) 18 MG/3ML SOPN Inject 0.2 mLs (1.2 mg total) into the skin every morning. 07/30/19   Vicie Mutters, PA-C  losartan (COZAAR) 50 MG tablet Take 1 tablet Daily for BP & Diabetic Kidney Protection Patient taking differently: Take 50 mg by mouth daily.  03/10/19   [name], MD  metoprolol succinate (TOPROL-XL) 25 MG 24 hr tablet Take 1 tablet (25 mg total) by mouth at bedtime. 07/25/19   [facility], Tami Lin, PA  nitroGLYCERIN (NITROSTAT) 0.4 MG SL tablet Place 1 tablet (0.4 mg total) under the tongue every 5 (five) minutes x 3 doses as needed for chest pain. 10/04/17   [name], MD  ONETOUCH VERIO test strip USE AS INSTRUCTED TO CHECK SUGAR 2 TIMES DAILY. 09/21/17   Philemon Kingdom, MD  Polyethylene Glycol 400 (BLINK TEARS  OP) Place 1 drop into both eyes daily as needed.    [provider]  rOPINIRole (REQUIP) 3 MG tablet Take 1 tablet 4 x /day for Restless Legs Patient taking differently: Take 3 mg by mouth in the morning, at noon, in the evening, and at bedtime.  03/25/19   [name], MD  tiZANidine (ZANAFLEX) 4 MG tablet TAKE 1/2 TO 1 TABLET 2 TO 3 X /DAY . . . ONLY . . . IF NEEDED FOR MUSCLE SPASMS Patient taking differently: Take 2 mg by mouth at bedtime as needed for muscle spasms.  07/02/19   Liane Comber, NP  vitamin B-12 (CYANOCOBALAMIN) 1000 MCG tablet Take 1,000 mcg by mouth daily.    [provider]    Family History    Family History  Problem Relation Age of Onset   Heart disease Mother    Kidney disease Father    AAA (abdominal aortic aneurysm) Father    Heart disease Son    Colon cancer Neg Hx    Colon polyps Neg Hx    Esophageal cancer Neg Hx    Pancreatic cancer Neg Hx    Stomach cancer Neg Hx    Liver disease Neg Hx    Diabetes Neg Hx    She indicated that her mother is deceased. She indicated that her father is deceased. She indicated that her brother is deceased. She indicated that her maternal grandmother is deceased. She indicated that her maternal grandfather is deceased. She indicated that her paternal grandmother is deceased. She indicated that her paternal grandfather is deceased. She indicated that the status of her son is unknown. She indicated that the status of her neg hx is unknown.  Social History    Social History   Socioeconomic History   Marital status: Widowed    Spouse name: Not on file   Number of children: Not on file   Years of education: Not on file   Highest education level: Not on file  Occupational History   Not on file  Tobacco Use   Smoking status: Former Smoker    Packs/day: 2.50    Years: 25.00    Pack years: 62.50    Types: Cigarettes    Quit date: 11/11/1983    Years since quitting: 35.7   Smokeless tobacco: Former Network engineer and Sexual Activity   Alcohol use: No     Alcohol/week: 0.0 standard drinks   Drug use: No   Sexual activity: Never  Other Topics Concern   Not on file  Social History Narrative   Not on file   Social Determinants of Health   Financial Resource Strain:    Difficulty of Paying Living Expenses:   Food Insecurity:    Worried About Charity fundraiser in the Last Year:    Arboriculturist in the Last Year:   Transportation Needs:    Film/video editor (Medical):    Lack of Transportation (Non-Medical):   Physical Activity:    Days of Exercise per Week:    Minutes of Exercise per Session:   Stress:    Feeling of Stress :   Social Connections:    Frequency of Communication with Friends and Family:    Frequency of Social Gatherings with Friends and Family:    Attends Religious Services:    Active Member of Clubs or Organizations:    Attends Archivist Meetings:    Marital Status:   Intimate Partner Violence:  Fear of Current or Ex-Partner:    Emotionally Abused:    Physically Abused:    Sexually Abused:      Review of Systems    General:  No chills, fever, night sweats or weight changes.  Cardiovascular:  No chest pain, dyspnea on exertion, edema, orthopnea, palpitations, paroxysmal nocturnal dyspnea. Dermatological: No rash, lesions/masses Respiratory: No cough, dyspnea Urologic: No hematuria, dysuria Abdominal:   No nausea, vomiting, diarrhea, bright red blood per rectum, melena, or hematemesis Neurologic:  No visual changes, wkns, changes in mental status. All other systems reviewed and are otherwise negative except as noted above.  Physical Exam    VS:  There were no vitals taken for this visit. , BMI There is no height or weight on file to calculate BMI. GEN: Well nourished, well developed, in no acute distress. HEENT: normal. Neck: Supple, no JVD, carotid bruits, or masses. Cardiac: RRR, no murmurs, rubs, or gallops. No clubbing, cyanosis, edema.  Radials/DP/PT 2+  and equal bilaterally.  Respiratory:  Respirations regular and unlabored, clear to auscultation bilaterally. GI: Soft, nontender, nondistended, BS + x 4. MS: no deformity or atrophy. Skin: warm and dry, no rash. Neuro:  Strength and sensation are intact. Psych: Normal affect.  Accessory Clinical Findings    ECG personally reviewed by me today- *** - No acute changes  EKG 07/25/2019 Sinus rhythm left axis deviation anteriolateral infarct undetermined age 12 bpm  Cardiac catheterization 04/15/2016  Non-ST elevation myocardial infarction with identification of [id number] segmental mid LAD stenosis and focal eccentric 95-99% mid circumflex stenosis.  The first diagonal is small and contains segmental 80% stenosis. The coronaries are otherwise widely patent.  Successful drug-eluting stent using Onyx Resolute to reduce a 95% mid LAD stenosis to 0% with TIMI grade 3 flow. Final stent diameter 3.0 mm.  Successful drug-eluting stent using Onyx Resolute to reduce a 95% mid circumflex stenosis to 0% with TIMI grade 3 flow. Post stent diameter 3.5 mm. Intervention on this vessel was, complicated by microembolization to the the most distal obtuse marginal beyond a bifurcation.  Mild hypokinesis of the anterior wall. EF 40-50%.  RECOMMENDATIONS:   Brilinta and aspirin times at least 12 months.  We discussed high-intensity statin therapy. She is willing to restart therapy.  Aggrastat infusion for 18 hours due to microembolization.  IV nitroglycerin for 12 hours.  Potential discharge tomorrow assuming stable clinical condition without significant rebound and cardiac markers.  Diagnostic Dominance: Co-dominant  Intervention     Nuclear stress test 07/25/2019 Small focus of mild ischemia in anterior septal wall.  No additional evidence of reversible ischemia.  No evidence of infarction  Normal left ventricular wall motion  Left ventricular ejection fraction 56%  Noninvasive risk  stratification-low to intermediate   Assessment & Plan   1.  Chest pain-presented to the emergency department on 07/24/2019.  Negative troponins, EKG sinus rhythm, nuclear stress test showed LVEF 56% with no evidence of infarction, low to intermediate risk.  Metoprolol succinate 25 mg added to regimen along with aspirin, statin, and ARB. Continue metoprolol succinate 25 mg Continue aspirin 81 mg Continue losartan 50 mg tablet daily Continue nitroglycerin 0.4 mg sublingual as needed Heart healthy low-sodium diet Increase physical activity as tolerated  Hyperlipidemia-LDL 42 on 07/25/2019 Continue atorvastatin 80 mg daily  Dyspnea on exertion-no increased work of breathing today.  Chest x-ray negative for acute findings.  Believed to be secondary to OSH and financial stress. Increase physical activity as tolerated Heart healthy low-sodium diet Continue weight  loss Stress reduction handout given  Disposition: Follow-up with Dr. Oval Linsey in 3 months.  Jossie Ng. [facility] [place] [address] Office [phone] Fax [phone]

## 2019-08-20 ENCOUNTER — Ambulatory Visit: Payer: PPO | Admitting: General Practice

## 2019-08-20 ENCOUNTER — Other Ambulatory Visit: Payer: Self-pay | Admitting: Adult Health

## 2019-08-28 ENCOUNTER — Ambulatory Visit
Admission: RE | Admit: 2019-08-28 | Discharge: 2019-08-28 | Disposition: A | Discharge status: Home / Self Care | Payer: PPO | Source: Ambulatory Visit | Attending: Physician Assistant | Admitting: Physician Assistant

## 2019-08-28 ENCOUNTER — Other Ambulatory Visit: Payer: Self-pay

## 2019-08-28 DIAGNOSIS — R05 Cough: Secondary | ICD-10-CM | POA: Diagnosis not present

## 2019-08-28 DIAGNOSIS — R0600 Dyspnea, unspecified: Secondary | ICD-10-CM | POA: Diagnosis not present

## 2019-08-28 DIAGNOSIS — R059 Cough, unspecified: Secondary | ICD-10-CM

## 2019-08-29 ENCOUNTER — Encounter: Payer: Self-pay | Admitting: Physician Assistant

## 2019-08-29 DIAGNOSIS — J42 Unspecified chronic bronchitis: Secondary | ICD-10-CM | POA: Insufficient documentation

## 2019-08-29 DIAGNOSIS — R911 Solitary pulmonary nodule: Secondary | ICD-10-CM | POA: Insufficient documentation

## 2019-09-02 NOTE — Progress Notes (Signed)
Cardiology Clinic Note   Patient Name: Janet Choi Date of Encounter: 09/03/2019  Primary Care Provider:  [name], MD Primary Cardiologist:  Skeet Latch, MD  Patient Profile    Janet Choi 79 year old female presents today for follow-up evaluation of her chest pain.  Past Medical History    Past Medical History:  Diagnosis Date  . Anemia    hx (04/14/2016)  . Anxiety   . Arthritis    "severe in my back; hands; ankles" (04/14/2016)  . Atypical chest pain 01/24/2018   Midline pain absent supine "constant" daytime since 12/31/17 > resolved as of 02/20/2018 on gerd/ gas diet   . Basal cell carcinoma    "several burned off; one cut off"  . CAD in native artery    a. NSTEMI 04/2016 - s/p DES toLAD and LCx. PCI to LCx notable for microembolization during cath.  . Chest pain- reslved with stopping Brilinta now on Plaix 04/16/2016  . Chronic lower back pain   . CKD (chronic kidney disease), stage III   . Depression   . DJD (degenerative joint disease)   . Family history of adverse reaction to anesthesia    "half-sister used to get real sick" (04/14/2016)  . GERD (gastroesophageal reflux disease)   . History of gout   . Hyperlipidemia   . Hypertension   . IBS (irritable bowel syndrome)   . Ischemic cardiomyopathy    a. 04/2016: EF 40-50% by cath, 50-55% +WMA by echo.  . 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 ([place]) ~ 2015  . Morbid obesity ([place])   . NSTEMI (non-ST elevated myocardial infarction) ([place]) 04/14/2016  . OSA on CPAP   . Peripheral neuropathy   . Peripheral vascular disease ([place])   . RLS (restless legs syndrome)   . Spinal stenosis   . Type II diabetes mellitus ([place])   . Vitamin D deficiency    Past Surgical History:  Procedure Laterality Date  . APPENDECTOMY    . BACK SURGERY    . BASAL CELL CARCINOMA EXCISION Left    leg  . CARDIAC CATHETERIZATION N/A 04/15/2016   Procedure: Left Heart Cath and Coronary Angiography;  Surgeon: Belva Crome, MD;  Location: [facility];  Service: Cardiovascular;  Laterality: N/A;  . CARDIAC CATHETERIZATION N/A 04/15/2016   Procedure: Coronary Stent Intervention;  Surgeon: Belva Crome, MD;  Location: [facility];  Service: Cardiovascular;  Laterality: N/A;  Mid LAD Mid CFX  . CATARACT EXTRACTION, BILATERAL Bilateral   . JOINT REPLACEMENT    . LUMBAR DISC SURGERY  X 2  . MELANOMA EXCISION Left    "towards the back of my neck"  . SHOULDER ARTHROSCOPY W/ ROTATOR CUFF REPAIR Left   . SHOULDER OPEN ROTATOR CUFF REPAIR Right   . TOTAL KNEE ARTHROPLASTY Bilateral     Allergies  Allergies  Allergen Reactions  . Brilinta [Ticagrelor] Shortness Of Breath and Other (See Comments)    Chest pain (also)  . Aspirin Other (See Comments)    Can tolerate in small doses (is already on a blood thinner AND has kidney disease)  . Nsaids Other (See Comments)    Patient is taking a blood thinner and has kidney disease  . Minocycline Hcl Other (See Comments)    Welts  . Oruvail [Ketoprofen] Other (See Comments)    Has kidney disease and is taking a blood thinner  . Tricor [Fenofibrate]     Reaction unknown  . Vasotec [  Enalaprilat]     Reaction unknown  . Zinc     Reaction unknown    History of Present Illness    [name] has a PMH of NSTEMI 2017 with PCI of the LAD and circumflex, hypertension, OSA, diabetes, HLD, morbid obesity, IBS, GERD, and CKD stage III.  She presented to the emergency department on 3/16 and was discharged on 07/25/2019.  Three days prior to her admission she developed chest pain.  She described it as a pressure and took a nitroglycerin which relieved the pain.  She noticed poor appetite and increased shortness of breath.  She indicated that she felt very " lousy" on the morning of 07/24/2019 and decided to present to the emergency department.  She described her chest pain records more severe 9/10 on 07/24/2019 as well.  She called EMS and was given 81 mg of  aspirin x4 and 2 sublingual nitroglycerin.  Her pain decreased to 2 out of 10.  She then received another nitroglycerin in the emergency department.  She stated that she had not taken very good care of herself during the COVID-19 pandemic.  She had stopped exercising.  Her diet had been poor and she had been mostly eating snacks.  She stated that she had gained about 10 pounds in the last year.  She indicated that she had lower extremity edema through the day that dissipated overnight.  She was not wearing her home oxygen at night.  She indicated that her oxygen saturation would drop down to 88% while she was relaxed.  She was admitted to telemetry had no changes in her EKG.  Her high-sensitivity troponins were negative x2.  She then underwent nuclear stress test 07/25/2019 which was negative for reversible ischemia.  She was started on metoprolol succinate 25 mg and her aspirin, statin, and ARB were continued.  She presents the clinic today for further evaluation and states she has noticed increased dyspnea on exertion since last fall.  She was given antibiotics this winter for sinus infection and states she has noticed more increased work of breathing since that time.  She indicated that she was recently at the beach.  Her house has 15 stairs that she has to climb to get into the house.  She states that by the end of the week she was able to get up and down the stairs better and had less shortness of breath.  She has been prescribed a budesonide inhaler from her PCP but was not given a bronchodilator.  Her stress test from her hospital admission was reviewed and she is pleased to know that her heart function has not changed since her NSTEMI in 2017.  I will give her the salty 6, have her increase her physical activity as tolerated, have her return to her PCP for further evaluation of her breathing, and have her return for follow-up in 6 months.  Today she denies chest pain, increased shortness of breath,  lower extremity edema, fatigue, palpitations, melena, hematuria, hemoptysis, diaphoresis, weakness, presyncope, syncope, orthopnea, and PND.   Home Medications    Prior to Admission medications   Medication Sig Start Date End Date Taking? Authorizing Provider  acetaminophen (TYLENOL) 325 MG tablet Take 2 tablets (650 mg total) by mouth every 4 (four) hours as needed for headache or mild pain. 04/20/16   Isaiah Serge, NP  allopurinol (ZYLOPRIM) 100 MG tablet TAKE 1 TABLET DAILY TO PREVENT GOUT Patient taking differently: Take 100 mg by mouth daily.  03/11/19  [name], MD  aspirin EC 81 MG EC tablet Take 1 tablet (81 mg total) by mouth daily. 04/21/16   Isaiah Serge, NP  atorvastatin (LIPITOR) 80 MG tablet TAKE 1 TABLET (80 MG TOTAL) BY MOUTH DAILY AT 6 PM. Patient taking differently: Take 80 mg by mouth every evening.  04/30/19   Skeet Latch, MD  buPROPion (WELLBUTRIN XL) 300 MG 24 hr tablet Take 1 tablet (300 mg total) by mouth daily. Take 1 tablet Daily for Mood 03/10/19   [name], MD  Cholecalciferol ([employer] VIT D 5000 HIGH-POTENCY) 5000 units capsule Take 5,000 Units by mouth 2 (two) times daily.     [provider]  colchicine 0.6 MG tablet Take 2 tablets first day of gout flare, then one a day until gout flare is better Patient not taking: Reported on 07/24/2019 01/20/17 07/03/18  [name], MD  Continuous Blood Gluc Receiver (FREESTYLE LIBRE 14 DAY READER) DEVI 1 Device by Does not apply route every 14 (fourteen) days. 07/30/19   Vicie Mutters, PA-C  Continuous Blood Gluc Sensor (FREESTYLE LIBRE 14 DAY SENSOR) MISC Apply 1 application topically every 14 (fourteen) days. 07/30/19   Vicie Mutters, PA-C  diclofenac sodium (VOLTAREN) 1 % GEL Apply 4 g topically 4 (four) times daily. Patient taking differently: Apply 4 g topically daily as needed (For had pain).  01/18/18   Vicie Mutters, PA-C  escitalopram (LEXAPRO) 20 MG tablet Take 1 tablet (20 mg  total) by mouth daily. Patient taking differently: Take 30 mg by mouth daily.  07/02/19 07/01/20  Vicie Mutters, PA-C  famotidine (PEPCID) 20 MG tablet One at bedtime 01/23/18   Tanda Rockers, MD  ferrous sulfate 325 (65 FE) MG tablet Take 325 mg by mouth daily with breakfast.    [provider]  furosemide (LASIX) 40 MG tablet Take 40 mg by mouth as needed for fluid.    [provider]  gabapentin (NEURONTIN) 100 MG capsule Take 1 to 2 capsules 3 to 4 x /day as needed for Painful Diabetic Neuropathy Patient taking differently: Take 200 mg by mouth in the morning, at noon, in the evening, and at bedtime.  01/01/19   [name], MD  insulin aspart (NOVOLOG FLEXPEN) 100 UNIT/ML FlexPen Inject 50  to 75 units 3 x /day before meals as directed for Diabetes Patient taking differently: Inject 20 Units into the skin 3 (three) times daily with meals. Inject 50  to 75 units 3 x /day before meals as directed for Diabetes 08/17/18   [name], MD  Insulin Glargine [facility]) 100 UNIT/ML SOPN Inject 70 units Daily for Diabetes Patient taking differently: Inject 70 Units into the skin at bedtime. Inject 70 units Daily for Diabetes 04/30/19   [name], MD  Insulin Pen Needle (BD PEN NEEDLE NANO U/F) 32G X 4 MM MISC USE AS DIRECTED 4 TIMES A DAY 02/22/19   [name], MD  ketoconazole (NIZORAL) 2 % cream Apply 1 application topically 2 (two) times daily. 07/02/19   Vicie Mutters, PA-C  Lancets [facility] ULTRASOFT) lancets Use as instructed 04/27/16   Philemon Kingdom, MD  liraglutide (VICTOZA) 18 MG/3ML SOPN Inject 0.2 mLs (1.2 mg total) into the skin every morning. 07/30/19   Vicie Mutters, PA-C  losartan (COZAAR) 50 MG tablet Take 1 tablet Daily for BP & Diabetic Kidney Protection Patient taking differently: Take 50 mg by mouth daily.  03/10/19   [name], MD  metoprolol succinate (TOPROL-XL) 25 MG 24 hr tablet Take  1 tablet (25 mg total) by mouth at  bedtime. 07/25/19   [facility], Tami Lin, PA  nitroGLYCERIN (NITROSTAT) 0.4 MG SL tablet Place 1 tablet (0.4 mg total) under the tongue every 5 (five) minutes x 3 doses as needed for chest pain. 10/04/17   [name], MD  ONETOUCH VERIO test strip USE AS INSTRUCTED TO CHECK SUGAR 2 TIMES DAILY. 09/21/17   Philemon Kingdom, MD  Polyethylene Glycol 400 (BLINK TEARS OP) Place 1 drop into both eyes daily as needed.    [provider]  rOPINIRole (REQUIP) 3 MG tablet Take 1 tablet 4 x /day for Restless Legs Patient taking differently: Take 3 mg by mouth in the morning, at noon, in the evening, and at bedtime.  03/25/19   [name], MD  tiZANidine (ZANAFLEX) 4 MG tablet Take 1 tablet at Bedtime as needed for Muscle Spasms 08/21/19   [name], MD  vitamin B-12 (CYANOCOBALAMIN) 1000 MCG tablet Take 1,000 mcg by mouth daily.    [provider]    Family History    Family History  Problem Relation Age of Onset  . Heart disease Mother   . Kidney disease Father   . AAA (abdominal aortic aneurysm) Father   . Heart disease Son   . Colon cancer Neg Hx   . Colon polyps Neg Hx   . Esophageal cancer Neg Hx   . Pancreatic cancer Neg Hx   . Stomach cancer Neg Hx   . Liver disease Neg Hx   . Diabetes Neg Hx    She indicated that her mother is deceased. She indicated that her father is deceased. She indicated that her brother is deceased. She indicated that her maternal grandmother is deceased. She indicated that her maternal grandfather is deceased. She indicated that her paternal grandmother is deceased. She indicated that her paternal grandfather is deceased. She indicated that the status of her son is unknown. She indicated that the status of her neg hx is unknown.  Social History    Social History   Socioeconomic History  . Marital status: Widowed    Spouse name: Not on file  . Number of children: Not on file  . Years of education: Not on file  . Highest  education level: Not on file  Occupational History  . Not on file  Tobacco Use  . Smoking status: Former Smoker    Packs/day: 2.50    Years: 25.00    Pack years: 62.50    Types: Cigarettes    Quit date: 11/11/1983    Years since quitting: 35.8  . Smokeless tobacco: Former Network engineer and Sexual Activity  . Alcohol use: No    Alcohol/week: 0.0 standard drinks  . Drug use: No  . Sexual activity: Never  Other Topics Concern  . Not on file  Social History Narrative  . Not on file   Social Determinants of Health   Financial Resource Strain:   . Difficulty of Paying Living Expenses:   Food Insecurity:   . Worried About Charity fundraiser in the Last Year:   . Arboriculturist in the Last Year:   Transportation Needs:   . Film/video editor (Medical):   Marland Kitchen Lack of Transportation (Non-Medical):   Physical Activity:   . Days of Exercise per Week:   . Minutes of Exercise per Session:   Stress:   . Feeling of Stress :   Social Connections:   . Frequency of Communication with Friends  and Family:   . Frequency of Social Gatherings with Friends and Family:   . Attends Religious Services:   . Active Member of Clubs or Organizations:   . Attends Archivist Meetings:   Marland Kitchen Marital Status:   Intimate Partner Violence:   . Fear of Current or Ex-Partner:   . Emotionally Abused:   Marland Kitchen Physically Abused:   . Sexually Abused:      Review of Systems    General:  No chills, fever, night sweats or weight changes.  Cardiovascular:  No chest pain, dyspnea on exertion, edema, orthopnea, palpitations, paroxysmal nocturnal dyspnea. Dermatological: No rash, lesions/masses Respiratory: No cough, dyspnea Urologic: No hematuria, dysuria Abdominal:   No nausea, vomiting, diarrhea, bright red blood per rectum, melena, or hematemesis Neurologic:  No visual changes, wkns, changes in mental status. All other systems reviewed and are otherwise negative except as noted above.  Physical  Exam    VS:  BP 138/72   Pulse 82   Ht 5\' 1"  (1.549 m)   Wt 238 lb (108 kg)   SpO2 96%   BMI 44.97 kg/m  , BMI Body mass index is 44.97 kg/m. GEN: Well nourished, well developed, in no acute distress. HEENT: normal. Neck: Supple, no JVD, carotid bruits, or masses. Cardiac: RRR, no murmurs, rubs, or gallops. No clubbing, cyanosis, edema.  Radials/DP/PT 2+ and equal bilaterally.  Respiratory:  Respirations regular and unlabored, clear to auscultation bilaterally. GI: Soft, nontender, nondistended, BS + x 4. MS: no deformity or atrophy. Skin: warm and dry, no rash. Neuro:  Strength and sensation are intact. Psych: Normal affect.  Accessory Clinical Findings    ECG personally reviewed by me today- none today.   Nuclear stress test 07/25/19: IMPRESSION: 1. Small focus of mild ischemia in the anterior septal wall. No additional evidence of reversible ischemia. No evidence of infarction.  2. Normal left ventricular wall motion.  3. Left ventricular ejection fraction 56%  4. Non invasive risk stratification*: Low to intermediate  Assessment & Plan   1.  Chest pressure/dyspnea on exertion-no chest pain today.  Cardiac catheterization with PCI to her LAD in 2017.  Admitted to the hospital 07/24/2019 and underwent nuclear stress test on 07/25/2019 which was negative for reversible ischemia.  Stress test results reviewed all questions answered, she expressed understanding. Continue metoprolol succinate 25 mg Continue aspirin Continue statin Continue ARB Heart healthy low-sodium diet-salty 6 given Increase physical activity as tolerated  Dyspnea on exertion-euvolemic today.  CXR during recent hospital admission showed no acute findings.  This was believed to be related to OHS.  She also indicated that she was under a lot of financial stress. Heart healthy low-sodium diet Increase physical activity as tolerated Follow-up with PCP  Essential hypertension-BP today 138/72 Continue  metoprolol succinate 25 mg Heart healthy low-sodium diet Increase physical activity as tolerated  Hyperlipidemia-07/25/2019: Cholesterol 127; HDL 34; LDL Cholesterol 42; Triglycerides 255; VLDL 51 Continue statin Heart healthy low-sodium high-fiber diet Increase physical activity as tolerated  Disposition: Follow-up with Dr. Oval Linsey in 6 months.  Jossie Ng. Thurman Sarver NP-C    09/03/2019, 3:55 PM [place] [address] Office [phone] Fax [phone]

## 2019-09-03 ENCOUNTER — Encounter: Payer: Self-pay | Admitting: General Practice

## 2019-09-03 ENCOUNTER — Other Ambulatory Visit: Payer: Self-pay

## 2019-09-03 ENCOUNTER — Ambulatory Visit (INDEPENDENT_AMBULATORY_CARE_PROVIDER_SITE_OTHER): Payer: PPO | Admitting: General Practice

## 2019-09-03 VITALS — BP 138/72 | HR 82 | Ht 61.0 in | Wt 238.0 lb

## 2019-09-03 DIAGNOSIS — R0602 Shortness of breath: Secondary | ICD-10-CM

## 2019-09-03 DIAGNOSIS — I10 Essential (primary) hypertension: Secondary | ICD-10-CM | POA: Diagnosis not present

## 2019-09-03 DIAGNOSIS — E782 Mixed hyperlipidemia: Secondary | ICD-10-CM | POA: Diagnosis not present

## 2019-09-03 DIAGNOSIS — R079 Chest pain, unspecified: Secondary | ICD-10-CM

## 2019-09-03 NOTE — Patient Instructions (Signed)
Medication Instructions:  The current medical regimen is effective;  continue present plan and medications as directed. Please refer to the Current Medication list given to you today.  *If you need a refill on your cardiac medications before your next appointment, please call your pharmacy*  Special Instructions Please follow up with your primary care to discuss lung related shortness of breath.  PLEASE READ AND FOLLOW SALTY 6-ATTACHED  Follow-Up: Your next appointment:  6 month(s) Please call our office 2 months in advance to schedule this appointment In Person with Skeet Latch, MD.  At [facility], you and your health needs are our priority.  As part of our continuing mission to provide you with exceptional heart care, we have created designated Provider Care Teams.  These Care Teams include your primary Cardiologist (physician) and Advanced Practice Providers (APPs -  Physician Assistants and Nurse Practitioners) who all work together to provide you with the care you need, when you need it.

## 2019-09-06 ENCOUNTER — Ambulatory Visit: Payer: PPO | Admitting: Physician Assistant

## 2019-09-06 NOTE — Progress Notes (Deleted)
MEDICARE ANNUAL WELLNESS VISIT AND FOLLOW UP  Assessment:   Essential hypertension - continue medications, DASH diet, exercise and monitor at home. Call if greater than 130/80.  -     CBC with Differential/Platelet -     BASIC METABOLIC PANEL WITH GFR -     Hepatic function panel -     TSH  NSTEMI (non-ST elevated myocardial infarction) (HCC) Control blood pressure, cholesterol, glucose, increase exercise.   Hypertensive heart disease without heart failure - continue medications, DASH diet, exercise and monitor at home. Call if greater than 130/80.  -     BASIC METABOLIC PANEL WITH GFR  CAD in native artery Control blood pressure, cholesterol, glucose, increase exercise.   OSA on CPAP Sleep apnea- continue CPAP, weight loss advised.   Controlled type 2 diabetes mellitus with stage 3 chronic kidney disease, without long-term current use of insulin ([place]) Discussed general issues about diabetes pathophysiology and management., Educational material distributed., Suggested low cholesterol diet., Encouraged aerobic exercise., Discussed foot care., Reminded to get yearly retinal exam. -     BASIC METABOLIC PANEL WITH GFR  Uncontrolled type 2 diabetes mellitus with stage 3 chronic kidney disease, with long-term current use of insulin ([place]) Discussed general issues about diabetes pathophysiology and management., Educational material distributed., Suggested low cholesterol diet., Encouraged aerobic exercise., Discussed foot care., Reminded to get yearly retinal exam. -     BASIC METABOLIC PANEL WITH GFR  CKD (chronic kidney disease), stage III Increase fluids, avoid NSAIDS, monitor sugars, will monitor - may need referral nephrology -     BASIC METABOLIC PANEL WITH GFR  Hyperlipidemia -continue medications, check lipids, decrease fatty foods, increase activity.  -     Lipid panel  Diverticulosis of large intestine without hemorrhage  History of colonic polyps  Morbid Obesity with  co morbidities - long discussion about weight loss, diet, and exercise  Vitamin D deficiency -     VITAMIN D 25 Hydroxy (Vit-D Deficiency, Fractures)  Medication management -     Magnesium  RLS (restless legs syndrome) Check iron  Medicare annual wellness visit, subsequent Declines MGM  Iron deficiency anemia, unspecified iron deficiency anemia type -     Iron and TIBC -     Ferritin  B12 Get on B12, may be causing some symptoms.   Depression Stop wellbutrin, ? If causing anxiety/symptoms, start on lexapro  Morbid obesity - follow up 3 months for progress monitoring - increase veggies, decrease carbs - long discussion about weight loss, diet, and exercise  Diabetic polyneuropathy associated with type 2 diabetes mellitus (HCC) -     Hemoglobin A1c Discussed disease progression and risks Discussed diet/exercise, weight management and risk modification  Type 2 diabetes mellitus with hyperlipidemia (HCC) -     Hemoglobin A1c check lipids decrease fatty foods increase activity.   Dry eye -     Sjogrens syndrome-A extractable nuclear antibody -     Sjogrens syndrome-B extractable nuclear antibody  Imbalance -     MR Brain W Wo Contrast; Future - get on B12 but with 3 episodes, 1 with confusion will get MRI to rule out TIA Close follow up 6 weeks She was informed to call 911 if she develop any new symptoms such as worsening headaches, episodes of blurred vision, double vision or complete loss of vision or speech difficulties or motor weakness.  Gout, unspecified cause, unspecified chronicity, unspecified site -     Uric acid - get back on allopurinol   Future  Appointments  Date Time Provider [place]  09/06/2019 10:00 AM Vicie Mutters, PA-C [facility] None  11/05/2019  2:30 PM [name], MD [facility] None  05/14/2020 11:00 AM [name], MD [facility] None     Plan:   During the course of the visit the patient was educated and counseled  about appropriate screening and preventive services including:    Pneumococcal vaccine   Influenza vaccine  Td vaccine  Screening electrocardiogram  Screening mammography  Bone densitometry screening  Colorectal cancer screening  Diabetes screening  Glaucoma screening  Nutrition counseling   Advanced directives: given info/requested  Subjective:   Janet Choi is a 79 y.o. female who presents for Medicare Annual Wellness Visit and 3 month follow up on hypertension,diabetes, hyperlipidemia, vitamin D def, poor compliance.   Brought her friend Tami with her, quit job at [employer] but to severe hand pain, had injections with Dr. Grandville Silos. Dr. Berenice Primas for her lower back pain left radicular pain and left arm, had ESI this past Thursday with Dr. Mina Marble, helping some. She is still driving some, not at night. Declines MGM, willing to get colonoscopy  She states she was had an episode in Oct of dizziness and not knowing where she was while driving, and had 2 falls since Jan due to imbalance. She was having double vision and dizziness, states saw eye doctor for dry eyes and started on drops that have helped. She has had neg ANA, AntiDNA, RF, RNP.   Her blood pressure has been controlled at home, she is on verapamil, lasix, today [name] is   She does not workout. She denies chest pain, shortness of breath, dizziness.   She had NSTEMI with PCI LAD and LCx 04/2016, on plavix follows Cardio, Dr. Oval Linsey, on ASA.   She is on cholesterol medication, lipitor and zetia denies myalgias. Her cholesterol is at goal. The cholesterol last visit was:   Lab Results  Component Value Date   CHOL 127 07/25/2019   HDL 34 (L) 07/25/2019   LDLCALC 42 07/25/2019   TRIG 255 (H) 07/25/2019   CHOLHDL 3.7 07/25/2019   She has been working on diet and exercise for diabetes, she is on bASA and on ACE/ARB, she is on basaglar 70 units at night, and novolog 16-20 units with meals. She is not checking her  sugars still. She will take her morning insulin but will forget to take her dinner time sugar, she does not eat lunch and does not take any insulin at lunch. She admits to not taking insulin as directed and not having a schedule for eating/sleeping.  She has one touch ultra. She is following Dr. 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 Last A1C in the office was:  Lab Results  Component Value Date   HGBA1C 9.5 (H) 07/25/2019   Lab Results  Component Value Date   CREATININE 1.34 (H) 07/30/2019   BUN 25 07/30/2019   NA 142 07/30/2019   K 4.5 07/30/2019   CL 105 07/30/2019   CO2 28 07/30/2019   Last GFR, she does not see nephrology.  Lab Results  Component Value Date   GFRNONAA 38 (L) 07/30/2019   Patient is on Vitamin D supplement. Lab Results  Component Value Date   VD25OH 51 04/30/2019   BMI is There is no height or weight on file to calculate BMI., she is working on diet and  exercise and has done well. She states she just lost her best friend of 75 years, her sister (87 years old ) recently diagnosed with dementia. She has been depressed and worried about her memory. She states he is hungry all the time.  Wt Readings from Last 3 Encounters:  09/03/19 238 lb (108 kg)  07/30/19 250 lb (113.4 kg)  07/24/19 245 lb (111.1 kg)    Names of Other Physician/Practitioners you currently use: 1. [facility]- here for primary care 2. Dr. Satira Sark, eye doctor, last visit 05/2018 3. Dr. Luretha Rued, dentist, q 6 months 4. Dr. Lorin Mercy, spinal stenosis.  Patient Care Team: [name], MD as PCP - General (Internal Medicine) Skeet Latch, MD as PCP - Cardiology (Cardiology) Marygrace Drought, MD as Consulting Physician (Ophthalmology) Inda Castle, MD (Inactive) as Consulting Physician (Gastroenterology) Larey Dresser, MD as Consulting Physician  (Cardiology) Nobie Putnam, MD as Consulting Physician (Oncology) Tania Ade, MD as Consulting Physician (Orthopedic Surgery)  Medication Review  Current Outpatient Medications (Endocrine & Metabolic):  .  insulin aspart (NOVOLOG FLEXPEN) 100 UNIT/ML FlexPen, Inject 50  to 75 units 3 x /day before meals as directed for Diabetes (Patient taking differently: Inject 20 Units into the skin 3 (three) times daily with meals. Inject 50  to 75 units 3 x /day before meals as directed for Diabetes) .  Insulin Glargine (BASAGLAR KWIKPEN) 100 UNIT/ML SOPN, Inject 70 units Daily for Diabetes (Patient taking differently: Inject 70 Units into the skin at bedtime. Inject 70 units Daily for Diabetes) .  liraglutide (VICTOZA) 18 MG/3ML SOPN, Inject 0.2 mLs (1.2 mg total) into the skin every morning. (Patient not taking: Reported on 09/03/2019)  Current Outpatient Medications (Cardiovascular):  .  atorvastatin (LIPITOR) 80 MG tablet, TAKE 1 TABLET (80 MG TOTAL) BY MOUTH DAILY AT 6 PM. (Patient taking differently: Take 80 mg by mouth every evening. ) .  furosemide (LASIX) 40 MG tablet, Take 40 mg by mouth as needed for fluid. Marland Kitchen  losartan (COZAAR) 50 MG tablet, Take 1 tablet Daily for BP & Diabetic Kidney Protection .  metoprolol succinate (TOPROL-XL) 25 MG 24 hr tablet, Take 1 tablet (25 mg total) by mouth at bedtime. .  nitroGLYCERIN (NITROSTAT) 0.4 MG SL tablet, Place 1 tablet (0.4 mg total) under the tongue every 5 (five) minutes x 3 doses as needed for chest pain.   Current Outpatient Medications (Analgesics):  .  acetaminophen (TYLENOL) 325 MG tablet, Take 2 tablets (650 mg total) by mouth every 4 (four) hours as needed for headache or mild pain. Marland Kitchen  allopurinol (ZYLOPRIM) 100 MG tablet, TAKE 1 TABLET DAILY TO PREVENT GOUT (Patient taking differently: Take 100 mg by mouth daily. ) .  aspirin EC 81 MG EC tablet, Take 1 tablet (81 mg total) by mouth daily. .  colchicine 0.6 MG tablet, Take 2 tablets first day  of gout flare, then one a day until gout flare is better (Patient not taking: Reported on 07/24/2019)  Current Outpatient Medications (Hematological):  .  ferrous sulfate 325 (65 FE) MG tablet, Take 325 mg by mouth daily with breakfast. .  vitamin B-12 (CYANOCOBALAMIN) 1000 MCG tablet, Take 1,000 mcg by mouth daily.  Current Outpatient Medications (Other):  Marland Kitchen  buPROPion (WELLBUTRIN XL) 300 MG 24 hr tablet, Take 1 tablet (300 mg total) by mouth daily. Take 1 tablet Daily for Mood .  Cholecalciferol ([employer] VIT D 5000 HIGH-POTENCY) 5000 units capsule, Take 5,000 Units by mouth 2 (two)  times daily.  .  Continuous Blood Gluc Receiver (FREESTYLE LIBRE 14 DAY READER) DEVI, 1 Device by Does not apply route every 14 (fourteen) days. .  Continuous Blood Gluc Sensor (FREESTYLE LIBRE 14 DAY SENSOR) MISC, Apply 1 application topically every 14 (fourteen) days. .  diclofenac sodium (VOLTAREN) 1 % GEL, Apply 4 g topically 4 (four) times daily. (Patient taking differently: Apply 4 g topically daily as needed (For had pain). ) .  escitalopram (LEXAPRO) 20 MG tablet, Take 1 tablet (20 mg total) by mouth daily. (Patient taking differently: Take 30 mg by mouth daily. ) .  gabapentin (NEURONTIN) 100 MG capsule, Take 1 to 2 capsules 3 to 4 x /day as needed for Painful Diabetic Neuropathy (Patient taking differently: Take 200 mg by mouth in the morning, at noon, in the evening, and at bedtime. ) .  Insulin Pen Needle (BD PEN NEEDLE NANO U/F) 32G X 4 MM MISC, USE AS DIRECTED 4 TIMES A DAY .  ketoconazole (NIZORAL) 2 % cream, Apply 1 application topically 2 (two) times daily. .  Lancets (ONETOUCH ULTRASOFT) lancets, Use as instructed .  ONETOUCH VERIO test strip, USE AS INSTRUCTED TO CHECK SUGAR 2 TIMES DAILY. .  Polyethylene Glycol 400 (BLINK TEARS OP), Place 1 drop into both eyes daily as needed. Marland Kitchen  rOPINIRole (REQUIP) 3 MG tablet, Take 1 tablet 4 x /day for Restless Legs (Patient taking differently: Take 3 mg by mouth in  the morning, at noon, in the evening, and at bedtime. ) .  tiZANidine (ZANAFLEX) 4 MG tablet, Take 1 tablet at Bedtime as needed for Muscle Spasms  Current Problems (verified) Patient Active Problem List   Diagnosis Date Noted  . Solitary pulmonary nodule 08/29/2019  . Chronic bronchitis ([place]) 08/29/2019  . 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 ([place]) 08/07/2019  . Uncontrolled type 2 diabetes mellitus with stage 3 chronic kidney disease, with long-term current use of insulin ([place]) 08/07/2019  . Chronic diastolic heart failure ([place]) 07/30/2019  . Superior mesenteric artery stenosis ([place]) 07/30/2019  . Chest pain with moderate risk for cardiac etiology 07/24/2019  . DJD (degenerative joint disease) of knee 09/11/2018  . Diabetic polyneuropathy associated with type 2 diabetes mellitus ([place]) 06/27/2018  . Upper airway cough syndrome 01/24/2018  . DJD (degenerative joint disease), lumbosacral 06/02/2017  . DOE (dyspnea on exertion) 04/19/2016  . Anxiety 04/19/2016  . CAD S/P percutaneous coronary angioplasty   . History of non-ST elevation myocardial infarction (NSTEMI) 04/14/2016  . CKD stage 3 due to type 2 diabetes mellitus ([place]) 04/14/2016  . Iron deficiency anemia 08/15/2015  . OSA on CPAP 04/16/2015  . RLS (restless legs syndrome) 03/20/2014  . Type 2 diabetes mellitus with hyperlipidemia ([place]) 03/20/2014  . Medication management 09/12/2013  . Morbid obesity due to excess calories ([place]) 05/14/2013  . Vitamin D deficiency 05/14/2013  . Hyperlipidemia associated with type 2 diabetes mellitus ([place]) 02/24/2009  . Essential hypertension 02/24/2009  . Diverticulosis of large intestine 02/24/2009  . History of colonic polyps 02/24/2009    Screening Tests Immunization History  Administered Date(s) Administered  . DT (Pediatric) 04/14/2015  . Hepatitis B 07/09/1999, 08/09/1999, 01/09/2000  . Influenza, High Dose Seasonal PF 03/20/2014, 02/14/2017, 04/12/2018,  02/22/2019  . Influenza,inj,quad, With Preservative 04/26/2016  . Influenza-Unspecified 03/28/2015, 04/14/2015, 02/14/2017  . Pneumococcal Conjugate-13 03/20/2014  . Pneumococcal Polysaccharide-23 10/15/2009  . Td 03/19/2004    Preventative care: Last colonoscopy: 2010 with Dr. Deatra Ina Last mammogram: 2012 OVERDUE BUT DECLINES Last  pap smear/pelvic exam: remote [id number] [place] AB 2007 Echo 2017 EF [id number], diastolic grade 1 dysfunction. Cath 04/2016 EF 40-50% PFT 01/2018  EYE exam Jan 2020 [facility]  Prior vaccinations: TD or Tdap: 2016  Influenza: 2018 Pneumococcal: 2011  Prevnar 13 2015 Shingles/Zostavax: declines due to cost  History reviewed: allergies, current medications, past family history, past medical history, past social history, past surgical history and problem list  Allergies Allergies  Allergen Reactions  . Brilinta [Ticagrelor] Shortness Of Breath and Other (See Comments)    Chest pain (also)  . Aspirin Other (See Comments)    Can tolerate in small doses (is already on a blood thinner AND has kidney disease)  . Nsaids Other (See Comments)    Patient is taking a blood thinner and has kidney disease  . Minocycline Hcl Other (See Comments)    Welts  . Oruvail [Ketoprofen] Other (See Comments)    Has kidney disease and is taking a blood thinner  . Tricor [Fenofibrate]     Reaction unknown  . Vasotec [Enalaprilat]     Reaction unknown  . Zinc     Reaction unknown    SURGICAL HISTORY She  has a past surgical history that includes Total knee arthroplasty (Bilateral); Lumbar disc surgery (X 2); Appendectomy; Joint replacement; Back surgery; Melanoma excision (Left); Excision basal cell carcinoma (Left); Shoulder open rotator cuff repair (Right); Shoulder arthroscopy w/ rotator cuff repair (Left); Cataract extraction, bilateral (Bilateral); Cardiac catheterization (N/A, 04/15/2016); and Cardiac catheterization (N/A, 04/15/2016). FAMILY HISTORY Her family history  includes AAA (abdominal aortic aneurysm) in her father; Heart disease in her mother and son; Kidney disease in her father. SOCIAL HISTORY She  reports that she quit smoking about 35 years ago. Her smoking use included cigarettes. She has a 62.50 pack-year smoking history. She has quit using smokeless tobacco. She reports that she does not drink alcohol or use drugs.  MEDICARE WELLNESS OBJECTIVES: Physical activity:   Cardiac risk factors:   Depression/mood screen:   Depression screen [facility] 2/9 04/29/2019  Decreased Interest 0  Down, Depressed, Hopeless 0  PHQ - 2 Score 0  Some recent data might be hidden    ADLs:  In your present state of health, do you have any difficulty performing the following activities: 04/29/2019  Hearing? N  Vision? N  Difficulty concentrating or making decisions? N  Walking or climbing stairs? N  Dressing or bathing? N  Doing errands, shopping? N  Some recent data might be hidden     Cognitive Testing  Alert? Yes  Normal Appearance?Yes  Oriented to person? Yes  Place? Yes   Time? Yes  Recall of three objects?  Yes  Can perform simple calculations? Yes  Displays appropriate judgment?Yes  Can read the correct time from a watch face?Yes  EOL planning:     Objective:   There were no vitals taken for this visit. There is no height or weight on file to calculate BMI.  General appearance: alert, no distress, WD/WN,  female HEENT: normocephalic, sclerae anicteric, TMs pearly, nares patent, no discharge or erythema, pharynx normal Oral cavity: MMM, no lesions Neck: supple, no lymphadenopathy, no thyromegaly, no masses Heart: RRR, normal S1, S2, no murmurs Lungs: CTA bilaterally, no wheezes, rhonchi, or rales Abdomen: +bs, soft, obese non tender, non distended, no masses, no hepatomegaly, no splenomegaly Musculoskeletal: nontender, no swelling, no obvious deformity Extremities: 1-2+ edema, no cyanosis, no clubbing Pulses: 2+ symmetric, upper and lower  extremities, normal cap refill Neurological: alert, oriented  x 3, CN2-12 intact, strength normal upper extremities and lower extremities, sensation decreased bilateral legs up to mid shin, DTRs 2+ throughout, no cerebellar signs, gait wide and antalgic Psychiatric: normal affect, behavior normal, pleasant  Breast: defer Gyn: defer Rectal: defer  Medicare Attestation I have personally reviewed: The patient's medical and social history Their use of alcohol, tobacco or illicit drugs Their current medications and supplements The patient's functional ability including ADLs,fall risks, home safety risks, cognitive, and hearing and visual impairment Diet and physical activities Evidence for depression or mood disorders  The patient's weight, height, BMI, and visual acuity have been recorded in the chart.  I have made referrals, counseling, and provided education to the patient based on review of the above and I have provided the patient with a written personalized care plan for preventive services.     Vicie Mutters, PA-C   09/06/2019

## 2019-09-07 ENCOUNTER — Ambulatory Visit (INDEPENDENT_AMBULATORY_CARE_PROVIDER_SITE_OTHER): Payer: PPO | Admitting: Physician Assistant

## 2019-09-07 ENCOUNTER — Other Ambulatory Visit: Payer: Self-pay

## 2019-09-07 ENCOUNTER — Encounter: Payer: Self-pay | Admitting: Physician Assistant

## 2019-09-07 VITALS — BP 120/68 | HR 72 | Temp 97.3°F | Ht 61.0 in | Wt 240.0 lb

## 2019-09-07 DIAGNOSIS — K551 Chronic vascular disorders of intestine: Secondary | ICD-10-CM | POA: Diagnosis not present

## 2019-09-07 DIAGNOSIS — I5032 Chronic diastolic (congestive) heart failure: Secondary | ICD-10-CM

## 2019-09-07 DIAGNOSIS — G4733 Obstructive sleep apnea (adult) (pediatric): Secondary | ICD-10-CM | POA: Diagnosis not present

## 2019-09-07 DIAGNOSIS — I10 Essential (primary) hypertension: Secondary | ICD-10-CM | POA: Diagnosis not present

## 2019-09-07 DIAGNOSIS — N183 Chronic kidney disease, stage 3 unspecified: Secondary | ICD-10-CM

## 2019-09-07 DIAGNOSIS — Z0000 Encounter for general adult medical examination without abnormal findings: Secondary | ICD-10-CM

## 2019-09-07 DIAGNOSIS — R911 Solitary pulmonary nodule: Secondary | ICD-10-CM

## 2019-09-07 DIAGNOSIS — R6889 Other general symptoms and signs: Secondary | ICD-10-CM | POA: Diagnosis not present

## 2019-09-07 DIAGNOSIS — D509 Iron deficiency anemia, unspecified: Secondary | ICD-10-CM

## 2019-09-07 DIAGNOSIS — R197 Diarrhea, unspecified: Secondary | ICD-10-CM

## 2019-09-07 DIAGNOSIS — I252 Old myocardial infarction: Secondary | ICD-10-CM

## 2019-09-07 DIAGNOSIS — F3341 Major depressive disorder, recurrent, in partial remission: Secondary | ICD-10-CM

## 2019-09-07 DIAGNOSIS — E1159 Type 2 diabetes mellitus with other circulatory complications: Secondary | ICD-10-CM | POA: Diagnosis not present

## 2019-09-07 DIAGNOSIS — Z79899 Other long term (current) drug therapy: Secondary | ICD-10-CM

## 2019-09-07 DIAGNOSIS — F419 Anxiety disorder, unspecified: Secondary | ICD-10-CM

## 2019-09-07 DIAGNOSIS — J42 Unspecified chronic bronchitis: Secondary | ICD-10-CM | POA: Diagnosis not present

## 2019-09-07 DIAGNOSIS — E559 Vitamin D deficiency, unspecified: Secondary | ICD-10-CM

## 2019-09-07 DIAGNOSIS — J441 Chronic obstructive pulmonary disease with (acute) exacerbation: Secondary | ICD-10-CM

## 2019-09-07 DIAGNOSIS — I2510 Atherosclerotic heart disease of native coronary artery without angina pectoris: Secondary | ICD-10-CM

## 2019-09-07 DIAGNOSIS — E785 Hyperlipidemia, unspecified: Secondary | ICD-10-CM

## 2019-09-07 DIAGNOSIS — Z794 Long term (current) use of insulin: Secondary | ICD-10-CM | POA: Diagnosis not present

## 2019-09-07 DIAGNOSIS — E1169 Type 2 diabetes mellitus with other specified complication: Secondary | ICD-10-CM

## 2019-09-07 DIAGNOSIS — Z0001 Encounter for general adult medical examination with abnormal findings: Secondary | ICD-10-CM

## 2019-09-07 DIAGNOSIS — IMO0002 Reserved for concepts with insufficient information to code with codable children: Secondary | ICD-10-CM

## 2019-09-07 DIAGNOSIS — K5730 Diverticulosis of large intestine without perforation or abscess without bleeding: Secondary | ICD-10-CM

## 2019-09-07 DIAGNOSIS — M47817 Spondylosis without myelopathy or radiculopathy, lumbosacral region: Secondary | ICD-10-CM

## 2019-09-07 DIAGNOSIS — E1122 Type 2 diabetes mellitus with diabetic chronic kidney disease: Secondary | ICD-10-CM | POA: Diagnosis not present

## 2019-09-07 DIAGNOSIS — E1142 Type 2 diabetes mellitus with diabetic polyneuropathy: Secondary | ICD-10-CM | POA: Diagnosis not present

## 2019-09-07 DIAGNOSIS — J449 Chronic obstructive pulmonary disease, unspecified: Secondary | ICD-10-CM

## 2019-09-07 DIAGNOSIS — E1165 Type 2 diabetes mellitus with hyperglycemia: Secondary | ICD-10-CM

## 2019-09-07 MED ORDER — FREESTYLE LIBRE 14 DAY READER DEVI: 1.0000 | 4 refills | Status: DC

## 2019-09-07 MED ORDER — ALBUTEROL SULFATE HFA 108 (90 BASE) MCG/ACT IN AERS: 2.0000 | INHALATION_SPRAY | RESPIRATORY_TRACT | 3 refills | Status: AC | PRN

## 2019-09-07 MED ORDER — BENZONATATE 200 MG PO CAPS: 200.0000 mg | ORAL_CAPSULE | Freq: Three times a day (TID) | ORAL | 0 refills | Status: DC | PRN

## 2019-09-07 MED ORDER — PREDNISONE 20 MG PO TABS: ORAL_TABLET | ORAL | 0 refills | Status: DC

## 2019-09-07 MED ORDER — BUPROPION HCL ER (XL) 150 MG PO TB24: 150.0000 mg | ORAL_TABLET | Freq: Every day | ORAL | 0 refills | Status: DC

## 2019-09-07 MED ORDER — AZITHROMYCIN 250 MG PO TABS: ORAL_TABLET | ORAL | 1 refills | Status: AC

## 2019-09-07 MED ORDER — FREESTYLE LIBRE 14 DAY SENSOR MISC: 1.0000 "application " | 4 refills | Status: DC

## 2019-09-07 NOTE — Patient Instructions (Addendum)
Please use the zpak, prednisone, and tessalon for your cough Monitor your sugars for the prednisone use Take with food  For the diarrhea, cut the wellbutrin in half, only take 150mg  a day, can continue the lexapro.   Get COVID test  Can use toliet paper roll as spacer between your inhaler and your mouth.  Shake inhaler, prime it if first use.    Chronic Obstructive Pulmonary Disease Exacerbation  Chronic obstructive pulmonary disease (COPD) is a long-term (chronic) condition that affects the lungs. COPD is a general term that can be used to describe many different lung problems that cause lung swelling (inflammation) and limit airflow, including chronic bronchitis and emphysema. COPD exacerbations are episodes when breathing symptoms become much worse and require extra treatment. COPD exacerbations are usually caused by infections. Without treatment, COPD exacerbations can be severe and even life threatening. Frequent COPD exacerbations can cause further damage to the lungs. What are the causes? This condition may be caused by:  Respiratory infections, including viral and bacterial infections.  Exposure to smoke.  Exposure to air pollution, chemical fumes, or dust.  Things that give you an allergic reaction (allergens).  Not taking your usual COPD medicines as directed.  Underlying medical problems, such as congestive heart failure or infections not involving the lungs. In many cases, the cause (trigger) of this condition is not known. What increases the risk? The following factors may make you more likely to develop this condition:  Smoking cigarettes.  Old age.  Frequent prior COPD exacerbations. What are the signs or symptoms? Symptoms of this condition include:  Increased coughing.  Increased production of mucus from your lungs (sputum).  Increased wheezing.  Increased shortness of breath.  Rapid or labored breathing.  Chest tightness.  Less energy than  usual.  Sleep disruption from symptoms.  Confusion or increased sleepiness. Often these symptoms happen or get worse even with the use of medicines. How is this diagnosed? This condition is diagnosed based on:  Your medical history.  A physical exam. You may also have tests, including:  A chest X-ray.  Blood tests.  Lung (pulmonary) function tests. How is this treated? Treatment for this condition depends on the severity and cause of the symptoms. You may need to be admitted to a hospital for treatment. Some of the treatments commonly used to treat COPD exacerbations are:  Antibiotic medicines. These may be used for severe exacerbations caused by a lung infection, such as pneumonia.  Bronchodilators. These are inhaled medicines that expand the air passages and allow increased airflow.  Steroid medicines. These act to reduce inflammation in the airways. They may be given with an inhaler, taken by mouth, or given through an IV tube inserted into one of your veins.  Supplemental oxygen therapy.  Airway clearing techniques, such as noninvasive ventilation (NIV) and positive expiratory pressure (PEP). These provide respiratory support through a mask or other noninvasive device. An example of this would be using a continuous positive airway pressure (CPAP) machine to improve delivery of oxygen into your lungs. Follow these instructions at home: Medicines  Take over-the-counter and prescription medicines only as told by your health care provider. It is important to use correct technique with inhaled medicines.  If you were prescribed an antibiotic medicine or oral steroid, take it as told by your health care provider. Do not stop taking the medicine even if you start to feel better. Lifestyle  Eat a healthy diet.  Exercise regularly.  Get plenty of sleep.  Avoid  exposure to all substances that irritate the airway, especially to tobacco smoke.  Wash your hands often with soap  and water to reduce the risk of infection. If soap and water are not available, use hand sanitizer.  During flu season, avoid enclosed spaces that are crowded with people. General instructions  Drink enough fluid to keep your urine clear or pale yellow (unless you have a medical condition that requires fluid restriction).  Use a cool mist vaporizer. This humidifies the air and makes it easier for you to clear your chest when you cough.  If you have a home nebulizer and oxygen, continue to use them as told by your health care provider.  Keep all follow-up visits as told by your health care provider. This is important. How is this prevented?  Stay up-to-date on pneumococcal and influenza (flu) vaccines. A flu shot is recommended every year to help prevent exacerbations.  Do not use any products that contain nicotine or tobacco, such as cigarettes and e-cigarettes. Quitting smoking is very important in preventing COPD from getting worse and in preventing exacerbations from happening as often. If you need help quitting, ask your health care provider.  Follow all instructions for pulmonary rehabilitation after a recent exacerbation. This can help prevent future exacerbations.  Work with your health care provider to develop and follow an action plan. This tells you what steps to take when you experience certain symptoms. Contact a health care provider if:  You have a worsening of your regular COPD symptoms. Get help right away if:  You have worsening shortness of breath, even when resting.  You have trouble talking.  You have severe chest pain.  You cough up blood.  You have a fever.  You have weakness, vomit repeatedly, or faint.  You feel confused.  You are not able to sleep because of your symptoms.  You have trouble doing daily activities. Summary  COPD exacerbations are episodes when breathing symptoms become much worse and require extra treatment above your normal  treatment.  Exacerbations can be severe and even life threatening. Frequent COPD exacerbations can cause further damage to your lungs.  COPD exacerbations are usually triggered by infections such as the flu, colds, and even pneumonia.  Treatment for this condition depends on the severity and cause of the symptoms. You may need to be admitted to a hospital for treatment.  Quitting smoking is very important to prevent COPD from getting worse and to prevent exacerbations from happening as often. This information is not intended to replace advice given to you by your health care provider. Make sure you discuss any questions you have with your health care provider. Document Revised: 04/08/2017 Document Reviewed: 05/31/2016 [employer] Patient Education  2020 [employer].

## 2019-09-07 NOTE — Progress Notes (Signed)
MEDICARE ANNUAL WELLNESS VISIT AND FOLLOW UP  Assessment:    Encounter for Medicare annual wellness exam 1 year Need MGM and colonoscopy- patient declines  COPD exacerbation ([place]) -     albuterol (VENTOLIN HFA) 108 (90 Base) MCG/ACT inhaler; Inhale 2 puffs into the lungs every 4 (four) hours as needed for wheezing or shortness of breath. -     benzonatate (TESSALON) 200 MG capsule; Take 1 capsule (200 mg total) by mouth 3 (three) times daily as needed for cough (Max: 600mg  per day). -     predniSONE (DELTASONE) 20 MG tablet; 2 tablets daily for 3 days, 1 tablet daily for 4 days. -     azithromycin (ZITHROMAX) 250 MG tablet; Take 2 tablets (500 mg) on  Day 1,  followed by 1 tablet (250 mg) once daily on Days 2 through 5. -     Ambulatory referral to Pulmonology  Chronic bronchitis, unspecified chronic bronchitis type (HCC) -     albuterol (VENTOLIN HFA) 108 (90 Base) MCG/ACT inhaler; Inhale 2 puffs into the lungs every 4 (four) hours as needed for wheezing or shortness of breath. 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   Uncontrolled type 2 diabetes mellitus with stage 3 chronic kidney disease, with long-term current use of insulin (HCC) -     Continuous Blood Gluc Sensor (FREESTYLE LIBRE 14 DAY SENSOR) MISC; Apply 1 application topically every 14 (fourteen) days. -     Continuous Blood Gluc Receiver (FREESTYLE LIBRE 14 DAY READER) DEVI; 1 Device by Does not apply route every 14 (fourteen) days.  Chronic diastolic heart failure (HCC) CHF Emphasized salt restriction, less than 2000mg  a day. Encouraged daily monitoring of the patient's weight Encouraged regular exercise. If any increasing shortness of breath, swelling, or chest pressure go to ER immediately.    Superior mesenteric artery stenosis  (HCC) Monitor  Diabetic polyneuropathy associated with type 2 diabetes mellitus (HCC) -     Continuous Blood Gluc Sensor (FREESTYLE LIBRE 14 DAY SENSOR) MISC; Apply 1 application topically every 14 (fourteen) days. -     Continuous Blood Gluc Receiver (FREESTYLE LIBRE 14 DAY READER) DEVI; 1 Device by Does not apply route every 14 (fourteen) days.  CKD stage 3 due to type 2 diabetes mellitus (HCC) avoid NSAIDS, monitor sugars, will monitor- off metformin  Type 2 diabetes mellitus with hyperlipidemia ([place]) Discussed general issues about diabetes pathophysiology and management., Educational material distributed., Suggested low cholesterol diet., Encouraged aerobic exercise., Discussed foot care., Reminded to get yearly retinal exam. No labs, checked recently  Long term (current) use of insulin ([place]) -     Continuous Blood Gluc Sensor (FREESTYLE LIBRE 14 DAY SENSOR) MISC; Apply 1 application topically every 14 (fourteen) days. -     Continuous Blood Gluc Receiver (FREESTYLE LIBRE 14 DAY READER) DEVI; 1 Device by Does not apply route every 14 (fourteen) days.  OSA and COPD overlap syndrome ([place]) NOT WEARING CPAP AT THIS TIME Counseled that she needs to wear it to help breathing  Morbid obesity due to excess calories ([place]) - follow up 3 months for progress monitoring - increase veggies, decrease carbs - long discussion about weight loss, diet, and exercise  Hyperlipidemia associated with type 2 diabetes mellitus (HCC) check lipids decrease fatty foods increase activity.   Solitary pulmonary nodule NEEDS FOLLOW IN OCT  Essential  hypertension - continue medications, DASH diet, exercise and monitor at home. Call if greater than 130/80.   Vitamin D deficiency Continue supplement  Iron deficiency anemia, unspecified iron deficiency anemia type Monitor  Medication management Monitor  Diverticulosis of large intestine without hemorrhage Monitor  History of non-ST elevation  myocardial infarction (NSTEMI) Recent cardio follow up and recent normal stress test  Anxiety Cut back on wellbutrin, continue lexapro  DJD (degenerative joint disease), lumbosacral Continue follow up, weight loss  Diarrhea, unspecified type -     Ambulatory referral to Gastroenterology Cut back on wellbutrin to 150, possible too high a dose with wellbutrin and lexapro Don't take any colchicine Keep food diarrhea So sporadic- will not do stool studies at this time but may want to do fecal fat with long standing diabetes Will refer to GI- she is overdue for colonoscopy- patient may not need it due to risk factors and age but she needs a discussion Please go to the ER if you have any severe AB pain, unable to hold down food/water, blood in stool or vomit, chest pain, shortness of breath, or any worsening symptoms.   Depression, major, recurrent, in partial remission (HCC) -     buPROPion (WELLBUTRIN XL) 150 MG 24 hr tablet; Take 1 tablet (150 mg total) by mouth daily. Take 1 tablet Daily for Mood     Future Appointments  Date Time Provider [place]  11/05/2019  2:30 PM [name], MD [facility] None  05/14/2020 11:00 AM [name], MD [facility] None     Plan:   During the course of the visit the patient was educated and counseled about appropriate screening and preventive services including:    Pneumococcal vaccine   Influenza vaccine  Td vaccine  Screening electrocardiogram  Screening mammography  Bone densitometry screening  Colorectal cancer screening  Diabetes screening  Glaucoma screening  Nutrition counseling   Advanced directives: given info/requested  Subjective:   Janet Choi is a 79 y.o. female who presents for Medicare Annual Wellness Visit and 3 month follow up on hypertension,diabetes, hyperlipidemia, vitamin D def, poor compliance.   She has not been feeling well since this past Sunday, she did go visit her nephew kids  that were sick, since that time she has had a severe cough, states her ribs hurt due to coughing so much. She has been doing the breztri once a day sometimes 2 (she forgets)  but it is not helping, she does not have inhaler.   She has been  Having off and on diarrhea x 1 year, no warning until she gets some AB cramping, sometimes she will have fecal incontinence due to this, will happen once every few months, not associated with any food, will be pure water, will have dark stool with it in small pieces. Will take imodium that can help some, most of the time it "runs its course" after a few hours. No nausea/vomiting with it. She still has gallbaldder She is not on metformin, she is on lexapro 20 and wellbutrin 300.  She is only on colchicine as needed and very rare.   Follows with Dr. Grandville Silos and Dr. Berenice Primas  And Dr. Mina Marble, for DJD/DDD, has had ESI in the past. She has had neg ANA, AntiDNA, RF, RNP  She is still driving some, not at night. Tami and scott live with her and her son lives in the backyard, has a lot of support.   Her blood pressure has been controlled at home, she is  on verapamil, lasix, today [name] is BP: 120/68 She does not workout. She denies chest pain, shortness of breath, dizziness.   She had NSTEMI with PCI LAD and LCx 04/2016, on plavix follows Cardio, Dr. Oval Linsey, on ASA.  She had hospital visit 03/16-03/17 for DOE, had pancytopenia at that time, had normal myoview 07/25/19. Had CT chest 08/28/19 showing nodule, needs repeat 6 months and showed COPD. Will refer to pulmonary for DOE.  She had normal MRI brain 07/2018 and normal VQ scan 01/2018.   She is on cholesterol medication, lipitor and zetia denies myalgias. Her cholesterol is at goal. The cholesterol last visit was:   Lab Results  Component Value Date   CHOL 127 07/25/2019   HDL 34 (L) 07/25/2019   LDLCALC 42 07/25/2019   TRIG 255 (H) 07/25/2019   CHOLHDL 3.7 07/25/2019   She has been working on diet and  exercise for diabetes,  With CAD she is on bASA  With CKD on ACE/ARB- she is NOT on MF- following Dr. Marval Regal. With neuropathy On insulin long term Has seen Dr. Darnell Level in the past.  she is on basaglar 70 units at night, and novolog 16-20 units with meals.   She is not checking her sugars still. She will take her morning insulin but will forget to take her dinner time sugar, she does not eat lunch and does not take any insulin at lunch. She admits to not taking insulin as directed and not having a schedule for eating/sleeping.   She has one touch ultra she is on insulin and would like the continous monitor.  denies hypoglycemia , polydipsia, polyuria and visual disturbances.  Last A1C in the office was:  Lab Results  Component Value Date   HGBA1C 9.5 (H) 07/25/2019   Lab Results  Component Value Date   CREATININE 1.34 (H) 07/30/2019   BUN 25 07/30/2019   NA 142 07/30/2019   K 4.5 07/30/2019   CL 105 07/30/2019   CO2 28 07/30/2019   Last GFR, she does not see nephrology.  Lab Results  Component Value Date   GFRNONAA 38 (L) 07/30/2019   Patient is on Vitamin D supplement. Lab Results  Component Value Date   VD25OH 51 04/30/2019   BMI is Body mass index is 45.35 kg/m., she is working on diet and exercise, just started and trying to get her weight down. .  Wt Readings from Last 3 Encounters:  09/07/19 240 lb (108.9 kg)  09/03/19 238 lb (108 kg)  07/30/19 250 lb (113.4 kg)    Names of Other Physician/Practitioners you currently use: 1. [facility]- here for primary care 2. Dr. Satira Sark, eye doctor, last visit 05/2018 3. Dr. Luretha Rued, dentist, q 6 months May 6th 4. Dr. Lorin Mercy, spinal stenosis.  Patient Care Team: [name], MD as PCP - General (Internal Medicine) Skeet Latch, MD as PCP - Cardiology (Cardiology) Marygrace Drought, MD as Consulting Physician (Ophthalmology) Inda Castle, MD (Inactive) as Consulting Physician  (Gastroenterology) Larey Dresser, MD as Consulting Physician (Cardiology) Nobie Putnam, MD as Consulting Physician (Oncology) Tania Ade, MD as Consulting Physician (Orthopedic Surgery)  Medication Review  Current Outpatient Medications (Endocrine & Metabolic):  .  insulin aspart (NOVOLOG FLEXPEN) 100 UNIT/ML FlexPen, Inject 50  to 75 units 3 x /day before meals as directed for Diabetes (Patient taking differently: Inject 20 Units into the skin 3 (three) times daily with meals. Inject 50  to 75 units 3 x /day  before meals as directed for Diabetes) .  Insulin Glargine (BASAGLAR KWIKPEN) 100 UNIT/ML SOPN, Inject 70 units Daily for Diabetes (Patient taking differently: Inject 70 Units into the skin at bedtime. Inject 70 units Daily for Diabetes) .  liraglutide (VICTOZA) 18 MG/3ML SOPN, Inject 0.2 mLs (1.2 mg total) into the skin every morning. (Patient not taking: Reported on 09/07/2019) .  predniSONE (DELTASONE) 20 MG tablet, 2 tablets daily for 3 days, 1 tablet daily for 4 days.  Current Outpatient Medications (Cardiovascular):  .  atorvastatin (LIPITOR) 80 MG tablet, TAKE 1 TABLET (80 MG TOTAL) BY MOUTH DAILY AT 6 PM. (Patient taking differently: Take 80 mg by mouth every evening. ) .  furosemide (LASIX) 40 MG tablet, Take 40 mg by mouth as needed for fluid. Marland Kitchen  losartan (COZAAR) 50 MG tablet, Take 1 tablet Daily for BP & Diabetic Kidney Protection .  metoprolol succinate (TOPROL-XL) 25 MG 24 hr tablet, Take 1 tablet (25 mg total) by mouth at bedtime. .  nitroGLYCERIN (NITROSTAT) 0.4 MG SL tablet, Place 1 tablet (0.4 mg total) under the tongue every 5 (five) minutes x 3 doses as needed for chest pain.  Current Outpatient Medications (Respiratory):  .  albuterol (VENTOLIN HFA) 108 (90 Base) MCG/ACT inhaler, Inhale 2 puffs into the lungs every 4 (four) hours as needed for wheezing or shortness of breath. .  benzonatate (TESSALON) 200 MG capsule, Take 1 capsule (200 mg total) by mouth 3  (three) times daily as needed for cough (Max: 600mg  per day).  Current Outpatient Medications (Analgesics):  .  acetaminophen (TYLENOL) 325 MG tablet, Take 2 tablets (650 mg total) by mouth every 4 (four) hours as needed for headache or mild pain. Marland Kitchen  allopurinol (ZYLOPRIM) 100 MG tablet, TAKE 1 TABLET DAILY TO PREVENT GOUT (Patient taking differently: Take 100 mg by mouth daily. ) .  aspirin EC 81 MG EC tablet, Take 1 tablet (81 mg total) by mouth daily.  Current Outpatient Medications (Hematological):  .  ferrous sulfate 325 (65 FE) MG tablet, Take 325 mg by mouth daily with breakfast. .  vitamin B-12 (CYANOCOBALAMIN) 1000 MCG tablet, Take 1,000 mcg by mouth daily.  Current Outpatient Medications (Other):  Marland Kitchen  buPROPion (WELLBUTRIN XL) 150 MG 24 hr tablet, Take 1 tablet (150 mg total) by mouth daily. Take 1 tablet Daily for Mood .  Cholecalciferol ([employer] VIT D 5000 HIGH-POTENCY) 5000 units capsule, Take 5,000 Units by mouth 2 (two) times daily.  .  Continuous Blood Gluc Receiver (FREESTYLE LIBRE 14 DAY READER) DEVI, 1 Device by Does not apply route every 14 (fourteen) days. .  Continuous Blood Gluc Sensor (FREESTYLE LIBRE 14 DAY SENSOR) MISC, Apply 1 application topically every 14 (fourteen) days. .  diclofenac sodium (VOLTAREN) 1 % GEL, Apply 4 g topically 4 (four) times daily. (Patient taking differently: Apply 4 g topically daily as needed (For had pain). ) .  escitalopram (LEXAPRO) 20 MG tablet, Take 1 tablet (20 mg total) by mouth daily. (Patient taking differently: Take 30 mg by mouth daily. ) .  gabapentin (NEURONTIN) 100 MG capsule, Take 1 to 2 capsules 3 to 4 x /day as needed for Painful Diabetic Neuropathy (Patient taking differently: Take 200 mg by mouth in the morning, at noon, in the evening, and at bedtime. ) .  Insulin Pen Needle (BD PEN NEEDLE NANO U/F) 32G X 4 MM MISC, USE AS DIRECTED 4 TIMES A DAY .  ketoconazole (NIZORAL) 2 % cream, Apply 1 application topically 2 (two)  times  daily. .  Lancets (ONETOUCH ULTRASOFT) lancets, Use as instructed .  ONETOUCH VERIO test strip, USE AS INSTRUCTED TO CHECK SUGAR 2 TIMES DAILY. .  Polyethylene Glycol 400 (BLINK TEARS OP), Place 1 drop into both eyes daily as needed. Marland Kitchen  rOPINIRole (REQUIP) 3 MG tablet, Take 1 tablet 4 x /day for Restless Legs (Patient taking differently: Take 3 mg by mouth in the morning, at noon, in the evening, and at bedtime. ) .  tiZANidine (ZANAFLEX) 4 MG tablet, Take 1 tablet at Bedtime as needed for Muscle Spasms .  azithromycin (ZITHROMAX) 250 MG tablet, Take 2 tablets (500 mg) on  Day 1,  followed by 1 tablet (250 mg) once daily on Days 2 through 5.  Current Problems (verified) Patient Active Problem List   Diagnosis Date Noted  . Long term (current) use of insulin ([place]) 09/07/2019  . Solitary pulmonary nodule 08/29/2019  . Chronic bronchitis ([place]) 08/29/2019  . 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 Uncontrolled type 2 diabetes mellitus with stage 3 chronic kidney disease, with long-term current use of insulin ([place]) 08/07/2019  . Chronic diastolic heart failure ([place]) 07/30/2019  . Superior mesenteric artery stenosis ([place]) 07/30/2019  . Chest pain with moderate risk for cardiac etiology 07/24/2019  . DJD (degenerative joint disease) of knee 09/11/2018  . Diabetic polyneuropathy associated with type 2 diabetes mellitus ([place]) 06/27/2018  . Upper airway cough syndrome 01/24/2018  . DJD (degenerative joint disease), lumbosacral 06/02/2017  . DOE (dyspnea on exertion) 04/19/2016  . Anxiety 04/19/2016  . CAD S/P percutaneous coronary angioplasty   . History of non-ST elevation myocardial infarction (NSTEMI) 04/14/2016  . CKD stage 3 due to type 2 diabetes mellitus ([place]) 04/14/2016  . Iron deficiency anemia 08/15/2015  . OSA and COPD overlap syndrome ([place]) 04/16/2015  . RLS (restless legs syndrome) 03/20/2014  . Type 2 diabetes mellitus with hyperlipidemia ([place])  03/20/2014  . Medication management 09/12/2013  . Morbid obesity due to excess calories ([place]) 05/14/2013  . Vitamin D deficiency 05/14/2013  . Hyperlipidemia associated with type 2 diabetes mellitus ([place]) 02/24/2009  . Essential hypertension 02/24/2009  . Diverticulosis of large intestine 02/24/2009  . History of colonic polyps 02/24/2009    Screening Tests Immunization History  Administered Date(s) Administered  . DT (Pediatric) 04/14/2015  . Hepatitis B 07/09/1999, 08/09/1999, 01/09/2000  . Influenza, High Dose Seasonal PF 03/20/2014, 02/14/2017, 04/12/2018, 02/22/2019  . Influenza,inj,quad, With Preservative 04/26/2016  . Influenza-Unspecified 03/28/2015, 04/14/2015, 02/14/2017  . Pneumococcal Conjugate-13 03/20/2014  . Pneumococcal Polysaccharide-23 10/15/2009  . Td 03/19/2004    Preventative care: Last colonoscopy: 2010 with Dr. Deatra Ina OVER DUE Last mammogram: 2012 OVERDUE BUT DECLINES Last pap smear/pelvic exam: remote [id number] [place] AB 2007 Echo 2017 EF [id number], diastolic grade 1 dysfunction. Cath 04/2016 EF 40-50% PFT 01/2018  EYE exam Jan 2020 [facility]  Prior vaccinations: TD or Tdap: 2016  Influenza: 2018 Pneumococcal: 2011  Prevnar 13 2015 Shingles/Zostavax: declines due to cost  History reviewed: allergies, current medications, past family history, past medical history, past social history, past surgical history and problem list  Allergies Allergies  Allergen Reactions  . Brilinta [Ticagrelor] Shortness Of Breath and Other (See Comments)    Chest pain (also)  . Aspirin Other (See Comments)    Can tolerate in small doses (is already on a blood thinner AND has kidney disease)  . Nsaids Other (See Comments)    Patient is taking a blood thinner and has  kidney disease  . Minocycline Hcl Other (See Comments)    Welts  . Oruvail [Ketoprofen] Other (See Comments)    Has kidney disease and is taking a blood thinner  . Tricor [Fenofibrate]     Reaction unknown   . Vasotec [Enalaprilat]     Reaction unknown  . Zinc     Reaction unknown    SURGICAL HISTORY She  has a past surgical history that includes Total knee arthroplasty (Bilateral); Lumbar disc surgery (X 2); Appendectomy; Joint replacement; Back surgery; Melanoma excision (Left); Excision basal cell carcinoma (Left); Shoulder open rotator cuff repair (Right); Shoulder arthroscopy w/ rotator cuff repair (Left); Cataract extraction, bilateral (Bilateral); Cardiac catheterization (N/A, 04/15/2016); and Cardiac catheterization (N/A, 04/15/2016). FAMILY HISTORY Her family history includes AAA (abdominal aortic aneurysm) in her father; Heart disease in her mother and son; Kidney disease in her father. SOCIAL HISTORY She  reports that she quit smoking about 35 years ago. Her smoking use included cigarettes. She has a 62.50 pack-year smoking history. She has quit using smokeless tobacco. She reports that she does not drink alcohol or use drugs.  MEDICARE WELLNESS OBJECTIVES: Physical activity: Current Exercise Habits: The patient does not participate in regular exercise at present, Exercise limited by: orthopedic condition(s);respiratory conditions(s) Cardiac risk factors: Cardiac Risk Factors include: advanced age (>86men, >64 women);diabetes mellitus;dyslipidemia;hypertension;family history of premature cardiovascular disease;obesity (BMI >30kg/m2);sedentary lifestyle Depression/mood screen:   Depression screen [facility] 2/9 09/07/2019  Decreased Interest 0  Down, Depressed, Hopeless 0  PHQ - 2 Score 0  Altered sleeping 0  Tired, decreased energy 0  Change in appetite 0  Feeling bad or failure about yourself  0  Trouble concentrating 0  Moving slowly or fidgety/restless 0  Suicidal thoughts 0  PHQ-9 Score 0  Difficult doing work/chores Not difficult at all  Some recent data might be hidden    ADLs:  In your present state of health, do you have any difficulty performing the following activities:  09/07/2019 04/29/2019  Hearing? N N  Vision? N N  Difficulty concentrating or making decisions? N N  Walking or climbing stairs? Y N  Dressing or bathing? N N  Doing errands, shopping? N N  Some recent data might be hidden     Cognitive Testing  Alert? Yes  Normal Appearance?Yes  Oriented to person? Yes  Place? Yes   Time? Yes  Recall of three objects?  Yes  Can perform simple calculations? Yes  Displays appropriate judgment?Yes  Can read the correct time from a watch face?Yes  EOL planning: Does Patient Have a Medical Advance Directive?: Yes Type of Advance Directive: Healthcare Power of Attorney, Living will Does patient want to make changes to medical advance directive?: No - Patient declined Copy of [place] in Chart?: No - copy requested   Objective:   Blood pressure 120/68, pulse 72, temperature (!) 97.3 F (36.3 C), height 5\' 1"  (1.549 m), weight 240 lb (108.9 kg), SpO2 93 %. Body mass index is 45.35 kg/m.  General appearance: alert, no distress, WD/WN,  female HEENT: normocephalic, sclerae anicteric, TMs pearly, nares patent, no discharge or erythema, pharynx normal Oral cavity: MMM, no lesions Neck: supple, no lymphadenopathy, no thyromegaly, no masses Heart: RRR, normal S1, S2, no murmurs, decreased heart sounds due to body habitus Lungs: CTA bilaterally, + wheezes, no rhonchi, or rales Abdomen: +bs, soft, obese, difficult exam due to body habitus, non tender, non distended, no masses, no hepatomegaly, no splenomegaly Musculoskeletal: nontender, no swelling, no obvious  deformity Extremities: 1-2+ edema, no cyanosis, no clubbing Pulses: 2+ symmetric, upper and lower extremities, normal cap refill Neurological: alert, oriented x 3, CN2-12 intact, strength normal upper extremities and lower extremities, sensation decreased bilateral legs up to mid shin, DTRs 2+ throughout, no cerebellar signs, gait wide and antalgic Psychiatric: normal affect,  behavior normal, pleasant  Breast: defer Gyn: defer Rectal: defer  Medicare Attestation I have personally reviewed: The patient's medical and social history Their use of alcohol, tobacco or illicit drugs Their current medications and supplements The patient's functional ability including ADLs,fall risks, home safety risks, cognitive, and hearing and visual impairment Diet and physical activities Evidence for depression or mood disorders  The patient's weight, height, BMI, and visual acuity have been recorded in the chart.  I have made referrals, counseling, and provided education to the patient based on review of the above and I have provided the patient with a written personalized care plan for preventive services.     Vicie Mutters, PA-C   09/07/2019

## 2019-10-01 NOTE — Progress Notes (Signed)
Subjective:    Patient ID: Janet Choi, female    DOB: Sep 18, 1940, 79 y.o.   MRN: [id number]  HPI 79 y.o. morbidly obese female with history of NSTEMI2017with PCI of the LAD and LCx, hypertensive heart disease, OSA, DM, HTN, HLD, morbid obesity, IBS, GERD,and CKDIII. She has been having off and on diarrhea x 1 year, no warning until she gets some AB cramping, sometimes she will have fecal incontinence due to this, will happen once every few months, not associated with any food, will be pure water, will have dark stool with it in small pieces.  She is having black stool, no iron or pepto.   Will take imodium that can help some, most of the time it "runs its course" after a few hours. No nausea/vomiting with it. She still has gallbladder. She has been to th ER with chest pain, normal heart evaluation, she is seeing pulmonary but has worsening sOB and chest heaviness. Has been beltching more.  She is not on NSAIDS, no ETOH. She stopped her victoza for a few days, but still had diarrhea.  She is not on metformin, she was cut down to 150mg  from 300mg  of her wellbutrin last visit and is still on her lexapro 20mg - which did not help She is only on colchicine as needed and very rare.  She is overdue for colonoscopy- last one 2010 AB [place] 2007  Blood pressure 136/80, pulse 76, temperature (!) 97.3 F (36.3 C), SpO2 94 %.  Medications  Current Outpatient Medications (Endocrine & Metabolic):  .  insulin aspart (NOVOLOG FLEXPEN) 100 UNIT/ML FlexPen, Inject 50  to 75 units 3 x /day before meals as directed for Diabetes (Patient taking differently: Inject 20 Units into the skin 3 (three) times daily with meals. Inject 50  to 75 units 3 x /day before meals as directed for Diabetes) .  Insulin Glargine (BASAGLAR KWIKPEN) 100 UNIT/ML SOPN, Inject 70 units Daily for Diabetes (Patient taking differently: Inject 70 Units into the skin at bedtime. Inject 70 units Daily for Diabetes) .  liraglutide  (VICTOZA) 18 MG/3ML SOPN, Inject 0.2 mLs (1.2 mg total) into the skin every morning.  Current Outpatient Medications (Cardiovascular):  .  atorvastatin (LIPITOR) 80 MG tablet, TAKE 1 TABLET (80 MG TOTAL) BY MOUTH DAILY AT 6 PM. (Patient taking differently: Take 80 mg by mouth every evening. ) .  furosemide (LASIX) 40 MG tablet, Take 40 mg by mouth as needed for fluid. Marland Kitchen  losartan (COZAAR) 50 MG tablet, Take 1 tablet Daily for BP & Diabetic Kidney Protection .  metoprolol succinate (TOPROL-XL) 25 MG 24 hr tablet, Take 1 tablet (25 mg total) by mouth at bedtime. .  nitroGLYCERIN (NITROSTAT) 0.4 MG SL tablet, Place 1 tablet (0.4 mg total) under the tongue every 5 (five) minutes x 3 doses as needed for chest pain.  Current Outpatient Medications (Respiratory):  .  albuterol (VENTOLIN HFA) 108 (90 Base) MCG/ACT inhaler, Inhale 2 puffs into the lungs every 4 (four) hours as needed for wheezing or shortness of breath. .  benzonatate (TESSALON) 200 MG capsule, Take 1 capsule (200 mg total) by mouth 3 (three) times daily as needed for cough (Max: 600mg  per day).  Current Outpatient Medications (Analgesics):  .  acetaminophen (TYLENOL) 325 MG tablet, Take 2 tablets (650 mg total) by mouth every 4 (four) hours as needed for headache or mild pain. Marland Kitchen  allopurinol (ZYLOPRIM) 100 MG tablet, TAKE 1 TABLET DAILY TO PREVENT GOUT (Patient taking differently: Take  100 mg by mouth daily. ) .  aspirin EC 81 MG EC tablet, Take 1 tablet (81 mg total) by mouth daily.  Current Outpatient Medications (Hematological):  .  ferrous sulfate 325 (65 FE) MG tablet, Take 325 mg by mouth daily with breakfast. .  vitamin B-12 (CYANOCOBALAMIN) 1000 MCG tablet, Take 1,000 mcg by mouth daily.  Current Outpatient Medications (Other):  Marland Kitchen  buPROPion (WELLBUTRIN XL) 300 MG 24 hr tablet, Take 1 tablet (300 mg total) by mouth daily. Take 1 tablet Daily for Mood .  Cholecalciferol ([employer] VIT D 5000 HIGH-POTENCY) 5000 units capsule, Take  5,000 Units by mouth 2 (two) times daily.  .  Continuous Blood Gluc Receiver (FREESTYLE LIBRE 14 DAY READER) DEVI, 1 Device by Does not apply route every 14 (fourteen) days. .  Continuous Blood Gluc Sensor (FREESTYLE LIBRE 14 DAY SENSOR) MISC, Apply 1 application topically every 14 (fourteen) days. .  diclofenac sodium (VOLTAREN) 1 % GEL, Apply 4 g topically 4 (four) times daily. (Patient taking differently: Apply 4 g topically daily as needed (For had pain). ) .  escitalopram (LEXAPRO) 20 MG tablet, Take 1 tablet (20 mg total) by mouth daily. (Patient taking differently: Take 30 mg by mouth daily. ) .  gabapentin (NEURONTIN) 100 MG capsule, Take 1 to 2 capsules 3 to 4 x /day as needed for Painful Diabetic Neuropathy (Patient taking differently: Take 200 mg by mouth in the morning, at noon, in the evening, and at bedtime. ) .  Insulin Pen Needle (BD PEN NEEDLE NANO U/F) 32G X 4 MM MISC, USE AS DIRECTED 4 TIMES A DAY .  ketoconazole (NIZORAL) 2 % cream, Apply 1 application topically 2 (two) times daily. .  Lancets (ONETOUCH ULTRASOFT) lancets, Use as instructed .  ONETOUCH VERIO test strip, USE AS INSTRUCTED TO CHECK SUGAR 2 TIMES DAILY. .  Polyethylene Glycol 400 (BLINK TEARS OP), Place 1 drop into both eyes daily as needed. Marland Kitchen  rOPINIRole (REQUIP) 3 MG tablet, Take 1 tablet 4 x /day for Restless Legs (Patient taking differently: Take 3 mg by mouth in the morning, at noon, in the evening, and at bedtime. ) .  tiZANidine (ZANAFLEX) 4 MG tablet, Take 1 tablet at Bedtime as needed for Muscle Spasms .  pantoprazole (PROTONIX) 40 MG tablet, Take 1 tablet (40 mg total) by mouth daily.  Problem list She has Hyperlipidemia associated with type 2 diabetes mellitus ([place]); Essential hypertension; Diverticulosis of large intestine; History of colonic polyps; Morbid obesity due to excess calories ([place]); Vitamin D deficiency; Medication management; RLS (restless legs syndrome); Type 2 diabetes mellitus with  hyperlipidemia (HCC); OSA and COPD overlap syndrome ([place]); Iron deficiency anemia; History of non-ST elevation myocardial infarction (NSTEMI); CKD stage 3 due to type 2 diabetes mellitus ([place]); CAD S/P percutaneous coronary angioplasty; DOE (dyspnea on exertion); Anxiety; DJD (degenerative joint disease), lumbosacral; Upper airway cough syndrome; Diabetic polyneuropathy associated with type 2 diabetes mellitus ([place]); DJD (degenerative joint disease) of knee; Chest pain with moderate risk for cardiac etiology; Chronic diastolic heart failure ([place]); Superior mesenteric artery stenosis ([place]); 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  Review of Systems See HPI    Objective:   Physical Exam eneral appearance: alert, no distress, WD/WN,  female HEENT: normocephalic, sclerae anicteric, TMs  pearly, nares patent, no discharge or erythema, pharynx normal Oral cavity: MMM, no lesions Neck: supple, no lymphadenopathy, no thyromegaly, no masses Heart: RRR, normal S1, S2, no murmurs, decreased heart sounds due to body habitus Lungs: CTA bilaterally, no wheezes, no rhonchi, or rales Abdomen: +bs, soft, obese, difficult exam due to body habitus, + diffuse tenderness pain epigastric and RUQ tenderness, no rebound, non distended, no masses, no hepatomegaly, no splenomegaly Musculoskeletal: nontender, no swelling, no obvious deformity Extremities: 1-2+ edema, no cyanosis, no clubbing Pulses: 2+ symmetric, upper and lower extremities, normal cap refill Neurological: alert, oriented x 3, CN2-12 intact, strength normal upper extremities and lower extremities, sensation decreased bilateral legs up to mid shin, DTRs 2+ throughout, no cerebellar signs, gait wide and  antalgic Psychiatric: normal affect, behavior normal, pleasant        Assessment & Plan:  Diarrhea, unspecified type/AB pain/GERD -     Ambulatory referral to Gastroenterology Can go back up to wellbutrin 300, did not help diarrhea and made more depressed,  Don't take any colchicine Keep food diarrhea Will get AB [place] for gallbladder/pancrease and check pancreatic labs, may want/need fecal fat ? GERD causing some SOB/CP will check for H pylori may need EGD- treat with protonix  Will refer to GI- she is overdue for colonoscopy- patient may not need it due to risk factors and age but she needs a discussion  Please go to the ER if you have any severe AB pain, unable to hold down food/water, blood in stool or vomit, chest pain, shortness of breath, or any worsening symptoms.

## 2019-10-02 ENCOUNTER — Other Ambulatory Visit: Payer: Self-pay

## 2019-10-02 ENCOUNTER — Encounter: Payer: Self-pay | Admitting: Physician Assistant

## 2019-10-02 ENCOUNTER — Ambulatory Visit (INDEPENDENT_AMBULATORY_CARE_PROVIDER_SITE_OTHER): Payer: PPO | Admitting: Physician Assistant

## 2019-10-02 DIAGNOSIS — R109 Unspecified abdominal pain: Secondary | ICD-10-CM

## 2019-10-02 DIAGNOSIS — R197 Diarrhea, unspecified: Secondary | ICD-10-CM | POA: Diagnosis not present

## 2019-10-02 DIAGNOSIS — F3341 Major depressive disorder, recurrent, in partial remission: Secondary | ICD-10-CM

## 2019-10-02 DIAGNOSIS — K2100 Gastro-esophageal reflux disease with esophagitis, without bleeding: Secondary | ICD-10-CM

## 2019-10-02 DIAGNOSIS — K8010 Calculus of gallbladder with chronic cholecystitis without obstruction: Secondary | ICD-10-CM

## 2019-10-02 MED ORDER — PANTOPRAZOLE SODIUM 40 MG PO TBEC: 40.0000 mg | DELAYED_RELEASE_TABLET | Freq: Every day | ORAL | 1 refills | Status: DC

## 2019-10-02 MED ORDER — BUPROPION HCL ER (XL) 300 MG PO TB24: 300.0000 mg | ORAL_TABLET | Freq: Every day | ORAL | 1 refills | Status: DC

## 2019-10-02 NOTE — Patient Instructions (Addendum)
Will check for H pylori Will check for ulcer by sending you to GI, ruling out infection, and treating you with protonix, this may be contributing to your shortness of breath.   Will get AB [place] of your gallbladder and pancrease.  Will get labs to look for anemia/infection and check your pancrease.   Please go to the ER if you have any severe AB pain, unable to hold down food/water, blood in stool or vomit, chest pain, shortness of breath, or any worsening symptoms.   YOU CAN CALL TO MAKE AN ULTRASOUND..  I have put in an order for an ultrasound for you to have You can set them up at your convenience by calling this number [phone] You will likely have the ultrasound at [place] [address]  If you have any issues call our office and we will set this up for you.    Silent reflux: Not all heartburn burns...Marland KitchenMarland KitchenMarland Kitchen  What is LPR? Laryngopharyngeal reflux (LPR) or silent reflux is a condition in which acid that is made in the stomach travels up the esophagus (swallowing tube) and gets to the throat. Not everyone with reflux has a lot of heartburn or indigestion. In fact, many people with LPR never have heartburn. This is why LPR is called SILENT REFLUX, and the terms "Silent reflux" and "LPR" are often used interchangeably. Because LPR is silent, it is sometimes difficult to diagnose.  How can you tell if you have LPR?  Marland Kitchen Chronic hoarseness- Some people have hoarseness that comes and goes . throat clearing  . Cough . It can cause shortness of breath and cause asthma like symptoms. Marland Kitchen a feeling of a lump in the throat  . difficulty swallowing . a problem with too much nose and throat drainage.  . Some people will feel their esophagus spasm which feels like their heart beating hard and fast, this will usually be after a meal, at rest, or lying down at night.    How do I treat this? Treatment for LPR should be individualized, and your doctor will suggest the best treatment for you.  Generally there are several treatments for LPR: . changing habits and diet to reduce reflux,  . medications to reduce stomach acid, and  . surgery to prevent reflux. Most people with LPR need to modify how and when they eat, as well as take some medication, to get well. Sometimes, nonprescription liquid antacids, such as Maalox, Gelucil and Mylanta are recommended. When used, these antacids should be taken four times each day - one tablespoon one hour after each meal and before bedtime. Dietary and lifestyle changes alone are not often enough to control LPR - medications that reduce stomach acid are also usually needed. These must be prescribed by our doctor.   TIPS FOR REDUCING REFLUX AND LPR Control your LIFE-STYLE and your DIET! Marland Kitchen If you use tobacco, QUIT.  Marland Kitchen Smoking makes you reflux. After every cigarette you have some LPR.  . Don't wear clothing that is too tight, especially around the waist (trousers, corsets, belts).  . Do not lie down just after eating...in fact, do not eat within three hours of bedtime.  . You should be on a low-fat diet.  . Limit your intake of red meat.  . Limit your intake of butter.  Marland Kitchen Avoid fried foods.  . Avoid chocolate  . Avoid cheese.  Marland Kitchen Avoid eggs. Marland Kitchen Specifically avoid caffeine (especially coffee and tea), soda pop (especially cola) and mints.  Marland Kitchen  Avoid alcoholic beverages, particularly in the evening.

## 2019-10-03 LAB — CBC WITH DIFFERENTIAL/PLATELET
Absolute Monocytes: 73 cells/uL — ABNORMAL LOW (ref 200–950)
Basophils Absolute: 11 cells/uL (ref 0–200)
Basophils Relative: 0.5 %
Eosinophils Absolute: 92 cells/uL (ref 15–500)
Eosinophils Relative: 4.2 %
HCT: 32.6 % — ABNORMAL LOW (ref 35.0–45.0)
Hemoglobin: 10.4 g/dL — ABNORMAL LOW (ref 11.7–15.5)
Lymphs Abs: 972 cells/uL (ref 850–3900)
MCH: 32 pg (ref 27.0–33.0)
MCHC: 31.9 g/dL — ABNORMAL LOW (ref 32.0–36.0)
MCV: 100.3 fL — ABNORMAL HIGH (ref 80.0–100.0)
MPV: 10.8 fL (ref 7.5–12.5)
Monocytes Relative: 3.3 %
Neutro Abs: 1052 cells/uL — ABNORMAL LOW (ref 1500–7800)
Neutrophils Relative %: 47.8 %
Platelets: 120 10*3/uL — ABNORMAL LOW (ref 140–400)
RBC: 3.25 10*6/uL — ABNORMAL LOW (ref 3.80–5.10)
RDW: 13.3 % (ref 11.0–15.0)
Total Lymphocyte: 44.2 %
WBC: 2.2 10*3/uL — ABNORMAL LOW (ref 3.8–10.8)

## 2019-10-03 LAB — COMPLETE METABOLIC PANEL WITH GFR
AG Ratio: 2.2 (calc) (ref 1.0–2.5)
ALT: 9 U/L (ref 6–29)
AST: 8 U/L — ABNORMAL LOW (ref 10–35)
Albumin: 4 g/dL (ref 3.6–5.1)
Alkaline phosphatase (APISO): 82 U/L (ref 37–153)
BUN/Creatinine Ratio: 19 (calc) (ref 6–22)
BUN: 24 mg/dL (ref 7–25)
CO2: 29 mmol/L (ref 20–32)
Calcium: 9.1 mg/dL (ref 8.6–10.4)
Chloride: 105 mmol/L (ref 98–110)
Creat: 1.27 mg/dL — ABNORMAL HIGH (ref 0.60–0.93)
GFR, Est African American: 47 mL/min/{1.73_m2} — ABNORMAL LOW (ref >=60)
GFR, Est Non African American: 40 mL/min/{1.73_m2} — ABNORMAL LOW (ref >=60)
Globulin: 1.8 g/dL (calc) — ABNORMAL LOW (ref 1.9–3.7)
Glucose, Bld: 291 mg/dL — ABNORMAL HIGH (ref 65–99)
Potassium: 4.7 mmol/L (ref 3.5–5.3)
Sodium: 141 mmol/L (ref 135–146)
Total Bilirubin: 0.5 mg/dL (ref 0.2–1.2)
Total Protein: 5.8 g/dL — ABNORMAL LOW (ref 6.1–8.1)

## 2019-10-03 LAB — AMYLASE: Amylase: 34 U/L (ref 21–101)

## 2019-10-03 LAB — LIPASE: Lipase: 37 U/L (ref 7–60)

## 2019-10-03 LAB — H. PYLORI BREATH TEST: H. pylori Breath Test: NOT DETECTED

## 2019-10-03 NOTE — Progress Notes (Addendum)
ADDENDUM  Patient with drop in H/H from 10.4 on 05/25 to now 8.8 with pancytopenia.  SPEP with possible very small band, waiting for further interpretation.  Will stop allopurinol-? contributing.  Will send in STAT referral to GI.  ER precautions discussed with patient and her sister.   She is very tried, unchanged SOB.   If she has any black stool, AB pain, SOB, CP, weakness, will go to ER.   CBC Latest Ref Rng & Units 10/11/2019 10/09/2019 10/02/2019  WBC 3.8 - 10.8 Thousand/uL 2.0(L) 2.1(L) 2.2(L)  Hemoglobin 11.7 - 15.5 g/dL 8.8(L) 9.2(L) 10.4(L)  Hematocrit 35.0 - 45.0 % 27.0(L) 28.1(L) 32.6(L)  Platelets 140 - 400 Thousand/uL 113(L) 120(L) 120(L)       Subjective:    Patient ID: Janet Choi, female    DOB: [birth year], 79 y.o.   MRN: [id number]  HPI 79 y.o. morbidly obese female with history of NSTEMI2017with PCI of the LAD and LCx, hypertensive heart disease, OSA, DM, HTN, HLD, morbid obesity, IBS, GERD,and CKDIII presents for follow up for abnormal labs.  She was complaining of intermittent long standing diarrhea, new black stools has not happened again but she states she had been taking pepto intermittently, and worsening SOB/chest heaviness has been evaluated by cardio, getting evaluated by pulmonary. Treated last visit for possible LPR, ulcer, GERD started on protonix, suppose to get AB [place] and referred to GI.  She states since being on protonix her cough has improved but she is still SOB. She is starting to have epigastric pain with food which is new. She continue to have diarrhea.   She had a normal pancrease, negative H Pylori.  She had a drop in her H/H and new pancytopenia and albumineria. She had a normal SPEP in 2015.  She states that she does not have night sweats, no weight loss, no bone pain. She does have easy bruising.    She is overdue for colonoscopy- last one 2010 AB [place] 2007  Janet Choi is her sister, on HIPPA  She has the freestyle libre but does not have it  hooked up yet.  Lab Results  Component Value Date   HGBA1C 9.5 (H) 07/25/2019   Future Appointments  Date Time Provider [place]  10/15/2019 10:15 AM Collene Gobble, MD [facility] None  11/05/2019  2:30 PM [name], MD [facility] None  05/14/2020 11:00 AM [name], MD [facility] None  09/16/2020 11:15 AM Vicie Mutters, PA-C [facility] None    Blood pressure 124/68, pulse 72, temperature (!) 97.3 F (36.3 C), weight 244 lb (110.7 kg), SpO2 94 %.  Medications  Current Outpatient Medications (Endocrine & Metabolic):  .  insulin aspart (NOVOLOG FLEXPEN) 100 UNIT/ML FlexPen, Inject 50  to 75 units 3 x /day before meals as directed for Diabetes (Patient taking differently: Inject 20 Units into the skin 3 (three) times daily with meals. Inject 50  to 75 units 3 x /day before meals as directed for Diabetes) .  Insulin Glargine (BASAGLAR KWIKPEN) 100 UNIT/ML SOPN, Inject 70 units Daily for Diabetes (Patient taking differently: Inject 70 Units into the skin at bedtime. Inject 70 units Daily for Diabetes) .  liraglutide (VICTOZA) 18 MG/3ML SOPN, Inject 0.2 mLs (1.2 mg total) into the skin every morning.  Current Outpatient Medications (Cardiovascular):  .  atorvastatin (LIPITOR) 80 MG tablet, TAKE 1 TABLET (80 MG TOTAL) BY MOUTH DAILY AT 6 PM. (Patient taking differently: Take 80 mg by mouth every evening. ) .  furosemide (LASIX) 40  MG tablet, Take 40 mg by mouth as needed for fluid. Marland Kitchen  losartan (COZAAR) 50 MG tablet, Take 1 tablet Daily for BP & Diabetic Kidney Protection .  metoprolol succinate (TOPROL-XL) 25 MG 24 hr tablet, Take 1 tablet (25 mg total) by mouth at bedtime. .  nitroGLYCERIN (NITROSTAT) 0.4 MG SL tablet, Place 1 tablet (0.4 mg total) under the tongue every 5 (five) minutes x 3 doses as needed for chest pain.  Current Outpatient Medications (Respiratory):  .  albuterol (VENTOLIN HFA) 108 (90 Base) MCG/ACT inhaler, Inhale 2 puffs into the lungs every 4  (four) hours as needed for wheezing or shortness of breath. .  benzonatate (TESSALON) 200 MG capsule, Take 1 capsule (200 mg total) by mouth 3 (three) times daily as needed for cough (Max: 600mg  per day).  Current Outpatient Medications (Analgesics):  .  acetaminophen (TYLENOL) 325 MG tablet, Take 2 tablets (650 mg total) by mouth every 4 (four) hours as needed for headache or mild pain. Marland Kitchen  allopurinol (ZYLOPRIM) 100 MG tablet, TAKE 1 TABLET DAILY TO PREVENT GOUT (Patient taking differently: Take 100 mg by mouth daily. ) .  aspirin EC 81 MG EC tablet, Take 1 tablet (81 mg total) by mouth daily.  Current Outpatient Medications (Hematological):  .  ferrous sulfate 325 (65 FE) MG tablet, Take 325 mg by mouth daily with breakfast. .  vitamin B-12 (CYANOCOBALAMIN) 1000 MCG tablet, Take 1,000 mcg by mouth daily.  Current Outpatient Medications (Other):  Marland Kitchen  buPROPion (WELLBUTRIN XL) 300 MG 24 hr tablet, Take 1 tablet (300 mg total) by mouth daily. Take 1 tablet Daily for Mood .  Cholecalciferol ([employer]S VIT D 5000 HIGH-POTENCY) 5000 units capsule, Take 5,000 Units by mouth 2 (two) times daily.  .  Continuous Blood Gluc Receiver (FREESTYLE LIBRE 14 DAY READER) DEVI, 1 Device by Does not apply route every 14 (fourteen) days. .  Continuous Blood Gluc Sensor (FREESTYLE LIBRE 14 DAY SENSOR) MISC, Apply 1 application topically every 14 (fourteen) days. .  diclofenac sodium (VOLTAREN) 1 % GEL, Apply 4 g topically 4 (four) times daily. (Patient taking differently: Apply 4 g topically daily as needed (For had pain). ) .  escitalopram (LEXAPRO) 20 MG tablet, Take 1 tablet (20 mg total) by mouth daily. (Patient taking differently: Take 30 mg by mouth daily. ) .  gabapentin (NEURONTIN) 100 MG capsule, Take 1 to 2 capsules 3 to 4 x /day as needed for Painful Diabetic Neuropathy (Patient taking differently: Take 200 mg by mouth in the morning, at noon, in the evening, and at bedtime. ) .  Insulin Pen Needle (BD PEN  NEEDLE NANO U/F) 32G X 4 MM MISC, USE AS DIRECTED 4 TIMES A DAY .  ketoconazole (NIZORAL) 2 % cream, Apply 1 application topically 2 (two) times daily. .  Lancets (ONETOUCH ULTRASOFT) lancets, Use as instructed .  ONETOUCH VERIO test strip, USE AS INSTRUCTED TO CHECK SUGAR 2 TIMES DAILY. .  pantoprazole (PROTONIX) 40 MG tablet, Take 1 tablet (40 mg total) by mouth daily. .  Polyethylene Glycol 400 (BLINK TEARS OP), Place 1 drop into both eyes daily as needed. Marland Kitchen  rOPINIRole (REQUIP) 3 MG tablet, Take 1 tablet 4 x /day for Restless Legs (Patient taking differently: Take 3 mg by mouth in the morning, at noon, in the evening, and at bedtime. ) .  tiZANidine (ZANAFLEX) 4 MG tablet, Take 1 tablet at Bedtime as needed for Muscle Spasms  Problem list She has Hyperlipidemia associated with type  2 diabetes mellitus ([place]); Essential hypertension; Diverticulosis of large intestine; History of colonic polyps; Morbid obesity due to excess calories ([place]); Vitamin D deficiency; Medication management; RLS (restless legs syndrome); Type 2 diabetes mellitus with hyperlipidemia (HCC); OSA and COPD overlap syndrome ([place]); Iron deficiency anemia; History of non-ST elevation myocardial infarction (NSTEMI); CKD stage 3 due to type 2 diabetes mellitus ([place]); CAD S/P percutaneous coronary angioplasty; DOE (dyspnea on exertion); Anxiety; DJD (degenerative joint disease), lumbosacral; Upper airway cough syndrome; Diabetic polyneuropathy associated with type 2 diabetes mellitus ([place]); DJD (degenerative joint disease) of knee; Chest pain with moderate risk for cardiac etiology; Chronic diastolic heart failure ([place]); Superior mesenteric artery stenosis ([place]); 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  Review of Systems See HPI    Objective:   Physical Exam eneral appearance: alert, no distress, WD/WN,  female HEENT: normocephalic, sclerae anicteric, TMs pearly, nares patent, no discharge or erythema, pharynx normal Oral cavity: MMM, no lesions Neck: supple, no lymphadenopathy, no thyromegaly, no masses Heart: RRR, normal S1, S2, no murmurs, decreased heart sounds due to body habitus Lungs: CTA bilaterally, coarse breath sounds, no wheezes, no rhonchi, or rales Abdomen: +bs, soft, obese, difficult exam due to body habitus, + diffuse tenderness pain epigastric and RUQ tenderness, no rebound, non distended, no masses, + hepatomegaly, no splenomegaly Musculoskeletal: nontender, no swelling, no obvious deformity Extremities: 1-2+ edema, no cyanosis, no clubbing Pulses: 2+ symmetric, upper and lower extremities, normal cap refill Neurological: alert, oriented x 3, CN2-12 intact, strength normal upper extremities and lower extremities, sensation decreased bilateral legs up to mid shin, DTRs 2+ throughout, no cerebellar signs, gait wide and antalgic Psychiatric: normal affect, behavior normal, pleasant       Assessment & Plan:   Pancytopenia (HCC) -     Lactate dehydrogenase -     Serum protein electrophoresis with reflex -     CBC with Differential/Platelet -     Iron,Total/Total Iron Binding Cap -     Ferritin - if negative? From medications, may refer to hematology No night sweats  Morbid obesity due to excess calories (HCC) - follow up 3 months for progress monitoring - increase veggies, decrease carbs - long discussion about weight loss, diet, and exercise  Chronic bronchitis, unspecified chronic bronchitis type ([place]) Continue follow up pulmonary  OSA and COPD overlap syndrome ([place]) -     Ambulatory referral to Sleep Studies  Diarrhea, unspecified type/AB pain/GERD -     Ambulatory referral to Gastroenterology- given number to call Keep food  diarrhea Will get AB [place] for gallbladder/pancrease and fecal fat- may need CT scan ? GERD causing some SOB/CP- negative H pylori may need EGD/look for cirrhosis- treat with protonix  Will refer to GI- she is overdue for colonoscopy- patient may not need it due to risk factors and age but she needs a discussion  Please go to the ER if you have any severe AB pain, unable to hold down food/water, blood in stool or vomit, chest pain, shortness of breath, or any worsening symptoms.

## 2019-10-04 IMAGING — MR MR HEAD W/O CM
8 of 9 series · 35 of 48 positions shown · non-contrast
Comparison: None.

CLINICAL DATA: Imbalance. TIA. Initial exam. The examination had to
be discontinued prior to completion due to patient refused further
imaging.

EXAM:
MRI HEAD WITHOUT CONTRAST
TECHNIQUE: Multiplanar, multiecho pulse sequences of the brain and surrounding
structures were obtained without intravenous contrast.

[Series 2: FLAIR · sagittal · 5.0mm · 0.47mm/px · 2 of 26 slices shown (1 of 2)]
[im 1/26]
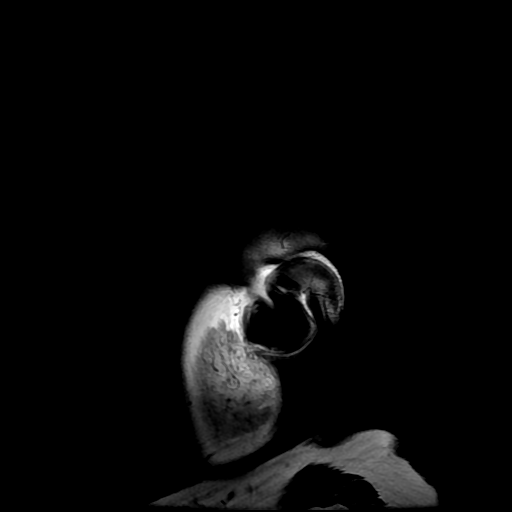
[im 26/26]
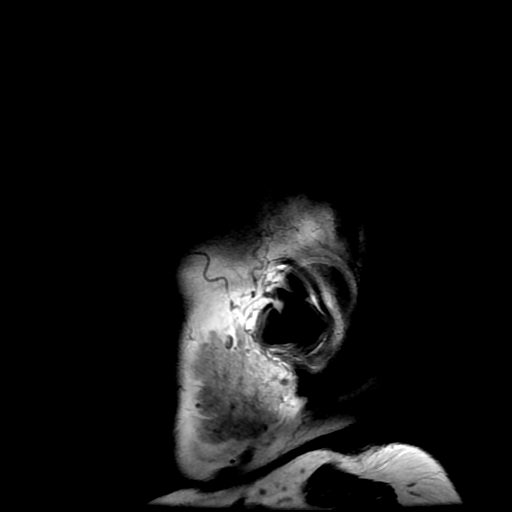

[Series 3: DWI · axial · 3.0mm · 0.94mm/px · z∈[-70,+86]mm · 9 of 108 slices shown (1 of 2)]
[im 1/108]
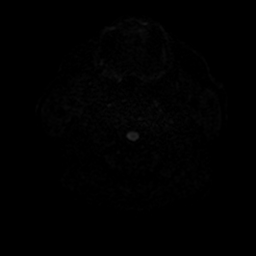
[im 14/108]
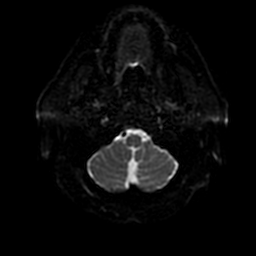
[im 27/108]
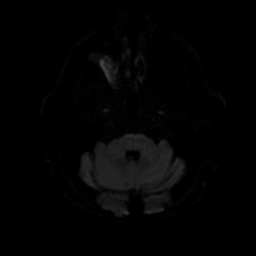
[im 41/108]
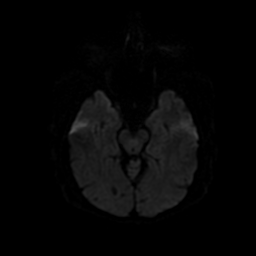
[im 54/108]
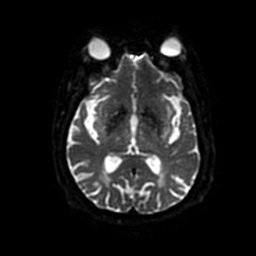
[im 67/108]
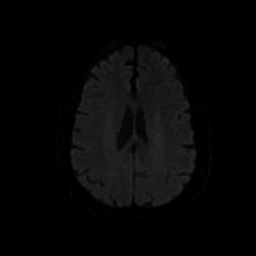
[im 81/108]
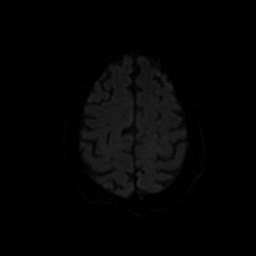
[im 94/108]
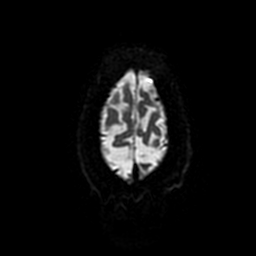
[im 108/108]
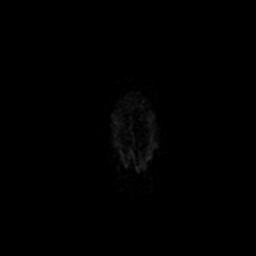

[Series 4: DWI · coronal · 4.0mm · 0.94mm/px · 6 of 72 slices shown (2 of 2)]
[im 1/72]
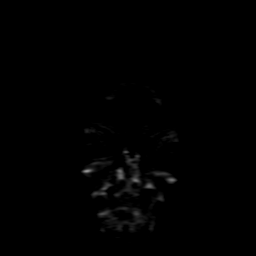
[im 15/72]
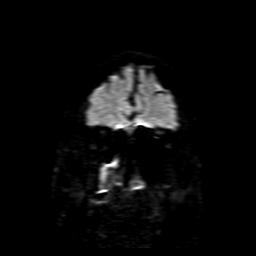
[im 29/72]
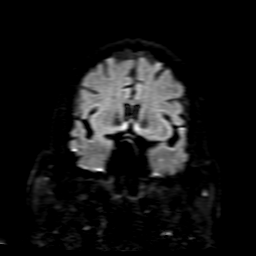
[im 43/72]
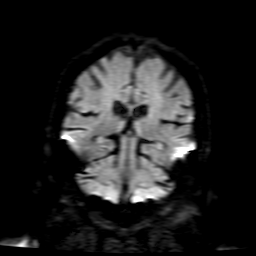
[im 57/72]
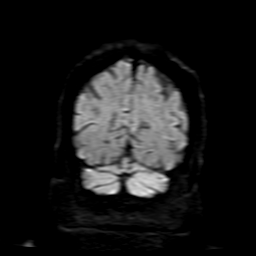
[im 72/72]
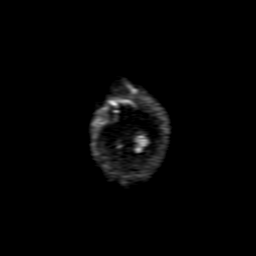

[Series 7: FLAIR · axial · 3.0mm · 0.45mm/px · z∈[-68,+86]mm · 2 of 27 slices shown (2 of 2)]
[im 1/27]
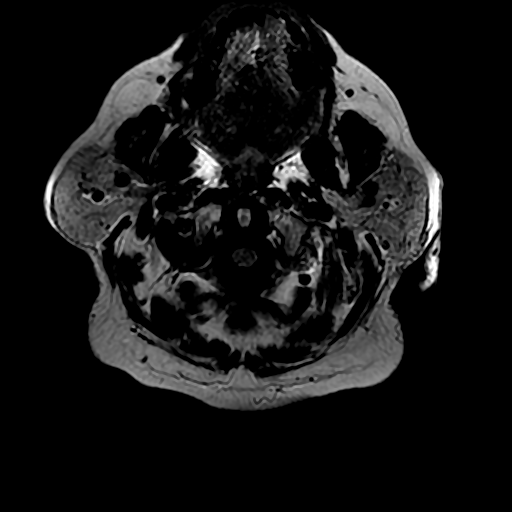
[im 27/27]
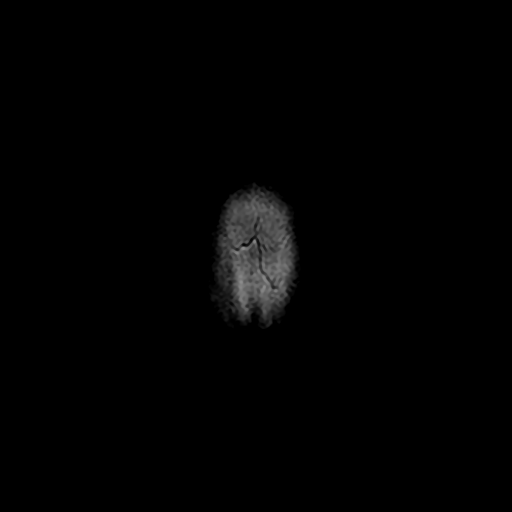

[Series 8: (person_name) · axial · 3.0mm · 0.47mm/px · z∈[-62,+91]mm · 8 of 104 slices shown]
[im 1/104]
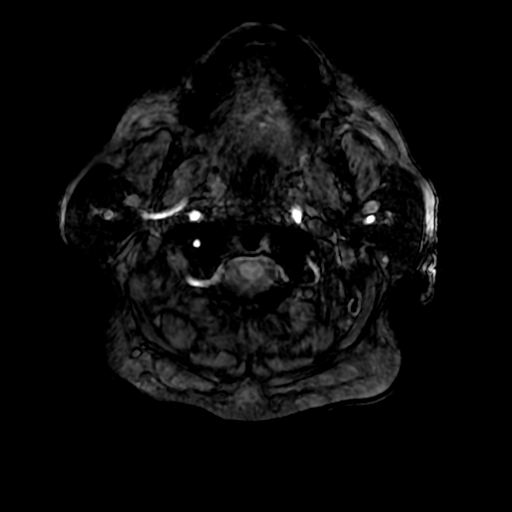
[im 13/104]
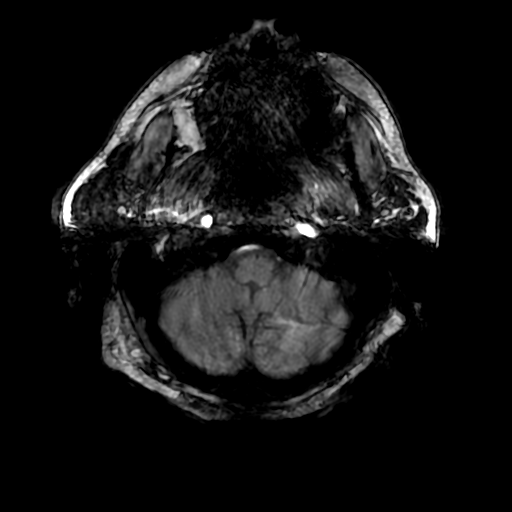
[im 26/104]
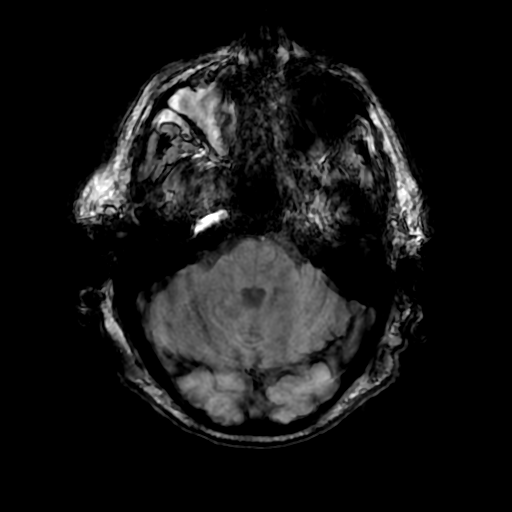
[im 39/104]
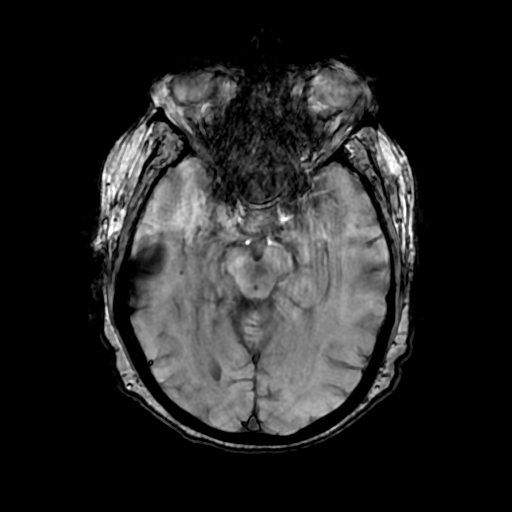
[im 65/104]
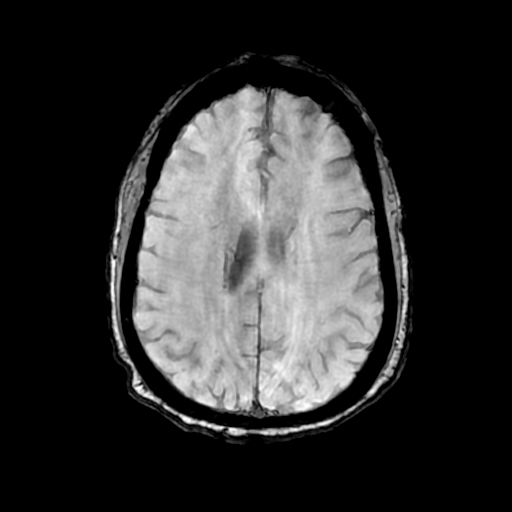
[im 78/104]
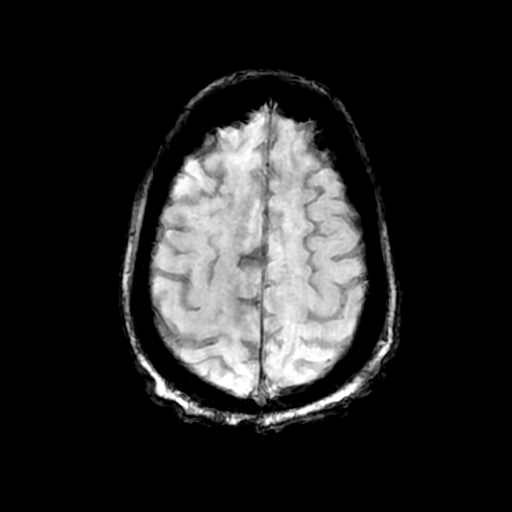
[im 91/104]
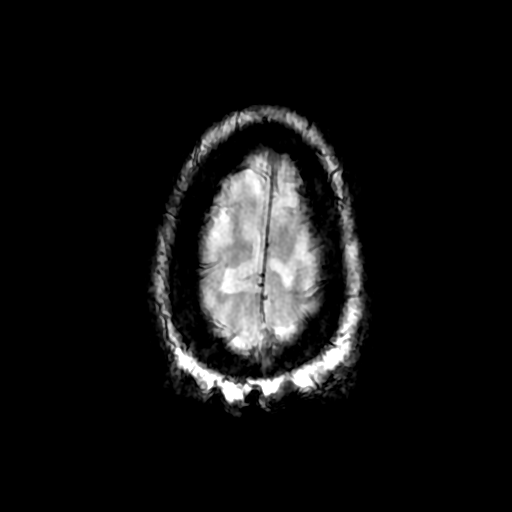
[im 104/104]
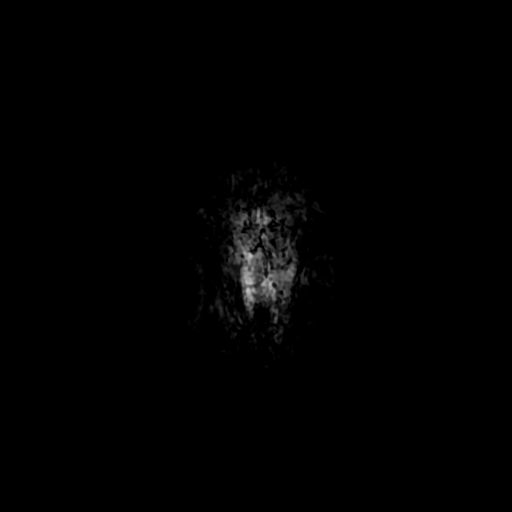

[Series 9: ax 3(person_name) · axial · 3.0mm · 0.94mm/px · 1 of 52 slices shown]
[im 1/52]
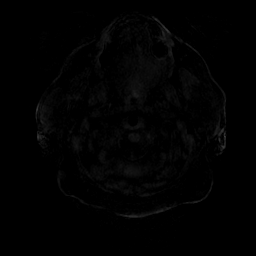

[Series 350: ADC · axial · 3.0mm · 0.94mm/px · z∈[-70,+86]mm · 4 of 54 slices shown (1 of 2)]
[im 1/54]
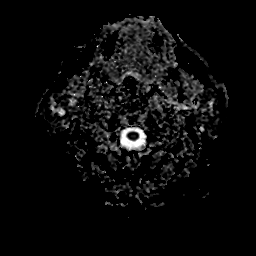
[im 18/54]
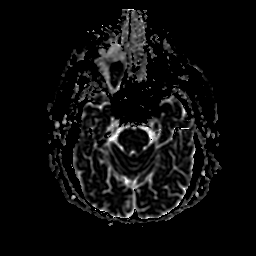
[im 36/54]
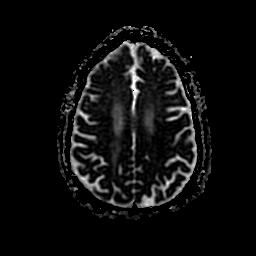
[im 54/54]
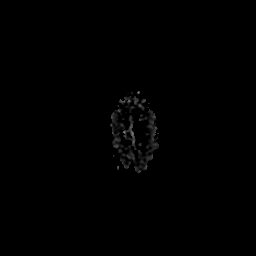

[Series 450: ADC · coronal · 4.0mm · 0.94mm/px · 3 of 36 slices shown (2 of 2)]
[im 1/36]
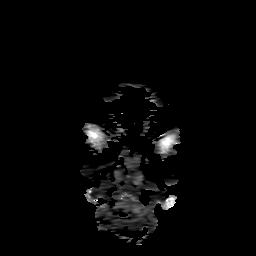
[im 18/36]
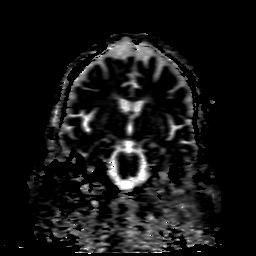
[im 36/36]
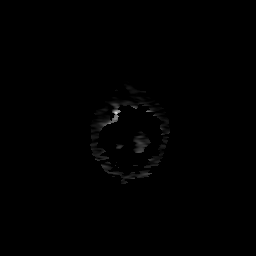

[35 of 48 positions shown; findings below may reference images not displayed]

FINDINGS: Brain: Mild atrophy and moderate diffuse white matter disease is
present. A remote cortical infarct is present in the right parietal
lobe. Dilated perivascular spaces are present in the basal ganglia.
The ventricles are of proportionate to the degree of atrophy. White
matter changes extend into the brainstem. The cerebellum is normal.
The internal auditory canals are within normal limits.

No acute infarct, hemorrhage, or mass lesion is present.

Vascular: Flow is present in the major intracranial arteries.

Skull and upper cervical spine: Grade 1 anterolisthesis is present
at C2-3 and C3-4. Craniocervical junction is otherwise normal.
Marrow signal is normal.

Sinuses/Orbits: Chronic right maxillary sinus opacification is
present. The paranasal sinuses and mastoid air cells are otherwise
clear. Bilateral lens replacements are noted. Globes and orbits are
otherwise unremarkable.
IMPRESSION: 1. No acute intracranial abnormality.
2. Advanced white matter disease. This likely reflects the sequela
of chronic microvascular ischemia.
3. Remote infarct of the right parietal lobe.
4. Chronic right maxillary sinus opacification.

## 2019-10-09 ENCOUNTER — Other Ambulatory Visit: Payer: Self-pay

## 2019-10-09 ENCOUNTER — Ambulatory Visit (INDEPENDENT_AMBULATORY_CARE_PROVIDER_SITE_OTHER): Payer: PPO | Admitting: Physician Assistant

## 2019-10-09 ENCOUNTER — Encounter: Payer: Self-pay | Admitting: Physician Assistant

## 2019-10-09 VITALS — BP 124/68 | HR 72 | Temp 97.3°F | Wt 244.0 lb

## 2019-10-09 DIAGNOSIS — J42 Unspecified chronic bronchitis: Secondary | ICD-10-CM

## 2019-10-09 DIAGNOSIS — R109 Unspecified abdominal pain: Secondary | ICD-10-CM

## 2019-10-09 DIAGNOSIS — G4733 Obstructive sleep apnea (adult) (pediatric): Secondary | ICD-10-CM | POA: Diagnosis not present

## 2019-10-09 DIAGNOSIS — J449 Chronic obstructive pulmonary disease, unspecified: Secondary | ICD-10-CM | POA: Diagnosis not present

## 2019-10-09 DIAGNOSIS — D649 Anemia, unspecified: Secondary | ICD-10-CM

## 2019-10-09 DIAGNOSIS — R197 Diarrhea, unspecified: Secondary | ICD-10-CM | POA: Diagnosis not present

## 2019-10-09 DIAGNOSIS — K2100 Gastro-esophageal reflux disease with esophagitis, without bleeding: Secondary | ICD-10-CM | POA: Diagnosis not present

## 2019-10-09 DIAGNOSIS — D61818 Other pancytopenia: Secondary | ICD-10-CM

## 2019-10-09 MED ORDER — PANTOPRAZOLE SODIUM 40 MG PO TBEC: 40.0000 mg | DELAYED_RELEASE_TABLET | Freq: Every day | ORAL | 1 refills | Status: DC

## 2019-10-09 NOTE — Patient Instructions (Addendum)
YOU CAN CALL TO MAKE AN ULTRASOUND..  I have put in an order for an ultrasound for you to have You can set them up at your convenience by calling this number [phone] You will likely have the ultrasound at [facility]  If you have any issues call our office and we will set this up for you.   Please call Dr. Loletha Carrow office for GI doctor Phone: [phone];   Will check for fecal fat  Please go to the ER if you have any severe AB pain, unable to hold down food/water, blood in stool or vomit, chest pain, shortness of breath, or any worsening symptoms.    Pancytopenia Pancytopenia is a condition in which a person has an abnormally low amount (deficiency) of the following blood cells:  Red blood cells (RBCs). Having too few RBCs is called anemia.  White blood cells (WBCs). Having too few WBCs is called leukopenia.  Cells that help the blood clot (platelets). Having too few platelets is called thrombocytopenia. Cells that become blood cells (stem cells) are made in the soft tissue inside the bones (bone marrow). All blood cells have a limited lifespan. Blood cells are constantly replaced with new blood cells from the bone marrow. Pancytopenia can be caused by any condition or disease that:  Destroys the ability of bone marrow to make blood cells.  Causes bone marrow to make blood cells that cannot survive after they leave the bone marrow. What are the causes? There are many possible causes of this condition. In some cases, the cause is not known. Common causes of the condition include:  A disease that causes bone marrow to make immature blood cells (megaloblastic anemia).  A blood disorder that makes bone marrow unable to produce enough new RBCs (aplastic anemia or bone marrow failure).  An enlarged spleen (hypersplenism). An enlarged spleen can trap blood cells and destroy them faster than they can be replaced.  Inherited diseases of the blood or bone  marrow.  Cancers that affect bone marrow.  Certain medicines, such as: ? Chemotherapy. ? Medicines that reduce the activity of the immune system (immunosuppressant medicines).  Exposure to radiation.  Severe infections. What increases the risk? You are more likely to develop this condition if:  You are 79?79 years old.  You are female.  You have a family history of a blood or bone marrow disease.  You have certain conditions, such as: ? Alcohol use disorder. ? HIV (human immunodeficiency virus) or AIDS (acquired immunodeficiency syndrome). ? Cancer. ? Conditions in which the body's disease-fighting system attacks normal tissues (autoimmune diseases). What are the signs or symptoms? Symptoms of this condition vary depending on the cause and may include:  Anemia.  Weakness.  Shortness of breath.  Unusual bruising and bleeding.  Frequent infections.  Fatigue.  Fever.  Pale skin.  Bone pain.  Night sweats.  Weight loss.  Headache.  Dizziness.  Feeling unusually cold. How is this diagnosed? This condition may be diagnosed based on:  Your symptoms.  Your medical history.  A physical exam.  Tests. These may include: ? Removal of a sample of bone marrow to be examined under a microscope (biopsy). This is done by inserting a needle into a bone to remove fluid and cells (aspiration). ? A complete blood count (CBC). This is a group of tests that measures characteristics of WBCs, RBCs, and platelets. ? A peripheral blood smear. This test examines your blood under a microscope to  provide information about drugs and diseases that affect RBCs, WBCs, and platelets. ? Reticulocyte count. This is a test that measures the amount of new or immature RBCs (reticulocytes) that are made by your bone marrow. ? Imaging studies of your spleen or liver, such as X-rays. ? A test to measure your vitamin B12 level. ? Tests for viruses. How is this treated? Treatment for this  condition depends on the cause. Treatment may include:  Immunosuppressant medicines.  Antibiotic medicine.  Vitamin B12. This may be given as a treatment for megaloblastic anemia.  Medicines that help the bone marrow make blood cells (bone marrow stimulating drugs).  A bone marrow transplant.  Receiving donated blood through an IV (blood transfusion).  A procedure to remove your spleen (splenectomy). This may be done as a treatment for hypersplenism. Follow these instructions at home: Caring for your body      Wash your hands often with soap and water. If soap and water are not available, use hand sanitizer.  Brush your teeth twice a day, and floss at least once a day. It is recommended that you visit the dentist every 6 months.  Stay up to date on your vaccinations, including a yearly (annual) flu shot. Ask your health care provider which vaccines you should get. These may include a pneumonia vaccine. Medicines  Take over-the-counter and prescription medicines only as told by your health care provider.  If you were prescribed an antibiotic medicine, take it as told by your health care provider. Do not stop taking the antibiotic even if you start to feel better. Lifestyle  Do not participate in contact sports or dangerous activities. Ask your health care provider what activities are safe for you.  During cold and flu season, avoid crowded places and avoid contact with people who are sick. Flu season is typically between the months of October and May. General instructions  Work with your health care provider to manage your condition and educate yourself about your condition.  Follow food safety recommendations as told by your health care provider.  Keep all follow-up visits as told by your health care provider. This is important. Contact a health care provider if you:  Have a fever.  Bruise or bleed easily.  Are dizzy.  Feel unusually weak or tired. Get help right  away if you have:  Bleeding that does not stop.  Wheezing or shortness of breath.  Chest pain. These symptoms may represent a serious problem that is an emergency. Do not wait to see if the symptoms will go away. Get medical help right away. Call your local emergency services (911 in the [place] Do not drive yourself to the hospital. Summary  Pancytopenia is a condition in which a person has an abnormally low amount of red blood cells, white blood cells, and platelets.  There are many possible causes of this condition.  Treatment for this condition depends on the cause.  Stay up to date on your vaccinations.  Do not participate in contact sports or dangerous activities. Ask your health care provider what activities are safe for you. This information is not intended to replace advice given to you by your health care provider. Make sure you discuss any questions you have with your health care provider. Document Revised: 01/30/2018 Document Reviewed: 01/30/2018 Elsevier Patient Education  2020 [employer].

## 2019-10-10 DIAGNOSIS — R197 Diarrhea, unspecified: Secondary | ICD-10-CM | POA: Diagnosis not present

## 2019-10-10 NOTE — Addendum Note (Signed)
Addended by: Vladimir Crofts on: 10/10/2019 08:58 AM   Modules accepted: Orders

## 2019-10-11 ENCOUNTER — Other Ambulatory Visit: Payer: Self-pay

## 2019-10-11 ENCOUNTER — Encounter: Payer: Self-pay | Admitting: Nurse Practitioner

## 2019-10-11 ENCOUNTER — Other Ambulatory Visit: Payer: PPO

## 2019-10-11 DIAGNOSIS — D61818 Other pancytopenia: Secondary | ICD-10-CM | POA: Diagnosis not present

## 2019-10-11 NOTE — Addendum Note (Signed)
Addended by: Vicie Mutters R on: 10/11/2019 10:57 AM   Modules accepted: Orders

## 2019-10-11 NOTE — Progress Notes (Signed)
Subjective:    Patient ID: Janet Choi, female    DOB: April 08, 1941, 79 y.o.   MRN: [id number]  HPI 79 y.o. morbidly obese female with history of NSTEMI2017with PCI of the LAD and LCx, hypertensive heart disease, OSA, DM, HTN, HLD, morbid obesity, IBS, GERD,and CKDIII presents for follow up for abnormal labs.   She was recently in the hospital for 07/24/19 for chest pain/heaviness. Normal eval from cardio with Leane Call 07/25/19 that was negative.  CXR 07/24/2019, chest CT Has see Dr. Lamonte Sakai and getting work up with him, PFT's pending.   Patient with drop in H/H from 10.4 on 05/25 to now 8.8 with pancytopenia.  SPEP with possible very small band, but final interpretation was negative.   Allopurinol was stopped. Going to see Hematology on 06/10.   She is very tired, some worsening shortness of breath.   She was complaining of intermittent long standing diarrhea for years, intermittent, she continues to have black stool occ and has not been taking pepto or iron. States the diarrhea has improved some.  Treated with protonix 40mg  for possible GERD/LPR/ulcer, only taking once a day.  She is not on iron at this time She is starting to have epigastric pain with food which is new.   Lab Results  Component Value Date   IRON 43 (L) 10/09/2019   TIBC 410 10/09/2019   FERRITIN 10 (L) 10/09/2019    CBC Latest Ref Rng & Units 10/11/2019 10/09/2019 10/02/2019  WBC 3.8 - 10.8 Thousand/uL 2.0(L) 2.1(L) 2.2(L)  Hemoglobin 11.7 - 15.5 g/dL 8.8(L) 9.2(L) 10.4(L)  Hematocrit 35.0 - 45.0 % 27.0(L) 28.1(L) 32.6(L)  Platelets 140 - 400 Thousand/uL 113(L) 120(L) 120(L)   She had a normal pancrease, negative H Pylori.  Recent + fecal stool showing possible chronic pancreatitis from her long standing diabetes.  She states that she does not have night sweats, no weight loss, no bone pain. She does have easy bruising.   She is scheduled for [place] of AB Wednesday, may need CT scan pending results.     She is overdue for colonoscopy- last one 2010 AB [place] 2007  [name] is her sister, on [employer]IP[employer]  She has the freestyle [place] and has just started using it.  High has been 170, lowest is 136. States sugars have been high since COVID   Lab Results  Component Value Date   HGBA1C 9.5 (H) 07/25/2019    There were no vitals taken for this visit.  Medications  Current Outpatient Medications (Endocrine & Metabolic):  .  insulin aspart (NOVOLOG FLEXPEN) 100 UNIT/ML FlexPen, Inject 50  to 75 units 3 x /day before meals as directed for Diabetes (Patient taking differently: Inject 20 Units into the skin 3 (three) times daily with meals. Inject 50  to 75 units 3 x /day before meals as directed for Diabetes) .  Insulin Glargine (BASAGLAR KWIKPEN) 100 UNIT/ML SOPN, Inject 70 units Daily for Diabetes (Patient taking differently: Inject 70 Units into the skin at bedtime. Inject 70 units Daily for Diabetes) .  liraglutide (VICTOZA) 18 MG/3ML SOPN, Inject 0.2 mLs (1.2 mg total) into the skin every morning.  Current Outpatient Medications (Cardiovascular):  .  atorvastatin (LIPITOR) 80 MG tablet, TAKE 1 TABLET (80 MG TOTAL) BY MOUTH DAILY AT 6 PM. (Patient taking differently: Take 80 mg by mouth every evening. ) .  furosemide (LASIX) 40 MG tablet, Take 40 mg by mouth as needed for fluid. Marland Kitchen  losartan (COZAAR) 50 MG tablet, Take 1  tablet Daily for BP & Diabetic Kidney Protection .  metoprolol succinate (TOPROL-XL) 25 MG 24 hr tablet, Take 1 tablet (25 mg total) by mouth at bedtime. .  nitroGLYCERIN (NITROSTAT) 0.4 MG SL tablet, Place 1 tablet (0.4 mg total) under the tongue every 5 (five) minutes x 3 doses as needed for chest pain.  Current Outpatient Medications (Respiratory):  .  albuterol (VENTOLIN HFA) 108 (90 Base) MCG/ACT inhaler, Inhale 2 puffs into the lungs every 4 (four) hours as needed for wheezing or shortness of breath. .  benzonatate (TESSALON) 200 MG capsule, Take 1 capsule (200 mg total) by mouth  3 (three) times daily as needed for cough (Max: 600mg  per day). .  Budeson-Glycopyrrol-Formoterol (BREZTRI AEROSPHERE) 160-9-4.8 MCG/ACT AERO, Inhale 2 puffs into the lungs in the morning and at bedtime.  Current Outpatient Medications (Analgesics):  .  acetaminophen (TYLENOL) 325 MG tablet, Take 2 tablets (650 mg total) by mouth every 4 (four) hours as needed for headache or mild pain. Marland Kitchen  aspirin EC 81 MG EC tablet, Take 1 tablet (81 mg total) by mouth daily.  Current Outpatient Medications (Hematological):  .  vitamin B-12 (CYANOCOBALAMIN) 1000 MCG tablet, Take 1,000 mcg by mouth daily. .  ferrous sulfate 325 (65 FE) MG tablet, Take 325 mg by mouth daily with breakfast.  Current Outpatient Medications (Other):  Marland Kitchen  buPROPion (WELLBUTRIN XL) 300 MG 24 hr tablet, Take 1 tablet (300 mg total) by mouth daily. Take 1 tablet Daily for Mood .  Cholecalciferol ([employer] VIT D 5000 HIGH-POTENCY) 5000 units capsule, Take 5,000 Units by mouth 2 (two) times daily.  .  Continuous Blood Gluc Receiver (FREESTYLE LIBRE 14 DAY READER) DEVI, 1 Device by Does not apply route every 14 (fourteen) days. .  Continuous Blood Gluc Sensor (FREESTYLE LIBRE 14 DAY SENSOR) MISC, Apply 1 application topically every 14 (fourteen) days. .  diclofenac sodium (VOLTAREN) 1 % GEL, Apply 4 g topically 4 (four) times daily. (Patient taking differently: Apply 4 g topically daily as needed (For had pain). ) .  escitalopram (LEXAPRO) 20 MG tablet, Take 1 tablet (20 mg total) by mouth daily. (Patient taking differently: Take 30 mg by mouth daily. ) .  gabapentin (NEURONTIN) 100 MG capsule, Take 1 to 2 capsules 3 to 4 x /day as needed for Painful Diabetic Neuropathy (Patient taking differently: Take 200 mg by mouth in the morning, at noon, in the evening, and at bedtime. ) .  Insulin Pen Needle (BD PEN NEEDLE NANO U/F) 32G X 4 MM MISC, USE AS DIRECTED 4 TIMES A DAY .  ketoconazole (NIZORAL) 2 % cream, Apply 1 application topically 2 (two)  times daily. .  Lancets (ONETOUCH ULTRASOFT) lancets, Use as instructed .  ONETOUCH VERIO test strip, USE AS INSTRUCTED TO CHECK SUGAR 2 TIMES DAILY. .  pantoprazole (PROTONIX) 40 MG tablet, Take 1 tablet (40 mg total) by mouth 2 (two) times daily before a meal. .  Polyethylene Glycol 400 (BLINK TEARS OP), Place 1 drop into both eyes daily as needed. Marland Kitchen  rOPINIRole (REQUIP) 3 MG tablet, Take 1 tablet 4 x /day for Restless Legs (Patient taking differently: Take 3 mg by mouth in the morning, at noon, in the evening, and at bedtime. ) .  tiZANidine (ZANAFLEX) 4 MG tablet, Take 1 tablet at Bedtime as needed for Muscle Spasms  Problem list She has Hyperlipidemia associated with type 2 diabetes mellitus ([place]); Essential hypertension; Diverticulosis of large intestine; History of colonic polyps; Morbid obesity due to  excess calories ([place]); Vitamin D deficiency; Medication management; RLS (restless legs syndrome); Type 2 diabetes mellitus with hyperlipidemia (HCC); OSA and COPD overlap syndrome ([place]); Iron deficiency anemia; History of non-ST elevation myocardial infarction (NSTEMI); CKD stage 3 due to type 2 diabetes mellitus ([place]); CAD S/P percutaneous coronary angioplasty; DOE (dyspnea on exertion); Anxiety; DJD (degenerative joint disease), lumbosacral; Upper airway cough syndrome; Diabetic polyneuropathy associated with type 2 diabetes mellitus ([place]); DJD (degenerative joint disease) of knee; Chest pain with moderate risk for cardiac etiology; Chronic diastolic heart failure ([place]); Superior mesenteric artery stenosis ([place]); 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  Review of Systems See HPI    Objective:   Physical Exam eneral appearance: alert,  no distress, WD/WN,  female HEENT: normocephalic, sclerae anicteric, TMs pearly, nares patent, no discharge or erythema, pharynx normal Oral cavity: MMM, no lesions Neck: supple, no lymphadenopathy, no thyromegaly, no masses Heart: RRR, normal S1, S2, no murmurs, decreased heart sounds due to body habitus Lungs: CTA bilaterally, coarse breath sounds, no wheezes, no rhonchi, or rales Abdomen: +bs, soft, obese, difficult exam due to body habitus, + diffuse tenderness pain epigastric and RUQ tenderness, no rebound, non distended, no masses, + hepatomegaly, no splenomegaly Musculoskeletal: nontender, no swelling, no obvious deformity Extremities: 1-2+ edema, no cyanosis, no clubbing Pulses: 2+ symmetric, upper and lower extremities, normal cap refill Neurological: alert, oriented x 3, CN2-12 intact, strength normal upper extremities and lower extremities, sensation decreased bilateral legs up to mid shin, DTRs 2+ throughout, no cerebellar signs, gait wide and antalgic Psychiatric: normal affect, behavior normal, pleasant      Assessment & Plan:   Pancytopenia (HCC) -     CBC with Differential/Platelet -     COMPLETE METABOLIC PANEL WITH GFR Stopped allopurinol Normal SPEP Normal elevated retic count, will still refer to hematology for evaluation  Anemia, unspecified type -     CBC with Differential/Platelet -     COMPLETE METABOLIC PANEL WITH GFR Very concerning for GI bleed, increase protonix to BID Check CBC today, if below 8 will send to ER If any worsening symptoms will refer to GI  Abdominal pain, unspecified abdominal location -     CBC with Differential/Platelet -     COMPLETE METABOLIC PANEL WITH GFR  Gastroesophageal reflux disease with esophagitis without hemorrhage -     pantoprazole (PROTONIX) 40 MG tablet; Take 1 tablet (40 mg total) by mouth 2 (two) times daily before a meal. Increase to 2 a day for possible ulcer  Pancreatic insufficiency May need creon or possible  CT AB to evaluate pancrease  The patient was advised to call immediately if she has any concerning symptoms in the interval. The patient voices understanding of current treatment options and is in agreement with the current care plan.The patient knows to call the clinic with any problems, questions or concerns or go to the ER if any further progression of symptoms.   Future Appointments  Date Time Provider [place]  10/17/2019  9:30 AM [facility] [place] 5 [facility]  10/18/2019 11:00 AM Brunetta Genera, MD [facility] None  10/18/2019 11:45 AM [facility] ONLY [facility] None  11/05/2019 11:00 AM Noralyn Pick, NP [facility]  11/05/2019  2:30 PM [name], MD [facility] None  05/14/2020  11:00 AM [name], MD [facility] None  09/16/2020 11:15 AM Vicie Mutters, PA-C [facility] None

## 2019-10-12 ENCOUNTER — Telehealth: Payer: Self-pay | Admitting: Hematology

## 2019-10-12 LAB — CBC WITH DIFFERENTIAL/PLATELET
Absolute Monocytes: 103 cells/uL — ABNORMAL LOW (ref 200–950)
Absolute Monocytes: 92 cells/uL — ABNORMAL LOW (ref 200–950)
Basophils Absolute: 0 cells/uL (ref 0–200)
Basophils Absolute: 10 cells/uL (ref 0–200)
Basophils Relative: 0 %
Basophils Relative: 0.5 %
Eosinophils Absolute: 61 cells/uL (ref 15–500)
Eosinophils Absolute: 72 cells/uL (ref 15–500)
Eosinophils Relative: 2.9 %
Eosinophils Relative: 3.6 %
HCT: 28.1 % — ABNORMAL LOW (ref 35.0–45.0)
HCT: 27 % — ABNORMAL LOW (ref 35.0–45.0)
Hemoglobin: 9.2 g/dL — ABNORMAL LOW (ref 11.7–15.5)
Hemoglobin: 8.8 g/dL — ABNORMAL LOW (ref 11.7–15.5)
Lymphs Abs: 743 cells/uL — ABNORMAL LOW (ref 850–3900)
Lymphs Abs: 694 cells/uL — ABNORMAL LOW (ref 850–3900)
MCH: 33.2 pg — ABNORMAL HIGH (ref 27.0–33.0)
MCH: 32.5 pg (ref 27.0–33.0)
MCHC: 32.7 g/dL (ref 32.0–36.0)
MCHC: 32.6 g/dL (ref 32.0–36.0)
MCV: 101.4 fL — ABNORMAL HIGH (ref 80.0–100.0)
MCV: 99.6 fL (ref 80.0–100.0)
MPV: 10.8 fL (ref 7.5–12.5)
MPV: 10.4 fL (ref 7.5–12.5)
Monocytes Relative: 4.9 %
Monocytes Relative: 4.6 %
Neutro Abs: 1193 cells/uL — ABNORMAL LOW (ref 1500–7800)
Neutro Abs: 1132 cells/uL — ABNORMAL LOW (ref 1500–7800)
Neutrophils Relative %: 56.8 %
Neutrophils Relative %: 56.6 %
Platelets: 120 10*3/uL — ABNORMAL LOW (ref 140–400)
Platelets: 113 10*3/uL — ABNORMAL LOW (ref 140–400)
RBC: 2.77 10*6/uL — ABNORMAL LOW (ref 3.80–5.10)
RBC: 2.71 10*6/uL — ABNORMAL LOW (ref 3.80–5.10)
RDW: 13.6 % (ref 11.0–15.0)
RDW: 13.4 % (ref 11.0–15.0)
Total Lymphocyte: 35.4 %
Total Lymphocyte: 34.7 %
WBC: 2.1 10*3/uL — ABNORMAL LOW (ref 3.8–10.8)
WBC: 2 10*3/uL — ABNORMAL LOW (ref 3.8–10.8)

## 2019-10-12 LAB — COMPLETE METABOLIC PANEL WITH GFR
AG Ratio: 1.8 (calc) (ref 1.0–2.5)
ALT: 7 U/L (ref 6–29)
AST: 7 U/L — ABNORMAL LOW (ref 10–35)
Albumin: 3.7 g/dL (ref 3.6–5.1)
Alkaline phosphatase (APISO): 80 U/L (ref 37–153)
BUN/Creatinine Ratio: 16 (calc) (ref 6–22)
BUN: 20 mg/dL (ref 7–25)
CO2: 27 mmol/L (ref 20–32)
Calcium: 8.7 mg/dL (ref 8.6–10.4)
Chloride: 108 mmol/L (ref 98–110)
Creat: 1.26 mg/dL — ABNORMAL HIGH (ref 0.60–0.93)
GFR, Est African American: 47 mL/min/{1.73_m2} — ABNORMAL LOW (ref >=60)
GFR, Est Non African American: 41 mL/min/{1.73_m2} — ABNORMAL LOW (ref >=60)
Globulin: 2.1 g/dL (calc) (ref 1.9–3.7)
Glucose, Bld: 285 mg/dL — ABNORMAL HIGH (ref 65–99)
Potassium: 4.8 mmol/L (ref 3.5–5.3)
Sodium: 143 mmol/L (ref 135–146)
Total Bilirubin: 0.5 mg/dL (ref 0.2–1.2)
Total Protein: 5.8 g/dL — ABNORMAL LOW (ref 6.1–8.1)

## 2019-10-12 LAB — PROTEIN ELECTROPHORESIS, SERUM, WITH REFLEX
Albumin ELP: 3.3 g/dL — ABNORMAL LOW (ref 3.8–4.8)
Alpha 1: 0.4 g/dL — ABNORMAL HIGH (ref 0.2–0.3)
Alpha 2: 0.9 g/dL (ref 0.5–0.9)
Beta 2: 0.3 g/dL (ref 0.2–0.5)
Beta Globulin: 0.4 g/dL (ref 0.4–0.6)
Gamma Globulin: 0.4 g/dL — ABNORMAL LOW (ref 0.8–1.7)
Total Protein: 5.7 g/dL — ABNORMAL LOW (ref 6.1–8.1)

## 2019-10-12 LAB — IRON, TOTAL/TOTAL IRON BINDING CAP
%SAT: 10 % (calc) — ABNORMAL LOW (ref 16–45)
Iron: 43 ug/dL — ABNORMAL LOW (ref 45–160)
TIBC: 410 mcg/dL (calc) (ref 250–450)

## 2019-10-12 LAB — RETICULOCYTES
ABS Retic: 116530 cells/uL — ABNORMAL HIGH (ref 20000–8000)
Retic Ct Pct: 4.3 %

## 2019-10-12 LAB — FERRITIN: Ferritin: 10 ng/mL — ABNORMAL LOW (ref 16–288)

## 2019-10-12 LAB — IFE INTERPRETATION: Immunofix Electr Int: NOT DETECTED

## 2019-10-12 LAB — LACTATE DEHYDROGENASE: LDH: 134 U/L (ref 120–250)

## 2019-10-12 NOTE — Telephone Encounter (Signed)
Received a new hem referral from Dr. Melford Aase for panyctopenia. [name] returned my call and has been scheduled to see [name] on 6/10 at 11am. Pt and her sister are aware to arrive 15 minutes early.

## 2019-10-14 LAB — FECAL FAT, QUALITATIVE: FECAL FAT, QUALITATIVE: ABNORMAL — AB

## 2019-10-15 ENCOUNTER — Ambulatory Visit (INDEPENDENT_AMBULATORY_CARE_PROVIDER_SITE_OTHER): Payer: PPO | Admitting: Physician Assistant

## 2019-10-15 ENCOUNTER — Encounter: Payer: Self-pay | Admitting: Emergency Medicine

## 2019-10-15 ENCOUNTER — Ambulatory Visit (INDEPENDENT_AMBULATORY_CARE_PROVIDER_SITE_OTHER): Payer: PPO | Admitting: Emergency Medicine

## 2019-10-15 ENCOUNTER — Other Ambulatory Visit: Payer: Self-pay

## 2019-10-15 ENCOUNTER — Encounter: Payer: Self-pay | Admitting: Physician Assistant

## 2019-10-15 ENCOUNTER — Ambulatory Visit: Payer: PPO | Admitting: Physician Assistant

## 2019-10-15 DIAGNOSIS — D649 Anemia, unspecified: Secondary | ICD-10-CM

## 2019-10-15 DIAGNOSIS — R0600 Dyspnea, unspecified: Secondary | ICD-10-CM | POA: Diagnosis not present

## 2019-10-15 DIAGNOSIS — R109 Unspecified abdominal pain: Secondary | ICD-10-CM

## 2019-10-15 DIAGNOSIS — K2100 Gastro-esophageal reflux disease with esophagitis, without bleeding: Secondary | ICD-10-CM | POA: Diagnosis not present

## 2019-10-15 DIAGNOSIS — D61818 Other pancytopenia: Secondary | ICD-10-CM | POA: Diagnosis not present

## 2019-10-15 DIAGNOSIS — J449 Chronic obstructive pulmonary disease, unspecified: Secondary | ICD-10-CM | POA: Diagnosis not present

## 2019-10-15 DIAGNOSIS — R911 Solitary pulmonary nodule: Secondary | ICD-10-CM

## 2019-10-15 DIAGNOSIS — K8689 Other specified diseases of pancreas: Secondary | ICD-10-CM

## 2019-10-15 DIAGNOSIS — G4733 Obstructive sleep apnea (adult) (pediatric): Secondary | ICD-10-CM

## 2019-10-15 DIAGNOSIS — R0609 Other forms of dyspnea: Secondary | ICD-10-CM

## 2019-10-15 LAB — COMPLETE METABOLIC PANEL WITH GFR
AG Ratio: 1.9 (calc) (ref 1.0–2.5)
ALT: 6 U/L (ref 6–29)
AST: 8 U/L — ABNORMAL LOW (ref 10–35)
Albumin: 3.8 g/dL (ref 3.6–5.1)
Alkaline phosphatase (APISO): 80 U/L (ref 37–153)
BUN/Creatinine Ratio: 18 (calc) (ref 6–22)
BUN: 23 mg/dL (ref 7–25)
CO2: 28 mmol/L (ref 20–32)
Calcium: 9.1 mg/dL (ref 8.6–10.4)
Chloride: 106 mmol/L (ref 98–110)
Creat: 1.3 mg/dL — ABNORMAL HIGH (ref 0.60–0.93)
GFR, Est African American: 46 mL/min/{1.73_m2} — ABNORMAL LOW (ref >=60)
GFR, Est Non African American: 39 mL/min/{1.73_m2} — ABNORMAL LOW (ref >=60)
Globulin: 2 g/dL (calc) (ref 1.9–3.7)
Glucose, Bld: 107 mg/dL — ABNORMAL HIGH (ref 65–99)
Potassium: 4.6 mmol/L (ref 3.5–5.3)
Sodium: 141 mmol/L (ref 135–146)
Total Bilirubin: 0.5 mg/dL (ref 0.2–1.2)
Total Protein: 5.8 g/dL — ABNORMAL LOW (ref 6.1–8.1)

## 2019-10-15 LAB — CBC WITH DIFFERENTIAL/PLATELET
Absolute Monocytes: 133 cells/uL — ABNORMAL LOW (ref 200–950)
Basophils Absolute: 10 cells/uL (ref 0–200)
Basophils Relative: 0.4 %
Eosinophils Absolute: 60 cells/uL (ref 15–500)
Eosinophils Relative: 2.4 %
HCT: 26.5 % — ABNORMAL LOW (ref 35.0–45.0)
Hemoglobin: 8.7 g/dL — ABNORMAL LOW (ref 11.7–15.5)
Lymphs Abs: 945 cells/uL (ref 850–3900)
MCH: 32.8 pg (ref 27.0–33.0)
MCHC: 32.8 g/dL (ref 32.0–36.0)
MCV: 100 fL (ref 80.0–100.0)
MPV: 10.2 fL (ref 7.5–12.5)
Monocytes Relative: 5.3 %
Neutro Abs: 1353 cells/uL — ABNORMAL LOW (ref 1500–7800)
Neutrophils Relative %: 54.1 %
Platelets: 140 10*3/uL (ref 140–400)
RBC: 2.65 10*6/uL — ABNORMAL LOW (ref 3.80–5.10)
RDW: 13.8 % (ref 11.0–15.0)
Total Lymphocyte: 37.8 %
WBC: 2.5 10*3/uL — ABNORMAL LOW (ref 3.8–10.8)

## 2019-10-15 MED ORDER — PANTOPRAZOLE SODIUM 40 MG PO TBEC: 40.0000 mg | DELAYED_RELEASE_TABLET | Freq: Two times a day (BID) | ORAL | 1 refills | Status: DC

## 2019-10-15 MED ORDER — BREZTRI AEROSPHERE 160-9-4.8 MCG/ACT IN AERO
2.0000 | INHALATION_SPRAY | Freq: Two times a day (BID) | RESPIRATORY_TRACT | 2 refills | Status: DC
Start: 2019-10-15 — End: 2020-12-14

## 2019-10-15 NOTE — Patient Instructions (Signed)
Please continue Breztri 2 puffs twice a day.  Rinse and gargle after using. Keep albuterol available use 2 puffs up to every 4 hours if needed for shortness of breath, chest tightness, wheezing. We will arrange for full pulmonary function testing to further evaluate for COPD and other causes of shortness of breath. We will arrange for a split-night sleep study so that we can work on getting you on CPAP We will perform a walking oximetry at your next visit Your CT scan of the chest shows a small pulmonary nodule.  You need a repeat CT scan of the chest in October 2021. Agree with office visit with hematology. Continue to follow with cardiology and dose your diuretics as per their instructions. Follow with Dr. Lamonte Sakai next available with full pulmonary function testing and walking oximetry on the same day.

## 2019-10-15 NOTE — Progress Notes (Signed)
Subjective:    Patient ID: Janet Choi, female    DOB: [birth year], 79 y.o.   MRN: [id number]  HPI 79 year old woman, former smoker (29 pack years) with a history of CAD and systolic CHF, arthritis, chronic kidney disease, hypertension, diabetes mellitus, OSA (not on CPAP), restless leg syndrome.  Also under evaluation for anemia, pancytopenia, about to see hematology.  She is referred for dyspnea.   She reports exertional dyspnea, some ups and downs. Difficulty walking through her house. She describes a tightness in her chest that can be present at rest or when active. The tightness is associated with dyspnea. Does minimal housework, difficulty to go shopping. She was having cough in May, was treated with pred taper and azithro >> flares about 2x a year. She has albuterol, and was started on breztri in May 2021. She isn't sure whether they are helping her much. Denies any CP.   She is about to have a repeat PSG, is willing to retry CPAP.   Spirometry 01/23/2018 reviewed by me, shows grossly normal airflows.  She underwent a CT chest 08/28/19 that I have reviewed, shows a 52mm LLL nodule not seen on scan in 2017.      Review of Systems As per HPI  Past Medical History:  Diagnosis Date  . Anemia    hx (04/14/2016)  . Anxiety   . Arthritis    "severe in my back; hands; ankles" (04/14/2016)  . Atypical chest pain 01/24/2018   Midline pain absent supine "constant" daytime since 12/31/17 > resolved as of 02/20/2018 on gerd/ gas diet   . Basal cell carcinoma    "several burned off; one cut off"  . CAD in native artery    a. NSTEMI 04/2016 - s/p DES toLAD and LCx. PCI to LCx notable for microembolization during cath.  . Chest pain- reslved with stopping Brilinta now on Plaix 04/16/2016  . Chronic lower back pain   . CKD (chronic kidney disease), stage III   . Depression   . DJD (degenerative joint disease)   . Family history of adverse reaction to anesthesia    "half-sister used to get  real sick" (04/14/2016)  . GERD (gastroesophageal reflux disease)   . History of gout   . Hyperlipidemia   . Hypertension   . IBS (irritable bowel syndrome)   . Ischemic cardiomyopathy    a. 04/2016: EF 40-50% by cath, 50-55% +WMA by echo.  . 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 ([place]) ~ 2015  . Morbid obesity ([place])   . NSTEMI (non-ST elevated myocardial infarction) ([place]) 04/14/2016  . OSA on CPAP   . Peripheral neuropathy   . Peripheral vascular disease ([place])   . RLS (restless legs syndrome)   . Spinal stenosis   . Type II diabetes mellitus ([place])   . Vitamin D deficiency      Family History  Problem Relation Age of Onset  . Heart disease Mother   . Kidney disease Father   . AAA (abdominal aortic aneurysm) Father   . Heart disease Son   . Colon cancer Neg Hx   . Colon polyps Neg Hx   . Esophageal cancer Neg Hx   . Pancreatic cancer Neg Hx   . Stomach cancer Neg Hx   . Liver disease Neg Hx   . Diabetes Neg Hx      Social History   Socioeconomic History  . Marital status: Widowed    Spouse name: Not on file  . Number  of children: Not on file  . Years of education: Not on file  . Highest education level: Not on file  Occupational History  . Not on file  Tobacco Use  . Smoking status: Former Smoker    Packs/day: 2.50    Years: 25.00    Pack years: 62.50    Types: Cigarettes    Quit date: 11/11/1983    Years since quitting: 35.9  . Smokeless tobacco: Former Network engineer and Sexual Activity  . Alcohol use: No    Alcohol/week: 0.0 standard drinks  . Drug use: No  . Sexual activity: Never  Other Topics Concern  . Not on file  Social History Narrative  . Not on file   Social Determinants of Health   Financial Resource Strain:   . Difficulty of Paying Living Expenses:   Food Insecurity:   . Worried About Charity fundraiser in the Last Year:   . Arboriculturist in the Last Year:   Transportation Needs:   . Film/video editor (Medical):   Marland Kitchen Lack of  Transportation (Non-Medical):   Physical Activity:   . Days of Exercise per Week:   . Minutes of Exercise per Session:   Stress:   . Feeling of Stress :   Social Connections:   . Frequency of Communication with Friends and Family:   . Frequency of Social Gatherings with Friends and Family:   . Attends Religious Services:   . Active Member of Clubs or Organizations:   . Attends Archivist Meetings:   Marland Kitchen Marital Status:   Intimate Partner Violence:   . Fear of Current or Ex-Partner:   . Emotionally Abused:   Marland Kitchen Physically Abused:   . Sexually Abused:      Allergies  Allergen Reactions  . Brilinta [Ticagrelor] Shortness Of Breath and Other (See Comments)    Chest pain (also)  . Aspirin Other (See Comments)    Can tolerate in small doses (is already on a blood thinner AND has kidney disease)  . Nsaids Other (See Comments)    Patient is taking a blood thinner and has kidney disease  . Minocycline Hcl Other (See Comments)    Welts  . Oruvail [Ketoprofen] Other (See Comments)    Has kidney disease and is taking a blood thinner  . Tricor [Fenofibrate]     Reaction unknown  . Vasotec [Enalaprilat]     Reaction unknown  . Zinc     Reaction unknown     Outpatient Medications Prior to Visit  Medication Sig Dispense Refill  . acetaminophen (TYLENOL) 325 MG tablet Take 2 tablets (650 mg total) by mouth every 4 (four) hours as needed for headache or mild pain.    Marland Kitchen albuterol (VENTOLIN HFA) 108 (90 Base) MCG/ACT inhaler Inhale 2 puffs into the lungs every 4 (four) hours as needed for wheezing or shortness of breath. 18 g 3  . allopurinol (ZYLOPRIM) 100 MG tablet TAKE 1 TABLET DAILY TO PREVENT GOUT (Patient taking differently: Take 100 mg by mouth daily. ) 90 tablet 3  . aspirin EC 81 MG EC tablet Take 1 tablet (81 mg total) by mouth daily.    Marland Kitchen atorvastatin (LIPITOR) 80 MG tablet TAKE 1 TABLET (80 MG TOTAL) BY MOUTH DAILY AT 6 PM. (Patient taking differently: Take 80 mg by mouth  every evening. ) 90 tablet 2  . buPROPion (WELLBUTRIN XL) 300 MG 24 hr tablet Take 1 tablet (300 mg total) by mouth daily.  Take 1 tablet Daily for Mood 90 tablet 1  . Cholecalciferol ([employer] VIT D 5000 HIGH-POTENCY) 5000 units capsule Take 5,000 Units by mouth 2 (two) times daily.     . Continuous Blood Gluc Receiver (FREESTYLE LIBRE 14 DAY READER) DEVI 1 Device by Does not apply route every 14 (fourteen) days. 2 each 4  . Continuous Blood Gluc Sensor (FREESTYLE LIBRE 14 DAY SENSOR) MISC Apply 1 application topically every 14 (fourteen) days. 2 each 4  . diclofenac sodium (VOLTAREN) 1 % GEL Apply 4 g topically 4 (four) times daily. (Patient taking differently: Apply 4 g topically daily as needed (For had pain). ) 100 g 3  . escitalopram (LEXAPRO) 20 MG tablet Take 1 tablet (20 mg total) by mouth daily. (Patient taking differently: Take 30 mg by mouth daily. ) 90 tablet 3  . furosemide (LASIX) 40 MG tablet Take 40 mg by mouth as needed for fluid.    Marland Kitchen gabapentin (NEURONTIN) 100 MG capsule Take 1 to 2 capsules 3 to 4 x /day as needed for Painful Diabetic Neuropathy (Patient taking differently: Take 200 mg by mouth in the morning, at noon, in the evening, and at bedtime. ) 360 capsule 3  . insulin aspart (NOVOLOG FLEXPEN) 100 UNIT/ML FlexPen Inject 50  to 75 units 3 x /day before meals as directed for Diabetes (Patient taking differently: Inject 20 Units into the skin 3 (three) times daily with meals. Inject 50  to 75 units 3 x /day before meals as directed for Diabetes) 30 mL 3  . Insulin Glargine (BASAGLAR KWIKPEN) 100 UNIT/ML SOPN Inject 70 units Daily for Diabetes (Patient taking differently: Inject 70 Units into the skin at bedtime. Inject 70 units Daily for Diabetes) 20 pen 3  . Insulin Pen Needle (BD PEN NEEDLE NANO U/F) 32G X 4 MM MISC USE AS DIRECTED 4 TIMES A DAY 400 each 3  . ketoconazole (NIZORAL) 2 % cream Apply 1 application topically 2 (two) times daily. 15 g 0  . Lancets (ONETOUCH ULTRASOFT)  lancets Use as instructed 100 each 12  . liraglutide (VICTOZA) 18 MG/3ML SOPN Inject 0.2 mLs (1.2 mg total) into the skin every morning. 6 mL 3  . losartan (COZAAR) 50 MG tablet Take 1 tablet Daily for BP & Diabetic Kidney Protection 90 tablet 3  . metoprolol succinate (TOPROL-XL) 25 MG 24 hr tablet Take 1 tablet (25 mg total) by mouth at bedtime. 90 tablet 3  . nitroGLYCERIN (NITROSTAT) 0.4 MG SL tablet Place 1 tablet (0.4 mg total) under the tongue every 5 (five) minutes x 3 doses as needed for chest pain. 25 tablet 4  . ONETOUCH VERIO test strip USE AS INSTRUCTED TO CHECK SUGAR 2 TIMES DAILY. 200 each 4  . pantoprazole (PROTONIX) 40 MG tablet Take 1 tablet (40 mg total) by mouth daily. 90 tablet 1  . Polyethylene Glycol 400 (BLINK TEARS OP) Place 1 drop into both eyes daily as needed.    Marland Kitchen rOPINIRole (REQUIP) 3 MG tablet Take 1 tablet 4 x /day for Restless Legs (Patient taking differently: Take 3 mg by mouth in the morning, at noon, in the evening, and at bedtime. ) 360 tablet 3  . tiZANidine (ZANAFLEX) 4 MG tablet Take 1 tablet at Bedtime as needed for Muscle Spasms 90 tablet 1  . vitamin B-12 (CYANOCOBALAMIN) 1000 MCG tablet Take 1,000 mcg by mouth daily.    . benzonatate (TESSALON) 200 MG capsule Take 1 capsule (200 mg total) by mouth 3 (three) times  daily as needed for cough (Max: 600mg  per day). (Patient not taking: Reported on 10/15/2019) 30 capsule 0  . ferrous sulfate 325 (65 FE) MG tablet Take 325 mg by mouth daily with breakfast.     No facility-administered medications prior to visit.         Objective:   Physical Exam Vitals:   10/15/19 1037  BP: 110/60  Pulse: 82  Temp: 98.9 F (37.2 C)  TempSrc: Oral  SpO2: 91%  Weight: 246 lb 12.8 oz (111.9 kg)  Height: 5\' 1"  (1.549 m)   Gen: Pleasant, obese, in no distress,  normal affect. Comfortable at rest  ENT: No lesions,  mouth clear,  oropharynx clear but crowded, M4 airway, no postnasal drip  Neck: No JVD, no  stridor  Lungs: No use of accessory muscles, small breaths, no crackles or wheezing on normal respiration, no wheeze on forced expiration  Cardiovascular: RRR, heart sounds normal, soft systolic murmur, trace bilateral pretibial peripheral edema  Musculoskeletal: No deformities, no cyanosis or clubbing  Neuro: alert, awake, non focal  Skin: Warm, no lesions or rash     Assessment & Plan:  OSA and COPD overlap syndrome (HCC) Suspect that she does have COPD, had symptoms consistent with an exacerbation in May.  She was started on Breztri at that time which should be a good medication for her.  She has albuterol that she can use as needed.  She needs pulmonary function test to quantify her degree of obstruction.  Her OSA is untreated, likely severe.  She needs a repeat PSG and I will arrange for this.  Then we will try to get CPAP started.  Solitary pulmonary nodule 6 mm left lower lobe nodule.  Discussed this with her.  She needs a repeat CT in October to follow for interval stability.  DOE (dyspnea on exertion) Suspect multifactorial.  Obesity and restriction are almost certainly contributors, also with evidence for COPD although we have quantified.  Suspect that deconditioning, her anemia (which is under evaluation) are also contributors.  Need to walk next time to rule out occult desaturation.  She may benefit from supplemental oxygen.  Agree with her current bronchodilator regimen pending PFTs.  She may benefit going forward from cardiopulmonary rehab  Baltazar Apo, MD, PhD 10/15/2019, 11:10 AM [facility] [phone] or if no answer [phone]

## 2019-10-15 NOTE — Assessment & Plan Note (Signed)
6 mm left lower lobe nodule.  Discussed this with her.  She needs a repeat CT in October to follow for interval stability.

## 2019-10-15 NOTE — Assessment & Plan Note (Signed)
Suspect that she does have COPD, had symptoms consistent with an exacerbation in May.  She was started on Breztri at that time which should be a good medication for her.  She has albuterol that she can use as needed.  She needs pulmonary function test to quantify her degree of obstruction.  Her OSA is untreated, likely severe.  She needs a repeat PSG and I will arrange for this.  Then we will try to get CPAP started.

## 2019-10-15 NOTE — Addendum Note (Signed)
Addended by: Gavin Potters R on: 10/15/2019 12:04 PM   Modules accepted: Orders

## 2019-10-15 NOTE — Patient Instructions (Addendum)
INCREASE THE PROTONIX TO TWICE A DAY FOR POSSIBLE GI BLEED  GET BACK ON IRON SUPPLEMENTS can try two a day WITH A VITAMIN C  WE WILL CHECK YOUR CBC AND CMET   Chronic Pancreatitis  Chronic pancreatitis is long-lasting inflammation and scarring of the pancreas. The pancreas is a gland that is located behind the stomach. It makes enzymes that help to digest food. The pancreas also releases hormones called glucagon and insulin, which help regulate blood sugar (glucose). Damage to the pancreas may affect digestion, cause pain in the upper abdomen and back, and cause diabetes. Inflammation can also irritate other organs in the abdomen near the pancreas. At first, pancreatitis may be sudden (acute). If you have several or prolonged episodes of acute pancreatitis, the condition can turn into chronic pancreatitis. What are the causes? The most common cause of this condition is alcohol abuse. Other causes include:  High (elevated) levels of triglycerides in the blood (hypertriglyceridemia).  Gallstones or other conditions that can block the tube that drains the pancreas (pancreatic duct).  Pancreatic cancer.  Cystic fibrosis.  Too much calcium in the blood (hypercalcemia), which may be caused by an overactive parathyroid gland (hyperparathyroidism).  Certain medicines.  Injury to the pancreas.  Infection.  Autoimmune pancreatitis. This is when the body's disease-fighting (immune) system attacks the pancreas.  Genes that are passed from parent to child (inherited). In some cases, the cause may not be known. What increases the risk? This condition is more likely to develop in:  Men.  People who are 50-60 years old.  People who have a family history of pancreatitis.  People who smoke tobacco.  People who drink large amounts of alcohol over a long period of time. What are the signs or symptoms? Symptoms of this condition may include:  Pain in the abdomen or upper back. Pain may  get worse after eating.  Nausea and vomiting.  Fever.  Weight loss.  A change in the color and consistency of bowel movements, such as stools that are oily, fatty, or clay-colored. How is this diagnosed? This condition is diagnosed based on your symptoms, your medical history, and a physical exam. You may have tests, such as:  Blood tests.  Stool samples.  Biopsy of the pancreas. This is the removal of a small amount of pancreas tissue to be tested in a lab.  Imaging tests, such as: ? X-rays. ? CT scan. ? MRI. ? Ultrasound. How is this treated? You may need to be treated at a hospital. Treatment may involve:  Resting the pancreas. You may need to stop eating and drinking for a few days to give your pancreas time to recover. During this time, you will be given IV fluids to keep you hydrated.  Controlling pain. You may be given pain medicines by mouth (orally) or as injections.  Improving digestion. You may be given: ? Medicines to replace your pancreatic enzymes. ? Vitamin supplements. ? A specific diet to follow. You may work with a diet and nutrition specialist (dietitian) to make an eating plan.  Surgery to: ? Clear the pancreatic ducts of any blockages, such as gallstones. ? Remove any fluid or damaged tissue from the pancreas. Other treatments may include:  Preventing diabetes. Your health care provider may recommend that you: ? Get regular screening tests for diabetes. ? Monitor your blood glucose regularly.  Lifestyle changes, such as stopping alcohol use.  Steroid medicines, if your condition is caused by your immune system attacking your body's own  tissues (autoimmune disease). Follow these instructions at home: Eating and drinking      Do not drink alcohol. If you need help quitting, ask your health care provider.  Follow a diet as told by your health care provider or dietitian, if this applies. This may include: ? Limiting how much fat you  eat. ? Eating smaller meals more often. ? Avoiding caffeine.  Drink enough fluid to keep your urine pale yellow. General instructions  Take over-the-counter and prescription medicines only as told by your health care provider. These include vitamin supplements.  Do not drive or use heavy machinery while taking prescription pain medicine.  If you are taking prescription pain medicine, take actions to prevent or treat constipation. Your health care provider may recommend that you: ? Take an over-the-counter or prescription medicine for constipation. ? Eat foods that are high in fiber such as whole grains and beans. ? Limit foods that are high in fat and processed sugars, such as fried or sweet foods.  Do not use any products that contain nicotine or tobacco, such as cigarettes and e-cigarettes. If you need help quitting, ask your health care provider.  If recommended by your health care provider, monitor your blood glucose at home.  Keep all follow-up visits as told by your health care provider. This is important. Contact a health care provider if:  You have pain that does not get better with medicine.  You have a fever.  You have sudden weight loss. Get help right away if:  Your pain suddenly gets worse.  You have sudden swelling in your abdomen.  You start to vomit often.  You vomit blood.  You have diarrhea that does not go away.  You have blood in your stool.  You become confused or you have trouble thinking clearly. Summary  Chronic pancreatitis is long-lasting inflammation and scarring of the pancreas. Damage to the pancreas may affect digestion, cause pain in the upper abdomen and back, and cause diabetes. Inflammation can also irritate other organs in the abdomen near the pancreas.  Common causes of this condition are alcohol abuse, gallstones, high (elevated) levels of triglycerides, and certain medicines.  This condition is sometimes treated at a hospital and  may involve resting the pancreas, controlling pain, replacing enzymes, and avoiding alcohol. This information is not intended to replace advice given to you by your health care provider. Make sure you discuss any questions you have with your health care provider. Document Revised: 11/30/2018 Document Reviewed: 12/24/2016 Elsevier Patient Education  [place].

## 2019-10-15 NOTE — Assessment & Plan Note (Signed)
Suspect multifactorial.  Obesity and restriction are almost certainly contributors, also with evidence for COPD although we have quantified.  Suspect that deconditioning, her anemia (which is under evaluation) are also contributors.  Need to walk next time to rule out occult desaturation.  She may benefit from supplemental oxygen.  Agree with her current bronchodilator regimen pending PFTs.  She may benefit going forward from cardiopulmonary rehab

## 2019-10-16 ENCOUNTER — Telehealth: Payer: Self-pay | Admitting: Gastroenterology

## 2019-10-16 NOTE — Telephone Encounter (Signed)
PCP requesting pt be seen for anemia. Pt scheduled to see Alonza Bogus PA 10/25/19@1 :30pm. PCP office to notify pt of appt. Spoke with Katrina.

## 2019-10-16 NOTE — Telephone Encounter (Signed)
Patient recently had labs done and they keep going up in levels. She is also seeing pulmonary and oncology. Please contact Estill Bamberg or Nurse, [facility].

## 2019-10-17 ENCOUNTER — Ambulatory Visit
Admission: RE | Admit: 2019-10-17 | Discharge: 2019-10-17 | Disposition: A | Discharge status: Home / Self Care | Payer: PPO | Source: Ambulatory Visit | Attending: Physician Assistant | Admitting: Physician Assistant

## 2019-10-17 DIAGNOSIS — R109 Unspecified abdominal pain: Secondary | ICD-10-CM

## 2019-10-17 DIAGNOSIS — K8020 Calculus of gallbladder without cholecystitis without obstruction: Secondary | ICD-10-CM | POA: Diagnosis not present

## 2019-10-17 NOTE — Progress Notes (Signed)
HEMATOLOGY/ONCOLOGY CONSULTATION NOTE  Date of Service: 10/18/2019  Patient Care Team: [name], MD as PCP - General (Internal Medicine) Skeet Latch, MD as PCP - Cardiology (Cardiology) Marygrace Drought, MD as Consulting Physician (Ophthalmology) Larey Dresser, MD as Consulting Physician (Cardiology) Nobie Putnam, MD as Consulting Physician (Oncology) Tania Ade, MD as Consulting Physician (Orthopedic Surgery) Danis, Kirke Corin, MD as Consulting Physician (Gastroenterology)  REFERRING PHYSICIAN: [name], MD  CHIEF COMPLAINTS/PURPOSE OF CONSULTATION:  pancytopenia  HISTORY OF PRESENTING ILLNESS:   Janet Choi is a wonderful 79 y.o. female who has been referred to [place] by [name], MD for evaluation and management of pancytopenia. Pt is accompanied today by her sister, Janet Choi. The pt reports that she is doing well overall.   The pt reports she is good. She has acute inflammation in the gallbladder and it bothers her when she eats. Pt has chronic diarrhea and has been diagnosed with sleep apnea. She was given a sleep apnea machine about 7 years ago but did not use it. Pt's diabetes has not been well controlled recently. She is also on aspirin since her heart attack. Pt has been on Requip for several years now. She also has been on gabapentin for neuropathy for several years. Pt has starting taking iron supplement this week. She was recently placed on protonix and taken off of 154m of allopurinol. There is no liver or thyroid problems that she knows of.   She felt really bad after the second dose of the COVID19 vaccine and stayed in for 14 days back in April. She was really sick and her arm was red and swollen where the shot was administered. At the beginning of the year the pt had cellulitis and was on antibiotics for more than 3 weeks.   Pt has never had blood transfusions or IV iron. She has not had a gout attack in several years.   Of note prior  to the patient's visit today, pt has had [place]domen Complete ([id number] completed on 10/17/19 with results revealing "Cholelithiasis with positive sonographic Murphy's sign and mild wall thickening these changes would be consistent with acute cholecystitis in the appropriate clinical setting. HIDA scan may be helpful for further evaluation. Left renal calculi without obstructive changes. Left renal cyst."  Most recent lab results (10/15/19) of CBC is as follows: all values are WNL except for WBC at 2.5K, RBC at 2.65, Hemoglobin at 8.7, HCT at 26.5, Neutro Abs at 1353, Absolute Monocytes at 133, Glucose at 107, Creat at 1.30, GFR, Est Non Af Am at 39, GFR, Est AFR Am at 46, Total Protein at 5.8, AST at 8  On review of systems, pt reports SOB, chronic diarrhea, sleep apnea, fatigue, abdominal tenderness and denies infections, black/blood in stool, fever, chills, night sweats, unexpected weight loss, mouth soars, and any other symptoms.   On PMHx the pt reports chronic diarrhea, sleep apnea, diabetes, myocardial infarction, Vitamin B12 deficient, neuropathy, cellulitis  On Social Hx the pt reports no alcohol use   MEDICAL HISTORY:  Past Medical History:  Diagnosis Date  . Anemia    hx (04/14/2016)  . Anxiety   . Arthritis    "severe in my back; hands; ankles" (04/14/2016)  . Atypical chest pain 01/24/2018   Midline pain absent supine "constant" daytime since 12/31/17 > resolved as of 02/20/2018 on gerd/ gas diet   . Basal cell carcinoma    "several burned off; one cut off"  . CAD in  native artery    a. NSTEMI 04/2016 - s/p DES toLAD and LCx. PCI to LCx notable for microembolization during cath.  . Chest pain- reslved with stopping Brilinta now on Plaix 04/16/2016  . Chronic lower back pain   . CKD (chronic kidney disease), stage III   . Depression   . DJD (degenerative joint disease)   . Family history of adverse reaction to anesthesia    "half-sister used to get real sick" (04/14/2016)  .  GERD (gastroesophageal reflux disease)   . History of gout   . Hyperlipidemia   . Hypertension   . IBS (irritable bowel syndrome)   . Ischemic cardiomyopathy    a. 04/2016: EF 40-50% by cath, 50-55% +WMA by echo.  . 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 ([place]) ~ 2015  . Morbid obesity ([place])   . NSTEMI (non-ST elevated myocardial infarction) ([place]) 04/14/2016  . OSA on CPAP   . Peripheral neuropathy   . Peripheral vascular disease ([place])   . RLS (restless legs syndrome)   . Spinal stenosis   . Type II diabetes mellitus ([place])   . Vitamin D deficiency      SURGICAL HISTORY: Past Surgical History:  Procedure Laterality Date  . APPENDECTOMY    . BACK SURGERY    . BASAL CELL CARCINOMA EXCISION Left    leg  . CARDIAC CATHETERIZATION N/A 04/15/2016   Procedure: Left Heart Cath and Coronary Angiography;  Surgeon: Belva Crome, MD;  Location: [facility];  Service: Cardiovascular;  Laterality: N/A;  . CARDIAC CATHETERIZATION N/A 04/15/2016   Procedure: Coronary Stent Intervention;  Surgeon: Belva Crome, MD;  Location: [facility];  Service: Cardiovascular;  Laterality: N/A;  Mid LAD Mid CFX  . CATARACT EXTRACTION, BILATERAL Bilateral   . JOINT REPLACEMENT    . LUMBAR DISC SURGERY  X 2  . MELANOMA EXCISION Left    "towards the back of my neck"  . SHOULDER ARTHROSCOPY W/ ROTATOR CUFF REPAIR Left   . SHOULDER OPEN ROTATOR CUFF REPAIR Right   . TOTAL KNEE ARTHROPLASTY Bilateral      SOCIAL HISTORY: Social History   Socioeconomic History  . Marital status: Widowed    Spouse name: Not on file  . Number of children: Not on file  . Years of education: Not on file  . Highest education level: Not on file  Occupational History  . Not on file  Tobacco Use  . Smoking status: Former Smoker    Packs/day: 2.50    Years: 25.00    Pack years: 62.50    Types: Cigarettes    Quit date: 11/11/1983    Years since quitting: 35.9  . Smokeless tobacco: Former Network engineer    . Vaping Use: Never used  Substance and Sexual Activity  . Alcohol use: No    Alcohol/week: 0.0 standard drinks  . Drug use: No  . Sexual activity: Never  Other Topics Concern  . Not on file  Social History Narrative  . Not on file   Social Determinants of Health   Financial Resource Strain:   . Difficulty of Paying Living Expenses:   Food Insecurity:   . Worried About Charity fundraiser in the Last Year:   . Arboriculturist in the Last Year:   Transportation Needs:   . Film/video editor (Medical):   Marland Kitchen Lack of Transportation (Non-Medical):   Physical Activity:   . Days of Exercise per Week:   .  Minutes of Exercise per Session:   Stress:   . Feeling of Stress :   Social Connections:   . Frequency of Communication with Friends and Family:   . Frequency of Social Gatherings with Friends and Family:   . Attends Religious Services:   . Active Member of Clubs or Organizations:   . Attends Archivist Meetings:   Marland Kitchen Marital Status:   Intimate Partner Violence:   . Fear of Current or Ex-Partner:   . Emotionally Abused:   Marland Kitchen Physically Abused:   . Sexually Abused:      FAMILY HISTORY: Family History  Problem Relation Age of Onset  . Heart disease Mother   . Kidney disease Father   . AAA (abdominal aortic aneurysm) Father   . Heart disease Son   . Colon cancer Neg Hx   . Colon polyps Neg Hx   . Esophageal cancer Neg Hx   . Pancreatic cancer Neg Hx   . Stomach cancer Neg Hx   . Liver disease Neg Hx   . Diabetes Neg Hx      ALLERGIES:   is allergic to brilinta [ticagrelor], aspirin, nsaids, minocycline hcl, oruvail [ketoprofen], tricor [fenofibrate], vasotec [enalaprilat], and zinc.   MEDICATIONS:  Current Outpatient Medications  Medication Sig Dispense Refill  . acetaminophen (TYLENOL) 325 MG tablet Take 2 tablets (650 mg total) by mouth every 4 (four) hours as needed for headache or mild pain.    Marland Kitchen albuterol (VENTOLIN HFA) 108 (90 Base) MCG/ACT  inhaler Inhale 2 puffs into the lungs every 4 (four) hours as needed for wheezing or shortness of breath. 18 g 3  . aspirin EC 81 MG EC tablet Take 1 tablet (81 mg total) by mouth daily.    Marland Kitchen atorvastatin (LIPITOR) 80 MG tablet TAKE 1 TABLET (80 MG TOTAL) BY MOUTH DAILY AT 6 PM. (Patient taking differently: Take 80 mg by mouth every evening. ) 90 tablet 2  . benzonatate (TESSALON) 200 MG capsule Take 1 capsule (200 mg total) by mouth 3 (three) times daily as needed for cough (Max: 616m per day). 30 capsule 0  . Budeson-Glycopyrrol-Formoterol (BREZTRI AEROSPHERE) 160-9-4.8 MCG/ACT AERO Inhale 2 puffs into the lungs in the morning and at bedtime. 5.9 g 2  . buPROPion (WELLBUTRIN XL) 300 MG 24 hr tablet Take 1 tablet (300 mg total) by mouth daily. Take 1 tablet Daily for Mood 90 tablet 1  . Cholecalciferol ([employer] VIT D 5000 HIGH-POTENCY) 5000 units capsule Take 5,000 Units by mouth 2 (two) times daily.     . Continuous Blood Gluc Receiver (FREESTYLE LIBRE 14 DAY READER) DEVI 1 Device by Does not apply route every 14 (fourteen) days. 2 each 4  . Continuous Blood Gluc Sensor (FREESTYLE LIBRE 14 DAY SENSOR) MISC Apply 1 application topically every 14 (fourteen) days. 2 each 4  . diclofenac sodium (VOLTAREN) 1 % GEL Apply 4 g topically 4 (four) times daily. (Patient taking differently: Apply 4 g topically daily as needed (For had pain). ) 100 g 3  . escitalopram (LEXAPRO) 20 MG tablet Take 1 tablet (20 mg total) by mouth daily. (Patient taking differently: Take 30 mg by mouth daily. ) 90 tablet 3  . ferrous sulfate 325 (65 FE) MG tablet Take 325 mg by mouth daily with breakfast.    . furosemide (LASIX) 40 MG tablet Take 40 mg by mouth as needed for fluid.    .Marland Kitchengabapentin (NEURONTIN) 100 MG capsule Take 1 to 2 capsules 3 to 4  x /day as needed for Painful Diabetic Neuropathy (Patient taking differently: Take 200 mg by mouth in the morning, at noon, in the evening, and at bedtime. ) 360 capsule 3  . insulin  aspart (NOVOLOG FLEXPEN) 100 UNIT/ML FlexPen Inject 50  to 75 units 3 x /day before meals as directed for Diabetes (Patient taking differently: Inject 20 Units into the skin 3 (three) times daily with meals. Inject 50  to 75 units 3 x /day before meals as directed for Diabetes) 30 mL 3  . Insulin Glargine (BASAGLAR KWIKPEN) 100 UNIT/ML SOPN Inject 70 units Daily for Diabetes (Patient taking differently: Inject 70 Units into the skin at bedtime. Inject 70 units Daily for Diabetes) 20 pen 3  . Insulin Pen Needle (BD PEN NEEDLE NANO U/F) 32G X 4 MM MISC USE AS DIRECTED 4 TIMES A DAY 400 each 3  . ketoconazole (NIZORAL) 2 % cream Apply 1 application topically 2 (two) times daily. 15 g 0  . Lancets (ONETOUCH ULTRASOFT) lancets Use as instructed 100 each 12  . liraglutide (VICTOZA) 18 MG/3ML SOPN Inject 0.2 mLs (1.2 mg total) into the skin every morning. 6 mL 3  . losartan (COZAAR) 50 MG tablet Take 1 tablet Daily for BP & Diabetic Kidney Protection 90 tablet 3  . metoprolol succinate (TOPROL-XL) 25 MG 24 hr tablet Take 1 tablet (25 mg total) by mouth at bedtime. 90 tablet 3  . nitroGLYCERIN (NITROSTAT) 0.4 MG SL tablet Place 1 tablet (0.4 mg total) under the tongue every 5 (five) minutes x 3 doses as needed for chest pain. 25 tablet 4  . ONETOUCH VERIO test strip USE AS INSTRUCTED TO CHECK SUGAR 2 TIMES DAILY. 200 each 4  . pantoprazole (PROTONIX) 40 MG tablet Take 1 tablet (40 mg total) by mouth 2 (two) times daily before a meal. 180 tablet 1  . Polyethylene Glycol 400 (BLINK TEARS OP) Place 1 drop into both eyes daily as needed.    Marland Kitchen rOPINIRole (REQUIP) 3 MG tablet Take 1 tablet 4 x /day for Restless Legs (Patient taking differently: Take 3 mg by mouth in the morning, at noon, in the evening, and at bedtime. ) 360 tablet 3  . tiZANidine (ZANAFLEX) 4 MG tablet Take 1 tablet at Bedtime as needed for Muscle Spasms 90 tablet 1  . vitamin B-12 (CYANOCOBALAMIN) 1000 MCG tablet Take 1,000 mcg by mouth daily.       No current facility-administered medications for this visit.   REVIEW OF SYSTEMS:   A 10+ POINT REVIEW OF SYSTEMS WAS OBTAINED including neurology, dermatology, psychiatry, cardiac, respiratory, lymph, extremities, GI, GU, Musculoskeletal, constitutional, breasts, reproductive, HEENT.  All pertinent positives are noted in the HPI.  All others are negative.   PHYSICAL EXAMINATION: ECOG PERFORMANCE STATUS: 2 - Symptomatic, <50% confined to bed  Vitals:   10/18/19 1139  BP: (!) 146/55  Pulse: 69  Resp: 18  Temp: 98.8 F (37.1 C)  SpO2: 95%   Filed Weights   10/18/19 1139  Weight: 246 lb 11.2 oz (111.9 kg)   Body mass index is 46.61 kg/m.  GENERAL:alert, in no acute distress and comfortable SKIN: no acute rashes, no significant lesions EYES: conjunctiva are pink and non-injected, sclera anicteric OROPHARYNX: MMM, no exudates, no oropharyngeal erythema or ulceration NECK: supple, no JVD LYMPH:  no palpable lymphadenopathy in the cervical, axillary or inguinal regions LUNGS: clear to auscultation b/l with normal respiratory effort HEART: regular rate & rhythm ABDOMEN:  normoactive bowel sounds , non tender,  not distended. Extremity: no pedal edema PSYCH: alert & oriented x 3 with fluent speech NEURO: no focal motor/sensory deficits  LABORATORY DATA:  I have reviewed the data as listed  CBC Latest Ref Rng & Units 10/15/2019 10/11/2019 10/09/2019  WBC 3.8 - 10.8 Thousand/uL 2.5(L) 2.0(L) 2.1(L)  Hemoglobin 11.7 - 15.5 g/dL 8.7(L) 8.8(L) 9.2(L)  Hematocrit 35 - 45 % 26.5(L) 27.0(L) 28.1(L)  Platelets 140 - 400 Thousand/uL 140 113(L) 120(L)   . CBC    Component Value Date/Time   WBC 2.5 (L) 10/18/2019 1232   RBC 2.80 (L) 10/18/2019 1232   HGB 9.0 (L) 10/18/2019 1232   HGB 9.1 (L) 05/15/2009 1032   HCT 28.2 (L) 10/18/2019 1233   HCT 29.7 (L) 10/18/2019 1232   HCT 31.6 (L) 05/15/2009 1032   PLT 140 (L) 10/18/2019 1232   PLT 243 05/15/2009 1032   MCV 106.1 (H) 10/18/2019  1232   MCV 78.8 (L) 05/15/2009 1032   MCH 32.1 10/18/2019 1232   MCHC 30.3 10/18/2019 1232   RDW 15.2 10/18/2019 1232   RDW 17.2 (H) 05/15/2009 1032   LYMPHSABS 0.7 10/18/2019 1232   LYMPHSABS 1.2 05/15/2009 1032   MONOABS 0.1 10/18/2019 1232   MONOABS 0.3 05/15/2009 1032   EOSABS 0.1 10/18/2019 1232   EOSABS 0.1 05/15/2009 1032   BASOSABS 0.0 10/18/2019 1232   BASOSABS 0.0 05/15/2009 1032     CMP Latest Ref Rng & Units 10/15/2019 10/09/2019 10/09/2019  Glucose 65 - 99 mg/dL 107(H) 285(H) -  BUN 7 - 25 mg/dL 23 20 -  Creatinine 0.60 - 0.93 mg/dL 1.30(H) 1.26(H) -  Sodium 135 - 146 mmol/L 141 143 -  Potassium 3.5 - 5.3 mmol/L 4.6 4.8 -  Chloride 98 - 110 mmol/L 106 108 -  CO2 20 - 32 mmol/L 28 27 -  Calcium 8.6 - 10.4 mg/dL 9.1 8.7 -  Total Protein 6.1 - 8.1 g/dL 5.8(L) 5.8(L) 5.7(L)  Total Bilirubin 0.2 - 1.2 mg/dL 0.5 0.5 -  Alkaline Phos 38 - 126 U/L - - -  AST 10 - 35 U/L 8(L) 7(L) -  ALT 6 - 29 U/L 6 7 -    RADIOGRAPHIC STUDIES: I have personally reviewed the radiological images as listed and agreed with the findings in the report. US Abdomen Complete  Result Date: 10/18/2019 CLINICAL DATA:  Right upper quadrant pain EXAM: ABDOMEN ULTRASOUND COMPLETE COMPARISON:  CT from 04/22/2016 FINDINGS: Gallbladder: Gallbladder is partially distended with mild wall thickening likely related to incomplete distension. Cholelithiasis is identified. Positive sonographic Murphy's sign is noted. Common bile duct: Diameter: 5.5 mm. Liver: Mild heterogeneity without focal mass. Portal vein is patent on color Doppler imaging with normal direction of blood flow towards the liver. IVC: No abnormality visualized. Pancreas: Visualized portion unremarkable. Spleen: Size and appearance within normal limits. Right Kidney: Length: 9.9 cm. Echogenicity within normal limits. No mass or hydronephrosis visualized. Left Kidney: Length: 9.6 cm. No mass lesion or hydronephrosis is noted. 2.6 cm cyst is noted in the  lower pole. There are scattered echogenicities consistent with nonobstructing stones. Abdominal aorta: No aneurysm visualized. Other findings: None. IMPRESSION: Cholelithiasis with positive sonographic Murphy's sign and mild wall thickening these changes would be consistent with acute cholecystitis in the appropriate clinical setting. HIDA scan may be helpful for further evaluation. Left renal calculi without obstructive changes. Left renal cyst. Electronically Signed   By: Inez Catalina M.D.   On: 10/18/2019 08:19     ASSESSMENT & PLAN:   Janet Laster  AVEREY Choi is a 79 y.o. female with:  1. Macrocytic Anemia 2. 2. Thrombocytopenia 3. Leucopenia/Neutropenia   PLAN: -Discussed patient's most recent labs from 10/15/19, of CBC is as follows: all values are WNL except for WBC at 2.5K, RBC at 2.65, Hemoglobin at 8.7, HCT at 26.5, Neutro Abs at 1353, Absolute Monocytes at 133, Glucose at 107, Creat at 1.30, GFR, Est Non Af Am at 39, GFR, Est AFR Am at 46, Total Protein at 5.8, AST at 8 -Discussed 10/17/19 of  US Abdomen Complete ([id number]) -- concern for cholecystitis -Advised unlikely bone marrow problem -Advised on RBC and WBC are low  -Advised acute cholecystitis -continue to f/u with PCP -Advised on sleep apnea -continue with sleep study as pt is scheduled  -Advised on Requip medication and sleep apnea  -Advised regarding RBC macrocytosis and possible etiologies -Advised keeping iron levels higher (ferritin>100) -Advised on multi-factor involvement  -Recommends staying hydrated  -Recommends continue Vitamin B12, Vitamin D and iron supplements  -Continue f/u with GI  -Continue f/u with PCP -Will get labs today  -Will see back in 2 week via phone visit   [place] today Phone visit with Dr [name] in 2 weeks  . Orders Placed This Encounter  Procedures  . CBC with Differential/Platelet    Standing Status:   Future    Number of Occurrences:   1    Standing Expiration Date:   10/17/2020    . CMP ([facility] only)    Standing Status:   Future    Number of Occurrences:   1    Standing Expiration Date:   10/17/2020  . Lactate dehydrogenase    Standing Status:   Future    Number of Occurrences:   1    Standing Expiration Date:   10/17/2020  . Haptoglobin    Standing Status:   Future    Number of Occurrences:   1    Standing Expiration Date:   10/17/2020  . Folate RBC    Standing Status:   Future    Number of Occurrences:   1    Standing Expiration Date:   10/17/2020  . Vitamin B12    Standing Status:   Future    Number of Occurrences:   1    Standing Expiration Date:   10/17/2020  . Multiple Myeloma Panel (SPEP&IFE w/QIG)    Standing Status:   Future    Number of Occurrences:   1    Standing Expiration Date:   10/17/2020  . Kappa/lambda light chains    Standing Status:   Future    Number of Occurrences:   1    Standing Expiration Date:   10/17/2020  . Copper, serum    Standing Status:   Future    Number of Occurrences:   1    Standing Expiration Date:   10/17/2020  . TSH    Standing Status:   Future    Number of Occurrences:   1    Standing Expiration Date:   10/17/2020  . Sedimentation rate    Standing Status:   Future    Number of Occurrences:   1    Standing Expiration Date:   10/17/2020  . C-reactive protein    Standing Status:   Future    Number of Occurrences:   1    Standing Expiration Date:   10/17/2020    The total time spent in the appt was 60 minutes and more than 50% was  on counseling and direct patient cares.  All of the patient's questions were answered with apparent satisfaction. The patient knows to call the clinic with any problems, questions or concerns.   Sullivan Lone MD [place] AAHIVMS [facility] Hematology/Oncology Physician [facility]  (Office):       [phone] (Work cell):  [phone] (Fax):           [phone]  10/18/2019 12:31 PM  I, Dawayne Cirri am acting as a Education administrator for Dr. Sullivan Lone.   .I have reviewed the  above documentation for accuracy and completeness, and I agree with the above. Brunetta Genera MD

## 2019-10-18 ENCOUNTER — Inpatient Hospital Stay: Payer: PPO | Attending: Hematology | Admitting: Hematology

## 2019-10-18 ENCOUNTER — Other Ambulatory Visit: Payer: Self-pay

## 2019-10-18 ENCOUNTER — Inpatient Hospital Stay: Payer: PPO

## 2019-10-18 VITALS — BP 146/55 | HR 69 | Temp 98.8°F | Resp 18 | Ht 61.0 in | Wt 246.7 lb

## 2019-10-18 DIAGNOSIS — D696 Thrombocytopenia, unspecified: Secondary | ICD-10-CM | POA: Diagnosis not present

## 2019-10-18 DIAGNOSIS — Z79899 Other long term (current) drug therapy: Secondary | ICD-10-CM | POA: Diagnosis not present

## 2019-10-18 DIAGNOSIS — D709 Neutropenia, unspecified: Secondary | ICD-10-CM

## 2019-10-18 DIAGNOSIS — Z87891 Personal history of nicotine dependence: Secondary | ICD-10-CM | POA: Insufficient documentation

## 2019-10-18 DIAGNOSIS — D649 Anemia, unspecified: Secondary | ICD-10-CM

## 2019-10-18 DIAGNOSIS — D509 Iron deficiency anemia, unspecified: Secondary | ICD-10-CM

## 2019-10-18 LAB — CBC WITH DIFFERENTIAL/PLATELET
Abs Immature Granulocytes: 0.01 10*3/uL (ref 0.00–0.07)
Basophils Absolute: 0 10*3/uL (ref 0.0–0.1)
Basophils Relative: 0 %
Eosinophils Absolute: 0.1 10*3/uL (ref 0.0–0.5)
Eosinophils Relative: 3 %
HCT: 29.7 % — ABNORMAL LOW (ref 36.0–46.0)
Hemoglobin: 9 g/dL — ABNORMAL LOW (ref 12.0–15.0)
Immature Granulocytes: 0 %
Lymphocytes Relative: 30 %
Lymphs Abs: 0.7 10*3/uL (ref 0.7–4.0)
MCH: 32.1 pg (ref 26.0–34.0)
MCHC: 30.3 g/dL (ref 30.0–36.0)
MCV: 106.1 fL — ABNORMAL HIGH (ref 80.0–100.0)
Monocytes Absolute: 0.1 10*3/uL (ref 0.1–1.0)
Monocytes Relative: 4 %
Neutro Abs: 1.5 10*3/uL — ABNORMAL LOW (ref 1.7–7.7)
Neutrophils Relative %: 63 %
Platelets: 140 10*3/uL — ABNORMAL LOW (ref 150–400)
RBC: 2.8 MIL/uL — ABNORMAL LOW (ref 3.87–5.11)
RDW: 15.2 % (ref 11.5–15.5)
WBC: 2.5 10*3/uL — ABNORMAL LOW (ref 4.0–10.5)
nRBC: 0 % (ref 0.0–0.2)

## 2019-10-18 LAB — SEDIMENTATION RATE: Sed Rate: 52 mm/hr — ABNORMAL HIGH (ref 0–22)

## 2019-10-18 LAB — CMP (CANCER CENTER ONLY)
ALT: 8 U/L (ref 0–44)
AST: 9 U/L — ABNORMAL LOW (ref 15–41)
Albumin: 3.5 g/dL (ref 3.5–5.0)
Alkaline Phosphatase: 90 U/L (ref 38–126)
Anion gap: 10 (ref 5–15)
BUN: 21 mg/dL (ref 8–23)
CO2: 20 mmol/L — ABNORMAL LOW (ref 22–32)
Calcium: 8.7 mg/dL — ABNORMAL LOW (ref 8.9–10.3)
Chloride: 109 mmol/L (ref 98–111)
Creatinine: 1.39 mg/dL — ABNORMAL HIGH (ref 0.44–1.00)
GFR, Est AFR Am: 42 mL/min — ABNORMAL LOW (ref >60)
GFR, Estimated: 36 mL/min — ABNORMAL LOW (ref >60)
Glucose, Bld: 279 mg/dL — ABNORMAL HIGH (ref 70–99)
Potassium: 4.2 mmol/L (ref 3.5–5.1)
Sodium: 139 mmol/L (ref 135–145)
Total Bilirubin: 0.4 mg/dL (ref 0.3–1.2)
Total Protein: 6.3 g/dL — ABNORMAL LOW (ref 6.5–8.1)

## 2019-10-18 LAB — LACTATE DEHYDROGENASE: LDH: 172 U/L (ref 98–192)

## 2019-10-18 LAB — VITAMIN B12: Vitamin B-12: 523 pg/mL (ref 180–914)

## 2019-10-18 LAB — TSH: TSH: 1.257 u[IU]/mL (ref 0.308–3.960)

## 2019-10-18 LAB — C-REACTIVE PROTEIN: CRP: 0.9 mg/dL (ref <1.0)

## 2019-10-18 NOTE — Addendum Note (Signed)
Addended by: Vladimir Crofts on: 10/18/2019 08:47 AM   Modules accepted: Orders

## 2019-10-19 LAB — FOLATE RBC
Folate, Hemolysate: 363 ng/mL
Folate, RBC: 1287 ng/mL (ref >498)
Hematocrit: 28.2 % — ABNORMAL LOW (ref 34.0–46.6)

## 2019-10-19 LAB — KAPPA/LAMBDA LIGHT CHAINS
Kappa free light chain: 31.1 mg/L — ABNORMAL HIGH (ref 3.3–19.4)
Kappa, lambda light chain ratio: 1.4 (ref 0.26–1.65)
Lambda free light chains: 22.2 mg/L (ref 5.7–26.3)

## 2019-10-19 LAB — HAPTOGLOBIN: Haptoglobin: 155 mg/dL (ref 42–346)

## 2019-10-20 LAB — COPPER, SERUM: Copper: 142 ug/dL (ref 80–158)

## 2019-10-22 ENCOUNTER — Encounter: Payer: Self-pay | Admitting: Internal Medicine

## 2019-10-22 LAB — MULTIPLE MYELOMA PANEL, SERUM
Albumin SerPl Elph-Mcnc: 3.3 g/dL (ref 2.9–4.4)
Albumin/Glob SerPl: 1.3 (ref 0.7–1.7)
Alpha 1: 0.2 g/dL (ref 0.0–0.4)
Alpha2 Glob SerPl Elph-Mcnc: 0.9 g/dL (ref 0.4–1.0)
B-Globulin SerPl Elph-Mcnc: 1 g/dL (ref 0.7–1.3)
Gamma Glob SerPl Elph-Mcnc: 0.4 g/dL (ref 0.4–1.8)
Globulin, Total: 2.6 g/dL (ref 2.2–3.9)
IgA: 126 mg/dL (ref 64–422)
IgG (Immunoglobin G), Serum: 396 mg/dL — ABNORMAL LOW (ref 586–1602)
IgM (Immunoglobulin M), Srm: 48 mg/dL (ref 26–217)
Total Protein ELP: 5.9 g/dL — ABNORMAL LOW (ref 6.0–8.5)

## 2019-10-25 ENCOUNTER — Other Ambulatory Visit: Payer: PPO

## 2019-10-25 ENCOUNTER — Encounter: Payer: Self-pay | Admitting: Gastroenterology

## 2019-10-25 ENCOUNTER — Ambulatory Visit (INDEPENDENT_AMBULATORY_CARE_PROVIDER_SITE_OTHER): Payer: PPO | Admitting: Gastroenterology

## 2019-10-25 VITALS — BP 100/58 | HR 80 | Ht 61.5 in | Wt 243.0 lb

## 2019-10-25 DIAGNOSIS — Z794 Long term (current) use of insulin: Secondary | ICD-10-CM

## 2019-10-25 DIAGNOSIS — E119 Type 2 diabetes mellitus without complications: Secondary | ICD-10-CM | POA: Diagnosis not present

## 2019-10-25 DIAGNOSIS — K529 Noninfective gastroenteritis and colitis, unspecified: Secondary | ICD-10-CM | POA: Diagnosis not present

## 2019-10-25 DIAGNOSIS — D509 Iron deficiency anemia, unspecified: Secondary | ICD-10-CM

## 2019-10-25 MED ORDER — SUTAB 1479-225-188 MG PO TABS: 1.0000 | ORAL_TABLET | Freq: Once | ORAL | 0 refills | Status: AC

## 2019-10-25 NOTE — Progress Notes (Signed)
Agree with the assessment and plan as outlined by Alonza Bogus, PA-C.  Plan for EGD/colonoscopy to be done at [facility].  We will plan for random and directed biopsies as outlined.  Ammaar Encina, DO, [facility]

## 2019-10-25 NOTE — Progress Notes (Signed)
10/25/2019 Janet Choi [id number] 08-Apr-1941   HISTORY OF PRESENT ILLNESS: This is a 79 year old female who is a patient of Dr. Corena Pilgrim, seen only once in 2017 for complaints of diarrhea and anemia.  Was supposed to have EGD and colonoscopy and never had those performed at that time.  Is here today for the same issues.  Referral was made for anemia, but she complains mostly of chronic diarrhea.    In regards to the anemia.  Most recent CBC shows a low white blood cell count 2.5, low hemoglobin at 9.0 grams, low platelets at 140.  Hemoglobin three weeks ago was 10.4 g, 10 days ago was 8.7 g.  Is seeing hematology as well.  Was recently started on iron supplements.  Denies any red blood in her stools, but says that they had been dark intermittently previously, but not recently.  Serum iron is low at 43, iron saturation is low at 10%, TIBC is normal at 410, ferritin is low at 10.  Vitamin B12 and folate levels are normal.  Had evaluation in 2008-2010 for iron deficiency anemia at that time.  Looks like she underwent EGD, colonoscopy, and wireless capsule endoscopy.  Last procedures were December 2010 colonoscopy which showed moderate diverticulosis in the sigmoid to descending colon.  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  Wireless capsule endoscopy performed April 2008 showed a fair prep, few tiny red spots, and distal ileal erythema that was nonspecific.  In regards to the diarrhea, that has been chronic as well as stated above at least dating back to 2017.  She feels that it has worsened over time.  She does not have diarrhea every day, but some days she has diarrhea several times a day until it is just water.  When this occurs she has severe urgency with the diarrhea and multiple episodes of incontinence.  Reports abdominal pain usually in the left lower quadrant.  She had a abnormal qualitative fecal fat study.  She says that she has started eating a lower fat diet and the diarrhea  seems to be better since she began doing that.  Is being evaluated for shortness of breath/fatigue by Dr. Lamonte Sakai of pulmonology.  Plan for PFTs in the near future.  She also has severe obstructive sleep apnea and does not use her CPAP machine.  Is having another sleep study performed in the near future.  He thinks that she may possibly benefit from supplemental oxygen, but is awaiting results of the studies.  Past Medical History:  Diagnosis Date  . Anemia    hx (04/14/2016)  . Anxiety   . Arthritis    "severe in my back; hands; ankles" (04/14/2016)  . Atypical chest pain 01/24/2018   Midline pain absent supine "constant" daytime since 12/31/17 > resolved as of 02/20/2018 on gerd/ gas diet   . Basal cell carcinoma    "several burned off; one cut off"  . CAD in native artery    a. NSTEMI 04/2016 - s/p DES toLAD and LCx. PCI to LCx notable for microembolization during cath.  . Chest pain- reslved with stopping Brilinta now on Plaix 04/16/2016  . Chronic lower back pain   . CKD (chronic kidney disease), stage III   . Depression   . Diverticulosis   . DJD (degenerative joint disease)   . Family history of adverse reaction to anesthesia    "half-sister used to get real sick" (04/14/2016)  . Gallstones   . GERD (gastroesophageal reflux disease)   .  History of gout   . Hyperlipidemia   . Hypertension   . IBS (irritable bowel syndrome)   . Ischemic cardiomyopathy    a. 04/2016: EF 40-50% by cath, 50-55% +WMA by echo.  . 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 ([place]) ~ 2015  . Morbid obesity ([place])   . NSTEMI (non-ST elevated myocardial infarction) ([place]) 04/14/2016  . OSA on CPAP   . Peripheral neuropathy   . Peripheral vascular disease ([place])   . RLS (restless legs syndrome)   . Spinal stenosis   . Type II diabetes mellitus ([place])   . Vitamin D deficiency    Past Surgical History:  Procedure Laterality Date  . APPENDECTOMY    . BASAL CELL CARCINOMA EXCISION Left    leg  . CARDIAC  CATHETERIZATION N/A 04/15/2016   Procedure: Left Heart Cath and Coronary Angiography;  Surgeon: Belva Crome, MD;  Location: [facility];  Service: Cardiovascular;  Laterality: N/A;  . CARDIAC CATHETERIZATION N/A 04/15/2016   Procedure: Coronary Stent Intervention;  Surgeon: Belva Crome, MD;  Location: [facility];  Service: Cardiovascular;  Laterality: N/A;  Mid LAD Mid CFX  . CARPAL TUNNEL RELEASE Right    with trigger finger release  . CATARACT EXTRACTION, BILATERAL Bilateral   . DEBRIDEMENT TENNIS ELBOW Right   . LUMBAR DISC SURGERY  X 2  . MELANOMA EXCISION Left    "towards the back of my neck"  . SHOULDER ARTHROSCOPY W/ ROTATOR CUFF REPAIR Left   . SHOULDER OPEN ROTATOR CUFF REPAIR Right   . TOTAL KNEE ARTHROPLASTY Bilateral     reports that she quit smoking about 35 years ago. Her smoking use included cigarettes. She has a 62.50 pack-year smoking history. She has never used smokeless tobacco. She reports that she does not drink alcohol and does not use drugs. family history includes AAA (abdominal aortic aneurysm) in her father; Alzheimer's disease in her sister; Cancer in her brother and sister; Heart attack in her mother; Heart disease in her mother and son; Kidney disease in her father; Parkinson's disease in her father. Allergies  Allergen Reactions  . Brilinta [Ticagrelor] Shortness Of Breath and Other (See Comments)    Chest pain (also)  . Aspirin Other (See Comments)    Can tolerate in small doses (is already on a blood thinner AND has kidney disease)  . Nsaids Other (See Comments)    Patient is taking a blood thinner and has kidney disease  . Minocycline Hcl Other (See Comments)    Welts  . Oruvail [Ketoprofen] Other (See Comments)    Has kidney disease and is taking a blood thinner  . Tricor [Fenofibrate]     Reaction unknown  . Vasotec [Enalaprilat]     Reaction unknown  . Zinc     Reaction unknown      Outpatient Encounter Medications as of  10/25/2019  Medication Sig  . acetaminophen (TYLENOL) 325 MG tablet Take 2 tablets (650 mg total) by mouth every 4 (four) hours as needed for headache or mild pain.  Marland Kitchen albuterol (VENTOLIN HFA) 108 (90 Base) MCG/ACT inhaler Inhale 2 puffs into the lungs every 4 (four) hours as needed for wheezing or shortness of breath.  Marland Kitchen aspirin EC 81 MG EC tablet Take 1 tablet (81 mg total) by mouth daily.  Marland Kitchen atorvastatin (LIPITOR) 80 MG tablet TAKE 1 TABLET (80 MG TOTAL) BY MOUTH DAILY AT 6 PM. (Patient taking differently: Take 80 mg by mouth every evening. )  .  benzonatate (TESSALON) 200 MG capsule Take 1 capsule (200 mg total) by mouth 3 (three) times daily as needed for cough (Max: '600mg'$  per day).  . Budeson-Glycopyrrol-Formoterol (BREZTRI AEROSPHERE) 160-9-4.8 MCG/ACT AERO Inhale 2 puffs into the lungs in the morning and at bedtime.  Marland Kitchen buPROPion (WELLBUTRIN XL) 300 MG 24 hr tablet Take 1 tablet (300 mg total) by mouth daily. Take 1 tablet Daily for Mood  . Cholecalciferol ([employer] VIT D 5000 HIGH-POTENCY) 5000 units capsule Take 5,000 Units by mouth 2 (two) times daily.   . Continuous Blood Gluc Receiver (FREESTYLE LIBRE 14 DAY READER) DEVI 1 Device by Does not apply route every 14 (fourteen) days.  . Continuous Blood Gluc Sensor (FREESTYLE LIBRE 14 DAY SENSOR) MISC Apply 1 application topically every 14 (fourteen) days.  . diclofenac sodium (VOLTAREN) 1 % GEL Apply 4 g topically 4 (four) times daily. (Patient taking differently: Apply 4 g topically daily as needed (For had pain). )  . escitalopram (LEXAPRO) 20 MG tablet Take 1 tablet (20 mg total) by mouth daily. (Patient taking differently: Take 30 mg by mouth daily. )  . ferrous sulfate 325 (65 FE) MG tablet Take 325 mg by mouth daily with breakfast.  . furosemide (LASIX) 40 MG tablet Take 40 mg by mouth as needed for fluid.  Marland Kitchen gabapentin (NEURONTIN) 100 MG capsule Take 1 to 2 capsules 3 to 4 x /day as needed for Painful Diabetic Neuropathy (Patient taking  differently: Take 200 mg by mouth in the morning, at noon, in the evening, and at bedtime. )  . insulin aspart (NOVOLOG FLEXPEN) 100 UNIT/ML FlexPen Inject 50  to 75 units 3 x /day before meals as directed for Diabetes (Patient taking differently: Inject 20 Units into the skin 3 (three) times daily with meals. Inject 50  to 75 units 3 x /day before meals as directed for Diabetes)  . Insulin Glargine (BASAGLAR KWIKPEN) 100 UNIT/ML SOPN Inject 70 units Daily for Diabetes (Patient taking differently: Inject 70 Units into the skin at bedtime. Inject 70 units Daily for Diabetes)  . Insulin Pen Needle (BD PEN NEEDLE NANO U/F) 32G X 4 MM MISC USE AS DIRECTED 4 TIMES A DAY  . ketoconazole (NIZORAL) 2 % cream Apply 1 application topically 2 (two) times daily.  . Lancets (ONETOUCH ULTRASOFT) lancets Use as instructed  . liraglutide (VICTOZA) 18 MG/3ML SOPN Inject 0.2 mLs (1.2 mg total) into the skin every morning.  Marland Kitchen losartan (COZAAR) 50 MG tablet Take 1 tablet Daily for BP & Diabetic Kidney Protection  . metoprolol succinate (TOPROL-XL) 25 MG 24 hr tablet Take 1 tablet (25 mg total) by mouth at bedtime.  . nitroGLYCERIN (NITROSTAT) 0.4 MG SL tablet Place 1 tablet (0.4 mg total) under the tongue every 5 (five) minutes x 3 doses as needed for chest pain.  Glory Rosebush VERIO test strip USE AS INSTRUCTED TO CHECK SUGAR 2 TIMES DAILY.  . pantoprazole (PROTONIX) 40 MG tablet Take 1 tablet (40 mg total) by mouth 2 (two) times daily before a meal.  . Polyethylene Glycol 400 (BLINK TEARS OP) Place 1 drop into both eyes daily as needed.  Marland Kitchen rOPINIRole (REQUIP) 3 MG tablet Take 1 tablet 4 x /day for Restless Legs (Patient taking differently: Take 3 mg by mouth in the morning, at noon, in the evening, and at bedtime. )  . tiZANidine (ZANAFLEX) 4 MG tablet Take 1 tablet at Bedtime as needed for Muscle Spasms  . vitamin B-12 (CYANOCOBALAMIN) 1000 MCG tablet Take 1,000  mcg by mouth daily.  . Sodium Sulfate-Mag Sulfate-KCl  (SUTAB) [phone] MG TABS Take 1 kit by mouth once for 1 dose. BIN: [id number] PCN: CN GROUP: [id number] MEMBER ID: [id number]; DO NOT RUN AS CASH   No facility-administered encounter medications on file as of 10/25/2019.    REVIEW OF SYSTEMS  : All other systems reviewed and negative except where noted in the History of Present Illness.  PHYSICAL EXAM: BP (!) 100/58 (BP Location: Left Arm, Patient Position: Sitting, Cuff Size: Normal)   Pulse 80   Ht 5' 1.5" (1.562 m) Comment: height measured without shoes  Wt 243 lb (110.2 kg)   BMI 45.17 kg/m  General: Well developed white female in no acute distress Head: Normocephalic and atraumatic Eyes:  Sclerae anicteric, conjunctiva pink. Ears: Normal auditory acuity Lungs: Clear throughout to auscultation; no increased WOB. Heart: Regular rate and rhythm; no M/R/G. Abdomen: Soft, non-distended.  BS present.  Mild diffuse TTP. Rectal:  Will be done at the time of colonoscopy. Musculoskeletal: Symmetrical with no gross deformities  Skin: No lesions on visible extremities Extremities: No edema  Neurological: Alert oriented x 4, grossly non-focal Psychological:  Alert and cooperative. Normal mood and affect  ASSESSMENT AND PLAN: *Iron deficiency anemia: This has been a recurrent issue over the years and she was evaluated last in 2008-2010 from a GI standpoint that was relatively unrevealing.  Now with recurrent anemia and hemoglobin downtrending over the past several months, stable currently.  Saw hematology as well and was started on iron supplements. *Chronic diarrhea with urgency: Had an abnormal qualitative fecal fat level.  May have EPI.  Will confirm with fecal elastase.  If abnormal then will start on pancreatic enzymes.  She reports some improvement since she has been trying to follow a lower fat diet. *Fatigue/shortness of breath: Being evaluated by pulmonary.  Likely multifactorial from recent recurrent iron deficiency anemia,  deconditioning, obesity, untreated severe sleep apnea.  Pulmonary mentioned that she may benefit from supplemental oxygen, but this has not yet been started. *Pancytopenia:  Has low WBC, RBC, and platelets.  Once again, being evaluated by hematology. *IDDM:  Insulin will be adjusted prior to endoscopic procedure per protocol. Will resume normal dosing after procedure.  **She needs both EGD and colonoscopy to evaluate iron deficiency anemia and colonoscopy to further evaluate the diarrhea for other causes including microscopic colitis, etc as well.  Please perform small bowel biopsies as well to rule out celiac disease as cause of these issues.  We are going to plan for that as at [facility] long hospital with Dr. Bryan Lemma due to no availability with Dr. Loletha Carrow and a very long hospital waiting list.     CC:  [name], MD

## 2019-10-25 NOTE — Progress Notes (Signed)
Thanks for seeing her and also arranging procedures.   Agree anemia multifactorial in setting of pancytopenia.  - HD

## 2019-10-25 NOTE — Patient Instructions (Addendum)
If you are age 79 or older, your body mass index should be between 23-30. Your Body mass index is 45.17 kg/m. If this is out of the aforementioned range listed, please consider follow up with your Primary Care Provider.  If you are age 34 or younger, your body mass index should be between 19-25. Your Body mass index is 45.17 kg/m. If this is out of the aformentioned range listed, please consider follow up with your Primary Care Provider.   Your provider has requested that you go to the basement level for lab work before leaving today. Press "B" on the elevator. The lab is located at the first door on the left as you exit the elevator.  You have been scheduled for a colonoscopy. Please follow written instructions given to you at your visit today.  Please pick up your prep supplies at the pharmacy within the next 1-3 days. If you use inhalers (even only as needed), please bring them with you on the day of your procedure.  Due to recent changes in healthcare laws, you may see the results of your imaging and laboratory studies on MyChart before your provider has had a chance to review them.  We understand that in some cases there may be results that are confusing or concerning to you. Not all laboratory results come back in the same time frame and the provider may be waiting for multiple results in order to interpret others.  Please give [place] 48 hours in order for your provider to thoroughly review all the results before contacting the office for clarification of your results.

## 2019-10-29 ENCOUNTER — Other Ambulatory Visit (HOSPITAL_COMMUNITY)
Admission: RE | Admit: 2019-10-29 | Discharge: 2019-10-29 | Disposition: A | Discharge status: Home / Self Care | Payer: PPO | Source: Ambulatory Visit | Attending: Pulmonary Disease | Admitting: Pulmonary Disease

## 2019-10-29 DIAGNOSIS — Z01812 Encounter for preprocedural laboratory examination: Secondary | ICD-10-CM | POA: Insufficient documentation

## 2019-10-29 DIAGNOSIS — Z20822 Contact with and (suspected) exposure to covid-19: Secondary | ICD-10-CM | POA: Diagnosis not present

## 2019-10-29 LAB — SARS CORONAVIRUS 2 (TAT 6-24 HRS): SARS Coronavirus 2: NEGATIVE

## 2019-10-30 ENCOUNTER — Telehealth: Payer: Self-pay

## 2019-10-30 NOTE — Telephone Encounter (Signed)
Spoke with [place], Teaching laboratory technician. She states that the information that was provided is not enough to be seen for surgery. This needs to be clarified, needs additional notes and clarity about whom you would like her to see. Please advise.

## 2019-10-31 ENCOUNTER — Other Ambulatory Visit: Payer: PPO

## 2019-10-31 ENCOUNTER — Ambulatory Visit (HOSPITAL_BASED_OUTPATIENT_CLINIC_OR_DEPARTMENT_OTHER): Payer: PPO | Attending: Emergency Medicine | Admitting: Pulmonary Disease

## 2019-10-31 ENCOUNTER — Telehealth: Payer: Self-pay | Admitting: Physician Assistant

## 2019-10-31 ENCOUNTER — Other Ambulatory Visit: Payer: Self-pay

## 2019-10-31 DIAGNOSIS — K529 Noninfective gastroenteritis and colitis, unspecified: Secondary | ICD-10-CM | POA: Diagnosis not present

## 2019-10-31 DIAGNOSIS — I10 Essential (primary) hypertension: Secondary | ICD-10-CM | POA: Diagnosis not present

## 2019-10-31 DIAGNOSIS — Z6841 Body Mass Index (BMI) 40.0 and over, adult: Secondary | ICD-10-CM | POA: Diagnosis not present

## 2019-10-31 DIAGNOSIS — E669 Obesity, unspecified: Secondary | ICD-10-CM | POA: Diagnosis not present

## 2019-10-31 DIAGNOSIS — D509 Iron deficiency anemia, unspecified: Secondary | ICD-10-CM | POA: Diagnosis not present

## 2019-10-31 DIAGNOSIS — Z79899 Other long term (current) drug therapy: Secondary | ICD-10-CM | POA: Insufficient documentation

## 2019-10-31 DIAGNOSIS — H26493 Other secondary cataract, bilateral: Secondary | ICD-10-CM | POA: Diagnosis not present

## 2019-10-31 DIAGNOSIS — E119 Type 2 diabetes mellitus without complications: Secondary | ICD-10-CM | POA: Diagnosis not present

## 2019-10-31 DIAGNOSIS — H524 Presbyopia: Secondary | ICD-10-CM | POA: Diagnosis not present

## 2019-10-31 DIAGNOSIS — J449 Chronic obstructive pulmonary disease, unspecified: Secondary | ICD-10-CM | POA: Diagnosis not present

## 2019-10-31 DIAGNOSIS — H43813 Vitreous degeneration, bilateral: Secondary | ICD-10-CM | POA: Diagnosis not present

## 2019-10-31 DIAGNOSIS — E113292 Type 2 diabetes mellitus with mild nonproliferative diabetic retinopathy without macular edema, left eye: Secondary | ICD-10-CM | POA: Diagnosis not present

## 2019-10-31 DIAGNOSIS — G4733 Obstructive sleep apnea (adult) (pediatric): Secondary | ICD-10-CM | POA: Diagnosis not present

## 2019-10-31 LAB — HM DIABETES EYE EXAM

## 2019-10-31 NOTE — Telephone Encounter (Signed)
[place] results show gallbladder wall thickening,  + murphy's, patient with AB pain ongoing with extensive negative cardiac work up.  Will refer to gen surgery for evaluation.  Can get HIDA if they advised but at this time with the ongoing symptoms and [place] will refer. .   Lab Results  Component Value Date   ALT 8 10/18/2019   AST 9 (L) 10/18/2019   ALKPHOS 90 10/18/2019   BILITOT 0.4 10/18/2019   Lab Results  Component Value Date   WBC 2.5 (L) 10/18/2019   HGB 9.0 (L) 10/18/2019   HCT 28.2 (L) 10/18/2019   MCV 106.1 (H) 10/18/2019   PLT 140 (L) 10/18/2019

## 2019-11-01 ENCOUNTER — Ambulatory Visit: Payer: PPO | Admitting: Gastroenterology

## 2019-11-04 ENCOUNTER — Encounter: Payer: Self-pay | Admitting: Internal Medicine

## 2019-11-04 NOTE — Progress Notes (Signed)
History of Present Illness:       This very nice 79 y.o. 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 Patient's Gout is  quiescent on her meds. Patient also has severe OSA on CPAP with improved sleep hygiene.      Patient has had recent GI evaluation for chronic recurrent diarrhea in conjunction with worsening pancytopenia. Patient tentatively scheduled for EGD/Colon. Patient has had recent GB U/S showing GB wall thickening  w/(+) Murphy's sign. Dr [name] (Hem/Onc) recommended HIDA scan.       Patient is treated for HTN (1997) & BP has been controlled at home. Today's BP  Is at goal -  126/72. In Dec 2017, patient had NSTEMI with PTCA x 2 stents. Patient has had no complaints of any cardiac type chest pain, palpitations, dyspnea / orthopnea / PND, dizziness, claudication, or dependent edema.      Hyperlipidemia is controlled with diet & meds. Patient denies myalgias or other med SE's. 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 Patient wa stransitioned to Insulin in 2017.and has had no symptoms of reactive hypoglycemia, diabetic polys or visual blurring. 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  Last A1c was not at goal:  Lab Results  Component Value Date   HGBA1C 9.5 (H) 07/25/2019       Further, the patient also has history of Vitamin D Deficiency ("25"/2008)and supplements vitamin D without any suspected side-effects. Last vitamin D was slightly  low (goal 70-100):  Lab Results  Component Value Date   VD25OH 51 04/30/2019    Current Outpatient Medications on File Prior to Visit  Medication Sig  .  acetaminophen (TYLENOL) 325 MG tablet Take 2 tablets (650 mg total) by mouth every 4 (four) hours as needed for headache or mild pain.  Marland Kitchen albuterol (VENTOLIN HFA) 108 (90 Base) MCG/ACT inhaler Inhale 2 puffs into the lungs every 4 (four) hours as needed for wheezing or shortness of breath.  Marland Kitchen aspirin EC 81 MG EC tablet Take 1 tablet (81 mg total) by mouth daily.  Marland Kitchen atorvastatin (LIPITOR) 80 MG tablet TAKE 1 TABLET (80 MG TOTAL) BY MOUTH DAILY AT 6 PM. (Patient taking differently: Take 80 mg by mouth every evening. )  . Budeson-Glycopyrrol-Formoterol (BREZTRI AEROSPHERE) 160-9-4.8 MCG/ACT AERO Inhale 2 puffs into the lungs in the morning and at bedtime.  Marland Kitchen buPROPion (WELLBUTRIN XL) 300 MG 24 hr tablet Take 1 tablet (300 mg total) by mouth daily. Take 1 tablet Daily for Mood  . Cholecalciferol ([employer] VIT D 5000 HIGH-POTENCY) 5000 units capsule Take 5,000 Units by mouth 2 (two) times daily.   . Continuous Blood Gluc Receiver (FREESTYLE LIBRE 14 DAY READER) DEVI 1 Device by Does not apply route every 14 (fourteen) days.  . Continuous Blood Gluc Sensor (FREESTYLE LIBRE 14 DAY SENSOR) MISC Apply 1 application topically every 14 (fourteen) days.  . diclofenac sodium (VOLTAREN) 1 % GEL Apply 4 g topically 4 (four) times daily. (Patient taking differently: Apply 4 g topically daily as needed (For had pain). )  .  escitalopram (LEXAPRO) 20 MG tablet Take 1 tablet (20 mg total) by mouth daily. (Patient taking differently: Take 30 mg by mouth daily. )  . ferrous sulfate 325 (65 FE) MG tablet Take 325 mg by mouth daily with breakfast.  . furosemide (LASIX) 40 MG tablet Take 40 mg by mouth as needed for fluid.  Marland Kitchen gabapentin (NEURONTIN) 100 MG capsule Take 1 to 2 capsules 3 to 4 x /day as needed for Painful Diabetic Neuropathy (Patient taking differently: Take 200 mg by mouth in the morning, at noon, in the evening, and at bedtime. )  . insulin aspart (NOVOLOG FLEXPEN) 100 UNIT/ML FlexPen Inject 50  to 75 units 3 x /day  before meals as directed for Diabetes (Patient taking differently: Inject 20 Units into the skin 3 (three) times daily with meals. Inject 50  to 75 units 3 x /day before meals as directed for Diabetes)  . Insulin Glargine (BASAGLAR KWIKPEN) 100 UNIT/ML SOPN Inject 70 units Daily for Diabetes (Patient taking differently: Inject 70 Units into the skin at bedtime. Inject 70 units Daily for Diabetes)  . Insulin Pen Needle (BD PEN NEEDLE NANO U/F) 32G X 4 MM MISC USE AS DIRECTED 4 TIMES A DAY  . ketoconazole (NIZORAL) 2 % cream Apply 1 application topically 2 (two) times daily.  . Lancets (ONETOUCH ULTRASOFT) lancets Use as instructed  . liraglutide (VICTOZA) 18 MG/3ML SOPN Inject 0.2 mLs (1.2 mg total) into the skin every morning.  Marland Kitchen losartan (COZAAR) 50 MG tablet Take 1 tablet Daily for BP & Diabetic Kidney Protection  . nitroGLYCERIN (NITROSTAT) 0.4 MG SL tablet Place 1 tablet (0.4 mg total) under the tongue every 5 (five) minutes x 3 doses as needed for chest pain.  Glory Rosebush VERIO test strip USE AS INSTRUCTED TO CHECK SUGAR 2 TIMES DAILY.  . pantoprazole (PROTONIX) 40 MG tablet Take 1 tablet (40 mg total) by mouth 2 (two) times daily before a meal.  . Polyethylene Glycol 400 (BLINK TEARS OP) Place 1 drop into both eyes daily as needed.  Marland Kitchen rOPINIRole (REQUIP) 3 MG tablet Take 1 tablet 4 x /day for Restless Legs (Patient taking differently: Take 3 mg by mouth in the morning, at noon, in the evening, and at bedtime. )  . tiZANidine (ZANAFLEX) 4 MG tablet Take 1 tablet at Bedtime as needed for Muscle Spasms  . vitamin B-12 (CYANOCOBALAMIN) 1000 MCG tablet Take 1,000 mcg by mouth daily.  . metoprolol succinate (TOPROL-XL) 25 MG 24 hr tablet Take 1 tablet (25 mg total) by mouth at bedtime.   No current facility-administered medications on file prior to visit.    Allergies  Allergen Reactions  . Brilinta [Ticagrelor] Shortness Of Breath and Other (See Comments)    Chest pain (also)  . Aspirin Other  (See Comments)    Can tolerate in small doses (is already on a blood thinner AND has kidney disease)  . Nsaids Other (See Comments)    Patient is taking a blood thinner and has kidney disease  . Minocycline Hcl Other (See Comments)    Welts  . Oruvail [Ketoprofen] Other (See Comments)    Has kidney disease and is taking a blood thinner  . Tricor [Fenofibrate]     Reaction unknown  . Vasotec [Enalaprilat]     Reaction unknown  . Zinc     Reaction unknown    PMHx:   Past Medical History:  Diagnosis Date  . Anemia    hx (04/14/2016)  . Anxiety   .  Arthritis    "severe in my back; hands; ankles" (04/14/2016)  . Atypical chest pain 01/24/2018   Midline pain absent supine "constant" daytime since 12/31/17 > resolved as of 02/20/2018 on gerd/ gas diet   . Basal cell carcinoma    "several burned off; one cut off"  . CAD in native artery    a. NSTEMI 04/2016 - s/p DES toLAD and LCx. PCI to LCx notable for microembolization during cath.  . Chest pain- reslved with stopping Brilinta now on Plaix 04/16/2016  . Chronic lower back pain   . CKD (chronic kidney disease), stage III   . Depression   . Diverticulosis   . DJD (degenerative joint disease)   . Family history of adverse reaction to anesthesia    "half-sister used to get real sick" (04/14/2016)  . Gallstones   . GERD (gastroesophageal reflux disease)   . History of gout   . Hyperlipidemia   . Hypertension   . IBS (irritable bowel syndrome)   . Ischemic cardiomyopathy    a. 04/2016: EF 40-50% by cath, 50-55% +WMA by echo.  . 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 ([place]) ~ 2015  . Morbid obesity ([place])   . NSTEMI (non-ST elevated myocardial infarction) ([place]) 04/14/2016  . OSA on CPAP   . Peripheral neuropathy   . Peripheral vascular disease ([place])   . RLS (restless legs syndrome)   . Spinal stenosis   . Type II diabetes mellitus ([place])   . Vitamin D deficiency     Immunization History  Administered Date(s) Administered  .  DT (Pediatric) 04/14/2015  . Hepatitis B 07/09/1999, 08/09/1999, 01/09/2000  . Influenza, High Dose Seasonal PF 03/20/2014, 02/14/2017, 04/12/2018, 02/22/2019  . Influenza,inj,quad, With Preservative 04/26/2016  . Influenza-Unspecified 03/28/2015, 04/14/2015, 02/14/2017  . PFIZER SARS-COV-2 Vaccination 08/14/2019, 09/04/2019  . Pneumococcal Conjugate-13 03/20/2014  . Pneumococcal Polysaccharide-23 10/15/2009  . Td 03/19/2004    Past Surgical History:  Procedure Laterality Date  . APPENDECTOMY    . BASAL CELL CARCINOMA EXCISION Left    leg  . CARDIAC CATHETERIZATION N/A 04/15/2016   Procedure: Left Heart Cath and Coronary Angiography;  Surgeon: Belva Crome, MD;  Location: [facility];  Service: Cardiovascular;  Laterality: N/A;  . CARDIAC CATHETERIZATION N/A 04/15/2016   Procedure: Coronary Stent Intervention;  Surgeon: Belva Crome, MD;  Location: [place] [facility];  Service: Cardiovascular;  Laterality: N/A;  Mid LAD Mid CFX  . CARPAL TUNNEL RELEASE Right    with trigger finger release  . CATARACT EXTRACTION, BILATERAL Bilateral   . DEBRIDEMENT TENNIS ELBOW Right   . LUMBAR DISC SURGERY  X 2  . MELANOMA EXCISION Left    "towards the back of my neck"  . SHOULDER ARTHROSCOPY W/ ROTATOR CUFF REPAIR Left   . SHOULDER OPEN ROTATOR CUFF REPAIR Right   . TOTAL KNEE ARTHROPLASTY Bilateral     FHx:    Reviewed / unchanged  SHx:    Reviewed / unchanged   Systems Review:  Constitutional: Denies fever, chills, wt changes, headaches, insomnia, fatigue, night sweats, change in appetite. Eyes: Denies redness, blurred vision, diplopia, discharge, itchy, watery eyes.  ENT: Denies discharge, congestion, post nasal drip, epistaxis, sore throat, earache, hearing loss, dental pain, tinnitus, vertigo, sinus pain, snoring.  CV: Denies chest pain, palpitations, irregular heartbeat, syncope, dyspnea, diaphoresis, orthopnea, PND, claudication or edema. Respiratory: denies cough, dyspnea,  DOE, pleurisy, hoarseness, laryngitis, wheezing.  Gastrointestinal: Denies dysphagia, odynophagia, heartburn, reflux, water brash, abdominal pain or  cramps, nausea, vomiting, bloating, diarrhea, constipation, hematemesis, melena, hematochezia  or hemorrhoids. Genitourinary: Denies dysuria, frequency, urgency, nocturia, hesitancy, discharge, hematuria or flank pain. Musculoskeletal: Denies arthralgias, myalgias, stiffness, jt. swelling, pain, limping or strain/sprain.  Skin: Denies pruritus, rash, hives, warts, acne, eczema or change in skin lesion(s). Neuro: No weakness, tremor, incoordination, spasms, paresthesia or pain. Psychiatric: Denies confusion, memory loss or sensory loss. Endo: Denies change in weight, skin or hair change.  Heme/Lymph: No excessive bleeding, bruising or enlarged lymph nodes.  Physical Exam  BP 126/72   Pulse 80   Temp 97.6 F (36.4 C)   Resp 18   Ht 5\' 1"  (1.549 m)   Wt 244 lb 3.2 oz (110.8 kg)   BMI 46.14 kg/m   Appears  well nourished, well groomed  and in no distress.  Eyes: PERRLA, EOMs, conjunctiva no swelling or erythema. Sinuses: No frontal/maxillary tenderness ENT/Mouth: EAC's clear, TM's nl w/o erythema, bulging. Nares clear w/o erythema, swelling, exudates. Oropharynx clear without erythema or exudates. Oral hygiene is good. Tongue normal, non obstructing. Hearing intact.  Neck: Supple. Thyroid not palpable. Car 2+/2+ without bruits, nodes or JVD. Chest: Respirations nl with BS clear & equal w/o rales, rhonchi, wheezing or stridor.  Cor: Heart sounds normal w/ regular rate and rhythm without sig. murmurs, gallops, clicks or rubs. Peripheral pulses normal and equal  without edema.  Abdomen: Soft & bowel sounds normal. Non-tender w/o guarding, rebound, hernias, masses or organomegaly.  Lymphatics: Unremarkable.  Musculoskeletal: Full ROM all peripheral extremities, joint stability, 5/5 strength and normal gait.  Skin: Warm, dry without exposed  rashes, lesions or ecchymosis apparent.  Neuro: Cranial nerves intact, reflexes equal bilaterally. Sensory-motor testing grossly intact. Tendon reflexes grossly intact.  Pysch: Alert & oriented x 3.  Insight and judgement nl & appropriate. No ideations.  Assessment and Plan:  1. Essential hypertension  - Continue medication, monitor blood pressure at home.  - Continue DASH diet.  Reminder to go to the ER if any CP,  SOB, nausea, dizziness, severe HA, changes vision/speech.  - CBC with Differential/Platelet - COMPLETE METABOLIC PANEL WITH GFR - Magnesium - TSH  2. Hyperlipidemia associated with type 2 diabetes mellitus ([place])  - Continue diet/meds, exercise,& lifestyle modifications.  - Continue monitor periodic cholesterol/liver & renal functions   - Lipid panel - TSH  3. Uncontrolled type 2 diabetes mellitus with stage 3 chronic  kidney disease, with long-term current use of insulin (HCC)  - Continue diet, exercise  - Lifestyle modifications.  - Monitor appropriate labs.  - Hemoglobin A1c  4. Vitamin D deficiency  - Continue supplementation.  - VITAMIN D 25 Hydroxy   5. Anemia, unspecified type  - CBC with Differential/Platelet  6. Diabetic polyneuropathy associated with type 2 diabetes mellitus (HCC)  - Hemoglobin A1c  7. 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 ([place])  - Lipid panel  8. Gastroesophageal reflux disease   - CBC with Differential/Platelet  9. Medication management  - CBC with Differential/Platelet - COMPLETE METABOLIC PANEL WITH GFR - Magnesium - Lipid panel - TSH - Hemoglobin A1c - VITAMIN D 25 Hydroxy   10. RUQ pain  - NM Hepato W/Eject Fract; Future         Discussed  regular exercise, BP monitoring, weight control to achieve/maintain BMI less than 25 and discussed med and SE's. Recommended labs to assess and monitor clinical status with further disposition pending results of labs.  I discussed the assessment and  treatment plan with  the patient. The patient was provided an opportunity to ask questions and all were answered. The patient agreed with the plan and demonstrated an understanding of the instructions.  I provided over 30 minutes of exam, counseling, chart review and  complex critical decision making.   Kirtland Bouchard, MD

## 2019-11-04 NOTE — Patient Instructions (Signed)

## 2019-11-05 ENCOUNTER — Other Ambulatory Visit: Payer: Self-pay

## 2019-11-05 ENCOUNTER — Ambulatory Visit (INDEPENDENT_AMBULATORY_CARE_PROVIDER_SITE_OTHER): Payer: PPO | Admitting: Internal Medicine

## 2019-11-05 ENCOUNTER — Ambulatory Visit: Payer: PPO | Admitting: Nurse Practitioner

## 2019-11-05 VITALS — BP 126/72 | HR 80 | Temp 97.6°F | Resp 18 | Ht 61.0 in | Wt 244.2 lb

## 2019-11-05 DIAGNOSIS — N183 Chronic kidney disease, stage 3 unspecified: Secondary | ICD-10-CM | POA: Diagnosis not present

## 2019-11-05 DIAGNOSIS — I2510 Atherosclerotic heart disease of native coronary artery without angina pectoris: Secondary | ICD-10-CM | POA: Diagnosis not present

## 2019-11-05 DIAGNOSIS — Z794 Long term (current) use of insulin: Secondary | ICD-10-CM

## 2019-11-05 DIAGNOSIS — E1142 Type 2 diabetes mellitus with diabetic polyneuropathy: Secondary | ICD-10-CM

## 2019-11-05 DIAGNOSIS — R1011 Right upper quadrant pain: Secondary | ICD-10-CM | POA: Diagnosis not present

## 2019-11-05 DIAGNOSIS — E785 Hyperlipidemia, unspecified: Secondary | ICD-10-CM | POA: Diagnosis not present

## 2019-11-05 DIAGNOSIS — I10 Essential (primary) hypertension: Secondary | ICD-10-CM | POA: Diagnosis not present

## 2019-11-05 DIAGNOSIS — E1165 Type 2 diabetes mellitus with hyperglycemia: Secondary | ICD-10-CM | POA: Diagnosis not present

## 2019-11-05 DIAGNOSIS — E1122 Type 2 diabetes mellitus with diabetic chronic kidney disease: Secondary | ICD-10-CM

## 2019-11-05 DIAGNOSIS — E1169 Type 2 diabetes mellitus with other specified complication: Secondary | ICD-10-CM | POA: Diagnosis not present

## 2019-11-05 DIAGNOSIS — IMO0002 Reserved for concepts with insufficient information to code with codable children: Secondary | ICD-10-CM

## 2019-11-05 DIAGNOSIS — K2100 Gastro-esophageal reflux disease with esophagitis, without bleeding: Secondary | ICD-10-CM

## 2019-11-05 DIAGNOSIS — E559 Vitamin D deficiency, unspecified: Secondary | ICD-10-CM

## 2019-11-05 DIAGNOSIS — Z79899 Other long term (current) drug therapy: Secondary | ICD-10-CM

## 2019-11-05 DIAGNOSIS — E1159 Type 2 diabetes mellitus with other circulatory complications: Secondary | ICD-10-CM

## 2019-11-05 DIAGNOSIS — D649 Anemia, unspecified: Secondary | ICD-10-CM | POA: Diagnosis not present

## 2019-11-06 ENCOUNTER — Telehealth: Payer: Self-pay | Admitting: *Deleted

## 2019-11-06 ENCOUNTER — Telehealth: Payer: Self-pay | Admitting: Hematology

## 2019-11-06 ENCOUNTER — Encounter: Payer: Self-pay | Admitting: Internal Medicine

## 2019-11-06 LAB — COMPLETE METABOLIC PANEL WITH GFR
AG Ratio: 2.1 (calc) (ref 1.0–2.5)
ALT: 8 U/L (ref 6–29)
AST: 6 U/L — ABNORMAL LOW (ref 10–35)
Albumin: 4.1 g/dL (ref 3.6–5.1)
Alkaline phosphatase (APISO): 88 U/L (ref 37–153)
BUN/Creatinine Ratio: 15 (calc) (ref 6–22)
BUN: 21 mg/dL (ref 7–25)
CO2: 28 mmol/L (ref 20–32)
Calcium: 9.2 mg/dL (ref 8.6–10.4)
Chloride: 103 mmol/L (ref 98–110)
Creat: 1.4 mg/dL — ABNORMAL HIGH (ref 0.60–0.93)
GFR, Est African American: 42 mL/min/{1.73_m2} — ABNORMAL LOW (ref >=60)
GFR, Est Non African American: 36 mL/min/{1.73_m2} — ABNORMAL LOW (ref >=60)
Globulin: 2 g/dL (calc) (ref 1.9–3.7)
Glucose, Bld: 281 mg/dL — ABNORMAL HIGH (ref 65–99)
Potassium: 4.7 mmol/L (ref 3.5–5.3)
Sodium: 140 mmol/L (ref 135–146)
Total Bilirubin: 0.5 mg/dL (ref 0.2–1.2)
Total Protein: 6.1 g/dL (ref 6.1–8.1)

## 2019-11-06 LAB — CBC WITH DIFFERENTIAL/PLATELET
Absolute Monocytes: 81 cells/uL — ABNORMAL LOW (ref 200–950)
Basophils Absolute: 0 cells/uL (ref 0–200)
Basophils Relative: 0 %
Eosinophils Absolute: 101 cells/uL (ref 15–500)
Eosinophils Relative: 4.6 %
HCT: 31.8 % — ABNORMAL LOW (ref 35.0–45.0)
Hemoglobin: 10.4 g/dL — ABNORMAL LOW (ref 11.7–15.5)
Lymphs Abs: 812 cells/uL — ABNORMAL LOW (ref 850–3900)
MCH: 32.8 pg (ref 27.0–33.0)
MCHC: 32.7 g/dL (ref 32.0–36.0)
MCV: 100.3 fL — ABNORMAL HIGH (ref 80.0–100.0)
MPV: 10.3 fL (ref 7.5–12.5)
Monocytes Relative: 3.7 %
Neutro Abs: 1206 cells/uL — ABNORMAL LOW (ref 1500–7800)
Neutrophils Relative %: 54.8 %
Platelets: 136 10*3/uL — ABNORMAL LOW (ref 140–400)
RBC: 3.17 10*6/uL — ABNORMAL LOW (ref 3.80–5.10)
RDW: 14.3 % (ref 11.0–15.0)
Total Lymphocyte: 36.9 %
WBC: 2.2 10*3/uL — ABNORMAL LOW (ref 3.8–10.8)

## 2019-11-06 LAB — LIPID PANEL
Cholesterol: 125 mg/dL (ref <200)
HDL: 40 mg/dL — ABNORMAL LOW (ref >=50)
LDL Cholesterol (Calc): 52 mg/dL (calc)
Non-HDL Cholesterol (Calc): 85 mg/dL (calc) (ref <130)
Total CHOL/HDL Ratio: 3.1 (calc) (ref <5.0)
Triglycerides: 312 mg/dL — ABNORMAL HIGH (ref <150)

## 2019-11-06 LAB — TSH: TSH: 2.83 mIU/L (ref 0.40–4.50)

## 2019-11-06 LAB — HEMOGLOBIN A1C
Hgb A1c MFr Bld: 7.4 % of total Hgb — ABNORMAL HIGH (ref <5.7)
Mean Plasma Glucose: 166 (calc)
eAG (mmol/L): 9.2 (calc)

## 2019-11-06 LAB — MAGNESIUM: Magnesium: 1.6 mg/dL (ref 1.5–2.5)

## 2019-11-06 LAB — VITAMIN D 25 HYDROXY (VIT D DEFICIENCY, FRACTURES): Vit D, 25-Hydroxy: 40 ng/mL (ref 30–100)

## 2019-11-06 LAB — PANCREATIC ELASTASE, FECAL: Pancreatic Elastase-1, Stool: 469 mcg/g

## 2019-11-06 NOTE — Telephone Encounter (Signed)
Scheduled per los, spoke with patient's relative and patient will be notified.

## 2019-11-06 NOTE — Progress Notes (Signed)
==========================================================  -  Chronic mild anemia is stable  ==========================================================  -  Kidney functions - still Stage 3b  & stable  ==========================================================  -  Total Chol = 125 and LDL Chol = 52  - Both Excellent   - Very low risk for Heart Attack  / Stroke =============================================================  -  But Triglycerides (   312   ) or fats in blood are too high  (goal is less than 150)    - Recommend avoid fried & greasy foods,  sweets / candy,   - Avoid white rice  (brown or wild rice or Quinoa is OK),   - Avoid white potatoes  (sweet potatoes are OK)   - Avoid anything made from white flour  - bagels, doughnuts, rolls, buns, biscuits, white and   wheat breads, pizza crust and traditional  pasta made of white flour & egg white  - (vegetarian pasta or spinach or wheat pasta is OK).    - Multi-grain bread is OK - like multi-grain flat bread or  sandwich thins.   - Avoid alcohol in excess.   - Exercise is also important. ==========================================================  -  A1c is better - Down from 9.5%   to    Now 7.4% - Better - But still too high    Being diabetic has a  300% increased risk for heart attack,  stroke, cancer, and alzheimer- type vascular dementia.   It is very important that you work harder with diet by  avoiding all foods that are white except chicken,   fish & calliflower.  - Avoid white rice  (brown & wild rice is OK),   - Avoid white potatoes  (sweet potatoes in moderation is OK),   White bread or wheat bread or anything made out of   white flour like bagels, donuts, rolls, buns, biscuits, cakes,  - pastries, cookies, pizza crust, and pasta (made from  white flour & egg whites)   - vegetarian pasta or spinach or wheat pasta is OK.  - Multigrain breads like Arnold's, Pepperidge Farm or   multigrain  sandwich thins or high fiber breads like   [place] bread or "Dave's Killer" breads that are  4 to 5 grams fiber per slice !  are best.    Diet, exercise and weight loss can reverse and cure  diabetes in the early stages.    - Diet, exercise and weight loss is very important in the   control and prevention of complications of diabetes which  affects every system in your body, ie.   -Brain - dementia/stroke,  - eyes - glaucoma/blindness,  - heart - heart attack/heart failure,  - kidneys - dialysis,  - stomach - gastric paralysis,  - intestines - malabsorption,  - nerves - severe painful neuritis,  - circulation - gangrene & loss of a leg(s)  - and finally  . . . . . . . . . . . . . . . . . .    - cancer and Alzheimers. ==========================================================  -  Vitamin D = 40  - low   - Vitamin D goal is between 70-100.   - Please make sure that you are taking your Vitamin D 10,000 units Daily as directed.   - It is very important as a natural anti-inflammatory and helping the  immune system protect against viral infections, like the Covid-19    helping hair, skin, and nails, as well as reducing stroke and heart attack risk.   -  It helps your bones and helps with mood.  - It also decreases numerous cancer risks so please take it as directed.   - Low Vit D is associated with a 200-300% higher risk for CANCER   and 200-300% higher risk for HEART   ATTACK  &  STROKE.    - It is also associated with higher death rate at younger ages,   autoimmune diseases like Rheumatoid arthritis, Lupus, Multiple Sclerosis.     - Also many other serious conditions, like depression, Alzheimer's  Dementia, infertility, muscle aches, fatigue, fibromyalgia - just to name a few.  ==========================================================  -  All Else - CBC - Kidneys - Electrolytes -  Liver - Magnesium & Thyroid  - all  Normal /  OK ==========================================================

## 2019-11-06 NOTE — Telephone Encounter (Signed)
Spoke with scheduler at [facility] radiology. She states she will call patient to schedule . She was informed no pre-cert needed, per [employer].

## 2019-11-06 NOTE — Telephone Encounter (Signed)
Called [employer] in regard to HIDA Scan([id number]) and was advised no prior auth needed for this procedure.

## 2019-11-07 ENCOUNTER — Telehealth: Payer: Self-pay | Admitting: Hematology

## 2019-11-07 NOTE — Progress Notes (Signed)
HEMATOLOGY/ONCOLOGY CLINIC NOTE  Date of Service: 11/08/2019  Patient Care Team: [name], MD as PCP - General (Internal Medicine) Skeet Latch, MD as PCP - Cardiology (Cardiology) Marygrace Drought, MD as Consulting Physician (Ophthalmology) Larey Dresser, MD as Consulting Physician (Cardiology) Nobie Putnam, MD as Consulting Physician (Oncology) Tania Ade, MD as Consulting Physician (Orthopedic Surgery) [place], Kirke Corin, MD as Consulting Physician (Gastroenterology)  REFERRING PHYSICIAN: [name], MD  CHIEF COMPLAINTS/PURPOSE OF CONSULTATION:  Pancytopenia  HISTORY OF PRESENTING ILLNESS:  I connected with Janet Choi on 11/08/19 at 10:00 AM EDT and verified that I am speaking with the correct person using two identifiers.   I discussed the limitations, risks, security and privacy concerns of performing an evaluation and management service by telemedicine and the availability of in-person appointments. I also discussed with the patient that there may be a patient responsible charge related to this service. The patient expressed understanding and agreed to proceed.   Other persons participating in the visit and their role in the encounter:    -Dawayne Cirri, Medical Scribe   Patient's location: Home  Provider's location: [place] [facility] [employer] is a wonderful 79 y.o. female who has been referred to [place] by [name], MD for evaluation and management of pancytopenia. Pt is accompanied today by her sister, Janet Choi.  The patient's last visit with [place] was on 10/18/19. The pt reports that she is doing well overall.  The pt reports she is good. She is having a plan for getting scans or her gallbladder and possibly surgery. She is still having diarrhea and pain over right upper quadrant. Pt is tolerating iron supplement well. She has only been using her inhaler only for a few months.   Lab results today (11/05/19) of CBC w/diff and CMP is as  follows: all values are WNL except for WBC at 2.2K, RBC at 3.17, Hemoglobin at 10.4, HCT at 31.8, MCV at 100.3, Platelets at 136K, Neutro Abs at 1206, Lymph Abs at 812, Absolute Monocytes at 81, Glucose at 281, Creatinine at 1.40, , AST at 9, GFR, Est Non Af Am at 36, GFR, Est AFR Am at 42, AST at 6 10/18/19 of LDH at 172: WNL 10/18/19 of MMP is as follows: all values are WNL except for IgG, Serum at 396, Total Protein ELP at 5.9 10/18/19 of Folate RBC is as follows: all values are WNL except for Hematocrit at 28.2 10/18/19 of C-Reactive Protein at 0.9: WNL 10/18/19 of Sedimentation Rate at 52 10/18/19 of TSH at 1.257: WNL 10/18/19 of Copper at 142: WNL 10/18/19 of Kappa/Lambda Light Chains is as follows: all values are WNL except for Kappa Free Light Chain at 31.1 10/18/19 of Vitamin B12 at 523: WNL 10/18/19 of Haptoglobin at 155: WNL   On review of systems, pt reports diarrhea, RUQ pain and denies any other symptoms.   MEDICAL HISTORY:  Past Medical History:  Diagnosis Date  . Anemia    hx (04/14/2016)  . Anxiety   . Arthritis    "severe in my back; hands; ankles" (04/14/2016)  . Atypical chest pain 01/24/2018   Midline pain absent supine "constant" daytime since 12/31/17 > resolved as of 02/20/2018 on gerd/ gas diet   . Basal cell carcinoma    "several burned off; one cut off"  . CAD in native artery    a. NSTEMI 04/2016 - s/p DES toLAD and LCx. PCI to LCx notable for microembolization during cath.  Marland Kitchen  Chest pain- reslved with stopping Brilinta now on Plaix 04/16/2016  . Chronic lower back pain   . CKD (chronic kidney disease), stage III   . Depression   . Diverticulosis   . DJD (degenerative joint disease)   . Family history of adverse reaction to anesthesia    "half-sister used to get real sick" (04/14/2016)  . Gallstones   . GERD (gastroesophageal reflux disease)   . History of gout   . Hyperlipidemia   . Hypertension   . IBS (irritable bowel syndrome)   . Ischemic  cardiomyopathy    a. 04/2016: EF 40-50% by cath, 50-55% +WMA by echo.  . 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 ([place]) ~ 2015  . Morbid obesity ([place])   . NSTEMI (non-ST elevated myocardial infarction) ([place]) 04/14/2016  . OSA on CPAP   . Peripheral neuropathy   . Peripheral vascular disease ([place])   . RLS (restless legs syndrome)   . Spinal stenosis   . Type II diabetes mellitus ([place])   . Vitamin D deficiency      SURGICAL HISTORY: Past Surgical History:  Procedure Laterality Date  . APPENDECTOMY    . BASAL CELL CARCINOMA EXCISION Left    leg  . CARDIAC CATHETERIZATION N/A 04/15/2016   Procedure: Left Heart Cath and Coronary Angiography;  Surgeon: Belva Crome, MD;  Location: [facility];  Service: Cardiovascular;  Laterality: N/A;  . CARDIAC CATHETERIZATION N/A 04/15/2016   Procedure: Coronary Stent Intervention;  Surgeon: Belva Crome, MD;  Location: [facility];  Service: Cardiovascular;  Laterality: N/A;  Mid LAD Mid CFX  . CARPAL TUNNEL RELEASE Right    with trigger finger release  . CATARACT EXTRACTION, BILATERAL Bilateral   . DEBRIDEMENT TENNIS ELBOW Right   . LUMBAR DISC SURGERY  X 2  . MELANOMA EXCISION Left    "towards the back of my neck"  . SHOULDER ARTHROSCOPY W/ ROTATOR CUFF REPAIR Left   . SHOULDER OPEN ROTATOR CUFF REPAIR Right   . TOTAL KNEE ARTHROPLASTY Bilateral      SOCIAL HISTORY: Social History   Socioeconomic History  . Marital status: Widowed    Spouse name: Not on file  . Number of children: 1  . Years of education: Not on file  . Highest education level: Not on file  Occupational History  . Occupation: retired  Tobacco Use  . Smoking status: Former Smoker    Packs/day: 2.50    Years: 25.00    Pack years: 62.50    Types: Cigarettes    Quit date: 11/11/1983    Years since quitting: 36.0  . Smokeless tobacco: Never Used  Vaping Use  . Vaping Use: Never used  Substance and Sexual Activity  . Alcohol use: No    Alcohol/week:  0.0 standard drinks  . Drug use: No  . Sexual activity: Never  Other Topics Concern  . Not on file  Social History Narrative  . Not on file   Social Determinants of Health   Financial Resource Strain:   . Difficulty of Paying Living Expenses:   Food Insecurity:   . Worried About Charity fundraiser in the Last Year:   . Arboriculturist in the Last Year:   Transportation Needs:   . Film/video editor (Medical):   Marland Kitchen Lack of Transportation (Non-Medical):   Physical Activity:   . Days of Exercise per Week:   . Minutes of Exercise per Session:   Stress:   .  Feeling of Stress :   Social Connections:   . Frequency of Communication with Friends and Family:   . Frequency of Social Gatherings with Friends and Family:   . Attends Religious Services:   . Active Member of Clubs or Organizations:   . Attends Archivist Meetings:   Marland Kitchen Marital Status:   Intimate Partner Violence:   . Fear of Current or Ex-Partner:   . Emotionally Abused:   Marland Kitchen Physically Abused:   . Sexually Abused:      FAMILY HISTORY: Family History  Problem Relation Age of Onset  . Heart disease Mother   . Heart attack Mother   . Kidney disease Father   . AAA (abdominal aortic aneurysm) Father   . Parkinson's disease Father   . Cancer Brother        type unknown  . Heart disease Son   . Cancer Sister        female   . Alzheimer's disease Sister   . Colon cancer Neg Hx   . Colon polyps Neg Hx   . Esophageal cancer Neg Hx   . Pancreatic cancer Neg Hx   . Stomach cancer Neg Hx   . Liver disease Neg Hx   . Diabetes Neg Hx      ALLERGIES:   is allergic to brilinta [ticagrelor], aspirin, nsaids, minocycline hcl, oruvail [ketoprofen], tricor [fenofibrate], vasotec [enalaprilat], and zinc.   MEDICATIONS:  Current Outpatient Medications  Medication Sig Dispense Refill  . acetaminophen (TYLENOL) 325 MG tablet Take 2 tablets (650 mg total) by mouth every 4 (four) hours as needed for headache or  mild pain.    Marland Kitchen albuterol (VENTOLIN HFA) 108 (90 Base) MCG/ACT inhaler Inhale 2 puffs into the lungs every 4 (four) hours as needed for wheezing or shortness of breath. 18 g 3  . aspirin EC 81 MG EC tablet Take 1 tablet (81 mg total) by mouth daily.    Marland Kitchen atorvastatin (LIPITOR) 80 MG tablet TAKE 1 TABLET (80 MG TOTAL) BY MOUTH DAILY AT 6 PM. (Patient taking differently: Take 80 mg by mouth every evening. ) 90 tablet 2  . Budeson-Glycopyrrol-Formoterol (BREZTRI AEROSPHERE) 160-9-4.8 MCG/ACT AERO Inhale 2 puffs into the lungs in the morning and at bedtime. 5.9 g 2  . buPROPion (WELLBUTRIN XL) 300 MG 24 hr tablet Take 1 tablet (300 mg total) by mouth daily. Take 1 tablet Daily for Mood 90 tablet 1  . Cholecalciferol ([employer] VIT D 5000 HIGH-POTENCY) 5000 units capsule Take 5,000 Units by mouth 2 (two) times daily.     . Continuous Blood Gluc Receiver (FREESTYLE LIBRE 14 DAY READER) DEVI 1 Device by Does not apply route every 14 (fourteen) days. 2 each 4  . Continuous Blood Gluc Sensor (FREESTYLE LIBRE 14 DAY SENSOR) MISC Apply 1 application topically every 14 (fourteen) days. 2 each 4  . diclofenac sodium (VOLTAREN) 1 % GEL Apply 4 g topically 4 (four) times daily. (Patient taking differently: Apply 4 g topically daily as needed (For had pain). ) 100 g 3  . escitalopram (LEXAPRO) 20 MG tablet Take 1 tablet (20 mg total) by mouth daily. (Patient taking differently: Take 30 mg by mouth daily. ) 90 tablet 3  . ferrous sulfate 325 (65 FE) MG tablet Take 325 mg by mouth daily with breakfast.    . furosemide (LASIX) 40 MG tablet Take 40 mg by mouth as needed for fluid.    Marland Kitchen gabapentin (NEURONTIN) 100 MG capsule Take 1 to 2 capsules  3 to 4 x /day as needed for Painful Diabetic Neuropathy (Patient taking differently: Take 200 mg by mouth in the morning, at noon, in the evening, and at bedtime. ) 360 capsule 3  . insulin aspart (NOVOLOG FLEXPEN) 100 UNIT/ML FlexPen Inject 50  to 75 units 3 x /day before meals as  directed for Diabetes (Patient taking differently: Inject 20 Units into the skin 3 (three) times daily with meals. Inject 50  to 75 units 3 x /day before meals as directed for Diabetes) 30 mL 3  . Insulin Glargine (BASAGLAR KWIKPEN) 100 UNIT/ML SOPN Inject 70 units Daily for Diabetes (Patient taking differently: Inject 70 Units into the skin at bedtime. Inject 70 units Daily for Diabetes) 20 pen 3  . Insulin Pen Needle (BD PEN NEEDLE NANO U/F) 32G X 4 MM MISC USE AS DIRECTED 4 TIMES A DAY 400 each 3  . ketoconazole (NIZORAL) 2 % cream Apply 1 application topically 2 (two) times daily. 15 g 0  . Lancets (ONETOUCH ULTRASOFT) lancets Use as instructed 100 each 12  . liraglutide (VICTOZA) 18 MG/3ML SOPN Inject 0.2 mLs (1.2 mg total) into the skin every morning. 6 mL 3  . losartan (COZAAR) 50 MG tablet Take 1 tablet Daily for BP & Diabetic Kidney Protection 90 tablet 3  . metoprolol succinate (TOPROL-XL) 25 MG 24 hr tablet Take 1 tablet (25 mg total) by mouth at bedtime. 90 tablet 3  . nitroGLYCERIN (NITROSTAT) 0.4 MG SL tablet Place 1 tablet (0.4 mg total) under the tongue every 5 (five) minutes x 3 doses as needed for chest pain. 25 tablet 4  . ONETOUCH VERIO test strip USE AS INSTRUCTED TO CHECK SUGAR 2 TIMES DAILY. 200 each 4  . pantoprazole (PROTONIX) 40 MG tablet Take 1 tablet (40 mg total) by mouth 2 (two) times daily before a meal. 180 tablet 1  . Polyethylene Glycol 400 (BLINK TEARS OP) Place 1 drop into both eyes daily as needed.    Marland Kitchen rOPINIRole (REQUIP) 3 MG tablet Take 1 tablet 4 x /day for Restless Legs (Patient taking differently: Take 3 mg by mouth in the morning, at noon, in the evening, and at bedtime. ) 360 tablet 3  . tiZANidine (ZANAFLEX) 4 MG tablet Take 1 tablet at Bedtime as needed for Muscle Spasms 90 tablet 1  . vitamin B-12 (CYANOCOBALAMIN) 1000 MCG tablet Take 1,000 mcg by mouth daily.     No current facility-administered medications for this visit.   REVIEW OF SYSTEMS:   A  10+ POINT REVIEW OF SYSTEMS WAS OBTAINED including neurology, dermatology, psychiatry, cardiac, respiratory, lymph, extremities, GI, GU, Musculoskeletal, constitutional, breasts, reproductive, HEENT.  All pertinent positives are noted in the HPI.  All others are negative.   PHYSICAL EXAMINATION: ECOG PERFORMANCE STATUS: 2 - Symptomatic, <50% confined to bed  There were no vitals filed for this visit. There were no vitals filed for this visit. There is no height or weight on file to calculate BMI.  Tele-Health Visit   LABORATORY DATA:  I have reviewed the data as listed  CBC Latest Ref Rng & Units 11/05/2019 10/18/2019 10/18/2019  WBC 3.8 - 10.8 Thousand/uL 2.2(L) 2.5(L) -  Hemoglobin 11.7 - 15.5 g/dL 10.4(L) 9.0(L) -  Hematocrit 35 - 45 % 31.8(L) 28.2(L) 29.7(L)  Platelets 140 - 400 Thousand/uL 136(L) 140(L) -   . CBC    Component Value Date/Time   WBC 2.2 (L) 11/05/2019 1442   RBC 3.17 (L) 11/05/2019 1442   HGB 10.4 (  L) 11/05/2019 1442   HGB 9.1 (L) 05/15/2009 1032   HCT 31.8 (L) 11/05/2019 1442   HCT 28.2 (L) 10/18/2019 1233   HCT 31.6 (L) 05/15/2009 1032   PLT 136 (L) 11/05/2019 1442   PLT 243 05/15/2009 1032   MCV 100.3 (H) 11/05/2019 1442   MCV 78.8 (L) 05/15/2009 1032   MCH 32.8 11/05/2019 1442   MCHC 32.7 11/05/2019 1442   RDW 14.3 11/05/2019 1442   RDW 17.2 (H) 05/15/2009 1032   LYMPHSABS 812 (L) 11/05/2019 1442   LYMPHSABS 1.2 05/15/2009 1032   MONOABS 0.1 10/18/2019 1232   MONOABS 0.3 05/15/2009 1032   EOSABS 101 11/05/2019 1442   EOSABS 0.1 05/15/2009 1032   BASOSABS 0 11/05/2019 1442   BASOSABS 0.0 05/15/2009 1032     CMP Latest Ref Rng & Units 11/05/2019 10/18/2019 10/15/2019  Glucose 65 - 99 mg/dL 281(H) 279(H) 107(H)  BUN 7 - 25 mg/dL 21 21 23   Creatinine 0.60 - 0.93 mg/dL 1.40(H) 1.39(H) 1.30(H)  Sodium 135 - 146 mmol/L 140 139 141  Potassium 3.5 - 5.3 mmol/L 4.7 4.2 4.6  Chloride 98 - 110 mmol/L 103 109 106  CO2 20 - 32 mmol/L 28 20(L) 28  Calcium  8.6 - 10.4 mg/dL 9.2 8.7(L) 9.1  Total Protein 6.1 - 8.1 g/dL 6.1 6.3(L) 5.8(L)  Total Bilirubin 0.2 - 1.2 mg/dL 0.5 0.4 0.5  Alkaline Phos 38 - 126 U/L - 90 -  AST 10 - 35 U/L 6(L) 9(L) 8(L)  ALT 6 - 29 U/L 8 8 6     RADIOGRAPHIC STUDIES: I have personally reviewed the radiological images as listed and agreed with the findings in the report. US Abdomen Complete  Result Date: 10/18/2019 CLINICAL DATA:  Right upper quadrant pain EXAM: ABDOMEN ULTRASOUND COMPLETE COMPARISON:  CT from 04/22/2016 FINDINGS: Gallbladder: Gallbladder is partially distended with mild wall thickening likely related to incomplete distension. Cholelithiasis is identified. Positive sonographic Murphy's sign is noted. Common bile duct: Diameter: 5.5 mm. Liver: Mild heterogeneity without focal mass. Portal vein is patent on color Doppler imaging with normal direction of blood flow towards the liver. IVC: No abnormality visualized. Pancreas: Visualized portion unremarkable. Spleen: Size and appearance within normal limits. Right Kidney: Length: 9.9 cm. Echogenicity within normal limits. No mass or hydronephrosis visualized. Left Kidney: Length: 9.6 cm. No mass lesion or hydronephrosis is noted. 2.6 cm cyst is noted in the lower pole. There are scattered echogenicities consistent with nonobstructing stones. Abdominal aorta: No aneurysm visualized. Other findings: None. IMPRESSION: Cholelithiasis with positive sonographic Murphy's sign and mild wall thickening these changes would be consistent with acute cholecystitis in the appropriate clinical setting. HIDA scan may be helpful for further evaluation. Left renal calculi without obstructive changes. Left renal cyst. Electronically Signed   By: Inez Catalina M.D.   On: 10/18/2019 08:19   SLEEP STUDY DOCUMENTS  Result Date: 11/02/2019 Ordered by an unspecified provider.    ASSESSMENT & PLAN:   AMMARIE MATSUURA is a 79 y.o. female with:  1. Macrocytic Anemia 2. 2.  Thrombocytopenia 3. Leucopenia/Neutropenia  PLAN: -Discussed pt labwork today, 11/05/19; of CBC w/diff and CMP is as follows: all values are WNL except for WBC at 2.2K, RBC at 3.17, Hemoglobin at 10.4, HCT at 31.8, MCV at 100.3, Platelets at 136K, Neutro Abs at 1206, Lymph Abs at 812, Absolute Monocytes at 81, Glucose at 281, Creatinine at 1.40, , AST at 9, GFR, Est Non Af Am at 36, GFR, Est AFR Am at 42,  AST at 6 -Discussed 10/18/19 of LDH at 172: WNL -Discussed 10/18/19 of MMP is as follows: all values are WNL except for IgG, Serum at 396, Total Protein ELP at 5.9 -Discussed 10/18/19 of Folate RBC is as follows: all values are WNL except for Hematocrit at 28.2 -Discussed 10/18/19 of C-Reactive Protein at 0.9: WNL -Discussed 10/18/19 of Sedimentation Rate at 52 -Discussed 10/18/19 of TSH at 1.257: WNL -Discussed 10/18/19 of Copper at 142: WNL -Discussed 10/18/19 of Kappa/Lambda Light Chains is as follows: all values are WNL except for Kappa Free Light Chain at 31.1 -Discussed 10/18/19 of Vitamin B12 at 523: WNL -Discussed 10/18/19 of Haptoglobin at 155: WNL -Advised hemoglobin needs to be at least 10 for surgery  -Advised hemoglobin is at 10.4 and reasonable to proceed with surgery  -Advised on IV iron instead of oral iron supplement -replace to optimize levels  -Recommends continue iron supplement twice a day -Advised unlikely bone marrow problem  -Advised acute cholecystitis -continue to f/u with PCP -Advised on Requip medication and sleep apnea  -Advised keeping iron levels higher (ferritin>100) -Advised on multi-factor involvement -medication, inflammation, deficiencies  -Advised on rechecking IgG levels after gallbladder surgery -possibility of seeing immunologist if levels remain low  -Advised on hypogammaglobulinemia- will recheck in 3 months after chronic inflammation is addressed  -Recommends staying hydrated  -Recommends continue 1000 mcg of Vitamin B12 once a day, Vitamin D and  iron supplements  -Recommends taking OTC 1 capsule of vitamin B-complex daily  -Continue f/u with GI  -Continue f/u with PCP -Will get IV injectafer x 2 doses  -Will see back in 3 months  FOLLOW UP IV Injectafer weekly x 2 doses starting ASAP RTC with Dr [name] in 3 months with labs   No orders of the defined types were placed in this encounter.   The total time spent in the appt was 20 minutes and more than 50% was on counseling and direct patient cares.  All of the patient's questions were answered with apparent satisfaction. The patient knows to call the clinic with any problems, questions or concerns.  Sullivan Lone MD [place] AAHIVMS [facility] Hematology/Oncology Physician [facility]  (Office):       [phone] (Work cell):  [phone] (Fax):           [phone]  11/08/2019 10:27 AM I, Dawayne Cirri am acting as a scribe for Dr. Sullivan Lone.   .I have reviewed the above documentation for accuracy and completeness, and I agree with the above. Brunetta Genera MD

## 2019-11-08 ENCOUNTER — Other Ambulatory Visit: Payer: Self-pay

## 2019-11-08 ENCOUNTER — Inpatient Hospital Stay: Payer: PPO | Attending: Hematology | Admitting: Hematology

## 2019-11-08 DIAGNOSIS — D649 Anemia, unspecified: Secondary | ICD-10-CM

## 2019-11-08 DIAGNOSIS — D801 Nonfamilial hypogammaglobulinemia: Secondary | ICD-10-CM

## 2019-11-08 DIAGNOSIS — D696 Thrombocytopenia, unspecified: Secondary | ICD-10-CM | POA: Diagnosis not present

## 2019-11-08 DIAGNOSIS — D709 Neutropenia, unspecified: Secondary | ICD-10-CM | POA: Diagnosis not present

## 2019-11-08 DIAGNOSIS — D72819 Decreased white blood cell count, unspecified: Secondary | ICD-10-CM | POA: Insufficient documentation

## 2019-11-08 DIAGNOSIS — D509 Iron deficiency anemia, unspecified: Secondary | ICD-10-CM | POA: Insufficient documentation

## 2019-11-12 DIAGNOSIS — G4733 Obstructive sleep apnea (adult) (pediatric): Secondary | ICD-10-CM

## 2019-11-12 DIAGNOSIS — J449 Chronic obstructive pulmonary disease, unspecified: Secondary | ICD-10-CM | POA: Diagnosis not present

## 2019-11-12 NOTE — Procedures (Signed)
    Patient Name: Janet Choi, Janet Choi Date: 10/31/2019 Gender: Female D.O.B: 04-Jun-1940 Age (years): 78 Referring Provider: Baltazar Apo Height (inches): 61 Interpreting Physician: Chesley Mires MD, ABSM Weight (lbs): 243 RPSGT: Carolin Coy BMI: 60 MRN: [id number] Neck Size: 16.50  CLINICAL INFORMATION Sleep Study Type: NPSG  Indication for sleep study: Diabetes, Hypertension, Obesity, OSA, Snoring  Epworth Sleepiness Score: 14  Most recent polysomnogram dated 09/19/2011 revealed an AHI of 38.5/h. Most recent titration study dated 09/19/2011 was optimal at 13cm H2O with an AHI of 0.0/h.  SLEEP STUDY TECHNIQUE As per the AASM Manual for the Scoring of Sleep and Associated Events v2.3 (April 2016) with a hypopnea requiring 4% desaturations.  The channels recorded and monitored were frontal, central and occipital EEG, electrooculogram (EOG), submentalis EMG (chin), nasal and oral airflow, thoracic and abdominal wall motion, anterior tibialis EMG, snore microphone, electrocardiogram, and pulse oximetry.  MEDICATIONS Medications self-administered by patient taken the night of the study : LIPITOR, BASAGLAR, GABAPENTIN, PROTONIX, REQUIP, VITAMIN D, FERROUS SULFATE  SLEEP ARCHITECTURE The study was initiated at 11:14:43 PM and ended at 5:07:52 AM.  Sleep onset time was 44.4 minutes and the sleep efficiency was 74.6%%. The total sleep time was 263.5 minutes.  Stage REM latency was 182.5 minutes.  The patient spent 16.3%% of the night in stage N1 sleep, 74.4%% in stage N2 sleep, 0.0%% in stage N3 and 9.3% in REM.  Alpha intrusion was absent.  Supine sleep was 0.00%.  RESPIRATORY PARAMETERS The overall apnea/hypopnea index (AHI) was 52.1 per hour. There were 34 total apneas, including 34 obstructive, 0 central and 0 mixed apneas. There were 195 hypopneas and 8 RERAs.  The AHI during Stage REM sleep was 61.2 per hour.  AHI while supine was N/A per hour.  The mean oxygen  saturation was 95.2%. The minimum SpO2 during sleep was 78.0%.  loud snoring was noted during this study.  CARDIAC DATA The 2 lead EKG demonstrated sinus rhythm. The mean heart rate was 65.8 beats per minute. Other EKG findings include: None.  LEG MOVEMENT DATA The total PLMS were 0 with a resulting PLMS index of 0.0. Associated arousal with leg movement index was 0.0 .  IMPRESSIONS - Severe obstructive sleep apnea with an AHI of 52.1 and SpO2 low of 78%. - She did not qualify for split night sleep study protocol due to insufficient sleep during the first half of the study.  DIAGNOSIS - Obstructive Sleep Apnea (327.23 [G47.33 ICD-10])  RECOMMENDATIONS - Given the severity of her sleep apnea she should be tried on CPAP therapy first.  Given her history of COPD and significant oxygen desaturation during this study, she should be referred back to the sleep lab for a CPAP titration study. - She should receive education reviewing options to assist with weight loss.  [Electronically signed] 11/12/2019 11:43 AM  Chesley Mires MD, ABSM Diplomate, American Board of Sleep Medicine   NPI: [id number]

## 2019-11-13 ENCOUNTER — Telehealth: Payer: Self-pay | Admitting: *Deleted

## 2019-11-13 ENCOUNTER — Ambulatory Visit: Payer: Self-pay | Admitting: General Surgery

## 2019-11-13 DIAGNOSIS — K529 Noninfective gastroenteritis and colitis, unspecified: Secondary | ICD-10-CM | POA: Diagnosis not present

## 2019-11-13 DIAGNOSIS — I252 Old myocardial infarction: Secondary | ICD-10-CM | POA: Diagnosis not present

## 2019-11-13 DIAGNOSIS — G4733 Obstructive sleep apnea (adult) (pediatric): Secondary | ICD-10-CM | POA: Diagnosis not present

## 2019-11-13 DIAGNOSIS — I10 Essential (primary) hypertension: Secondary | ICD-10-CM | POA: Diagnosis not present

## 2019-11-13 DIAGNOSIS — K8020 Calculus of gallbladder without cholecystitis without obstruction: Secondary | ICD-10-CM | POA: Diagnosis not present

## 2019-11-13 DIAGNOSIS — D61818 Other pancytopenia: Secondary | ICD-10-CM | POA: Diagnosis not present

## 2019-11-13 NOTE — Telephone Encounter (Signed)
   [facility] Pre-operative Risk Assessment    HEARTCARE STAFF: - Please ensure there is not already an duplicate clearance open for this procedure. - Under Visit Info/Reason for Call, type in Other and utilize the format Clearance MM/DD/YY or Clearance TBD. Do not use dashes or single digits. - If request is for dental extraction, please clarify the # of teeth to be extracted.  Request for surgical clearance:  1. What type of surgery is being performed? LAPAROSCOPIC CHOLECYSTECTOMY  2. When is this surgery scheduled? TBD  3. What type of clearance is required (medical clearance vs. Pharmacy clearance to hold med vs. Both)?MEDICAL   4. Are there any medications that need to be held prior to surgery and how long?NONE  5. Practice name and name of physician performing surgery? [facility]  6. What is the office phone number? [phone]   7.   What is the office fax number? [phone]  8.   Anesthesia type (None, local, MAC, general) ? GENERAL   Fredia Beets 11/13/2019, 4:33 PM  _________________________________________________________________   (provider comments below)

## 2019-11-13 NOTE — H&P (Signed)
Janet Choi Appointment: 11/13/2019 1:30 PM Location: [facility] Patient #: [id number] DOB: [birth year] Widowed / Language: Cleophus Molt / Race: White Female   History of Present Illness Randall Hiss M. Aamiyah Derrick MD; 11/13/2019 2:10 PM) The patient is a 79 year old female who presents for evaluation of gall stones.She is referred by Dr Melford Aase for evaluation of abdominal pain and gallstones. She is accompanied by her sister. She reports a two-month history of pain in her upper abdomen generally after eating food. She cannot nailed down specific foods because it'll vary; however, the pain generally occurs after eating. It'll last several hours. It is often associated with nausea and burping. Also has increased flatus. Does not radiate. Hasn't really tried anything for it other than some Imodium with no change. Her last episode was this past Sunday after eating some potato salad and a few bites of the hot dog. Pain started around 5:30 the evening and lasted until midnight. It did not radiate. She saw her PCP and underwent an abdominal ultrasound which showed a contracted gallbladder with gallstones and some mild wall thickening. Normal common bile duct. She is scheduled to undergo a nuclear medicine scan later this month. She also has pancytopenia. Her hemoglobin and hematocrit are down from about a year ago along with a decreasing white blood cell count. She is been seen hematology. I reviewed their note from July 1. He felt that she was cleared for gallbladder surgery because her hemoglobin was 10. I also reviewed her PCPs note from the end of June of this year. Reviewed lab trends. White count was 2.5, hemoglobin 10, hematocrit 32, platelet count 140. Also reviewed a cardiac stress test March 2021 showed a small focus of mild ischemia in the anterior septal wall, no evidence of reversible ischemia. Normal LV motion, left ventricular ejection fraction 56%, low to intermediate risk.  She  also reports a several year history of intermittent loose stools.   Problem List/Past Medical Randall Hiss M. 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 Knee Surgery  Bilateral. Shoulder Surgery  Bilateral. Spinal Surgery - Lower Back  Valve Replacement   Diagnostic Studies History (Chanel Teressa Senter, CMA; 11/13/2019 1:25 PM) Colonoscopy  >10 years ago  Allergies (Chanel Teressa Senter, CMA; 11/13/2019 1:27 PM) Brilinta *HEMATOLOGICAL AGENTS - MISC.*  Aspirin *ANALGESICS - NonNarcotic*  NSAIDs  Minocycline HCl *TETRACYCLINES*  Oruvail *ANALGESICS - ANTI-INFLAMMATORY*  Tricor *ANTIHYPERLIPIDEMICS*  Vasotec *ANTIHYPERTENSIVES*  Zinc *MINERALS & ELECTROLYTES*  Allergies Reconciled   Medication History (Chanel Nolan, CMA; 11/13/2019 1:28 PM) Albuterol Sulfate HFA (108 (90 Base)MCG/ACT Aerosol Soln, Inhalation) Active. Atorvastatin Calcium (80MG  Tablet, Oral) Active. buPROPion HCl ER (XL) (300MG  Tablet ER 24HR, Oral) Active. Escitalopram Oxalate (10MG  Tablet, Oral) Active. Gabapentin (100MG  Capsule, Oral) Active. NovoLOG FlexPen (100UNIT/ML Soln Pen-inj, Subcutaneous) Active. Basaglar KwikPen (100UNIT/ML Soln Pen-inj, Subcutaneous) Active. Victoza (18MG /3ML Soln Pen-inj, Subcutaneous) Active. Losartan Potassium (50MG  Tablet, Oral) Active. Metoprolol Succinate ER (25MG  Tablet ER 24HR, Oral) Active. Pantoprazole Sodium (40MG  Tablet DR, Oral) Active. rOPINIRole HCl (3MG  Tablet, Oral) Active. Medications Reconciled  Social History (Chanel Teressa Senter, CMA; 11/13/2019 1:25 PM) Caffeine use  Carbonated beverages, Coffee, Tea. No alcohol use  No drug use  Tobacco use  Former smoker.  Family History ([place], [place]; 11/13/2019 1:25 PM) Anesthetic complications   Sister. Cancer  Brother. Depression  Mother, Son. Heart Disease  Mother. Heart disease in female family member before age 59  Hypertension  Sister. Kidney Disease  Brother, Father.  Pregnancy / Birth History Antonietta Jewel, [place]; 11/13/2019 1:25 PM) Age at menarche  72 years. Age of menopause  35-50 Contraceptive History  Oral contraceptives. Gravida  1 Maternal age  47-30  Other Problems Leighton Ruff. Redmond Pulling, MD; 11/13/2019 2:10 PM) Anxiety Disorder  Back Pain  Chest pain  Diabetes Mellitus  Gastroesophageal Reflux Disease  Hypercholesterolemia  Melanoma  Myocardial infarction  Sleep Apnea  High blood pressure     Review of Systems (Chanel Nolan CMA; 11/13/2019 1:25 PM) General Present- Fatigue. Not Present- Appetite Loss, Chills, Fever, Night Sweats, Weight Gain and Weight Loss. Respiratory Present- Snoring and Wheezing. Not Present- Bloody sputum, Chronic Cough and Difficulty Breathing. Breast Not Present- Breast Mass, Breast Pain, Nipple Discharge and Skin Changes. Female Genitourinary Not Present- Frequency, Nocturia, Painful Urination, Pelvic Pain and Urgency. Musculoskeletal Present- Joint Stiffness. Not Present- Back Pain, Joint Pain, Muscle Pain, Muscle Weakness and Swelling of Extremities. Neurological Present- Weakness. Not Present- Decreased Memory, Fainting, Headaches, Numbness, Seizures, Tingling, Tremor and Trouble walking. Endocrine Not Present- Cold Intolerance, Excessive Hunger, Hair Changes, Heat Intolerance, Hot flashes and New Diabetes. Hematology Present- Blood Thinners. Not Present- Easy Bruising, Excessive bleeding, Gland problems, HIV and Persistent Infections.  Vitals (Chanel Nolan CMA; 11/13/2019 1:28 PM) 11/13/2019 1:28 PM Weight: 246 lb Height: 61.5in Body Surface Area: 2.08 m Body Mass Index: 45.73 kg/m  Temp.: 93.51F  Pulse: 86 (Regular)  BP: 126/78(Sitting, Left Arm, Standard)       Physical Exam Randall Hiss M. Makaylie Dedeaux MD;  11/13/2019 2:06 PM) General Mental Status-Alert. General Appearance-Consistent with stated age. Hydration-Well hydrated. Voice-Normal. Note: severe obesity; truncal   Head and Neck Head-normocephalic, atraumatic with no lesions or palpable masses. Trachea-midline. Thyroid Gland Characteristics - normal size and consistency.  Eye Eyeball - Bilateral-Extraocular movements intact. Sclera/Conjunctiva - Bilateral-No scleral icterus.  Chest and Lung Exam Chest and lung exam reveals -quiet, even and easy respiratory effort with no use of accessory muscles and on auscultation, normal breath sounds, no adventitious sounds and normal vocal resonance. Inspection Chest Wall - Normal. Back - normal.  Breast - Did not examine.  Cardiovascular Cardiovascular examination reveals -normal heart sounds, regular rate and rhythm with no murmurs and normal pedal pulses bilaterally.  Abdomen Inspection Inspection of the abdomen reveals - No Hernias. Skin - Scar - no surgical scars. Palpation/Percussion Palpation and Percussion of the abdomen reveal - Soft, Non Tender, No Rebound tenderness, No Rigidity (guarding) and No hepatosplenomegaly. Auscultation Auscultation of the abdomen reveals - Bowel sounds normal.  Peripheral Vascular Upper Extremity Palpation - Pulses bilaterally normal.  Neurologic Neurologic evaluation reveals -alert and oriented x 3 with no impairment of recent or remote memory. Mental Status-Normal.  Neuropsychiatric The patient's mood and affect are described as -normal. Judgment and Insight-insight is appropriate concerning matters relevant to self.  Musculoskeletal Normal Exam - Left-Upper Extremity Strength Normal and Lower Extremity Strength Normal. Normal Exam - Right-Upper Extremity Strength Normal and Lower Extremity Strength Normal.  Lymphatic Head & Neck  General Head & Neck Lymphatics: Bilateral - Description -  Normal. Axillary - Did not examine. Femoral & Inguinal - Did not examine.    Assessment & Plan Randall Hiss M. 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  We discussed gallbladder disease. The patient was given Neurosurgeon. We discussed non-operative and operative management. We  discussed the signs & symptoms of acute cholecystitis  I discussed laparoscopic cholecystectomy with IOC in detail. The patient was given educational material as well as diagrams detailing the procedure. We discussed the risks and benefits of a laparoscopic cholecystectomy including, but not limited to bleeding, infection, injury to surrounding structures such as the intestine or liver, bile leak, retained gallstones, need to convert to an open procedure, prolonged diarrhea, blood clots such as DVT, common bile duct injury, anesthesia risks, and possible need for additional procedures. We discussed the typical post-operative recovery course. I explained that the likelihood of improvement of their symptoms (postprandial upper abd pain) is good. Her severe obesity, cardiac history, and pancytopenia, and obstructive sleep apnea increase her risk for surgery compared to the average patient however they are not risk prohibitive  The patient has elected to proceed with surgery.  This is a moderately complex patient requiring a moderate level of data review as well as discussion regarding surgery with identified patient risk factors Current Plans Pt Education - Pamphlet Given - Laparoscopic Gallbladder Surgery: discussed with patient and provided information. I recommended obtaining preoperative cardiac clearance. I am concerned about the health of the patient and the ability to tolerate the operation. Therefore, we will request clearance by cardiology to better assess operative risk & see if a reevaluation,  further workup, etc is needed. Also recommendations on how medications such as for anticoagulation and blood pressure should be managed/held/restarted after surgery. PANCYTOPENIA [phone]) Impression: told pt and sister that gb surgery would not improve her cbc. still needs to proceed with gi and hematology workup ESSENTIAL HYPERTENSION (I10) Impression: will need cardiac clearance. 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 Redmond Pulling, MD, FACS General, Bariatric, & Minimally Invasive Surgery [facility], [place]

## 2019-11-14 ENCOUNTER — Telehealth: Payer: Self-pay | Admitting: Emergency Medicine

## 2019-11-14 ENCOUNTER — Telehealth: Payer: Self-pay | Admitting: Hematology

## 2019-11-14 DIAGNOSIS — J449 Chronic obstructive pulmonary disease, unspecified: Secondary | ICD-10-CM

## 2019-11-14 NOTE — Telephone Encounter (Signed)
Scheduled per los, patient has been called and voicemail was left. 

## 2019-11-14 NOTE — Telephone Encounter (Signed)
Left message for the patient to call back and speak to the on-call preop APP of the day 

## 2019-11-14 NOTE — Telephone Encounter (Signed)
I have called this patient she wants to use adapt but im not sure she has got the results yet since there are no orders

## 2019-11-14 NOTE — Telephone Encounter (Signed)
I can not find where the sleep study has been read as of yet.   I have left message for patient to follow up.

## 2019-11-14 NOTE — Telephone Encounter (Addendum)
   Primary Cardiologist: Skeet Latch, MD  Chart reviewed as part of pre-operative protocol coverage. Patient was contacted 11/14/2019 in reference to pre-operative risk assessment for pending surgery as outlined below.  MARIEN MANSHIP was last seen on 09/03/2019 by Coletta Memos NP.  Since that day, SADEEL FIDDLER has done well without chest pain or worsening dyspnea. Recent myoview in March 2021 was reassuring.   Therefore, based on ACC/AHA guidelines, the patient would be at acceptable risk for the planned procedure without further cardiovascular testing.   I will route this recommendation to the requesting party via Epic fax function and remove from pre-op pool. Please call with questions.  [place], [place] 11/14/2019, 11:08 AM

## 2019-11-15 NOTE — Telephone Encounter (Signed)
LMTCB x 2 for the pt and in the meantime will route to RB to see if he has seen the sleep study results yet, thanks

## 2019-11-16 ENCOUNTER — Other Ambulatory Visit: Payer: Self-pay | Admitting: Internal Medicine

## 2019-11-16 ENCOUNTER — Other Ambulatory Visit: Payer: Self-pay | Admitting: Physician Assistant

## 2019-11-16 DIAGNOSIS — J0100 Acute maxillary sinusitis, unspecified: Secondary | ICD-10-CM

## 2019-11-17 ENCOUNTER — Other Ambulatory Visit: Payer: Self-pay

## 2019-11-17 ENCOUNTER — Inpatient Hospital Stay: Payer: PPO

## 2019-11-17 VITALS — BP 131/57 | HR 65 | Temp 98.8°F | Resp 20

## 2019-11-17 DIAGNOSIS — D508 Other iron deficiency anemias: Secondary | ICD-10-CM

## 2019-11-17 DIAGNOSIS — D696 Thrombocytopenia, unspecified: Secondary | ICD-10-CM | POA: Diagnosis not present

## 2019-11-17 DIAGNOSIS — D509 Iron deficiency anemia, unspecified: Secondary | ICD-10-CM | POA: Diagnosis not present

## 2019-11-17 DIAGNOSIS — D72819 Decreased white blood cell count, unspecified: Secondary | ICD-10-CM | POA: Diagnosis not present

## 2019-11-17 MED ORDER — LORATADINE 10 MG PO TABS
ORAL_TABLET | ORAL | Status: AC
  Filled 2019-11-17: qty 1

## 2019-11-17 MED ORDER — ACETAMINOPHEN 325 MG PO TABS
ORAL_TABLET | ORAL | Status: AC
  Filled 2019-11-17: qty 2

## 2019-11-17 MED ORDER — LORATADINE 10 MG PO TABS
10.0000 mg | ORAL_TABLET | Freq: Once | ORAL | Status: AC
  Administered 2019-11-17: 10 mg via ORAL

## 2019-11-17 MED ORDER — SODIUM CHLORIDE 0.9 % IV SOLN
750.0000 mg | Freq: Once | INTRAVENOUS | Status: AC
  Administered 2019-11-17: 750 mg via INTRAVENOUS
  Filled 2019-11-17: qty 15

## 2019-11-17 MED ORDER — SODIUM CHLORIDE 0.9 % IV SOLN
Freq: Once | INTRAVENOUS | Status: AC
  Filled 2019-11-17: qty 250

## 2019-11-17 MED ORDER — ACETAMINOPHEN 325 MG PO TABS
650.0000 mg | ORAL_TABLET | Freq: Once | ORAL | Status: AC
  Administered 2019-11-17: 650 mg via ORAL

## 2019-11-17 NOTE — Patient Instructions (Signed)

## 2019-11-20 ENCOUNTER — Encounter (HOSPITAL_COMMUNITY): Payer: PPO

## 2019-11-20 NOTE — Telephone Encounter (Signed)
Please let her know that her sleep study has been interpreted, is consistent with Obstructive sleep apnea.  It was recommended that she return to the sleep lab for CPAP titration study and initiation of CPAP if she is willing to do so.  If she would like to start CPAP please arrange a CPAP titration study for her

## 2019-11-20 NOTE — Telephone Encounter (Signed)
Spoke with patient and provided sleep study results per Dr. Lamonte Sakai.  Pt. Agreed to having the CPAP titration study, order placed.  Let patient know she would receive a call to schedule the CPAP titration study.  Nothing further needed.

## 2019-11-21 ENCOUNTER — Telehealth: Payer: Self-pay | Admitting: Hematology

## 2019-11-21 NOTE — Telephone Encounter (Signed)
R/s appt per 7/14 sch msg - pt aware of new appt date and time

## 2019-11-22 ENCOUNTER — Inpatient Hospital Stay: Payer: PPO

## 2019-11-22 ENCOUNTER — Ambulatory Visit (INDEPENDENT_AMBULATORY_CARE_PROVIDER_SITE_OTHER): Payer: PPO | Admitting: Emergency Medicine

## 2019-11-22 ENCOUNTER — Other Ambulatory Visit: Payer: Self-pay

## 2019-11-22 DIAGNOSIS — R0609 Other forms of dyspnea: Secondary | ICD-10-CM

## 2019-11-22 DIAGNOSIS — R0600 Dyspnea, unspecified: Secondary | ICD-10-CM | POA: Diagnosis not present

## 2019-11-22 LAB — PULMONARY FUNCTION TEST
DL/VA % pred: 64 %
DL/VA: 2.68 ml/min/mmHg/L
DLCO cor % pred: 63 %
DLCO cor: 10.83 ml/min/mmHg
DLCO unc % pred: 63 %
DLCO unc: 10.83 ml/min/mmHg
FEF 25-75 Post: 1.6 L/sec
FEF 25-75 Pre: 1.13 L/sec
FEF2575-%Change-Post: 41 %
FEF2575-%Pred-Post: 124 %
FEF2575-%Pred-Pre: 87 %
FEV1-%Change-Post: 8 %
FEV1-%Pred-Post: 95 %
FEV1-%Pred-Pre: 88 %
FEV1-Post: 1.63 L
FEV1-Pre: 1.5 L
FEV1FVC-%Change-Post: 5 %
FEV1FVC-%Pred-Pre: 102 %
FEV6-%Change-Post: 3 %
FEV6-%Pred-Post: 94 %
FEV6-%Pred-Pre: 90 %
FEV6-Post: 2.03 L
FEV6-Pre: 1.96 L
FEV6FVC-%Change-Post: 1 %
FEV6FVC-%Pred-Post: 106 %
FEV6FVC-%Pred-Pre: 105 %
FVC-%Change-Post: 2 %
FVC-%Pred-Post: 88 %
FVC-%Pred-Pre: 86 %
FVC-Post: 2.03 L
FVC-Pre: 1.98 L
Post FEV1/FVC ratio: 80 %
Post FEV6/FVC ratio: 100 %
Pre FEV1/FVC ratio: 76 %
Pre FEV6/FVC Ratio: 99 %
RV % pred: 101 %
RV: 2.25 L
TLC % pred: 100 %
TLC: 4.65 L

## 2019-11-22 NOTE — Progress Notes (Signed)
PFT done today. 

## 2019-11-23 ENCOUNTER — Other Ambulatory Visit: Payer: Self-pay | Admitting: Cardiovascular Disease

## 2019-11-26 ENCOUNTER — Inpatient Hospital Stay: Payer: PPO

## 2019-11-26 ENCOUNTER — Other Ambulatory Visit: Payer: Self-pay

## 2019-11-26 VITALS — BP 119/54 | HR 73 | Temp 98.7°F | Resp 18

## 2019-11-26 DIAGNOSIS — D508 Other iron deficiency anemias: Secondary | ICD-10-CM

## 2019-11-26 DIAGNOSIS — D509 Iron deficiency anemia, unspecified: Secondary | ICD-10-CM | POA: Diagnosis not present

## 2019-11-26 MED ORDER — LORATADINE 10 MG PO TABS
ORAL_TABLET | ORAL | Status: AC
  Filled 2019-11-26: qty 1

## 2019-11-26 MED ORDER — ACETAMINOPHEN 325 MG PO TABS
ORAL_TABLET | ORAL | Status: AC
  Filled 2019-11-26: qty 2

## 2019-11-26 MED ORDER — LORATADINE 10 MG PO TABS
10.0000 mg | ORAL_TABLET | Freq: Once | ORAL | Status: AC
  Administered 2019-11-26: 10 mg via ORAL

## 2019-11-26 MED ORDER — ACETAMINOPHEN 325 MG PO TABS
650.0000 mg | ORAL_TABLET | Freq: Once | ORAL | Status: AC
  Administered 2019-11-26: 650 mg via ORAL

## 2019-11-26 MED ORDER — SODIUM CHLORIDE 0.9 % IV SOLN
Freq: Once | INTRAVENOUS | Status: AC
  Filled 2019-11-26: qty 250

## 2019-11-26 MED ORDER — SODIUM CHLORIDE 0.9 % IV SOLN
750.0000 mg | Freq: Once | INTRAVENOUS | Status: AC
  Administered 2019-11-26: 750 mg via INTRAVENOUS
  Filled 2019-11-26: qty 15

## 2019-11-26 NOTE — Patient Instructions (Signed)

## 2019-11-27 NOTE — Patient Instructions (Signed)
DUE TO COVID-19 ONLY ONE VISITOR IS ALLOWED TO COME WITH YOU AND STAY IN THE WAITING ROOM ONLY DURING PRE OP AND PROCEDURE DAY OF SURGERY. TWo VISITOR MAY VISIT WITH YOU AFTER SURGERY IN YOUR PRIVATE ROOM DURING VISITING HOURS ONLY! 10am-8pm  YOU NEED TO HAVE A COVID 19 TEST ON__7-22-21_____ @_______ , THIS TEST MUST BE DONE BEFORE SURGERY, COME  [address], [place] , [address].  ([place]) ONCE YOUR COVID TEST IS COMPLETED, PLEASE BEGIN THE QUARANTINE INSTRUCTIONS AS OUTLINED IN YOUR HANDOUT.                Janet Choi  11/27/2019   Your procedure is scheduled on: 12-03-19   Report to [facility] Main  Entrance   Report to admitting at       0900 AM     Call this number if you have problems the morning of surgery [phone]    Remember: NO SOLID FOOD AFTER MIDNIGHT THE NIGHT PRIOR TO SURGERY. NOTHING BY MOUTH EXCEPT CLEAR LIQUIDS UNTIL  0800 am  . PLEASE FINISH G2  DRINK PER SURGEON ORDER  WHICH NEEDS TO BE COMPLETED AT    0800 am then NOTHING BY MOUTH .    CLEAR LIQUID DIET   Foods Allowed                                                                         Foods Excluded  Coffee and tea, regular and decaf  No creamer                          liquids that you cannot  Plain Jell-O any favor except red or purple                                           see through such as: Fruit ices (not with fruit pulp)                                                    milk, soups, orange juice  Iced Popsicles                                                          All solid food Carbonated beverages, regular and diet                                    Cranberry, grape and apple juices Sports drinks like Gatorade Lightly seasoned clear broth or consume(fat free) Sugar, honey syrup   _____________________________________________________________________    BRUSH YOUR TEETH MORNING OF SURGERY AND RINSE YOUR MOUTH OUT, NO CHEWING GUM CANDY OR  MINTS.  Take these medicines the morning of surgery with A SIP OF WATER: protonix, gabapentin, wellbutrin, lexapro, inhalers and bring them with you                                 You may not have any metal on your body including hair pins and              piercings  Do not wear jewelry, make-up, lotions, powders or perfumes, deodorant             Do not wear nail polish on your fingernails.  Do not shave  48 hours prior to surgery.                Do not bring valuables to the hospital. [place].  Contacts, dentures or bridgework may not be worn into surgery.      Patients discharged the day of surgery will not be allowed to drive home. IF YOU ARE HAVING SURGERY AND GOING HOME THE SAME DAY, YOU MUST HAVE AN ADULT TO DRIVE YOU HOME AND BE WITH YOU FOR 24 HOURS. YOU MAY GO HOME BY TAXI OR UBER OR ORTHERWISE, BUT AN ADULT MUST ACCOMPANY YOU HOME AND STAY WITH YOU FOR 24 HOURS.  Name and phone number of your driver:  Special Instructions: N/A              Please read over the following fact sheets you were given: _____________________________________________________________________             [facility] - Preparing for Surgery Before surgery, you can play an important role.  Because skin is not sterile, your skin needs to be as free of germs as possible.  You can reduce the number of germs on your skin by washing with CHG (chlorahexidine gluconate) soap before surgery.  CHG is an antiseptic cleaner which kills germs and bonds with the skin to continue killing germs even after washing. Please DO NOT use if you have an allergy to CHG or antibacterial soaps.  If your skin becomes reddened/irritated stop using the CHG and inform your nurse when you arrive at Short Stay. Do not shave (including legs and underarms) for at least 48 hours prior to the first CHG shower.  You may shave your face/neck. Please follow these instructions  carefully:  1.  Shower with CHG Soap the night before surgery and the  morning of Surgery.  2.  If you choose to wash your hair, wash your hair first as usual with your  normal  shampoo.  3.  After you shampoo, rinse your hair and body thoroughly to remove the  shampoo.                           4.  Use CHG as you would any other liquid soap.  You can apply chg directly  to the skin and wash                       Gently with a scrungie or clean washcloth.  5.  Apply the CHG Soap to your body ONLY FROM THE NECK DOWN.   Do not use on face/ open  Wound or open sores. Avoid contact with eyes, ears mouth and genitals (private parts).                       Wash face,  Genitals (private parts) with your normal soap.             6.  Wash thoroughly, paying special attention to the area where your surgery  will be performed.  7.  Thoroughly rinse your body with warm water from the neck down.  8.  DO NOT shower/wash with your normal soap after using and rinsing off  the CHG Soap.                9.  Pat yourself dry with a clean towel.            10.  Wear clean pajamas.            11.  Place clean sheets on your bed the night of your first shower and do not  sleep with pets. Day of Surgery : Do not apply any lotions/deodorants the morning of surgery.  Please wear clean clothes to the hospital/surgery center.  FAILURE TO FOLLOW THESE INSTRUCTIONS MAY RESULT IN THE CANCELLATION OF YOUR SURGERY PATIENT SIGNATURE_________________________________  NURSE SIGNATURE__________________________________  ________________________________________________________________________  How to Manage Your Diabetes Before and After Surgery  Why is it important to control my blood sugar before and after surgery? . Improving blood sugar levels before and after surgery helps healing and can limit problems. . A way of improving blood sugar control is eating a healthy diet by: o  Eating less sugar  and carbohydrates o  Increasing activity/exercise o  Talking with your doctor about reaching your blood sugar goals . High blood sugars (greater than 180 mg/dL) can raise your risk of infections and slow your recovery, so you will need to focus on controlling your diabetes during the weeks before surgery. . Make sure that the doctor who takes care of your diabetes knows about your planned surgery including the date and location.  How do I manage my blood sugar before surgery? . Check your blood sugar at least 4 times a day, starting 2 days before surgery, to make sure that the level is not too high or low. o Check your blood sugar the morning of your surgery when you wake up and every 2 hours until you get to the Short Stay unit. . If your blood sugar is less than 70 mg/dL, you will need to treat for low blood sugar: o Do not take insulin. o Treat a low blood sugar (less than 70 mg/dL) with  cup of clear juice (cranberry or apple), 4 glucose tablets, OR glucose gel. o Recheck blood sugar in 15 minutes after treatment (to make sure it is greater than 70 mg/dL). If your blood sugar is not greater than 70 mg/dL on recheck, call [phone] for further instructions. . Report your blood sugar to the short stay nurse when you get to Short Stay.  . If you are admitted to the hospital after surgery: o Your blood sugar will be checked by the staff and you will probably be given insulin after surgery (instead of oral diabetes medicines) to make sure you have good blood sugar levels. o The goal for blood sugar control after surgery is 80-180 mg/dL.   WHAT DO I DO ABOUT MY DIABETES MEDICATION?  Marland Kitchen Do not take oral diabetes medicines (pills) the morning of surgery.  Marland Kitchen  THE NIGHT BEFORE SURGERY, take  50% of your usual dose of Lantus 35 units      . THE MORNING OF SURGERY, o units of Lantus insulin  . The day of surgery, do not take other diabetes injectables, including Byetta (exenatide), Bydureon  (exenatide ER), Victoza (liraglutide), or Trulicity (dulaglutide).  . If your CBG is greater than 220 mg/dL, you may take  of your sliding scale  . (correction) dose of insulin.

## 2019-11-27 NOTE — Progress Notes (Addendum)
PCP - Darreld Mclean  MD Cardiologist - Tiffany [place] LOV with Coletta Memos , NP 09-03-19 Clearance in epic 11-13-19  Pulm- Dr. Lamonte Sakai PFT 11-22-19 epic  lov 10-15-19 epic  PPM/ICD -  Device Orders -  Rep Notified -   Chest x-ray -  EKG - 07-25-19 epic Stress Test - 07-25-19 epic ECHO - 2017 Cardiac Cath - 2017 [id number] epic  Sleep Study -  CPAP -   Fasting Blood Sugar -  Checks Blood Sugar _____ times a day  Blood Thinner Instructions: Aspirin Instructions:81mg   ERAS Protcol - PRE-SURGERY Ensure or G2-   COVID TEST- 11-29-19  ACTIVITY-Do light housework without SOB has to go slow Can not walk flight of stairs without SOB  Anesthesia review: DM,HTN,CAD/ Stent , COPD ,OSA no cpap at this time, wbc 2.1, HGB ,9.8  Patient denies shortness of breath, fever, cough and chest pain at PAT appointment  SOB with Activity   All instructions explained to the patient, with a verbal understanding of the material. Patient agrees to go over the instructions while at home for a better understanding. Patient also instructed to self quarantine after being tested for COVID-19. The opportunity to ask questions was provided.

## 2019-11-28 ENCOUNTER — Encounter (HOSPITAL_COMMUNITY): Payer: Self-pay | Admitting: Physician Assistant

## 2019-11-28 ENCOUNTER — Encounter (HOSPITAL_COMMUNITY): Payer: Self-pay | Admitting: Certified Registered"

## 2019-11-28 ENCOUNTER — Encounter (HOSPITAL_COMMUNITY): Payer: Self-pay

## 2019-11-28 ENCOUNTER — Encounter (HOSPITAL_COMMUNITY)
Admission: RE | Admit: 2019-11-28 | Discharge: 2019-11-28 | Disposition: A | Discharge status: Home / Self Care | Payer: PPO | Source: Ambulatory Visit | Attending: General Surgery | Admitting: General Surgery

## 2019-11-28 ENCOUNTER — Other Ambulatory Visit: Payer: Self-pay

## 2019-11-28 DIAGNOSIS — Z01812 Encounter for preprocedural laboratory examination: Secondary | ICD-10-CM | POA: Diagnosis not present

## 2019-11-28 HISTORY — DX: Chronic obstructive pulmonary disease, unspecified: J44.9

## 2019-11-28 HISTORY — DX: Pneumonia, unspecified organism: J18.9

## 2019-11-28 HISTORY — DX: Obstructive sleep apnea (adult) (pediatric): G47.33

## 2019-11-28 HISTORY — DX: Personal history of urinary calculi: Z87.442

## 2019-11-28 LAB — BASIC METABOLIC PANEL
Anion gap: 9 (ref 5–15)
BUN: 26 mg/dL — ABNORMAL HIGH (ref 8–23)
CO2: 27 mmol/L (ref 22–32)
Calcium: 9.1 mg/dL (ref 8.9–10.3)
Chloride: 105 mmol/L (ref 98–111)
Creatinine, Ser: 1.48 mg/dL — ABNORMAL HIGH (ref 0.44–1.00)
GFR calc Af Amer: 39 mL/min — ABNORMAL LOW (ref >60)
GFR calc non Af Amer: 33 mL/min — ABNORMAL LOW (ref >60)
Glucose, Bld: 116 mg/dL — ABNORMAL HIGH (ref 70–99)
Potassium: 4.3 mmol/L (ref 3.5–5.1)
Sodium: 141 mmol/L (ref 135–145)

## 2019-11-28 LAB — CBC
HCT: 31.3 % — ABNORMAL LOW (ref 36.0–46.0)
Hemoglobin: 9.8 g/dL — ABNORMAL LOW (ref 12.0–15.0)
MCH: 34.1 pg — ABNORMAL HIGH (ref 26.0–34.0)
MCHC: 31.3 g/dL (ref 30.0–36.0)
MCV: 109.1 fL — ABNORMAL HIGH (ref 80.0–100.0)
Platelets: 135 10*3/uL — ABNORMAL LOW (ref 150–400)
RBC: 2.87 MIL/uL — ABNORMAL LOW (ref 3.87–5.11)
RDW: 16.3 % — ABNORMAL HIGH (ref 11.5–15.5)
WBC: 2.1 10*3/uL — ABNORMAL LOW (ref 4.0–10.5)
nRBC: 0.9 % — ABNORMAL HIGH (ref 0.0–0.2)

## 2019-11-28 LAB — GLUCOSE, CAPILLARY: Glucose-Capillary: 118 mg/dL — ABNORMAL HIGH (ref 70–99)

## 2019-11-29 ENCOUNTER — Other Ambulatory Visit (HOSPITAL_COMMUNITY)
Admission: RE | Admit: 2019-11-29 | Discharge: 2019-11-29 | Disposition: A | Discharge status: Home / Self Care | Payer: PPO | Source: Ambulatory Visit | Attending: General Surgery | Admitting: General Surgery

## 2019-11-29 DIAGNOSIS — Z01812 Encounter for preprocedural laboratory examination: Secondary | ICD-10-CM | POA: Insufficient documentation

## 2019-11-29 DIAGNOSIS — Z20822 Contact with and (suspected) exposure to covid-19: Secondary | ICD-10-CM | POA: Insufficient documentation

## 2019-11-29 LAB — SARS CORONAVIRUS 2 (TAT 6-24 HRS): SARS Coronavirus 2: NEGATIVE

## 2019-11-29 NOTE — Progress Notes (Addendum)
Anesthesia Chart Review   Case: [id number] Date/Time: 12/03/19 1045   Procedure: LAPAROSCOPIC CHOLECYSTECTOMY WITH INTRAOPERATIVE CHOLANGIOGRAM (N/A )   Anesthesia type: General   Pre-op diagnosis: SYMPROMATIC CHOLELITHIASIS   Location: [place] [facility] / [facility]   Surgeons: Greer Pickerel, MD      DISCUSSION:79 y.o. former smoker (62.5 pack years, quit 11/11/83) with h/o HTN, HLD, DM II, GERD, CAD, CKD Stage III, COPD, OSA, symptomatic cholelithiasis scheduled for above procedure 12/03/2019 with Dr. Greer Pickerel.   Per cardiology risk assessment 11/14/2019, "Chart reviewed as part of pre-operative protocol coverage. Patient was contacted 11/14/2019 in reference to pre-operative risk assessment for pending surgery as outlined below.  HALYNN REITANO was last seen on 09/03/2019 by Coletta Memos NP.  Since that day, OBELIA BONELLO has done well without chest pain or worsening dyspnea. Recent myoview in March 2021 was reassuring.  Therefore, based on ACC/AHA guidelines, the patient would be at acceptable risk for the planned procedure without further cardiovascular testing."  Pt seen by pulmonology 10/15/2019 due to exertional dyspnea.  Per OV note, "Suspect multifactorial.  Obesity and restriction are almost certainly contributors, also with evidence for COPD although we have quantified.  Suspect that deconditioning, her anemia (which is under evaluation) are also contributors.  Need to walk next time to rule out occult desaturation.  She may benefit from supplemental oxygen.  Agree with her current bronchodilator regimen pending PFTs.  She may benefit going forward from cardiopulmonary rehab"  Pulmonology clearance requested.  VS: BP 129/65   Pulse 80   Temp 36.6 C (Oral)   Resp 20   Ht 5\' 1"  (1.549 m)   Wt 112.9 kg   SpO2 95%   BMI 47.05 kg/m   PROVIDERS: [name], MD is PCP   Skeet Latch, MD is Cardiologist   Baltazar Apo, MD is Pulmonologist  LABS: Labs reviewed: Acceptable for  surgery. (all labs ordered are listed, but only abnormal results are displayed)  Labs Reviewed  BASIC METABOLIC PANEL - Abnormal; Notable for the following components:      Result Value   Glucose, Bld 116 (*)    BUN 26 (*)    Creatinine, Ser 1.48 (*)    GFR calc non Af Amer 33 (*)    GFR calc Af Amer 39 (*)    All other components within normal limits  CBC - Abnormal; Notable for the following components:   WBC 2.1 (*)    RBC 2.87 (*)    Hemoglobin 9.8 (*)    HCT 31.3 (*)    MCV 109.1 (*)    MCH 34.1 (*)    RDW 16.3 (*)    Platelets 135 (*)    nRBC 0.9 (*)    All other components within normal limits  GLUCOSE, CAPILLARY - Abnormal; Notable for the following components:   Glucose-Capillary 118 (*)    All other components within normal limits  TYPE AND SCREEN     IMAGES:   EKG: 07/25/2019 Rate 87 bpm  NSR Left axis deviation  Low voltage QRS Possible anterolateral infarct, age undetermined  CV: Myocardial Perfusion 07/25/2019 IMPRESSION: 1. Small focus of mild ischemia in the anterior septal wall. No additional evidence of reversible ischemia. No evidence of infarction.  2. Normal left ventricular wall motion.  3. Left ventricular ejection fraction 56%  4. Non invasive risk stratification*: Low to intermediate  Cardiac Cath 04/15/2016  Non-ST elevation myocardial infarction with identification of 95-62% segmental mid LAD stenosis and focal  eccentric 95-99% mid circumflex stenosis.  The first diagonal is small and contains segmental 80% stenosis. The coronaries are otherwise widely patent.  Successful drug-eluting stent using Onyx Resolute to reduce a 95% mid LAD stenosis to 0% with TIMI grade 3 flow. Final stent diameter 3.0 mm.  Successful drug-eluting stent using Onyx Resolute to reduce a 95% mid circumflex stenosis to 0% with TIMI grade 3 flow. Post stent diameter 3.5 mm. Intervention on this vessel was, complicated by microembolization to the the most  distal obtuse marginal beyond a bifurcation.  Mild hypokinesis of the anterior wall. EF 40-50%.  RECOMMENDATIONS:   Brilinta and aspirin times at least 12 months.  We discussed high-intensity statin therapy. She is willing to restart therapy.  Aggrastat infusion for 18 hours due to microembolization.  IV nitroglycerin for 12 hours.  Potential discharge tomorrow assuming stable clinical condition without significant rebound and cardiac markers.  Echo 04/15/2016 Study Conclusions   - Left ventricle: The cavity size was normal. There was moderate  focal basal and mild concentric hypertrophy. Systolic function  was normal. The estimated ejection fraction was in the range of  50% to 55%. There is akinesis of the distal septal and apical  myocardium. There is akinesis of the apicalinferior myocardium.  There was an increased relative contribution of atrial  contraction to ventricular filling. Doppler parameters are  consistent with abnormal left ventricular relaxation (grade 1  diastolic dysfunction). Doppler parameters are consistent with  high ventricular filling pressure.  - Aortic valve: Mildly to moderately calcified annulus. Mildly  calcified leaflets.  - Mitral valve: Calcified annulus. Mildly calcified leaflets .  There was trivial regurgitation.  - Atrial septum: No defect or patent foramen ovale was identified. Past Medical History:  Diagnosis Date  . Anemia    hx (04/14/2016)  . Anxiety   . Arthritis    "severe in my back; hands; ankles" (04/14/2016)  . Atypical chest pain 01/24/2018   Midline pain absent supine "constant" daytime since 12/31/17 > resolved as of 02/20/2018 on gerd/ gas diet   . Basal cell carcinoma    "several burned off; one cut off"  . CAD in native artery    a. NSTEMI 04/2016 - s/p DES toLAD and LCx. PCI to LCx notable for microembolization during cath.  . Chest pain- reslved with stopping Brilinta now on Plaix 04/16/2016   . Chronic lower back pain   . CKD (chronic kidney disease), stage III    stage 3  . COPD (chronic obstructive pulmonary disease) ([place])   . Depression   . Diverticulosis   . DJD (degenerative joint disease)   . Family history of adverse reaction to anesthesia    "half-sister used to get real sick" (04/14/2016)  . Gallstones   . GERD (gastroesophageal reflux disease)   . History of gout   . History of kidney stones   . Hyperlipidemia   . Hypertension   . IBS (irritable bowel syndrome)   . Ischemic cardiomyopathy    a. 04/2016: EF 40-50% by cath, 50-55% +WMA by echo.  . 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 ([place]) ~ 2015  . Morbid obesity ([place])   . NSTEMI (non-ST elevated myocardial infarction) ([place]) 04/14/2016  . Obstructive sleep apnea of adult    appt for cpap 8-8 to adjust settings no cpap at this time  . Peripheral neuropathy    feet and toes  . Peripheral vascular disease ([place])   . RLS (restless legs syndrome)   . Spinal  stenosis   . Type II diabetes mellitus ([place])   . Vitamin D deficiency   . Walking pneumonia     Past Surgical History:  Procedure Laterality Date  . APPENDECTOMY    . BASAL CELL CARCINOMA EXCISION Left    leg  . CARDIAC CATHETERIZATION N/A 04/15/2016   Procedure: Left Heart Cath and Coronary Angiography;  Surgeon: Belva Crome, MD;  Location: [facility] [facility];  Service: Cardiovascular;  Laterality: N/A;  . CARDIAC CATHETERIZATION N/A 04/15/2016   Procedure: Coronary Stent Intervention;  Surgeon: Belva Crome, MD;  Location: [place] [facility];  Service: Cardiovascular;  Laterality: N/A;  Mid LAD Mid CFX  . CARPAL TUNNEL RELEASE Right    with trigger finger release  . CATARACT EXTRACTION, BILATERAL Bilateral   . DEBRIDEMENT TENNIS ELBOW Right   . LUMBAR DISC SURGERY  X 2  . MELANOMA EXCISION Left    "towards the back of my neck"  . SHOULDER ARTHROSCOPY W/ ROTATOR CUFF REPAIR Left   . SHOULDER OPEN ROTATOR CUFF REPAIR Right   . TOTAL KNEE  ARTHROPLASTY Bilateral     MEDICATIONS: . acetaminophen (TYLENOL) 325 MG tablet  . albuterol (VENTOLIN HFA) 108 (90 Base) MCG/ACT inhaler  . aspirin EC 81 MG EC tablet  . atorvastatin (LIPITOR) 80 MG tablet  . Budeson-Glycopyrrol-Formoterol (BREZTRI AEROSPHERE) 160-9-4.8 MCG/ACT AERO  . buPROPion (WELLBUTRIN XL) 300 MG 24 hr tablet  . Cholecalciferol ([employer] VIT D 5000 HIGH-POTENCY) 5000 units capsule  . Continuous Blood Gluc Receiver (FREESTYLE LIBRE 14 DAY READER) DEVI  . Continuous Blood Gluc Sensor (FREESTYLE LIBRE 14 DAY SENSOR) MISC  . diclofenac sodium (VOLTAREN) 1 % GEL  . escitalopram (LEXAPRO) 20 MG tablet  . ferrous sulfate 325 (65 FE) MG tablet  . furosemide (LASIX) 40 MG tablet  . gabapentin (NEURONTIN) 100 MG capsule  . Insulin Glargine (BASAGLAR KWIKPEN) 100 UNIT/ML SOPN  . Insulin Pen Needle (BD PEN NEEDLE NANO U/F) 32G X 4 MM MISC  . ketoconazole (NIZORAL) 2 % cream  . Lancets (ONETOUCH ULTRASOFT) lancets  . liraglutide (VICTOZA) 18 MG/3ML SOPN  . losartan (COZAAR) 50 MG tablet  . metoprolol succinate (TOPROL-XL) 25 MG 24 hr tablet  . nitroGLYCERIN (NITROSTAT) 0.4 MG SL tablet  . NOVOLOG FLEXPEN 100 UNIT/ML FlexPen  . ONETOUCH VERIO test strip  . pantoprazole (PROTONIX) 40 MG tablet  . Polyethylene Glycol 400 (BLINK TEARS OP)  . rOPINIRole (REQUIP) 3 MG tablet  . tiZANidine (ZANAFLEX) 4 MG tablet  . triamcinolone (NASACORT) 55 MCG/ACT AERO nasal inhaler  . vitamin B-12 (CYANOCOBALAMIN) 1000 MCG tablet   No current facility-administered medications for this encounter.     Konrad Felix, PA-C [facility] [phone]

## 2019-11-30 ENCOUNTER — Telehealth: Payer: Self-pay | Admitting: Emergency Medicine

## 2019-11-30 NOTE — Telephone Encounter (Signed)
I checked and no form was received  I called and asked that this be refaxed  Will await fax

## 2019-11-30 NOTE — Telephone Encounter (Signed)
Received fax and placed in Dr Agustina Caroli lookat  Surgery on 12/03/19  They are aware RB out of the office until then  I checked with TP and she states did not feel comfortable giving clearance for procedure

## 2019-12-03 ENCOUNTER — Encounter (HOSPITAL_COMMUNITY): Payer: Self-pay | Admitting: General Surgery

## 2019-12-03 ENCOUNTER — Ambulatory Visit: Payer: PPO | Admitting: Emergency Medicine

## 2019-12-03 ENCOUNTER — Ambulatory Visit (HOSPITAL_COMMUNITY)
Admission: RE | Admit: 2019-12-03 | Discharge: 2019-12-03 | Disposition: A | Discharge status: Home / Self Care | Payer: PPO | Attending: General Surgery | Admitting: General Surgery

## 2019-12-03 ENCOUNTER — Encounter (HOSPITAL_COMMUNITY)
Admission: RE | Disposition: A | Discharge status: Home / Self Care | Payer: Self-pay | Source: Home / Self Care | Attending: General Surgery

## 2019-12-03 LAB — TYPE AND SCREEN
ABO/RH(D): AB POS
Antibody Screen: NEGATIVE

## 2019-12-03 SURGERY — LAPAROSCOPIC CHOLECYSTECTOMY WITH INTRAOPERATIVE CHOLANGIOGRAM
Anesthesia: General

## 2019-12-03 MED ORDER — FENTANYL CITRATE (PF) 250 MCG/5ML IJ SOLN
INTRAMUSCULAR | Status: AC
  Filled 2019-12-03: qty 5

## 2019-12-03 MED ORDER — PROPOFOL 10 MG/ML IV BOLUS
INTRAVENOUS | Status: AC
  Filled 2019-12-03: qty 20

## 2019-12-03 NOTE — Anesthesia Preprocedure Evaluation (Deleted)
Anesthesia Evaluation    Airway        Dental   Pulmonary former smoker,           Cardiovascular hypertension,      Neuro/Psych    GI/Hepatic   Endo/Other  diabetes  Renal/GU      Musculoskeletal   Abdominal   Peds  Hematology   Anesthesia Other Findings   Reproductive/Obstetrics                             Anesthesia Physical Anesthesia Plan  ASA:   Anesthesia Plan:    Post-op Pain Management:    Induction:   PONV Risk Score and Plan:   Airway Management Planned:   Additional Equipment:   Intra-op Plan:   Post-operative Plan:   Informed Consent:   Plan Discussed with:   Anesthesia Plan Comments: (Unable to confirm pulmonary clearance prior to surgery. Discussed with surgeon and due to non-urgent nature of the procedure, we have elected to postpone surgery until patient completes all recommendations of pulmonary team. Pt is aware and agreeable. )        Anesthesia Quick Evaluation

## 2019-12-03 NOTE — Telephone Encounter (Signed)
Patient is to have surgery on 12/03/19 which is before her visit with BW and PFT on 12/06/19 . I will route this to Dr. Redmond Pulling with [facility] as fyi.'  Dr. Lamonte Sakai advise if any changes

## 2019-12-03 NOTE — Progress Notes (Signed)
Surgery cancelled as patient needs pulmonary clearance as per Dr Smith Robert

## 2019-12-03 NOTE — Telephone Encounter (Signed)
She see's Janet Choi on 6/29 with PFT. Should be able to risk stratify her at that visit, send letter to Dr Redmond Pulling w [facility].

## 2019-12-04 NOTE — Telephone Encounter (Signed)
That was yesterday, and it appears that the procedure was deferred while we wait for this eval. She needs to be seen 7/29 as planned. Thanks.

## 2019-12-05 ENCOUNTER — Other Ambulatory Visit: Payer: Self-pay

## 2019-12-05 ENCOUNTER — Encounter: Payer: Self-pay | Admitting: Internal Medicine

## 2019-12-05 ENCOUNTER — Ambulatory Visit (INDEPENDENT_AMBULATORY_CARE_PROVIDER_SITE_OTHER): Payer: PPO | Admitting: Internal Medicine

## 2019-12-05 ENCOUNTER — Emergency Department (HOSPITAL_COMMUNITY): Payer: PPO

## 2019-12-05 ENCOUNTER — Encounter (HOSPITAL_COMMUNITY): Payer: Self-pay

## 2019-12-05 ENCOUNTER — Emergency Department (HOSPITAL_COMMUNITY)
Admission: EM | Admit: 2019-12-05 | Discharge: 2019-12-05 | Disposition: A | Discharge status: Home / Self Care | Payer: PPO | Attending: Emergency Medicine | Admitting: Emergency Medicine

## 2019-12-05 ENCOUNTER — Telehealth: Payer: Self-pay

## 2019-12-05 VITALS — BP 94/60 | HR 88 | Temp 97.8°F | Resp 16 | Ht 61.0 in | Wt 245.6 lb

## 2019-12-05 DIAGNOSIS — R1011 Right upper quadrant pain: Secondary | ICD-10-CM | POA: Diagnosis not present

## 2019-12-05 DIAGNOSIS — Z96653 Presence of artificial knee joint, bilateral: Secondary | ICD-10-CM | POA: Diagnosis not present

## 2019-12-05 DIAGNOSIS — I5032 Chronic diastolic (congestive) heart failure: Secondary | ICD-10-CM | POA: Insufficient documentation

## 2019-12-05 DIAGNOSIS — Z794 Long term (current) use of insulin: Secondary | ICD-10-CM | POA: Diagnosis not present

## 2019-12-05 DIAGNOSIS — I2510 Atherosclerotic heart disease of native coronary artery without angina pectoris: Secondary | ICD-10-CM | POA: Insufficient documentation

## 2019-12-05 DIAGNOSIS — Z79899 Other long term (current) drug therapy: Secondary | ICD-10-CM | POA: Diagnosis not present

## 2019-12-05 DIAGNOSIS — E1140 Type 2 diabetes mellitus with diabetic neuropathy, unspecified: Secondary | ICD-10-CM | POA: Insufficient documentation

## 2019-12-05 DIAGNOSIS — E1122 Type 2 diabetes mellitus with diabetic chronic kidney disease: Secondary | ICD-10-CM | POA: Insufficient documentation

## 2019-12-05 DIAGNOSIS — N200 Calculus of kidney: Secondary | ICD-10-CM | POA: Diagnosis not present

## 2019-12-05 DIAGNOSIS — I130 Hypertensive heart and chronic kidney disease with heart failure and stage 1 through stage 4 chronic kidney disease, or unspecified chronic kidney disease: Secondary | ICD-10-CM | POA: Diagnosis not present

## 2019-12-05 DIAGNOSIS — N132 Hydronephrosis with renal and ureteral calculous obstruction: Secondary | ICD-10-CM | POA: Diagnosis not present

## 2019-12-05 DIAGNOSIS — Z87891 Personal history of nicotine dependence: Secondary | ICD-10-CM | POA: Diagnosis not present

## 2019-12-05 DIAGNOSIS — J449 Chronic obstructive pulmonary disease, unspecified: Secondary | ICD-10-CM | POA: Insufficient documentation

## 2019-12-05 DIAGNOSIS — R1032 Left lower quadrant pain: Secondary | ICD-10-CM | POA: Diagnosis not present

## 2019-12-05 DIAGNOSIS — N183 Chronic kidney disease, stage 3 unspecified: Secondary | ICD-10-CM | POA: Diagnosis not present

## 2019-12-05 DIAGNOSIS — Z7951 Long term (current) use of inhaled steroids: Secondary | ICD-10-CM | POA: Insufficient documentation

## 2019-12-05 DIAGNOSIS — K449 Diaphragmatic hernia without obstruction or gangrene: Secondary | ICD-10-CM | POA: Diagnosis not present

## 2019-12-05 DIAGNOSIS — I700 Atherosclerosis of aorta: Secondary | ICD-10-CM | POA: Diagnosis not present

## 2019-12-05 DIAGNOSIS — Z7982 Long term (current) use of aspirin: Secondary | ICD-10-CM | POA: Insufficient documentation

## 2019-12-05 DIAGNOSIS — K5730 Diverticulosis of large intestine without perforation or abscess without bleeding: Secondary | ICD-10-CM | POA: Diagnosis not present

## 2019-12-05 DIAGNOSIS — I10 Essential (primary) hypertension: Secondary | ICD-10-CM | POA: Diagnosis not present

## 2019-12-05 LAB — CBC
HCT: 35.2 % — ABNORMAL LOW (ref 36.0–46.0)
Hemoglobin: 10.9 g/dL — ABNORMAL LOW (ref 12.0–15.0)
MCH: 34.4 pg — ABNORMAL HIGH (ref 26.0–34.0)
MCHC: 31 g/dL (ref 30.0–36.0)
MCV: 111 fL — ABNORMAL HIGH (ref 80.0–100.0)
Platelets: 122 10*3/uL — ABNORMAL LOW (ref 150–400)
RBC: 3.17 MIL/uL — ABNORMAL LOW (ref 3.87–5.11)
RDW: 16.8 % — ABNORMAL HIGH (ref 11.5–15.5)
WBC: 2.5 10*3/uL — ABNORMAL LOW (ref 4.0–10.5)
nRBC: 0 % (ref 0.0–0.2)

## 2019-12-05 LAB — URINALYSIS, ROUTINE W REFLEX MICROSCOPIC
Bacteria, UA: NONE SEEN
Bilirubin Urine: NEGATIVE
Glucose, UA: 50 mg/dL — AB
Ketones, ur: NEGATIVE mg/dL
Leukocytes,Ua: NEGATIVE
Nitrite: NEGATIVE
Protein, ur: 30 mg/dL — AB
Specific Gravity, Urine: 1.019 (ref 1.005–1.030)
pH: 5 (ref 5.0–8.0)

## 2019-12-05 LAB — COMPREHENSIVE METABOLIC PANEL
ALT: 13 U/L (ref 0–44)
AST: 16 U/L (ref 15–41)
Albumin: 3.8 g/dL (ref 3.5–5.0)
Alkaline Phosphatase: 82 U/L (ref 38–126)
Anion gap: 11 (ref 5–15)
BUN: 19 mg/dL (ref 8–23)
CO2: 24 mmol/L (ref 22–32)
Calcium: 8.8 mg/dL — ABNORMAL LOW (ref 8.9–10.3)
Chloride: 105 mmol/L (ref 98–111)
Creatinine, Ser: 1.67 mg/dL — ABNORMAL HIGH (ref 0.44–1.00)
GFR calc Af Amer: 33 mL/min — ABNORMAL LOW (ref >60)
GFR calc non Af Amer: 29 mL/min — ABNORMAL LOW (ref >60)
Glucose, Bld: 205 mg/dL — ABNORMAL HIGH (ref 70–99)
Potassium: 4.2 mmol/L (ref 3.5–5.1)
Sodium: 140 mmol/L (ref 135–145)
Total Bilirubin: 0.7 mg/dL (ref 0.3–1.2)
Total Protein: 6.6 g/dL (ref 6.5–8.1)

## 2019-12-05 LAB — LIPASE, BLOOD: Lipase: 28 U/L (ref 11–51)

## 2019-12-05 MED ORDER — SODIUM CHLORIDE 0.9 % IV BOLUS
500.0000 mL | Freq: Once | INTRAVENOUS | Status: AC
  Administered 2019-12-05: 500 mL via INTRAVENOUS

## 2019-12-05 MED ORDER — ONDANSETRON HCL 4 MG/2ML IJ SOLN
4.0000 mg | Freq: Once | INTRAMUSCULAR | Status: AC
  Administered 2019-12-05: 4 mg via INTRAVENOUS
  Filled 2019-12-05: qty 2

## 2019-12-05 MED ORDER — TAMSULOSIN HCL 0.4 MG PO CAPS: 0.4000 mg | ORAL_CAPSULE | Freq: Every day | ORAL | 0 refills | Status: DC

## 2019-12-05 MED ORDER — SODIUM CHLORIDE 0.9% FLUSH: 3.0000 mL | Freq: Once | INTRAVENOUS | Status: DC

## 2019-12-05 MED ORDER — HYDROCODONE-ACETAMINOPHEN 5-325 MG PO TABS: 2.0000 | ORAL_TABLET | ORAL | 0 refills | Status: DC | PRN

## 2019-12-05 MED ORDER — FENTANYL CITRATE (PF) 100 MCG/2ML IJ SOLN
25.0000 ug | Freq: Once | INTRAMUSCULAR | Status: AC
  Administered 2019-12-05: 25 ug via INTRAVENOUS
  Filled 2019-12-05: qty 2

## 2019-12-05 NOTE — ED Notes (Signed)
Pt provided w/labeled specimen cup for urine collection. ENMiles 

## 2019-12-05 NOTE — ED Provider Notes (Signed)
[facility] DEPT Provider Note   CSN: [id number] Arrival date & time: 12/05/19  1512     History Chief Complaint  Patient presents with   Abdominal Pain    Janet Choi is a 79 y.o. female.  HPI 79 year old female with a history of CKD stage III, CAD, basal cell carcinoma, hypertension, hyperlipidemia, diverticulitis, kidney stones, type II DM presents to the ER from her PCPs office after fairly sudden onset of left lower quadrant abdominal pain which started last night.  Patient states that around 6 PM yesterday, she noticed that she started to have 10 out of 10 pain to her left lower quadrant.  She had some episodes of nausea and dry heaving but no vomiting.  She states she has a history of diverticulitis and kidney stones.  She states that this pain does not feel like either.  Denies any hematuria.  No fevers or chills.  No chest pain or shortness of breath.  No headaches or weakness.  She reports a poor appetite secondary to her pain.  She went to see her PCP and he sent her here for a CT scan.  She has not taken anything for symptoms.  She does not follow with a urologist.  Last bowel movement was yesterday and normal.    Past Medical History:  Diagnosis Date   Anemia    hx (04/14/2016)   Anxiety    Arthritis    "severe in my back; hands; ankles" (04/14/2016)   Atypical chest pain 01/24/2018   Midline pain absent supine "constant" daytime since 12/31/17 > resolved as of 02/20/2018 on gerd/ gas diet    Basal cell carcinoma    "several burned off; one cut off"   CAD in native artery    a. NSTEMI 04/2016 - s/p DES toLAD and LCx. PCI to LCx notable for microembolization during cath.   Chest pain- reslved with stopping Brilinta now on Plaix 04/16/2016   Chronic lower back pain    CKD (chronic kidney disease), stage III    stage 3   COPD (chronic obstructive pulmonary disease) (HCC)    Depression    Diverticulosis    DJD (degenerative  joint disease)    Family history of adverse reaction to anesthesia    "half-sister used to get real sick" (04/14/2016)   Gallstones    GERD (gastroesophageal reflux disease)    History of gout    History of kidney stones    Hyperlipidemia    Hypertension    IBS (irritable bowel syndrome)    Ischemic cardiomyopathy    a. 04/2016: EF 40-50% by cath, 50-55% +WMA by echo.   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 ([place]) ~ 2015   Morbid obesity ([place])    NSTEMI (non-ST elevated myocardial infarction) ([place]) 04/14/2016   Obstructive sleep apnea of adult    appt for cpap 8-8 to adjust settings no cpap at this time   Peripheral neuropathy    feet and toes   Peripheral vascular disease (HCC)    RLS (restless legs syndrome)    Spinal stenosis    Type II diabetes mellitus ([place])    Vitamin D deficiency    Walking pneumonia     Patient Active Problem List   Diagnosis Date Noted   Anemia 11/08/2019   Thrombocytopenia ([place]) 11/08/2019   Neutropenia ([place]) 11/08/2019   Chronic diarrhea 10/25/2019   Pancreatic insufficiency 10/15/2019   Pancytopenia ([place]) 10/15/2019   Long term (current) use of insulin (  [place]) 09/07/2019   Solitary pulmonary nodule 08/29/2019   Chronic bronchitis ([place]) 08/29/2019   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 ([place]) 08/07/2019   Insulin dependent type 2 diabetes mellitus ([place]) 08/07/2019   Chronic diastolic heart failure ([place]) 07/30/2019   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   DJD (degenerative joint disease) of knee 09/11/2018   Diabetic polyneuropathy associated with type 2 diabetes mellitus ([place]) 06/27/2018   Upper airway cough syndrome 01/24/2018   DJD (degenerative joint disease), lumbosacral 06/02/2017   CAD S/P percutaneous coronary angioplasty    History of non-ST elevation myocardial infarction (NSTEMI) 04/14/2016   CKD stage 3 due to type 2 diabetes mellitus ([place]) 04/14/2016   Iron  deficiency anemia 08/15/2015   OSA and COPD overlap syndrome ([place]) 04/16/2015   RLS (restless legs syndrome) 03/20/2014   Type 2 diabetes mellitus with hyperlipidemia ([place]) 03/20/2014   Medication management 09/12/2013   Morbid obesity due to excess calories ([place]) 05/14/2013   Vitamin D deficiency 05/14/2013   Hyperlipidemia associated with type 2 diabetes mellitus ([place]) 02/24/2009   Essential hypertension 02/24/2009   Diverticulosis of large intestine 02/24/2009   History of colonic polyps 02/24/2009    Past Surgical History:  Procedure Laterality Date   APPENDECTOMY     BASAL CELL CARCINOMA EXCISION Left    leg   CARDIAC CATHETERIZATION N/A 04/15/2016   Procedure: Left Heart Cath and Coronary Angiography;  Surgeon: Belva Crome, MD;  Location: [facility];  Service: Cardiovascular;  Laterality: N/A;   CARDIAC CATHETERIZATION N/A 04/15/2016   Procedure: Coronary Stent Intervention;  Surgeon: Belva Crome, MD;  Location: [facility];  Service: Cardiovascular;  Laterality: N/A;  Mid LAD Mid CFX   CARPAL TUNNEL RELEASE Right    with trigger finger release   CATARACT EXTRACTION, BILATERAL Bilateral    DEBRIDEMENT TENNIS ELBOW Right    LUMBAR DISC SURGERY  X 2   MELANOMA EXCISION Left    "towards the back of my neck"   SHOULDER ARTHROSCOPY W/ ROTATOR CUFF REPAIR Left    SHOULDER OPEN ROTATOR CUFF REPAIR Right    TOTAL KNEE ARTHROPLASTY Bilateral      OB History   No obstetric history on file.     Family History  Problem Relation Age of Onset   Heart disease Mother    Heart attack Mother    Kidney disease Father    AAA (abdominal aortic aneurysm) Father    Parkinson's disease Father    Cancer Brother        type unknown   Heart disease Son    Cancer Sister        female    Alzheimer's disease Sister    Colon cancer Neg Hx    Colon polyps Neg Hx    Esophageal cancer Neg Hx    Pancreatic cancer Neg Hx    Stomach cancer  Neg Hx    Liver disease Neg Hx    Diabetes Neg Hx     Social History   Tobacco Use   Smoking status: Former Smoker    Packs/day: 2.50    Years: 25.00    Pack years: 62.50    Types: Cigarettes    Quit date: 11/11/1983    Years since quitting: 36.0   Smokeless tobacco: Never Used  Vaping Use   Vaping Use: Never used  Substance Use Topics   Alcohol use: No    Alcohol/week: 0.0 standard drinks   Drug  use: No    Home Medications Prior to Admission medications   Medication Sig Start Date End Date Taking? Authorizing Provider  acetaminophen (TYLENOL) 325 MG tablet Take 2 tablets (650 mg total) by mouth every 4 (four) hours as needed for headache or mild pain. 04/20/16  Yes Isaiah Serge, NP  albuterol (VENTOLIN HFA) 108 (90 Base) MCG/ACT inhaler Inhale 2 puffs into the lungs every 4 (four) hours as needed for wheezing or shortness of breath. 09/07/19  Yes Vicie Mutters, PA-C  aspirin EC 81 MG EC tablet Take 1 tablet (81 mg total) by mouth daily. 04/21/16  Yes Isaiah Serge, NP  atorvastatin (LIPITOR) 80 MG tablet TAKE 1 TABLET (80 MG TOTAL) BY MOUTH DAILY AT 6 PM. 11/23/19  Yes Skeet Latch, MD  Budeson-Glycopyrrol-Formoterol (BREZTRI AEROSPHERE) 160-9-4.8 MCG/ACT AERO Inhale 2 puffs into the lungs in the morning and at bedtime. 10/15/19  Yes Collene Gobble, MD  buPROPion (WELLBUTRIN XL) 300 MG 24 hr tablet Take 1 tablet (300 mg total) by mouth daily. Take 1 tablet Daily for Mood Patient taking differently: Take 300 mg by mouth daily.  10/02/19  Yes Vicie Mutters, PA-C  Cholecalciferol ([employer] VIT D 5000 HIGH-POTENCY) 5000 units capsule Take 5,000 Units by mouth 2 (two) times daily.    Yes [provider]  diclofenac sodium (VOLTAREN) 1 % GEL Apply 4 g topically 4 (four) times daily. Patient taking differently: Apply 4 g topically 4 (four) times daily as needed (pain).  01/18/18  Yes Vicie Mutters, PA-C  escitalopram (LEXAPRO) 20 MG tablet Take 1 tablet (20 mg  total) by mouth daily. Patient taking differently: Take 40 mg by mouth daily.  07/02/19 07/01/20 Yes Vicie Mutters, PA-C  ferrous sulfate 325 (65 FE) MG tablet Take 325 mg by mouth 2 (two) times daily with a meal.    Yes [provider]  furosemide (LASIX) 40 MG tablet Take 40 mg by mouth daily as needed for fluid.    Yes [provider]  gabapentin (NEURONTIN) 100 MG capsule Take 1 to 2 capsules 3 to 4 x /day as needed for Painful Diabetic Neuropathy Patient taking differently: Take 200 mg by mouth 2 (two) times daily.  01/01/19  Yes [name], MD  Insulin Glargine (BASAGLAR KWIKPEN) 100 UNIT/ML SOPN Inject 70 units Daily for Diabetes Patient taking differently: Inject 70 Units into the skin at bedtime. Inject 70 units Daily for Diabetes 04/30/19  Yes [name], MD  ketoconazole (NIZORAL) 2 % cream APPLY TO AFFECTED AREA TWICE A DAY Patient taking differently: Apply 1 application topically daily as needed (itching).  11/16/19  Yes [name], MD  liraglutide (VICTOZA) 18 MG/3ML SOPN Inject 0.2 mLs (1.2 mg total) into the skin every morning. 07/30/19  Yes Vicie Mutters, PA-C  losartan (COZAAR) 50 MG tablet Take 1 tablet Daily for BP & Diabetic Kidney Protection Patient taking differently: Take 50 mg by mouth daily.  03/10/19  Yes [name], MD  metoprolol succinate (TOPROL-XL) 25 MG 24 hr tablet Take 1 tablet (25 mg total) by mouth at bedtime. 07/25/19  Yes Duke, Tami Lin, PA  nitroGLYCERIN (NITROSTAT) 0.4 MG SL tablet Place 1 tablet (0.4 mg total) under the tongue every 5 (five) minutes x 3 doses as needed for chest pain. 10/04/17  Yes [name], MD  NOVOLOG FLEXPEN 100 UNIT/ML FlexPen INJECT 50 TO 75 UNITS 3 X /DAY BEFORE MEALS AS DIRECTED FOR DIABETES Patient taking differently: Inject 20-25 Units into the skin 3 (three) times  daily before meals. BS<150 Take 20 units BS>150 Take 25 units 11/16/19  Yes [name], MD  pantoprazole  (PROTONIX) 40 MG tablet Take 1 tablet (40 mg total) by mouth 2 (two) times daily before a meal. 10/15/19 10/14/20 Yes Vicie Mutters, PA-C  Polyethylene Glycol 400 (BLINK TEARS OP) Place 1 drop into both eyes daily as needed (dry eyes).    Yes [provider]  rOPINIRole (REQUIP) 3 MG tablet Take 1 tablet 4 x /day for Restless Legs Patient taking differently: Take 6 mg by mouth in the morning and at bedtime.  03/25/19  Yes [name], MD  tiZANidine (ZANAFLEX) 4 MG tablet Take 1 tablet at Bedtime as needed for Muscle Spasms Patient taking differently: Take 4 mg by mouth at bedtime as needed for muscle spasms.  08/21/19  Yes [name], MD  triamcinolone (NASACORT) 55 MCG/ACT AERO nasal inhaler PLACE 2 SPRAYS INTO THE NOSE AT BEDTIME. Patient taking differently: Place 2 sprays into the nose at bedtime as needed (allergies).  11/16/19 11/15/20 Yes [name], MD  vitamin B-12 (CYANOCOBALAMIN) 1000 MCG tablet Take 1,000 mcg by mouth daily.   Yes [provider]  Continuous Blood Gluc Receiver (FREESTYLE LIBRE 14 DAY READER) DEVI 1 Device by Does not apply route every 14 (fourteen) days. 09/07/19   Vicie Mutters, PA-C  Continuous Blood Gluc Sensor (FREESTYLE LIBRE 14 DAY SENSOR) MISC Apply 1 application topically every 14 (fourteen) days. 09/07/19   Vicie Mutters, PA-C  HYDROcodone-acetaminophen (NORCO/VICODIN) 5-325 MG tablet Take 2 tablets by mouth every 4 (four) hours as needed. 12/05/19   Garald Balding, PA-C  Insulin Pen Needle (BD PEN NEEDLE NANO U/F) 32G X 4 MM MISC USE AS DIRECTED 4 TIMES A DAY 02/22/19   [name], MD  Lancets [facility] ULTRASOFT) lancets Use as instructed 04/27/16   Philemon Kingdom, MD  [facility] VERIO test strip USE AS INSTRUCTED TO CHECK SUGAR 2 TIMES DAILY. 09/21/17   Philemon Kingdom, MD  tamsulosin (FLOMAX) 0.4 MG CAPS capsule Take 1 capsule (0.4 mg total) by mouth daily. 12/05/19   Garald Balding, PA-C    Allergies    Brilinta  [ticagrelor], Aspirin, Nsaids, Minocycline hcl, Oruvail [ketoprofen], Tricor [fenofibrate], Vasotec [enalaprilat], and Zinc  Review of Systems   Review of Systems  Constitutional: Negative for chills and fever.  HENT: Negative for ear pain and sore throat.   Eyes: Negative for pain and visual disturbance.  Respiratory: Negative for cough and shortness of breath.   Cardiovascular: Negative for chest pain and palpitations.  Gastrointestinal: Positive for abdominal pain and nausea. Negative for diarrhea and vomiting.  Genitourinary: Negative for difficulty urinating, dysuria, hematuria, pelvic pain and vaginal bleeding.  Musculoskeletal: Negative for arthralgias and back pain.  Skin: Negative for color change and rash.  Neurological: Negative for seizures and syncope.  Psychiatric/Behavioral: Negative for confusion.  All other systems reviewed and are negative.   Physical Exam Updated Vital Signs BP (!) 142/59    Pulse 74    Temp 98.9 F (37.2 C) (Oral)    Resp 22    Ht 5\' 1"  (1.549 m)    Wt (!) 110.2 kg    SpO2 90%    BMI 45.91 kg/m   Physical Exam Vitals and nursing note reviewed.  Constitutional:      General: She is not in acute distress.    Appearance: She is well-developed. She is not ill-appearing or diaphoretic.  HENT:     Head: Normocephalic and atraumatic.  Eyes:  Conjunctiva/sclera: Conjunctivae normal.  Cardiovascular:     Rate and Rhythm: Normal rate and regular rhythm.     Heart sounds: Normal heart sounds. No murmur heard.   Pulmonary:     Effort: Pulmonary effort is normal. No respiratory distress.     Breath sounds: Normal breath sounds.  Abdominal:     General: Abdomen is flat. Bowel sounds are normal.     Palpations: Abdomen is soft.     Tenderness: There is abdominal tenderness in the left lower quadrant. There is guarding. There is no right CVA tenderness or left CVA tenderness.     Hernia: A hernia is present.  Musculoskeletal:     Cervical back:  Neck supple.  Skin:    General: Skin is warm and dry.  Neurological:     General: No focal deficit present.     Mental Status: She is alert.  Psychiatric:        Mood and Affect: Mood normal.        Behavior: Behavior normal.     ED Results / Procedures / Treatments   Labs (all labs ordered are listed, but only abnormal results are displayed) Labs Reviewed  COMPREHENSIVE METABOLIC PANEL - Abnormal; Notable for the following components:      Result Value   Glucose, Bld 205 (*)    Creatinine, Ser 1.67 (*)    Calcium 8.8 (*)    GFR calc non Af Amer 29 (*)    GFR calc Af Amer 33 (*)    All other components within normal limits  CBC - Abnormal; Notable for the following components:   WBC 2.5 (*)    RBC 3.17 (*)    Hemoglobin 10.9 (*)    HCT 35.2 (*)    MCV 111.0 (*)    MCH 34.4 (*)    RDW 16.8 (*)    Platelets 122 (*)    All other components within normal limits  URINALYSIS, ROUTINE W REFLEX MICROSCOPIC - Abnormal; Notable for the following components:   Glucose, UA 50 (*)    Hgb urine dipstick MODERATE (*)    Protein, ur 30 (*)    All other components within normal limits  LIPASE, BLOOD    EKG EKG Interpretation  Date/Time:  Wednesday December 05 2019 18:29:11 EDT Ventricular Rate:  73 PR Interval:    QRS Duration: 93 QT Interval:  438 QTC Calculation: 483 R Axis:   -45 Text Interpretation: Sinus rhythm Atrial premature complexes Inferior infarct, old Abnormal lateral Q waves Anterior infarct, old No significant change since last tracing Confirmed by Isla Pence [phone]) on 12/05/2019 6:58:18 PM   Radiology CT ABDOMEN PELVIS WO CONTRAST  Result Date: 12/05/2019 CLINICAL DATA:  [age]-year-old female with left lower quadrant abdominal pain. EXAM: CT ABDOMEN AND PELVIS WITHOUT CONTRAST TECHNIQUE: Multidetector CT imaging of the abdomen and pelvis was performed following the standard protocol without IV contrast. COMPARISON:  CT abdomen pelvis dated 04/22/2016.  FINDINGS: Evaluation of this exam is limited in the absence of intravenous contrast. Lower chest: Bibasilar linear atelectasis/scarring. There is coronary vascular calcification. There is hypoattenuation of the cardiac blood pool suggestive of anemia. Clinical correlation is recommended. No intra-abdominal free air or free fluid. Hepatobiliary: No focal liver abnormality is seen. No gallstones, gallbladder wall thickening, or biliary dilatation. Pancreas: Unremarkable. No pancreatic ductal dilatation or surrounding inflammatory changes. Spleen: Normal in size without focal abnormality. Adrenals/Urinary Tract: The adrenal glands unremarkable. There is a punctate nonobstructing left renal upper pole calculus.  There are several adjacent stones noted in the distal left ureter with the largest stone measuring approximately 7 mm and overall combined length of approximately 15 mm (coronal 90/3). There is mild left hydronephrosis. A 15 mm hypodense lesion in the inferior pole of the left kidney is not characterized but may represent a cyst. Ultrasound may provide better evaluation. There is left perinephric stranding which may be reactive. Correlation with urinalysis recommended to exclude superimposed UTI. A punctate nonobstructing right renal inferior pole calculus may be present. There is no hydronephrosis or obstructing stone on the right. The right ureter is unremarkable. The urinary bladder is unremarkable as well. Stomach/Bowel: There is extensive sigmoid diverticulosis without active inflammatory changes. Stranding and fluid adjacent to the sigmoid colon represent inferior extension of left perinephric stranding along the paracolic gutter. There is a small hiatal hernia. There is no bowel obstruction. Appendectomy. Vascular/Lymphatic: Mild aortoiliac atherosclerotic disease. The IVC is unremarkable. No portal venous gas. There is no adenopathy. Reproductive: The uterus is grossly unremarkable. Other: None  Musculoskeletal: Osteopenia with extensive degenerative changes of the spine. No acute osseous pathology. IMPRESSION: 1. Several adjacent stones in the distal left ureter with mild left hydronephrosis. Correlation with urinalysis recommended to exclude superimposed UTI. 2. Extensive sigmoid diverticulosis. No bowel obstruction. 3. Aortic Atherosclerosis (ICD10-I70.0). Electronically Signed   By: Anner Crete M.D.   On: 12/05/2019 20:02    Procedures Procedures (including critical care time)  Medications Ordered in ED Medications  sodium chloride flush (NS) 0.9 % injection 3 mL (has no administration in time range)  fentaNYL (SUBLIMAZE) injection 25 mcg (25 mcg Intravenous Given 12/05/19 1932)  sodium chloride 0.9 % bolus 500 mL (500 mLs Intravenous New Bag/Given 12/05/19 1930)  ondansetron (ZOFRAN) injection 4 mg (4 mg Intravenous Given 12/05/19 1932)    ED Course  I have reviewed the triage vital signs and the nursing notes.  Pertinent labs & imaging results that were available during my care of the patient were reviewed by me and considered in my medical decision making (see chart for details).    MDM Rules/Calculators/A&P                          79 year old with fairly sudden onset of left lower quadrant pain which started last night.  On presentation, the patient is alert, oriented, nontoxic-appearing, no acute distress.  Vital signs are overall reassuring, she is afebrile.  Physical exam with tenderness to the left lower quadrant.  No CVA tenderness.  CBC with leukopenia which appears to be consistent.  Hemoglobin of 10.9 which actually appears to be improved from previous.  CMP without any significant lecture normalities, glucose of 205.  Creatinine of 1.67, slightly worse from previous at 1.67.  Lipase normal.  EKG normal sinus rhythm, no significant changes from previous.  CT of the abdomen with several adjacent stones in the distal left ureter with mild hydronephrosis.  No evidence  of diverticulitis.  Patient received 500 cc of fluid, fentanyl.  States her pain is down to a 3/10.  UA without clear evidence of UTI, patient denies any urinary symptoms at this time.  Overall, work-up reassuring.  Patient will be sent home with pain medication and Flomax with urology follow-up.  The patient's questions have been answered to her satisfaction, she voices understanding and is agreeable to the plan. Final Clinical Impression(s) / ED Diagnoses Final diagnoses:  Kidney stone    Rx / DC Orders ED Discharge Orders  Ordered    tamsulosin (FLOMAX) 0.4 MG CAPS capsule  Daily     Discontinue  Reprint     12/05/19 2133    HYDROcodone-acetaminophen (NORCO/VICODIN) 5-325 MG tablet  Every 4 hours PRN     Discontinue  Reprint     12/05/19 2133           Garald Balding, PA-C 12/05/19 2211    Isla Pence, MD 12/06/19 [phone]

## 2019-12-05 NOTE — Progress Notes (Signed)
History of Present Illness:    This very nice 79 y.o. DWF with HTN, HLD, poorly controlled Insulin Dependent T2_DM / ZOX0R presenting with 16+ hour of  LLQ pain. Currently planned elective Cholecystectomy deferred pending pulmonary evaluation. Denies N/V or diarrhea. c/o chills - No fever. No dyspnea.     Medications  .  BASAGLAR KWIKPEN 100 UNIT/ML SOPN,Inject 70 Units into the skin at bedtime.  Marland Kitchen  VICTOZA 18 MG/3ML , Inject 0.2 mLs (1.2 mg total) into the skin every morning. Marland Kitchen  NOVOLOG  100 UNIT/ML FlexPen, Inject 20-25 Units  3  times daily before meals. Marland Kitchen  atorvastatin  80 MG tablet, TAKE 1 TABLET DAILY  .  furosemide  40 MG tablet, Take  as needed for fluid. Marland Kitchen  losartan  50 MG tablet, Take 1 tablet Daily .  metoprolol succ-XL) 25 MG , Take 1 tablet  at bedtime. Marland Kitchen  NITROSTAT 0.4 MG SL  as needed for chest pain. Marland Kitchen  albuterol HFA  inhaler,  2 puffs into the lungs every 4  hours as needed  .  BREZTRI   AEROSPHERE, 2 puffs into the lungs in the morning and at bedtime. Marland Kitchen  NASACORT  nasal inhaler, 2 SPRAYS INTO THE NOSE AT BEDTIME .  acetaminophen 325 MG , Take 2 tablets  every 4  hours as needed  .  aspirin EC 81 MG EC tablet, Take 1 tablet  daily. Current Outpatient Medications (Hematological):  .  ferrous sulfate 325  MG tablet, Take  2  times daily with a meal.  .  vitamin B-12 1000 MCG tablet, Take 1,000 mcgdaily. Marland Kitchen  buPROPion-XL 300 MG, Take 1 tablet daily.  Marland Kitchen VIT D 5000 units caps, Take 5,000 Units  2  times daily.  .  diclofenac  1 % GEL, Apply 4 g topically 4  times daily. Marland Kitchen  escitalopram  20 MG tab, Take 1.5  tablet  daily.  .  Gabapentin 100 MG capsule, Take 200 mg by mouth 2 (two) times daily. ) .  ketoconazole  2 % cream, APPLY TO AFFECTED AREA TWICE A DAY .  pantoprazole40 MG tablet, Take 1 tablet (40 mg total) by mouth 2 (two) times daily before a meal. .  Polyethylene Glycol 400, Place 1 drop into both eyes daily as needed (dry eyes).  .  REQUIP 3 MG tab,Take 6 mg by  mouth in the morning and at bedtime. ) .  tiZANidine 4 MG tablet, Take 1 tablet at Bedtime as needed for Muscle Spasms  Problem list She has Hyperlipidemia associated with type 2 diabetes mellitus ([place]); Essential hypertension; Diverticulosis of large intestine; History of colonic polyps; Morbid obesity due to excess calories ([place]); Vitamin D deficiency; Medication management; RLS (restless legs syndrome); Type 2 diabetes mellitus with hyperlipidemia (HCC); OSA and COPD overlap syndrome ([place]); Iron deficiency anemia; History of non-ST elevation myocardial infarction (NSTEMI); CKD stage 3 due to type 2 diabetes mellitus ([place]); CAD S/P percutaneous coronary angioplasty; DOE (dyspnea on exertion); Anxiety; DJD (degenerative joint disease), lumbosacral; Upper airway cough syndrome; Diabetic polyneuropathy associated with type 2 diabetes mellitus ([place]); DJD (degenerative joint disease) of knee; Chest pain with moderate risk for cardiac etiology; Chronic diastolic heart failure ([place]); Superior mesenteric artery stenosis ([place]); 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 ([place]); Insulin dependent type 2 diabetes mellitus ([place]); Solitary pulmonary nodule; Chronic bronchitis ([place]); Long term (current) use of insulin ([place]); Pancreatic insufficiency; Pancytopenia ([place]); Chronic diarrhea; Anemia; Thrombocytopenia ([place]); and Neutropenia ([place])  on their problem list.   Observations/Objective:   BP  94/60   P 88   T 97.8 F    R 16   Ht 5\' 1"     Wt  245 lb SpO2 92%   BMI 46.41  HEENT - WNL. Neck - supple.  Chest - Clear equal BS. Cor - Nl HS. RRR w/o sig m Abd - Massive abd panniculus. Soft with LLQ >RUQ tenderness w/guarding. MS- FROM w/o deformities.  Gait Nl. Neuro -  Nl w/o focal abnormalities.  Assessment and Plan:  1. Left lower quadrant abdominal pain  2. Right upper quadrant abdominal pain  - Patient advised ER eval indicated to r/o perforated Diverticular abscess vs less likely Lt  renal stone.   Follow Up Instructions:      I discussed the assessment and treatment plan with the patient and her sister. The patient was provided an opportunity to ask questions and all were answered. They agreed with the plan and demonstrated an understanding of the instructions. Between 15-20 minutes of exam, counseling, chart review and critical decision making was performed  Kirtland Bouchard, MD

## 2019-12-05 NOTE — Discharge Instructions (Addendum)
Your work-up showed kidney stones in your right kidney.  Please take  the prescribed medication as directed.  Please make sure to schedule an appointment with urology.  Return to the ER if your symptoms worsen.

## 2019-12-05 NOTE — ED Triage Notes (Signed)
Pt c/o LLQ abd pain since last night, seen by PCP today, told to go to ED for CT. Denies any urinary sx. Endorses nausea since last night, denies any vomiting.   Hx of diverticulitis and kidney stones/

## 2019-12-05 NOTE — Telephone Encounter (Signed)
Called pt to see if she could come in at 9:30am to see RB vs 2pm with BW tomorrow. Left vm for pt to call office and let us know. 9:30am appointment is held for [place], if pt confirms please schedule in this slot.

## 2019-12-06 ENCOUNTER — Ambulatory Visit: Payer: PPO | Admitting: Primary Care

## 2019-12-06 ENCOUNTER — Encounter: Payer: Self-pay | Admitting: Emergency Medicine

## 2019-12-06 ENCOUNTER — Ambulatory Visit (INDEPENDENT_AMBULATORY_CARE_PROVIDER_SITE_OTHER): Payer: PPO | Admitting: Emergency Medicine

## 2019-12-06 VITALS — BP 120/70 | HR 92 | Temp 100.3°F | Ht 61.0 in | Wt 247.0 lb

## 2019-12-06 DIAGNOSIS — J449 Chronic obstructive pulmonary disease, unspecified: Secondary | ICD-10-CM

## 2019-12-06 DIAGNOSIS — I272 Pulmonary hypertension, unspecified: Secondary | ICD-10-CM | POA: Diagnosis not present

## 2019-12-06 DIAGNOSIS — G4733 Obstructive sleep apnea (adult) (pediatric): Secondary | ICD-10-CM | POA: Diagnosis not present

## 2019-12-06 NOTE — Assessment & Plan Note (Signed)
Pulmonary function testing actually fairly reassuring with no clear obstruction except for some curve of her flow volume loop.  That being said her diffusion capacity is decreased and she is hypoxic on ambulation in the office.  I am starting her on 3 L/min with exertion.  Question whether she has a more emphysematous phenotype with associated hypoxia.  She is on Breztri, good bronchodilator therapy.  Her OSA is untreated, question whether she has evolving secondary pulmonary hypertension.  Her last echocardiogram was in 2017 and I will repeat this.  If she does have [place] then there may be other work-up to complete including repeat evaluation for thromboembolic disease, etc.  Finally, her anemia may be a contributor to her dyspnea and hypoxemia.  Work-up for this is underway, planning for EGD, CSY and I support getting this done.   Based on the above she has a moderate increased surgical risk. I would defer any elective surgery until after we establish her on exertional O2 and on CPAP

## 2019-12-06 NOTE — Progress Notes (Signed)
Subjective:    Patient ID: Janet Choi, female    DOB: 02-04-1941, 79 y.o.   MRN: [id number]  HPI 79 year old woman, former smoker (74 pack years) with a history of CAD and systolic CHF, arthritis, chronic kidney disease, hypertension, diabetes mellitus, OSA (not on CPAP), restless leg syndrome.  Also under evaluation for anemia, pancytopenia, about to see hematology.  She is referred for dyspnea.   She reports exertional dyspnea, some ups and downs. Difficulty walking through her house. She describes a tightness in her chest that can be present at rest or when active. The tightness is associated with dyspnea. Does minimal housework, difficulty to go shopping. She was having cough in May, was treated with pred taper and azithro >> flares about 2x a year. She has albuterol, and was started on breztri in May 2021. She isn't sure whether they are helping her much. Denies any CP.   She is about to have a repeat PSG, is willing to retry CPAP.   Spirometry 01/23/2018 reviewed by me, shows grossly normal airflows.  She underwent a CT chest 08/28/19 that I have reviewed, shows a 13mm LLL nodule not seen on scan in 2017.   ROV 12/06/19 --Janet Choi returns today for further evaluation of her tobacco use, obstructive lung disease.  She is 57, over 60-pack-year history of tobacco, with coronary disease and systolic CHF, CKD, severe OSA not on CPAP. Also with microcytic anemia / pancytopenia, receiving Fe infusions.  She is set up for CSY and EGD.  She was started on [place] after an apparent COPD exacerbation in May, appears to be benefiting some.  She describes "ups and downs". She is having difficult time walking any distance. Occasional cough, no sputum. Using albuterol about 2x a day - helps her. She was in the ED 7/28 with a renal stone. She is set for a CPAP titration study in early August. Last TTE was in 2017.   She has a pulmonary nodule with planned follow up CT  in October 2021, no ILD on that CT.    Pulmonary function testing done on 11/22/2019 reviewed by me, showed grossly normal airflows without a bronchodilator response, some curve to her flow volume loop is suggestive of mild obstruction.  Her lung volumes are normal.  Diffusion capacity is decreased and does not correct for alveolar volume.     Review of Systems As per HPI  Past Medical History:  Diagnosis Date   Anemia    hx (04/14/2016)   Anxiety    Arthritis    "severe in my back; hands; ankles" (04/14/2016)   Atypical chest pain 01/24/2018   Midline pain absent supine "constant" daytime since 12/31/17 > resolved as of 02/20/2018 on gerd/ gas diet    Basal cell carcinoma    "several burned off; one cut off"   CAD in native artery    a. NSTEMI 04/2016 - s/p DES toLAD and LCx. PCI to LCx notable for microembolization during cath.   Chest pain- reslved with stopping Brilinta now on Plaix 04/16/2016   Chronic lower back pain    CKD (chronic kidney disease), stage III    stage 3   COPD (chronic obstructive pulmonary disease) (HCC)    Depression    Diverticulosis    DJD (degenerative joint disease)    Family history of adverse reaction to anesthesia    "half-sister used to get real sick" (04/14/2016)   Gallstones    GERD (gastroesophageal reflux disease)    History  of gout    History of kidney stones    Hyperlipidemia    Hypertension    IBS (irritable bowel syndrome)    Ischemic cardiomyopathy    a. 04/2016: EF 40-50% by cath, 50-55% +WMA by echo.   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 ([place]) ~ 2015   Morbid obesity (HCC)    NSTEMI (non-ST elevated myocardial infarction) ([place]) 04/14/2016   Obstructive sleep apnea of adult    appt for cpap 8-8 to adjust settings no cpap at this time   Peripheral neuropathy    feet and toes   Peripheral vascular disease (HCC)    RLS (restless legs syndrome)    Spinal stenosis    Type II diabetes mellitus ([place])    Vitamin D deficiency     Walking pneumonia      Family History  Problem Relation Age of Onset   Heart disease Mother    Heart attack Mother    Kidney disease Father    AAA (abdominal aortic aneurysm) Father    Parkinson's disease Father    Cancer Brother        type unknown   Heart disease Son    Cancer Sister        female    Alzheimer's disease Sister    Colon cancer Neg Hx    Colon polyps Neg Hx    Esophageal cancer Neg Hx    Pancreatic cancer Neg Hx    Stomach cancer Neg Hx    Liver disease Neg Hx    Diabetes Neg Hx      Social History   Socioeconomic History   Marital status: Widowed    Spouse name: Not on file   Number of children: 1   Years of education: Not on file   Highest education level: Not on file  Occupational History   Occupation: retired  Tobacco Use   Smoking status: Former Smoker    Packs/day: 2.50    Years: 25.00    Pack years: 62.50    Types: Cigarettes    Quit date: 11/11/1983    Years since quitting: 36.0   Smokeless tobacco: Never Used  Vaping Use   Vaping Use: Never used  Substance and Sexual Activity   Alcohol use: No    Alcohol/week: 0.0 standard drinks   Drug use: No   Sexual activity: Not Currently  Other Topics Concern   Not on file  Social History Narrative   Not on file   Social Determinants of Health   Financial Resource Strain:    Difficulty of Paying Living Expenses:   Food Insecurity:    Worried About Charity fundraiser in the Last Year:    Arboriculturist in the Last Year:   Transportation Needs:    Film/video editor (Medical):    Lack of Transportation (Non-Medical):   Physical Activity:    Days of Exercise per Week:    Minutes of Exercise per Session:   Stress:    Feeling of Stress :   Social Connections:    Frequency of Communication with Friends and Family:    Frequency of Social Gatherings with Friends and Family:    Attends Religious Services:    Active Member of Clubs or  Organizations:    Attends Archivist Meetings:    Marital Status:   Intimate Partner Violence:    Fear of Current or Ex-Partner:    Emotionally Abused:    Physically Abused:  Sexually Abused:      Allergies  Allergen Reactions   Brilinta [Ticagrelor] Shortness Of Breath and Other (See Comments)    Chest pain (also)   Aspirin Other (See Comments)    Can tolerate in small doses (is already on a blood thinner AND has kidney disease)   Nsaids Other (See Comments)    Patient is taking a blood thinner and has kidney disease   Minocycline Hcl Other (See Comments)    Welts   Oruvail [Ketoprofen] Other (See Comments)    Has kidney disease and is taking a blood thinner   Tricor [Fenofibrate]     Reaction unknown   Vasotec [Enalaprilat]     Reaction unknown   Zinc     Reaction unknown     Outpatient Medications Prior to Visit  Medication Sig Dispense Refill   acetaminophen (TYLENOL) 325 MG tablet Take 2 tablets (650 mg total) by mouth every 4 (four) hours as needed for headache or mild pain.     albuterol (VENTOLIN HFA) 108 (90 Base) MCG/ACT inhaler Inhale 2 puffs into the lungs every 4 (four) hours as needed for wheezing or shortness of breath. 18 g 3   aspirin EC 81 MG EC tablet Take 1 tablet (81 mg total) by mouth daily.     atorvastatin (LIPITOR) 80 MG tablet TAKE 1 TABLET (80 MG TOTAL) BY MOUTH DAILY AT 6 PM. 90 tablet 2   Budeson-Glycopyrrol-Formoterol (BREZTRI AEROSPHERE) 160-9-4.8 MCG/ACT AERO Inhale 2 puffs into the lungs in the morning and at bedtime. 5.9 g 2   buPROPion (WELLBUTRIN XL) 300 MG 24 hr tablet Take 1 tablet (300 mg total) by mouth daily. Take 1 tablet Daily for Mood (Patient taking differently: Take 300 mg by mouth daily. ) 90 tablet 1   Cholecalciferol ([employer] VIT D 5000 HIGH-POTENCY) 5000 units capsule Take 5,000 Units by mouth 2 (two) times daily.      Continuous Blood Gluc Receiver (FREESTYLE LIBRE 14 DAY READER) DEVI 1 Device by  Does not apply route every 14 (fourteen) days. 2 each 4   Continuous Blood Gluc Sensor (FREESTYLE LIBRE 14 DAY SENSOR) MISC Apply 1 application topically every 14 (fourteen) days. 2 each 4   diclofenac sodium (VOLTAREN) 1 % GEL Apply 4 g topically 4 (four) times daily. (Patient taking differently: Apply 4 g topically 4 (four) times daily as needed (pain). ) 100 g 3   escitalopram (LEXAPRO) 20 MG tablet Take 1 tablet (20 mg total) by mouth daily. (Patient taking differently: Take 40 mg by mouth daily. ) 90 tablet 3   ferrous sulfate 325 (65 FE) MG tablet Take 325 mg by mouth 2 (two) times daily with a meal.      furosemide (LASIX) 40 MG tablet Take 40 mg by mouth daily as needed for fluid.      gabapentin (NEURONTIN) 100 MG capsule Take 1 to 2 capsules 3 to 4 x /day as needed for Painful Diabetic Neuropathy (Patient taking differently: Take 200 mg by mouth 2 (two) times daily. ) 360 capsule 3   HYDROcodone-acetaminophen (NORCO/VICODIN) 5-325 MG tablet Take 2 tablets by mouth every 4 (four) hours as needed. 10 tablet 0   Insulin Glargine (BASAGLAR KWIKPEN) 100 UNIT/ML SOPN Inject 70 units Daily for Diabetes (Patient taking differently: Inject 70 Units into the skin at bedtime. Inject 70 units Daily for Diabetes) 20 pen 3   Insulin Pen Needle (BD PEN NEEDLE NANO U/F) 32G X 4 MM MISC USE AS DIRECTED 4 TIMES  A DAY 400 each 3   ketoconazole (NIZORAL) 2 % cream APPLY TO AFFECTED AREA TWICE A DAY (Patient taking differently: Apply 1 application topically daily as needed (itching). ) 60 g 3   Lancets (ONETOUCH ULTRASOFT) lancets Use as instructed 100 each 12   liraglutide (VICTOZA) 18 MG/3ML SOPN Inject 0.2 mLs (1.2 mg total) into the skin every morning. 6 mL 3   losartan (COZAAR) 50 MG tablet Take 1 tablet Daily for BP & Diabetic Kidney Protection (Patient taking differently: Take 50 mg by mouth daily. ) 90 tablet 3   metoprolol succinate (TOPROL-XL) 25 MG 24 hr tablet Take 1 tablet (25 mg total)  by mouth at bedtime. 90 tablet 3   nitroGLYCERIN (NITROSTAT) 0.4 MG SL tablet Place 1 tablet (0.4 mg total) under the tongue every 5 (five) minutes x 3 doses as needed for chest pain. 25 tablet 4   NOVOLOG FLEXPEN 100 UNIT/ML FlexPen INJECT 50 TO 75 UNITS 3 X /DAY BEFORE MEALS AS DIRECTED FOR DIABETES (Patient taking differently: Inject 20-25 Units into the skin 3 (three) times daily before meals. BS<150 Take 20 units BS>150 Take 25 units) 15 mL 4   ONETOUCH VERIO test strip USE AS INSTRUCTED TO CHECK SUGAR 2 TIMES DAILY. 200 each 4   pantoprazole (PROTONIX) 40 MG tablet Take 1 tablet (40 mg total) by mouth 2 (two) times daily before a meal. 180 tablet 1   Polyethylene Glycol 400 (BLINK TEARS OP) Place 1 drop into both eyes daily as needed (dry eyes).      rOPINIRole (REQUIP) 3 MG tablet Take 1 tablet 4 x /day for Restless Legs (Patient taking differently: Take 6 mg by mouth in the morning and at bedtime. ) 360 tablet 3   tamsulosin (FLOMAX) 0.4 MG CAPS capsule Take 1 capsule (0.4 mg total) by mouth daily. 30 capsule 0   tiZANidine (ZANAFLEX) 4 MG tablet Take 1 tablet at Bedtime as needed for Muscle Spasms (Patient taking differently: Take 4 mg by mouth at bedtime as needed for muscle spasms. ) 90 tablet 1   triamcinolone (NASACORT) 55 MCG/ACT AERO nasal inhaler PLACE 2 SPRAYS INTO THE NOSE AT BEDTIME. (Patient taking differently: Place 2 sprays into the nose at bedtime as needed (allergies). ) 48 g 3   vitamin B-12 (CYANOCOBALAMIN) 1000 MCG tablet Take 1,000 mcg by mouth daily.     No facility-administered medications prior to visit.         Objective:   Physical Exam Vitals:   12/06/19 0940  BP: 120/70  Pulse: 92  Temp: 100.3 F (37.9 C)  TempSrc: Oral  SpO2: 91%  Weight: (!) 247 lb (112 kg)  Height: 5\' 1"  (1.549 m)   Gen: Pleasant, obese, in no distress,  normal affect. Comfortable at rest  ENT: No lesions,  mouth clear,  oropharynx clear but crowded, M4 airway, no  postnasal drip  Neck: No JVD, no stridor  Lungs: No use of accessory muscles, small breaths, no crackles or wheezing on normal respiration, no wheeze on forced expiration  Cardiovascular: RRR, heart sounds normal, soft systolic murmur, trace bilateral pretibial peripheral edema  Musculoskeletal: No deformities, no cyanosis or clubbing  Neuro: alert, awake, non focal  Skin: Warm, no lesions or rash     Assessment & Plan:  OSA and COPD overlap syndrome (HCC) Pulmonary function testing actually fairly reassuring with no clear obstruction except for some curve of her flow volume loop.  That being said her diffusion capacity is decreased and  she is hypoxic on ambulation in the office.  I am starting her on 3 L/min with exertion.  Question whether she has a more emphysematous phenotype with associated hypoxia.  She is on Breztri, good bronchodilator therapy.  Her OSA is untreated, question whether she has evolving secondary pulmonary hypertension.  Her last echocardiogram was in 2017 and I will repeat this.  If she does have [place] then there may be other work-up to complete including repeat evaluation for thromboembolic disease, etc.  Finally, her anemia may be a contributor to her dyspnea and hypoxemia.  Work-up for this is underway, planning for EGD, CSY and I support getting this done.   Based on the above she has a moderate increased surgical risk. I would defer any elective surgery until after we establish her on exertional O2 and on CPAP  Baltazar Apo, MD, PhD 12/06/2019, 10:33 AM [facility] [phone] or if no answer [phone]

## 2019-12-06 NOTE — Patient Instructions (Signed)
Walking oximetry today shows that your oxygen level drops when you exert yourself.  We will start you on 3 L/min to be used whenever you are walking or exerting.  You do not need to wear it when you are sitting still Get your CPAP titration study as planned.  We will use this to arrange for CPAP therapy while sleeping. Continue Breztri 2 puffs twice a day.  Rinse and gargle after using. Keep albuterol available to use 2 puffs up to every 4 hours if needed for shortness of breath, chest tightness, wheezing.  We will repeat your echocardiogram to compare with priors. I believe we should defer your gallbladder surgery until after we have you well-established on CPAP, well-established on your supplemental oxygen.  Because of your low oxygen levels you are at a moderately increased risk for general anesthesia or any surgery.  This will need to be considered prior to planning your surgery to ensure that the benefits of an operation outweigh any risks. Follow with Dr Lamonte Sakai in 6 weeks or next available to review your status on CPAP, oxygen.

## 2019-12-10 ENCOUNTER — Telehealth: Payer: Self-pay | Admitting: Internal Medicine

## 2019-12-10 DIAGNOSIS — N201 Calculus of ureter: Secondary | ICD-10-CM | POA: Diagnosis not present

## 2019-12-10 DIAGNOSIS — N183 Chronic kidney disease, stage 3 unspecified: Secondary | ICD-10-CM | POA: Diagnosis not present

## 2019-12-10 DIAGNOSIS — N132 Hydronephrosis with renal and ureteral calculous obstruction: Secondary | ICD-10-CM | POA: Diagnosis not present

## 2019-12-10 NOTE — Telephone Encounter (Signed)
patient called to report scheduled to see [facility], Dr Jeffie Pollock, today as work in-

## 2019-12-12 DIAGNOSIS — I2510 Atherosclerotic heart disease of native coronary artery without angina pectoris: Secondary | ICD-10-CM | POA: Diagnosis not present

## 2019-12-12 DIAGNOSIS — I5032 Chronic diastolic (congestive) heart failure: Secondary | ICD-10-CM | POA: Diagnosis not present

## 2019-12-14 NOTE — Telephone Encounter (Signed)
Dr. Lamonte Sakai, Please see comment regarding Echocardiogram and whether it needs to be done prior to her kidney surgery.  Please advise.  Thank you.

## 2019-12-14 NOTE — Telephone Encounter (Signed)
No, it's Ok to proceed with the surgery and then get the echocardiogram after.

## 2019-12-16 ENCOUNTER — Other Ambulatory Visit: Payer: Self-pay

## 2019-12-16 ENCOUNTER — Ambulatory Visit (HOSPITAL_BASED_OUTPATIENT_CLINIC_OR_DEPARTMENT_OTHER): Payer: PPO | Attending: Emergency Medicine | Admitting: Pulmonary Disease

## 2019-12-16 DIAGNOSIS — J449 Chronic obstructive pulmonary disease, unspecified: Secondary | ICD-10-CM | POA: Diagnosis not present

## 2019-12-16 DIAGNOSIS — G4733 Obstructive sleep apnea (adult) (pediatric): Secondary | ICD-10-CM | POA: Diagnosis not present

## 2019-12-16 DIAGNOSIS — Z79899 Other long term (current) drug therapy: Secondary | ICD-10-CM | POA: Diagnosis not present

## 2019-12-20 ENCOUNTER — Other Ambulatory Visit: Payer: Self-pay

## 2019-12-20 ENCOUNTER — Encounter (HOSPITAL_COMMUNITY): Payer: Self-pay | Admitting: Gastroenterology

## 2019-12-21 ENCOUNTER — Other Ambulatory Visit (HOSPITAL_COMMUNITY)
Admission: RE | Admit: 2019-12-21 | Discharge: 2019-12-21 | Disposition: A | Discharge status: Home / Self Care | Payer: PPO | Source: Ambulatory Visit | Attending: Gastroenterology | Admitting: Gastroenterology

## 2019-12-21 DIAGNOSIS — N201 Calculus of ureter: Secondary | ICD-10-CM | POA: Diagnosis not present

## 2019-12-21 DIAGNOSIS — K5730 Diverticulosis of large intestine without perforation or abscess without bleeding: Secondary | ICD-10-CM | POA: Diagnosis not present

## 2019-12-21 DIAGNOSIS — N132 Hydronephrosis with renal and ureteral calculous obstruction: Secondary | ICD-10-CM | POA: Diagnosis not present

## 2019-12-21 DIAGNOSIS — Z01812 Encounter for preprocedural laboratory examination: Secondary | ICD-10-CM | POA: Diagnosis not present

## 2019-12-21 DIAGNOSIS — I700 Atherosclerosis of aorta: Secondary | ICD-10-CM | POA: Diagnosis not present

## 2019-12-21 DIAGNOSIS — Z20822 Contact with and (suspected) exposure to covid-19: Secondary | ICD-10-CM | POA: Diagnosis not present

## 2019-12-21 DIAGNOSIS — N281 Cyst of kidney, acquired: Secondary | ICD-10-CM | POA: Diagnosis not present

## 2019-12-21 LAB — SARS CORONAVIRUS 2 (TAT 6-24 HRS): SARS Coronavirus 2: NEGATIVE

## 2019-12-24 ENCOUNTER — Telehealth: Payer: Self-pay | Admitting: Emergency Medicine

## 2019-12-25 ENCOUNTER — Encounter (HOSPITAL_COMMUNITY): Payer: Self-pay

## 2019-12-25 ENCOUNTER — Encounter (HOSPITAL_COMMUNITY)
Admission: RE | Disposition: A | Discharge status: Other Acute Inpatient Hospital | Payer: Self-pay | Source: Home / Self Care | Attending: Gastroenterology

## 2019-12-25 ENCOUNTER — Ambulatory Visit (HOSPITAL_COMMUNITY): Payer: PPO | Admitting: Anesthesiology

## 2019-12-25 ENCOUNTER — Other Ambulatory Visit: Payer: Self-pay

## 2019-12-25 ENCOUNTER — Ambulatory Visit (HOSPITAL_COMMUNITY): Payer: PPO

## 2019-12-25 ENCOUNTER — Encounter (HOSPITAL_COMMUNITY): Payer: Self-pay | Admitting: Gastroenterology

## 2019-12-25 ENCOUNTER — Inpatient Hospital Stay (HOSPITAL_COMMUNITY)
Admission: EM | Admit: 2019-12-25 | Discharge: 2019-12-25 | DRG: 291 | Disposition: A | Discharge status: Home / Self Care | Payer: PPO | Source: Ambulatory Visit | Attending: Internal Medicine | Admitting: Internal Medicine

## 2019-12-25 ENCOUNTER — Inpatient Hospital Stay (HOSPITAL_COMMUNITY)
Admission: EM | Admit: 2019-12-25 | Discharge: 2019-12-29 | Disposition: A | Discharge status: Home / Self Care | Payer: PPO | Source: Home / Self Care | Attending: Internal Medicine | Admitting: Internal Medicine

## 2019-12-25 DIAGNOSIS — Z886 Allergy status to analgesic agent status: Secondary | ICD-10-CM

## 2019-12-25 DIAGNOSIS — K5730 Diverticulosis of large intestine without perforation or abscess without bleeding: Secondary | ICD-10-CM

## 2019-12-25 DIAGNOSIS — N183 Chronic kidney disease, stage 3 unspecified: Secondary | ICD-10-CM | POA: Diagnosis not present

## 2019-12-25 DIAGNOSIS — G2581 Restless legs syndrome: Secondary | ICD-10-CM | POA: Diagnosis not present

## 2019-12-25 DIAGNOSIS — R300 Dysuria: Secondary | ICD-10-CM | POA: Diagnosis not present

## 2019-12-25 DIAGNOSIS — K2970 Gastritis, unspecified, without bleeding: Secondary | ICD-10-CM | POA: Diagnosis present

## 2019-12-25 DIAGNOSIS — E785 Hyperlipidemia, unspecified: Secondary | ICD-10-CM | POA: Diagnosis present

## 2019-12-25 DIAGNOSIS — Q438 Other specified congenital malformations of intestine: Secondary | ICD-10-CM | POA: Diagnosis not present

## 2019-12-25 DIAGNOSIS — N179 Acute kidney failure, unspecified: Secondary | ICD-10-CM | POA: Diagnosis not present

## 2019-12-25 DIAGNOSIS — D61818 Other pancytopenia: Secondary | ICD-10-CM | POA: Diagnosis present

## 2019-12-25 DIAGNOSIS — I130 Hypertensive heart and chronic kidney disease with heart failure and stage 1 through stage 4 chronic kidney disease, or unspecified chronic kidney disease: Secondary | ICD-10-CM | POA: Diagnosis not present

## 2019-12-25 DIAGNOSIS — K3189 Other diseases of stomach and duodenum: Secondary | ICD-10-CM | POA: Diagnosis not present

## 2019-12-25 DIAGNOSIS — I517 Cardiomegaly: Secondary | ICD-10-CM | POA: Diagnosis not present

## 2019-12-25 DIAGNOSIS — Z7951 Long term (current) use of inhaled steroids: Secondary | ICD-10-CM

## 2019-12-25 DIAGNOSIS — J810 Acute pulmonary edema: Secondary | ICD-10-CM | POA: Diagnosis present

## 2019-12-25 DIAGNOSIS — F329 Major depressive disorder, single episode, unspecified: Secondary | ICD-10-CM | POA: Diagnosis present

## 2019-12-25 DIAGNOSIS — K2990 Gastroduodenitis, unspecified, without bleeding: Secondary | ICD-10-CM | POA: Diagnosis present

## 2019-12-25 DIAGNOSIS — I9589 Other hypotension: Secondary | ICD-10-CM | POA: Diagnosis not present

## 2019-12-25 DIAGNOSIS — I255 Ischemic cardiomyopathy: Secondary | ICD-10-CM | POA: Diagnosis present

## 2019-12-25 DIAGNOSIS — Z79899 Other long term (current) drug therapy: Secondary | ICD-10-CM

## 2019-12-25 DIAGNOSIS — N390 Urinary tract infection, site not specified: Secondary | ICD-10-CM | POA: Clinically undetermined

## 2019-12-25 DIAGNOSIS — Z955 Presence of coronary angioplasty implant and graft: Secondary | ICD-10-CM

## 2019-12-25 DIAGNOSIS — Z6841 Body Mass Index (BMI) 40.0 and over, adult: Secondary | ICD-10-CM

## 2019-12-25 DIAGNOSIS — Z87442 Personal history of urinary calculi: Secondary | ICD-10-CM

## 2019-12-25 DIAGNOSIS — M109 Gout, unspecified: Secondary | ICD-10-CM | POA: Diagnosis not present

## 2019-12-25 DIAGNOSIS — I2510 Atherosclerotic heart disease of native coronary artery without angina pectoris: Secondary | ICD-10-CM | POA: Diagnosis present

## 2019-12-25 DIAGNOSIS — I5031 Acute diastolic (congestive) heart failure: Secondary | ICD-10-CM | POA: Diagnosis present

## 2019-12-25 DIAGNOSIS — R197 Diarrhea, unspecified: Secondary | ICD-10-CM | POA: Diagnosis not present

## 2019-12-25 DIAGNOSIS — F419 Anxiety disorder, unspecified: Secondary | ICD-10-CM | POA: Diagnosis not present

## 2019-12-25 DIAGNOSIS — I10 Essential (primary) hypertension: Secondary | ICD-10-CM | POA: Diagnosis present

## 2019-12-25 DIAGNOSIS — T502X5A Adverse effect of carbonic-anhydrase inhibitors, benzothiadiazides and other diuretics, initial encounter: Secondary | ICD-10-CM | POA: Diagnosis not present

## 2019-12-25 DIAGNOSIS — Z79891 Long term (current) use of opiate analgesic: Secondary | ICD-10-CM

## 2019-12-25 DIAGNOSIS — R0602 Shortness of breath: Secondary | ICD-10-CM | POA: Diagnosis not present

## 2019-12-25 DIAGNOSIS — Z841 Family history of disorders of kidney and ureter: Secondary | ICD-10-CM

## 2019-12-25 DIAGNOSIS — I5033 Acute on chronic diastolic (congestive) heart failure: Secondary | ICD-10-CM

## 2019-12-25 DIAGNOSIS — N281 Cyst of kidney, acquired: Secondary | ICD-10-CM | POA: Diagnosis present

## 2019-12-25 DIAGNOSIS — J9601 Acute respiratory failure with hypoxia: Secondary | ICD-10-CM

## 2019-12-25 DIAGNOSIS — N200 Calculus of kidney: Secondary | ICD-10-CM | POA: Diagnosis present

## 2019-12-25 DIAGNOSIS — R0902 Hypoxemia: Secondary | ICD-10-CM | POA: Diagnosis not present

## 2019-12-25 DIAGNOSIS — E861 Hypovolemia: Secondary | ICD-10-CM | POA: Diagnosis not present

## 2019-12-25 DIAGNOSIS — E1122 Type 2 diabetes mellitus with diabetic chronic kidney disease: Secondary | ICD-10-CM | POA: Diagnosis not present

## 2019-12-25 DIAGNOSIS — Z87891 Personal history of nicotine dependence: Secondary | ICD-10-CM

## 2019-12-25 DIAGNOSIS — R319 Hematuria, unspecified: Secondary | ICD-10-CM | POA: Diagnosis not present

## 2019-12-25 DIAGNOSIS — G8929 Other chronic pain: Secondary | ICD-10-CM | POA: Diagnosis not present

## 2019-12-25 DIAGNOSIS — D122 Benign neoplasm of ascending colon: Secondary | ICD-10-CM | POA: Diagnosis not present

## 2019-12-25 DIAGNOSIS — B9620 Unspecified Escherichia coli [E. coli] as the cause of diseases classified elsewhere: Secondary | ICD-10-CM | POA: Diagnosis present

## 2019-12-25 DIAGNOSIS — Z20822 Contact with and (suspected) exposure to covid-19: Secondary | ICD-10-CM | POA: Diagnosis not present

## 2019-12-25 DIAGNOSIS — E1169 Type 2 diabetes mellitus with other specified complication: Secondary | ICD-10-CM | POA: Diagnosis present

## 2019-12-25 DIAGNOSIS — Z8701 Personal history of pneumonia (recurrent): Secondary | ICD-10-CM

## 2019-12-25 DIAGNOSIS — D124 Benign neoplasm of descending colon: Secondary | ICD-10-CM | POA: Diagnosis not present

## 2019-12-25 DIAGNOSIS — Z85820 Personal history of malignant melanoma of skin: Secondary | ICD-10-CM

## 2019-12-25 DIAGNOSIS — K529 Noninfective gastroenteritis and colitis, unspecified: Secondary | ICD-10-CM | POA: Diagnosis present

## 2019-12-25 DIAGNOSIS — Z888 Allergy status to other drugs, medicaments and biological substances status: Secondary | ICD-10-CM

## 2019-12-25 DIAGNOSIS — D509 Iron deficiency anemia, unspecified: Secondary | ICD-10-CM | POA: Diagnosis present

## 2019-12-25 DIAGNOSIS — J9621 Acute and chronic respiratory failure with hypoxia: Secondary | ICD-10-CM

## 2019-12-25 DIAGNOSIS — K219 Gastro-esophageal reflux disease without esophagitis: Secondary | ICD-10-CM | POA: Diagnosis not present

## 2019-12-25 DIAGNOSIS — K635 Polyp of colon: Secondary | ICD-10-CM

## 2019-12-25 DIAGNOSIS — K2950 Unspecified chronic gastritis without bleeding: Secondary | ICD-10-CM | POA: Diagnosis not present

## 2019-12-25 DIAGNOSIS — J449 Chronic obstructive pulmonary disease, unspecified: Secondary | ICD-10-CM | POA: Diagnosis not present

## 2019-12-25 DIAGNOSIS — Z9981 Dependence on supplemental oxygen: Secondary | ICD-10-CM

## 2019-12-25 DIAGNOSIS — E66813 Obesity, class 3: Secondary | ICD-10-CM

## 2019-12-25 DIAGNOSIS — E1151 Type 2 diabetes mellitus with diabetic peripheral angiopathy without gangrene: Secondary | ICD-10-CM | POA: Diagnosis present

## 2019-12-25 DIAGNOSIS — I252 Old myocardial infarction: Secondary | ICD-10-CM | POA: Diagnosis not present

## 2019-12-25 DIAGNOSIS — N1832 Chronic kidney disease, stage 3b: Secondary | ICD-10-CM

## 2019-12-25 DIAGNOSIS — Z794 Long term (current) use of insulin: Secondary | ICD-10-CM

## 2019-12-25 DIAGNOSIS — Z96653 Presence of artificial knee joint, bilateral: Secondary | ICD-10-CM | POA: Diagnosis present

## 2019-12-25 DIAGNOSIS — K449 Diaphragmatic hernia without obstruction or gangrene: Secondary | ICD-10-CM

## 2019-12-25 DIAGNOSIS — G4733 Obstructive sleep apnea (adult) (pediatric): Secondary | ICD-10-CM | POA: Diagnosis present

## 2019-12-25 DIAGNOSIS — Z7982 Long term (current) use of aspirin: Secondary | ICD-10-CM

## 2019-12-25 DIAGNOSIS — J90 Pleural effusion, not elsewhere classified: Secondary | ICD-10-CM | POA: Diagnosis not present

## 2019-12-25 DIAGNOSIS — Z881 Allergy status to other antibiotic agents status: Secondary | ICD-10-CM

## 2019-12-25 DIAGNOSIS — I119 Hypertensive heart disease without heart failure: Secondary | ICD-10-CM | POA: Diagnosis not present

## 2019-12-25 DIAGNOSIS — Z8249 Family history of ischemic heart disease and other diseases of the circulatory system: Secondary | ICD-10-CM

## 2019-12-25 DIAGNOSIS — T501X6A Underdosing of loop [high-ceiling] diuretics, initial encounter: Secondary | ICD-10-CM | POA: Diagnosis present

## 2019-12-25 HISTORY — DX: Other complications of anesthesia, initial encounter: T88.59XA

## 2019-12-25 HISTORY — PX: POLYPECTOMY: SHX5525

## 2019-12-25 HISTORY — PX: COLONOSCOPY WITH PROPOFOL: SHX5780

## 2019-12-25 HISTORY — PX: BIOPSY: SHX5522

## 2019-12-25 HISTORY — PX: ESOPHAGOGASTRODUODENOSCOPY (EGD) WITH PROPOFOL: SHX5813

## 2019-12-25 HISTORY — DX: Presence of dental prosthetic device (complete) (partial): Z97.2

## 2019-12-25 HISTORY — DX: Dependence on supplemental oxygen: Z99.81

## 2019-12-25 LAB — HEMOGLOBIN A1C
Hgb A1c MFr Bld: 6.3 % — ABNORMAL HIGH (ref 4.8–5.6)
Mean Plasma Glucose: 134.11 mg/dL

## 2019-12-25 LAB — CREATININE, SERUM
Creatinine, Ser: 1.56 mg/dL — ABNORMAL HIGH (ref 0.44–1.00)
GFR calc Af Amer: 36 mL/min — ABNORMAL LOW (ref >60)
GFR calc non Af Amer: 31 mL/min — ABNORMAL LOW (ref >60)

## 2019-12-25 LAB — CBC
HCT: 32.3 % — ABNORMAL LOW (ref 36.0–46.0)
Hemoglobin: 10.2 g/dL — ABNORMAL LOW (ref 12.0–15.0)
MCH: 34.2 pg — ABNORMAL HIGH (ref 26.0–34.0)
MCHC: 31.6 g/dL (ref 30.0–36.0)
MCV: 108.4 fL — ABNORMAL HIGH (ref 80.0–100.0)
Platelets: 130 10*3/uL — ABNORMAL LOW (ref 150–400)
RBC: 2.98 MIL/uL — ABNORMAL LOW (ref 3.87–5.11)
RDW: 15.7 % — ABNORMAL HIGH (ref 11.5–15.5)
WBC: 2.3 10*3/uL — ABNORMAL LOW (ref 4.0–10.5)
nRBC: 0 % (ref 0.0–0.2)

## 2019-12-25 LAB — CBG MONITORING, ED: Glucose-Capillary: 164 mg/dL — ABNORMAL HIGH (ref 70–99)

## 2019-12-25 LAB — GLUCOSE, CAPILLARY: Glucose-Capillary: 200 mg/dL — ABNORMAL HIGH (ref 70–99)

## 2019-12-25 SURGERY — COLONOSCOPY WITH PROPOFOL
Anesthesia: Monitor Anesthesia Care

## 2019-12-25 MED ORDER — NITROGLYCERIN 0.4 MG SL SUBL: 0.4000 mg | SUBLINGUAL_TABLET | SUBLINGUAL | Status: DC | PRN

## 2019-12-25 MED ORDER — ATORVASTATIN CALCIUM 40 MG PO TABS: 80.0000 mg | ORAL_TABLET | Freq: Every day | ORAL | Status: DC

## 2019-12-25 MED ORDER — INSULIN ASPART 100 UNIT/ML ~~LOC~~ SOLN
0.0000 [IU] | Freq: Three times a day (TID) | SUBCUTANEOUS | Status: DC
  Administered 2019-12-26: 5 [IU] via SUBCUTANEOUS
  Administered 2019-12-26: 3 [IU] via SUBCUTANEOUS
  Administered 2019-12-26: 5 [IU] via SUBCUTANEOUS
  Administered 2019-12-27 – 2019-12-28 (×4): 3 [IU] via SUBCUTANEOUS
  Administered 2019-12-28: 2 [IU] via SUBCUTANEOUS
  Administered 2019-12-28: 8 [IU] via SUBCUTANEOUS
  Administered 2019-12-29 (×2): 3 [IU] via SUBCUTANEOUS

## 2019-12-25 MED ORDER — ONDANSETRON HCL 4 MG/2ML IJ SOLN
4.0000 mg | Freq: Four times a day (QID) | INTRAMUSCULAR | Status: DC | PRN
Start: 2019-12-25 — End: 2019-12-25

## 2019-12-25 MED ORDER — TAMSULOSIN HCL 0.4 MG PO CAPS
0.4000 mg | ORAL_CAPSULE | Freq: Every day | ORAL | Status: DC
  Administered 2019-12-25 – 2019-12-29 (×5): 0.4 mg via ORAL
  Filled 2019-12-25 (×5): qty 1

## 2019-12-25 MED ORDER — GABAPENTIN 100 MG PO CAPS
200.0000 mg | ORAL_CAPSULE | Freq: Two times a day (BID) | ORAL | Status: DC
  Administered 2019-12-25 – 2019-12-29 (×8): 200 mg via ORAL
  Filled 2019-12-25 (×8): qty 2

## 2019-12-25 MED ORDER — ONDANSETRON HCL 4 MG/2ML IJ SOLN: 4.0000 mg | Freq: Four times a day (QID) | INTRAMUSCULAR | Status: DC | PRN

## 2019-12-25 MED ORDER — BUPROPION HCL ER (XL) 300 MG PO TB24
300.0000 mg | ORAL_TABLET | Freq: Every day | ORAL | Status: DC
  Administered 2019-12-25 – 2019-12-29 (×5): 300 mg via ORAL
  Filled 2019-12-25 (×5): qty 1

## 2019-12-25 MED ORDER — ASPIRIN 81 MG PO TBEC: 81.0000 mg | DELAYED_RELEASE_TABLET | Freq: Every day | ORAL | Status: DC

## 2019-12-25 MED ORDER — ONDANSETRON HCL 4 MG PO TABS: 4.0000 mg | ORAL_TABLET | Freq: Four times a day (QID) | ORAL | Status: DC | PRN

## 2019-12-25 MED ORDER — LOSARTAN POTASSIUM 50 MG PO TABS
50.0000 mg | ORAL_TABLET | Freq: Every day | ORAL | Status: DC
  Filled 2019-12-25: qty 1

## 2019-12-25 MED ORDER — SODIUM CHLORIDE 0.9% FLUSH: 3.0000 mL | INTRAVENOUS | Status: DC | PRN

## 2019-12-25 MED ORDER — POLYETHYLENE GLYCOL 3350 17 G PO PACK: 17.0000 g | PACK | Freq: Every day | ORAL | Status: DC

## 2019-12-25 MED ORDER — FUROSEMIDE 10 MG/ML IJ SOLN: 80.0000 mg | Freq: Two times a day (BID) | INTRAMUSCULAR | Status: DC

## 2019-12-25 MED ORDER — GABAPENTIN 100 MG PO CAPS: 200.0000 mg | ORAL_CAPSULE | Freq: Two times a day (BID) | ORAL | Status: DC

## 2019-12-25 MED ORDER — SODIUM CHLORIDE 0.9 % IV SOLN: INTRAVENOUS | Status: DC

## 2019-12-25 MED ORDER — PANTOPRAZOLE SODIUM 40 MG PO TBEC: 40.0000 mg | DELAYED_RELEASE_TABLET | Freq: Two times a day (BID) | ORAL | Status: DC

## 2019-12-25 MED ORDER — ATORVASTATIN CALCIUM 40 MG PO TABS
80.0000 mg | ORAL_TABLET | Freq: Every day | ORAL | Status: DC
  Administered 2019-12-26 – 2019-12-28 (×3): 80 mg via ORAL
  Filled 2019-12-25 (×3): qty 2

## 2019-12-25 MED ORDER — FUROSEMIDE 10 MG/ML IJ SOLN
40.0000 mg | Freq: Once | INTRAMUSCULAR | Status: AC
  Administered 2019-12-25: 40 mg via INTRAVENOUS

## 2019-12-25 MED ORDER — ORAL CARE MOUTH RINSE
15.0000 mL | Freq: Two times a day (BID) | OROMUCOSAL | Status: DC
  Administered 2019-12-26 – 2019-12-29 (×6): 15 mL via OROMUCOSAL

## 2019-12-25 MED ORDER — HYDROCODONE-ACETAMINOPHEN 5-325 MG PO TABS: 2.0000 | ORAL_TABLET | ORAL | Status: DC | PRN

## 2019-12-25 MED ORDER — PHENYLEPHRINE 40 MCG/ML (10ML) SYRINGE FOR IV PUSH (FOR BLOOD PRESSURE SUPPORT)
PREFILLED_SYRINGE | INTRAVENOUS | Status: DC | PRN
  Administered 2019-12-25: 40 ug via INTRAVENOUS

## 2019-12-25 MED ORDER — FUROSEMIDE 10 MG/ML IJ SOLN
INTRAMUSCULAR | Status: AC
  Filled 2019-12-25: qty 4

## 2019-12-25 MED ORDER — INSULIN ASPART 100 UNIT/ML ~~LOC~~ SOLN
0.0000 [IU] | Freq: Every day | SUBCUTANEOUS | Status: DC
Start: 2019-12-25 — End: 2019-12-25

## 2019-12-25 MED ORDER — LACTATED RINGERS IV SOLN: INTRAVENOUS | Status: DC

## 2019-12-25 MED ORDER — INSULIN ASPART 100 UNIT/ML ~~LOC~~ SOLN
3.0000 [IU] | Freq: Three times a day (TID) | SUBCUTANEOUS | Status: DC
  Administered 2019-12-26 – 2019-12-29 (×5): 3 [IU] via SUBCUTANEOUS

## 2019-12-25 MED ORDER — PROPOFOL 10 MG/ML IV BOLUS
INTRAVENOUS | Status: DC | PRN
  Administered 2019-12-25: 40 mg via INTRAVENOUS
  Administered 2019-12-25 (×2): 10 mg via INTRAVENOUS

## 2019-12-25 MED ORDER — SODIUM CHLORIDE 0.9 % IV SOLN: 250.0000 mL | INTRAVENOUS | Status: DC | PRN

## 2019-12-25 MED ORDER — ESCITALOPRAM OXALATE 20 MG PO TABS
20.0000 mg | ORAL_TABLET | Freq: Every day | ORAL | Status: DC
  Administered 2019-12-25 – 2019-12-29 (×5): 20 mg via ORAL
  Filled 2019-12-25 (×5): qty 1

## 2019-12-25 MED ORDER — PANTOPRAZOLE SODIUM 40 MG PO TBEC
40.0000 mg | DELAYED_RELEASE_TABLET | Freq: Two times a day (BID) | ORAL | Status: DC
  Administered 2019-12-26 – 2019-12-29 (×7): 40 mg via ORAL
  Filled 2019-12-25 (×7): qty 1

## 2019-12-25 MED ORDER — METOPROLOL SUCCINATE ER 50 MG PO TB24
25.0000 mg | ORAL_TABLET | Freq: Every day | ORAL | Status: DC
  Administered 2019-12-25 – 2019-12-27 (×3): 25 mg via ORAL
  Filled 2019-12-25 (×4): qty 1

## 2019-12-25 MED ORDER — ENOXAPARIN SODIUM 40 MG/0.4ML ~~LOC~~ SOLN
40.0000 mg | SUBCUTANEOUS | Status: DC
  Administered 2019-12-25 – 2019-12-26 (×2): 40 mg via SUBCUTANEOUS
  Filled 2019-12-25 (×2): qty 0.4

## 2019-12-25 MED ORDER — LOSARTAN POTASSIUM 50 MG PO TABS: 50.0000 mg | ORAL_TABLET | Freq: Every day | ORAL | Status: DC

## 2019-12-25 MED ORDER — INSULIN ASPART 100 UNIT/ML ~~LOC~~ SOLN: 3.0000 [IU] | Freq: Three times a day (TID) | SUBCUTANEOUS | Status: DC

## 2019-12-25 MED ORDER — ALBUTEROL SULFATE (2.5 MG/3ML) 0.083% IN NEBU
2.5000 mg | INHALATION_SOLUTION | RESPIRATORY_TRACT | Status: DC
  Administered 2019-12-25: 2.5 mg via RESPIRATORY_TRACT
  Filled 2019-12-25: qty 3

## 2019-12-25 MED ORDER — INSULIN DETEMIR 100 UNIT/ML ~~LOC~~ SOLN
35.0000 [IU] | Freq: Two times a day (BID) | SUBCUTANEOUS | Status: DC
  Administered 2019-12-25 – 2019-12-29 (×8): 35 [IU] via SUBCUTANEOUS
  Filled 2019-12-25 (×9): qty 0.35

## 2019-12-25 MED ORDER — ACETAMINOPHEN 325 MG PO TABS
650.0000 mg | ORAL_TABLET | Freq: Four times a day (QID) | ORAL | Status: DC | PRN
  Administered 2019-12-26 – 2019-12-27 (×2): 650 mg via ORAL
  Filled 2019-12-25 (×2): qty 2

## 2019-12-25 MED ORDER — POLYETHYLENE GLYCOL 400 0.25 % OP SOLN: Freq: Every day | OPHTHALMIC | Status: DC | PRN

## 2019-12-25 MED ORDER — ACETAMINOPHEN 325 MG PO TABS
650.0000 mg | ORAL_TABLET | ORAL | Status: DC | PRN
Start: 2019-12-25 — End: 2019-12-25

## 2019-12-25 MED ORDER — ROPINIROLE HCL 1 MG PO TABS: 6.0000 mg | ORAL_TABLET | Freq: Three times a day (TID) | ORAL | Status: DC

## 2019-12-25 MED ORDER — BUPROPION HCL ER (XL) 150 MG PO TB24: 300.0000 mg | ORAL_TABLET | Freq: Every day | ORAL | Status: DC

## 2019-12-25 MED ORDER — SODIUM CHLORIDE 0.9% FLUSH: 3.0000 mL | Freq: Two times a day (BID) | INTRAVENOUS | Status: DC

## 2019-12-25 MED ORDER — TIZANIDINE HCL 4 MG PO TABS: 4.0000 mg | ORAL_TABLET | Freq: Every evening | ORAL | Status: DC | PRN

## 2019-12-25 MED ORDER — IPRATROPIUM-ALBUTEROL 0.5-2.5 (3) MG/3ML IN SOLN
RESPIRATORY_TRACT | Status: AC
  Filled 2019-12-25: qty 3

## 2019-12-25 MED ORDER — ROPINIROLE HCL 1 MG PO TABS
6.0000 mg | ORAL_TABLET | Freq: Two times a day (BID) | ORAL | Status: DC
  Administered 2019-12-25 – 2019-12-29 (×7): 6 mg via ORAL
  Filled 2019-12-25 (×7): qty 6

## 2019-12-25 MED ORDER — INSULIN ASPART 100 UNIT/ML ~~LOC~~ SOLN
0.0000 [IU] | Freq: Three times a day (TID) | SUBCUTANEOUS | Status: DC
Start: 2019-12-25 — End: 2019-12-25

## 2019-12-25 MED ORDER — PROPOFOL 500 MG/50ML IV EMUL
INTRAVENOUS | Status: DC | PRN
  Administered 2019-12-25: 100 ug/kg/min via INTRAVENOUS

## 2019-12-25 MED ORDER — INSULIN ASPART 100 UNIT/ML ~~LOC~~ SOLN: 0.0000 [IU] | Freq: Every day | SUBCUTANEOUS | Status: DC

## 2019-12-25 MED ORDER — CHLORHEXIDINE GLUCONATE 0.12 % MT SOLN
15.0000 mL | Freq: Two times a day (BID) | OROMUCOSAL | Status: DC
  Administered 2019-12-25 – 2019-12-29 (×8): 15 mL via OROMUCOSAL
  Filled 2019-12-25 (×9): qty 15

## 2019-12-25 MED ORDER — INSULIN DETEMIR 100 UNIT/ML ~~LOC~~ SOLN: 30.0000 [IU] | Freq: Two times a day (BID) | SUBCUTANEOUS | Status: DC

## 2019-12-25 MED ORDER — ACETAMINOPHEN 650 MG RE SUPP: 650.0000 mg | Freq: Four times a day (QID) | RECTAL | Status: DC | PRN

## 2019-12-25 MED ORDER — BUDESON-GLYCOPYRROL-FORMOTEROL 160-9-4.8 MCG/ACT IN AERO: 2.0000 | INHALATION_SPRAY | Freq: Two times a day (BID) | RESPIRATORY_TRACT | Status: DC

## 2019-12-25 MED ORDER — EPHEDRINE SULFATE-NACL 50-0.9 MG/10ML-% IV SOSY
PREFILLED_SYRINGE | INTRAVENOUS | Status: DC | PRN
  Administered 2019-12-25: 7.5 mg via INTRAVENOUS
  Administered 2019-12-25: 5 mg via INTRAVENOUS

## 2019-12-25 MED ORDER — POLYETHYLENE GLYCOL 3350 17 G PO PACK: 17.0000 g | PACK | Freq: Every day | ORAL | Status: DC | PRN

## 2019-12-25 MED ORDER — ALBUTEROL SULFATE (2.5 MG/3ML) 0.083% IN NEBU: 3.0000 mL | INHALATION_SOLUTION | RESPIRATORY_TRACT | Status: DC | PRN

## 2019-12-25 MED ORDER — METOPROLOL SUCCINATE ER 50 MG PO TB24: 25.0000 mg | ORAL_TABLET | Freq: Every day | ORAL | Status: DC

## 2019-12-25 MED ORDER — ESCITALOPRAM OXALATE 10 MG PO TABS: 20.0000 mg | ORAL_TABLET | Freq: Every day | ORAL | Status: DC

## 2019-12-25 MED ORDER — SODIUM CHLORIDE 0.9% FLUSH
3.0000 mL | Freq: Two times a day (BID) | INTRAVENOUS | Status: DC
  Administered 2019-12-25: 3 mL via INTRAVENOUS

## 2019-12-25 MED ORDER — ASPIRIN EC 81 MG PO TBEC
81.0000 mg | DELAYED_RELEASE_TABLET | Freq: Every day | ORAL | Status: DC
  Administered 2019-12-25 – 2019-12-29 (×5): 81 mg via ORAL
  Filled 2019-12-25 (×5): qty 1

## 2019-12-25 MED ORDER — FUROSEMIDE 10 MG/ML IJ SOLN
80.0000 mg | Freq: Two times a day (BID) | INTRAMUSCULAR | Status: DC
  Administered 2019-12-25: 80 mg via INTRAVENOUS
  Filled 2019-12-25 (×2): qty 8

## 2019-12-25 MED ORDER — POLYVINYL ALCOHOL 1.4 % OP SOLN
1.0000 [drp] | Freq: Every day | OPHTHALMIC | Status: DC | PRN
  Filled 2019-12-25: qty 15

## 2019-12-25 MED ORDER — LIDOCAINE 2% (20 MG/ML) 5 ML SYRINGE
INTRAMUSCULAR | Status: DC | PRN
  Administered 2019-12-25: 60 mg via INTRAVENOUS

## 2019-12-25 MED ORDER — HYDROCODONE-ACETAMINOPHEN 5-325 MG PO TABS
2.0000 | ORAL_TABLET | ORAL | Status: DC | PRN
  Administered 2019-12-27: 1 via ORAL
  Administered 2019-12-28: 2 via ORAL
  Filled 2019-12-25 (×2): qty 2

## 2019-12-25 MED ORDER — IPRATROPIUM-ALBUTEROL 0.5-2.5 (3) MG/3ML IN SOLN
3.0000 mL | RESPIRATORY_TRACT | Status: AC
  Administered 2019-12-25: 3 mL via RESPIRATORY_TRACT

## 2019-12-25 SURGICAL SUPPLY — 25 items

## 2019-12-25 NOTE — Transfer of Care (Addendum)
Immediate Anesthesia Transfer of Care Note  Patient: LORANN TANI  Procedure(s) Performed: Procedure(s): COLONOSCOPY WITH PROPOFOL (N/A) ESOPHAGOGASTRODUODENOSCOPY (EGD) WITH PROPOFOL (N/A) BIOPSY POLYPECTOMY  Patient Location: PACU  Anesthesia Type:MAC  Level of Consciousness: Patient easily awoken, sedated, comfortable, cooperative, following commands, responds to stimulation.   Airway & Oxygen Therapy: Patient spontaneously breathing, ventilating well, oxygen via simple oxygen mask for transport, given duoneb upon arrival to pacu and then placed on nonrebreather, sats remain in 80's.  Post-op Assessment: Report given to PACU RN, vital signs reviewed and stable, sats remain in 80's but steadily climbing, moving all extremities.   Post vital signs: Reviewed and stable.  Complications: No apparent anesthesia complications  Last Vitals:  Vitals Value Taken Time  BP 101/61 12/25/19 1340  Temp    Pulse 81 12/25/19 1346  Resp 29 12/25/19 1346  SpO2 96 % 12/25/19 1346  Vitals shown include unvalidated device data.  Last Pain:  Vitals:   12/25/19 1250  TempSrc:   PainSc: 0-No pain         Complications: No complications documented.

## 2019-12-25 NOTE — Telephone Encounter (Signed)
I looked up this charge it looks like insurance paid it and we had a auth from both insurance companies so im not sure what is going on I will try to contact the patient tomorrow

## 2019-12-25 NOTE — Op Note (Addendum)
[facility] Patient Name: Janet Choi Procedure Date: 12/25/2019 MRN: [id number] Attending MD: Gerrit Heck , MD Date of Birth: 1940/10/03 CSN: [id number] Age: 79 Admit Type: Outpatient Procedure:                Colonoscopy Indications:              Iron deficiency anemia, Chronic diarrhea Providers:                Gerrit Heck, MD, Carlyn Reichert, RN, Fransico Setters                            [place], Technician Referring MD:              Medicines:                Monitored Anesthesia Care Complications:            No immediate complications. Estimated Blood Loss:     Estimated blood loss was minimal. Procedure:                Pre-Anesthesia Assessment:                           - Prior to the procedure, a History and Physical                            was performed, and patient medications and                            allergies were reviewed. The patient's tolerance of                            previous anesthesia was also reviewed. The risks                            and benefits of the procedure and the sedation                            options and risks were discussed with the patient.                            All questions were answered, and informed consent                            was obtained. Prior Anticoagulants: The patient has                            taken no previous anticoagulant or antiplatelet                            agents except for aspirin. ASA Grade Assessment:                            III - A patient with severe systemic disease. After  reviewing the risks and benefits, the patient was                            deemed in satisfactory condition to undergo the                            procedure.                           After obtaining informed consent, the colonoscope                            was passed under direct vision. Throughout the                            procedure, the patient's blood  pressure, pulse, and                            oxygen saturations were monitored continuously. The                            CF-HQ190L ([id number]) Olympus colonoscope was                            introduced through the anus and advanced to the the                            cecum, identified by appendiceal orifice and                            ileocecal valve. The colonoscopy was technically                            difficult and complex due to multiple diverticula                            in the colon and a tortuous colon. Successful                            completion of the procedure was aided by using                            manual pressure. The patient tolerated the                            procedure well. The quality of the bowel                            preparation was adequate. The ileocecal valve,                            appendiceal orifice, and rectum were photographed. Scope In: 12:07:19 PM Scope Out: 12:36:03 PM Scope Withdrawal Time: 0 hours 17 minutes 57 seconds  Total Procedure Duration: 0 hours 28 minutes  44 seconds  Findings:      The perianal and digital rectal examinations were normal.      Three sessile polyps were found in the sigmoid colon (2) and ascending       colon. The polyps were 3 to 4 mm in size. These polyps were removed with       a cold snare. Resection and retrieval were complete. Estimated blood       loss was minimal.      Multiple small and large-mouthed diverticula with localized mucosal       hypertrophy were found in the sigmoid colon.      The sigmoid colon was significantly tortuous.      Retroflexion in the rectum was not performed due to anatomy. Anterograde       views were otherwise normal.      The mucosa was otherwise normal appearing throughou the remainder of the       colon. Biopsies for histology were taken with a cold forceps from the       right colon and left colon for evaluation of microscopic colitis.        Estimated blood loss was minimal. Impression:               - Three 3 to 4 mm polyps in the sigmoid colon and                            in the ascending colon, removed with a cold snare.                            Resected and retrieved.                           - Diverticulosis in the sigmoid colon.                           - Tortuous colon.                           - Normal mucosa in the entire examined colon.                            Biopsied. Moderate Sedation:      Not Applicable - Patient had care per Anesthesia. Recommendation:           - Patient has a contact number available for                            emergencies. The signs and symptoms of potential                            delayed complications were discussed with the                            patient. Return to normal activities tomorrow.                            Written discharge instructions were provided to the  patient.                           - Resume previous diet.                           - Continue present medications.                           - Await pathology results.                           - Continue present medications.                           - Use fiber, for example Citrucel, Fibercon, Konsyl                            or Metamucil.                           - Return to GI clinic in 3 months.                           - Continue follow-up in the Hematology Clinic. If                            iron deficiency anemia not improving, will consider                            further small bowel interrogation with Video                            Capsule Endoscopy.                           - For ongoing polyp surveillance, can consider                            alternative screening strategies given the                            significantly tortuous sigmoid colon and                            co-morbidities. Procedure Code(s):        --- Professional ---                            [phone], Colonoscopy, flexible; with removal of                            tumor(s), polyp(s), or other lesion(s) by snare                            technique  62694, 61, Colonoscopy, flexible; with biopsy,                            single or multiple Diagnosis Code(s):        --- Professional ---                           K63.5, Polyp of colon                           D50.9, Iron deficiency anemia, unspecified                           K52.9, Noninfective gastroenteritis and colitis,                            unspecified                           K57.30, Diverticulosis of large intestine without                            perforation or abscess without bleeding                           Q43.8, Other specified congenital malformations of                            intestine CPT copyright 2019 [employer]. All rights reserved. The codes documented in this report are preliminary and upon coder review may  be revised to meet current compliance requirements. Gerrit Heck, MD 12/25/2019 12:54:01 PM Number of Addenda: 0

## 2019-12-25 NOTE — Progress Notes (Signed)
Pt transported from [facility] to room 1413.  Pt remained on BiPAP and remained stable and comfortable throughout trip.

## 2019-12-25 NOTE — Progress Notes (Signed)
EGD/Colonoscopy completed earlier today. No aspiration events. EGD was completed first and there was no fluid/food debris in the stomach. Did have increased O2 demand, but responsive to supplemental O2 and jaw thrust.   In recovery, oxygen sats remained in the low 80's after the procedure, despite supplemental O2. Improved with 85% with NRB. Was given Lasix 40 mg IV and breathing treatment by Anesthesia service, but O2 sats remained in the low 80's. Otherwise alert and conversive.   - CXR and iStat ordered  - I contacted [facility] for hospital admission and discussed with PCCM for awareness - I discussed these events with her sister by phone. All questions answered - Due to bed shortage, will transport patient to ER for ongoing medical care until bed opens for admission

## 2019-12-25 NOTE — H&P (Signed)
Chief Complaint: Iron deficiency anemia, chronic diarrhea  Referring Provider:   Alonza Bogus PA-C, Wilfrid Lund, MD      HPI:     Janet Choi is a 79 y.o. female referred to the Gastroenterology Clinic for evaluation of chronic watery, nonbloody diarrhea along with iron deficiency anemia.  Follows in the Hematology clinic for pancytopenia, with improvement in H/H and iron indices with supplemental iron therapy.  Ongoing chronic diarrhea.  Also follows in the Pulmonary clinic for OSA and COPD overlap, recently started on supplemental O2.  Was most recently seen in the GI clinic by Alonza Bogus on 10/25/2019 for evaluation of the above, with plan for EGD/colonoscopy to be done at [facility] due to comorbidities, including supplemental O2 therapy.  She presents today for EGD/colonoscopy.  Had evaluation in 2008-2010 for iron deficiency anemia at that time.  Looks like she underwent EGD, colonoscopy, and wireless capsule endoscopy.  Last procedures were December 2010 colonoscopy which showed moderate diverticulosis in the sigmoid to descending colon.  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  Wireless capsule endoscopy performed April 2008 showed a fair prep, few tiny red spots, and distal ileal erythema that was nonspecific.  In regards to the diarrhea, that has been chronic as well as stated above at least dating back to 2017.  She feels that it has worsened over time.  She does not have diarrhea every day, but some days she has diarrhea several times a day until it is just water.  When this occurs she has severe urgency with the diarrhea and multiple episodes of incontinence.  Reports abdominal pain usually in the left lower quadrant.   Past Medical History:  Diagnosis Date  . Anemia    hx (04/14/2016)  . Anxiety   . Arthritis    "severe in my back; hands; ankles" (04/14/2016)  . Atypical chest pain 01/24/2018   Midline pain absent supine "constant" daytime since  12/31/17 > resolved as of 02/20/2018 on gerd/ gas diet   . Basal cell carcinoma    "several burned off; one cut off"  . CAD in native artery    a. NSTEMI 04/2016 - s/p DES toLAD and LCx. PCI to LCx notable for microembolization during cath.  . Chest pain- reslved with stopping Brilinta now on Plaix 04/16/2016  . Chronic lower back pain   . CKD (chronic kidney disease), stage III    stage 3  . COPD (chronic obstructive pulmonary disease) ([place])   . Depression   . Diverticulosis   . DJD (degenerative joint disease)   . Family history of adverse reaction to anesthesia    "half-sister used to get real sick" (04/14/2016)  . Gallstones   . GERD (gastroesophageal reflux disease)   . History of gout   . History of kidney stones   . Hyperlipidemia   . Hypertension   . IBS (irritable bowel syndrome)   . Ischemic cardiomyopathy    a. 04/2016: EF 40-50% by cath, 50-55% +WMA by echo.  . 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 ([place]) ~ 2015  . Morbid obesity ([place])   . NSTEMI (non-ST elevated myocardial infarction) ([place]) 04/14/2016  . Obstructive sleep apnea of adult    appt for cpap 8-8 to adjust settings no cpap at this time  . On supplemental oxygen therapy    uses at bedtime or exposed to heat, 2Liters  . Peripheral neuropathy    feet  and toes  . Peripheral vascular disease ([place])   . RLS (restless legs syndrome)   . Spinal stenosis   . Type II diabetes mellitus ([place])   . Vitamin D deficiency   . Walking pneumonia   . Wears partial dentures    lower     Past Surgical History:  Procedure Laterality Date  . APPENDECTOMY    . BASAL CELL CARCINOMA EXCISION Left    leg  . CARDIAC CATHETERIZATION N/A 04/15/2016   Procedure: Left Heart Cath and Coronary Angiography;  Surgeon: Belva Crome, MD;  Location: [place] [facility];  Service: Cardiovascular;  Laterality: N/A;  . CARDIAC CATHETERIZATION N/A 04/15/2016   Procedure: Coronary Stent Intervention;  Surgeon: Belva Crome, MD;  Location:  [place] [facility];  Service: Cardiovascular;  Laterality: N/A;  Mid LAD Mid CFX  . CARPAL TUNNEL RELEASE Right    with trigger finger release  . CATARACT EXTRACTION, BILATERAL Bilateral   . DEBRIDEMENT TENNIS ELBOW Right   . LUMBAR DISC SURGERY  X 2  . MELANOMA EXCISION Left    "towards the back of my neck"  . SHOULDER ARTHROSCOPY W/ ROTATOR CUFF REPAIR Left   . SHOULDER OPEN ROTATOR CUFF REPAIR Right   . TOTAL KNEE ARTHROPLASTY Bilateral    Family History  Problem Relation Age of Onset  . Heart disease Mother   . Heart attack Mother   . Kidney disease Father   . AAA (abdominal aortic aneurysm) Father   . Parkinson's disease Father   . Cancer Brother        type unknown  . Heart disease Son   . Cancer Sister        female   . Alzheimer's disease Sister   . Colon cancer Neg Hx   . Colon polyps Neg Hx   . Esophageal cancer Neg Hx   . Pancreatic cancer Neg Hx   . Stomach cancer Neg Hx   . Liver disease Neg Hx   . Diabetes Neg Hx    Social History   Tobacco Use  . Smoking status: Former Smoker    Packs/day: 2.50    Years: 25.00    Pack years: 62.50    Types: Cigarettes    Quit date: 11/11/1983    Years since quitting: 36.1  . Smokeless tobacco: Never Used  Vaping Use  . Vaping Use: Never used  Substance Use Topics  . Alcohol use: No    Alcohol/week: 0.0 standard drinks  . Drug use: No   No current facility-administered medications for this encounter.   Allergies  Allergen Reactions  . Brilinta [Ticagrelor] Shortness Of Breath and Other (See Comments)    Chest pain (also)  . Aspirin Other (See Comments)    Can tolerate in small doses (is already on a blood thinner AND has kidney disease)  . Nsaids Other (See Comments)    Patient is taking a blood thinner and has kidney disease  . Minocycline Hcl Other (See Comments)    Welts  . Oruvail [Ketoprofen] Other (See Comments)    Has kidney disease and is taking a blood thinner  . Tricor [Fenofibrate]     Reaction  unknown  . Vasotec [Enalaprilat]     Reaction unknown  . Zinc     Reaction unknown     Review of Systems: All systems reviewed and negative except where noted in HPI.     Physical Exam:    Wt Readings from Last 3 Encounters:  12/20/19 110.2 kg  12/16/19 111.1 kg  12/06/19 (!) 112 kg    Ht 5\' 1"  (1.549 m)   Wt 110.2 kg   BMI 45.91 kg/m  Constitutional:  Pleasant, in no acute distress. Psychiatric: Normal mood and affect. Behavior is normal. EENT: Pupils normal.  Conjunctivae are normal. No scleral icterus. Neck supple. No cervical LAD. Cardiovascular: Normal rate, regular rhythm. No edema Pulmonary/chest: Effort normal and breath sounds normal. No wheezing, rales or rhonchi. Abdominal: Soft, nondistended, nontender. Bowel sounds active throughout. There are no masses palpable. No hepatomegaly. Neurological: Alert and oriented to person place and time. Skin: Skin is warm and dry. No rashes noted.   ASSESSMENT AND PLAN;   1) Iron deficiency anemia 2) Diarrhea (chronic) with urgency 3) COPD/OSA, supplemental oxygen 4) Pancytopenia 5) Diabetes  -Plan for EGD with duodenal biopsies and colonoscopy with random and directed biopsies for evaluation of both chronic diarrhea and iron deficiency anemia -Discussed potential therapeutic intent during today's procedures as well   Darrel Baroni V Garvey Westcott, DO, FACG  12/25/2019, 10:16 AM   No ref. provider found

## 2019-12-25 NOTE — H&P (Signed)
History and Physical  Janet Choi ZYS:[id number] DOB: 05/27/1940 DOA: 12/25/2019  PCP: [name], MD Patient coming from: endo suite  I have personally briefly reviewed patient's old medical records in [place]   Chief Complaint: hypoxia  HPI: Janet Choi is a 79 y.o. 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 who was getting a colonoscopy and endoscopy for pancytopenia at the [facility] long endoscopy suite when she started getting hypoxic, was placed on a BiPAP and her saturations improved, chest x-ray was done that shows pleural effusions cardiomegaly and bilateral interstitial infiltrates, she had a CT scan on 12/06/2019 that showed no effusions and no pulmonary edema so we are consulted for further evaluation and admission, I have asked them to transfer the patient to the emergency room as we do not have any beds and we have multiple patients waiting at [facility] long ED and she needs a closer care, will continue her on BiPAP.   Review of Systems: All systems reviewed and apart from history of presenting illness, are negative.  Past Medical History:  Diagnosis Date   Anemia    hx (04/14/2016)   Anxiety    Arthritis    "severe in my back; hands; ankles" (04/14/2016)   Atypical chest pain 01/24/2018   Midline pain absent supine "constant" daytime since 12/31/17 > resolved as of 02/20/2018 on gerd/ gas diet    Basal cell carcinoma    "several burned off; one cut off"   CAD in native artery    a. NSTEMI 04/2016 - s/p DES toLAD and LCx. PCI to LCx notable for microembolization during cath.   Chest pain- reslved with stopping Brilinta now on Plaix 04/16/2016   Chronic lower back pain    CKD (chronic kidney disease), stage III    stage 3   Complication of anesthesia    COPD (chronic  obstructive pulmonary disease) (HCC)    Depression    Diverticulosis    DJD (degenerative joint disease)    Family history of adverse reaction to anesthesia    Gallstones    GERD (gastroesophageal reflux disease)    History of gout    History of kidney stones    Hyperlipidemia    Hypertension    IBS (irritable bowel syndrome)    Ischemic cardiomyopathy    a. 04/2016: EF 40-50% by cath, 50-55% +WMA by echo.   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 ([place]) ~ 2015   Morbid obesity ([place])    NSTEMI (non-ST elevated myocardial infarction) ([place]) 04/14/2016   Obstructive sleep apnea of adult    appt for cpap 8-8 to adjust settings no cpap at this time   On supplemental oxygen therapy    uses at bedtime or exposed to heat, 2Liters   Peripheral neuropathy    feet and toes   Peripheral vascular disease (HCC)    RLS (restless legs syndrome)    Spinal stenosis    Type II diabetes mellitus ([place])    Vitamin D deficiency    Walking pneumonia    Wears partial dentures    lower   Past Surgical History:  Procedure Laterality Date   APPENDECTOMY     BASAL CELL CARCINOMA EXCISION Left    leg   CARDIAC CATHETERIZATION N/A 04/15/2016   Procedure: Left Heart Cath and Coronary Angiography;  Surgeon: Belva Crome, MD;  Location: [place] [facility];  Service: Cardiovascular;  Laterality: N/A;   CARDIAC CATHETERIZATION N/A 04/15/2016   Procedure: Coronary Stent Intervention;  Surgeon: Belva Crome, MD;  Location: [place] [facility];  Service: Cardiovascular;  Laterality: N/A;  Mid LAD Mid CFX   CARPAL TUNNEL RELEASE Right    with trigger finger release   CATARACT EXTRACTION, BILATERAL Bilateral    DEBRIDEMENT TENNIS ELBOW Right    LUMBAR DISC SURGERY  X 2   MELANOMA EXCISION Left    "towards the back of my neck"   SHOULDER ARTHROSCOPY W/ ROTATOR CUFF REPAIR Left    SHOULDER OPEN ROTATOR CUFF REPAIR Right    TOTAL KNEE ARTHROPLASTY Bilateral    Social  History:  reports that she quit smoking about 36 years ago. Her smoking use included cigarettes. She has a 62.50 pack-year smoking history. She has never used smokeless tobacco. She reports that she does not drink alcohol and does not use drugs.   Allergies  Allergen Reactions   Brilinta [Ticagrelor] Shortness Of Breath and Other (See Comments)    Chest pain (also)   Aspirin Other (See Comments)    Can tolerate in small doses (is already on a blood thinner AND has kidney disease)   Nsaids Other (See Comments)    Patient is taking a blood thinner and has kidney disease   Minocycline Hcl Other (See Comments)    Welts   Oruvail [Ketoprofen] Other (See Comments)    Has kidney disease and is taking a blood thinner   Tricor [Fenofibrate]     Reaction unknown   Vasotec [Enalaprilat]     Reaction unknown   Zinc     Reaction unknown    Family History  Problem Relation Age of Onset   Heart disease Mother    Heart attack Mother    Kidney disease Father    AAA (abdominal aortic aneurysm) Father    Parkinson's disease Father    Cancer Brother        type unknown   Heart disease Son    Cancer Sister        female    Alzheimer's disease Sister    Colon cancer Neg Hx    Colon polyps Neg Hx    Esophageal cancer Neg Hx    Pancreatic cancer Neg Hx    Stomach cancer Neg Hx    Liver disease Neg Hx    Diabetes Neg Hx     Prior to Admission medications   Medication Sig Start Date End Date Taking? Authorizing Provider  acetaminophen (TYLENOL) 325 MG tablet Take 2 tablets (650 mg total) by mouth every 4 (four) hours as needed for headache or mild pain. 04/20/16  Yes Isaiah Serge, NP  albuterol (VENTOLIN HFA) 108 (90 Base) MCG/ACT inhaler Inhale 2 puffs into the lungs every 4 (four) hours as needed for wheezing or shortness of breath. 09/07/19  Yes Vicie Mutters, PA-C  aspirin EC 81 MG EC tablet Take 1 tablet (81 mg total) by mouth daily. 04/21/16  Yes Isaiah Serge, NP  atorvastatin (LIPITOR) 80 MG tablet TAKE 1 TABLET (80 MG TOTAL) BY MOUTH DAILY AT 6 PM. 11/23/19  Yes Skeet Latch, MD  Budeson-Glycopyrrol-Formoterol (BREZTRI AEROSPHERE) 160-9-4.8 MCG/ACT AERO Inhale 2 puffs into the lungs in the morning and at bedtime. 10/15/19  Yes Collene Gobble, MD  buPROPion (WELLBUTRIN XL) 300 MG 24 hr tablet Take 1 tablet (300 mg total) by  mouth daily. Take 1 tablet Daily for Mood Patient taking differently: Take 300 mg by mouth daily.  10/02/19  Yes Vicie Mutters, PA-C  Cholecalciferol ([employer] VIT D 5000 HIGH-POTENCY) 5000 units capsule Take 5,000 Units by mouth 2 (two) times daily.    Yes [provider]  Continuous Blood Gluc Receiver (FREESTYLE LIBRE 14 DAY READER) DEVI 1 Device by Does not apply route every 14 (fourteen) days. 09/07/19  Yes Vicie Mutters, PA-C  Continuous Blood Gluc Sensor (FREESTYLE LIBRE 14 DAY SENSOR) MISC Apply 1 application topically every 14 (fourteen) days. 09/07/19  Yes Vicie Mutters, PA-C  diclofenac sodium (VOLTAREN) 1 % GEL Apply 4 g topically 4 (four) times daily. Patient taking differently: Apply 4 g topically 4 (four) times daily as needed (pain).  01/18/18  Yes Vicie Mutters, PA-C  escitalopram (LEXAPRO) 20 MG tablet Take 1 tablet (20 mg total) by mouth daily. Patient taking differently: Take 40 mg by mouth daily.  07/02/19 07/01/20 Yes Vicie Mutters, PA-C  ferrous sulfate 325 (65 FE) MG tablet Take 325 mg by mouth 2 (two) times daily with a meal.    Yes [provider]  furosemide (LASIX) 40 MG tablet Take 40 mg by mouth daily as needed for fluid.    Yes [provider]  gabapentin (NEURONTIN) 100 MG capsule Take 1 to 2 capsules 3 to 4 x /day as needed for Painful Diabetic Neuropathy Patient taking differently: Take 200 mg by mouth 2 (two) times daily.  01/01/19  Yes [name], MD  HYDROcodone-acetaminophen (NORCO/VICODIN) 5-325 MG tablet Take 2 tablets by mouth every 4 (four) hours as needed.  12/05/19  Yes Garald Balding, PA-C  Insulin Glargine (BASAGLAR KWIKPEN) 100 UNIT/ML SOPN Inject 70 units Daily for Diabetes Patient taking differently: Inject 70 Units into the skin at bedtime. Inject 70 units Daily for Diabetes 04/30/19  Yes [name], MD  Insulin Pen Needle (BD PEN NEEDLE NANO U/F) 32G X 4 MM MISC USE AS DIRECTED 4 TIMES A DAY 02/22/19  Yes [name], MD  ketoconazole (NIZORAL) 2 % cream APPLY TO AFFECTED AREA TWICE A DAY Patient taking differently: Apply 1 application topically daily as needed (itching).  11/16/19  Yes [name], MD  Lancets [facility] ULTRASOFT) lancets Use as instructed 04/27/16  Yes Philemon Kingdom, MD  liraglutide (VICTOZA) 18 MG/3ML SOPN Inject 0.2 mLs (1.2 mg total) into the skin every morning. 07/30/19  Yes Vicie Mutters, PA-C  losartan (COZAAR) 50 MG tablet Take 1 tablet Daily for BP & Diabetic Kidney Protection Patient taking differently: Take 50 mg by mouth daily.  03/10/19  Yes [name], MD  metoprolol succinate (TOPROL-XL) 25 MG 24 hr tablet Take 1 tablet (25 mg total) by mouth at bedtime. 07/25/19  Yes Duke, Tami Lin, PA  NOVOLOG FLEXPEN 100 UNIT/ML FlexPen INJECT 50 TO 75 UNITS 3 X /DAY BEFORE MEALS AS DIRECTED FOR DIABETES Patient taking differently: Inject 20-25 Units into the skin 3 (three) times daily before meals. BS<150 Take 20 units BS>150 Take 25 units 11/16/19  Yes [name], MD  [facility] VERIO test strip USE AS INSTRUCTED TO CHECK SUGAR 2 TIMES DAILY. 09/21/17  Yes Philemon Kingdom, MD  pantoprazole (PROTONIX) 40 MG tablet Take 1 tablet (40 mg total) by mouth 2 (two) times daily before a meal. 10/15/19 10/14/20 Yes Vicie Mutters, PA-C  Polyethylene Glycol 400 (BLINK TEARS OP) Place 1 drop into both eyes daily as needed (dry eyes).    Yes [provider]  rOPINIRole (REQUIP)  3 MG tablet Take 1 tablet 4 x /day for Restless Legs Patient taking differently: Take 6 mg by mouth in the morning and at  bedtime.  03/25/19  Yes [name], MD  tamsulosin (FLOMAX) 0.4 MG CAPS capsule Take 1 capsule (0.4 mg total) by mouth daily. 12/05/19  Yes Garald Balding, PA-C  triamcinolone (NASACORT) 55 MCG/ACT AERO nasal inhaler PLACE 2 SPRAYS INTO THE NOSE AT BEDTIME. Patient taking differently: Place 2 sprays into the nose at bedtime as needed (allergies).  11/16/19 11/15/20 Yes [name], MD  vitamin B-12 (CYANOCOBALAMIN) 1000 MCG tablet Take 1,000 mcg by mouth daily.   Yes [provider]  nitroGLYCERIN (NITROSTAT) 0.4 MG SL tablet Place 1 tablet (0.4 mg total) under the tongue every 5 (five) minutes x 3 doses as needed for chest pain. 10/04/17   [name], MD  tiZANidine (ZANAFLEX) 4 MG tablet Take 1 tablet at Bedtime as needed for Muscle Spasms Patient taking differently: Take 4 mg by mouth at bedtime as needed for muscle spasms.  08/21/19   [name], MD   Physical Exam: Vitals:   12/25/19 1514  BP: (!) 106/58  Pulse: 77  Resp: (!) 25  Temp: 97.9 F (36.6 C)  TempSrc: Oral  SpO2: 92%     General exam: Moderately built and nourished patient, lying comfortably supine on the gurney in no obvious distress.  Head, eyes and ENT: Nontraumatic and normocephalic. Pupils equally reacting to light and accommodation. Oral mucosa moist.  Neck: Supple. No JVD, carotid bruit or thyromegaly.  Lymphatics: No lymphadenopathy.  Respiratory system: Moderate air movement with crackles bilaterally  Cardiovascular system: S1 and S2 heard, RRR. No JVD, murmurs, gallops, clicks or pedal edema.  Gastrointestinal system: Abdomen is nondistended, soft and nontender. Normal bowel sounds heard. .  Extremities: Symmetric 5 x 5 power. Peripheral pulses symmetrically felt.   Skin: No rashes or acute findings.  Musculoskeletal system: Negative exam.  Psychiatry: Pleasant and cooperative.   Labs on Admission:  Basic Metabolic Panel: No results for input(s): NA, K, CL, CO2,  GLUCOSE, BUN, CREATININE, CALCIUM, MG, PHOS in the last 168 hours. Liver Function Tests: No results for input(s): AST, ALT, ALKPHOS, BILITOT, PROT, ALBUMIN in the last 168 hours. No results for input(s): LIPASE, AMYLASE in the last 168 hours. No results for input(s): AMMONIA in the last 168 hours. CBC: No results for input(s): WBC, NEUTROABS, HGB, HCT, MCV, PLT in the last 168 hours. Cardiac Enzymes: No results for input(s): CKTOTAL, CKMB, CKMBINDEX, TROPONINI in the last 168 hours.  BNP (last 3 results) No results for input(s): PROBNP in the last 8760 hours. CBG: No results for input(s): GLUCAP in the last 168 hours.  Radiological Exams on Admission: DG CHEST PORT 1 VIEW  Result Date: 12/25/2019 CLINICAL DATA:  Shortness of breath. EXAM: PORTABLE CHEST 1 VIEW COMPARISON:  CT 08/28/2019.  Chest x-ray 07/24/2019. FINDINGS: Cardiomegaly. Diffuse bilateral pulmonary infiltrates/edema. Small bilateral pleural effusions. No pneumothorax. Thoracic spine scoliosis and degenerative change. IMPRESSION: 1.  Cardiomegaly. 2. Diffuse bilateral pulmonary infiltrates/edema. Small bilateral pleural effusions. Electronically Signed   By: Marcello Moores  Register   On: 12/25/2019 13:26    EKG: Independently reviewed. none  Assessment/Plan Acute respiratory failure with hypoxia due to acute pulmonary edema secondary to acute diastolic heart failure: Her volume status is hard to evaluate as she is morbidly obese, she does have a chest x-ray that shows pulmonary edema and bilateral pleural effusions compared from a CT scan of the abdomen pelvis on  12/06/2019 that showed no pleural effusions and no pulmonary edema dizziness most likely cause, she relates she has not been taking her Lasix regularly. We will place her on BiPAP we will start her on IV Lasix twice a day monitor electrolytes and replete as needed. We will keep her on a low-sodium diet restrict her fluids Daily weights strict I's and O's. We will need to  check a CBC, BMP and complete metabolic panel stat.  Hypertensive heart disease: Resume home medications, her blood pressure seems to be fairly controlled  Diverticulosis of colon without hemorrhage: She had a recent colonoscopy and endoscopy small hiatal hernia, colonoscopy showed multiple polyps with diverticulosis no signs of bleeding.  Gastritis and gastroduodenitis Started on Protonix p.o. twice daily.  COPD oxygen dependent: She is no wheezing physical exam continue inhalers we will continue to monitor.   DVT Prophylaxis: SCD Code Status: fULL  Family Communication: None  Disposition Plan: inpatient      It is my clinical opinion that admission to INPATIENT is reasonable and necessary in this 79 y.o. female past medical history of COPD oxygen dependent chronic diastolic heart failure who comes in for an endoscopy and colonoscopy was found to be hypoxic chest x-ray shows pulmonary edema cardiomegaly and bilateral pleural effusion she was started on IV Lasix place on BiPAP  Given the aforementioned, the predictability of an adverse outcome is felt to be significant. I expect that the patient will require at least 2 midnights in the hospital to treat this condition.  Charlynne Cousins MD [facility]   12/25/2019, 3:32 PM

## 2019-12-25 NOTE — Discharge Instructions (Signed)
YOU HAD AN ENDOSCOPIC PROCEDURE TODAY: Refer to the procedure report and other information in the discharge instructions given to you for any specific questions about what was found during the examination. If this information does not answer your questions, please call [place] office at [phone] to clarify.  ° °YOU SHOULD EXPECT: Some feelings of bloating in the abdomen. Passage of more gas than usual. Walking can help get rid of the air that was put into your GI tract during the procedure and reduce the bloating. If you had a lower endoscopy (such as a colonoscopy or flexible sigmoidoscopy) you may notice spotting of blood in your stool or on the toilet paper. Some abdominal soreness may be present for a day or two, also. ° °DIET: Your first meal following the procedure should be a light meal and then it is ok to progress to your normal diet. A half-sandwich or bowl of soup is an example of a good first meal. Heavy or fried foods are harder to digest and may make you feel nauseous or bloated. Drink plenty of fluids but you should avoid alcoholic beverages for 24 hours. If you had a esophageal dilation, please see attached instructions for diet.   ° °ACTIVITY: Your care partner should take you home directly after the procedure. You should plan to take it easy, moving slowly for the rest of the day. You can resume normal activity the day after the procedure however YOU SHOULD NOT DRIVE, use power tools, machinery or perform tasks that involve climbing or major physical exertion for 24 hours (because of the sedation medicines used during the test).  ° °SYMPTOMS TO REPORT IMMEDIATELY: °A gastroenterologist can be reached at any hour. Please call [phone]  for any of the following symptoms:  °Following lower endoscopy (colonoscopy, flexible sigmoidoscopy) °Excessive amounts of blood in the stool  °Significant tenderness, worsening of abdominal pains  °Swelling of the abdomen that is new, acute  °Fever of 100° or  higher  °Following upper endoscopy (EGD, EUS, ERCP, esophageal dilation) °Vomiting of blood or coffee ground material  °New, significant abdominal pain  °New, significant chest pain or pain under the shoulder blades  °Painful or persistently difficult swallowing  °New shortness of breath  °Black, tarry-looking or red, bloody stools ° °FOLLOW UP:  °If any biopsies were taken you will be contacted by phone or by letter within the next 1-3 weeks. Call [phone]  if you have not heard about the biopsies in 3 weeks.  °Please also call with any specific questions about appointments or follow up tests. ° °

## 2019-12-25 NOTE — ED Triage Notes (Signed)
Pt brought by Endo. Came in for upper and lower endo today which was completed including biopsies. While in recovery Pt Sp02 dropped into 70s, Pt placed on nonrebreather with limited improvement Sp02 into 80s, Pt then placed on BiPap per provider and transferred to ED.

## 2019-12-25 NOTE — Anesthesia Preprocedure Evaluation (Signed)
Anesthesia Evaluation  Patient identified by MRN, date of birth, ID band Patient awake    Reviewed: Allergy & Precautions, NPO status , Patient's Chart, lab work & pertinent test results  Airway Mallampati: II  TM Distance: >3 FB Neck ROM: Full    Dental   Pulmonary sleep apnea , COPD,  oxygen dependent, former smoker,    breath sounds clear to auscultation       Cardiovascular hypertension, Pt. on medications and Pt. on home beta blockers + CAD, + Past MI, + Cardiac Stents and + Peripheral Vascular Disease   Rhythm:Regular Rate:Normal     Neuro/Psych  Neuromuscular disease    GI/Hepatic Neg liver ROS, GERD  ,  Endo/Other  diabetesMorbid obesity  Renal/GU Renal disease     Musculoskeletal  (+) Arthritis ,   Abdominal   Peds  Hematology  (+) anemia ,   Anesthesia Other Findings   Reproductive/Obstetrics                             Anesthesia Physical Anesthesia Plan  ASA: III  Anesthesia Plan: MAC   Post-op Pain Management:    Induction:   PONV Risk Score and Plan: 2 and Propofol infusion and Ondansetron  Airway Management Planned: Natural Airway and Nasal Cannula  Additional Equipment:   Intra-op Plan:   Post-operative Plan:   Informed Consent: I have reviewed the patients History and Physical, chart, labs and discussed the procedure including the risks, benefits and alternatives for the proposed anesthesia with the patient or authorized representative who has indicated his/her understanding and acceptance.       Plan Discussed with: CRNA  Anesthesia Plan Comments:         Anesthesia Quick Evaluation

## 2019-12-25 NOTE — Op Note (Signed)
[facility] Patient Name: Janet Choi Procedure Date: 12/25/2019 MRN: [id number] Attending MD: Gerrit Heck , MD Date of Birth: 05/30/1940 CSN: [id number] Age: 79 Admit Type: Outpatient Procedure:                Upper GI endoscopy Indications:              Iron deficiency anemia, Diarrhea Providers:                Gerrit Heck, MD, Carlyn Reichert, RN, Fransico Setters                            [place], Technician Referring MD:              Medicines:                Monitored Anesthesia Care Complications:            No immediate complications. Estimated Blood Loss:     Estimated blood loss was minimal. Procedure:                Pre-Anesthesia Assessment:                           - Prior to the procedure, a History and Physical                            was performed, and patient medications and                            allergies were reviewed. The patient's tolerance of                            previous anesthesia was also reviewed. The risks                            and benefits of the procedure and the sedation                            options and risks were discussed with the patient.                            All questions were answered, and informed consent                            was obtained. Prior Anticoagulants: The patient has                            taken no previous anticoagulant or antiplatelet                            agents except for aspirin. ASA Grade Assessment:                            III - A patient with severe systemic disease. After  reviewing the risks and benefits, the patient was                            deemed in satisfactory condition to undergo the                            procedure.                           After obtaining informed consent, the endoscope was                            passed under direct vision. Throughout the                            procedure, the patient's blood  pressure, pulse, and                            oxygen saturations were monitored continuously. The                            GIF-H190 ([id number]) Olympus gastroscope was                            introduced through the mouth, and advanced to the                            second part of duodenum. The upper GI endoscopy was                            accomplished without difficulty. The patient                            tolerated the procedure well. Scope In: Scope Out: Findings:      A 1 cm hiatal hernia was present.      The exam of the esophagus was otherwise normal.      Scattered mild inflammation characterized by erythema was found in the       gastric body, at the incisura and in the gastric antrum. Biopsies were       taken with a cold forceps for histology. Estimated blood loss was       minimal.      The duodenal bulb and second portion of the duodenum were normal.       Biopsies for histology were taken with a cold forceps for evaluation of       celiac disease. Estimated blood loss was minimal. The dudoenal sweep was       tight, and difficult to fully visualize entire mucosal surface through       the acute angulation. Impression:               - 1 cm hiatal hernia.                           - Gastritis. Biopsied.                           -  Normal duodenal bulb and second portion of the                            duodenum. Biopsied. Moderate Sedation:      Not Applicable - Patient had care per Anesthesia. Recommendation:           - Patient has a contact number available for                            emergencies. The signs and symptoms of potential                            delayed complications were discussed with the                            patient. Return to normal activities tomorrow.                            Written discharge instructions were provided to the                            patient.                           - Resume previous diet.                            - Await pathology results.                           - Perform a colonoscopy today.                           - Recent CT abd/pelvis otherwise unremarkable in                            the proximal GI tract.                           - Additional recommendation pending colonoscopy                            findings. Procedure Code(s):        --- Professional ---                           [phone], Esophagogastroduodenoscopy, flexible,                            transoral; with biopsy, single or multiple Diagnosis Code(s):        --- Professional ---                           K44.9, Diaphragmatic hernia without obstruction or                            gangrene  K29.70, Gastritis, unspecified, without bleeding                           D50.9, Iron deficiency anemia, unspecified                           R19.7, Diarrhea, unspecified CPT copyright 2019 American Medical Association. All rights reserved. The codes documented in this report are preliminary and upon coder review may  be revised to meet current compliance requirements. Gerrit Heck, MD 12/25/2019 12:47:01 PM Number of Addenda: 0

## 2019-12-25 NOTE — H&P (Signed)
History and Physical  Janet Choi [id number]:[id number] DOB: June 15, 1940 DOA: 12/25/2019  PCP: [name], MD Patient coming from: endo suite  I have personally briefly reviewed patient's old medical records in [place]   Chief Complaint: hypoxia  HPI: Janet Choi is a 79 y.o. 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 who was getting a colonoscopy and endoscopy for pancytopenia at the [facility] long endoscopy suite when she started getting hypoxic, was placed on a BiPAP and her saturations improved, chest x-ray was done that shows pleural effusions cardiomegaly and bilateral interstitial infiltrates, she had a CT scan on 12/06/2019 that showed no effusions and no pulmonary edema so we are consulted for further evaluation and admission, I have asked them to transfer the patient to the emergency room as we do not have any beds and we have multiple patients waiting at [facility] [place] long ED and she needs a closer care, will continue her on BiPAP.   Review of Systems: All systems reviewed and apart from history of presenting illness, are negative.  Past Medical History:  Diagnosis Date  . Anemia    hx (04/14/2016)  . Anxiety   . Arthritis    "severe in my back; hands; ankles" (04/14/2016)  . Atypical chest pain 01/24/2018   Midline pain absent supine "constant" daytime since 12/31/17 > resolved as of 02/20/2018 on gerd/ gas diet   . Basal cell carcinoma    "several burned off; one cut off"  . CAD in native artery    a. NSTEMI 04/2016 - s/p DES toLAD and LCx. PCI to LCx notable for microembolization during cath.  . Chest pain- reslved with stopping Brilinta now on Plaix 04/16/2016  . Chronic lower back pain   . CKD (chronic kidney disease), stage III    stage 3  . Complication of anesthesia   . COPD (chronic  obstructive pulmonary disease) ([place])   . Depression   . Diverticulosis   . DJD (degenerative joint disease)   . Family history of adverse reaction to anesthesia   . Gallstones   . GERD (gastroesophageal reflux disease)   . History of gout   . History of kidney stones   . Hyperlipidemia   . Hypertension   . IBS (irritable bowel syndrome)   . Ischemic cardiomyopathy    a. 04/2016: EF 40-50% by cath, 50-55% +WMA by echo.  . 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 ([facility]) ~ 2015  . Morbid obesity ([place])   . NSTEMI (non-ST elevated myocardial infarction) ([place]) 04/14/2016  . Obstructive sleep apnea of adult    appt for cpap 8-8 to adjust settings no cpap at this time  . On supplemental oxygen therapy    uses at bedtime or exposed to heat, 2Liters  . Peripheral neuropathy    feet and toes  . Peripheral vascular disease ([place])   . RLS (restless legs syndrome)   . Spinal stenosis   . Type II diabetes mellitus ([place])   . Vitamin D deficiency   . Walking pneumonia   . Wears partial dentures    lower   Past Surgical History:  Procedure Laterality Date  . APPENDECTOMY    . BASAL CELL CARCINOMA EXCISION Left    leg  . CARDIAC CATHETERIZATION N/A 04/15/2016   Procedure: Left Heart Cath and Coronary Angiography;  Surgeon: Belva Crome, MD;  Location: [place] [facility];  Service: Cardiovascular;  Laterality: N/A;  . CARDIAC CATHETERIZATION N/A 04/15/2016   Procedure: Coronary Stent Intervention;  Surgeon: Belva Crome, MD;  Location: [place] [facility];  Service: Cardiovascular;  Laterality: N/A;  Mid LAD Mid CFX  . CARPAL TUNNEL RELEASE Right    with trigger finger release  . CATARACT EXTRACTION, BILATERAL Bilateral   . DEBRIDEMENT TENNIS ELBOW Right   . LUMBAR DISC SURGERY  X 2  . MELANOMA EXCISION Left    "towards the back of my neck"  . SHOULDER ARTHROSCOPY W/ ROTATOR CUFF REPAIR Left   . SHOULDER OPEN ROTATOR CUFF REPAIR Right   . TOTAL KNEE ARTHROPLASTY Bilateral    Social  History:  reports that she quit smoking about 36 years ago. Her smoking use included cigarettes. She has a 62.50 pack-year smoking history. She has never used smokeless tobacco. She reports that she does not drink alcohol and does not use drugs.   Allergies  Allergen Reactions  . Brilinta [Ticagrelor] Shortness Of Breath and Other (See Comments)    Chest pain (also)  . Aspirin Other (See Comments)    Can tolerate in small doses (is already on a blood thinner AND has kidney disease)  . Nsaids Other (See Comments)    Patient is taking a blood thinner and has kidney disease  . Minocycline Hcl Other (See Comments)    Welts  . Oruvail [Ketoprofen] Other (See Comments)    Has kidney disease and is taking a blood thinner  . Tricor [Fenofibrate]     Reaction unknown  . Vasotec [Enalaprilat]     Reaction unknown  . Zinc     Reaction unknown    Family History  Problem Relation Age of Onset  . Heart disease Mother   . Heart attack Mother   . Kidney disease Father   . AAA (abdominal aortic aneurysm) Father   . Parkinson's disease Father   . Cancer Brother        type unknown  . Heart disease Son   . Cancer Sister        female   . Alzheimer's disease Sister   . Colon cancer Neg Hx   . Colon polyps Neg Hx   . Esophageal cancer Neg Hx   . Pancreatic cancer Neg Hx   . Stomach cancer Neg Hx   . Liver disease Neg Hx   . Diabetes Neg Hx     Prior to Admission medications   Medication Sig Start Date End Date Taking? Authorizing Provider  acetaminophen (TYLENOL) 325 MG tablet Take 2 tablets (650 mg total) by mouth every 4 (four) hours as needed for headache or mild pain. 04/20/16  Yes Isaiah Serge, NP  albuterol (VENTOLIN HFA) 108 (90 Base) MCG/ACT inhaler Inhale 2 puffs into the lungs every 4 (four) hours as needed for wheezing or shortness of breath. 09/07/19  Yes Vicie Mutters, PA-C  aspirin EC 81 MG EC tablet Take 1 tablet (81 mg total) by mouth daily. 04/21/16  Yes Isaiah Serge, NP  atorvastatin (LIPITOR) 80 MG tablet TAKE 1 TABLET (80 MG TOTAL) BY MOUTH DAILY AT 6 PM. 11/23/19  Yes Skeet Latch, MD  Budeson-Glycopyrrol-Formoterol (BREZTRI AEROSPHERE) 160-9-4.8 MCG/ACT AERO Inhale 2 puffs into the lungs in the morning and at bedtime. 10/15/19  Yes Collene Gobble, MD  buPROPion (WELLBUTRIN XL) 300 MG 24 hr tablet Take 1 tablet (300 mg total) by  mouth daily. Take 1 tablet Daily for Mood Patient taking differently: Take 300 mg by mouth daily.  10/02/19  Yes Vicie Mutters, PA-C  Cholecalciferol ([employer] VIT D 5000 HIGH-POTENCY) 5000 units capsule Take 5,000 Units by mouth 2 (two) times daily.    Yes [provider]  Continuous Blood Gluc Receiver (FREESTYLE LIBRE 14 DAY READER) DEVI 1 Device by Does not apply route every 14 (fourteen) days. 09/07/19  Yes Vicie Mutters, PA-C  Continuous Blood Gluc Sensor (FREESTYLE LIBRE 14 DAY SENSOR) MISC Apply 1 application topically every 14 (fourteen) days. 09/07/19  Yes Vicie Mutters, PA-C  diclofenac sodium (VOLTAREN) 1 % GEL Apply 4 g topically 4 (four) times daily. Patient taking differently: Apply 4 g topically 4 (four) times daily as needed (pain).  01/18/18  Yes Vicie Mutters, PA-C  escitalopram (LEXAPRO) 20 MG tablet Take 1 tablet (20 mg total) by mouth daily. Patient taking differently: Take 40 mg by mouth daily.  07/02/19 07/01/20 Yes Vicie Mutters, PA-C  ferrous sulfate 325 (65 FE) MG tablet Take 325 mg by mouth 2 (two) times daily with a meal.    Yes [provider]  furosemide (LASIX) 40 MG tablet Take 40 mg by mouth daily as needed for fluid.    Yes [provider]  gabapentin (NEURONTIN) 100 MG capsule Take 1 to 2 capsules 3 to 4 x /day as needed for Painful Diabetic Neuropathy Patient taking differently: Take 200 mg by mouth 2 (two) times daily.  01/01/19  Yes [name], MD  HYDROcodone-acetaminophen (NORCO/VICODIN) 5-325 MG tablet Take 2 tablets by mouth every 4 (four) hours as needed.  12/05/19  Yes Garald Balding, PA-C  Insulin Glargine (BASAGLAR KWIKPEN) 100 UNIT/ML SOPN Inject 70 units Daily for Diabetes Patient taking differently: Inject 70 Units into the skin at bedtime. Inject 70 units Daily for Diabetes 04/30/19  Yes [name], MD  Insulin Pen Needle (BD PEN NEEDLE NANO U/F) 32G X 4 MM MISC USE AS DIRECTED 4 TIMES A DAY 02/22/19  Yes [name], MD  ketoconazole (NIZORAL) 2 % cream APPLY TO AFFECTED AREA TWICE A DAY Patient taking differently: Apply 1 application topically daily as needed (itching).  11/16/19  Yes [name], MD  Lancets [facility] ULTRASOFT) lancets Use as instructed 04/27/16  Yes Philemon Kingdom, MD  liraglutide (VICTOZA) 18 MG/3ML SOPN Inject 0.2 mLs (1.2 mg total) into the skin every morning. 07/30/19  Yes Vicie Mutters, PA-C  losartan (COZAAR) 50 MG tablet Take 1 tablet Daily for BP & Diabetic Kidney Protection Patient taking differently: Take 50 mg by mouth daily.  03/10/19  Yes [name], MD  metoprolol succinate (TOPROL-XL) 25 MG 24 hr tablet Take 1 tablet (25 mg total) by mouth at bedtime. 07/25/19  Yes Duke, Tami Lin, PA  NOVOLOG FLEXPEN 100 UNIT/ML FlexPen INJECT 50 TO 75 UNITS 3 X /DAY BEFORE MEALS AS DIRECTED FOR DIABETES Patient taking differently: Inject 20-25 Units into the skin 3 (three) times daily before meals. BS<150 Take 20 units BS>150 Take 25 units 11/16/19  Yes [name], MD  [facility] VERIO test strip USE AS INSTRUCTED TO CHECK SUGAR 2 TIMES DAILY. 09/21/17  Yes Philemon Kingdom, MD  pantoprazole (PROTONIX) 40 MG tablet Take 1 tablet (40 mg total) by mouth 2 (two) times daily before a meal. 10/15/19 10/14/20 Yes Vicie Mutters, PA-C  Polyethylene Glycol 400 (BLINK TEARS OP) Place 1 drop into both eyes daily as needed (dry eyes).    Yes [provider]  rOPINIRole (REQUIP)  3 MG tablet Take 1 tablet 4 x /day for Restless Legs Patient taking differently: Take 6 mg by mouth in the morning and at  bedtime.  03/25/19  Yes [name], MD  tamsulosin (FLOMAX) 0.4 MG CAPS capsule Take 1 capsule (0.4 mg total) by mouth daily. 12/05/19  Yes Garald Balding, PA-C  triamcinolone (NASACORT) 55 MCG/ACT AERO nasal inhaler PLACE 2 SPRAYS INTO THE NOSE AT BEDTIME. Patient taking differently: Place 2 sprays into the nose at bedtime as needed (allergies).  11/16/19 11/15/20 Yes [name], MD  vitamin B-12 (CYANOCOBALAMIN) 1000 MCG tablet Take 1,000 mcg by mouth daily.   Yes [provider]  nitroGLYCERIN (NITROSTAT) 0.4 MG SL tablet Place 1 tablet (0.4 mg total) under the tongue every 5 (five) minutes x 3 doses as needed for chest pain. 10/04/17   [name], MD  tiZANidine (ZANAFLEX) 4 MG tablet Take 1 tablet at Bedtime as needed for Muscle Spasms Patient taking differently: Take 4 mg by mouth at bedtime as needed for muscle spasms.  08/21/19   [name], MD   Physical Exam: Vitals:   12/25/19 1320 12/25/19 1330 12/25/19 1340 12/25/19 1349  BP: 100/60  101/61   Pulse: 82 76 74 78  Resp: (!) 22 (!) 22 20 (!) 26  Temp:      TempSrc:      SpO2: (!) 79% 93% 96% 90%  Weight:      Height:         General exam: Moderately built and nourished patient, lying comfortably supine on the gurney in no obvious distress.  Head, eyes and ENT: Nontraumatic and normocephalic. Pupils equally reacting to light and accommodation. Oral mucosa moist.  Neck: Supple. No JVD, carotid bruit or thyromegaly.  Lymphatics: No lymphadenopathy.  Respiratory system: Moderate air movement with crackles bilaterally  Cardiovascular system: S1 and S2 heard, RRR. No JVD, murmurs, gallops, clicks or pedal edema.  Gastrointestinal system: Abdomen is nondistended, soft and nontender. Normal bowel sounds heard. .  Extremities: Symmetric 5 x 5 power. Peripheral pulses symmetrically felt.   Skin: No rashes or acute findings.  Musculoskeletal system: Negative exam.  Psychiatry: Pleasant and  cooperative.   Labs on Admission:  Basic Metabolic Panel: No results for input(s): NA, K, CL, CO2, GLUCOSE, BUN, CREATININE, CALCIUM, MG, PHOS in the last 168 hours. Liver Function Tests: No results for input(s): AST, ALT, ALKPHOS, BILITOT, PROT, ALBUMIN in the last 168 hours. No results for input(s): LIPASE, AMYLASE in the last 168 hours. No results for input(s): AMMONIA in the last 168 hours. CBC: No results for input(s): WBC, NEUTROABS, HGB, HCT, MCV, PLT in the last 168 hours. Cardiac Enzymes: No results for input(s): CKTOTAL, CKMB, CKMBINDEX, TROPONINI in the last 168 hours.  BNP (last 3 results) No results for input(s): PROBNP in the last 8760 hours. CBG: No results for input(s): GLUCAP in the last 168 hours.  Radiological Exams on Admission: DG CHEST PORT 1 VIEW  Result Date: 12/25/2019 CLINICAL DATA:  Shortness of breath. EXAM: PORTABLE CHEST 1 VIEW COMPARISON:  CT 08/28/2019.  Chest x-ray 07/24/2019. FINDINGS: Cardiomegaly. Diffuse bilateral pulmonary infiltrates/edema. Small bilateral pleural effusions. No pneumothorax. Thoracic spine scoliosis and degenerative change. IMPRESSION: 1.  Cardiomegaly. 2. Diffuse bilateral pulmonary infiltrates/edema. Small bilateral pleural effusions. Electronically Signed   By: Marcello Moores  Register   On: 12/25/2019 13:26    EKG: Independently reviewed. none  Assessment/Plan Acute respiratory failure with hypoxia due to acute pulmonary edema secondary to acute diastolic heart failure: Her  volume status is hard to evaluate as she is morbidly obese, she does have a chest x-ray that shows pulmonary edema and bilateral pleural effusions compared from a CT scan of the abdomen pelvis on 12/06/2019 that showed no pleural effusions and no pulmonary edema dizziness most likely cause, she relates she has not been taking her Lasix regularly. We will place her on BiPAP we will start her on IV Lasix twice a day monitor electrolytes and replete as needed. We will  keep her on a low-sodium diet restrict her fluids Daily weights strict I's and O's. We will need to check a CBC, BMP and complete metabolic panel stat.  Hypertensive heart disease: Resume home medications, her blood pressure seems to be fairly controlled  Diverticulosis of colon without hemorrhage: She had a recent colonoscopy and endoscopy small hiatal hernia, colonoscopy showed multiple polyps with diverticulosis no signs of bleeding.  Gastritis and gastroduodenitis Started on Protonix p.o. twice daily.  COPD oxygen dependent: She is no wheezing physical exam continue inhalers we will continue to monitor.   DVT Prophylaxis: SCD Code Status: fULL  Family Communication: None  Disposition Plan: inpatient      It is my clinical opinion that admission to INPATIENT is reasonable and necessary in this 79 y.o. female past medical history of COPD oxygen dependent chronic diastolic heart failure who comes in for an endoscopy and colonoscopy was found to be hypoxic chest x-ray shows pulmonary edema cardiomegaly and bilateral pleural effusion she was started on IV Lasix place on BiPAP  Given the aforementioned, the predictability of an adverse outcome is felt to be significant. I expect that the patient will require at least 2 midnights in the hospital to treat this condition.  Charlynne Cousins MD [facility]   12/25/2019, 1:54 PM

## 2019-12-25 NOTE — Anesthesia Procedure Notes (Signed)
Procedure Name: MAC Date/Time: 12/25/2019 11:43 AM Performed by: West Pugh, CRNA Pre-anesthesia Checklist: Patient identified, Emergency Drugs available, Suction available, Patient being monitored and Timeout performed Patient Re-evaluated:Patient Re-evaluated prior to induction Oxygen Delivery Method: Simple face mask Induction Type: IV induction Placement Confirmation: positive ETCO2 Dental Injury: Teeth and Oropharynx as per pre-operative assessment

## 2019-12-25 NOTE — Interval H&P Note (Signed)
History and Physical Interval Note:  12/25/2019 11:35 AM  Janet Choi  has presented today for surgery, with the diagnosis of IDA, chronic diarrhea.  The various methods of treatment have been discussed with the patient and family. After consideration of risks, benefits and other options for treatment, the patient has consented to  Procedure(s): COLONOSCOPY WITH PROPOFOL (N/A) ESOPHAGOGASTRODUODENOSCOPY (EGD) WITH PROPOFOL (N/A) as a surgical intervention.  The patient's history has been reviewed, patient examined, no change in status, stable for surgery.  I have reviewed the patient's chart and labs.  Questions were answered to the patient's satisfaction.     Dominic Pea Lyndi Holbein

## 2019-12-26 ENCOUNTER — Encounter (HOSPITAL_COMMUNITY): Payer: Self-pay | Admitting: Gastroenterology

## 2019-12-26 ENCOUNTER — Other Ambulatory Visit: Payer: Self-pay

## 2019-12-26 ENCOUNTER — Inpatient Hospital Stay (HOSPITAL_COMMUNITY): Payer: PPO

## 2019-12-26 DIAGNOSIS — K2970 Gastritis, unspecified, without bleeding: Secondary | ICD-10-CM

## 2019-12-26 DIAGNOSIS — I5031 Acute diastolic (congestive) heart failure: Secondary | ICD-10-CM

## 2019-12-26 DIAGNOSIS — E785 Hyperlipidemia, unspecified: Secondary | ICD-10-CM

## 2019-12-26 DIAGNOSIS — N179 Acute kidney failure, unspecified: Secondary | ICD-10-CM

## 2019-12-26 DIAGNOSIS — E1169 Type 2 diabetes mellitus with other specified complication: Secondary | ICD-10-CM

## 2019-12-26 DIAGNOSIS — N1832 Chronic kidney disease, stage 3b: Secondary | ICD-10-CM

## 2019-12-26 DIAGNOSIS — G4733 Obstructive sleep apnea (adult) (pediatric): Secondary | ICD-10-CM

## 2019-12-26 DIAGNOSIS — I9589 Other hypotension: Secondary | ICD-10-CM | POA: Diagnosis present

## 2019-12-26 DIAGNOSIS — J9621 Acute and chronic respiratory failure with hypoxia: Secondary | ICD-10-CM

## 2019-12-26 DIAGNOSIS — K2990 Gastroduodenitis, unspecified, without bleeding: Secondary | ICD-10-CM

## 2019-12-26 DIAGNOSIS — E861 Hypovolemia: Secondary | ICD-10-CM

## 2019-12-26 DIAGNOSIS — J449 Chronic obstructive pulmonary disease, unspecified: Secondary | ICD-10-CM

## 2019-12-26 LAB — BASIC METABOLIC PANEL
Anion gap: 12 (ref 5–15)
BUN: 26 mg/dL — ABNORMAL HIGH (ref 8–23)
CO2: 28 mmol/L (ref 22–32)
Calcium: 8.8 mg/dL — ABNORMAL LOW (ref 8.9–10.3)
Chloride: 100 mmol/L (ref 98–111)
Creatinine, Ser: 1.96 mg/dL — ABNORMAL HIGH (ref 0.44–1.00)
GFR calc Af Amer: 28 mL/min — ABNORMAL LOW (ref >60)
GFR calc non Af Amer: 24 mL/min — ABNORMAL LOW (ref >60)
Glucose, Bld: 244 mg/dL — ABNORMAL HIGH (ref 70–99)
Potassium: 4 mmol/L (ref 3.5–5.1)
Sodium: 140 mmol/L (ref 135–145)

## 2019-12-26 LAB — GLUCOSE, CAPILLARY
Glucose-Capillary: 192 mg/dL — ABNORMAL HIGH (ref 70–99)
Glucose-Capillary: 252 mg/dL — ABNORMAL HIGH (ref 70–99)
Glucose-Capillary: 229 mg/dL — ABNORMAL HIGH (ref 70–99)
Glucose-Capillary: 216 mg/dL — ABNORMAL HIGH (ref 70–99)
Glucose-Capillary: 161 mg/dL — ABNORMAL HIGH (ref 70–99)

## 2019-12-26 LAB — SARS CORONAVIRUS 2 BY RT PCR (HOSPITAL ORDER, PERFORMED IN ~~LOC~~ HOSPITAL LAB): SARS Coronavirus 2: NEGATIVE

## 2019-12-26 LAB — CBC
HCT: 28.5 % — ABNORMAL LOW (ref 36.0–46.0)
Hemoglobin: 8.9 g/dL — ABNORMAL LOW (ref 12.0–15.0)
MCH: 33.7 pg (ref 26.0–34.0)
MCHC: 31.2 g/dL (ref 30.0–36.0)
MCV: 108 fL — ABNORMAL HIGH (ref 80.0–100.0)
Platelets: 113 10*3/uL — ABNORMAL LOW (ref 150–400)
RBC: 2.64 MIL/uL — ABNORMAL LOW (ref 3.87–5.11)
RDW: 15.9 % — ABNORMAL HIGH (ref 11.5–15.5)
WBC: 2.7 10*3/uL — ABNORMAL LOW (ref 4.0–10.5)
nRBC: 0 % (ref 0.0–0.2)

## 2019-12-26 LAB — SURGICAL PATHOLOGY

## 2019-12-26 MED ORDER — FUROSEMIDE 40 MG PO TABS: 40.0000 mg | ORAL_TABLET | Freq: Two times a day (BID) | ORAL | Status: DC

## 2019-12-26 MED ORDER — SODIUM CHLORIDE 0.9 % IV BOLUS: 250.0000 mL | Freq: Once | INTRAVENOUS | Status: DC

## 2019-12-26 MED ORDER — SODIUM CHLORIDE 0.9 % IV BOLUS
500.0000 mL | Freq: Once | INTRAVENOUS | Status: AC
  Administered 2019-12-26: 500 mL via INTRAVENOUS

## 2019-12-26 MED ORDER — UMECLIDINIUM BROMIDE 62.5 MCG/INH IN AEPB
1.0000 | INHALATION_SPRAY | Freq: Every day | RESPIRATORY_TRACT | Status: DC
  Administered 2019-12-27 – 2019-12-29 (×3): 1 via RESPIRATORY_TRACT
  Filled 2019-12-26: qty 7

## 2019-12-26 MED ORDER — MOMETASONE FURO-FORMOTEROL FUM 100-5 MCG/ACT IN AERO
2.0000 | INHALATION_SPRAY | Freq: Two times a day (BID) | RESPIRATORY_TRACT | Status: DC
  Administered 2019-12-26 – 2019-12-29 (×5): 2 via RESPIRATORY_TRACT
  Filled 2019-12-26: qty 8.8

## 2019-12-26 NOTE — Telephone Encounter (Signed)
I have sent a message to Kathlee Nations about this message

## 2019-12-26 NOTE — Progress Notes (Signed)
Resumed care of this pt at 2300. I agree with previous RN's assessment. Pt resting at this time. BiPAP on.

## 2019-12-26 NOTE — Telephone Encounter (Signed)
Left message for pt to call back need to find out what the bill date is so I can work on this

## 2019-12-26 NOTE — Progress Notes (Addendum)
   12/26/19 1010  Vitals  BP (!) 80/50  BP Location Right Arm  BP Method Manual  Pulse Rate 78  ECG Heart Rate 82  Resp (!) 23  Oxygen Therapy  SpO2 93 %  O2 Device Nasal Cannula  O2 Flow Rate (L/min) 5 L/min  Dr. Grandville Silos notified via text page - Lasix & Cozaar not given - new orders.  1014 - recheck of manual BP  by Gerri Spore, RN [phone].  Dr. Grandville Silos notified - see new orders

## 2019-12-26 NOTE — Progress Notes (Signed)
PT Cancellation Note  Patient Details Name: Janet Choi MRN: [id number] DOB: 1940-08-04   Cancelled Treatment:    Reason Eval/Treat Not Completed: Patient not medically ready;Medical issues which prohibited therapy (Patient hypotensive with BP at 82/48 mmHg and manually rechecked and found to be 64/58 mmHg. Will hold and follow up at later time/date when pt able and as schedule allows.)   Gwynneth Albright PT, DPT Acute Rehabilitation Services  Office [phone] Pager [phone]  12/26/2019 11:23 AM

## 2019-12-26 NOTE — Progress Notes (Signed)
OT Cancellation Note  Patient Details Name: Janet Choi MRN: [id number] DOB: [birth year]   Cancelled Treatment:    Reason Eval/Treat Not Completed: Patient not medically ready Will hold therapy evaluation secondary to hypotension. Will f/u with patient as able and when patient hemodynamically stable.  Skyelyn Scruggs L Khylon Davies 12/26/2019, 12:32 PM

## 2019-12-26 NOTE — Progress Notes (Signed)
PROGRESS NOTE    Janet Choi  TDV:[id number] DOB: [birth year] DOA: 12/25/2019 PCP: [name], MD    Chief Complaint  Patient presents with  . Shortness of Breath    Brief Narrative:  HPI per Dr. Aileen Fass Janet Choi is a 79 y.o. 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 who was getting a colonoscopy and endoscopy for pancytopenia at the [facility] long endoscopy suite when she started getting hypoxic, was placed on a BiPAP and her saturations improved, chest x-ray was done that shows pleural effusions cardiomegaly and bilateral interstitial infiltrates, she had a CT scan on 12/06/2019 that showed no effusions and no pulmonary edema so we are consulted for further evaluation and admission, I have asked them to transfer the patient to the emergency room as we do not have any beds and we have multiple patients waiting at [facility] [place] long ED and she needs a closer care, will continue her on BiPAP.   Assessment & Plan:   Principal Problem:   Acute on chronic respiratory failure with hypoxia (HCC) Active Problems:   Hypotension due to hypovolemia   Hyperlipidemia associated with type 2 diabetes mellitus ([place])   Essential hypertension   Morbid obesity due to excess calories (HCC)   RLS (restless legs syndrome)   Type 2 diabetes mellitus with hyperlipidemia (HCC)   OSA and COPD overlap syndrome (HCC)   Acute on chronic diastolic CHF (congestive heart failure) (HCC)   Gastritis and gastroduodenitis   Acute diastolic heart failure (HCC)   AKI (acute kidney injury) ([place])   Obesity, Class III, BMI 40-49.9 (morbid obesity) ([place])  1 acute respiratory failure with hypoxia felt secondary to acute on chronic diastolic CHF exacerbation Patient noted to be hypoxic requiring BiPAP while getting colonoscopy endoscopy.  Patient with history of COPD oxygen dependent chronically on 3 L nasal cannula on ambulation. Volume status difficult to evaluate due to patient's morbid obesity. Chest x-ray done on admission concerning for pulmonary edema with bilateral pleural effusions compared with CT abdomen and pelvis of 12/06/2019. Patient noted to be on Lasix as needed prior to admission. Patient stated initially was on scheduled Lasix however due to severe cramping was weaned down and currently on Lasix as needed. Clinical improvement. Off BiPAP currently on 5 L nasal cannula. Patient on IV Lasix 80 mg twice daily urine output recorded of 700 cc over the past 24 hours. Patient noted with a bump in creatinine. Patient also noted to be hypotensive this morning with blood pressure of 80/50. Discontinue diuretics and Cozaar for now. Placed on CPAP nightly. Repeat chest x-ray. Follow.  2. Hypotension Likely secondary to hypovolemia secondary to aggressive diuresis over the past 24 hours. Patient currently asymptomatic. Blood pressure done manually this morning of 80/50. Give a 500 cc normal saline bolus. DC diuretics for now. Follow.  3. Hypertension DC IV Lasix and Cozaar. Blood pressure noted to be hypotensive. Monitor closely with fluid bolus. Follow.  4. Gastritis and gastroduodenitis Per EGD. Continue PPI twice daily.  5. COPD oxygen dependent/OSA Patient noted to be on 3 L nasal cannula home O2 on ambulation. Patient currently on 5 L nasal cannula required BiPAP overnight secondary to problem #1. No significant wheezing noted. Fair air movement. Wean O2 to keep sats between 88 to 93%. If no significant improvement with hypoxia may need a  repeat CT chest versus ABG. CPAP nightly. Continue Dulera, Incruse. Follow.  6. Diverticulosis of the colon without hemorrhage/polyps Status post polypectomy per recent colonoscopy and EGD that showed a small hiatal hernia, multiple polyps with diverticulosis with no signs of bleeding.   7  acute kidney injury Likely secondary to overdiuresis. Discontinue IV Lasix and ARB. Check a UA. Check urine sodium, urine creatinine. 500 cc normal saline bolus. Follow.  8. Anemia/iron deficiency anemia Patient with no overt bleeding. Patient underwent EGD and colonoscopy 12/25/2019 showing gastritis and gastroduodenitis, diverticulosis without active bleeding, multiple polyps status post polypectomy. Will need outpatient follow-up with GI versus hematology.  9. Well-controlled diabetes mellitus type 2 Hemoglobin A1c 6.3. CBG of 192 this morning. Continue current dose of Levemir 35 units twice daily. Sliding scale insulin. Assess leg syndrome Continue Requip.  10. Hyperlipidemia Continue statin.  11. Morbid obesity   DVT prophylaxis: Lovenox Code Status: Full Family Communication: Updated patient. No family at bedside. Disposition:   Status is: Inpatient    Dispo: The patient is from: Home              Anticipated d/c is to: Home              Anticipated d/c date is: 2 to 3 days.              Patient currently on 5 L nasal cannula, noted to be hypotensive this morning, not stable for discharge.       Consultants:   None  Procedures:   Upper endoscopy 12/25/2019  Colonoscopy 12/25/2019  Chest x-ray 12/25/2019    Antimicrobials:   None   Subjective: Patient laying in bed. Denies any chest pain. Denies any significant shortness of breath. No lightheadedness or dizziness. Patient noted to be hypotensive this morning with systolics in the 32T however remained asymptomatic. On 5 L nasal cannula. Off BiPAP.  Objective: Vitals:   12/26/19 1400 12/26/19 1416 12/26/19 1500 12/26/19 1515  BP: (!) 88/54 (!) 137/99 (!) 72/51 (!) 98/51  Pulse: 73 82 70 73  Resp: (!) 25 19 20 20   Temp:  98.7 F (37.1 C)    TempSrc:  Oral    SpO2: 93% (!) 88%    Weight:      Height:        Intake/Output Summary (Last 24 hours) at 12/26/2019 1750 Last data filed at 12/26/2019  0658 Gross per 24 hour  Intake 603 ml  Output 700 ml  Net -97 ml   Filed Weights   12/25/19 2033 12/26/19 0500  Weight: 108.6 kg 107.5 kg    Examination:  General exam: Appears calm and comfortable  Respiratory system: Clear to auscultation. Some decreased breath sounds in the bases. No wheezes, no crackles, no rhonchi. Speaking in full sentences. No use of accessory muscles of respiration.  Cardiovascular system: Regular rate rhythm no murmurs rubs or gallops. No JVD. No lower extremity edema. Gastrointestinal system: Abdomen is nondistended, soft and nontender. No organomegaly or masses felt. Normal bowel sounds heard. Central nervous system: Alert and oriented. No focal neurological deficits. Extremities: Symmetric 5 x 5 power. Skin: No rashes, lesions or ulcers Psychiatry: Judgement and insight appear normal. Mood & affect appropriate.     Data Reviewed: I have personally reviewed following labs and imaging studies  CBC: Recent Labs  Lab 12/25/19 2120 12/26/19 0550  WBC 2.3* 2.7*  HGB 10.2* 8.9*  HCT 32.3* 28.5*  MCV 108.4* 108.0*  PLT 130* 113*    Basic  Metabolic Panel: Recent Labs  Lab 12/25/19 2120 12/26/19 0550  NA  --  140  K  --  4.0  CL  --  100  CO2  --  28  GLUCOSE  --  244*  BUN  --  26*  CREATININE 1.56* 1.96*  CALCIUM  --  8.8*    GFR: Estimated Creatinine Clearance: 26.3 mL/min (A) (by C-G formula based on SCr of 1.96 mg/dL (H)).  Liver Function Tests: No results for input(s): AST, ALT, ALKPHOS, BILITOT, PROT, ALBUMIN in the last 168 hours.  CBG: Recent Labs  Lab 12/25/19 2209 12/26/19 0801 12/26/19 1151 12/26/19 1439 12/26/19 1632  GLUCAP 200* 192* 252* 229* 216*     Recent Results (from the past 240 hour(s))  SARS CORONAVIRUS 2 (TAT 6-24 HRS) Nasopharyngeal Nasopharyngeal Swab     Status: None   Collection Time: 12/21/19  1:52 PM   Specimen: Nasopharyngeal Swab  Result Value Ref Range Status   SARS Coronavirus 2 NEGATIVE  NEGATIVE Final    Comment: (NOTE) SARS-CoV-2 target nucleic acids are NOT DETECTED.  The SARS-CoV-2 RNA is generally detectable in upper and lower respiratory specimens during the acute phase of infection. Negative results do not preclude SARS-CoV-2 infection, do not rule out co-infections with other pathogens, and should not be used as the sole basis for treatment or other patient management decisions. Negative results must be combined with clinical observations, patient history, and epidemiological information. The expected result is Negative.  Fact Sheet for Patients: SugarRoll.be  Fact Sheet for Healthcare Providers: https://www.woods-mathews.com/  This test is not yet approved or cleared by the [place] FDA and  has been authorized for detection and/or diagnosis of SARS-CoV-2 by FDA under an Emergency Use Authorization (EUA). This EUA will remain  in effect (meaning this test can be used) for the duration of the COVID-19 declaration under Se ction 564(b)(1) of the Act, 21 U.S.C. section 360bbb-3(b)(1), unless the authorization is terminated or revoked sooner.  Performed at [facility], [place] [address] [place], [place] [address]   SARS Coronavirus 2 by RT PCR (hospital order, performed in [facility] hospital lab) Nasopharyngeal Nasopharyngeal Swab     Status: None   Collection Time: 12/26/19  1:17 PM   Specimen: Nasopharyngeal Swab  Result Value Ref Range Status   SARS Coronavirus 2 NEGATIVE NEGATIVE Final    Comment: (NOTE) SARS-CoV-2 target nucleic acids are NOT DETECTED.  The SARS-CoV-2 RNA is generally detectable in upper and lower respiratory specimens during the acute phase of infection. The lowest concentration of SARS-CoV-2 viral copies this assay can detect is 250 copies / mL. A negative result does not preclude SARS-CoV-2 infection and should not be used as the sole basis for treatment or other patient  management decisions.  A negative result may occur with improper specimen collection / handling, submission of specimen other than nasopharyngeal swab, presence of viral mutation(s) within the areas targeted by this assay, and inadequate number of viral copies (<250 copies / mL). A negative result must be combined with clinical observations, patient history, and epidemiological information.  Fact Sheet for Patients:   StrictlyIdeas.no  Fact Sheet for Healthcare Providers: BankingDealers.co.za  This test is not yet approved or  cleared by the [place] FDA and has been authorized for detection and/or diagnosis of SARS-CoV-2 by FDA under an Emergency Use Authorization (EUA).  This EUA will remain in effect (meaning this test can be used) for the duration of the COVID-19 declaration under Section  564(b)(1) of the Act, 21 U.S.C. section 360bbb-3(b)(1), unless the authorization is terminated or revoked sooner.  Performed at [facility], [place] [address], [place], [place] [address]          Radiology Studies: DG CHEST PORT 1 VIEW  Result Date: 12/25/2019 CLINICAL DATA:  Shortness of breath. EXAM: PORTABLE CHEST 1 VIEW COMPARISON:  CT 08/28/2019.  Chest x-ray 07/24/2019. FINDINGS: Cardiomegaly. Diffuse bilateral pulmonary infiltrates/edema. Small bilateral pleural effusions. No pneumothorax. Thoracic spine scoliosis and degenerative change. IMPRESSION: 1.  Cardiomegaly. 2. Diffuse bilateral pulmonary infiltrates/edema. Small bilateral pleural effusions. Electronically Signed   By: Marcello Moores  Register   On: 12/25/2019 13:26        Scheduled Meds: . aspirin EC  81 mg Oral Daily  . atorvastatin  80 mg Oral q1800  . buPROPion  300 mg Oral Daily  . chlorhexidine  15 mL Mouth Rinse BID  . enoxaparin (LOVENOX) injection  40 mg Subcutaneous Q24H  . escitalopram  20 mg Oral Daily  . gabapentin  200 mg Oral BID  . insulin  aspart  0-15 Units Subcutaneous TID WC  . insulin aspart  0-5 Units Subcutaneous QHS  . insulin aspart  3 Units Subcutaneous TID WC  . insulin detemir  35 Units Subcutaneous BID  . mouth rinse  15 mL Mouth Rinse q12n4p  . metoprolol succinate  25 mg Oral QHS  . mometasone-formoterol  2 puff Inhalation BID   And  . umeclidinium bromide  1 puff Inhalation Daily  . pantoprazole  40 mg Oral BID AC  . rOPINIRole  6 mg Oral BID  . sodium chloride flush  3 mL Intravenous Q12H  . tamsulosin  0.4 mg Oral Daily   Continuous Infusions: . sodium chloride       LOS: 1 day    Time spent: 40 minutes    Irine Seal, MD [facility]   To contact the attending provider between 7A-7P or the covering provider during after hours 7P-7A, please log into the web site [email or web address] and access using [place] password for that web site. If you do not have the password, please call the hospital operator.  12/26/2019, 5:50 PM

## 2019-12-27 ENCOUNTER — Inpatient Hospital Stay (HOSPITAL_COMMUNITY): Payer: PPO

## 2019-12-27 DIAGNOSIS — G4733 Obstructive sleep apnea (adult) (pediatric): Secondary | ICD-10-CM | POA: Diagnosis not present

## 2019-12-27 DIAGNOSIS — J449 Chronic obstructive pulmonary disease, unspecified: Secondary | ICD-10-CM

## 2019-12-27 DIAGNOSIS — I10 Essential (primary) hypertension: Secondary | ICD-10-CM

## 2019-12-27 DIAGNOSIS — G2581 Restless legs syndrome: Secondary | ICD-10-CM

## 2019-12-27 LAB — CBC WITH DIFFERENTIAL/PLATELET
Abs Immature Granulocytes: 0.01 10*3/uL (ref 0.00–0.07)
Basophils Absolute: 0 10*3/uL (ref 0.0–0.1)
Basophils Relative: 1 %
Eosinophils Absolute: 0.1 10*3/uL (ref 0.0–0.5)
Eosinophils Relative: 3 %
HCT: 25.3 % — ABNORMAL LOW (ref 36.0–46.0)
Hemoglobin: 7.8 g/dL — ABNORMAL LOW (ref 12.0–15.0)
Immature Granulocytes: 1 %
Lymphocytes Relative: 27 %
Lymphs Abs: 0.5 10*3/uL — ABNORMAL LOW (ref 0.7–4.0)
MCH: 33.6 pg (ref 26.0–34.0)
MCHC: 30.8 g/dL (ref 30.0–36.0)
MCV: 109.1 fL — ABNORMAL HIGH (ref 80.0–100.0)
Monocytes Absolute: 0.1 10*3/uL (ref 0.1–1.0)
Monocytes Relative: 3 %
Neutro Abs: 1.3 10*3/uL — ABNORMAL LOW (ref 1.7–7.7)
Neutrophils Relative %: 65 %
Platelets: 100 10*3/uL — ABNORMAL LOW (ref 150–400)
RBC: 2.32 MIL/uL — ABNORMAL LOW (ref 3.87–5.11)
RDW: 15.7 % — ABNORMAL HIGH (ref 11.5–15.5)
WBC: 1.9 10*3/uL — ABNORMAL LOW (ref 4.0–10.5)
nRBC: 0 % (ref 0.0–0.2)

## 2019-12-27 LAB — GLUCOSE, CAPILLARY
Glucose-Capillary: 173 mg/dL — ABNORMAL HIGH (ref 70–99)
Glucose-Capillary: 158 mg/dL — ABNORMAL HIGH (ref 70–99)
Glucose-Capillary: 169 mg/dL — ABNORMAL HIGH (ref 70–99)
Glucose-Capillary: 141 mg/dL — ABNORMAL HIGH (ref 70–99)

## 2019-12-27 LAB — I-STAT CHEM 8, ED
BUN: 18 mg/dL (ref 8–23)
Calcium, Ion: 1.16 mmol/L (ref 1.15–1.40)
Chloride: 102 mmol/L (ref 98–111)
Creatinine, Ser: 1.3 mg/dL — ABNORMAL HIGH (ref 0.44–1.00)
Glucose, Bld: 177 mg/dL — ABNORMAL HIGH (ref 70–99)
HCT: 30 % — ABNORMAL LOW (ref 36.0–46.0)
Hemoglobin: 10.2 g/dL — ABNORMAL LOW (ref 12.0–15.0)
Potassium: 4.5 mmol/L (ref 3.5–5.1)
Sodium: 139 mmol/L (ref 135–145)
TCO2: 26 mmol/L (ref 22–32)

## 2019-12-27 LAB — URINALYSIS, ROUTINE W REFLEX MICROSCOPIC
Bilirubin Urine: NEGATIVE
Glucose, UA: NEGATIVE mg/dL
Ketones, ur: NEGATIVE mg/dL
Leukocytes,Ua: NEGATIVE
Nitrite: NEGATIVE
Protein, ur: NEGATIVE mg/dL
Specific Gravity, Urine: 1.016 (ref 1.005–1.030)
pH: 5 (ref 5.0–8.0)

## 2019-12-27 LAB — BASIC METABOLIC PANEL
Anion gap: 9 (ref 5–15)
BUN: 37 mg/dL — ABNORMAL HIGH (ref 8–23)
CO2: 27 mmol/L (ref 22–32)
Calcium: 8.4 mg/dL — ABNORMAL LOW (ref 8.9–10.3)
Chloride: 101 mmol/L (ref 98–111)
Creatinine, Ser: 2.06 mg/dL — ABNORMAL HIGH (ref 0.44–1.00)
GFR calc Af Amer: 26 mL/min — ABNORMAL LOW (ref >60)
GFR calc non Af Amer: 22 mL/min — ABNORMAL LOW (ref >60)
Glucose, Bld: 216 mg/dL — ABNORMAL HIGH (ref 70–99)
Potassium: 3.6 mmol/L (ref 3.5–5.1)
Sodium: 137 mmol/L (ref 135–145)

## 2019-12-27 LAB — SODIUM, URINE, RANDOM: Sodium, Ur: 74 mmol/L

## 2019-12-27 LAB — CREATININE, URINE, RANDOM: Creatinine, Urine: 156.8 mg/dL

## 2019-12-27 MED ORDER — SODIUM CHLORIDE 0.9 % IV SOLN: INTRAVENOUS | Status: AC

## 2019-12-27 MED ORDER — HEPARIN SODIUM (PORCINE) 5000 UNIT/ML IJ SOLN
5000.0000 [IU] | Freq: Three times a day (TID) | INTRAMUSCULAR | Status: DC
  Administered 2019-12-27 – 2019-12-28 (×4): 5000 [IU] via SUBCUTANEOUS
  Filled 2019-12-27 (×5): qty 1

## 2019-12-27 NOTE — Procedures (Signed)
° ° °  Patient Name: Janet Choi, Janet Choi Date: 12/16/2019 Gender: Female D.O.B: 15-Dec-1940 Age (years): 79 Referring Provider: Baltazar Apo Height (inches): 61 Interpreting Physician: Chesley Mires MD, ABSM Weight (lbs): 243 RPSGT: Jorge Ny BMI: 46 MRN: [id number] Neck Size: 16.50  CLINICAL INFORMATION The patient is referred for a CPAP titration to treat sleep apnea.  Date of NPSG 10/31/19: AHI 52.1.  SLEEP STUDY TECHNIQUE As per the AASM Manual for the Scoring of Sleep and Associated Events v2.3 (April 2016) with a hypopnea requiring 4% desaturations.  The channels recorded and monitored were frontal, central and occipital EEG, electrooculogram (EOG), submentalis EMG (chin), nasal and oral airflow, thoracic and abdominal wall motion, anterior tibialis EMG, snore microphone, electrocardiogram, and pulse oximetry. Continuous positive airway pressure (CPAP) was initiated at the beginning of the study and titrated to treat sleep-disordered breathing.  MEDICATIONS Medications self-administered by patient taken the night of the study : LIPITOR, BASAGLAR, GABAPENTIN, PROTONIX, REQUIP, VITAMIN D, FERROUS SULFATE, NASACORT  TECHNICIAN COMMENTS Comments added by technician: NONE Comments added by scorer: N/A  RESPIRATORY PARAMETERS Optimal PAP Pressure (cm): 17 AHI at Optimal Pressure (/hr): 7.2 Overall Minimal O2 (%): 75.0 Supine % at Optimal Pressure (%): 0 Minimal O2 at Optimal Pressure (%): 83.0   SLEEP ARCHITECTURE The study was initiated at 11:15:21 PM and ended at 5:14:15 AM.  Sleep onset time was 38.2 minutes and the sleep efficiency was 40.4%%. The total sleep time was 145 minutes.  The patient spent 14.5%% of the night in stage N1 sleep, 85.5%% in stage N2 sleep, 0.0%% in stage N3 and 0% in REM.Stage REM latency was N/A minutes  Wake after sleep onset was 175.7. Alpha intrusion was absent. Supine sleep was 53.10%.  CARDIAC DATA The 2 lead EKG demonstrated sinus  rhythm. The mean heart rate was 78.1 beats per minute. Other EKG findings include: None.  LEG MOVEMENT DATA The total Periodic Limb Movements of Sleep (PLMS) were 0. The PLMS index was 0.0. A PLMS index of <15 is considered normal in adults.  IMPRESSIONS - She had difficult with sleep initation and sleep maintenance during this study.  She did not have REM sleep or slow wave sleep during this study. - Suboptimal CPAP titration study.  She was titrated up to CPAP 17 cm H2O.  She still had obstructive respiratory events and oxygen desaturation. - She was not tried on supplemental oxygen with CPAP during this study.  DIAGNOSIS - Obstructive Sleep Apnea (G47.33)  RECOMMENDATIONS - She did not improve sufficient with CPAP therapy, even with high pressure settings.  She should return to the sleep lab for a Bipap titration study.  She might also need to be titrated for supplemental oxygen with PAP therapy.  [Electronically signed] 12/27/2019 08:50 AM  Chesley Mires MD, ABSM Diplomate, American Board of Sleep Medicine   NPI: [id number]

## 2019-12-27 NOTE — Progress Notes (Signed)
PROGRESS NOTE    Janet Choi  DUK:[id number] DOB: 1941/04/17 DOA: 12/25/2019 PCP: [name], MD    Chief Complaint  Patient presents with  . Shortness of Breath    Brief Narrative:  HPI per Dr. Aileen Fass Janet Choi is a 79 y.o. 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 who was getting a colonoscopy and endoscopy for pancytopenia at the [facility] long endoscopy suite when she started getting hypoxic, was placed on a BiPAP and her saturations improved, chest x-ray was done that shows pleural effusions cardiomegaly and bilateral interstitial infiltrates, she had a CT scan on 12/06/2019 that showed no effusions and no pulmonary edema so we are consulted for further evaluation and admission, I have asked them to transfer the patient to the emergency room as we do not have any beds and we have multiple patients waiting at [facility] long ED and she needs a closer care, will continue her on BiPAP.   Assessment & Plan:   Principal Problem:   Acute on chronic respiratory failure with hypoxia (HCC) Active Problems:   Hypotension due to hypovolemia   Hyperlipidemia associated with type 2 diabetes mellitus ([place])   Essential hypertension   Morbid obesity due to excess calories (HCC)   RLS (restless legs syndrome)   Type 2 diabetes mellitus with hyperlipidemia (HCC)   OSA and COPD overlap syndrome (HCC)   Acute on chronic diastolic CHF (congestive heart failure) (HCC)   Gastritis and gastroduodenitis   Acute diastolic heart failure (HCC)   AKI (acute kidney injury) ([place])   Obesity, Class III, BMI 40-49.9 (morbid obesity) ([place])  1 acute respiratory failure with hypoxia felt secondary to acute on chronic diastolic CHF exacerbation Patient noted to be hypoxic requiring BiPAP while getting colonoscopy endoscopy.  Patient with history of COPD oxygen dependent chronically on 3 L nasal cannula on ambulation. Volume status difficult to evaluate due to patient's morbid obesity. Chest x-ray done on admission concerning for pulmonary edema with bilateral pleural effusions compared with CT abdomen and pelvis of 12/06/2019. Patient noted to be on Lasix as needed prior to admission. Patient stated initially was on scheduled Lasix however due to severe cramping was weaned down and currently on Lasix as needed. Clinical improvement. Off BiPAP currently on 3 L nasal cannula.  Repeat chest x-ray with some improvement.  Patient on IV Lasix 80 mg twice daily urine output not properly recorded.  Creatinine trending up.  Urine creatinine, renal ultrasound.  Gentle hydration.  Diuretics and Cozaar been discontinued.  CPAP nightly.  Follow.   2. Hypotension Likely secondary to hypovolemia secondary to aggressive diuresis over the past 24 hours. Patient currently asymptomatic. Blood pressure improved.  Place on gentle hydration.  Monitor.   3. Hypertension IV Lasix and Cozaar have been discontinued.  Blood pressure noted to be hypotensive/borderline.  Placed on gentle hydration.  Fluid boluses as needed.  Follow.    4. Gastritis and gastroduodenitis Per EGD.  PPI twice daily.    5. COPD oxygen dependent/OSA Patient noted to be on 3 L nasal cannula home O2 on ambulation. Patient currently on 3 L nasal cannula required BiPAP the night of 12/25/2019, secondary to problem #1. No significant wheezing noted. Fair air movement. Wean O2 to keep sats between 88 to 93%. If no significant improvement with hypoxia may need a repeat  CT chest versus ABG. CPAP nightly. Continue Dulera, Incruse. Follow.  6. Diverticulosis of the colon without hemorrhage/polyps Status post polypectomy per recent colonoscopy and EGD that showed a small hiatal hernia, multiple polyps with diverticulosis with no signs of bleeding.   7 acute kidney injury Likely  secondary to overdiuresis.  Creatinine trending back up.  IV Lasix and ARB have been discontinued.  Urine sodium of 74, urine creatinine of 156.80.  Urine output not properly recorded.  Place on gentle hydration, check a renal ultrasound.  If no significant improvement in renal function the next 24 hours we will consult with nephrology for further evaluation and management.   8. Anemia/iron deficiency anemia Patient with no overt bleeding. Patient underwent EGD and colonoscopy 12/25/2019 showing gastritis and gastroduodenitis, diverticulosis without active bleeding, multiple polyps status post polypectomy.  Hemoglobin currently at 7.8 from 10.2 on admission.  Will need outpatient follow-up with GI and hematology.  9. Well-controlled diabetes mellitus type 2 Hemoglobin A1c 6.3. CBG of 173 this morning. Continue current dose of Levemir 35 units twice daily. Sliding scale insulin.   10 restless leg syndrome Continue Requip.  11. Hyperlipidemia Statin.    12. Morbid obesity   DVT prophylaxis: Heparin Code Status: Full Family Communication: Updated patient. No family at bedside. Disposition:   Status is: Inpatient    Dispo: The patient is from: Home              Anticipated d/c is to: Home              Anticipated d/c date is: 2 to 3 days.              Patient currently on 3 L nasal cannula, noted to be hypotensive on 12/26/2019.  Creatinine trending up.  Not stable for discharge.        Consultants:   None  Procedures:   Upper endoscopy 12/25/2019  Colonoscopy 12/25/2019  Chest x-ray 12/25/2019    Antimicrobials:   None   Subjective: Patient sitting up in chair about to eat her lunch.  Denies any chest pain.  Denies any significant shortness of breath.  O2 requirements improving currently on 3 L nasal cannula.  Blood pressure improved.  Patient denies any overt bleeding.   Objective: Vitals:   12/27/19 0059 12/27/19 0200 12/27/19 0604 12/27/19 0824  BP: (!) 101/53  108/61 (!) 120/59   Pulse: 74 67 66   Resp: (!) 22 (!) 22 20   Temp:  (!) 97.1 F (36.2 C) 98.3 F (36.8 C)   TempSrc:  Axillary Oral   SpO2: 92% 93% 97% 96%  Weight:      Height:        Intake/Output Summary (Last 24 hours) at 12/27/2019 1238 Last data filed at 12/27/2019 1000 Gross per 24 hour  Intake 300 ml  Output --  Net 300 ml   Filed Weights   12/25/19 2033 12/26/19 0500  Weight: 108.6 kg 107.5 kg    Examination:  General exam: NAD Respiratory system: Decreased breath sounds in the bases.  No wheezing.  No crackles.  Speaking in full sentences.  No use of accessory muscles of respiration.   Cardiovascular system: RRR no murmurs rubs or gallops.  No JVD.  No lower extremity edema.   Gastrointestinal system: Abdomen is soft, nontender, nondistended, positive bowel sounds.  No rebound.  No guarding Central nervous system: Alert and oriented. No focal neurological deficits. Extremities: Symmetric 5 x 5 power. Skin: No rashes, lesions or  ulcers Psychiatry: Judgement and insight appear normal. Mood & affect appropriate.     Data Reviewed: I have personally reviewed following labs and imaging studies  CBC: Recent Labs  Lab 12/25/19 1328 12/25/19 2120 12/26/19 0550 12/27/19 0536  WBC  --  2.3* 2.7* 1.9*  NEUTROABS  --   --   --  1.3*  HGB 10.2* 10.2* 8.9* 7.8*  HCT 30.0* 32.3* 28.5* 25.3*  MCV  --  108.4* 108.0* 109.1*  PLT  --  130* 113* 100*    Basic Metabolic Panel: Recent Labs  Lab 12/25/19 1328 12/25/19 2120 12/26/19 0550 12/27/19 0536  NA 139  --  140 137  K 4.5  --  4.0 3.6  CL 102  --  100 101  CO2  --   --  28 27  GLUCOSE 177*  --  244* 216*  BUN 18  --  26* 37*  CREATININE 1.30* 1.56* 1.96* 2.06*  CALCIUM  --   --  8.8* 8.4*    GFR: Estimated Creatinine Clearance: 25.1 mL/min (A) (by C-G formula based on SCr of 2.06 mg/dL (H)).  Liver Function Tests: No results for input(s): AST, ALT, ALKPHOS, BILITOT, PROT, ALBUMIN in the last 168  hours.  CBG: Recent Labs  Lab 12/26/19 1151 12/26/19 1439 12/26/19 1632 12/26/19 1956 12/27/19 0744  GLUCAP 252* 229* 216* 161* 173*     Recent Results (from the past 240 hour(s))  SARS CORONAVIRUS 2 (TAT 6-24 HRS) Nasopharyngeal Nasopharyngeal Swab     Status: None   Collection Time: 12/21/19  1:52 PM   Specimen: Nasopharyngeal Swab  Result Value Ref Range Status   SARS Coronavirus 2 NEGATIVE NEGATIVE Final    Comment: (NOTE) SARS-CoV-2 target nucleic acids are NOT DETECTED.  The SARS-CoV-2 RNA is generally detectable in upper and lower respiratory specimens during the acute phase of infection. Negative results do not preclude SARS-CoV-2 infection, do not rule out co-infections with other pathogens, and should not be used as the sole basis for treatment or other patient management decisions. Negative results must be combined with clinical observations, patient history, and epidemiological information. The expected result is Negative.  Fact Sheet for Patients: SugarRoll.be  Fact Sheet for Healthcare Providers: https://www.woods-mathews.com/  This test is not yet approved or cleared by the [place] FDA and  has been authorized for detection and/or diagnosis of SARS-CoV-2 by FDA under an Emergency Use Authorization (EUA). This EUA will remain  in effect (meaning this test can be used) for the duration of the COVID-19 declaration under Se ction 564(b)(1) of the Act, 21 U.S.C. section 360bbb-3(b)(1), unless the authorization is terminated or revoked sooner.  Performed at [facility], [place] [address] [place], [place] [address]   SARS Coronavirus 2 by RT PCR (hospital order, performed in [facility] hospital lab) Nasopharyngeal Nasopharyngeal Swab     Status: None   Collection Time: 12/26/19  1:17 PM   Specimen: Nasopharyngeal Swab  Result Value Ref Range Status   SARS Coronavirus 2 NEGATIVE NEGATIVE Final    Comment:  (NOTE) SARS-CoV-2 target nucleic acids are NOT DETECTED.  The SARS-CoV-2 RNA is generally detectable in upper and lower respiratory specimens during the acute phase of infection. The lowest concentration of SARS-CoV-2 viral copies this assay can detect is 250 copies / mL. A negative result does not preclude SARS-CoV-2 infection and should not be used as the sole basis for treatment or other patient management decisions.  A negative result may occur with improper  specimen collection / handling, submission of specimen other than nasopharyngeal swab, presence of viral mutation(s) within the areas targeted by this assay, and inadequate number of viral copies (<250 copies / mL). A negative result must be combined with clinical observations, patient history, and epidemiological information.  Fact Sheet for Patients:   StrictlyIdeas.no  Fact Sheet for Healthcare Providers: BankingDealers.co.za  This test is not yet approved or  cleared by the [place] FDA and has been authorized for detection and/or diagnosis of SARS-CoV-2 by FDA under an Emergency Use Authorization (EUA).  This EUA will remain in effect (meaning this test can be used) for the duration of the COVID-19 declaration under Section 564(b)(1) of the Act, 21 U.S.C. section 360bbb-3(b)(1), unless the authorization is terminated or revoked sooner.  Performed at [facility], [place] [address], [place], [place] [address]          Radiology Studies: DG CHEST PORT 1 VIEW  Result Date: 12/26/2019 CLINICAL DATA:  Hypoxia. EXAM: PORTABLE CHEST 1 VIEW COMPARISON:  Radiograph yesterday.  CT 06/30/2019 FINDINGS: Cardiomegaly is stable. Unchanged mediastinal contours. Improving interstitial opacities from prior exam. No confluent airspace disease. No pneumothorax. No large pleural effusion. Degenerative change of both shoulders. IMPRESSION: Stable cardiomegaly.  Improving interstitial opacities from prior exam may represent pulmonary edema or infection. No new abnormality. Electronically Signed   By: Keith Rake M.D.   On: 12/26/2019 20:30   DG CHEST PORT 1 VIEW  Result Date: 12/25/2019 CLINICAL DATA:  Shortness of breath. EXAM: PORTABLE CHEST 1 VIEW COMPARISON:  CT 08/28/2019.  Chest x-ray 07/24/2019. FINDINGS: Cardiomegaly. Diffuse bilateral pulmonary infiltrates/edema. Small bilateral pleural effusions. No pneumothorax. Thoracic spine scoliosis and degenerative change. IMPRESSION: 1.  Cardiomegaly. 2. Diffuse bilateral pulmonary infiltrates/edema. Small bilateral pleural effusions. Electronically Signed   By: Marcello Moores  Register   On: 12/25/2019 13:26        Scheduled Meds: . aspirin EC  81 mg Oral Daily  . atorvastatin  80 mg Oral q1800  . buPROPion  300 mg Oral Daily  . chlorhexidine  15 mL Mouth Rinse BID  . escitalopram  20 mg Oral Daily  . gabapentin  200 mg Oral BID  . heparin injection (subcutaneous)  5,000 Units Subcutaneous Q8H  . insulin aspart  0-15 Units Subcutaneous TID WC  . insulin aspart  0-5 Units Subcutaneous QHS  . insulin aspart  3 Units Subcutaneous TID WC  . insulin detemir  35 Units Subcutaneous BID  . mouth rinse  15 mL Mouth Rinse q12n4p  . metoprolol succinate  25 mg Oral QHS  . mometasone-formoterol  2 puff Inhalation BID   And  . umeclidinium bromide  1 puff Inhalation Daily  . pantoprazole  40 mg Oral BID AC  . rOPINIRole  6 mg Oral BID  . sodium chloride flush  3 mL Intravenous Q12H  . tamsulosin  0.4 mg Oral Daily   Continuous Infusions: . sodium chloride    . sodium chloride 75 mL/hr at 12/27/19 0919     LOS: 2 days    Time spent: 40 minutes    Irine Seal, MD [facility]   To contact the attending provider between 7A-7P or the covering provider during after hours 7P-7A, please log into the web site [email or web address] and access using universal [place] password for that web site.  If you do not have the password, please call the hospital operator.  12/27/2019, 12:38 PM

## 2019-12-27 NOTE — Progress Notes (Signed)
Pt refuses to wear cpap tonight, prefers to keep nasal cannula on instead.  Cpap machine in room on standby as needed.  Pt was encouraged to call, should she change her mind.

## 2019-12-27 NOTE — Evaluation (Signed)
Physical Therapy Evaluation Patient Details Name: Janet Choi MRN: [id number] DOB: 12/06/1940 Today's Date: 12/27/2019   History of Present Illness  Janet Choi is a 79 y.o. 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 Patient admitted with acute respiratory failure with hypoxia felt secondary to acute on chronic diastolic CHF exacerbation requring Bipap.  Clinical Impression  Pt admitted with above diagnosis.  Pt currently with functional limitations due to the deficits listed below (see PT Problem List). Pt will benefit from skilled PT to increase their independence and safety with mobility to allow discharge to the venue listed below.  Pt very eager to mobilize.  Pt required 2L O2 [place] for ambulating. Pt encouraged to ambulate with nursing staff.  Will check back on pt during hospitalization however no f/u PT recommended at this time upon d/c.  SATURATION QUALIFICATIONS: (This note is used to comply with regulatory documentation for home oxygen)  Patient Saturations on Room Air at Rest =   Patient Saturations on Room Air while Ambulating = 86%  Patient Saturations on 2 Liters of oxygen while Ambulating = 93%  Please briefly explain why patient needs home oxygen: to improve oxygen saturations during physical activity such as ambulation.      Follow Up Recommendations No PT follow up    Equipment Recommendations  None recommended by PT    Recommendations for Other Services       Precautions / Restrictions Precautions Precautions: None Precaution Comments: monitor sats Restrictions Weight Bearing Restrictions: No      Mobility  Bed Mobility Overal bed mobility: Independent             General  bed mobility comments: pt in recliner  Transfers Overall transfer level: Modified independent               General transfer comment: Patient demonstrates ability to stand, ambulate short distance to recliner (distance limited by lines and leads) maintaing prolonged standing and performing sit to stand for dressing without assitance from therapist.  Ambulation/Gait Ambulation/Gait assistance: Supervision Gait Distance (Feet): 280 Feet Assistive device: IV Pole Gait Pattern/deviations: Step-through pattern;Decreased stride length     General Gait Details: pt pushed IV pole for a little support (has cane at home), SPO2 dropped to 86% on room air and pt required 2L O2 for SPO2 93%  Stairs            Wheelchair Mobility    Modified Rankin (Stroke Patients Only)       Balance Overall balance assessment: No apparent balance deficits (not formally assessed)                                           Pertinent Vitals/Pain Pain Assessment: No/denies pain    Home Living Family/patient expects to be discharged to:: Private residence Living Arrangements: Non-relatives/Friends Available Help at Discharge: Family Type of Home: House Home Access: Stairs to enter Entrance Stairs-Rails: Can reach both Entrance Stairs-Number of Steps: 4 Home Layout: One level Home Equipment: Kasandra Knudsen - quad;Walker - 2 wheels;Shower seat;Grab bars - tub/shower      Prior Function Level of Independence: Independent  Comments: wears O2 as needed, mainly at night     Hand Dominance   Dominant Hand: Right    Extremity/Trunk Assessment        Lower Extremity Assessment Lower Extremity Assessment: Overall WFL for tasks assessed    Cervical / Trunk Assessment Cervical / Trunk Assessment: Normal  Communication   Communication: No difficulties  Cognition Arousal/Alertness: Awake/alert Behavior During Therapy: WFL for tasks assessed/performed Overall  Cognitive Status: Within Functional Limits for tasks assessed                                        General Comments      Exercises     Assessment/Plan    PT Assessment Patient needs continued PT services  PT Problem List Decreased mobility;Cardiopulmonary status limiting activity       PT Treatment Interventions DME instruction;Gait training;Therapeutic exercise;Functional mobility training;Therapeutic activities;Patient/family education    PT Goals (Current goals can be found in the Care Plan section)  Acute Rehab PT Goals Patient Stated Goal: To go home PT Goal Formulation: With patient Time For Goal Achievement: 01/03/20 Potential to Achieve Goals: Good    Frequency Min 3X/week   Barriers to discharge        Co-evaluation               AM-PAC PT "6 Clicks" Mobility  Outcome Measure Help needed turning from your back to your side while in a flat bed without using bedrails?: None Help needed moving from lying on your back to sitting on the side of a flat bed without using bedrails?: None Help needed moving to and from a bed to a chair (including a wheelchair)?: None Help needed standing up from a chair using your arms (e.g., wheelchair or bedside chair)?: None Help needed to walk in hospital room?: A Little Help needed climbing 3-5 steps with a railing? : A Little 6 Click Score: 22    End of Session Equipment Utilized During Treatment: Gait belt;Oxygen Activity Tolerance: Patient tolerated treatment well Patient left: in chair;with call bell/phone within reach;with chair alarm set Nurse Communication: Mobility status PT Visit Diagnosis: Difficulty in walking, not elsewhere classified (R26.2)    Time: 0786-7544 PT Time Calculation (min) (ACUTE ONLY): 23 min   Charges:   PT Evaluation $PT Eval Low Complexity: 1 Low        Kati PT, DPT Acute Rehabilitation Services Pager: [phone] Office: [phone]  York Ram  E 12/27/2019, 4:02 PM

## 2019-12-27 NOTE — Evaluation (Signed)
Occupational Therapy Evaluation Patient Details Name: Janet Choi MRN: [id number] DOB: [birth year] Today's Date: 12/27/2019    History of Present Illness Janet Choi is a 79 y.o. 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 Patient admitted with acute respiratory failure with hypoxia felt secondary to acute on chronic diastolic CHF exacerbation requring Bipap.   Clinical Impression   [name] is a 79 year old woman admitted to hospital after becoming hypoxic during endoscopy and required bipap for acute respiratory failure. On evaluation patient demonstrates functional ROM and strength of upper extremities, ability to perform bed mobility and ambulation in room, and ability to perform self care tasks. Patient reports feeling completely normal without complaints of deficits. Patient currently on 4 liters of oxygen and o2 sats maintained above 92% and patient without complaints of shortness of breath. Patient at baseline except for needing oxygen and no OT needs at this time. Please reorder if patient's abilities decline.     Follow Up Recommendations  No OT follow up    Equipment Recommendations  None recommended by OT    Recommendations for Other Services       Precautions / Restrictions Precautions Precautions: None Restrictions Weight Bearing Restrictions: No      Mobility Bed Mobility Overal bed mobility: Independent             General bed mobility comments: Patient independent to transfer to side of bed.  Transfers Overall transfer level: Independent               General transfer comment: Patient demonstrates ability to stand, ambulate short distance to recliner (distance  limited by lines and leads) maintaing prolonged standing and performing sit to stand for dressing without assitance from therapist.    Balance Overall balance assessment: No apparent balance deficits (not formally assessed)                                         ADL either performed or assessed with clinical judgement   ADL Overall ADL's : Modified independent                                       General ADL Comments: Patient able to donn socks and underwear. Patient demonstrates the physical abilities to perform all ADL tasks. Patient limited by lines and leads only.     Vision Patient Visual Report: No change from baseline Vision Assessment?: No apparent visual deficits     Perception     Praxis      Pertinent Vitals/Pain Pain Assessment: No/denies pain     Hand Dominance Right   Extremity/Trunk Assessment     Lower Extremity Assessment Lower Extremity Assessment: Overall WFL for tasks assessed   Cervical / Trunk Assessment Cervical / Trunk Assessment: Normal   Communication Communication Communication: No difficulties   Cognition Arousal/Alertness: Awake/alert Behavior During Therapy: WFL for tasks assessed/performed Overall Cognitive Status: Within Functional Limits for tasks assessed  General Comments       Exercises     Shoulder Instructions      Home Living Family/patient expects to be discharged to:: Private residence Living Arrangements: Non-relatives/Friends Available Help at Discharge: Family Type of Home: House Home Access: Stairs to enter Technical brewer of Steps: 4 Entrance Stairs-Rails: Can reach both [place]: One level     Bathroom Shower/Tub: Occupational psychologist: Handicapped height Bathroom Accessibility: No   Home Equipment: Mining engineer - 2 wheels;Shower seat;Grab bars - tub/shower          Prior  Functioning/Environment Level of Independence: Independent                 OT Problem List:        OT Treatment/Interventions:      OT Goals(Current goals can be found in the care plan section) Acute Rehab OT Goals Patient Stated Goal: To go home OT Goal Formulation: All assessment and education complete, DC therapy Potential to Achieve Goals: Good  OT Frequency:     Barriers to D/C:            Co-evaluation              AM-PAC OT "6 Clicks" Daily Activity     Outcome Measure Help from another person eating meals?: None Help from another person taking care of personal grooming?: None Help from another person toileting, which includes using toliet, bedpan, or urinal?: None Help from another person bathing (including washing, rinsing, drying)?: None Help from another person to put on and taking off regular upper body clothing?: None Help from another person to put on and taking off regular lower body clothing?: None 6 Click Score: 24   End of Session Equipment Utilized During Treatment: Oxygen Nurse Communication:  (okay to see per RN)  Activity Tolerance: Patient tolerated treatment well Patient left: in chair;with chair alarm set  OT Visit Diagnosis: Muscle weakness (generalized) (M62.81)                Time: 5397-6734 OT Time Calculation (min): 26 min Charges:  OT General Charges $OT Visit: 1 Visit OT Evaluation $OT Eval Moderate Complexity: 1 Mod  Chau Sawin, OTR/L [place]  Office [phone] Pager: [phone]   Lenward Chancellor 12/27/2019, 12:17 PM

## 2019-12-27 NOTE — Progress Notes (Signed)
Report received from Ene A., RN. No change from initial pm assessment. Will continue to monitor and follow the POC.

## 2019-12-27 NOTE — Progress Notes (Signed)
In and out catheter performed - specimen obtained.  550 ml of yellow urine returned.  MD notified.  Will continue to monitor.

## 2019-12-27 NOTE — Progress Notes (Signed)
Urine specimen obtained by in and out catheter.  2 RNs present for collection.  550 ml of urine dark yellow urine returned.

## 2019-12-28 ENCOUNTER — Encounter (HOSPITAL_COMMUNITY): Payer: Self-pay | Admitting: Gastroenterology

## 2019-12-28 DIAGNOSIS — R300 Dysuria: Secondary | ICD-10-CM

## 2019-12-28 DIAGNOSIS — N390 Urinary tract infection, site not specified: Secondary | ICD-10-CM

## 2019-12-28 LAB — URINALYSIS, ROUTINE W REFLEX MICROSCOPIC
RBC / HPF: >50 RBC/hpf — ABNORMAL HIGH (ref 0–5)
WBC, UA: >50 WBC/hpf — ABNORMAL HIGH (ref 0–5)

## 2019-12-28 LAB — URINE CULTURE: Culture: NO GROWTH

## 2019-12-28 LAB — BASIC METABOLIC PANEL
Anion gap: 10 (ref 5–15)
BUN: 37 mg/dL — ABNORMAL HIGH (ref 8–23)
CO2: 28 mmol/L (ref 22–32)
Calcium: 8.6 mg/dL — ABNORMAL LOW (ref 8.9–10.3)
Chloride: 103 mmol/L (ref 98–111)
Creatinine, Ser: 1.76 mg/dL — ABNORMAL HIGH (ref 0.44–1.00)
GFR calc Af Amer: 31 mL/min — ABNORMAL LOW (ref >60)
GFR calc non Af Amer: 27 mL/min — ABNORMAL LOW (ref >60)
Glucose, Bld: 177 mg/dL — ABNORMAL HIGH (ref 70–99)
Potassium: 4.1 mmol/L (ref 3.5–5.1)
Sodium: 141 mmol/L (ref 135–145)

## 2019-12-28 LAB — CBC
HCT: 24.9 % — ABNORMAL LOW (ref 36.0–46.0)
Hemoglobin: 7.6 g/dL — ABNORMAL LOW (ref 12.0–15.0)
MCH: 33.6 pg (ref 26.0–34.0)
MCHC: 30.5 g/dL (ref 30.0–36.0)
MCV: 110.2 fL — ABNORMAL HIGH (ref 80.0–100.0)
Platelets: 108 10*3/uL — ABNORMAL LOW (ref 150–400)
RBC: 2.26 MIL/uL — ABNORMAL LOW (ref 3.87–5.11)
RDW: 15.3 % (ref 11.5–15.5)
WBC: 1.7 10*3/uL — ABNORMAL LOW (ref 4.0–10.5)
nRBC: 0 % (ref 0.0–0.2)

## 2019-12-28 LAB — GLUCOSE, CAPILLARY
Glucose-Capillary: 150 mg/dL — ABNORMAL HIGH (ref 70–99)
Glucose-Capillary: 174 mg/dL — ABNORMAL HIGH (ref 70–99)
Glucose-Capillary: 227 mg/dL — ABNORMAL HIGH (ref 70–99)
Glucose-Capillary: 142 mg/dL — ABNORMAL HIGH (ref 70–99)

## 2019-12-28 MED ORDER — SODIUM CHLORIDE 0.9 % IV SOLN
1.0000 g | INTRAVENOUS | Status: DC
  Administered 2019-12-28 – 2019-12-29 (×2): 1 g via INTRAVENOUS
  Filled 2019-12-28 (×2): qty 1

## 2019-12-28 MED ORDER — CEPHALEXIN 500 MG PO CAPS: 500.0000 mg | ORAL_CAPSULE | Freq: Every day | ORAL | 0 refills | Status: DC

## 2019-12-28 NOTE — Anesthesia Postprocedure Evaluation (Signed)
Anesthesia Post Note  Patient: BRANIYA FARRUGIA  Procedure(s) Performed: COLONOSCOPY WITH PROPOFOL (N/A ) ESOPHAGOGASTRODUODENOSCOPY (EGD) WITH PROPOFOL (N/A ) BIOPSY POLYPECTOMY     Patient location during evaluation: PACU Anesthesia Type: MAC Level of consciousness: awake and alert Pain management: pain level controlled Vital Signs Assessment: post-procedure vital signs reviewed and stable Respiratory status: spontaneous breathing (Pt placed on BiPAP in pacu for hypoxia and pulm edema on CXR. Pt stable and being transferred to hospitalists care.) Cardiovascular status: stable and blood pressure returned to baseline Postop Assessment: no apparent nausea or vomiting Anesthetic complications: no   No complications documented.  Last Vitals:  Vitals:   12/25/19 1430 12/25/19 1500  BP:    Pulse: 78   Resp: (!) 23   Temp:    SpO2: 100% 92%    Last Pain:  Vitals:   12/25/19 1509  TempSrc:   PainSc: 0-No pain                 Tiajuana Amass

## 2019-12-28 NOTE — Care Management Important Message (Signed)
Important Message  Patient Details IM Letter given to the Patient Name: Janet Choi MRN: [id number] Date of Birth: [birth year]   Medicare Important Message Given:  Yes     Kerin Salen 12/28/2019, 11:17 AM

## 2019-12-28 NOTE — Plan of Care (Signed)
  Problem: Education: Goal: Knowledge of General Education information will improve Description: Including pain rating scale, medication(s)/side effects and non-pharmacologic comfort measures Outcome: Progressing   Problem: Health Behavior/Discharge Planning: Goal: Ability to manage health-related needs will improve Outcome: Progressing   Problem: Clinical Measurements: Goal: Ability to maintain clinical measurements within normal limits will improve Outcome: Progressing Goal: Will remain free from infection Outcome: Progressing Goal: Diagnostic test results will improve Outcome: Progressing Goal: Respiratory complications will improve Outcome: Progressing Goal: Cardiovascular complication will be avoided Outcome: Progressing   Problem: Activity: Goal: Risk for activity intolerance will decrease Outcome: Progressing   Problem: Nutrition: Goal: Adequate nutrition will be maintained Outcome: Progressing   Problem: Coping: Goal: Level of anxiety will decrease Outcome: Progressing   Problem: Elimination: Goal: Will not experience complications related to bowel motility Outcome: Progressing Goal: Will not experience complications related to urinary retention Outcome: Progressing   Problem: Pain Managment: Goal: General experience of comfort will improve Outcome: Progressing   Problem: Safety: Goal: Ability to remain free from injury will improve Outcome: Progressing   Problem: Skin Integrity: Goal: Risk for impaired skin integrity will decrease Outcome: Progressing   Problem: Activity: Goal: Capacity to carry out activities will improve Outcome: Progressing   Problem: Education: Goal: Ability to demonstrate management of disease process will improve Outcome: Progressing Goal: Ability to verbalize understanding of medication therapies will improve Outcome: Progressing Goal: Individualized Educational Video(s) Outcome: Progressing   Problem: Cardiac: Goal:  Ability to achieve and maintain adequate cardiopulmonary perfusion will improve Outcome: Progressing

## 2019-12-28 NOTE — Progress Notes (Addendum)
Pt. States that she does not want to wear CPAP device.  She feels like she is fine with just wearing [place].

## 2019-12-28 NOTE — Progress Notes (Signed)
PROGRESS NOTE    Janet Choi  ONG:[id number] DOB: Sep 04, 1940 DOA: 12/25/2019 PCP: [name], MD    Chief Complaint  Patient presents with  . Shortness of Breath    Brief Narrative:  HPI per Dr. Aileen Fass Janet Choi is a 79 y.o. 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 who was getting a colonoscopy and endoscopy for pancytopenia at the [facility] long endoscopy suite when she started getting hypoxic, was placed on a BiPAP and her saturations improved, chest x-ray was done that shows pleural effusions cardiomegaly and bilateral interstitial infiltrates, she had a CT scan on 12/06/2019 that showed no effusions and no pulmonary edema so we are consulted for further evaluation and admission, I have asked them to transfer the patient to the emergency room as we do not have any beds and we have multiple patients waiting at [facility] [place] long ED and she needs a closer care, will continue her on BiPAP.   Assessment & Plan:   Principal Problem:   Acute on chronic respiratory failure with hypoxia (HCC) Active Problems:   Hypotension due to hypovolemia   Hyperlipidemia associated with type 2 diabetes mellitus ([place])   Essential hypertension   Morbid obesity due to excess calories (HCC)   RLS (restless legs syndrome)   Type 2 diabetes mellitus with hyperlipidemia (HCC)   OSA and COPD overlap syndrome (HCC)   Acute on chronic diastolic CHF (congestive heart failure) (HCC)   Gastritis and gastroduodenitis   Acute diastolic heart failure (HCC)   AKI (acute kidney injury) ([place])   Obesity, Class III, BMI 40-49.9 (morbid obesity) ([place])  1 acute respiratory failure with hypoxia felt secondary to acute on chronic diastolic CHF exacerbation/flash pulmonary edema Patient noted to be hypoxic requiring BiPAP while getting  colonoscopy endoscopy. Patient with history of COPD oxygen dependent chronically on 3 L nasal cannula on ambulation. Volume status difficult to evaluate due to patient's morbid obesity. Chest x-ray done on admission concerning for pulmonary edema with bilateral pleural effusions compared with CT abdomen and pelvis of 12/06/2019. Patient noted to be on Lasix as needed prior to admission. Patient stated initially was on scheduled Lasix however due to severe cramping was weaned down and currently on Lasix as needed. Clinical improvement. Off BiPAP currently on 2 L nasal cannula.  Repeat chest x-ray with some improvement.  IV Lasix has been discontinued as creatinine was trending up.  Renal ultrasound negative for hydronephrosis however concern for renal stone.  Creatinine trending back down.  Diuretics and Cozaar have been discontinued.  Improved clinically.  Follow.   2. Hypotension Likely secondary to hypovolemia secondary to aggressive diuresis over the past 24 hours. Patient currently asymptomatic. Blood pressure improved with gentle hydration.  Saline lock IV fluids.  3. Hypertension IV Lasix and Cozaar have been discontinued.  Blood pressure noted to be hypotensive/borderline and improved with gentle hydration.  Saline lock IV fluids.  Follow.  4. Gastritis and gastroduodenitis Per EGD.  Continue PPI twice daily.    5. COPD oxygen dependent/OSA Patient noted to be on 3 L nasal cannula home O2 on ambulation. Patient currently on 2 L nasal cannula, required BiPAP the night of 12/25/2019, secondary to problem #1. No significant wheezing noted. Fair air movement. Wean O2 to keep sats between 88 to 93%.  CPAP nightly. Continue Dulera, Incruse.  Outpatient follow-up with pulmonary.  Follow.  6. Diverticulosis of the colon without hemorrhage/polyps Status post polypectomy per recent colonoscopy and EGD that showed a small hiatal hernia, multiple polyps with diverticulosis with no signs of bleeding.   7  acute kidney injury Likely secondary to overdiuresis.  Creatinine was trending back up.  IV Lasix and ARB have been discontinued.  Urine sodium of 74, urine creatinine of 156.80.  Urine output of 975 cc over the past 24 hours.  Renal ultrasound negative for hydronephrosis, bilateral renal cysts, possible 5 mm stone in the left kidney.  Creatinine trending back down.  Follow.   8. Anemia/iron deficiency anemia Patient with no overt bleeding. Patient underwent EGD and colonoscopy 12/25/2019 showing gastritis and gastroduodenitis, diverticulosis without active bleeding, multiple polyps status post polypectomy.  Hemoglobin currently at 7.6 from 10.2 on admission.  May need IV iron.  Transfusion threshold hemoglobin < 7.  Will need outpatient follow-up with GI and hematology.  9. Well-controlled diabetes mellitus type 2 Hemoglobin A1c 6.3. CBG of 150 this morning.  Levemir 35 units twice daily.  Sliding scale insulin.  Follow.   10 restless leg syndrome Requip.   11. Hyperlipidemia Continue statin.    12. Morbid obesity  13.  Dysuria/increased urinary frequency/blood in urine/probable UTI Concern for probable UTI.  Patient also noted to have some blood in her urine per RN.  Patient with small clot noted in urinary hat  Initial urinalysis with greater than 50 WBC, many bacteria, turbid.  Urine cultures pending.  IV Rocephin.  Renal ultrasound done 12/27/2019 - for hydronephrosis, possible 5 mm stone left kidney.  After treatment of UTI may need outpatient follow-up with urology.   DVT prophylaxis: Heparin Code Status: Full Family Communication: Updated patient. No family at bedside. Disposition:   Status is: Inpatient    Dispo: The patient is from: Home              Anticipated d/c is to: Home              Anticipated d/c date is: 1-2 days.              Patient currently on 2 L nasal cannula, noted to be hypotensive on 12/26/2019.  Creatinine improving with hydration.  Patient with urinary  symptoms and some blood in her urine per RN.  Urinalysis ordered and pending.  Currently not stable for discharge.        Consultants:   None  Procedures:   Upper endoscopy 12/25/2019  Colonoscopy 12/25/2019  Chest x-ray 12/25/2019  Renal ultrasound 12/27/2019  Antimicrobials:   IV Rocephin 12/28/2019>>>>>   Subjective: Patient sitting up in chair.  Patient with some complaints of dysuria and increased urinary frequency.  Per RN patient noted to have some blood in her urine.  Patient noted to have a small clot in urinary head.  Urine dark.  Patient denies any chest pain.  No shortness of breath.  Currently on 2 L nasal cannula.    Objective: Vitals:   12/27/19 2053 12/27/19 2103 12/28/19 0458 12/28/19 0754  BP:  113/68 (!) 135/58   Pulse: 71 72 69   Resp: 16 18 20    Temp:  98.1 F (36.7 C) 98.1 F (36.7 C)   TempSrc:  Oral Oral   SpO2: 91% 90% 93% 95%  Weight:   109.6 kg   Height:        Intake/Output Summary (Last 24 hours) at 12/28/2019 1153 Last data filed at 12/28/2019 1119  Gross per 24 hour  Intake 2089.01 ml  Output 425 ml  Net 1664.01 ml   Filed Weights   12/25/19 2033 12/26/19 0500 12/28/19 0458  Weight: 108.6 kg 107.5 kg 109.6 kg    Examination:  General exam: NAD Respiratory system: Decreased breath sounds in the bases otherwise clear.  No wheezing.  No crackles.  Speaking in full sentences.  No use of accessory muscles of respiration.   Cardiovascular system: Regular rate rhythm no murmurs rubs or gallops.  No JVD.  No lower extremity edema.  Gastrointestinal system: Abdomen is soft, obese, nontender, nondistended, positive bowel sounds.  No rebound.  No guarding.  Central nervous system: Alert and oriented. No focal neurological deficits. Extremities: Symmetric 5 x 5 power. Skin: No rashes, lesions or ulcers Psychiatry: Judgement and insight appear normal. Mood & affect appropriate.     Data Reviewed: I have personally reviewed following labs  and imaging studies  CBC: Recent Labs  Lab 12/25/19 1328 12/25/19 2120 12/26/19 0550 12/27/19 0536 12/28/19 0510  WBC  --  2.3* 2.7* 1.9* 1.7*  NEUTROABS  --   --   --  1.3*  --   HGB 10.2* 10.2* 8.9* 7.8* 7.6*  HCT 30.0* 32.3* 28.5* 25.3* 24.9*  MCV  --  108.4* 108.0* 109.1* 110.2*  PLT  --  130* 113* 100* 108*    Basic Metabolic Panel: Recent Labs  Lab 12/25/19 1328 12/25/19 2120 12/26/19 0550 12/27/19 0536 12/28/19 0510  NA 139  --  140 137 141  K 4.5  --  4.0 3.6 4.1  CL 102  --  100 101 103  CO2  --   --  28 27 28   GLUCOSE 177*  --  244* 216* 177*  BUN 18  --  26* 37* 37*  CREATININE 1.30* 1.56* 1.96* 2.06* 1.76*  CALCIUM  --   --  8.8* 8.4* 8.6*    GFR: Estimated Creatinine Clearance: 29.7 mL/min (A) (by C-G formula based on SCr of 1.76 mg/dL (H)).  Liver Function Tests: No results for input(s): AST, ALT, ALKPHOS, BILITOT, PROT, ALBUMIN in the last 168 hours.  CBG: Recent Labs  Lab 12/27/19 1242 12/27/19 1626 12/27/19 2133 12/28/19 0734 12/28/19 1148  GLUCAP 158* 169* 141* 150* 174*     Recent Results (from the past 240 hour(s))  SARS CORONAVIRUS 2 (TAT 6-24 HRS) Nasopharyngeal Nasopharyngeal Swab     Status: None   Collection Time: 12/21/19  1:52 PM   Specimen: Nasopharyngeal Swab  Result Value Ref Range Status   SARS Coronavirus 2 NEGATIVE NEGATIVE Final    Comment: (NOTE) SARS-CoV-2 target nucleic acids are NOT DETECTED.  The SARS-CoV-2 RNA is generally detectable in upper and lower respiratory specimens during the acute phase of infection. Negative results do not preclude SARS-CoV-2 infection, do not rule out co-infections with other pathogens, and should not be used as the sole basis for treatment or other patient management decisions. Negative results must be combined with clinical observations, patient history, and epidemiological information. The expected result is Negative.  Fact Sheet for  Patients: SugarRoll.be  Fact Sheet for Healthcare Providers: https://www.woods-mathews.com/  This test is not yet approved or cleared by the [place] FDA and  has been authorized for detection and/or diagnosis of SARS-CoV-2 by FDA under an Emergency Use Authorization (EUA). This EUA will remain  in effect (meaning this test can be used) for the duration of the COVID-19 declaration under Se ction 564(b)(1) of the Act, 21 U.S.C. section 360bbb-3(b)(1),  unless the authorization is terminated or revoked sooner.  Performed at [facility], [place] [address], [place], [place] [address]   SARS Coronavirus 2 by RT PCR (hospital order, performed in [facility] hospital lab) Nasopharyngeal Nasopharyngeal Swab     Status: None   Collection Time: 12/26/19  1:17 PM   Specimen: Nasopharyngeal Swab  Result Value Ref Range Status   SARS Coronavirus 2 NEGATIVE NEGATIVE Final    Comment: (NOTE) SARS-CoV-2 target nucleic acids are NOT DETECTED.  The SARS-CoV-2 RNA is generally detectable in upper and lower respiratory specimens during the acute phase of infection. The lowest concentration of SARS-CoV-2 viral copies this assay can detect is 250 copies / mL. A negative result does not preclude SARS-CoV-2 infection and should not be used as the sole basis for treatment or other patient management decisions.  A negative result may occur with improper specimen collection / handling, submission of specimen other than nasopharyngeal swab, presence of viral mutation(s) within the areas targeted by this assay, and inadequate number of viral copies (<250 copies / mL). A negative result must be combined with clinical observations, patient history, and epidemiological information.  Fact Sheet for Patients:   StrictlyIdeas.no  Fact Sheet for Healthcare Providers: BankingDealers.co.za  This test is not yet  approved or  cleared by the [place] FDA and has been authorized for detection and/or diagnosis of SARS-CoV-2 by FDA under an Emergency Use Authorization (EUA).  This EUA will remain in effect (meaning this test can be used) for the duration of the COVID-19 declaration under Section 564(b)(1) of the Act, 21 U.S.C. section 360bbb-3(b)(1), unless the authorization is terminated or revoked sooner.  Performed at [facility], [place] [address] [place], [place] [address]   Culture, Urine     Status: None   Collection Time: 12/27/19  8:14 AM   Specimen: Urine, Catheterized  Result Value Ref Range Status   Specimen Description   Final    URINE, CATHETERIZED Performed at [facility] [address], [place], [place] [address]    Special Requests   Final    NONE Performed at [facility], [place] [address] [place], [place] [address]    Culture   Final    NO GROWTH Performed at [facility], [place] [address] [place], [place] [address]    Report Status 12/28/2019 FINAL  Final         Radiology Studies: US RENAL  Result Date: 12/27/2019 CLINICAL DATA:  Acute renal failure EXAM: RENAL / URINARY TRACT ULTRASOUND COMPLETE COMPARISON:  CT 12/05/2019, 10/17/2019 FINDINGS: Right Kidney: Renal measurements: 7.9 x 5.5 x 5.3 cm = volume: 121.1 mL. Cortical echogenicity is normal. No hydronephrosis. Cyst in the lower pole measuring 1.4 cm. Left Kidney: Renal measurements: 9.5 x 5.5 x 5.9 cm = volume: 160.9 mL. Cortical echogenicity is normal. No hydronephrosis. Cyst off the lower pole measuring 2.3 cm. Small echogenic focus at the midpole measuring 5 mm. Bladder: Appears normal for degree of bladder distention. Other: None. IMPRESSION: 1. Negative for hydronephrosis 2. Bilateral renal cysts 3. Possible 5 mm stone left kidney Electronically Signed   By: Donavan Foil M.D.   On: 12/27/2019 19:57   DG CHEST PORT 1 VIEW  Result Date:  12/26/2019 CLINICAL DATA:  Hypoxia. EXAM: PORTABLE CHEST 1 VIEW COMPARISON:  Radiograph yesterday.  CT 06/30/2019 FINDINGS: Cardiomegaly is stable. Unchanged mediastinal contours. Improving interstitial opacities from prior exam. No confluent airspace disease. No pneumothorax.  No large pleural effusion. Degenerative change of both shoulders. IMPRESSION: Stable cardiomegaly. Improving interstitial opacities from prior exam may represent pulmonary edema or infection. No new abnormality. Electronically Signed   By: Keith Rake M.D.   On: 12/26/2019 20:30        Scheduled Meds: . aspirin EC  81 mg Oral Daily  . atorvastatin  80 mg Oral q1800  . buPROPion  300 mg Oral Daily  . chlorhexidine  15 mL Mouth Rinse BID  . escitalopram  20 mg Oral Daily  . gabapentin  200 mg Oral BID  . heparin injection (subcutaneous)  5,000 Units Subcutaneous Q8H  . insulin aspart  0-15 Units Subcutaneous TID WC  . insulin aspart  0-5 Units Subcutaneous QHS  . insulin aspart  3 Units Subcutaneous TID WC  . insulin detemir  35 Units Subcutaneous BID  . mouth rinse  15 mL Mouth Rinse q12n4p  . metoprolol succinate  25 mg Oral QHS  . mometasone-formoterol  2 puff Inhalation BID   And  . umeclidinium bromide  1 puff Inhalation Daily  . pantoprazole  40 mg Oral BID AC  . rOPINIRole  6 mg Oral BID  . sodium chloride flush  3 mL Intravenous Q12H  . tamsulosin  0.4 mg Oral Daily   Continuous Infusions: . sodium chloride       LOS: 3 days    Time spent: 40 minutes    Irine Seal, MD [facility]   To contact the attending provider between 7A-7P or the covering provider during after hours 7P-7A, please log into the web site [email or web address] and access using universal [place] password for that web site. If you do not have the password, please call the hospital operator.  12/28/2019, 11:53 AM

## 2019-12-29 DIAGNOSIS — N179 Acute kidney failure, unspecified: Secondary | ICD-10-CM

## 2019-12-29 DIAGNOSIS — B9620 Unspecified Escherichia coli [E. coli] as the cause of diseases classified elsewhere: Secondary | ICD-10-CM

## 2019-12-29 DIAGNOSIS — I5033 Acute on chronic diastolic (congestive) heart failure: Secondary | ICD-10-CM

## 2019-12-29 DIAGNOSIS — N390 Urinary tract infection, site not specified: Secondary | ICD-10-CM | POA: Clinically undetermined

## 2019-12-29 LAB — CBC WITH DIFFERENTIAL/PLATELET
Abs Immature Granulocytes: 0 10*3/uL (ref 0.00–0.07)
Basophils Absolute: 0 10*3/uL (ref 0.0–0.1)
Basophils Relative: 1 %
Eosinophils Absolute: 0.1 10*3/uL (ref 0.0–0.5)
Eosinophils Relative: 4 %
HCT: 26.4 % — ABNORMAL LOW (ref 36.0–46.0)
Hemoglobin: 8 g/dL — ABNORMAL LOW (ref 12.0–15.0)
Immature Granulocytes: 0 %
Lymphocytes Relative: 28 %
Lymphs Abs: 0.5 10*3/uL — ABNORMAL LOW (ref 0.7–4.0)
MCH: 33.6 pg (ref 26.0–34.0)
MCHC: 30.3 g/dL (ref 30.0–36.0)
MCV: 110.9 fL — ABNORMAL HIGH (ref 80.0–100.0)
Monocytes Absolute: 0.1 10*3/uL (ref 0.1–1.0)
Monocytes Relative: 3 %
Neutro Abs: 1.2 10*3/uL — ABNORMAL LOW (ref 1.7–7.7)
Neutrophils Relative %: 64 %
Platelets: 118 10*3/uL — ABNORMAL LOW (ref 150–400)
RBC: 2.38 MIL/uL — ABNORMAL LOW (ref 3.87–5.11)
RDW: 15.3 % (ref 11.5–15.5)
WBC: 1.8 10*3/uL — ABNORMAL LOW (ref 4.0–10.5)
nRBC: 0 % (ref 0.0–0.2)

## 2019-12-29 LAB — BASIC METABOLIC PANEL
Anion gap: 11 (ref 5–15)
BUN: 31 mg/dL — ABNORMAL HIGH (ref 8–23)
CO2: 26 mmol/L (ref 22–32)
Calcium: 8.7 mg/dL — ABNORMAL LOW (ref 8.9–10.3)
Chloride: 106 mmol/L (ref 98–111)
Creatinine, Ser: 1.53 mg/dL — ABNORMAL HIGH (ref 0.44–1.00)
GFR calc Af Amer: 37 mL/min — ABNORMAL LOW (ref >60)
GFR calc non Af Amer: 32 mL/min — ABNORMAL LOW (ref >60)
Glucose, Bld: 194 mg/dL — ABNORMAL HIGH (ref 70–99)
Potassium: 4.3 mmol/L (ref 3.5–5.1)
Sodium: 143 mmol/L (ref 135–145)

## 2019-12-29 LAB — GLUCOSE, CAPILLARY
Glucose-Capillary: 183 mg/dL — ABNORMAL HIGH (ref 70–99)
Glucose-Capillary: 193 mg/dL — ABNORMAL HIGH (ref 70–99)

## 2019-12-29 MED ORDER — CEPHALEXIN 500 MG PO CAPS: 500.0000 mg | ORAL_CAPSULE | Freq: Every day | ORAL | 0 refills | Status: AC

## 2019-12-29 NOTE — Progress Notes (Signed)
WEnt over discharge papers with patient and family.   All questions answered. VSS.  Pt wheeled out on her portable o2. No HH needs.

## 2019-12-29 NOTE — Discharge Summary (Signed)
Physician Discharge Summary  Janet Choi XBM:[id number] DOB: 04-22-1941 DOA: 12/25/2019  PCP: [name], MD  Admit date: 12/25/2019 Discharge date: 12/29/2019  Time spent: 60 minutes  Recommendations for Outpatient Follow-up:  1. Follow-up with [name], MD in 1 to 2 weeks.  On follow-up Janet Choi need a basic metabolic profile done to follow-up on electrolytes and renal function.  Finalization of urine cultures will need to be followed up upon.  Janet Choi blood pressure also need to be reassessed as Janet Choi's ARB was discontinued on discharge.  Janet Choi also need a CBC with differential done to follow-up on counts. 2. Follow-up with [name], cardiology as previously scheduled. 3. Follow-up with [name], hematology/oncology in 2 to 3 weeks.  On follow-up Janet Choi will need a CBC with differential done to follow-up on counts and further work-up as needed. 4. Follow-up with [name], urology in 1 week.   Discharge Diagnoses:  Principal Problem:   Acute on chronic respiratory failure with hypoxia (HCC) Active Problems:   Hypotension due to hypovolemia   Hyperlipidemia associated with type 2 diabetes mellitus ([place])   Essential hypertension   Morbid obesity due to excess calories (HCC)   RLS (restless legs syndrome)   Type 2 diabetes mellitus with hyperlipidemia (HCC)   OSA and COPD overlap syndrome (HCC)   Acute on chronic diastolic CHF (congestive heart failure) (HCC)   Gastritis and gastroduodenitis   Acute diastolic heart failure (HCC)   AKI (acute kidney injury) ([place])   Obesity, Class III, BMI 40-49.9 (morbid obesity) ([place])   Acute lower UTI   E. 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 who was getting a colonoscopy and endoscopy for pancytopenia at the [facility] long endoscopy suite when she started getting hypoxic, was placed on a BiPAP and her saturations improved, chest x-ray was done that shows pleural effusions cardiomegaly and bilateral interstitial infiltrates, she had a CT scan on 12/06/2019 that showed no effusions and no pulmonary edema so we are consulted for further evaluation and admission, I have asked them to transfer the Janet Choi to the emergency room as we do not have any beds and we have multiple patients waiting at [facility] long ED and she needs a closer care, will continue her on BiPAP.  Hospital Course:  1 acute respiratory failure with hypoxia felt secondary to acute on chronicdiastolic CHF exacerbation Janet Choi noted to be hypoxic requiring BiPAP while getting colonoscopy endoscopy. Janet Choi with history of COPD oxygen dependent chronically on 3 L nasal cannula on ambulation. Volume status difficult to evaluate due to Janet Choi's morbid obesity. Chest x-ray done on admission concerning for pulmonary edema with bilateral pleural effusions compared with CT abdomen and pelvis of 12/06/2019. Janet Choi noted to be on Lasix as needed prior to admission. Janet Choi stated initially was on scheduled Lasix however due to severe cramping was weaned down and currently on Lasix as needed.  Janet Choi improved clinically.  BiPAP subsequently weaned off with sats between 2 to 3 L nasal cannula.  Repeat chest x-ray done with improvement.  Janet Choi was  on IV Lasix 80 mg twice daily which was subsequently discontinued once creatinine started trending up.  Renal ultrasound which was done was negative for hydronephrosis.  Janet Choi gently hydrated.  Diuretics and ARB subsequently discontinued.  Janet Choi improved clinically and was at  baseline by day of discharge.  Outpatient follow-up with pulmonary as previously scheduled.   2. Hypotension Likely secondary to hypovolemia secondary to aggressive diuresis.  Janet Choi remained asymptomatic.  Janet Choi gently hydrated.  Blood pressure improved.  Outpatient follow-up.   3. Hypertension IV Lasix and Cozaar have been discontinued as blood pressure noted to be hypotensive/borderline.  Janet Choi gently hydrated with IV fluids.  Janet Choi maintained on home regimen Toprol-XL.  Outpatient follow-up.  4.  E. coli UTI Day prior to discharge Janet Choi did have some bouts of hematuria as well as complaints of significant dysuria and increased urinary frequency.  Janet Choi noted to have had a small clot noted in the urinary head.  Urinalysis done was concerning for UTI and urine cultures ordered.  Janet Choi placed empirically on IV Rocephin.  Renal ultrasound done on 12/27/2019 was negative for hydronephrosis, possible 5 mm stone in the left kidney, bilateral renal cyst.  Janet Choi improved clinically.  Sensitivities of urine cultures pending by day of discharge.  Janet Choi be discharged home on 3 more days of oral Keflex to complete a 5-day course of antibiotic treatment.  Outpatient follow-up with PCP and urology.  5. Gastritis and gastroduodenitis Per EGD.Janet Choi was maintained on PPI twice daily.   6. COPD oxygen dependent/OSA Janet Choi noted to be on 3 L nasal cannula home O2 on ambulation. Janet Choi oxygenation improved and Janet Choi was down to 2 L nasal cannula by day of discharge.  Janet Choi required BiPAP the night of 12/25/2019,secondary to problem #1. No significant wheezing noted. Fair air movement.  Janet Choi was maintained on Incruse and Dulera during the hospitalization as well as CPAP nightly.  Outpatient follow-up with pulmonary as scheduled.   7. Diverticulosis of the colon without hemorrhage/polyps Status post polypectomy per recent colonoscopy and EGD that showed a small hiatal hernia,  multiple polyps with diverticulosis with no signs of bleeding.  Outpatient follow-up.  8 acute kidney injury Likely secondary to overdiuresis.Creatinine trending back up during the hospitalization. IV Lasix and ARB were subsequently discontinued. Urine sodium of 74, urine creatinine of 156.80. Janet Choi was hydrated gently.  Renal ultrasound done was negative for hydronephrosis however did show bilateral renal cyst, possible 5 mm stone in the left kidney.  Improved post gentle hydration and IV fluids subsequently discontinued.  Creatinine was down to 1.53 by day of discharge.  Outpatient follow-up with PCP and urology.    9. Anemia/iron deficiency anemia Janet Choi with no overt bleeding. Janet Choi underwent EGD and colonoscopy 12/25/2019 showing gastritis and gastroduodenitis, diverticulosis without active bleeding, multiple polyps status post polypectomy.Hemoglobin remained stable during the hospitalization and hemoglobin was 8.0 by day of discharge from 10.2 on admission.  Outpatient follow-up with GI and hematology.    10. Well-controlled diabetes mellitus type 2 Hemoglobin A1c 6.3.  Janet Choi maintained on Levemir 35 units twice daily as well as sliding scale insulin.  Blood glucose remained controlled during the hospitalization.  Outpatient follow-up.   11 restlessleg syndrome Janet Choi maintained on home regimen Requip.  Outpatient follow-up.  12. Hyperlipidemia Janet Choi maintained on statin.  13. Morbid obesity   Procedures:  Upper endoscopy 12/25/2019  Colonoscopy 12/25/2019  Chest x-ray 12/25/2019  Consultations:  None  Discharge Exam: Vitals:   12/29/19 0805 12/29/19 1427  BP:  (!) 131/57  Pulse:  80  Resp:  17  Temp:  98.8 F (37.1 C)  SpO2: 91% 93%    General: NAD Cardiovascular: RRR Respiratory: CTAB  Discharge Instructions   Discharge Instructions    Diet - low sodium heart healthy   Complete by: As directed    Increase activity slowly   Complete  by: As directed    Increase activity slowly   Complete by: As directed      Allergies as of 12/29/2019      Reactions   Brilinta [ticagrelor] Shortness Of Breath, Other (See Comments)   Chest pain (also)   Aspirin Other (See Comments)   Can tolerate in small doses (is already on a blood thinner AND has kidney disease)   Nsaids Other (See Comments)   Janet Choi is taking a blood thinner and has kidney disease   Minocycline Hcl Other (See Comments)   Welts   Oruvail [ketoprofen] Other (See Comments)   Has kidney disease and is taking a blood thinner   Tricor [fenofibrate]    Reaction unknown   Vasotec [enalaprilat]    Reaction unknown   Zinc    Reaction unknown      Medication List    STOP taking these medications   losartan 50 MG tablet Commonly known as: COZAAR     TAKE these medications   acetaminophen 325 MG tablet Commonly known as: TYLENOL Take 2 tablets (650 mg total) by mouth every 4 (four) hours as needed for headache or mild pain.   albuterol 108 (90 Base) MCG/ACT inhaler Commonly known as: Ventolin HFA Inhale 2 puffs into the lungs every 4 (four) hours as needed for wheezing or shortness of breath.   amoxicillin 875 MG tablet Commonly known as: AMOXIL Take 875 mg by mouth 2 (two) times daily.   aspirin 81 MG EC tablet Take 1 tablet (81 mg total) by mouth daily.   atorvastatin 80 MG tablet Commonly known as: LIPITOR TAKE 1 TABLET (80 MG TOTAL) BY MOUTH DAILY AT 6 PM.   Basaglar KwikPen 100 UNIT/ML Inject 70 units Daily for Diabetes What changed:   how much to take  how to take this  when to take this   BD Pen Needle Nano U/F 32G X 4 MM Misc Generic drug: Insulin Pen Needle USE AS DIRECTED 4 TIMES A DAY   BLINK TEARS OP Place 1 drop into both eyes daily as needed (dry eyes).   Breztri Aerosphere 160-9-4.8 MCG/ACT Aero Generic drug: Budeson-Glycopyrrol-Formoterol Inhale 2 puffs into the lungs in the morning and at bedtime.   buPROPion 300 MG  24 hr tablet Commonly known as: WELLBUTRIN XL Take 1 tablet (300 mg total) by mouth daily. Take 1 tablet Daily for Mood What changed: additional instructions   cephALEXin 500 MG capsule Commonly known as: KEFLEX Take 1 capsule (500 mg total) by mouth daily for 3 days. Start taking on: December 30, 2019   [employer] Vit D 5000 High-Potency 125 MCG (5000 UT) capsule Generic drug: Cholecalciferol Take 5,000 Units by mouth 2 (two) times daily.   diclofenac sodium 1 % Gel Commonly known as: VOLTAREN Apply 4 g topically 4 (four) times daily. What changed:   when to take this  reasons to take this   escitalopram 20 MG tablet Commonly known as: Lexapro Take 1 tablet (20 mg total) by mouth daily. What changed: how much to take   ferrous sulfate 325 (65 FE) MG tablet Take 325 mg by mouth 2 (two) times daily with  a meal.   FreeStyle Libre 14 Day Reader Devi 1 Device by Does not apply route every 14 (fourteen) days.   FreeStyle Libre 14 Day Sensor Misc Apply 1 application topically every 14 (fourteen) days.   furosemide 40 MG tablet Commonly known as: LASIX Take 40 mg by mouth daily as needed for fluid.   gabapentin 100 MG capsule Commonly known as: NEURONTIN Take 1 to 2 capsules 3 to 4 x /day as needed for Painful Diabetic Neuropathy What changed:   how much to take  how to take this  when to take this  additional instructions   HYDROcodone-acetaminophen 5-325 MG tablet Commonly known as: NORCO/VICODIN Take 2 tablets by mouth every 4 (four) hours as needed. What changed: reasons to take this   ketoconazole 2 % cream Commonly known as: NIZORAL APPLY TO AFFECTED AREA TWICE A DAY What changed: See the new instructions.   metoprolol succinate 25 MG 24 hr tablet Commonly known as: TOPROL-XL Take 1 tablet (25 mg total) by mouth at bedtime.   nitroGLYCERIN 0.4 MG SL tablet Commonly known as: NITROSTAT Place 1 tablet (0.4 mg total) under the tongue every 5 (five) minutes x 3  doses as needed for chest pain.   NovoLOG FlexPen 100 UNIT/ML FlexPen Generic drug: insulin aspart INJECT 50 TO 75 UNITS 3 X /DAY BEFORE MEALS AS DIRECTED FOR DIABETES What changed: See the new instructions.   onetouch ultrasoft lancets Use as instructed   OneTouch Verio test strip Generic drug: glucose blood USE AS INSTRUCTED TO CHECK SUGAR 2 TIMES DAILY.   pantoprazole 40 MG tablet Commonly known as: Protonix Take 1 tablet (40 mg total) by mouth 2 (two) times daily before a meal.   rOPINIRole 3 MG tablet Commonly known as: REQUIP Take 1 tablet 4 x /day for Restless Legs What changed:   how much to take  how to take this  when to take this  additional instructions   tamsulosin 0.4 MG Caps capsule Commonly known as: Flomax Take 1 capsule (0.4 mg total) by mouth daily.   tiZANidine 4 MG tablet Commonly known as: ZANAFLEX Take 1 tablet at Bedtime as needed for Muscle Spasms What changed:   how much to take  how to take this  when to take this  reasons to take this  additional instructions   triamcinolone 55 MCG/ACT Aero nasal inhaler Commonly known as: NASACORT PLACE 2 SPRAYS INTO THE NOSE AT BEDTIME. What changed:   when to take this  reasons to take this   Victoza 18 MG/3ML Sopn Generic drug: liraglutide Inject 0.2 mLs (1.2 mg total) into the skin every morning.   vitamin B-12 1000 MCG tablet Commonly known as: CYANOCOBALAMIN Take 1,000 mcg by mouth daily.      Allergies  Allergen Reactions  . Brilinta [Ticagrelor] Shortness Of Breath and Other (See Comments)    Chest pain (also)  . Aspirin Other (See Comments)    Can tolerate in small doses (is already on a blood thinner AND has kidney disease)  . Nsaids Other (See Comments)    Janet Choi is taking a blood thinner and has kidney disease  . Minocycline Hcl Other (See Comments)    Welts  . Oruvail [Ketoprofen] Other (See Comments)    Has kidney disease and is taking a blood thinner  .  Tricor [Fenofibrate]     Reaction unknown  . Vasotec [Enalaprilat]     Reaction unknown  . Zinc     Reaction unknown  Follow-up Information    [name], MD. Schedule an appointment as soon as possible for a visit in 1 week(s).   Specialty: Internal Medicine Why: f/u in 1-2 weeks. Contact information: [address] [place] [address] [phone]        Skeet Latch, MD Follow up.   Specialty: Cardiology Why: Follow-up as scheduled. Contact information: [address] [place] [address] [phone]        Brunetta Genera, MD. Schedule an appointment as soon as possible for a visit in 3 week(s).   Specialties: Hematology, Oncology Contact information: [place] [address] [phone]        Irine Seal, MD. Schedule an appointment as soon as possible for a visit in 1 week(s).   Specialty: Urology Contact information: [place] [address] [phone]                The results of significant diagnostics from this hospitalization (including imaging, microbiology, ancillary and laboratory) are listed below for reference.    Significant Diagnostic Studies: CT ABDOMEN PELVIS WO CONTRAST  Result Date: 12/05/2019 CLINICAL DATA:  [age]-year-old female with left lower quadrant abdominal pain. EXAM: CT ABDOMEN AND PELVIS WITHOUT CONTRAST TECHNIQUE: Multidetector CT imaging of the abdomen and pelvis was performed following the standard protocol without IV contrast. COMPARISON:  CT abdomen pelvis dated 04/22/2016. FINDINGS: Evaluation of this exam is limited in the absence of intravenous contrast. Lower chest: Bibasilar linear atelectasis/scarring. There is coronary vascular calcification. There is hypoattenuation of the cardiac blood pool suggestive of anemia. Clinical correlation is recommended. No intra-abdominal free air or free fluid. Hepatobiliary: No focal liver  abnormality is seen. No gallstones, gallbladder wall thickening, or biliary dilatation. Pancreas: Unremarkable. No pancreatic ductal dilatation or surrounding inflammatory changes. Spleen: Normal in size without focal abnormality. Adrenals/Urinary Tract: The adrenal glands unremarkable. There is a punctate nonobstructing left renal upper pole calculus. There are several adjacent stones noted in the distal left ureter with the largest stone measuring approximately 7 mm and overall combined length of approximately 15 mm (coronal 90/3). There is mild left hydronephrosis. A 15 mm hypodense lesion in the inferior pole of the left kidney is not characterized but may represent a cyst. Ultrasound may provide better evaluation. There is left perinephric stranding which may be reactive. Correlation with urinalysis recommended to exclude superimposed UTI. A punctate nonobstructing right renal inferior pole calculus may be present. There is no hydronephrosis or obstructing stone on the right. The right ureter is unremarkable. The urinary bladder is unremarkable as well. Stomach/Bowel: There is extensive sigmoid diverticulosis without active inflammatory changes. Stranding and fluid adjacent to the sigmoid colon represent inferior extension of left perinephric stranding along the paracolic gutter. There is a small hiatal hernia. There is no bowel obstruction. Appendectomy. Vascular/Lymphatic: Mild aortoiliac atherosclerotic disease. The IVC is unremarkable. No portal venous gas. There is no adenopathy. Reproductive: The uterus is grossly unremarkable. Other: None Musculoskeletal: Osteopenia with extensive degenerative changes of the spine. No acute osseous pathology. IMPRESSION: 1. Several adjacent stones in the distal left ureter with mild left hydronephrosis. Correlation with urinalysis recommended to exclude superimposed UTI. 2. Extensive sigmoid diverticulosis. No bowel obstruction. 3. Aortic Atherosclerosis (ICD10-I70.0).  Electronically Signed   By: Anner Crete M.D.   On: 12/05/2019 20:02   US RENAL  Result Date: 12/27/2019 CLINICAL DATA:  Acute renal failure EXAM: RENAL / URINARY TRACT ULTRASOUND COMPLETE COMPARISON:  CT 12/05/2019, 10/17/2019 FINDINGS: Right Kidney:  Renal measurements: 7.9 x 5.5 x 5.3 cm = volume: 121.1 mL. Cortical echogenicity is normal. No hydronephrosis. Cyst in the lower pole measuring 1.4 cm. Left Kidney: Renal measurements: 9.5 x 5.5 x 5.9 cm = volume: 160.9 mL. Cortical echogenicity is normal. No hydronephrosis. Cyst off the lower pole measuring 2.3 cm. Small echogenic focus at the midpole measuring 5 mm. Bladder: Appears normal for degree of bladder distention. Other: None. IMPRESSION: 1. Negative for hydronephrosis 2. Bilateral renal cysts 3. Possible 5 mm stone left kidney Electronically Signed   By: Donavan Foil M.D.   On: 12/27/2019 19:57   DG CHEST PORT 1 VIEW  Result Date: 12/26/2019 CLINICAL DATA:  Hypoxia. EXAM: PORTABLE CHEST 1 VIEW COMPARISON:  Radiograph yesterday.  CT 06/30/2019 FINDINGS: Cardiomegaly is stable. Unchanged mediastinal contours. Improving interstitial opacities from prior exam. No confluent airspace disease. No pneumothorax. No large pleural effusion. Degenerative change of both shoulders. IMPRESSION: Stable cardiomegaly. Improving interstitial opacities from prior exam may represent pulmonary edema or infection. No new abnormality. Electronically Signed   By: Keith Rake M.D.   On: 12/26/2019 20:30   DG CHEST PORT 1 VIEW  Result Date: 12/25/2019 CLINICAL DATA:  Shortness of breath. EXAM: PORTABLE CHEST 1 VIEW COMPARISON:  CT 08/28/2019.  Chest x-ray 07/24/2019. FINDINGS: Cardiomegaly. Diffuse bilateral pulmonary infiltrates/edema. Small bilateral pleural effusions. No pneumothorax. Thoracic spine scoliosis and degenerative change. IMPRESSION: 1.  Cardiomegaly. 2. Diffuse bilateral pulmonary infiltrates/edema. Small bilateral pleural effusions.  Electronically Signed   By: Marcello Moores  Register   On: 12/25/2019 13:26   SLEEP STUDY DOCUMENTS  Result Date: 12/19/2019 Ordered by an unspecified provider.   Microbiology: Recent Results (from the past 240 hour(s))  SARS CORONAVIRUS 2 (TAT 6-24 HRS) Nasopharyngeal Nasopharyngeal Swab     Status: None   Collection Time: 12/21/19  1:52 PM   Specimen: Nasopharyngeal Swab  Result Value Ref Range Status   SARS Coronavirus 2 NEGATIVE NEGATIVE Final    Comment: (NOTE) SARS-CoV-2 target nucleic acids are NOT DETECTED.  The SARS-CoV-2 RNA is generally detectable in upper and lower respiratory specimens during the acute phase of infection. Negative results do not preclude SARS-CoV-2 infection, do not rule out co-infections with other pathogens, and should not be used as the sole basis for treatment or other Janet Choi management decisions. Negative results must be combined with clinical observations, Janet Choi history, and epidemiological information. The expected result is Negative.  Fact Sheet for Patients: SugarRoll.be  Fact Sheet for Healthcare Providers: https://www.woods-mathews.com/  This test is not yet approved or cleared by the [place] FDA and  has been authorized for detection and/or diagnosis of SARS-CoV-2 by FDA under an Emergency Use Authorization (EUA). This EUA will remain  in effect (meaning this test can be used) for the duration of the COVID-19 declaration under Se ction 564(b)(1) of the Act, 21 U.S.C. section 360bbb-3(b)(1), unless the authorization is terminated or revoked sooner.  Performed at [facility], [place] [address], [place], [place] [address]   SARS Coronavirus 2 by RT PCR (hospital order, performed in [facility] hospital lab) Nasopharyngeal Nasopharyngeal Swab     Status: None   Collection Time: 12/26/19  1:17 PM   Specimen: Nasopharyngeal Swab  Result Value Ref Range Status   SARS Coronavirus 2 NEGATIVE  NEGATIVE Final    Comment: (NOTE) SARS-CoV-2 target nucleic acids are NOT DETECTED.  The SARS-CoV-2 RNA is generally detectable in upper and lower respiratory specimens during the acute phase of infection. The lowest concentration of SARS-CoV-2  viral copies this assay can detect is 250 copies / mL. A negative result does not preclude SARS-CoV-2 infection and should not be used as the sole basis for treatment or other Janet Choi management decisions.  A negative result may occur with improper specimen collection / handling, submission of specimen other than nasopharyngeal swab, presence of viral mutation(s) within the areas targeted by this assay, and inadequate number of viral copies (<250 copies / mL). A negative result must be combined with clinical observations, Janet Choi history, and epidemiological information.  Fact Sheet for Patients:   StrictlyIdeas.no  Fact Sheet for Healthcare Providers: BankingDealers.co.za  This test is not yet approved or  cleared by the [place] FDA and has been authorized for detection and/or diagnosis of SARS-CoV-2 by FDA under an Emergency Use Authorization (EUA).  This EUA will remain in effect (meaning this test can be used) for the duration of the COVID-19 declaration under Section 564(b)(1) of the Act, 21 U.S.C. section 360bbb-3(b)(1), unless the authorization is terminated or revoked sooner.  Performed at [facility], [place] [address], [place], [place] [address]   Culture, Urine     Status: None   Collection Time: 12/27/19  8:14 AM   Specimen: Urine, Catheterized  Result Value Ref Range Status   Specimen Description   Final    URINE, CATHETERIZED Performed at [place] [address], [place], [place] [address]    Special Requests   Final    NONE Performed at [facility], [place] [address], [place], [place] [address]    Culture    Final    NO GROWTH Performed at [facility], [place] [address] [place], [place] [address]    Report Status 12/28/2019 FINAL  Final  Culture, Urine     Status: Abnormal (Preliminary result)   Collection Time: 12/28/19 11:53 AM   Specimen: Urine, Clean Catch  Result Value Ref Range Status   Specimen Description   Final    Urine Performed at [place] [address], [place], [place] [address]    Special Requests   Final    NONE Performed at [facility], [place] [address], [place], [place] [address]    Culture (A)  Final    >=100,000 COLONIES/mL ESCHERICHIA COLI SUSCEPTIBILITIES TO FOLLOW Performed at [facility], [place] [address], [place], [place] [address]    Report Status PENDING  Incomplete     Labs: Basic Metabolic Panel: Recent Labs  Lab 12/25/19 1328 12/25/19 1328 12/25/19 2120 12/26/19 0550 12/27/19 0536 12/28/19 0510 12/29/19 0558  NA 139  --   --  140 137 141 143  K 4.5  --   --  4.0 3.6 4.1 4.3  CL 102  --   --  100 101 103 106  CO2  --   --   --  28 27 28 26   GLUCOSE 177*  --   --  244* 216* 177* 194*  BUN 18  --   --  26* 37* 37* 31*  CREATININE 1.30*   < > 1.56* 1.96* 2.06* 1.76* 1.53*  CALCIUM  --   --   --  8.8* 8.4* 8.6* 8.7*   < > = values in this interval not displayed.   Liver Function Tests: No results for input(s): AST, ALT, ALKPHOS, BILITOT, PROT, ALBUMIN in the last 168 hours. No results for input(s): LIPASE, AMYLASE in the last 168 hours. No results for input(s): AMMONIA  in the last 168 hours. CBC: Recent Labs  Lab 12/25/19 2120 12/26/19 0550 12/27/19 0536 12/28/19 0510 12/29/19 0838  WBC 2.3* 2.7* 1.9* 1.7* 1.8*  NEUTROABS  --   --  1.3*  --  1.2*  HGB 10.2* 8.9* 7.8* 7.6* 8.0*  HCT 32.3* 28.5* 25.3* 24.9* 26.4*  MCV 108.4* 108.0* 109.1* 110.2* 110.9*  PLT 130* 113* 100* 108* 118*   Cardiac Enzymes: No results for input(s): CKTOTAL, CKMB, CKMBINDEX, TROPONINI in the last 168  hours. BNP: BNP (last 3 results) Recent Labs    07/24/19 1521  BNP 37.1    ProBNP (last 3 results) No results for input(s): PROBNP in the last 8760 hours.  CBG: Recent Labs  Lab 12/28/19 1148 12/28/19 1729 12/28/19 2249 12/29/19 0752 12/29/19 1154  GLUCAP 174* 227* 142* 183* 193*       Signed:  Irine Seal MD.  [facility] 12/29/2019, 2:47 PM

## 2019-12-30 LAB — URINE CULTURE: Culture: >=100000 — AB

## 2020-01-01 ENCOUNTER — Telehealth: Payer: Self-pay | Admitting: *Deleted

## 2020-01-01 ENCOUNTER — Other Ambulatory Visit (HOSPITAL_COMMUNITY): Payer: PPO

## 2020-01-01 NOTE — Telephone Encounter (Addendum)
Called patient on 01/01/2020 , 11:58 AM in an attempt to reach the patient for a hospital follow up.   Admit date: 12/25/19 Discharge: 12/29/19   She does not have any questions or concerns about medications from the hospital admission. The patient's medications were reviewed over the phone, they were counseled to bring in all current medications to the hospital follow up visit. She states she continues to have diarrhea and will try OTC Imodium.  I advised the patient to call if any questions or concerns arise about the hospital admission or medications. She will finish Keflex today.    Home health was not started in the hospital. Patient continues to be on O2 continuous.  All questions were answered and a follow up appointment was made. She has a follow up on 01/08/2020 with Garnet Sierras, NP.  Prior to Admission medications   Medication Sig Start Date End Date Taking? Authorizing Provider  acetaminophen (TYLENOL) 325 MG tablet Take 2 tablets (650 mg total) by mouth every 4 (four) hours as needed for headache or mild pain. 04/20/16   Isaiah Serge, NP  albuterol (VENTOLIN HFA) 108 (90 Base) MCG/ACT inhaler Inhale 2 puffs into the lungs every 4 (four) hours as needed for wheezing or shortness of breath. 09/07/19   Vicie Mutters, PA-C  amoxicillin (AMOXIL) 875 MG tablet Take 875 mg by mouth 2 (two) times daily.  12/22/19   [provider]  aspirin EC 81 MG EC tablet Take 1 tablet (81 mg total) by mouth daily. 04/21/16   Isaiah Serge, NP  atorvastatin (LIPITOR) 80 MG tablet TAKE 1 TABLET (80 MG TOTAL) BY MOUTH DAILY AT 6 PM. 11/23/19   Skeet Latch, MD  Budeson-Glycopyrrol-Formoterol (BREZTRI AEROSPHERE) 160-9-4.8 MCG/ACT AERO Inhale 2 puffs into the lungs in the morning and at bedtime. 10/15/19   Collene Gobble, MD  buPROPion (WELLBUTRIN XL) 300 MG 24 hr tablet Take 1 tablet (300 mg total) by mouth daily. Take 1 tablet Daily for Mood Patient taking differently: Take 300 mg by  mouth daily.  10/02/19   Vicie Mutters, PA-C  cephALEXin (KEFLEX) 500 MG capsule Take 1 capsule (500 mg total) by mouth daily for 3 days. 12/30/19 01/02/20  Eugenie Filler, MD  Cholecalciferol ([employer] VIT D 5000 HIGH-POTENCY) 5000 units capsule Take 5,000 Units by mouth 2 (two) times daily.     [provider]  Continuous Blood Gluc Receiver (FREESTYLE LIBRE 14 DAY READER) DEVI 1 Device by Does not apply route every 14 (fourteen) days. 09/07/19   Vicie Mutters, PA-C  Continuous Blood Gluc Sensor (FREESTYLE LIBRE 14 DAY SENSOR) MISC Apply 1 application topically every 14 (fourteen) days. 09/07/19   Vicie Mutters, PA-C  diclofenac sodium (VOLTAREN) 1 % GEL Apply 4 g topically 4 (four) times daily. Patient taking differently: Apply 4 g topically 4 (four) times daily as needed (pain).  01/18/18   Vicie Mutters, PA-C  escitalopram (LEXAPRO) 20 MG tablet Take 1 tablet (20 mg total) by mouth daily. Patient taking differently: Take 40 mg by mouth daily.  07/02/19 07/01/20  Vicie Mutters, PA-C  ferrous sulfate 325 (65 FE) MG tablet Take 325 mg by mouth 2 (two) times daily with a meal.     [provider]  furosemide (LASIX) 40 MG tablet Take 40 mg by mouth daily as needed for fluid.     [provider]  gabapentin (NEURONTIN) 100 MG capsule Take 1 to 2 capsules 3 to 4 x /day as needed for Painful  Diabetic Neuropathy Patient taking differently: Take 200 mg by mouth 2 (two) times daily.  01/01/19   [name], MD  HYDROcodone-acetaminophen (NORCO/VICODIN) 5-325 MG tablet Take 2 tablets by mouth every 4 (four) hours as needed. Patient taking differently: Take 2 tablets by mouth every 4 (four) hours as needed for moderate pain.  12/05/19   Garald Balding, PA-C  Insulin Glargine (BASAGLAR KWIKPEN) 100 UNIT/ML SOPN Inject 70 units Daily for Diabetes Patient taking differently: Inject 70 Units into the skin at bedtime. Inject 70 units Daily for Diabetes 04/30/19   [name],  MD  Insulin Pen Needle (BD PEN NEEDLE NANO U/F) 32G X 4 MM MISC USE AS DIRECTED 4 TIMES A DAY 02/22/19   [name], MD  ketoconazole (NIZORAL) 2 % cream APPLY TO AFFECTED AREA TWICE A DAY Patient taking differently: Apply 1 application topically daily as needed (itching).  11/16/19   [name], MD  Lancets [facility] ULTRASOFT) lancets Use as instructed 04/27/16   Philemon Kingdom, MD  liraglutide (VICTOZA) 18 MG/3ML SOPN Inject 0.2 mLs (1.2 mg total) into the skin every morning. 07/30/19   Vicie Mutters, PA-C  metoprolol succinate (TOPROL-XL) 25 MG 24 hr tablet Take 1 tablet (25 mg total) by mouth at bedtime. 07/25/19   [facility], Tami Lin, PA  nitroGLYCERIN (NITROSTAT) 0.4 MG SL tablet Place 1 tablet (0.4 mg total) under the tongue every 5 (five) minutes x 3 doses as needed for chest pain. 10/04/17   [name], MD  NOVOLOG FLEXPEN 100 UNIT/ML FlexPen INJECT 50 TO 75 UNITS 3 X /DAY BEFORE MEALS AS DIRECTED FOR DIABETES Patient taking differently: Inject 20-25 Units into the skin 3 (three) times daily before meals. BS<150 Take 20 units BS>150 Take 25 units 11/16/19   [name], MD  [facility] VERIO test strip USE AS INSTRUCTED TO CHECK SUGAR 2 TIMES DAILY. 09/21/17   Philemon Kingdom, MD  pantoprazole (PROTONIX) 40 MG tablet Take 1 tablet (40 mg total) by mouth 2 (two) times daily before a meal. 10/15/19 10/14/20  Vicie Mutters, PA-C  Polyethylene Glycol 400 (BLINK TEARS OP) Place 1 drop into both eyes daily as needed (dry eyes).     [provider]  rOPINIRole (REQUIP) 3 MG tablet Take 1 tablet 4 x /day for Restless Legs Patient taking differently: Take 6 mg by mouth in the morning and at bedtime.  03/25/19   [name], MD  tamsulosin (FLOMAX) 0.4 MG CAPS capsule Take 1 capsule (0.4 mg total) by mouth daily. Patient not taking: Reported on 12/25/2019 12/05/19   Garald Balding, PA-C  tiZANidine (ZANAFLEX) 4 MG tablet Take 1 tablet at Bedtime as needed for Muscle  Spasms Patient taking differently: Take 4 mg by mouth at bedtime as needed for muscle spasms.  08/21/19   [name], MD  triamcinolone (NASACORT) 55 MCG/ACT AERO nasal inhaler PLACE 2 SPRAYS INTO THE NOSE AT BEDTIME. Patient taking differently: Place 2 sprays into the nose at bedtime as needed (allergies).  11/16/19 11/15/20  [name], MD  vitamin B-12 (CYANOCOBALAMIN) 1000 MCG tablet Take 1,000 mcg by mouth daily.    [provider]

## 2020-01-02 ENCOUNTER — Telehealth: Payer: Self-pay | Admitting: Emergency Medicine

## 2020-01-02 DIAGNOSIS — G4733 Obstructive sleep apnea (adult) (pediatric): Secondary | ICD-10-CM

## 2020-01-03 ENCOUNTER — Encounter: Payer: Self-pay | Admitting: Emergency Medicine

## 2020-01-03 ENCOUNTER — Ambulatory Visit (INDEPENDENT_AMBULATORY_CARE_PROVIDER_SITE_OTHER): Payer: PPO | Admitting: Emergency Medicine

## 2020-01-03 ENCOUNTER — Other Ambulatory Visit: Payer: Self-pay

## 2020-01-03 DIAGNOSIS — J449 Chronic obstructive pulmonary disease, unspecified: Secondary | ICD-10-CM

## 2020-01-03 DIAGNOSIS — G4733 Obstructive sleep apnea (adult) (pediatric): Secondary | ICD-10-CM | POA: Diagnosis not present

## 2020-01-03 DIAGNOSIS — R911 Solitary pulmonary nodule: Secondary | ICD-10-CM

## 2020-01-03 NOTE — Assessment & Plan Note (Signed)
We will order Bi-PAP for you to use each evening based on your recent sleep study We will perform walking oximetry on pulsed flow oxygen today so that we can order a portable oxygen concentrator.  Please continue 2 L/min when you are using continuous flow oxygen. Please continue Breztri 2 puffs twice a day.  Rinse and gargle after using. Keep albuterol available use 2 puffs if needed for shortness of breath, chest tightness, wheezing. Follow with Dr Lamonte Sakai in October after your Ct scan to review.

## 2020-01-03 NOTE — Telephone Encounter (Signed)
Called the pt and she was actually in the office for appt with RB  This can be discussed at her appt  Will go ahead and order CPAP since pt already anticipating this to be done

## 2020-01-03 NOTE — Telephone Encounter (Signed)
Tried calling the pt and there was no answer- LMTCB  Looks like Dr Lamonte Sakai wanted a CPAP titration done first and then was going to order new CPAP  Will go ahead and forward to him to see if he wants to review the results of titration and advise recs on CPAP

## 2020-01-03 NOTE — Telephone Encounter (Signed)
Sorry for the confusion - I'm going to have to change the CPAP order to BiPAP. We will take care of this during the OV

## 2020-01-03 NOTE — Telephone Encounter (Signed)
The split-night study did not give Janet Choi any pressures that she did not qualify for CPAP placement overnight.  Based on that please order new CPAP for her, auto titration setting, range 8-20 cmH2O.  Have her get the best fit mask, heated humidity.

## 2020-01-03 NOTE — Assessment & Plan Note (Signed)
We will plan to repeat your CT chest in 02/2020 to follow your pulmonary nodule

## 2020-01-03 NOTE — Patient Instructions (Addendum)
We will order Bi-PAP for you to use each evening based on your recent sleep study We will perform walking oximetry on pulsed flow oxygen today so that we can order a portable oxygen concentrator.  Please continue 2 L/min when you are using continuous flow oxygen. Please continue Breztri 2 puffs twice a day.  Rinse and gargle after using. Keep albuterol available use 2 puffs if needed for shortness of breath, chest tightness, wheezing. We will plan to repeat your CT chest in 02/2020 to follow your pulmonary nodule Follow with Dr Lamonte Sakai in October after your Ct scan to review.

## 2020-01-03 NOTE — Addendum Note (Signed)
Addended by: Gavin Potters R on: 01/03/2020 03:43 PM   Modules accepted: Orders

## 2020-01-03 NOTE — Progress Notes (Signed)
Subjective:    Patient ID: Janet Choi, female    DOB: [birth year], 79 y.o.   MRN: [id number]  HPI 79 year old woman, former smoker (33 pack years) with a history of CAD and systolic CHF, arthritis, chronic kidney disease, hypertension, diabetes mellitus, OSA (not on CPAP), restless leg syndrome.  Also under evaluation for anemia, pancytopenia, about to see hematology.  She is referred for dyspnea.   She reports exertional dyspnea, some ups and downs. Difficulty walking through her house. She describes a tightness in her chest that can be present at rest or when active. The tightness is associated with dyspnea. Does minimal housework, difficulty to go shopping. She was having cough in May, was treated with pred taper and azithro >> flares about 2x a year. She has albuterol, and was started on breztri in May 2021. She isn't sure whether they are helping her much. Denies any CP.   She is about to have a repeat PSG, is willing to retry CPAP.   Spirometry 01/23/2018 reviewed by me, shows grossly normal airflows.  She underwent a CT chest 08/28/19 that I have reviewed, shows a 52mm LLL nodule not seen on scan in 2017.   ROV 12/06/19 --Janet Choi returns today for further evaluation of her tobacco use, obstructive lung disease.  She is 38, over 60-pack-year history of tobacco, with coronary disease and systolic CHF, CKD, severe OSA not on CPAP. Also with microcytic anemia / pancytopenia, receiving Fe infusions.  She is set up for CSY and EGD.  She was started on [place] after an apparent COPD exacerbation in May, appears to be benefiting some.  She describes "ups and downs". She is having difficult time walking any distance. Occasional cough, no sputum. Using albuterol about 2x a day - helps her. She was in the ED 7/28 with a renal stone. She is set for a CPAP titration study in early August. Last TTE was in 2017.   She has a pulmonary nodule with planned follow up CT  in October 2021, no ILD on that CT.    Pulmonary function testing done on 11/22/2019 reviewed by me, showed grossly normal airflows without a bronchodilator response, some curve to her flow volume loop is suggestive of mild obstruction.  Her lung volumes are normal.  Diffusion capacity is decreased and does not correct for alveolar volume.  ROV 01/03/20 --follow-up visit 79 year old woman with a history of obstructive lung disease, CAD with systolic CHF, CKD, severe OSA, microcytic anemia. She has a pulm nodule in LLL identified 08/2019.  She is being managed on Breztri since May 2021.  We performed a split-night sleep study 10/31/2019 that confirmed severe OSA but did not qualify for CPAP titration.  She was since admitted to [facility] long after she was hypoxic following sedation for colonoscopy and endoscopy.  She temporarily required BiPAP.  She was found to have an E. coli urinary tract infection during that hospitalization.  She was discharged on 2 L/min oxygen   Review of Systems As per HPI     Objective:   Physical Exam Vitals:   01/03/20 1406  BP: 116/64  Pulse: 74  Temp: (!) 97.4 F (36.3 C)  TempSrc: Temporal  SpO2: 91%  Weight: 241 lb 12.8 oz (109.7 kg)  Height: 5\' 1"  (1.549 m)   Gen: Pleasant, obese, in no distress,  normal affect. Comfortable at rest  ENT: No lesions,  mouth clear,  oropharynx clear but crowded, M4 airway, no postnasal drip  Neck: No JVD,  no stridor  Lungs: No use of accessory muscles, small breaths, no crackles or wheezing on normal respiration, no wheeze on forced expiration  Cardiovascular: RRR, heart sounds normal, soft systolic murmur, trace bilateral pretibial peripheral edema  Musculoskeletal: No deformities, no cyanosis or clubbing  Neuro: alert, awake, non focal  Skin: Warm, no lesions or rash     Assessment & Plan:  OSA and COPD overlap syndrome (HCC) We will order Bi-PAP for you to use each evening based on your recent sleep study We will perform walking oximetry on pulsed  flow oxygen today so that we can order a portable oxygen concentrator.  Please continue 2 L/min when you are using continuous flow oxygen. Please continue Breztri 2 puffs twice a day.  Rinse and gargle after using. Keep albuterol available use 2 puffs if needed for shortness of breath, chest tightness, wheezing. Follow with Dr Lamonte Sakai in October after your Ct scan to review.  Solitary pulmonary nodule We will plan to repeat your CT chest in 02/2020 to follow your pulmonary nodule  Janet Apo, MD, PhD 01/03/2020, 2:25 PM [facility] [phone] or if no answer [phone]

## 2020-01-07 ENCOUNTER — Other Ambulatory Visit: Payer: Self-pay

## 2020-01-07 ENCOUNTER — Ambulatory Visit (HOSPITAL_COMMUNITY): Payer: PPO | Attending: Cardiology

## 2020-01-07 DIAGNOSIS — I272 Pulmonary hypertension, unspecified: Secondary | ICD-10-CM | POA: Diagnosis not present

## 2020-01-07 LAB — ECHOCARDIOGRAM COMPLETE
Area-P 1/2: 2.5 cm2
S' Lateral: 2.5 cm

## 2020-01-08 ENCOUNTER — Ambulatory Visit (INDEPENDENT_AMBULATORY_CARE_PROVIDER_SITE_OTHER): Payer: PPO | Admitting: Adult Health Nurse Practitioner

## 2020-01-08 ENCOUNTER — Other Ambulatory Visit: Payer: Self-pay

## 2020-01-08 ENCOUNTER — Encounter: Payer: Self-pay | Admitting: Adult Health Nurse Practitioner

## 2020-01-08 VITALS — BP 118/64 | HR 73 | Temp 98.1°F | Wt 242.0 lb

## 2020-01-08 DIAGNOSIS — N1831 Chronic kidney disease, stage 3a: Secondary | ICD-10-CM

## 2020-01-08 DIAGNOSIS — I9589 Other hypotension: Secondary | ICD-10-CM

## 2020-01-08 DIAGNOSIS — I5031 Acute diastolic (congestive) heart failure: Secondary | ICD-10-CM

## 2020-01-08 DIAGNOSIS — G4733 Obstructive sleep apnea (adult) (pediatric): Secondary | ICD-10-CM | POA: Diagnosis not present

## 2020-01-08 DIAGNOSIS — J9621 Acute and chronic respiratory failure with hypoxia: Secondary | ICD-10-CM | POA: Diagnosis not present

## 2020-01-08 DIAGNOSIS — F419 Anxiety disorder, unspecified: Secondary | ICD-10-CM

## 2020-01-08 DIAGNOSIS — E1169 Type 2 diabetes mellitus with other specified complication: Secondary | ICD-10-CM | POA: Diagnosis not present

## 2020-01-08 DIAGNOSIS — K2990 Gastroduodenitis, unspecified, without bleeding: Secondary | ICD-10-CM

## 2020-01-08 DIAGNOSIS — N390 Urinary tract infection, site not specified: Secondary | ICD-10-CM

## 2020-01-08 DIAGNOSIS — I10 Essential (primary) hypertension: Secondary | ICD-10-CM | POA: Diagnosis not present

## 2020-01-08 DIAGNOSIS — G2581 Restless legs syndrome: Secondary | ICD-10-CM

## 2020-01-08 DIAGNOSIS — D631 Anemia in chronic kidney disease: Secondary | ICD-10-CM

## 2020-01-08 DIAGNOSIS — J449 Chronic obstructive pulmonary disease, unspecified: Secondary | ICD-10-CM

## 2020-01-08 DIAGNOSIS — I5033 Acute on chronic diastolic (congestive) heart failure: Secondary | ICD-10-CM

## 2020-01-08 DIAGNOSIS — K2970 Gastritis, unspecified, without bleeding: Secondary | ICD-10-CM

## 2020-01-08 DIAGNOSIS — N179 Acute kidney failure, unspecified: Secondary | ICD-10-CM | POA: Diagnosis not present

## 2020-01-08 DIAGNOSIS — Z79899 Other long term (current) drug therapy: Secondary | ICD-10-CM

## 2020-01-08 DIAGNOSIS — E785 Hyperlipidemia, unspecified: Secondary | ICD-10-CM

## 2020-01-08 DIAGNOSIS — E861 Hypovolemia: Secondary | ICD-10-CM

## 2020-01-08 DIAGNOSIS — B9620 Unspecified Escherichia coli [E. coli] as the cause of diseases classified elsewhere: Secondary | ICD-10-CM

## 2020-01-08 MED ORDER — ESCITALOPRAM OXALATE 20 MG PO TABS: 20.0000 mg | ORAL_TABLET | Freq: Every day | ORAL | 3 refills | Status: DC

## 2020-01-08 NOTE — Progress Notes (Signed)
Hospital follow up  Assessment and Plan: Hospital visit follow up for hypoxia.  Janet Choi was seen today for hospitalization follow-up.  Diagnoses and all orders for this visit:  Acute on chronic respiratory failure with hypoxia ([place]) Improved since admission Continuous 02, 2L [place], continue with benefit. -     CBC with Differential/Platelet  Hypotension due to hypovolemia 118/64 today Continue Toprol-XL for rate D/C Cozaar Taking Lasix 40mg  daily Monitoring B/P and weight  Hyperlipidemia associated with type 2 diabetes mellitus (HCC) Continue medications: Atorvastatin 80mg  Discussed dietary and exercise modifications Low fat diet  Essential hypertension Continue current medications, follow up with cardiology Monitor blood pressure at home; call if consistently over 130/80 or 100/60 or less contact office Continue DASH diet.   Reminder to go to the ER if any CP, SOB, nausea, dizziness, severe HA, changes vision/speech, left arm numbness and tingling and jaw pain.  -     CBC with Differential/Platelet -     COMPLETE METABOLIC PANEL WITH GFR  Morbid obesity due to excess calories (HCC) Discussed dietary and exercise modifications  RLS (restless legs syndrome) Doing well at this time  Type 2 diabetes mellitus with hyperlipidemia (HCC) Continue medications: Continue Victoza, Novolog 20units with breakfast and 25 units with dinner, Basaglar 70units at bedtime.  Discussed scheduling this since patient does not go to sleep at same time. Discussed general issues about diabetes pathophysiology and management. Education: Reviewed 'ABCs' of diabetes management (respective goals in parentheses):  A1C (<7), blood pressure (<130/80), and cholesterol (LDL <70) Dietary recommendations Encouraged aerobic exercise.  Discussed foot care, check daily Yearly retinal exam Dental exam every 6 months Monitor blood glucose, discussed goal for patient   OSA and COPD overlap syndrome ([place]) Needs  to get CPAP supplies to assist with insomnia.  Acute on chronic diastolic CHF (congestive heart failure) (HCC) CHF Disease process and medications discussed. Questions answered fully. Emphasized salt restriction, less than 2000mg  a day. Encouraged daily monitoring of the patient's weight, call office if 5 lb weight loss or gain in a day.  Encouraged regular exercise. If any increasing shortness of breath, swelling, or chest pressure go to ER immediately.  decrease your fluid intake to less than 2 L daily please remember to always increase your potassium intake with any increase of your fluid pill.   Gastritis and gastroduodenitis Doing well at this   Acute diastolic heart failure (HCC) CHF Disease process and medications discussed. Questions answered fully. Emphasized salt restriction, less than 2000mg  a day. Encouraged daily monitoring of the patient's weight, call office if 5 lb weight loss or gain in a day.  Encouraged regular exercise. If any increasing shortness of breath, swelling, or chest pressure go to ER immediately.  decrease your fluid intake to less than 2 L daily please remember to always increase your potassium intake with any increase of your fluid pill.   AKI (acute kidney injury) ([place]) -CMP Continue to monitor'  Severe obesity (BMI 35.0-39.9) with comorbidity ([place]) Discussed dietary and exercise modifications  Acute lower UTI / E. 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  Other orders -     REFLEXIVE URINE  CULTURE     All medications were reviewed with patient and family and fully reconciled. All questions answered fully, and patient and family members were encouraged to call the office with any further questions or concerns. Discussed goal to avoid readmission related to this diagnosis.  There are no discontinued medications.  Over 40 minutes of face to face interview, exam, counseling, chart review, and complex, high/moderate level critical decision making was performed this visit.   Future Appointments  Date Time Provider [place]  02/04/2020  9:15 AM Collene Gobble, MD [facility] None  02/08/2020 10:30 AM [facility] None  02/08/2020 11:00 AM Brunetta Genera, MD [facility] None  02/12/2020  2:30 PM Vicie Mutters, PA-C [facility] None  05/14/2020 11:00 AM Vicie Mutters, PA-C [facility] None  09/15/2020 11:15 AM Vicie Mutters, PA-C [facility] None     HPI 79 y.o.female presents for follow up for transition from recent hospitalization or SNIF stay. Admit date to the hospital was 12/25/19, patient was discharged from the hospital on 12/29/19 and our clinical staff contacted the office the day after discharge to set up a follow up appointment. The discharge summary, medications, and diagnostic test results were reviewed before meeting with the patient. The patient was admitted for: Acute on chronic respiratory failure  She was have colonoscopy and endoscopy for pancytopenia at [facility] when she became hypoxic and placed on BiPAP.  chest x-ray was done that shows pleural effusions cardiomegaly and bilateral interstitial infiltrates, she had a CT scan on 12/06/2019 that showed no effusions and no pulmonary edema. She has follow up schedule with Cardiology Dr. Oval Linsey. Last Echo 01/07/20 EF 60-65%. Hematology/oncology, Dr [name] on 02/08/20  Urology, Dr Roni Bread, another kidney stone was found on CT, asymptomatic.  Pulmonology Dr Lamonte Sakai on 01/03/20.  Plan to repeat CT  on 02/2020.  Continue Breztri and 2L 02  Since hospital admission she continues to feel fatigued.  She is wearing 2L portable 02.  Today she reports she is doing much better since hospitalization.   She has history of DMII.  She reports her last bloog glucose was 155.    From Hospital admission: HPI: Janet Choi is a 79 y.o. 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 who was getting a colonoscopy and endoscopy for pancytopenia at the [facility] long endoscopy suite when she started getting hypoxic, was placed on a BiPAP and her saturations improved, chest x-ray was done that shows pleural effusions cardiomegaly and bilateral interstitial infiltrates, she had a CT scan on 12/06/2019 that showed no effusions and no pulmonary edema so we are consulted for further evaluation and admission, I have asked them to transfer the patient to the emergency room as we do not have any beds and we have multiple patients waiting at [facility] at [place] long ED and she needs a closer care, will continue her on BiPAP.  Assessment/Plan Acute respiratory failure with hypoxia due to acute pulmonary edema secondary to acute diastolic heart failure: Her volume status is hard to evaluate as she is morbidly obese, she does have a chest x-ray that shows pulmonary edema and bilateral pleural effusions compared from a CT scan of the abdomen pelvis on 12/06/2019 that showed no pleural  effusions and no pulmonary edema dizziness most likely cause, she relates she has not been taking her Lasix regularly. We will place her on BiPAP we will start her on IV Lasix twice a day monitor electrolytes and replete as needed. We will keep her on a low-sodium diet restrict her fluids Daily weights strict I's and O's. We will need to check a CBC, BMP and complete metabolic panel  stat.  Discharge Diagnoses:  Principal Problem:   Acute on chronic respiratory failure with hypoxia (HCC) Active Problems:   Hypotension due to hypovolemia   Hyperlipidemia associated with type 2 diabetes mellitus ([place])   Essential hypertension   Morbid obesity due to excess calories (HCC)   RLS (restless legs syndrome)   Type 2 diabetes mellitus with hyperlipidemia (HCC)   OSA and COPD overlap syndrome (HCC)   Acute on chronic diastolic CHF (congestive heart failure) (HCC)   Gastritis and gastroduodenitis   Acute diastolic heart failure (HCC)   AKI (acute kidney injury) ([place] [employer])   Obesity, Class III, BMI 40-49.9 (morbid obesity) ([place])   Acute lower UTI   E. coli UTI  Home health is not involved.   Images while in the hospital: DG CHEST PORT 1 VIEW  Result Date: 12/26/2019 CLINICAL DATA:  Hypoxia. EXAM: PORTABLE CHEST 1 VIEW COMPARISON:  Radiograph yesterday.  CT 06/30/2019 FINDINGS: Cardiomegaly is stable. Unchanged mediastinal contours. Improving interstitial opacities from prior exam. No confluent airspace disease. No pneumothorax. No large pleural effusion. Degenerative change of both shoulders. IMPRESSION: Stable cardiomegaly. Improving interstitial opacities from prior exam may represent pulmonary edema or infection. No new abnormality. Electronically Signed   By: Keith Rake M.D.   On: 12/26/2019 20:30   DG CHEST PORT 1 VIEW  Result Date: 12/25/2019 CLINICAL DATA:  Shortness of breath. EXAM: PORTABLE CHEST 1 VIEW COMPARISON:  CT 08/28/2019.  Chest x-ray 07/24/2019. FINDINGS: Cardiomegaly. Diffuse bilateral pulmonary infiltrates/edema. Small bilateral pleural effusions. No pneumothorax. Thoracic spine scoliosis and degenerative change. IMPRESSION: 1.  Cardiomegaly. 2. Diffuse bilateral pulmonary infiltrates/edema. Small bilateral pleural effusions. Electronically Signed   By: Marcello Moores  Register   On: 12/25/2019 13:26     Current Outpatient Medications (Endocrine & Metabolic):   Marland Kitchen  Insulin Glargine (BASAGLAR KWIKPEN) 100 UNIT/ML SOPN, Inject 70 units Daily for Diabetes (Patient taking differently: Inject 70 Units into the skin at bedtime. Inject 70 units Daily for Diabetes) .  liraglutide (VICTOZA) 18 MG/3ML SOPN, Inject 0.2 mLs (1.2 mg total) into the skin every morning. Marland Kitchen  NOVOLOG FLEXPEN 100 UNIT/ML FlexPen, INJECT 50 TO 75 UNITS 3 X /DAY BEFORE MEALS AS DIRECTED FOR DIABETES (Patient taking differently: Inject 20-25 Units into the skin 3 (three) times daily before meals. BS<150 Take 20 units BS>150 Take 25 units)  Current Outpatient Medications (Cardiovascular):  .  atorvastatin (LIPITOR) 80 MG tablet, TAKE 1 TABLET (80 MG TOTAL) BY MOUTH DAILY AT 6 PM. .  furosemide (LASIX) 40 MG tablet, Take 40 mg by mouth daily as needed for fluid.  .  metoprolol succinate (TOPROL-XL) 25 MG 24 hr tablet, Take 1 tablet (25 mg total) by mouth at bedtime. .  nitroGLYCERIN (NITROSTAT) 0.4 MG SL tablet, Place 1 tablet (0.4 mg total) under the tongue every 5 (five) minutes x 3 doses as needed for chest pain.  Current Outpatient Medications (Respiratory):  .  albuterol (VENTOLIN HFA) 108 (90 Base) MCG/ACT inhaler, Inhale 2 puffs into the lungs every 4 (four) hours as needed for wheezing or shortness of breath. .  Budeson-Glycopyrrol-Formoterol (BREZTRI  AEROSPHERE) 160-9-4.8 MCG/ACT AERO, Inhale 2 puffs into the lungs in the morning and at bedtime. .  triamcinolone (NASACORT) 55 MCG/ACT AERO nasal inhaler, PLACE 2 SPRAYS INTO THE NOSE AT BEDTIME. (Patient taking differently: Place 2 sprays into the nose at bedtime as needed (allergies). )  Current Outpatient Medications (Analgesics):  .  acetaminophen (TYLENOL) 325 MG tablet, Take 2 tablets (650 mg total) by mouth every 4 (four) hours as needed for headache or mild pain. Marland Kitchen  aspirin EC 81 MG EC tablet, Take 1 tablet (81 mg total) by mouth daily. Marland Kitchen  HYDROcodone-acetaminophen (NORCO/VICODIN) 5-325 MG tablet, Take 2 tablets by mouth every 4  (four) hours as needed. (Patient taking differently: Take 2 tablets by mouth every 4 (four) hours as needed for moderate pain. )  Current Outpatient Medications (Hematological):  .  ferrous sulfate 325 (65 FE) MG tablet, Take 325 mg by mouth 2 (two) times daily with a meal.  .  vitamin B-12 (CYANOCOBALAMIN) 1000 MCG tablet, Take 1,000 mcg by mouth daily.  Current Outpatient Medications (Other):  Marland Kitchen  buPROPion (WELLBUTRIN XL) 300 MG 24 hr tablet, Take 1 tablet (300 mg total) by mouth daily. Take 1 tablet Daily for Mood (Patient taking differently: Take 300 mg by mouth daily. ) .  Cholecalciferol ([employer] VIT D 5000 HIGH-POTENCY) 5000 units capsule, Take 5,000 Units by mouth 2 (two) times daily.  .  Continuous Blood Gluc Receiver (FREESTYLE LIBRE 14 DAY READER) DEVI, 1 Device by Does not apply route every 14 (fourteen) days. .  Continuous Blood Gluc Sensor (FREESTYLE LIBRE 14 DAY SENSOR) MISC, Apply 1 application topically every 14 (fourteen) days. .  diclofenac sodium (VOLTAREN) 1 % GEL, Apply 4 g topically 4 (four) times daily. (Patient taking differently: Apply 4 g topically 4 (four) times daily as needed (pain). ) .  escitalopram (LEXAPRO) 20 MG tablet, Take 1 tablet (20 mg total) by mouth daily. (Patient taking differently: Take 40 mg by mouth daily. ) .  gabapentin (NEURONTIN) 100 MG capsule, Take 1 to 2 capsules 3 to 4 x /day as needed for Painful Diabetic Neuropathy (Patient taking differently: Take 200 mg by mouth 2 (two) times daily. ) .  Insulin Pen Needle (BD PEN NEEDLE NANO U/F) 32G X 4 MM MISC, USE AS DIRECTED 4 TIMES A DAY .  ketoconazole (NIZORAL) 2 % cream, APPLY TO AFFECTED AREA TWICE A DAY (Patient taking differently: Apply 1 application topically daily as needed (itching). ) .  Lancets (ONETOUCH ULTRASOFT) lancets, Use as instructed .  ONETOUCH VERIO test strip, USE AS INSTRUCTED TO CHECK SUGAR 2 TIMES DAILY. .  pantoprazole (PROTONIX) 40 MG tablet, Take 1 tablet (40 mg total) by mouth  2 (two) times daily before a meal. .  Polyethylene Glycol 400 (BLINK TEARS OP), Place 1 drop into both eyes daily as needed (dry eyes).  Marland Kitchen  rOPINIRole (REQUIP) 3 MG tablet, Take 1 tablet 4 x /day for Restless Legs (Patient taking differently: Take 6 mg by mouth in the morning and at bedtime. ) .  tamsulosin (FLOMAX) 0.4 MG CAPS capsule, Take 1 capsule (0.4 mg total) by mouth daily. Marland Kitchen  tiZANidine (ZANAFLEX) 4 MG tablet, Take 1 tablet at Bedtime as needed for Muscle Spasms (Patient taking differently: Take 4 mg by mouth at bedtime as needed for muscle spasms. ) .  amoxicillin (AMOXIL) 875 MG tablet, Take 875 mg by mouth 2 (two) times daily.  (Patient not taking: Reported on 01/03/2020)  Past Medical History:  Diagnosis Date  .  Anemia    hx (04/14/2016)  . Anxiety   . Arthritis    "severe in my back; hands; ankles" (04/14/2016)  . Atypical chest pain 01/24/2018   Midline pain absent supine "constant" daytime since 12/31/17 > resolved as of 02/20/2018 on gerd/ gas diet   . Basal cell carcinoma    "several burned off; one cut off"  . CAD in native artery    a. NSTEMI 04/2016 - s/p DES toLAD and LCx. PCI to LCx notable for microembolization during cath.  . Chest pain- reslved with stopping Brilinta now on Plaix 04/16/2016  . Chronic lower back pain   . CKD (chronic kidney disease), stage III    stage 3  . Complication of anesthesia   . COPD (chronic obstructive pulmonary disease) ([place])   . Depression   . Diverticulosis   . DJD (degenerative joint disease)   . Family history of adverse reaction to anesthesia   . Gallstones   . GERD (gastroesophageal reflux disease)   . History of gout   . History of kidney stones   . Hyperlipidemia   . Hypertension   . IBS (irritable bowel syndrome)   . Ischemic cardiomyopathy    a. 04/2016: EF 40-50% by cath, 50-55% +WMA by echo.  . 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 ([place]) ~ 2015  . Morbid obesity ([place])   . NSTEMI (non-ST elevated myocardial  infarction) ([place]) 04/14/2016  . Obstructive sleep apnea of adult    appt for cpap 8-8 to adjust settings no cpap at this time  . On supplemental oxygen therapy    uses at bedtime or exposed to heat, 2Liters  . Peripheral neuropathy    feet and toes  . Peripheral vascular disease ([place])   . RLS (restless legs syndrome)   . Spinal stenosis   . Type II diabetes mellitus ([place])   . Vitamin D deficiency   . Walking pneumonia   . Wears partial dentures    lower     Allergies  Allergen Reactions  . Brilinta [Ticagrelor] Shortness Of Breath and Other (See Comments)    Chest pain (also)  . Aspirin Other (See Comments)    Can tolerate in small doses (is already on a blood thinner AND has kidney disease)  . Nsaids Other (See Comments)    Patient is taking a blood thinner and has kidney disease  . Minocycline Hcl Other (See Comments)    Welts  . Oruvail [Ketoprofen] Other (See Comments)    Has kidney disease and is taking a blood thinner  . Tricor [Fenofibrate]     Reaction unknown  . Vasotec [Enalaprilat]     Reaction unknown  . Zinc     Reaction unknown    ROS: all negative except above.   Physical Exam: Filed Weights   01/08/20 1405  Weight: 242 lb (109.8 kg)   BP 118/64   Pulse 73   Temp 98.1 F (36.7 C)   Wt 242 lb (109.8 kg)   SpO2 97%   BMI 45.73 kg/m  General Appearance: Well nourished, in no apparent distress. Eyes: PERRLA, EOMs, conjunctiva no swelling or erythema Sinuses: No Frontal/maxillary tenderness ENT/Mouth: Ext aud canals clear, TMs without erythema, bulging. No erythema, swelling, or exudate on post pharynx.  Tonsils not swollen or erythematous. Hearing normal.  Neck: Supple, thyroid normal.  Respiratory: Respiratory effort normal, BS equal bilaterally without rales, rhonchi, wheezing or stridor.  Cardio: RRR with no MRGs. Brisk peripheral pulses without edema.  Abdomen: Soft, + BS.  Non tender, no guarding, rebound, hernias, masses. Lymphatics: Non  tender without lymphadenopathy.  Musculoskeletal: Full ROM, 5/5 strength, normal gait.  Skin: Warm, dry without rashes, lesions, ecchymosis.  Neuro: Cranial nerves intact. Normal muscle tone, no cerebellar symptoms. Sensation intact.  Psych: Awake and oriented X 3, normal affect, Insight and Judgment appropriate.     Garnet Sierras, NP 2:34 PM [facility]

## 2020-01-09 LAB — COMPLETE METABOLIC PANEL WITH GFR
AG Ratio: 2.1 (calc) (ref 1.0–2.5)
ALT: 10 U/L (ref 6–29)
AST: 11 U/L (ref 10–35)
Albumin: 4.1 g/dL (ref 3.6–5.1)
Alkaline phosphatase (APISO): 79 U/L (ref 37–153)
BUN/Creatinine Ratio: 13 (calc) (ref 6–22)
BUN: 17 mg/dL (ref 7–25)
CO2: 27 mmol/L (ref 20–32)
Calcium: 9.5 mg/dL (ref 8.6–10.4)
Chloride: 106 mmol/L (ref 98–110)
Creat: 1.31 mg/dL — ABNORMAL HIGH (ref 0.60–0.93)
GFR, Est African American: 45 mL/min/{1.73_m2} — ABNORMAL LOW (ref >=60)
GFR, Est Non African American: 39 mL/min/{1.73_m2} — ABNORMAL LOW (ref >=60)
Globulin: 2 g/dL (calc) (ref 1.9–3.7)
Glucose, Bld: 135 mg/dL — ABNORMAL HIGH (ref 65–99)
Potassium: 5.1 mmol/L (ref 3.5–5.3)
Sodium: 144 mmol/L (ref 135–146)
Total Bilirubin: 0.5 mg/dL (ref 0.2–1.2)
Total Protein: 6.1 g/dL (ref 6.1–8.1)

## 2020-01-09 LAB — CBC WITH DIFFERENTIAL/PLATELET
Absolute Monocytes: 60 cells/uL — ABNORMAL LOW (ref 200–950)
Basophils Absolute: 9 cells/uL (ref 0–200)
Basophils Relative: 0.4 %
Eosinophils Absolute: 60 cells/uL (ref 15–500)
Eosinophils Relative: 2.6 %
HCT: 28.7 % — ABNORMAL LOW (ref 35.0–45.0)
Hemoglobin: 9.3 g/dL — ABNORMAL LOW (ref 11.7–15.5)
Lymphs Abs: 674 cells/uL — ABNORMAL LOW (ref 850–3900)
MCH: 33.6 pg — ABNORMAL HIGH (ref 27.0–33.0)
MCHC: 32.4 g/dL (ref 32.0–36.0)
MCV: 103.6 fL — ABNORMAL HIGH (ref 80.0–100.0)
MPV: 10 fL (ref 7.5–12.5)
Monocytes Relative: 2.6 %
Neutro Abs: 1497 cells/uL — ABNORMAL LOW (ref 1500–7800)
Neutrophils Relative %: 65.1 %
Platelets: 151 10*3/uL (ref 140–400)
RBC: 2.77 10*6/uL — ABNORMAL LOW (ref 3.80–5.10)
RDW: 13.4 % (ref 11.0–15.0)
Total Lymphocyte: 29.3 %
WBC: 2.3 10*3/uL — ABNORMAL LOW (ref 3.8–10.8)

## 2020-01-09 LAB — FERRITIN: Ferritin: 78 ng/mL (ref 16–288)

## 2020-01-09 NOTE — Telephone Encounter (Signed)
Pt came in the office on 01/03/2020

## 2020-01-11 DIAGNOSIS — J9621 Acute and chronic respiratory failure with hypoxia: Secondary | ICD-10-CM | POA: Diagnosis not present

## 2020-01-11 DIAGNOSIS — N390 Urinary tract infection, site not specified: Secondary | ICD-10-CM | POA: Diagnosis not present

## 2020-01-11 DIAGNOSIS — N1831 Chronic kidney disease, stage 3a: Secondary | ICD-10-CM | POA: Diagnosis not present

## 2020-01-11 DIAGNOSIS — I10 Essential (primary) hypertension: Secondary | ICD-10-CM | POA: Diagnosis not present

## 2020-01-11 DIAGNOSIS — D631 Anemia in chronic kidney disease: Secondary | ICD-10-CM | POA: Diagnosis not present

## 2020-01-12 DIAGNOSIS — I2510 Atherosclerotic heart disease of native coronary artery without angina pectoris: Secondary | ICD-10-CM | POA: Diagnosis not present

## 2020-01-12 DIAGNOSIS — I5032 Chronic diastolic (congestive) heart failure: Secondary | ICD-10-CM | POA: Diagnosis not present

## 2020-01-12 LAB — URINALYSIS W MICROSCOPIC + REFLEX CULTURE
Bacteria, UA: NONE SEEN /HPF
Bilirubin Urine: NEGATIVE
Glucose, UA: NEGATIVE
Hgb urine dipstick: NEGATIVE
Hyaline Cast: NONE SEEN /LPF
Ketones, ur: NEGATIVE
Leukocyte Esterase: NEGATIVE
Nitrites, Initial: NEGATIVE
Protein, ur: NEGATIVE
RBC / HPF: NONE SEEN /HPF (ref 0–2)
Specific Gravity, Urine: 1.011 (ref 1.001–1.03)
Squamous Epithelial / HPF: NONE SEEN /HPF (ref <=5)
WBC, UA: NONE SEEN /HPF (ref 0–5)
pH: 5.5 (ref 5.0–8.0)

## 2020-01-12 LAB — NO CULTURE INDICATED

## 2020-02-04 ENCOUNTER — Ambulatory Visit: Payer: PPO | Admitting: Emergency Medicine

## 2020-02-05 ENCOUNTER — Telehealth: Payer: Self-pay | Admitting: Emergency Medicine

## 2020-02-06 NOTE — Telephone Encounter (Signed)
Sent message to [employer] 

## 2020-02-06 NOTE — Telephone Encounter (Signed)
I received this message from [place], Tobey Bride, Allyne Gee, [place]; Verl Blalock PAP order was received and entered. However looking over notes it looks like we are missing the following to process :    **BIPAP Titration, since the CPAP TT recommends the pt to go back for one, since we received 2 Rx, one for CPAP and 1 for BIPAP, marketing rep clarify the order stated its for a BIPAP, but the TT 12/16/19 recommends for the pt to go back for a BIPAP TT, it does not recommend the machine.   Once this info is received we will be able to move forward with processing this order.   Thanks  [employer]

## 2020-02-06 NOTE — Telephone Encounter (Signed)
Spoke with the pt   I let her know that it looks like she may need a BIPAP titration study  Can you please clarify this is what is needed?  She also mentioned that [employer] has not called her about her POC  Please advise, thanks

## 2020-02-07 ENCOUNTER — Telehealth: Payer: Self-pay | Admitting: Family Medicine

## 2020-02-07 NOTE — Telephone Encounter (Signed)
lvm for patient to return call to get follow up scheduled with [place] from recall list  

## 2020-02-08 ENCOUNTER — Other Ambulatory Visit: Payer: Self-pay

## 2020-02-08 ENCOUNTER — Inpatient Hospital Stay (HOSPITAL_BASED_OUTPATIENT_CLINIC_OR_DEPARTMENT_OTHER): Payer: PPO | Admitting: Hematology

## 2020-02-08 ENCOUNTER — Inpatient Hospital Stay: Payer: PPO | Attending: Hematology

## 2020-02-08 VITALS — BP 126/55 | HR 86 | Temp 98.2°F | Resp 18 | Ht 61.0 in | Wt 245.9 lb

## 2020-02-08 DIAGNOSIS — D508 Other iron deficiency anemias: Secondary | ICD-10-CM | POA: Diagnosis not present

## 2020-02-08 DIAGNOSIS — D801 Nonfamilial hypogammaglobulinemia: Secondary | ICD-10-CM

## 2020-02-08 DIAGNOSIS — R197 Diarrhea, unspecified: Secondary | ICD-10-CM | POA: Diagnosis not present

## 2020-02-08 DIAGNOSIS — D649 Anemia, unspecified: Secondary | ICD-10-CM

## 2020-02-08 DIAGNOSIS — D696 Thrombocytopenia, unspecified: Secondary | ICD-10-CM

## 2020-02-08 DIAGNOSIS — D61818 Other pancytopenia: Secondary | ICD-10-CM | POA: Insufficient documentation

## 2020-02-08 DIAGNOSIS — I129 Hypertensive chronic kidney disease with stage 1 through stage 4 chronic kidney disease, or unspecified chronic kidney disease: Secondary | ICD-10-CM | POA: Diagnosis not present

## 2020-02-08 DIAGNOSIS — D709 Neutropenia, unspecified: Secondary | ICD-10-CM | POA: Diagnosis not present

## 2020-02-08 DIAGNOSIS — Z87891 Personal history of nicotine dependence: Secondary | ICD-10-CM | POA: Diagnosis not present

## 2020-02-08 DIAGNOSIS — N183 Chronic kidney disease, stage 3 unspecified: Secondary | ICD-10-CM | POA: Insufficient documentation

## 2020-02-08 DIAGNOSIS — E1122 Type 2 diabetes mellitus with diabetic chronic kidney disease: Secondary | ICD-10-CM | POA: Diagnosis not present

## 2020-02-08 LAB — CBC WITH DIFFERENTIAL/PLATELET
Abs Immature Granulocytes: 0.01 10*3/uL (ref 0.00–0.07)
Basophils Absolute: 0 10*3/uL (ref 0.0–0.1)
Basophils Relative: 0 %
Eosinophils Absolute: 0.1 10*3/uL (ref 0.0–0.5)
Eosinophils Relative: 4 %
HCT: 29.5 % — ABNORMAL LOW (ref 36.0–46.0)
Hemoglobin: 9.2 g/dL — ABNORMAL LOW (ref 12.0–15.0)
Immature Granulocytes: 0 %
Lymphocytes Relative: 31 %
Lymphs Abs: 0.7 10*3/uL (ref 0.7–4.0)
MCH: 33.7 pg (ref 26.0–34.0)
MCHC: 31.2 g/dL (ref 30.0–36.0)
MCV: 108.1 fL — ABNORMAL HIGH (ref 80.0–100.0)
Monocytes Absolute: 0.1 10*3/uL (ref 0.1–1.0)
Monocytes Relative: 3 %
Neutro Abs: 1.5 10*3/uL — ABNORMAL LOW (ref 1.7–7.7)
Neutrophils Relative %: 62 %
Platelets: 120 10*3/uL — ABNORMAL LOW (ref 150–400)
RBC: 2.73 MIL/uL — ABNORMAL LOW (ref 3.87–5.11)
RDW: 13.9 % (ref 11.5–15.5)
WBC: 2.4 10*3/uL — ABNORMAL LOW (ref 4.0–10.5)
nRBC: 0 % (ref 0.0–0.2)

## 2020-02-08 LAB — IRON AND TIBC
Iron: 66 ug/dL (ref 41–142)
Saturation Ratios: 18 % — ABNORMAL LOW (ref 21–57)
TIBC: 370 ug/dL (ref 236–444)
UIBC: 304 ug/dL (ref 120–384)

## 2020-02-08 LAB — CMP (CANCER CENTER ONLY)
ALT: 8 U/L (ref 0–44)
AST: 10 U/L — ABNORMAL LOW (ref 15–41)
Albumin: 3.5 g/dL (ref 3.5–5.0)
Alkaline Phosphatase: 71 U/L (ref 38–126)
Anion gap: 7 (ref 5–15)
BUN: 17 mg/dL (ref 8–23)
CO2: 28 mmol/L (ref 22–32)
Calcium: 9.4 mg/dL (ref 8.9–10.3)
Chloride: 106 mmol/L (ref 98–111)
Creatinine: 1.41 mg/dL — ABNORMAL HIGH (ref 0.44–1.00)
GFR, Est AFR Am: 41 mL/min — ABNORMAL LOW (ref >60)
GFR, Estimated: 35 mL/min — ABNORMAL LOW (ref >60)
Glucose, Bld: 126 mg/dL — ABNORMAL HIGH (ref 70–99)
Potassium: 4.8 mmol/L (ref 3.5–5.1)
Sodium: 141 mmol/L (ref 135–145)
Total Bilirubin: 0.3 mg/dL (ref 0.3–1.2)
Total Protein: 6.3 g/dL — ABNORMAL LOW (ref 6.5–8.1)

## 2020-02-08 LAB — FERRITIN: Ferritin: 34 ng/mL (ref 11–307)

## 2020-02-08 LAB — VITAMIN B12: Vitamin B-12: 1263 pg/mL — ABNORMAL HIGH (ref 180–914)

## 2020-02-08 NOTE — Progress Notes (Signed)
HEMATOLOGY/ONCOLOGY CLINIC NOTE  Date of Service: 02/08/2020  Patient Care Team: Janet Pinto, MD as PCP - General (Internal Medicine) Skeet Latch, MD as PCP - Cardiology (Cardiology) Marygrace Drought, MD as Consulting Physician (Ophthalmology) Larey Dresser, MD as Consulting Physician (Cardiology) Nobie Putnam, MD as Consulting Physician (Oncology) Tania Ade, MD as Consulting Physician (Orthopedic Surgery) Danis, Kirke Corin, MD as Consulting Physician (Gastroenterology)  REFERRING PHYSICIAN: [name], MD  CHIEF COMPLAINTS/PURPOSE OF CONSULTATION:  Pancytopenia  HISTORY OF PRESENTING ILLNESS:  Janet Choi is a wonderful 79 y.o. female who has been referred to [place] by Janet Pinto, MD for evaluation and management of pancytopenia. Pt is accompanied today by her sister, Janet Choi. The pt reports that she is doing well overall.   The pt reports she is good. She has acute inflammation in the gallbladder and it bothers her when she eats. Pt has chronic diarrhea and has been diagnosed with sleep apnea. She was given a sleep apnea machine about 7 years ago but did not use it. Pt's diabetes has not been well controlled recently. She is also on aspirin since her heart attack. Pt has been on Requip for several years now. She also has been on gabapentin for neuropathy for several years. Pt has starting taking iron supplement this week. She was recently placed on protonix and taken off of 100mg  of allopurinol. There is no liver or thyroid problems that she knows of.   She felt really bad after the second dose of the COVID19 vaccine and stayed in for 14 days back in April. She was really sick and her arm was red and swollen where the shot was administered. At the beginning of the year the pt had cellulitis and was on antibiotics for more than 3 weeks.   Pt has never had blood transfusions or IV iron. She has not had a gout attack in several years.   Of note prior to  the patient's visit today, pt has had US Abdomen Complete ([id number]) completed on 10/17/19 with results revealing "Cholelithiasis with positive sonographic Murphy's sign and mild wall thickening these changes would be consistent with acute cholecystitis in the appropriate clinical setting. HIDA scan may be helpful for further evaluation. Left renal calculi without obstructive changes. Left renal cyst."  Most recent lab results (10/15/19) of CBC is as follows: all values are WNL except for WBC at 2.5K, RBC at 2.65, Hemoglobin at 8.7, HCT at 26.5, Neutro Abs at 1353, Absolute Monocytes at 133, Glucose at 107, Creat at 1.30, GFR, Est Non Af Am at 39, GFR, Est AFR Am at 46, Total Protein at 5.8, AST at 8  On review of systems, pt reports SOB, chronic diarrhea, sleep apnea, fatigue, abdominal tenderness and denies infections, black/blood in stool, fever, chills, night sweats, unexpected weight loss, mouth soars, and any other symptoms.   On PMHx the pt reports chronic diarrhea, sleep apnea, diabetes, myocardial infarction, Vitamin B12 deficient, neuropathy, cellulitis  On Social Hx the pt reports no alcohol use   INTERVAL HISTORY:  [name] is a wonderful 79 y.o. female who is here for evaluation and management of pancytopenia. The patient's last visit with [place] was on 11/08/2019. The pt reports that she is doing well overall.  The pt reports no acute new symptoms.feels about the same. Denies any overt GI bleeding. Has been taking her iron pills  Labs reviewed with patient today.  MEDICAL HISTORY:  Past Medical History:  Diagnosis Date  .  Anemia    hx (04/14/2016)  . Anxiety   . Arthritis    "severe in my back; hands; ankles" (04/14/2016)  . Atypical chest pain 01/24/2018   Midline pain absent supine "constant" daytime since 12/31/17 > resolved as of 02/20/2018 on gerd/ gas diet   . Basal cell carcinoma    "several burned off; one cut off"  . CAD in native artery    a. NSTEMI 04/2016  - s/p DES toLAD and LCx. PCI to LCx notable for microembolization during cath.  . Chest pain- reslved with stopping Brilinta now on Plaix 04/16/2016  . Chronic lower back pain   . CKD (chronic kidney disease), stage III    stage 3  . Complication of anesthesia   . COPD (chronic obstructive pulmonary disease) ([place])   . Depression   . Diverticulosis   . DJD (degenerative joint disease)   . Family history of adverse reaction to anesthesia   . Gallstones   . GERD (gastroesophageal reflux disease)   . History of gout   . History of kidney stones   . Hyperlipidemia   . Hypertension   . IBS (irritable bowel syndrome)   . Ischemic cardiomyopathy    a. 04/2016: EF 40-50% by cath, 50-55% +WMA by echo.  . 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 ([place]) ~ 2015  . Morbid obesity ([place])   . NSTEMI (non-ST elevated myocardial infarction) ([place]) 04/14/2016  . Obstructive sleep apnea of adult    appt for cpap 8-8 to adjust settings no cpap at this time  . On supplemental oxygen therapy    uses at bedtime or exposed to heat, 2Liters  . Peripheral neuropathy    feet and toes  . Peripheral vascular disease ([place])   . RLS (restless legs syndrome)   . Spinal stenosis   . Type II diabetes mellitus ([place])   . Vitamin D deficiency   . Walking pneumonia   . Wears partial dentures    lower     SURGICAL HISTORY: Past Surgical History:  Procedure Laterality Date  . APPENDECTOMY    . BASAL CELL CARCINOMA EXCISION Left    leg  . BIOPSY  12/25/2019   Procedure: BIOPSY;  Surgeon: Lavena Bullion, DO;  Location: [facility];  Service: Gastroenterology;;  EGD and COLON  . CARDIAC CATHETERIZATION N/A 04/15/2016   Procedure: Left Heart Cath and Coronary Angiography;  Surgeon: Belva Crome, MD;  Location: [facility];  Service: Cardiovascular;  Laterality: N/A;  . CARDIAC CATHETERIZATION N/A 04/15/2016   Procedure: Coronary Stent Intervention;  Surgeon: Belva Crome, MD;  Location: [facility];  Service: Cardiovascular;  Laterality: N/A;  Mid LAD Mid CFX  . CARPAL TUNNEL RELEASE Right    with trigger finger release  . CATARACT EXTRACTION, BILATERAL Bilateral   . COLONOSCOPY WITH PROPOFOL N/A 12/25/2019   Procedure: COLONOSCOPY WITH PROPOFOL;  Surgeon: Lavena Bullion, DO;  Location: [facility];  Service: Gastroenterology;  Laterality: N/A;  . DEBRIDEMENT TENNIS ELBOW Right   . ESOPHAGOGASTRODUODENOSCOPY (EGD) WITH PROPOFOL N/A 12/25/2019   Procedure: ESOPHAGOGASTRODUODENOSCOPY (EGD) WITH PROPOFOL;  Surgeon: Lavena Bullion, DO;  Location: [facility];  Service: Gastroenterology;  Laterality: N/A;  . LUMBAR DISC SURGERY  X 2  . MELANOMA EXCISION Left    "towards the back of my neck"  . POLYPECTOMY  12/25/2019   Procedure: POLYPECTOMY;  Surgeon: Lavena Bullion, DO;  Location: [facility];  Service: Gastroenterology;;  . SHOULDER ARTHROSCOPY  W/ ROTATOR CUFF REPAIR Left   . SHOULDER OPEN ROTATOR CUFF REPAIR Right   . TOTAL KNEE ARTHROPLASTY Bilateral      SOCIAL HISTORY: Social History   Socioeconomic History  . Marital status: Widowed    Spouse name: Not on file  . Number of children: 1  . Years of education: Not on file  . Highest education level: Not on file  Occupational History  . Occupation: retired  Tobacco Use  . Smoking status: Former Smoker    Packs/day: 2.50    Years: 25.00    Pack years: 62.50    Types: Cigarettes    Quit date: 11/11/1983    Years since quitting: 36.2  . Smokeless tobacco: Never Used  Vaping Use  . Vaping Use: Never used  Substance and Sexual Activity  . Alcohol use: No    Alcohol/week: 0.0 standard drinks  . Drug use: No  . Sexual activity: Not Currently  Other Topics Concern  . Not on file  Social History Narrative  . Not on file   Social Determinants of Health   Financial Resource Strain:   . Difficulty of Paying Living Expenses: Not on file  Food Insecurity:   . Worried About Charity fundraiser in the Last  Year: Not on file  . Ran Out of Food in the Last Year: Not on file  Transportation Needs:   . Lack of Transportation (Medical): Not on file  . Lack of Transportation (Non-Medical): Not on file  Physical Activity:   . Days of Exercise per Week: Not on file  . Minutes of Exercise per Session: Not on file  Stress:   . Feeling of Stress : Not on file  Social Connections:   . Frequency of Communication with Friends and Family: Not on file  . Frequency of Social Gatherings with Friends and Family: Not on file  . Attends Religious Services: Not on file  . Active Member of Clubs or Organizations: Not on file  . Attends Archivist Meetings: Not on file  . Marital Status: Not on file  Intimate Partner Violence:   . Fear of Current or Ex-Partner: Not on file  . Emotionally Abused: Not on file  . Physically Abused: Not on file  . Sexually Abused: Not on file     FAMILY HISTORY: Family History  Problem Relation Age of Onset  . Heart disease Mother   . Heart attack Mother   . Kidney disease Father   . AAA (abdominal aortic aneurysm) Father   . Parkinson's disease Father   . Cancer Brother        type unknown  . Heart disease Son   . Cancer Sister        female   . Alzheimer's disease Sister   . Colon cancer Neg Hx   . Colon polyps Neg Hx   . Esophageal cancer Neg Hx   . Pancreatic cancer Neg Hx   . Stomach cancer Neg Hx   . Liver disease Neg Hx   . Diabetes Neg Hx      ALLERGIES:   is allergic to brilinta [ticagrelor], aspirin, nsaids, minocycline hcl, oruvail [ketoprofen], tricor [fenofibrate], vasotec [enalaprilat], and zinc.   MEDICATIONS:  Current Outpatient Medications  Medication Sig Dispense Refill  . acetaminophen (TYLENOL) 325 MG tablet Take 2 tablets (650 mg total) by mouth every 4 (four) hours as needed for headache or mild pain.    Marland Kitchen albuterol (VENTOLIN HFA) 108 (90 Base) MCG/ACT inhaler  Inhale 2 puffs into the lungs every 4 (four) hours as needed for  wheezing or shortness of breath. 18 g 3  . aspirin EC 81 MG EC tablet Take 1 tablet (81 mg total) by mouth daily.    Marland Kitchen atorvastatin (LIPITOR) 80 MG tablet TAKE 1 TABLET (80 MG TOTAL) BY MOUTH DAILY AT 6 PM. 90 tablet 2  . Budeson-Glycopyrrol-Formoterol (BREZTRI AEROSPHERE) 160-9-4.8 MCG/ACT AERO Inhale 2 puffs into the lungs in the morning and at bedtime. 5.9 g 2  . buPROPion (WELLBUTRIN XL) 300 MG 24 hr tablet Take 1 tablet (300 mg total) by mouth daily. Take 1 tablet Daily for Mood (Patient taking differently: Take 300 mg by mouth daily. ) 90 tablet 1  . Cholecalciferol ([employer] VIT D 5000 HIGH-POTENCY) 5000 units capsule Take 5,000 Units by mouth 2 (two) times daily.     . Continuous Blood Gluc Receiver (FREESTYLE LIBRE 14 DAY READER) DEVI 1 Device by Does not apply route every 14 (fourteen) days. 2 each 4  . Continuous Blood Gluc Sensor (FREESTYLE LIBRE 14 DAY SENSOR) MISC Apply 1 application topically every 14 (fourteen) days. 2 each 4  . diclofenac sodium (VOLTAREN) 1 % GEL Apply 4 g topically 4 (four) times daily. (Patient taking differently: Apply 4 g topically 4 (four) times daily as needed (pain). ) 100 g 3  . escitalopram (LEXAPRO) 20 MG tablet Take 1 tablet (20 mg total) by mouth daily. 90 tablet 3  . ferrous sulfate 325 (65 FE) MG tablet Take 325 mg by mouth 2 (two) times daily with a meal.     . furosemide (LASIX) 40 MG tablet Take 40 mg by mouth daily as needed for fluid.     Marland Kitchen gabapentin (NEURONTIN) 100 MG capsule Take 1 to 2 capsules 3 to 4 x /day as needed for Painful Diabetic Neuropathy (Patient taking differently: Take 200 mg by mouth 2 (two) times daily. ) 360 capsule 3  . Insulin Glargine (BASAGLAR KWIKPEN) 100 UNIT/ML SOPN Inject 70 units Daily for Diabetes (Patient taking differently: Inject 70 Units into the skin at bedtime. Inject 70 units Daily for Diabetes) 20 pen 3  . Insulin Pen Needle (BD PEN NEEDLE NANO U/F) 32G X 4 MM MISC USE AS DIRECTED 4 TIMES A DAY 400 each 3  .  ketoconazole (NIZORAL) 2 % cream APPLY TO AFFECTED AREA TWICE A DAY (Patient taking differently: Apply 1 application topically daily as needed (itching). ) 60 g 3  . Lancets (ONETOUCH ULTRASOFT) lancets Use as instructed 100 each 12  . liraglutide (VICTOZA) 18 MG/3ML SOPN Inject 0.2 mLs (1.2 mg total) into the skin every morning. 6 mL 3  . metoprolol succinate (TOPROL-XL) 25 MG 24 hr tablet Take 1 tablet (25 mg total) by mouth at bedtime. 90 tablet 3  . nitroGLYCERIN (NITROSTAT) 0.4 MG SL tablet Place 1 tablet (0.4 mg total) under the tongue every 5 (five) minutes x 3 doses as needed for chest pain. 25 tablet 4  . NOVOLOG FLEXPEN 100 UNIT/ML FlexPen INJECT 50 TO 75 UNITS 3 X /DAY BEFORE MEALS AS DIRECTED FOR DIABETES (Patient taking differently: Inject 20-25 Units into the skin 3 (three) times daily before meals. BS<150 Take 20 units BS>150 Take 25 units) 15 mL 4  . ONETOUCH VERIO test strip USE AS INSTRUCTED TO CHECK SUGAR 2 TIMES DAILY. 200 each 4  . pantoprazole (PROTONIX) 40 MG tablet Take 1 tablet (40 mg total) by mouth 2 (two) times daily before a meal. 180 tablet 1  .  Polyethylene Glycol 400 (BLINK TEARS OP) Place 1 drop into both eyes daily as needed (dry eyes).     Marland Kitchen rOPINIRole (REQUIP) 3 MG tablet Take 1 tablet 4 x /day for Restless Legs (Patient taking differently: Take 6 mg by mouth in the morning and at bedtime. ) 360 tablet 3  . tamsulosin (FLOMAX) 0.4 MG CAPS capsule Take 1 capsule (0.4 mg total) by mouth daily. 30 capsule 0  . tiZANidine (ZANAFLEX) 4 MG tablet Take 1 tablet at Bedtime as needed for Muscle Spasms (Patient taking differently: Take 4 mg by mouth at bedtime as needed for muscle spasms. ) 90 tablet 1  . triamcinolone (NASACORT) 55 MCG/ACT AERO nasal inhaler PLACE 2 SPRAYS INTO THE NOSE AT BEDTIME. (Patient taking differently: Place 2 sprays into the nose at bedtime as needed (allergies). ) 48 g 3  . vitamin B-12 (CYANOCOBALAMIN) 1000 MCG tablet Take 1,000 mcg by mouth daily.      No current facility-administered medications for this visit.   REVIEW OF SYSTEMS:   A 10+ POINT REVIEW OF SYSTEMS WAS OBTAINED including neurology, dermatology, psychiatry, cardiac, respiratory, lymph, extremities, GI, GU, Musculoskeletal, constitutional, breasts, reproductive, HEENT.  All pertinent positives are noted in the HPI.  All others are negative.   PHYSICAL EXAMINATION: ECOG PERFORMANCE STATUS: 2 - Symptomatic, <50% confined to bed  Vitals:   02/08/20 1107  BP: (!) 126/55  Pulse: 86  Resp: 18  Temp: 98.2 F (36.8 C)  SpO2: 97%   Filed Weights   02/08/20 1107  Weight: 245 lb 14.4 oz (111.5 kg)   Body mass index is 46.46 kg/m.  NAD GENERAL:alert, in no acute distress and comfortable SKIN: no acute rashes, no significant lesions EYES: conjunctiva are pink and non-injected, sclera anicteric OROPHARYNX: MMM, no exudates, no oropharyngeal erythema or ulceration NECK: supple, no JVD LYMPH:  no palpable lymphadenopathy in the cervical, axillary or inguinal regions LUNGS: clear to auscultation b/l with normal respiratory effort HEART: regular rate & rhythm ABDOMEN:  normoactive bowel sounds , non tender, not distended. No palpable hepatosplenomegaly.  Extremity: no pedal edema PSYCH: alert & oriented x 3 with fluent speech NEURO: no focal motor/sensory deficits  LABORATORY DATA:  I have reviewed the data as listed  CBC Latest Ref Rng & Units 02/08/2020 01/08/2020 12/29/2019  WBC 4.0 - 10.5 K/uL 2.4(L) 2.3(L) 1.8(L)  Hemoglobin 12.0 - 15.0 g/dL 9.2(L) 9.3(L) 8.0(L)  Hematocrit 36 - 46 % 29.5(L) 28.7(L) 26.4(L)  Platelets 150 - 400 K/uL 120(L) 151 118(L)   . CBC    Component Value Date/Time   WBC 2.4 (L) 02/08/2020 1035   RBC 2.73 (L) 02/08/2020 1035   HGB 9.2 (L) 02/08/2020 1035   HGB 9.1 (L) 05/15/2009 1032   HCT 29.5 (L) 02/08/2020 1035   HCT 28.2 (L) 10/18/2019 1233   HCT 31.6 (L) 05/15/2009 1032   PLT 120 (L) 02/08/2020 1035   PLT 243 05/15/2009 1032    MCV 108.1 (H) 02/08/2020 1035   MCV 78.8 (L) 05/15/2009 1032   MCH 33.7 02/08/2020 1035   MCHC 31.2 02/08/2020 1035   RDW 13.9 02/08/2020 1035   RDW 17.2 (H) 05/15/2009 1032   LYMPHSABS 0.7 02/08/2020 1035   LYMPHSABS 1.2 05/15/2009 1032   MONOABS 0.1 02/08/2020 1035   MONOABS 0.3 05/15/2009 1032   EOSABS 0.1 02/08/2020 1035   EOSABS 0.1 05/15/2009 1032   BASOSABS 0.0 02/08/2020 1035   BASOSABS 0.0 05/15/2009 1032     CMP Latest Ref Rng & Units  02/08/2020 01/08/2020 12/29/2019  Glucose 70 - 99 mg/dL 126(H) 135(H) 194(H)  BUN 8 - 23 mg/dL 17 17 31(H)  Creatinine 0.44 - 1.00 mg/dL 1.41(H) 1.31(H) 1.53(H)  Sodium 135 - 145 mmol/L 141 144 143  Potassium 3.5 - 5.1 mmol/L 4.8 5.1 4.3  Chloride 98 - 111 mmol/L 106 106 106  CO2 22 - 32 mmol/L 28 27 26   Calcium 8.9 - 10.3 mg/dL 9.4 9.5 8.7(L)  Total Protein 6.5 - 8.1 g/dL 6.3(L) 6.1 -  Total Bilirubin 0.3 - 1.2 mg/dL 0.3 0.5 -  Alkaline Phos 38 - 126 U/L 71 - -  AST 15 - 41 U/L 10(L) 11 -  ALT 0 - 44 U/L 8 10 -    RADIOGRAPHIC STUDIES: I have personally reviewed the radiological images as listed and agreed with the findings in the report. No results found.   ASSESSMENT & PLAN:   CORRISA GIBBY is a 79 y.o. female with:  1. 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 ? MDS vs anemia of CKD 2. Thrombocytopenia - mild 120-->150k range 3. Leucopenia/Neutropenia. Hagerman ha simproved from 1.2k-->1.5k  PLAN: -discussed labs results with patient. -ferritin still not optimal in the 30's, gola ferritin >100 in the setting of CKD. Recommends continue iron supplement twice a day -will consider IV Iron on f/u if ferritin still low. -might need consideration of EPO if anemia hgb<9 despite ferritin >100 -Recommends continue 1000 mcg of Vitamin B12 once a day, Vitamin D and iron supplements  -Recommends taking OTC 1 capsule of vitamin B-complex daily  -Will see back in 2 months -patient prefers to hold off BM Bx for MDS evaluation in the context of multiple  other medical problems   FOLLOW UP RTC with Dr [name] with labs in 2 months   The total time spent in the appt was 20 minutes and more than 50% was on counseling and direct patient cares.  All of the patient's questions were answered with apparent satisfaction. The patient knows to call the clinic with any problems, questions or concerns.   Sullivan Lone MD [place] AAHIVMS [facility] Hematology/Oncology Physician [facility]  (Office):       [phone] (Work cell):  [phone] (Fax):           [phone]  02/08/2020 8:07 AM  I, Yevette Edwards, am acting as a scribe for Dr. Sullivan Lone.   .I have reviewed the above documentation for accuracy and completeness, and I agree with the above. Brunetta Genera MD

## 2020-02-09 LAB — IGG: IgG (Immunoglobin G), Serum: 440 mg/dL — ABNORMAL LOW (ref 586–1602)

## 2020-02-10 ENCOUNTER — Other Ambulatory Visit: Payer: Self-pay | Admitting: Internal Medicine

## 2020-02-10 MED ORDER — TIZANIDINE HCL 4 MG PO TABS
ORAL_TABLET | ORAL | 0 refills | Status: DC
Start: 2020-02-10 — End: 2020-05-07

## 2020-02-11 ENCOUNTER — Other Ambulatory Visit: Payer: PPO

## 2020-02-11 DIAGNOSIS — I2510 Atherosclerotic heart disease of native coronary artery without angina pectoris: Secondary | ICD-10-CM | POA: Diagnosis not present

## 2020-02-11 DIAGNOSIS — I5032 Chronic diastolic (congestive) heart failure: Secondary | ICD-10-CM | POA: Diagnosis not present

## 2020-02-12 ENCOUNTER — Ambulatory Visit: Payer: PPO | Admitting: Physician Assistant

## 2020-02-15 NOTE — Telephone Encounter (Signed)
Patient got POC on 02/11/2020 if its a bipap she needs she will need a titration study

## 2020-02-15 NOTE — Telephone Encounter (Signed)
Pt stated that she not yet to receive a POC but a guy from the home health company did come to her house last week to work on her home concentrator and per pt he stated POC were all on back order. Pt was notified she may need Bi-PAP titration study and would like to talk to Dr. Brock Ra in [place] on Monday 02/18/2020.

## 2020-02-18 ENCOUNTER — Other Ambulatory Visit: Payer: Self-pay

## 2020-02-18 ENCOUNTER — Ambulatory Visit (INDEPENDENT_AMBULATORY_CARE_PROVIDER_SITE_OTHER): Payer: PPO | Admitting: Emergency Medicine

## 2020-02-18 ENCOUNTER — Encounter: Payer: Self-pay | Admitting: Emergency Medicine

## 2020-02-18 ENCOUNTER — Ambulatory Visit
Admission: RE | Admit: 2020-02-18 | Discharge: 2020-02-18 | Disposition: A | Discharge status: Home / Self Care | Payer: PPO | Source: Ambulatory Visit | Attending: Emergency Medicine | Admitting: Emergency Medicine

## 2020-02-18 VITALS — BP 130/68 | HR 85 | Temp 97.7°F | Ht 61.0 in | Wt 244.6 lb

## 2020-02-18 DIAGNOSIS — I700 Atherosclerosis of aorta: Secondary | ICD-10-CM | POA: Diagnosis not present

## 2020-02-18 DIAGNOSIS — G4733 Obstructive sleep apnea (adult) (pediatric): Secondary | ICD-10-CM | POA: Diagnosis not present

## 2020-02-18 DIAGNOSIS — J449 Chronic obstructive pulmonary disease, unspecified: Secondary | ICD-10-CM | POA: Diagnosis not present

## 2020-02-18 DIAGNOSIS — R911 Solitary pulmonary nodule: Secondary | ICD-10-CM

## 2020-02-18 DIAGNOSIS — J9621 Acute and chronic respiratory failure with hypoxia: Secondary | ICD-10-CM

## 2020-02-18 DIAGNOSIS — I739 Peripheral vascular disease, unspecified: Secondary | ICD-10-CM | POA: Diagnosis not present

## 2020-02-18 DIAGNOSIS — I2510 Atherosclerotic heart disease of native coronary artery without angina pectoris: Secondary | ICD-10-CM | POA: Diagnosis not present

## 2020-02-18 DIAGNOSIS — K449 Diaphragmatic hernia without obstruction or gangrene: Secondary | ICD-10-CM | POA: Diagnosis not present

## 2020-02-18 NOTE — Progress Notes (Signed)
   Subjective:    Patient ID: Janet Choi, female    DOB: [birth year], 79 y.o.   MRN: [id number]  HPI  ROV 01/03/20 --follow-up visit 79 year old woman with a history of obstructive lung disease, CAD with systolic CHF, CKD, severe OSA, microcytic anemia. She has a pulm nodule in LLL identified 08/2019.  She is being managed on Breztri since May 2021.  We performed a split-night sleep study 10/31/2019 that confirmed severe OSA but did not qualify for CPAP titration.  She was since admitted to [facility] long after she was hypoxic following sedation for colonoscopy and endoscopy.  She temporarily required BiPAP.  She was found to have an E. coli urinary tract infection during that hospitalization.  She was discharged on 2 L/min oxygen  ROV 02/18/20 --[name] is 79, follows up today for history of COPD, OSA.  Also with CAD and systolic CHF, CKD and anemia.  She has a left lower lobe pulmonary nodule identified 08/2019.  She is on oxygen at 2 L/min.  I ordered BiPAP for her based on OSA noted on her recent polysomnogram, likely needs a BiPAP titration study.  She is been managed on Breztri, reports that overall she feels better. Less SOB, less cough. We had planned to repeat her CT scan of the chest, not yet done. She is having cough at night.    Review of Systems As per HPI     Objective:   Physical Exam Vitals:   02/18/20 1054  BP: 130/68  Pulse: 85  Temp: 97.7 F (36.5 C)  TempSrc: Temporal  SpO2: 95%  Weight: 244 lb 9.6 oz (110.9 kg)  Height: 5\' 1"  (1.549 m)   Gen: Pleasant, obese, in no distress,  normal affect. Comfortable at rest  ENT: No lesions,  mouth clear,  oropharynx clear but crowded, M4 airway, no postnasal drip  Neck: No JVD, no stridor  Lungs: No use of accessory muscles, small breaths, no crackles or wheezing on normal respiration, no wheeze on forced expiration  Cardiovascular: RRR, heart sounds normal, soft systolic murmur, trace bilateral pretibial peripheral  edema  Musculoskeletal: No deformities, no cyanosis or clubbing  Neuro: alert, awake, non focal  Skin: Warm, no lesions or rash     Assessment & Plan:  Acute on chronic respiratory failure with hypoxia (HCC) Stable at this time on 2 L/min.  Were working on getting her a portable oxygen concentrator.  She was titrated on a pulsed system but there is a back order for devices.  OSA and COPD overlap syndrome ([place])  We will order a BiPAP titration study.  Continue Breztri as you have been taking it Keep albuterol available to use 2 puffs up to every 4 hours if needed for shortness of breath, chest tightness, wheezing.  Follow with Dr. Lamonte Sakai in 3 months or sooner if you have any problems. We will review your BiPAP mask usage at that tim  Solitary pulmonary nodule 6 mm pulmonary nodule planning for repeat CT scan of the chest today.  We will review the results by phone.  Baltazar Apo, MD, PhD 02/18/2020, 12:24 PM [facility] [phone] or if no answer [phone]

## 2020-02-18 NOTE — Patient Instructions (Addendum)
Get your CT chest today as planned to follow  We will work on getting you pulsed-flow oxygen through a portable oxygen concentrator.  We will order a BiPAP titration study.  Continue Breztri as you have been taking it Keep albuterol available to use 2 puffs up to every 4 hours if needed for shortness of breath, chest tightness, wheezing.  Follow with Dr. Lamonte Sakai in 3 months or sooner if you have any problems. We will review your BiPAP mask usage at that time.

## 2020-02-18 NOTE — Assessment & Plan Note (Signed)
Stable at this time on 2 L/min.  Were working on getting her a portable oxygen concentrator.  She was titrated on a pulsed system but there is a back order for devices.

## 2020-02-18 NOTE — Assessment & Plan Note (Signed)
6 mm pulmonary nodule planning for repeat CT scan of the chest today.  We will review the results by phone.

## 2020-02-18 NOTE — Assessment & Plan Note (Signed)
We will order a BiPAP titration study.  Continue Breztri as you have been taking it Keep albuterol available to use 2 puffs up to every 4 hours if needed for shortness of breath, chest tightness, wheezing.  Follow with Dr. Lamonte Sakai in 3 months or sooner if you have any problems. We will review your BiPAP mask usage at that tim

## 2020-02-19 ENCOUNTER — Telehealth: Payer: Self-pay | Admitting: Emergency Medicine

## 2020-02-19 NOTE — Telephone Encounter (Signed)
Spoke with Tessa with [facility] denial dept  She states pt's insurance is denying the in lab sleep study done 10/31/19  She states they are needing [place] to fax a letter stating why we chose in lab over [place]  Please advise, thanks!

## 2020-02-19 NOTE — Telephone Encounter (Signed)
Tessa with [facility] Denial Dept states that pt's insurance will need a letter of medical necessity stating why the sleep lad study was more appropriate than a HST - states claim has been denied and needs Dr. Lamonte Sakai to write this letter. Please fax letter to [facility] @ [phone] - Tessa phone if questions @ [phone]. PLEASE include the pt's account # [id number] - DOS was 10/31/19

## 2020-02-20 ENCOUNTER — Telehealth: Payer: Self-pay | Admitting: Emergency Medicine

## 2020-02-20 NOTE — Telephone Encounter (Signed)
Spoke with the pt  She states that she got a letter stating that her POC was denied  She states we did not do an authorization on this  PCC's can you please advise what we need to do to get this approved?

## 2020-02-21 ENCOUNTER — Encounter: Payer: Self-pay | Admitting: *Deleted

## 2020-02-21 ENCOUNTER — Telehealth: Payer: Self-pay | Admitting: Emergency Medicine

## 2020-02-21 NOTE — Telephone Encounter (Signed)
Janet Choi is on home O2 for COPD, would have needed to do the sleep study on oxygen. I do not believe the home sleep test can be done with patient's on home O2, at least not with the same sensitivity. Further, I believe it was important for this patient, with chronic resp failure and multiple comorbidities, to have her PSG in a setting where she could be monitored in real time. I am ok writing a letter that states these reasons for my decision to do the PSG in the lab

## 2020-02-21 NOTE — Telephone Encounter (Signed)
Sent a message to [employer] to see what is going on with this 02 for this pt she is aware I will call her when I have some answers Joellen [place]

## 2020-02-21 NOTE — Telephone Encounter (Signed)
Dr Lamonte Sakai, please advise on the pt's email, thanks!  For patient Janet Choi, Alferd Apa 07/06/1940. Could Dr. Lamonte Sakai or staff member please  send a brief summary message with layman's terminology for the results of [name]'s chest scan, Feb 18, 2020. She is anxious to know and doesn't understand the radiologist's report :-). THANKS! Ines Bloomer (sister)

## 2020-02-21 NOTE — Telephone Encounter (Signed)
Spoke with the pt  She states her sister dropped off copy of her ct done 02/18/20  I advised we can already see this in our system, so no need for him to have the disc but we appreciate her help  Already email sent to [facility] for the results so will close this encounter

## 2020-02-21 NOTE — Telephone Encounter (Signed)
lmtcb with pt Janet Choi ° °

## 2020-02-21 NOTE — Telephone Encounter (Signed)
Letter done and faxed to [facility]

## 2020-02-21 NOTE — Telephone Encounter (Signed)
Janet Choi's pulmonary nodule is unchanged in appearance compared with 08/2019. Good news. We will plan to repeat her scan in 1 year, 02/2021 to insure that it remains stable.

## 2020-03-04 NOTE — Telephone Encounter (Signed)
Please advise if there is an update on this. Thanks.

## 2020-03-06 NOTE — Telephone Encounter (Signed)
Spoke with the pt and notified of response from [facility]  She verbalized understanding  Nothing further needed

## 2020-03-06 NOTE — Telephone Encounter (Signed)
LMTCB x1 for pt.  

## 2020-03-06 NOTE — Telephone Encounter (Signed)
I just heard from them is morning they are still out and pt is on a wait list Janet Choi

## 2020-03-06 NOTE — Telephone Encounter (Signed)
Pt returning call.  [phone].

## 2020-03-11 DIAGNOSIS — G4733 Obstructive sleep apnea (adult) (pediatric): Secondary | ICD-10-CM | POA: Diagnosis not present

## 2020-03-11 DIAGNOSIS — I5032 Chronic diastolic (congestive) heart failure: Secondary | ICD-10-CM | POA: Diagnosis not present

## 2020-03-11 DIAGNOSIS — I2510 Atherosclerotic heart disease of native coronary artery without angina pectoris: Secondary | ICD-10-CM | POA: Diagnosis not present

## 2020-03-13 DIAGNOSIS — I5032 Chronic diastolic (congestive) heart failure: Secondary | ICD-10-CM | POA: Diagnosis not present

## 2020-03-13 DIAGNOSIS — I2510 Atherosclerotic heart disease of native coronary artery without angina pectoris: Secondary | ICD-10-CM | POA: Diagnosis not present

## 2020-03-25 ENCOUNTER — Other Ambulatory Visit: Payer: Self-pay | Admitting: Internal Medicine

## 2020-03-25 DIAGNOSIS — F3341 Major depressive disorder, recurrent, in partial remission: Secondary | ICD-10-CM

## 2020-04-01 ENCOUNTER — Ambulatory Visit (HOSPITAL_BASED_OUTPATIENT_CLINIC_OR_DEPARTMENT_OTHER): Payer: PPO | Attending: Emergency Medicine | Admitting: Pulmonary Disease

## 2020-04-01 ENCOUNTER — Other Ambulatory Visit: Payer: Self-pay

## 2020-04-01 DIAGNOSIS — G4733 Obstructive sleep apnea (adult) (pediatric): Secondary | ICD-10-CM | POA: Insufficient documentation

## 2020-04-01 DIAGNOSIS — Z79899 Other long term (current) drug therapy: Secondary | ICD-10-CM | POA: Diagnosis not present

## 2020-04-01 DIAGNOSIS — Z794 Long term (current) use of insulin: Secondary | ICD-10-CM | POA: Insufficient documentation

## 2020-04-01 DIAGNOSIS — J449 Chronic obstructive pulmonary disease, unspecified: Secondary | ICD-10-CM | POA: Diagnosis not present

## 2020-04-01 DIAGNOSIS — J9611 Chronic respiratory failure with hypoxia: Secondary | ICD-10-CM | POA: Diagnosis not present

## 2020-04-01 DIAGNOSIS — Z9989 Dependence on other enabling machines and devices: Secondary | ICD-10-CM | POA: Diagnosis not present

## 2020-04-06 NOTE — Progress Notes (Deleted)
FOLLOW UP 3 MONTH   Assessment & Plan:    Acute on chronic respiratory failure with hypoxia (HCC) Improved since admission Continuous 02, 2L [place], continue with benefit. -     CBC with Differential/Platelet  Hypotension due to hypovolemia 118/64 today Continue Toprol-XL for rate D/C Cozaar Taking Lasix 40mg  daily Monitoring B/P and weight  Hyperlipidemia associated with type 2 diabetes mellitus (HCC) Continue medications: Atorvastatin 80mg  Discussed dietary and exercise modifications Low fat diet  Essential hypertension Continue current medications, follow up with cardiology Monitor blood pressure at home; call if consistently over 130/80 or 100/60 or less contact office Continue DASH diet.   Reminder to go to the ER if any CP, SOB, nausea, dizziness, severe HA, changes vision/speech, left arm numbness and tingling and jaw pain.  -     CBC with Differential/Platelet -     COMPLETE METABOLIC PANEL WITH GFR  Morbid obesity due to excess calories (HCC) Discussed dietary and exercise modifications  RLS (restless legs syndrome) Doing well at this time  Type 2 diabetes mellitus with hyperlipidemia (HCC) Continue medications: Continue Victoza, Novolog 20units with breakfast and 25 units with dinner, Basaglar 70units at bedtime.  Discussed scheduling this since patient does not go to sleep at same time. Discussed general issues about diabetes pathophysiology and management. Education: Reviewed 'ABCs' of diabetes management (respective goals in parentheses): A1C (<7), blood pressure (<130/80), and cholesterol (LDL <70) Dietary recommendations Encouraged aerobic exercise.  Discussed foot care, check daily Yearly retinal exam Dental exam every 6 months Monitor blood glucose, discussed goal for patient   OSA and COPD overlap syndrome ([place]) Needs to get CPAP supplies to assist with insomnia.  Acute on chronic diastolic CHF (congestive heart failure)  (HCC) CHF Disease process and medications discussed. Questions answered fully. Emphasized salt restriction, less than 2000mg  a day. Encouraged daily monitoring of the patient's weight, call office if 5 lb weight loss or gain in a day.  Encouraged regular exercise. If any increasing shortness of breath, swelling, or chest pressure go to ER immediately.  decrease your fluid intake to less than 2 L daily please remember to always increase your potassium intake with any increase of your fluid pill.   Gastritis and gastroduodenitis Doing well at this   Acute diastolic heart failure (HCC) CHF Disease process and medications discussed. Questions answered fully. Emphasized salt restriction, less than 2000mg  a day. Encouraged daily monitoring of the patient's weight, call office if 5 lb weight loss or gain in a day.  Encouraged regular exercise. If any increasing shortness of breath, swelling, or chest pressure go to ER immediately.  decrease your fluid intake to less than 2 L daily please remember to always increase your potassium intake with any increase of your fluid pill.   AKI (acute kidney injury) ([place]) -CMP Continue to monitor'  Severe obesity (BMI 35.0-39.9) with comorbidity ([place]) Discussed dietary and exercise modifications  Acute lower UTI / E. coli UTI -     Urinalysis w microscopic + reflex cultur  Medication management Continued  Anemia due to stage 3a chronic kidney disease -     Iron and TIBC; Future -     Ferritin Follow up with hematology  Anxiety Doing well on current regimen Discussed stress management techniques   Discussed good sleep hygiene Discussed increasing physical activity and exercise Increase water intake -     escitalopram (LEXAPRO) 20 MG tablet; Take 1 tablet (20 mg total) by mouth daily.  Pancytopenia (HCC) -     CBC with Differential/Platelet -     COMPLETE METABOLIC PANEL WITH GFR Stopped allopurinol Normal  SPEP Normal elevated retic count, will still refer to hematology for evaluation  Anemia, unspecified type -     CBC with Differential/Platelet -     COMPLETE METABOLIC PANEL WITH GFR Very concerning for GI bleed, increase protonix to BID Check CBC today, if below 8 will send to ER If any worsening symptoms will refer to GI  Abdominal pain, unspecified abdominal location -     CBC with Differential/Platelet -     COMPLETE METABOLIC PANEL WITH GFR  Gastroesophageal reflux disease with esophagitis without hemorrhage -     pantoprazole (PROTONIX) 40 MG tablet; Take 1 tablet (40 mg total) by mouth 2 (two) times daily before a meal. Increase to 2 a day for possible ulcer  Pancreatic insufficiency May need creon or possible CT AB to evaluate pancrease  The patient was advised to call immediately if she has any concerning symptoms in the interval. The patient voices understanding of current treatment options and is in agreement with the current care plan.The patient knows to call the clinic with any problems, questions or concerns or go to the ER if any further progression of symptoms.   Future Appointments  Date Time Provider [place]  04/08/2020  2:30 PM Garnet Sierras, NP [facility] None  04/09/2020 10:00 AM [facility] None  04/09/2020 10:40 AM Brunetta Genera, MD [facility] None  05/14/2020 10:00 AM Garnet Sierras, NP [facility] None  09/15/2020 11:15 AM Liane Comber, NP [facility] None      Subjective:    Patient ID: Janet Choi, female    DOB: 02-20-1941, 79 y.o.   MRN: [id number]  HPI 79 y.o. morbidly obese female with history of [id number] PCI of the LAD and LCx, hypertensive heart disease, OSA, DM, HTN, HLD, morbid obesity, IBS, GERD,and CKDIII presents for follow up for abnormal labs.   She was recently in the hospital for 07/24/19 for chest pain/heaviness. Normal eval from cardio with Leane Call 07/25/19 that was negative.   CXR 07/24/2019, chest CT Has see Dr. Lamonte Sakai and getting work up with him, PFT's pending.   Patient with drop in H/H from 10.4 on 05/25 to now 8.8 with pancytopenia.  SPEP with possible very small band, but final interpretation was negative.   Allopurinol was stopped. Going to see Hematology on 06/10.   She is very tired, some worsening shortness of breath.   She was complaining of intermittent long standing diarrhea for years, intermittent, she continues to have black stool occ and has not been taking pepto or iron. States the diarrhea has improved some.  Treated with protonix 40mg  for possible GERD/LPR/ulcer, only taking once a day.  She is not on iron at this time She is starting to have epigastric pain with food which is new.   Lab Results  Component Value Date   IRON 66 02/08/2020   TIBC 370 02/08/2020   FERRITIN 34 02/08/2020    CBC Latest Ref Rng & Units 02/08/2020 01/08/2020 12/29/2019  WBC 4.0 - 10.5 K/uL 2.4(L) 2.3(L) 1.8(L)  Hemoglobin 12.0 - 15.0 g/dL 9.2(L) 9.3(L) 8.0(L)  Hematocrit 36 - 46 % 29.5(L) 28.7(L) 26.4(L)  Platelets 150 - 400 K/uL 120(L) 151 118(L)   She had a normal pancrease, negative H Pylori.  Recent + fecal stool showing possible chronic pancreatitis from her long standing diabetes.  She states that she does not  have night sweats, no weight loss, no bone pain. She does have easy bruising.   She is scheduled for [place] of AB Wednesday, may need CT scan pending results.    She is overdue for colonoscopy- last one 2010 AB [place] 2007  [name] is her sister, on HIPPA  She has the freestyle [place] and has just started using it.  High has been 170, lowest is 136. States sugars have been high since COVID   Lab Results  Component Value Date   HGBA1C 6.3 (H) 12/25/2019    There were no vitals taken for this visit.  Medications  Current Outpatient Medications (Endocrine & Metabolic):  Marland Kitchen  Insulin Glargine (BASAGLAR KWIKPEN) 100 UNIT/ML SOPN, Inject 70 units Daily  for Diabetes (Patient taking differently: Inject 70 Units into the skin at bedtime. Inject 70 units Daily for Diabetes) .  liraglutide (VICTOZA) 18 MG/3ML SOPN, Inject 0.2 mLs (1.2 mg total) into the skin every morning. Marland Kitchen  NOVOLOG FLEXPEN 100 UNIT/ML FlexPen, INJECT 50 TO 75 UNITS 3 X /DAY BEFORE MEALS AS DIRECTED FOR DIABETES (Patient taking differently: Inject 20-25 Units into the skin 3 (three) times daily before meals. BS<150 Take 20 units BS>150 Take 25 units)  Current Outpatient Medications (Cardiovascular):  .  atorvastatin (LIPITOR) 80 MG tablet, TAKE 1 TABLET (80 MG TOTAL) BY MOUTH DAILY AT 6 PM. .  furosemide (LASIX) 40 MG tablet, Take 40 mg by mouth daily as needed for fluid.  .  metoprolol succinate (TOPROL-XL) 25 MG 24 hr tablet, Take 1 tablet (25 mg total) by mouth at bedtime. .  nitroGLYCERIN (NITROSTAT) 0.4 MG SL tablet, Place 1 tablet (0.4 mg total) under the tongue every 5 (five) minutes x 3 doses as needed for chest pain.  Current Outpatient Medications (Respiratory):  .  albuterol (VENTOLIN HFA) 108 (90 Base) MCG/ACT inhaler, Inhale 2 puffs into the lungs every 4 (four) hours as needed for wheezing or shortness of breath. .  Budeson-Glycopyrrol-Formoterol (BREZTRI AEROSPHERE) 160-9-4.8 MCG/ACT AERO, Inhale 2 puffs into the lungs in the morning and at bedtime. .  triamcinolone (NASACORT) 55 MCG/ACT AERO nasal inhaler, PLACE 2 SPRAYS INTO THE NOSE AT BEDTIME. (Patient taking differently: Place 2 sprays into the nose at bedtime as needed (allergies). )  Current Outpatient Medications (Analgesics):  .  acetaminophen (TYLENOL) 325 MG tablet, Take 2 tablets (650 mg total) by mouth every 4 (four) hours as needed for headache or mild pain. Marland Kitchen  aspirin EC 81 MG EC tablet, Take 1 tablet (81 mg total) by mouth daily.  Current Outpatient Medications (Hematological):  .  ferrous sulfate 325 (65 FE) MG tablet, Take 325 mg by mouth 2 (two) times daily with a meal.  .  vitamin B-12  (CYANOCOBALAMIN) 1000 MCG tablet, Take 1,000 mcg by mouth daily.  Current Outpatient Medications (Other):  Marland Kitchen  buPROPion (WELLBUTRIN XL) 300 MG 24 hr tablet, TAKE 1 TABLET (300 MG TOTAL) BY MOUTH DAILY FOR MOOD .  Cholecalciferol ([employer] VIT D 5000 HIGH-POTENCY) 5000 units capsule, Take 5,000 Units by mouth 2 (two) times daily.  .  Continuous Blood Gluc Receiver (FREESTYLE LIBRE 14 DAY READER) DEVI, 1 Device by Does not apply route every 14 (fourteen) days. .  Continuous Blood Gluc Sensor (FREESTYLE LIBRE 14 DAY SENSOR) MISC, Apply 1 application topically every 14 (fourteen) days. .  diclofenac sodium (VOLTAREN) 1 % GEL, Apply 4 g topically 4 (four) times daily. (Patient taking differently: Apply 4 g topically 4 (four) times daily as needed (pain). ) .  escitalopram (LEXAPRO) 20 MG tablet, Take 1 tablet (20 mg total) by mouth daily. Marland Kitchen  gabapentin (NEURONTIN) 100 MG capsule, Take 1 to 2 capsules 3 to 4 x /day as needed for Painful Diabetic Neuropathy (Patient taking differently: Take 200 mg by mouth 2 (two) times daily. ) .  Insulin Pen Needle (BD PEN NEEDLE NANO U/F) 32G X 4 MM MISC, USE AS DIRECTED 4 TIMES A DAY .  ketoconazole (NIZORAL) 2 % cream, APPLY TO AFFECTED AREA TWICE A DAY (Patient taking differently: Apply 1 application topically daily as needed (itching). ) .  Lancets (ONETOUCH ULTRASOFT) lancets, Use as instructed .  ONETOUCH VERIO test strip, USE AS INSTRUCTED TO CHECK SUGAR 2 TIMES DAILY. .  pantoprazole (PROTONIX) 40 MG tablet, Take 1 tablet (40 mg total) by mouth 2 (two) times daily before a meal. .  Polyethylene Glycol 400 (BLINK TEARS OP), Place 1 drop into both eyes daily as needed (dry eyes).  Marland Kitchen  rOPINIRole (REQUIP) 3 MG tablet, TAKE 1 TABLET 4 TIMES A DAY FOR RESTLESS LEGS .  tamsulosin (FLOMAX) 0.4 MG CAPS capsule, Take 1 capsule (0.4 mg total) by mouth daily. Marland Kitchen  tiZANidine (ZANAFLEX) 4 MG tablet, Take      1 tablet     at Bedtime        if needed for Muscle Spasms  Problem  list She has Hyperlipidemia associated with type 2 diabetes mellitus ([place]); Essential hypertension; Diverticulosis of large intestine; History of colonic polyps; Morbid obesity due to excess calories ([place]); Vitamin D deficiency; Medication management; RLS (restless legs syndrome); Type 2 diabetes mellitus with hyperlipidemia (HCC); OSA and COPD overlap syndrome ([place]); Iron deficiency anemia; History of non-ST elevation myocardial infarction (NSTEMI); CKD stage 3 due to type 2 diabetes mellitus ([place]); CAD S/P percutaneous coronary angioplasty; DJD (degenerative joint disease), lumbosacral; Upper airway cough syndrome; Diabetic polyneuropathy associated with type 2 diabetes mellitus (HCC); DJD (degenerative joint disease) of knee; Acute on chronic diastolic CHF (congestive heart failure) ([place]); Superior mesenteric artery stenosis ([place]); 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 ([place]); Insulin dependent type 2 diabetes mellitus ([place]); Solitary pulmonary nodule; Chronic bronchitis ([place]); Long term (current) use of insulin ([place]); Pancreatic insufficiency; Pancytopenia ([place]); Chronic diarrhea; Anemia; Thrombocytopenia ([place]); Neutropenia ([place]); Diverticulosis of colon without hemorrhage; Polyp of ascending colon; Polyp of sigmoid colon; Tortuous colon; Gastritis and gastroduodenitis; Hiatal hernia; Acute pulmonary edema ([place]); Acute on chronic respiratory failure with hypoxia ([place]); Acute diastolic heart failure ([place]); AKI (acute kidney injury) ([place]); Obesity, Class III, BMI 40-49.9 (morbid obesity) ([place]); Hypotension due to hypovolemia; Acute lower UTI; E. coli UTI; and Acute renal failure (HCC) on their problem list.  Review of Systems See HPI    Objective:   Physical Exam eneral appearance: alert, no distress, WD/WN,  female HEENT: normocephalic, sclerae anicteric, TMs pearly, nares patent, no discharge or erythema, pharynx normal Oral cavity: MMM, no lesions Neck: supple, no lymphadenopathy, no  thyromegaly, no masses Heart: RRR, normal S1, S2, no murmurs, decreased heart sounds due to body habitus Lungs: CTA bilaterally, coarse breath sounds, no wheezes, no rhonchi, or rales Abdomen: +bs, soft, obese, difficult exam due to body habitus, + diffuse tenderness pain epigastric and RUQ tenderness, no rebound, non distended, no masses, + hepatomegaly, no splenomegaly Musculoskeletal: nontender, no swelling, no obvious deformity Extremities: 1-2+ edema, no cyanosis, no clubbing Pulses: 2+ symmetric, upper and lower extremities, normal cap refill Neurological: alert, oriented x 3, CN2-12 intact, strength normal upper extremities and lower extremities, sensation  decreased bilateral legs up to mid shin, DTRs 2+ throughout, no cerebellar signs, gait wide and antalgic Psychiatric: normal affect, behavior normal, pleasant    Garnet Sierras, Laqueta Jean, DNP [facility] 04/06/2020  5:27 PM

## 2020-04-07 DIAGNOSIS — G4733 Obstructive sleep apnea (adult) (pediatric): Secondary | ICD-10-CM | POA: Diagnosis not present

## 2020-04-07 DIAGNOSIS — J449 Chronic obstructive pulmonary disease, unspecified: Secondary | ICD-10-CM

## 2020-04-07 NOTE — Procedures (Signed)
° ° °  Patient Name: Janet Choi, Janet Choi Date: 04/01/2020 Gender: Female D.O.B: August 28, 1940 Age (years): 79 Referring Provider: Baltazar Apo Height (inches): 61 Interpreting Physician: Chesley Mires MD, ABSM Weight (lbs): 248 RPSGT: Zadie Rhine BMI: 47 MRN: [id number] Neck Size: 19.00  CLINICAL INFORMATION The patient is referred for a BiPAP titration to treat sleep apnea. She has history of COPD with chronic hypoxic respiratory failure. She had CPAP titration study December 16, 2019 but still had significant respiratory events with CPAP.  Date of NPSG 10/31/19: AHI 52.1.  SLEEP STUDY TECHNIQUE As per the AASM Manual for the Scoring of Sleep and Associated Events v2.3 (April 2016) with a hypopnea requiring 4% desaturations.  The channels recorded and monitored were frontal, central and occipital EEG, electrooculogram (EOG), submentalis EMG (chin), nasal and oral airflow, thoracic and abdominal wall motion, anterior tibialis EMG, snore microphone, electrocardiogram, and pulse oximetry. Bilevel positive airway pressure (BPAP) was initiated at the beginning of the study and titrated to treat sleep-disordered breathing.  MEDICATIONS Medications self-administered by patient taken the night of the study : LIPITOR, BASAGLAR, GABAPENTIN, PROTONIX, REQUIP, VITAMIN D, FERROUS SULFATE, NASACORT, BASAGLAR KWIKPEN, VICTOZA  RESPIRATORY PARAMETERS Optimal IPAP Pressure (cm): 23 AHI at Optimal Pressure (/hr) 2.0 Optimal EPAP Pressure (cm): 19   Overall Minimal O2 (%): 82.0 Minimal O2 at Optimal Pressure (%): 84.0  Study was conducted with her using baseline 2 liters supplemental oxygen.  SLEEP ARCHITECTURE Start Time: 10:52:09 PM Stop Time: 4:46:45 AM Total Time (min): 354.6 Total Sleep Time (min): 340.6 Sleep Latency (min): 2.5 Sleep Efficiency (%): 96.1% REM Latency (min): 82.0 WASO (min): 11.5 Stage N1 (%): 3.1% Stage N2 (%): 64.2% Stage N3 (%): 0.0% Stage R (%): 32.7 Supine (%): 38.05 Arousal  Index (/hr): 4.6   CARDIAC DATA The 2 lead EKG demonstrated sinus rhythm. The mean heart rate was 75.9 beats per minute. Other EKG findings include: None.  LEG MOVEMENT DATA The total Periodic Limb Movements of Sleep (PLMS) were 0. The PLMS index was 0.0. A PLMS index of <15 is considered normal in adults.  IMPRESSIONS - She did best with Bipap 23/19 cm H2O. - She uses 2 liters supplemental oxygen with Bipap.  DIAGNOSIS - Obstructive sleep apnea with COPD - Chronic hypoxic respiratory failure  RECOMMENDATIONS - Trial of BiPAP therapy on 23/19 cm H2O with 2 liters supplemental oxygen. - She was fitted with a X-Small size Resmed Full Face Mask AirFit F10 for Her mask and heated humidification.  [Electronically signed] 04/07/2020 01:16 PM  Chesley Mires MD, [place], American Board of Sleep Medicine   NPI: [id number]

## 2020-04-08 ENCOUNTER — Ambulatory Visit: Payer: PPO | Admitting: Adult Health Nurse Practitioner

## 2020-04-09 ENCOUNTER — Inpatient Hospital Stay: Payer: PPO | Admitting: Hematology

## 2020-04-09 ENCOUNTER — Inpatient Hospital Stay: Payer: PPO | Attending: Hematology

## 2020-04-09 ENCOUNTER — Inpatient Hospital Stay: Payer: PPO

## 2020-04-09 ENCOUNTER — Other Ambulatory Visit: Payer: Self-pay

## 2020-04-09 DIAGNOSIS — D61818 Other pancytopenia: Secondary | ICD-10-CM | POA: Insufficient documentation

## 2020-04-09 DIAGNOSIS — D649 Anemia, unspecified: Secondary | ICD-10-CM

## 2020-04-09 DIAGNOSIS — I129 Hypertensive chronic kidney disease with stage 1 through stage 4 chronic kidney disease, or unspecified chronic kidney disease: Secondary | ICD-10-CM | POA: Insufficient documentation

## 2020-04-09 DIAGNOSIS — E1122 Type 2 diabetes mellitus with diabetic chronic kidney disease: Secondary | ICD-10-CM | POA: Insufficient documentation

## 2020-04-09 DIAGNOSIS — N183 Chronic kidney disease, stage 3 unspecified: Secondary | ICD-10-CM | POA: Insufficient documentation

## 2020-04-10 DIAGNOSIS — I2510 Atherosclerotic heart disease of native coronary artery without angina pectoris: Secondary | ICD-10-CM | POA: Diagnosis not present

## 2020-04-10 DIAGNOSIS — G4733 Obstructive sleep apnea (adult) (pediatric): Secondary | ICD-10-CM | POA: Diagnosis not present

## 2020-04-10 DIAGNOSIS — I5032 Chronic diastolic (congestive) heart failure: Secondary | ICD-10-CM | POA: Diagnosis not present

## 2020-04-12 DIAGNOSIS — I2510 Atherosclerotic heart disease of native coronary artery without angina pectoris: Secondary | ICD-10-CM | POA: Diagnosis not present

## 2020-04-12 DIAGNOSIS — I5032 Chronic diastolic (congestive) heart failure: Secondary | ICD-10-CM | POA: Diagnosis not present

## 2020-04-15 NOTE — Progress Notes (Signed)
This encounter was created in error - please disregard.

## 2020-04-16 ENCOUNTER — Encounter: Payer: Self-pay | Admitting: Adult Health Nurse Practitioner

## 2020-04-16 ENCOUNTER — Other Ambulatory Visit: Payer: Self-pay

## 2020-04-16 ENCOUNTER — Ambulatory Visit (INDEPENDENT_AMBULATORY_CARE_PROVIDER_SITE_OTHER): Payer: PPO | Admitting: Adult Health Nurse Practitioner

## 2020-04-16 VITALS — BP 142/90 | HR 90 | Temp 97.3°F | Ht 61.0 in | Wt 251.0 lb

## 2020-04-16 DIAGNOSIS — D631 Anemia in chronic kidney disease: Secondary | ICD-10-CM

## 2020-04-16 DIAGNOSIS — E861 Hypovolemia: Secondary | ICD-10-CM

## 2020-04-16 DIAGNOSIS — N390 Urinary tract infection, site not specified: Secondary | ICD-10-CM

## 2020-04-16 DIAGNOSIS — E785 Hyperlipidemia, unspecified: Secondary | ICD-10-CM | POA: Diagnosis not present

## 2020-04-16 DIAGNOSIS — I10 Essential (primary) hypertension: Secondary | ICD-10-CM

## 2020-04-16 DIAGNOSIS — K2990 Gastroduodenitis, unspecified, without bleeding: Secondary | ICD-10-CM

## 2020-04-16 DIAGNOSIS — I5033 Acute on chronic diastolic (congestive) heart failure: Secondary | ICD-10-CM | POA: Diagnosis not present

## 2020-04-16 DIAGNOSIS — J0100 Acute maxillary sinusitis, unspecified: Secondary | ICD-10-CM | POA: Diagnosis not present

## 2020-04-16 DIAGNOSIS — Z79899 Other long term (current) drug therapy: Secondary | ICD-10-CM

## 2020-04-16 DIAGNOSIS — N1831 Chronic kidney disease, stage 3a: Secondary | ICD-10-CM

## 2020-04-16 DIAGNOSIS — J9621 Acute and chronic respiratory failure with hypoxia: Secondary | ICD-10-CM | POA: Diagnosis not present

## 2020-04-16 DIAGNOSIS — G2581 Restless legs syndrome: Secondary | ICD-10-CM | POA: Diagnosis not present

## 2020-04-16 DIAGNOSIS — I9589 Other hypotension: Secondary | ICD-10-CM

## 2020-04-16 DIAGNOSIS — K2970 Gastritis, unspecified, without bleeding: Secondary | ICD-10-CM

## 2020-04-16 DIAGNOSIS — G4733 Obstructive sleep apnea (adult) (pediatric): Secondary | ICD-10-CM | POA: Diagnosis not present

## 2020-04-16 DIAGNOSIS — J449 Chronic obstructive pulmonary disease, unspecified: Secondary | ICD-10-CM

## 2020-04-16 DIAGNOSIS — E1169 Type 2 diabetes mellitus with other specified complication: Secondary | ICD-10-CM | POA: Diagnosis not present

## 2020-04-16 DIAGNOSIS — F419 Anxiety disorder, unspecified: Secondary | ICD-10-CM

## 2020-04-16 MED ORDER — VICTOZA 18 MG/3ML ~~LOC~~ SOPN: 1.2000 mg | PEN_INJECTOR | Freq: Every morning | SUBCUTANEOUS | 3 refills | Status: DC

## 2020-04-16 MED ORDER — TRIAMCINOLONE ACETONIDE 55 MCG/ACT NA AERO: 2.0000 | INHALATION_SPRAY | Freq: Every day | NASAL | 3 refills | Status: AC

## 2020-04-16 NOTE — Patient Instructions (Addendum)
Start using Nasacort every evening.   Take Cetirizine (Zyrtec) every night to help dry up fluid in your sinuses.  You can try Trelegy, this is a once a day inhaler.  Rinse your mouth AND brush teeth after use.  This is along acting medication.  Use the albuterol is you have any shortness of breath or wheezing.   Here are some simple exercises I like you can do from a chair.  This is great if you are not able to get outdoors or restricted by your breathing.  Step-By-Step Chair Exercises  To maximize your chair exercise routine, you'll need a set of small dumbbells weighing anywhere from 5 to 8 pounds each, as well as a resistance band. Start your routine by doing a couple of exercises, then gradually add more to your routine. Repeat each movement 3 to 10 times.     1. Sitting Side Tilt  This is a great exercise to begin with because it warms up the body while improving core strength. Sit up straight in your chair and tilt your upper body as far as you can to the left, return to center, then repeat the exercise on your right.        2. Back Archer  The Back Fernande Boyden will help to improve posture and open up your chest. Clasp your hands behind your back. Arch your back and push your hands away from you. Hold for 1-3 seconds, then release your hands and relax your back. Repeat.     3. Shoulder Arcs  This exercise strengthens the shoulders and works the rotator cuffs, which tend to be a problem area for older adults as those muscles are used less often. Hold a weight in each hand, palms facing in. Move your arms forward and up to shoulder level, then lower. You can also try moving your arms out to the side and raising them to shoulder height, then lowering them. As you build strength, raise your arms as high as possible over your head before lowering them. Repeat.      4. Toe Up  The Toe Up strengthens your calves. Press toes into the floor to raise your knees, then lower and press  heels into the floor. Repeat.       5. Sitting Quad Set  If you spend a significant amount of time in the sitting position, it is not uncommon to see muscle mass loss in your legs. To prevent muscle loss, the Sitting Quad Set will help keep your muscles strong and active. Raise your left foot to straighten your leg, tightening the muscle in your thigh. Hold for 10-30 seconds, then lower. Repeat with your right leg.     6. Chair Push-up  Chair Push-ups can greatly improve stability and strengthen your core. Though this exercise is slightly more advanced, you can work up to it with time. Grip the arms of the chair and press down to push your buttocks up. Lower yourself back into the chair. Repeat.     7. Side Leg Kick  For improved balance and strengthening your legs, try the Side Leg Kick exercise. Stand up, holding the back of the chair for balance, and raise your left leg out to the side. Lower your left leg, then repeat with your right leg.    8. Leg Swing  The Leg Swing is another exercise that helps improve your balance and strengthen the legs. Stand behind your chair, holding the top for balance, and lift your left leg  out in front of you. Swing your left leg out behind you, then bring back to the middle. Repeat with your right leg.     9. Ankle Bend  This exercise primarily works to improve your balance. Holding your chair for support, press your left heel into the floor and gently bend your ankle and raise your toes, keeping your body straight throughout the exercise. Lower your toes and repeat with right leg.    If you are just getting started with chair exercises, avoid injury by having another person there for support. They can help make sure you are using good form for each exercise. Listen to your body and start out slowly in the beginning.  Do what you can, start with two and slowly add exercise every 3-4 days.  This will help to work your muscles to increase stability  and bone health.   GENERAL HEALTH GOALS  Know what a healthy weight is for you (roughly BMI <25) and aim to maintain this  Aim for 7+ servings of fruits and vegetables daily  70-80+ fluid ounces of water or unsweet tea for healthy kidneys  Limit to max 1 drink of alcohol per day; avoid smoking/tobacco  Limit animal fats in diet for cholesterol and heart health - choose grass fed whenever available  Avoid highly processed foods, and foods high in saturated/trans fats  Aim for low stress - take time to unwind and care for your mental health  Aim for 150 min of moderate intensity exercise weekly for heart health, and weights twice weekly for bone health  Aim for 7-9 hours of sleep daily

## 2020-04-16 NOTE — Progress Notes (Signed)
Hospital follow up  Assessment and Plan: Hospital visit follow up for hypoxia.  Janet Choi was seen today for hospitalization follow-up.  Diagnoses and all orders for this visit:  Acute on chronic respiratory failure with hypoxia ([place]) Improved since admission Continuous 02, 2L [place], continue with benefit. -     CBC with Differential/Platelet  Hypotension due to hypovolemia 118/64 today Continue Toprol-XL for rate D/C Cozaar Taking Lasix 40mg  daily Monitoring B/P and weight  Hyperlipidemia associated with type 2 diabetes mellitus (HCC) Continue medications: Atorvastatin 80mg  Discussed dietary and exercise modifications Low fat diet  Essential hypertension Continue current medications, follow up with cardiology Monitor blood pressure at home; call if consistently over 130/80 or 100/60 or less contact office Continue DASH diet.   Reminder to go to the ER if any CP, SOB, nausea, dizziness, severe HA, changes vision/speech, left arm numbness and tingling and jaw pain.  -     CBC with Differential/Platelet -     COMPLETE METABOLIC PANEL WITH GFR  Morbid obesity due to excess calories (HCC) Discussed dietary and exercise modifications  RLS (restless legs syndrome) Doing well at this time  Type 2 diabetes mellitus with hyperlipidemia (HCC) Continue medications: Continue Victoza, Novolog 20units with breakfast and 25 units with dinner, Basaglar 70units at bedtime.  Discussed scheduling this since patient does not go to sleep at same time. Discussed general issues about diabetes pathophysiology and management. Education: Reviewed 'ABCs' of diabetes management (respective goals in parentheses):  A1C (<7), blood pressure (<130/80), and cholesterol (LDL <70) Dietary recommendations Encouraged aerobic exercise.  Discussed foot care, check daily Yearly retinal exam Dental exam every 6 months Monitor blood glucose, discussed goal for patient   OSA and COPD overlap syndrome ([place]) Needs  to get CPAP supplies to assist with insomnia.  Acute on chronic diastolic CHF (congestive heart failure) (HCC) CHF Disease process and medications discussed. Questions answered fully. Emphasized salt restriction, less than 2000mg  a day. Encouraged daily monitoring of the patient's weight, call office if 5 lb weight loss or gain in a day.  Encouraged regular exercise. If any increasing shortness of breath, swelling, or chest pressure go to ER immediately.  decrease your fluid intake to less than 2 L daily please remember to always increase your potassium intake with any increase of your fluid pill.   Gastritis and gastroduodenitis Doing well at this   Acute diastolic heart failure (HCC) CHF Disease process and medications discussed. Questions answered fully. Emphasized salt restriction, less than 2000mg  a day. Encouraged daily monitoring of the patient's weight, call office if 5 lb weight loss or gain in a day.  Encouraged regular exercise. If any increasing shortness of breath, swelling, or chest pressure go to ER immediately.  decrease your fluid intake to less than 2 L daily please remember to always increase your potassium intake with any increase of your fluid pill.   AKI (acute kidney injury) ([place]) -CMP Continue to monitor'  Severe obesity (BMI 35.0-39.9) with comorbidity ([place]) Discussed dietary and exercise modifications  Acute lower UTI / E. 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  Other orders -     REFLEXIVE URINE  CULTURE     All medications were reviewed with patient and family and fully reconciled. All questions answered fully, and patient and family members were encouraged to call the office with any further questions or concerns. Discussed goal to avoid readmission related to this diagnosis.  There are no discontinued medications.  Over 40 minutes of face to face interview, exam, counseling, chart review, and complex, high/moderate level critical decision making was performed this visit.   Future Appointments  Date Time Provider [place]  04/17/2020  1:15 PM [facility] None  04/17/2020  1:40 PM Brunetta Genera, MD [facility] None  05/14/2020 10:00 AM Garnet Sierras, NP [facility] None  09/15/2020 11:15 AM Liane Comber, NP [facility] None     HPI 79 y.o.female presents for follow up for 3 month follow up.   She is seeing Dr Janet Choi, Pulmonology.  She is on continuous O2 via Nasal Cannula.  She is not using daily inhaler Discussed this at length with patient. She has tried Librarian, academic in the past and reports sore in the mouth as a side effect despite rinsing mouth after use. She has been playing cards again and she has started wup with crafts again which she enjoys.    Reports that her friend Janet Choi does help her with her work around the house as she tires out quickly.  Admit date to the hospital was 12/25/19, patient was discharged from the hospital on 12/29/19 and our clinical staff contacted the office the day after discharge to set up a follow up appointment. The discharge summary, medications, and diagnostic test results were reviewed before meeting with the patient. The patient was admitted for: Acute on chronic respiratory failure  She was have colonoscopy and endoscopy for pancytopenia at [facility] when she became hypoxic and placed on BiPAP.  chest x-ray was done that shows pleural effusions cardiomegaly and bilateral interstitial infiltrates, she had a CT scan  on 12/06/2019 that showed no effusions and no pulmonary edema. She has follow up schedule with Cardiology Dr. Oval Choi. Last Echo 01/07/20 EF 60-65%. Hematology/oncology, Dr [name] on 02/08/20  Urology, Dr Roni Bread, another kidney stone was found on CT, asymptomatic.  Pulmonology Dr Janet Choi on 01/03/20.  Plan to repeat CT on 02/2020.  Continue Breztri and 2L 02  Since hospital admission she continues to feel fatigued.  She is wearing 2L portable 02.  Today she reports she is doing much better since hospitalization.   She has history of DMII.  She reports her last bloog glucose was 155.    From Hospital admission: HPI: NEREIDA SCHEPP is a 79 y.o. 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 who was getting a colonoscopy and endoscopy for pancytopenia at the [facility] long endoscopy suite when she started getting hypoxic, was placed on a BiPAP and her saturations improved, chest x-ray was done that shows pleural effusions cardiomegaly and bilateral interstitial infiltrates, she had a CT scan on 12/06/2019 that showed no effusions and no pulmonary edema so we are consulted for further evaluation and admission, I have asked them to transfer the patient to the emergency room as we do not have any beds and we have multiple patients waiting at [facility] long ED  and she needs a closer care, will continue her on BiPAP.  Assessment/Plan Acute respiratory failure with hypoxia due to acute pulmonary edema secondary to acute diastolic heart failure: Her volume status is hard to evaluate as she is morbidly obese, she does have a chest x-ray that shows pulmonary edema and bilateral pleural effusions compared from a CT scan of the abdomen pelvis on 12/06/2019 that showed no pleural effusions and no pulmonary edema dizziness most likely cause, she relates she has  not been taking her Lasix regularly. We will place her on BiPAP we will start her on IV Lasix twice a day monitor electrolytes and replete as needed. We will keep her on a low-sodium diet restrict her fluids Daily weights strict I's and O's. We will need to check a CBC, BMP and complete metabolic panel stat.  Discharge Diagnoses:  Principal Problem:   Acute on chronic respiratory failure with hypoxia (HCC) Active Problems:   Hypotension due to hypovolemia   Hyperlipidemia associated with type 2 diabetes mellitus ([place])   Essential hypertension   Morbid obesity due to excess calories (HCC)   RLS (restless legs syndrome)   Type 2 diabetes mellitus with hyperlipidemia (HCC)   OSA and COPD overlap syndrome (HCC)   Acute on chronic diastolic CHF (congestive heart failure) (HCC)   Gastritis and gastroduodenitis   Acute diastolic heart failure (HCC)   AKI (acute kidney injury) ([place])   Obesity, Class III, BMI 40-49.9 (morbid obesity) ([place])   Acute lower UTI   E. coli UTI  Home health is not involved.   Images while in the hospital: DG CHEST PORT 1 VIEW  Result Date: 12/26/2019 CLINICAL DATA:  Hypoxia. EXAM: PORTABLE CHEST 1 VIEW COMPARISON:  Radiograph yesterday.  CT 06/30/2019 FINDINGS: Cardiomegaly is stable. Unchanged mediastinal contours. Improving interstitial opacities from prior exam. No confluent airspace disease. No pneumothorax. No large pleural effusion. Degenerative change of both shoulders. IMPRESSION: Stable cardiomegaly. Improving interstitial opacities from prior exam may represent pulmonary edema or infection. No new abnormality. Electronically Signed   By: Keith Rake M.D.   On: 12/26/2019 20:30   DG CHEST PORT 1 VIEW  Result Date: 12/25/2019 CLINICAL DATA:  Shortness of breath. EXAM: PORTABLE CHEST 1 VIEW COMPARISON:  CT 08/28/2019.  Chest x-ray 07/24/2019. FINDINGS: Cardiomegaly. Diffuse bilateral pulmonary infiltrates/edema. Small bilateral pleural effusions. No  pneumothorax. Thoracic spine scoliosis and degenerative change. IMPRESSION: 1.  Cardiomegaly. 2. Diffuse bilateral pulmonary infiltrates/edema. Small bilateral pleural effusions. Electronically Signed   By: Marcello Moores  Register   On: 12/25/2019 13:26     Current Outpatient Medications (Endocrine & Metabolic):  Marland Kitchen  Insulin Glargine (BASAGLAR KWIKPEN) 100 UNIT/ML SOPN, Inject 70 units Daily for Diabetes (Patient taking differently: Inject 70 Units into the skin at bedtime. Inject 70 units Daily for Diabetes) .  liraglutide (VICTOZA) 18 MG/3ML SOPN, Inject 0.2 mLs (1.2 mg total) into the skin every morning. Marland Kitchen  NOVOLOG FLEXPEN 100 UNIT/ML FlexPen, INJECT 50 TO 75 UNITS 3 X /DAY BEFORE MEALS AS DIRECTED FOR DIABETES (Patient taking differently: Inject 20-25 Units into the skin 3 (three) times daily before meals. BS<150 Take 20 units BS>150 Take 25 units)  Current Outpatient Medications (Cardiovascular):  .  atorvastatin (LIPITOR) 80 MG tablet, TAKE 1 TABLET (80 MG TOTAL) BY MOUTH DAILY AT 6 PM. .  furosemide (LASIX) 40 MG tablet, Take 40 mg by mouth daily as needed for fluid.  .  metoprolol succinate (TOPROL-XL) 25 MG 24 hr tablet, Take 1 tablet (25 mg total) by  mouth at bedtime. .  nitroGLYCERIN (NITROSTAT) 0.4 MG SL tablet, Place 1 tablet (0.4 mg total) under the tongue every 5 (five) minutes x 3 doses as needed for chest pain.  Current Outpatient Medications (Respiratory):  .  albuterol (VENTOLIN HFA) 108 (90 Base) MCG/ACT inhaler, Inhale 2 puffs into the lungs every 4 (four) hours as needed for wheezing or shortness of breath. .  Budeson-Glycopyrrol-Formoterol (BREZTRI AEROSPHERE) 160-9-4.8 MCG/ACT AERO, Inhale 2 puffs into the lungs in the morning and at bedtime. .  triamcinolone (NASACORT) 55 MCG/ACT AERO nasal inhaler, PLACE 2 SPRAYS INTO THE NOSE AT BEDTIME. (Patient taking differently: Place 2 sprays into the nose at bedtime as needed (allergies). )  Current Outpatient Medications (Analgesics):   .  acetaminophen (TYLENOL) 325 MG tablet, Take 2 tablets (650 mg total) by mouth every 4 (four) hours as needed for headache or mild pain. Marland Kitchen  aspirin EC 81 MG EC tablet, Take 1 tablet (81 mg total) by mouth daily.  Current Outpatient Medications (Hematological):  .  ferrous sulfate 325 (65 FE) MG tablet, Take 325 mg by mouth 2 (two) times daily with a meal.  .  vitamin B-12 (CYANOCOBALAMIN) 1000 MCG tablet, Take 1,000 mcg by mouth daily.  Current Outpatient Medications (Other):  Marland Kitchen  buPROPion (WELLBUTRIN XL) 300 MG 24 hr tablet, TAKE 1 TABLET (300 MG TOTAL) BY MOUTH DAILY FOR MOOD .  Cholecalciferol ([employer] VIT D 5000 HIGH-POTENCY) 5000 units capsule, Take 5,000 Units by mouth 2 (two) times daily.  .  Continuous Blood Gluc Receiver (FREESTYLE LIBRE 14 DAY READER) DEVI, 1 Device by Does not apply route every 14 (fourteen) days. .  Continuous Blood Gluc Sensor (FREESTYLE LIBRE 14 DAY SENSOR) MISC, Apply 1 application topically every 14 (fourteen) days. .  diclofenac sodium (VOLTAREN) 1 % GEL, Apply 4 g topically 4 (four) times daily. (Patient taking differently: Apply 4 g topically 4 (four) times daily as needed (pain). ) .  escitalopram (LEXAPRO) 20 MG tablet, Take 1 tablet (20 mg total) by mouth daily. Marland Kitchen  gabapentin (NEURONTIN) 100 MG capsule, Take 1 to 2 capsules 3 to 4 x /day as needed for Painful Diabetic Neuropathy (Patient taking differently: Take 200 mg by mouth 2 (two) times daily. ) .  Insulin Pen Needle (BD PEN NEEDLE NANO U/F) 32G X 4 MM MISC, USE AS DIRECTED 4 TIMES A DAY .  ketoconazole (NIZORAL) 2 % cream, APPLY TO AFFECTED AREA TWICE A DAY (Patient taking differently: Apply 1 application topically daily as needed (itching). ) .  Lancets (ONETOUCH ULTRASOFT) lancets, Use as instructed .  ONETOUCH VERIO test strip, USE AS INSTRUCTED TO CHECK SUGAR 2 TIMES DAILY. .  pantoprazole (PROTONIX) 40 MG tablet, Take 1 tablet (40 mg total) by mouth 2 (two) times daily before a meal. .   Polyethylene Glycol 400 (BLINK TEARS OP), Place 1 drop into both eyes daily as needed (dry eyes).  Marland Kitchen  rOPINIRole (REQUIP) 3 MG tablet, TAKE 1 TABLET 4 TIMES A DAY FOR RESTLESS LEGS .  tamsulosin (FLOMAX) 0.4 MG CAPS capsule, Take 1 capsule (0.4 mg total) by mouth daily. Marland Kitchen  tiZANidine (ZANAFLEX) 4 MG tablet, Take      1 tablet     at Bedtime        if needed for Muscle Spasms  Past Medical History:  Diagnosis Date  . Anemia    hx (04/14/2016)  . Anxiety   . Arthritis    "severe in my back; hands; ankles" (04/14/2016)  .  Atypical chest pain 01/24/2018   Midline pain absent supine "constant" daytime since 12/31/17 > resolved as of 02/20/2018 on gerd/ gas diet   . Basal cell carcinoma    "several burned off; one cut off"  . CAD in native artery    a. NSTEMI 04/2016 - s/p DES toLAD and LCx. PCI to LCx notable for microembolization during cath.  . Chest pain- reslved with stopping Brilinta now on Plaix 04/16/2016  . Chronic lower back pain   . CKD (chronic kidney disease), stage III (HCC)    stage 3  . Complication of anesthesia   . COPD (chronic obstructive pulmonary disease) ([place])   . Depression   . Diverticulosis   . DJD (degenerative joint disease)   . Family history of adverse reaction to anesthesia   . Gallstones   . GERD (gastroesophageal reflux disease)   . History of gout   . History of kidney stones   . Hyperlipidemia   . Hypertension   . IBS (irritable bowel syndrome)   . Ischemic cardiomyopathy    a. 04/2016: EF 40-50% by cath, 50-55% +WMA by echo.  . 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 ([place]) ~ 2015  . Morbid obesity ([place])   . NSTEMI (non-ST elevated myocardial infarction) ([place]) 04/14/2016  . Obstructive sleep apnea of adult    appt for cpap 8-8 to adjust settings no cpap at this time  . On supplemental oxygen therapy    uses at bedtime or exposed to heat, 2Liters  . Peripheral neuropathy    feet and toes  . Peripheral vascular disease ([place])   . RLS (restless legs  syndrome)   . Spinal stenosis   . Type II diabetes mellitus ([place])   . Vitamin D deficiency   . Walking pneumonia   . Wears partial dentures    lower     Allergies  Allergen Reactions  . Brilinta [Ticagrelor] Shortness Of Breath and Other (See Comments)    Chest pain (also)  . Aspirin Other (See Comments)    Can tolerate in small doses (is already on a blood thinner AND has kidney disease)  . Nsaids Other (See Comments)    Patient is taking a blood thinner and has kidney disease  . Minocycline Hcl Other (See Comments)    Welts  . Oruvail [Ketoprofen] Other (See Comments)    Has kidney disease and is taking a blood thinner  . Tricor [Fenofibrate]     Reaction unknown  . Vasotec [Enalaprilat]     Reaction unknown  . Zinc     Reaction unknown    ROS: all negative except above.   Physical Exam: Filed Weights   04/16/20 1540  Weight: 251 lb (113.9 kg)   BP (!) 142/90   Pulse 90   Temp (!) 97.3 F (36.3 C)   Ht 5\' 1"  (1.549 m)   Wt 251 lb (113.9 kg)   SpO2 90% Comment: on 2L of O2  BMI 47.43 kg/m  General Appearance: Well nourished, in no apparent distress. Eyes: PERRLA, EOMs, conjunctiva no swelling or erythema Sinuses: No Frontal/maxillary tenderness ENT/Mouth: Ext aud canals clear, TMs without erythema, bulging. No erythema, swelling, or exudate on post pharynx.  Tonsils not swollen or erythematous. Hearing normal.  Neck: Supple, thyroid normal.  Respiratory: Respiratory effort normal, BS equal bilaterally without rales, rhonchi, wheezing or stridor.  Cardio: RRR with no MRGs. Brisk peripheral pulses without edema.  Abdomen: Soft, + BS.  Non tender, no guarding, rebound, hernias,  masses. Lymphatics: Non tender without lymphadenopathy.  Musculoskeletal: Full ROM, 5/5 strength, normal gait.  Skin: Warm, dry without rashes, lesions, ecchymosis.  Neuro: Cranial nerves intact. Normal muscle tone, no cerebellar symptoms. Sensation intact.  Psych: Awake and oriented X 3,  normal affect, Insight and Judgment appropriate.     Garnet Sierras, NP 4:16 PM [facility]

## 2020-04-17 ENCOUNTER — Other Ambulatory Visit: Payer: Self-pay | Admitting: *Deleted

## 2020-04-17 ENCOUNTER — Other Ambulatory Visit: Payer: Self-pay

## 2020-04-17 ENCOUNTER — Inpatient Hospital Stay: Payer: PPO

## 2020-04-17 ENCOUNTER — Inpatient Hospital Stay (HOSPITAL_BASED_OUTPATIENT_CLINIC_OR_DEPARTMENT_OTHER): Payer: PPO | Admitting: Hematology

## 2020-04-17 VITALS — BP 140/59 | HR 74 | Temp 98.9°F | Resp 18

## 2020-04-17 DIAGNOSIS — D508 Other iron deficiency anemias: Secondary | ICD-10-CM

## 2020-04-17 DIAGNOSIS — D61818 Other pancytopenia: Secondary | ICD-10-CM | POA: Diagnosis not present

## 2020-04-17 DIAGNOSIS — N183 Chronic kidney disease, stage 3 unspecified: Secondary | ICD-10-CM | POA: Diagnosis not present

## 2020-04-17 DIAGNOSIS — I129 Hypertensive chronic kidney disease with stage 1 through stage 4 chronic kidney disease, or unspecified chronic kidney disease: Secondary | ICD-10-CM | POA: Diagnosis not present

## 2020-04-17 DIAGNOSIS — D696 Thrombocytopenia, unspecified: Secondary | ICD-10-CM

## 2020-04-17 DIAGNOSIS — D649 Anemia, unspecified: Secondary | ICD-10-CM

## 2020-04-17 DIAGNOSIS — E1122 Type 2 diabetes mellitus with diabetic chronic kidney disease: Secondary | ICD-10-CM | POA: Diagnosis not present

## 2020-04-17 LAB — COMPLETE METABOLIC PANEL WITH GFR
AG Ratio: 1.9 (calc) (ref 1.0–2.5)
ALT: 6 U/L (ref 6–29)
AST: 9 U/L — ABNORMAL LOW (ref 10–35)
Albumin: 4.2 g/dL (ref 3.6–5.1)
Alkaline phosphatase (APISO): 71 U/L (ref 37–153)
BUN/Creatinine Ratio: 18 (calc) (ref 6–22)
BUN: 21 mg/dL (ref 7–25)
CO2: 32 mmol/L (ref 20–32)
Calcium: 9.2 mg/dL (ref 8.6–10.4)
Chloride: 108 mmol/L (ref 98–110)
Creat: 1.14 mg/dL — ABNORMAL HIGH (ref 0.60–0.93)
GFR, Est African American: 53 mL/min/{1.73_m2} — ABNORMAL LOW (ref >=60)
GFR, Est Non African American: 46 mL/min/{1.73_m2} — ABNORMAL LOW (ref >=60)
Globulin: 2.2 g/dL (calc) (ref 1.9–3.7)
Glucose, Bld: 68 mg/dL (ref 65–99)
Potassium: 4.3 mmol/L (ref 3.5–5.3)
Sodium: 145 mmol/L (ref 135–146)
Total Bilirubin: 0.4 mg/dL (ref 0.2–1.2)
Total Protein: 6.4 g/dL (ref 6.1–8.1)

## 2020-04-17 LAB — LIPID PANEL
Cholesterol: 136 mg/dL (ref <200)
HDL: 39 mg/dL — ABNORMAL LOW (ref >=50)
LDL Cholesterol (Calc): 70 mg/dL (calc)
Non-HDL Cholesterol (Calc): 97 mg/dL (calc) (ref <130)
Total CHOL/HDL Ratio: 3.5 (calc) (ref <5.0)
Triglycerides: 194 mg/dL — ABNORMAL HIGH (ref <150)

## 2020-04-17 LAB — CBC WITH DIFFERENTIAL/PLATELET
Absolute Monocytes: 59 cells/uL — ABNORMAL LOW (ref 200–950)
Basophils Absolute: 0 cells/uL (ref 0–200)
Basophils Relative: 0 %
Eosinophils Absolute: 99 cells/uL (ref 15–500)
Eosinophils Relative: 5.5 %
HCT: 28.2 % — ABNORMAL LOW (ref 35.0–45.0)
Hemoglobin: 9.3 g/dL — ABNORMAL LOW (ref 11.7–15.5)
Lymphs Abs: 806 cells/uL — ABNORMAL LOW (ref 850–3900)
MCH: 32.9 pg (ref 27.0–33.0)
MCHC: 33 g/dL (ref 32.0–36.0)
MCV: 99.6 fL (ref 80.0–100.0)
MPV: 10.3 fL (ref 7.5–12.5)
Monocytes Relative: 3.3 %
Neutro Abs: 835 cells/uL — ABNORMAL LOW (ref 1500–7800)
Neutrophils Relative %: 46.4 %
Platelets: 128 10*3/uL — ABNORMAL LOW (ref 140–400)
RBC: 2.83 10*6/uL — ABNORMAL LOW (ref 3.80–5.10)
RDW: 13.4 % (ref 11.0–15.0)
Total Lymphocyte: 44.8 %
WBC: 1.8 10*3/uL — ABNORMAL LOW (ref 3.8–10.8)

## 2020-04-17 LAB — VITAMIN B12: Vitamin B-12: 1450 pg/mL — ABNORMAL HIGH (ref 180–914)

## 2020-04-17 LAB — HEMOGLOBIN A1C
Hgb A1c MFr Bld: 6.2 % of total Hgb — ABNORMAL HIGH (ref <5.7)
Mean Plasma Glucose: 131 mg/dL
eAG (mmol/L): 7.3 mmol/L

## 2020-04-17 LAB — IRON AND TIBC
Iron: 83 ug/dL (ref 41–142)
Saturation Ratios: 21 % (ref 21–57)
TIBC: 388 ug/dL (ref 236–444)
UIBC: 304 ug/dL (ref 120–384)

## 2020-04-17 LAB — FERRITIN: Ferritin: 19 ng/mL (ref 11–307)

## 2020-04-17 NOTE — Progress Notes (Signed)
HEMATOLOGY/ONCOLOGY CLINIC NOTE  Date of Service: 04/17/2020  Patient Care Team: [name], MD as PCP - General (Internal Medicine) Skeet Latch, MD as PCP - Cardiology (Cardiology) Marygrace Drought, MD as Consulting Physician (Ophthalmology) Larey Dresser, MD as Consulting Physician (Cardiology) Nobie Putnam, MD as Consulting Physician (Oncology) Tania Ade, MD as Consulting Physician (Orthopedic Surgery) Danis, Kirke Corin, MD as Consulting Physician (Gastroenterology)  REFERRING PHYSICIAN: [name], MD  CHIEF COMPLAINTS/PURPOSE OF CONSULTATION:  Pancytopenia  HISTORY OF PRESENTING ILLNESS:  Janet Choi is a wonderful 79 y.o. female who has been referred to [place] by [name], MD for evaluation and management of pancytopenia. Pt is accompanied today by her sister, [name]. The pt reports that she is doing well overall.   The pt reports she is good. She has acute inflammation in the gallbladder and it bothers her when she eats. Pt has chronic diarrhea and has been diagnosed with sleep apnea. She was given a sleep apnea machine about 7 years ago but did not use it. Pt's diabetes has not been well controlled recently. She is also on aspirin since her heart attack. Pt has been on Requip for several years now. She also has been on gabapentin for neuropathy for several years. Pt has starting taking iron supplement this week. She was recently placed on protonix and taken off of $Remo'100mg'[id number]$  of allopurinol. There is no liver or thyroid problems that she knows of.   She felt really bad after the second dose of the COVID19 vaccine and stayed in for 14 days back in April. She was really sick and her arm was red and swollen where the shot was administered. At the beginning of the year the pt had cellulitis and was on antibiotics for more than 3 weeks.   Pt has never had blood transfusions or IV iron. She has not had a gout attack in several years.   Of note prior to  the patient's visit today, pt has had US Abdomen Complete ([id number]) completed on 10/17/19 with results revealing "Cholelithiasis with positive sonographic Murphy's sign and mild wall thickening these changes would be consistent with acute cholecystitis in the appropriate clinical setting. HIDA scan may be helpful for further evaluation. Left renal calculi without obstructive changes. Left renal cyst."  Most recent lab results (10/15/19) of CBC is as follows: all values are WNL except for WBC at 2.5K, RBC at 2.65, Hemoglobin at 8.7, HCT at 26.5, Neutro Abs at 1353, Absolute Monocytes at 133, Glucose at 107, Creat at 1.30, GFR, Est Non Af Am at 39, GFR, Est AFR Am at 46, Total Protein at 5.8, AST at 8  On review of systems, pt reports SOB, chronic diarrhea, sleep apnea, fatigue, abdominal tenderness and denies infections, black/blood in stool, fever, chills, night sweats, unexpected weight loss, mouth soars, and any other symptoms.   On PMHx the pt reports chronic diarrhea, sleep apnea, diabetes, myocardial infarction, Vitamin B12 deficient, neuropathy, cellulitis  On Social Hx the pt reports no alcohol use   INTERVAL HISTORY: Janet Choi is a wonderful 79 y.o. female who is here for evaluation and management of pancytopenia. The patient's last visit with [place] was on 02/08/2020. The pt reports that she is doing well overall.  The pt reports fatigue despite taking Vitamin B12 and OTC slow-release iron daily. Pt denies any recent infection symptoms. Her only recent medication change is the addition of an allergy medicine. Pt has been drinking more water and has increased  her overall liquid intake. She endorses recurrence of ice cravings.   Pt recently had a sleep study and will be receiving a new CPAP machine. Her oxygen needs have been steady and she is using 2 liters per min.   Lab results (04/16/20) of CBC w/diff and CMP is as follows: all values are WNL except for WBC at 1.8K, RBC at 2.83,  Hgb at 9.3, HCT at 28.2, PLT at 128K, Neutro Abs at 835, Lymphs Abs at 806, Mono Abs at 59, Creatinine at 1.14, GFR Est Non Af Am at 46, AST at 9. 04/17/2020 Ferritin at 19 04/17/2020 Vitamin B12 at 1450 04/17/2020 Iron Panel is as follows: Iron at 83, TIBC at 388, Sat Ratios at 21, UIBC at 304  On review of systems, pt reports fatigue, ice cravings, leg swelling and denies cough, rash, bone pain, fevers, chills, night sweats, abdominal pain and any other symptoms.    MEDICAL HISTORY:  Past Medical History:  Diagnosis Date  . Anemia    hx (04/14/2016)  . Anxiety   . Arthritis    "severe in my back; hands; ankles" (04/14/2016)  . Atypical chest pain 01/24/2018   Midline pain absent supine "constant" daytime since 12/31/17 > resolved as of 02/20/2018 on gerd/ gas diet   . Basal cell carcinoma    "several burned off; one cut off"  . CAD in native artery    a. NSTEMI 04/2016 - s/p DES toLAD and LCx. PCI to LCx notable for microembolization during cath.  . Chest pain- reslved with stopping Brilinta now on Plaix 04/16/2016  . Chronic lower back pain   . CKD (chronic kidney disease), stage III (HCC)    stage 3  . Complication of anesthesia   . COPD (chronic obstructive pulmonary disease) ([place])   . Depression   . Diverticulosis   . DJD (degenerative joint disease)   . Family history of adverse reaction to anesthesia   . Gallstones   . GERD (gastroesophageal reflux disease)   . History of gout   . History of kidney stones   . Hyperlipidemia   . Hypertension   . IBS (irritable bowel syndrome)   . Ischemic cardiomyopathy    a. 04/2016: EF 40-50% by cath, 50-55% +WMA by echo.  . 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 ([place]) ~ 2015  . Morbid obesity ([place])   . NSTEMI (non-ST elevated myocardial infarction) ([place]) 04/14/2016  . Obstructive sleep apnea of adult    appt for cpap 8-8 to adjust settings no cpap at this time  . On supplemental oxygen therapy    uses at bedtime or exposed to  heat, 2Liters  . Peripheral neuropathy    feet and toes  . Peripheral vascular disease ([place])   . RLS (restless legs syndrome)   . Spinal stenosis   . Type II diabetes mellitus ([place])   . Vitamin D deficiency   . Walking pneumonia   . Wears partial dentures    lower     SURGICAL HISTORY: Past Surgical History:  Procedure Laterality Date  . APPENDECTOMY    . BASAL CELL CARCINOMA EXCISION Left    leg  . BIOPSY  12/25/2019   Procedure: BIOPSY;  Surgeon: Lavena Bullion, DO;  Location: [facility];  Service: Gastroenterology;;  EGD and COLON  . CARDIAC CATHETERIZATION N/A 04/15/2016   Procedure: Left Heart Cath and Coronary Angiography;  Surgeon: Belva Crome, MD;  Location: [place];  Service: Cardiovascular;  Laterality: N/A;  .  CARDIAC CATHETERIZATION N/A 04/15/2016   Procedure: Coronary Stent Intervention;  Surgeon: Belva Crome, MD;  Location: [facility];  Service: Cardiovascular;  Laterality: N/A;  Mid LAD Mid CFX  . CARPAL TUNNEL RELEASE Right    with trigger finger release  . CATARACT EXTRACTION, BILATERAL Bilateral   . COLONOSCOPY WITH PROPOFOL N/A 12/25/2019   Procedure: COLONOSCOPY WITH PROPOFOL;  Surgeon: Lavena Bullion, DO;  Location: [facility];  Service: Gastroenterology;  Laterality: N/A;  . DEBRIDEMENT TENNIS ELBOW Right   . ESOPHAGOGASTRODUODENOSCOPY (EGD) WITH PROPOFOL N/A 12/25/2019   Procedure: ESOPHAGOGASTRODUODENOSCOPY (EGD) WITH PROPOFOL;  Surgeon: Lavena Bullion, DO;  Location: [facility];  Service: Gastroenterology;  Laterality: N/A;  . LUMBAR DISC SURGERY  X 2  . MELANOMA EXCISION Left    "towards the back of my neck"  . POLYPECTOMY  12/25/2019   Procedure: POLYPECTOMY;  Surgeon: Lavena Bullion, DO;  Location: [facility];  Service: Gastroenterology;;  . SHOULDER ARTHROSCOPY W/ ROTATOR CUFF REPAIR Left   . SHOULDER OPEN ROTATOR CUFF REPAIR Right   . TOTAL KNEE ARTHROPLASTY Bilateral      SOCIAL HISTORY: Social  History   Socioeconomic History  . Marital status: Widowed    Spouse name: Not on file  . Number of children: 1  . Years of education: Not on file  . Highest education level: Not on file  Occupational History  . Occupation: retired  Tobacco Use  . Smoking status: Former Smoker    Packs/day: 2.50    Years: 25.00    Pack years: 62.50    Types: Cigarettes    Quit date: 11/11/1983    Years since quitting: 36.4  . Smokeless tobacco: Never Used  Vaping Use  . Vaping Use: Never used  Substance and Sexual Activity  . Alcohol use: No    Alcohol/week: 0.0 standard drinks  . Drug use: No  . Sexual activity: Not Currently  Other Topics Concern  . Not on file  Social History Narrative  . Not on file   Social Determinants of Health   Financial Resource Strain: Not on file  Food Insecurity: Not on file  Transportation Needs: Not on file  Physical Activity: Not on file  Stress: Not on file  Social Connections: Not on file  Intimate Partner Violence: Not on file     FAMILY HISTORY: Family History  Problem Relation Age of Onset  . Heart disease Mother   . Heart attack Mother   . Kidney disease Father   . AAA (abdominal aortic aneurysm) Father   . Parkinson's disease Father   . Cancer Brother        type unknown  . Heart disease Son   . Cancer Sister        female   . Alzheimer's disease Sister   . Colon cancer Neg Hx   . Colon polyps Neg Hx   . Esophageal cancer Neg Hx   . Pancreatic cancer Neg Hx   . Stomach cancer Neg Hx   . Liver disease Neg Hx   . Diabetes Neg Hx      ALLERGIES:   is allergic to brilinta [ticagrelor], aspirin, nsaids, minocycline hcl, oruvail [ketoprofen], tricor [fenofibrate], vasotec [enalaprilat], and zinc.   MEDICATIONS:  Current Outpatient Medications  Medication Sig Dispense Refill  . acetaminophen (TYLENOL) 325 MG tablet Take 2 tablets (650 mg total) by mouth every 4 (four) hours as needed for headache or mild pain.    Marland Kitchen albuterol  (VENTOLIN  HFA) 108 (90 Base) MCG/ACT inhaler Inhale 2 puffs into the lungs every 4 (four) hours as needed for wheezing or shortness of breath. 18 g 3  . amoxicillin (AMOXIL) 875 MG tablet Take 875 mg by mouth 2 (two) times daily.    Marland Kitchen aspirin EC 81 MG EC tablet Take 1 tablet (81 mg total) by mouth daily.    Marland Kitchen atorvastatin (LIPITOR) 80 MG tablet TAKE 1 TABLET (80 MG TOTAL) BY MOUTH DAILY AT 6 PM. 90 tablet 2  . Budeson-Glycopyrrol-Formoterol (BREZTRI AEROSPHERE) 160-9-4.8 MCG/ACT AERO Inhale 2 puffs into the lungs in the morning and at bedtime. 5.9 g 2  . buPROPion (WELLBUTRIN XL) 300 MG 24 hr tablet TAKE 1 TABLET (300 MG TOTAL) BY MOUTH DAILY FOR MOOD 90 tablet 3  . Cholecalciferol ([employer] VIT D 5000 HIGH-POTENCY) 5000 units capsule Take 5,000 Units by mouth 2 (two) times daily.     . Continuous Blood Gluc Receiver (FREESTYLE LIBRE 14 DAY READER) DEVI 1 Device by Does not apply route every 14 (fourteen) days. 2 each 4  . Continuous Blood Gluc Sensor (FREESTYLE LIBRE 14 DAY SENSOR) MISC Apply 1 application topically every 14 (fourteen) days. 2 each 4  . diclofenac sodium (VOLTAREN) 1 % GEL Apply 4 g topically 4 (four) times daily. (Patient taking differently: Apply 4 g topically 4 (four) times daily as needed (pain). ) 100 g 3  . escitalopram (LEXAPRO) 20 MG tablet Take 1 tablet (20 mg total) by mouth daily. 90 tablet 3  . ferrous sulfate 325 (65 FE) MG tablet Take 325 mg by mouth 2 (two) times daily with a meal.     . furosemide (LASIX) 40 MG tablet Take 40 mg by mouth daily as needed for fluid.     Marland Kitchen gabapentin (NEURONTIN) 100 MG capsule Take 1 to 2 capsules 3 to 4 x /day as needed for Painful Diabetic Neuropathy (Patient taking differently: Take 200 mg by mouth 2 (two) times daily. ) 360 capsule 3  . Insulin Glargine (BASAGLAR KWIKPEN) 100 UNIT/ML SOPN Inject 70 units Daily for Diabetes (Patient taking differently: Inject 70 Units into the skin at bedtime. Inject 70 units Daily for Diabetes) 20 pen 3   . Insulin Pen Needle (BD PEN NEEDLE NANO U/F) 32G X 4 MM MISC USE AS DIRECTED 4 TIMES A DAY 400 each 3  . ketoconazole (NIZORAL) 2 % cream APPLY TO AFFECTED AREA TWICE A DAY (Patient taking differently: Apply 1 application topically daily as needed (itching). ) 60 g 3  . Lancets (ONETOUCH ULTRASOFT) lancets Use as instructed 100 each 12  . liraglutide (VICTOZA) 18 MG/3ML SOPN Inject 1.2 mg into the skin every morning. 6 mL 3  . metoprolol succinate (TOPROL-XL) 25 MG 24 hr tablet Take 1 tablet (25 mg total) by mouth at bedtime. 90 tablet 3  . nitroGLYCERIN (NITROSTAT) 0.4 MG SL tablet Place 1 tablet (0.4 mg total) under the tongue every 5 (five) minutes x 3 doses as needed for chest pain. 25 tablet 4  . NOVOLOG FLEXPEN 100 UNIT/ML FlexPen INJECT 50 TO 75 UNITS 3 X /DAY BEFORE MEALS AS DIRECTED FOR DIABETES (Patient taking differently: Inject 20-25 Units into the skin 3 (three) times daily before meals. BS<150 Take 20 units BS>150 Take 25 units) 15 mL 4  . ONETOUCH VERIO test strip USE AS INSTRUCTED TO CHECK SUGAR 2 TIMES DAILY. 200 each 4  . pantoprazole (PROTONIX) 40 MG tablet Take 1 tablet (40 mg total) by mouth 2 (two) times daily  before a meal. 180 tablet 1  . Polyethylene Glycol 400 (BLINK TEARS OP) Place 1 drop into both eyes daily as needed (dry eyes).     Marland Kitchen rOPINIRole (REQUIP) 3 MG tablet TAKE 1 TABLET 4 TIMES A DAY FOR RESTLESS LEGS 360 tablet 3  . tamsulosin (FLOMAX) 0.4 MG CAPS capsule Take 1 capsule (0.4 mg total) by mouth daily. 30 capsule 0  . tiZANidine (ZANAFLEX) 4 MG tablet Take      1 tablet     at Bedtime        if needed for Muscle Spasms 90 tablet 0  . triamcinolone (NASACORT) 55 MCG/ACT AERO nasal inhaler Place 2 sprays into the nose at bedtime. 48 g 3  . vitamin B-12 (CYANOCOBALAMIN) 1000 MCG tablet Take 1,000 mcg by mouth daily.     No current facility-administered medications for this visit.   REVIEW OF SYSTEMS:   A 10+ POINT REVIEW OF SYSTEMS WAS OBTAINED including  neurology, dermatology, psychiatry, cardiac, respiratory, lymph, extremities, GI, GU, Musculoskeletal, constitutional, breasts, reproductive, HEENT.  All pertinent positives are noted in the HPI.  All others are negative.   PHYSICAL EXAMINATION: ECOG PERFORMANCE STATUS: 2 - Symptomatic, <50% confined to bed  Vitals:   04/17/20 1344  BP: (!) 140/59  Pulse: 74  Resp: 18  Temp: 98.9 F (37.2 C)  SpO2: 95%   There were no vitals filed for this visit. There is no height or weight on file to calculate BMI.  Exam was given in a chair   GENERAL:alert, in no acute distress and comfortable SKIN: no acute rashes, no significant lesions EYES: conjunctiva are pink and non-injected, sclera anicteric OROPHARYNX: MMM, no exudates, no oropharyngeal erythema or ulceration NECK: supple, no JVD LYMPH:  no palpable lymphadenopathy in the cervical, axillary or inguinal regions LUNGS: clear to auscultation b/l with normal respiratory effort HEART: regular rate & rhythm ABDOMEN:  normoactive bowel sounds , non tender, not distended. No palpable hepatosplenomegaly.  Extremity: no pedal edema PSYCH: alert & oriented x 3 with fluent speech NEURO: no focal motor/sensory deficits  LABORATORY DATA:  I have reviewed the data as listed  CBC Latest Ref Rng & Units 04/16/2020 02/08/2020 01/08/2020  WBC 3.8 - 10.8 Thousand/uL 1.8(L) 2.4(L) 2.3(L)  Hemoglobin 11.7 - 15.5 g/dL 9.3(L) 9.2(L) 9.3(L)  Hematocrit 35.0 - 45.0 % 28.2(L) 29.5(L) 28.7(L)  Platelets 140 - 400 Thousand/uL 128(L) 120(L) 151   . CBC    Component Value Date/Time   WBC 1.8 (L) 04/16/2020 1649   RBC 2.83 (L) 04/16/2020 1649   HGB 9.3 (L) 04/16/2020 1649   HGB 9.1 (L) 05/15/2009 1032   HCT 28.2 (L) 04/16/2020 1649   HCT 28.2 (L) 10/18/2019 1233   HCT 31.6 (L) 05/15/2009 1032   PLT 128 (L) 04/16/2020 1649   PLT 243 05/15/2009 1032   MCV 99.6 04/16/2020 1649   MCV 78.8 (L) 05/15/2009 1032   MCH 32.9 04/16/2020 1649   MCHC 33.0  04/16/2020 1649   RDW 13.4 04/16/2020 1649   RDW 17.2 (H) 05/15/2009 1032   LYMPHSABS 806 (L) 04/16/2020 1649   LYMPHSABS 1.2 05/15/2009 1032   MONOABS 0.1 02/08/2020 1035   MONOABS 0.3 05/15/2009 1032   EOSABS 99 04/16/2020 1649   EOSABS 0.1 05/15/2009 1032   BASOSABS 0 04/16/2020 1649   BASOSABS 0.0 05/15/2009 1032     CMP Latest Ref Rng & Units 04/16/2020 02/08/2020 01/08/2020  Glucose 65 - 99 mg/dL 68 126(H) 135(H)  BUN 7 - 25  mg/dL '21 17 17  '$ Creatinine 0.60 - 0.93 mg/dL 1.14(H) 1.41(H) 1.31(H)  Sodium 135 - 146 mmol/L 145 141 144  Potassium 3.5 - 5.3 mmol/L 4.3 4.8 5.1  Chloride 98 - 110 mmol/L 108 106 106  CO2 20 - 32 mmol/L 32 28 27  Calcium 8.6 - 10.4 mg/dL 9.2 9.4 9.5  Total Protein 6.1 - 8.1 g/dL 6.4 6.3(L) 6.1  Total Bilirubin 0.2 - 1.2 mg/dL 0.4 0.3 0.5  Alkaline Phos 38 - 126 U/L - 71 -  AST 10 - 35 U/L 9(L) 10(L) 11  ALT 6 - 29 U/L $Remo'6 8 10    'YgkLP$ RADIOGRAPHIC STUDIES: I have personally reviewed the radiological images as listed and agreed with the findings in the report. SLEEP STUDY DOCUMENTS  Result Date: 04/07/2020 Ordered by an unspecified provider.    ASSESSMENT & PLAN:   PRISHA HILEY is a 79 y.o. female with:  1. 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 ? MDS vs anemia of CKD 2. Thrombocytopenia - mild 120-->150k range 3. Leucopenia/Neutropenia. [place] ha simproved from 1.2k-->1.5k  PLAN: -Discussed pt labwork, 04/16/20; Hgb is stable, PLT are borderline low, WBC continue to drop, blood chemistries are stable. -Advised pt that Neutrophils have dropped significantly, which puts her at a greater risk of infection.  -Advised pt that when multiple cell lines are dropping it could be caused by nutritional deficiencies or a primary bone marrow problem.  -Advised pt that her iron deficiency and CKD can contribute to anemia. Goal Ferritin >100 in the setting of CKD. -Recommend pt wear compression socks and elevate legs as much as possible. -Continue 1000 mcg Vitamin B12 and  Vitamin D once a day. -Will give IV Injectafer weekly x2  -Will get CT-guided BM Bx in 1 week -Will see back in 2 weeks via phone    FOLLOW UP CT bone marrow biopsy in 1 week Phone visit with Dr [name] in 2 weeks   The total time spent in the appt was 20 minutes and more than 50% was on counseling and direct patient cares.  All of the patient's questions were answered with apparent satisfaction. The patient knows to call the clinic with any problems, questions or concerns.   Sullivan Lone MD [place] AAHIVMS [facility] Hematology/Oncology Physician [facility]  (Office):       [phone] (Work cell):  [phone] (Fax):           [phone]  04/17/2020 4:44 PM  I, Yevette Edwards, am acting as a scribe for Dr. Sullivan Lone.   .I have reviewed the above documentation for accuracy and completeness, and I agree with the above. Brunetta Genera MD

## 2020-04-23 ENCOUNTER — Telehealth: Payer: Self-pay | Admitting: Emergency Medicine

## 2020-04-23 NOTE — Telephone Encounter (Signed)
ATC patient to let her know that I was reaching out to [employer] to see if they had any updates on patients BIPAP. Left message and sent community message for update will wait to hear from them

## 2020-04-24 NOTE — Telephone Encounter (Signed)
Response from [employer]:  [place], Caprice Kluver, [place], [place]; Miquel Dunn; Skeet Latch; [place], [place]; [place], [place]; 1 other Hello Janet Choi,   We had requested some items and keep getting the same items. This order was voided due to not getting the items needed without me knowing. I went ahead and re-reviewed the account, re-printed it with the new items that are attached and ask my manager to review it. Looks like we now have everything that is needed to process this order. We have expedited this order but due to the shortage it may be a but before we can get her in office. We still need auth. from ins but other that that the order looks ready to go. We will work to get her in as soon as we can. I will contact her and let her know as well.  Thank you,  Leroy [place]  Nothing further needed as [name] is going to contact patient with update.

## 2020-04-29 ENCOUNTER — Other Ambulatory Visit: Payer: Self-pay | Admitting: Internal Medicine

## 2020-04-29 DIAGNOSIS — IMO0002 Reserved for concepts with insufficient information to code with codable children: Secondary | ICD-10-CM

## 2020-05-05 ENCOUNTER — Other Ambulatory Visit: Payer: Self-pay | Admitting: Internal Medicine

## 2020-05-05 MED ORDER — FLUCONAZOLE 100 MG PO TABS: ORAL_TABLET | ORAL | 3 refills | Status: DC

## 2020-05-07 ENCOUNTER — Other Ambulatory Visit: Payer: Self-pay | Admitting: Internal Medicine

## 2020-05-08 ENCOUNTER — Other Ambulatory Visit: Payer: Self-pay | Admitting: Student

## 2020-05-11 DIAGNOSIS — G4733 Obstructive sleep apnea (adult) (pediatric): Secondary | ICD-10-CM | POA: Diagnosis not present

## 2020-05-11 DIAGNOSIS — I2510 Atherosclerotic heart disease of native coronary artery without angina pectoris: Secondary | ICD-10-CM | POA: Diagnosis not present

## 2020-05-11 DIAGNOSIS — I5032 Chronic diastolic (congestive) heart failure: Secondary | ICD-10-CM | POA: Diagnosis not present

## 2020-05-12 ENCOUNTER — Encounter (HOSPITAL_COMMUNITY): Payer: Self-pay

## 2020-05-12 ENCOUNTER — Other Ambulatory Visit: Payer: Self-pay

## 2020-05-12 ENCOUNTER — Ambulatory Visit (HOSPITAL_COMMUNITY)
Admission: RE | Admit: 2020-05-12 | Discharge: 2020-05-12 | Disposition: A | Discharge status: Home / Self Care | Payer: PPO | Source: Ambulatory Visit | Attending: Hematology | Admitting: Hematology

## 2020-05-12 DIAGNOSIS — I129 Hypertensive chronic kidney disease with stage 1 through stage 4 chronic kidney disease, or unspecified chronic kidney disease: Secondary | ICD-10-CM | POA: Diagnosis not present

## 2020-05-12 DIAGNOSIS — Z6841 Body Mass Index (BMI) 40.0 and over, adult: Secondary | ICD-10-CM | POA: Insufficient documentation

## 2020-05-12 DIAGNOSIS — Z955 Presence of coronary angioplasty implant and graft: Secondary | ICD-10-CM | POA: Insufficient documentation

## 2020-05-12 DIAGNOSIS — Z96653 Presence of artificial knee joint, bilateral: Secondary | ICD-10-CM | POA: Insufficient documentation

## 2020-05-12 DIAGNOSIS — E1151 Type 2 diabetes mellitus with diabetic peripheral angiopathy without gangrene: Secondary | ICD-10-CM | POA: Insufficient documentation

## 2020-05-12 DIAGNOSIS — E785 Hyperlipidemia, unspecified: Secondary | ICD-10-CM | POA: Insufficient documentation

## 2020-05-12 DIAGNOSIS — I2510 Atherosclerotic heart disease of native coronary artery without angina pectoris: Secondary | ICD-10-CM | POA: Insufficient documentation

## 2020-05-12 DIAGNOSIS — I255 Ischemic cardiomyopathy: Secondary | ICD-10-CM | POA: Diagnosis not present

## 2020-05-12 DIAGNOSIS — Z7951 Long term (current) use of inhaled steroids: Secondary | ICD-10-CM | POA: Insufficient documentation

## 2020-05-12 DIAGNOSIS — K589 Irritable bowel syndrome without diarrhea: Secondary | ICD-10-CM | POA: Insufficient documentation

## 2020-05-12 DIAGNOSIS — Z79899 Other long term (current) drug therapy: Secondary | ICD-10-CM | POA: Insufficient documentation

## 2020-05-12 DIAGNOSIS — J449 Chronic obstructive pulmonary disease, unspecified: Secondary | ICD-10-CM | POA: Diagnosis not present

## 2020-05-12 DIAGNOSIS — G4733 Obstructive sleep apnea (adult) (pediatric): Secondary | ICD-10-CM | POA: Insufficient documentation

## 2020-05-12 DIAGNOSIS — Z7902 Long term (current) use of antithrombotics/antiplatelets: Secondary | ICD-10-CM | POA: Diagnosis not present

## 2020-05-12 DIAGNOSIS — E1122 Type 2 diabetes mellitus with diabetic chronic kidney disease: Secondary | ICD-10-CM | POA: Diagnosis not present

## 2020-05-12 DIAGNOSIS — D61818 Other pancytopenia: Secondary | ICD-10-CM | POA: Diagnosis not present

## 2020-05-12 DIAGNOSIS — E669 Obesity, unspecified: Secondary | ICD-10-CM | POA: Insufficient documentation

## 2020-05-12 DIAGNOSIS — Z794 Long term (current) use of insulin: Secondary | ICD-10-CM | POA: Insufficient documentation

## 2020-05-12 DIAGNOSIS — Z87891 Personal history of nicotine dependence: Secondary | ICD-10-CM | POA: Diagnosis not present

## 2020-05-12 DIAGNOSIS — C9200 Acute myeloblastic leukemia, not having achieved remission: Secondary | ICD-10-CM | POA: Diagnosis not present

## 2020-05-12 DIAGNOSIS — N189 Chronic kidney disease, unspecified: Secondary | ICD-10-CM | POA: Diagnosis not present

## 2020-05-12 DIAGNOSIS — F32A Depression, unspecified: Secondary | ICD-10-CM | POA: Insufficient documentation

## 2020-05-12 DIAGNOSIS — K219 Gastro-esophageal reflux disease without esophagitis: Secondary | ICD-10-CM | POA: Insufficient documentation

## 2020-05-12 LAB — CBC WITH DIFFERENTIAL/PLATELET
Abs Immature Granulocytes: 0.01 10*3/uL (ref 0.00–0.07)
Basophils Absolute: 0 10*3/uL (ref 0.0–0.1)
Basophils Relative: 0 %
Eosinophils Absolute: 0.1 10*3/uL (ref 0.0–0.5)
Eosinophils Relative: 4 %
HCT: 28 % — ABNORMAL LOW (ref 36.0–46.0)
Hemoglobin: 8.9 g/dL — ABNORMAL LOW (ref 12.0–15.0)
Immature Granulocytes: 1 %
Lymphocytes Relative: 33 %
Lymphs Abs: 0.6 10*3/uL — ABNORMAL LOW (ref 0.7–4.0)
MCH: 33.6 pg (ref 26.0–34.0)
MCHC: 31.8 g/dL (ref 30.0–36.0)
MCV: 105.7 fL — ABNORMAL HIGH (ref 80.0–100.0)
Monocytes Absolute: 0.1 10*3/uL (ref 0.1–1.0)
Monocytes Relative: 4 %
Neutro Abs: 1.1 10*3/uL — ABNORMAL LOW (ref 1.7–7.7)
Neutrophils Relative %: 58 %
Platelets: 139 10*3/uL — ABNORMAL LOW (ref 150–400)
RBC: 2.65 MIL/uL — ABNORMAL LOW (ref 3.87–5.11)
RDW: 15.7 % — ABNORMAL HIGH (ref 11.5–15.5)
WBC: 1.9 10*3/uL — ABNORMAL LOW (ref 4.0–10.5)
nRBC: 0 % (ref 0.0–0.2)

## 2020-05-12 LAB — GLUCOSE, CAPILLARY: Glucose-Capillary: 142 mg/dL — ABNORMAL HIGH (ref 70–99)

## 2020-05-12 LAB — PROTIME-INR
INR: 1.1 (ref 0.8–1.2)
Prothrombin Time: 13.7 seconds (ref 11.4–15.2)

## 2020-05-12 MED ORDER — MIDAZOLAM HCL 2 MG/2ML IJ SOLN
INTRAMUSCULAR | Status: AC
  Filled 2020-05-12: qty 4

## 2020-05-12 MED ORDER — FENTANYL CITRATE (PF) 100 MCG/2ML IJ SOLN
INTRAMUSCULAR | Status: AC
  Filled 2020-05-12: qty 2

## 2020-05-12 MED ORDER — SODIUM CHLORIDE 0.9 % IV SOLN: INTRAVENOUS | Status: DC

## 2020-05-12 MED ORDER — FENTANYL CITRATE (PF) 100 MCG/2ML IJ SOLN
INTRAMUSCULAR | Status: AC | PRN
  Administered 2020-05-12: 25 ug via INTRAVENOUS
  Administered 2020-05-12: 50 ug via INTRAVENOUS
  Administered 2020-05-12: 25 ug via INTRAVENOUS

## 2020-05-12 MED ORDER — MIDAZOLAM HCL 2 MG/2ML IJ SOLN
INTRAMUSCULAR | Status: AC | PRN
  Administered 2020-05-12: 0.5 mg via INTRAVENOUS
  Administered 2020-05-12: 1 mg via INTRAVENOUS
  Administered 2020-05-12: 0.5 mg via INTRAVENOUS

## 2020-05-12 MED ORDER — LIDOCAINE-EPINEPHRINE 1 %-1:100000 IJ SOLN
INTRAMUSCULAR | Status: AC | PRN
  Administered 2020-05-12: 10 mL

## 2020-05-12 NOTE — Consult Note (Signed)
Chief Complaint: Patient was seen in consultation today for CT guided bone marrow biopsy  Referring Physician(s): Brunetta Genera  Supervising Physician: Sandi Mariscal  Patient Status: [facility] - Out-pt  History of Present Illness: Janet Choi is a 80 y.o. female with multiple medical problems including coronary artery disease with prior MI/stenting, chronic kidney disease, COPD, depression, diverticulosis, GERD, gout, hyperlipidemia, hypertension, IBS, ischemic cardiomyopathy, melanoma, obesity, obstructive sleep apnea, peripheral vascular disease, diabetes, and now with pancytopenia of unknown etiology. She presents today for CT-guided bone marrow biopsy for further evaluation/rule out MDS.  Past Medical History:  Diagnosis Date  . Anemia    hx (04/14/2016)  . Anxiety   . Arthritis    "severe in my back; hands; ankles" (04/14/2016)  . Atypical chest pain 01/24/2018   Midline pain absent supine "constant" daytime since 12/31/17 > resolved as of 02/20/2018 on gerd/ gas diet   . Basal cell carcinoma    "several burned off; one cut off"  . CAD in native artery    a. NSTEMI 04/2016 - s/p DES toLAD and LCx. PCI to LCx notable for microembolization during cath.  . Chest pain- reslved with stopping Brilinta now on Plaix 04/16/2016  . Chronic lower back pain   . CKD (chronic kidney disease), stage III (HCC)    stage 3  . Complication of anesthesia   . COPD (chronic obstructive pulmonary disease) ([place])   . Depression   . Diverticulosis   . DJD (degenerative joint disease)   . Family history of adverse reaction to anesthesia   . Gallstones   . GERD (gastroesophageal reflux disease)   . History of gout   . History of kidney stones   . Hyperlipidemia   . Hypertension   . IBS (irritable bowel syndrome)   . Ischemic cardiomyopathy    a. 04/2016: EF 40-50% by cath, 50-55% +WMA by echo.  . 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 ([place]) ~ 2015  . Morbid obesity ([place])   . NSTEMI  (non-ST elevated myocardial infarction) ([place]) 04/14/2016  . Obstructive sleep apnea of adult    appt for cpap 8-8 to adjust settings no cpap at this time  . On supplemental oxygen therapy    uses at bedtime or exposed to heat, 2Liters  . Peripheral neuropathy    feet and toes  . Peripheral vascular disease ([employer])   . RLS (restless legs syndrome)   . Spinal stenosis   . Type II diabetes mellitus ([place])   . Vitamin D deficiency   . Walking pneumonia   . Wears partial dentures    lower    Past Surgical History:  Procedure Laterality Date  . APPENDECTOMY    . BASAL CELL CARCINOMA EXCISION Left    leg  . BIOPSY  12/25/2019   Procedure: BIOPSY;  Surgeon: Lavena Bullion, DO;  Location: [facility];  Service: Gastroenterology;;  EGD and COLON  . CARDIAC CATHETERIZATION N/A 04/15/2016   Procedure: Left Heart Cath and Coronary Angiography;  Surgeon: Belva Crome, MD;  Location: [place] [facility];  Service: Cardiovascular;  Laterality: N/A;  . CARDIAC CATHETERIZATION N/A 04/15/2016   Procedure: Coronary Stent Intervention;  Surgeon: Belva Crome, MD;  Location: [place] [facility];  Service: Cardiovascular;  Laterality: N/A;  Mid LAD Mid CFX  . CARPAL TUNNEL RELEASE Right    with trigger finger release  . CATARACT EXTRACTION, BILATERAL Bilateral   . COLONOSCOPY WITH PROPOFOL N/A 12/25/2019   Procedure: COLONOSCOPY WITH  PROPOFOL;  Surgeon: Lavena Bullion, DO;  Location: [facility];  Service: Gastroenterology;  Laterality: N/A;  . DEBRIDEMENT TENNIS ELBOW Right   . ESOPHAGOGASTRODUODENOSCOPY (EGD) WITH PROPOFOL N/A 12/25/2019   Procedure: ESOPHAGOGASTRODUODENOSCOPY (EGD) WITH PROPOFOL;  Surgeon: Lavena Bullion, DO;  Location: [facility];  Service: Gastroenterology;  Laterality: N/A;  . LUMBAR DISC SURGERY  X 2  . MELANOMA EXCISION Left    "towards the back of my neck"  . POLYPECTOMY  12/25/2019   Procedure: POLYPECTOMY;  Surgeon: Lavena Bullion, DO;  Location: [facility];   Service: Gastroenterology;;  . SHOULDER ARTHROSCOPY W/ ROTATOR CUFF REPAIR Left   . SHOULDER OPEN ROTATOR CUFF REPAIR Right   . TOTAL KNEE ARTHROPLASTY Bilateral     Allergies: Brilinta [ticagrelor], Aspirin, Nsaids, Minocycline hcl, Oruvail [ketoprofen], Tricor [fenofibrate], Vasotec [enalaprilat], and Zinc  Medications: Prior to Admission medications   Medication Sig Start Date End Date Taking? Authorizing Provider  acetaminophen (TYLENOL) 325 MG tablet Take 2 tablets (650 mg total) by mouth every 4 (four) hours as needed for headache or mild pain. 04/20/16  Yes Isaiah Serge, NP  albuterol (VENTOLIN HFA) 108 (90 Base) MCG/ACT inhaler Inhale 2 puffs into the lungs every 4 (four) hours as needed for wheezing or shortness of breath. 09/07/19  Yes Vladimir Crofts, PA-C  amoxicillin (AMOXIL) 875 MG tablet Take 875 mg by mouth 2 (two) times daily. 03/30/20  Yes [provider]  Ascorbic Acid (VITAMIN C) 1000 MG tablet Take 1,000 mg by mouth daily.   Yes [provider]  aspirin EC 81 MG EC tablet Take 1 tablet (81 mg total) by mouth daily. 04/21/16  Yes Isaiah Serge, NP  atorvastatin (LIPITOR) 80 MG tablet TAKE 1 TABLET (80 MG TOTAL) BY MOUTH DAILY AT 6 PM. 11/23/19  Yes Skeet Latch, MD  Budeson-Glycopyrrol-Formoterol (BREZTRI AEROSPHERE) 160-9-4.8 MCG/ACT AERO Inhale 2 puffs into the lungs in the morning and at bedtime. 10/15/19  Yes Collene Gobble, MD  buPROPion (WELLBUTRIN XL) 300 MG 24 hr tablet TAKE 1 TABLET (300 MG TOTAL) BY MOUTH DAILY FOR MOOD 03/25/20  Yes Liane Comber, NP  Cholecalciferol 125 MCG (5000 UT) capsule Take 5,000 Units by mouth 2 (two) times daily.    Yes [provider]  Continuous Blood Gluc Receiver (FREESTYLE LIBRE 14 DAY READER) DEVI 1 Device by Does not apply route every 14 (fourteen) days. 09/07/19  Yes Vicie Mutters R, PA-C  Continuous Blood Gluc Sensor (FREESTYLE LIBRE 14 DAY SENSOR) MISC Apply 1 application topically every 14  (fourteen) days. 09/07/19  Yes Vicie Mutters R, PA-C  diclofenac sodium (VOLTAREN) 1 % GEL Apply 4 g topically 4 (four) times daily. Patient taking differently: Apply 4 g topically 4 (four) times daily as needed (pain). 01/18/18  Yes Vicie Mutters R, PA-C  escitalopram (LEXAPRO) 20 MG tablet Take 1 tablet (20 mg total) by mouth daily. 01/08/20 01/07/21 Yes McClanahan, Danton Sewer, NP  ferrous sulfate 325 (65 FE) MG tablet Take 325 mg by mouth 2 (two) times daily with a meal.    Yes [provider]  fluconazole (DIFLUCAN) 100 MG tablet Take      1 tablet     Daily       for Oral Thrush (Yeast) Infection 05/05/20  Yes [name], MD  furosemide (LASIX) 40 MG tablet Take 40 mg by mouth daily as needed for fluid.    Yes [provider]  gabapentin (NEURONTIN) 100 MG capsule Take 1 to 2 capsules  3 to 4 x /day as needed for Painful Diabetic Neuropathy Patient taking differently: Take 200 mg by mouth 2 (two) times daily. 01/01/19  Yes [name], MD  Insulin Glargine [facility]) 100 UNIT/ML INJECT 70 UNITS DAILY FOR DIABETES 04/29/20  Yes Liane Comber, NP  Insulin Pen Needle (BD PEN NEEDLE NANO U/F) 32G X 4 MM MISC USE AS DIRECTED 4 TIMES A DAY 02/22/19  Yes [name], MD  ketoconazole (NIZORAL) 2 % cream APPLY TO AFFECTED AREA TWICE A DAY Patient taking differently: Apply 1 application topically daily as needed (itching). 11/16/19  Yes [name], MD  Lancets [facility] ULTRASOFT) lancets Use as instructed 04/27/16  Yes Philemon Kingdom, MD  liraglutide (VICTOZA) 18 MG/3ML SOPN Inject 1.2 mg into the skin every morning. 04/16/20  Yes McClanahan, Danton Sewer, NP  metoprolol succinate (TOPROL-XL) 25 MG 24 hr tablet Take 1 tablet (25 mg total) by mouth at bedtime. 07/25/19  Yes Duke, Tami Lin, PA  NOVOLOG FLEXPEN 100 UNIT/ML FlexPen INJECT 50 TO 75 UNITS 3 X /DAY BEFORE MEALS AS DIRECTED FOR DIABETES Patient taking differently: Inject 20-25 Units into the skin 3 (three)  times daily before meals. BS<150 Take 20 units BS>150 Take 25 units 11/16/19  Yes [name], MD  [facility] VERIO test strip USE AS INSTRUCTED TO CHECK SUGAR 2 TIMES DAILY. 09/21/17  Yes Philemon Kingdom, MD  pantoprazole (PROTONIX) 40 MG tablet Take 1 tablet (40 mg total) by mouth 2 (two) times daily before a meal. 10/15/19 10/14/20 Yes Vladimir Crofts, PA-C  Polyethylene Glycol 400 (BLINK TEARS OP) Place 1 drop into both eyes daily as needed (dry eyes).    Yes [provider]  rOPINIRole (REQUIP) 3 MG tablet TAKE 1 TABLET 4 TIMES A DAY FOR RESTLESS LEGS 03/25/20  Yes Liane Comber, NP  tiZANidine (ZANAFLEX) 4 MG tablet TAKE 1 TABLET AT BEDTIME IF NEEDED FOR MUSCLE SPASMS 05/07/20  Yes Liane Comber, NP  triamcinolone (NASACORT) 55 MCG/ACT AERO nasal inhaler Place 2 sprays into the nose at bedtime. 04/16/20 04/16/21 Yes McClanahan, Danton Sewer, NP  vitamin B-12 (CYANOCOBALAMIN) 1000 MCG tablet Take 1,000 mcg by mouth daily.   Yes [provider]  vitamin E (VITAMIN E) 180 MG (400 UNITS) capsule Take 400 Units by mouth daily.   Yes [provider]  nitroGLYCERIN (NITROSTAT) 0.4 MG SL tablet Place 1 tablet (0.4 mg total) under the tongue every 5 (five) minutes x 3 doses as needed for chest pain. 10/04/17   [name], MD  tamsulosin (FLOMAX) 0.4 MG CAPS capsule Take 1 capsule (0.4 mg total) by mouth daily. 12/05/19   Garald Balding, PA-C     Family History  Problem Relation Age of Onset  . Heart disease Mother   . Heart attack Mother   . Kidney disease Father   . AAA (abdominal aortic aneurysm) Father   . Parkinson's disease Father   . Cancer Brother        type unknown  . Heart disease Son   . Cancer Sister        female   . Alzheimer's disease Sister   . Colon cancer Neg Hx   . Colon polyps Neg Hx   . Esophageal cancer Neg Hx   . Pancreatic cancer Neg Hx   . Stomach cancer Neg Hx   . Liver disease Neg Hx   . Diabetes Neg Hx     Social History    Socioeconomic History  . Marital status: Widowed  Spouse name: Not on file  . Number of children: 1  . Years of education: Not on file  . Highest education level: Not on file  Occupational History  . Occupation: retired  Tobacco Use  . Smoking status: Former Smoker    Packs/day: 2.50    Years: 25.00    Pack years: 62.50    Types: Cigarettes    Quit date: 11/11/1983    Years since quitting: 36.5  . Smokeless tobacco: Never Used  Vaping Use  . Vaping Use: Never used  Substance and Sexual Activity  . Alcohol use: No    Alcohol/week: 0.0 standard drinks  . Drug use: No  . Sexual activity: Not Currently  Other Topics Concern  . Not on file  Social History Narrative  . Not on file   Social Determinants of Health   Financial Resource Strain: Not on file  Food Insecurity: Not on file  Transportation Needs: Not on file  Physical Activity: Not on file  Stress: Not on file  Social Connections: Not on file      Review of Systems denies fever, headache, chest pain, abdominal pain, nausea, vomiting or bleeding.  She does have chronic dyspnea, occasional cough, oral thrush, occasional back pain.  Vital Signs: BP (!) 136/57   Pulse 80   Temp 98.5 F (36.9 C) (Oral)   Resp 20   Ht _0  (1.549 m)   Wt 238 lb (108 kg)   SpO2 96%   BMI 44.97 kg/m   Physical Exam awake, alert.  Chest with distant breath sounds bilaterally.  Heart with regular rate and rhythm.  Abdomen obese, soft, positive bowel sounds, nontender.  No significant lower extremity edema.  Imaging: No results found.  Labs:  CBC: Recent Labs    01/08/20 1456 02/08/20 1035 04/16/20 1649 05/12/20 0715  WBC 2.3* 2.4* 1.8* 1.9*  HGB 9.3* 9.2* 9.3* 8.9*  HCT 28.7* 29.5* 28.2* 28.0*  PLT 151 120* 128* 139*    COAGS: Recent Labs    05/12/20 0715  INR 1.1    BMP: Recent Labs    12/29/19 0558 01/08/20 1456 02/08/20 1035 04/16/20 1649  NA 143 144 141 145  K 4.3 5.1 4.8 4.3  CL 106 106 106  108  CO2 _1 32  GLUCOSE 194* 135* 126* 68  BUN 31* _2 CALCIUM 8.7* 9.5 9.4 9.2  CREATININE 1.53* 1.31* 1.41* 1.14*  GFRNONAA 32* 39* 35* 46*  GFRAA 37* 45* 41* 53*    LIVER FUNCTION TESTS: Recent Labs    07/25/19 0224 07/30/19 1236 10/18/19 1232 11/05/19 1442 12/05/19 1554 01/08/20 1456 02/08/20 1035 04/16/20 1649  BILITOT 0.7   < > 0.4   < > 0.7 0.5 0.3 0.4  AST 13*   < > 9*   < > 16 11 10* 9*  ALT 13   < > 8   < > _3 ALKPHOS 87  --  90  --  82  --  71  --   PROT 6.0*   < > 6.3*   < > 6.6 6.1 6.3* 6.4  ALBUMIN 3.2*  --  3.5  --  3.8  --  3.5  --    < > = values in this interval not displayed.    TUMOR MARKERS: No results for input(s): AFPTM, CEA, CA199, CHROMGRNA in the last 8760 hours.  Assessment and Plan: 80 y.o. female with multiple medical problems including coronary artery  disease with prior MI/stenting, chronic kidney disease, COPD, depression, diverticulosis, GERD, gout, hyperlipidemia, hypertension, IBS, ischemic cardiomyopathy, melanoma, obesity, obstructive sleep apnea, peripheral vascular disease, diabetes, and now with pancytopenia of unknown etiology. She presents today for CT-guided bone marrow biopsy for further evaluation/rule out MDS.Risks and benefits of procedure was discussed with the patient  including, but not limited to bleeding, infection, damage to adjacent structures or low yield requiring additional tests.  All of the questions were answered and there is agreement to proceed.  Consent signed and in chart.     Thank you for this interesting consult.  I greatly enjoyed meeting Janet Choi and look forward to participating in their care.  A copy of this report was sent to the requesting provider on this date.  Electronically Signed: D. Rowe Robert, PA-C 05/12/2020, 8:21 AM   I spent a total of  25 minutes   in face to face in clinical consultation, greater than 50% of which was counseling/coordinating care for  CT-guided bone marrow biopsy

## 2020-05-12 NOTE — Discharge Instructions (Signed)
Bone Marrow Aspiration and Bone Marrow Biopsy, Adult, Care After This sheet gives you information about how to care for yourself after your procedure. Your health care provider may also give you more specific instructions. If you have problems or questions, contact your health care provider. What can I expect after the procedure? After the procedure, it is common to have:  Mild pain and tenderness.  Swelling.  Bruising. Follow these instructions at home: Puncture site care   Follow instructions from your health care provider about how to take care of the puncture site. Make sure you: ? Wash your hands with soap and water before and after you change your bandage (dressing). If soap and water are not available, use hand sanitizer. ? Change your dressing as told by your health care provider.  Check your puncture site every day for signs of infection. Check for: ? More redness, swelling, or pain. ? Fluid or blood. ? Warmth. ? Pus or a bad smell. Activity  Return to your normal activities as told by your health care provider. Ask your health care provider what activities are safe for you.  Do not lift anything that is heavier than 10 lb (4.5 kg), or the limit that you are told, until your health care provider says that it is safe.  Do not drive for 24 hours if you were given a sedative during your procedure. General instructions   Take over-the-counter and prescription medicines only as told by your health care provider.  Do not take baths, swim, or use a hot tub until your health care provider approves. Ask your health care provider if you may take showers. You may only be allowed to take sponge baths.  If directed, put ice on the affected area. To do this: ? Put ice in a plastic bag. ? Place a towel between your skin and the bag. ? Leave the ice on for 20 minutes, 2-3 times a day.  Keep all follow-up visits as told by your health care provider. This is important. Contact a  health care provider if:  Your pain is not controlled with medicine.  You have a fever.  You have more redness, swelling, or pain around the puncture site.  You have fluid or blood coming from the puncture site.  Your puncture site feels warm to the touch.  You have pus or a bad smell coming from the puncture site. Summary  After the procedure, it is common to have mild pain, tenderness, swelling, and bruising.  Follow instructions from your health care provider about how to take care of the puncture site and what activities are safe for you.  Take over-the-counter and prescription medicines only as told by your health care provider.  Contact a health care provider if you have any signs of infection, such as fluid or blood coming from the puncture site. This information is not intended to replace advice given to you by your health care provider. Make sure you discuss any questions you have with your health care provider. Document Revised: 09/12/2018 Document Reviewed: 09/12/2018 Elsevier Patient Education  [place]. Moderate Conscious Sedation, Adult, Care After These instructions provide you with information about caring for yourself after your procedure. Your health care provider may also give you more specific instructions. Your treatment has been planned according to current medical practices, but problems sometimes occur. Call your health care provider if you have any problems or questions after your procedure. What can I expect after the procedure? After your procedure,  it is common:  To feel sleepy for several hours.  To feel clumsy and have poor balance for several hours.  To have poor judgment for several hours.  To vomit if you eat too soon. Follow these instructions at home: For at least 24 hours after the procedure:   Do not: ? Participate in activities where you could fall or become injured. ? Drive. ? Use heavy machinery. ? Drink alcohol. ? Take  sleeping pills or medicines that cause drowsiness. ? Make important decisions or sign legal documents. ? Take care of children on your own.  Rest. Eating and drinking  Follow the diet recommended by your health care provider.  If you vomit: ? Drink water, juice, or soup when you can drink without vomiting. ? Make sure you have little or no nausea before eating solid foods. General instructions  Have a responsible adult stay with you until you are awake and alert.  Take over-the-counter and prescription medicines only as told by your health care provider.  If you smoke, do not smoke without supervision.  Keep all follow-up visits as told by your health care provider. This is important. Contact a health care provider if:  You keep feeling nauseous or you keep vomiting.  You feel light-headed.  You develop a rash.  You have a fever. Get help right away if:  You have trouble breathing. This information is not intended to replace advice given to you by your health care provider. Make sure you discuss any questions you have with your health care provider. Document Revised: 04/08/2017 Document Reviewed: 08/16/2015 Elsevier Patient Education  2020 [employer].

## 2020-05-12 NOTE — Procedures (Signed)
Pre-procedure Diagnosis: Pancytopenia Post-procedure Diagnosis: Same  Technically successful CT guided bone marrow aspiration and biopsy of left iliac crest.   Complications: None Immediate  EBL: None  Signed: Simonne Come Pager: [phone] 05/12/2020, 9:53 AM

## 2020-05-13 DIAGNOSIS — I2510 Atherosclerotic heart disease of native coronary artery without angina pectoris: Secondary | ICD-10-CM | POA: Diagnosis not present

## 2020-05-13 DIAGNOSIS — I5032 Chronic diastolic (congestive) heart failure: Secondary | ICD-10-CM | POA: Diagnosis not present

## 2020-05-14 ENCOUNTER — Encounter: Payer: PPO | Admitting: Adult Health Nurse Practitioner

## 2020-05-14 LAB — SURGICAL PATHOLOGY

## 2020-05-15 ENCOUNTER — Telehealth: Payer: Self-pay | Admitting: *Deleted

## 2020-05-15 ENCOUNTER — Other Ambulatory Visit: Payer: Self-pay | Admitting: Hematology

## 2020-05-15 NOTE — Telephone Encounter (Signed)
Per Dr. Candise Che - please contact patient to change appt currently on 1/10 from phone appt to in office appt on 1/7 at 12:40. Patient/son contacted and in agreement to make change. Patient states she'd rather have in person appt anyway due to being hard of hearing.

## 2020-05-16 ENCOUNTER — Other Ambulatory Visit: Payer: Self-pay

## 2020-05-16 ENCOUNTER — Inpatient Hospital Stay: Payer: PPO | Attending: Hematology | Admitting: Hematology

## 2020-05-16 VITALS — BP 137/64 | HR 71 | Temp 97.9°F | Resp 15 | Ht 61.0 in | Wt 242.2 lb

## 2020-05-16 DIAGNOSIS — E611 Iron deficiency: Secondary | ICD-10-CM | POA: Diagnosis not present

## 2020-05-16 DIAGNOSIS — I129 Hypertensive chronic kidney disease with stage 1 through stage 4 chronic kidney disease, or unspecified chronic kidney disease: Secondary | ICD-10-CM | POA: Insufficient documentation

## 2020-05-16 DIAGNOSIS — Z23 Encounter for immunization: Secondary | ICD-10-CM | POA: Insufficient documentation

## 2020-05-16 DIAGNOSIS — E1122 Type 2 diabetes mellitus with diabetic chronic kidney disease: Secondary | ICD-10-CM | POA: Insufficient documentation

## 2020-05-16 DIAGNOSIS — C9200 Acute myeloblastic leukemia, not having achieved remission: Secondary | ICD-10-CM | POA: Diagnosis not present

## 2020-05-16 DIAGNOSIS — D61818 Other pancytopenia: Secondary | ICD-10-CM

## 2020-05-16 DIAGNOSIS — D508 Other iron deficiency anemias: Secondary | ICD-10-CM | POA: Diagnosis not present

## 2020-05-16 DIAGNOSIS — N183 Chronic kidney disease, stage 3 unspecified: Secondary | ICD-10-CM | POA: Diagnosis not present

## 2020-05-16 NOTE — Progress Notes (Signed)
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 female who has been referred to [place] by [name], MD for evaluation and management of pancytopenia. Pt is accompanied today by her sister, [name]. The pt reports that she is doing well overall.   The pt reports she is good. She has acute inflammation in the gallbladder and it bothers her when she eats. Pt has chronic diarrhea and has been diagnosed with sleep apnea. She was given a sleep apnea machine about 7 years ago but did not use it. Pt's diabetes has not been well controlled recently. She is also on aspirin since her heart attack. Pt has been on Requip for several years now. She also has been on gabapentin for neuropathy for several years. Pt has starting taking iron supplement this week. She was recently placed on protonix and taken off of 112m of allopurinol. There is no liver or thyroid problems that she knows of.   She felt really bad after the second dose of the COVID19 vaccine and stayed in for 14 days back in April. She was really sick and her arm was red and swollen where the shot was administered. At the beginning of the year the pt had cellulitis and was on antibiotics for more than 3 weeks.   Pt has never had blood transfusions or IV iron. She has not had a gout  attack in several years.   Of note prior to the patient's visit today, pt has had [place]domen Complete ([id number] completed on 10/17/19 with results revealing "Cholelithiasis with positive sonographic Murphy's sign and mild wall thickening these changes would be consistent with acute cholecystitis in the appropriate clinical setting. HIDA scan may be helpful for further evaluation. Left renal calculi without obstructive changes. Left renal cyst."  Most recent lab results (10/15/19) of CBC is as follows: all values are WNL except for WBC at 2.5K, RBC at 2.65, Hemoglobin at 8.7, HCT at 26.5, Neutro Abs at 1353, Absolute Monocytes at 133, Glucose at 107, Creat at 1.30, GFR, Est Non Af Am at 39, GFR, Est AFR Am at 46, Total Protein at 5.8, AST at 8  On review of systems, pt reports SOB, chronic diarrhea, sleep apnea, fatigue, abdominal tenderness and denies infections, black/blood in stool, fever, chills, night sweats, unexpected weight loss, mouth soars, and any other symptoms.   On PMHx the pt reports chronic diarrhea, sleep apnea, diabetes, myocardial infarction, Vitamin B12 deficient, neuropathy, cellulitis  On Social Hx the pt reports no alcohol use   INTERVAL HISTORY:  Janet LOVANis a wonderful 80y.o. female who is here for evaluation and management of pancytopenia. The patient's last visit with [place] on 04/17/2020. The pt is accompanied today by her sister. The pt reports that she is doing well overall.  The pt reports there were no complications or symptoms arising from the BM Bx. She notes that she is very fatigued and feels as though  she desires to sleep all the time.  The pt notes that she has experienced thrush and tooth pain. She is taking Diflucan and Amoxicillin, respectively.  Of note since the patient's last visit, pt has had Bone Marrow Report ([id number]) completed on 05/12/2020 with results revealing "BONE MARROW, ASPIRATE, CLOT, CORE: -Slightly hypercellular bone  marrow for age with evolving acute myeloid leukemia PERIPHERAL BLOOD: -Pancytopenia."  Of note since the patient's last visit, pt has had Flow Pathology Report ([id number]) completed on 05/12/2020 with results revealing "Significant myeloblastic population identified (20%)."  Lab results (05/12/20) of CBC w/diff and CMP is as follows: all values are WNL except for WBC at 1.9K, RBC at 2.65, Hgb at 8.9, HCT at 28.0, MCV at 105.7, RDW at 15.7, PLT at 139K, Neutro Abs at 1.1K, Lymphs Abs at 0.6K.   On review of systems, pt reports very strong fatigue and mouth sores and denies leg swelling, abdominal pain, and diarrhea.   MEDICAL HISTORY:  Past Medical History:  Diagnosis Date  . Anemia    hx (04/14/2016)  . Anxiety   . Arthritis    "severe in my back; hands; ankles" (04/14/2016)  . Atypical chest pain 01/24/2018   Midline pain absent supine "constant" daytime since 12/31/17 > resolved as of 02/20/2018 on gerd/ gas diet   . Basal cell carcinoma    "several burned off; one cut off"  . CAD in native artery    a. NSTEMI 04/2016 - s/p DES toLAD and LCx. PCI to LCx notable for microembolization during cath.  . Chest pain- reslved with stopping Brilinta now on Plaix 04/16/2016  . Chronic lower back pain   . CKD (chronic kidney disease), stage III (HCC)    stage 3  . Complication of anesthesia   . COPD (chronic obstructive pulmonary disease) ([place])   . Depression   . Diverticulosis   . DJD (degenerative joint disease)   . Family history of adverse reaction to anesthesia   . Gallstones   . GERD (gastroesophageal reflux disease)   . History of gout   . History of kidney stones   . Hyperlipidemia   . Hypertension   . IBS (irritable bowel syndrome)   . Ischemic cardiomyopathy    a. 04/2016: EF 40-50% by cath, 50-55% +WMA by echo.  . 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 ([place]) ~ 2015  . Morbid obesity ([place])   . NSTEMI (non-ST elevated myocardial infarction) ([place]) 04/14/2016  .  Obstructive sleep apnea of adult    appt for cpap 8-8 to adjust settings no cpap at this time  . On supplemental oxygen therapy    uses at bedtime or exposed to heat, 2Liters  . Peripheral neuropathy    feet and toes  . Peripheral vascular disease ([place])   . RLS (restless legs syndrome)   . Spinal stenosis   . Type II diabetes mellitus ([place])   . Vitamin D deficiency   . Walking pneumonia   . Wears partial dentures    lower     SURGICAL HISTORY: Past Surgical History:  Procedure Laterality Date  . APPENDECTOMY    . BASAL CELL CARCINOMA EXCISION Left    leg  . BIOPSY  12/25/2019   Procedure: BIOPSY;  Surgeon: Lavena Bullion, DO;  Location: [facility];  Service: Gastroenterology;;  EGD and COLON  . CARDIAC CATHETERIZATION N/A 04/15/2016   Procedure: Left Heart Cath and Coronary Angiography;  Surgeon: Belva Crome, MD;  [facility];  Service: Cardiovascular;  Laterality: N/A;  . CARDIAC CATHETERIZATION N/A 04/15/2016   Procedure: Coronary Stent Intervention;  Surgeon: Belva Crome, MD;  Location: [facility];  Service: Cardiovascular;  Laterality: N/A;  Mid LAD Mid CFX  . CARPAL TUNNEL RELEASE Right    with trigger finger release  . CATARACT EXTRACTION, BILATERAL Bilateral   . COLONOSCOPY WITH PROPOFOL N/A 12/25/2019   Procedure: COLONOSCOPY WITH PROPOFOL;  Surgeon: Lavena Bullion, DO;  [facility];  Service: Gastroenterology;  Laterality: N/A;  . DEBRIDEMENT TENNIS ELBOW Right   . ESOPHAGOGASTRODUODENOSCOPY (EGD) WITH PROPOFOL N/A 12/25/2019   Procedure: ESOPHAGOGASTRODUODENOSCOPY (EGD) WITH PROPOFOL;  Surgeon: Lavena Bullion, DO;  [facility];  Service: Gastroenterology;  Laterality: N/A;  . LUMBAR DISC SURGERY  X 2  . MELANOMA EXCISION Left    "towards the back of my neck"  . POLYPECTOMY  12/25/2019   Procedure: POLYPECTOMY;  Surgeon: Lavena Bullion, DO;  [facility];  Service: Gastroenterology;;  .  SHOULDER ARTHROSCOPY W/ ROTATOR CUFF REPAIR Left   . SHOULDER OPEN ROTATOR CUFF REPAIR Right   . TOTAL KNEE ARTHROPLASTY Bilateral      SOCIAL HISTORY: Social History   Socioeconomic History  . Marital status: Widowed    Spouse name: Not on file  . Number of children: 1  . Years of education: Not on file  . Highest education level: Not on file  Occupational History  . Occupation: retired  Tobacco Use  . Smoking status: Former Smoker    Packs/day: 2.50    Years: 25.00    Pack years: 62.50    Types: Cigarettes    Quit date: 11/11/1983    Years since quitting: 36.5  . Smokeless tobacco: Never Used  Vaping Use  . Vaping Use: Never used  Substance and Sexual Activity  . Alcohol use: No    Alcohol/week: 0.0 standard drinks  . Drug use: No  . Sexual activity: Not Currently  Other Topics Concern  . Not on file  Social History Narrative  . Not on file   Social Determinants of Health   Financial Resource Strain: Not on file  Food Insecurity: Not on file  Transportation Needs: Not on file  Physical Activity: Not on file  Stress: Not on file  Social Connections: Not on file  Intimate Partner Violence: Not on file     FAMILY HISTORY: Family History  Problem Relation Age of Onset  . Heart disease Mother   . Heart attack Mother   . Kidney disease Father   . AAA (abdominal aortic aneurysm) Father   . Parkinson's disease Father   . Cancer Brother        type unknown  . Heart disease Son   . Cancer Sister        female   . Alzheimer's disease Sister   . Colon cancer Neg Hx   . Colon polyps Neg Hx   . Esophageal cancer Neg Hx   . Pancreatic cancer Neg Hx   . Stomach cancer Neg Hx   . Liver disease Neg Hx   . Diabetes Neg Hx      ALLERGIES:   is allergic to brilinta [ticagrelor], aspirin, nsaids, minocycline hcl, oruvail [ketoprofen], tricor [fenofibrate], vasotec [enalaprilat], and zinc.   MEDICATIONS:  Current Outpatient Medications  Medication Sig Dispense  Refill  . acetaminophen (TYLENOL) 325 MG tablet Take 2 tablets (650 mg total) by mouth every 4 (four) hours as needed for headache or mild  pain.    . albuterol (VENTOLIN HFA) 108 (90 Base) MCG/ACT inhaler Inhale 2 puffs into the lungs every 4 (four) hours as needed for wheezing or shortness of breath. 18 g 3  . amoxicillin (AMOXIL) 875 MG tablet Take 875 mg by mouth 2 (two) times daily.    . Ascorbic Acid (VITAMIN C) 1000 MG tablet Take 1,000 mg by mouth daily.    Marland Kitchen aspirin EC 81 MG EC tablet Take 1 tablet (81 mg total) by mouth daily.    Marland Kitchen atorvastatin (LIPITOR) 80 MG tablet TAKE 1 TABLET (80 MG TOTAL) BY MOUTH DAILY AT 6 PM. 90 tablet 2  . Budeson-Glycopyrrol-Formoterol (BREZTRI AEROSPHERE) 160-9-4.8 MCG/ACT AERO Inhale 2 puffs into the lungs in the morning and at bedtime. 5.9 g 2  . buPROPion (WELLBUTRIN XL) 300 MG 24 hr tablet TAKE 1 TABLET (300 MG TOTAL) BY MOUTH DAILY FOR MOOD 90 tablet 3  . Cholecalciferol 125 MCG (5000 UT) capsule Take 5,000 Units by mouth 2 (two) times daily.     . Continuous Blood Gluc Receiver (FREESTYLE LIBRE 14 DAY READER) DEVI 1 Device by Does not apply route every 14 (fourteen) days. 2 each 4  . Continuous Blood Gluc Sensor (FREESTYLE LIBRE 14 DAY SENSOR) MISC Apply 1 application topically every 14 (fourteen) days. 2 each 4  . diclofenac sodium (VOLTAREN) 1 % GEL Apply 4 g topically 4 (four) times daily. (Patient taking differently: Apply 4 g topically 4 (four) times daily as needed (pain).) 100 g 3  . escitalopram (LEXAPRO) 20 MG tablet Take 1 tablet (20 mg total) by mouth daily. 90 tablet 3  . ferrous sulfate 325 (65 FE) MG tablet Take 325 mg by mouth 2 (two) times daily with a meal.     . fluconazole (DIFLUCAN) 100 MG tablet Take      1 tablet     Daily       for Oral Thrush (Yeast) Infection 14 tablet 3  . furosemide (LASIX) 40 MG tablet Take 40 mg by mouth daily as needed for fluid.     Marland Kitchen gabapentin (NEURONTIN) 100 MG capsule Take 1 to 2 capsules 3 to 4 x /day as  needed for Painful Diabetic Neuropathy (Patient taking differently: Take 200 mg by mouth 2 (two) times daily.) 360 capsule 3  . Insulin Glargine (BASAGLAR KWIKPEN) 100 UNIT/ML INJECT 70 UNITS DAILY FOR DIABETES 60 mL 3  . Insulin Pen Needle (BD PEN NEEDLE NANO U/F) 32G X 4 MM MISC USE AS DIRECTED 4 TIMES A DAY 400 each 3  . ketoconazole (NIZORAL) 2 % cream APPLY TO AFFECTED AREA TWICE A DAY (Patient taking differently: Apply 1 application topically daily as needed (itching).) 60 g 3  . Lancets (ONETOUCH ULTRASOFT) lancets Use as instructed 100 each 12  . liraglutide (VICTOZA) 18 MG/3ML SOPN Inject 1.2 mg into the skin every morning. 6 mL 3  . metoprolol succinate (TOPROL-XL) 25 MG 24 hr tablet Take 1 tablet (25 mg total) by mouth at bedtime. 90 tablet 3  . nitroGLYCERIN (NITROSTAT) 0.4 MG SL tablet Place 1 tablet (0.4 mg total) under the tongue every 5 (five) minutes x 3 doses as needed for chest pain. 25 tablet 4  . NOVOLOG FLEXPEN 100 UNIT/ML FlexPen INJECT 50 TO 75 UNITS 3 X /DAY BEFORE MEALS AS DIRECTED FOR DIABETES (Patient taking differently: Inject 20-25 Units into the skin 3 (three) times daily before meals. BS<150 Take 20 units BS>150 Take 25 units) 15 mL 4  . ONETOUCH  VERIO test strip USE AS INSTRUCTED TO CHECK SUGAR 2 TIMES DAILY. 200 each 4  . pantoprazole (PROTONIX) 40 MG tablet Take 1 tablet (40 mg total) by mouth 2 (two) times daily before a meal. 180 tablet 1  . Polyethylene Glycol 400 (BLINK TEARS OP) Place 1 drop into both eyes daily as needed (dry eyes).     Marland Kitchen rOPINIRole (REQUIP) 3 MG tablet TAKE 1 TABLET 4 TIMES A DAY FOR RESTLESS LEGS 360 tablet 3  . tamsulosin (FLOMAX) 0.4 MG CAPS capsule Take 1 capsule (0.4 mg total) by mouth daily. 30 capsule 0  . tiZANidine (ZANAFLEX) 4 MG tablet TAKE 1 TABLET AT BEDTIME IF NEEDED FOR MUSCLE SPASMS 90 tablet 0  . triamcinolone (NASACORT) 55 MCG/ACT AERO nasal inhaler Place 2 sprays into the nose at bedtime. 48 g 3  . vitamin B-12  (CYANOCOBALAMIN) 1000 MCG tablet Take 1,000 mcg by mouth daily.    . vitamin E (VITAMIN E) 180 MG (400 UNITS) capsule Take 400 Units by mouth daily.     No current facility-administered medications for this visit.   REVIEW OF SYSTEMS:   A 10+ POINT REVIEW OF SYSTEMS WAS OBTAINED including neurology, dermatology, psychiatry, cardiac, respiratory, lymph, extremities, GI, GU, Musculoskeletal, constitutional, breasts, reproductive, HEENT.  All pertinent positives are noted in the HPI.  All others are negative.   PHYSICAL EXAMINATION: ECOG PERFORMANCE STATUS: 2 - Symptomatic, <50% confined to bed  There were no vitals filed for this visit. There were no vitals filed for this visit. There is no height or weight on file to calculate BMI.  GENERAL:alert, in no acute distress and comfortable SKIN: no acute rashes, no significant lesions EYES: conjunctiva are pink and non-injected, sclera anicteric OROPHARYNX: MMM, no exudates, no oropharyngeal erythema. Ulcers or Candida, also possibly herpetic.  NECK: supple, no JVD LYMPH:  no palpable lymphadenopathy in the cervical, axillary or inguinal regions LUNGS: clear to auscultation b/l with normal respiratory effort HEART: regular rate & rhythm ABDOMEN:  normoactive bowel sounds , non tender, not distended. No palpable hepatosplenomegaly.  Extremity: no pedal edema PSYCH: alert & oriented x 3 with fluent speech NEURO: no focal motor/sensory deficits  LABORATORY DATA:  I have reviewed the data as listed  CBC Latest Ref Rng & Units 05/12/2020 04/16/2020 02/08/2020  WBC 4.0 - 10.5 K/uL 1.9(L) 1.8(L) 2.4(L)  Hemoglobin 12.0 - 15.0 g/dL 8.9(L) 9.3(L) 9.2(L)  Hematocrit 36.0 - 46.0 % 28.0(L) 28.2(L) 29.5(L)  Platelets 150 - 400 K/uL 139(L) 128(L) 120(L)   . CBC    Component Value Date/Time   WBC 1.9 (L) 05/12/2020 0715   RBC 2.65 (L) 05/12/2020 0715   HGB 8.9 (L) 05/12/2020 0715   HGB 9.1 (L) 05/15/2009 1032   HCT 28.0 (L) 05/12/2020 0715   HCT  28.2 (L) 10/18/2019 1233   HCT 31.6 (L) 05/15/2009 1032   PLT 139 (L) 05/12/2020 0715   PLT 243 05/15/2009 1032   MCV 105.7 (H) 05/12/2020 0715   MCV 78.8 (L) 05/15/2009 1032   MCH 33.6 05/12/2020 0715   MCHC 31.8 05/12/2020 0715   RDW 15.7 (H) 05/12/2020 0715   RDW 17.2 (H) 05/15/2009 1032   LYMPHSABS 0.6 (L) 05/12/2020 0715   LYMPHSABS 1.2 05/15/2009 1032   MONOABS 0.1 05/12/2020 0715   MONOABS 0.3 05/15/2009 1032   EOSABS 0.1 05/12/2020 0715   EOSABS 0.1 05/15/2009 1032   BASOSABS 0.0 05/12/2020 0715   BASOSABS 0.0 05/15/2009 1032     CMP Latest Ref Rng &  Units 04/16/2020 02/08/2020 01/08/2020  Glucose 65 - 99 mg/dL 68 126(H) 135(H)  BUN 7 - 25 mg/dL _0 Creatinine 0.60 - 0.93 mg/dL 1.14(H) 1.41(H) 1.31(H)  Sodium 135 - 146 mmol/L 145 141 144  Potassium 3.5 - 5.3 mmol/L 4.3 4.8 5.1  Chloride 98 - 110 mmol/L 108 106 106  CO2 20 - 32 mmol/L 32 28 27  Calcium 8.6 - 10.4 mg/dL 9.2 9.4 9.5  Total Protein 6.1 - 8.1 g/dL 6.4 6.3(L) 6.1  Total Bilirubin 0.2 - 1.2 mg/dL 0.4 0.3 0.5  Alkaline Phos 38 - 126 U/L - 71 -  AST 10 - 35 U/L 9(L) 10(L) 11  ALT 6 - 29 U/L _1 05/12/2020   05/12/2020     RADIOGRAPHIC STUDIES: I have personally reviewed the radiological images as listed and agreed with the findings in the report. CT Biopsy  Result Date: 05/12/2020 INDICATION: Pancytopenia of uncertain etiology. Please perform CT-guided bone marrow biopsy tissue diagnostic purposes. EXAM: CT-GUIDED BONE MARROW BIOPSY AND ASPIRATION MEDICATIONS: None ANESTHESIA/SEDATION: Fentanyl 100 mcg IV; Versed 2 mg IV Sedation Time: 10 Minutes; The patient was continuously monitored during the procedure by the interventional radiology nurse under my direct supervision. COMPLICATIONS: None immediate. PROCEDURE: Informed consent was obtained from the patient following an explanation of the procedure, risks, benefits and alternatives. The patient understands, agrees and consents for the  procedure. All questions were addressed. A time out was performed prior to the initiation of the procedure. The patient was positioned prone and non-contrast localization CT was performed of the pelvis to demonstrate the iliac marrow spaces. The operative site was prepped and draped in the usual sterile fashion. Under sterile conditions and local anesthesia, a 22 gauge spinal needle was utilized for procedural planning. Next, an 11 gauge coaxial bone biopsy needle was advanced into the left iliac marrow space. Needle position was confirmed with CT imaging. Initially, a bone marrow aspiration was performed. Next, a bone marrow biopsy was obtained with the 11 gauge outer bone marrow device. Samples were prepared with the cytotechnologist and deemed adequate. The needle was removed and superficial hemostasis was obtained with manual compression. A dressing was applied. The patient tolerated the procedure well without immediate post procedural complication. IMPRESSION: Successful CT guided left iliac bone marrow aspiration and core biopsy. Electronically Signed   By: Sandi Mariscal M.D.   On: 05/12/2020 10:16   CT BONE MARROW BIOPSY & ASPIRATION  Result Date: 05/12/2020 INDICATION: Pancytopenia of uncertain etiology. Please perform CT-guided bone marrow biopsy tissue diagnostic purposes. EXAM: CT-GUIDED BONE MARROW BIOPSY AND ASPIRATION MEDICATIONS: None ANESTHESIA/SEDATION: Fentanyl 100 mcg IV; Versed 2 mg IV Sedation Time: 10 Minutes; The patient was continuously monitored during the procedure by the interventional radiology nurse under my direct supervision. COMPLICATIONS: None immediate. PROCEDURE: Informed consent was obtained from the patient following an explanation of the procedure, risks, benefits and alternatives. The patient understands, agrees and consents for the procedure. All questions were addressed. A time out was performed prior to the initiation of the procedure. The patient was positioned prone and  non-contrast localization CT was performed of the pelvis to demonstrate the iliac marrow spaces. The operative site was prepped and draped in the usual sterile fashion. Under sterile conditions and local anesthesia, a 22 gauge spinal needle was utilized for procedural planning. Next, an 11 gauge coaxial bone biopsy needle was advanced into the left iliac marrow space. Needle position was confirmed with CT imaging. Initially, a bone  marrow aspiration was performed. Next, a bone marrow biopsy was obtained with the 11 gauge outer bone marrow device. Samples were prepared with the cytotechnologist and deemed adequate. The needle was removed and superficial hemostasis was obtained with manual compression. A dressing was applied. The patient tolerated the procedure well without immediate post procedural complication. IMPRESSION: Successful CT guided left iliac bone marrow aspiration and core biopsy. Electronically Signed   By: Sandi Mariscal M.D.   On: 05/12/2020 10:16     ASSESSMENT & PLAN:   Janet Choi is a 80 y.o. female with:  1. 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 Though the bone marrow biopsy did not show overt dyspoiesis based on morphology cannot r/o MDS evolving to AML as opposed to denovo AML. Cytogenetics - 46XX AML/MDS FISH panel unrevealing  2. Iron deficiency - iron absent on BM Bx 3. CKD 4.  Patient Active Problem List   Diagnosis Date Noted  . E. coli UTI 12/29/2019  . Acute renal failure ([place])   . Acute lower UTI 12/28/2019  . AKI (acute kidney injury) ([place])   . Obesity, Class III, BMI 40-49.9 (morbid obesity) ([place])   . Hypotension due to hypovolemia   . Acute pulmonary edema ([place]) 12/25/2019  . Acute on chronic respiratory failure with hypoxia ([place]) 12/25/2019  . Acute diastolic heart failure ([place]) 12/25/2019  . Diverticulosis of colon without hemorrhage   . Polyp of ascending colon   . Polyp of sigmoid colon   . Tortuous colon    . Gastritis and gastroduodenitis   . Hiatal hernia   . Anemia 11/08/2019  . Thrombocytopenia ([place]) 11/08/2019  . Neutropenia ([place]) 11/08/2019  . Chronic diarrhea 10/25/2019  . Pancreatic insufficiency 10/15/2019  . Pancytopenia ([place]) 10/15/2019  . Long term (current) use of insulin ([place]) 09/07/2019  . Solitary pulmonary nodule 08/29/2019  . Chronic bronchitis ([place]) 08/29/2019  . 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 ([place]) 08/07/2019  . Insulin dependent type 2 diabetes mellitus ([place]) 08/07/2019  . Acute on chronic diastolic CHF (congestive heart failure) ([place]) 07/30/2019  . 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 DJD (degenerative joint disease) of knee 09/11/2018  . Diabetic polyneuropathy associated with type 2 diabetes mellitus ([place]) 06/27/2018  . Upper airway cough syndrome 01/24/2018  . DJD (degenerative joint disease), lumbosacral 06/02/2017  . CAD S/P percutaneous coronary angioplasty   . History of non-ST elevation myocardial infarction (NSTEMI) 04/14/2016  . CKD stage 3 due to type 2 diabetes mellitus ([place]) 04/14/2016  . Iron deficiency anemia 08/15/2015  . OSA and COPD overlap syndrome ([place]) 04/16/2015  . RLS (restless legs syndrome) 03/20/2014  . Type 2 diabetes mellitus with hyperlipidemia ([place]) 03/20/2014  . Medication management 09/12/2013  . Morbid obesity due to excess calories ([place]) 05/14/2013  . Vitamin D deficiency 05/14/2013  . Hyperlipidemia associated with type 2 diabetes mellitus ([place]) 02/24/2009  . Essential hypertension 02/24/2009  . Diverticulosis of large intestine 02/24/2009  . History of colonic polyps 02/24/2009    PLAN: -Discussed pt labwork today, 05/16/20; WBC dropping to 1.9k with ANC of 1.1k, hgb of 8.9 -Discussed 05/12/2020 BM Bx; Slightly hypercellular bone marrow for age with evolving acute myeloid leukemia -Discussed 05/12/2020 Flow Pathology Report ([id number]); significant myeloblastic population  identified (20%) -Advised pt of concerns of myeloid blasts--either Denovo acute myeloid leukemia or possibility of myelodysplasia (MDS) transferring into AML -Advise pt we are concerned of evidence of de novo acute myeloid leukemia  or MDS evolution to AML. -Advise pt only cure for AML is bone marrow transfer; pt is not a candidate for this.  -Advise pt standard treatment for AML in pt is high-dose induction chemotherapy; pt is not suitable for this given her age and burden of medical co-morbidities. Patient is also not inclined on very aggressive treatment options. -Advise pt that our best available treatment is hypomethylating agents (Vidaza or decitabine) combined with Venetoclax or best supportive cares through hospice. - The pt desires to not undergo harsh treatment or chemo. She is unsure if Vidaza regimen would help her at this time and whether she would want to pursue the burden of treatment. -Advise pt that the Vidaza is given 5-7 days every 4 weeks, either IV or Gonzales injection, every month. The Venetoclax is a pill. This can cause fatigue and drop blood counts. It may also affect other major organs.  -The pt expressed her desires to live a life full of high quality and reduced quantity over reduced quality and higher quantity at this time. -Advise pt that genetic tests will allow for risk classification of AML.-- these were subsequently available and are unrevealing of additional data. -Advise pt that progression of AML is typically weeks to a few months. -Advise pt that blood transfusions would allow for a time-supported "therapy". -Advise pt of the role of Hospice and supportive cares as opposed to life-prolonging measures. -Advise pt the IV iron may have role in increasing quality of life temporarily and combating fatigue if it delays transfusion needs. -Advised pt of the role of preventive antibiotics for combating frequent infections, thrush, cold sores -Advise pt that it is her personal  choice to continue f/u with PCP (Dr. Anitra Lauth) for check-up, Pulmonologist, and Cardiologist- the overall goal would to be to reduce her burden of care. -Will see back for blood transfusion and labs in 3 weeks.  -Will see back for IV Injectafer weekly x2 ASAP  FOLLOW UP: IV Injectafer weekly x 2 doses ASAP Plz schedule for labs and PRBC transfusion x 1 in 3 weeks   The total time spent in the appt was 60 minutes and more than 50% was on counseling and direct patient cares.  All of the patient's questions were answered with apparent satisfaction. The patient knows to call the clinic with any problems, questions or concerns.   Sullivan Lone MD [place] AAHIVMS [facility] Hematology/Oncology Physician [facility]  (Office):       [phone] (Work cell):  [phone] (Fax):           [phone]  05/16/2020 8:57 AM  I, Yevette Edwards, am acting as a scribe for Dr. Sullivan Lone.   .I have reviewed the above documentation for accuracy and completeness, and I agree with the above. Brunetta Genera MD

## 2020-05-19 ENCOUNTER — Telehealth: Payer: Self-pay | Admitting: Hematology

## 2020-05-19 ENCOUNTER — Telehealth: Payer: Self-pay | Admitting: *Deleted

## 2020-05-19 ENCOUNTER — Encounter: Payer: Self-pay | Admitting: Hematology

## 2020-05-19 ENCOUNTER — Encounter (HOSPITAL_COMMUNITY): Payer: Self-pay | Admitting: Hematology

## 2020-05-19 ENCOUNTER — Ambulatory Visit: Payer: PPO | Admitting: Emergency Medicine

## 2020-05-19 ENCOUNTER — Ambulatory Visit: Payer: PPO | Admitting: Hematology

## 2020-05-19 NOTE — Telephone Encounter (Signed)
Please advise on patient mychart message  Hello, Janet [facility]oi (March 26, 1941) has an appointment tomorrow. I just spoke with someone from your office asking if she had received her bipap machine. She has not, and will not receive it until Jan 24. 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 Would it be helpful to keep the appointment for tomorrow just to touch base regarding her future pulmonary treatment with this new AML diagnosis/prognosis? Or reschedule for after January 24?  I'm not sure she is motivated to get the bipap machine at this point...  :-(  Thank you,  Ines Bloomer  [facility] sister)  [phone]

## 2020-05-19 NOTE — Telephone Encounter (Signed)
Called and spoke with patient's sister, Ines Bloomer, listed on DPR regarding Bipap.  She stated she did not know whether her sister had received the bipap machine or not.  She stated she would call her and find out.  If she has received the machine, they will just keep the appointment for tomorrow, 05/19/2020, if she has not received the machine, she will call back and reschedule the appointment.

## 2020-05-19 NOTE — Telephone Encounter (Signed)
I think it would be ok to postpone the visit until after 1/24. I am hopeful that she will still want to use the BiPAP as I believe it will enhance her breathing and her quality of life.

## 2020-05-19 NOTE — Telephone Encounter (Signed)
Scheduled per 01/07 los, patient has been called and notified.  

## 2020-05-20 ENCOUNTER — Ambulatory Visit: Payer: PPO | Admitting: Emergency Medicine

## 2020-05-21 ENCOUNTER — Other Ambulatory Visit: Payer: Self-pay | Admitting: Hematology

## 2020-05-23 ENCOUNTER — Inpatient Hospital Stay: Payer: PPO

## 2020-05-23 ENCOUNTER — Other Ambulatory Visit: Payer: Self-pay

## 2020-05-23 VITALS — BP 131/53 | HR 67 | Temp 98.9°F | Resp 18

## 2020-05-23 DIAGNOSIS — C9200 Acute myeloblastic leukemia, not having achieved remission: Secondary | ICD-10-CM | POA: Diagnosis not present

## 2020-05-23 DIAGNOSIS — D508 Other iron deficiency anemias: Secondary | ICD-10-CM

## 2020-05-23 MED ORDER — SODIUM CHLORIDE 0.9 % IV SOLN
Freq: Once | INTRAVENOUS | Status: AC
Start: 2020-05-23 — End: 2020-05-23
  Filled 2020-05-23: qty 250

## 2020-05-23 MED ORDER — ACETAMINOPHEN 325 MG PO TABS
650.0000 mg | ORAL_TABLET | Freq: Once | ORAL | Status: AC
  Administered 2020-05-23: 650 mg via ORAL

## 2020-05-23 MED ORDER — INFLUENZA VAC A&B SA ADJ QUAD 0.5 ML IM PRSY
0.5000 mL | PREFILLED_SYRINGE | Freq: Once | INTRAMUSCULAR | Status: AC
  Administered 2020-05-23: 0.5 mL via INTRAMUSCULAR

## 2020-05-23 MED ORDER — LORATADINE 10 MG PO TABS
ORAL_TABLET | ORAL | Status: AC
  Filled 2020-05-23: qty 1

## 2020-05-23 MED ORDER — LORATADINE 10 MG PO TABS
10.0000 mg | ORAL_TABLET | Freq: Once | ORAL | Status: AC
  Administered 2020-05-23: 10 mg via ORAL

## 2020-05-23 MED ORDER — ACETAMINOPHEN 325 MG PO TABS
ORAL_TABLET | ORAL | Status: AC
  Filled 2020-05-23: qty 2

## 2020-05-23 MED ORDER — INFLUENZA VAC A&B SA ADJ QUAD 0.5 ML IM PRSY
PREFILLED_SYRINGE | INTRAMUSCULAR | Status: AC
  Filled 2020-05-23: qty 0.5

## 2020-05-23 MED ORDER — SODIUM CHLORIDE 0.9 % IV SOLN
200.0000 mg | Freq: Once | INTRAVENOUS | Status: AC
  Administered 2020-05-23: 200 mg via INTRAVENOUS
  Filled 2020-05-23: qty 200

## 2020-05-23 NOTE — Patient Instructions (Signed)

## 2020-05-26 ENCOUNTER — Other Ambulatory Visit: Payer: Self-pay | Admitting: Internal Medicine

## 2020-05-26 DIAGNOSIS — E1142 Type 2 diabetes mellitus with diabetic polyneuropathy: Secondary | ICD-10-CM

## 2020-05-30 ENCOUNTER — Other Ambulatory Visit: Payer: Self-pay

## 2020-05-30 ENCOUNTER — Inpatient Hospital Stay: Payer: PPO

## 2020-05-30 VITALS — BP 129/61 | HR 65 | Temp 98.0°F | Resp 18

## 2020-05-30 DIAGNOSIS — C9200 Acute myeloblastic leukemia, not having achieved remission: Secondary | ICD-10-CM | POA: Diagnosis not present

## 2020-05-30 DIAGNOSIS — D508 Other iron deficiency anemias: Secondary | ICD-10-CM

## 2020-05-30 MED ORDER — ACETAMINOPHEN 325 MG PO TABS
650.0000 mg | ORAL_TABLET | Freq: Once | ORAL | Status: AC
  Administered 2020-05-30: 650 mg via ORAL

## 2020-05-30 MED ORDER — SODIUM CHLORIDE 0.9 % IV SOLN
Freq: Once | INTRAVENOUS | Status: AC
  Filled 2020-05-30: qty 250

## 2020-05-30 MED ORDER — LORATADINE 10 MG PO TABS
10.0000 mg | ORAL_TABLET | Freq: Once | ORAL | Status: AC
  Administered 2020-05-30: 10 mg via ORAL

## 2020-05-30 MED ORDER — LORATADINE 10 MG PO TABS
ORAL_TABLET | ORAL | Status: AC
  Filled 2020-05-30: qty 1

## 2020-05-30 MED ORDER — SODIUM CHLORIDE 0.9 % IV SOLN
200.0000 mg | Freq: Once | INTRAVENOUS | Status: AC
  Administered 2020-05-30: 200 mg via INTRAVENOUS
  Filled 2020-05-30: qty 200

## 2020-05-30 MED ORDER — ACETAMINOPHEN 325 MG PO TABS
ORAL_TABLET | ORAL | Status: AC
  Filled 2020-05-30: qty 2

## 2020-05-30 NOTE — Patient Instructions (Signed)

## 2020-06-05 ENCOUNTER — Telehealth (INDEPENDENT_AMBULATORY_CARE_PROVIDER_SITE_OTHER): Payer: PPO | Admitting: Cardiovascular Disease

## 2020-06-05 ENCOUNTER — Encounter: Payer: Self-pay | Admitting: Cardiovascular Disease

## 2020-06-05 VITALS — Ht 61.0 in | Wt 240.0 lb

## 2020-06-05 DIAGNOSIS — I5033 Acute on chronic diastolic (congestive) heart failure: Secondary | ICD-10-CM

## 2020-06-05 DIAGNOSIS — I2510 Atherosclerotic heart disease of native coronary artery without angina pectoris: Secondary | ICD-10-CM

## 2020-06-05 DIAGNOSIS — Z9861 Coronary angioplasty status: Secondary | ICD-10-CM

## 2020-06-05 DIAGNOSIS — I10 Essential (primary) hypertension: Secondary | ICD-10-CM

## 2020-06-05 NOTE — Progress Notes (Addendum)
Virtual Visit via Telephone Note   This visit type was conducted due to national recommendations for restrictions regarding the COVID-19 Pandemic (e.g. social distancing) in an effort to limit this patient's exposure and mitigate transmission in our community.  Due to her co-morbid illnesses, this patient is at least at moderate risk for complications without adequate follow up.  This format is felt to be most appropriate for this patient at this time.  The patient did not have access to video technology/had technical difficulties with video requiring transitioning to audio format only (telephone).  All issues noted in this document were discussed and addressed.  No physical exam could be performed with this format.  Please refer to the patient's chart for her  consent to telehealth for [facility].   The patient was identified using 2 identifiers.  Date:  06/05/2020   ID:  Janet Choi, DOB December 23, 1940, MRN [id number]  Patient Location: Home Provider Location: Office/Clinic  PCP:  Janet Pinto, MD  Cardiologist:  Janet Latch, MD  Electrophysiologist:  None   Evaluation Performed:  Follow-Up Visit  Chief Complaint:  AML, hypertension  History of Present Illness:     The patient does not have symptoms concerning for COVID-19 infection (fever, chills, cough, or new shortness of breath).   Janet Choi is a 80 y.o. female with CAD s/p NSTEMI with PCI of the LAD and LCx, hypertensive heart disease, hyperlipidemia, OSA, diabetes, morbid obesity and CKD III who presents for follow up.  [facility] to [facility] with NSTEMI.  She underwent LHC that revealed 95-99% mid LAD and LCx lesions that were successfully opened with Onyx Resolute DESs. Rash PCI of the left circumflex artery was complicated by microembolization. Left ventriculogram revealed LVEF 40-50%.  Subsequent echo revealed LVEF 50-55% with akinesis of the distal septal and apical myocardium. There is  also akinesis of the apical inferior myocardium and grade 1 diastolic dysfunction.   She was readmitted with chest pain on the same day she was discharged from [facility].  This was not felt to be recurrent ischemia but ticagrelor was switched to clopidogrel.  During that hospitalization Prozac was stopped due to interaction with clopidogrel. She followed up with [facility] on 04/26/16 and was feeling weak but otherwise well.  She was again seen in the ED with chest pain 2 days later and Imdur was added to her regimen.  In August 2019 she had an episode of chest pain and shortness of breath that occurred while at the beach.  She was seen at [facility] and had a nuclear stress test that revealed LVEF 68% with no ischemia.  Since her last appointment [facility] has continued to struggle with falls.  She has had 2 falls in the last month.  Yesterday after eating she became very dizzy.  She was also short of breath.  She is unsure what her blood pressure was at the time.  Her dizziness occurred while sitting and there is no associated chest pain.  It lasted approximately 10 minutes.  This was followed by diarrhea when she was able to get up, though she did not have any abdominal pain or discomfort.  She notes that when she walks she often has to hold onto something and has not been feeling steady on her feet.  She has not been feeling very well for the last several months.  She notes that her blood sugar has been running very high and it was to 412  before she ate her meal that day.  She has been struggling with problems with her finger and has been getting steroid injections in that finger.  Since then her blood sugars have been more out of control.  She has not had any lower extremity edema, orthopnea, or PND.  She uses Lasix very rarely.  At her last appointment [facility] was struggling with dizziness and her blood pressure was low.  Therefore losartan was reduced.  Since then her blood pressure has  been stable.  She denies any lightheadedness or dizziness.  She has not had any chest pain and her breathing is good.  She is on 2 L of supplemental oxygen.  Since her last appointment she was diagnosed with AML.  It was first suspected because of low blood count.  She isn't a candidate for chemo or XRT due to age or comorbidities.  She is being followed by [facility] and receiving iron infusions.  She was offered treatment with Vidaza  She understands importance of trying to avoid infection.  Overall she is feeling well and is without complaint at this time.  She notes that her prognosis is poor and prefers quality of life over quantity.   Past Medical History:  Diagnosis Date  . Anemia    hx (04/14/2016)  . Anxiety   . Arthritis    "severe in my back; hands; ankles" (04/14/2016)  . Atypical chest pain 01/24/2018   Midline pain absent supine "constant" daytime since 12/31/17 > resolved as of 02/20/2018 on gerd/ gas diet   . Basal cell carcinoma    "several burned off; one cut off"  . CAD in native artery    a. NSTEMI 04/2016 - s/p DES toLAD and LCx. PCI to LCx notable for microembolization during cath.  . Chest pain- reslved with stopping Brilinta now on Plaix 04/16/2016  . Chronic lower back pain   . CKD (chronic kidney disease), stage III (HCC)    stage 3  . Complication of anesthesia   . COPD (chronic obstructive pulmonary disease) ([place])   . Depression   . Diverticulosis   . DJD (degenerative joint disease)   . Family history of adverse reaction to anesthesia   . Gallstones   . GERD (gastroesophageal reflux disease)   . History of gout   . History of kidney stones   . Hyperlipidemia   . Hypertension   . IBS (irritable bowel syndrome)   . Ischemic cardiomyopathy    a. 04/2016: EF 40-50% by cath, 50-55% +WMA by echo.  . 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 ([place]) ~ 2015  . Morbid obesity ([place])   . NSTEMI (non-ST elevated myocardial infarction) ([place]) 04/14/2016  . Obstructive sleep  apnea of adult    appt for cpap 8-8 to adjust settings no cpap at this time  . On supplemental oxygen therapy    uses at bedtime or exposed to heat, 2Liters  . Peripheral neuropathy    feet and toes  . Peripheral vascular disease ([place])   . RLS (restless legs syndrome)   . Spinal stenosis   . Type II diabetes mellitus ([place])   . Vitamin D deficiency   . Walking pneumonia   . Wears partial dentures    lower    Past Surgical History:  Procedure Laterality Date  . APPENDECTOMY    . BASAL CELL CARCINOMA EXCISION Left    leg  . BIOPSY  12/25/2019   Procedure: BIOPSY;  Surgeon: Lavena Bullion, DO;  Location: [facility];  Service: Gastroenterology;;  EGD and COLON  . CARDIAC CATHETERIZATION N/A 04/15/2016   Procedure: Left Heart Cath and Coronary Angiography;  Surgeon: Belva Crome, MD;  Location: [facility];  Service: Cardiovascular;  Laterality: N/A;  . CARDIAC CATHETERIZATION N/A 04/15/2016   Procedure: Coronary Stent Intervention;  Surgeon: Belva Crome, MD;  Location: [place] [facility];  Service: Cardiovascular;  Laterality: N/A;  Mid LAD Mid CFX  . CARPAL TUNNEL RELEASE Right    with trigger finger release  . CATARACT EXTRACTION, BILATERAL Bilateral   . COLONOSCOPY WITH PROPOFOL N/A 12/25/2019   Procedure: COLONOSCOPY WITH PROPOFOL;  Surgeon: Lavena Bullion, DO;  Location: [facility];  Service: Gastroenterology;  Laterality: N/A;  . DEBRIDEMENT TENNIS ELBOW Right   . ESOPHAGOGASTRODUODENOSCOPY (EGD) WITH PROPOFOL N/A 12/25/2019   Procedure: ESOPHAGOGASTRODUODENOSCOPY (EGD) WITH PROPOFOL;  Surgeon: Lavena Bullion, DO;  Location: [facility];  Service: Gastroenterology;  Laterality: N/A;  . LUMBAR DISC SURGERY  X 2  . MELANOMA EXCISION Left    "towards the back of my neck"  . POLYPECTOMY  12/25/2019   Procedure: POLYPECTOMY;  Surgeon: Lavena Bullion, DO;  Location: [facility];  Service: Gastroenterology;;  . SHOULDER ARTHROSCOPY W/ ROTATOR CUFF REPAIR  Left   . SHOULDER OPEN ROTATOR CUFF REPAIR Right   . TOTAL KNEE ARTHROPLASTY Bilateral      Current Outpatient Medications  Medication Sig Dispense Refill  . acetaminophen (TYLENOL) 325 MG tablet Take 2 tablets (650 mg total) by mouth every 4 (four) hours as needed for headache or mild pain.    Marland Kitchen albuterol (VENTOLIN HFA) 108 (90 Base) MCG/ACT inhaler Inhale 2 puffs into the lungs every 4 (four) hours as needed for wheezing or shortness of breath. 18 g 3  . amoxicillin (AMOXIL) 875 MG tablet Take 875 mg by mouth 2 (two) times daily.    . Ascorbic Acid (VITAMIN C) 1000 MG tablet Take 1,000 mg by mouth daily.    Marland Kitchen aspirin EC 81 MG EC tablet Take 1 tablet (81 mg total) by mouth daily.    Marland Kitchen atorvastatin (LIPITOR) 80 MG tablet TAKE 1 TABLET (80 MG TOTAL) BY MOUTH DAILY AT 6 PM. 90 tablet 2  . Budeson-Glycopyrrol-Formoterol (BREZTRI AEROSPHERE) 160-9-4.8 MCG/ACT AERO Inhale 2 puffs into the lungs in the morning and at bedtime. 5.9 g 2  . buPROPion (WELLBUTRIN XL) 300 MG 24 hr tablet TAKE 1 TABLET (300 MG TOTAL) BY MOUTH DAILY FOR MOOD 90 tablet 3  . Cholecalciferol 125 MCG (5000 UT) capsule Take 5,000 Units by mouth 2 (two) times daily.     . Continuous Blood Gluc Receiver (FREESTYLE LIBRE 14 DAY READER) DEVI 1 Device by Does not apply route every 14 (fourteen) days. 2 each 4  . Continuous Blood Gluc Sensor (FREESTYLE LIBRE 14 DAY SENSOR) MISC Apply 1 application topically every 14 (fourteen) days. 2 each 4  . diclofenac sodium (VOLTAREN) 1 % GEL Apply 4 g topically 4 (four) times daily. (Patient taking differently: Apply 4 g topically 4 (four) times daily as needed (pain).) 100 g 3  . escitalopram (LEXAPRO) 20 MG tablet Take 1 tablet (20 mg total) by mouth daily. 90 tablet 3  . ferrous sulfate 325 (65 FE) MG tablet Take 325 mg by mouth 2 (two) times daily with a meal.     . fluconazole (DIFLUCAN) 100 MG tablet Take      1 tablet     Daily  for Oral Thrush (Yeast) Infection 14 tablet 3  .  furosemide (LASIX) 40 MG tablet Take 40 mg by mouth daily as needed for fluid.     Marland Kitchen gabapentin (NEURONTIN) 100 MG capsule TAKE 1 TO 2 CAPSULES 3 TO 4 X /DAY AS NEEDED FOR PAINFUL DIABETIC NEUROPATHY 360 capsule 3  . Insulin Glargine (BASAGLAR KWIKPEN) 100 UNIT/ML INJECT 70 UNITS DAILY FOR DIABETES 60 mL 3  . Insulin Pen Needle (BD PEN NEEDLE NANO U/F) 32G X 4 MM MISC USE AS DIRECTED 4 TIMES A DAY 400 each 3  . ketoconazole (NIZORAL) 2 % cream APPLY TO AFFECTED AREA TWICE A DAY (Patient taking differently: Apply 1 application topically daily as needed (itching).) 60 g 3  . Lancets (ONETOUCH ULTRASOFT) lancets Use as instructed 100 each 12  . liraglutide (VICTOZA) 18 MG/3ML SOPN Inject 1.2 mg into the skin every morning. 6 mL 3  . metoprolol succinate (TOPROL-XL) 25 MG 24 hr tablet Take 1 tablet (25 mg total) by mouth at bedtime. 90 tablet 3  . nitroGLYCERIN (NITROSTAT) 0.4 MG SL tablet Place 1 tablet (0.4 mg total) under the tongue every 5 (five) minutes x 3 doses as needed for chest pain. 25 tablet 4  . NOVOLOG FLEXPEN 100 UNIT/ML FlexPen INJECT 50 TO 75 UNITS 3 X /DAY BEFORE MEALS AS DIRECTED FOR DIABETES (Patient taking differently: Inject 20-25 Units into the skin 3 (three) times daily before meals. BS<150 Take 20 units BS>150 Take 25 units) 15 mL 4  . ONETOUCH VERIO test strip USE AS INSTRUCTED TO CHECK SUGAR 2 TIMES DAILY. 200 each 4  . pantoprazole (PROTONIX) 40 MG tablet Take 1 tablet (40 mg total) by mouth 2 (two) times daily before a meal. 180 tablet 1  . Polyethylene Glycol 400 (BLINK TEARS OP) Place 1 drop into both eyes daily as needed (dry eyes).     Marland Kitchen rOPINIRole (REQUIP) 3 MG tablet TAKE 1 TABLET 4 TIMES A DAY FOR RESTLESS LEGS 360 tablet 3  . tamsulosin (FLOMAX) 0.4 MG CAPS capsule Take 1 capsule (0.4 mg total) by mouth daily. 30 capsule 0  . tiZANidine (ZANAFLEX) 4 MG tablet TAKE 1 TABLET AT BEDTIME IF NEEDED FOR MUSCLE SPASMS 90 tablet 0  . triamcinolone (NASACORT) 55 MCG/ACT AERO  nasal inhaler Place 2 sprays into the nose at bedtime. 48 g 3  . vitamin B-12 (CYANOCOBALAMIN) 1000 MCG tablet Take 1,000 mcg by mouth daily.    . vitamin E 180 MG (400 UNITS) capsule Take 400 Units by mouth daily.     No current facility-administered medications for this visit.    Allergies:   Brilinta [ticagrelor], Aspirin, Nsaids, Minocycline hcl, Oruvail [ketoprofen], Tricor [fenofibrate], Vasotec [enalaprilat], and Zinc    Social History:  The patient  reports that she quit smoking about 36 years ago. Her smoking use included cigarettes. She has a 62.50 pack-year smoking history. She has never used smokeless tobacco. She reports that she does not drink alcohol and does not use drugs.   Family History:  The patient's family history includes AAA (abdominal aortic aneurysm) in her father; Alzheimer's disease in her sister; Cancer in her brother and sister; Heart attack in her mother; Heart disease in her mother and son; Kidney disease in her father; Parkinson's disease in her father.    ROS:  Please see the history of present illness.   Otherwise, review of systems are positive for nephrolithiasis.   All other systems are reviewed and negative.    PHYSICAL EXAM:  VS:  Ht 5\' 1"  (1.549 m)   Wt 240 lb (108.9 kg)   BMI 45.35 kg/m  , BMI Body mass index is 45.35 kg/m. GENERAL:  Well appearing HEENT: Pupils equal round and reactive, fundi not visualized, oral mucosa unremarkable NECK:  No jugular venous distention, waveform within normal limits, carotid upstroke brisk and symmetric, no bruits, no thyromegaly LUNGS:  Clear to auscultation bilaterally HEART:  RRR.  PMI not displaced or sustained,S1 and S2 within normal limits, no S3, no S4, no clicks, no rubs, no murmurs ABD:  Flat, positive bowel sounds normal in frequency in pitch, no bruits, no rebound, no guarding, no midline pulsatile mass, no hepatomegaly, no splenomegaly EXT:  2 plus pulses throughout, trace edema, no cyanosis no  clubbing SKIN:  No rashes no nodules NEURO:  Cranial nerves II through XII grossly intact, motor grossly intact throughout PSYCH:  Cognitively intact, oriented to person place and time   EKG:  EKG is ordered today. 07/03/18: Sinus rhythm.  Rate 84 bpm.  PACs.  LAD.  Prior anterolateral infarct. Absent R wave progression.   Echo 04/15/16: Study Conclusions  - Left ventricle: The cavity size was normal. There was moderate   focal basal and mild concentric hypertrophy. Systolic function   was normal. The estimated ejection fraction was in the range of   50% to 55%. There is akinesis of the distal septal and apical   myocardium. There is akinesis of the apicalinferior myocardium.   There was an increased relative contribution of atrial   contraction to ventricular filling. Doppler parameters are   consistent with abnormal left ventricular relaxation (grade 1   diastolic dysfunction). Doppler parameters are consistent with   high ventricular filling pressure. - Aortic valve: Mildly to moderately calcified annulus. Mildly   calcified leaflets. - Mitral valve: Calcified annulus. Mildly calcified leaflets .   There was trivial regurgitation. - Atrial septum: No defect or patent foramen ovale was identified.  LHC12/7/17: Non-ST elevation myocardial infarction with identification of 23-76% segmental mid LAD stenosis and focal eccentric 95-99% mid circumflex stenosis.  The first diagonal is small and contains segmental 80% stenosis. The coronaries are otherwise widely patent.  Successful drug-eluting stent using Onyx Resolute to reduce a 95% mid LAD stenosis to 0% with TIMI grade 3 flow. Final stent diameter 3.0 mm.  Successful drug-eluting stent using Onyx Resolute to reduce a 95% mid circumflex stenosis to 0% with TIMI grade 3 flow. Post stent diameter 3.5 mm. Intervention on this vessel was, complicated by microembolization to the the most distal obtuse marginal beyond a  bifurcation.  Mild hypokinesis of the anterior wall. EF 40-50%.  Recent Labs: 07/24/2019: B Natriuretic Peptide 37.1 11/05/2019: Magnesium 1.6; TSH 2.83 04/16/2020: ALT 6; BUN 21; Creat 1.14; Potassium 4.3; Sodium 145 05/12/2020: Hemoglobin 8.9; Platelets 139    Lipid Panel    Component Value Date/Time   CHOL 136 04/16/2020 1649   TRIG 194 (H) 04/16/2020 1649   HDL 39 (L) 04/16/2020 1649   CHOLHDL 3.5 04/16/2020 1649   VLDL 51 (H) 07/25/2019 0224   LDLCALC 70 04/16/2020 1649      Wt Readings from Last 3 Encounters:  06/05/20 240 lb (108.9 kg)  05/16/20 242 lb 3.2 oz (109.9 kg)  05/12/20 238 lb (108 kg)      ASSESSMENT AND PLAN:  #AML:  Unfortunately [facility] has been diagnosed with AML and her prognosis is poor.  She is not a candidate for treatment due to age and comorbidities.  We will continue to be available as needed.  Fortunately right now she is comfortable and doing well.  # CAD s/p NSTEMI:  # Hyperlipidemia: # Hypertriglyceridemia:   [facility] had an NSTEMI 04/2016.  She is doing well and has no angina. She had a nuclear stress test 12/2017 that was negative for ischemia.  Continue aspirin, atorvastatin and Zetia.  It is okay to discontinue all these with the exception of her aspirin for comfort.  # Hypertension: Blood pressure is well have been controlled.  Continue metoprolol.  # Chronic diastolic heart failure:  She has not been struggling with volume lately.  Continue lasix and hypertension regimen as above.   Time spent: 23 minutes-Greater than 50% of this time was spent in counseling, explanation of diagnosis, planning of further management, and coordination of care.  Current medicines are reviewed at length with the patient today.  The patient has concerns regarding medicines.  The following changes have been made:  none Labs/ tests ordered today include:   No orders of the defined types were placed in this encounter.    Disposition:   FU with  Tiffany C. Oval Linsey, MD, [facility] in 6 months or as needed    Signed, [place]. Oval Linsey, MD, [facility]  06/05/2020 10:10 AM    [facility]

## 2020-06-05 NOTE — Patient Instructions (Signed)
Medication Instructions:  Your physician recommends that you continue on your current medications as directed. Please refer to the Current Medication list given to you today.  *If you need a refill on your cardiac medications before your next appointment, please call your pharmacy*  Follow-Up: At [facility], you and your health needs are our priority.  As part of our continuing mission to provide you with exceptional heart care, we have created designated Provider Care Teams.  These Care Teams include your primary Cardiologist (physician) and Advanced Practice Providers (APPs -  Physician Assistants and Nurse Practitioners) who all work together to provide you with the care you need, when you need it.  We recommend signing up for the patient portal called "MyChart".  Sign up information is provided on this After Visit Summary.  MyChart is used to connect with patients for Virtual Visits (Telemedicine).  Patients are able to view lab/test results, encounter notes, upcoming appointments, etc.  Non-urgent messages can be sent to your provider as well.   To learn more about what you can do with MyChart, go to [email or web address].    Your next appointment:   6 month(s)  The format for your next appointment:   In Person  Provider:   Tiffany Galatia, MD     

## 2020-06-06 ENCOUNTER — Other Ambulatory Visit: Payer: Self-pay

## 2020-06-06 ENCOUNTER — Inpatient Hospital Stay: Payer: PPO

## 2020-06-06 ENCOUNTER — Other Ambulatory Visit: Payer: Self-pay | Admitting: Internal Medicine

## 2020-06-06 DIAGNOSIS — C9200 Acute myeloblastic leukemia, not having achieved remission: Secondary | ICD-10-CM | POA: Diagnosis not present

## 2020-06-06 DIAGNOSIS — D649 Anemia, unspecified: Secondary | ICD-10-CM

## 2020-06-06 LAB — CBC WITH DIFFERENTIAL (CANCER CENTER ONLY)
Abs Immature Granulocytes: 0 10*3/uL (ref 0.00–0.07)
Basophils Absolute: 0 10*3/uL (ref 0.0–0.1)
Basophils Relative: 0 %
Eosinophils Absolute: 0.1 10*3/uL (ref 0.0–0.5)
Eosinophils Relative: 8 %
HCT: 30.2 % — ABNORMAL LOW (ref 36.0–46.0)
Hemoglobin: 9.3 g/dL — ABNORMAL LOW (ref 12.0–15.0)
Immature Granulocytes: 0 %
Lymphocytes Relative: 40 %
Lymphs Abs: 0.5 10*3/uL — ABNORMAL LOW (ref 0.7–4.0)
MCH: 33.8 pg (ref 26.0–34.0)
MCHC: 30.8 g/dL (ref 30.0–36.0)
MCV: 109.8 fL — ABNORMAL HIGH (ref 80.0–100.0)
Monocytes Absolute: 0 10*3/uL — ABNORMAL LOW (ref 0.1–1.0)
Monocytes Relative: 2 %
Neutro Abs: 0.7 10*3/uL — ABNORMAL LOW (ref 1.7–7.7)
Neutrophils Relative %: 50 %
Platelet Count: 87 10*3/uL — ABNORMAL LOW (ref 150–400)
RBC: 2.75 MIL/uL — ABNORMAL LOW (ref 3.87–5.11)
RDW: 16.9 % — ABNORMAL HIGH (ref 11.5–15.5)
WBC Count: 1.3 10*3/uL — ABNORMAL LOW (ref 4.0–10.5)
nRBC: 0 % (ref 0.0–0.2)

## 2020-06-06 LAB — CMP (CANCER CENTER ONLY)
ALT: 9 U/L (ref 0–44)
AST: 9 U/L — ABNORMAL LOW (ref 15–41)
Albumin: 3.7 g/dL (ref 3.5–5.0)
Alkaline Phosphatase: 73 U/L (ref 38–126)
Anion gap: 7 (ref 5–15)
BUN: 24 mg/dL — ABNORMAL HIGH (ref 8–23)
CO2: 31 mmol/L (ref 22–32)
Calcium: 9.5 mg/dL (ref 8.9–10.3)
Chloride: 104 mmol/L (ref 98–111)
Creatinine: 1.55 mg/dL — ABNORMAL HIGH (ref 0.44–1.00)
GFR, Estimated: 34 mL/min — ABNORMAL LOW (ref >60)
Glucose, Bld: 351 mg/dL — ABNORMAL HIGH (ref 70–99)
Potassium: 5.2 mmol/L — ABNORMAL HIGH (ref 3.5–5.1)
Sodium: 142 mmol/L (ref 135–145)
Total Bilirubin: 0.4 mg/dL (ref 0.3–1.2)
Total Protein: 6.4 g/dL — ABNORMAL LOW (ref 6.5–8.1)

## 2020-06-06 LAB — SAMPLE TO BLOOD BANK

## 2020-06-06 NOTE — Progress Notes (Unsigned)
Per [name], no indication for blood transfusion at this time as patient's Hgb is 9.3.

## 2020-06-08 ENCOUNTER — Other Ambulatory Visit: Payer: Self-pay | Admitting: Adult Health

## 2020-06-11 DIAGNOSIS — I5032 Chronic diastolic (congestive) heart failure: Secondary | ICD-10-CM | POA: Diagnosis not present

## 2020-06-11 DIAGNOSIS — I2510 Atherosclerotic heart disease of native coronary artery without angina pectoris: Secondary | ICD-10-CM | POA: Diagnosis not present

## 2020-06-11 DIAGNOSIS — G4733 Obstructive sleep apnea (adult) (pediatric): Secondary | ICD-10-CM | POA: Diagnosis not present

## 2020-06-11 NOTE — Progress Notes (Signed)
Assessment and Plan:  Janet Choi was seen today for abdominal pain.  Diagnoses and all orders for this visit:  Generalized abdominal pain Cholelithiasis Suspect episodic flare r/t fatty meal with gallbladder from hx - Murphy's neg at time of exam Sx nearly resolved to baseline at time of exam today  Per patient and family preference will recheck basic labs only Discussed lifestyle changes, low fat diet for gallstones; specific examples given Follow up if recurrent sx as needed -     COMPLETE METABOLIC PANEL WITH GFR -     CBC with Differential/Platelet -     Unable to provide UA today, denies concerning sx  History of renal calculi Push fluids; pain is resolving today; flomax PRN  Gastroesophageal reflux disease with esophagitis without hemorrhage -     pantoprazole (PROTONIX) 40 MG tablet; Take 1 tablet (40 mg total) by mouth 2 (two) times daily as needed. For acid reflux.  Chronic bronchitis; chronic respiratory failure ([place]) Pulm follows; reports breztri causing oral thrush; given anoro sample to try with instructions.  If beneficial can provider script or reach out to pulm -   Polypharmacy Reviewed meds at length today; discontinued atorvastatin; patient continues to feel other meds are beneficial at this time. Continue to review and discontinue as indicated Cautioned supplements; restart 1 at a time, high dose vit C, vitamin D, tumeric notably may contribute to GI upset; monitor for benefit and lack of SE with slow introduction  CKD stage 3 due to type 2 diabetes mellitus (HCC) Increase fluids, avoid NSAIDS, monitor sugars, will monitor  -     COMPLETE METABOLIC PANEL WITH GFR  Further disposition pending results of labs. Discussed med's effects and SE's.   Over 45 minutes of exam, counseling, chart review, and critical decision making was performed.   Future Appointments  Date Time Provider [place]  07/23/2020  2:00 PM Garnet Sierras, NP [facility] None  10/23/2020   2:30 PM Liane Comber, NP [facility] None    ------------------------------------------------------------------------------------------------------------------   HPI BP 134/72   Pulse 79   Temp (!) 97.3 F (36.3 C)   Resp 18   Wt 245 lb (111.1 kg)   SpO2 93%   BMI 46.29 kg/m   79 y.o.female with well controlled T2DM, complex cardiac hx, GERD, pancreatic insufficiency, chronic diarrhea, diverticulosis of colon, superior mesenteric artery stenosis, renal calculi, cholelithiasis with intermittent cholecystitis felt poor surgical candidate due to co morbidities and managing by lifestyle, chronic diarrhea.   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, also iron def anemia. She isn't a candidate for chemo or XRT due to age or comorbidities.  She is receiving iron infusions.  She was offered treatment with Vidaza. She is accompanied by her son and daughter today.   Concerned about abdominal pain last 2 days, improved today. They want to discuss reviewing medications and discontinue if possible, had conversation with [name] and advised life expectancy of 6 months. Son also shares they started several new supplements, superfoods, tumeric, high dose vitamin D, vitamin E 400 mg, B12 tab, high dose vitamin C 6000 mg, patient had noted significant energy benefit with this and reports did tolerate without atypical GI sx for several weeks.   Reports went out to eat Tuesday night, had fried foods (has been advised to avoid due to gallbladder), started feeling bad that evening, generalized. They did stop all supplements. She reports yesterday woke up with generalized abdominal pain "whole stomach hurt" aching upper abdominal pain and  shooting lower abdominal, worse on the R. She was avoiding foods and doing mostly fluids, was feeling better later than day, had a light dinner and did well. Today reports mild residual RLQ tenderness, otherwise back to baseline.   She denies  fever/chills, denies nausea, emesis. Endorses belching, gassiness; denies GERD but admits has been out of pantoprazole and requests refill. She endorses loose stools, actually improved from baseline chronic diarrhea since starting supplements. Denies mucus, blood in stool, dark/tarry stools.   She had colonoscopy and EGD 12/25/2019 showed tubular adenoma without high grade dysplasia, diverticula, tortuous colon, EGD showed small hiatal hernia, gastritis (neg h. Pylori, chronic inflammatory changes).  US abd 10/17/2019 showed Cholelithiasis with positive sonographic Murphy's sign and mild wall thickening these changes would be consistent with acute cholecystitis. She was referred to gen surgery Dr. Gaynelle AduEric Wilson but felt moderate surgical risk by Dr. [name] and recommended defer elective surgery, managing with lifestyle. Non-obstructing left renal stone was seen on 10/2019 US, had left flank pain in July, distal ureteral stone, passed with med support.   She recently saw Dr. [name] (cardiology) who recommended stop cholesterol meds, but to continue ASA. She is on chronic 2L O2 by [place], prescribed breztri by pulm but reports was causing oral thrush and no longer taking. Managing fairly well, reports occasional albuterol use.   She has baseline CKD, recently 3b. Patient and family requesting recheck today;  Lab Results  Component Value Date   GFRNONAA 34 (L) 06/06/2020   GFRNONAA 46 (L) 04/16/2020   GFRNONAA 35 (L) 02/08/2020    CBC Latest Ref Rng & Units 06/06/2020 05/12/2020 04/16/2020  WBC 4.0 - 10.5 K/uL 1.3(L) 1.9(L) 1.8(L)  Hemoglobin 12.0 - 15.0 g/dL 4.0(J9.3(L) 8.9(L) 9.3(L)  Hematocrit 36.0 - 46.0 % 30.2(L) 28.0(L) 28.2(L)  Platelets 150 - 400 K/uL 87(L) 139(L) 128(L)   T2DM well controlled on victoza, insulin glargine 70 units and novolog 50-75 units TID Lab Results  Component Value Date   HGBA1C 6.2 (H) 04/16/2020     Past Medical History:  Diagnosis Date  . Anemia    hx (04/14/2016)  .  Anxiety   . Arthritis    "severe in my back; hands; ankles" (04/14/2016)  . Atypical chest pain 01/24/2018   Midline pain absent supine "constant" daytime since 12/31/17 > resolved as of 02/20/2018 on gerd/ gas diet   . Basal cell carcinoma    "several burned off; one cut off"  . CAD in native artery    a. NSTEMI 04/2016 - s/p DES toLAD and LCx. PCI to LCx notable for microembolization during cath.  . Chest pain- reslved with stopping Brilinta now on Plaix 04/16/2016  . Chronic lower back pain   . CKD (chronic kidney disease), stage III (HCC)    stage 3  . Complication of anesthesia   . COPD (chronic obstructive pulmonary disease) (HCC)   . Depression   . Diverticulosis   . DJD (degenerative joint disease)   . Family history of adverse reaction to anesthesia   . Gallstones   . GERD (gastroesophageal reflux disease)   . History of gout   . History of kidney stones   . Hyperlipidemia   . Hypertension   . IBS (irritable bowel syndrome)   . Ischemic cardiomyopathy    a. 04/2016: EF 40-50% by cath, 50-55% +WMA by echo.  . 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  . Morbid obesity (HCC)   . NSTEMI (non-ST elevated myocardial infarction) (HCC)  04/14/2016  . Obstructive sleep apnea of adult    appt for cpap 8-8 to adjust settings no cpap at this time  . On supplemental oxygen therapy    uses at bedtime or exposed to heat, 2Liters  . Peripheral neuropathy    feet and toes  . Peripheral vascular disease ([place])   . RLS (restless legs syndrome)   . Spinal stenosis   . Type II diabetes mellitus ([place])   . Vitamin D deficiency   . Walking pneumonia   . Wears partial dentures    lower     Allergies  Allergen Reactions  . Brilinta [Ticagrelor] Shortness Of Breath and Other (See Comments)    Chest pain (also)  . Aspirin Other (See Comments)    Can tolerate in small doses (is already on a blood thinner AND has kidney disease)  . Nsaids Other (See Comments)    Patient is  taking a blood thinner and has kidney disease  . Minocycline Hcl Other (See Comments)    Welts  . Oruvail [Ketoprofen] Other (See Comments)    Has kidney disease and is taking a blood thinner  . Tricor [Fenofibrate]     Reaction unknown  . Vasotec [Enalaprilat]     Reaction unknown  . Zinc     Reaction unknown    Current Outpatient Medications on File Prior to Visit  Medication Sig  . acetaminophen (TYLENOL) 325 MG tablet Take 2 tablets (650 mg total) by mouth every 4 (four) hours as needed for headache or mild pain.  . Ascorbic Acid (VITAMIN C) 1000 MG tablet Take 1,000 mg by mouth daily.  Marland Kitchen aspirin EC 81 MG EC tablet Take 1 tablet (81 mg total) by mouth daily.  . BD PEN NEEDLE NANO 2ND GEN 32G X 4 MM MISC USE AS DIRECTED 4 TIMES A DAY  . buPROPion (WELLBUTRIN XL) 300 MG 24 hr tablet TAKE 1 TABLET (300 MG TOTAL) BY MOUTH DAILY FOR MOOD  . Cholecalciferol 125 MCG (5000 UT) capsule Take 5,000 Units by mouth 2 (two) times daily.   . Continuous Blood Gluc Receiver (FREESTYLE LIBRE 14 DAY READER) DEVI 1 Device by Does not apply route every 14 (fourteen) days.  . Continuous Blood Gluc Sensor (FREESTYLE LIBRE 14 DAY SENSOR) MISC Apply 1 application topically every 14 (fourteen) days.  . diclofenac sodium (VOLTAREN) 1 % GEL Apply 4 g topically 4 (four) times daily. (Patient taking differently: Apply 4 g topically 4 (four) times daily as needed (pain).)  . escitalopram (LEXAPRO) 20 MG tablet Take 1 tablet (20 mg total) by mouth daily.  Marland Kitchen albuterol (VENTOLIN HFA) 108 (90 Base) MCG/ACT inhaler Inhale 2 puffs into the lungs every 4 (four) hours as needed for wheezing or shortness of breath. (Patient not taking: Reported on 06/12/2020)  . Budeson-Glycopyrrol-Formoterol (BREZTRI AEROSPHERE) 160-9-4.8 MCG/ACT AERO Inhale 2 puffs into the lungs in the morning and at bedtime. (Patient not taking: Reported on 06/12/2020)  . ferrous sulfate 325 (65 FE) MG tablet Take 325 mg by mouth 2 (two) times daily with a  meal.   . fluconazole (DIFLUCAN) 100 MG tablet Take      1 tablet     Daily       for Oral Thrush (Yeast) Infection  . furosemide (LASIX) 40 MG tablet Take 40 mg by mouth daily as needed for fluid.   Marland Kitchen gabapentin (NEURONTIN) 100 MG capsule TAKE 1 TO 2 CAPSULES 3 TO 4 X /DAY AS NEEDED FOR PAINFUL DIABETIC NEUROPATHY  .  Insulin Glargine (BASAGLAR KWIKPEN) 100 UNIT/ML INJECT 70 UNITS DAILY FOR DIABETES  . ketoconazole (NIZORAL) 2 % cream APPLY TO AFFECTED AREA TWICE A DAY (Patient taking differently: Apply 1 application topically daily as needed (itching).)  . Lancets (ONETOUCH ULTRASOFT) lancets Use as instructed  . liraglutide (VICTOZA) 18 MG/3ML SOPN Inject 1.2 mg into the skin every morning.  . metoprolol succinate (TOPROL-XL) 25 MG 24 hr tablet Take 1 tablet (25 mg total) by mouth at bedtime.  . nitroGLYCERIN (NITROSTAT) 0.4 MG SL tablet Place 1 tablet (0.4 mg total) under the tongue every 5 (five) minutes x 3 doses as needed for chest pain.  Marland Kitchen NOVOLOG FLEXPEN 100 UNIT/ML FlexPen INJECT 50 TO 75 UNITS 3 X /DAY BEFORE MEALS AS DIRECTED FOR DIABETES (Patient taking differently: Inject 20-25 Units into the skin 3 (three) times daily before meals. BS<150 Take 20 units BS>150 Take 25 units)  . ONETOUCH VERIO test strip USE AS INSTRUCTED TO CHECK SUGAR 2 TIMES DAILY.  . Polyethylene Glycol 400 (BLINK TEARS OP) Place 1 drop into both eyes daily as needed (dry eyes).   Marland Kitchen rOPINIRole (REQUIP) 3 MG tablet TAKE 1 TABLET 4 TIMES A DAY FOR RESTLESS LEGS  . triamcinolone (NASACORT) 55 MCG/ACT AERO nasal inhaler Place 2 sprays into the nose at bedtime.  . vitamin B-12 (CYANOCOBALAMIN) 1000 MCG tablet Take 1,000 mcg by mouth daily.  . vitamin E 180 MG (400 UNITS) capsule Take 400 Units by mouth daily.   No current facility-administered medications on file prior to visit.    Allergies:  Allergies  Allergen Reactions  . Brilinta [Ticagrelor] Shortness Of Breath and Other (See Comments)    Chest pain (also)   . Aspirin Other (See Comments)    Can tolerate in small doses (is already on a blood thinner AND has kidney disease)  . Nsaids Other (See Comments)    Patient is taking a blood thinner and has kidney disease  . Minocycline Hcl Other (See Comments)    Welts  . Oruvail [Ketoprofen] Other (See Comments)    Has kidney disease and is taking a blood thinner  . Tricor [Fenofibrate]     Reaction unknown  . Vasotec [Enalaprilat]     Reaction unknown  . Zinc     Reaction unknown   Surgical History:  She  has a past surgical history that includes Total knee arthroplasty (Bilateral); Lumbar disc surgery (X 2); Appendectomy; Melanoma excision (Left); Excision basal cell carcinoma (Left); Shoulder open rotator cuff repair (Right); Shoulder arthroscopy w/ rotator cuff repair (Left); Cataract extraction, bilateral (Bilateral); Cardiac catheterization (N/A, 04/15/2016); Cardiac catheterization (N/A, 04/15/2016); Debridement tennis elbow (Right); Carpal tunnel release (Right); Colonoscopy with propofol (N/A, 12/25/2019); Esophagogastroduodenoscopy (egd) with propofol (N/A, 12/25/2019); biopsy (12/25/2019); and polypectomy (12/25/2019). Family History:  Herfamily history includes AAA (abdominal aortic aneurysm) in her father; Alzheimer's disease in her sister; Cancer in her brother and sister; Heart attack in her mother; Heart disease in her mother and son; Kidney disease in her father; Parkinson's disease in her father. Social History:   reports that she quit smoking about 36 years ago. Her smoking use included cigarettes. She has a 62.50 pack-year smoking history. She has never used smokeless tobacco. She reports that she does not drink alcohol and does not use drugs.   ROS: Review of Systems  Constitutional: Positive for malaise/fatigue. Negative for chills, diaphoresis, fever and weight loss.  HENT: Negative.   Eyes: Negative.   Respiratory: Positive for shortness of breath (chronic, well controlled  with 2L  O2). Negative for cough, sputum production and wheezing.   Cardiovascular: Negative for chest pain, palpitations, claudication, leg swelling and PND.  Gastrointestinal: Positive for abdominal pain and diarrhea. Negative for blood in stool, constipation, heartburn, melena, nausea and vomiting.  Genitourinary: Negative for dysuria, flank pain, frequency, hematuria and urgency.  Skin: Negative for itching and rash.  Neurological: Negative for dizziness, sensory change and headaches.  Endo/Heme/Allergies: Negative for polydipsia. Does not bruise/bleed easily.  Psychiatric/Behavioral: Negative for substance abuse.     Physical Exam:  BP 134/72   Pulse 79   Temp (!) 97.3 F (36.3 C)   Resp 18   Wt 245 lb (111.1 kg)   SpO2 93%   BMI 46.29 kg/m   General Appearance: Well nourished, well dressed elder in no apparent distress. Eyes: PERRLA, EOMs, conjunctiva no swelling or erythema Sinuses: No Frontal/maxillary tenderness ENT/Mouth: Ext aud canals clear, TMs without erythema, bulging. No erythema, swelling, or exudate on post pharynx.  Tonsils not swollen or erythematous. Hearing normal.  Neck: Supple, thyroid normal.  Respiratory: Respiratory effort normal, BS equal bilaterally but diminished, without rales, rhonchi, wheezing or stridor. She is on 2L O2 [place].  Cardio: RRR with no MRGs. Brisk peripheral pulses without edema.  Abdomen: Soft, morbidly obese abdomen,  + BS.  Mild generalized lower abdominal tenderness, neg Murphy's, no guarding, rebound, hernias, palpable masses. Lymphatics: Non tender without lymphadenopathy.  Musculoskeletal: Slow steady gait, no obvious deformity Skin: Warm, dry without rashes, lesions; ecchymosis  Neuro: Cranial nerves intact. Normal muscle tone, no cerebellar symptoms. Sensation intact.  Psych: Awake and oriented X 3, normal affect, Insight and Judgment appropriate.     Izora Ribas, NP 1:18 PM [facility]

## 2020-06-12 ENCOUNTER — Encounter: Payer: Self-pay | Admitting: Adult Health

## 2020-06-12 ENCOUNTER — Ambulatory Visit (INDEPENDENT_AMBULATORY_CARE_PROVIDER_SITE_OTHER): Payer: PPO | Admitting: Adult Health

## 2020-06-12 ENCOUNTER — Other Ambulatory Visit: Payer: Self-pay

## 2020-06-12 VITALS — BP 134/72 | HR 79 | Temp 97.3°F | Resp 18 | Wt 245.0 lb

## 2020-06-12 DIAGNOSIS — K8020 Calculus of gallbladder without cholecystitis without obstruction: Secondary | ICD-10-CM | POA: Diagnosis not present

## 2020-06-12 DIAGNOSIS — N183 Chronic kidney disease, stage 3 unspecified: Secondary | ICD-10-CM | POA: Diagnosis not present

## 2020-06-12 DIAGNOSIS — E1122 Type 2 diabetes mellitus with diabetic chronic kidney disease: Secondary | ICD-10-CM

## 2020-06-12 DIAGNOSIS — Z87442 Personal history of urinary calculi: Secondary | ICD-10-CM

## 2020-06-12 DIAGNOSIS — R1031 Right lower quadrant pain: Secondary | ICD-10-CM

## 2020-06-12 DIAGNOSIS — K2100 Gastro-esophageal reflux disease with esophagitis, without bleeding: Secondary | ICD-10-CM

## 2020-06-12 DIAGNOSIS — Z79899 Other long term (current) drug therapy: Secondary | ICD-10-CM

## 2020-06-12 MED ORDER — PANTOPRAZOLE SODIUM 40 MG PO TBEC: 40.0000 mg | DELAYED_RELEASE_TABLET | Freq: Two times a day (BID) | ORAL | 1 refills | Status: DC | PRN

## 2020-06-12 NOTE — Patient Instructions (Signed)
Try anoro instead of breztri - 1 puff once daily - no steroid so shouldn't cause oral thrush     All plants except nuts, seeds, avocado, coconut, oils are low fat and ok to eat as much as you want  Limit oils, fried foods, dairy and animal fats (butter, ice cream, etc)   Butter, oils  Try grilling, baking, boiling, slow cooking instead     Gallbladder Eating Plan If you have a gallbladder condition, you may have trouble digesting fats. Eating a low-fat diet can help reduce your symptoms, and may be helpful before and after having surgery to remove your gallbladder (cholecystectomy). Your health care provider may recommend that you work with a diet and nutrition specialist (dietitian) to help you reduce the amount of fat in your diet. What are tips for following this plan? General guidelines  Limit your fat intake to less than 30% of your total daily calories. If you eat around 1,800 calories each day, this is less than 60 grams (g) of fat per day.  Fat is an important part of a healthy diet. Eating a low-fat diet can make it hard to maintain a healthy body weight. Ask your dietitian how much fat, calories, and other nutrients you need each day.  Eat small, frequent meals throughout the day instead of three large meals.  Drink at least 8-10 cups of fluid a day. Drink enough fluid to keep your urine clear or pale yellow.  Limit alcohol intake to no more than 1 drink a day for nonpregnant women and 2 drinks a day for men. One drink equals 12 oz of beer, 5 oz of wine, or 1 oz of hard liquor. Reading food labels  Check Nutrition Facts on food labels for the amount of fat per serving. Choose foods with less than 3 grams of fat per serving.   Shopping  Choose nonfat and low-fat healthy foods. Look for the words "nonfat," "low fat," or "fat free."  Avoid buying processed or prepackaged foods. Cooking  Cook using low-fat methods, such as baking, broiling, grilling, or  boiling.  Cook with small amounts of healthy fats, such as olive oil, grapeseed oil, canola oil, or sunflower oil. What foods are recommended?  All fresh, frozen, or canned fruits and vegetables.  Whole grains.  Low-fat or non-fat (skim) milk and yogurt.  Lean meat, skinless poultry, fish, eggs, and beans.  Low-fat protein supplement powders or drinks.  Spices and herbs. What foods are not recommended?  High-fat foods. These include baked goods, fast food, fatty cuts of meat, ice cream, french toast, sweet rolls, pizza, cheese bread, foods covered with butter, creamy sauces, or cheese.  Fried foods. These include french fries, tempura, battered fish, breaded chicken, fried breads, and sweets.  Foods with strong odors.  Foods that cause bloating and gas. Summary  A low-fat diet can be helpful if you have a gallbladder condition, or before and after gallbladder surgery.  Limit your fat intake to less than 30% of your total daily calories. This is about 60 g of fat if you eat 1,800 calories each day.  Eat small, frequent meals throughout the day instead of three large meals. This information is not intended to replace advice given to you by your health care provider. Make sure you discuss any questions you have with your health care provider. Document Revised: 12/13/2019 Document Reviewed: 12/13/2019 Elsevier Patient Education  2021 [place] and Cholesterol Restricted Eating Plan Getting too  much fat and cholesterol in your diet may cause health problems. Choosing the right foods helps keep your fat and cholesterol at normal levels. This can keep you from getting certain diseases. Your doctor may recommend an eating plan that includes:  Total fat: ______% or less of total calories a day.  Saturated fat: ______% or less of total calories a day.  Cholesterol: less than _________mg a day.  Fiber: ______g a day. What are tips for following this plan? Meal  planning  At meals, divide your plate into four equal parts: ? Fill one-half of your plate with vegetables and green salads. ? Fill one-fourth of your plate with whole grains. ? Fill one-fourth of your plate with low-fat (lean) protein foods.  Eat fish that is high in omega-3 fats at least two times a week. This includes mackerel, tuna, sardines, and salmon.  Eat foods that are high in fiber, such as whole grains, beans, apples, broccoli, carrots, peas, and barley. General tips  Work with your doctor to lose weight if you need to.  Avoid: ? Foods with added sugar. ? Fried foods. ? Foods with partially hydrogenated oils.  Limit alcohol intake to no more than 1 drink a day for nonpregnant women and 2 drinks a day for men. One drink equals 12 oz of beer, 5 oz of wine, or 1 oz of hard liquor.   Reading food labels  Check food labels for: ? Trans fats. ? Partially hydrogenated oils. ? Saturated fat (g) in each serving. ? Cholesterol (mg) in each serving. ? Fiber (g) in each serving.  Choose foods with healthy fats, such as: ? Monounsaturated fats. ? Polyunsaturated fats. ? Omega-3 fats.  Choose grain products that have whole grains. Look for the word "whole" as the first word in the ingredient list. Cooking  Cook foods using low-fat methods. These include baking, boiling, grilling, and broiling.  Eat more home-cooked foods. Eat at restaurants and buffets less often.  Avoid cooking using saturated fats, such as butter, cream, palm oil, palm kernel oil, and coconut oil. Recommended foods Fruits  All fresh, canned (in natural juice), or frozen fruits. Vegetables  Fresh or frozen vegetables (raw, steamed, roasted, or grilled). Green salads. Grains  Whole grains, such as whole wheat or whole grain breads, crackers, cereals, and pasta. Unsweetened oatmeal, bulgur, barley, quinoa, or brown rice. Corn or whole wheat flour tortillas. Meats and other protein foods  Ground beef  (85% or leaner), grass-fed beef, or beef trimmed of fat. Skinless chicken or [place]. Ground chicken or [place]. Pork trimmed of fat. All fish and seafood. Egg whites. Dried beans, peas, or lentils. Unsalted nuts or seeds. Unsalted canned beans. Nut butters without added sugar or oil. Dairy  Low-fat or nonfat dairy products, such as skim or 1% milk, 2% or reduced-fat cheeses, low-fat and fat-free ricotta or cottage cheese, or plain low-fat and nonfat yogurt. Fats and oils  Tub margarine without trans fats. Light or reduced-fat mayonnaise and salad dressings. Avocado. Olive, canola, sesame, or safflower oils. The items listed above may not be a complete list of foods and beverages you can eat. Contact a dietitian for more information.   Foods to avoid Fruits  Canned fruit in heavy syrup. Fruit in cream or butter sauce. Fried fruit. Vegetables  Vegetables cooked in cheese, cream, or butter sauce. Fried vegetables. Grains  White bread. White pasta. White rice. Cornbread. Bagels, pastries, and croissants. Crackers and snack foods that contain trans fat and hydrogenated oils. Meats and  other protein foods  Fatty cuts of meat. Ribs, chicken wings, bacon, sausage, bologna, salami, chitterlings, fatback, hot dogs, bratwurst, and packaged lunch meats. Liver and organ meats. Whole eggs and egg yolks. Chicken and [place] with skin. Fried meat. Dairy  Whole or 2% milk, cream, half-and-half, and cream cheese. Whole milk cheeses. Whole-fat or sweetened yogurt. Full-fat cheeses. Nondairy creamers and whipped toppings. Processed cheese, cheese spreads, and cheese curds. Beverages  Alcohol. Sugar-sweetened drinks such as sodas, lemonade, and fruit drinks. Fats and oils  Butter, stick margarine, lard, shortening, ghee, or bacon fat. Coconut, palm kernel, and palm oils. Sweets and desserts  Corn syrup, sugars, honey, and molasses. Candy. Jam and jelly. Syrup. Sweetened cereals. Cookies, pies, cakes,  donuts, muffins, and ice cream. The items listed above may not be a complete list of foods and beverages you should avoid. Contact a dietitian for more information. Summary  Choosing the right foods helps keep your fat and cholesterol at normal levels. This can keep you from getting certain diseases.  At meals, fill one-half of your plate with vegetables and green salads.  Eat high-fiber foods, like whole grains, beans, apples, carrots, peas, and barley.  Limit added sugar, saturated fats, alcohol, and fried foods. This information is not intended to replace advice given to you by your health care provider. Make sure you discuss any questions you have with your health care provider. Document Revised: 08/29/2019 Document Reviewed: 08/29/2019 Elsevier Patient Education  2021 [employer].

## 2020-06-13 DIAGNOSIS — I5032 Chronic diastolic (congestive) heart failure: Secondary | ICD-10-CM | POA: Diagnosis not present

## 2020-06-13 DIAGNOSIS — I2510 Atherosclerotic heart disease of native coronary artery without angina pectoris: Secondary | ICD-10-CM | POA: Diagnosis not present

## 2020-06-13 LAB — COMPLETE METABOLIC PANEL WITH GFR
AG Ratio: 1.8 (calc) (ref 1.0–2.5)
ALT: 10 U/L (ref 6–29)
AST: 12 U/L (ref 10–35)
Albumin: 4.1 g/dL (ref 3.6–5.1)
Alkaline phosphatase (APISO): 73 U/L (ref 37–153)
BUN/Creatinine Ratio: 17 (calc) (ref 6–22)
BUN: 27 mg/dL — ABNORMAL HIGH (ref 7–25)
CO2: 30 mmol/L (ref 20–32)
Calcium: 9.6 mg/dL (ref 8.6–10.4)
Chloride: 103 mmol/L (ref 98–110)
Creat: 1.55 mg/dL — ABNORMAL HIGH (ref 0.60–0.93)
GFR, Est African American: 37 mL/min/{1.73_m2} — ABNORMAL LOW (ref >=60)
GFR, Est Non African American: 32 mL/min/{1.73_m2} — ABNORMAL LOW (ref >=60)
Globulin: 2.3 g/dL (calc) (ref 1.9–3.7)
Glucose, Bld: 96 mg/dL (ref 65–99)
Potassium: 4.9 mmol/L (ref 3.5–5.3)
Sodium: 144 mmol/L (ref 135–146)
Total Bilirubin: 0.4 mg/dL (ref 0.2–1.2)
Total Protein: 6.4 g/dL (ref 6.1–8.1)

## 2020-06-13 LAB — CBC WITH DIFFERENTIAL/PLATELET
Absolute Monocytes: 59 cells/uL — ABNORMAL LOW (ref 200–950)
Basophils Absolute: 0 cells/uL (ref 0–200)
Basophils Relative: 0 %
Eosinophils Absolute: 90 cells/uL (ref 15–500)
Eosinophils Relative: 5.6 %
HCT: 31.1 % — ABNORMAL LOW (ref 35.0–45.0)
Hemoglobin: 10.1 g/dL — ABNORMAL LOW (ref 11.7–15.5)
Lymphs Abs: 642 cells/uL — ABNORMAL LOW (ref 850–3900)
MCH: 33.8 pg — ABNORMAL HIGH (ref 27.0–33.0)
MCHC: 32.5 g/dL (ref 32.0–36.0)
MCV: 104 fL — ABNORMAL HIGH (ref 80.0–100.0)
MPV: 10.6 fL (ref 7.5–12.5)
Monocytes Relative: 3.7 %
Neutro Abs: 810 cells/uL — ABNORMAL LOW (ref 1500–7800)
Neutrophils Relative %: 50.6 %
Platelets: 97 10*3/uL — ABNORMAL LOW (ref 140–400)
RBC: 2.99 10*6/uL — ABNORMAL LOW (ref 3.80–5.10)
RDW: 14.9 % (ref 11.0–15.0)
Total Lymphocyte: 40.1 %
WBC: 1.6 10*3/uL — ABNORMAL LOW (ref 3.8–10.8)

## 2020-06-16 ENCOUNTER — Other Ambulatory Visit: Payer: Self-pay | Admitting: Adult Health

## 2020-06-16 ENCOUNTER — Encounter: Payer: Self-pay | Admitting: Adult Health

## 2020-06-16 DIAGNOSIS — U071 COVID-19: Secondary | ICD-10-CM | POA: Insufficient documentation

## 2020-06-16 DIAGNOSIS — C9200 Acute myeloblastic leukemia, not having achieved remission: Secondary | ICD-10-CM | POA: Insufficient documentation

## 2020-06-17 ENCOUNTER — Other Ambulatory Visit: Payer: Self-pay | Admitting: Family

## 2020-06-17 ENCOUNTER — Telehealth: Payer: Self-pay | Admitting: Family

## 2020-06-17 DIAGNOSIS — U071 COVID-19: Secondary | ICD-10-CM

## 2020-06-17 NOTE — Telephone Encounter (Signed)
I connected by phone with Janet Choi on 06/17/2020 at 11:02 AM to discuss the potential use of a new treatment for mild to moderate COVID-19 viral infection in non-hospitalized patients.  This patient is a 80 y.o. female that meets the FDA criteria for Emergency Use Authorization of COVID monoclonal antibody sotrovimab.  Has a (+) direct SARS-CoV-2 viral test result  Has mild or moderate COVID-19   Is NOT hospitalized due to COVID-19  Is within 10 days of symptom onset  Has at least one of the high risk factor(s) for progression to severe COVID-19 and/or hospitalization as defined in EUA.  Specific high risk criteria : Older age (>/= 80 yo), BMI > 25, Immunosuppressive Disease or Treatment and Cardiovascular disease or hypertension  I have spoken and communicated the following to the patient or parent/caregiver regarding COVID monoclonal antibody treatment:  1. FDA has authorized the emergency use for the treatment of mild to moderate COVID-19 in adults and pediatric patients with positive results of direct SARS-CoV-2 viral testing who are 7 years of age and older weighing at least 40 kg, and who are at high risk for progressing to severe COVID-19 and/or hospitalization.  2. The significant known and potential risks and benefits of COVID monoclonal antibody, and the extent to which such potential risks and benefits are unknown.  3. Information on available alternative treatments and the risks and benefits of those alternatives, including clinical trials.  4. Patients treated with COVID monoclonal antibody should continue to self-isolate and use infection control measures (e.g., wear mask, isolate, social distance, avoid sharing personal items, clean and disinfect "high touch" surfaces, and frequent handwashing) according to CDC guidelines.   5. The patient or parent/caregiver has the option to accept or refuse COVID monoclonal antibody treatment.  After reviewing this information with  the patient, the patient has agreed to receive one of the available covid 19 monoclonal antibodies and will be provided an appropriate fact sheet prior to infusion.   Loel Dubonnet, NP 06/17/2020 11:02 AM

## 2020-06-17 NOTE — Telephone Encounter (Signed)
Called to discuss with patient about COVID-19 symptoms and the use of one of the available treatments for those with mild to moderate Covid symptoms and at a high risk of hospitalization.  Pt appears to qualify for outpatient treatment due to co-morbid conditions and/or a member of an at-risk group in accordance with the FDA Emergency Use Authorization.    Symptom onset: 06/14/20 Vaccinated: Yes Booster? 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 Sent MyChart message. With her present AML diagnosis and incomplete vaccine series for an immunocompromised individual, would be good candidate for MAB.    Janet Choi

## 2020-06-17 NOTE — Progress Notes (Signed)
I connected by phone with Janet Choi on 06/17/2020 at 11:01 AM to discuss the potential use of a new treatment for mild to moderate COVID-19 viral infection in non-hospitalized patients.  This patient is a 80 y.o. female that meets the FDA criteria for Emergency Use Authorization of COVID monoclonal antibody sotrovimab.  Has a (+) direct SARS-CoV-2 viral test result  Has mild or moderate COVID-19   Is NOT hospitalized due to COVID-19  Is within 10 days of symptom onset  Has at least one of the high risk factor(s) for progression to severe COVID-19 and/or hospitalization as defined in EUA.  Specific high risk criteria : Older age (>/= 80 yo), BMI > 25, Immunosuppressive Disease or Treatment and Chronic Lung Disease   I have spoken and communicated the following to the patient or parent/caregiver regarding COVID monoclonal antibody treatment:  1. FDA has authorized the emergency use for the treatment of mild to moderate COVID-19 in adults and pediatric patients with positive results of direct SARS-CoV-2 viral testing who are 47 years of age and older weighing at least 40 kg, and who are at high risk for progressing to severe COVID-19 and/or hospitalization.  2. The significant known and potential risks and benefits of COVID monoclonal antibody, and the extent to which such potential risks and benefits are unknown.  3. Information on available alternative treatments and the risks and benefits of those alternatives, including clinical trials.  4. Patients treated with COVID monoclonal antibody should continue to self-isolate and use infection control measures (e.g., wear mask, isolate, social distance, avoid sharing personal items, clean and disinfect "high touch" surfaces, and frequent handwashing) according to CDC guidelines.   5. The patient or parent/caregiver has the option to accept or refuse COVID monoclonal antibody treatment.  After reviewing this information with the patient, the  patient has agreed to receive one of the available covid 19 monoclonal antibodies and will be provided an appropriate fact sheet prior to infusion. Loel Dubonnet, NP 06/17/2020 11:01 AM

## 2020-06-18 ENCOUNTER — Ambulatory Visit (HOSPITAL_COMMUNITY)
Admission: RE | Admit: 2020-06-18 | Discharge: 2020-06-18 | Disposition: A | Discharge status: Home / Self Care | Payer: PPO | Source: Ambulatory Visit | Attending: Pulmonary Disease | Admitting: Pulmonary Disease

## 2020-06-18 DIAGNOSIS — D849 Immunodeficiency, unspecified: Secondary | ICD-10-CM | POA: Diagnosis not present

## 2020-06-18 DIAGNOSIS — J989 Respiratory disorder, unspecified: Secondary | ICD-10-CM | POA: Insufficient documentation

## 2020-06-18 DIAGNOSIS — Z6825 Body mass index (BMI) 25.0-25.9, adult: Secondary | ICD-10-CM | POA: Insufficient documentation

## 2020-06-18 DIAGNOSIS — U071 COVID-19: Secondary | ICD-10-CM | POA: Diagnosis not present

## 2020-06-18 MED ORDER — SODIUM CHLORIDE 0.9 % IV SOLN: INTRAVENOUS | Status: DC | PRN

## 2020-06-18 MED ORDER — ALBUTEROL SULFATE HFA 108 (90 BASE) MCG/ACT IN AERS: 2.0000 | INHALATION_SPRAY | Freq: Once | RESPIRATORY_TRACT | Status: DC | PRN

## 2020-06-18 MED ORDER — ACETAMINOPHEN 325 MG PO TABS
650.0000 mg | ORAL_TABLET | Freq: Four times a day (QID) | ORAL | Status: DC | PRN
  Administered 2020-06-18: 650 mg via ORAL
  Filled 2020-06-18: qty 2

## 2020-06-18 MED ORDER — FAMOTIDINE IN NACL 20-0.9 MG/50ML-% IV SOLN: 20.0000 mg | Freq: Once | INTRAVENOUS | Status: DC | PRN

## 2020-06-18 MED ORDER — EPINEPHRINE 0.3 MG/0.3ML IJ SOAJ: 0.3000 mg | Freq: Once | INTRAMUSCULAR | Status: DC | PRN

## 2020-06-18 MED ORDER — SOTROVIMAB 500 MG/8ML IV SOLN
500.0000 mg | Freq: Once | INTRAVENOUS | Status: AC
  Administered 2020-06-18: 500 mg via INTRAVENOUS

## 2020-06-18 MED ORDER — METHYLPREDNISOLONE SODIUM SUCC 125 MG IJ SOLR: 125.0000 mg | Freq: Once | INTRAMUSCULAR | Status: DC | PRN

## 2020-06-18 MED ORDER — DIPHENHYDRAMINE HCL 50 MG/ML IJ SOLN: 50.0000 mg | Freq: Once | INTRAMUSCULAR | Status: DC | PRN

## 2020-06-18 NOTE — Progress Notes (Signed)
Patient reviewed Fact Sheet for Patients, Parents, and Caregivers for Emergency Use Authorization (EUA) of sotrovimab for the Treatment of Coronavirus. Patient also reviewed and is agreeable to the estimated cost of treatment. Patient is agreeable to proceed.   

## 2020-06-18 NOTE — Discharge Instructions (Signed)

## 2020-07-03 DIAGNOSIS — I5032 Chronic diastolic (congestive) heart failure: Secondary | ICD-10-CM | POA: Diagnosis not present

## 2020-07-03 DIAGNOSIS — I2510 Atherosclerotic heart disease of native coronary artery without angina pectoris: Secondary | ICD-10-CM | POA: Diagnosis not present

## 2020-07-03 DIAGNOSIS — G4733 Obstructive sleep apnea (adult) (pediatric): Secondary | ICD-10-CM | POA: Diagnosis not present

## 2020-07-09 DIAGNOSIS — I2510 Atherosclerotic heart disease of native coronary artery without angina pectoris: Secondary | ICD-10-CM | POA: Diagnosis not present

## 2020-07-09 DIAGNOSIS — G4733 Obstructive sleep apnea (adult) (pediatric): Secondary | ICD-10-CM | POA: Diagnosis not present

## 2020-07-09 DIAGNOSIS — I5032 Chronic diastolic (congestive) heart failure: Secondary | ICD-10-CM | POA: Diagnosis not present

## 2020-07-11 DIAGNOSIS — I2510 Atherosclerotic heart disease of native coronary artery without angina pectoris: Secondary | ICD-10-CM | POA: Diagnosis not present

## 2020-07-11 DIAGNOSIS — I5032 Chronic diastolic (congestive) heart failure: Secondary | ICD-10-CM | POA: Diagnosis not present

## 2020-07-22 ENCOUNTER — Other Ambulatory Visit: Payer: Self-pay

## 2020-07-22 MED ORDER — VICTOZA 18 MG/3ML ~~LOC~~ SOPN: 1.2000 mg | PEN_INJECTOR | Freq: Every morning | SUBCUTANEOUS | 3 refills | Status: DC

## 2020-07-23 ENCOUNTER — Other Ambulatory Visit: Payer: Self-pay | Admitting: Cardiovascular Disease

## 2020-07-23 ENCOUNTER — Ambulatory Visit (INDEPENDENT_AMBULATORY_CARE_PROVIDER_SITE_OTHER): Payer: PPO | Admitting: Adult Health Nurse Practitioner

## 2020-07-23 ENCOUNTER — Encounter: Payer: Self-pay | Admitting: Adult Health Nurse Practitioner

## 2020-07-23 ENCOUNTER — Other Ambulatory Visit: Payer: Self-pay

## 2020-07-23 VITALS — BP 132/74 | HR 81 | Temp 97.6°F | Ht 61.0 in | Wt 259.0 lb

## 2020-07-23 DIAGNOSIS — E559 Vitamin D deficiency, unspecified: Secondary | ICD-10-CM

## 2020-07-23 DIAGNOSIS — G4733 Obstructive sleep apnea (adult) (pediatric): Secondary | ICD-10-CM

## 2020-07-23 DIAGNOSIS — Z79899 Other long term (current) drug therapy: Secondary | ICD-10-CM | POA: Diagnosis not present

## 2020-07-23 DIAGNOSIS — I5033 Acute on chronic diastolic (congestive) heart failure: Secondary | ICD-10-CM

## 2020-07-23 DIAGNOSIS — D509 Iron deficiency anemia, unspecified: Secondary | ICD-10-CM | POA: Diagnosis not present

## 2020-07-23 DIAGNOSIS — E1169 Type 2 diabetes mellitus with other specified complication: Secondary | ICD-10-CM

## 2020-07-23 DIAGNOSIS — Z91199 Patient's noncompliance with other medical treatment and regimen due to unspecified reason: Secondary | ICD-10-CM

## 2020-07-23 DIAGNOSIS — N1831 Chronic kidney disease, stage 3a: Secondary | ICD-10-CM

## 2020-07-23 DIAGNOSIS — E1122 Type 2 diabetes mellitus with diabetic chronic kidney disease: Secondary | ICD-10-CM

## 2020-07-23 DIAGNOSIS — R300 Dysuria: Secondary | ICD-10-CM | POA: Diagnosis not present

## 2020-07-23 DIAGNOSIS — C9200 Acute myeloblastic leukemia, not having achieved remission: Secondary | ICD-10-CM

## 2020-07-23 DIAGNOSIS — N183 Chronic kidney disease, stage 3 unspecified: Secondary | ICD-10-CM

## 2020-07-23 DIAGNOSIS — E785 Hyperlipidemia, unspecified: Secondary | ICD-10-CM | POA: Diagnosis not present

## 2020-07-23 DIAGNOSIS — I10 Essential (primary) hypertension: Secondary | ICD-10-CM | POA: Diagnosis not present

## 2020-07-23 DIAGNOSIS — B370 Candidal stomatitis: Secondary | ICD-10-CM

## 2020-07-23 DIAGNOSIS — Z9119 Patient's noncompliance with other medical treatment and regimen: Secondary | ICD-10-CM

## 2020-07-23 DIAGNOSIS — E538 Deficiency of other specified B group vitamins: Secondary | ICD-10-CM

## 2020-07-23 DIAGNOSIS — J9621 Acute and chronic respiratory failure with hypoxia: Secondary | ICD-10-CM

## 2020-07-23 DIAGNOSIS — J449 Chronic obstructive pulmonary disease, unspecified: Secondary | ICD-10-CM

## 2020-07-23 DIAGNOSIS — D631 Anemia in chronic kidney disease: Secondary | ICD-10-CM

## 2020-07-23 DIAGNOSIS — Z0001 Encounter for general adult medical examination with abnormal findings: Secondary | ICD-10-CM

## 2020-07-23 DIAGNOSIS — R1031 Right lower quadrant pain: Secondary | ICD-10-CM

## 2020-07-23 MED ORDER — NYSTATIN 100000 UNIT/ML MT SUSP: OROMUCOSAL | 1 refills | Status: DC

## 2020-07-23 NOTE — Patient Instructions (Addendum)
   Start by building on the current medication schedule.  Make this meals times.  Small frequent meals.  First write down all of the foods you are eating.  This will help you to see which foods you should replace.  Think of it as replacing not taking away.  Come up with a list of foods that are healthy.  Keep these foods readily avilable for you to fix as well as someone else in the home for you.  I would suggesting focusing on improving one meal at a time.  This way it becomes a natural part of your day.   We will respond via MyChart with your lab results.  Likely we will give you instructions to further reduce your swelling.

## 2020-07-23 NOTE — Progress Notes (Signed)
Complete Physical   Assessment and Plan:  Peony was seen today for annual exam.  Diagnoses and all orders for this visit:  Encounter for routine adult medical exam with abnormal findings Yearly  CKD stage 3 due to type 2 diabetes mellitus (HCC) Increase fluids  Avoid NSAIDS Blood pressure control Monitor sugars  Will continue to monitor  Essential hypertension Continue current medications Monitor blood pressure at home; call if consistently over 130/80 Continue DASH diet.   Reminder to go to the ER if any CP, SOB, nausea, dizziness, severe HA, changes vision/speech, left arm numbness and tingling and jaw pain. -     CBC with Differential/Platelet -     COMPLETE METABOLIC PANEL WITH GFR -     TSH  Iron deficiency anemia, unspecified iron deficiency anemia type -     Iron,Total/Total Iron Binding Cap  Hyperlipidemia associated with type 2 diabetes mellitus (HCC) Continue medications: Discussed dietary and exercise modifications Low fat diet -     Lipid panel  Type 2 diabetes mellitus with hyperlipidemia (HCC) Continue medications: Continue Victoza, Novolog 20units with breakfast and 25 units with dinner, Basaglar 70units at bedtime.  Discussed scheduling this since patient does not go to sleep at same time. Discussed general issues about diabetes pathophysiology and management. Education: Reviewed 'ABCs' of diabetes management (respective goals in parentheses):  A1C (<7), blood pressure (<130/80), and cholesterol (LDL <70) Dietary recommendations Encouraged aerobic exercise.  Discussed foot care, check daily Yearly retinal exam Dental exam every 6 months Monitor blood glucose, discussed goal for patient -     Hemoglobin A1c  Acute on chronic diastolic CHF (congestive heart failure) (HCC) Cholesterol Continue medications: Continue low cholesterol diet and exercise.  Check lipid panel.   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  Dysuria -     Urinalysis w microscopic + reflex cultur  B12 deficiency -     Vitamin B12  Vitamin D deficiency Continue supplementation to maintain goal of 70-100 Taking Vitamin D 5,000 IU daily -     VITAMIN D 25 Hydroxy (Vit-D Deficiency, Fractures)  Oral candidiasis -     nystatin (MYCOSTATIN) 100000 UNIT/ML suspension; 5 ml four times a day, retain in mouth as long as possible (Swish and Swallow).  Use for 48 hours after symptoms resolve.  Polypharmacy Discussed medications at length with patient today  OSA and COPD overlap syndrome (HCC) Continue CPAP/BiPAP, using nightly for at least 8 hours  Helping with daytime fatigue Weight loss still advised Discussed mask & tubing hygeine  Anemia due to stage 3a chronic kidney disease (HCC) -Iron panel  Severe obesity (BMI 35.0-39.9) with comorbidity ([place]) Discussed dietary and exercise modifications  Noncompliance with diabetes treatment Discussed goals with patient Improve eating schedule Discussed eating on routine schedule to improve nutritional intake & medication compliance Provided sheet to log food, pick one meal, start replacing with healthier options Decrease carbohydrates and sugars  Medication management Continued  Other orders -     Urine Culture -     REFLEXIVE URINE CULTURE  Acute on chronic respiratory failure with hypoxia (HCC) Continuous 02, 2L [place], continue with benefit. -     CBC with Differential/Platelet  Discussed importance of lifestyle change in control of the patient to improve her over all health and keep her from hospital admission  Including daily weights, management of edema, compression stockings, fluid intake and  nutritional improvements.  All medications were reviewed with patient and family and fully reconciled. All questions answered fully, and patient and family members were encouraged to call  the office with any further questions or concerns. Discussed goal to avoid readmission related to this diagnosis.  There are no discontinued medications.  Over 50 minutes of face to face interview, exam, counseling, chart review, and complex, high/moderate level critical decision making was performed this visit.   Future Appointments  Date Time Provider [place]  10/23/2020  2:30 PM Liane Comber, NP [facility] None  07/23/2021  2:00 PM Garnet Sierras, NP [facility] None     HPI 80 y.o.female presents for follow up for complete physical.  She is accompanied today with her Comer Locket and her son who lives with her.  He has been helping her with some of her medications.  They are concerned about the patient and her blood glucose management.  She reports that she is having a lot of swelling in her bilaterally lower extremities.  She is not taking the lasix because then she has to go to the bathroom frequently.  She had this available for use PRN.  Discussed medication, not taking daily related to CKD.  Will check labs today may need consecutive days of treatment to help with edema.  Also dicussed use of compression stockings daily.  Patient does not intake adequate fluids and discussed this at length.  She is has a white coating over her tough and in her mouth, likely thrush.  She uses inhalers daily and reports she is rinsing her mouth after use.  Likely from uncontrolled glucose. She is having hyperglycemia and her blood sugars are running over 200 every morning.  She reports it will be anywhere from 200-400.  She is checking her blood glucose at least three times a day or more.   She reports she is eating too many carbohydrates, she is not hungry during the day .  She is not eating on any type of schedule and does not consistently eat.  Discussed importance of this with T2DM.  She admits she has not been eating well and knows she should do better.  She is up 14lbs since last OV on  06/12/20.  She is seeing Dr Lamonte Sakai, Pulmonology.  She is on continuous O2 via Nasal Cannula.  She is not using daily inhaler Discussed this at length with patient. She has tried Librarian, academic in the past and reports sore in the mouth as a side effect despite rinsing mouth after use. She has been playing cards again and she has started up with crafts again which she enjoys.    Reports that her friend [name] does help her with her work around the house as she tires out quickly.  From previous OV: Admit date to the hospital was 12/25/19, patient was discharged from the hospital on 12/29/19 and our clinical staff contacted the office the day after discharge to set up a follow up appointment. The discharge summary, medications, and diagnostic test results were reviewed before meeting with the patient. The patient was admitted for: Acute on chronic respiratory failure  She was have colonoscopy and endoscopy for pancytopenia at [facility] when she became hypoxic and placed on BiPAP.  chest x-ray was done that shows pleural effusions cardiomegaly and bilateral interstitial infiltrates, she had a CT scan on 12/06/2019 that showed no effusions and no pulmonary edema. She has follow up schedule with Cardiology Dr. Oval Linsey. Last Echo 01/07/20 EF 60-65%. Hematology/oncology, Dr [name]  on 02/08/20  Urology, Dr Roni Bread, another kidney stone was found on CT, asymptomatic.  Pulmonology Dr Lamonte Sakai on 01/03/20.  Plan to repeat CT on 02/2020.  Continue Breztri and 2L 02  Since hospital admission she continues to feel fatigued.  She is wearing 2L portable 02.  Today she reports she is doing much better since hospitalization.   She has history of DMII.  She reports her last bloog glucose was 155.     Lab Results  Component Value Date   SWNIOEVO35 0,093 (H) 07/23/2020      She had NSTEMI with PCI LAD and LCx 04/2016, on plavix follows Cardio, Dr. Oval Linsey, on ASA.  BMI is There is no height or weight on file to calculate BMI.,  she is working on diet and exercise. Filed Weights   07/23/20 1359  Weight: 259 lb (117.5 kg)    Wt Readings from Last 3 Encounters:  07/03/18 234 lb 3.2 oz (106.2 kg)  06/29/18 239 lb 9.6 oz (108.7 kg)  04/19/18 243 lb 6.4 oz (110.4 kg)    She is on cholesterol medication, lipitor and zetia denies myalgias. Her cholesterol is at goal. The cholesterol last visit was:    Lab Results  Component Value Date   CHOL 234 (H) 07/23/2020   HDL 37 (L) 07/23/2020   LDLCALC 136 (H) 07/23/2020   TRIG 397 (H) 07/23/2020   CHOLHDL 6.3 (H) 07/23/2020    She has been working on diet and exercise for diabetes CKD stage 3 on ACE/ARB Polyneuropathy on gabapentin Hyperlipidemia on lipitor and zetia.  CAD s/p PCI LAB and LCx 04/2016, she is on bASA/plavix, follows Dr. Oval Linsey she is on basaglar 70 units at night,   novolog 16-20 units with meals She is not checking her sugars still.  She will take her morning insulin but will forget to take her dinner time sugar, she does not eat lunch and does not take any insulin at lunch.  She has one touch ultra.  She is following Dr. Darnell Level,  denies hypoglycemia , polydipsia, polyuria and visual disturbances.  Last A1C in the office was:   Lab Results  Component Value Date   HGBA1C 7.9 (H) 07/23/2020    Lab Results  Component Value Date   HGBA1C 10.9 (H) 06/29/2018   Lab Results  Component Value Date   CREATININE 1.35 (H) 06/29/2018   BUN 29 (H) 06/29/2018   NA 139 06/29/2018   K 4.4 06/29/2018   CL 100 06/29/2018   CO2 24 06/29/2018   Last GFR, she does not see nephrology.  Lab Results  Component Value Date   GFRNONAA 38 (L) 06/29/2018    Patient is on Vitamin D3 supplement for defciency.  Last result was not to goal.  Lab Results  Component Value Date   VD25OH 39 07/23/2020     Names of Other Physician/Practitioners you currently use: 1. [facility] here for primary care 2. Eye Exam: Due for  2022 3. Dentist Due for 2022  Patient Care Team: [name], MD as PCP - General (Internal Medicine) Skeet Latch, MD as PCP - Cardiology (Cardiology) Marygrace Drought, MD as Consulting Physician (Ophthalmology) Larey Dresser, MD as Consulting Physician (Cardiology) Nobie Putnam, MD as Consulting Physician (Oncology) Tania Ade, MD as Consulting Physician (Orthopedic Surgery) Loletha Carrow Kirke Corin, MD as Consulting Physician (Gastroenterology)    Screening Tests: Immunization History  Administered Date(s) Administered  . DT (Pediatric) 04/14/2015  . Fluad Quad(high Dose  65+) 05/23/2020  . Hepatitis B 07/09/1999, 08/09/1999, 01/09/2000  . Influenza, High Dose Seasonal PF 03/20/2014, 02/14/2017, 04/12/2018, 02/22/2019  . Influenza,inj,quad, With Preservative 04/26/2016  . Influenza-Unspecified 03/28/2015, 04/14/2015, 02/14/2017  . PFIZER(Purple Top)SARS-COV-2 Vaccination 08/14/2019, 09/04/2019  . Pneumococcal Conjugate-13 03/20/2014  . Pneumococcal Polysaccharide-23 10/15/2009  . Td 03/19/2004   Preventative care: Last colonoscopy: 12/2019 Last mammogram: 2012 OVERDUE BUT DECLINES Last pap smear/pelvic exam: remote DEXA:2012 [place] AB 2007 Echo 2017 EF 35-00%, diastolic grade 1 dysfunction. Cath 04/2016 EF 40-50% PFT 01/2018  EYE exam Jan 2020 [place] [facility]  Prior vaccinations: TD or Tdap: 2016         Influenza: 2018 Pneumococcal: 2011  Prevnar 13 2015 Shingles/Zostavax: declines due to cost  Medications  Continue Victoza, Novolog 20units with breakfast and 25 units with dinner, Basaglar 70units at bedtime.   Medications Ordered in Other Visits (Cardiovascular):  .  atorvastatin (LIPITOR) tablet 80 mg .  furosemide (LASIX) injection 40 mg .  metoprolol succinate (TOPROL-XL) 24 hr tablet 25 mg   .  albuterol (PROVENTIL) (2.5 MG/3ML) 0.083% nebulizer solution 2.5 mg .  benzonatate (TESSALON) capsule 100 mg .  fluticasone furoate-vilanterol (BREO ELLIPTA)  200-25 MCG/INH 1 puff .  ipratropium-albuterol (DUONEB) 0.5-2.5 (3) MG/3ML nebulizer solution 3 mL  .  aspirin EC tablet 81 mg    .  ferrous sulfate tablet 325 mg .  buPROPion (WELLBUTRIN XL) 24 hr tablet 300 mg . .  escitalopram (LEXAPRO) tablet 20 mg .  gabapentin (NEURONTIN) capsule 100 mg .  pantoprazole (PROTONIX) EC tablet 40 mg .  rOPINIRole (REQUIP) tablet 3 mg .  tiZANidine (ZANAFLEX) tablet 4 mg No current facility-administered medications for this visit. No current outpatient medications on file.  Past Medical History:  Diagnosis Date  . Anemia    hx (04/14/2016)  . Anxiety   . Arthritis    "severe in my back; hands; ankles" (04/14/2016)  . Atypical chest pain 01/24/2018   Midline pain absent supine "constant" daytime since 12/31/17 > resolved as of 02/20/2018 on gerd/ gas diet   . Basal cell carcinoma    "several burned off; one cut off"  . CAD in native artery    a. NSTEMI 04/2016 - s/p DES toLAD and LCx. PCI to LCx notable for microembolization during cath.  . Chest pain- reslved with stopping Brilinta now on Plaix 04/16/2016  . Chronic lower back pain   . CKD (chronic kidney disease), stage III (HCC)    stage 3  . Complication of anesthesia   . COPD (chronic obstructive pulmonary disease) ([place])   . Depression   . Diverticulosis   . DJD (degenerative joint disease)   . Family history of adverse reaction to anesthesia   . Gallstones   . GERD (gastroesophageal reflux disease)   . History of gout   . History of kidney stones   . Hyperlipidemia   . Hypertension   . IBS (irritable bowel syndrome)   . Ischemic cardiomyopathy    a. 04/2016: EF 40-50% by cath, 50-55% +WMA by echo.  . 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 Morbid obesity ([place])   . NSTEMI (non-ST elevated myocardial infarction) ([place]) 04/14/2016  . Obstructive sleep apnea of adult    appt for cpap 8-8 to adjust settings no cpap at this time  . On supplemental oxygen therapy     uses at bedtime or exposed to heat, 2Liters  . Peripheral neuropathy    feet and toes  .  Peripheral vascular disease ([place])   . RLS (restless legs syndrome)   . Spinal stenosis   . Type II diabetes mellitus ([place])   . Vitamin D deficiency   . Walking pneumonia   . Wears partial dentures    lower     Allergies  Allergen Reactions  . Brilinta [Ticagrelor] Shortness Of Breath and Other (See Comments)    Chest pain (also)  . Aspirin Other (See Comments)    Can tolerate in small doses (is already on a blood thinner AND has kidney disease)  . Nsaids Other (See Comments)    Patient is taking a blood thinner and has kidney disease  . Minocycline Hcl Other (See Comments)    Welts  . Oruvail [Ketoprofen] Other (See Comments)    Has kidney disease and is taking a blood thinner  . Tricor [Fenofibrate]     Reaction unknown  . Vasotec [Enalaprilat]     Reaction unknown  . Zinc     Reaction unknown    ROS: all negative except above.   Physical Exam: Filed Weights   07/23/20 1359  Weight: 259 lb (117.5 kg)   BP 132/74   Pulse 81   Temp 97.6 F (36.4 C)   Ht 5\' 1"  (1.549 m)   Wt 259 lb (117.5 kg)   SpO2 96%   BMI 48.94 kg/m  General Appearance: Well nourished, in no apparent distress. Eyes: PERRLA, EOMs, conjunctiva no swelling or erythema Sinuses: No Frontal/maxillary tenderness ENT/Mouth: Ext aud canals clear, TMs without erythema, bulging. No erythema, swelling, or exudate on post pharynx.  Tonsils not swollen or erythematous. Hearing normal.  Neck: Supple, thyroid normal.  Respiratory: Nasal cannula O2, 2L.  Respiratory effort normal, BS equal bilaterally without rales, rhonchi, wheezing or stridor.  Cardio: RRR with no MRGs. Brisk peripheral pulses, +1 edema bilaterally. Abdomen: Soft, + BS.  Non tender, no guarding, rebound, hernias, masses. Lymphatics: Non tender without lymphadenopathy.  Musculoskeletal: Full ROM, 5/5 strength, normal gait.  Skin: Warm, dry without  rashes, lesions, ecchymosis.  Neuro: Cranial nerves intact. Normal muscle tone, no cerebellar symptoms. Sensation intact.  Psych: Awake and oriented X 3, normal affect, Insight and Judgment appropriate.      Garnet Sierras, Laqueta Jean, DNP [facility] 07/27/2020  2:58 PM

## 2020-07-25 ENCOUNTER — Other Ambulatory Visit: Payer: Self-pay | Admitting: Adult Health Nurse Practitioner

## 2020-07-25 ENCOUNTER — Other Ambulatory Visit: Payer: Self-pay | Admitting: Internal Medicine

## 2020-07-25 DIAGNOSIS — L03115 Cellulitis of right lower limb: Secondary | ICD-10-CM

## 2020-07-25 DIAGNOSIS — N3000 Acute cystitis without hematuria: Secondary | ICD-10-CM

## 2020-07-25 LAB — URINE CULTURE
MICRO NUMBER:: 11656237
SPECIMEN QUALITY:: ADEQUATE

## 2020-07-25 LAB — CBC WITH DIFFERENTIAL/PLATELET
Absolute Monocytes: 69 cells/uL — ABNORMAL LOW (ref 200–950)
Basophils Absolute: 10 cells/uL (ref 0–200)
Basophils Relative: 0.6 %
Eosinophils Absolute: 69 cells/uL (ref 15–500)
Eosinophils Relative: 4.3 %
HCT: 25.9 % — ABNORMAL LOW (ref 35.0–45.0)
Hemoglobin: 8.5 g/dL — ABNORMAL LOW (ref 11.7–15.5)
Lymphs Abs: 715 cells/uL — ABNORMAL LOW (ref 850–3900)
MCH: 33.9 pg — ABNORMAL HIGH (ref 27.0–33.0)
MCHC: 32.8 g/dL (ref 32.0–36.0)
MCV: 103.2 fL — ABNORMAL HIGH (ref 80.0–100.0)
MPV: 10.5 fL (ref 7.5–12.5)
Monocytes Relative: 4.3 %
Neutro Abs: 738 cells/uL — ABNORMAL LOW (ref 1500–7800)
Neutrophils Relative %: 46.1 %
Platelets: 110 10*3/uL — ABNORMAL LOW (ref 140–400)
RBC: 2.51 10*6/uL — ABNORMAL LOW (ref 3.80–5.10)
RDW: 14.4 % (ref 11.0–15.0)
Total Lymphocyte: 44.7 %
WBC: 1.6 10*3/uL — ABNORMAL LOW (ref 3.8–10.8)

## 2020-07-25 LAB — URINALYSIS W MICROSCOPIC + REFLEX CULTURE
Bilirubin Urine: NEGATIVE
Glucose, UA: NEGATIVE
Hyaline Cast: NONE SEEN /LPF
Ketones, ur: NEGATIVE
Nitrites, Initial: POSITIVE — AB
RBC / HPF: NONE SEEN /HPF (ref 0–2)
Specific Gravity, Urine: 1.019 (ref 1.001–1.03)
pH: <=5 (ref 5.0–8.0)

## 2020-07-25 LAB — COMPLETE METABOLIC PANEL WITH GFR
AG Ratio: 2 (calc) (ref 1.0–2.5)
ALT: 12 U/L (ref 6–29)
AST: 18 U/L (ref 10–35)
Albumin: 4.1 g/dL (ref 3.6–5.1)
Alkaline phosphatase (APISO): 66 U/L (ref 37–153)
BUN/Creatinine Ratio: 22 (calc) (ref 6–22)
BUN: 28 mg/dL — ABNORMAL HIGH (ref 7–25)
CO2: 30 mmol/L (ref 20–32)
Calcium: 9.4 mg/dL (ref 8.6–10.4)
Chloride: 106 mmol/L (ref 98–110)
Creat: 1.29 mg/dL — ABNORMAL HIGH (ref 0.60–0.93)
GFR, Est African American: 46 mL/min/{1.73_m2} — ABNORMAL LOW (ref >=60)
GFR, Est Non African American: 39 mL/min/{1.73_m2} — ABNORMAL LOW (ref >=60)
Globulin: 2.1 g/dL (calc) (ref 1.9–3.7)
Glucose, Bld: 102 mg/dL — ABNORMAL HIGH (ref 65–99)
Potassium: 4.6 mmol/L (ref 3.5–5.3)
Sodium: 143 mmol/L (ref 135–146)
Total Bilirubin: 0.4 mg/dL (ref 0.2–1.2)
Total Protein: 6.2 g/dL (ref 6.1–8.1)

## 2020-07-25 LAB — LIPID PANEL
Cholesterol: 234 mg/dL — ABNORMAL HIGH (ref <200)
HDL: 37 mg/dL — ABNORMAL LOW (ref >=50)
LDL Cholesterol (Calc): 136 mg/dL (calc) — ABNORMAL HIGH
Non-HDL Cholesterol (Calc): 197 mg/dL (calc) — ABNORMAL HIGH (ref <130)
Total CHOL/HDL Ratio: 6.3 (calc) — ABNORMAL HIGH (ref <5.0)
Triglycerides: 397 mg/dL — ABNORMAL HIGH (ref <150)

## 2020-07-25 LAB — IRON, TOTAL/TOTAL IRON BINDING CAP
%SAT: 12 % (calc) — ABNORMAL LOW (ref 16–45)
Iron: 52 ug/dL (ref 45–160)
TIBC: 435 mcg/dL (calc) (ref 250–450)

## 2020-07-25 LAB — TSH: TSH: 1.06 mIU/L (ref 0.40–4.50)

## 2020-07-25 LAB — VITAMIN B12: Vitamin B-12: 1292 pg/mL — ABNORMAL HIGH (ref 200–1100)

## 2020-07-25 LAB — CULTURE INDICATED

## 2020-07-25 LAB — HEMOGLOBIN A1C
Hgb A1c MFr Bld: 7.9 % of total Hgb — ABNORMAL HIGH (ref <5.7)
Mean Plasma Glucose: 180 mg/dL
eAG (mmol/L): 10 mmol/L

## 2020-07-25 LAB — VITAMIN D 25 HYDROXY (VIT D DEFICIENCY, FRACTURES): Vit D, 25-Hydroxy: 39 ng/mL (ref 30–100)

## 2020-07-25 MED ORDER — NITROFURANTOIN MONOHYD MACRO 100 MG PO CAPS: 100.0000 mg | ORAL_CAPSULE | Freq: Two times a day (BID) | ORAL | 0 refills | Status: DC

## 2020-07-25 NOTE — Progress Notes (Signed)
Blood count has dropped from last check.  Kidney function: continue to increase water intake  Cholesterol:  Significant increase from last check.  A1c increase, as we discussed and suspected from 6.2 to 7.9  Thyroid is in normal range.  B12: elevated can skip two days of this.  Vitamin D is very low.  Double dose of vitamin D IF this was something you were taking regularly.  Urine sample:  urinary tract infection. Will send in antibiotic to pharmacy for you.  For your swelling:  Take furosemide (lasix) daily for 5 days.  Increase water intake to help with urinary tract infection.  Sincerely,           Garnet Sierras, NP

## 2020-07-26 ENCOUNTER — Inpatient Hospital Stay (HOSPITAL_COMMUNITY)
Admission: EM | Admit: 2020-07-26 | Discharge: 2020-07-31 | DRG: 291 | Disposition: A | Discharge status: Hospice | Payer: PPO | Attending: Internal Medicine | Admitting: Internal Medicine

## 2020-07-26 ENCOUNTER — Telehealth: Payer: Self-pay | Admitting: Pulmonary Disease

## 2020-07-26 ENCOUNTER — Other Ambulatory Visit: Payer: Self-pay

## 2020-07-26 ENCOUNTER — Encounter (HOSPITAL_COMMUNITY): Payer: Self-pay

## 2020-07-26 ENCOUNTER — Emergency Department (HOSPITAL_COMMUNITY): Payer: PPO

## 2020-07-26 DIAGNOSIS — I130 Hypertensive heart and chronic kidney disease with heart failure and stage 1 through stage 4 chronic kidney disease, or unspecified chronic kidney disease: Secondary | ICD-10-CM | POA: Diagnosis present

## 2020-07-26 DIAGNOSIS — J209 Acute bronchitis, unspecified: Secondary | ICD-10-CM | POA: Diagnosis not present

## 2020-07-26 DIAGNOSIS — Z7982 Long term (current) use of aspirin: Secondary | ICD-10-CM

## 2020-07-26 DIAGNOSIS — E1151 Type 2 diabetes mellitus with diabetic peripheral angiopathy without gangrene: Secondary | ICD-10-CM | POA: Diagnosis present

## 2020-07-26 DIAGNOSIS — K219 Gastro-esophageal reflux disease without esophagitis: Secondary | ICD-10-CM | POA: Diagnosis present

## 2020-07-26 DIAGNOSIS — E1165 Type 2 diabetes mellitus with hyperglycemia: Secondary | ICD-10-CM | POA: Diagnosis present

## 2020-07-26 DIAGNOSIS — I110 Hypertensive heart disease with heart failure: Secondary | ICD-10-CM | POA: Diagnosis not present

## 2020-07-26 DIAGNOSIS — D61818 Other pancytopenia: Secondary | ICD-10-CM | POA: Diagnosis present

## 2020-07-26 DIAGNOSIS — J9601 Acute respiratory failure with hypoxia: Secondary | ICD-10-CM | POA: Diagnosis not present

## 2020-07-26 DIAGNOSIS — J9621 Acute and chronic respiratory failure with hypoxia: Secondary | ICD-10-CM | POA: Diagnosis present

## 2020-07-26 DIAGNOSIS — Z7189 Other specified counseling: Secondary | ICD-10-CM | POA: Diagnosis not present

## 2020-07-26 DIAGNOSIS — J441 Chronic obstructive pulmonary disease with (acute) exacerbation: Secondary | ICD-10-CM | POA: Diagnosis present

## 2020-07-26 DIAGNOSIS — N390 Urinary tract infection, site not specified: Secondary | ICD-10-CM | POA: Diagnosis present

## 2020-07-26 DIAGNOSIS — N179 Acute kidney failure, unspecified: Secondary | ICD-10-CM | POA: Diagnosis present

## 2020-07-26 DIAGNOSIS — R4702 Dysphasia: Secondary | ICD-10-CM | POA: Diagnosis not present

## 2020-07-26 DIAGNOSIS — I213 ST elevation (STEMI) myocardial infarction of unspecified site: Secondary | ICD-10-CM | POA: Diagnosis not present

## 2020-07-26 DIAGNOSIS — E785 Hyperlipidemia, unspecified: Secondary | ICD-10-CM | POA: Diagnosis present

## 2020-07-26 DIAGNOSIS — Z809 Family history of malignant neoplasm, unspecified: Secondary | ICD-10-CM

## 2020-07-26 DIAGNOSIS — R0902 Hypoxemia: Secondary | ICD-10-CM | POA: Diagnosis not present

## 2020-07-26 DIAGNOSIS — Z85820 Personal history of malignant melanoma of skin: Secondary | ICD-10-CM | POA: Diagnosis not present

## 2020-07-26 DIAGNOSIS — Z9981 Dependence on supplemental oxygen: Secondary | ICD-10-CM | POA: Diagnosis not present

## 2020-07-26 DIAGNOSIS — Z794 Long term (current) use of insulin: Secondary | ICD-10-CM

## 2020-07-26 DIAGNOSIS — Z96653 Presence of artificial knee joint, bilateral: Secondary | ICD-10-CM | POA: Diagnosis present

## 2020-07-26 DIAGNOSIS — Z79899 Other long term (current) drug therapy: Secondary | ICD-10-CM

## 2020-07-26 DIAGNOSIS — I252 Old myocardial infarction: Secondary | ICD-10-CM

## 2020-07-26 DIAGNOSIS — Z841 Family history of disorders of kidney and ureter: Secondary | ICD-10-CM

## 2020-07-26 DIAGNOSIS — Z8249 Family history of ischemic heart disease and other diseases of the circulatory system: Secondary | ICD-10-CM

## 2020-07-26 DIAGNOSIS — I5033 Acute on chronic diastolic (congestive) heart failure: Secondary | ICD-10-CM | POA: Diagnosis present

## 2020-07-26 DIAGNOSIS — Z6841 Body Mass Index (BMI) 40.0 and over, adult: Secondary | ICD-10-CM | POA: Diagnosis not present

## 2020-07-26 DIAGNOSIS — J9811 Atelectasis: Secondary | ICD-10-CM | POA: Diagnosis not present

## 2020-07-26 DIAGNOSIS — G2581 Restless legs syndrome: Secondary | ICD-10-CM | POA: Diagnosis present

## 2020-07-26 DIAGNOSIS — G4733 Obstructive sleep apnea (adult) (pediatric): Secondary | ICD-10-CM | POA: Diagnosis present

## 2020-07-26 DIAGNOSIS — J440 Chronic obstructive pulmonary disease with acute lower respiratory infection: Secondary | ICD-10-CM | POA: Diagnosis not present

## 2020-07-26 DIAGNOSIS — D638 Anemia in other chronic diseases classified elsewhere: Secondary | ICD-10-CM | POA: Diagnosis not present

## 2020-07-26 DIAGNOSIS — M5430 Sciatica, unspecified side: Secondary | ICD-10-CM | POA: Diagnosis present

## 2020-07-26 DIAGNOSIS — C9200 Acute myeloblastic leukemia, not having achieved remission: Secondary | ICD-10-CM | POA: Diagnosis not present

## 2020-07-26 DIAGNOSIS — K449 Diaphragmatic hernia without obstruction or gangrene: Secondary | ICD-10-CM | POA: Diagnosis not present

## 2020-07-26 DIAGNOSIS — J9620 Acute and chronic respiratory failure, unspecified whether with hypoxia or hypercapnia: Secondary | ICD-10-CM | POA: Diagnosis present

## 2020-07-26 DIAGNOSIS — D649 Anemia, unspecified: Secondary | ICD-10-CM | POA: Diagnosis not present

## 2020-07-26 DIAGNOSIS — Z820 Family history of epilepsy and other diseases of the nervous system: Secondary | ICD-10-CM

## 2020-07-26 DIAGNOSIS — Z515 Encounter for palliative care: Secondary | ICD-10-CM

## 2020-07-26 DIAGNOSIS — IMO0002 Reserved for concepts with insufficient information to code with codable children: Secondary | ICD-10-CM

## 2020-07-26 DIAGNOSIS — Z66 Do not resuscitate: Secondary | ICD-10-CM | POA: Diagnosis present

## 2020-07-26 DIAGNOSIS — Z20822 Contact with and (suspected) exposure to covid-19: Secondary | ICD-10-CM | POA: Diagnosis present

## 2020-07-26 DIAGNOSIS — F419 Anxiety disorder, unspecified: Secondary | ICD-10-CM | POA: Diagnosis present

## 2020-07-26 DIAGNOSIS — B9620 Unspecified Escherichia coli [E. coli] as the cause of diseases classified elsewhere: Secondary | ICD-10-CM | POA: Diagnosis present

## 2020-07-26 DIAGNOSIS — I2510 Atherosclerotic heart disease of native coronary artery without angina pectoris: Secondary | ICD-10-CM | POA: Diagnosis present

## 2020-07-26 DIAGNOSIS — N1832 Chronic kidney disease, stage 3b: Secondary | ICD-10-CM | POA: Diagnosis present

## 2020-07-26 DIAGNOSIS — R0602 Shortness of breath: Secondary | ICD-10-CM | POA: Diagnosis not present

## 2020-07-26 DIAGNOSIS — Z955 Presence of coronary angioplasty implant and graft: Secondary | ICD-10-CM

## 2020-07-26 DIAGNOSIS — Z886 Allergy status to analgesic agent status: Secondary | ICD-10-CM

## 2020-07-26 DIAGNOSIS — E119 Type 2 diabetes mellitus without complications: Secondary | ICD-10-CM

## 2020-07-26 DIAGNOSIS — J811 Chronic pulmonary edema: Secondary | ICD-10-CM | POA: Diagnosis not present

## 2020-07-26 DIAGNOSIS — Z888 Allergy status to other drugs, medicaments and biological substances status: Secondary | ICD-10-CM

## 2020-07-26 DIAGNOSIS — R059 Cough, unspecified: Secondary | ICD-10-CM | POA: Diagnosis not present

## 2020-07-26 DIAGNOSIS — M109 Gout, unspecified: Secondary | ICD-10-CM | POA: Diagnosis present

## 2020-07-26 DIAGNOSIS — I255 Ischemic cardiomyopathy: Secondary | ICD-10-CM | POA: Diagnosis present

## 2020-07-26 DIAGNOSIS — Z7951 Long term (current) use of inhaled steroids: Secondary | ICD-10-CM

## 2020-07-26 DIAGNOSIS — F32A Depression, unspecified: Secondary | ICD-10-CM | POA: Diagnosis present

## 2020-07-26 DIAGNOSIS — I5032 Chronic diastolic (congestive) heart failure: Secondary | ICD-10-CM | POA: Diagnosis not present

## 2020-07-26 DIAGNOSIS — J90 Pleural effusion, not elsewhere classified: Secondary | ICD-10-CM | POA: Diagnosis not present

## 2020-07-26 MED ORDER — FUROSEMIDE 10 MG/ML IJ SOLN
20.0000 mg | Freq: Once | INTRAMUSCULAR | Status: AC
  Administered 2020-07-26: 20 mg via INTRAVENOUS
  Filled 2020-07-26: qty 4

## 2020-07-26 MED ORDER — IPRATROPIUM-ALBUTEROL 0.5-2.5 (3) MG/3ML IN SOLN
3.0000 mL | Freq: Once | RESPIRATORY_TRACT | Status: AC
  Administered 2020-07-26: 3 mL via RESPIRATORY_TRACT
  Filled 2020-07-26: qty 3

## 2020-07-26 NOTE — Telephone Encounter (Signed)
Called by [name] d/t an episode of increased SOB and decreased O 2 sats precipitated by ambulation to the bathroom. She has a PMH which includes COPD, OSA and diastolic heart failure.  Usually on 2 L/min [place] to maintain sat = 96%. Patient transiently had to go up to 3 L/min [place]. She took several Albuterol MDI treatments. She is now finally getting back to what she feels is her baseline. She is able to speak in full sentences. She denies fever, chills, sweats, chest pain or change in amount or character of her sputum. As she appears to be improving, I advised her to call the PCCM office on Monday morning for a f/u appointment with Dr. Lamonte Sakai. She her breathing deteriorate, I advised her to go the Emergency Department for further evaluation and treatment.

## 2020-07-26 NOTE — ED Provider Notes (Signed)
[place] DEPT Provider Note   CSN: [id number] Arrival date & time: 07/26/20  2218     History Chief Complaint  Patient presents with  . Shortness of Breath    Janet Choi is a 80 y.o. female with a history of leukemia, chronic diastolic heart failure, chronic respiratory failure on 2 L home O2 (maintains at 96%), COPD, OSA, anemia, CAD, diabetes mellitus, CKD stage III who presents the emergency department by EMS from home with a chief complaint of shortness of breath.  The patient reports that she has had constant shortness of breath that has been gradually worsening over the last few days.  However, tonight her symptoms significantly worsened.  She increased her home 2 L of O2 to 3.5 L without improvement in her symptoms.  She has also been using her home albuterol, but has not been using her other home prescribed inhalers due to recurrent thrush.  EMS reports on arrival that patient had oxygen saturation of 85% on her home oxygen on arrival.  She was placed on a nonrebreather and oxygen saturations improved to 100%.  She was able to be weaned down to her home 2 L [place] upon arrival in the ER.  She reports that she has had a 15 pound weight gain over the last 2 months reports associated bilateral lower extremity swelling.  She reports associated nausea, intermittently productive cough, intermittent wheezing, and chest tightness.  She has noticed that her abdomen has been more swollen, but states that is decreased in the last 2 days.  She denies vomiting, diarrhea, fever, chills, back pain, rash, syncope, dizziness, lightheadedness, headache.  She was seen by primary care earlier this week and was diagnosed with a UTI and started on Macrobid.  She has had 1 dose of the medication.  Patient had COVID-19 in March 2022.  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  She was advised by oncology that she is not a candidate for treatment options and is not  currently undergoing treatment.  She is DNR/DNI.  The history is provided by the patient and medical records. No language interpreter was used.       Past Medical History:  Diagnosis Date  . Anemia    hx (04/14/2016)  . Anxiety   . Arthritis    "severe in my back; hands; ankles" (04/14/2016)  . Atypical chest pain 01/24/2018   Midline pain absent supine "constant" daytime since 12/31/17 > resolved as of 02/20/2018 on gerd/ gas diet   . Basal cell carcinoma    "several burned off; one cut off"  . CAD in native artery    a. NSTEMI 04/2016 - s/p DES toLAD and LCx. PCI to LCx notable for microembolization during cath.  . Chest pain- reslved with stopping Brilinta now on Plaix 04/16/2016  . Chronic lower back pain   . CKD (chronic kidney disease), stage III (HCC)    stage 3  . Complication of anesthesia   . COPD (chronic obstructive pulmonary disease) ([place])   . Depression   . Diverticulosis   . DJD (degenerative joint disease)   . Family history of adverse reaction to anesthesia   . Gallstones   . GERD (gastroesophageal reflux disease)   . History of gout   . History of kidney stones   . Hyperlipidemia   . Hypertension   . IBS (irritable bowel syndrome)   . Ischemic cardiomyopathy    a. 04/2016: EF 40-50% by cath, 50-55% +WMA by echo.  Marland Kitchen  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 ([place]) ~ 2015  . Morbid obesity ([place])   . NSTEMI (non-ST elevated myocardial infarction) ([place]) 04/14/2016  . Obstructive sleep apnea of adult    appt for cpap 8-8 to adjust settings no cpap at this time  . On supplemental oxygen therapy    uses at bedtime or exposed to heat, 2Liters  . Peripheral neuropathy    feet and toes  . Peripheral vascular disease ([place])   . RLS (restless legs syndrome)   . Spinal stenosis   . Type II diabetes mellitus ([place])   . Vitamin D deficiency   . Walking pneumonia   . Wears partial dentures    lower    Patient Active Problem List   Diagnosis Date Noted  . Acute  on chronic respiratory failure ([place]) 07/27/2020  . 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 COVID-19 06/14/2020) 06/16/2020  . History of renal calculi 06/12/2020  . E. coli UTI 12/29/2019  . Acute renal failure ([place])   . Acute lower UTI 12/28/2019  . AKI (acute kidney injury) ([place])   . Obesity, Class III, BMI 40-49.9 (morbid obesity) ([place])   . Hypotension due to hypovolemia   . Acute pulmonary edema ([place]) 12/25/2019  . Acute on chronic respiratory failure with hypoxia ([place]) 12/25/2019  . Acute diastolic heart failure ([place]) 12/25/2019  . Diverticulosis of colon without hemorrhage   . Polyp of ascending colon   . Polyp of sigmoid colon   . Tortuous colon   . Gastritis and gastroduodenitis   . Hiatal hernia   . Anemia 11/08/2019  . Thrombocytopenia ([place]) 11/08/2019  . Neutropenia ([place]) 11/08/2019  . Chronic diarrhea 10/25/2019  . Pancreatic insufficiency 10/15/2019  . Pancytopenia ([place]) 10/15/2019  . Long term (current) use of insulin ([place]) 09/07/2019  . Solitary pulmonary nodule 08/29/2019  . Chronic bronchitis ([place]) 08/29/2019  . 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 ([place]) 08/07/2019  . Insulin dependent type 2 diabetes mellitus ([place]) 08/07/2019  . Acute on chronic diastolic CHF (congestive heart failure) ([place]) 07/30/2019  . 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 DJD (degenerative joint disease) of knee 09/11/2018  . Diabetic polyneuropathy associated with type 2 diabetes mellitus ([place]) 06/27/2018  . Upper airway cough syndrome 01/24/2018  . DJD (degenerative joint disease), lumbosacral 06/02/2017  . CAD S/P percutaneous coronary angioplasty   . History of non-ST elevation myocardial infarction (NSTEMI) 04/14/2016  . CKD stage 3 due to type 2 diabetes mellitus ([place]) 04/14/2016  . Iron deficiency anemia 08/15/2015  . OSA and COPD overlap syndrome ([place]) 04/16/2015  . RLS (restless legs syndrome) 03/20/2014  . Type 2 diabetes mellitus with  hyperlipidemia ([place]) 03/20/2014  . Medication management 09/12/2013  . Morbid obesity due to excess calories ([place]) 05/14/2013  . Vitamin D deficiency 05/14/2013  . Hyperlipidemia associated with type 2 diabetes mellitus ([place]) 02/24/2009  . Essential hypertension 02/24/2009  . Diverticulosis of large intestine 02/24/2009  . History of colonic polyps 02/24/2009    Past Surgical History:  Procedure Laterality Date  . APPENDECTOMY    . BASAL CELL CARCINOMA EXCISION Left    leg  . BIOPSY  12/25/2019   Procedure: BIOPSY;  Surgeon: Lavena Bullion, DO;  Location: [facility];  Service: Gastroenterology;;  EGD and COLON  . CARDIAC CATHETERIZATION N/A 04/15/2016   Procedure: Left Heart Cath and Coronary Angiography;  Surgeon: Belva Crome, MD;  Location: [facility];  Service: Cardiovascular;  Laterality:  N/A;  . CARDIAC CATHETERIZATION N/A 04/15/2016   Procedure: Coronary Stent Intervention;  Surgeon: Belva Crome, MD;  Location: [facility];  Service: Cardiovascular;  Laterality: N/A;  Mid LAD Mid CFX  . CARPAL TUNNEL RELEASE Right    with trigger finger release  . CATARACT EXTRACTION, BILATERAL Bilateral   . COLONOSCOPY WITH PROPOFOL N/A 12/25/2019   Procedure: COLONOSCOPY WITH PROPOFOL;  Surgeon: Lavena Bullion, DO;  Location: [facility];  Service: Gastroenterology;  Laterality: N/A;  . DEBRIDEMENT TENNIS ELBOW Right   . ESOPHAGOGASTRODUODENOSCOPY (EGD) WITH PROPOFOL N/A 12/25/2019   Procedure: ESOPHAGOGASTRODUODENOSCOPY (EGD) WITH PROPOFOL;  Surgeon: Lavena Bullion, DO;  Location: [facility];  Service: Gastroenterology;  Laterality: N/A;  . LUMBAR DISC SURGERY  X 2  . MELANOMA EXCISION Left    "towards the back of my neck"  . POLYPECTOMY  12/25/2019   Procedure: POLYPECTOMY;  Surgeon: Lavena Bullion, DO;  Location: [facility];  Service: Gastroenterology;;  . SHOULDER ARTHROSCOPY W/ ROTATOR CUFF REPAIR Left   . SHOULDER OPEN ROTATOR CUFF REPAIR Right   .  TOTAL KNEE ARTHROPLASTY Bilateral      OB History   No obstetric history on file.     Family History  Problem Relation Age of Onset  . Heart disease Mother   . Heart attack Mother   . Kidney disease Father   . AAA (abdominal aortic aneurysm) Father   . Parkinson's disease Father   . Cancer Brother        type unknown  . Heart disease Son   . Cancer Sister        female   . Alzheimer's disease Sister   . Colon cancer Neg Hx   . Colon polyps Neg Hx   . Esophageal cancer Neg Hx   . Pancreatic cancer Neg Hx   . Stomach cancer Neg Hx   . Liver disease Neg Hx   . Diabetes Neg Hx     Social History   Tobacco Use  . Smoking status: Former Smoker    Packs/day: 2.50    Years: 25.00    Pack years: 62.50    Types: Cigarettes    Quit date: 11/11/1983    Years since quitting: 36.7  . Smokeless tobacco: Never Used  Vaping Use  . Vaping Use: Never used  Substance Use Topics  . Alcohol use: No    Alcohol/week: 0.0 standard drinks  . Drug use: No    Home Medications Prior to Admission medications   Medication Sig Start Date End Date Taking? Authorizing Provider  Continuous Blood Gluc Receiver (FREESTYLE LIBRE 14 DAY READER) DEVI 1 Device by Does not apply route every 14 (fourteen) days. 09/07/19  Yes Vicie Mutters R, PA-C  Continuous Blood Gluc Sensor (FREESTYLE LIBRE 14 DAY SENSOR) MISC Apply 1 application topically every 14 (fourteen) days. 09/07/19  Yes Vladimir Crofts, PA-C  acetaminophen (TYLENOL) 325 MG tablet Take 2 tablets (650 mg total) by mouth every 4 (four) hours as needed for headache or mild pain. 04/20/16   Isaiah Serge, NP  albuterol (VENTOLIN HFA) 108 (90 Base) MCG/ACT inhaler Inhale 2 puffs into the lungs every 4 (four) hours as needed for wheezing or shortness of breath. Patient not taking: No sig reported 09/07/19   Vladimir Crofts, PA-C  Ascorbic Acid (VITAMIN C) 1000 MG tablet Take 1,000 mg by mouth daily.    [provider]  aspirin EC 81 MG  EC tablet Take 1 tablet (  81 mg total) by mouth daily. 04/21/16   Isaiah Serge, NP  atorvastatin (LIPITOR) 80 MG tablet Take 1 tablet by mouth every evening. 07/21/20   [provider]  BD PEN NEEDLE NANO 2ND GEN 32G X 4 MM MISC USE AS DIRECTED 4 TIMES A DAY 06/06/20   [name], MD  Budeson-Glycopyrrol-Formoterol (BREZTRI AEROSPHERE) 160-9-4.8 MCG/ACT AERO Inhale 2 puffs into the lungs in the morning and at bedtime. Patient not taking: No sig reported 10/15/19   Collene Gobble, MD  buPROPion (WELLBUTRIN XL) 300 MG 24 hr tablet TAKE 1 TABLET (300 MG TOTAL) BY MOUTH DAILY FOR MOOD Patient not taking: Reported on 07/27/2020 03/25/20   Liane Comber, NP  Cholecalciferol 125 MCG (5000 UT) capsule Take 5,000 Units by mouth 2 (two) times daily.     [provider]  diclofenac sodium (VOLTAREN) 1 % GEL Apply 4 g topically 4 (four) times daily. Patient taking differently: Apply 4 g topically 4 (four) times daily as needed (pain). 01/18/18   Vladimir Crofts, PA-C  escitalopram (LEXAPRO) 20 MG tablet Take 1 tablet (20 mg total) by mouth daily. 01/08/20 01/07/21  Garnet Sierras, NP  ferrous sulfate 325 (65 FE) MG tablet Take 325 mg by mouth 2 (two) times daily with a meal.     [provider]  fluconazole (DIFLUCAN) 100 MG tablet Take      1 tablet     Daily       for Oral Thrush (Yeast) Infection Patient not taking: Reported on 07/27/2020 05/05/20   [name], MD  furosemide (LASIX) 40 MG tablet Take 40 mg by mouth daily as needed for fluid.     [provider]  gabapentin (NEURONTIN) 100 MG capsule TAKE 1 TO 2 CAPSULES 3 TO 4 X /DAY AS NEEDED FOR PAINFUL DIABETIC NEUROPATHY 05/26/20   [name], MD  Insulin Glargine [facility]) 100 UNIT/ML INJECT 70 UNITS DAILY FOR DIABETES 04/29/20   Liane Comber, NP  ketoconazole (NIZORAL) 2 % cream APPLY TO AFFECTED AREA TWICE A DAY Patient taking differently: Apply 1 application topically daily as needed  (itching). 11/16/19   [name], MD  Lancets Glory [place] ULTRASOFT) lancets Use as instructed 04/27/16   Philemon Kingdom, MD  liraglutide (VICTOZA) 18 MG/3ML SOPN Inject 1.2 mg into the skin every morning. 07/22/20   Garnet Sierras, NP  metoprolol succinate (TOPROL-XL) 25 MG 24 hr tablet Take 1 tablet (25 mg total) by mouth at bedtime. 07/25/19   [facility], Tami Lin, PA  nitrofurantoin, macrocrystal-monohydrate, (MACROBID) 100 MG capsule Take 1 capsule (100 mg total) by mouth 2 (two) times daily. 07/25/20   Garnet Sierras, NP  nitroGLYCERIN (NITROSTAT) 0.4 MG SL tablet Place 1 tablet (0.4 mg total) under the tongue every 5 (five) minutes x 3 doses as needed for chest pain. 10/04/17   [name], MD  NOVOLOG FLEXPEN 100 UNIT/ML FlexPen INJECT 50 TO 75 UNITS 3 X /DAY BEFORE MEALS AS DIRECTED FOR DIABETES Patient not taking: Reported on 07/27/2020 11/16/19   [name], MD  nystatin (MYCOSTATIN) 100000 UNIT/ML suspension 5 ml four times a day, retain in mouth as long as possible (Swish and Swallow).  Use for 48 hours after symptoms resolve. 07/23/20   Garnet Sierras, NP  ONETOUCH VERIO test strip USE AS INSTRUCTED TO CHECK SUGAR 2 TIMES DAILY. 09/21/17   Philemon Kingdom, MD  pantoprazole (PROTONIX) 40 MG tablet Take 1 tablet (40 mg total) by mouth 2 (two) times daily as needed. For acid  reflux. 06/12/20 06/12/21  Liane Comber, NP  Polyethylene Glycol 400 (BLINK TEARS OP) Place 1 drop into both eyes daily as needed (dry eyes).     [provider]  rOPINIRole (REQUIP) 3 MG tablet TAKE 1 TABLET 4 TIMES A DAY FOR RESTLESS LEGS 03/25/20   Liane Comber, NP  tiZANidine (ZANAFLEX) 4 MG tablet Take 4 mg by mouth at bedtime. 07/25/20   [provider]  triamcinolone (NASACORT) 55 MCG/ACT AERO nasal inhaler Place 2 sprays into the nose at bedtime. 04/16/20 04/16/21  Garnet Sierras, NP  vitamin B-12 (CYANOCOBALAMIN) 1000 MCG tablet Take 1,000 mcg by mouth daily.    [provider]  vitamin E 180 MG (400 UNITS) capsule Take 400 Units by mouth daily.    [provider]    Allergies    Brilinta [ticagrelor], Aspirin, Nsaids, Minocycline hcl, Oruvail [ketoprofen], Tricor [fenofibrate], Vasotec [enalaprilat], and Zinc  Review of Systems   Review of Systems  Constitutional: Positive for fatigue. Negative for activity change, chills and fever.  HENT: Negative for congestion, sinus pressure, sinus pain and sore throat.   Eyes: Negative for visual disturbance.  Respiratory: Positive for cough, chest tightness, shortness of breath and wheezing. Negative for choking.   Cardiovascular: Positive for leg swelling. Negative for chest pain and palpitations.  Gastrointestinal: Positive for abdominal distention. Negative for abdominal pain, constipation, diarrhea, nausea and vomiting.  Genitourinary: Negative for dysuria and urgency.  Musculoskeletal: Negative for back pain, myalgias, neck pain and neck stiffness.  Skin: Negative for rash.  Allergic/Immunologic: Negative for immunocompromised state.  Neurological: Positive for weakness. Negative for dizziness, seizures, syncope, numbness and headaches.  Psychiatric/Behavioral: Negative for confusion.    Physical Exam Updated Vital Signs BP (!) 149/58   Pulse 76   Temp 98.8 F (37.1 C) (Oral)   Resp (!) 22   Ht 5\' 1"  (1.549 m)   Wt 117.5 kg   SpO2 98%   BMI 48.95 kg/m   Physical Exam Vitals and nursing note reviewed.  Constitutional:      General: She is not in acute distress.    Appearance: She is not ill-appearing, toxic-appearing or diaphoretic.     Comments: On nasal cannula. Chronically ill-appearing  HENT:     Head: Normocephalic.     Mouth/Throat:     Mouth: Mucous membranes are moist.     Pharynx: No oropharyngeal exudate or posterior oropharyngeal erythema.     Comments: No thrush Eyes:     Conjunctiva/sclera: Conjunctivae normal.  Cardiovascular:     Rate and Rhythm: Normal  rate and regular rhythm.     Heart sounds: No murmur heard. No friction rub. No gallop.   Pulmonary:     Effort: Pulmonary effort is normal. No respiratory distress.     Comments: Wet sounding cough.  However, lung sounds are clear to auscultation bilaterally with diminished lung sounds throughout.  No overt rhonchi, rales, or wheezes.  She has conversational dyspnea. Abdominal:     General: There is no distension.     Palpations: Abdomen is soft. There is no mass.     Tenderness: There is no abdominal tenderness. There is no right CVA tenderness, left CVA tenderness, guarding or rebound.     Hernia: No hernia is present.  Musculoskeletal:     Cervical back: Neck supple.     Right lower leg: Edema present.     Left lower leg: Edema present.     Comments: 2+ pitting edema in the bilateral lower  extremities.  Skin:    General: Skin is warm.     Coloration: Skin is not jaundiced.     Findings: No rash.  Neurological:     Mental Status: She is alert.  Psychiatric:        Behavior: Behavior normal.     ED Results / Procedures / Treatments   Labs (all labs ordered are listed, but only abnormal results are displayed) Labs Reviewed  CBC WITH DIFFERENTIAL/PLATELET - Abnormal; Notable for the following components:      Result Value   WBC 1.5 (*)    RBC 2.09 (*)    Hemoglobin 7.1 (*)    HCT 23.3 (*)    MCV 111.5 (*)    RDW 16.2 (*)    Platelets 96 (*)    Neutro Abs 1.0 (*)    Lymphs Abs 0.4 (*)    Monocytes Absolute 0.0 (*)    All other components within normal limits  BRAIN NATRIURETIC PEPTIDE - Abnormal; Notable for the following components:   B Natriuretic Peptide 237.9 (*)    All other components within normal limits  COMPREHENSIVE METABOLIC PANEL - Abnormal; Notable for the following components:   Glucose, Bld 292 (*)    BUN 28 (*)    Creatinine, Ser 1.21 (*)    Calcium 8.7 (*)    Total Protein 6.3 (*)    GFR, Estimated 46 (*)    All other components within normal  limits  TROPONIN I (HIGH SENSITIVITY) - Abnormal; Notable for the following components:   Troponin I (High Sensitivity) 34 (*)    All other components within normal limits  TROPONIN I (HIGH SENSITIVITY) - Abnormal; Notable for the following components:   Troponin I (High Sensitivity) 37 (*)    All other components within normal limits  RESP PANEL BY RT-PCR (FLU A&B, COVID) ARPGX2  MAGNESIUM  PHOSPHORUS  BASIC METABOLIC PANEL  CBC WITH DIFFERENTIAL/PLATELET    EKG None  Radiology DG Chest 2 View  Result Date: 07/26/2020 CLINICAL DATA:  Shortness of breath. EXAM: CHEST - 2 VIEW COMPARISON:  Chest x-ray 12/26/2019, CT chest 02/18/2020 FINDINGS: The heart size and mediastinal contours are unchanged. Prominence of the hilar vasculature. Aortic arch calcifications. No focal consolidation. No pulmonary edema. Trace bilateral pleural effusions. No pneumothorax. No acute osseous abnormality. IMPRESSION: Pulmonary edema with bilateral trace pleural effusions. Electronically Signed   By: Iven Finn M.D.   On: 07/26/2020 23:21   CT Angio Chest PE W and/or Wo Contrast  Result Date: 07/27/2020 CLINICAL DATA:  Shortness of breath for 1 week with productive cough, newly diagnosed leukemia EXAM: CT ANGIOGRAPHY CHEST WITH CONTRAST TECHNIQUE: Multidetector CT imaging of the chest was performed using the standard protocol during bolus administration of intravenous contrast. Multiplanar CT image reconstructions and MIPs were obtained to evaluate the vascular anatomy. CONTRAST:  193mL OMNIPAQUE IOHEXOL 350 MG/ML SOLN COMPARISON:  CT 02/18/2020, 08/28/2019 FINDINGS: Cardiovascular: Central pulmonary arterial contrast bolus is satisfactory but with poor distal opacification further complicated by respiratory motion artifact. No large central or lobar filling defects are identified though more distal assessment is limited by these technical factors. Central pulmonary arteries are normal caliber. Normal heart  size. No pericardial effusion. Coronary artery calcifications and coronary stenting is present. The aortic root is suboptimally assessed given cardiac pulsation artifact. Atherosclerotic plaque within the normal caliber aorta. No acute luminal abnormality of the imaged aorta. No periaortic stranding or hemorrhage. Shared origin of the brachiocephalic and left common carotid arteries. Calcifications  proximal great vessels. No major venous abnormalities. Mediastinum/Nodes: No mediastinal fluid or gas. Normal thyroid gland and thoracic inlet. No acute abnormality of the trachea or esophagus. Few scattered subcentimeter lymph nodes again seen throughout the mediastinum and hila. No new enlarged or enlarging adenopathy is seen. Lungs/Pleura: Evaluation lung parenchyma limited by respiratory motion artifact. Diffuse mild airways thickening and scattered secretions. Dependent regions of basilar atelectasis. Underlying airspace disease is difficult to fully exclude. No visible effusion or pneumothorax. Stable 6 mm mm basilar nodule in the posterior left lower lobe (5/98). Small left basilar Bochdalek's hernia. Upper Abdomen: Small sliding-type hiatal hernia. No acute abnormalities present in the visualized portions of the upper abdomen. Musculoskeletal: Multilevel degenerative changes are present in the imaged portions of the spine. Dextrocurvature of the midthoracic spine. No acute osseous abnormality or suspicious osseous lesion. Review of the MIP images confirms the above findings. IMPRESSION: 1. Imaging quality is significantly degraded by respiratory motion artifact. 2. No large central or lobar filling defects are identified though more distal assessment is limited by technical factors. 3. Diffuse mild airways thickening and scattered secretions, could suggest an acute or chronic bronchitis/bronchiolitis. Some additional volume loss noted in the lung bases. Underlying airspace disease is difficult to fully exclude. 4.  Stable 6 mm basilar nodule in the posterior left lower lobe. May be post infectious or inflammatory given continued stability. Could consider additional 12 month follow-up CT. Please note typical Fleischner Society guidelines do not apply to this patient with known malignancy. 5. No new enlarged or enlarging adenopathy in the chest or upper abdomen. 6. Small sliding-type hiatal hernia. 7. Aortic Atherosclerosis (ICD10-I70.0). Electronically Signed   By: Lovena Le M.D.   On: 07/27/2020 04:13    Procedures .Critical Care Performed by: Joanne Gavel, PA-C Authorized by: Joanne Gavel, PA-C   Critical care provider statement:    Critical care time (minutes):  35   Critical care time was exclusive of:  Separately billable procedures and treating other patients and teaching time   Critical care was necessary to treat or prevent imminent or life-threatening deterioration of the following conditions:  Respiratory failure   Critical care was time spent personally by me on the following activities:  Ordering and performing treatments and interventions, ordering and review of laboratory studies, ordering and review of radiographic studies, re-evaluation of patient's condition, review of old charts, obtaining history from patient or surrogate, examination of patient, evaluation of patient's response to treatment and development of treatment plan with patient or surrogate   I assumed direction of critical care for this patient from another provider in my specialty: no     Care discussed with: admitting provider       Medications Ordered in ED Medications  insulin aspart (novoLOG) injection 0-9 Units (has no administration in time range)  insulin aspart (novoLOG) injection 0-5 Units (has no administration in time range)  furosemide (LASIX) injection 40 mg (has no administration in time range)  acetaminophen (TYLENOL) tablet 650 mg (has no administration in time range)    Or  acetaminophen (TYLENOL)  suppository 650 mg (has no administration in time range)  ondansetron (ZOFRAN) tablet 4 mg (has no administration in time range)    Or  ondansetron (ZOFRAN) injection 4 mg (has no administration in time range)  albuterol (PROVENTIL) (2.5 MG/3ML) 0.083% nebulizer solution 2.5 mg (has no administration in time range)  ipratropium-albuterol (DUONEB) 0.5-2.5 (3) MG/3ML nebulizer solution 3 mL (has no administration in time range)  fluticasone furoate-vilanterol (  BREO ELLIPTA) 200-25 MCG/INH 1 puff (has no administration in time range)  fosfomycin (MONUROL) packet 3 g (3 g Oral Not Given 07/27/20 0641)  azithromycin (ZITHROMAX) tablet 250 mg (has no administration in time range)  benzonatate (TESSALON) capsule 100 mg (has no administration in time range)  ipratropium-albuterol (DUONEB) 0.5-2.5 (3) MG/3ML nebulizer solution 3 mL (3 mLs Nebulization Given 07/26/20 2309)  furosemide (LASIX) injection 20 mg (20 mg Intravenous Given 07/26/20 2332)  oxyCODONE-acetaminophen (PERCOCET/ROXICET) 5-325 MG per tablet 1 tablet (1 tablet Oral Given 07/27/20 0135)  furosemide (LASIX) injection 40 mg (40 mg Intravenous Given 07/27/20 0136)  iohexol (OMNIPAQUE) 350 MG/ML injection 100 mL (100 mLs Intravenous Contrast Given 07/27/20 0221)    ED Course  I have reviewed the triage vital signs and the nursing notes.  Pertinent labs & imaging results that were available during my care of the patient were reviewed by me and considered in my medical decision making (see chart for details).    MDM Rules/Calculators/A&P                          80 year old female with a history of leukemia, chronic diastolic heart failure, chronic respiratory failure on 2 L home O2 (maintains at 96%), COPD, OSA, anemia, CAD, diabetes mellitus, CKD stage III who presents to the emergency department with worsening shortness of breath over the last few days accompanied by chest tightness, cough, leg swelling.  She has had a 15 pound weight gain  over the last 2 months.  Patient brought in on nonrebreather.  Able to be transitioned to nasal cannula in the ER.  Currently, she is on 3 L nasal cannula, up from her home baseline of 2 L.  She is mildly tachypneic.  Afebrile.  No tachycardia.  Labs and imaging of been reviewed and independently interpreted by me.  Troponin is elevated at 37 and 34 respectively.  Likely secondary to demand.  No history of previous elevated troponins.  EKG was sinus rhythm.  Chest x-ray with pulmonary edema with bilateral trace pleural effusions.  I suspect patient may be having acute on chronic congestive heart failure removed 15 pound weight gain.  Patient's home Lasix is as needed, and she has not been taking it.  Also, BNP is elevated to 237.  Normal on last evaluation.  Glucose is 292.  No evidence of DKA or HHS.  No metabolic derangements.  However, given worsening respiratory failure with only modest increase in BNP and known history of malignancy, will order CT PE study to ensure that worsening hypoxia is not secondary to PE.  No evidence of PE on PE study.  There is some concern for bronchitis or bronchiolitis.  However, imaging is degraded secondary to motion artifact.  Given worsening oxygen requirement, patient will require admission.  The patient was seen and independently evaluated by Dr. Darl Householder, attending physician.  Lasix initiated in the ER.  Doubt ACS, PE, aortic dissection, or malignant pleural effusion at this time.  Consult to the hospitalist team and Dr. Luna Fuse will accept the patient for admission. The patient appears reasonably stabilized for admission considering the current resources, flow, and capabilities available in the ED at this time, and I doubt any other [facility] requiring further screening and/or treatment in the ED prior to admission.   Final Clinical Impression(s) / ED Diagnoses Final diagnoses:  Acute on chronic respiratory failure with hypoxia (HCC)  Acute on chronic  diastolic congestive heart failure ([place])  Rx / DC Orders ED Discharge Orders    None       Joanne Gavel, PA-C 07/27/20 0754    Drenda Freeze, MD 07/28/20 0010

## 2020-07-26 NOTE — ED Triage Notes (Signed)
Pt coming from home due to increased SOB x1 week with productive cough and newly diagnosed with leukemia.  Pt a&ox4, normally wears 2L 02 at home with hx of copd and chf.  EMS reported pt was 85% on 2L, placed pt on 12lpm via [facility] with sats 100%.  Upon arrival to ER pt was 99% on room air, placed pt back on regular 2L via [place].

## 2020-07-27 ENCOUNTER — Emergency Department (HOSPITAL_COMMUNITY): Payer: PPO

## 2020-07-27 ENCOUNTER — Encounter (HOSPITAL_COMMUNITY): Payer: Self-pay

## 2020-07-27 DIAGNOSIS — J9621 Acute and chronic respiratory failure with hypoxia: Secondary | ICD-10-CM

## 2020-07-27 DIAGNOSIS — D638 Anemia in other chronic diseases classified elsewhere: Secondary | ICD-10-CM | POA: Diagnosis not present

## 2020-07-27 DIAGNOSIS — M5430 Sciatica, unspecified side: Secondary | ICD-10-CM | POA: Diagnosis present

## 2020-07-27 DIAGNOSIS — D649 Anemia, unspecified: Secondary | ICD-10-CM | POA: Diagnosis not present

## 2020-07-27 DIAGNOSIS — Z6841 Body Mass Index (BMI) 40.0 and over, adult: Secondary | ICD-10-CM | POA: Diagnosis not present

## 2020-07-27 DIAGNOSIS — J209 Acute bronchitis, unspecified: Secondary | ICD-10-CM

## 2020-07-27 DIAGNOSIS — E1165 Type 2 diabetes mellitus with hyperglycemia: Secondary | ICD-10-CM | POA: Diagnosis present

## 2020-07-27 DIAGNOSIS — I5032 Chronic diastolic (congestive) heart failure: Secondary | ICD-10-CM | POA: Diagnosis not present

## 2020-07-27 DIAGNOSIS — E785 Hyperlipidemia, unspecified: Secondary | ICD-10-CM | POA: Diagnosis present

## 2020-07-27 DIAGNOSIS — J9620 Acute and chronic respiratory failure, unspecified whether with hypoxia or hypercapnia: Secondary | ICD-10-CM | POA: Diagnosis present

## 2020-07-27 DIAGNOSIS — I130 Hypertensive heart and chronic kidney disease with heart failure and stage 1 through stage 4 chronic kidney disease, or unspecified chronic kidney disease: Secondary | ICD-10-CM | POA: Diagnosis present

## 2020-07-27 DIAGNOSIS — J440 Chronic obstructive pulmonary disease with acute lower respiratory infection: Secondary | ICD-10-CM

## 2020-07-27 DIAGNOSIS — J441 Chronic obstructive pulmonary disease with (acute) exacerbation: Secondary | ICD-10-CM | POA: Diagnosis present

## 2020-07-27 DIAGNOSIS — C9200 Acute myeloblastic leukemia, not having achieved remission: Secondary | ICD-10-CM | POA: Diagnosis present

## 2020-07-27 DIAGNOSIS — K219 Gastro-esophageal reflux disease without esophagitis: Secondary | ICD-10-CM | POA: Diagnosis present

## 2020-07-27 DIAGNOSIS — D61818 Other pancytopenia: Secondary | ICD-10-CM | POA: Diagnosis present

## 2020-07-27 DIAGNOSIS — B9620 Unspecified Escherichia coli [E. coli] as the cause of diseases classified elsewhere: Secondary | ICD-10-CM | POA: Diagnosis present

## 2020-07-27 DIAGNOSIS — Z85820 Personal history of malignant melanoma of skin: Secondary | ICD-10-CM | POA: Diagnosis not present

## 2020-07-27 DIAGNOSIS — I5033 Acute on chronic diastolic (congestive) heart failure: Secondary | ICD-10-CM | POA: Diagnosis present

## 2020-07-27 DIAGNOSIS — Z66 Do not resuscitate: Secondary | ICD-10-CM | POA: Diagnosis present

## 2020-07-27 DIAGNOSIS — Z515 Encounter for palliative care: Secondary | ICD-10-CM | POA: Diagnosis not present

## 2020-07-27 DIAGNOSIS — N1832 Chronic kidney disease, stage 3b: Secondary | ICD-10-CM | POA: Diagnosis present

## 2020-07-27 DIAGNOSIS — N179 Acute kidney failure, unspecified: Secondary | ICD-10-CM | POA: Diagnosis present

## 2020-07-27 DIAGNOSIS — Z7189 Other specified counseling: Secondary | ICD-10-CM | POA: Diagnosis not present

## 2020-07-27 DIAGNOSIS — G2581 Restless legs syndrome: Secondary | ICD-10-CM | POA: Diagnosis present

## 2020-07-27 DIAGNOSIS — Z20822 Contact with and (suspected) exposure to covid-19: Secondary | ICD-10-CM | POA: Diagnosis present

## 2020-07-27 DIAGNOSIS — G4733 Obstructive sleep apnea (adult) (pediatric): Secondary | ICD-10-CM | POA: Diagnosis present

## 2020-07-27 DIAGNOSIS — I2510 Atherosclerotic heart disease of native coronary artery without angina pectoris: Secondary | ICD-10-CM | POA: Diagnosis present

## 2020-07-27 DIAGNOSIS — N390 Urinary tract infection, site not specified: Secondary | ICD-10-CM | POA: Diagnosis present

## 2020-07-27 DIAGNOSIS — Z9981 Dependence on supplemental oxygen: Secondary | ICD-10-CM | POA: Diagnosis not present

## 2020-07-27 LAB — CBC WITH DIFFERENTIAL/PLATELET
Abs Immature Granulocytes: 0 10*3/uL (ref 0.00–0.07)
Abs Immature Granulocytes: 0.01 10*3/uL (ref 0.00–0.07)
Basophils Absolute: 0 10*3/uL (ref 0.0–0.1)
Basophils Absolute: 0 10*3/uL (ref 0.0–0.1)
Basophils Relative: 0 %
Basophils Relative: 0 %
Eosinophils Absolute: 0 10*3/uL (ref 0.0–0.5)
Eosinophils Absolute: 0.1 10*3/uL (ref 0.0–0.5)
Eosinophils Relative: 2 %
Eosinophils Relative: 4 %
HCT: 23.3 % — ABNORMAL LOW (ref 36.0–46.0)
HCT: 24 % — ABNORMAL LOW (ref 36.0–46.0)
Hemoglobin: 7.1 g/dL — ABNORMAL LOW (ref 12.0–15.0)
Hemoglobin: 7.4 g/dL — ABNORMAL LOW (ref 12.0–15.0)
Immature Granulocytes: 0 %
Immature Granulocytes: 1 %
Lymphocytes Relative: 25 %
Lymphocytes Relative: 38 %
Lymphs Abs: 0.4 10*3/uL — ABNORMAL LOW (ref 0.7–4.0)
Lymphs Abs: 0.7 10*3/uL (ref 0.7–4.0)
MCH: 34 pg (ref 26.0–34.0)
MCH: 34.1 pg — ABNORMAL HIGH (ref 26.0–34.0)
MCHC: 30.5 g/dL (ref 30.0–36.0)
MCHC: 30.8 g/dL (ref 30.0–36.0)
MCV: 111.5 fL — ABNORMAL HIGH (ref 80.0–100.0)
MCV: 110.6 fL — ABNORMAL HIGH (ref 80.0–100.0)
Monocytes Absolute: 0 10*3/uL — ABNORMAL LOW (ref 0.1–1.0)
Monocytes Absolute: 0.1 10*3/uL (ref 0.1–1.0)
Monocytes Relative: 3 %
Monocytes Relative: 3 %
Neutro Abs: 1 10*3/uL — ABNORMAL LOW (ref 1.7–7.7)
Neutro Abs: 0.9 10*3/uL — ABNORMAL LOW (ref 1.7–7.7)
Neutrophils Relative %: 70 %
Neutrophils Relative %: 54 %
Platelets: 96 10*3/uL — ABNORMAL LOW (ref 150–400)
Platelets: 100 10*3/uL — ABNORMAL LOW (ref 150–400)
RBC: 2.09 MIL/uL — ABNORMAL LOW (ref 3.87–5.11)
RBC: 2.17 MIL/uL — ABNORMAL LOW (ref 3.87–5.11)
RDW: 16.2 % — ABNORMAL HIGH (ref 11.5–15.5)
RDW: 16.2 % — ABNORMAL HIGH (ref 11.5–15.5)
WBC: 1.5 10*3/uL — ABNORMAL LOW (ref 4.0–10.5)
WBC: 1.7 10*3/uL — ABNORMAL LOW (ref 4.0–10.5)
nRBC: 0 % (ref 0.0–0.2)
nRBC: 0 % (ref 0.0–0.2)

## 2020-07-27 LAB — COMPREHENSIVE METABOLIC PANEL
ALT: 16 U/L (ref 0–44)
AST: 16 U/L (ref 15–41)
Albumin: 3.5 g/dL (ref 3.5–5.0)
Alkaline Phosphatase: 56 U/L (ref 38–126)
Anion gap: 9 (ref 5–15)
BUN: 28 mg/dL — ABNORMAL HIGH (ref 8–23)
CO2: 27 mmol/L (ref 22–32)
Calcium: 8.7 mg/dL — ABNORMAL LOW (ref 8.9–10.3)
Chloride: 103 mmol/L (ref 98–111)
Creatinine, Ser: 1.21 mg/dL — ABNORMAL HIGH (ref 0.44–1.00)
GFR, Estimated: 46 mL/min — ABNORMAL LOW (ref >60)
Glucose, Bld: 292 mg/dL — ABNORMAL HIGH (ref 70–99)
Potassium: 3.9 mmol/L (ref 3.5–5.1)
Sodium: 139 mmol/L (ref 135–145)
Total Bilirubin: 0.4 mg/dL (ref 0.3–1.2)
Total Protein: 6.3 g/dL — ABNORMAL LOW (ref 6.5–8.1)

## 2020-07-27 LAB — BASIC METABOLIC PANEL
Anion gap: 11 (ref 5–15)
BUN: 27 mg/dL — ABNORMAL HIGH (ref 8–23)
CO2: 28 mmol/L (ref 22–32)
Calcium: 8.8 mg/dL — ABNORMAL LOW (ref 8.9–10.3)
Chloride: 100 mmol/L (ref 98–111)
Creatinine, Ser: 1.34 mg/dL — ABNORMAL HIGH (ref 0.44–1.00)
GFR, Estimated: 40 mL/min — ABNORMAL LOW (ref >60)
Glucose, Bld: 201 mg/dL — ABNORMAL HIGH (ref 70–99)
Potassium: 3.7 mmol/L (ref 3.5–5.1)
Sodium: 139 mmol/L (ref 135–145)

## 2020-07-27 LAB — TROPONIN I (HIGH SENSITIVITY)
Troponin I (High Sensitivity): 34 ng/L — ABNORMAL HIGH (ref <18)
Troponin I (High Sensitivity): 37 ng/L — ABNORMAL HIGH (ref <18)

## 2020-07-27 LAB — RESP PANEL BY RT-PCR (FLU A&B, COVID) ARPGX2
Influenza A by PCR: NEGATIVE
Influenza B by PCR: NEGATIVE
SARS Coronavirus 2 by RT PCR: NEGATIVE

## 2020-07-27 LAB — GLUCOSE, CAPILLARY
Glucose-Capillary: 301 mg/dL — ABNORMAL HIGH (ref 70–99)
Glucose-Capillary: 259 mg/dL — ABNORMAL HIGH (ref 70–99)
Glucose-Capillary: 267 mg/dL — ABNORMAL HIGH (ref 70–99)

## 2020-07-27 LAB — MAGNESIUM: Magnesium: 2 mg/dL (ref 1.7–2.4)

## 2020-07-27 LAB — PHOSPHORUS: Phosphorus: 3.8 mg/dL (ref 2.5–4.6)

## 2020-07-27 LAB — BRAIN NATRIURETIC PEPTIDE: B Natriuretic Peptide: 237.9 pg/mL — ABNORMAL HIGH (ref 0.0–100.0)

## 2020-07-27 MED ORDER — ACETAMINOPHEN 325 MG PO TABS
650.0000 mg | ORAL_TABLET | Freq: Four times a day (QID) | ORAL | Status: DC | PRN
  Administered 2020-07-27 – 2020-07-29 (×2): 650 mg via ORAL
  Filled 2020-07-27 (×2): qty 2

## 2020-07-27 MED ORDER — ROPINIROLE HCL 1 MG PO TABS
3.0000 mg | ORAL_TABLET | Freq: Three times a day (TID) | ORAL | Status: DC
  Administered 2020-07-27 – 2020-07-31 (×12): 3 mg via ORAL
  Filled 2020-07-27 (×13): qty 3

## 2020-07-27 MED ORDER — INSULIN ASPART 100 UNIT/ML ~~LOC~~ SOLN
0.0000 [IU] | Freq: Every day | SUBCUTANEOUS | Status: DC
  Administered 2020-07-27 – 2020-07-29 (×3): 2 [IU] via SUBCUTANEOUS
  Administered 2020-07-30: 3 [IU] via SUBCUTANEOUS
  Filled 2020-07-27: qty 0.05

## 2020-07-27 MED ORDER — ONDANSETRON HCL 4 MG PO TABS: 4.0000 mg | ORAL_TABLET | Freq: Four times a day (QID) | ORAL | Status: DC | PRN

## 2020-07-27 MED ORDER — AZITHROMYCIN 250 MG PO TABS
250.0000 mg | ORAL_TABLET | Freq: Every day | ORAL | Status: AC
  Administered 2020-07-27 – 2020-07-31 (×5): 250 mg via ORAL
  Filled 2020-07-27 (×5): qty 1

## 2020-07-27 MED ORDER — SODIUM CHLORIDE 0.9 % IV SOLN
1.0000 g | INTRAVENOUS | Status: AC
  Administered 2020-07-27 – 2020-07-31 (×5): 1 g via INTRAVENOUS
  Filled 2020-07-27 (×5): qty 10

## 2020-07-27 MED ORDER — HYDROCODONE-ACETAMINOPHEN 5-325 MG PO TABS
1.0000 | ORAL_TABLET | Freq: Four times a day (QID) | ORAL | Status: DC | PRN
  Administered 2020-07-27 – 2020-07-31 (×4): 1 via ORAL
  Filled 2020-07-27 (×4): qty 1

## 2020-07-27 MED ORDER — ESCITALOPRAM OXALATE 20 MG PO TABS
20.0000 mg | ORAL_TABLET | Freq: Every day | ORAL | Status: DC
  Administered 2020-07-27 – 2020-07-31 (×5): 20 mg via ORAL
  Filled 2020-07-27 (×5): qty 1

## 2020-07-27 MED ORDER — ALBUTEROL SULFATE (2.5 MG/3ML) 0.083% IN NEBU: 2.5000 mg | INHALATION_SOLUTION | RESPIRATORY_TRACT | Status: DC | PRN

## 2020-07-27 MED ORDER — PANTOPRAZOLE SODIUM 40 MG PO TBEC
40.0000 mg | DELAYED_RELEASE_TABLET | Freq: Two times a day (BID) | ORAL | Status: DC
  Administered 2020-07-27 – 2020-07-31 (×9): 40 mg via ORAL
  Filled 2020-07-27 (×9): qty 1

## 2020-07-27 MED ORDER — TIZANIDINE HCL 4 MG PO TABS: 4.0000 mg | ORAL_TABLET | Freq: Every day | ORAL | Status: DC

## 2020-07-27 MED ORDER — FOSFOMYCIN TROMETHAMINE 3 G PO PACK
3.0000 g | PACK | Freq: Once | ORAL | Status: DC
  Filled 2020-07-27: qty 3

## 2020-07-27 MED ORDER — ONDANSETRON HCL 4 MG/2ML IJ SOLN: 4.0000 mg | Freq: Four times a day (QID) | INTRAMUSCULAR | Status: DC | PRN

## 2020-07-27 MED ORDER — IOHEXOL 350 MG/ML SOLN
100.0000 mL | Freq: Once | INTRAVENOUS | Status: AC | PRN
  Administered 2020-07-27: 100 mL via INTRAVENOUS

## 2020-07-27 MED ORDER — KETOCONAZOLE 2 % EX CREA
1.0000 "application " | TOPICAL_CREAM | Freq: Two times a day (BID) | CUTANEOUS | Status: DC
  Administered 2020-07-27 – 2020-07-31 (×8): 1 via TOPICAL
  Filled 2020-07-27 (×2): qty 15

## 2020-07-27 MED ORDER — FUROSEMIDE 10 MG/ML IJ SOLN
40.0000 mg | Freq: Every day | INTRAMUSCULAR | Status: DC
  Administered 2020-07-27: 40 mg via INTRAVENOUS
  Filled 2020-07-27: qty 4

## 2020-07-27 MED ORDER — OXYCODONE-ACETAMINOPHEN 5-325 MG PO TABS
1.0000 | ORAL_TABLET | Freq: Once | ORAL | Status: AC
  Administered 2020-07-27: 1 via ORAL
  Filled 2020-07-27: qty 1

## 2020-07-27 MED ORDER — FUROSEMIDE 10 MG/ML IJ SOLN
40.0000 mg | Freq: Two times a day (BID) | INTRAMUSCULAR | Status: DC
  Administered 2020-07-27: 40 mg via INTRAVENOUS
  Filled 2020-07-27: qty 4

## 2020-07-27 MED ORDER — GABAPENTIN 100 MG PO CAPS
200.0000 mg | ORAL_CAPSULE | Freq: Two times a day (BID) | ORAL | Status: DC
  Administered 2020-07-27 – 2020-07-31 (×8): 200 mg via ORAL
  Filled 2020-07-27 (×8): qty 2

## 2020-07-27 MED ORDER — METOPROLOL SUCCINATE ER 25 MG PO TB24
25.0000 mg | ORAL_TABLET | Freq: Every day | ORAL | Status: DC
  Administered 2020-07-27 – 2020-07-30 (×4): 25 mg via ORAL
  Filled 2020-07-27 (×4): qty 1

## 2020-07-27 MED ORDER — TIZANIDINE HCL 4 MG PO TABS
2.0000 mg | ORAL_TABLET | Freq: Every evening | ORAL | Status: DC | PRN
  Administered 2020-07-27 – 2020-07-30 (×3): 2 mg via ORAL
  Filled 2020-07-27 (×3): qty 1

## 2020-07-27 MED ORDER — BENZONATATE 100 MG PO CAPS: 100.0000 mg | ORAL_CAPSULE | Freq: Two times a day (BID) | ORAL | Status: DC | PRN

## 2020-07-27 MED ORDER — GABAPENTIN 100 MG PO CAPS
100.0000 mg | ORAL_CAPSULE | Freq: Three times a day (TID) | ORAL | Status: DC
  Administered 2020-07-27: 100 mg via ORAL
  Filled 2020-07-27: qty 1

## 2020-07-27 MED ORDER — INSULIN ASPART 100 UNIT/ML ~~LOC~~ SOLN
0.0000 [IU] | Freq: Three times a day (TID) | SUBCUTANEOUS | Status: DC
  Administered 2020-07-27: 3 [IU] via SUBCUTANEOUS
  Administered 2020-07-27: 7 [IU] via SUBCUTANEOUS
  Administered 2020-07-27: 5 [IU] via SUBCUTANEOUS
  Filled 2020-07-27: qty 0.09

## 2020-07-27 MED ORDER — IPRATROPIUM-ALBUTEROL 0.5-2.5 (3) MG/3ML IN SOLN
3.0000 mL | Freq: Three times a day (TID) | RESPIRATORY_TRACT | Status: DC
  Administered 2020-07-28 – 2020-07-31 (×10): 3 mL via RESPIRATORY_TRACT
  Filled 2020-07-27 (×11): qty 3

## 2020-07-27 MED ORDER — FUROSEMIDE 10 MG/ML IJ SOLN
40.0000 mg | Freq: Once | INTRAMUSCULAR | Status: AC
  Administered 2020-07-27: 40 mg via INTRAVENOUS
  Filled 2020-07-27: qty 4

## 2020-07-27 MED ORDER — ATORVASTATIN CALCIUM 40 MG PO TABS
80.0000 mg | ORAL_TABLET | Freq: Every evening | ORAL | Status: DC
  Administered 2020-07-27 – 2020-07-30 (×4): 80 mg via ORAL
  Filled 2020-07-27 (×4): qty 2

## 2020-07-27 MED ORDER — BUPROPION HCL ER (XL) 300 MG PO TB24: 300.0000 mg | ORAL_TABLET | Freq: Every day | ORAL | Status: DC

## 2020-07-27 MED ORDER — INSULIN GLARGINE 100 UNIT/ML ~~LOC~~ SOLN
35.0000 [IU] | Freq: Every day | SUBCUTANEOUS | Status: DC
  Administered 2020-07-27: 35 [IU] via SUBCUTANEOUS
  Filled 2020-07-27 (×2): qty 0.35

## 2020-07-27 MED ORDER — FLUTICASONE FUROATE-VILANTEROL 200-25 MCG/INH IN AEPB
1.0000 | INHALATION_SPRAY | Freq: Every day | RESPIRATORY_TRACT | Status: DC
  Administered 2020-07-27 – 2020-07-31 (×5): 1 via RESPIRATORY_TRACT
  Filled 2020-07-27: qty 28

## 2020-07-27 MED ORDER — IPRATROPIUM-ALBUTEROL 0.5-2.5 (3) MG/3ML IN SOLN
3.0000 mL | Freq: Four times a day (QID) | RESPIRATORY_TRACT | Status: DC
  Administered 2020-07-27 (×3): 3 mL via RESPIRATORY_TRACT
  Filled 2020-07-27 (×3): qty 3

## 2020-07-27 MED ORDER — ASPIRIN EC 81 MG PO TBEC
81.0000 mg | DELAYED_RELEASE_TABLET | Freq: Every day | ORAL | Status: DC
Start: 2020-07-27 — End: 2020-07-31
  Administered 2020-07-27 – 2020-07-31 (×5): 81 mg via ORAL
  Filled 2020-07-27 (×5): qty 1

## 2020-07-27 MED ORDER — ACETAMINOPHEN 650 MG RE SUPP: 650.0000 mg | Freq: Four times a day (QID) | RECTAL | Status: DC | PRN

## 2020-07-27 MED ORDER — FERROUS SULFATE 325 (65 FE) MG PO TABS
325.0000 mg | ORAL_TABLET | Freq: Two times a day (BID) | ORAL | Status: DC
  Administered 2020-07-27 – 2020-07-31 (×8): 325 mg via ORAL
  Filled 2020-07-27 (×8): qty 1

## 2020-07-27 NOTE — ED Notes (Signed)
Vital signs stable. 

## 2020-07-27 NOTE — Progress Notes (Signed)
PROGRESS NOTE  Janet Choi WNU:[id number] DOB: [birth year] DOA: 07/26/2020 PCP: [name], MD  HPI/Recap of past 24 hours: HPI from Dr Parke Poisson is a 80 y.o. female, with PMH of AML, HFpEF, chronic hypoxic respiratory failure on 2 L nasal cannula, COPD, OSA, CAD, insulin-dependent diabetes, CKD 3 who presented to the ER on 07/26/2020 with worsening dyspnea. Patient states that over the past week or so she has had progressive worsening of dyspnea. She says she has been compliant with her inhaler/rescue inhaler regimen with no significant improvement.  She noted a 15 pound weight gain over the past 2 months and recently had to increase her supplemental O2.  She was recently seen by her PCP, and she was advised to start taking Lasix 40 mg daily schedule.  Previously she was on as needed Lasix.  She was also recently diagnosed with UTI and started on Macrobid.  Due to worsening symptoms, EMS was activated. On EMS arrival, patient was satting 85% on 2 L.  She was placed on nonrebreather with improvement in sats and brought to the ED. in the ED, saturation was noted to be 85% on 2 L of O2, other vital signs remained stable.  Labs showed hyperglycemia, leukopenia.  CTA chest negative for PE but did show bronchitis.  Patient was admitted for further management.    Today, patient denies any worsening shortness of breath, no significant improvement as of today, denies any chest pain, abdominal pain, nausea/vomiting, fever/chills.  Does report a history of sciatica causing unilateral leg pain.  Discussed with sister at bedside.    Assessment/Plan: Active Problems:   Acute on chronic respiratory failure (HCC)   Acute on chronic hypoxic respiratory failure Currently requiring about 3 L of O2, tachypneic Likely 2/2 COPD with bronchitis versus volume overload CTA chest negative for PE but showed signs of bronchitis Continue Zithromax, added on IV ceftriaxone to cover for any  pneumonia Continue duo nebs, inhalers Supplemental oxygen as needed  Acute on chronic diastolic HF Noted significant weight gain BNP 237, could be falsely low in obese patients Noted pulmonary edema on chest x-ray Troponin with flat trend, EKG showed no acute ST changes Echo done in 12/2019 showed grade 2 diastolic dysfunction, with preserved EF Continue IV Lasix Strict I's and O's, daily weights  History of CAD Currently chest pain-free Continue aspirin, metoprolol, Lipitor  COPD with possible bronchitis Management as above  E. coli UTI Urine culture grew E. coli Start IV ceftriaxone  History of chronic pancytopenia/macrocytic anemia History of AML Monitor CBC closely  Diabetes mellitus type 2, uncontrolled with hyperglycemia History of neuropathy A1c 7.9 on 07/23/2020 SSI, Accu-Cheks, hypoglycemic protocol, insulin glargine Continue gabapentin  Restless leg/sciatica Continue ropinirole, Zanaflex  Morbid obesity Lifestyle modification advised      Estimated body mass index is 48.95 kg/m as calculated from the following:   Height as of this encounter: 5\' 1"  (1.549 m).   Weight as of this encounter: 117.5 kg.     Code Status: Full  Family Communication: Discussed with sister at bedside on 07/27/2020  Disposition Plan: Status is: Inpatient  The patient will require care spanning > 2 midnights and should be moved to inpatient because: Inpatient level of care appropriate due to severity of illness  Dispo: The patient is from: Home              Anticipated d/c is to: Home  Patient currently is not medically stable to d/c.   Difficult to place patient No    Consultants:  None  Procedures:  None  Antimicrobials:  Zithromax  Ceftriaxone  DVT prophylaxis: SCDs for now due to thrombocytopenia   Objective: Vitals:   07/27/20 0430 07/27/20 0530 07/27/20 0713 07/27/20 0903  BP: (!) 143/63 (!) 143/57 (!) 149/58   Pulse: 76 77 76    Resp: (!) 22 (!) 23 (!) 22   Temp:  99 F (37.2 C) 98.8 F (37.1 C)   TempSrc:  Oral Oral   SpO2: 95% 97% 98% 96%  Weight:      Height:        Intake/Output Summary (Last 24 hours) at 07/27/2020 1338 Last data filed at 07/27/2020 1100 Gross per 24 hour  Intake 120 ml  Output 1100 ml  Net -980 ml   Filed Weights   07/26/20 2235 07/27/20 0241  Weight: 117.5 kg 117.5 kg    Exam:  General: NAD   Cardiovascular: S1, S2 present  Respiratory:  Diminished breath sounds bilaterally  Abdomen: Soft, nontender, obese, nondistended, bowel sounds present  Musculoskeletal: Trace bilateral pedal edema noted  Skin:  Chronic venous stasis/multiple scabs noted on BLE  Psychiatry: Normal mood    Data Reviewed: CBC: Recent Labs  Lab 07/23/20 1538 07/26/20 2323 07/27/20 0955  WBC 1.6* 1.5* 1.7*  NEUTROABS 738* 1.0* 0.9*  HGB 8.5* 7.1* 7.4*  HCT 25.9* 23.3* 24.0*  MCV 103.2* 111.5* 110.6*  PLT 110* 96* 400*   Basic Metabolic Panel: Recent Labs  Lab 07/23/20 1538 07/26/20 2323 07/27/20 0955  NA 143 139 139  K 4.6 3.9 3.7  CL 106 103 100  CO2 30 27 28   GLUCOSE 102* 292* 201*  BUN 28* 28* 27*  CREATININE 1.29* 1.21* 1.34*  CALCIUM 9.4 8.7* 8.8*  MG  --   --  2.0  PHOS  --   --  3.8   GFR: Estimated Creatinine Clearance: 40.7 mL/min (A) (by C-G formula based on SCr of 1.34 mg/dL (H)). Liver Function Tests: Recent Labs  Lab 07/23/20 1538 07/26/20 2323  AST 18 16  ALT 12 16  ALKPHOS  --  56  BILITOT 0.4 0.4  PROT 6.2 6.3*  ALBUMIN  --  3.5   No results for input(s): LIPASE, AMYLASE in the last 168 hours. No results for input(s): AMMONIA in the last 168 hours. Coagulation Profile: No results for input(s): INR, PROTIME in the last 168 hours. Cardiac Enzymes: No results for input(s): CKTOTAL, CKMB, CKMBINDEX, TROPONINI in the last 168 hours. BNP (last 3 results) No results for input(s): PROBNP in the last 8760 hours. HbA1C: No results for input(s): HGBA1C  in the last 72 hours. CBG: Recent Labs  Lab 07/27/20 1225  GLUCAP 301*   Lipid Profile: No results for input(s): CHOL, HDL, LDLCALC, TRIG, CHOLHDL, LDLDIRECT in the last 72 hours. Thyroid Function Tests: No results for input(s): TSH, T4TOTAL, FREET4, T3FREE, THYROIDAB in the last 72 hours. Anemia Panel: No results for input(s): VITAMINB12, FOLATE, FERRITIN, TIBC, IRON, RETICCTPCT in the last 72 hours. Urine analysis:    Component Value Date/Time   COLORURINE DARK YELLOW 07/23/2020 1538   APPEARANCEUR CLEAR 07/23/2020 1538   LABSPEC 1.019 07/23/2020 1538   PHURINE < OR = 5.0 07/23/2020 1538   GLUCOSEU NEGATIVE 07/23/2020 1538   HGBUR TRACE (A) 07/23/2020 1538   BILIRUBINUR (A) 12/28/2019 1153    TEST NOT REPORTED DUE TO COLOR INTERFERENCE OF URINE PIGMENT  KETONESUR NEGATIVE 07/23/2020 1538   PROTEINUR 2+ (A) 07/23/2020 1538   UROBILINOGEN 0.2 08/27/2013 1427   NITRITE (A) 12/28/2019 1153    TEST NOT REPORTED DUE TO COLOR INTERFERENCE OF URINE PIGMENT   LEUKOCYTESUR (A) 12/28/2019 1153    TEST NOT REPORTED DUE TO COLOR INTERFERENCE OF URINE PIGMENT   Sepsis Labs: @LABRCNTIP (procalcitonin:4,lacticidven:4)  ) Recent Results (from the past 240 hour(s))  Urine Culture     Status: Abnormal   Collection Time: 07/23/20  3:38 PM  Result Value Ref Range Status   MICRO NUMBER: [id number]  Final   SPECIMEN QUALITY: Adequate  Final   Sample Source URINE  Final   STATUS: FINAL  Final   ISOLATE 1: Escherichia coli (A)  Final    Comment: Greater than 100,000 CFU/mL of Escherichia coli      Susceptibility   Escherichia coli - URINE CULTURE, REFLEX    AMOX/CLAVULANIC >=32 Resistant     AMPICILLIN >=32 Resistant     AMPICILLIN/SULBACTAM >=32 Resistant     CEFAZOLIN* 16 Resistant      * For uncomplicated UTI caused by E. coli,K. pneumoniae or P. mirabilis: Cefazolin issusceptible if MIC <32 mcg/mL and predictssusceptible to the oral agents cefaclor, cefdinir,cefpodoxime, cefprozil,  cefuroxime, cephalexinand loracarbef.    CEFEPIME <=1 Sensitive     CEFTRIAXONE <=1 Sensitive     CIPROFLOXACIN <=0.25 Sensitive     LEVOFLOXACIN <=0.12 Sensitive     ERTAPENEM <=0.5 Sensitive     GENTAMICIN <=1 Sensitive     IMIPENEM <=0.25 Sensitive     NITROFURANTOIN 32 Sensitive     PIP/TAZO <=4 Sensitive     TOBRAMYCIN <=1 Sensitive     TRIMETH/SULFA* <=20 Sensitive      * For uncomplicated UTI caused by E. coli,K. pneumoniae or P. mirabilis: Cefazolin issusceptible if MIC <32 mcg/mL and predictssusceptible to the oral agents cefaclor, cefdinir,cefpodoxime, cefprozil, cefuroxime, cephalexinand loracarbef.Legend:S = Susceptible  I = IntermediateR = Resistant  NS = Not susceptible* = Not tested  NR = Not reported**NN = See antimicrobic comments  Resp Panel by RT-PCR (Flu A&B, Covid) Nasopharyngeal Swab     Status: None   Collection Time: 07/27/20  2:55 AM   Specimen: Nasopharyngeal Swab; Nasopharyngeal(NP) swabs in vial transport medium  Result Value Ref Range Status   SARS Coronavirus 2 by RT PCR NEGATIVE NEGATIVE Final    Comment: (NOTE) SARS-CoV-2 target nucleic acids are NOT DETECTED.  The SARS-CoV-2 RNA is generally detectable in upper respiratory specimens during the acute phase of infection. The lowest concentration of SARS-CoV-2 viral copies this assay can detect is 138 copies/mL. A negative result does not preclude SARS-Cov-2 infection and should not be used as the sole basis for treatment or other patient management decisions. A negative result may occur with  improper specimen collection/handling, submission of specimen other than nasopharyngeal swab, presence of viral mutation(s) within the areas targeted by this assay, and inadequate number of viral copies(<138 copies/mL). A negative result must be combined with clinical observations, patient history, and epidemiological information. The expected result is Negative.  Fact Sheet for Patients:   EntrepreneurPulse.com.au  Fact Sheet for Healthcare Providers:  IncredibleEmployment.be  This test is no t yet approved or cleared by the [place] FDA and  has been authorized for detection and/or diagnosis of SARS-CoV-2 by FDA under an Emergency Use Authorization (EUA). This EUA will remain  in effect (meaning this test can be used) for the duration of the COVID-19 declaration under Section 564(b)(1) of the  Act, 21 U.S.C.section 360bbb-3(b)(1), unless the authorization is terminated  or revoked sooner.       Influenza A by PCR NEGATIVE NEGATIVE Final   Influenza B by PCR NEGATIVE NEGATIVE Final    Comment: (NOTE) The Xpert Xpress SARS-CoV-2/FLU/RSV plus assay is intended as an aid in the diagnosis of influenza from Nasopharyngeal swab specimens and should not be used as a sole basis for treatment. Nasal washings and aspirates are unacceptable for Xpert Xpress SARS-CoV-2/FLU/RSV testing.  Fact Sheet for Patients: EntrepreneurPulse.com.au  Fact Sheet for Healthcare Providers: IncredibleEmployment.be  This test is not yet approved or cleared by the [place] FDA and has been authorized for detection and/or diagnosis of SARS-CoV-2 by FDA under an Emergency Use Authorization (EUA). This EUA will remain in effect (meaning this test can be used) for the duration of the COVID-19 declaration under Section 564(b)(1) of the Act, 21 U.S.C. section 360bbb-3(b)(1), unless the authorization is terminated or revoked.  Performed at [facility], [place] [address] [place], [place] [address]       Studies: DG Chest 2 View  Result Date: 07/26/2020 CLINICAL DATA:  Shortness of breath. EXAM: CHEST - 2 VIEW COMPARISON:  Chest x-ray 12/26/2019, CT chest 02/18/2020 FINDINGS: The heart size and mediastinal contours are unchanged. Prominence of the hilar vasculature. Aortic arch calcifications. No  focal consolidation. No pulmonary edema. Trace bilateral pleural effusions. No pneumothorax. No acute osseous abnormality. IMPRESSION: Pulmonary edema with bilateral trace pleural effusions. Electronically Signed   By: Iven Finn M.D.   On: 07/26/2020 23:21   CT Angio Chest PE W and/or Wo Contrast  Result Date: 07/27/2020 CLINICAL DATA:  Shortness of breath for 1 week with productive cough, newly diagnosed leukemia EXAM: CT ANGIOGRAPHY CHEST WITH CONTRAST TECHNIQUE: Multidetector CT imaging of the chest was performed using the standard protocol during bolus administration of intravenous contrast. Multiplanar CT image reconstructions and MIPs were obtained to evaluate the vascular anatomy. CONTRAST:  111mL OMNIPAQUE IOHEXOL 350 MG/ML SOLN COMPARISON:  CT 02/18/2020, 08/28/2019 FINDINGS: Cardiovascular: Central pulmonary arterial contrast bolus is satisfactory but with poor distal opacification further complicated by respiratory motion artifact. No large central or lobar filling defects are identified though more distal assessment is limited by these technical factors. Central pulmonary arteries are normal caliber. Normal heart size. No pericardial effusion. Coronary artery calcifications and coronary stenting is present. The aortic root is suboptimally assessed given cardiac pulsation artifact. Atherosclerotic plaque within the normal caliber aorta. No acute luminal abnormality of the imaged aorta. No periaortic stranding or hemorrhage. Shared origin of the brachiocephalic and left common carotid arteries. Calcifications proximal great vessels. No major venous abnormalities. Mediastinum/Nodes: No mediastinal fluid or gas. Normal thyroid gland and thoracic inlet. No acute abnormality of the trachea or esophagus. Few scattered subcentimeter lymph nodes again seen throughout the mediastinum and hila. No new enlarged or enlarging adenopathy is seen. Lungs/Pleura: Evaluation lung parenchyma limited by respiratory  motion artifact. Diffuse mild airways thickening and scattered secretions. Dependent regions of basilar atelectasis. Underlying airspace disease is difficult to fully exclude. No visible effusion or pneumothorax. Stable 6 mm mm basilar nodule in the posterior left lower lobe (5/98). Small left basilar Bochdalek's hernia. Upper Abdomen: Small sliding-type hiatal hernia. No acute abnormalities present in the visualized portions of the upper abdomen. Musculoskeletal: Multilevel degenerative changes are present in the imaged portions of the spine. Dextrocurvature of the midthoracic spine. No acute osseous abnormality or suspicious osseous lesion. Review of the MIP images confirms the above findings.  IMPRESSION: 1. Imaging quality is significantly degraded by respiratory motion artifact. 2. No large central or lobar filling defects are identified though more distal assessment is limited by technical factors. 3. Diffuse mild airways thickening and scattered secretions, could suggest an acute or chronic bronchitis/bronchiolitis. Some additional volume loss noted in the lung bases. Underlying airspace disease is difficult to fully exclude. 4. Stable 6 mm basilar nodule in the posterior left lower lobe. May be post infectious or inflammatory given continued stability. Could consider additional 12 month follow-up CT. Please note typical Fleischner Society guidelines do not apply to this patient with known malignancy. 5. No new enlarged or enlarging adenopathy in the chest or upper abdomen. 6. Small sliding-type hiatal hernia. 7. Aortic Atherosclerosis (ICD10-I70.0). Electronically Signed   By: Lovena Le M.D.   On: 07/27/2020 04:13    Scheduled Meds: . azithromycin  250 mg Oral Daily  . fluticasone furoate-vilanterol  1 puff Inhalation Daily  . furosemide  40 mg Intravenous Daily  . insulin aspart  0-5 Units Subcutaneous QHS  . insulin aspart  0-9 Units Subcutaneous TID WC  . ipratropium-albuterol  3 mL  Nebulization Q6H    Continuous Infusions: . cefTRIAXone (ROCEPHIN)  IV 1 g (07/27/20 0934)     LOS: 0 days     Alma Friendly, MD [facility]  If 7PM-7AM, please contact night-coverage [email or web address] 07/27/2020, 1:38 PM

## 2020-07-27 NOTE — Evaluation (Signed)
Physical Therapy Evaluation Patient Details Name: Janet Choi MRN: [id number] DOB: [birth year] Today's Date: 07/27/2020   History of Present Illness  Pt admitted acute on chronic respiratory failure (2L home O2) and acute on chronic diastolic heart failure.  Pt with hx of COPD, CHF, DM, CKD, CAD, and with new dx of AML.  Clinical Impression  Pt admitted as above and presenting with functional mobility limitations 2* generalized weakness, ambulatory balance deficits and limited endurance.  Pt should progress to dc home with 24/7 assist of family/friends.    Follow Up Recommendations No PT follow up    Equipment Recommendations  None recommended by PT    Recommendations for Other Services OT consult     Precautions / Restrictions Precautions Precautions: Fall Restrictions Weight Bearing Restrictions: No      Mobility  Bed Mobility               General bed mobility comments: NT - pt up in chair on arrival to room and requests back to same    Transfers Overall transfer level: Needs assistance Equipment used: Rolling walker (2 wheeled) Transfers: Sit to/from Stand Sit to Stand: Min guard         General transfer comment: steady assist only  Ambulation/Gait Ambulation/Gait assistance: Min guard Gait Distance (Feet): 200 Feet Assistive device: Rolling walker (2 wheeled) Gait Pattern/deviations: Step-through pattern;Decreased step length - right;Decreased step length - left;Shuffle;Trunk flexed Gait velocity: decr   General Gait Details: min cues for posture and position from RW; multiple standing rest breaks to complete task  Stairs            Wheelchair Mobility    Modified Rankin (Stroke Patients Only)       Balance Overall balance assessment: Needs assistance Sitting-balance support: No upper extremity supported;Feet supported Sitting balance-Leahy Scale: Good     Standing balance support: No upper extremity supported Standing balance-Leahy  Scale: Fair                               Pertinent Vitals/Pain Pain Assessment: 0-10 Pain Score: 4  Pain Location: back and legs Pain Descriptors / Indicators: Aching Pain Intervention(s): Limited activity within patient's tolerance;Monitored during session;Patient requesting pain meds-RN notified    Home Living Family/patient expects to be discharged to:: Private residence Living Arrangements: Non-relatives/Friends;Children Available Help at Discharge: Family Type of Home: House Home Access: Stairs to enter Entrance Stairs-Rails: Can reach both Entrance Stairs-Number of Steps: 4 Home Layout: One level Home Equipment: [place] - 2 wheels;Shower seat;Grab bars - tub/shower;Walker - 4 wheels;Transport chair;Bedside commode      Prior Function Level of Independence: Independent;Independent with assistive device(s)               Hand Dominance   Dominant Hand: Right    Extremity/Trunk Assessment   Upper Extremity Assessment Upper Extremity Assessment: Generalized weakness    Lower Extremity Assessment Lower Extremity Assessment: Generalized weakness       Communication   Communication: No difficulties  Cognition Arousal/Alertness: Awake/alert Behavior During Therapy: WFL for tasks assessed/performed Overall Cognitive Status: Within Functional Limits for tasks assessed                                        General Comments      Exercises     Assessment/Plan  PT Assessment Patient needs continued PT services  PT Problem List Decreased strength;Decreased activity tolerance;Decreased balance;Decreased mobility;Decreased knowledge of use of DME;Obesity;Pain       PT Treatment Interventions DME instruction;Gait training;Stair training;Functional mobility training;Therapeutic activities;Therapeutic exercise;Patient/family education;Balance training    PT Goals (Current goals can be found in the Care Plan section)   Acute Rehab PT Goals Patient Stated Goal: Regain IND PT Goal Formulation: With patient Time For Goal Achievement: 08/10/20 Potential to Achieve Goals: Good    Frequency Min 3X/week   Barriers to discharge        Co-evaluation               AM-PAC PT "6 Clicks" Mobility  Outcome Measure Help needed turning from your back to your side while in a flat bed without using bedrails?: None Help needed moving from lying on your back to sitting on the side of a flat bed without using bedrails?: None Help needed moving to and from a bed to a chair (including a wheelchair)?: A Little Help needed standing up from a chair using your arms (e.g., wheelchair or bedside chair)?: A Little Help needed to walk in hospital room?: A Little Help needed climbing 3-5 steps with a railing? : A Little 6 Click Score: 20    End of Session Equipment Utilized During Treatment: Gait belt Activity Tolerance: Patient tolerated treatment well Patient left: in chair;with call bell/phone within reach Nurse Communication: Mobility status PT Visit Diagnosis: Difficulty in walking, not elsewhere classified (R26.2);Muscle weakness (generalized) (M62.81);Unsteadiness on feet (R26.81)    Time: 8182-9937 PT Time Calculation (min) (ACUTE ONLY): 15 min   Charges:   PT Evaluation $PT Eval Low Complexity: 1 Low          [place] Pager [phone] Office [phone]   Casmere Hollenbeck 07/27/2020, 3:37 PM

## 2020-07-27 NOTE — Plan of Care (Signed)
  Problem: Education: Goal: Knowledge of General Education information will improve Description Including pain rating scale, medication(s)/side effects and non-pharmacologic comfort measures Outcome: Progressing   Problem: Activity: Goal: Risk for activity intolerance will decrease Outcome: Progressing   Problem: Safety: Goal: Ability to remain free from injury will improve Outcome: Progressing   

## 2020-07-27 NOTE — ED Notes (Signed)
Patient is resting comfortably. Vitals stable. Diet soda given to pt per pt request.

## 2020-07-27 NOTE — H&P (Signed)
History and Physical  Patient Name: Janet Choi     FTD:[id number]    DOB: 22-Jul-1940    DOA: 07/26/2020 PCP: [name], MD  Patient coming from: Home  Chief Complaint: Dyspnea   HPI: Janet Choi is a 80 y.o. female, with PMH of AML, HFpEF, chronic hypoxic respiratory failure on 2 L nasal cannula, COPD, OSA, CAD, insulin-dependent diabetes, CKD 3 who presented to the ER on 07/26/2020 with with worsening dyspnea.  Patient states that over the past week or so she has had progressive worsening of dyspnea, causing difficulty with activities she can typically do.  She says she has been compliant with her home inhaler regimen.  She is taken her rescue inhaler with no significant improvement.  She noted a 15 pound weight gain over the past 2 months.  She was recently seen by her PCP, and she was advised to start taking Lasix 40 mg daily schedule.  Previously she was on as needed Lasix.  She was also diagnosed with UTI and started on Macrobid yesterday.  However the past 24 hours her subjective dyspnea has worsened.  She had to increase her supplemental O2 to maintain her O2 sat.  She called her pulmonologist on call yesterday and she was advised to go to the ER if her breathing worsens.  Despite her breathing improving slightly at the time of the call, it worsened this afternoon and she called EMS.  On EMS arrival, patient was satting 85% on 2 L.  She was placed on nonrebreather with improvement in sats and brought to the ED.    ED course: -Vitals on admission: Afebrile, heart rate 85, normotensive, respiratory rate 21, maintaining sats on nasal cannula.  On arrival by EMS, patient was satting 85% on 2 L -Labs on initial presentation: Sodium 139, potassium 3.9, chloride 103, bicarb 27, glucose 292, BUN 28, creatinine 1.2, WBC 1.5, hemoglobin 7.1, Covid negative, BNP 237 -Imaging obtained on admission: This did not demonstrate a PE, showed findings concerning for bronchitis -In the ED the  patient was given duo nebs, Lasix, Percocet, and the hospitalist service was contacted for further evaluation and management.     ROS: A complete and thorough 12 point review of systems obtained, negative listed in HPI.     Past Medical History:  Diagnosis Date  . Anemia    hx (04/14/2016)  . Anxiety   . Arthritis    "severe in my back; hands; ankles" (04/14/2016)  . Atypical chest pain 01/24/2018   Midline pain absent supine "constant" daytime since 12/31/17 > resolved as of 02/20/2018 on gerd/ gas diet   . Basal cell carcinoma    "several burned off; one cut off"  . CAD in native artery    a. NSTEMI 04/2016 - s/p DES toLAD and LCx. PCI to LCx notable for microembolization during cath.  . Chest pain- reslved with stopping Brilinta now on Plaix 04/16/2016  . Chronic lower back pain   . CKD (chronic kidney disease), stage III (HCC)    stage 3  . Complication of anesthesia   . COPD (chronic obstructive pulmonary disease) ([place])   . Depression   . Diverticulosis   . DJD (degenerative joint disease)   . Family history of adverse reaction to anesthesia   . Gallstones   . GERD (gastroesophageal reflux disease)   . History of gout   . History of kidney stones   . Hyperlipidemia   . Hypertension   . IBS (irritable bowel syndrome)   .  Ischemic cardiomyopathy    a. 04/2016: EF 40-50% by cath, 50-55% +WMA by echo.  . 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 Morbid obesity ([place])   . NSTEMI (non-ST elevated myocardial infarction) ([place]) 04/14/2016  . Obstructive sleep apnea of adult    appt for cpap 8-8 to adjust settings no cpap at this time  . On supplemental oxygen therapy    uses at bedtime or exposed to heat, 2Liters  . Peripheral neuropathy    feet and toes  . Peripheral vascular disease ([place])   . RLS (restless legs syndrome)   . Spinal stenosis   . Type II diabetes mellitus ([place])   . Vitamin D deficiency   . Walking pneumonia   . Wears partial dentures     lower    Past Surgical History:  Procedure Laterality Date  . APPENDECTOMY    . BASAL CELL CARCINOMA EXCISION Left    leg  . BIOPSY  12/25/2019   Procedure: BIOPSY;  Surgeon: Lavena Bullion, DO;  Location: [facility];  Service: Gastroenterology;;  EGD and COLON  . CARDIAC CATHETERIZATION N/A 04/15/2016   Procedure: Left Heart Cath and Coronary Angiography;  Surgeon: Belva Crome, MD;  Location: [facility];  Service: Cardiovascular;  Laterality: N/A;  . CARDIAC CATHETERIZATION N/A 04/15/2016   Procedure: Coronary Stent Intervention;  Surgeon: Belva Crome, MD;  Location: [facility];  Service: Cardiovascular;  Laterality: N/A;  Mid LAD Mid CFX  . CARPAL TUNNEL RELEASE Right    with trigger finger release  . CATARACT EXTRACTION, BILATERAL Bilateral   . COLONOSCOPY WITH PROPOFOL N/A 12/25/2019   Procedure: COLONOSCOPY WITH PROPOFOL;  Surgeon: Lavena Bullion, DO;  Location: [facility];  Service: Gastroenterology;  Laterality: N/A;  . DEBRIDEMENT TENNIS ELBOW Right   . ESOPHAGOGASTRODUODENOSCOPY (EGD) WITH PROPOFOL N/A 12/25/2019   Procedure: ESOPHAGOGASTRODUODENOSCOPY (EGD) WITH PROPOFOL;  Surgeon: Lavena Bullion, DO;  Location: [facility];  Service: Gastroenterology;  Laterality: N/A;  . LUMBAR DISC SURGERY  X 2  . MELANOMA EXCISION Left    "towards the back of my neck"  . POLYPECTOMY  12/25/2019   Procedure: POLYPECTOMY;  Surgeon: Lavena Bullion, DO;  Location: [facility];  Service: Gastroenterology;;  . SHOULDER ARTHROSCOPY W/ ROTATOR CUFF REPAIR Left   . SHOULDER OPEN ROTATOR CUFF REPAIR Right   . TOTAL KNEE ARTHROPLASTY Bilateral     Social History: Patient lives at home.  The patient walks with walker.  Non current smoker.  Allergies  Allergen Reactions  . Brilinta [Ticagrelor] Shortness Of Breath and Other (See Comments)    Chest pain (also)  . Aspirin Other (See Comments)    Can tolerate in small doses (is already on a blood thinner AND  has kidney disease)  . Nsaids Other (See Comments)    Patient is taking a blood thinner and has kidney disease  . Minocycline Hcl Other (See Comments)    Welts  . Oruvail [Ketoprofen] Other (See Comments)    Has kidney disease and is taking a blood thinner  . Tricor [Fenofibrate]     Reaction unknown  . Vasotec [Enalaprilat]     Reaction unknown  . Zinc     Reaction unknown    Family history: family history includes AAA (abdominal aortic aneurysm) in her father; Alzheimer's disease in her sister; Cancer in her brother and sister; Heart attack in her mother; Heart disease in her mother and son; Kidney disease  in her father; Parkinson's disease in her father.  Prior to Admission medications   Medication Sig Start Date End Date Taking? Authorizing Provider  Continuous Blood Gluc Receiver (FREESTYLE LIBRE 14 DAY READER) DEVI 1 Device by Does not apply route every 14 (fourteen) days. 09/07/19  Yes Quentin Mulling R, PA-C  Continuous Blood Gluc Sensor (FREESTYLE LIBRE 14 DAY SENSOR) MISC Apply 1 application topically every 14 (fourteen) days. 09/07/19  Yes Doree Albee, PA-C  acetaminophen (TYLENOL) 325 MG tablet Take 2 tablets (650 mg total) by mouth every 4 (four) hours as needed for headache or mild pain. 04/20/16   Leone Brand, NP  albuterol (VENTOLIN HFA) 108 (90 Base) MCG/ACT inhaler Inhale 2 puffs into the lungs every 4 (four) hours as needed for wheezing or shortness of breath. Patient not taking: No sig reported 09/07/19   Doree Albee, PA-C  Ascorbic Acid (VITAMIN C) 1000 MG tablet Take 1,000 mg by mouth daily.    [provider]  aspirin EC 81 MG EC tablet Take 1 tablet (81 mg total) by mouth daily. 04/21/16   Leone Brand, NP  atorvastatin (LIPITOR) 80 MG tablet Take 1 tablet by mouth every evening. 07/21/20   [provider]  BD PEN NEEDLE NANO 2ND GEN 32G X 4 MM MISC USE AS DIRECTED 4 TIMES A DAY 06/06/20   Lucky Cowboy, MD   Budeson-Glycopyrrol-Formoterol (BREZTRI AEROSPHERE) 160-9-4.8 MCG/ACT AERO Inhale 2 puffs into the lungs in the morning and at bedtime. Patient not taking: No sig reported 10/15/19   Leslye Peer, MD  buPROPion (WELLBUTRIN XL) 300 MG 24 hr tablet TAKE 1 TABLET (300 MG TOTAL) BY MOUTH DAILY FOR MOOD Patient not taking: Reported on 07/27/2020 03/25/20   Judd Gaudier, NP  Cholecalciferol 125 MCG (5000 UT) capsule Take 5,000 Units by mouth 2 (two) times daily.     [provider]  diclofenac sodium (VOLTAREN) 1 % GEL Apply 4 g topically 4 (four) times daily. Patient taking differently: Apply 4 g topically 4 (four) times daily as needed (pain). 01/18/18   Doree Albee, PA-C  escitalopram (LEXAPRO) 20 MG tablet Take 1 tablet (20 mg total) by mouth daily. 01/08/20 01/07/21  Elder Negus, NP  ferrous sulfate 325 (65 FE) MG tablet Take 325 mg by mouth 2 (two) times daily with a meal.     [provider]  fluconazole (DIFLUCAN) 100 MG tablet Take      1 tablet     Daily       for Oral Thrush (Yeast) Infection Patient not taking: Reported on 07/27/2020 05/05/20   Lucky Cowboy, MD  furosemide (LASIX) 40 MG tablet Take 40 mg by mouth daily as needed for fluid.     [provider]  gabapentin (NEURONTIN) 100 MG capsule TAKE 1 TO 2 CAPSULES 3 TO 4 X /DAY AS NEEDED FOR PAINFUL DIABETIC NEUROPATHY 05/26/20   Lucky Cowboy, MD  Insulin Glargine [facility]) 100 UNIT/ML INJECT 70 UNITS DAILY FOR DIABETES 04/29/20   Judd Gaudier, NP  ketoconazole (NIZORAL) 2 % cream APPLY TO AFFECTED AREA TWICE A DAY Patient taking differently: Apply 1 application topically daily as needed (itching). 11/16/19   Lucky Cowboy, MD  Lancets Letta Pate ULTRASOFT) lancets Use as instructed 04/27/16   Carlus Pavlov, MD  liraglutide (VICTOZA) 18 MG/3ML SOPN Inject 1.2 mg into the skin every morning. 07/22/20   Elder Negus, NP  metoprolol succinate (TOPROL-XL) 25 MG 24 hr tablet Take  1 tablet (25  mg total) by mouth at bedtime. 07/25/19   [facility], Tami Lin, PA  nitrofurantoin, macrocrystal-monohydrate, (MACROBID) 100 MG capsule Take 1 capsule (100 mg total) by mouth 2 (two) times daily. 07/25/20   Garnet Sierras, NP  nitroGLYCERIN (NITROSTAT) 0.4 MG SL tablet Place 1 tablet (0.4 mg total) under the tongue every 5 (five) minutes x 3 doses as needed for chest pain. 10/04/17   [name], MD  NOVOLOG FLEXPEN 100 UNIT/ML FlexPen INJECT 50 TO 75 UNITS 3 X /DAY BEFORE MEALS AS DIRECTED FOR DIABETES Patient not taking: Reported on 07/27/2020 11/16/19   [name], MD  nystatin (MYCOSTATIN) 100000 UNIT/ML suspension 5 ml four times a day, retain in mouth as long as possible (Swish and Swallow).  Use for 48 hours after symptoms resolve. 07/23/20   Garnet Sierras, NP  ONETOUCH VERIO test strip USE AS INSTRUCTED TO CHECK SUGAR 2 TIMES DAILY. 09/21/17   Philemon Kingdom, MD  pantoprazole (PROTONIX) 40 MG tablet Take 1 tablet (40 mg total) by mouth 2 (two) times daily as needed. For acid reflux. 06/12/20 06/12/21  Liane Comber, NP  Polyethylene Glycol 400 (BLINK TEARS OP) Place 1 drop into both eyes daily as needed (dry eyes).     [provider]  rOPINIRole (REQUIP) 3 MG tablet TAKE 1 TABLET 4 TIMES A DAY FOR RESTLESS LEGS 03/25/20   Liane Comber, NP  tiZANidine (ZANAFLEX) 4 MG tablet Take 4 mg by mouth at bedtime. 07/25/20   [provider]  triamcinolone (NASACORT) 55 MCG/ACT AERO nasal inhaler Place 2 sprays into the nose at bedtime. 04/16/20 04/16/21  Garnet Sierras, NP  vitamin B-12 (CYANOCOBALAMIN) 1000 MCG tablet Take 1,000 mcg by mouth daily.    [provider]  vitamin E 180 MG (400 UNITS) capsule Take 400 Units by mouth daily.    [provider]       Physical Exam: BP (!) 143/63   Pulse 76   Temp 99.3 F (37.4 C) (Oral)   Resp (!) 22   Ht $R'5\' 1"'EG$  (1.549 m)   Wt 117.5 kg   SpO2 95%   BMI 48.95 kg/m   General appearance:  Well-developed, adult female, alert and in no acute distress . Eyes: Anicteric, conjunctiva pink, lids and lashes normal. PERRL.    ENT: No nasal deformity, discharge, epistaxis.  Hearing intact. OP moist without lesions.   Neck: No neck masses.  Trachea midline.  No thyromegaly/tenderness. Lymph: No cervical or supraclavicular lymphadenopathy. Skin: Warm and dry.  No jaundice.  No suspicious rashes or lesions. Cardiac: RRR, nl S1-S2, no murmurs appreciated.    1+ LE edema.  Radial and pedal pulses 2+ and symmetric. Respiratory:  Diminished breath sounds bilaterally, coarse, no significant wheezing noted, some dyspnea with talking Abdomen: Abdomen soft.  No tenderness with palpation. No ascites, distension, hepatosplenomegaly.   MSK: No deformities or effusions of the large joints of the upper or lower extremities bilaterally.  No cyanosis or clubbing. Neuro: Cranial nerves 2 through 12 grossly intact.  Sensation intact to light touch. Speech is fluent.   Psych: Sensorium intact and responding to questions, attention normal.  Behavior appropriate.  Judgment and insight appear normal.    Labs on Admission:  I have personally reviewed following labs and imaging studies: CBC: Recent Labs  Lab 07/23/20 1538 07/26/20 2323  WBC 1.6* 1.5*  NEUTROABS 738* 1.0*  HGB 8.5* 7.1*  HCT 25.9* 23.3*  MCV 103.2* 111.5*  PLT 110* 96*   Basic Metabolic Panel: Recent Labs  Lab 07/23/20 1538 07/26/20 2323  NA 143 139  K 4.6 3.9  CL 106 103  CO2 30 27  GLUCOSE 102* 292*  BUN 28* 28*  CREATININE 1.29* 1.21*  CALCIUM 9.4 8.7*   GFR: Estimated Creatinine Clearance: 45.1 mL/min (A) (by C-G formula based on SCr of 1.21 mg/dL (H)).  Liver Function Tests: Recent Labs  Lab 07/23/20 1538 07/26/20 2323  AST 18 16  ALT 12 16  ALKPHOS  --  56  BILITOT 0.4 0.4  PROT 6.2 6.3*  ALBUMIN  --  3.5   No results for input(s): LIPASE, AMYLASE in the last 168 hours. No results for input(s): AMMONIA in  the last 168 hours. Coagulation Profile: No results for input(s): INR, PROTIME in the last 168 hours. Cardiac Enzymes: No results for input(s): CKTOTAL, CKMB, CKMBINDEX, TROPONINI in the last 168 hours. BNP (last 3 results) No results for input(s): PROBNP in the last 8760 hours. HbA1C: No results for input(s): HGBA1C in the last 72 hours. CBG: No results for input(s): GLUCAP in the last 168 hours. Lipid Profile: No results for input(s): CHOL, HDL, LDLCALC, TRIG, CHOLHDL, LDLDIRECT in the last 72 hours. Thyroid Function Tests: No results for input(s): TSH, T4TOTAL, FREET4, T3FREE, THYROIDAB in the last 72 hours. Anemia Panel: No results for input(s): VITAMINB12, FOLATE, FERRITIN, TIBC, IRON, RETICCTPCT in the last 72 hours.   Recent Results (from the past 240 hour(s))  Urine Culture     Status: Abnormal   Collection Time: 07/23/20  3:38 PM  Result Value Ref Range Status   MICRO NUMBER: [id number]  Final   SPECIMEN QUALITY: Adequate  Final   Sample Source URINE  Final   STATUS: FINAL  Final   ISOLATE 1: Escherichia coli (A)  Final    Comment: Greater than 100,000 CFU/mL of Escherichia coli      Susceptibility   Escherichia coli - URINE CULTURE, REFLEX    AMOX/CLAVULANIC >=32 Resistant     AMPICILLIN >=32 Resistant     AMPICILLIN/SULBACTAM >=32 Resistant     CEFAZOLIN* 16 Resistant      * For uncomplicated UTI caused by E. coli,K. pneumoniae or P. mirabilis: Cefazolin issusceptible if MIC <32 mcg/mL and predictssusceptible to the oral agents cefaclor, cefdinir,cefpodoxime, cefprozil, cefuroxime, cephalexinand loracarbef.    CEFEPIME <=1 Sensitive     CEFTRIAXONE <=1 Sensitive     CIPROFLOXACIN <=0.25 Sensitive     LEVOFLOXACIN <=0.12 Sensitive     ERTAPENEM <=0.5 Sensitive     GENTAMICIN <=1 Sensitive     IMIPENEM <=0.25 Sensitive     NITROFURANTOIN 32 Sensitive     PIP/TAZO <=4 Sensitive     TOBRAMYCIN <=1 Sensitive     TRIMETH/SULFA* <=20 Sensitive      * For uncomplicated  UTI caused by E. coli,K. pneumoniae or P. mirabilis: Cefazolin issusceptible if MIC <32 mcg/mL and predictssusceptible to the oral agents cefaclor, cefdinir,cefpodoxime, cefprozil, cefuroxime, cephalexinand loracarbef.Legend:S = Susceptible  I = IntermediateR = Resistant  NS = Not susceptible* = Not tested  NR = Not reported**NN = See antimicrobic comments  Resp Panel by RT-PCR (Flu A&B, Covid) Nasopharyngeal Swab     Status: None   Collection Time: 07/27/20  2:55 AM   Specimen: Nasopharyngeal Swab; Nasopharyngeal(NP) swabs in vial transport medium  Result Value Ref Range Status   SARS Coronavirus 2 by RT PCR NEGATIVE NEGATIVE Final    Comment: (NOTE) SARS-CoV-2 target nucleic acids are NOT DETECTED.  The SARS-CoV-2 RNA is generally detectable in upper  respiratory specimens during the acute phase of infection. The lowest concentration of SARS-CoV-2 viral copies this assay can detect is 138 copies/mL. A negative result does not preclude SARS-Cov-2 infection and should not be used as the sole basis for treatment or other patient management decisions. A negative result may occur with  improper specimen collection/handling, submission of specimen other than nasopharyngeal swab, presence of viral mutation(s) within the areas targeted by this assay, and inadequate number of viral copies(<138 copies/mL). A negative result must be combined with clinical observations, patient history, and epidemiological information. The expected result is Negative.  Fact Sheet for Patients:  EntrepreneurPulse.com.au  Fact Sheet for Healthcare Providers:  IncredibleEmployment.be  This test is no t yet approved or cleared by the [place] FDA and  has been authorized for detection and/or diagnosis of SARS-CoV-2 by FDA under an Emergency Use Authorization (EUA). This EUA will remain  in effect (meaning this test can be used) for the duration of the COVID-19 declaration  under Section 564(b)(1) of the Act, 21 U.S.C.section 360bbb-3(b)(1), unless the authorization is terminated  or revoked sooner.       Influenza A by PCR NEGATIVE NEGATIVE Final   Influenza B by PCR NEGATIVE NEGATIVE Final    Comment: (NOTE) The Xpert Xpress SARS-CoV-2/FLU/RSV plus assay is intended as an aid in the diagnosis of influenza from Nasopharyngeal swab specimens and should not be used as a sole basis for treatment. Nasal washings and aspirates are unacceptable for Xpert Xpress SARS-CoV-2/FLU/RSV testing.  Fact Sheet for Patients: EntrepreneurPulse.com.au  Fact Sheet for Healthcare Providers: IncredibleEmployment.be  This test is not yet approved or cleared by the [place] FDA and has been authorized for detection and/or diagnosis of SARS-CoV-2 by FDA under an Emergency Use Authorization (EUA). This EUA will remain in effect (meaning this test can be used) for the duration of the COVID-19 declaration under Section 564(b)(1) of the Act, 21 U.S.C. section 360bbb-3(b)(1), unless the authorization is terminated or revoked.  Performed at [facility], [place] [address] [place], [place] [address]            Radiological Exams on Admission: Personally reviewed imaging which shows: CTA of the chest did not demonstrate PE but did show findings of bronchitis DG Chest 2 View  Result Date: 07/26/2020 CLINICAL DATA:  Shortness of breath. EXAM: CHEST - 2 VIEW COMPARISON:  Chest x-ray 12/26/2019, CT chest 02/18/2020 FINDINGS: The heart size and mediastinal contours are unchanged. Prominence of the hilar vasculature. Aortic arch calcifications. No focal consolidation. No pulmonary edema. Trace bilateral pleural effusions. No pneumothorax. No acute osseous abnormality. IMPRESSION: Pulmonary edema with bilateral trace pleural effusions. Electronically Signed   By: Iven Finn M.D.   On: 07/26/2020 23:21   CT Angio Chest PE  W and/or Wo Contrast  Result Date: 07/27/2020 CLINICAL DATA:  Shortness of breath for 1 week with productive cough, newly diagnosed leukemia EXAM: CT ANGIOGRAPHY CHEST WITH CONTRAST TECHNIQUE: Multidetector CT imaging of the chest was performed using the standard protocol during bolus administration of intravenous contrast. Multiplanar CT image reconstructions and MIPs were obtained to evaluate the vascular anatomy. CONTRAST:  173mL OMNIPAQUE IOHEXOL 350 MG/ML SOLN COMPARISON:  CT 02/18/2020, 08/28/2019 FINDINGS: Cardiovascular: Central pulmonary arterial contrast bolus is satisfactory but with poor distal opacification further complicated by respiratory motion artifact. No large central or lobar filling defects are identified though more distal assessment is limited by these technical factors. Central pulmonary arteries are normal caliber. Normal heart size. No pericardial effusion.  Coronary artery calcifications and coronary stenting is present. The aortic root is suboptimally assessed given cardiac pulsation artifact. Atherosclerotic plaque within the normal caliber aorta. No acute luminal abnormality of the imaged aorta. No periaortic stranding or hemorrhage. Shared origin of the brachiocephalic and left common carotid arteries. Calcifications proximal great vessels. No major venous abnormalities. Mediastinum/Nodes: No mediastinal fluid or gas. Normal thyroid gland and thoracic inlet. No acute abnormality of the trachea or esophagus. Few scattered subcentimeter lymph nodes again seen throughout the mediastinum and hila. No new enlarged or enlarging adenopathy is seen. Lungs/Pleura: Evaluation lung parenchyma limited by respiratory motion artifact. Diffuse mild airways thickening and scattered secretions. Dependent regions of basilar atelectasis. Underlying airspace disease is difficult to fully exclude. No visible effusion or pneumothorax. Stable 6 mm mm basilar nodule in the posterior left lower lobe (5/98).  Small left basilar Bochdalek's hernia. Upper Abdomen: Small sliding-type hiatal hernia. No acute abnormalities present in the visualized portions of the upper abdomen. Musculoskeletal: Multilevel degenerative changes are present in the imaged portions of the spine. Dextrocurvature of the midthoracic spine. No acute osseous abnormality or suspicious osseous lesion. Review of the MIP images confirms the above findings. IMPRESSION: 1. Imaging quality is significantly degraded by respiratory motion artifact. 2. No large central or lobar filling defects are identified though more distal assessment is limited by technical factors. 3. Diffuse mild airways thickening and scattered secretions, could suggest an acute or chronic bronchitis/bronchiolitis. Some additional volume loss noted in the lung bases. Underlying airspace disease is difficult to fully exclude. 4. Stable 6 mm basilar nodule in the posterior left lower lobe. May be post infectious or inflammatory given continued stability. Could consider additional 12 month follow-up CT. Please note typical Fleischner Society guidelines do not apply to this patient with known malignancy. 5. No new enlarged or enlarging adenopathy in the chest or upper abdomen. 6. Small sliding-type hiatal hernia. 7. Aortic Atherosclerosis (ICD10-I70.0). Electronically Signed   By: Lovena Le M.D.   On: 07/27/2020 04:13     Assessment/Plan   1.  Acute on chronic hypoxic respiratory failure -Worsening hypoxia and respiratory distress.  Likely secondary to bronchitis, volume overload, with underlying COPD -CTA of the chest did not demonstrate PE but did show findings concerning for bronchitis -Scheduled and as needed breathing treatments -We will continue on Lasix 40 mg IV daily -Started on Zithromax for anti-inflammatory properties and bronchitis -Wean O2 as tolerated  2.  COPD with bronchitis -See further plans above  3.  Diastolic CHF -Mild generalized edema.  Patient  admits to recent weight gain -Echo in August 2021 demonstrated grade 2 diastolic dysfunction, preserved EF -At home was on Lasix as needed but recently advised to take daily -Started on Lasix 40 mg IV daily -Strict I's and O's, daily weights  4.  Anemia -Likely related to leukemia, no signs of acute blood loss per patient -Admission hemoglobin 7.1 -Follow-up labs ordered, will continue close monitoring.  No indication for transfusion at this time  5.  Uncomplicated UTI -Patient had urine culture positive for E. coli on 3/16, and was just started on Macrobid and only has taken for 1 day -We will discontinue Macrobid given her CKD -Plan to give one-time dose of fosfomycin to complete treatment for uncomplicated cystitis  6.  Leukemia -Followed by oncology.  No plans for treatment as patient not a candidate for bone marrow biopsy  7.  Type 2 diabetes -Hemoglobin A1c 7.9 on 07/23/2020 -Glucose checks, sliding scale -Plan to restart home  insulin regiment once medications reconciled     DVT prophylaxis: SCDs given anemia Code Status: DNR Family Communication: None Disposition Plan: Anticipate discharge home when medically optimized Consults called: None Admission status: Observation   At the point of initial evaluation, it is my clinical opinion that admission for OBSERVATION is reasonable and necessary because the patient's presenting complaints in the context of their chronic conditions represent sufficient risk of deterioration or significant morbidity to constitute reasonable grounds for close observation in the hospital setting, but that the patient may be medically stable for discharge from the hospital within 24 to 48 hours.    Medical decision making: Patient seen at 5:08 AM on 07/27/2020.  The patient was discussed with ER provider.  What exists of the patient's chart was reviewed in depth and summarized above.  Clinical condition: Fair.        Doran Heater [facility] Please page though [facility] or Epic secure chat:  For password, contact charge nurse

## 2020-07-27 NOTE — ED Notes (Signed)
Provider aware of hgb level 7.1.  Pt in no acute distress, no signs of bleeding or distress noted.  Pt resting comfortably in bed, denies pain at this time.

## 2020-07-27 NOTE — ED Notes (Signed)
Patient is resting comfortably. 

## 2020-07-28 DIAGNOSIS — N1832 Chronic kidney disease, stage 3b: Secondary | ICD-10-CM

## 2020-07-28 DIAGNOSIS — D61818 Other pancytopenia: Secondary | ICD-10-CM

## 2020-07-28 DIAGNOSIS — I5032 Chronic diastolic (congestive) heart failure: Secondary | ICD-10-CM

## 2020-07-28 LAB — CBC WITH DIFFERENTIAL/PLATELET
Abs Immature Granulocytes: 0 10*3/uL (ref 0.00–0.07)
Basophils Absolute: 0 10*3/uL (ref 0.0–0.1)
Basophils Relative: 1 %
Eosinophils Absolute: 0.1 10*3/uL (ref 0.0–0.5)
Eosinophils Relative: 4 %
HCT: 24.4 % — ABNORMAL LOW (ref 36.0–46.0)
Hemoglobin: 7.4 g/dL — ABNORMAL LOW (ref 12.0–15.0)
Immature Granulocytes: 0 %
Lymphocytes Relative: 29 %
Lymphs Abs: 0.4 10*3/uL — ABNORMAL LOW (ref 0.7–4.0)
MCH: 33.3 pg (ref 26.0–34.0)
MCHC: 30.3 g/dL (ref 30.0–36.0)
MCV: 109.9 fL — ABNORMAL HIGH (ref 80.0–100.0)
Monocytes Absolute: 0.1 10*3/uL (ref 0.1–1.0)
Monocytes Relative: 4 %
Neutro Abs: 0.9 10*3/uL — ABNORMAL LOW (ref 1.7–7.7)
Neutrophils Relative %: 62 %
Platelets: 109 10*3/uL — ABNORMAL LOW (ref 150–400)
RBC: 2.22 MIL/uL — ABNORMAL LOW (ref 3.87–5.11)
RDW: 16.5 % — ABNORMAL HIGH (ref 11.5–15.5)
WBC: 1.5 10*3/uL — ABNORMAL LOW (ref 4.0–10.5)
nRBC: 0 % (ref 0.0–0.2)

## 2020-07-28 LAB — GLUCOSE, CAPILLARY
Glucose-Capillary: 202 mg/dL — ABNORMAL HIGH (ref 70–99)
Glucose-Capillary: 275 mg/dL — ABNORMAL HIGH (ref 70–99)
Glucose-Capillary: 246 mg/dL — ABNORMAL HIGH (ref 70–99)
Glucose-Capillary: 154 mg/dL — ABNORMAL HIGH (ref 70–99)
Glucose-Capillary: 225 mg/dL — ABNORMAL HIGH (ref 70–99)

## 2020-07-28 LAB — BASIC METABOLIC PANEL
Anion gap: 11 (ref 5–15)
BUN: 32 mg/dL — ABNORMAL HIGH (ref 8–23)
CO2: 29 mmol/L (ref 22–32)
Calcium: 8.7 mg/dL — ABNORMAL LOW (ref 8.9–10.3)
Chloride: 98 mmol/L (ref 98–111)
Creatinine, Ser: 1.83 mg/dL — ABNORMAL HIGH (ref 0.44–1.00)
GFR, Estimated: 28 mL/min — ABNORMAL LOW (ref >60)
Glucose, Bld: 297 mg/dL — ABNORMAL HIGH (ref 70–99)
Potassium: 3.9 mmol/L (ref 3.5–5.1)
Sodium: 138 mmol/L (ref 135–145)

## 2020-07-28 MED ORDER — INSULIN ASPART 100 UNIT/ML ~~LOC~~ SOLN
0.0000 [IU] | Freq: Three times a day (TID) | SUBCUTANEOUS | Status: DC
  Administered 2020-07-28: 3 [IU] via SUBCUTANEOUS
  Administered 2020-07-28: 5 [IU] via SUBCUTANEOUS
  Administered 2020-07-28: 8 [IU] via SUBCUTANEOUS
  Administered 2020-07-29: 2 [IU] via SUBCUTANEOUS
  Administered 2020-07-29: 5 [IU] via SUBCUTANEOUS
  Administered 2020-07-29: 8 [IU] via SUBCUTANEOUS
  Administered 2020-07-30: 3 [IU] via SUBCUTANEOUS
  Administered 2020-07-30: 8 [IU] via SUBCUTANEOUS
  Administered 2020-07-30: 15 [IU] via SUBCUTANEOUS
  Administered 2020-07-31: 11 [IU] via SUBCUTANEOUS
  Administered 2020-07-31: 8 [IU] via SUBCUTANEOUS

## 2020-07-28 MED ORDER — INSULIN GLARGINE 100 UNIT/ML ~~LOC~~ SOLN
40.0000 [IU] | Freq: Every day | SUBCUTANEOUS | Status: DC
  Administered 2020-07-28: 40 [IU] via SUBCUTANEOUS
  Filled 2020-07-28: qty 0.4

## 2020-07-28 MED ORDER — INSULIN GLARGINE 100 UNIT/ML ~~LOC~~ SOLN
50.0000 [IU] | Freq: Every day | SUBCUTANEOUS | Status: DC
  Administered 2020-07-29: 50 [IU] via SUBCUTANEOUS
  Filled 2020-07-28: qty 0.5

## 2020-07-28 NOTE — Plan of Care (Signed)
  Problem: Education: Goal: Knowledge of General Education information will improve Description: Including pain rating scale, medication(s)/side effects and non-pharmacologic comfort measures Outcome: Progressing   Problem: Clinical Measurements: Goal: Diagnostic test results will improve Outcome: Progressing Goal: Respiratory complications will improve Outcome: Progressing Goal: Cardiovascular complication will be avoided Outcome: Progressing   Problem: Activity: Goal: Risk for activity intolerance will decrease Outcome: Progressing   Problem: Nutrition: Goal: Adequate nutrition will be maintained Outcome: Progressing   Problem: Coping: Goal: Level of anxiety will decrease Outcome: Progressing   Problem: Pain Managment: Goal: General experience of comfort will improve Outcome: Progressing   Problem: Safety: Goal: Ability to remain free from injury will improve Outcome: Progressing

## 2020-07-28 NOTE — Progress Notes (Addendum)
PROGRESS NOTE  Janet Choi TFT:[id number] DOB: 1941/01/10 DOA: 07/26/2020 PCP: Janet Pinto, MD  HPI/Recap of past 24 hours: HPI from Dr Parke Poisson is a 80 y.o. female, with PMH of AML, HFpEF, chronic hypoxic respiratory failure on 2 L nasal cannula, COPD, OSA, CAD, insulin-dependent diabetes, CKD 3 who presented to the ER on 07/26/2020 with worsening dyspnea. Patient states that over the past week or so she has had progressive worsening of dyspnea. She says she has been compliant with her inhaler/rescue inhaler regimen with no significant improvement.  She noted a 15 pound weight gain over the past 2 months and recently had to increase her supplemental O2.  She was recently seen by her PCP, and she was advised to start taking Lasix 40 mg daily schedule.  Previously she was on as needed Lasix.  She was also recently diagnosed with UTI and started on Macrobid.  Due to worsening symptoms, EMS was activated. On EMS arrival, patient was satting 85% on 2 L.  She was placed on nonrebreather with improvement in sats and brought to the ED. in the ED, saturation was noted to be 85% on 2 L of O2, other vital signs remained stable.  Labs showed hyperglycemia, leukopenia.  CTA chest negative for PE but did show bronchitis.  Patient was admitted for further management.    Subjective Today, patient reports feeling dizzy, denies any other new complaints, denies any chest pain, worsening shortness of breath, abdominal pain, nausea/vomiting, fever/chills.  Reported good night sleep.    Assessment/Plan: Active Problems:   Acute on chronic respiratory failure (HCC)   Acute on chronic hypoxic respiratory failure Currently requiring about 2 L of O2 Likely 2/2 COPD with bronchitis versus volume overload CTA chest negative for PE but showed signs of bronchitis Continue Zithromax, IV ceftriaxone to cover for any pneumonia Continue duo nebs, inhalers Supplemental oxygen as needed  Acute on  chronic diastolic HF Noted significant weight gain BNP 237, could be falsely low in obese patients Noted pulmonary edema on chest x-ray Troponin with flat trend, EKG showed no acute ST changes Echo done in 12/2019 showed grade 2 diastolic dysfunction, with preserved EF Hold Lasix due to rise in creatinine Strict I's and O's, daily weights  CKD stage IIIb Mild bump in creatinine Hold Lasix Daily BMP  History of CAD Currently chest pain-free Continue aspirin, metoprolol, Lipitor  COPD with possible bronchitis Management as above  E. coli UTI Urine culture grew E. coli Continue IV ceftriaxone  History of chronic pancytopenia/macrocytic anemia History of AML Monitor CBC closely  Diabetes mellitus type 2, uncontrolled with hyperglycemia History of neuropathy A1c 7.9 on 07/23/2020 SSI, Accu-Cheks, hypoglycemic protocol, insulin glargine Continue gabapentin  OSA Not on CPAP Continue supplemental oxygen  Restless leg/sciatica Continue ropinirole, Zanaflex  Morbid obesity Lifestyle modification advised  Goals of care discussion Patient with multiple comorbidities, poor prognosis Confirmed DNR Requested discussion with palliative team for further goals of care discussion      Estimated body mass index is 49.07 kg/m as calculated from the following:   Height as of this encounter: 5\' 1"  (1.549 m).   Weight as of this encounter: 117.8 kg.     Code Status: Full  Family Communication: Discussed with sister at bedside on 07/28/2020  Disposition Plan: Status is: Inpatient  The patient will require care spanning > 2 midnights and should be moved to inpatient because: Inpatient level of care appropriate due to severity of illness  Dispo: The patient  is from: Home              Anticipated d/c is to: Home              Patient currently is not medically stable to d/c.   Difficult to place patient  No    Consultants:  None  Procedures:  None  Antimicrobials:  Zithromax  Ceftriaxone  DVT prophylaxis: SCDs for now due to thrombocytopenia   Objective: Vitals:   07/28/20 0502 07/28/20 0817 07/28/20 1225 07/28/20 1321  BP: (!) 162/61  (!) 130/55   Pulse: 76  75   Resp: 20  18   Temp: 99.8 F (37.7 C)  99 F (37.2 C)   TempSrc: Oral     SpO2: 96% 97% 92% 99%  Weight:      Height:        Intake/Output Summary (Last 24 hours) at 07/28/2020 1408 Last data filed at 07/28/2020 0001 Gross per 24 hour  Intake 92.98 ml  Output 700 ml  Net -607.02 ml   Filed Weights   07/26/20 2235 07/27/20 0241 07/28/20 0500  Weight: 117.5 kg 117.5 kg 117.8 kg    Exam:  General: NAD   Cardiovascular: S1, S2 present  Respiratory:  Diminished breath sounds bilaterally  Abdomen: Soft, nontender, obese, nondistended, bowel sounds present  Musculoskeletal: Trace bilateral pedal edema noted  Skin:  Chronic venous stasis/multiple scabs noted on BLE  Psychiatry: Normal mood    Data Reviewed: CBC: Recent Labs  Lab 07/23/20 1538 07/26/20 2323 07/27/20 0955 07/28/20 0507  WBC 1.6* 1.5* 1.7* 1.5*  NEUTROABS 738* 1.0* 0.9* 0.9*  HGB 8.5* 7.1* 7.4* 7.4*  HCT 25.9* 23.3* 24.0* 24.4*  MCV 103.2* 111.5* 110.6* 109.9*  PLT 110* 96* 100* 161*   Basic Metabolic Panel: Recent Labs  Lab 07/23/20 1538 07/26/20 2323 07/27/20 0955 07/28/20 0507  NA 143 139 139 138  K 4.6 3.9 3.7 3.9  CL 106 103 100 98  CO2 30 27 28 29   GLUCOSE 102* 292* 201* 297*  BUN 28* 28* 27* 32*  CREATININE 1.29* 1.21* 1.34* 1.83*  CALCIUM 9.4 8.7* 8.8* 8.7*  MG  --   --  2.0  --   PHOS  --   --  3.8  --    GFR: Estimated Creatinine Clearance: 29.8 mL/min (A) (by C-G formula based on SCr of 1.83 mg/dL (H)). Liver Function Tests: Recent Labs  Lab 07/23/20 1538 07/26/20 2323  AST 18 16  ALT 12 16  ALKPHOS  --  56  BILITOT 0.4 0.4  PROT 6.2 6.3*  ALBUMIN  --  3.5   No results for  input(s): LIPASE, AMYLASE in the last 168 hours. No results for input(s): AMMONIA in the last 168 hours. Coagulation Profile: No results for input(s): INR, PROTIME in the last 168 hours. Cardiac Enzymes: No results for input(s): CKTOTAL, CKMB, CKMBINDEX, TROPONINI in the last 168 hours. BNP (last 3 results) No results for input(s): PROBNP in the last 8760 hours. HbA1C: No results for input(s): HGBA1C in the last 72 hours. CBG: Recent Labs  Lab 07/27/20 1225 07/27/20 1630 07/27/20 2114 07/28/20 0732 07/28/20 1132  GLUCAP 301* 259* 267* 275* 246*   Lipid Profile: No results for input(s): CHOL, HDL, LDLCALC, TRIG, CHOLHDL, LDLDIRECT in the last 72 hours. Thyroid Function Tests: No results for input(s): TSH, T4TOTAL, FREET4, T3FREE, THYROIDAB in the last 72 hours. Anemia Panel: No results for input(s): VITAMINB12, FOLATE, FERRITIN, TIBC, IRON, RETICCTPCT in the last 72  hours. Urine analysis:    Component Value Date/Time   COLORURINE DARK YELLOW 07/23/2020 1538   APPEARANCEUR CLEAR 07/23/2020 1538   LABSPEC 1.019 07/23/2020 1538   PHURINE < OR = 5.0 07/23/2020 1538   GLUCOSEU NEGATIVE 07/23/2020 1538   HGBUR TRACE (A) 07/23/2020 1538   BILIRUBINUR (A) 12/28/2019 1153    TEST NOT REPORTED DUE TO COLOR INTERFERENCE OF URINE PIGMENT   KETONESUR NEGATIVE 07/23/2020 1538   PROTEINUR 2+ (A) 07/23/2020 1538   UROBILINOGEN 0.2 08/27/2013 1427   NITRITE (A) 12/28/2019 1153    TEST NOT REPORTED DUE TO COLOR INTERFERENCE OF URINE PIGMENT   LEUKOCYTESUR (A) 12/28/2019 1153    TEST NOT REPORTED DUE TO COLOR INTERFERENCE OF URINE PIGMENT   Sepsis Labs: @LABRCNTIP (procalcitonin:4,lacticidven:4)  ) Recent Results (from the past 240 hour(s))  Urine Culture     Status: Abnormal   Collection Time: 07/23/20  3:38 PM  Result Value Ref Range Status   MICRO NUMBER: [id number]  Final   SPECIMEN QUALITY: Adequate  Final   Sample Source URINE  Final   STATUS: FINAL  Final   ISOLATE 1:  Escherichia coli (A)  Final    Comment: Greater than 100,000 CFU/mL of Escherichia coli      Susceptibility   Escherichia coli - URINE CULTURE, REFLEX    AMOX/CLAVULANIC >=32 Resistant     AMPICILLIN >=32 Resistant     AMPICILLIN/SULBACTAM >=32 Resistant     CEFAZOLIN* 16 Resistant      * For uncomplicated UTI caused by E. coli,K. pneumoniae or P. mirabilis: Cefazolin issusceptible if MIC <32 mcg/mL and predictssusceptible to the oral agents cefaclor, cefdinir,cefpodoxime, cefprozil, cefuroxime, cephalexinand loracarbef.    CEFEPIME <=1 Sensitive     CEFTRIAXONE <=1 Sensitive     CIPROFLOXACIN <=0.25 Sensitive     LEVOFLOXACIN <=0.12 Sensitive     ERTAPENEM <=0.5 Sensitive     GENTAMICIN <=1 Sensitive     IMIPENEM <=0.25 Sensitive     NITROFURANTOIN 32 Sensitive     PIP/TAZO <=4 Sensitive     TOBRAMYCIN <=1 Sensitive     TRIMETH/SULFA* <=20 Sensitive      * For uncomplicated UTI caused by E. coli,K. pneumoniae or P. mirabilis: Cefazolin issusceptible if MIC <32 mcg/mL and predictssusceptible to the oral agents cefaclor, cefdinir,cefpodoxime, cefprozil, cefuroxime, cephalexinand loracarbef.Legend:S = Susceptible  I = IntermediateR = Resistant  NS = Not susceptible* = Not tested  NR = Not reported**NN = See antimicrobic comments  Resp Panel by RT-PCR (Flu A&B, Covid) Nasopharyngeal Swab     Status: None   Collection Time: 07/27/20  2:55 AM   Specimen: Nasopharyngeal Swab; Nasopharyngeal(NP) swabs in vial transport medium  Result Value Ref Range Status   SARS Coronavirus 2 by RT PCR NEGATIVE NEGATIVE Final    Comment: (NOTE) SARS-CoV-2 target nucleic acids are NOT DETECTED.  The SARS-CoV-2 RNA is generally detectable in upper respiratory specimens during the acute phase of infection. The lowest concentration of SARS-CoV-2 viral copies this assay can detect is 138 copies/mL. A negative result does not preclude SARS-Cov-2 infection and should not be used as the sole basis for treatment  or other patient management decisions. A negative result may occur with  improper specimen collection/handling, submission of specimen other than nasopharyngeal swab, presence of viral mutation(s) within the areas targeted by this assay, and inadequate number of viral copies(<138 copies/mL). A negative result must be combined with clinical observations, patient history, and epidemiological information. The expected result is Negative.  Fact Sheet  for Patients:  EntrepreneurPulse.com.au  Fact Sheet for Healthcare Providers:  IncredibleEmployment.be  This test is no t yet approved or cleared by the [place] FDA and  has been authorized for detection and/or diagnosis of SARS-CoV-2 by FDA under an Emergency Use Authorization (EUA). This EUA will remain  in effect (meaning this test can be used) for the duration of the COVID-19 declaration under Section 564(b)(1) of the Act, 21 U.S.C.section 360bbb-3(b)(1), unless the authorization is terminated  or revoked sooner.       Influenza A by PCR NEGATIVE NEGATIVE Final   Influenza B by PCR NEGATIVE NEGATIVE Final    Comment: (NOTE) The Xpert Xpress SARS-CoV-2/FLU/RSV plus assay is intended as an aid in the diagnosis of influenza from Nasopharyngeal swab specimens and should not be used as a sole basis for treatment. Nasal washings and aspirates are unacceptable for Xpert Xpress SARS-CoV-2/FLU/RSV testing.  Fact Sheet for Patients: EntrepreneurPulse.com.au  Fact Sheet for Healthcare Providers: IncredibleEmployment.be  This test is not yet approved or cleared by the [place] FDA and has been authorized for detection and/or diagnosis of SARS-CoV-2 by FDA under an Emergency Use Authorization (EUA). This EUA will remain in effect (meaning this test can be used) for the duration of the COVID-19 declaration under Section 564(b)(1) of the Act, 21 U.S.C. section  360bbb-3(b)(1), unless the authorization is terminated or revoked.  Performed at [facility], [place] [address] [place], [place] [address]       Studies: No results found.  Scheduled Meds: . aspirin EC  81 mg Oral Daily  . atorvastatin  80 mg Oral QPM  . azithromycin  250 mg Oral Daily  . escitalopram  20 mg Oral Daily  . ferrous sulfate  325 mg Oral BID WC  . fluticasone furoate-vilanterol  1 puff Inhalation Daily  . gabapentin  200 mg Oral BID  . insulin aspart  0-15 Units Subcutaneous TID WC  . insulin aspart  0-5 Units Subcutaneous QHS  . insulin glargine  40 Units Subcutaneous Daily  . ipratropium-albuterol  3 mL Nebulization TID  . ketoconazole  1 application Topical BID  . metoprolol succinate  25 mg Oral QHS  . pantoprazole  40 mg Oral BID  . rOPINIRole  3 mg Oral TID    Continuous Infusions: . cefTRIAXone (ROCEPHIN)  IV 1 g (07/28/20 0929)     LOS: 1 day     Alma Friendly, MD [facility]  If 7PM-7AM, please contact night-coverage [email or web address] 07/28/2020, 2:08 PM

## 2020-07-28 NOTE — Progress Notes (Signed)
PT Cancellation Note  Patient Details Name: Janet Choi MRN: [id number] DOB: May 27, 1940   Cancelled Treatment:    Reason Eval/Treat Not Completed:  Attempted PT tx session-pt declined to participate on today. Will check back another day.    [facility]  Office: [phone] Pager: [phone]

## 2020-07-28 NOTE — Telephone Encounter (Signed)
Called to check on patient. Patient did end up going to the hospital and is currently there. She will call office for follow up once she is discharged.  Nothing further needed at this time.

## 2020-07-29 LAB — BASIC METABOLIC PANEL
Anion gap: 9 (ref 5–15)
BUN: 37 mg/dL — ABNORMAL HIGH (ref 8–23)
CO2: 28 mmol/L (ref 22–32)
Calcium: 8.9 mg/dL (ref 8.9–10.3)
Chloride: 104 mmol/L (ref 98–111)
Creatinine, Ser: 1.64 mg/dL — ABNORMAL HIGH (ref 0.44–1.00)
GFR, Estimated: 32 mL/min — ABNORMAL LOW (ref >60)
Glucose, Bld: 275 mg/dL — ABNORMAL HIGH (ref 70–99)
Potassium: 4.7 mmol/L (ref 3.5–5.1)
Sodium: 141 mmol/L (ref 135–145)

## 2020-07-29 LAB — CBC WITH DIFFERENTIAL/PLATELET
Abs Immature Granulocytes: 0 10*3/uL (ref 0.00–0.07)
Basophils Absolute: 0 10*3/uL (ref 0.0–0.1)
Basophils Relative: 0 %
Eosinophils Absolute: 0.1 10*3/uL (ref 0.0–0.5)
Eosinophils Relative: 3 %
HCT: 22.9 % — ABNORMAL LOW (ref 36.0–46.0)
Hemoglobin: 6.9 g/dL — CL (ref 12.0–15.0)
Immature Granulocytes: 0 %
Lymphocytes Relative: 22 %
Lymphs Abs: 0.3 10*3/uL — ABNORMAL LOW (ref 0.7–4.0)
MCH: 33.7 pg (ref 26.0–34.0)
MCHC: 30.1 g/dL (ref 30.0–36.0)
MCV: 111.7 fL — ABNORMAL HIGH (ref 80.0–100.0)
Monocytes Absolute: 0 10*3/uL — ABNORMAL LOW (ref 0.1–1.0)
Monocytes Relative: 3 %
Neutro Abs: 1 10*3/uL — ABNORMAL LOW (ref 1.7–7.7)
Neutrophils Relative %: 72 %
Platelets: 105 10*3/uL — ABNORMAL LOW (ref 150–400)
RBC: 2.05 MIL/uL — ABNORMAL LOW (ref 3.87–5.11)
RDW: 16.5 % — ABNORMAL HIGH (ref 11.5–15.5)
WBC: 1.5 10*3/uL — ABNORMAL LOW (ref 4.0–10.5)
nRBC: 0 % (ref 0.0–0.2)

## 2020-07-29 LAB — GLUCOSE, CAPILLARY
Glucose-Capillary: 210 mg/dL — ABNORMAL HIGH (ref 70–99)
Glucose-Capillary: 273 mg/dL — ABNORMAL HIGH (ref 70–99)
Glucose-Capillary: 175 mg/dL — ABNORMAL HIGH (ref 70–99)
Glucose-Capillary: 232 mg/dL — ABNORMAL HIGH (ref 70–99)

## 2020-07-29 LAB — HEMOGLOBIN AND HEMATOCRIT, BLOOD
HCT: 25.2 % — ABNORMAL LOW (ref 36.0–46.0)
Hemoglobin: 7.8 g/dL — ABNORMAL LOW (ref 12.0–15.0)

## 2020-07-29 LAB — PREPARE RBC (CROSSMATCH)

## 2020-07-29 MED ORDER — SODIUM CHLORIDE 0.9% IV SOLUTION: Freq: Once | INTRAVENOUS | Status: DC

## 2020-07-29 MED ORDER — INSULIN ASPART 100 UNIT/ML ~~LOC~~ SOLN
3.0000 [IU] | Freq: Three times a day (TID) | SUBCUTANEOUS | Status: DC
  Administered 2020-07-29 – 2020-07-31 (×6): 3 [IU] via SUBCUTANEOUS

## 2020-07-29 MED ORDER — INSULIN GLARGINE 100 UNIT/ML ~~LOC~~ SOLN
60.0000 [IU] | Freq: Every day | SUBCUTANEOUS | Status: DC
  Administered 2020-07-30 – 2020-07-31 (×2): 60 [IU] via SUBCUTANEOUS
  Filled 2020-07-29 (×2): qty 0.6

## 2020-07-29 NOTE — Plan of Care (Signed)
  Problem: Health Behavior/Discharge Planning: Goal: Ability to manage health-related needs will improve Outcome: Progressing   Problem: Clinical Measurements: Goal: Respiratory complications will improve Outcome: Progressing   Problem: Nutrition: Goal: Adequate nutrition will be maintained Outcome: Progressing   Problem: Coping: Goal: Level of anxiety will decrease Outcome: Progressing

## 2020-07-29 NOTE — Plan of Care (Signed)
  Problem: Education: Goal: Knowledge of General Education information will improve Description: Including pain rating scale, medication(s)/side effects and non-pharmacologic comfort measures Outcome: Progressing   Problem: Clinical Measurements: Goal: Ability to maintain clinical measurements within normal limits will improve Outcome: Progressing   Problem: Activity: Goal: Risk for activity intolerance will decrease Outcome: Progressing   Problem: Coping: Goal: Level of anxiety will decrease Outcome: Progressing   Problem: Elimination: Goal: Will not experience complications related to bowel motility Outcome: Progressing   Problem: Safety: Goal: Ability to remain free from injury will improve Outcome: Progressing

## 2020-07-29 NOTE — Progress Notes (Signed)
PT Cancellation Note  Patient Details Name: Janet Choi MRN: [id number] DOB: 11/10/1940   Cancelled Treatment:    Reason Eval/Treat Not Completed: Fatigue/lethargy limiting ability to participate Pt just back to bed with nursing.  RN reports pt to also get one unit of PRBCs.  Will check back as schedule permits.   LEMYRE,KATHrine E 07/29/2020, 11:01 AM Arlyce Dice, DPT [facility] Pager: [phone] Office: [phone]

## 2020-07-29 NOTE — Progress Notes (Signed)
PROGRESS NOTE  Janet Choi MPN:[id number] DOB: 06-07-1940 DOA: 07/26/2020 PCP: [name], MD  HPI/Recap of past 24 hours: HPI from Dr Parke Poisson is a 80 y.o. female, with PMH of AML, HFpEF, chronic hypoxic respiratory failure on 2 L nasal cannula, COPD, OSA, CAD, insulin-dependent diabetes, CKD 3 who presented to the ER on 07/26/2020 with worsening dyspnea. Patient states that over the past week or so she has had progressive worsening of dyspnea. She says she has been compliant with her inhaler/rescue inhaler regimen with no significant improvement.  She noted a 15 pound weight gain over the past 2 months and recently had to increase her supplemental O2.  She was recently seen by her PCP, and she was advised to start taking Lasix 40 mg daily schedule.  Previously she was on as needed Lasix.  She was also recently diagnosed with UTI and started on Macrobid.  Due to worsening symptoms, EMS was activated. On EMS arrival, patient was satting 85% on 2 L.  She was placed on nonrebreather with improvement in sats and brought to the ED. in the ED, saturation was noted to be 85% on 2 L of O2, other vital signs remained stable.  Labs showed hyperglycemia, leukopenia.  CTA chest negative for PE but did show bronchitis.  Patient was admitted for further management.    Subjective Today, patient reported feeling fatigued, denied any new complaints, denied any evidence of bleeding, chest pain, worsening shortness of breath, abdominal pain, nausea/vomiting, fever/chills    Assessment/Plan: Active Problems:   Acute on chronic respiratory failure (HCC)   Acute on chronic hypoxic respiratory failure Currently requiring about 2 L of O2 Likely 2/2 COPD with bronchitis versus volume overload CTA chest negative for PE but showed signs of bronchitis Continue Zithromax, IV ceftriaxone to cover for any pneumonia Continue duo nebs, inhalers Supplemental oxygen as needed  Acute on chronic  diastolic HF Noted significant weight gain BNP 237, could be falsely low in obese patients Noted pulmonary edema on chest x-ray Troponin with flat trend, EKG showed no acute ST changes Echo done in 12/2019 showed grade 2 diastolic dysfunction, with preserved EF Hold Lasix due to rise in creatinine Strict I's and O's, daily weights  CKD stage IIIb Mild bump in creatinine Hold Lasix Daily BMP  History of CAD Currently chest pain-free Continue aspirin, metoprolol, Lipitor  COPD with possible bronchitis Management as above  E. coli UTI Urine culture grew E. coli Continue IV ceftriaxone  History of chronic pancytopenia/macrocytic anemia History of AML Hgb drop to 6.9, transfused 1U of PRBC on 07/29/20 Daily CBC Added Dr [name] to the treatment team  Diabetes mellitus type 2, uncontrolled with hyperglycemia History of neuropathy A1c 7.9 on 07/23/2020 SSI, Accu-Cheks, hypoglycemic protocol, insulin glargine Continue gabapentin  OSA Not on CPAP Continue supplemental oxygen  Restless leg/sciatica Continue ropinirole, Zanaflex  Morbid obesity Lifestyle modification advised  Goals of care discussion Patient with multiple comorbidities, poor prognosis Confirmed DNR Requested discussion with palliative team for further goals of care discussion      Estimated body mass index is 49.4 kg/m as calculated from the following:   Height as of this encounter: 5\' 1"  (1.549 m).   Weight as of this encounter: 118.6 kg.     Code Status: Full  Family Communication: Discussed with sister at bedside on 07/28/2020  Disposition Plan: Status is: Inpatient  The patient will require care spanning > 2 midnights and should be moved to inpatient because: Inpatient  level of care appropriate due to severity of illness  Dispo: The patient is from: Home              Anticipated d/c is to: Home              Patient currently is not medically stable to d/c.   Difficult to place patient  No    Consultants:  Added Dr [name], oncologist to treatment team  Procedures:  None  Antimicrobials:  Zithromax  Ceftriaxone  DVT prophylaxis: SCDs for now due to thrombocytopenia   Objective: Vitals:   07/29/20 1115 07/29/20 1130 07/29/20 1450 07/29/20 1457  BP: (!) 144/54 127/77 (!) 144/68   Pulse: 80 79 78   Resp: (!) 22 (!) 22 (!) 22   Temp: 99 F (37.2 C) 98.6 F (37 C) 98.6 F (37 C)   TempSrc: Oral Oral Oral   SpO2: 92% 93% 91% (!) 3%  Weight:      Height:        Intake/Output Summary (Last 24 hours) at 07/29/2020 1708 Last data filed at 07/29/2020 1450 Gross per 24 hour  Intake 985.54 ml  Output -  Net 985.54 ml   Filed Weights   07/27/20 0241 07/28/20 0500 07/29/20 0536  Weight: 117.5 kg 117.8 kg 118.6 kg    Exam:  General: NAD   Cardiovascular: S1, S2 present  Respiratory:  Diminished breath sounds bilaterally  Abdomen: Soft, nontender, obese, nondistended, bowel sounds present  Musculoskeletal: No bilateral pedal edema noted  Skin:  Chronic venous stasis/multiple scabs noted on BLE  Psychiatry: Normal mood    Data Reviewed: CBC: Recent Labs  Lab 07/23/20 1538 07/26/20 2323 07/27/20 0955 07/28/20 0507 07/29/20 0556  WBC 1.6* 1.5* 1.7* 1.5* 1.5*  NEUTROABS 738* 1.0* 0.9* 0.9* 1.0*  HGB 8.5* 7.1* 7.4* 7.4* 6.9*  HCT 25.9* 23.3* 24.0* 24.4* 22.9*  MCV 103.2* 111.5* 110.6* 109.9* 111.7*  PLT 110* 96* 100* 109* 782*   Basic Metabolic Panel: Recent Labs  Lab 07/23/20 1538 07/26/20 2323 07/27/20 0955 07/28/20 0507 07/29/20 0556  NA 143 139 139 138 141  K 4.6 3.9 3.7 3.9 4.7  CL 106 103 100 98 104  CO2 30 27 28 29 28   GLUCOSE 102* 292* 201* 297* 275*  BUN 28* 28* 27* 32* 37*  CREATININE 1.29* 1.21* 1.34* 1.83* 1.64*  CALCIUM 9.4 8.7* 8.8* 8.7* 8.9  MG  --   --  2.0  --   --   PHOS  --   --  3.8  --   --    GFR: Estimated Creatinine Clearance: 33.4 mL/min (A) (by C-G formula based on SCr of 1.64 mg/dL (H)). Liver  Function Tests: Recent Labs  Lab 07/23/20 1538 07/26/20 2323  AST 18 16  ALT 12 16  ALKPHOS  --  56  BILITOT 0.4 0.4  PROT 6.2 6.3*  ALBUMIN  --  3.5   No results for input(s): LIPASE, AMYLASE in the last 168 hours. No results for input(s): AMMONIA in the last 168 hours. Coagulation Profile: No results for input(s): INR, PROTIME in the last 168 hours. Cardiac Enzymes: No results for input(s): CKTOTAL, CKMB, CKMBINDEX, TROPONINI in the last 168 hours. BNP (last 3 results) No results for input(s): PROBNP in the last 8760 hours. HbA1C: No results for input(s): HGBA1C in the last 72 hours. CBG: Recent Labs  Lab 07/28/20 1635 07/28/20 2104 07/29/20 0734 07/29/20 1133 07/29/20 1608  GLUCAP 154* 225* 210* 273* 175*   Lipid  Profile: No results for input(s): CHOL, HDL, LDLCALC, TRIG, CHOLHDL, LDLDIRECT in the last 72 hours. Thyroid Function Tests: No results for input(s): TSH, T4TOTAL, FREET4, T3FREE, THYROIDAB in the last 72 hours. Anemia Panel: No results for input(s): VITAMINB12, FOLATE, FERRITIN, TIBC, IRON, RETICCTPCT in the last 72 hours. Urine analysis:    Component Value Date/Time   COLORURINE DARK YELLOW 07/23/2020 1538   APPEARANCEUR CLEAR 07/23/2020 1538   LABSPEC 1.019 07/23/2020 1538   PHURINE < OR = 5.0 07/23/2020 1538   GLUCOSEU NEGATIVE 07/23/2020 1538   HGBUR TRACE (A) 07/23/2020 1538   BILIRUBINUR (A) 12/28/2019 1153    TEST NOT REPORTED DUE TO COLOR INTERFERENCE OF URINE PIGMENT   KETONESUR NEGATIVE 07/23/2020 1538   PROTEINUR 2+ (A) 07/23/2020 1538   UROBILINOGEN 0.2 08/27/2013 1427   NITRITE (A) 12/28/2019 1153    TEST NOT REPORTED DUE TO COLOR INTERFERENCE OF URINE PIGMENT   LEUKOCYTESUR (A) 12/28/2019 1153    TEST NOT REPORTED DUE TO COLOR INTERFERENCE OF URINE PIGMENT   Sepsis Labs: @LABRCNTIP (procalcitonin:4,lacticidven:4)  ) Recent Results (from the past 240 hour(s))  Urine Culture     Status: Abnormal   Collection Time: 07/23/20  3:38  PM  Result Value Ref Range Status   MICRO NUMBER: [id number]  Final   SPECIMEN QUALITY: Adequate  Final   Sample Source URINE  Final   STATUS: FINAL  Final   ISOLATE 1: Escherichia coli (A)  Final    Comment: Greater than 100,000 CFU/mL of Escherichia coli      Susceptibility   Escherichia coli - URINE CULTURE, REFLEX    AMOX/CLAVULANIC >=32 Resistant     AMPICILLIN >=32 Resistant     AMPICILLIN/SULBACTAM >=32 Resistant     CEFAZOLIN* 16 Resistant      * For uncomplicated UTI caused by E. coli,K. pneumoniae or P. mirabilis: Cefazolin issusceptible if MIC <32 mcg/mL and predictssusceptible to the oral agents cefaclor, cefdinir,cefpodoxime, cefprozil, cefuroxime, cephalexinand loracarbef.    CEFEPIME <=1 Sensitive     CEFTRIAXONE <=1 Sensitive     CIPROFLOXACIN <=0.25 Sensitive     LEVOFLOXACIN <=0.12 Sensitive     ERTAPENEM <=0.5 Sensitive     GENTAMICIN <=1 Sensitive     IMIPENEM <=0.25 Sensitive     NITROFURANTOIN 32 Sensitive     PIP/TAZO <=4 Sensitive     TOBRAMYCIN <=1 Sensitive     TRIMETH/SULFA* <=20 Sensitive      * For uncomplicated UTI caused by E. coli,K. pneumoniae or P. mirabilis: Cefazolin issusceptible if MIC <32 mcg/mL and predictssusceptible to the oral agents cefaclor, cefdinir,cefpodoxime, cefprozil, cefuroxime, cephalexinand loracarbef.Legend:S = Susceptible  I = IntermediateR = Resistant  NS = Not susceptible* = Not tested  NR = Not reported**NN = See antimicrobic comments  Resp Panel by RT-PCR (Flu A&B, Covid) Nasopharyngeal Swab     Status: None   Collection Time: 07/27/20  2:55 AM   Specimen: Nasopharyngeal Swab; Nasopharyngeal(NP) swabs in vial transport medium  Result Value Ref Range Status   SARS Coronavirus 2 by RT PCR NEGATIVE NEGATIVE Final    Comment: (NOTE) SARS-CoV-2 target nucleic acids are NOT DETECTED.  The SARS-CoV-2 RNA is generally detectable in upper respiratory specimens during the acute phase of infection. The lowest concentration of  SARS-CoV-2 viral copies this assay can detect is 138 copies/mL. A negative result does not preclude SARS-Cov-2 infection and should not be used as the sole basis for treatment or other patient management decisions. A negative result may occur with  improper  specimen collection/handling, submission of specimen other than nasopharyngeal swab, presence of viral mutation(s) within the areas targeted by this assay, and inadequate number of viral copies(<138 copies/mL). A negative result must be combined with clinical observations, patient history, and epidemiological information. The expected result is Negative.  Fact Sheet for Patients:  EntrepreneurPulse.com.au  Fact Sheet for Healthcare Providers:  IncredibleEmployment.be  This test is no t yet approved or cleared by the [place] FDA and  has been authorized for detection and/or diagnosis of SARS-CoV-2 by FDA under an Emergency Use Authorization (EUA). This EUA will remain  in effect (meaning this test can be used) for the duration of the COVID-19 declaration under Section 564(b)(1) of the Act, 21 U.S.C.section 360bbb-3(b)(1), unless the authorization is terminated  or revoked sooner.       Influenza A by PCR NEGATIVE NEGATIVE Final   Influenza B by PCR NEGATIVE NEGATIVE Final    Comment: (NOTE) The Xpert Xpress SARS-CoV-2/FLU/RSV plus assay is intended as an aid in the diagnosis of influenza from Nasopharyngeal swab specimens and should not be used as a sole basis for treatment. Nasal washings and aspirates are unacceptable for Xpert Xpress SARS-CoV-2/FLU/RSV testing.  Fact Sheet for Patients: EntrepreneurPulse.com.au  Fact Sheet for Healthcare Providers: IncredibleEmployment.be  This test is not yet approved or cleared by the [place] FDA and has been authorized for detection and/or diagnosis of SARS-CoV-2 by FDA under an Emergency Use  Authorization (EUA). This EUA will remain in effect (meaning this test can be used) for the duration of the COVID-19 declaration under Section 564(b)(1) of the Act, 21 U.S.C. section 360bbb-3(b)(1), unless the authorization is terminated or revoked.  Performed at [facility], [place] [address], [place], [place] [address]       Studies: No results found.  Scheduled Meds: . sodium chloride   Intravenous Once  . aspirin EC  81 mg Oral Daily  . atorvastatin  80 mg Oral QPM  . azithromycin  250 mg Oral Daily  . escitalopram  20 mg Oral Daily  . ferrous sulfate  325 mg Oral BID WC  . fluticasone furoate-vilanterol  1 puff Inhalation Daily  . gabapentin  200 mg Oral BID  . insulin aspart  0-15 Units Subcutaneous TID WC  . insulin aspart  0-5 Units Subcutaneous QHS  . insulin aspart  3 Units Subcutaneous TID WC  . [START ON 07/30/2020] insulin glargine  60 Units Subcutaneous Daily  . ipratropium-albuterol  3 mL Nebulization TID  . ketoconazole  1 application Topical BID  . metoprolol succinate  25 mg Oral QHS  . pantoprazole  40 mg Oral BID  . rOPINIRole  3 mg Oral TID    Continuous Infusions: . cefTRIAXone (ROCEPHIN)  IV 1 g (07/29/20 0840)     LOS: 2 days     Alma Friendly, MD [facility]  If 7PM-7AM, please contact night-coverage [email or web address] 07/29/2020, 5:08 PM

## 2020-07-29 NOTE — Evaluation (Signed)
Occupational Therapy Evaluation Patient Details Name: Janet Choi MRN: [id number] DOB: 1940-06-12 Today's Date: 07/29/2020    History of Present Illness Pt admitted acute on chronic respiratory failure (2L home O2) and acute on chronic diastolic heart failure.  Pt with hx of COPD, CHF, DM, CKD, CAD, and with new dx of AML.   Clinical Impression   Patient lives with children in a single level house with 4 steps to enter. At baseline patient reports using her cane most often vs walker, and is independent with self care tasks. Patient reports likes to go plays cards 1x/week, go out with friends to eat. Currently patient limited by decreased activity tolerance, states "everytime this happens I get weaker and more tired." Patient overall supervision level for OOB activity for safety. Recommend continued acute OT services to build endurance in order to safely participate in daily routine and facilitate D/C to venue listed below.    Follow Up Recommendations  No OT follow up;Supervision - Intermittent    Equipment Recommendations  None recommended by OT       Precautions / Restrictions Precautions Precautions: Fall Restrictions Weight Bearing Restrictions: No      Mobility Bed Mobility Overal bed mobility: Modified Independent             General bed mobility comments: use of bed rail    Transfers Overall transfer level: Needs assistance Equipment used: None Transfers: Stand Pivot Transfers   Stand pivot transfers: Supervision       General transfer comment: supervision for safety and line management for O2    Balance Overall balance assessment: Mild deficits observed, not formally tested                                         ADL either performed or assessed with clinical judgement   ADL Overall ADL's : Needs assistance/impaired Eating/Feeding: Independent   Grooming: Set up;Sitting   Upper Body Bathing: Set up;Sitting   Lower Body Bathing:  Supervison/ safety;Sit to/from stand   Upper Body Dressing : Set up;Sitting   Lower Body Dressing: Set up;Supervision/safety;Sitting/lateral leans;Sit to/from stand Lower Body Dressing Details (indicate cue type and reason): patient demo figure 4 technique to doff/don socks Toilet Transfer: Supervision/safety;Stand-pivot;[employer] Armed forces technical officer Details (indicate cue type and reason): no device needed to pivot to [facility] then to recliner, no loss of balance noted S for safety due to limited activity tolerance Toileting- Clothing Manipulation and Hygiene: Supervision/safety;Sitting/lateral lean;Sit to/from stand       Functional mobility during ADLs: Supervision/safety General ADL Comments: patient appears close to her baseline, limited primarily by decreased endurance patient reports "each time this happens I get weaker" state she has not been OOB much since hospitalization. At baseline likes to play cards 1x/week, go to lunch with friends.                  Pertinent Vitals/Pain Pain Assessment: No/denies pain     Hand Dominance Right   Extremity/Trunk Assessment Upper Extremity Assessment Upper Extremity Assessment: Overall WFL for tasks assessed   Lower Extremity Assessment Lower Extremity Assessment: Defer to PT evaluation       Communication Communication Communication: No difficulties   Cognition Arousal/Alertness: Awake/alert Behavior During Therapy: WFL for tasks assessed/performed Overall Cognitive Status: Within Functional Limits for tasks assessed  Home Living Family/patient expects to be discharged to:: Private residence Living Arrangements: Non-relatives/Friends;Children Available Help at Discharge: Family Type of Home: House Home Access: Stairs to enter [employer] of Steps: 4 Entrance Stairs-Rails: Can reach both Home Layout: One level     Bathroom Shower/Tub: Medical illustrator: Handicapped height Bathroom Accessibility: No   Home Equipment: Mining engineer - 2 wheels;Shower seat;Grab bars - tub/shower;Walker - 4 wheels;Transport chair;Bedside commode          Prior Functioning/Environment Level of Independence: Independent with assistive device(s)        Comments: patient reports using cane the most        OT Problem List: Decreased activity tolerance      OT Treatment/Interventions: Self-care/ADL training;Energy conservation;DME and/or AE instruction;Therapeutic activities;Patient/family education;Balance training    OT Goals(Current goals can be found in the care plan section) Acute Rehab OT Goals Patient Stated Goal: to shower OT Goal Formulation: With patient Time For Goal Achievement: 08/12/20 Potential to Achieve Goals: Good  OT Frequency: Min 2X/week    AM-PAC OT "6 Clicks" Daily Activity     Outcome Measure Help from another person eating meals?: None Help from another person taking care of personal grooming?: A Little Help from another person toileting, which includes using toliet, bedpan, or urinal?: A Little Help from another person bathing (including washing, rinsing, drying)?: A Little Help from another person to put on and taking off regular upper body clothing?: A Little Help from another person to put on and taking off regular lower body clothing?: A Little 6 Click Score: 19   End of Session Equipment Utilized During Treatment: Oxygen Nurse Communication: Mobility status  Activity Tolerance: Patient tolerated treatment well Patient left: in chair;with call bell/phone within reach;with chair alarm set  OT Visit Diagnosis: Other abnormalities of gait and mobility (R26.89)                Time: 6720-9470 OT Time Calculation (min): 19 min Charges:  OT General Charges $OT Visit: 1 Visit OT Evaluation $OT Eval Low Complexity: 1 Low  Delbert Phenix OT OT pager: [phone]  Rosemary Holms 07/29/2020, 8:33  AM

## 2020-07-30 DIAGNOSIS — D649 Anemia, unspecified: Secondary | ICD-10-CM | POA: Diagnosis not present

## 2020-07-30 DIAGNOSIS — C9200 Acute myeloblastic leukemia, not having achieved remission: Secondary | ICD-10-CM

## 2020-07-30 DIAGNOSIS — Z7189 Other specified counseling: Secondary | ICD-10-CM

## 2020-07-30 DIAGNOSIS — Z515 Encounter for palliative care: Secondary | ICD-10-CM

## 2020-07-30 LAB — CBC WITH DIFFERENTIAL/PLATELET
Abs Immature Granulocytes: 0.02 10*3/uL (ref 0.00–0.07)
Basophils Absolute: 0 10*3/uL (ref 0.0–0.1)
Basophils Relative: 1 %
Eosinophils Absolute: 0.1 10*3/uL (ref 0.0–0.5)
Eosinophils Relative: 5 %
HCT: 24.8 % — ABNORMAL LOW (ref 36.0–46.0)
Hemoglobin: 8 g/dL — ABNORMAL LOW (ref 12.0–15.0)
Immature Granulocytes: 1 %
Lymphocytes Relative: 24 %
Lymphs Abs: 0.4 10*3/uL — ABNORMAL LOW (ref 0.7–4.0)
MCH: 33.2 pg (ref 26.0–34.0)
MCHC: 32.3 g/dL (ref 30.0–36.0)
MCV: 102.9 fL — ABNORMAL HIGH (ref 80.0–100.0)
Monocytes Absolute: 0.1 10*3/uL (ref 0.1–1.0)
Monocytes Relative: 5 %
Neutro Abs: 1 10*3/uL — ABNORMAL LOW (ref 1.7–7.7)
Neutrophils Relative %: 64 %
Platelets: 108 10*3/uL — ABNORMAL LOW (ref 150–400)
RBC: 2.41 MIL/uL — ABNORMAL LOW (ref 3.87–5.11)
RDW: 18.6 % — ABNORMAL HIGH (ref 11.5–15.5)
WBC: 1.5 10*3/uL — ABNORMAL LOW (ref 4.0–10.5)
nRBC: 0 % (ref 0.0–0.2)

## 2020-07-30 LAB — TYPE AND SCREEN
ABO/RH(D): AB POS
Antibody Screen: NEGATIVE
Unit division: 0

## 2020-07-30 LAB — GLUCOSE, CAPILLARY
Glucose-Capillary: 363 mg/dL — ABNORMAL HIGH (ref 70–99)
Glucose-Capillary: 264 mg/dL — ABNORMAL HIGH (ref 70–99)
Glucose-Capillary: 181 mg/dL — ABNORMAL HIGH (ref 70–99)
Glucose-Capillary: 221 mg/dL — ABNORMAL HIGH (ref 70–99)

## 2020-07-30 LAB — BASIC METABOLIC PANEL
Anion gap: 9 (ref 5–15)
BUN: 43 mg/dL — ABNORMAL HIGH (ref 8–23)
CO2: 26 mmol/L (ref 22–32)
Calcium: 8.7 mg/dL — ABNORMAL LOW (ref 8.9–10.3)
Chloride: 103 mmol/L (ref 98–111)
Creatinine, Ser: 1.72 mg/dL — ABNORMAL HIGH (ref 0.44–1.00)
GFR, Estimated: 30 mL/min — ABNORMAL LOW (ref >60)
Glucose, Bld: 289 mg/dL — ABNORMAL HIGH (ref 70–99)
Potassium: 4.3 mmol/L (ref 3.5–5.1)
Sodium: 138 mmol/L (ref 135–145)

## 2020-07-30 LAB — BPAM RBC
Blood Product Expiration Date: 202204132359
ISSUE DATE / TIME: 202203221106
Unit Type and Rh: 6200

## 2020-07-30 MED ORDER — FUROSEMIDE 40 MG PO TABS
40.0000 mg | ORAL_TABLET | Freq: Every day | ORAL | Status: DC | PRN
  Administered 2020-07-31: 40 mg via ORAL
  Filled 2020-07-30: qty 1

## 2020-07-30 MED ORDER — FUROSEMIDE 10 MG/ML IJ SOLN
40.0000 mg | Freq: Once | INTRAMUSCULAR | Status: AC
  Administered 2020-07-30: 40 mg via INTRAVENOUS
  Filled 2020-07-30: qty 4

## 2020-07-30 NOTE — Plan of Care (Signed)
  Problem: Clinical Measurements: Goal: Diagnostic test results will improve Outcome: Progressing Goal: Respiratory complications will improve Outcome: Progressing   Problem: Activity: Goal: Risk for activity intolerance will decrease Outcome: Progressing   Problem: Coping: Goal: Level of anxiety will decrease Outcome: Progressing   Problem: Health Behavior/Discharge Planning: Goal: Ability to manage health-related needs will improve Outcome: Adequate for Discharge   Problem: Nutrition: Goal: Adequate nutrition will be maintained Outcome: Adequate for Discharge   Problem: Pain Managment: Goal: General experience of comfort will improve Outcome: Adequate for Discharge

## 2020-07-30 NOTE — Progress Notes (Signed)
Palliative care brief progress note  Palliative care consult received.  Chart reviewed including personal review of pertinent labs and imaging.  I stopped by to meet with Janet Choi this evening.  She was in the bathroom at time of my arrival.  Her daughter was present in her room.  Discussed setting up a family meeting.  Plan for family meeting tomorrow at 1:30pm.  Micheline Rough, MD [facility] [phone]  No charge note

## 2020-07-30 NOTE — Consult Note (Signed)
Consultation Note Date: 07/30/2020   Patient Name: Janet Choi  DOB: Mar 21, 1941  MRN: [id number]  Age / Sex: 80 y.o., female  PCP: [name], MD Referring Physician: Kerney Elbe, DO  Reason for Consultation: Establishing goals of care  HPI/Patient Profile: 80 y.o. female  with past medical history of AML, history of heart failure with preserved ejection action, Chronic hypoxic respiratory failure on 2 L supplemental oxygen, COPD, OSA, CAD, insulin-dependent diabetes, CKD stage III admitted on 07/26/2020 after presenting with increased shortness of breath.   Clinical Assessment and Goals of Care: Palliative care consult received.  Chart reviewed including personal review of pertinent labs and imaging.  I met today with Janet Choi and her sister, [name].  I introduced palliative care as specialized medical care for people living with serious illness. It focuses on providing relief from the symptoms and stress of a serious illness. The goal is to improve quality of life for both the patient and the family.  Janet Choi reports that most important things to her are her family and her faith.  She lives with 3 people including her son, a friend that she has known for a long time, and his girlfriend.  She is very close with her sister who helps her with medical decisions and is her most reliable support.  She reports that her son cares about her but is not really in a position to serve as an effective caregiver.  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  After retiring from this, she worked as a Solicitor for many years as well.  She reports her faith is very important to her and she is not afraid of dying as she knows where she is going when her life here ends.  She reports having good understanding of her medical conditions and we discussed  conversation that she had with [name] today regarding options for her care moving forward.  She reports that her concerns are that her son is able to take care of himself, not being uncomfortable or suffering, and being at home.  We discussed clinical course as well as wishes moving forward in regard to advanced directives and care plan in light of the stated goals.  Concepts specific to code status and rehospitalization discussed.  We discussed difference between a aggressive medical intervention path and a palliative, comfort focused care path.  Values and goals of care important to patient and family were attempted to be elicited.  Concept of Hospice and Palliative Care were discussed  Questions and concerns addressed.   PMT will continue to support holistically.  SUMMARY OF RECOMMENDATIONS   -DNR/DNI -Following in-depth conversation regarding options moving forward, Janet Choi tells me that she believes should be best served by working to transition home with hospice support.  She does not want to die at home, however, I will be interested in transition to [facility] as she approaches end-of-life more acutely.  Discussed plan for me to reach  out to hospice and that I will follow-up again tomorrow morning around 9 AM to see what further questions they may have.  Code Status/Advance Care Planning:  DNR   Symptom Management:   Pain: Continue requip, tylenol, and hydrocodone as needed  Palliative Prophylaxis:   Frequent Pain Assessment  Additional Recommendations (Limitations, Scope, Preferences):  Avoid Hospitalization   Prognosis:   < 6 months if her disease follows its natural course and she should qualify for home hospice if so desired.  Discharge Planning: Home with Hospice most likely     Primary Diagnoses: Present on Admission: . Acute on chronic respiratory failure ([place])   I have reviewed the medical record, interviewed the patient and family, and examined  the patient. The following aspects are pertinent.  Past Medical History:  Diagnosis Date  . Anemia    hx (04/14/2016)  . Anxiety   . Arthritis    "severe in my back; hands; ankles" (04/14/2016)  . Atypical chest pain 01/24/2018   Midline pain absent supine "constant" daytime since 12/31/17 > resolved as of 02/20/2018 on gerd/ gas diet   . Basal cell carcinoma    "several burned off; one cut off"  . CAD in native artery    a. NSTEMI 04/2016 - s/p DES toLAD and LCx. PCI to LCx notable for microembolization during cath.  . Chest pain- reslved with stopping Brilinta now on Plaix 04/16/2016  . Chronic lower back pain   . CKD (chronic kidney disease), stage III (HCC)    stage 3  . Complication of anesthesia   . COPD (chronic obstructive pulmonary disease) ([place])   . Depression   . Diverticulosis   . DJD (degenerative joint disease)   . Family history of adverse reaction to anesthesia   . Gallstones   . GERD (gastroesophageal reflux disease)   . History of gout   . History of kidney stones   . Hyperlipidemia   . Hypertension   . IBS (irritable bowel syndrome)   . Ischemic cardiomyopathy    a. 04/2016: EF 40-50% by cath, 50-55% +WMA by echo.  . 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 ([place]) ~ 2015  . Morbid obesity ([place])   . NSTEMI (non-ST elevated myocardial infarction) ([place]) 04/14/2016  . Obstructive sleep apnea of adult    appt for cpap 8-8 to adjust settings no cpap at this time  . On supplemental oxygen therapy    uses at bedtime or exposed to heat, 2Liters  . Peripheral neuropathy    feet and toes  . Peripheral vascular disease ([place])   . RLS (restless legs syndrome)   . Spinal stenosis   . Type II diabetes mellitus ([place])   . Vitamin D deficiency   . Walking pneumonia   . Wears partial dentures    lower   Social History   Socioeconomic History  . Marital status: Widowed    Spouse name: Not on file  . Number of children: 1  . Years of education: Not on file  . Highest  education level: Not on file  Occupational History  . Occupation: retired  Tobacco Use  . Smoking status: Former Smoker    Packs/day: 2.50    Years: 25.00    Pack years: 62.50    Types: Cigarettes    Quit date: 11/11/1983    Years since quitting: 36.7  . Smokeless tobacco: Never Used  Vaping Use  . Vaping Use: Never used  Substance and Sexual Activity  . Alcohol use:  No    Alcohol/week: 0.0 standard drinks  . Drug use: No  . Sexual activity: Not Currently  Other Topics Concern  . Not on file  Social History Narrative  . Not on file   Social Determinants of Health   Financial Resource Strain: Not on file  Food Insecurity: Not on file  Transportation Needs: Not on file  Physical Activity: Not on file  Stress: Not on file  Social Connections: Not on file   Family History  Problem Relation Age of Onset  . Heart disease Mother   . Heart attack Mother   . Kidney disease Father   . AAA (abdominal aortic aneurysm) Father   . Parkinson's disease Father   . Cancer Brother        type unknown  . Heart disease Son   . Cancer Sister        female   . Alzheimer's disease Sister   . Colon cancer Neg Hx   . Colon polyps Neg Hx   . Esophageal cancer Neg Hx   . Pancreatic cancer Neg Hx   . Stomach cancer Neg Hx   . Liver disease Neg Hx   . Diabetes Neg Hx    Scheduled Meds: . sodium chloride   Intravenous Once  . aspirin EC  81 mg Oral Daily  . atorvastatin  80 mg Oral QPM  . azithromycin  250 mg Oral Daily  . escitalopram  20 mg Oral Daily  . ferrous sulfate  325 mg Oral BID WC  . fluticasone furoate-vilanterol  1 puff Inhalation Daily  . gabapentin  200 mg Oral BID  . insulin aspart  0-15 Units Subcutaneous TID WC  . insulin aspart  0-5 Units Subcutaneous QHS  . insulin aspart  3 Units Subcutaneous TID WC  . insulin glargine  60 Units Subcutaneous Daily  . ipratropium-albuterol  3 mL Nebulization TID  . ketoconazole  1 application Topical BID  . metoprolol succinate   25 mg Oral QHS  . pantoprazole  40 mg Oral BID  . rOPINIRole  3 mg Oral TID   Continuous Infusions: . cefTRIAXone (ROCEPHIN)  IV 1 g (07/30/20 0949)   PRN Meds:.acetaminophen **OR** acetaminophen, albuterol, benzonatate, furosemide, HYDROcodone-acetaminophen, ondansetron **OR** ondansetron (ZOFRAN) IV, tiZANidine Medications Prior to Admission:  Prior to Admission medications   Medication Sig Start Date End Date Taking? Authorizing Provider  acetaminophen (TYLENOL) 500 MG tablet Take 1,000 mg by mouth every 6 (six) hours as needed for headache (pain).   Yes [provider]  albuterol (VENTOLIN HFA) 108 (90 Base) MCG/ACT inhaler Inhale 2 puffs into the lungs every 4 (four) hours as needed for wheezing or shortness of breath. 09/07/19  Yes Vladimir Crofts, PA-C  Ascorbic Acid (VITAMIN C) 1000 MG tablet Take 1,000 mg by mouth 2 (two) times daily.   Yes [provider]  aspirin EC 81 MG EC tablet Take 1 tablet (81 mg total) by mouth daily. 04/21/16  Yes Isaiah Serge, NP  atorvastatin (LIPITOR) 80 MG tablet Take 80 mg by mouth at bedtime. 07/21/20  Yes [provider]  Cholecalciferol 125 MCG (5000 UT) capsule Take 5,000 Units by mouth 2 (two) times daily.    Yes [provider]  diclofenac sodium (VOLTAREN) 1 % GEL Apply 4 g topically 4 (four) times daily. Patient taking differently: Apply 4 g topically 2 (two) times daily as needed (pain). 01/18/18  Yes Vicie Mutters R, PA-C  escitalopram (LEXAPRO) 20 MG tablet Take 1  tablet (20 mg total) by mouth daily. 01/08/20 01/07/21 Yes McClanahan, Danton Sewer, NP  furosemide (LASIX) 40 MG tablet Take 40 mg by mouth daily as needed for fluid or edema.   Yes [provider]  gabapentin (NEURONTIN) 100 MG capsule TAKE 1 TO 2 CAPSULES 3 TO 4 X /DAY AS NEEDED FOR PAINFUL DIABETIC NEUROPATHY Patient taking differently: Take 200 mg by mouth 2 (two) times daily. for Painful Diabetic Neuropathy 05/26/20  Yes [name],  MD  Insulin Glargine (BASAGLAR KWIKPEN) 100 UNIT/ML INJECT 70 UNITS DAILY FOR DIABETES Patient taking differently: Inject 70 Units into the skin at bedtime. 04/29/20  Yes Liane Comber, NP  ketoconazole (NIZORAL) 2 % cream APPLY TO AFFECTED AREA TWICE A DAY Patient taking differently: Apply 1 application topically 2 (two) times daily. For facial itching 11/16/19  Yes [name], MD  liraglutide (VICTOZA) 18 MG/3ML SOPN Inject 1.2 mg into the skin every morning. Patient taking differently: Inject 1.2 mg into the skin daily at 12 noon. 07/22/20  Yes McClanahan, Danton Sewer, NP  metoprolol succinate (TOPROL-XL) 25 MG 24 hr tablet Take 1 tablet (25 mg total) by mouth at bedtime. 07/25/19  Yes Duke, Tami Lin, PA  nitrofurantoin, macrocrystal-monohydrate, (MACROBID) 100 MG capsule Take 1 capsule (100 mg total) by mouth 2 (two) times daily. 07/25/20  Yes McClanahan, Kyra, NP  NOVOLOG FLEXPEN 100 UNIT/ML FlexPen INJECT 50 TO 75 UNITS 3 X /DAY BEFORE MEALS AS DIRECTED FOR DIABETES Patient taking differently: Inject 20-25 Units into the skin See admin instructions. Inject 20 units subcutaneously three times daily before meals, if CBG >130 increase to 25 units 11/16/19  Yes [name], MD  nystatin (MYCOSTATIN) 100000 UNIT/ML suspension 5 ml four times a day, retain in mouth as long as possible (Swish and Swallow).  Use for 48 hours after symptoms resolve. Patient taking differently: Use as directed 5 mLs in the mouth or throat See admin instructions. Take 5 ml by mouth four times a day, retain in mouth as long as possible (Swish and Swallow).  Use for 48 hours after symptoms resolve. 07/23/20  Yes McClanahan, Danton Sewer, NP  OXYGEN Inhale 2 L into the lungs continuous.   Yes [provider]  pantoprazole (PROTONIX) 40 MG tablet Take 1 tablet (40 mg total) by mouth 2 (two) times daily as needed. For acid reflux. 06/12/20 06/12/21 Yes Liane Comber, NP  Polyethylene Glycol 400 (BLINK TEARS OP) Place 1 drop  into both eyes daily as needed (dry eyes).    Yes [provider]  rOPINIRole (REQUIP) 3 MG tablet TAKE 1 TABLET 4 TIMES A DAY FOR RESTLESS LEGS Patient taking differently: Take 6 mg by mouth 2 (two) times daily. 03/25/20  Yes Liane Comber, NP  tiZANidine (ZANAFLEX) 4 MG tablet Take 2 mg by mouth at bedtime as needed for muscle spasms. 07/25/20  Yes [provider]  triamcinolone (NASACORT) 55 MCG/ACT AERO nasal inhaler Place 2 sprays into the nose at bedtime. Patient taking differently: Place 2 sprays into the nose 2 (two) times daily. 04/16/20 04/16/21 Yes McClanahan, Kyra, NP  umeclidinium-vilanterol (ANORO ELLIPTA) 62.5-25 MCG/INH AEPB Inhale 1 puff into the lungs daily.   Yes [provider]  vitamin B-12 (CYANOCOBALAMIN) 1000 MCG tablet Take 1,000 mcg by mouth daily.   Yes [provider]  vitamin E 180 MG (400 UNITS) capsule Take 400 Units by mouth daily.   Yes [provider]  BD PEN NEEDLE NANO 2ND GEN 32G X 4 MM MISC USE AS DIRECTED 4  TIMES A DAY Patient taking differently: 1 each by Other route in the morning, at noon, in the evening, and at bedtime. 06/06/20   [name], MD  Budeson-Glycopyrrol-Formoterol (BREZTRI AEROSPHERE) 160-9-4.8 MCG/ACT AERO Inhale 2 puffs into the lungs in the morning and at bedtime. 10/15/19   Collene Gobble, MD  buPROPion (WELLBUTRIN XL) 300 MG 24 hr tablet TAKE 1 TABLET (300 MG TOTAL) BY MOUTH DAILY FOR MOOD Patient not taking: No sig reported 03/25/20   Liane Comber, NP  Continuous Blood Gluc Receiver (FREESTYLE LIBRE 14 DAY READER) DEVI 1 Device by Does not apply route every 14 (fourteen) days. 09/07/19   Vladimir Crofts, PA-C  Continuous Blood Gluc Sensor (FREESTYLE LIBRE 14 DAY SENSOR) MISC Apply 1 application topically every 14 (fourteen) days. 09/07/19   Vladimir Crofts, PA-C  ferrous sulfate 325 (65 FE) MG tablet Take 325 mg by mouth 2 (two) times daily with a meal.  Patient not taking: No sig  reported    [provider]  Lancets (ONETOUCH ULTRASOFT) lancets Use as instructed 04/27/16   Philemon Kingdom, MD  nitroGLYCERIN (NITROSTAT) 0.4 MG SL tablet Place 1 tablet (0.4 mg total) under the tongue every 5 (five) minutes x 3 doses as needed for chest pain. Patient not taking: No sig reported 10/04/17   [name], MD  [facility] VERIO test strip USE AS INSTRUCTED TO CHECK SUGAR 2 TIMES DAILY. Patient taking differently: 1 each by Other route in the morning and at bedtime. 09/21/17   Philemon Kingdom, MD   Allergies  Allergen Reactions  . Brilinta [Ticagrelor] Shortness Of Breath and Other (See Comments)    Chest pain (also)  . Aspirin Other (See Comments)    Can tolerate in small doses (is already on a blood thinner AND has kidney disease)  . Nsaids Other (See Comments)    Patient is taking a blood thinner and has kidney disease  . Minocycline Hcl Other (See Comments)    Welts  . Oruvail [Ketoprofen] Other (See Comments)    Has kidney disease and is taking a blood thinner  . Tricor [Fenofibrate] Other (See Comments)    Reaction unknown  . Vasotec [Enalaprilat] Other (See Comments)    Reaction unknown  . Zinc Other (See Comments)    Reaction unknown   Review of Systems  Constitutional: Positive for activity change, appetite change and fatigue.  Respiratory: Positive for cough, choking and shortness of breath.   Neurological: Positive for weakness.  Psychiatric/Behavioral: Positive for sleep disturbance.   Physical Exam General: Alert, awake, in no acute distress.  HEENT: No bruits, no goiter, no JVD Lungs: Good air movement, clear Abdomen: nondistended, positive bowel sounds.  Ext: No significant edema Skin: Warm and dry Neuro: Grossly intact, nonfocal.  Vital Signs: BP 103/83 (BP Location: Right Arm)   Pulse 79   Temp 98 F (36.7 C) (Oral)   Resp 18   Ht _0  (1.549 m)   Wt 117.4 kg   SpO2 92%   BMI 48.90 kg/m  Pain Scale: 0-10   Pain  Score: 0-No pain   SpO2: SpO2: 92 % O2 Device:SpO2: 92 % O2 Flow Rate: .O2 Flow Rate (L/min): 2 L/min  IO: Intake/output summary:   Intake/Output Summary (Last 24 hours) at 07/30/2020 2229 Last data filed at 07/30/2020 1700 Gross per 24 hour  Intake 731.14 ml  Output 1250 ml  Net -518.86 ml    LBM: Last BM Date: 07/29/20 Baseline Weight: Weight: 117.5 kg Most recent  weight: Weight: 117.4 kg     Palliative Assessment/Data:     Time In: 1330 Time Out: 1455 Time Total: 85 minutes Greater than 50%  of this time was spent counseling and coordinating care related to the above assessment and plan.  Signed by: Micheline Rough, MD   Please contact Palliative Medicine Team phone at [phone] for questions and concerns.  For individual provider: See Shea Evans

## 2020-07-30 NOTE — Progress Notes (Addendum)
Manufacturing engineer [facility])  Received request for hospice services at home after discharge.  Chart and pt information under review by [facility] physician.  Hospice eligibility pending at this time.  [facility] liaison will reach out to family and patient tomorrow to discuss hospice support at home.  Thank you for the opportunity to participate in this pt's care.  Domenic Moras, BSN, RN [facility] [phone] (24h on call)

## 2020-07-30 NOTE — Progress Notes (Signed)
PROGRESS NOTE    Janet Choi  WUJ:[id number] DOB: 04/08/1941 DOA: 07/26/2020 PCP: [name], MD   Brief Narrative:  The patient is a 80 year old morbidly obese Caucasian female with a past medical history significant for but not limited to AML, history of heart failure with preserved ejection fraction, chronic hypoxic respiratory failure who wears 2 L supplemental oxygen via nasal cannula chronically, COPD, OSA, CAD, insulin-dependent diabetes mellitus type 2, CKD stage IIIb as well as other comorbidities who presented to the ER 07/26/2020 with worsening dyspnea.  Patient stated for the last week or so she has been having progressively worsening dyspnea has been compliant with her rescue inhaler and had no significant provement.  She also noted a 15 pound weight gain over last 2 months and recently had to increase her supplemental oxygen.  She was recently seen by her PCP and was advised to start taking Lasix 40 mg daily as scheduled.  Previously she is on as needed Lasix.  She was diagnosed with a UTI and started on Macrobid and then because she progressively had worsening symptoms EMS was activated and EMS found her to be saturating 85% on 2 L of supplemental oxygen.  She was placed on nonrebreather with improvement in her saturations and brought to the ED.  Labs showed hyperglycemia and leukopenia.  CT of the chest was negative for PE but did show bronchitis.  She was admitted for further management and treatment and palliative care and medical oncology were consulted for further goals of care discussion.  Patient has elected to go home with hospice and she is given diuresis today after her transfusion of her 1 unit of PRBCs yesterday.  Patient still feels Lyme overloaded so we will need to monitor her strict I's and O's daily weights.  Anticipating discharging home with hospice services tomorrow if equipment can be set up  Assessment & Plan:   Active Problems:   Acute on chronic respiratory  failure (HCC)   Symptomatic anemia   Counseling regarding advance care planning and goals of care  Acute on chronic hypoxic respiratory failure -Currently requiring about 3 L of O2 which is close to her baseline -Likely 2/2 COPD with bronchitis versus volume overload -CTA chest negative for PE but showed signs of bronchitis -Continue Zithromax, IV ceftriaxone to cover for any pneumonia -SpO2: 94 % O2 Flow Rate (L/min): 4 L/min -Continue DuoNeb 3 mL TID, inhalers Breo Ellipta -Diurese as below -Continuous pulse oximetry maintain O2 saturation greater 90%  -Continue supplemental oxygen via nasal cannula and wean O2 as tolerated to home regimen  Acute on Chronic Diastolic HF -Noted significant weight gain -BNP 237.9  -Noted pulmonary edema on chest x-ray -Troponin with flat trend, EKG showed no acute ST changes -Echo done in 12/2019 showed grade 2 diastolic dysfunction, with preserved EF -Initially held hold Lasix due to rise in creatinine this could be hypoperfusion to the kidneys so we will give her a dose of IV Lasix -Continue with metoprolol succinate 25 mg p.o. nightly -Strict I's and O's, daily weights; patient was positive but now she is -117.4 mL -Changed from admission but down 3 pounds from yesterday -Continue to monitor and repeat chest x-ray in a.m.  AKI on CKD stage IIIb -Mild bump in Creatinine -Her p.o. Lasix was held but will give 1 dose of IV Lasix given that she appears volume overloaded -She has had a slight increase in her creatinine and BUN/creatinine worsened from last week and went from 28/1.83 ->  32/1.83 -> 37/1.64 -> 43/1.72 -Continue to monitor and trend renal function and diuresis as needed for her dyspnea and swelling -Avoid further nephrotoxic medications, contrast dyes, hypotension and renally dose medications -Repeat CMP in a.m.  History of CAD -Currently chest pain-free -Recent Lipid Panel done on 07/23/20 -Continue aspirin 81 mg p.o. daily,  atorvastatin 80 g p.o. nightly, metoprolol succinate 25 mg p.o. nightly -Continue to monitor on telemetry  Hyperlipidemia -Patient is total cholesterol/HDL ratio 6.3, cholesterol was 234, HDL is 37, LDL is 136, non-HDL cholesterol is 197, triglycerides were 397 -Continue with atorvastatin 80 mg daily  GERD -Continue with Pantoprazole 40 mg p.o. twice daily  COPD with possible bronchitis -Management as above  E. coli UTI Urine culture grew E. coli Continue IV ceftriaxone  History of Chronic Pancytopenia/Macrocytic Anemia History of AML -Hgb drop to 6.9, transfused 1U of PRBC on 07/29/20 -Now her hemoglobin/hematocrit has improved to 8.0/24.8; she continues to remain pancytopenic with a WBC of 1.5 and a platelet count of 108 today -Continue to Monitor -[name] of Medical Oncology consulted and he had a detailed discussion with the patient and continuing her goals of care from clinic and patient is clear that she does not want any chemotherapy or other involved oncological interventions for AML and understands that she is not a candidate for induction chemotherapy or transplant and she has chosen not to take the burden of using 5 days plus or minus of Venetoclax -Per Dr. Grier Mitts discussion she is also not particularly keen on intense ongoing transfusion support and recurrent visits to the cancer center or hospitalization; per oncology they are recommending no acute interventions for AML at this time but are recommending transfusion for hemoglobin less than 7.5 or if she is symptomatic -Health care consulted for goals of care discussion and patient wants to go home with hospice care and a referral has been made  Diabetes mellitus type 2, uncontrolled with hyperglycemia History of neuropathy -Had a HbA1c 7.9 on 07/23/2020 -C/w 60 units of Lantus sq Daily, Moderate Novolog SSI AC/HS, and 3 Units TIDwm Accu-Cheks, hypoglycemic protocol,  -Continue Gabapentin 200 mg po BID -Continue to  monitor blood sugars per protocol and adjust insulin regimen as necessary -CBGs ranging from 181-273  OSA -Not on CPAP -Continue supplemental oxygen as above  Restless leg/Sciatica -Continue Ropinirole 3 mg po TID and Tizanidine 2 mg po qHSprn Muscle Spasms  Morbid Obesity -Complicates overall prognosis and Care -Estimated body mass index is 48.9 kg/m as calculated from the following:   Height as of this encounter: 5\' 1"  (1.549 m).   Weight as of this encounter: 117.4 kg. -Weight Loss and Dietary Counseling given   Goals of Care Discussion -Patient with multiple comorbidities, poor prognosis -Confirmed DNR -Requested discussion with palliative team for further goals of care discussion; after patient discussed with medical oncology and palliative care she wants to go home with hospice care and services -Palliative care has made the hospice referral by the [facility] to be placed in hospice will be seeing the patient tomorrow; likely can be discharged once all equipment has been delivered by hospice  DVT prophylaxis: SCDs Code Status: DO NOT RESUSCITATE Family Communication: Discussed with Sisters at bedside  Disposition Plan: Anticipated Home with Home Hospice   Status is: Inpatient  Remains inpatient appropriate because:Unsafe d/c plan, IV treatments appropriate due to intensity of illness or inability to take PO and Inpatient level of care appropriate due to severity of illness   Dispo: The patient is  from: Home              Anticipated d/c is to: Home              Patient currently is not medically stable to d/c.   Difficult to place patient No  Consultants:   Medical Oncology  Palliative Care Medicine  Procedures:   Antimicrobials:  Anti-infectives (From admission, onward)   Start     Dose/Rate Route Frequency Ordered Stop   07/27/20 1000  azithromycin (ZITHROMAX) tablet 250 mg        250 mg Oral Daily 07/27/20 0523     07/27/20 1000  cefTRIAXone (ROCEPHIN) 1 g  in sodium chloride 0.9 % 100 mL IVPB        1 g 200 mL/hr over 30 Minutes Intravenous Every 24 hours 07/27/20 0833     07/27/20 0530  fosfomycin (MONUROL) packet 3 g  Status:  Discontinued        3 g Oral  Once 07/27/20 0523 07/27/20 9937        Subjective: Seen and examined at bedside and she is doing okay but still felt tight in her legs and felt volume overloaded.  His shortness of breath is improved slightly.  No nausea or vomiting.  Denies any lightheadedness or dizziness.  No other concerns or complaints at this time but wanting to go home.  Objective: Vitals:   07/30/20 0510 07/30/20 0700 07/30/20 0814 07/30/20 1317  BP: (!) 140/59   (!) 167/92  Pulse: 68   79  Resp: 20   18  Temp: 98.4 F (36.9 C)   98.6 F (37 C)  TempSrc: Oral   Oral  SpO2: 94%  95% 94%  Weight:  117.4 kg    Height:        Intake/Output Summary (Last 24 hours) at 07/30/2020 1818 Last data filed at 07/30/2020 1700 Gross per 24 hour  Intake 851.14 ml  Output 1250 ml  Net -398.86 ml   Filed Weights   07/28/20 0500 07/29/20 0536 07/30/20 0700  Weight: 117.8 kg 118.6 kg 117.4 kg   Examination: Physical Exam:  Constitutional: WN/WD morbidly obese Caucasian female currently no acute distress appears calm and slightly uncomfortable seen at the edge of the bed Eyes: Lids and conjunctivae normal, sclerae anicteric  ENMT: External Ears, Nose appear normal. Grossly normal hearing  Neck: Appears normal, supple, no cervical masses, normal ROM, no appreciable thyromegaly; difficult to assess JVD given her body habitus Respiratory: Diminished to auscultation bilaterally with coarse breath sounds and some mild crackles., no wheezing, rales, rhonchi.  She has a normal respiratory rate and unlabored breathing but is wearing supplemental oxygen nasal cannula Cardiovascular: RRR, no murmurs / rubs / gallops. S1 and S2 auscultated.  1+ lower extremity edema Abdomen: Soft, non-tender, distended secondary to body  habitus. Bowel sounds positive.  GU: Deferred. Musculoskeletal: No clubbing / cyanosis of digits/nails. Normal strength and muscle tone.  Skin: No rashes, lesions, ulcers on limited skin evaluation. No induration; Warm and dry.  Neurologic: CN 2-12 grossly intact with no focal deficits. Romberg sign and cerebellar reflexes not assessed.  Psychiatric: Normal judgment and insight. Alert and oriented x 3. Normal mood and appropriate affect.   Data Reviewed: I have personally reviewed following labs and imaging studies  CBC: Recent Labs  Lab 07/26/20 2323 07/27/20 0955 07/28/20 0507 07/29/20 0556 07/29/20 1747 07/30/20 0651  WBC 1.5* 1.7* 1.5* 1.5*  --  1.5*  NEUTROABS 1.0* 0.9* 0.9* 1.0*  --  1.0*  HGB 7.1* 7.4* 7.4* 6.9* 7.8* 8.0*  HCT 23.3* 24.0* 24.4* 22.9* 25.2* 24.8*  MCV 111.5* 110.6* 109.9* 111.7*  --  102.9*  PLT 96* 100* 109* 105*  --  093*   Basic Metabolic Panel: Recent Labs  Lab 07/26/20 2323 07/27/20 0955 07/28/20 0507 07/29/20 0556 07/30/20 0515  NA 139 139 138 141 138  K 3.9 3.7 3.9 4.7 4.3  CL 103 100 98 104 103  CO2 27 28 29 28 26   GLUCOSE 292* 201* 297* 275* 289*  BUN 28* 27* 32* 37* 43*  CREATININE 1.21* 1.34* 1.83* 1.64* 1.72*  CALCIUM 8.7* 8.8* 8.7* 8.9 8.7*  MG  --  2.0  --   --   --   PHOS  --  3.8  --   --   --    GFR: Estimated Creatinine Clearance: 31.7 mL/min (A) (by C-G formula based on SCr of 1.72 mg/dL (H)). Liver Function Tests: Recent Labs  Lab 07/26/20 2323  AST 16  ALT 16  ALKPHOS 56  BILITOT 0.4  PROT 6.3*  ALBUMIN 3.5   No results for input(s): LIPASE, AMYLASE in the last 168 hours. No results for input(s): AMMONIA in the last 168 hours. Coagulation Profile: No results for input(s): INR, PROTIME in the last 168 hours. Cardiac Enzymes: No results for input(s): CKTOTAL, CKMB, CKMBINDEX, TROPONINI in the last 168 hours. BNP (last 3 results) No results for input(s): PROBNP in the last 8760 hours. HbA1C: No results for  input(s): HGBA1C in the last 72 hours. CBG: Recent Labs  Lab 07/29/20 1608 07/29/20 2142 07/30/20 0736 07/30/20 1144 07/30/20 1641  GLUCAP 175* 232* 363* 264* 181*   Lipid Profile: No results for input(s): CHOL, HDL, LDLCALC, TRIG, CHOLHDL, LDLDIRECT in the last 72 hours. Thyroid Function Tests: No results for input(s): TSH, T4TOTAL, FREET4, T3FREE, THYROIDAB in the last 72 hours. Anemia Panel: No results for input(s): VITAMINB12, FOLATE, FERRITIN, TIBC, IRON, RETICCTPCT in the last 72 hours. Sepsis Labs: No results for input(s): PROCALCITON, LATICACIDVEN in the last 168 hours.  Recent Results (from the past 240 hour(s))  Urine Culture     Status: Abnormal   Collection Time: 07/23/20  3:38 PM  Result Value Ref Range Status   MICRO NUMBER: [id number]  Final   SPECIMEN QUALITY: Adequate  Final   Sample Source URINE  Final   STATUS: FINAL  Final   ISOLATE 1: Escherichia coli (A)  Final    Comment: Greater than 100,000 CFU/mL of Escherichia coli      Susceptibility   Escherichia coli - URINE CULTURE, REFLEX    AMOX/CLAVULANIC >=32 Resistant     AMPICILLIN >=32 Resistant     AMPICILLIN/SULBACTAM >=32 Resistant     CEFAZOLIN* 16 Resistant      * For uncomplicated UTI caused by E. coli,K. pneumoniae or P. mirabilis: Cefazolin issusceptible if MIC <32 mcg/mL and predictssusceptible to the oral agents cefaclor, cefdinir,cefpodoxime, cefprozil, cefuroxime, cephalexinand loracarbef.    CEFEPIME <=1 Sensitive     CEFTRIAXONE <=1 Sensitive     CIPROFLOXACIN <=0.25 Sensitive     LEVOFLOXACIN <=0.12 Sensitive     ERTAPENEM <=0.5 Sensitive     GENTAMICIN <=1 Sensitive     IMIPENEM <=0.25 Sensitive     NITROFURANTOIN 32 Sensitive     PIP/TAZO <=4 Sensitive     TOBRAMYCIN <=1 Sensitive     TRIMETH/SULFA* <=20 Sensitive      * For uncomplicated UTI caused by E. coli,K. pneumoniae or P. mirabilis:  Cefazolin issusceptible if MIC <32 mcg/mL and predictssusceptible to the oral agents cefaclor,  cefdinir,cefpodoxime, cefprozil, cefuroxime, cephalexinand loracarbef.Legend:S = Susceptible  I = IntermediateR = Resistant  NS = Not susceptible* = Not tested  NR = Not reported**NN = See antimicrobic comments  Resp Panel by RT-PCR (Flu A&B, Covid) Nasopharyngeal Swab     Status: None   Collection Time: 07/27/20  2:55 AM   Specimen: Nasopharyngeal Swab; Nasopharyngeal(NP) swabs in vial transport medium  Result Value Ref Range Status   SARS Coronavirus 2 by RT PCR NEGATIVE NEGATIVE Final    Comment: (NOTE) SARS-CoV-2 target nucleic acids are NOT DETECTED.  The SARS-CoV-2 RNA is generally detectable in upper respiratory specimens during the acute phase of infection. The lowest concentration of SARS-CoV-2 viral copies this assay can detect is 138 copies/mL. A negative result does not preclude SARS-Cov-2 infection and should not be used as the sole basis for treatment or other patient management decisions. A negative result may occur with  improper specimen collection/handling, submission of specimen other than nasopharyngeal swab, presence of viral mutation(s) within the areas targeted by this assay, and inadequate number of viral copies(<138 copies/mL). A negative result must be combined with clinical observations, patient history, and epidemiological information. The expected result is Negative.  Fact Sheet for Patients:  EntrepreneurPulse.com.au  Fact Sheet for Healthcare Providers:  IncredibleEmployment.be  This test is no t yet approved or cleared by the [place] FDA and  has been authorized for detection and/or diagnosis of SARS-CoV-2 by FDA under an Emergency Use Authorization (EUA). This EUA will remain  in effect (meaning this test can be used) for the duration of the COVID-19 declaration under Section 564(b)(1) of the Act, 21 U.S.C.section 360bbb-3(b)(1), unless the authorization is terminated  or revoked sooner.       Influenza A  by PCR NEGATIVE NEGATIVE Final   Influenza B by PCR NEGATIVE NEGATIVE Final    Comment: (NOTE) The Xpert Xpress SARS-CoV-2/FLU/RSV plus assay is intended as an aid in the diagnosis of influenza from Nasopharyngeal swab specimens and should not be used as a sole basis for treatment. Nasal washings and aspirates are unacceptable for Xpert Xpress SARS-CoV-2/FLU/RSV testing.  Fact Sheet for Patients: EntrepreneurPulse.com.au  Fact Sheet for Healthcare Providers: IncredibleEmployment.be  This test is not yet approved or cleared by the [place] FDA and has been authorized for detection and/or diagnosis of SARS-CoV-2 by FDA under an Emergency Use Authorization (EUA). This EUA will remain in effect (meaning this test can be used) for the duration of the COVID-19 declaration under Section 564(b)(1) of the Act, 21 U.S.C. section 360bbb-3(b)(1), unless the authorization is terminated or revoked.  Performed at [facility] [place] [address] [place], [place] [address]      RN Pressure Injury Documentation:     Estimated body mass index is 48.9 kg/m as calculated from the following:   Height as of this encounter: 5\' 1"  (1.549 m).   Weight as of this encounter: 117.4 kg.  Malnutrition Type:   Malnutrition Characteristics:   Nutrition Interventions:    Radiology Studies: No results found.  Scheduled Meds: . sodium chloride   Intravenous Once  . aspirin EC  81 mg Oral Daily  . atorvastatin  80 mg Oral QPM  . azithromycin  250 mg Oral Daily  . escitalopram  20 mg Oral Daily  . ferrous sulfate  325 mg Oral BID WC  . fluticasone furoate-vilanterol  1 puff Inhalation Daily  . gabapentin  200  mg Oral BID  . insulin aspart  0-15 Units Subcutaneous TID WC  . insulin aspart  0-5 Units Subcutaneous QHS  . insulin aspart  3 Units Subcutaneous TID WC  . insulin glargine  60 Units Subcutaneous Daily  . ipratropium-albuterol  3 mL  Nebulization TID  . ketoconazole  1 application Topical BID  . metoprolol succinate  25 mg Oral QHS  . pantoprazole  40 mg Oral BID  . rOPINIRole  3 mg Oral TID   Continuous Infusions: . cefTRIAXone (ROCEPHIN)  IV 1 g (07/30/20 0949)    LOS: 3 days    Kerney Elbe, DO [facility] PAGER is on [place]  If 7PM-7AM, please contact night-coverage [email or web address]

## 2020-07-30 NOTE — Progress Notes (Signed)
Marland Kitchen   HEMATOLOGY/ONCOLOGY INPATIENT PROGRESS NOTE  Date of Service: 07/30/2020  Inpatient Attending: .Raiford Noble [place], [place]   SUBJECTIVE  Janet Choi is known to me from oncology clinic.  She was last seen in oncology clinic on 05/16/2020 for pancytopenia.  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  This was causing transfusion dependent anemia, leukopenia/neutropenia and thrombocytopenia.  We had extensive goals of care discussion.  Patient was given IV iron and blood transfusion for temporary supportive care.  She was deemed not to be a good candidate for [place] plus or minus venetoclax given her oxygen dependent COPD, poor functional status CHF and other medical comorbidities.  She was not a candidate for induction chemotherapy or bone marrow transplant. Patient also personally on defining her goals of care does not want to consider chemotherapy. She was offered the option of enrolling in hospice for best supportive cares. She decided that she would want to think about it and let us know but did not give [place] a clear answer on this.  She is now admitted with progressively worsening dyspnea with 15 pound weight gain and also noted to have worsening anemia.  She notes her breathing is a little better with diuresis and she is back on her baseline 2 L/min nasal cannula oxygen.  We were consulted for help with defining goals of care. I had a long discussion with the patient.  She notes that some of her family members would like her to take a lot of vitamins and supplements to "get her stronger".  She notes she is tired of taking all of these significant supplements like [place] super foods and vitamins and everything.  She notes that she is thankful to God that she is not in any pain but did not like the sense of being very short of breath.  She notes that she is really keen on avoiding recurrent  hospitalizations and would want to be kept comfortable. She is clear that she would not want any chemotherapy or involved oncologic intervention.  She is trying to decide if she is at a point where she would want to focus only on best supportive cares through hospice or whether she is still inclined to pursue nonmedical treatments for individual medical issues and possible transfusion support. She notes that some family members want her to "keep fighting".  We discussed that it is really her decision and that everybody should respect this and she appreciated that input.    OBJECTIVE:  NAD  PHYSICAL EXAMINATION: . Vitals:   07/30/20 0510 07/30/20 0700 07/30/20 0814 07/30/20 1317  BP: (!) 140/59   (!) 167/92  Pulse: 68   79  Resp: 20   18  Temp: 98.4 F (36.9 C)   98.6 F (37 C)  TempSrc: Oral   Oral  SpO2: 94%  95% 94%  Weight:  258 lb 13.1 oz (117.4 kg)    Height:       Filed Weights   07/28/20 0500 07/29/20 0536 07/30/20 0700  Weight: 259 lb 11.2 oz (117.8 kg) 261 lb 7.5 oz (118.6 kg) 258 lb 13.1 oz (117.4 kg)   .Body mass index is 48.9 kg/m.  GENERAL:alert, in no acute distress, on oxygen by nasal cannula SKIN: No acute rashes OROPHARYNX:no exudate, no erythema NECK: supple, mild JVD LYMPH:  no palpable lymphadenopathy in the cervical, axillary or inguinal LUNGS: Bilateral decreased breath sounds and  some basal rales. HEART: regular rate & rhythm, 2+ lower extremity edema ABDOMEN: abdomen soft, non-tender, normoactive bowel sounds  Musculoskeletal: no cyanosis of digits and no clubbing  PSYCH: alert & oriented x 3 with fluent speech NEURO: no focal motor/sensory deficits  MEDICAL HISTORY:  Past Medical History:  Diagnosis Date  . Anemia    hx (04/14/2016)  . Anxiety   . Arthritis    "severe in my back; hands; ankles" (04/14/2016)  . Atypical chest pain 01/24/2018   Midline pain absent supine "constant" daytime since 12/31/17 > resolved as of 02/20/2018 on gerd/  gas diet   . Basal cell carcinoma    "several burned off; one cut off"  . CAD in native artery    a. NSTEMI 04/2016 - s/p DES toLAD and LCx. PCI to LCx notable for microembolization during cath.  . Chest pain- reslved with stopping Brilinta now on Plaix 04/16/2016  . Chronic lower back pain   . CKD (chronic kidney disease), stage III (HCC)    stage 3  . Complication of anesthesia   . COPD (chronic obstructive pulmonary disease) ([place])   . Depression   . Diverticulosis   . DJD (degenerative joint disease)   . Family history of adverse reaction to anesthesia   . Gallstones   . GERD (gastroesophageal reflux disease)   . History of gout   . History of kidney stones   . Hyperlipidemia   . Hypertension   . IBS (irritable bowel syndrome)   . Ischemic cardiomyopathy    a. 04/2016: EF 40-50% by cath, 50-55% +WMA by echo.  . 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 ([place]) ~ 2015  . Morbid obesity ([place])   . NSTEMI (non-ST elevated myocardial infarction) ([place]) 04/14/2016  . Obstructive sleep apnea of adult    appt for cpap 8-8 to adjust settings no cpap at this time  . On supplemental oxygen therapy    uses at bedtime or exposed to heat, 2Liters  . Peripheral neuropathy    feet and toes  . Peripheral vascular disease ([place])   . RLS (restless legs syndrome)   . Spinal stenosis   . Type II diabetes mellitus ([place])   . Vitamin D deficiency   . Walking pneumonia   . Wears partial dentures    lower    SURGICAL HISTORY: Past Surgical History:  Procedure Laterality Date  . APPENDECTOMY    . BASAL CELL CARCINOMA EXCISION Left    leg  . BIOPSY  12/25/2019   Procedure: BIOPSY;  Surgeon: Lavena Bullion, DO;  Location: [facility];  Service: Gastroenterology;;  EGD and COLON  . CARDIAC CATHETERIZATION N/A 04/15/2016   Procedure: Left Heart Cath and Coronary Angiography;  Surgeon: Belva Crome, MD;  Location: [facility];  Service: Cardiovascular;  Laterality: N/A;  . CARDIAC  CATHETERIZATION N/A 04/15/2016   Procedure: Coronary Stent Intervention;  Surgeon: Belva Crome, MD;  Location: [facility];  Service: Cardiovascular;  Laterality: N/A;  Mid LAD Mid CFX  . CARPAL TUNNEL RELEASE Right    with trigger finger release  . CATARACT EXTRACTION, BILATERAL Bilateral   . COLONOSCOPY WITH PROPOFOL N/A 12/25/2019   Procedure: COLONOSCOPY WITH PROPOFOL;  Surgeon: Lavena Bullion, DO;  Location: [facility];  Service: Gastroenterology;  Laterality: N/A;  . DEBRIDEMENT TENNIS ELBOW Right   . ESOPHAGOGASTRODUODENOSCOPY (EGD) WITH PROPOFOL N/A 12/25/2019   Procedure: ESOPHAGOGASTRODUODENOSCOPY (EGD) WITH PROPOFOL;  Surgeon: Lavena Bullion, DO;  Location: [facility];  Service: Gastroenterology;  Laterality: N/A;  . LUMBAR DISC SURGERY  X 2  . MELANOMA EXCISION Left    "towards the back of my neck"  . POLYPECTOMY  12/25/2019   Procedure: POLYPECTOMY;  Surgeon: Lavena Bullion, DO;  Location: [facility];  Service: Gastroenterology;;  . SHOULDER ARTHROSCOPY W/ ROTATOR CUFF REPAIR Left   . SHOULDER OPEN ROTATOR CUFF REPAIR Right   . TOTAL KNEE ARTHROPLASTY Bilateral     SOCIAL HISTORY: Social History   Socioeconomic History  . Marital status: Widowed    Spouse name: Not on file  . Number of children: 1  . Years of education: Not on file  . Highest education level: Not on file  Occupational History  . Occupation: retired  Tobacco Use  . Smoking status: Former Smoker    Packs/day: 2.50    Years: 25.00    Pack years: 62.50    Types: Cigarettes    Quit date: 11/11/1983    Years since quitting: 36.7  . Smokeless tobacco: Never Used  Vaping Use  . Vaping Use: Never used  Substance and Sexual Activity  . Alcohol use: No    Alcohol/week: 0.0 standard drinks  . Drug use: No  . Sexual activity: Not Currently  Other Topics Concern  . Not on file  Social History Narrative  . Not on file   Social Determinants of Health   Financial Resource Strain:  Not on file  Food Insecurity: Not on file  Transportation Needs: Not on file  Physical Activity: Not on file  Stress: Not on file  Social Connections: Not on file  Intimate Partner Violence: Not on file    FAMILY HISTORY: Family History  Problem Relation Age of Onset  . Heart disease Mother   . Heart attack Mother   . Kidney disease Father   . AAA (abdominal aortic aneurysm) Father   . Parkinson's disease Father   . Cancer Brother        type unknown  . Heart disease Son   . Cancer Sister        female   . Alzheimer's disease Sister   . Colon cancer Neg Hx   . Colon polyps Neg Hx   . Esophageal cancer Neg Hx   . Pancreatic cancer Neg Hx   . Stomach cancer Neg Hx   . Liver disease Neg Hx   . Diabetes Neg Hx     ALLERGIES:  is allergic to brilinta [ticagrelor], aspirin, nsaids, minocycline hcl, oruvail [ketoprofen], tricor [fenofibrate], vasotec [enalaprilat], and zinc.  MEDICATIONS:  Scheduled Meds: . sodium chloride   Intravenous Once  . aspirin EC  81 mg Oral Daily  . atorvastatin  80 mg Oral QPM  . azithromycin  250 mg Oral Daily  . escitalopram  20 mg Oral Daily  . ferrous sulfate  325 mg Oral BID WC  . fluticasone furoate-vilanterol  1 puff Inhalation Daily  . gabapentin  200 mg Oral BID  . insulin aspart  0-15 Units Subcutaneous TID WC  . insulin aspart  0-5 Units Subcutaneous QHS  . insulin aspart  3 Units Subcutaneous TID WC  . insulin glargine  60 Units Subcutaneous Daily  . ipratropium-albuterol  3 mL Nebulization TID  . ketoconazole  1 application Topical BID  . metoprolol succinate  25 mg Oral QHS  . pantoprazole  40 mg Oral BID  . rOPINIRole  3 mg Oral TID   Continuous Infusions: . cefTRIAXone (ROCEPHIN)  IV 1 g (  07/30/20 0949)   PRN Meds:.acetaminophen **OR** acetaminophen, albuterol, benzonatate, furosemide, HYDROcodone-acetaminophen, ondansetron **OR** ondansetron (ZOFRAN) IV, tiZANidine  REVIEW OF SYSTEMS:    10 Point review of Systems was  done is negative except as noted above.   LABORATORY DATA:  I have reviewed the data as listed  . CBC Latest Ref Rng & Units 07/30/2020 07/29/2020 07/29/2020  WBC 4.0 - 10.5 K/uL 1.5(L) - 1.5(L)  Hemoglobin 12.0 - 15.0 g/dL 8.0(L) 7.8(L) 6.9(LL)  Hematocrit 36.0 - 46.0 % 24.8(L) 25.2(L) 22.9(L)  Platelets 150 - 400 K/uL 108(L) - 105(L)    . CMP Latest Ref Rng & Units 07/30/2020 07/29/2020 07/28/2020  Glucose 70 - 99 mg/dL 289(H) 275(H) 297(H)  BUN 8 - 23 mg/dL 43(H) 37(H) 32(H)  Creatinine 0.44 - 1.00 mg/dL 1.72(H) 1.64(H) 1.83(H)  Sodium 135 - 145 mmol/L 138 141 138  Potassium 3.5 - 5.1 mmol/L 4.3 4.7 3.9  Chloride 98 - 111 mmol/L 103 104 98  CO2 22 - 32 mmol/L $RemoveB'26 28 29  'GGDKXCvy$ Calcium 8.9 - 10.3 mg/dL 8.7(L) 8.9 8.7(L)  Total Protein 6.5 - 8.1 g/dL - - -  Total Bilirubin 0.3 - 1.2 mg/dL - - -  Alkaline Phos 38 - 126 U/L - - -  AST 15 - 41 U/L - - -  ALT 0 - 44 U/L - - -     RADIOGRAPHIC STUDIES: I have personally reviewed the radiological images as listed and agreed with the findings in the report. DG Chest 2 View  Result Date: 07/26/2020 CLINICAL DATA:  Shortness of breath. EXAM: CHEST - 2 VIEW COMPARISON:  Chest x-ray 12/26/2019, CT chest 02/18/2020 FINDINGS: The heart size and mediastinal contours are unchanged. Prominence of the hilar vasculature. Aortic arch calcifications. No focal consolidation. No pulmonary edema. Trace bilateral pleural effusions. No pneumothorax. No acute osseous abnormality. IMPRESSION: Pulmonary edema with bilateral trace pleural effusions. Electronically Signed   By: Iven Finn M.D.   On: 07/26/2020 23:21   CT Angio Chest PE W and/or Wo Contrast  Result Date: 07/27/2020 CLINICAL DATA:  Shortness of breath for 1 week with productive cough, newly diagnosed leukemia EXAM: CT ANGIOGRAPHY CHEST WITH CONTRAST TECHNIQUE: Multidetector CT imaging of the chest was performed using the standard protocol during bolus administration of intravenous contrast.  Multiplanar CT image reconstructions and MIPs were obtained to evaluate the vascular anatomy. CONTRAST:  134mL OMNIPAQUE IOHEXOL 350 MG/ML SOLN COMPARISON:  CT 02/18/2020, 08/28/2019 FINDINGS: Cardiovascular: Central pulmonary arterial contrast bolus is satisfactory but with poor distal opacification further complicated by respiratory motion artifact. No large central or lobar filling defects are identified though more distal assessment is limited by these technical factors. Central pulmonary arteries are normal caliber. Normal heart size. No pericardial effusion. Coronary artery calcifications and coronary stenting is present. The aortic root is suboptimally assessed given cardiac pulsation artifact. Atherosclerotic plaque within the normal caliber aorta. No acute luminal abnormality of the imaged aorta. No periaortic stranding or hemorrhage. Shared origin of the brachiocephalic and left common carotid arteries. Calcifications proximal great vessels. No major venous abnormalities. Mediastinum/Nodes: No mediastinal fluid or gas. Normal thyroid gland and thoracic inlet. No acute abnormality of the trachea or esophagus. Few scattered subcentimeter lymph nodes again seen throughout the mediastinum and hila. No new enlarged or enlarging adenopathy is seen. Lungs/Pleura: Evaluation lung parenchyma limited by respiratory motion artifact. Diffuse mild airways thickening and scattered secretions. Dependent regions of basilar atelectasis. Underlying airspace disease is difficult to fully exclude. No visible effusion or pneumothorax. Stable 6  mm mm basilar nodule in the posterior left lower lobe (5/98). Small left basilar Bochdalek's hernia. Upper Abdomen: Small sliding-type hiatal hernia. No acute abnormalities present in the visualized portions of the upper abdomen. Musculoskeletal: Multilevel degenerative changes are present in the imaged portions of the spine. Dextrocurvature of the midthoracic spine. No acute osseous  abnormality or suspicious osseous lesion. Review of the MIP images confirms the above findings. IMPRESSION: 1. Imaging quality is significantly degraded by respiratory motion artifact. 2. No large central or lobar filling defects are identified though more distal assessment is limited by technical factors. 3. Diffuse mild airways thickening and scattered secretions, could suggest an acute or chronic bronchitis/bronchiolitis. Some additional volume loss noted in the lung bases. Underlying airspace disease is difficult to fully exclude. 4. Stable 6 mm basilar nodule in the posterior left lower lobe. May be post infectious or inflammatory given continued stability. Could consider additional 12 month follow-up CT. Please note typical Fleischner Society guidelines do not apply to this patient with known malignancy. 5. No new enlarged or enlarging adenopathy in the chest or upper abdomen. 6. Small sliding-type hiatal hernia. 7. Aortic Atherosclerosis (ICD10-I70.0). Electronically Signed   By: Lovena Le M.D.   On: 07/27/2020 04:13    ASSESSMENT & PLAN:   80 year old with multiple medical comorbidities including oxygen dependent COPD, diastolic CHF, OSA and AML diagnosed in January 2022 admitted with acute on chronic hypoxic respiratory failure likely due to volume overload plus possible COPD exacerbation plus symptomatic anemia.  #1 Acute myeloid leukemia- [place] vs evaluation from [facility] given chronic cytopenias Diagnosed in January 2022.  Last seen in clinic on 05/16/2020. Patient has symptomatic anemia (hemoglobin 6.9 on admission-now 8 status post transfusion), progressive neutropenia and thrombocytopenia.  #2 acute on chronic hypoxic respiratory failure due to fluid overload, symptomatic anemia and COPD exacerbation. #3 chronic kidney disease #4 UTI  PLAN -Appreciate excellent care from hospital medicine team. -Palliative care is following along as well. -I had a detailed discussion with the patient in  continuing her goals of care discussion from clinic. -She is clear she would not want any chemotherapy or other involved oncologic interventions for her AML.  She understands she is not a candidate for induction chemotherapy or transplant and also chooses not to take the burden of using 5 days plus or minus venetoclax. -She is also not particularly keen on intense ongoing transfusion support and recurrent visits to the cancer center or hospitalization. -Her stated goals of care seems to be moving towards best supportive care through hospice though she is still wondering about what this means with regards to her symptom control and medication management was not quite at the point where she was willing to completely forego transfusion support for symptomatic anemia in the future. -Ongoing goals of care discussion with palliative care team. -No other acute interventions for AML at this time from oncologic stand. -Transfuse as needed for hemoglobin less than 7.5 or if symptomatic  I spent 40 minutes counseling the patient face to face. The total time spent in the appointment was 60 minutes and more than 50% was on counseling and direct patient cares.    Sullivan Lone MD [place] AAHIVMS [facility] Hematology/Oncology Physician [facility]  (Office):       [phone] (Work cell):  [phone] (Fax):           [phone]

## 2020-07-31 LAB — COMPREHENSIVE METABOLIC PANEL
ALT: 15 U/L (ref 0–44)
AST: 14 U/L — ABNORMAL LOW (ref 15–41)
Albumin: 3.2 g/dL — ABNORMAL LOW (ref 3.5–5.0)
Alkaline Phosphatase: 51 U/L (ref 38–126)
Anion gap: 9 (ref 5–15)
BUN: 48 mg/dL — ABNORMAL HIGH (ref 8–23)
CO2: 30 mmol/L (ref 22–32)
Calcium: 8.9 mg/dL (ref 8.9–10.3)
Chloride: 100 mmol/L (ref 98–111)
Creatinine, Ser: 1.56 mg/dL — ABNORMAL HIGH (ref 0.44–1.00)
GFR, Estimated: 34 mL/min — ABNORMAL LOW (ref >60)
Glucose, Bld: 250 mg/dL — ABNORMAL HIGH (ref 70–99)
Potassium: 4.4 mmol/L (ref 3.5–5.1)
Sodium: 139 mmol/L (ref 135–145)
Total Bilirubin: 0.8 mg/dL (ref 0.3–1.2)
Total Protein: 6.2 g/dL — ABNORMAL LOW (ref 6.5–8.1)

## 2020-07-31 LAB — CBC WITH DIFFERENTIAL/PLATELET
Abs Immature Granulocytes: 0 10*3/uL (ref 0.00–0.07)
Basophils Absolute: 0 10*3/uL (ref 0.0–0.1)
Basophils Relative: 0 %
Eosinophils Absolute: 0.1 10*3/uL (ref 0.0–0.5)
Eosinophils Relative: 6 %
HCT: 24.2 % — ABNORMAL LOW (ref 36.0–46.0)
Hemoglobin: 7.5 g/dL — ABNORMAL LOW (ref 12.0–15.0)
Immature Granulocytes: 0 %
Lymphocytes Relative: 37 %
Lymphs Abs: 0.4 10*3/uL — ABNORMAL LOW (ref 0.7–4.0)
MCH: 32.9 pg (ref 26.0–34.0)
MCHC: 31 g/dL (ref 30.0–36.0)
MCV: 106.1 fL — ABNORMAL HIGH (ref 80.0–100.0)
Monocytes Absolute: 0 10*3/uL — ABNORMAL LOW (ref 0.1–1.0)
Monocytes Relative: 4 %
Neutro Abs: 0.6 10*3/uL — ABNORMAL LOW (ref 1.7–7.7)
Neutrophils Relative %: 53 %
Platelets: 116 10*3/uL — ABNORMAL LOW (ref 150–400)
RBC: 2.28 MIL/uL — ABNORMAL LOW (ref 3.87–5.11)
RDW: 18 % — ABNORMAL HIGH (ref 11.5–15.5)
WBC: 1.1 10*3/uL — CL (ref 4.0–10.5)
nRBC: 0 % (ref 0.0–0.2)

## 2020-07-31 LAB — GLUCOSE, CAPILLARY
Glucose-Capillary: 296 mg/dL — ABNORMAL HIGH (ref 70–99)
Glucose-Capillary: 330 mg/dL — ABNORMAL HIGH (ref 70–99)

## 2020-07-31 LAB — MAGNESIUM: Magnesium: 2.3 mg/dL (ref 1.7–2.4)

## 2020-07-31 LAB — PHOSPHORUS: Phosphorus: 4 mg/dL (ref 2.5–4.6)

## 2020-07-31 MED ORDER — GUAIFENESIN ER 600 MG PO TB12: 600.0000 mg | ORAL_TABLET | Freq: Two times a day (BID) | ORAL | 0 refills | Status: AC

## 2020-07-31 MED ORDER — ONDANSETRON HCL 4 MG PO TABS: 4.0000 mg | ORAL_TABLET | Freq: Four times a day (QID) | ORAL | 0 refills | Status: AC | PRN

## 2020-07-31 MED ORDER — HYDROCODONE-ACETAMINOPHEN 5-325 MG PO TABS: 1.0000 | ORAL_TABLET | Freq: Four times a day (QID) | ORAL | 0 refills | Status: DC | PRN

## 2020-07-31 MED ORDER — FUROSEMIDE 10 MG/ML IJ SOLN: 40.0000 mg | Freq: Once | INTRAMUSCULAR | Status: DC

## 2020-07-31 MED ORDER — BENZONATATE 100 MG PO CAPS: 100.0000 mg | ORAL_CAPSULE | Freq: Two times a day (BID) | ORAL | 0 refills | Status: DC | PRN

## 2020-07-31 MED ORDER — IPRATROPIUM-ALBUTEROL 0.5-2.5 (3) MG/3ML IN SOLN: 3.0000 mL | Freq: Three times a day (TID) | RESPIRATORY_TRACT | 0 refills | Status: AC

## 2020-07-31 MED ORDER — GUAIFENESIN ER 600 MG PO TB12
600.0000 mg | ORAL_TABLET | Freq: Two times a day (BID) | ORAL | Status: DC
Start: 2020-07-31 — End: 2020-07-31
  Administered 2020-07-31: 600 mg via ORAL
  Filled 2020-07-31: qty 1

## 2020-07-31 MED ORDER — BASAGLAR KWIKPEN 100 UNIT/ML ~~LOC~~ SOPN: 60.0000 [IU] | PEN_INJECTOR | Freq: Every day | SUBCUTANEOUS | Status: DC

## 2020-07-31 MED ORDER — NITROGLYCERIN 0.4 MG SL SUBL: 0.4000 mg | SUBLINGUAL_TABLET | SUBLINGUAL | 4 refills | Status: DC | PRN

## 2020-07-31 NOTE — Progress Notes (Signed)
PT Cancellation Note  Patient Details Name: Janet Choi MRN: [id number] DOB: 04-18-1941   Cancelled Treatment:    Reason Eval/Treat Not Completed: Patient declined, no reason specified Pt reports just getting back in bed and declines mobility at this time.  Also called out for pain meds due to restless legs, RN bringing to room.  Pt reports possible d/c home today.    LEMYRE,KATHrine E 07/31/2020, 10:48 AM Jannette Spanner PT, DPT Acute Rehabilitation Services Pager: [phone] Office: [phone]

## 2020-07-31 NOTE — Plan of Care (Signed)
Pt to be d/c home with hospice. Pt and visitor given verbal and written discharge instructions and verbalize understanding.  Problem: Education: Goal: Knowledge of General Education information will improve Description: Including pain rating scale, medication(s)/side effects and non-pharmacologic comfort measures Outcome: Adequate for Discharge   Problem: Health Behavior/Discharge Planning: Goal: Ability to manage health-related needs will improve Outcome: Adequate for Discharge   Problem: Clinical Measurements: Goal: Ability to maintain clinical measurements within normal limits will improve Outcome: Adequate for Discharge Goal: Will remain free from infection Outcome: Adequate for Discharge Goal: Diagnostic test results will improve Outcome: Adequate for Discharge Goal: Respiratory complications will improve Outcome: Adequate for Discharge Goal: Cardiovascular complication will be avoided Outcome: Adequate for Discharge   Problem: Activity: Goal: Risk for activity intolerance will decrease Outcome: Adequate for Discharge   Problem: Nutrition: Goal: Adequate nutrition will be maintained Outcome: Adequate for Discharge   Problem: Coping: Goal: Level of anxiety will decrease Outcome: Adequate for Discharge   Problem: Elimination: Goal: Will not experience complications related to bowel motility Outcome: Adequate for Discharge Goal: Will not experience complications related to urinary retention Outcome: Adequate for Discharge   Problem: Pain Managment: Goal: General experience of comfort will improve Outcome: Adequate for Discharge   Problem: Safety: Goal: Ability to remain free from injury will improve Outcome: Adequate for Discharge   Problem: Skin Integrity: Goal: Risk for impaired skin integrity will decrease Outcome: Adequate for Discharge

## 2020-07-31 NOTE — Care Management Important Message (Signed)
Important Message  Patient Details IM Letter given to the Patient. Name: Janet Choi MRN: [id number] Date of Birth: July 22, 1940   Medicare Important Message Given:  Yes     Kerin Salen 07/31/2020, 11:07 AM

## 2020-07-31 NOTE — Progress Notes (Addendum)
Occupational Therapy Treatment Patient Details Name: Janet Choi MRN: [id number] DOB: 08/06/1940 Today's Date: 07/31/2020    History of present illness Pt admitted acute on chronic respiratory failure (2L home O2) and acute on chronic diastolic heart failure.  Pt with hx of COPD, CHF, DM, CKD, CAD, and with new dx of AML.   OT comments  Patient reports deciding to return home with hospice. Treatment focused on education for patient and caregivers in regards to safe return home, how to perform tasks and what equipment may be necessary. Therapist recommended wide BSC, use of a shower chair, and long handled reachers for ease of tasks. Patient reports having LHR x 2 at home. Therapist educated and advices caregivers in regards to use of hospital bed, patient's bed and or use of recliner for sleeping - all as patient tolerates. Therapist recommended use of wedge pillow if patient can't sleep in recliner (patient reports it's "wearing out." Therapist encouarged continued activity as tolerated to maintain mobility but also educated on energy conservation principles as patient reports fatigue the next day after doing "too much." Patient and caregivers verbalized understanding and thankful for therapist's suggestions. OT will sign off as patient reports modified independence with ADLs while in hospital (with toileting, bed mobility) and reporting she is able to stand and make up bed as needed. No further OT needs at this time. .  Follow Up Recommendations  No OT follow up;Supervision - Intermittent    Equipment Recommendations   (wide BSC, shower chair)    Recommendations for Other Services      Precautions / Restrictions Precautions Precautions: Fall Restrictions Weight Bearing Restrictions: No       Mobility Bed Mobility               General bed mobility comments: seated at side of bed when therapist entered the room.    Transfers Overall transfer level: Modified independent                General transfer comment: Per patient she is mod I with mobility and ambulating to bathroom without assistance.    Balance Overall balance assessment: No apparent balance deficits (not formally assessed)                                         ADL either performed or assessed with clinical judgement   ADL                                         General ADL Comments: Patient reports she has been getting up and going to the bathroom independently, making up her bed here in the hospital and standing as needed to settle restless legs.     Vision Patient Visual Report: No change from baseline     Perception     Praxis      Cognition Arousal/Alertness: Awake/alert Behavior During Therapy: WFL for tasks assessed/performed Overall Cognitive Status: Within Functional Limits for tasks assessed                                          Exercises     Shoulder Instructions       General Comments        Pertinent Vitals/ Pain       Pain Assessment: No/denies pain  Home Living                                          Prior Functioning/Environment              Frequency           Progress Toward Goals  OT Goals(current goals can now be found in the care plan section)  Progress towards OT goals: Goals met/education completed, patient discharged from OT     Plan All goals met and education completed, patient discharged from OT services    Co-evaluation                 AM-PAC OT "6 Clicks" Daily Activity     Outcome Measure   Help from another person eating meals?: None Help from another person taking care of personal grooming?: None Help from another person toileting, which includes using toliet, bedpan, or urinal?: None Help from another person bathing (including washing, rinsing, drying)?: None Help from another person to put on and taking off regular upper body  clothing?: None Help from another person to put on and taking off regular lower body clothing?: None 6 Click Score: 24    End of Session Equipment Utilized During Treatment: Oxygen  OT Visit Diagnosis: Other abnormalities of gait and mobility (R26.89)   Activity Tolerance Patient tolerated treatment well   Patient Left in bed;with family/visitor present   Nurse Communication  (okay to see per RN)        Time: 0846-0912 OT Time Calculation (min): 26 min  Charges: OT General Charges $OT Visit: 1 Visit OT Treatments $Self Care/Home Management : 8-22 mins  Janet, OTR/L Acute Care Rehab Services  Office [phone] Pager: [phone]    Janet Choi 07/31/2020, 12:21 PM    

## 2020-07-31 NOTE — Progress Notes (Signed)
   07/31/20 0600  Provider Notification  Provider Name/Title Harrold Donath, MD  Date Provider Notified 07/31/20  Time Provider Notified [phone]  Notification Type Page  Notification Reason Critical result  Test performed and critical result WBC = 1.1  Date Critical Result Received 07/31/20  Time Critical Result Received 0600  Provider response No new orders (Pt WBC baseline is 1.5)  Date of Provider Response 07/31/20  Time of Provider Response [phone]

## 2020-07-31 NOTE — TOC Transition Note (Addendum)
Transition of Care [facility]) - CM/SW Discharge Note   Patient Details  Name: JARETZY LHOMMEDIEU MRN: [id number] Date of Birth: 1940-10-10  Transition of Care [facility]) CM/SW Contact:  Dessa Phi, RN Phone Number: 07/31/2020, 12:52 PM   Clinical Narrative: Referral for home hospice choice-spoke to Patient's deferred contacts-[name](sister)[phone], & Tammie Sherwood(caregiver 24hr)[address] [place]-rep Chrislyn notified-will await outcome of eval-dme needed:hospital bed(does not have to arrive in the home @ d/c,&  bed pan. Family has own transport home.Patient already has home 02 travel tank.  3p-[facility] care rep Chrislyn has accepted. No further CM needs.    Final next level of care: Home w Hospice Care Barriers to Discharge: No Barriers Identified   Patient Goals and CMS Choice Patient states their goals for this hospitalization and ongoing recovery are:: go home CMS Medicare.gov Compare Post Acute Care list provided to:: Patient Represenative (must comment) [name] sister [phone]) Choice offered to / list presented to : Sibling  Discharge Placement                       Discharge Plan and Services   Discharge Planning Services: CM Consult Post Acute Care Choice: Hospice                               Social Determinants of Health (SDOH) Interventions     Readmission Risk Interventions No flowsheet data found.

## 2020-07-31 NOTE — Discharge Summary (Signed)
Physician Discharge Summary  Janet Choi IOX:[id number] DOB: 1940-09-21 DOA: 07/26/2020  PCP: [name], MD  Admit date: 07/26/2020 Discharge date: 07/31/2020  Admitted From: Home Disposition: Home with Hospice  Recommendations for Outpatient Follow-up:  1. Follow up care per Hospice Protocol   Home Health: No Equipment/Devices: None    Discharge Condition: Guarded CODE STATUS: DO NOT RESUSCITATE Diet recommendation: Heart Healthy Carb Modified Diet  Brief/Interim Summary: The patient is a 80 year old morbidly obese Caucasian female with a past medical history significant for but not limited to AML, history of heart failure with preserved ejection fraction, chronic hypoxic respiratory failure who wears 2 L supplemental oxygen via nasal cannula chronically, COPD, OSA, CAD, insulin-dependent diabetes mellitus type 2, CKD stage IIIb as well as other comorbidities who presented to the ER 07/26/2020 with worsening dyspnea.  Patient stated for the last week or so she has been having progressively worsening dyspnea has been compliant with her rescue inhaler and had no significant provement.  She also noted a 15 pound weight gain over last 2 months and recently had to increase her supplemental oxygen.  She was recently seen by her PCP and was advised to start taking Lasix 40 mg daily as scheduled.  Previously she is on as needed Lasix.  She was diagnosed with a UTI and started on Macrobid and then because she progressively had worsening symptoms EMS was activated and EMS found her to be saturating 85% on 2 L of supplemental oxygen.  She was placed on nonrebreather with improvement in her saturations and brought to the ED.  Labs showed hyperglycemia and leukopenia.  CT of the chest was negative for PE but did show bronchitis.  She was admitted for further management and treatment and palliative care and medical oncology were consulted for further goals of care discussion.  Patient has elected to go  home with hospice and she is given diuresis today after her transfusion of her 1 unit of PRBCs yesterday.  Patient still feels volume overloaded so we will need to monitor her strict I's and O's daily weights.  She was given another dose of Lasix today as well as 1 yesterday.  Since she met with hospice and was accepted she will be discharged home with home hospice services today and is stable to be discharged at this time  Discharge Diagnoses:  Active Problems:   Acute on chronic respiratory failure (HCC)   Symptomatic anemia   Counseling regarding advance care planning and goals of care  Acute on chronic hypoxic respiratory failure -Currently requiring about 3 L of O2 which is close to her baseline -Likely 2/2 COPD with bronchitis versus volume overload -CTA chest negative for PE but showed signs of bronchitis -Continue Zithromax, IV ceftriaxone to cover for any pneumonia -SpO2: 94 % O2 Flow Rate (L/min): 2 L/min and is at her Baseline  -Continue DuoNeb 3 mL TID, inhalers Breo Ellipta -Diurese as below -Continuous pulse oximetry maintain O2 saturation greater 90%  -Continue supplemental oxygen via nasal cannula and wean O2 as tolerated to home regimen  Acute on Chronic Diastolic HF -Noted significant weight gain -BNP 237.9  -Noted pulmonary edema on chest x-ray -Troponin with flat trend, EKG showed no acute ST changes -Echo done in 12/2019 showed grade 2 diastolic dysfunction, with preserved EF -Initially held hold Lasix due to rise in creatinine this could be hypoperfusion to the kidneys so we will give her a dose of IV Lasix and give her another dose of IV Lasix today  prior to discharge -Continue with metoprolol succinate 25 mg p.o. nightly -Strict I's and O's, daily weights; patient was positive but now she is +2.6 mL -Weight down 1 lb  -Continue to monitor volume status   AKI on CKD stage IIIb -Mild bump in Creatinine -Her p.o. Lasix was held but will give 1 dose of IV Lasix  given that she appears volume overloaded -She has had a slight increase in her creatinine and BUN/creatinine worsened from last week and went from 28/1.83 ->  32/1.83 -> 37/1.64 -> 43/1.72 -> 48/1.56 -Continue to monitor and trend renal function and diuresis as needed for her dyspnea and swelling -Avoid further nephrotoxic medications, contrast dyes, hypotension and renally dose medications -Repeat CMP in a.m.  History of CAD -Currently chest pain-free -Recent Lipid Panel done on 07/23/20 -Continue aspirin 81 mg p.o. daily, atorvastatin 80 g p.o. nightly, metoprolol succinate 25 mg p.o. nightly -Continue to monitor on telemetry  Hyperlipidemia -Patient is total cholesterol/HDL ratio 6.3, cholesterol was 234, HDL is 37, LDL is 136, non-HDL cholesterol is 197, triglycerides were 397 -Continue with atorvastatin 80 mg daily but may stop [facility]   GERD -Continue with Pantoprazole 40 mg p.o. twice daily  COPD with possible bronchitis -Management as above  E. coli UTI Urine culture grew E. coli -IV Ceftriaxone now discontinued given that she is going to Hospice   History of Chronic Pancytopenia/Macrocytic Anemia History of AML -Hgb drop to 6.9, transfused 1U of PRBC on 07/29/20 -Now her hemoglobin/hematocrit has improved to 7.5/24.2; she continues to remain pancytopenic with a WBC of 1.1 and a platelet count of 116 today -Continue to Monitor -[name] of Medical Oncology consulted and he had a detailed discussion with the patient and continuing her goals of care from clinic and patient is clear that she does not want any chemotherapy or other involved oncological interventions for AML and understands that she is not a candidate for induction chemotherapy or transplant and she has chosen not to take the burden of using 5 days plus or minus of Venetoclax -Per Dr. Grier Mitts discussion she is also not particularly keen on intense ongoing transfusion support and  recurrent visits to the cancer center or hospitalization; per oncology they are recommending no acute interventions for AML at this time but are recommending transfusion for hemoglobin less than 7.5 or if she is symptomatic -Health care consulted for goals of care discussion and patient wants to go home with hospice care and a referral has been made  -Further Care per Hospice Protocol   Diabetes mellitus type 2, uncontrolled with hyperglycemia History of neuropathy -Had a HbA1c 7.9 on 07/23/2020 -C/w 60 units of Lantus sq Daily, Moderate Novolog SSI AC/HS, and 3 Units TIDwm Accu-Cheks, hypoglycemic protocol,  -Continue Gabapentin 200 mg po BID -Continue to monitor blood sugars per protocol and adjust insulin regimen as necessary -CBGs ranging from 181-330  OSA -Not on CPAP -Continue supplemental oxygen as above  Restless leg/Sciatica -Continue Ropinirole 3 mg po TID and Tizanidine 2 mg po qHSprn Muscle Spasms  Morbid Obesity -Complicates overall prognosis and Care -Estimated body mass index is 48.9 kg/m as calculated from the following:   Height as of this encounter: _0  (1.549 m).   Weight as of this encounter: 117.4 kg.. -Weight Loss and Dietary Counseling given   Goals of Care Discussion -Patient with multiple comorbidities, poor prognosis -Confirmed DNR -Requested discussion with palliative team for further goals of care discussion; after patient discussed with  medical oncology and palliative care she wants to go home with hospice care and services -Palliative care has made the hospice referral by the [facility] to be placed in hospice will be seeing the patient tomorrow; likely can be discharged once all equipment has been delivered by hospice  Discharge Instructions  Discharge Instructions    ([place]) Call MD:  Anytime you have any of the following symptoms: 1) 3 pound weight gain in 24 hours or 5 pounds in 1 week 2) shortness of breath, with or without a dry  hacking cough 3) swelling in the hands, feet or stomach 4) if you have to sleep on extra pillows at night in order to breathe.   Complete by: As directed    Call MD for:  difficulty breathing, headache or visual disturbances   Complete by: As directed    Call MD for:  extreme fatigue   Complete by: As directed    Call MD for:  hives   Complete by: As directed    Call MD for:  persistant dizziness or light-headedness   Complete by: As directed    Call MD for:  persistant nausea and vomiting   Complete by: As directed    Call MD for:  redness, tenderness, or signs of infection (pain, swelling, redness, odor or green/yellow discharge around incision site)   Complete by: As directed    Call MD for:  severe uncontrolled pain   Complete by: As directed    Call MD for:  temperature >100.4   Complete by: As directed    Diet - low sodium heart healthy   Complete by: As directed    Diet Carb Modified   Complete by: As directed    Discharge instructions   Complete by: As directed    You were cared for by a hospitalist during your hospital stay. If you have any questions about your discharge medications or the care you received while you were in the hospital after you are discharged, you can call the unit and ask to speak with the hospitalist on call if the hospitalist that took care of you is not available. Once you are discharged, your primary care physician will handle any further medical issues. Please note that NO REFILLS for any discharge medications will be authorized once you are discharged, as it is imperative that you return to your primary care physician (or establish a relationship with a primary care physician if you do not have one) for your aftercare needs so that they can reassess your need for medications and monitor your lab values.  Follow up with Hospice Protocol. Take all medications as prescribed. If symptoms change or worsen please return to the ED for evaluation   For home use  only DME Nebulizer machine   Complete by: As directed    Patient needs a nebulizer to treat with the following condition: COPD (chronic obstructive pulmonary disease) (HCC)   Length of Need: 6 Months   Increase activity slowly   Complete by: As directed      Allergies as of 07/31/2020      Reactions   Brilinta [ticagrelor] Shortness Of Breath, Other (See Comments)   Chest pain (also)   Aspirin Other (See Comments)   Can tolerate in small doses (is already on a blood thinner AND has kidney disease)   Nsaids Other (See Comments)   Patient is taking a blood thinner and has kidney disease   Minocycline Hcl Other (See Comments)  Welts   Oruvail [ketoprofen] Other (See Comments)   Has kidney disease and is taking a blood thinner   Tricor [fenofibrate] Other (See Comments)   Reaction unknown   Vasotec [enalaprilat] Other (See Comments)   Reaction unknown   Zinc Other (See Comments)   Reaction unknown      Medication List    STOP taking these medications   Anoro Ellipta 62.5-25 MCG/INH Aepb Generic drug: umeclidinium-vilanterol   buPROPion 300 MG 24 hr tablet Commonly known as: WELLBUTRIN XL   nitrofurantoin (macrocrystal-monohydrate) 100 MG capsule Commonly known as: Macrobid     TAKE these medications   acetaminophen 500 MG tablet Commonly known as: TYLENOL Take 1,000 mg by mouth every 6 (six) hours as needed for headache (pain).   albuterol 108 (90 Base) MCG/ACT inhaler Commonly known as: Ventolin HFA Inhale 2 puffs into the lungs every 4 (four) hours as needed for wheezing or shortness of breath.   aspirin 81 MG EC tablet Take 1 tablet (81 mg total) by mouth daily.   atorvastatin 80 MG tablet Commonly known as: LIPITOR Take 80 mg by mouth at bedtime.   Basaglar KwikPen 100 UNIT/ML Inject 60 Units into the skin at bedtime. What changed: See the new instructions.   BD Pen Needle Nano 2nd Gen 32G X 4 MM Misc Generic drug: Insulin Pen Needle USE AS DIRECTED 4  TIMES A DAY What changed: See the new instructions.   benzonatate 100 MG capsule Commonly known as: TESSALON Take 1 capsule (100 mg total) by mouth 2 (two) times daily as needed for cough.   BLINK TEARS OP Place 1 drop into both eyes daily as needed (dry eyes).   Breztri Aerosphere 160-9-4.8 MCG/ACT Aero Generic drug: Budeson-Glycopyrrol-Formoterol Inhale 2 puffs into the lungs in the morning and at bedtime.   Cholecalciferol 125 MCG (5000 UT) capsule Take 5,000 Units by mouth 2 (two) times daily.   diclofenac sodium 1 % Gel Commonly known as: VOLTAREN Apply 4 g topically 4 (four) times daily. What changed:   when to take this  reasons to take this   escitalopram 20 MG tablet Commonly known as: Lexapro Take 1 tablet (20 mg total) by mouth daily.   ferrous sulfate 325 (65 FE) MG tablet Take 325 mg by mouth 2 (two) times daily with a meal.   FreeStyle Libre 14 Day Reader Kerrin Mo 1 Device by Does not apply route every 14 (fourteen) days.   FreeStyle Libre 14 Day Sensor Misc Apply 1 application topically every 14 (fourteen) days.   furosemide 40 MG tablet Commonly known as: LASIX Take 40 mg by mouth daily as needed for fluid or edema.   gabapentin 100 MG capsule Commonly known as: NEURONTIN TAKE 1 TO 2 CAPSULES 3 TO 4 X /DAY AS NEEDED FOR PAINFUL DIABETIC NEUROPATHY What changed: See the new instructions.   guaiFENesin 600 MG 12 hr tablet Commonly known as: MUCINEX Take 1 tablet (600 mg total) by mouth 2 (two) times daily for 5 days.   HYDROcodone-acetaminophen 5-325 MG tablet Commonly known as: NORCO/VICODIN Take 1 tablet by mouth every 6 (six) hours as needed for moderate pain.   ipratropium-albuterol 0.5-2.5 (3) MG/3ML Soln Commonly known as: DUONEB Take 3 mLs by nebulization 3 (three) times daily.   ketoconazole 2 % cream Commonly known as: NIZORAL APPLY TO AFFECTED AREA TWICE A DAY What changed: See the new instructions.   metoprolol succinate 25 MG 24  hr tablet Commonly known as: TOPROL-XL Take 1 tablet (25  mg total) by mouth at bedtime.   nitroGLYCERIN 0.4 MG SL tablet Commonly known as: NITROSTAT Place 1 tablet (0.4 mg total) under the tongue every 5 (five) minutes x 3 doses as needed for chest pain.   NovoLOG FlexPen 100 UNIT/ML FlexPen Generic drug: insulin aspart INJECT 50 TO 75 UNITS 3 X /DAY BEFORE MEALS AS DIRECTED FOR DIABETES What changed: See the new instructions.   nystatin 100000 UNIT/ML suspension Commonly known as: MYCOSTATIN 5 ml four times a day, retain in mouth as long as possible (Swish and Swallow).  Use for 48 hours after symptoms resolve. What changed:   how much to take  how to take this  when to take this  additional instructions   ondansetron 4 MG tablet Commonly known as: ZOFRAN Take 1 tablet (4 mg total) by mouth every 6 (six) hours as needed for nausea.   onetouch ultrasoft lancets Use as instructed   OneTouch Verio test strip Generic drug: glucose blood USE AS INSTRUCTED TO CHECK SUGAR 2 TIMES DAILY. What changed: See the new instructions.   OXYGEN Inhale 2 L into the lungs continuous.   pantoprazole 40 MG tablet Commonly known as: Protonix Take 1 tablet (40 mg total) by mouth 2 (two) times daily as needed. For acid reflux.   rOPINIRole 3 MG tablet Commonly known as: REQUIP TAKE 1 TABLET 4 TIMES A DAY FOR RESTLESS LEGS What changed:   how much to take  how to take this  when to take this  additional instructions   tiZANidine 4 MG tablet Commonly known as: ZANAFLEX Take 2 mg by mouth at bedtime as needed for muscle spasms.   triamcinolone 55 MCG/ACT Aero nasal inhaler Commonly known as: NASACORT Place 2 sprays into the nose at bedtime. What changed: when to take this   Victoza 18 MG/3ML Sopn Generic drug: liraglutide Inject 1.2 mg into the skin every morning. What changed: when to take this   vitamin B-12 1000 MCG tablet Commonly known as: CYANOCOBALAMIN Take  1,000 mcg by mouth daily.   vitamin C 1000 MG tablet Take 1,000 mg by mouth 2 (two) times daily.   vitamin E 180 MG (400 UNITS) capsule Take 400 Units by mouth daily.            Durable Medical Equipment  (From admission, onward)         Start     Ordered   07/31/20 0000  For home use only DME Nebulizer machine       Question Answer Comment  Patient needs a nebulizer to treat with the following condition COPD (chronic obstructive pulmonary disease) ([place])   Length of Need 6 Months      07/31/20 1332          Follow-up Information    [facility] up.   Specialty: PALLIATIVE CARE Why: Home nurse Contact information: [place] [address] [phone]             Allergies  Allergen Reactions  . Brilinta [Ticagrelor] Shortness Of Breath and Other (See Comments)    Chest pain (also)  . Aspirin Other (See Comments)    Can tolerate in small doses (is already on a blood thinner AND has kidney disease)  . Nsaids Other (See Comments)    Patient is taking a blood thinner and has kidney disease  . Minocycline Hcl Other (See Comments)    Welts  . Oruvail [Ketoprofen] Other (See Comments)    Has kidney  disease and is taking a blood thinner  . Tricor [Fenofibrate] Other (See Comments)    Reaction unknown  . Vasotec [Enalaprilat] Other (See Comments)    Reaction unknown  . Zinc Other (See Comments)    Reaction unknown   Consultations:  Medical Oncology  Palliative Care Medicine  Procedures/Studies: DG Chest 2 View  Result Date: 07/26/2020 CLINICAL DATA:  Shortness of breath. EXAM: CHEST - 2 VIEW COMPARISON:  Chest x-ray 12/26/2019, CT chest 02/18/2020 FINDINGS: The heart size and mediastinal contours are unchanged. Prominence of the hilar vasculature. Aortic arch calcifications. No focal consolidation. No pulmonary edema. Trace bilateral pleural effusions. No pneumothorax. No acute osseous abnormality. IMPRESSION:  Pulmonary edema with bilateral trace pleural effusions. Electronically Signed   By: Iven Finn M.D.   On: 07/26/2020 23:21   CT Angio Chest PE W and/or Wo Contrast  Result Date: 07/27/2020 CLINICAL DATA:  Shortness of breath for 1 week with productive cough, newly diagnosed leukemia EXAM: CT ANGIOGRAPHY CHEST WITH CONTRAST TECHNIQUE: Multidetector CT imaging of the chest was performed using the standard protocol during bolus administration of intravenous contrast. Multiplanar CT image reconstructions and MIPs were obtained to evaluate the vascular anatomy. CONTRAST:  156m OMNIPAQUE IOHEXOL 350 MG/ML SOLN COMPARISON:  CT 02/18/2020, 08/28/2019 FINDINGS: Cardiovascular: Central pulmonary arterial contrast bolus is satisfactory but with poor distal opacification further complicated by respiratory motion artifact. No large central or lobar filling defects are identified though more distal assessment is limited by these technical factors. Central pulmonary arteries are normal caliber. Normal heart size. No pericardial effusion. Coronary artery calcifications and coronary stenting is present. The aortic root is suboptimally assessed given cardiac pulsation artifact. Atherosclerotic plaque within the normal caliber aorta. No acute luminal abnormality of the imaged aorta. No periaortic stranding or hemorrhage. Shared origin of the brachiocephalic and left common carotid arteries. Calcifications proximal great vessels. No major venous abnormalities. Mediastinum/Nodes: No mediastinal fluid or gas. Normal thyroid gland and thoracic inlet. No acute abnormality of the trachea or esophagus. Few scattered subcentimeter lymph nodes again seen throughout the mediastinum and hila. No new enlarged or enlarging adenopathy is seen. Lungs/Pleura: Evaluation lung parenchyma limited by respiratory motion artifact. Diffuse mild airways thickening and scattered secretions. Dependent regions of basilar atelectasis. Underlying  airspace disease is difficult to fully exclude. No visible effusion or pneumothorax. Stable 6 mm mm basilar nodule in the posterior left lower lobe (5/98). Small left basilar Bochdalek's hernia. Upper Abdomen: Small sliding-type hiatal hernia. No acute abnormalities present in the visualized portions of the upper abdomen. Musculoskeletal: Multilevel degenerative changes are present in the imaged portions of the spine. Dextrocurvature of the midthoracic spine. No acute osseous abnormality or suspicious osseous lesion. Review of the MIP images confirms the above findings. IMPRESSION: 1. Imaging quality is significantly degraded by respiratory motion artifact. 2. No large central or lobar filling defects are identified though more distal assessment is limited by technical factors. 3. Diffuse mild airways thickening and scattered secretions, could suggest an acute or chronic bronchitis/bronchiolitis. Some additional volume loss noted in the lung bases. Underlying airspace disease is difficult to fully exclude. 4. Stable 6 mm basilar nodule in the posterior left lower lobe. May be post infectious or inflammatory given continued stability. Could consider additional 12 month follow-up CT. Please note typical Fleischner Society guidelines do not apply to this patient with known malignancy. 5. No new enlarged or enlarging adenopathy in the chest or upper abdomen. 6. Small sliding-type hiatal hernia. 7. Aortic Atherosclerosis (ICD10-I70.0). Electronically Signed  By: Lovena Le M.D.   On: 07/27/2020 04:13     Subjective: Seen and examined at bedside and she was back on her home O2 regimen but still felt that she had some swelling in her legs.  No nausea or vomiting.  Think she is doing okay.  Shortness breath is stable.  She denies any other concerns or complaints at this time and has accepted that she wants to go to home with home hospice.  Family updated at bedside and she is stable be discharged at this  time.  Discharge Exam: Vitals:   07/31/20 0818 07/31/20 1445  BP:  120/62  Pulse:  79  Resp:    Temp:  98.9 F (37.2 C)  SpO2: 90% 94%   Vitals:   07/30/20 2029 07/31/20 0456 07/31/20 0818 07/31/20 1445  BP: 103/83 116/66  120/62  Pulse: 79 66  79  Resp: 18 20    Temp: 98 F (36.7 C) 98.2 F (36.8 C)  98.9 F (37.2 C)  TempSrc: Oral   Oral  SpO2: 92% 95% 90% 94%  Weight:      Height:       General: Pt is alert, awake, not in acute distress Cardiovascular: RRR, S1/S2 +, no rubs, no gallops Respiratory: Diminished bilaterally, no wheezing, no rhonchi; Unlabored breathing wearing 2 Liters Abdominal: Soft, NT, Distended 2/2 body habitus, bowel sounds + Extremities: 1+ LE edema, no cyanosis  The results of significant diagnostics from this hospitalization (including imaging, microbiology, ancillary and laboratory) are listed below for reference.    Microbiology: Recent Results (from the past 240 hour(s))  Urine Culture     Status: Abnormal   Collection Time: 07/23/20  3:38 PM  Result Value Ref Range Status   MICRO NUMBER: [id number]  Final   SPECIMEN QUALITY: Adequate  Final   Sample Source URINE  Final   STATUS: FINAL  Final   ISOLATE 1: Escherichia coli (A)  Final    Comment: Greater than 100,000 CFU/mL of Escherichia coli      Susceptibility   Escherichia coli - URINE CULTURE, REFLEX    AMOX/CLAVULANIC >=32 Resistant     AMPICILLIN >=32 Resistant     AMPICILLIN/SULBACTAM >=32 Resistant     CEFAZOLIN* 16 Resistant      * For uncomplicated UTI caused by E. coli,K. pneumoniae or P. mirabilis: Cefazolin issusceptible if MIC <32 mcg/mL and predictssusceptible to the oral agents cefaclor, cefdinir,cefpodoxime, cefprozil, cefuroxime, cephalexinand loracarbef.    CEFEPIME <=1 Sensitive     CEFTRIAXONE <=1 Sensitive     CIPROFLOXACIN <=0.25 Sensitive     LEVOFLOXACIN <=0.12 Sensitive     ERTAPENEM <=0.5 Sensitive     GENTAMICIN <=1 Sensitive     IMIPENEM <=0.25 Sensitive      NITROFURANTOIN 32 Sensitive     PIP/TAZO <=4 Sensitive     TOBRAMYCIN <=1 Sensitive     TRIMETH/SULFA* <=20 Sensitive      * For uncomplicated UTI caused by E. coli,K. pneumoniae or P. mirabilis: Cefazolin issusceptible if MIC <32 mcg/mL and predictssusceptible to the oral agents cefaclor, cefdinir,cefpodoxime, cefprozil, cefuroxime, cephalexinand loracarbef.Legend:S = Susceptible  I = IntermediateR = Resistant  NS = Not susceptible* = Not tested  NR = Not reported**NN = See antimicrobic comments  Resp Panel by RT-PCR (Flu A&B, Covid) Nasopharyngeal Swab     Status: None   Collection Time: 07/27/20  2:55 AM   Specimen: Nasopharyngeal Swab; Nasopharyngeal(NP) swabs in vial transport medium  Result Value Ref Range Status  SARS Coronavirus 2 by RT PCR NEGATIVE NEGATIVE Final    Comment: (NOTE) SARS-CoV-2 target nucleic acids are NOT DETECTED.  The SARS-CoV-2 RNA is generally detectable in upper respiratory specimens during the acute phase of infection. The lowest concentration of SARS-CoV-2 viral copies this assay can detect is 138 copies/mL. A negative result does not preclude SARS-Cov-2 infection and should not be used as the sole basis for treatment or other patient management decisions. A negative result may occur with  improper specimen collection/handling, submission of specimen other than nasopharyngeal swab, presence of viral mutation(s) within the areas targeted by this assay, and inadequate number of viral copies(<138 copies/mL). A negative result must be combined with clinical observations, patient history, and epidemiological information. The expected result is Negative.  Fact Sheet for Patients:  EntrepreneurPulse.com.au  Fact Sheet for Healthcare Providers:  IncredibleEmployment.be  This test is no t yet approved or cleared by the [place] FDA and  has been authorized for detection and/or diagnosis of SARS-CoV-2 by FDA under an  Emergency Use Authorization (EUA). This EUA will remain  in effect (meaning this test can be used) for the duration of the COVID-19 declaration under Section 564(b)(1) of the Act, 21 U.S.C.section 360bbb-3(b)(1), unless the authorization is terminated  or revoked sooner.       Influenza A by PCR NEGATIVE NEGATIVE Final   Influenza B by PCR NEGATIVE NEGATIVE Final    Comment: (NOTE) The Xpert Xpress SARS-CoV-2/FLU/RSV plus assay is intended as an aid in the diagnosis of influenza from Nasopharyngeal swab specimens and should not be used as a sole basis for treatment. Nasal washings and aspirates are unacceptable for Xpert Xpress SARS-CoV-2/FLU/RSV testing.  Fact Sheet for Patients: EntrepreneurPulse.com.au  Fact Sheet for Healthcare Providers: IncredibleEmployment.be  This test is not yet approved or cleared by the [place] FDA and has been authorized for detection and/or diagnosis of SARS-CoV-2 by FDA under an Emergency Use Authorization (EUA). This EUA will remain in effect (meaning this test can be used) for the duration of the COVID-19 declaration under Section 564(b)(1) of the Act, 21 U.S.C. section 360bbb-3(b)(1), unless the authorization is terminated or revoked.  Performed at [facility], [place] [address] [place], [place] [address]     Labs: BNP (last 3 results) Recent Labs    07/26/20 2323  BNP 300.7*   Basic Metabolic Panel: Recent Labs  Lab 07/27/20 0955 07/28/20 0507 07/29/20 0556 07/30/20 0515 07/31/20 0511  NA 139 138 141 138 139  K 3.7 3.9 4.7 4.3 4.4  CL 100 98 104 103 100  CO2 _0 GLUCOSE 201* 297* 275* 289* 250*  BUN 27* 32* 37* 43* 48*  CREATININE 1.34* 1.83* 1.64* 1.72* 1.56*  CALCIUM 8.8* 8.7* 8.9 8.7* 8.9  MG 2.0  --   --   --  2.3  PHOS 3.8  --   --   --  4.0   Liver Function Tests: Recent Labs  Lab 07/26/20 2323 07/31/20 0511  AST 16 14*  ALT 16 15  ALKPHOS  56 51  BILITOT 0.4 0.8  PROT 6.3* 6.2*  ALBUMIN 3.5 3.2*   No results for input(s): LIPASE, AMYLASE in the last 168 hours. No results for input(s): AMMONIA in the last 168 hours. CBC: Recent Labs  Lab 07/27/20 0955 07/28/20 0507 07/29/20 0556 07/29/20 1747 07/30/20 0651 07/31/20 0511  WBC 1.7* 1.5* 1.5*  --  1.5* 1.1*  NEUTROABS 0.9* 0.9* 1.0*  --  1.0* 0.6*  HGB 7.4* 7.4* 6.9* 7.8* 8.0* 7.5*  HCT 24.0* 24.4* 22.9* 25.2* 24.8* 24.2*  MCV 110.6* 109.9* 111.7*  --  102.9* 106.1*  PLT 100* 109* 105*  --  108* 116*   Cardiac Enzymes: No results for input(s): CKTOTAL, CKMB, CKMBINDEX, TROPONINI in the last 168 hours. BNP: Invalid input(s): POCBNP CBG: Recent Labs  Lab 07/30/20 1144 07/30/20 1641 07/30/20 2028 07/31/20 0739 07/31/20 1100  GLUCAP 264* 181* 221* 296* 330*   D-Dimer No results for input(s): DDIMER in the last 72 hours. Hgb A1c No results for input(s): HGBA1C in the last 72 hours. Lipid Profile No results for input(s): CHOL, HDL, LDLCALC, TRIG, CHOLHDL, LDLDIRECT in the last 72 hours. Thyroid function studies No results for input(s): TSH, T4TOTAL, T3FREE, THYROIDAB in the last 72 hours.  Invalid input(s): FREET3 Anemia work up No results for input(s): VITAMINB12, FOLATE, FERRITIN, TIBC, IRON, RETICCTPCT in the last 72 hours. Urinalysis    Component Value Date/Time   COLORURINE DARK YELLOW 07/23/2020 1538   APPEARANCEUR CLEAR 07/23/2020 1538   LABSPEC 1.019 07/23/2020 1538   PHURINE < OR = 5.0 07/23/2020 1538   GLUCOSEU NEGATIVE 07/23/2020 1538   HGBUR TRACE (A) 07/23/2020 1538   BILIRUBINUR (A) 12/28/2019 1153    TEST NOT REPORTED DUE TO COLOR INTERFERENCE OF URINE PIGMENT   KETONESUR NEGATIVE 07/23/2020 1538   PROTEINUR 2+ (A) 07/23/2020 1538   UROBILINOGEN 0.2 08/27/2013 1427   NITRITE (A) 12/28/2019 1153    TEST NOT REPORTED DUE TO COLOR INTERFERENCE OF URINE PIGMENT   LEUKOCYTESUR (A) 12/28/2019 1153    TEST NOT REPORTED DUE TO COLOR  INTERFERENCE OF URINE PIGMENT   Sepsis Labs Invalid input(s): PROCALCITONIN,  WBC,  LACTICIDVEN Microbiology Recent Results (from the past 240 hour(s))  Urine Culture     Status: Abnormal   Collection Time: 07/23/20  3:38 PM  Result Value Ref Range Status   MICRO NUMBER: [id number]  Final   SPECIMEN QUALITY: Adequate  Final   Sample Source URINE  Final   STATUS: FINAL  Final   ISOLATE 1: Escherichia coli (A)  Final    Comment: Greater than 100,000 CFU/mL of Escherichia coli      Susceptibility   Escherichia coli - URINE CULTURE, REFLEX    AMOX/CLAVULANIC >=32 Resistant     AMPICILLIN >=32 Resistant     AMPICILLIN/SULBACTAM >=32 Resistant     CEFAZOLIN* 16 Resistant      * For uncomplicated UTI caused by E. coli,K. pneumoniae or P. mirabilis: Cefazolin issusceptible if MIC <32 mcg/mL and predictssusceptible to the oral agents cefaclor, cefdinir,cefpodoxime, cefprozil, cefuroxime, cephalexinand loracarbef.    CEFEPIME <=1 Sensitive     CEFTRIAXONE <=1 Sensitive     CIPROFLOXACIN <=0.25 Sensitive     LEVOFLOXACIN <=0.12 Sensitive     ERTAPENEM <=0.5 Sensitive     GENTAMICIN <=1 Sensitive     IMIPENEM <=0.25 Sensitive     NITROFURANTOIN 32 Sensitive     PIP/TAZO <=4 Sensitive     TOBRAMYCIN <=1 Sensitive     TRIMETH/SULFA* <=20 Sensitive      * For uncomplicated UTI caused by E. coli,K. pneumoniae or P. mirabilis: Cefazolin issusceptible if MIC <32 mcg/mL and predictssusceptible to the oral agents cefaclor, cefdinir,cefpodoxime, cefprozil, cefuroxime, cephalexinand loracarbef.Legend:S = Susceptible  I = IntermediateR = Resistant  NS = Not susceptible* = Not tested  NR = Not reported**NN = See antimicrobic comments  Resp Panel by RT-PCR (Flu A&B, Covid) Nasopharyngeal Swab     Status: None  Collection Time: 07/27/20  2:55 AM   Specimen: Nasopharyngeal Swab; Nasopharyngeal(NP) swabs in vial transport medium  Result Value Ref Range Status   SARS Coronavirus 2 by RT PCR NEGATIVE NEGATIVE  Final    Comment: (NOTE) SARS-CoV-2 target nucleic acids are NOT DETECTED.  The SARS-CoV-2 RNA is generally detectable in upper respiratory specimens during the acute phase of infection. The lowest concentration of SARS-CoV-2 viral copies this assay can detect is 138 copies/mL. A negative result does not preclude SARS-Cov-2 infection and should not be used as the sole basis for treatment or other patient management decisions. A negative result may occur with  improper specimen collection/handling, submission of specimen other than nasopharyngeal swab, presence of viral mutation(s) within the areas targeted by this assay, and inadequate number of viral copies(<138 copies/mL). A negative result must be combined with clinical observations, patient history, and epidemiological information. The expected result is Negative.  Fact Sheet for Patients:  EntrepreneurPulse.com.au  Fact Sheet for Healthcare Providers:  IncredibleEmployment.be  This test is no t yet approved or cleared by the [place] FDA and  has been authorized for detection and/or diagnosis of SARS-CoV-2 by FDA under an Emergency Use Authorization (EUA). This EUA will remain  in effect (meaning this test can be used) for the duration of the COVID-19 declaration under Section 564(b)(1) of the Act, 21 U.S.C.section 360bbb-3(b)(1), unless the authorization is terminated  or revoked sooner.       Influenza A by PCR NEGATIVE NEGATIVE Final   Influenza B by PCR NEGATIVE NEGATIVE Final    Comment: (NOTE) The Xpert Xpress SARS-CoV-2/FLU/RSV plus assay is intended as an aid in the diagnosis of influenza from Nasopharyngeal swab specimens and should not be used as a sole basis for treatment. Nasal washings and aspirates are unacceptable for Xpert Xpress SARS-CoV-2/FLU/RSV testing.  Fact Sheet for Patients: EntrepreneurPulse.com.au  Fact Sheet for Healthcare  Providers: IncredibleEmployment.be  This test is not yet approved or cleared by the [place] FDA and has been authorized for detection and/or diagnosis of SARS-CoV-2 by FDA under an Emergency Use Authorization (EUA). This EUA will remain in effect (meaning this test can be used) for the duration of the COVID-19 declaration under Section 564(b)(1) of the Act, 21 U.S.C. section 360bbb-3(b)(1), unless the authorization is terminated or revoked.  Performed at [facility], [place] [address] [place], [place] [address]    Time coordinating discharge: 35 minutes  SIGNED:  Kerney Elbe, DO [facility] 07/31/2020, 8:11 PM Pager is on [place]  If 7PM-7AM, please contact night-coverage [email or web address]

## 2020-07-31 NOTE — Progress Notes (Signed)
Manufacturing engineer [facility])  Received request from [facility] for hospice services at home after discharge.  Chart and pt information under review by [facility] physician.  Hospice eligibility pending at this time.  Hospital liaison spoke with pt's sister [name] to initiate education related to hospice philosophy and services and to answer any questions at this time.  [name] verbalized understanding of information given.  Per discussion the plan is to discharge home today by private vehicle.    [place] send signed and completed DNR home with pt/family.  Please provide prescriptions at discharge as needed to ensure ongoing symptom management.    DME needs discussed.  Pt currently has O2 through [employer].  BSC to be ordered, but will not delay discharge.  Address has been verified and is correct in the chart.  ACC information and contact numbers given to [place].  Above information shared with [place] Alpha Manager.  Please call with any questions or concerns.  Thank you for the opportunity to participate in this pt's care.  Domenic Moras, BSN, RN [employer] [phone] (24h on call)

## 2020-08-05 ENCOUNTER — Other Ambulatory Visit: Payer: Self-pay | Admitting: Internal Medicine

## 2020-08-05 DIAGNOSIS — Z794 Long term (current) use of insulin: Secondary | ICD-10-CM

## 2020-08-05 DIAGNOSIS — E1142 Type 2 diabetes mellitus with diabetic polyneuropathy: Secondary | ICD-10-CM

## 2020-08-05 DIAGNOSIS — IMO0002 Reserved for concepts with insufficient information to code with codable children: Secondary | ICD-10-CM

## 2020-08-05 DIAGNOSIS — E1122 Type 2 diabetes mellitus with diabetic chronic kidney disease: Secondary | ICD-10-CM

## 2020-09-03 ENCOUNTER — Other Ambulatory Visit: Payer: Self-pay | Admitting: Internal Medicine

## 2020-09-03 MED ORDER — DEXAMETHASONE 4 MG PO TABS: ORAL_TABLET | ORAL | 0 refills | Status: DC

## 2020-09-03 MED ORDER — AZITHROMYCIN 250 MG PO TABS: ORAL_TABLET | ORAL | 0 refills | Status: DC

## 2020-09-15 ENCOUNTER — Ambulatory Visit: Payer: PPO | Admitting: Adult Health

## 2020-09-16 ENCOUNTER — Ambulatory Visit: Payer: PPO | Admitting: Physician Assistant

## 2020-09-29 ENCOUNTER — Encounter: Payer: Self-pay | Admitting: Hematology

## 2020-09-30 ENCOUNTER — Other Ambulatory Visit: Payer: Self-pay

## 2020-09-30 ENCOUNTER — Ambulatory Visit (INDEPENDENT_AMBULATORY_CARE_PROVIDER_SITE_OTHER): Payer: PPO | Admitting: Adult Health

## 2020-09-30 ENCOUNTER — Encounter: Payer: Self-pay | Admitting: Adult Health

## 2020-09-30 VITALS — BP 116/64 | HR 80 | Temp 97.7°F | Wt 265.0 lb

## 2020-09-30 DIAGNOSIS — N183 Chronic kidney disease, stage 3 unspecified: Secondary | ICD-10-CM

## 2020-09-30 DIAGNOSIS — C9200 Acute myeloblastic leukemia, not having achieved remission: Secondary | ICD-10-CM | POA: Diagnosis not present

## 2020-09-30 DIAGNOSIS — L03115 Cellulitis of right lower limb: Secondary | ICD-10-CM | POA: Diagnosis not present

## 2020-09-30 DIAGNOSIS — E1122 Type 2 diabetes mellitus with diabetic chronic kidney disease: Secondary | ICD-10-CM

## 2020-09-30 DIAGNOSIS — L989 Disorder of the skin and subcutaneous tissue, unspecified: Secondary | ICD-10-CM | POA: Diagnosis not present

## 2020-09-30 MED ORDER — BENZONATATE 100 MG PO CAPS: 100.0000 mg | ORAL_CAPSULE | Freq: Three times a day (TID) | ORAL | 2 refills | Status: DC | PRN

## 2020-09-30 MED ORDER — DOXYCYCLINE HYCLATE 100 MG PO CAPS: ORAL_CAPSULE | ORAL | 1 refills | Status: DC

## 2020-09-30 NOTE — Progress Notes (Signed)
Assessment and Plan:  Janet Choi was seen today for blister.  Diagnoses and all orders for this visit:  Skin lesion of right leg See attached photo; cannot r/o skin cancer However with current hospice status she declines referral to derm for biopsy/resection, minimal interventions requested ? - Cellulitis of right lower extremity Hx of simlar improved with keflex, unknown renal status, recent zpak will give doxycycline instead - 2 weeks, with refill PRN Caregiver and home health/hospice to monitor  Follow up if not improving/resolving If growing lesion with pain may still refer to skin surgery center to resect for comfort -     doxycycline (VIBRAMYCIN) 100 MG capsule; Take 1 capsule 2 x/day with food for 14 days.  Acute myeloid leukemia not having achieved remission (HCC) -     CBC with Differential/Platelet  CKD stage 3 due to type 2 diabetes mellitus (HCC) -     COMPLETE METABOLIC PANEL WITH GFR  Other orders -     benzonatate (TESSALON) 100 MG capsule; Take 1 capsule (100 mg total) by mouth 3 (three) times daily as needed for cough.  Further disposition pending results of labs. Discussed med's effects and SE's.   Over 15 minutes of exam, counseling, chart review, and critical decision making was performed.   Future Appointments  Date Time Provider [place]  10/23/2020  2:30 PM Liane Comber, NP [facility] None  07/23/2021  2:00 PM McClanahan, Danton Sewer, NP [facility] None    ------------------------------------------------------------------------------------------------------------------   HPI BP 116/64   Pulse 80   Temp 97.7 F (36.5 C)   Wt 265 lb (120.2 kg)   SpO2 95%   BMI 50.07 kg/m   80 y.o.female with hx of melanoma, recent acute myeloid leukemia on hospice presents accompanied by her caregiver Annie Sable, now lives with her for leg lesion "wound." Extensive heart hx, chronic resp failure on 2L [place] O2. On hospice with Dr. Rico Sheehan with [facility]. Home  health nurse comes by weekly.   She reports long history of intermittent puritic leg lesions/rash without firm diagnosis, has never seen derm. 2 months ago started having flare, has been doing vistaril PRN QID, camphor cream with benefit for itching, also vaseline per hospice recommendation but notes progressive and with R leg tenderness and erythema in the last week.   She reports in the past similar leg lesions have improved on keflex. Notes recent URI and was on zpak and decadron taper without improvement. Has hydrocodone via hospice PRN pain.   She has not been getting labs due to hospice, does request CBC and CMP check today.   Lab Results  Component Value Date   GFRNONAA 34 (L) 07/31/2020   GFRNONAA 30 (L) 07/30/2020   GFRNONAA 32 (L) 07/29/2020   CBC Latest Ref Rng & Units 07/31/2020 07/30/2020 07/29/2020  WBC 4.0 - 10.5 K/uL 1.1(LL) 1.5(L) -  Hemoglobin 12.0 - 15.0 g/dL 7.5(L) 8.0(L) 7.8(L)  Hematocrit 36.0 - 46.0 % 24.2(L) 24.8(L) 25.2(L)  Platelets 150 - 400 K/uL 116(L) 108(L) -     Past Medical History:  Diagnosis Date  . Anemia    hx (04/14/2016)  . Anxiety   . Arthritis    "severe in my back; hands; ankles" (04/14/2016)  . Atypical chest pain 01/24/2018   Midline pain absent supine "constant" daytime since 12/31/17 > resolved as of 02/20/2018 on gerd/ gas diet   . Basal cell carcinoma    "several burned off; one cut off"  . CAD in native artery    a. NSTEMI  04/2016 - s/p DES toLAD and LCx. PCI to LCx notable for microembolization during cath.  . Chest pain- reslved with stopping Brilinta now on Plaix 04/16/2016  . Chronic lower back pain   . CKD (chronic kidney disease), stage III (HCC)    stage 3  . Complication of anesthesia   . COPD (chronic obstructive pulmonary disease) ([place])   . Depression   . Diverticulosis   . DJD (degenerative joint disease)   . Family history of adverse reaction to anesthesia   . Gallstones   . GERD (gastroesophageal reflux disease)   .  History of gout   . History of kidney stones   . Hyperlipidemia   . Hypertension   . IBS (irritable bowel syndrome)   . Ischemic cardiomyopathy    a. 04/2016: EF 40-50% by cath, 50-55% +WMA by echo.  . 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 ([place]) ~ 2015  . Morbid obesity ([place])   . NSTEMI (non-ST elevated myocardial infarction) ([place]) 04/14/2016  . Obstructive sleep apnea of adult    appt for cpap 8-8 to adjust settings no cpap at this time  . On supplemental oxygen therapy    uses at bedtime or exposed to heat, 2Liters  . Peripheral neuropathy    feet and toes  . Peripheral vascular disease ([place])   . RLS (restless legs syndrome)   . Spinal stenosis   . Type II diabetes mellitus ([place])   . Vitamin D deficiency   . Walking pneumonia   . Wears partial dentures    lower     Allergies  Allergen Reactions  . Brilinta [Ticagrelor] Shortness Of Breath and Other (See Comments)    Chest pain (also)  . Aspirin Other (See Comments)    Can tolerate in small doses (is already on a blood thinner AND has kidney disease)  . Nsaids Other (See Comments)    Patient is taking a blood thinner and has kidney disease  . Minocycline Hcl Other (See Comments)    Welts  . Oruvail [Ketoprofen] Other (See Comments)    Has kidney disease and is taking a blood thinner  . Tricor [Fenofibrate] Other (See Comments)    Reaction unknown  . Vasotec [Enalaprilat] Other (See Comments)    Reaction unknown  . Zinc Other (See Comments)    Reaction unknown    Current Outpatient Medications on File Prior to Visit  Medication Sig  . acetaminophen (TYLENOL) 500 MG tablet Take 1,000 mg by mouth every 6 (six) hours as needed for headache (pain).  Marland Kitchen albuterol (VENTOLIN HFA) 108 (90 Base) MCG/ACT inhaler Inhale 2 puffs into the lungs every 4 (four) hours as needed for wheezing or shortness of breath.  . BD PEN NEEDLE NANO 2ND GEN 32G X 4 MM MISC USE AS DIRECTED 4 TIMES A DAY (Patient taking differently: 1 each by  Other route in the morning, at noon, in the evening, and at bedtime.)  . benzonatate (TESSALON) 100 MG capsule Take 1 capsule (100 mg total) by mouth 2 (two) times daily as needed for cough.  . Budeson-Glycopyrrol-Formoterol (BREZTRI AEROSPHERE) 160-9-4.8 MCG/ACT AERO Inhale 2 puffs into the lungs in the morning and at bedtime.  . Cholecalciferol 125 MCG (5000 UT) capsule Take 5,000 Units by mouth 2 (two) times daily.   . Continuous Blood Gluc Receiver (FREESTYLE LIBRE 14 DAY READER) DEVI 1 Device by Does not apply route every 14 (fourteen) days.  . Continuous Blood Gluc Sensor (FREESTYLE LIBRE 14 DAY SENSOR) MISC  APPLY 1 APPLICATION TOPICALLY EVERY 14 (FOURTEEN) DAYS.  Marland Kitchen diclofenac sodium (VOLTAREN) 1 % GEL Apply 4 g topically 4 (four) times daily. (Patient taking differently: Apply 4 g topically 2 (two) times daily as needed (pain).)  . escitalopram (LEXAPRO) 20 MG tablet Take 1 tablet (20 mg total) by mouth daily.  . ferrous sulfate 325 (65 FE) MG tablet Take 325 mg by mouth 2 (two) times daily with a meal.  . furosemide (LASIX) 40 MG tablet Take 40 mg by mouth daily as needed for fluid or edema.  . gabapentin (NEURONTIN) 100 MG capsule TAKE 1 TO 2 CAPSULES 3 TO 4 X /DAY AS NEEDED FOR PAINFUL DIABETIC NEUROPATHY (Patient taking differently: Take 200 mg by mouth 2 (two) times daily. for Painful Diabetic Neuropathy)  . HYDROcodone-acetaminophen (NORCO/VICODIN) 5-325 MG tablet Take 1 tablet by mouth every 6 (six) hours as needed for moderate pain.  . Insulin Glargine (BASAGLAR KWIKPEN) 100 UNIT/ML Inject 60 Units into the skin at bedtime.  Marland Kitchen ipratropium-albuterol (DUONEB) 0.5-2.5 (3) MG/3ML SOLN Take 3 mLs by nebulization 3 (three) times daily.  Marland Kitchen ketoconazole (NIZORAL) 2 % cream APPLY TO AFFECTED AREA TWICE A DAY (Patient taking differently: Apply 1 application topically 2 (two) times daily. For facial itching)  . Lancets (ONETOUCH ULTRASOFT) lancets Use as instructed  . liraglutide (VICTOZA) 18  MG/3ML SOPN Inject 1.2 mg into the skin every morning. (Patient taking differently: Inject 1.2 mg into the skin daily at 12 noon.)  . metoprolol succinate (TOPROL-XL) 25 MG 24 hr tablet Take 1 tablet (25 mg total) by mouth at bedtime.  . nitroGLYCERIN (NITROSTAT) 0.4 MG SL tablet Place 1 tablet (0.4 mg total) under the tongue every 5 (five) minutes x 3 doses as needed for chest pain.  Marland Kitchen NOVOLOG FLEXPEN 100 UNIT/ML FlexPen INJECT 50 TO 75 UNITS 3 X /DAY BEFORE MEALS AS DIRECTED FOR DIABETES (Patient taking differently: Inject 20-25 Units into the skin See admin instructions. Inject 20 units subcutaneously three times daily before meals, if CBG >130 increase to 25 units)  . nystatin (MYCOSTATIN) 100000 UNIT/ML suspension 5 ml four times a day, retain in mouth as long as possible (Swish and Swallow).  Use for 48 hours after symptoms resolve. (Patient taking differently: Use as directed 5 mLs in the mouth or throat See admin instructions. Take 5 ml by mouth four times a day, retain in mouth as long as possible (Swish and Swallow).  Use for 48 hours after symptoms resolve.)  . ondansetron (ZOFRAN) 4 MG tablet Take 1 tablet (4 mg total) by mouth every 6 (six) hours as needed for nausea.  Glory Rosebush VERIO test strip USE AS INSTRUCTED TO CHECK SUGAR 2 TIMES DAILY. (Patient taking differently: 1 each by Other route in the morning and at bedtime.)  . OXYGEN Inhale 2 L into the lungs continuous.  . pantoprazole (PROTONIX) 40 MG tablet Take 1 tablet (40 mg total) by mouth 2 (two) times daily as needed. For acid reflux.  . Polyethylene Glycol 400 (BLINK TEARS OP) Place 1 drop into both eyes daily as needed (dry eyes).   Marland Kitchen rOPINIRole (REQUIP) 3 MG tablet TAKE 1 TABLET 4 TIMES A DAY FOR RESTLESS LEGS (Patient taking differently: Take 6 mg by mouth 2 (two) times daily.)  . tiZANidine (ZANAFLEX) 4 MG tablet Take 2 mg by mouth at bedtime as needed for muscle spasms.  Marland Kitchen triamcinolone (NASACORT) 55 MCG/ACT AERO nasal  inhaler Place 2 sprays into the nose at bedtime. (Patient taking differently:  Place 2 sprays into the nose 2 (two) times daily.)  . Ascorbic Acid (VITAMIN C) 1000 MG tablet Take 1,000 mg by mouth 2 (two) times daily. (Patient not taking: Reported on 09/30/2020)  . aspirin EC 81 MG EC tablet Take 1 tablet (81 mg total) by mouth daily. (Patient not taking: Reported on 09/30/2020)  . atorvastatin (LIPITOR) 80 MG tablet Take 80 mg by mouth at bedtime. (Patient not taking: Reported on 09/30/2020)  . azithromycin (ZITHROMAX) 250 MG tablet Take 2 tablets with Food on  Day 1, then 1 tablet Daily with Food for Sinusitis / Bronchitis (Patient not taking: Reported on 09/30/2020)  . dexamethasone (DECADRON) 4 MG tablet Take 1 tab 3 x day - 3 days, then 2 x day - 3 days, then 1 tab daily (Patient not taking: Reported on 09/30/2020)  . vitamin B-12 (CYANOCOBALAMIN) 1000 MCG tablet Take 1,000 mcg by mouth daily. (Patient not taking: Reported on 09/30/2020)  . vitamin E 180 MG (400 UNITS) capsule Take 400 Units by mouth daily. (Patient not taking: Reported on 09/30/2020)   No current facility-administered medications on file prior to visit.    ROS: all negative except above.   Physical Exam:  BP 116/64   Pulse 80   Temp 97.7 F (36.5 C)   Wt 265 lb (120.2 kg)   SpO2 95%   BMI 50.07 kg/m   General Appearance: Well nourished/well dressed obese elderly female in no acute distress, on [place] portable O2.  Eyes: PERRLA, conjunctiva no swelling or erythema ENT/Mouth: Ext aud canals clear, TMs without erythema, bulging. No erythema, swelling, or exudate on post pharynx.  Tonsils not swollen or erythematous. Hearing normal.  Neck: Supple Respiratory: Respiratory effort increased after ambulation, BS equal bilaterally without rales, rhonchi, wheezing or stridor.  Cardio: RRR with no MRGs. Tenderness in ankles, pulses intact, mild non-pitting edema.  Abdomen: Soft, + BS.  Non tender. Lymphatics: Non tender without  lymphadenopathy.  Musculoskeletal: No obvious deformity, no joint effusion, slow waddling gait.  Skin: Bil lower legs with scaly/leathery appearance, irregular texture, scattered smooth white deposites in skin of bil ankles; R lateral leg with keratotic lesion approx 2 cm, with increased subtle erythema of leg, generalized tenderness bil lower legs. No open lesions, discharge. No local fluctuance.  Neuro: Cranial nerves intact. Normal muscle tone, no cerebellar symptoms. Sensation intact.  Psych: Awake and oriented X 3, normal affect, Insight and Judgment appropriate.        Izora Ribas, NP 3:48 PM [facility]

## 2020-09-30 NOTE — Patient Instructions (Signed)
Doxycycline tablets or capsules What is this medicine? DOXYCYCLINE (dox i SYE kleen) is a tetracycline antibiotic. It kills certain bacteria or stops their growth. It is used to treat many kinds of infections, like dental, skin, respiratory, and urinary tract infections. It also treats acne, Lyme disease, malaria, and certain sexually transmitted infections. This medicine may be used for other purposes; ask your health care provider or pharmacist if you have questions. COMMON BRAND NAME(S): Acticlate, Adoxa, Adoxa CK, Adoxa Pak, Adoxa TT, Alodox, Avidoxy, Doxal, LYMEPAK, Mondoxyne NL, Monodox, Morgidox 1x, Morgidox 1x Kit, Morgidox 2x, Morgidox 2x Kit, NutriDox, Ocudox, Okebo, Periostat, TARGADOX, Vibra-Tabs, Vibramycin What should I tell my health care provider before I take this medicine? They need to know if you have any of these conditions:  liver disease  long exposure to sunlight like working outdoors  stomach problems like colitis  an unusual or allergic reaction to doxycycline, tetracycline antibiotics, other medicines, foods, dyes, or preservatives  pregnant or trying to get pregnant  breast-feeding How should I use this medicine? Take this medicine by mouth with a full glass of water. Follow the directions on the prescription label. It is best to take this medicine without food, but if it upsets your stomach take it with food. Take your medicine at regular intervals. Do not take your medicine more often than directed. Take all of your medicine as directed even if you think you are better. Do not skip doses or stop your medicine early. Talk to your pediatrician regarding the use of this medicine in children. While this drug may be prescribed for selected conditions, precautions do apply. Overdosage: If you think you have taken too much of this medicine contact a poison control center or emergency room at once. NOTE: This medicine is only for you. Do not share this medicine with  others. What if I miss a dose? If you miss a dose, take it as soon as you can. If it is almost time for your next dose, take only that dose. Do not take double or extra doses. What may interact with this medicine?  antacids  barbiturates  birth control pills  bismuth subsalicylate  carbamazepine  methoxyflurane  other antibiotics  phenytoin  vitamins that contain iron  warfarin This list may not describe all possible interactions. Give your health care provider a list of all the medicines, herbs, non-prescription drugs, or dietary supplements you use. Also tell them if you smoke, drink alcohol, or use illegal drugs. Some items may interact with your medicine. What should I watch for while using this medicine? Tell your doctor or health care professional if your symptoms do not improve. Do not treat diarrhea with over the counter products. Contact your doctor if you have diarrhea that lasts more than 2 days or if it is severe and watery. Do not take this medicine just before going to bed. It may not dissolve properly when you lay down and can cause pain in your throat. Drink plenty of fluids while taking this medicine to also help reduce irritation in your throat. This medicine can make you more sensitive to the sun. Keep out of the sun. If you cannot avoid being in the sun, wear protective clothing and use sunscreen. Do not use sun lamps or tanning beds/booths. Birth control pills may not work properly while you are taking this medicine. Talk to your doctor about using an extra method of birth control. If you are being treated for a sexually transmitted infection, avoid sexual contact   avoid sexual contact until you have finished your treatment. Your sexual partner may also need treatment. Avoid antacids, aluminum, calcium, magnesium, and iron products for 4 hours before and 2 hours after taking a dose of this medicine. If you are using this medicine to prevent malaria, you should still protect  yourself from contact with mosquitos. Stay in screened-in areas, use mosquito nets, keep your body covered, and use an insect repellent. What side effects may I notice from receiving this medicine? Side effects that you should report to your doctor or health care professional as soon as possible:  allergic reactions like skin rash, itching or hives, swelling of the face, lips, or tongue  difficulty breathing  fever  itching in the rectal or genital area  pain on swallowing  rash, fever, and swollen lymph nodes  redness, blistering, peeling or loosening of the skin, including inside the mouth  severe stomach pain or cramps  unusual bleeding or bruising  unusually weak or tired  yellowing of the eyes or skin Side effects that usually do not require medical attention (report to your doctor or health care professional if they continue or are bothersome):  diarrhea  loss of appetite  nausea, vomiting This list may not describe all possible side effects. Call your doctor for medical advice about side effects. You may report side effects to FDA at 1-800-FDA-1088. Where should I keep my medicine? Keep out of the reach of children. Store at room temperature, below 30 degrees C (86 degrees F). Protect from light. Keep container tightly closed. Throw away any unused medicine after the expiration date. Taking this medicine after the expiration date can make you seriously ill. NOTE: This sheet is a summary. It may not cover all possible information. If you have questions about this medicine, talk to your doctor, pharmacist, or health care provider.  2021 Elsevier/Gold Standard (2018-07-27 13:44:53)  

## 2020-10-01 LAB — COMPLETE METABOLIC PANEL WITH GFR
AG Ratio: 2.1 (calc) (ref 1.0–2.5)
ALT: 14 U/L (ref 6–29)
AST: 16 U/L (ref 10–35)
Albumin: 4 g/dL (ref 3.6–5.1)
Alkaline phosphatase (APISO): 72 U/L (ref 37–153)
BUN/Creatinine Ratio: 19 (calc) (ref 6–22)
BUN: 26 mg/dL — ABNORMAL HIGH (ref 7–25)
CO2: 33 mmol/L — ABNORMAL HIGH (ref 20–32)
Calcium: 9.1 mg/dL (ref 8.6–10.4)
Chloride: 101 mmol/L (ref 98–110)
Creat: 1.34 mg/dL — ABNORMAL HIGH (ref 0.60–0.93)
GFR, Est African American: 44 mL/min/{1.73_m2} — ABNORMAL LOW (ref >=60)
GFR, Est Non African American: 38 mL/min/{1.73_m2} — ABNORMAL LOW (ref >=60)
Globulin: 1.9 g/dL (calc) (ref 1.9–3.7)
Glucose, Bld: 165 mg/dL — ABNORMAL HIGH (ref 65–99)
Potassium: 3.9 mmol/L (ref 3.5–5.3)
Sodium: 143 mmol/L (ref 135–146)
Total Bilirubin: 0.4 mg/dL (ref 0.2–1.2)
Total Protein: 5.9 g/dL — ABNORMAL LOW (ref 6.1–8.1)

## 2020-10-01 LAB — CBC WITH DIFFERENTIAL/PLATELET
Absolute Monocytes: 30 cells/uL — ABNORMAL LOW (ref 200–950)
Basophils Absolute: 10 cells/uL (ref 0–200)
Basophils Relative: 0.8 %
Eosinophils Absolute: 30 cells/uL (ref 15–500)
Eosinophils Relative: 2.3 %
HCT: 28.9 % — ABNORMAL LOW (ref 35.0–45.0)
Hemoglobin: 9 g/dL — ABNORMAL LOW (ref 11.7–15.5)
Lymphs Abs: 579 cells/uL — ABNORMAL LOW (ref 850–3900)
MCH: 29.4 pg (ref 27.0–33.0)
MCHC: 31.1 g/dL — ABNORMAL LOW (ref 32.0–36.0)
MCV: 94.4 fL (ref 80.0–100.0)
MPV: 10.7 fL (ref 7.5–12.5)
Monocytes Relative: 2.3 %
Neutro Abs: 651 cells/uL — ABNORMAL LOW (ref 1500–7800)
Neutrophils Relative %: 50.1 %
Platelets: 115 10*3/uL — ABNORMAL LOW (ref 140–400)
RBC: 3.06 10*6/uL — ABNORMAL LOW (ref 3.80–5.10)
RDW: 18.2 % — ABNORMAL HIGH (ref 11.0–15.0)
Total Lymphocyte: 44.5 %
WBC: 1.3 10*3/uL — ABNORMAL LOW (ref 3.8–10.8)

## 2020-10-02 ENCOUNTER — Ambulatory Visit: Payer: PPO | Admitting: Internal Medicine

## 2020-10-06 ENCOUNTER — Encounter: Payer: Self-pay | Admitting: Hematology

## 2020-10-07 ENCOUNTER — Other Ambulatory Visit: Payer: Self-pay | Admitting: Adult Health

## 2020-10-07 DIAGNOSIS — L989 Disorder of the skin and subcutaneous tissue, unspecified: Secondary | ICD-10-CM

## 2020-10-08 ENCOUNTER — Other Ambulatory Visit: Payer: Self-pay | Admitting: Adult Health

## 2020-10-08 MED ORDER — TRIAMCINOLONE ACETONIDE 0.1 % EX OINT: 1.0000 "application " | TOPICAL_OINTMENT | Freq: Two times a day (BID) | CUTANEOUS | 1 refills | Status: DC

## 2020-10-15 ENCOUNTER — Other Ambulatory Visit: Payer: Self-pay | Admitting: Adult Health

## 2020-10-15 ENCOUNTER — Other Ambulatory Visit: Payer: Self-pay

## 2020-10-15 DIAGNOSIS — Z794 Long term (current) use of insulin: Secondary | ICD-10-CM

## 2020-10-15 DIAGNOSIS — IMO0002 Reserved for concepts with insufficient information to code with codable children: Secondary | ICD-10-CM

## 2020-10-15 DIAGNOSIS — E1142 Type 2 diabetes mellitus with diabetic polyneuropathy: Secondary | ICD-10-CM

## 2020-10-15 MED ORDER — FREESTYLE LIBRE 14 DAY READER DEVI: 1.0000 | 4 refills | Status: DC

## 2020-10-15 MED ORDER — FREESTYLE LIBRE 14 DAY SENSOR MISC: 1.0000 "application " | 4 refills | Status: DC

## 2020-10-15 MED ORDER — NOVOLOG FLEXPEN 100 UNIT/ML ~~LOC~~ SOPN: 20.0000 [IU] | PEN_INJECTOR | SUBCUTANEOUS | 3 refills | Status: DC

## 2020-10-15 MED ORDER — BASAGLAR KWIKPEN 100 UNIT/ML ~~LOC~~ SOPN: 60.0000 [IU] | PEN_INJECTOR | Freq: Every day | SUBCUTANEOUS | Status: DC

## 2020-10-15 MED ORDER — VICTOZA 18 MG/3ML ~~LOC~~ SOPN: 1.2000 mg | PEN_INJECTOR | Freq: Every day | SUBCUTANEOUS | 3 refills | Status: DC

## 2020-10-16 MED ORDER — FREESTYLE LIBRE 2 SENSOR MISC: 2 refills | Status: DC

## 2020-10-23 ENCOUNTER — Ambulatory Visit: Payer: PPO | Admitting: Adult Health

## 2020-11-04 ENCOUNTER — Encounter: Payer: Self-pay | Admitting: Internal Medicine

## 2020-11-04 ENCOUNTER — Ambulatory Visit (INDEPENDENT_AMBULATORY_CARE_PROVIDER_SITE_OTHER): Payer: PPO | Admitting: Internal Medicine

## 2020-11-04 ENCOUNTER — Other Ambulatory Visit: Payer: Self-pay

## 2020-11-04 VITALS — BP 122/72 | HR 81 | Temp 97.9°F | Resp 17 | Ht 61.0 in | Wt 272.2 lb

## 2020-11-04 DIAGNOSIS — J302 Other seasonal allergic rhinitis: Secondary | ICD-10-CM | POA: Diagnosis not present

## 2020-11-04 DIAGNOSIS — R609 Edema, unspecified: Secondary | ICD-10-CM

## 2020-11-04 DIAGNOSIS — E1140 Type 2 diabetes mellitus with diabetic neuropathy, unspecified: Secondary | ICD-10-CM | POA: Diagnosis not present

## 2020-11-04 DIAGNOSIS — L281 Prurigo nodularis: Secondary | ICD-10-CM

## 2020-11-04 MED ORDER — METOLAZONE 10 MG PO TABS: ORAL_TABLET | ORAL | 0 refills | Status: AC

## 2020-11-04 MED ORDER — IPRATROPIUM BROMIDE 0.06 % NA SOLN
NASAL | 3 refills | Status: AC
Start: 2020-11-04 — End: ?

## 2020-11-04 MED ORDER — GABAPENTIN 300 MG PO CAPS: ORAL_CAPSULE | ORAL | 3 refills | Status: AC

## 2020-11-04 NOTE — Progress Notes (Signed)
Future Appointments  Date Time Provider [place]  12/04/2020  4:00 PM [name], MD [facility] None  07/23/2021  2:00 PM Garnet Sierras, NP [facility] None    History of Present Illness:     Patient is a very nice 80 yo DWF with multiple co-morbidities including HTN, IDDM  & Ac ML (electing no treatment) who presents with concerns of  worsening or persistent bilat LE edema with a chronic "lumpy" type texture to the skin.  Has chronic c/o burning dysthesias of th distal LE. She also has c/o watery nasal congestion.    Medications  Current Outpatient Medications (Endocrine & Metabolic):    NOVOLOG FlexPen,  Inject 20 units three times daily before meals   BASAGLAR KWIKPEN, Inject 60 Units  at bedtime.   VICTOZA 18 MG/3ML SOPN, Inject 1.2 mg daily at 12 noon.   furosemide 40 MG tablet, Take daily as needed for fluid or edema.   metolazone  10 MG tablet, Take 1/2 to 1 tablet  Daily for Edema   metoprolol succinate-XL 25 MG , Take 1 tablet at bedtime.   NITROSTAT 0.4 MG tablet, as needed for chest pain.    albuterol HFA inhaler, Inhale 2 puffs very 4 hours as needed    benzonatate 100 MG capsule, Take 1 capsule\3  times daily as needed    ipratropium 0.06 % nasal spray, Use 1 to 2 sprays 2 to 3 x /day as needed   DUONEB 0.5-2.5 MG/3ML SOLN, Take 3 mLs by neb 3 times daily.   NASACORT nasal inhaler Place 2 sprays into the nose 2 times daily   acetaminophen  500 MG tablet, Take 1,000 mg every 6 hours as needed    VICODIN 5-325 MG tablet, Take 1 tablet  every 6 hours as needed    ferrous sulfate 325 MG tablet, Take  2 times daily with a meal.   VITAMIN C 1000 MG , Take 2 (times daily.   Vit D 5000 UT) capsule, Take 5,000 Units by mouth 2 (two) times daily.    diclofenac 1 % GEL, Apply 4 g topically 4 times daily   escitalopram  20 MG tablet, Take 1 tablet (20 mg total) by mouth daily.   gabapentin 300 MG capsule, Take  1 capsule   3  x /day     hydrOXYzine 25 mg, Take  1 tablet 4  times daily as needed.    ketoconazole 2 % cream, Apply  2 times daily. For facial itching)   nystatin 100,000 UNIT susp 5 ml four times Swish and Swallow     ondansetron 4 MG tablet, Take 1 tablet every 6 hours as needed for nausea.   OXYGEN, Inhale 2 L into the lungs continuous.   pantoprazole 40 MG tablet, Take 1 tablet 2 (two) times daily as needed. For acid reflux.   rOPINIRole3 MG tablet, Take 6 mg 2 times daily.)   tiZANidine 4 MG tablet, Take 2 mg  at bedtime as needed   KENALOG 0.1 %, Apply 1 application topically 2  times daily.   Problem list She has Hyperlipidemia associated with type 2 diabetes mellitus ([place]); Essential hypertension; Diverticulosis of large intestine; History of colonic polyps; Morbid obesity due to excess calories ([place]); Vitamin D deficiency; Medication management; RLS (restless legs syndrome); Type 2 diabetes mellitus with hyperlipidemia (HCC); OSA and COPD overlap syndrome ([place]); Iron deficiency anemia; History of non-ST elevation myocardial infarction (NSTEMI); CKD stage 3 due to type 2 diabetes mellitus ([place]);  CAD S/P percutaneous coronary angioplasty; DJD (degenerative joint disease), lumbosacral; Upper airway cough syndrome; Diabetic polyneuropathy associated with type 2 diabetes mellitus (HCC); DJD (degenerative joint disease) of knee; Acute on chronic diastolic CHF (congestive heart failure) ([place]); Superior mesenteric artery stenosis ([place]); 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 ([place]); Insulin dependent type 2 diabetes mellitus ([place]); Solitary pulmonary nodule; Chronic bronchitis ([place]); Long term (current) use of insulin ([place]); Pancreatic insufficiency; Pancytopenia ([place]); Chronic diarrhea; Anemia; Thrombocytopenia ([place]); Neutropenia ([place]); Diverticulosis of colon without hemorrhage; Polyp of ascending colon; Polyp of sigmoid colon; Tortuous colon; Gastritis and gastroduodenitis; Hiatal hernia; Acute pulmonary edema ([place]); Acute on  chronic respiratory failure with hypoxia ([place]); Acute diastolic heart failure ([place]); AKI (acute kidney injury) ([place]); Obesity, Class III, BMI 40-49.9 (morbid obesity) ([place]); Hypotension due to hypovolemia; Acute lower UTI; E. 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   Observations/Objective:  BP 122/72   Pulse 81   Temp 97.9 F (36.6 C)   Resp 17   Ht 5\' 1"  (1.549 m)   Wt 272 lb 3.2 oz (123.5 kg)   SpO2 93%   BMI 51.43 kg/m   HEENT - WNL. Neck - supple.  Chest - Clear equal BS. Cor - Nl HS. RRR w/o sig MGR. PP obscured by 2-3+ ankle /pedal edema edema. MS- FROM w/o deformities.  Gait Nl. Neuro -  Nl w/o focal abnormalities. Skin  - of lower extremities has moderate pachyderma with a lumpy texture. No signs of erythema, cellulitis or lymphangitis.  Assessment and Plan:   1. Painful diabetic neuropathy (HCC)  - gabapentin 300 MG capsule; Take  1 capsule   3  x /day   Disp: 270 caps; Refill: 3  2. Prurigo nodularis   3. Edema  - metolazone 10 MG tablet; Take 1/2 to 1 tablet  Daily Disp: 90 tablet; Refill: 0  4. Seasonal allergic rhinitis  - ipratropium 0.06 % nasal sp; Use 1 to 2 sprays each nostril 2 to 3 x /day as needed  Disp: 45 mL; Refill: 3   Follow Up Instructions:      I discussed the assessment and treatment plan with the patient. The patient was provided an opportunity to ask questions and all were answered. The patient agreed with the plan and demonstrated an understanding of the instructions.        The patient was advised to call back or seek an in-person evaluation if the symptoms worsen or if the condition fails to improve as anticipated.   Kirtland Bouchard, MD

## 2020-11-04 NOTE — Patient Instructions (Addendum)
Diabetic Neuropathy  Take Gabapentin 300 mg  Diabetic neuropathy refers to nerve damage that is caused by diabetes. Over time, people with diabetes can develop nerve damage throughout the body. There are several types of diabetic neuropathy: Peripheral neuropathy. This is the most common type of diabetic neuropathy. It damages the nerves that carry signals between the spinal cord and other parts of the body (peripheral nerves). This usually affects nerves in the feet, legs, hands, and arms. Autonomic neuropathy. This type causes damage to nerves that control involuntary functions (autonomic nerves). Involuntary functions are functions of the body that you do not control. They include heartbeat, body temperature, blood pressure, urination, digestion, sweating, sexual function, or response to changes in blood glucose. Focal neuropathy. This type of nerve damage affects one area of the body, such as an arm, a leg, or the face. The injury may involve one nerve or a small group of nerves. Focal neuropathy can be painful and unpredictable. It occurs most often in older adults with diabetes. This often develops suddenly, but usually improves over time and does not cause long-term problems. Proximal neuropathy. This type of nerve damage affects the nerves of the thighs, hips, buttocks, or legs. It causes severe pain, weakness, and muscle death (atrophy), usually in the thigh muscles. It is more common among older men and people who have type 2 diabetes. The length of recovery time may vary. What are the causes? Peripheral, autonomic, and focal neuropathies are caused by diabetes that is not well controlled with treatment. The cause of proximal neuropathy is not known, but it may be caused by inflammation related to uncontrolled bloodglucose levels. What are the signs or symptoms? Peripheral neuropathy Peripheral neuropathy develops slowly over time. When the nerves of the feet and legs no longer work, you may  experience: Burning, stabbing, or aching pain in the legs or feet. Pain or cramping in the legs or feet. Loss of feeling (numbness) and inability to feel pressure or pain in the feet. This can lead to: Thick calluses or sores on areas of constant pressure. Ulcers. Reduced ability to feel temperature changes. Foot deformities. Muscle weakness. Loss of balance or coordination. Autonomic neuropathy The symptoms of autonomic neuropathy vary depending on which nerves are affected. Symptoms may include: Problems with digestion, such as: Nausea or vomiting. Poor appetite. Bloating. Diarrhea or constipation. Trouble swallowing. Losing weight without trying to. Problems with the heart, blood, and lungs, such as: Dizziness, especially when standing up. Fainting. Shortness of breath. Irregular heartbeat. Bladder problems, such as: Trouble starting or stopping urination. Leaking urine. Trouble emptying the bladder. Urinary tract infections (UTIs). Problems with other body functions, such as: Sweat. You may sweat too much or too little. Temperature. You might get hot easily. Or, you might feel cold more than usual. Sexual function. Men may not be able to get or maintain an erection. Women may have vaginal dryness and difficulty with arousal. Focal neuropathy Symptoms affect only one area of the body. Common symptoms include: Numbness. Tingling. Burning pain. Prickling feeling. Very sensitive skin. Weakness. Inability to move (paralysis). Muscle twitching. Muscles getting smaller (wasting). Poor coordination. Double or blurred vision. Proximal neuropathy Sudden, severe pain in the hip, thigh, or buttocks. Pain may spread from the back into the legs (sciatica). Pain and numbness in the arms and legs. Tingling. Loss of bladder control or bowel control. Weakness and wasting of thigh muscles. Difficulty getting up from a seated position. Abdominal swelling. Unexplained weight  loss. How is this diagnosed? Diagnosis  varies depending on the type of neuropathy your health care providersuspects. Peripheral neuropathy Your health care provider will do a neurologic exam. This exam checks your reflexes, how you move, and what you can feel. You may have other tests, such as: Blood tests. Tests of the fluid that surrounds the spinal cord (lumbar puncture). CT scan. MRI. Checking the nerves that control muscles (electromyogram, or EMG). Checking how quickly signals pass through your nerves (nerve conduction study). Checking a small piece of a nerve using a microscope (biopsy). Autonomic neuropathy You may have tests, such as: Tests to measure your blood pressure and heart rate. You may be secured to an exam table that moves you from a lying position to an upright position (table tilt test). Breathing tests to check your lungs. Tests to check how food moves through the digestive system (gastric emptying tests). Blood, sweat, or urine tests. Ultrasound of your bladder. Spinal fluid tests. Focal neuropathy This condition may be diagnosed with: A neurologic exam. CT scan. MRI. EMG. Nerve conduction study. Proximal neuropathy There is no test to diagnose this type of neuropathy. You may have tests to rule out other possible causes of this type of neuropathy. Tests may include: X-rays of your spine and lumbar region. Lumbar puncture. MRI. How is this treated? The goal of treatment is to keep nerve damage from getting worse. Treatment may include: Following your diabetes management plan. This will help keep your blood glucose level and your A1C level within your target range. This is the most important treatment. Using prescription pain medicine. Follow these instructions at home: Diabetes management Follow your diabetes management plan as told by your health care provider. Check your blood glucose levels. Keep your blood glucose in your target range. Have your A1C  level checked at least two times a year, or as often as told. Take over the counter and prescription medicines only as told by your health care provider. This includes insulin and diabetes medicine.  Lifestyle  Do not use any products that contain nicotine or tobacco, such as cigarettes, e-cigarettes, and chewing tobacco. If you need help quitting, ask your health care provider. Be physically active every day. Include strength training and balance exercises. Follow a healthy meal plan. Work with your health care provider to manage your blood pressure.  General instructions Ask your health care provider if the medicine prescribed to you requires you to avoid driving or using machinery. Check your skin and feet every day for cuts, bruises, redness, blisters, or sores. Keep all follow-up visits. This is important. Contact a health care provider if: You have burning, stabbing, or aching pain in your legs or feet. You are unable to feel pressure or pain in your feet. You develop problems with digestion, such as: Nausea. Vomiting. Bloating. Constipation. Diarrhea. Abdominal pain. You have difficulty with urination, such as: Inability to control when you urinate (incontinence). Inability to completely empty the bladder (retention). You feel as if your heart is racing (palpitations). You feel dizzy, weak, or faint when you stand up Summary Diabetic neuropathy is nerve damage that is caused by diabetes. It can cause numbness and pain in the arms, legs, digestive tract, heart, and other body systems. This condition is treated by keeping your blood glucose level and your A1C level within your target range. This can help prevent neuropathy from getting worse. Check your skin and feet every day for cuts, bruises, redness, blisters, or sores. Do not use any products that contain nicotine or tobacco, such as  cigarettes, e-cigarettes, and chewing

## 2020-11-06 ENCOUNTER — Ambulatory Visit: Payer: PPO | Admitting: Internal Medicine

## 2020-11-27 ENCOUNTER — Encounter: Payer: Self-pay | Admitting: Hematology

## 2020-12-04 ENCOUNTER — Encounter: Payer: Self-pay | Admitting: Internal Medicine

## 2020-12-04 ENCOUNTER — Ambulatory Visit (INDEPENDENT_AMBULATORY_CARE_PROVIDER_SITE_OTHER): Payer: PPO | Admitting: Internal Medicine

## 2020-12-04 ENCOUNTER — Other Ambulatory Visit: Payer: Self-pay

## 2020-12-04 VITALS — BP 114/68 | HR 82 | Temp 97.7°F | Resp 17 | Ht 61.0 in | Wt 259.0 lb

## 2020-12-04 DIAGNOSIS — E1165 Type 2 diabetes mellitus with hyperglycemia: Secondary | ICD-10-CM | POA: Diagnosis not present

## 2020-12-04 DIAGNOSIS — N183 Chronic kidney disease, stage 3 unspecified: Secondary | ICD-10-CM

## 2020-12-04 DIAGNOSIS — E1169 Type 2 diabetes mellitus with other specified complication: Secondary | ICD-10-CM

## 2020-12-04 DIAGNOSIS — Z794 Long term (current) use of insulin: Secondary | ICD-10-CM | POA: Diagnosis not present

## 2020-12-04 DIAGNOSIS — C9200 Acute myeloblastic leukemia, not having achieved remission: Secondary | ICD-10-CM

## 2020-12-04 DIAGNOSIS — E1122 Type 2 diabetes mellitus with diabetic chronic kidney disease: Secondary | ICD-10-CM

## 2020-12-04 DIAGNOSIS — E785 Hyperlipidemia, unspecified: Secondary | ICD-10-CM

## 2020-12-04 DIAGNOSIS — I10 Essential (primary) hypertension: Secondary | ICD-10-CM | POA: Diagnosis not present

## 2020-12-04 DIAGNOSIS — IMO0002 Reserved for concepts with insufficient information to code with codable children: Secondary | ICD-10-CM

## 2020-12-04 NOTE — Patient Instructions (Signed)

## 2020-12-05 LAB — CBC WITH DIFFERENTIAL/PLATELET
Absolute Monocytes: 72 cells/uL — ABNORMAL LOW (ref 200–950)
Basophils Absolute: 0 cells/uL (ref 0–200)
Basophils Relative: 0 %
Eosinophils Absolute: 81 cells/uL (ref 15–500)
Eosinophils Relative: 4.5 %
HCT: 35.8 % (ref 35.0–45.0)
Hemoglobin: 12.1 g/dL (ref 11.7–15.5)
Lymphs Abs: 844 cells/uL — ABNORMAL LOW (ref 850–3900)
MCH: 34.4 pg — ABNORMAL HIGH (ref 27.0–33.0)
MCHC: 33.8 g/dL (ref 32.0–36.0)
MCV: 101.7 fL — ABNORMAL HIGH (ref 80.0–100.0)
MPV: 10.1 fL (ref 7.5–12.5)
Monocytes Relative: 4 %
Neutro Abs: 803 cells/uL — ABNORMAL LOW (ref 1500–7800)
Neutrophils Relative %: 44.6 %
Platelets: 117 10*3/uL — ABNORMAL LOW (ref 140–400)
RBC: 3.52 10*6/uL — ABNORMAL LOW (ref 3.80–5.10)
RDW: 13.2 % (ref 11.0–15.0)
Total Lymphocyte: 46.9 %
WBC: 1.8 10*3/uL — ABNORMAL LOW (ref 3.8–10.8)

## 2020-12-05 LAB — HEMOGLOBIN A1C
Hgb A1c MFr Bld: 9.3 % of total Hgb — ABNORMAL HIGH (ref <5.7)
Mean Plasma Glucose: 220 mg/dL
eAG (mmol/L): 12.2 mmol/L

## 2020-12-05 LAB — COMPLETE METABOLIC PANEL WITH GFR
AG Ratio: 1.7 (calc) (ref 1.0–2.5)
ALT: 14 U/L (ref 6–29)
AST: 17 U/L (ref 10–35)
Albumin: 4.3 g/dL (ref 3.6–5.1)
Alkaline phosphatase (APISO): 80 U/L (ref 37–153)
BUN/Creatinine Ratio: 26 (calc) — ABNORMAL HIGH (ref 6–22)
BUN: 60 mg/dL — ABNORMAL HIGH (ref 7–25)
CO2: 42 mmol/L — ABNORMAL HIGH (ref 20–32)
Calcium: 9.9 mg/dL (ref 8.6–10.4)
Chloride: 81 mmol/L — ABNORMAL LOW (ref 98–110)
Creat: 2.33 mg/dL — ABNORMAL HIGH (ref 0.60–0.95)
Globulin: 2.6 g/dL (calc) (ref 1.9–3.7)
Glucose, Bld: 318 mg/dL — ABNORMAL HIGH (ref 65–99)
Potassium: 3.3 mmol/L — ABNORMAL LOW (ref 3.5–5.3)
Sodium: 137 mmol/L (ref 135–146)
Total Bilirubin: 0.4 mg/dL (ref 0.2–1.2)
Total Protein: 6.9 g/dL (ref 6.1–8.1)
eGFR: 21 mL/min/{1.73_m2} — ABNORMAL LOW (ref >=60)

## 2020-12-05 LAB — TSH: TSH: 0.86 mIU/L (ref 0.40–4.50)

## 2020-12-06 NOTE — Progress Notes (Addendum)
============================================================ -   Test results slightly outside the reference range are not unusual. If there is anything important, I will review this with you,  otherwise it is considered normal test values.  If you have further questions,  please do not hesitate to contact me at the office or via My Chart.  ============================================================ ============================================================  -  CBC shows  Red Cell Count & Platelets are OK   - WBC count is still very low  ============================================================ ============================================================  - Glucose = 318 mg % - way too high  & A1c = 9.3% - Also way too high  !  - Increase your insulin doses by 5 units /dose                                                      until you see you sugars less than 130 mg % ============================================================ ============================================================

## 2020-12-14 ENCOUNTER — Encounter: Payer: Self-pay | Admitting: Internal Medicine

## 2020-12-14 NOTE — Progress Notes (Signed)
Patient ID: Janet Choi, female   DOB: February 16, 1941, 80 y.o.   MRN: [id number]    Future Appointments  Date Time Provider [place]  07/23/2021  2:00 PM Magda Bernheim, NP [facility] None    History of Present Illness:            This very nice 80 yo DWF with  HTN, IDDM  & Ac Myelogenous Leukemia was seen 1 month ago with with concerns of  worsening or persistent bilat LE edema. She has Metolazone added to her diuretic regimen and has lst  13# over the last month with improvement in her LE edema. She also was rx'd Gabapentin for he painful peripheral sensory neuropathy & reports improvement in her sx's /pain.  Patient is on Hospice for he acute Leukemia.  Medications    insulin aspart (NOVOLOG FLEXPEN) 100 UNIT/ML FlexPen, Inject 20-25 Units into the skin See admin instructions. Inject 20 units subcutaneously three times daily before meals, if CBG >130 increase to 25 units   Insulin Glargine (BASAGLAR KWIKPEN) 100 UNIT/ML, Inject 60 Units into the skin at bedtime.   liraglutide (VICTOZA) 18 MG/3ML SOPN, Inject 1.2 mg into the skin daily at 12 noon.   furosemide (LASIX) 40 MG tablet, Take 40 mg by mouth daily as needed for fluid or edema.   metolazone  10 MG tablet, Take 1/2 to 1 tablet  Daily for Edema / Swelling of Legs   metoprolol succinate (TOPROL-XL) 25 MG 24 hr tablet, Take 1 tablet (25 mg total) by mouth at bedtime.   nitroGLYCERIN (NITROSTAT) 0.4 MG SL tablet, as needed for chest pain.   albuterol (VENTOLIN HFA) 108 (90 Base) MCG/ACT inhaler, Inhale 2 puffs into the lungs every 4 (four) hours as needed for wheezing or shortness of breath.   benzonatate  100 MG capsule, Take 1 capsule 3  times daily as needed for cough.   ipratropium (ATROVENT) 0.06 % nasal spray, Use 1 to 2 sprays each nostril 2 to 3 x /day as needed   ipratropium-albuterol  0.5-2.5 (3) MG/3ML SOLN, Take 3 mLs by neb 3  times daily.   triamcinolone (NASACORT) 55 MCG/ACT AERO nasal inhaler, Place 2 sprays into the  nose 2 (two) times daily   acetaminophen 500 MG tablet, Take 1,000 mg by mouth every 6 (six) hours as needed for headache (pain).   HYDROcodone-APAP 5-325 MG tablet, Take 1 tablet  every 6 (six) hours as needed for moderate pain.   ferrous sulfate 325 (65 FE) MG tablet, Take 325 mg by mouth 2 (two) times daily with a meal.   Ascorbic Acid (VITAMIN C) 1000 MG tablet, Take 1,000 mg by mouth 2 (two) times daily.   BD PEN NEEDLE NANO 2ND GEN 32G X 4 MM MISC, USE AS DIRECTED 4 TIMES A DAY (Patient taking differently: 1 each by Other route in the morning, at noon, in the evening, and at bedtime.)   Cholecalciferol 125 MCG (5000 UT) capsule, Take 5,000 Units by mouth 2 (two) times daily.    Continuous Blood Gluc Receiver (FREESTYLE LIBRE 2 READER) DEVI, Use to test blood sugar multiple times a day.   Continuous Blood Gluc Sensor (FREESTYLE LIBRE 2 SENSOR) MISC, Use to check blood sugar multiple times a day   diclofenac sodium (VOLTAREN) 1 % GEL, Apply 4 g topically 4 (four) times daily. (Patient taking differently: Apply 4 g topically 2 (two) times daily as needed (pain).)   escitalopram (LEXAPRO) 20 MG tablet, Take 1 tablet (20  mg total) by mouth daily.   gabapentin (NEURONTIN) 300 MG capsule, Take  1 capsule   3  x /day  for Painful Diabetic Neuropathy   ketoconazole (NIZORAL) 2 % cream, APPLY TO AFFECTED AREA TWICE A DAY (Patient taking differently: Apply 1 application topically 2 (two) times daily. For facial itching)   nystatin (MYCOSTATIN) 100000 UNIT/ML suspension, 5 ml four times a day, retain in mouth as long as possible (Swish and Swallow).  Use for 48 hours after symptoms resolve. (Patient taking differently: Use as directed 5 mLs in the mouth or throat See admin instructions. Take 5 ml by mouth four times a day, retain in mouth as long as possible (Swish and Swallow).  Use for 48 hours after symptoms resolve.)   ondansetron (ZOFRAN) 4 MG tablet, Take 1 tablet (4 mg total) by mouth every 6 (six)  hours as needed for nausea.   OXYGEN, Inhale 2 L into the lungs continuous.   pantoprazole (PROTONIX) 40 MG tablet, Take 1 tablet (40 mg total) by mouth 2 (two) times daily as needed. For acid reflux.   Polyethylene Glycol 400 (BLINK TEARS OP), Place 1 drop into both eyes daily as needed (dry eyes).    rOPINIRole (REQUIP) 3 MG tablet, TAKE 1 TABLET 4 TIMES A DAY FOR RESTLESS LEGS (Patient taking differently: Take 6 mg by mouth 2 (two) times daily.)   tiZANidine (ZANAFLEX) 4 MG tablet, Take 2 mg by mouth at bedtime as needed for muscle spasms.   triamcinolone ointment (KENALOG) 0.1 %, Apply 1 application topically 2 (two) times daily.  Problem list She has Hyperlipidemia associated with type 2 diabetes mellitus ([place]); Essential hypertension; Diverticulosis of large intestine; History of colonic polyps; Morbid obesity due to excess calories ([place]); Vitamin D deficiency; Medication management; RLS (restless legs syndrome); Type 2 diabetes mellitus with hyperlipidemia (HCC); OSA and COPD overlap syndrome ([place]); Iron deficiency anemia; History of non-ST elevation myocardial infarction (NSTEMI); CKD stage 3 due to type 2 diabetes mellitus ([place]); CAD S/P percutaneous coronary angioplasty; DJD (degenerative joint disease), lumbosacral; Upper airway cough syndrome; Diabetic polyneuropathy associated with type 2 diabetes mellitus (HCC); DJD (degenerative joint disease) of knee; Acute on chronic diastolic CHF (congestive heart failure) ([place]); Superior mesenteric artery stenosis ([place]); 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 ([place]); Insulin dependent type 2 diabetes mellitus ([place]); Solitary pulmonary nodule; Chronic bronchitis ([place]); Long term (current) use of insulin ([place]); Pancreatic insufficiency; Pancytopenia ([place]); Chronic diarrhea; Anemia; Thrombocytopenia ([place]); Neutropenia ([place]); Diverticulosis of colon without hemorrhage; Polyp of ascending colon; Polyp of sigmoid colon; Tortuous colon; Gastritis  and gastroduodenitis; Hiatal hernia; Acute pulmonary edema ([place]); Acute on chronic respiratory failure with hypoxia ([place]); Acute diastolic heart failure ([place]); AKI (acute kidney injury) ([place]); Obesity, Class III, BMI 40-49.9 (morbid obesity) ([place]); Hypotension due to hypovolemia; Acute lower UTI; E. coli UTI; Acute renal failure ([place]); History of renal calculi; Acute myeloid leukemia ([place]); COVID-19 06/14/2020); Acute on chronic respiratory failure ([place]); Symptomatic anemia; Counseling regarding advance care planning and goals of care; and Skin lesion of right leg on their problem list.   Observations/Objective:  BP 114/68   P 82   T 97.7 F    R 17   Ht 5' 1"   Wt 259 lb    SpO2 94% On 3 liters of O2  BMI 48.94 kg/m   HEENT - WNL. Neck - supple.  Chest - Clear equal BS. Cor - Nl HS. RRR w/o sig MGR. PP 1(+). No edema. Abd -  Rotund. Soft. Benign. MS- FROM w/o deformities.  Gait Nl. Neuro -  Nl w/o focal abnormalities.  Assessment and Plan:  1. Essential hypertension  - CBC with Differential/Platelet - COMPLETE METABOLIC PANEL WITH GFR - TSH  2. Hyperlipidemia associated with type 2 diabetes mellitus ([place])   3. 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 Uncontrolled type 2 diabetes mellitus with stage 3 chronic kidney  disease, with long-term current use of insulin (HCC)  - Hemoglobin A1c   Follow Up Instructions:      I discussed the assessment and treatment plan with the patient. The patient was provided an opportunity to ask questions and all were answered. The patient agreed with the plan and demonstrated an understanding of the instructions.       The patient was advised to call back or seek an in-person evaluation if the symptoms worsen or if the condition fails to improve as anticipated.   Kirtland Bouchard, MD

## 2020-12-20 ENCOUNTER — Other Ambulatory Visit: Payer: Self-pay | Admitting: Adult Health

## 2020-12-20 ENCOUNTER — Other Ambulatory Visit: Payer: Self-pay | Admitting: Cardiovascular Disease

## 2020-12-25 ENCOUNTER — Other Ambulatory Visit: Payer: Self-pay | Admitting: Internal Medicine

## 2020-12-25 DIAGNOSIS — R609 Edema, unspecified: Secondary | ICD-10-CM

## 2020-12-26 ENCOUNTER — Other Ambulatory Visit: Payer: Self-pay | Admitting: Internal Medicine

## 2020-12-26 MED ORDER — TRAMADOL HCL 50 MG PO TABS: ORAL_TABLET | ORAL | 0 refills | Status: AC

## 2021-01-07 ENCOUNTER — Other Ambulatory Visit: Payer: Self-pay | Admitting: Adult Health

## 2021-01-07 DIAGNOSIS — IMO0002 Reserved for concepts with insufficient information to code with codable children: Secondary | ICD-10-CM

## 2021-01-07 DIAGNOSIS — Z794 Long term (current) use of insulin: Secondary | ICD-10-CM

## 2021-01-07 DIAGNOSIS — E1122 Type 2 diabetes mellitus with diabetic chronic kidney disease: Secondary | ICD-10-CM

## 2021-01-22 ENCOUNTER — Other Ambulatory Visit: Payer: Self-pay | Admitting: Internal Medicine

## 2021-01-25 ENCOUNTER — Other Ambulatory Visit: Payer: Self-pay | Admitting: Internal Medicine

## 2021-03-15 IMAGING — DX DG CHEST 1V PORT
1 series · 1 of 1 positions shown · non-contrast
Comparison: CT 08/28/2019.  Chest x-ray 07/24/2019.

CLINICAL DATA: Shortness of breath.

EXAM:
PORTABLE CHEST 1 VIEW

[chest ap]
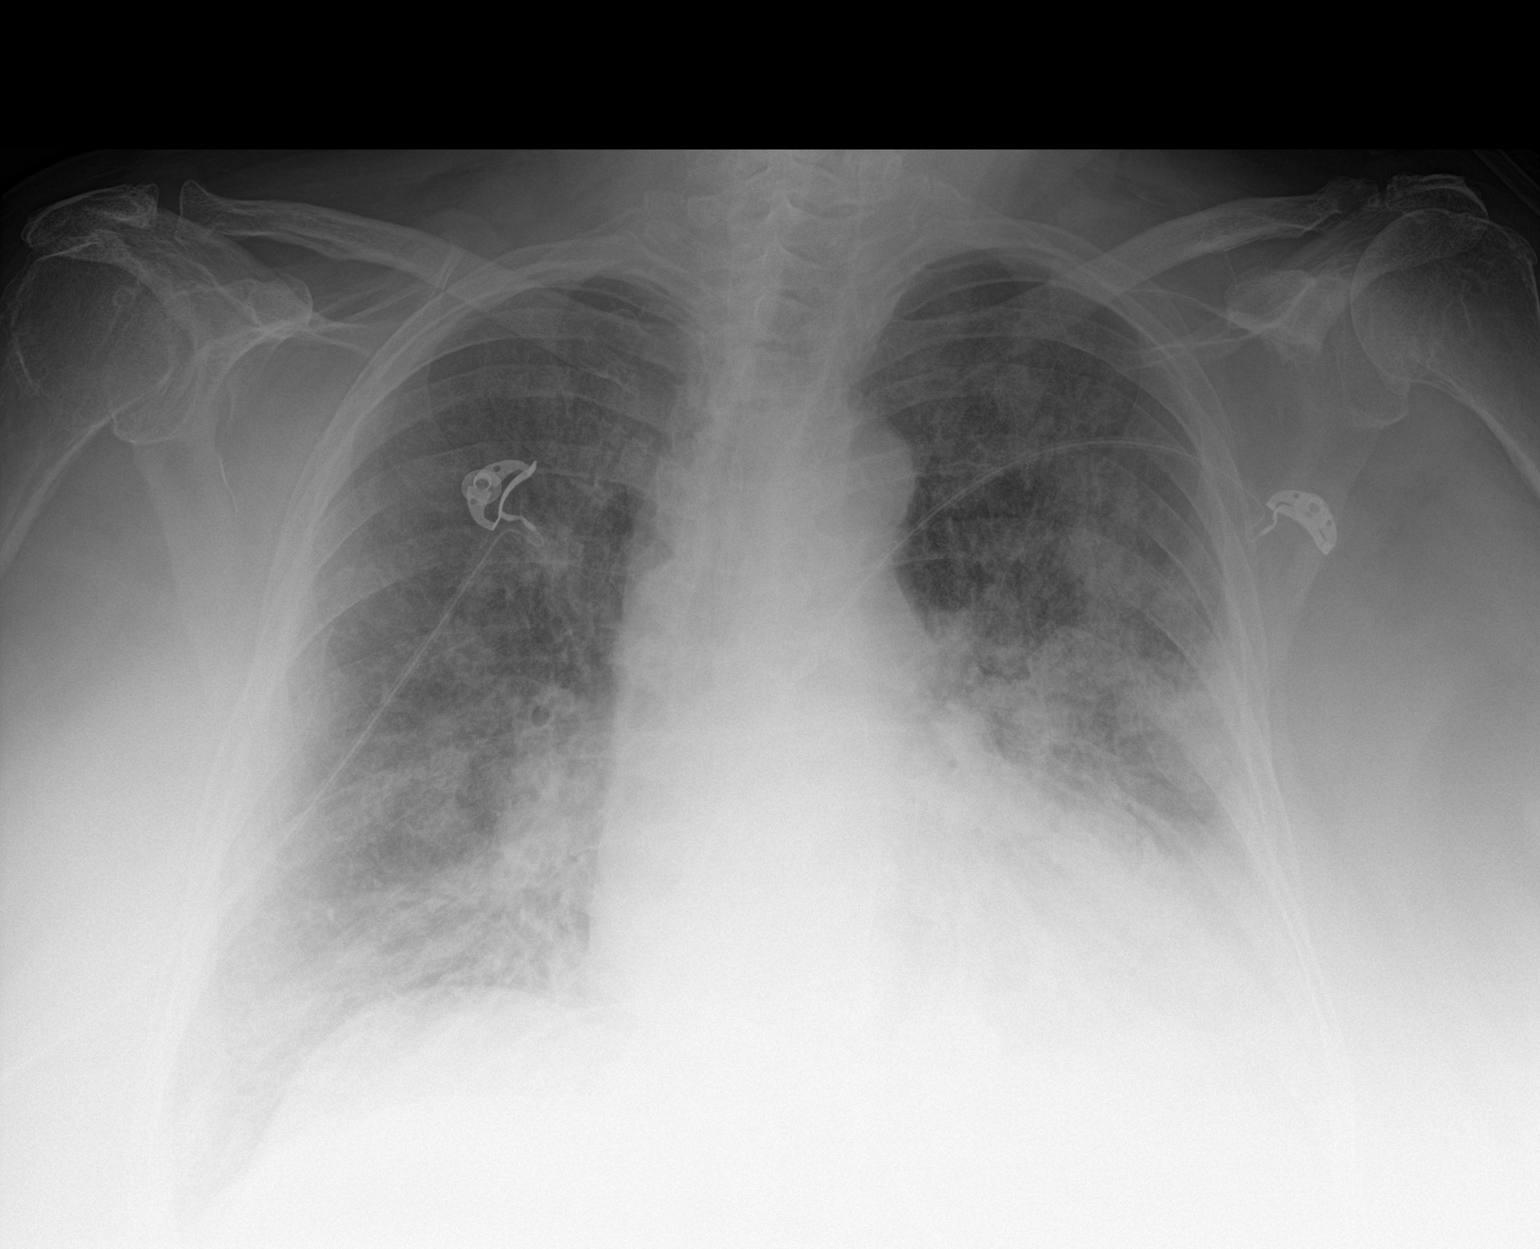

[1 of 1 positions shown; findings below may reference images not displayed]

FINDINGS: Cardiomegaly. Diffuse bilateral pulmonary infiltrates/edema. Small
bilateral pleural effusions. No pneumothorax. Thoracic spine
scoliosis and degenerative change.
IMPRESSION: 1.  Cardiomegaly.

2. Diffuse bilateral pulmonary infiltrates/edema. Small bilateral
pleural effusions.

## 2021-03-16 IMAGING — DX DG CHEST 1V PORT
1 series · 1 of 1 positions shown · non-contrast
Comparison: Radiograph yesterday.  CT 06/30/2019

CLINICAL DATA: Hypoxia.

EXAM:
PORTABLE CHEST 1 VIEW

[chest ap]
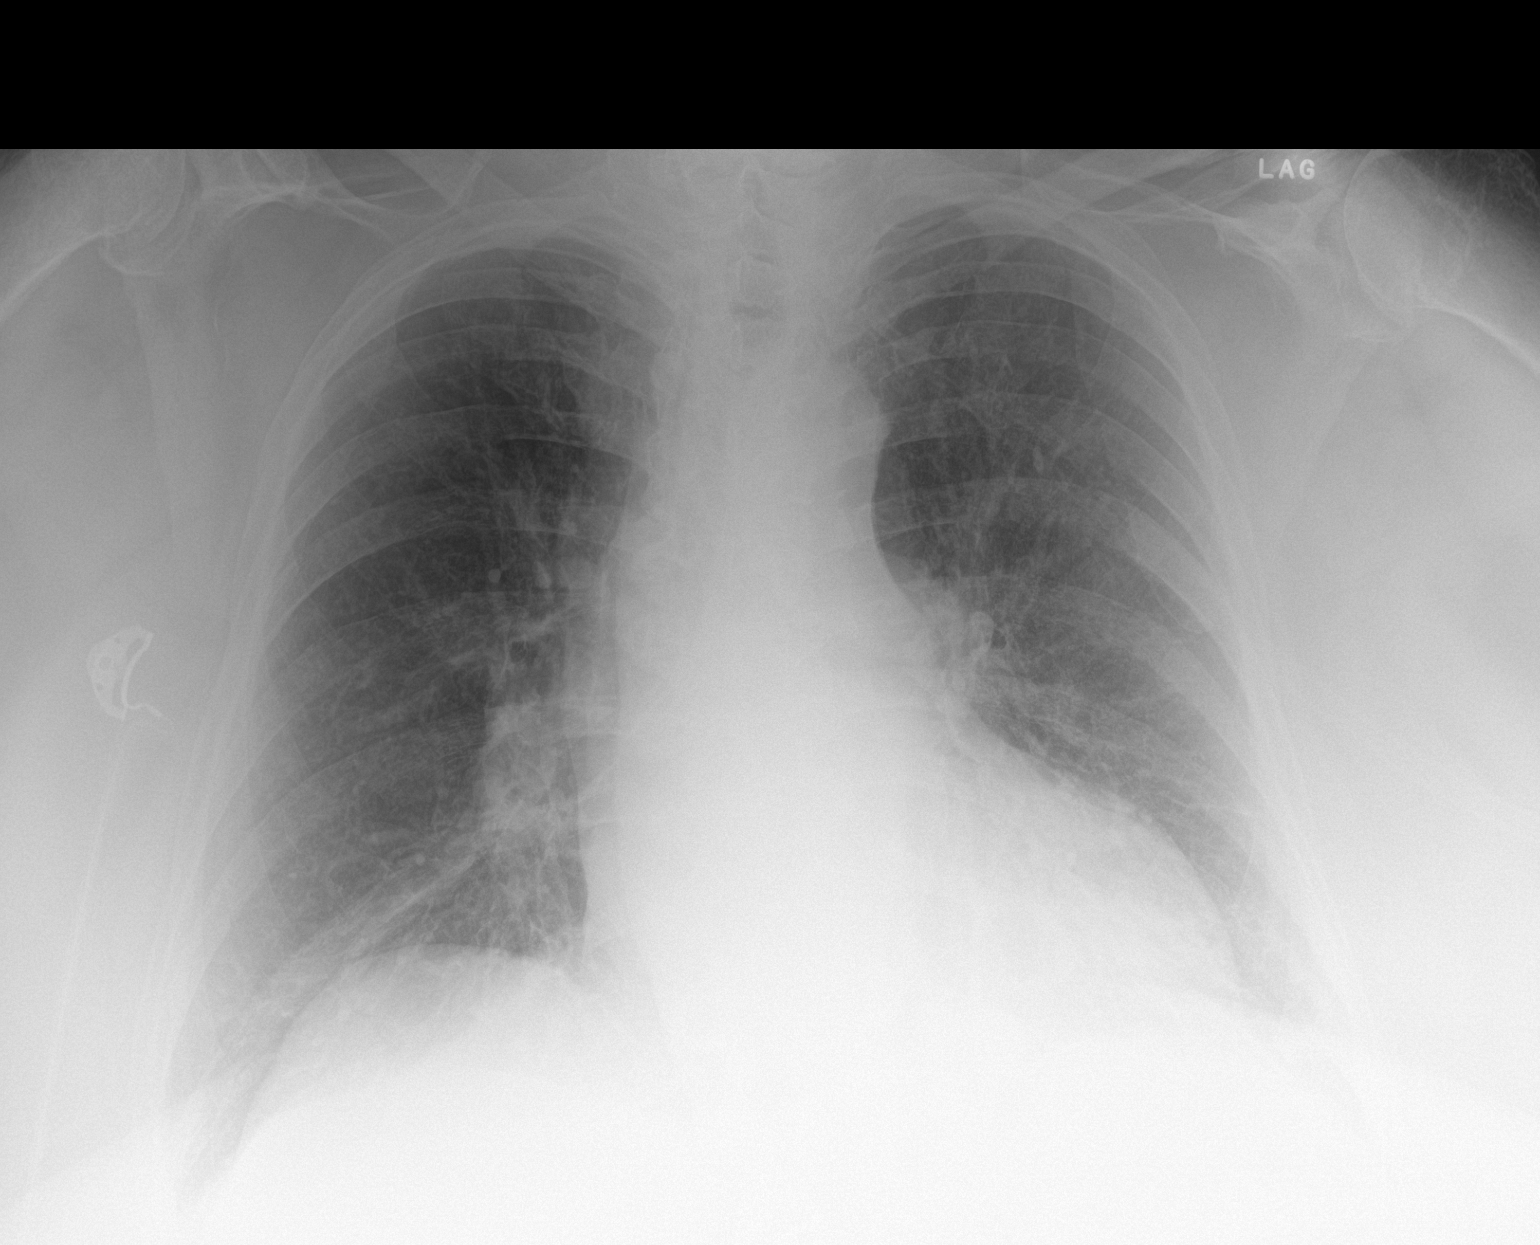

[1 of 1 positions shown; findings below may reference images not displayed]

FINDINGS: Cardiomegaly is stable. Unchanged mediastinal contours. Improving
interstitial opacities from prior exam. No confluent airspace
disease. No pneumothorax. No large pleural effusion. Degenerative
change of both shoulders.
IMPRESSION: Stable cardiomegaly. Improving interstitial opacities from prior
exam may represent pulmonary edema or infection. No new abnormality.

## 2021-03-17 IMAGING — US US RENAL
1 series · 14 of 25 positions shown · non-contrast
Comparison: CT 12/05/2019, 10/17/2019

CLINICAL DATA: Acute renal failure

EXAM:
RENAL / URINARY TRACT ULTRASOUND COMPLETE

[Series 1: us renal · 14 of 42 slices shown]
[im 1/42]
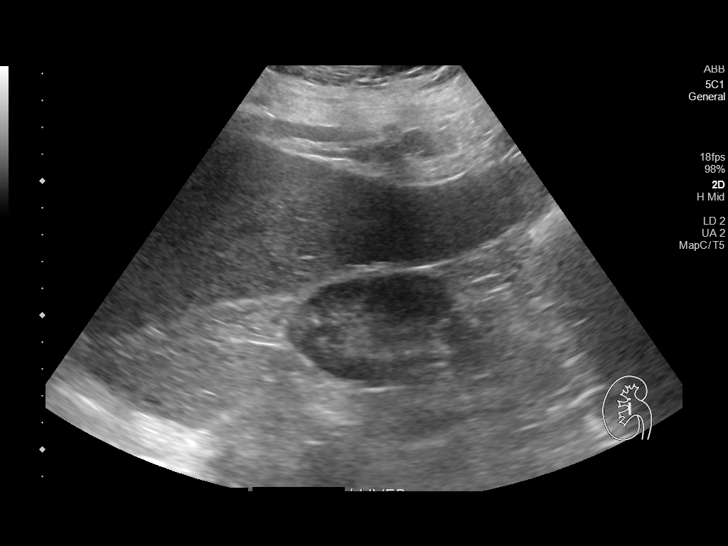
[im 4/42]
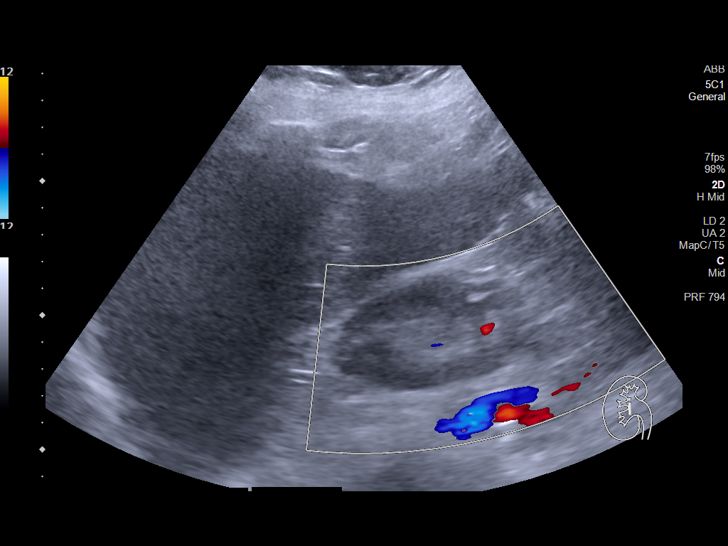
[im 7/42]
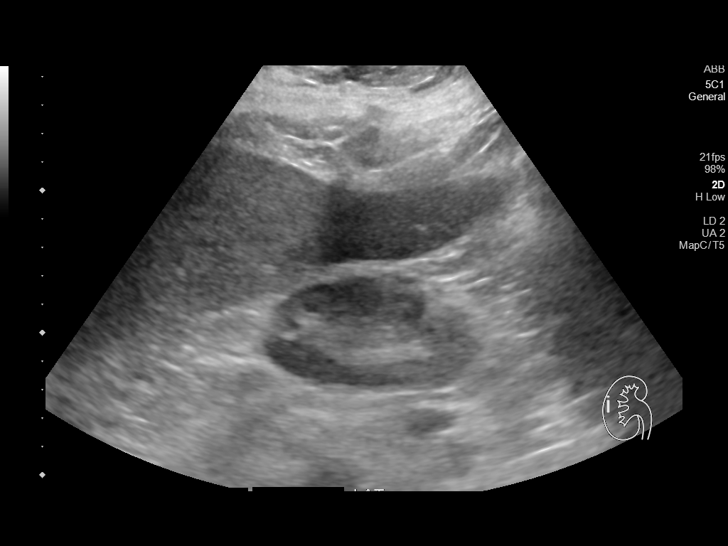
[im 11/42]
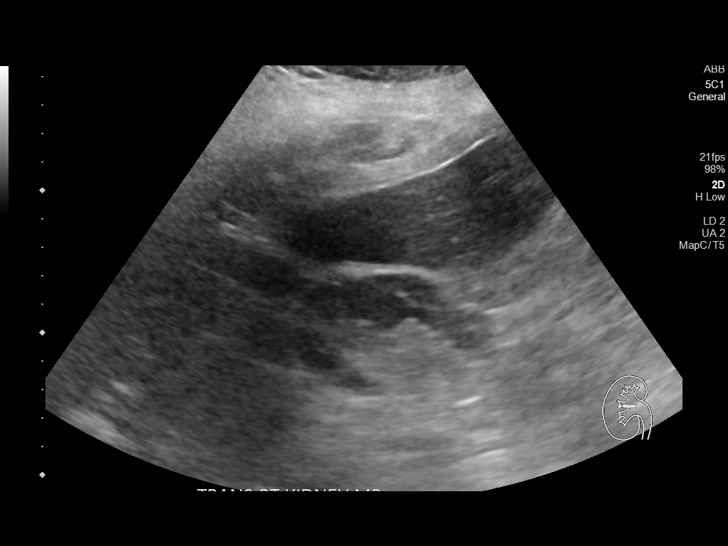
[im 14/42]
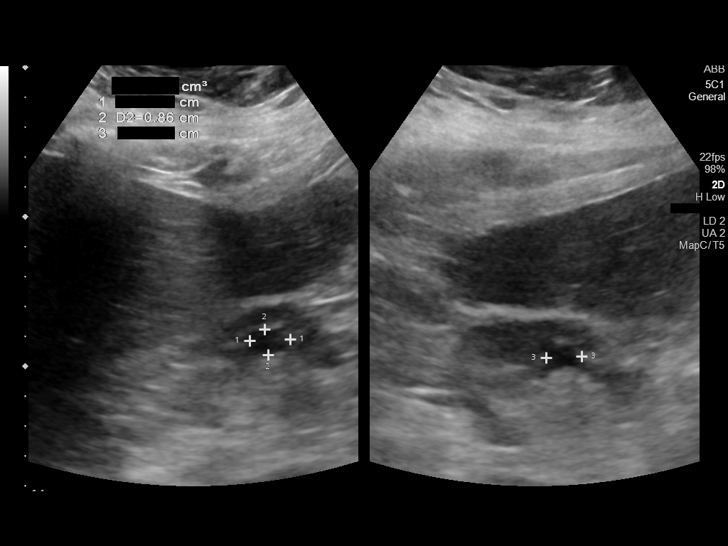
[im 16/42]
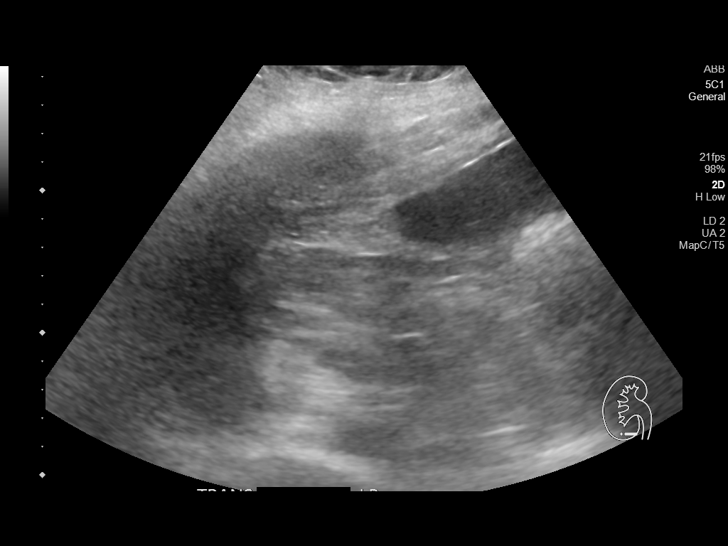
[im 19/42]
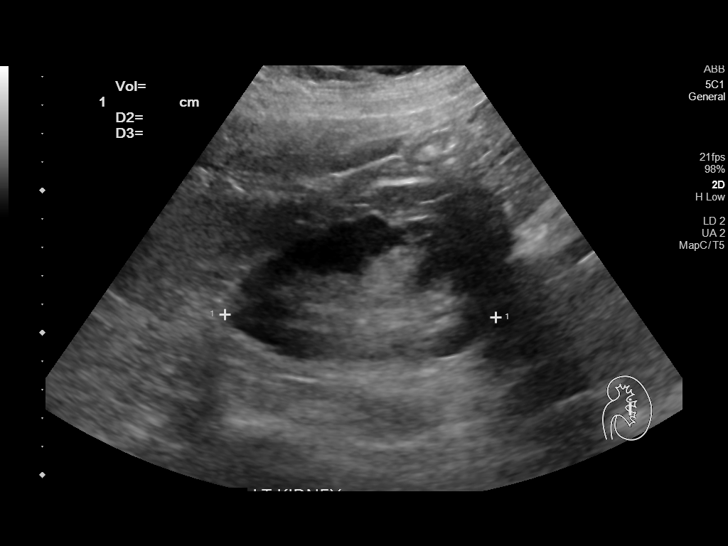
[im 23/42]
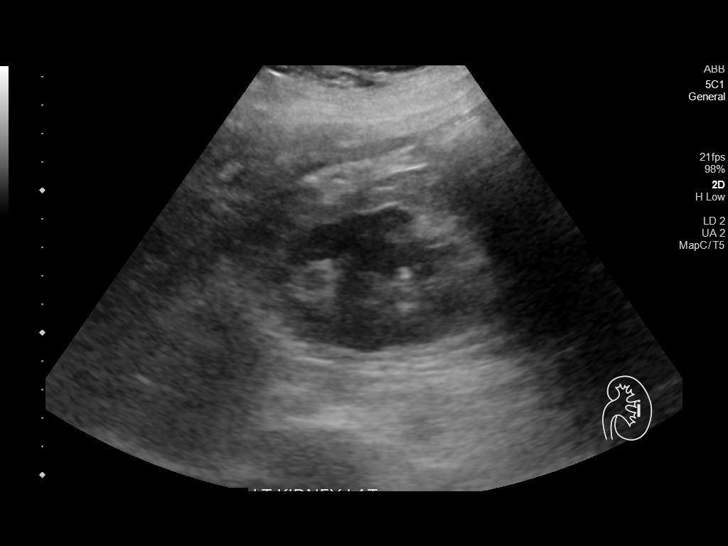
[im 26/42]
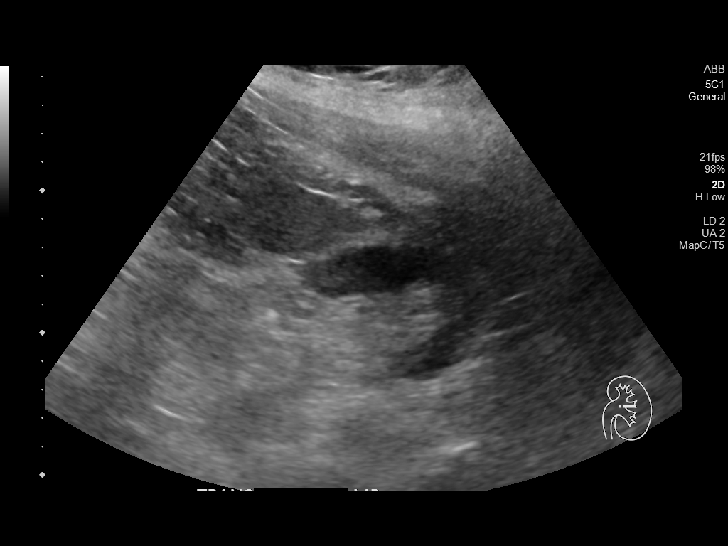
[im 28/42]
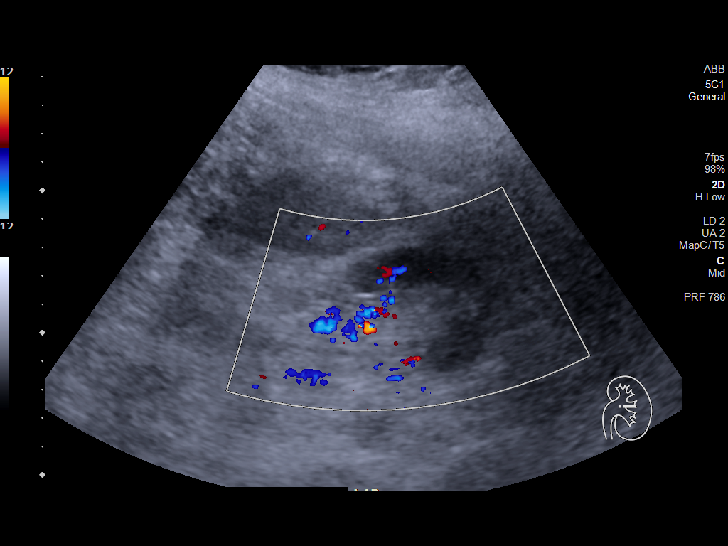
[im 31/42]
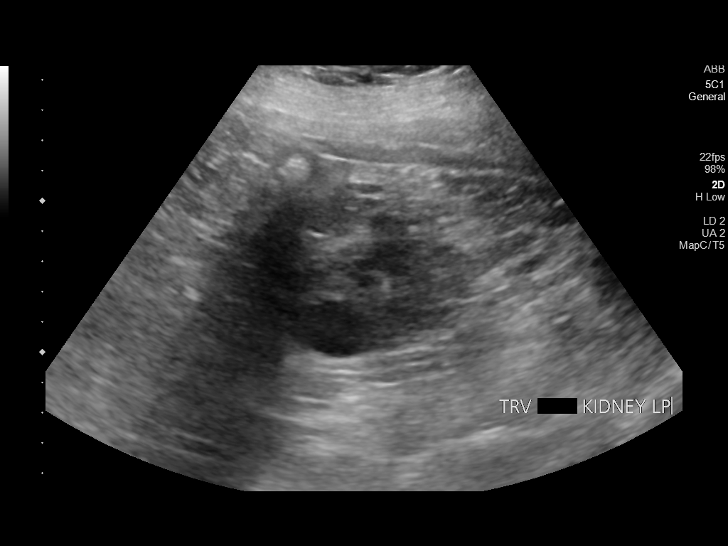
[im 35/42]
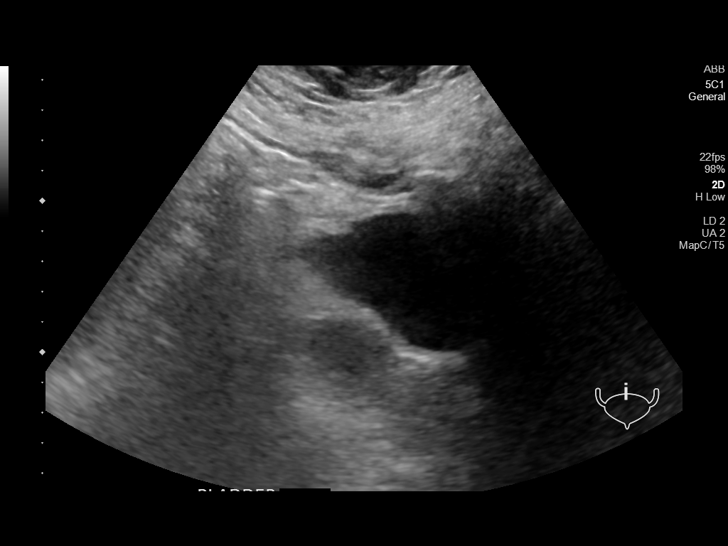
[im 38/42]
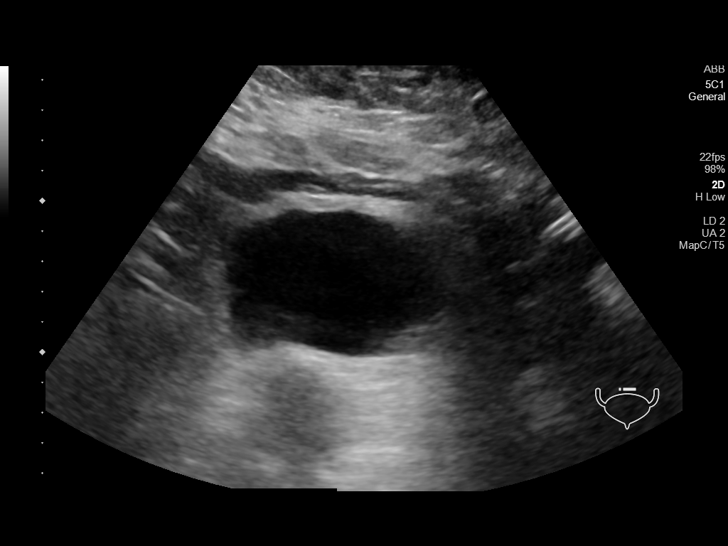
[im 42/42]
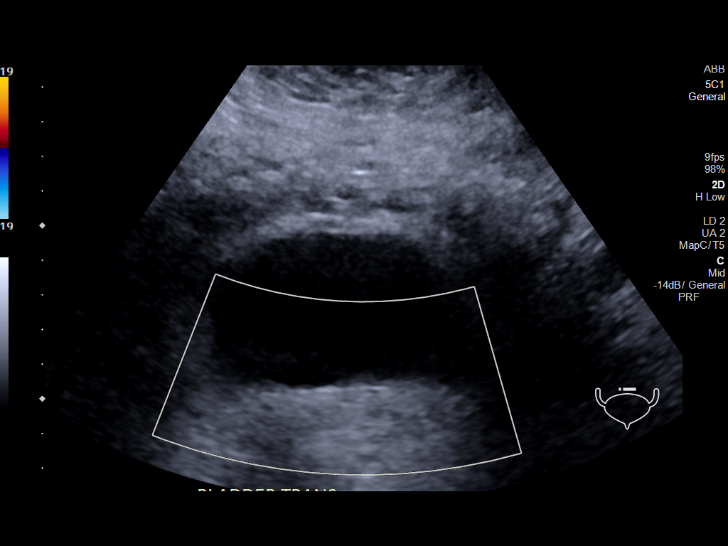

[14 of 25 positions shown; findings below may reference images not displayed]

FINDINGS: Right Kidney:

Renal measurements: 7.9 x 5.5 x 5.3 cm = volume: 121.1 mL. Cortical
echogenicity is normal. No hydronephrosis. Cyst in the lower pole
measuring 1.4 cm.

Left Kidney:

Renal measurements: 9.5 x 5.5 x 5.9 cm = volume: 160.9 mL. Cortical
echogenicity is normal. No hydronephrosis. Cyst off the lower pole
measuring 2.3 cm. Small echogenic focus at the midpole measuring 5
mm.

Bladder:

Appears normal for degree of bladder distention.

Other:

None.
IMPRESSION: 1. Negative for hydronephrosis
2. Bilateral renal cysts
3. Possible 5 mm stone left kidney

## 2021-04-13 ENCOUNTER — Other Ambulatory Visit: Payer: Self-pay | Admitting: Adult Health

## 2021-04-14 ENCOUNTER — Other Ambulatory Visit: Payer: Self-pay

## 2021-04-14 ENCOUNTER — Encounter: Payer: Self-pay | Admitting: Internal Medicine

## 2021-04-14 ENCOUNTER — Ambulatory Visit (INDEPENDENT_AMBULATORY_CARE_PROVIDER_SITE_OTHER): Payer: PPO | Admitting: Internal Medicine

## 2021-04-14 VITALS — BP 164/68 | HR 86 | Temp 97.9°F | Resp 18 | Ht 61.0 in | Wt 251.0 lb

## 2021-04-14 DIAGNOSIS — Z79899 Other long term (current) drug therapy: Secondary | ICD-10-CM

## 2021-04-14 DIAGNOSIS — C9200 Acute myeloblastic leukemia, not having achieved remission: Secondary | ICD-10-CM

## 2021-04-14 DIAGNOSIS — Z794 Long term (current) use of insulin: Secondary | ICD-10-CM | POA: Diagnosis not present

## 2021-04-14 DIAGNOSIS — N1831 Chronic kidney disease, stage 3a: Secondary | ICD-10-CM

## 2021-04-14 DIAGNOSIS — E785 Hyperlipidemia, unspecified: Secondary | ICD-10-CM

## 2021-04-14 DIAGNOSIS — E1122 Type 2 diabetes mellitus with diabetic chronic kidney disease: Secondary | ICD-10-CM

## 2021-04-14 DIAGNOSIS — E1140 Type 2 diabetes mellitus with diabetic neuropathy, unspecified: Secondary | ICD-10-CM

## 2021-04-14 DIAGNOSIS — I10 Essential (primary) hypertension: Secondary | ICD-10-CM | POA: Diagnosis not present

## 2021-04-14 DIAGNOSIS — E1169 Type 2 diabetes mellitus with other specified complication: Secondary | ICD-10-CM

## 2021-04-14 NOTE — Progress Notes (Signed)
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 Occasionally monitors CBGs. No hypoglycemia or diabetic polys.   Her sister - Ines Bloomer - accompanies her today. Relates that her son is trying to supervise & regulate her meds   and restrict many of her meds. She does report painful Diabetic Neuropathy.    Medications    Insulin Glargine (BASAGLAR KWIKPEN) 100 UNIT/ML, Inject 60 Units into the skin at bedtime.   liraglutide (VICTOZA) 18 MG/3ML SOPN, Inject 1.2 mg into the skin daily at 12 noon.   NOVOLOG FLEXPEN 100 UNIT/ML FlexPen, INJECT 20 UNITS UNDER SKIN THREE TIMES DAILY BEFORE MEALS, IF CBG >130 INCREASE TO 25 UNITS     furosemide (LASIX) 40 MG tablet, Take 40 mg by mouth daily as needed for fluid or edema.   metolazone (ZAROXOLYN) 10 MG tablet, Take 1/2 to 1 tablet  Daily for Edema / Swelling of Legs   metoprolol succinate (TOPROL-XL) 25 MG 24 hr tablet, Take 1 tablet (25 mg total) by mouth at bedtime.   nitroGLYCERIN (NITROSTAT) 0.4 MG SL tablet, Place 1 tablet (0.4 mg total) under the tongue every 5 (five) minutes x 3 doses as needed for chest pain.    albuterol HFA   inhaler, Inhale 2 puffs i every 4 (four) hours as needed for wheezing or shortness of breath.   benzonatate (TESSALON) 100 MG capsule, Take 1 perle  3 x /day  as needed to Prevent Cough  /  Patient knows to take by mouth   ipratropium (ATROVENT) 0.06 % nasal spray, Use 1 to 2 sprays each nostril 2 to 3 x /day as needed   ipratropium-albuterol (DUONEB) 0.5-2.5 (3) MG/3ML SOLN, Take 3 mLs by neb 3 times daily.   NASACORT nasal inhaler, Place 2 sprays into the nose (two) times  daily     acetaminophen 500 MG tablet, Take 1,000 mg by mouth every 6 (six) hours as needed for headache (pain).   traMADol (ULTRAM) 50 MG tablet, Take 1 tablet every 4 hours to prevent severe cough   ferrous sulfate 325 (65 FE) MG tablet, Take 325 mg by mouth 2 (two) times daily with a meal.     Ascorbic Acid (VITAMIN C) 1000 MG tablet, Take 1,000 mg by mouth 2 (two) times daily.   BD PEN NEEDLE NANO 2ND GEN 32G X 4 MM MISC, USE AS DIRECTED 4 TIMES A DAY (Patient taking differently: 1 each by Other route in the morning, at noon, in the evening, and at bedtime.)   Cholecalciferol 125 MCG (5000 UT) capsule, Take 5,000 Units by mouth 2 (two) times daily.     diclofenac sodium (VOLTAREN) 1 % GEL, Apply 4 g topically 4 (four) times daily. (Patient taking differently: Apply 4 g topically 2 (two) times daily as needed (pain).)   escitalopram (LEXAPRO) 20 MG tablet, Take 1 tablet (20 mg total) by mouth daily.   gabapentin (NEURONTIN) 300 MG capsule, Take  1 capsule   3  x /day  for Painful Diabetic Neuropathy   hydrOXYzine Pamoate (VISTARIL PO), Take 1 tablet by mouth 4 (four)  times daily as needed. ? 25 mg, unsure   ketoconazole (NIZORAL) 2 % cream, APPLY TO AFFECTED AREA TWICE A DAY (Patient taking differently: Apply 1 application topically 2 (two) times daily. For facial itching)    nystatin (MYCOSTATIN) 100000 UNIT/ML suspension, 5 ml four times a day, retain in mouth as long as possible (Swish and Swallow).  Use for 48 hours after symptoms resolve. (Patient taking differently: Use as directed 5 mLs in the mouth or throat See admin instructions. Take 5 ml by mouth four times a day, retain in mouth as long as possible (Swish and Swallow).  Use for 48 hours after symptoms resolve.)   ondansetron (ZOFRAN) 4 MG tablet, Take 1 tab   OXYGEN, Inhale 2 L into the lungs continuous.   pantoprazole (PROTONIX) 40 MG tablet, Take 1 tablet (40 mg total) by mouth 2 (two) times daily as needed. For acid reflux.    Polyethylene Glycol 400 (BLINK TEARS OP), Place 1 drop into both eyes daily as needed (dry eyes).    rOPINIRole (REQUIP) 3 MG tablet, TAKE 1 TABLET 4 TIMES A DAY FOR RESTLESS LEGS   tiZANidine (ZANAFLEX) 4 MG tablet, Take 2 mg by mouth at bedtime as needed for muscle spasms.   triamcinolone ointment (KENALOG) 0.1 %, Apply 1 application topically 2 (two) times daily.  Problem list She has Hyperlipidemia associated with type 2 diabetes mellitus ([place]); Essential hypertension; Diverticulosis of large intestine; History of colonic polyps; Morbid obesity due to excess calories ([place]); Vitamin D deficiency; Medication management; RLS (restless legs syndrome); Type 2 diabetes mellitus with hyperlipidemia (HCC); OSA and COPD overlap syndrome ([place]); Iron deficiency anemia; History of non-ST elevation myocardial infarction (NSTEMI); CKD stage 3 due to type 2 diabetes mellitus ([place]); CAD S/P percutaneous coronary angioplasty; DJD (degenerative joint disease), lumbosacral; Upper airway cough syndrome; Diabetic polyneuropathy associated with type 2 diabetes mellitus (HCC); DJD (degenerative joint disease) of knee; Acute on chronic diastolic CHF (congestive heart failure) ([place]); Superior mesenteric artery stenosis ([place]); 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 ([place]); Insulin dependent type 2 diabetes mellitus ([place]); Solitary pulmonary nodule; Chronic bronchitis ([place]); Long term (current) use of insulin ([place]); Pancreatic insufficiency; Pancytopenia ([place]); Chronic diarrhea; Anemia; Thrombocytopenia ([place]); Neutropenia ([place]); Diverticulosis of colon without hemorrhage; Polyp of ascending colon; Polyp of sigmoid colon; Tortuous colon; Gastritis and gastroduodenitis; Hiatal hernia; Acute pulmonary edema ([place]); Acute on chronic respiratory failure with hypoxia ([place]); Acute diastolic heart failure ([place]); AKI (acute kidney injury) ([place]); Obesity, Class III, BMI 40-49.9 (morbid obesity) ([place]); Hypotension due to  hypovolemia; Acute lower UTI; E. coli UTI; Acute renal failure ([place]); History of renal calculi; Acute myeloid leukemia ([place]); COVID-19 06/14/2020); Acute on chronic respiratory failure ([place]); Symptomatic anemia; Counseling regarding advance care planning and goals of care; and Skin lesion of right leg on their problem list.   Observations/Objective:  BP (!) 164/68   Pulse 86   Temp 97.9 F (36.6 C)   Resp 18   Ht 5\' 1"  (1.549 m)   Wt 251 lb (113.9 kg)   SpO2 93%   BMI 47.43 kg/m   HEENT - WNL. Neck - supple.  Chest - Clear equal BS. Cor - Nl HS. RRR w/o sig MGR. PP 1(+). No edema. Abd - Rotund , Soft.  MS- FROM w/o deformities.  Gait Nl. Neuro -  Nl w/o focal abnormalities.   Assessment and Plan:  1. Essential hypertension  - CBC with Differential/Platelet - COMPLETE METABOLIC PANEL WITH GFR  2. Acute myeloid  leukemia in adult [facility])  - CBC with Differential/Platelet  3. Hyperlipidemia associated with type 2 diabetes mellitus ([place])   4. Type 2 diabetes mellitus with stage 3a chronic kidney  disease, with long-term current use of insulin (HCC)  - COMPLETE METABOLIC PANEL WITH GFR - Hemoglobin A1c  5. Painful diabetic neuropathy (HCC)  - COMPLETE METABOLIC PANEL WITH GFR  6. Medication management  - CBC with Differential/Platelet - COMPLETE METABOLIC PANEL WITH GFR - Hemoglobin A1c  Follow Up Instructions:        I discussed the assessment and treatment plan with the patient. The patient was provided an opportunity to ask questions and all were answered. The patient agreed with the plan and demonstrated an understanding of the instructions.       The patient was advised to call back or seek an in-person evaluation if the symptoms worsen or if the condition fails to improve as anticipated.   Kirtland Bouchard, MD

## 2021-04-15 LAB — CBC WITH DIFFERENTIAL/PLATELET
Absolute Monocytes: 38 cells/uL — ABNORMAL LOW (ref 200–950)
Basophils Absolute: 0 cells/uL (ref 0–200)
Basophils Relative: 0 %
Eosinophils Absolute: 29 cells/uL (ref 15–500)
Eosinophils Relative: 2.4 %
HCT: 28.8 % — ABNORMAL LOW (ref 35.0–45.0)
Hemoglobin: 9.8 g/dL — ABNORMAL LOW (ref 11.7–15.5)
Lymphs Abs: 726 cells/uL — ABNORMAL LOW (ref 850–3900)
MCH: 33.8 pg — ABNORMAL HIGH (ref 27.0–33.0)
MCHC: 34 g/dL (ref 32.0–36.0)
MCV: 99.3 fL (ref 80.0–100.0)
MPV: 10.8 fL (ref 7.5–12.5)
Monocytes Relative: 3.2 %
Neutro Abs: 407 cells/uL — CL (ref 1500–7800)
Neutrophils Relative %: 33.9 %
Platelets: 72 10*3/uL — ABNORMAL LOW (ref 140–400)
RBC: 2.9 10*6/uL — ABNORMAL LOW (ref 3.80–5.10)
RDW: 15.8 % — ABNORMAL HIGH (ref 11.0–15.0)
Total Lymphocyte: 60.5 %
WBC: 1.2 10*3/uL — ABNORMAL LOW (ref 3.8–10.8)

## 2021-04-15 LAB — COMPLETE METABOLIC PANEL WITH GFR
AG Ratio: 1.7 (calc) (ref 1.0–2.5)
ALT: 16 U/L (ref 6–29)
AST: 19 U/L (ref 10–35)
Albumin: 4.2 g/dL (ref 3.6–5.1)
Alkaline phosphatase (APISO): 52 U/L (ref 37–153)
BUN/Creatinine Ratio: 24 (calc) — ABNORMAL HIGH (ref 6–22)
BUN: 37 mg/dL — ABNORMAL HIGH (ref 7–25)
CO2: 37 mmol/L — ABNORMAL HIGH (ref 20–32)
Calcium: 9.5 mg/dL (ref 8.6–10.4)
Chloride: 95 mmol/L — ABNORMAL LOW (ref 98–110)
Creat: 1.54 mg/dL — ABNORMAL HIGH (ref 0.60–0.95)
Globulin: 2.5 g/dL (calc) (ref 1.9–3.7)
Glucose, Bld: 191 mg/dL — ABNORMAL HIGH (ref 65–99)
Potassium: 4.3 mmol/L (ref 3.5–5.3)
Sodium: 141 mmol/L (ref 135–146)
Total Bilirubin: 0.6 mg/dL (ref 0.2–1.2)
Total Protein: 6.7 g/dL (ref 6.1–8.1)
eGFR: 34 mL/min/{1.73_m2} — ABNORMAL LOW (ref >=60)

## 2021-04-15 LAB — HEMOGLOBIN A1C
Hgb A1c MFr Bld: 9.6 % of total Hgb — ABNORMAL HIGH (ref <5.7)
Mean Plasma Glucose: 229 mg/dL
eAG (mmol/L): 12.7 mmol/L

## 2021-04-15 NOTE — Patient Instructions (Signed)
  Meds reviewed with patient & her sister

## 2021-04-29 ENCOUNTER — Other Ambulatory Visit: Payer: Self-pay | Admitting: Adult Health

## 2021-04-29 ENCOUNTER — Other Ambulatory Visit: Payer: Self-pay | Admitting: Nurse Practitioner

## 2021-04-29 DIAGNOSIS — Z794 Long term (current) use of insulin: Secondary | ICD-10-CM

## 2021-05-10 ENCOUNTER — Inpatient Hospital Stay (HOSPITAL_COMMUNITY)
Admission: EM | Admit: 2021-05-10 | Discharge: 2021-05-13 | DRG: 871 | Disposition: A | Discharge status: Hospice | Attending: Internal Medicine | Admitting: Internal Medicine

## 2021-05-10 ENCOUNTER — Encounter: Payer: Self-pay | Admitting: Hematology

## 2021-05-10 ENCOUNTER — Emergency Department (HOSPITAL_COMMUNITY)

## 2021-05-10 ENCOUNTER — Encounter (HOSPITAL_COMMUNITY): Payer: Self-pay | Admitting: Emergency Medicine

## 2021-05-10 ENCOUNTER — Other Ambulatory Visit: Payer: Self-pay

## 2021-05-10 DIAGNOSIS — C9200 Acute myeloblastic leukemia, not having achieved remission: Secondary | ICD-10-CM | POA: Diagnosis present

## 2021-05-10 DIAGNOSIS — R0602 Shortness of breath: Secondary | ICD-10-CM | POA: Diagnosis not present

## 2021-05-10 DIAGNOSIS — E878 Other disorders of electrolyte and fluid balance, not elsewhere classified: Secondary | ICD-10-CM | POA: Diagnosis present

## 2021-05-10 DIAGNOSIS — J440 Chronic obstructive pulmonary disease with acute lower respiratory infection: Secondary | ICD-10-CM | POA: Diagnosis present

## 2021-05-10 DIAGNOSIS — Z886 Allergy status to analgesic agent status: Secondary | ICD-10-CM

## 2021-05-10 DIAGNOSIS — Z888 Allergy status to other drugs, medicaments and biological substances status: Secondary | ICD-10-CM

## 2021-05-10 DIAGNOSIS — R6521 Severe sepsis with septic shock: Secondary | ICD-10-CM

## 2021-05-10 DIAGNOSIS — Z87442 Personal history of urinary calculi: Secondary | ICD-10-CM

## 2021-05-10 DIAGNOSIS — Z79899 Other long term (current) drug therapy: Secondary | ICD-10-CM

## 2021-05-10 DIAGNOSIS — N1832 Chronic kidney disease, stage 3b: Secondary | ICD-10-CM | POA: Diagnosis present

## 2021-05-10 DIAGNOSIS — E1151 Type 2 diabetes mellitus with diabetic peripheral angiopathy without gangrene: Secondary | ICD-10-CM | POA: Diagnosis present

## 2021-05-10 DIAGNOSIS — R319 Hematuria, unspecified: Secondary | ICD-10-CM | POA: Diagnosis present

## 2021-05-10 DIAGNOSIS — Z789 Other specified health status: Secondary | ICD-10-CM | POA: Diagnosis not present

## 2021-05-10 DIAGNOSIS — G2581 Restless legs syndrome: Secondary | ICD-10-CM | POA: Diagnosis present

## 2021-05-10 DIAGNOSIS — Z794 Long term (current) use of insulin: Secondary | ICD-10-CM

## 2021-05-10 DIAGNOSIS — J9621 Acute and chronic respiratory failure with hypoxia: Secondary | ICD-10-CM | POA: Diagnosis present

## 2021-05-10 DIAGNOSIS — Z66 Do not resuscitate: Secondary | ICD-10-CM | POA: Diagnosis present

## 2021-05-10 DIAGNOSIS — I5032 Chronic diastolic (congestive) heart failure: Secondary | ICD-10-CM | POA: Diagnosis present

## 2021-05-10 DIAGNOSIS — N390 Urinary tract infection, site not specified: Secondary | ICD-10-CM | POA: Diagnosis present

## 2021-05-10 DIAGNOSIS — J9690 Respiratory failure, unspecified, unspecified whether with hypoxia or hypercapnia: Secondary | ICD-10-CM | POA: Diagnosis present

## 2021-05-10 DIAGNOSIS — E119 Type 2 diabetes mellitus without complications: Secondary | ICD-10-CM | POA: Diagnosis not present

## 2021-05-10 DIAGNOSIS — K219 Gastro-esophageal reflux disease without esophagitis: Secondary | ICD-10-CM | POA: Diagnosis present

## 2021-05-10 DIAGNOSIS — M109 Gout, unspecified: Secondary | ICD-10-CM | POA: Diagnosis present

## 2021-05-10 DIAGNOSIS — Z87891 Personal history of nicotine dependence: Secondary | ICD-10-CM

## 2021-05-10 DIAGNOSIS — E871 Hypo-osmolality and hyponatremia: Secondary | ICD-10-CM | POA: Diagnosis present

## 2021-05-10 DIAGNOSIS — E8809 Other disorders of plasma-protein metabolism, not elsewhere classified: Secondary | ICD-10-CM | POA: Diagnosis present

## 2021-05-10 DIAGNOSIS — E1122 Type 2 diabetes mellitus with diabetic chronic kidney disease: Secondary | ICD-10-CM | POA: Diagnosis present

## 2021-05-10 DIAGNOSIS — Z8616 Personal history of COVID-19: Secondary | ICD-10-CM

## 2021-05-10 DIAGNOSIS — D61818 Other pancytopenia: Secondary | ICD-10-CM | POA: Diagnosis present

## 2021-05-10 DIAGNOSIS — G4733 Obstructive sleep apnea (adult) (pediatric): Secondary | ICD-10-CM

## 2021-05-10 DIAGNOSIS — R6520 Severe sepsis without septic shock: Secondary | ICD-10-CM

## 2021-05-10 DIAGNOSIS — J9601 Acute respiratory failure with hypoxia: Secondary | ICD-10-CM

## 2021-05-10 DIAGNOSIS — I130 Hypertensive heart and chronic kidney disease with heart failure and stage 1 through stage 4 chronic kidney disease, or unspecified chronic kidney disease: Secondary | ICD-10-CM | POA: Diagnosis present

## 2021-05-10 DIAGNOSIS — K589 Irritable bowel syndrome without diarrhea: Secondary | ICD-10-CM | POA: Diagnosis present

## 2021-05-10 DIAGNOSIS — Z6841 Body Mass Index (BMI) 40.0 and over, adult: Secondary | ICD-10-CM | POA: Diagnosis not present

## 2021-05-10 DIAGNOSIS — E875 Hyperkalemia: Secondary | ICD-10-CM | POA: Diagnosis present

## 2021-05-10 DIAGNOSIS — J188 Other pneumonia, unspecified organism: Secondary | ICD-10-CM | POA: Diagnosis present

## 2021-05-10 DIAGNOSIS — J189 Pneumonia, unspecified organism: Secondary | ICD-10-CM | POA: Diagnosis present

## 2021-05-10 DIAGNOSIS — Z86010 Personal history of colonic polyps: Secondary | ICD-10-CM

## 2021-05-10 DIAGNOSIS — J449 Chronic obstructive pulmonary disease, unspecified: Secondary | ICD-10-CM | POA: Diagnosis not present

## 2021-05-10 DIAGNOSIS — Z7189 Other specified counseling: Secondary | ICD-10-CM | POA: Diagnosis not present

## 2021-05-10 DIAGNOSIS — I2510 Atherosclerotic heart disease of native coronary artery without angina pectoris: Secondary | ICD-10-CM

## 2021-05-10 DIAGNOSIS — Z711 Person with feared health complaint in whom no diagnosis is made: Secondary | ICD-10-CM | POA: Diagnosis not present

## 2021-05-10 DIAGNOSIS — E559 Vitamin D deficiency, unspecified: Secondary | ICD-10-CM | POA: Diagnosis present

## 2021-05-10 DIAGNOSIS — Z8249 Family history of ischemic heart disease and other diseases of the circulatory system: Secondary | ICD-10-CM

## 2021-05-10 DIAGNOSIS — E785 Hyperlipidemia, unspecified: Secondary | ICD-10-CM | POA: Diagnosis present

## 2021-05-10 DIAGNOSIS — Z515 Encounter for palliative care: Secondary | ICD-10-CM | POA: Diagnosis not present

## 2021-05-10 DIAGNOSIS — I252 Old myocardial infarction: Secondary | ICD-10-CM

## 2021-05-10 DIAGNOSIS — Z20822 Contact with and (suspected) exposure to covid-19: Secondary | ICD-10-CM | POA: Diagnosis present

## 2021-05-10 DIAGNOSIS — Z85820 Personal history of malignant melanoma of skin: Secondary | ICD-10-CM

## 2021-05-10 DIAGNOSIS — Z841 Family history of disorders of kidney and ureter: Secondary | ICD-10-CM

## 2021-05-10 DIAGNOSIS — B9620 Unspecified Escherichia coli [E. coli] as the cause of diseases classified elsewhere: Secondary | ICD-10-CM | POA: Diagnosis present

## 2021-05-10 DIAGNOSIS — R300 Dysuria: Secondary | ICD-10-CM | POA: Diagnosis present

## 2021-05-10 DIAGNOSIS — A419 Sepsis, unspecified organism: Secondary | ICD-10-CM | POA: Diagnosis present

## 2021-05-10 LAB — CBC WITH DIFFERENTIAL/PLATELET
Abs Immature Granulocytes: 0 10*3/uL (ref 0.00–0.07)
Basophils Absolute: 0 10*3/uL (ref 0.0–0.1)
Basophils Relative: 0 %
Eosinophils Absolute: 0 10*3/uL (ref 0.0–0.5)
Eosinophils Relative: 0 %
HCT: 23.4 % — ABNORMAL LOW (ref 36.0–46.0)
Hemoglobin: 7.4 g/dL — ABNORMAL LOW (ref 12.0–15.0)
Lymphocytes Relative: 50 %
Lymphs Abs: 0.5 10*3/uL — ABNORMAL LOW (ref 0.7–4.0)
MCH: 34.6 pg — ABNORMAL HIGH (ref 26.0–34.0)
MCHC: 31.6 g/dL (ref 30.0–36.0)
MCV: 109.3 fL — ABNORMAL HIGH (ref 80.0–100.0)
Monocytes Absolute: 0 10*3/uL — ABNORMAL LOW (ref 0.1–1.0)
Monocytes Relative: 2 %
Neutro Abs: 0.5 10*3/uL — ABNORMAL LOW (ref 1.7–7.7)
Neutrophils Relative %: 48 %
Platelets: 61 10*3/uL — ABNORMAL LOW (ref 150–400)
RBC: 2.14 MIL/uL — ABNORMAL LOW (ref 3.87–5.11)
RDW: 18.2 % — ABNORMAL HIGH (ref 11.5–15.5)
WBC: 1 10*3/uL — CL (ref 4.0–10.5)
nRBC: 0 % (ref 0.0–0.2)
nRBC: 2 /100 WBC — ABNORMAL HIGH

## 2021-05-10 LAB — RESP PANEL BY RT-PCR (FLU A&B, COVID) ARPGX2
Influenza A by PCR: NEGATIVE
Influenza B by PCR: NEGATIVE
SARS Coronavirus 2 by RT PCR: NEGATIVE

## 2021-05-10 LAB — COMPREHENSIVE METABOLIC PANEL
ALT: 18 U/L (ref 0–44)
AST: 38 U/L (ref 15–41)
Albumin: 3.1 g/dL — ABNORMAL LOW (ref 3.5–5.0)
Alkaline Phosphatase: 68 U/L (ref 38–126)
Anion gap: 18 — ABNORMAL HIGH (ref 5–15)
BUN: 23 mg/dL (ref 8–23)
CO2: 25 mmol/L (ref 22–32)
Calcium: 9.1 mg/dL (ref 8.9–10.3)
Chloride: 91 mmol/L — ABNORMAL LOW (ref 98–111)
Creatinine, Ser: 1.85 mg/dL — ABNORMAL HIGH (ref 0.44–1.00)
GFR, Estimated: 27 mL/min — ABNORMAL LOW (ref >60)
Glucose, Bld: 493 mg/dL — ABNORMAL HIGH (ref 70–99)
Potassium: 5.3 mmol/L — ABNORMAL HIGH (ref 3.5–5.1)
Sodium: 134 mmol/L — ABNORMAL LOW (ref 135–145)
Total Bilirubin: 1.6 mg/dL — ABNORMAL HIGH (ref 0.3–1.2)
Total Protein: 6.8 g/dL (ref 6.5–8.1)

## 2021-05-10 LAB — URINALYSIS, ROUTINE W REFLEX MICROSCOPIC
Bilirubin Urine: NEGATIVE
Glucose, UA: >=500 mg/dL — AB
Hgb urine dipstick: NEGATIVE
Ketones, ur: NEGATIVE mg/dL
Leukocytes,Ua: NEGATIVE
Nitrite: NEGATIVE
Protein, ur: >=300 mg/dL — AB
Specific Gravity, Urine: 1.018 (ref 1.005–1.030)
pH: 5 (ref 5.0–8.0)

## 2021-05-10 LAB — CBG MONITORING, ED: Glucose-Capillary: 458 mg/dL — ABNORMAL HIGH (ref 70–99)

## 2021-05-10 LAB — I-STAT VENOUS BLOOD GAS, ED
Acid-base deficit: 2 mmol/L (ref 0.0–2.0)
Bicarbonate: 27.2 mmol/L (ref 20.0–28.0)
Calcium, Ion: 1.07 mmol/L — ABNORMAL LOW (ref 1.15–1.40)
HCT: 49 % — ABNORMAL HIGH (ref 36.0–46.0)
Hemoglobin: 16.7 g/dL — ABNORMAL HIGH (ref 12.0–15.0)
O2 Saturation: 94 %
Potassium: 5.1 mmol/L (ref 3.5–5.1)
Sodium: 131 mmol/L — ABNORMAL LOW (ref 135–145)
TCO2: 29 mmol/L (ref 22–32)
pCO2, Ven: 62.8 mmHg — ABNORMAL HIGH (ref 44.0–60.0)
pH, Ven: 7.244 — ABNORMAL LOW (ref 7.250–7.430)
pO2, Ven: 83 mmHg — ABNORMAL HIGH (ref 32.0–45.0)

## 2021-05-10 LAB — LACTIC ACID, PLASMA: Lactic Acid, Venous: 8.3 mmol/L (ref 0.5–1.9)

## 2021-05-10 LAB — I-STAT CHEM 8, ED
BUN: 29 mg/dL — ABNORMAL HIGH (ref 8–23)
Calcium, Ion: 1.07 mmol/L — ABNORMAL LOW (ref 1.15–1.40)
Chloride: 93 mmol/L — ABNORMAL LOW (ref 98–111)
Creatinine, Ser: 1.6 mg/dL — ABNORMAL HIGH (ref 0.44–1.00)
Glucose, Bld: 491 mg/dL — ABNORMAL HIGH (ref 70–99)
HCT: 52 % — ABNORMAL HIGH (ref 36.0–46.0)
Hemoglobin: 17.7 g/dL — ABNORMAL HIGH (ref 12.0–15.0)
Potassium: 5.2 mmol/L — ABNORMAL HIGH (ref 3.5–5.1)
Sodium: 132 mmol/L — ABNORMAL LOW (ref 135–145)
TCO2: 26 mmol/L (ref 22–32)

## 2021-05-10 LAB — PROTIME-INR
INR: 1.2 (ref 0.8–1.2)
Prothrombin Time: 15.2 seconds (ref 11.4–15.2)

## 2021-05-10 LAB — APTT: aPTT: 26 seconds (ref 24–36)

## 2021-05-10 MED ORDER — LACTATED RINGERS IV SOLN: INTRAVENOUS | Status: DC

## 2021-05-10 MED ORDER — VANCOMYCIN HCL 2000 MG/400ML IV SOLN
2000.0000 mg | Freq: Once | INTRAVENOUS | Status: AC
  Administered 2021-05-10: 2000 mg via INTRAVENOUS
  Filled 2021-05-10: qty 400

## 2021-05-10 MED ORDER — HYDROMORPHONE HCL 1 MG/ML IJ SOLN: 0.5000 mg | INTRAMUSCULAR | Status: DC | PRN

## 2021-05-10 MED ORDER — VANCOMYCIN HCL IN DEXTROSE 1-5 GM/200ML-% IV SOLN: 1000.0000 mg | Freq: Once | INTRAVENOUS | Status: DC

## 2021-05-10 MED ORDER — ONDANSETRON HCL 4 MG/2ML IJ SOLN
4.0000 mg | Freq: Four times a day (QID) | INTRAMUSCULAR | Status: DC | PRN
  Administered 2021-05-10: 4 mg via INTRAVENOUS
  Filled 2021-05-10: qty 2

## 2021-05-10 MED ORDER — LORAZEPAM 2 MG/ML IJ SOLN: 1.0000 mg | INTRAMUSCULAR | Status: DC | PRN

## 2021-05-10 MED ORDER — GLYCOPYRROLATE 0.2 MG/ML IJ SOLN: 0.2000 mg | INTRAMUSCULAR | Status: DC | PRN

## 2021-05-10 MED ORDER — OLANZAPINE 5 MG PO TBDP
5.0000 mg | ORAL_TABLET | Freq: Every day | ORAL | Status: DC
  Administered 2021-05-10: 5 mg via ORAL
  Filled 2021-05-10: qty 1

## 2021-05-10 MED ORDER — GLYCOPYRROLATE 1 MG PO TABS
1.0000 mg | ORAL_TABLET | ORAL | Status: DC | PRN
  Filled 2021-05-10: qty 1

## 2021-05-10 MED ORDER — LORAZEPAM 2 MG/ML PO CONC: 1.0000 mg | ORAL | Status: DC | PRN

## 2021-05-10 MED ORDER — LACTATED RINGERS IV BOLUS
1000.0000 mL | Freq: Once | INTRAVENOUS | Status: AC
  Administered 2021-05-10: 1000 mL via INTRAVENOUS

## 2021-05-10 MED ORDER — LACTATED RINGERS IV BOLUS (SEPSIS)
1000.0000 mL | Freq: Once | INTRAVENOUS | Status: AC
  Administered 2021-05-10: 1000 mL via INTRAVENOUS

## 2021-05-10 MED ORDER — PIPERACILLIN-TAZOBACTAM 3.375 G IVPB
3.3750 g | Freq: Once | INTRAVENOUS | Status: AC
  Administered 2021-05-10: 3.375 g via INTRAVENOUS
  Filled 2021-05-10: qty 50

## 2021-05-10 MED ORDER — LORAZEPAM 1 MG PO TABS: 1.0000 mg | ORAL_TABLET | ORAL | Status: DC | PRN

## 2021-05-10 MED ORDER — ONDANSETRON 4 MG PO TBDP: 4.0000 mg | ORAL_TABLET | Freq: Four times a day (QID) | ORAL | Status: DC | PRN

## 2021-05-10 MED ORDER — IPRATROPIUM-ALBUTEROL 0.5-2.5 (3) MG/3ML IN SOLN
3.0000 mL | Freq: Four times a day (QID) | RESPIRATORY_TRACT | Status: DC
  Administered 2021-05-10: 3 mL via RESPIRATORY_TRACT
  Filled 2021-05-10: qty 3

## 2021-05-10 NOTE — ED Provider Notes (Signed)
[facility] EMERGENCY DEPARTMENT Provider Note   CSN: [id number] Arrival date & time: 05/10/21  2106     History  Chief Complaint  Patient presents with   Shortness of Breath    Janet Choi is a 81 y.o. 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  According to the patient's sister, she found the patient at home confused and saturating 50% on her home 3 L nasal cannula.  EMS was called and they placed her on CPAP and gave her a breathing treatment in route to the emergency department.  The patient's sister said that although the patient is DNR and on hospice, she would want antibiotics to treat a pneumonia.   Home Medications Prior to Admission medications   Medication Sig Start Date End Date Taking? Authorizing Provider  acetaminophen (TYLENOL) 500 MG tablet Take 1,000 mg by mouth every 6 (six) hours as needed for headache (pain).    [provider]  albuterol (VENTOLIN HFA) 108 (90 Base) MCG/ACT inhaler Inhale 2 puffs into the lungs every 4 (four) hours as needed for wheezing or shortness of breath. 09/07/19   Vladimir Crofts, PA-C  Ascorbic Acid (VITAMIN C) 1000 MG tablet Take 1,000 mg by mouth 2 (two) times daily.    [provider]  BD PEN NEEDLE NANO 2ND GEN 32G X 4 MM MISC USE AS DIRECTED 4 TIMES A DAY Patient taking differently: 1 each by Other route in the morning, at noon, in the evening, and at bedtime. 06/06/20   [name], MD  benzonatate (TESSALON) 100 MG capsule Take 1 perle  3 x /day  as needed to Prevent Cough  /  Patient knows to take by mouth 12/20/20   [name], MD  Cholecalciferol 125 MCG (5000 UT) capsule Take 5,000 Units by mouth 2 (two) times daily.     [provider]  Continuous Blood Gluc Receiver (FREESTYLE LIBRE 2 READER) DEVI Use to test blood sugar  multiple times a day. 10/16/20   Liane Comber, NP  Continuous Blood Gluc Sensor (FREESTYLE LIBRE 2 SENSOR) MISC Use to check blood sugar multiple times a day 10/16/20   Liane Comber, NP  diclofenac sodium (VOLTAREN) 1 % GEL Apply 4 g topically 4 (four) times daily. Patient taking differently: Apply 4 g topically 2 (two) times daily as needed (pain). 01/18/18   Vladimir Crofts, PA-C  escitalopram (LEXAPRO) 20 MG tablet Take 1 tablet (20 mg total) by mouth daily. 01/08/20 01/07/21  Garnet Sierras, NP  ferrous sulfate 325 (65 FE) MG tablet Take 325 mg by mouth 2 (two) times daily with a meal.    [provider]  furosemide (LASIX) 40 MG tablet Take 40 mg by mouth daily as needed for fluid or edema.    [provider]  gabapentin (NEURONTIN) 300 MG capsule Take  1 capsule   3  x /day  for Painful Diabetic Neuropathy 11/04/20   [name], MD  hydrOXYzine Pamoate (VISTARIL PO) Take 1 tablet by mouth 4 (four) times daily as needed. ? 25 mg, unsure    [provider]  Insulin Glargine (BASAGLAR KWIKPEN) 100 UNIT/ML INJECT 70 UNITS DAILY FOR DIABETES 04/29/21   Magda Bernheim, NP  ipratropium (ATROVENT) 0.06 % nasal spray Use 1 to 2 sprays each nostril 2 to 3 x /day as needed 11/04/20  [name], MD  ipratropium-albuterol (DUONEB) 0.5-2.5 (3) MG/3ML SOLN Take 3 mLs by nebulization 3 (three) times daily. 07/31/20   Sheikh, Omair Latif, DO  ketoconazole (NIZORAL) 2 % cream APPLY TO AFFECTED AREA TWICE A DAY Patient taking differently: Apply 1 application topically 2 (two) times daily. For facial itching 11/16/19   [name], MD  Lancets [facility] ULTRASOFT) lancets Use as instructed 04/27/16   Philemon Kingdom, MD  metolazone (ZAROXOLYN) 10 MG tablet Take 1/2 to 1 tablet  Daily for Edema / Swelling of Legs 11/04/20   [name], MD  metoprolol succinate (TOPROL-XL) 25 MG 24 hr tablet Take 1 tablet (25 mg total) by mouth at bedtime. 07/25/19   Duke, Tami Lin, PA  nitroGLYCERIN (NITROSTAT) 0.4 MG SL tablet Place 1 tablet (0.4 mg total) under the tongue every 5 (five) minutes x 3 doses as needed for chest pain. 07/31/20   Sheikh, Omair Latif, DO  NOVOLOG FLEXPEN 100 UNIT/ML FlexPen INJECT 20 UNITS UNDER SKIN THREE TIMES DAILY BEFORE MEALS, IF CBG >130 INCREASE TO 25 UNITS 01/07/21   [name], MD  nystatin (MYCOSTATIN) 100000 UNIT/ML suspension 5 ml four times a day, retain in mouth as long as possible (Swish and Swallow).  Use for 48 hours after symptoms resolve. Patient taking differently: Use as directed 5 mLs in the mouth or throat See admin instructions. Take 5 ml by mouth four times a day, retain in mouth as long as possible (Swish and Swallow).  Use for 48 hours after symptoms resolve. 07/23/20   Garnet Sierras, NP  ondansetron (ZOFRAN) 4 MG tablet Take 1 tablet (4 mg total) by mouth every 6 (six) hours as needed for nausea. 07/31/20   Sheikh, Omair Latif, DO  ONETOUCH VERIO test strip USE AS INSTRUCTED TO CHECK SUGAR 2 TIMES DAILY. Patient taking differently: 1 each by Other route in the morning and at bedtime. 09/21/17   Philemon Kingdom, MD  OXYGEN Inhale 2 L into the lungs continuous.    [provider]  pantoprazole (PROTONIX) 40 MG tablet Take 1 tablet (40 mg total) by mouth 2 (two) times daily as needed. For acid reflux. 06/12/20 06/12/21  Liane Comber, NP  Polyethylene Glycol 400 (BLINK TEARS OP) Place 1 drop into both eyes daily as needed (dry eyes).     [provider]  rOPINIRole (REQUIP) 3 MG tablet TAKE 1 TABLET 4 TIMES A DAY FOR RESTLESS LEGS 04/13/21   Liane Comber, NP  tiZANidine (ZANAFLEX) 4 MG tablet Take 2 mg by mouth at bedtime as needed for muscle spasms. 07/25/20   [provider]  traMADol Veatrice Bourbon) 50 MG tablet Take 1 tablet every 4 hours to prevent severe cough 12/26/20   [name], MD  triamcinolone (NASACORT) 55 MCG/ACT AERO nasal inhaler Place 2 sprays into the nose at  bedtime. Patient taking differently: Place 2 sprays into the nose 2 (two) times daily. 04/16/20 04/16/21  Garnet Sierras, NP  triamcinolone ointment (KENALOG) 0.1 % Apply 1 application topically 2 (two) times daily. 10/08/20   Liane Comber, NP  VICTOZA 18 MG/3ML SOPN INJECT 1.2 MG INTO THE SKIN DAILY AT 12 NOON. 04/29/21   Magda Bernheim, NP      Allergies    Brilinta [ticagrelor], Aspirin, Nsaids, Minocycline hcl, Oruvail [ketoprofen], Tricor [fenofibrate], Vasotec [enalaprilat], and Zinc    Review of Systems   Review of Systems  Unable to perform ROS: Mental status change   Physical Exam Updated Vital Signs BP (!) 83/45  Pulse 98    Temp 98.8 F (37.1 C) (Temporal)    Resp (!) 36    Ht 5\' 1"  (1.549 m)    Wt 113.9 kg    SpO2 95%    BMI 47.45 kg/m  Physical Exam Vitals and nursing note reviewed.  Constitutional:      General: She is in acute distress.     Appearance: She is well-developed. She is obese. She is ill-appearing and toxic-appearing.  HENT:     Head: Normocephalic and atraumatic.     Right Ear: External ear normal.     Left Ear: External ear normal.     Nose: Nose normal.     Mouth/Throat:     Pharynx: Oropharynx is clear.  Eyes:     Extraocular Movements: Extraocular movements intact.     Conjunctiva/sclera: Conjunctivae normal.     Pupils: Pupils are equal, round, and reactive to light.  Cardiovascular:     Rate and Rhythm: Regular rhythm. Tachycardia present.     Pulses: Normal pulses.     Heart sounds: Normal heart sounds. No murmur heard. Pulmonary:     Effort: Tachypnea, accessory muscle usage, prolonged expiration and respiratory distress present.     Breath sounds: Wheezing and rhonchi present.  Abdominal:     General: There is no distension.     Palpations: Abdomen is soft.     Tenderness: There is no abdominal tenderness.  Musculoskeletal:        General: No swelling.     Cervical back: Neck supple.  Skin:    General: Skin is warm and dry.      Capillary Refill: Capillary refill takes less than 2 seconds.  Neurological:     General: No focal deficit present.     Mental Status: She is lethargic.     GCS: GCS eye subscore is 3. GCS verbal subscore is 1. GCS motor subscore is 4.    ED Results / Procedures / Treatments   Labs (all labs ordered are listed, but only abnormal results are displayed) Labs Reviewed  LACTIC ACID, PLASMA - Abnormal; Notable for the following components:      Result Value   Lactic Acid, Venous 8.3 (*)    All other components within normal limits  COMPREHENSIVE METABOLIC PANEL - Abnormal; Notable for the following components:   Sodium 134 (*)    Potassium 5.3 (*)    Chloride 91 (*)    Glucose, Bld 493 (*)    Creatinine, Ser 1.85 (*)    Albumin 3.1 (*)    Total Bilirubin 1.6 (*)    GFR, Estimated 27 (*)    Anion gap 18 (*)    All other components within normal limits  URINALYSIS, ROUTINE W REFLEX MICROSCOPIC - Abnormal; Notable for the following components:   APPearance HAZY (*)    Glucose, UA >=500 (*)    Protein, ur >=300 (*)    Bacteria, UA RARE (*)    All other components within normal limits  CBC WITH DIFFERENTIAL/PLATELET - Abnormal; Notable for the following components:   WBC 1.0 (*)    RBC 2.14 (*)    Hemoglobin 7.4 (*)    HCT 23.4 (*)    MCV 109.3 (*)    MCH 34.6 (*)    RDW 18.2 (*)    Platelets 61 (*)    Neutro Abs 0.5 (*)    Lymphs Abs 0.5 (*)    Monocytes Absolute 0.0 (*)    nRBC 2 (*)  All other components within normal limits  CBG MONITORING, ED - Abnormal; Notable for the following components:   Glucose-Capillary 458 (*)    All other components within normal limits  I-STAT CHEM 8, ED - Abnormal; Notable for the following components:   Sodium 132 (*)    Potassium 5.2 (*)    Chloride 93 (*)    BUN 29 (*)    Creatinine, Ser 1.60 (*)    Glucose, Bld 491 (*)    Calcium, Ion 1.07 (*)    Hemoglobin 17.7 (*)    HCT 52.0 (*)    All other components within normal limits   I-STAT VENOUS BLOOD GAS, ED - Abnormal; Notable for the following components:   pH, Ven 7.244 (*)    pCO2, Ven 62.8 (*)    pO2, Ven 83.0 (*)    Sodium 131 (*)    Calcium, Ion 1.07 (*)    HCT 49.0 (*)    Hemoglobin 16.7 (*)    All other components within normal limits  RESP PANEL BY RT-PCR (FLU A&B, COVID) ARPGX2  CULTURE, BLOOD (ROUTINE X 2)  CULTURE, BLOOD (ROUTINE X 2)  URINE CULTURE  PROTIME-INR  APTT  LACTIC ACID, PLASMA  CBC WITH DIFFERENTIAL/PLATELET  PATHOLOGIST SMEAR REVIEW    EKG EKG Interpretation  Date/Time:  Sunday May 10 2021 21:11:00 EST Ventricular Rate:  136 PR Interval:  117 QRS Duration: 119 QT Interval:  295 QTC Calculation: 444 R Axis:   -82 Text Interpretation: Sinus tachycardia Incomplete right bundle branch block Inferior infarct, old Anterior infarct, old Lateral leads are also involved Otherwise no significant change Confirmed by Deno Etienne [phone]) on 05/10/2021 9:29:47 PM  Radiology DG Chest Portable 1 View  Result Date: 05/10/2021 CLINICAL DATA:  Shortness of breath EXAM: PORTABLE CHEST 1 VIEW COMPARISON:  Chest x-ray 07/26/2020, CT chest 07/27/2020, chest x-ray 12/26/2019 FINDINGS: The heart and mediastinal contours are unchanged. Aortic calcification Patchy airspace opacities within bilateral mid to lower lung zone. No pulmonary edema. Likely trace pleural effusions. No pneumothorax. No acute osseous abnormality. IMPRESSION: 1. Findings suggestive of multifocal pneumonia. Likely trace pleural effusions. Followup PA and lateral chest X-ray is recommended in 3-4 weeks following therapy to ensure resolution and exclude underlying malignancy. 2.  Aortic Atherosclerosis (ICD10-I70.0). Electronically Signed   By: Iven Finn M.D.   On: 05/10/2021 21:29    Procedures Procedures   Medications Ordered in ED Medications  lactated ringers infusion ( Intravenous New Bag/Given 05/10/21 2149)  vancomycin (VANCOREADY) IVPB 2000 mg/400 mL (2,000 mg  Intravenous New Bag/Given 05/10/21 2201)  HYDROmorphone (DILAUDID) injection 0.5 mg (has no administration in time range)  LORazepam (ATIVAN) tablet 1 mg (has no administration in time range)    Or  LORazepam (ATIVAN) 2 MG/ML concentrated solution 1 mg (has no administration in time range)    Or  LORazepam (ATIVAN) injection 1 mg (has no administration in time range)  OLANZapine zydis (ZYPREXA) disintegrating tablet 5 mg (5 mg Oral Given 05/10/21 2223)  ondansetron (ZOFRAN-ODT) disintegrating tablet 4 mg ( Oral See Alternative 05/10/21 2223)    Or  ondansetron (ZOFRAN) injection 4 mg (4 mg Intravenous Given 05/10/21 2223)  glycopyrrolate (ROBINUL) tablet 1 mg (has no administration in time range)    Or  glycopyrrolate (ROBINUL) injection 0.2 mg (has no administration in time range)    Or  glycopyrrolate (ROBINUL) injection 0.2 mg (has no administration in time range)  ipratropium-albuterol (DUONEB) 0.5-2.5 (3) MG/3ML nebulizer solution 3 mL (3 mLs Nebulization Given  05/10/21 2230)  lactated ringers bolus 1,000 mL (0 mLs Intravenous Stopped 05/10/21 2232)  piperacillin-tazobactam (ZOSYN) IVPB 3.375 g (0 g Intravenous Stopped 05/10/21 2229)  lactated ringers bolus 1,000 mL (1,000 mLs Intravenous New Bag/Given 05/10/21 2324)    ED Course/ Medical Decision Making/ A&P                            Patient presents in respiratory distress as described in HPI above.  Patient was switched over from CPAP to BiPAP on arrival to the emergency department with improvement in work of breathing and mentation.  I discussed the patient's condition with the patient's sister at bedside.  I explained that even if we treated the patient, she may not survive this illness due to her terminal condition and comorbidities.  The patient's mentation improved on BiPAP enough to shake her head "no" when asked if she would want to have chest compressions or a breathing tube put in.  She nods her head "yes" when asked if she would like to  have antibiotics to treat a potential infection.  She also said that she would like to be as comfortable as possible at this time.  Comfort care measures were discussed with the patient sister at bedside and it was decided that we would make the patient comfortable while trialing antibiotics to see if she improved.  We will not pursue intubation per the patient's wishes and her mental status does seem to be improving on BiPAP.    CXR consistent with multifocal pneumonia.  Tachycardia, tachypnea, acute hypoxic respiratory failure, and pneumonia on CXR support diagnosis of sepsis.  Sepsis labs and antibiotics ordered.  1 L LR and LR infusion at 150 cc an hour ordered.  CBC with pancytopenia consistent with known AML.  Excited to be able to leave lactic acid elevated at 8.3.  CMP with multiple electrolyte abnormalities including hyponatremia to 134, hyperkalemia to 5.3, hypochloremia to 91.  Renal function appears close to baseline.  Patient is also significantly hyperglycemic to blood glucose 493.  Likely stress hyperglycemia in the setting of severe sepsis.  Notified by nursing that the patient's blood pressure has dropped to systolic in the 58K.  We had initially limited fluid resuscitation due to patient's history of HFpEF and absence of shock on arrival.  Will administer another 1 L LR for hypotension and lactic acidosis  Discussed the patient with hospitalist who will admit the patient with plan to engage palliative care tomorrow.  Final Clinical Impression(s) / ED Diagnoses Final diagnoses:  Sepsis with acute hypoxic respiratory failure, due to unspecified organism, unspecified whether septic shock present [facility])    Rx / DC Orders ED Discharge Orders     None         Raechal Raben, Amalia Hailey, MD 05/10/21 [place], [place], DO 05/10/21 2350

## 2021-05-10 NOTE — ED Notes (Signed)
Provider aware of bp, fluids ordered and started

## 2021-05-10 NOTE — ED Notes (Signed)
Provider on phone w/ sister

## 2021-05-10 NOTE — Progress Notes (Signed)
Elink following code sepsis °

## 2021-05-10 NOTE — Progress Notes (Signed)
Pt placed on bipap per MD verbal at bedside.

## 2021-05-10 NOTE — ED Triage Notes (Addendum)
Per [facility], hospice pt from home.  Was stat 50% on home 3L [place].  No access

## 2021-05-10 NOTE — ED Notes (Signed)
Provider aware of labs

## 2021-05-11 ENCOUNTER — Encounter (HOSPITAL_COMMUNITY): Payer: Self-pay | Admitting: Family Medicine

## 2021-05-11 DIAGNOSIS — R6521 Severe sepsis with septic shock: Secondary | ICD-10-CM | POA: Diagnosis present

## 2021-05-11 DIAGNOSIS — Z515 Encounter for palliative care: Secondary | ICD-10-CM

## 2021-05-11 DIAGNOSIS — Z711 Person with feared health complaint in whom no diagnosis is made: Secondary | ICD-10-CM

## 2021-05-11 DIAGNOSIS — C9200 Acute myeloblastic leukemia, not having achieved remission: Secondary | ICD-10-CM | POA: Diagnosis not present

## 2021-05-11 DIAGNOSIS — Z789 Other specified health status: Secondary | ICD-10-CM

## 2021-05-11 DIAGNOSIS — J189 Pneumonia, unspecified organism: Secondary | ICD-10-CM | POA: Diagnosis present

## 2021-05-11 DIAGNOSIS — R0602 Shortness of breath: Secondary | ICD-10-CM

## 2021-05-11 DIAGNOSIS — J188 Other pneumonia, unspecified organism: Secondary | ICD-10-CM | POA: Diagnosis present

## 2021-05-11 DIAGNOSIS — Z66 Do not resuscitate: Secondary | ICD-10-CM

## 2021-05-11 DIAGNOSIS — Z7189 Other specified counseling: Secondary | ICD-10-CM

## 2021-05-11 DIAGNOSIS — A419 Sepsis, unspecified organism: Secondary | ICD-10-CM | POA: Insufficient documentation

## 2021-05-11 DIAGNOSIS — J9690 Respiratory failure, unspecified, unspecified whether with hypoxia or hypercapnia: Secondary | ICD-10-CM | POA: Diagnosis present

## 2021-05-11 DIAGNOSIS — J9621 Acute and chronic respiratory failure with hypoxia: Secondary | ICD-10-CM | POA: Diagnosis not present

## 2021-05-11 LAB — LACTIC ACID, PLASMA
Lactic Acid, Venous: 6.5 mmol/L (ref 0.5–1.9)
Lactic Acid, Venous: 5 mmol/L (ref 0.5–1.9)

## 2021-05-11 LAB — CBG MONITORING, ED
Glucose-Capillary: 429 mg/dL — ABNORMAL HIGH (ref 70–99)
Glucose-Capillary: 431 mg/dL — ABNORMAL HIGH (ref 70–99)
Glucose-Capillary: 357 mg/dL — ABNORMAL HIGH (ref 70–99)
Glucose-Capillary: 297 mg/dL — ABNORMAL HIGH (ref 70–99)

## 2021-05-11 LAB — COMPREHENSIVE METABOLIC PANEL
ALT: 100 U/L — ABNORMAL HIGH (ref 0–44)
AST: 259 U/L — ABNORMAL HIGH (ref 15–41)
Albumin: 2.5 g/dL — ABNORMAL LOW (ref 3.5–5.0)
Alkaline Phosphatase: 65 U/L (ref 38–126)
Anion gap: 18 — ABNORMAL HIGH (ref 5–15)
BUN: 29 mg/dL — ABNORMAL HIGH (ref 8–23)
CO2: 23 mmol/L (ref 22–32)
Calcium: 8.3 mg/dL — ABNORMAL LOW (ref 8.9–10.3)
Chloride: 92 mmol/L — ABNORMAL LOW (ref 98–111)
Creatinine, Ser: 1.95 mg/dL — ABNORMAL HIGH (ref 0.44–1.00)
GFR, Estimated: 26 mL/min — ABNORMAL LOW (ref >60)
Glucose, Bld: 466 mg/dL — ABNORMAL HIGH (ref 70–99)
Potassium: 5 mmol/L (ref 3.5–5.1)
Sodium: 133 mmol/L — ABNORMAL LOW (ref 135–145)
Total Bilirubin: 2.4 mg/dL — ABNORMAL HIGH (ref 0.3–1.2)
Total Protein: 5.6 g/dL — ABNORMAL LOW (ref 6.5–8.1)

## 2021-05-11 LAB — TYPE AND SCREEN
ABO/RH(D): AB POS
Antibody Screen: NEGATIVE
Unit division: 0

## 2021-05-11 LAB — BPAM RBC
Blood Product Expiration Date: 202301172359
ISSUE DATE / TIME: 202301020411
Unit Type and Rh: 6200

## 2021-05-11 LAB — PROCALCITONIN: Procalcitonin: 13.66 ng/mL

## 2021-05-11 MED ORDER — INSULIN GLARGINE-YFGN 100 UNIT/ML ~~LOC~~ SOLN
25.0000 [IU] | Freq: Every day | SUBCUTANEOUS | Status: DC
  Administered 2021-05-11: 25 [IU] via SUBCUTANEOUS
  Filled 2021-05-11 (×3): qty 0.25

## 2021-05-11 MED ORDER — HALOPERIDOL LACTATE 2 MG/ML PO CONC
2.0000 mg | Freq: Four times a day (QID) | ORAL | Status: DC | PRN
  Filled 2021-05-11: qty 1

## 2021-05-11 MED ORDER — ACETAMINOPHEN 650 MG RE SUPP: 650.0000 mg | Freq: Four times a day (QID) | RECTAL | Status: DC | PRN

## 2021-05-11 MED ORDER — INSULIN ASPART 100 UNIT/ML IJ SOLN
0.0000 [IU] | INTRAMUSCULAR | Status: DC
  Administered 2021-05-11: 15 [IU] via SUBCUTANEOUS
  Administered 2021-05-11: 8 [IU] via SUBCUTANEOUS
  Administered 2021-05-11: 15 [IU] via SUBCUTANEOUS

## 2021-05-11 MED ORDER — LACTATED RINGERS IV BOLUS
1000.0000 mL | Freq: Once | INTRAVENOUS | Status: AC
  Administered 2021-05-11: 1000 mL via INTRAVENOUS

## 2021-05-11 MED ORDER — SODIUM CHLORIDE 0.9 % IV SOLN
500.0000 mg | INTRAVENOUS | Status: DC
  Administered 2021-05-11: 500 mg via INTRAVENOUS
  Filled 2021-05-11: qty 5

## 2021-05-11 MED ORDER — ONDANSETRON 4 MG PO TBDP: 4.0000 mg | ORAL_TABLET | Freq: Four times a day (QID) | ORAL | Status: DC | PRN

## 2021-05-11 MED ORDER — LORAZEPAM 2 MG/ML IJ SOLN
INTRAMUSCULAR | Status: AC
  Filled 2021-05-11: qty 1

## 2021-05-11 MED ORDER — GLYCOPYRROLATE 0.2 MG/ML IJ SOLN
0.2000 mg | INTRAMUSCULAR | Status: DC | PRN
  Filled 2021-05-11: qty 1

## 2021-05-11 MED ORDER — LORAZEPAM 2 MG/ML IJ SOLN
1.0000 mg | INTRAMUSCULAR | Status: DC | PRN
  Administered 2021-05-11 – 2021-05-12 (×2): 1 mg via INTRAVENOUS
  Filled 2021-05-11 (×3): qty 1

## 2021-05-11 MED ORDER — IPRATROPIUM-ALBUTEROL 0.5-2.5 (3) MG/3ML IN SOLN
3.0000 mL | Freq: Three times a day (TID) | RESPIRATORY_TRACT | Status: DC
  Administered 2021-05-11 – 2021-05-13 (×6): 3 mL via RESPIRATORY_TRACT
  Filled 2021-05-11 (×6): qty 3

## 2021-05-11 MED ORDER — HALOPERIDOL 1 MG PO TABS
2.0000 mg | ORAL_TABLET | Freq: Four times a day (QID) | ORAL | Status: DC | PRN
  Filled 2021-05-11: qty 2

## 2021-05-11 MED ORDER — LORAZEPAM 1 MG PO TABS
1.0000 mg | ORAL_TABLET | ORAL | Status: DC | PRN
  Administered 2021-05-12 – 2021-05-13 (×2): 1 mg via ORAL
  Filled 2021-05-11 (×3): qty 1

## 2021-05-11 MED ORDER — BIOTENE DRY MOUTH MT LIQD
15.0000 mL | Freq: Two times a day (BID) | OROMUCOSAL | Status: DC
  Administered 2021-05-11 – 2021-05-13 (×3): 15 mL via TOPICAL

## 2021-05-11 MED ORDER — SODIUM CHLORIDE 0.9 % IV BOLUS
400.0000 mL | Freq: Once | INTRAVENOUS | Status: AC
  Administered 2021-05-11: 400 mL via INTRAVENOUS

## 2021-05-11 MED ORDER — LORAZEPAM 2 MG/ML PO CONC: 1.0000 mg | ORAL | Status: DC | PRN

## 2021-05-11 MED ORDER — HYDROMORPHONE HCL 1 MG/ML IJ SOLN
0.5000 mg | INTRAMUSCULAR | Status: DC | PRN
  Administered 2021-05-12: 0.5 mg via INTRAVENOUS
  Administered 2021-05-12: 1 mg via INTRAVENOUS
  Filled 2021-05-11 (×2): qty 1

## 2021-05-11 MED ORDER — INSULIN ASPART 100 UNIT/ML IJ SOLN
10.0000 [IU] | Freq: Once | INTRAMUSCULAR | Status: AC
  Administered 2021-05-11: 10 [IU] via SUBCUTANEOUS

## 2021-05-11 MED ORDER — SODIUM CHLORIDE 0.9 % IV SOLN
2.0000 g | Freq: Two times a day (BID) | INTRAVENOUS | Status: DC
  Administered 2021-05-11: 2 g via INTRAVENOUS
  Filled 2021-05-11: qty 2

## 2021-05-11 MED ORDER — HYDROMORPHONE BOLUS VIA INFUSION
1.0000 mg | INTRAVENOUS | Status: DC | PRN
  Filled 2021-05-11: qty 2

## 2021-05-11 MED ORDER — SODIUM CHLORIDE 0.9 % IV SOLN
0.5000 mg/h | INTRAVENOUS | Status: DC
  Filled 2021-05-11: qty 5

## 2021-05-11 MED ORDER — LORAZEPAM 2 MG/ML IJ SOLN
0.5000 mg | INTRAMUSCULAR | Status: DC | PRN
  Administered 2021-05-11: 0.5 mg via INTRAVENOUS
  Filled 2021-05-11: qty 1

## 2021-05-11 MED ORDER — VANCOMYCIN HCL 1250 MG/250ML IV SOLN: 1250.0000 mg | INTRAVENOUS | Status: DC

## 2021-05-11 MED ORDER — ACETAMINOPHEN 325 MG PO TABS: 650.0000 mg | ORAL_TABLET | Freq: Four times a day (QID) | ORAL | Status: DC | PRN

## 2021-05-11 MED ORDER — HYDROMORPHONE HCL 1 MG/ML IJ SOLN: 0.5000 mg | INTRAMUSCULAR | Status: DC | PRN

## 2021-05-11 MED ORDER — LORAZEPAM 1 MG PO TABS: 0.5000 mg | ORAL_TABLET | Freq: Once | ORAL | Status: DC

## 2021-05-11 MED ORDER — VANCOMYCIN HCL IN DEXTROSE 1-5 GM/200ML-% IV SOLN: 1000.0000 mg | INTRAVENOUS | Status: DC

## 2021-05-11 MED ORDER — GLYCOPYRROLATE 1 MG PO TABS
1.0000 mg | ORAL_TABLET | ORAL | Status: DC | PRN
  Filled 2021-05-11: qty 1

## 2021-05-11 MED ORDER — LACTATED RINGERS IV SOLN: INTRAVENOUS | Status: DC

## 2021-05-11 MED ORDER — LORAZEPAM 2 MG/ML IJ SOLN
0.5000 mg | Freq: Once | INTRAMUSCULAR | Status: AC
  Administered 2021-05-11: 0.5 mg via INTRAVENOUS

## 2021-05-11 MED ORDER — LORAZEPAM 2 MG/ML IJ SOLN
0.5000 mg | Freq: Once | INTRAMUSCULAR | Status: AC
  Administered 2021-05-11: 0.5 mg via INTRAVENOUS
  Filled 2021-05-11: qty 1

## 2021-05-11 MED ORDER — HALOPERIDOL LACTATE 5 MG/ML IJ SOLN: 2.0000 mg | Freq: Four times a day (QID) | INTRAMUSCULAR | Status: DC | PRN

## 2021-05-11 MED ORDER — ALBUTEROL SULFATE (2.5 MG/3ML) 0.083% IN NEBU
2.5000 mg | INHALATION_SOLUTION | RESPIRATORY_TRACT | Status: DC | PRN
  Administered 2021-05-11: 2.5 mg via RESPIRATORY_TRACT
  Filled 2021-05-11: qty 3

## 2021-05-11 MED ORDER — ONDANSETRON HCL 4 MG/2ML IJ SOLN: 4.0000 mg | Freq: Four times a day (QID) | INTRAMUSCULAR | Status: DC | PRN

## 2021-05-11 MED ORDER — POLYVINYL ALCOHOL 1.4 % OP SOLN
1.0000 [drp] | Freq: Four times a day (QID) | OPHTHALMIC | Status: DC | PRN
  Filled 2021-05-11: qty 15

## 2021-05-11 NOTE — Progress Notes (Signed)
[facility] ED [facility]) Hospital Liaison Note:   Aleasha Fregeau is a current hospice patient with a terminal diagnosis of  AML. Patient is being seen in ED due to Acute on chronic respiratory failure with hypoxia ([facility]). Patient is a DNR. If patient can not make her own decisions, point of contact is: Ines Bloomer [phone]).   [place]  MSW spoke with [name] and [name] is speaking with patients son to finalize how they would like to proceed with care. Family unsure of what additional interventions they would like to explore, but are now just wanting to 'see how things go.'  MSW provided above updates to PMT, Amber, NP, to touch base with family.  [facility] will continue to follow.    Thank you for the opportunity to participate in this patient's care.   Daphene Calamity, MSW [facility] Liaison  [phone]

## 2021-05-11 NOTE — Progress Notes (Signed)
°   05/11/21 1103  TOC ED Mini Assessment  TOC Time spent with patient (minutes): 20  TOC Time saved using PING (minutes): 20  PING Used in TOC Assessment Yes  What brought you to the Emergency Department?  shortness of breath  Key Contact 1 Ines Bloomer [phone])   Pt currently active with [facility] for Home Hospice services as confirmed by [facility] with Lorayne Bender of [facility].

## 2021-05-11 NOTE — ED Notes (Signed)
Pt placed on hospital bed for comfort, and turned to left side for comfort.

## 2021-05-11 NOTE — Progress Notes (Signed)
Brief Palliative Medicine Progress Note:  PMT consult received and chart reviewed.   Noted [name] is a current hospice patient with [facility] ([facility]). I spoke with [facility] liaison, Lorayne Bender, to make them aware patient is in the ED. Lorayne Bender felt family would benefit from PMT assistance with Zinc as they are uncertain of next steps. GOC was completed with family and patient - full note to follow:  Recommendations/Plan Initiated full comfort measures Continue DNR/DNI as previously documented Patient may not be stable for transfer home/residential hospice once Bipap is removed - will reassess tomorrow 1/3 - may be hospital death Added orders for EOL symptom management and to reflect full comfort measures, as well as discontinued orders that were not focused on comfort Patient is currently doing well from Bipap to [place] transition - drip is likely not needed at this time. If patient declines/has increased dyspnea, recommend initiation of dilaudid drip Unrestricted visitation orders were placed per current [place] EOL visitation policy  Nursing to provide frequent assessments and administer PRN medications as clinically necessary to ensure EOL comfort PMT will continue to follow and support holistically  Thank you for allowing PMT to assist in the care of this patient.  Collins Kerby M. Tamala Julian [facility] Palliative Medicine Team Team Phone: [phone] NO CHARGE

## 2021-05-11 NOTE — H&P (Signed)
History and Physical    AVAGAIL WHITTLESEY CBJ:[id number] DOB: 27-Jan-1941 DOA: 05/10/2021  PCP: [name], MD   Patient coming from: Home   Chief Complaint: Poorly responsive   HPI: HANG AMMON is a pleasant 81 y.o. 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  Patient's son is at the bedside and assists with the history.  Patient has had increased cough, shortness of breath, and fevers at home for the past few days.  She had been on 2 to 3 L/min of supplemental oxygen chronically but recently needed to increase this due to the current illness.  She lives with her son who was at work when another relative found the patient to be poorly responsive and called EMS.  Patient was saturating 50% on 3 L/min of supplemental oxygen, placed on CPAP, given breathing treatment, and brought into the ED.  Patient's mentation improving in the ED on BiPAP and able to provide a little bit of additional history, denies any chest or abdominal pain, but complains of leg pain that she attributes to her restless leg syndrome.  Patient confirms that she would not want any heroic measures but would like to continue BiPAP and antibiotics, and hopes that she may improve from this acute illness.  ED Course: Upon arrival to the ED, patient is found to be obtunded initially, saturating mid 90s on CPAP, tachypneic, tachycardic, and with blood pressure in the low 100s initially.  EKG features sinus tachycardia with rate 136 and chest x-ray concerning for multifocal pneumonia.  Chemistry panel notable for glucose 493, creatinine 1.85, and potassium 5.3.  CBC with ANC 480, ALC 500, hemoglobin 7.4, and platelets 61,000.  Lactic acid was 8.3.  Patient was given 2 L of LR, Zosyn, vancomycin, duo nebs, and  placed on BiPAP in the ED.  Review of Systems:  All other systems reviewed and apart from HPI, are negative.  Past Medical History:  Diagnosis Date   Anemia    hx (04/14/2016)   Anxiety    Arthritis    "severe in my back; hands; ankles" (04/14/2016)   Atypical chest pain 01/24/2018   Midline pain absent supine "constant" daytime since 12/31/17 > resolved as of 02/20/2018 on gerd/ gas diet    Basal cell carcinoma    "several burned off; one cut off"   CAD in native artery    a. NSTEMI 04/2016 - s/p DES to LAD and LCx. PCI to LCx notable for microembolization during cath.   Chest pain- reslved with stopping Brilinta now on Plaix 04/16/2016   Chronic lower back pain    CKD (chronic kidney disease), stage III (HCC)    stage 3   Complication of anesthesia    COPD (chronic obstructive pulmonary disease) (HCC)    Depression    Diverticulosis    DJD (degenerative joint disease)    Family history of adverse reaction to anesthesia    Gallstones    GERD (gastroesophageal reflux disease)    History of gout    History of kidney stones    Hyperlipidemia    Hypertension    IBS (irritable bowel syndrome)    Ischemic cardiomyopathy    a. 04/2016: EF 40-50% by cath, 50-55% +WMA by echo.   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 ([place]) ~ 2015   Morbid  obesity ([place])    NSTEMI (non-ST elevated myocardial infarction) ([place]) 04/14/2016   Obstructive sleep apnea of adult    appt for cpap 8-8 to adjust settings no cpap at this time   On supplemental oxygen therapy    uses at bedtime or exposed to heat, 2Liters   Peripheral neuropathy    feet and toes   Peripheral vascular disease (HCC)    RLS (restless legs syndrome)    Spinal stenosis    Type II diabetes mellitus ([place])    Vitamin D deficiency    Walking pneumonia    Wears partial dentures    lower    Past Surgical History:  Procedure Laterality Date   APPENDECTOMY     BASAL CELL CARCINOMA EXCISION Left    leg   BIOPSY  12/25/2019    Procedure: BIOPSY;  Surgeon: Lavena Bullion, DO;  Location: [facility];  Service: Gastroenterology;;  EGD and COLON   CARDIAC CATHETERIZATION N/A 04/15/2016   Procedure: Left Heart Cath and Coronary Angiography;  Surgeon: Belva Crome, MD;  Location: [facility];  Service: Cardiovascular;  Laterality: N/A;   CARDIAC CATHETERIZATION N/A 04/15/2016   Procedure: Coronary Stent Intervention;  Surgeon: Belva Crome, MD;  Location: [facility];  Service: Cardiovascular;  Laterality: N/A;  Mid LAD Mid CFX   CARPAL TUNNEL RELEASE Right    with trigger finger release   CATARACT EXTRACTION, BILATERAL Bilateral    COLONOSCOPY WITH PROPOFOL N/A 12/25/2019   Procedure: COLONOSCOPY WITH PROPOFOL;  Surgeon: Lavena Bullion, DO;  Location: [facility];  Service: Gastroenterology;  Laterality: N/A;   DEBRIDEMENT TENNIS ELBOW Right    ESOPHAGOGASTRODUODENOSCOPY (EGD) WITH PROPOFOL N/A 12/25/2019   Procedure: ESOPHAGOGASTRODUODENOSCOPY (EGD) WITH PROPOFOL;  Surgeon: Lavena Bullion, DO;  Location: [facility];  Service: Gastroenterology;  Laterality: N/A;   LUMBAR DISC SURGERY  X 2   MELANOMA EXCISION Left    "towards the back of my neck"   POLYPECTOMY  12/25/2019   Procedure: POLYPECTOMY;  Surgeon: Lavena Bullion, DO;  Location: [facility];  Service: Gastroenterology;;   SHOULDER ARTHROSCOPY W/ ROTATOR CUFF REPAIR Left    SHOULDER OPEN ROTATOR CUFF REPAIR Right    TOTAL KNEE ARTHROPLASTY Bilateral     Social History:   reports that she quit smoking about 37 years ago. Her smoking use included cigarettes. She has a 62.50 pack-year smoking history. She has never used smokeless tobacco. She reports that she does not drink alcohol and does not use drugs.  Allergies  Allergen Reactions   Brilinta [Ticagrelor] Shortness Of Breath and Other (See Comments)    Chest pain (also)   Aspirin Other (See Comments)    Can tolerate in small doses (is already on a blood thinner AND has  kidney disease)   Nsaids Other (See Comments)    Patient is taking a blood thinner and has kidney disease   Minocycline Hcl Other (See Comments)    Welts   Oruvail [Ketoprofen] Other (See Comments)    Has kidney disease and is taking a blood thinner   Tricor [Fenofibrate] Other (See Comments)    Reaction unknown   Vasotec [Enalaprilat] Other (See Comments)    Reaction unknown   Zinc Other (See Comments)    Reaction unknown    Family History  Problem Relation Age of Onset   Heart disease Mother    Heart attack Mother    Kidney disease Father    AAA (abdominal aortic aneurysm) Father  Parkinson's disease Father    Cancer Brother        type unknown   Heart disease Son    Cancer Sister        female    Alzheimer's disease Sister    Colon cancer Neg Hx    Colon polyps Neg Hx    Esophageal cancer Neg Hx    Pancreatic cancer Neg Hx    Stomach cancer Neg Hx    Liver disease Neg Hx    Diabetes Neg Hx      Prior to Admission medications   Medication Sig Start Date End Date Taking? Authorizing Provider  acetaminophen (TYLENOL) 500 MG tablet Take 1,000 mg by mouth every 6 (six) hours as needed for headache (pain).    [provider]  albuterol (VENTOLIN HFA) 108 (90 Base) MCG/ACT inhaler Inhale 2 puffs into the lungs every 4 (four) hours as needed for wheezing or shortness of breath. 09/07/19   Vladimir Crofts, PA-C  Ascorbic Acid (VITAMIN C) 1000 MG tablet Take 1,000 mg by mouth 2 (two) times daily.    [provider]  BD PEN NEEDLE NANO 2ND GEN 32G X 4 MM MISC USE AS DIRECTED 4 TIMES A DAY Patient taking differently: 1 each by Other route in the morning, at noon, in the evening, and at bedtime. 06/06/20   [name], MD  benzonatate (TESSALON) 100 MG capsule Take 1 perle  3 x /day  as needed to Prevent Cough  /  Patient knows to take by mouth 12/20/20   [name], MD  Cholecalciferol 125 MCG (5000 UT) capsule Take 5,000 Units by mouth 2 (two) times  daily.     [provider]  Continuous Blood Gluc Receiver (FREESTYLE LIBRE 2 READER) DEVI Use to test blood sugar multiple times a day. 10/16/20   Liane Comber, NP  Continuous Blood Gluc Sensor (FREESTYLE LIBRE 2 SENSOR) MISC Use to check blood sugar multiple times a day 10/16/20   Liane Comber, NP  diclofenac sodium (VOLTAREN) 1 % GEL Apply 4 g topically 4 (four) times daily. Patient taking differently: Apply 4 g topically 2 (two) times daily as needed (pain). 01/18/18   Vladimir Crofts, PA-C  escitalopram (LEXAPRO) 20 MG tablet Take 1 tablet (20 mg total) by mouth daily. 01/08/20 01/07/21  Garnet Sierras, NP  ferrous sulfate 325 (65 FE) MG tablet Take 325 mg by mouth 2 (two) times daily with a meal.    [provider]  furosemide (LASIX) 40 MG tablet Take 40 mg by mouth daily as needed for fluid or edema.    [provider]  gabapentin (NEURONTIN) 300 MG capsule Take  1 capsule   3  x /day  for Painful Diabetic Neuropathy 11/04/20   [name], MD  hydrOXYzine Pamoate (VISTARIL PO) Take 1 tablet by mouth 4 (four) times daily as needed. ? 25 mg, unsure    [provider]  Insulin Glargine (BASAGLAR KWIKPEN) 100 UNIT/ML INJECT 70 UNITS DAILY FOR DIABETES 04/29/21   Magda Bernheim, NP  ipratropium (ATROVENT) 0.06 % nasal spray Use 1 to 2 sprays each nostril 2 to 3 x /day as needed 11/04/20   [name], MD  ipratropium-albuterol (DUONEB) 0.5-2.5 (3) MG/3ML SOLN Take 3 mLs by nebulization 3 (three) times daily. 07/31/20   Sheikh, Omair Latif, DO  ketoconazole (NIZORAL) 2 % cream APPLY TO AFFECTED AREA TWICE A DAY Patient taking differently: Apply 1 application topically 2 (two) times daily. For facial  itching 11/16/19   [name], MD  Lancets [facility] ULTRASOFT) lancets Use as instructed 04/27/16   Philemon Kingdom, MD  metolazone (ZAROXOLYN) 10 MG tablet Take 1/2 to 1 tablet  Daily for Edema / Swelling of Legs 11/04/20   [name], MD   metoprolol succinate (TOPROL-XL) 25 MG 24 hr tablet Take 1 tablet (25 mg total) by mouth at bedtime. 07/25/19   Duke, Tami Lin, PA  nitroGLYCERIN (NITROSTAT) 0.4 MG SL tablet Place 1 tablet (0.4 mg total) under the tongue every 5 (five) minutes x 3 doses as needed for chest pain. 07/31/20   Sheikh, Omair Latif, DO  NOVOLOG FLEXPEN 100 UNIT/ML FlexPen INJECT 20 UNITS UNDER SKIN THREE TIMES DAILY BEFORE MEALS, IF CBG >130 INCREASE TO 25 UNITS 01/07/21   [name], MD  nystatin (MYCOSTATIN) 100000 UNIT/ML suspension 5 ml four times a day, retain in mouth as long as possible (Swish and Swallow).  Use for 48 hours after symptoms resolve. Patient taking differently: Use as directed 5 mLs in the mouth or throat See admin instructions. Take 5 ml by mouth four times a day, retain in mouth as long as possible (Swish and Swallow).  Use for 48 hours after symptoms resolve. 07/23/20   Garnet Sierras, NP  ondansetron (ZOFRAN) 4 MG tablet Take 1 tablet (4 mg total) by mouth every 6 (six) hours as needed for nausea. 07/31/20   Sheikh, Omair Latif, DO  ONETOUCH VERIO test strip USE AS INSTRUCTED TO CHECK SUGAR 2 TIMES DAILY. Patient taking differently: 1 each by Other route in the morning and at bedtime. 09/21/17   Philemon Kingdom, MD  OXYGEN Inhale 2 L into the lungs continuous.    [provider]  pantoprazole (PROTONIX) 40 MG tablet Take 1 tablet (40 mg total) by mouth 2 (two) times daily as needed. For acid reflux. 06/12/20 06/12/21  Liane Comber, NP  Polyethylene Glycol 400 (BLINK TEARS OP) Place 1 drop into both eyes daily as needed (dry eyes).     [provider]  rOPINIRole (REQUIP) 3 MG tablet TAKE 1 TABLET 4 TIMES A DAY FOR RESTLESS LEGS 04/13/21   Liane Comber, NP  tiZANidine (ZANAFLEX) 4 MG tablet Take 2 mg by mouth at bedtime as needed for muscle spasms. 07/25/20   [provider]  traMADol Veatrice Bourbon) 50 MG tablet Take 1 tablet every 4 hours to prevent severe cough  12/26/20   [name], MD  triamcinolone (NASACORT) 55 MCG/ACT AERO nasal inhaler Place 2 sprays into the nose at bedtime. Patient taking differently: Place 2 sprays into the nose 2 (two) times daily. 04/16/20 04/16/21  Garnet Sierras, NP  triamcinolone ointment (KENALOG) 0.1 % Apply 1 application topically 2 (two) times daily. 10/08/20   Liane Comber, NP  VICTOZA 18 MG/3ML SOPN INJECT 1.2 MG INTO THE SKIN DAILY AT 12 NOON. 04/29/21   Magda Bernheim, NP    Physical Exam: Vitals:   05/10/21 2345 05/11/21 0000 05/11/21 0015 05/11/21 0030  BP: (!) 92/46 (!) 91/45 (!) 100/39 97/72  Pulse: 96 95 97 94  Resp: (!) 36 (!) 33 (!) 23 (!) 28  Temp:      TempSrc:      SpO2: 95% 95% 99% 93%  Weight:      Height:        Constitutional: calm, no diaphoresis, no cyanosis  Eyes: PERTLA, lids and conjunctivae normal ENMT: Mucous membranes are moist. Posterior pharynx clear of any exudate or lesions.   Neck: supple, no masses  Respiratory: Tachypneic. Labored respirations. No wheezing.   Cardiovascular: S1 & S2 heard, regular rate and rhythm. Trace lower leg edema b/l. Abdomen: No distension, no tenderness, soft. Bowel sounds active.  Musculoskeletal: no clubbing / cyanosis. No joint deformity upper and lower extremities.   Skin: no significant rashes, lesions, ulcers. Warm, dry, well-perfused. Neurologic: CN 2-12 grossly intact. Moving all extremities. Somnolent, wakes to voice and answers basic questions.   Psychiatric: Calm. Cooperative.    Labs and Imaging on Admission: I have personally reviewed following labs and imaging studies  CBC: Recent Labs  Lab 05/10/21 2141 05/10/21 2223  WBC  --  1.0*  NEUTROABS  --  0.5*  HGB 16.7*   17.7* 7.4*  HCT 49.0*   52.0* 23.4*  MCV  --  109.3*  PLT  --  61*   Basic Metabolic Panel: Recent Labs  Lab 05/10/21 2121 05/10/21 2141  NA 134* 131*   132*  K 5.3* 5.1   5.2*  CL 91* 93*  CO2 25  --   GLUCOSE 493* 491*  BUN 23 29*  CREATININE  1.85* 1.60*  CALCIUM 9.1  --    GFR: Estimated Creatinine Clearance: 32.8 mL/min (A) (by C-G formula based on SCr of 1.6 mg/dL (H)). Liver Function Tests: Recent Labs  Lab 05/10/21 2121  AST 38  ALT 18  ALKPHOS 68  BILITOT 1.6*  PROT 6.8  ALBUMIN 3.1*   No results for input(s): LIPASE, AMYLASE in the last 168 hours. No results for input(s): AMMONIA in the last 168 hours. Coagulation Profile: Recent Labs  Lab 05/10/21 2156  INR 1.2   Cardiac Enzymes: No results for input(s): CKTOTAL, CKMB, CKMBINDEX, TROPONINI in the last 168 hours. BNP (last 3 results) No results for input(s): PROBNP in the last 8760 hours. HbA1C: No results for input(s): HGBA1C in the last 72 hours. CBG: Recent Labs  Lab 05/10/21 2116  GLUCAP 458*   Lipid Profile: No results for input(s): CHOL, HDL, LDLCALC, TRIG, CHOLHDL, LDLDIRECT in the last 72 hours. Thyroid Function Tests: No results for input(s): TSH, T4TOTAL, FREET4, T3FREE, THYROIDAB in the last 72 hours. Anemia Panel: No results for input(s): VITAMINB12, FOLATE, FERRITIN, TIBC, IRON, RETICCTPCT in the last 72 hours. Urine analysis:    Component Value Date/Time   COLORURINE YELLOW 05/10/2021 2124   APPEARANCEUR HAZY (A) 05/10/2021 2124   LABSPEC 1.018 05/10/2021 2124   PHURINE 5.0 05/10/2021 2124   GLUCOSEU >=500 (A) 05/10/2021 2124   HGBUR NEGATIVE 05/10/2021 2124   BILIRUBINUR NEGATIVE 05/10/2021 2124   KETONESUR NEGATIVE 05/10/2021 2124   PROTEINUR >=300 (A) 05/10/2021 2124   UROBILINOGEN 0.2 08/27/2013 1427   NITRITE NEGATIVE 05/10/2021 2124   LEUKOCYTESUR NEGATIVE 05/10/2021 2124   Sepsis Labs: @LABRCNTIP (procalcitonin:4,lacticidven:4) ) Recent Results (from the past 240 hour(s))  Resp Panel by RT-PCR (Flu A&B, Covid) Nasopharyngeal Swab     Status: None   Collection Time: 05/10/21  9:15 PM   Specimen: Nasopharyngeal Swab; Nasopharyngeal(NP) swabs in vial transport medium  Result Value Ref Range Status   SARS Coronavirus  2 by RT PCR NEGATIVE NEGATIVE Final    Comment: (NOTE) SARS-CoV-2 target nucleic acids are NOT DETECTED.  The SARS-CoV-2 RNA is generally detectable in upper respiratory specimens during the acute phase of infection. The lowest concentration of SARS-CoV-2 viral copies this assay can detect is 138 copies/mL. A negative result does not preclude SARS-Cov-2 infection and should not be used as the sole basis for treatment or other patient management decisions. A negative  result may occur with  improper specimen collection/handling, submission of specimen other than nasopharyngeal swab, presence of viral mutation(s) within the areas targeted by this assay, and inadequate number of viral copies(<138 copies/mL). A negative result must be combined with clinical observations, patient history, and epidemiological information. The expected result is Negative.  Fact Sheet for Patients:  EntrepreneurPulse.com.au  Fact Sheet for Healthcare Providers:  IncredibleEmployment.be  This test is no t yet approved or cleared by the [place] FDA and  has been authorized for detection and/or diagnosis of SARS-CoV-2 by FDA under an Emergency Use Authorization (EUA). This EUA will remain  in effect (meaning this test can be used) for the duration of the COVID-19 declaration under Section 564(b)(1) of the Act, 21 U.S.C.section 360bbb-3(b)(1), unless the authorization is terminated  or revoked sooner.       Influenza A by PCR NEGATIVE NEGATIVE Final   Influenza B by PCR NEGATIVE NEGATIVE Final    Comment: (NOTE) The Xpert Xpress SARS-CoV-2/FLU/RSV plus assay is intended as an aid in the diagnosis of influenza from Nasopharyngeal swab specimens and should not be used as a sole basis for treatment. Nasal washings and aspirates are unacceptable for Xpert Xpress SARS-CoV-2/FLU/RSV testing.  Fact Sheet for Patients: EntrepreneurPulse.com.au  Fact  Sheet for Healthcare Providers: IncredibleEmployment.be  This test is not yet approved or cleared by the [place] FDA and has been authorized for detection and/or diagnosis of SARS-CoV-2 by FDA under an Emergency Use Authorization (EUA). This EUA will remain in effect (meaning this test can be used) for the duration of the COVID-19 declaration under Section 564(b)(1) of the Act, 21 U.S.C. section 360bbb-3(b)(1), unless the authorization is terminated or revoked.  Performed at [facility], [place] [address] [place], [place] [address]      Radiological Exams on Admission: DG Chest Portable 1 View  Result Date: 05/10/2021 CLINICAL DATA:  Shortness of breath EXAM: PORTABLE CHEST 1 VIEW COMPARISON:  Chest x-ray 07/26/2020, CT chest 07/27/2020, chest x-ray 12/26/2019 FINDINGS: The heart and mediastinal contours are unchanged. Aortic calcification Patchy airspace opacities within bilateral mid to lower lung zone. No pulmonary edema. Likely trace pleural effusions. No pneumothorax. No acute osseous abnormality. IMPRESSION: 1. Findings suggestive of multifocal pneumonia. Likely trace pleural effusions. Followup PA and lateral chest X-ray is recommended in 3-4 weeks following therapy to ensure resolution and exclude underlying malignancy. 2.  Aortic Atherosclerosis (ICD10-I70.0). Electronically Signed   By: Iven Finn M.D.   On: 05/10/2021 21:29    EKG: Independently reviewed. Sinus tachycardia, rate 136, incomplete RBBB.   Assessment/Plan   1. Septic shock d/t multifocal pneumonia; acute on chronic hypoxic respiratory failure   - Presents poorly responsive with saturation 50% on her usual O2 after a few days of fevers, cough, and increased SOB at home  - CXR concerning for multifocal PNA  - Cultures were collected, fluid bolus given, and broad-spectrum antibiotics and BiPAP started in ED  - Continue antibiotics and BiPAP, trend lactate and procalcitonin, follow  cultures and clinical course    2. AML; pancytopenia  - Patient is not interested in treatment for this aside from blood transfusion if needed for comfort  - ANC 480, ALC 500, Hgb 7.4, and platelets 61k on admission  - Neutropenic precautions, monitor and transfuse if needed    3. COPD, OSA  - Not wheezing on admission, currently on BiPAP, continue breathing treatments   4. Insulin-dependent DM  - A1c was 9/6% in Dec 2022  - Serum  glucose 498 in ED without DKA  - Continue CBG checks and insulin    5. CKD IIIb  - SCr is 1.85 on admission, up from 1.54 a month ago  - She was fluid-resuscitated in ED  - Renally-dose medications, hold diuretics, repeat chem panel in am    6. Chronic diastolic CHF  - Appears compensated  - EF was preserved on TTE in Aug 2021  - Given IVF boluses in ED for hypotension and lactate of 8.3 - Hold diuretics until BP improves, monitor volume status    DVT prophylaxis: SCDs  Code Status: DNR/DNI, confirmed with patient in ED  Level of Care: Level of care: Progressive Family Communication: Son at bedside  Disposition Plan:  Patient is from: Home  Anticipated d/c is to: TBD Anticipated d/c date is: 05/14/21  Patient currently: pending improved respiratory status  Consults called: none  Admission status: Inpatient     Vianne Bulls, MD [facility]  05/11/2021, 12:57 AM

## 2021-05-11 NOTE — Progress Notes (Addendum)
Progress Note    Janet Choi  GTX:[id number] DOB: 30-Jul-1940  DOA: 05/10/2021 PCP: [name], MD    Brief Narrative:     Medical records reviewed and are as summarized below:  Janet Choi is an 81 y.o. 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  Patient's son is at the bedside and assists with the history.  Patient has had increased cough, shortness of breath, and fevers at home for the past few days.  She had been on 2 to 3 L/min of supplemental oxygen chronically but recently needed to increase this due to the current illness.  She lives with her son who was at work when another relative found the patient to be poorly responsive and called EMS.   ADDENDUM: plan to transition to comfort care tonight   Assessment/Plan:   Principal Problem:   Acute on chronic respiratory failure with hypoxia (HCC) Active Problems:   OSA and COPD overlap syndrome (HCC)   CAD S/P percutaneous coronary angioplasty   Insulin dependent type 2 diabetes mellitus (HCC)   Pancytopenia (HCC)   Stage 3b chronic kidney disease (CKD) ([place])   Acute myeloid leukemia ([place])   Multifocal pneumonia   Severe sepsis with septic shock ([place])   GOC -on hospice at home -poor overall prognosis -last I spoke to son/sister- wants to see how she does with IV abx  Septic shock d/t multifocal pneumonia; acute on chronic hypoxic respiratory failure   - Presents poorly responsive with saturation 50% on her usual O2 after a few days of fevers, cough, and increased SOB at home  - CXR concerning for multifocal PNA  - Cultures were collected, fluid bolus given, and broad-spectrum antibiotics and BiPAP started in ED  - Continue antibiotics and BiPAP, trend lactate and procalcitonin,  follow cultures and clinical course     AML; pancytopenia  - Patient is not interested in treatment for this aside from blood transfusion if needed for comfort  - ANC 480, ALC 500, Hgb 7.4, and platelets 61k on admission  - Neutropenic precautions, monitor and transfuse if needed     COPD, OSA  - Not wheezing on admission, currently on BiPAP, continue breathing treatments    Insulin-dependent DM  - A1c was 9/6% in Dec 2022  - Continue CBG checks and insulin     CKD IIIb  - SCr is 1.85 on admission, up from 1.54 a month ago  - She was fluid-resuscitated in ED  - Cr not improved yet   Chronic diastolic CHF  - Appears compensated  - EF was preserved on TTE in Aug 2021  - Given IVF boluses in ED for hypotension and lactate of 8.3 - Hold diuretics until BP improves, monitor volume status   obesity Body mass index is 47.45 kg/m.   Family Communication/Anticipated D/C date and plan/Code Status   DVT prophylaxis: Lovenox ordered. Code Status: DNR Family Communication: at bedside and called son Disposition Plan: Status is: Inpatient  Remains inpatient appropriate because: needs to transition to comfort care         Medical Consultants:   Palliative care  Subjective:   C/o pain in legs  Objective:    Vitals:   05/11/21 1430 05/11/21 1500 05/11/21 1530 05/11/21 1600  BP: (!) 109/50 (!) 104/50 122/69 127/67  Pulse: 80 80 80 80  Resp:  (!) 21 20 (!) 23  Temp:      TempSrc:      SpO2: 97% 97% 96% 97%  Weight:      Height:        Intake/Output Summary (Last 24 hours) at 05/11/2021 1629 Last data filed at 05/11/2021 6579 Gross per 24 hour  Intake 4755.4 ml  Output 400 ml  Net 4355.4 ml   Filed Weights   05/10/21 2111  Weight: 113.9 kg    Exam:  General: Appearance:    Severely obese female in no acute distress     Lungs:     On bipap, diminished  Heart:    Normal heart rate.    MS:   All extremities are intact.    Neurologic:   Will awaken      Data Reviewed:   I have personally reviewed following labs and imaging studies:  Labs: Labs show the following:   Basic Metabolic Panel: Recent Labs  Lab 05/10/21 2121 05/10/21 2141 05/11/21 0232  NA 134* 131*   132* 133*  K 5.3* 5.1   5.2* 5.0  CL 91* 93* 92*  CO2 25  --  23  GLUCOSE 493* 491* 466*  BUN 23 29* 29*  CREATININE 1.85* 1.60* 1.95*  CALCIUM 9.1  --  8.3*   GFR Estimated Creatinine Clearance: 27 mL/min (A) (by C-G formula based on SCr of 1.95 mg/dL (H)). Liver Function Tests: Recent Labs  Lab 05/10/21 2121 05/11/21 0232  AST 38 259*  ALT 18 100*  ALKPHOS 68 65  BILITOT 1.6* 2.4*  PROT 6.8 5.6*  ALBUMIN 3.1* 2.5*   No results for input(s): LIPASE, AMYLASE in the last 168 hours. No results for input(s): AMMONIA in the last 168 hours. Coagulation profile Recent Labs  Lab 05/10/21 2156  INR 1.2    CBC: Recent Labs  Lab 05/10/21 2141 05/10/21 2223  WBC  --  1.0*  NEUTROABS  --  0.5*  HGB 16.7*   17.7* 7.4*  HCT 49.0*   52.0* 23.4*  MCV  --  109.3*  PLT  --  61*   Cardiac Enzymes: No results for input(s): CKTOTAL, CKMB, CKMBINDEX, TROPONINI in the last 168 hours. BNP (last 3 results) No results for input(s): PROBNP in the last 8760 hours. CBG: Recent Labs  Lab 05/10/21 2116 05/11/21 0158 05/11/21 0422 05/11/21 0736 05/11/21 1123  GLUCAP 458* 429* 431* 357* 297*   D-Dimer: No results for input(s): DDIMER in the last 72 hours. Hgb A1c: No results for input(s): HGBA1C in the last 72 hours. Lipid Profile: No results for input(s): CHOL, HDL, LDLCALC, TRIG, CHOLHDL, LDLDIRECT in the last 72 hours. Thyroid function studies: No results for input(s): TSH, T4TOTAL, T3FREE, THYROIDAB in the last 72 hours.  Invalid input(s): FREET3 Anemia work up: No results for input(s): VITAMINB12, FOLATE, FERRITIN, TIBC, IRON, RETICCTPCT in the last 72 hours. Sepsis Labs: Recent Labs  Lab 05/10/21 2121 05/10/21 2223 05/11/21 0127 05/11/21 0232  05/11/21 0513  PROCALCITON  --   --   --  13.66  --   WBC  --  1.0*  --   --   --   LATICACIDVEN 8.3*  --  6.5*  --  5.0*    Microbiology Recent Results (from the past 240 hour(s))  Resp Panel by RT-PCR (Flu A&B, Covid) Nasopharyngeal Swab     Status: None   Collection Time: 05/10/21  9:15 PM   Specimen: Nasopharyngeal  Swab; Nasopharyngeal(NP) swabs in vial transport medium  Result Value Ref Range Status   SARS Coronavirus 2 by RT PCR NEGATIVE NEGATIVE Final    Comment: (NOTE) SARS-CoV-2 target nucleic acids are NOT DETECTED.  The SARS-CoV-2 RNA is generally detectable in upper respiratory specimens during the acute phase of infection. The lowest concentration of SARS-CoV-2 viral copies this assay can detect is 138 copies/mL. A negative result does not preclude SARS-Cov-2 infection and should not be used as the sole basis for treatment or other patient management decisions. A negative result may occur with  improper specimen collection/handling, submission of specimen other than nasopharyngeal swab, presence of viral mutation(s) within the areas targeted by this assay, and inadequate number of viral copies(<138 copies/mL). A negative result must be combined with clinical observations, patient history, and epidemiological information. The expected result is Negative.  Fact Sheet for Patients:  EntrepreneurPulse.com.au  Fact Sheet for Healthcare Providers:  IncredibleEmployment.be  This test is no t yet approved or cleared by the [place] FDA and  has been authorized for detection and/or diagnosis of SARS-CoV-2 by FDA under an Emergency Use Authorization (EUA). This EUA will remain  in effect (meaning this test can be used) for the duration of the COVID-19 declaration under Section 564(b)(1) of the Act, 21 U.S.C.section 360bbb-3(b)(1), unless the authorization is terminated  or revoked sooner.       Influenza A by PCR NEGATIVE  NEGATIVE Final   Influenza B by PCR NEGATIVE NEGATIVE Final    Comment: (NOTE) The Xpert Xpress SARS-CoV-2/FLU/RSV plus assay is intended as an aid in the diagnosis of influenza from Nasopharyngeal swab specimens and should not be used as a sole basis for treatment. Nasal washings and aspirates are unacceptable for Xpert Xpress SARS-CoV-2/FLU/RSV testing.  Fact Sheet for Patients: EntrepreneurPulse.com.au  Fact Sheet for Healthcare Providers: IncredibleEmployment.be  This test is not yet approved or cleared by the [place] FDA and has been authorized for detection and/or diagnosis of SARS-CoV-2 by FDA under an Emergency Use Authorization (EUA). This EUA will remain in effect (meaning this test can be used) for the duration of the COVID-19 declaration under Section 564(b)(1) of the Act, 21 U.S.C. section 360bbb-3(b)(1), unless the authorization is terminated or revoked.  Performed at [facility], [place] [address], [place], [place] [address]   Blood Culture (routine x 2)     Status: None (Preliminary result)   Collection Time: 05/10/21  9:19 PM   Specimen: BLOOD LEFT FOREARM  Result Value Ref Range Status   Specimen Description BLOOD LEFT FOREARM  Final   Special Requests   Final    BOTTLES DRAWN AEROBIC AND ANAEROBIC Blood Culture adequate volume   Culture   Final    NO GROWTH < 12 HOURS Performed at [facility], [place] [address], [place], [place] [address]    Report Status PENDING  Incomplete  Blood Culture (routine x 2)     Status: None (Preliminary result)   Collection Time: 05/10/21  9:56 PM   Specimen: BLOOD RIGHT HAND  Result Value Ref Range Status   Specimen Description BLOOD RIGHT HAND  Final   Special Requests   Final    BOTTLES DRAWN AEROBIC AND ANAEROBIC Blood Culture adequate volume   Culture   Final    NO GROWTH < 12 HOURS Performed at [facility], [place] [address] [place],  [address]    Report  Status PENDING  Incomplete    Procedures and diagnostic studies:  DG Chest Portable 1 View  Result Date: 05/10/2021 CLINICAL DATA:  Shortness of breath EXAM: PORTABLE CHEST 1 VIEW COMPARISON:  Chest x-ray 07/26/2020, CT chest 07/27/2020, chest x-ray 12/26/2019 FINDINGS: The heart and mediastinal contours are unchanged. Aortic calcification Patchy airspace opacities within bilateral mid to lower lung zone. No pulmonary edema. Likely trace pleural effusions. No pneumothorax. No acute osseous abnormality. IMPRESSION: 1. Findings suggestive of multifocal pneumonia. Likely trace pleural effusions. Followup PA and lateral chest X-ray is recommended in 3-4 weeks following therapy to ensure resolution and exclude underlying malignancy. 2.  Aortic Atherosclerosis (ICD10-I70.0). Electronically Signed   By: Iven Finn M.D.   On: 05/10/2021 21:29    Medications:    insulin aspart  0-15 Units Subcutaneous Q4H   insulin glargine-yfgn  25 Units Subcutaneous QHS   ipratropium-albuterol  3 mL Nebulization TID   Continuous Infusions:  azithromycin Stopped (05/11/21 0252)   ceFEPime (MAXIPIME) IV Stopped (05/11/21 0554)   [START ON 05/12/2021] vancomycin       LOS: 1 day   Geradine Girt  [facility]   How to contact the [facility] Attending or Consulting provider [place] or covering provider during after hours [place], for this patient?  Check the care team in [facility] and look for a) attending/consulting [facility] provider listed and b) the [facility] team listed Log into [email or web address] and use [place] 's universal password to access. If you do not have the password, please contact the hospital operator. Locate the [facility] provider you are looking for under [facility] and page to a number that you can be directly reached. If you still have difficulty reaching the provider, please page the [facility] (Director on Call) for the Hospitalists listed on amion for assistance.  05/11/2021, 4:29 PM

## 2021-05-11 NOTE — ED Notes (Signed)
Pt had a very small bowel movement using bedpan.  Pt's son returned and RN gave update w/ pt's permission

## 2021-05-11 NOTE — ED Notes (Signed)
Pt and family requesting ativan for restless legs and comfort. MD made aware and new orders placed. RN explained to pt and son that this could drop pts BP more and family verbalized understanding, stating comfort was the priority.

## 2021-05-11 NOTE — H&P (Deleted)
[facility] ED [facility]) Hospital Liaison Note:  Janet Choi is a current hospice patient with a terminal diagnosis of  AML. Patient is being seen in ED due to Acute on chronic respiratory failure with hypoxia (ACC). Patient is a DNR. If patient can not make her own decisions, point of contact is: Ines Bloomer [phone]).   ACC will continue to follow.     Thank you for the opportunity to participate in this patient's care.   Daphene Calamity, MSW [facility] Liaison  [phone]

## 2021-05-11 NOTE — Progress Notes (Signed)
Notified bedside nurse of need to draw repeat lactic acid. Will continue to follow code sepsis.

## 2021-05-11 NOTE — ED Notes (Signed)
Pt and family very anxious. Pt requesting medicine for her restless legs.

## 2021-05-11 NOTE — ED Notes (Signed)
NP at bedside with verbal orders to not check CBG.

## 2021-05-11 NOTE — Progress Notes (Signed)
Pharmacy Antibiotic Note  Janet Choi is a 81 y.o. female admitted on 05/10/2021 with pneumonia.  Pharmacy has been consulted for Vancomycin dosing. Leukopenic. Mild renal dysfunction.   Plan: Vancomycin 1250 mg IV q48h >>>Estimated AUC: 499 Zosyn x 1 in the ED Trend WBC, temp, renal function  F/U infectious work-up Drug levels as indicated   Height: 5\' 1"  (154.9 cm) Weight: 113.9 kg (251 lb 1.7 oz) IBW/kg (Calculated) : 47.8  Temp (24hrs), Avg:98.8 F (37.1 C), Min:98.8 F (37.1 C), Max:98.8 F (37.1 C)  Recent Labs  Lab 05/10/21 2121 05/10/21 2141 05/10/21 2223  WBC  --   --  1.0*  CREATININE 1.85* 1.60*  --   LATICACIDVEN 8.3*  --   --     Estimated Creatinine Clearance: 32.8 mL/min (A) (by C-G formula based on SCr of 1.6 mg/dL (H)).    Allergies  Allergen Reactions   Brilinta [Ticagrelor] Shortness Of Breath and Other (See Comments)    Chest pain (also)   Aspirin Other (See Comments)    Can tolerate in small doses (is already on a blood thinner AND has kidney disease)   Nsaids Other (See Comments)    Patient is taking a blood thinner and has kidney disease   Minocycline Hcl Other (See Comments)    Welts   Oruvail [Ketoprofen] Other (See Comments)    Has kidney disease and is taking a blood thinner   Tricor [Fenofibrate] Other (See Comments)    Reaction unknown   Vasotec [Enalaprilat] Other (See Comments)    Reaction unknown   Zinc Other (See Comments)    Reaction unknown    Narda Bonds, PharmD, BCPS Clinical Pharmacist Phone: [phone]

## 2021-05-11 NOTE — ED Notes (Signed)
DNR band placed on pt.

## 2021-05-11 NOTE — Progress Notes (Signed)
Pharmacy Antibiotic Note  Janet Choi is a 80 y.o. female admitted on 05/10/2021 with pneumonia.  Pharmacy has been consulted for Vancomycin dosing. Pt with pancytopenia.  SCr up to 1.9  Plan: Change Vancomycin to 1000 mg IV q48h. Est AUC: 457, SCr 1.9, Vd coeff 0.5 Cefepime 2gm IV 12hr (MD dosing) - MD only did for 5 days (? Extend to 7 days) Trend WBC, temp, renal function  Drug levels as indicated   Height: 5\' 1"  (154.9 cm) Weight: 113.9 kg (251 lb 1.7 oz) IBW/kg (Calculated) : 47.8  Temp (24hrs), Avg:98.8 F (37.1 C), Min:98.8 F (37.1 C), Max:98.8 F (37.1 C)  Recent Labs  Lab 05/10/21 2121 05/10/21 2141 05/10/21 2223 05/11/21 0127 05/11/21 0232 05/11/21 0513  WBC  --   --  1.0*  --   --   --   CREATININE 1.85* 1.60*  --   --  1.95*  --   LATICACIDVEN 8.3*  --   --  6.5*  --  5.0*     Estimated Creatinine Clearance: 27 mL/min (A) (by C-G formula based on SCr of 1.95 mg/dL (H)).    Allergies  Allergen Reactions   Brilinta [Ticagrelor] Shortness Of Breath and Other (See Comments)    Chest pain (also)   Aspirin Other (See Comments)    Can tolerate in small doses (is already on a blood thinner AND has kidney disease)   Nsaids Other (See Comments)    Patient is taking a blood thinner and has kidney disease   Minocycline Hcl Other (See Comments)    Welts   Oruvail [Ketoprofen] Other (See Comments)    Has kidney disease and is taking a blood thinner   Tricor [Fenofibrate] Other (See Comments)    Reaction unknown   Vasotec [Enalaprilat] Other (See Comments)    Reaction unknown   Zinc Other (See Comments)    Reaction unknown    Antibiotics: 1/1 Zosyn x 1 1/1 Vanc >> 1/2 Cefepime >>1/6 1/2 Azith >>1/6  Micro data: 1/1 BCx: 1/1 UCx:  Sherlon Handing, PharmD, BCPS Please see amion for complete clinical pharmacist phone list 05/11/2021 8:30 AM

## 2021-05-11 NOTE — Consult Note (Signed)
Consultation Note Date: 05/11/2021   Patient Name: Janet Choi  DOB: 02/18/1941  MRN: [id number]  Age / Sex: 81 y.o., female  PCP: [name], MD Referring Physician: Geradine Girt, DO  Reason for Consultation: Establishing goals of care  HPI/Patient Profile: 81 y.o. 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 Patient was admitted on 05/10/2021 with septic shock secondary to multifocal pneumonia, acute on chronic respiratory failure, AML/pancytopenia.   ED Course: Upon arrival to the ED, patient is found to be obtunded initially, saturating mid 90s on CPAP, tachypneic, tachycardic, and with blood pressure in the low 100s initially.  EKG features sinus tachycardia with rate 136 and chest x-ray concerning for multifocal pneumonia.  Chemistry panel notable for glucose 493, creatinine 1.85, and potassium 5.3.  CBC with ANC 480, ALC 500, hemoglobin 7.4, and platelets 61,000.  Lactic acid was 8.3.  Patient was given 2 L of LR, Zosyn, vancomycin, duo nebs, and placed on BiPAP in the ED.  Clinical Assessment and Goals of Care: I have reviewed medical records including EPIC notes, labs, and imaging. Received report from primary RN - no acute concerns.   Went to visit patient at bedside - brother/Janet Choi present. Patient was lying in bed asleep - she did not wake to voice/gentle touch - had recently been given medication for anxiety. No signs or non-verbal gestures of pain or discomfort noted. No respiratory distress, increased work of breathing, or secretions noted. Bipap in place.  Met with Barbaraann Rondo in ED conference room and HCPOA/sister/Janet Choi via speakerphone to discuss diagnosis,  prognosis, GOC, EOL wishes, disposition, and options. We attempted to include patient's son/Wade, but he did not answer phone.  I introduced Palliative Medicine as specialized medical care for people living with serious illness. It focuses on providing relief from the symptoms and stress of a serious illness. The goal is to improve quality of life for both the patient and the family.  We discussed a brief life review of the patient as well as functional and nutritional status. Patient has one son - Janet Choi. Janet Choi is her HCPOA but she tries to include Janet Choi in decision making. Prior to hospitalization, patient was living alone in a private residence. Janet Choi lives in a building in her backyard. Between Vicente Males, and other family members - patient is checked on daily. Janet Choi notified a decline in patient's appetite about 3-4 weeks ago. Patient was diagnosed with AML in January 2022 - at that time she was told her prognosis was 3-6 months. Patient is currently enrolled in hospice with [facility]. When Janet Choi found patient in respiratory distress, she called hospice who encouraged her to bring patient to ED for symptom management. Family describe patient at "spunky."  Reviewed medical updates and medical interventions given since her admission. We discussed patient's current illness  and what it means in the larger context of patient's on-going co-morbidities.   Natural disease trajectory for AML and expectations at EOL were discussed. I attempted to elicit values and goals of care important to the patient. The difference between aggressive medical intervention and comfort care was considered in light of the patient's goals of care.   We talked about transition to comfort measures in house and what that would entail inclusive of medications to control pain, dyspnea, agitation, nausea, and itching. We discussed stopping all unnecessary measures such as blood draws, needle sticks, oxygen, antibiotics, CBGs/insulin, cardiac  monitoring, IVF, and frequent vital signs.  Family are understandably tearful. They are hopeful patient might be able to make decision for comfort herself and request I try and speak with her.   Met Janet Choi's wife in ED waiting room. We went back to patient's room - she did wake to voice/gentle touch. When asked if she would want her life prolonged with current interventions vs transition to full comfort, she is clear her goal is to focus on comfort. She states, "I don't want to live this way." She indicates several times she does not want to wear Bipap any longer. She understands by removing bipap and stopping antibiotics, her life may be shortened. RN came into room to check blood sugar - patient did not feel the need to check.   Patient is clear with her family that there are other people she would like to see prior to removing bipap. Patient and family understand that she may decline quickly once removed. While trying to communicate, patient does sat at times during my visit.  Plan for family/friends to visit tonight, and when patient's ready can remove bipap and transition to [place] per her request.   Visit consisted of discussions dealing with the complex and emotionally intense issues of symptom management and palliative care in the setting of serious and life-threatening illness.   Discussed with patient/family the importance of continued conversation with each other and the medical providers regarding overall plan of care and treatment options, ensuring decisions are within the context of the patients values and GOCs.    Questions and concerns were addressed. The patient/family was encouraged to call with questions and/or concerns. PMT card was provided.  After my visit patient was transitioned from Bipap to [place] prior to starting dilaudid drip - per RN, patient is tolerating transition well without drip - will hold off on this for now.  Primary Decision Maker: PATIENT  HCPOA - sister/Janet Choi  [place]   Recommendations/Plan Initiated full comfort measures Continue DNR/DNI as previously documented Patient may not be stable for transfer home/residential hospice once Bipap is removed - will reassess tomorrow 1/3 - may be hospital death Added orders for EOL symptom management and to reflect full comfort measures, as well as discontinued orders that were not focused on comfort Patient is currently doing well from Bipap to [place] transition - drip is likely not needed at this time. If patient declines/has increased dyspnea, recommend initiation of dilaudid drip Unrestricted visitation orders were placed per current [facility] EOL visitation policy  Nursing to provide frequent assessments and administer PRN medications as clinically necessary to ensure EOL comfort PMT will continue to follow and support holistically  Code Status/Advance Care Planning: DNR  Palliative Prophylaxis:  Aspiration, Bowel Regimen, Delirium Protocol, Frequent Pain Assessment, Oral Care, and Turn Reposition  Additional Recommendations (Limitations, Scope, Preferences): Full Comfort Care  Psycho-social/Spiritual:  Created space and opportunity for patient and family  to express thoughts and feelings regarding patient's current medical situation.  Emotional support and therapeutic listening provided.  Prognosis:  Hours - Days  Discharge Planning: anticipated hospital death vs home or residential hospice      Primary Diagnoses: Present on Admission:  Acute on chronic respiratory failure with hypoxia (HCC)  Multifocal pneumonia  Acute myeloid leukemia (HCC)  Stage 3b chronic kidney disease (CKD) (HCC)  OSA and COPD overlap syndrome (HCC)  Pancytopenia (HCC)  Severe sepsis with septic shock (HCC)   I have reviewed the medical record, interviewed the patient and family, and examined the patient. The following aspects are pertinent.  Past Medical History:  Diagnosis Date   Anemia    hx  (04/14/2016)   Anxiety    Arthritis    "severe in my back; hands; ankles" (04/14/2016)   Atypical chest pain 01/24/2018   Midline pain absent supine "constant" daytime since 12/31/17 > resolved as of 02/20/2018 on gerd/ gas diet    Basal cell carcinoma    "several burned off; one cut off"   CAD in native artery    a. NSTEMI 04/2016 - s/p DES to LAD and LCx. PCI to LCx notable for microembolization during cath.   Chest pain- reslved with stopping Brilinta now on Plaix 04/16/2016   Chronic lower back pain    CKD (chronic kidney disease), stage III (HCC)    stage 3   Complication of anesthesia    COPD (chronic obstructive pulmonary disease) (HCC)    Depression    Diverticulosis    DJD (degenerative joint disease)    Family history of adverse reaction to anesthesia    Gallstones    GERD (gastroesophageal reflux disease)    History of gout    History of kidney stones    Hyperlipidemia    Hypertension    IBS (irritable bowel syndrome)    Ischemic cardiomyopathy    a. 04/2016: EF 40-50% by cath, 50-55% +WMA by echo.   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   Morbid obesity (HCC)    NSTEMI (non-ST elevated myocardial infarction) (HCC) 04/14/2016   Obstructive sleep apnea of adult    appt for cpap 8-8 to adjust settings no cpap at this time   On supplemental oxygen therapy    uses at bedtime or exposed to heat, 2Liters   Peripheral neuropathy    feet and toes   Peripheral vascular disease (HCC)    RLS (restless legs syndrome)    Spinal stenosis    Type II diabetes mellitus (HCC)    Vitamin D deficiency    Walking pneumonia    Wears partial dentures    lower   Social History   Socioeconomic History   Marital status: Widowed    Spouse name: Not on file   Number of children: 1   Years of education: Not on file   Highest education level: Not on file  Occupational History   Occupation: retired  Tobacco Use   Smoking status: Former    Packs/day: 2.50    Years:  25.00    Pack years: 62.50    Types: Cigarettes    Quit date: 11/11/1983    Years since quitting: 37.5   Smokeless tobacco: Never  Vaping Use   Vaping Use: Never used  Substance and Sexual Activity   Alcohol use: No    Alcohol/week: 0.0 standard drinks   Drug use: No   Sexual activity: Not Currently  Other Topics Concern  Not on file  Social History Narrative   Not on file   Social Determinants of Health   Financial Resource Strain: Not on file  Food Insecurity: Not on file  Transportation Needs: Not on file  Physical Activity: Not on file  Stress: Not on file  Social Connections: Not on file   Family History  Problem Relation Age of Onset   Heart disease Mother    Heart attack Mother    Kidney disease Father    AAA (abdominal aortic aneurysm) Father    Parkinson's disease Father    Cancer Brother        type unknown   Heart disease Son    Cancer Sister        female    Alzheimer's disease Sister    Colon cancer Neg Hx    Colon polyps Neg Hx    Esophageal cancer Neg Hx    Pancreatic cancer Neg Hx    Stomach cancer Neg Hx    Liver disease Neg Hx    Diabetes Neg Hx    Scheduled Meds:  insulin aspart  0-15 Units Subcutaneous Q4H   insulin glargine-yfgn  25 Units Subcutaneous QHS   ipratropium-albuterol  3 mL Nebulization TID   Continuous Infusions:  azithromycin Stopped (05/11/21 0252)   ceFEPime (MAXIPIME) IV Stopped (05/11/21 0554)   [START ON 05/12/2021] vancomycin     PRN Meds:.acetaminophen **OR** acetaminophen, albuterol, HYDROmorphone (DILAUDID) injection, LORazepam, ondansetron **OR** ondansetron (ZOFRAN) IV Medications Prior to Admission:  Prior to Admission medications   Medication Sig Start Date End Date Taking? Authorizing Provider  acetaminophen (TYLENOL) 500 MG tablet Take 1,000 mg by mouth every 6 (six) hours as needed for headache (pain).    [provider]  albuterol (VENTOLIN HFA) 108 (90 Base) MCG/ACT inhaler Inhale 2 puffs into the  lungs every 4 (four) hours as needed for wheezing or shortness of breath. 09/07/19   Vladimir Crofts, PA-C  Ascorbic Acid (VITAMIN C) 1000 MG tablet Take 1,000 mg by mouth 2 (two) times daily.    [provider]  BD PEN NEEDLE NANO 2ND GEN 32G X 4 MM MISC USE AS DIRECTED 4 TIMES A DAY Patient taking differently: 1 each by Other route in the morning, at noon, in the evening, and at bedtime. 06/06/20   [name], MD  benzonatate (TESSALON) 100 MG capsule Take 1 perle  3 x /day  as needed to Prevent Cough  /  Patient knows to take by mouth Patient taking differently: Take 100 mg by mouth See admin instructions. Take 1 perle  3 x /day  as needed to Prevent Cough  /  Patient knows to take by mouth 12/20/20   [name], MD  Cholecalciferol 125 MCG (5000 UT) capsule Take 5,000 Units by mouth 2 (two) times daily.     [provider]  Continuous Blood Gluc Receiver (FREESTYLE LIBRE 2 READER) DEVI Use to test blood sugar multiple times a day. Patient taking differently: 1 each by Other route See admin instructions. Use to test blood sugar multiple times a day. 10/16/20   Liane Comber, NP  Continuous Blood Gluc Sensor (FREESTYLE LIBRE 2 SENSOR) MISC Use to check blood sugar multiple times a day Patient taking differently: 1 each by Other route See admin instructions. Use to check blood sugar multiple times a day 10/16/20   Liane Comber, NP  diclofenac sodium (VOLTAREN) 1 % GEL Apply 4 g topically 4 (four) times daily. Patient taking differently: Apply  4 g topically 2 (two) times daily as needed (pain). 01/18/18   Vladimir Crofts, PA-C  escitalopram (LEXAPRO) 20 MG tablet Take 1 tablet (20 mg total) by mouth daily. 01/08/20 01/07/21  Garnet Sierras, NP  ferrous sulfate 325 (65 FE) MG tablet Take 325 mg by mouth 2 (two) times daily with a meal.    [provider]  furosemide (LASIX) 40 MG tablet Take 40 mg by mouth daily as needed for fluid or edema.    [provider]  gabapentin (NEURONTIN) 100 MG capsule Take 100-200 mg by mouth See admin instructions. 3-4 times daily as needed for pain 01/24/21   [provider]  gabapentin (NEURONTIN) 300 MG capsule Take  1 capsule   3  x /day  for Painful Diabetic Neuropathy 11/04/20   [name], MD  HYDROcodone-acetaminophen (NORCO/VICODIN) 5-325 MG tablet Take 1 tablet by mouth every 4 (four) hours as needed for moderate pain. 03/25/21   [provider]  hydrOXYzine (ATARAX) 10 MG tablet Take 10 mg by mouth daily as needed for anxiety. 05/04/21   [provider]  hydrOXYzine Pamoate (VISTARIL PO) Take 1 tablet by mouth 4 (four) times daily as needed. ? 25 mg, unsure    [provider]  Insulin Glargine (BASAGLAR KWIKPEN) 100 UNIT/ML INJECT 70 UNITS DAILY FOR DIABETES Patient taking differently: Inject 70 Units into the skin daily. 04/29/21   Magda Bernheim, NP  ipratropium (ATROVENT) 0.06 % nasal spray Use 1 to 2 sprays each nostril 2 to 3 x /day as needed Patient taking differently: Place 1-2 sprays into both nostrils See admin instructions. Use 1 to 2 sprays each nostril 2 to 3 x /day as needed 11/04/20   [name], MD  ipratropium-albuterol (DUONEB) 0.5-2.5 (3) MG/3ML SOLN Take 3 mLs by nebulization 3 (three) times daily. 07/31/20   Sheikh, Omair Latif, DO  ketoconazole (NIZORAL) 2 % cream APPLY TO AFFECTED AREA TWICE A DAY Patient taking differently: Apply 1 application topically 2 (two) times daily. For facial itching 11/16/19   [name], MD  Lancets [facility] ULTRASOFT) lancets Use as instructed Patient taking differently: 1 each by Other route as needed for other. Use as instructed 04/27/16   Philemon Kingdom, MD  LORazepam (ATIVAN) 0.5 MG tablet Take 0.5 mg by mouth every 6 (six) hours as needed for sleep. 04/28/21   [provider]  metolazone (ZAROXOLYN) 10 MG tablet Take 1/2 to 1 tablet  Daily for Edema / Swelling of Legs Patient taking  differently: Take 5-10 mg by mouth See admin instructions. Take 1/2 to 1 tablet  Daily for Edema / Swelling of Legs 11/04/20   [name], MD  metoprolol succinate (TOPROL-XL) 25 MG 24 hr tablet Take 1 tablet (25 mg total) by mouth at bedtime. 07/25/19   [facility], Tami Lin, PA  nitroGLYCERIN (NITROSTAT) 0.4 MG SL tablet Place 1 tablet (0.4 mg total) under the tongue every 5 (five) minutes x 3 doses as needed for chest pain. 07/31/20   Sheikh, Omair Latif, DO  NOVOLOG FLEXPEN 100 UNIT/ML FlexPen INJECT 20 UNITS UNDER SKIN THREE TIMES DAILY BEFORE MEALS, IF CBG >130 INCREASE TO 25 UNITS Patient taking differently: Inject 20 Units into the skin See admin instructions. INJECT 20 UNITS UNDER SKIN THREE TIMES DAILY BEFORE MEALS, IF CBG >130 INCREASE TO 25 UNITS 01/07/21   [name], MD  nystatin (MYCOSTATIN) 100000 UNIT/ML suspension 5 ml four times a day, retain in mouth as long as possible (Swish and  Swallow).  Use for 48 hours after symptoms resolve. Patient taking differently: Use as directed 5 mLs in the mouth or throat See admin instructions. Take 5 ml by mouth four times a day, retain in mouth as long as possible (Swish and Swallow).  Use for 48 hours after symptoms resolve. 07/23/20   Garnet Sierras, NP  ondansetron (ZOFRAN) 4 MG tablet Take 1 tablet (4 mg total) by mouth every 6 (six) hours as needed for nausea. 07/31/20   Sheikh, Omair Latif, DO  ONETOUCH VERIO test strip USE AS INSTRUCTED TO CHECK SUGAR 2 TIMES DAILY. Patient taking differently: 1 each by Other route in the morning and at bedtime. 09/21/17   Philemon Kingdom, MD  oxyCODONE (OXY IR/ROXICODONE) 5 MG immediate release tablet Take 5 mg by mouth every 4 (four) hours as needed for severe pain. 04/28/21   [provider]  OXYGEN Inhale 2 L into the lungs continuous.    [provider]  pantoprazole (PROTONIX) 40 MG tablet Take 1 tablet (40 mg total) by mouth 2 (two) times daily as needed. For acid reflux. 06/12/20  06/12/21  Liane Comber, NP  Polyethylene Glycol 400 (BLINK TEARS OP) Place 1 drop into both eyes daily as needed (dry eyes).     [provider]  rOPINIRole (REQUIP) 3 MG tablet TAKE 1 TABLET 4 TIMES A DAY FOR RESTLESS LEGS Patient taking differently: Take 3 mg by mouth See admin instructions. TAKE 1 TABLET 4 TIMES A DAY FOR RESTLESS LEGS 04/13/21   Liane Comber, NP  tiZANidine (ZANAFLEX) 4 MG tablet Take 2 mg by mouth at bedtime as needed for muscle spasms. 07/25/20   [provider]  traMADol Veatrice Bourbon) 50 MG tablet Take 1 tablet every 4 hours to prevent severe cough 12/26/20   [name], MD  traZODone (DESYREL) 50 MG tablet Take 50 mg by mouth at bedtime. 03/04/21   [provider]  triamcinolone (NASACORT) 55 MCG/ACT AERO nasal inhaler Place 2 sprays into the nose at bedtime. Patient taking differently: Place 2 sprays into the nose 2 (two) times daily. 04/16/20 04/16/21  Garnet Sierras, NP  triamcinolone ointment (KENALOG) 0.1 % Apply 1 application topically 2 (two) times daily. 10/08/20   Liane Comber, NP  VICTOZA 18 MG/3ML SOPN INJECT 1.2 MG INTO THE SKIN DAILY AT 12 NOON. 04/29/21   Magda Bernheim, NP   Allergies  Allergen Reactions   Brilinta [Ticagrelor] Shortness Of Breath and Other (See Comments)    Chest pain (also)   Aspirin Other (See Comments)    Can tolerate in small doses (is already on a blood thinner AND has kidney disease)   Nsaids Other (See Comments)    Patient is taking a blood thinner and has kidney disease   Minocycline Hcl Other (See Comments)    Welts   Oruvail [Ketoprofen] Other (See Comments)    Has kidney disease and is taking a blood thinner   Tricor [Fenofibrate] Other (See Comments)    Reaction unknown   Vasotec [Enalaprilat] Other (See Comments)    Reaction unknown   Zinc Other (See Comments)    Reaction unknown   Review of Systems  Unable to perform ROS: Acuity of condition   Physical Exam Vitals and nursing note  reviewed.  Constitutional:      General: She is not in acute distress.    Appearance: She is obese. She is ill-appearing.  Pulmonary:     Effort: No respiratory distress.     Comments: Bipap in  use Skin:    General: Skin is warm and dry.  Neurological:     Mental Status: She is alert and oriented to person, place, and time.     Motor: Weakness present.  Psychiatric:        Attention and Perception: Attention normal.        Behavior: Behavior is cooperative.        Cognition and Memory: Cognition and memory normal.    Vital Signs: BP (!) 109/50    Pulse 80    Temp 98.8 F (37.1 C) (Temporal)    Resp (!) 21    Ht $R'5\' 1"'CA$  (1.549 m)    Wt 113.9 kg    SpO2 97%    BMI 47.45 kg/m          SpO2: SpO2: 97 % O2 Device:SpO2: 97 % O2 Flow Rate: .   IO: Intake/output summary:  Intake/Output Summary (Last 24 hours) at 05/11/2021 1431 Last data filed at 05/11/2021 1282 Gross per 24 hour  Intake 4755.4 ml  Output 400 ml  Net 4355.4 ml    LBM:   Baseline Weight: Weight: 113.9 kg Most recent weight: Weight: 113.9 kg     Palliative Assessment/Data: PPS 20%     Time In: 1430 Time Out: 1630 Time Total: 120 minutes  Greater than 50%  of this time was spent counseling and coordinating care related to the above assessment and plan.  Signed by: Lin Landsman, NP   Please contact Palliative Medicine Team phone at [phone] for questions and concerns.  For individual provider: See Shea Evans

## 2021-05-12 DIAGNOSIS — R0602 Shortness of breath: Secondary | ICD-10-CM

## 2021-05-12 DIAGNOSIS — J9621 Acute and chronic respiratory failure with hypoxia: Secondary | ICD-10-CM | POA: Diagnosis not present

## 2021-05-12 DIAGNOSIS — C9200 Acute myeloblastic leukemia, not having achieved remission: Secondary | ICD-10-CM | POA: Diagnosis not present

## 2021-05-12 DIAGNOSIS — Z66 Do not resuscitate: Secondary | ICD-10-CM

## 2021-05-12 DIAGNOSIS — Z789 Other specified health status: Secondary | ICD-10-CM

## 2021-05-12 DIAGNOSIS — A419 Sepsis, unspecified organism: Secondary | ICD-10-CM | POA: Diagnosis not present

## 2021-05-12 DIAGNOSIS — E119 Type 2 diabetes mellitus without complications: Secondary | ICD-10-CM | POA: Diagnosis not present

## 2021-05-12 MED ORDER — METOPROLOL SUCCINATE ER 25 MG PO TB24
12.5000 mg | ORAL_TABLET | Freq: Every day | ORAL | Status: DC
  Administered 2021-05-12: 12.5 mg via ORAL
  Filled 2021-05-12: qty 1

## 2021-05-12 MED ORDER — ROPINIROLE HCL 1 MG PO TABS
3.0000 mg | ORAL_TABLET | Freq: Four times a day (QID) | ORAL | Status: DC
  Administered 2021-05-12 – 2021-05-13 (×4): 3 mg via ORAL
  Filled 2021-05-12: qty 6
  Filled 2021-05-12 (×2): qty 3
  Filled 2021-05-12: qty 6
  Filled 2021-05-12 (×2): qty 3

## 2021-05-12 MED ORDER — GUAIFENESIN-DM 100-10 MG/5ML PO SYRP
5.0000 mL | ORAL_SOLUTION | ORAL | Status: DC | PRN
  Administered 2021-05-12 – 2021-05-13 (×6): 5 mL via ORAL
  Filled 2021-05-12 (×6): qty 10

## 2021-05-12 NOTE — TOC Initial Note (Addendum)
Transition of Care [facility]) - Initial/Assessment Note    Patient Details  Name: Janet Choi MRN: [id number] Date of Birth: 03-13-1941  Transition of Care [facility]) CM/SW Contact:    Joanne Chars, LCSW Phone Number: 05/12/2021, 1:59 PM  Clinical Narrative:    CSW informed by palliative that pt is residential hospice candidate, already active with [facility] for home hospice.  CSW met with pt and brother Barbaraann Rondo in room, they confirmed decision to pursue residential hospice, confirmed they are active with [facility].  Venia Carbon will be by shortly to assess for residential eligibility.                 1345: Anderson Malta stopped by, she has to confirm eligibility for residential services, but they do not have bed at [facility] place today.    Expected Discharge Plan: Hospice Medical Facility Barriers to Discharge: Other (must enter comment) (pending residential hospice eligibility)   Patient Goals and CMS Choice        Expected Discharge Plan and Services Expected Discharge Plan: [place] In-house Referral: Clinical Social Work   Post Acute Care Choice: Residential Hospice Bed Living arrangements for the past 2 months: [place]                                      Prior Living Arrangements/Services Living arrangements for the past 2 months: Single Family Home Lives with:: Adult Children (with son) Patient language and need for interpreter reviewed:: Yes        Need for Family Participation in Patient Care: Yes (Comment) Care giver support system in place?: Yes (comment) Current home services: Hospice ([facility]) Criminal Activity/Legal Involvement Pertinent to Current Situation/Hospitalization: No - Comment as needed  Activities of Daily Living      Permission Sought/Granted                  Emotional Assessment Appearance:: Appears stated age Attitude/Demeanor/Rapport: Engaged Affect (typically observed): Appropriate,  Pleasant Orientation: : Oriented to Self, Oriented to Place, Oriented to  Time, Oriented to Situation Alcohol / Substance Use: Not Applicable Psych Involvement: No (comment)  Admission diagnosis:  Respiratory failure ([place]) [J96.90] Acute on chronic respiratory failure with hypoxia ([place]) [J96.21] Sepsis with acute hypoxic respiratory failure, due to unspecified organism, unspecified whether septic shock present ([place]) [A41.9, R65.20, J96.01] Patient Active Problem List   Diagnosis Date Noted   Multifocal pneumonia 05/11/2021   Severe sepsis with septic shock ([place]) 05/11/2021   Respiratory failure ([place]) 05/11/2021   Sepsis with acute hypoxic respiratory failure ([place])    Skin lesion of right leg 09/30/2020   Symptomatic anemia    Counseling regarding advance care planning and goals of care    Acute on chronic respiratory failure ([place]) 07/27/2020   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 coli UTI 12/29/2019   Acute renal failure ([place])    Acute lower UTI 12/28/2019   Stage 3b chronic kidney disease (CKD) ([place])    Obesity, Class III, BMI 40-49.9 (morbid obesity) ([place])    Hypotension due to hypovolemia    Acute pulmonary edema ([place]) 12/25/2019   Acute on chronic respiratory failure with hypoxia ([place]) 12/25/2019   Diverticulosis of colon without hemorrhage    Polyp of ascending colon    Polyp of sigmoid colon    Tortuous colon  Gastritis and gastroduodenitis    Hiatal hernia    Anemia 11/08/2019   Thrombocytopenia ([place]) 11/08/2019   Neutropenia ([place]) 11/08/2019   Chronic diarrhea 10/25/2019   Pancreatic insufficiency 10/15/2019   Pancytopenia ([place]) 10/15/2019   Long term (current) use of insulin ([place]) 09/07/2019   Solitary pulmonary nodule 08/29/2019   Chronic bronchitis ([place]) 08/29/2019   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 ([place]) 08/07/2019   Insulin dependent type 2 diabetes mellitus  ([place]) 08/07/2019   Acute on chronic diastolic CHF (congestive heart failure) ([place]) 07/30/2019   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   DJD (degenerative joint disease) of knee 09/11/2018   Diabetic polyneuropathy associated with type 2 diabetes mellitus ([place]) 06/27/2018   Upper airway cough syndrome 01/24/2018   DJD (degenerative joint disease), lumbosacral 06/02/2017   CAD S/P percutaneous coronary angioplasty    History of non-ST elevation myocardial infarction (NSTEMI) 04/14/2016   CKD stage 3 due to type 2 diabetes mellitus ([place]) 04/14/2016   Iron deficiency anemia 08/15/2015   OSA and COPD overlap syndrome ([place]) 04/16/2015   RLS (restless legs syndrome) 03/20/2014   Type 2 diabetes mellitus with hyperlipidemia ([place]) 03/20/2014   Medication management 09/12/2013   Morbid obesity due to excess calories ([place]) 05/14/2013   Vitamin D deficiency 05/14/2013   Hyperlipidemia associated with type 2 diabetes mellitus ([place]) 02/24/2009   Essential hypertension 02/24/2009   Diverticulosis of large intestine 02/24/2009   History of colonic polyps 02/24/2009   PCP:  [name], MD Pharmacy:   [id number] - [place], [place] [address] Phone: [phone] Fax: [phone]     Social Determinants of Health (SDOH) Interventions    Readmission Risk Interventions No flowsheet data found.

## 2021-05-12 NOTE — Plan of Care (Signed)

## 2021-05-12 NOTE — Plan of Care (Signed)

## 2021-05-12 NOTE — Progress Notes (Signed)
Progress Note    Janet Choi  EPP:[id number] DOB: Apr 04, 1941  DOA: 05/10/2021 PCP: [name], MD    Brief Narrative:     Medical records reviewed and are as summarized below:  Janet Choi is an 81 y.o. 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   Patient has had increased cough, shortness of breath, and fevers at home for the past few days.  She had been on 2 to 3 L/min of supplemental oxygen chronically but recently needed to increase this due to the current illness.  She lives with her son who was at work when another relative found the patient to be poorly responsive and called EMS. -removed from BIPAP last night after meeting with palliative care but did better than expected.  So do not anticipate an in hospital death.  ? Home with hospice vs residential hospice.   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 Home with hospice-- do not currently think she will be an in-hopital death  Septic shock d/t multifocal pneumonia; acute on chronic hypoxic respiratory failure   - Presents poorly responsive with saturation 50% on her usual O2 after a few days of fevers, cough, and increased SOB at home  - CXR concerning for multifocal PNA  - Cultures were collected, fluid bolus given, and broad-spectrum  antibiotics and BiPAP started in ED  - Continue antibiotics   AML; pancytopenia  - Patient is not interested in treatment for this aside from blood transfusion if needed for comfort  - ANC 480, ALC 500, Hgb 7.4, and platelets 61k on admission  - Neutropenic precautions, monitor and transfuse if needed     COPD, OSA  - continue breathing treatments    Insulin-dependent DM  - A1c was 9/6% in Dec 2022  - held CBG checks due to comfort focused   CKD IIIb  - SCr is 1.85 on admission, up from 1.54 a month ago  - She was fluid-resuscitated in ED  - no labs due to comfort focused   Chronic diastolic CHF  - Appears compensated  - EF was preserved on TTE in Aug 2021  - Given IVF boluses in ED for hypotension and lactate of 8.3 - ? Resume diuretics -no labs today  obesity Body mass index is 47.45 kg/m.  Hematuria -? Due to foley trauma -patient asking for foley removed  Family Communication/Anticipated D/C date and plan/Code Status    Code Status: DNR Family Communication: at bedside  Disposition Plan: Status is: Inpatient  Remains inpatient appropriate because: safe d/c plan         Medical Consultants:   Palliative care  Subjective:   Still with complaints of restless leg  Objective:    Vitals:   05/11/21 1952 05/11/21 2216 05/12/21 0710 05/12/21 0754  BP: (!) 116/52 (!) 104/57  (!) 137/50  Pulse: 87 88  79  Resp: (!) 21 20  18   Temp: 98.5 F (36.9 C) 99 F (37.2 C)  98.2 F (36.8 C)  TempSrc: Oral Oral  Oral  SpO2: 100% 99% 97% 100%  Weight:      Height:        Intake/Output Summary (Last 24 hours) at 05/12/2021 1045 Last data filed at 05/12/2021 0813 Gross per 24 hour  Intake 240 ml  Output --  Net 240 ml   Filed Weights   05/10/21 2111  Weight: 113.9 kg    Exam:  General: Appearance:    Severely obese female sitting in chair     Lungs:     On [place], occ wheeze  Heart:    Normal heart rate.   MS:   All extremities are intact.     Neurologic:   Awake, alert, able to make needs known       Data Reviewed:   I have personally reviewed following labs and imaging studies:  Labs: Labs show the following:   Basic Metabolic Panel: Recent Labs  Lab 05/10/21 2121 05/10/21 2141 05/11/21 0232  NA 134* 131*   132* 133*  K 5.3* 5.1   5.2* 5.0  CL 91* 93* 92*  CO2 25  --  23  GLUCOSE 493* 491* 466*  BUN 23 29* 29*  CREATININE 1.85* 1.60* 1.95*  CALCIUM 9.1  --  8.3*   GFR Estimated Creatinine Clearance: 27 mL/min (A) (by C-G formula based on SCr of 1.95 mg/dL (H)). Liver Function Tests: Recent Labs  Lab 05/10/21 2121 05/11/21 0232  AST 38 259*  ALT 18 100*  ALKPHOS 68 65  BILITOT 1.6* 2.4*  PROT 6.8 5.6*  ALBUMIN 3.1* 2.5*   No results for input(s): LIPASE, AMYLASE in the last 168 hours. No results for input(s): AMMONIA in the last 168 hours. Coagulation profile Recent Labs  Lab 05/10/21 2156  INR 1.2    CBC: Recent Labs  Lab 05/10/21 2141 05/10/21 2223  WBC  --  1.0*  NEUTROABS  --  0.5*  HGB 16.7*   17.7* 7.4*  HCT 49.0*   52.0* 23.4*  MCV  --  109.3*  PLT  --  61*   Cardiac Enzymes: No results for input(s): CKTOTAL, CKMB, CKMBINDEX, TROPONINI in the last 168 hours. BNP (last 3 results) No results for input(s): PROBNP in the last 8760 hours. CBG: Recent Labs  Lab 05/10/21 2116 05/11/21 0158 05/11/21 0422 05/11/21 0736 05/11/21 1123  GLUCAP 458* 429* 431* 357* 297*   D-Dimer: No results for input(s): DDIMER in the last 72 hours. Hgb A1c: No results for input(s): HGBA1C in the last 72 hours. Lipid Profile: No results for input(s): CHOL, HDL, LDLCALC, TRIG, CHOLHDL, LDLDIRECT in the last 72 hours. Thyroid function studies: No results for input(s): TSH, T4TOTAL, T3FREE, THYROIDAB in the last 72 hours.  Invalid input(s): FREET3 Anemia work up: No results for input(s): VITAMINB12, FOLATE, FERRITIN, TIBC, IRON, RETICCTPCT in the last 72 hours. Sepsis Labs: Recent Labs  Lab  05/10/21 2121 05/10/21 2223 05/11/21 0127 05/11/21 0232 05/11/21 0513  PROCALCITON  --   --   --  13.66  --   WBC  --  1.0*  --   --   --   LATICACIDVEN 8.3*  --  6.5*  --  5.0*    Microbiology Recent Results (from the past 240 hour(s))  Resp Panel by RT-PCR (Flu A&B, Covid) Nasopharyngeal Swab     Status:  None   Collection Time: 05/10/21  9:15 PM   Specimen: Nasopharyngeal Swab; Nasopharyngeal(NP) swabs in vial transport medium  Result Value Ref Range Status   SARS Coronavirus 2 by RT PCR NEGATIVE NEGATIVE Final    Comment: (NOTE) SARS-CoV-2 target nucleic acids are NOT DETECTED.  The SARS-CoV-2 RNA is generally detectable in upper respiratory specimens during the acute phase of infection. The lowest concentration of SARS-CoV-2 viral copies this assay can detect is 138 copies/mL. A negative result does not preclude SARS-Cov-2 infection and should not be used as the sole basis for treatment or other patient management decisions. A negative result may occur with  improper specimen collection/handling, submission of specimen other than nasopharyngeal swab, presence of viral mutation(s) within the areas targeted by this assay, and inadequate number of viral copies(<138 copies/mL). A negative result must be combined with clinical observations, patient history, and epidemiological information. The expected result is Negative.  Fact Sheet for Patients:  EntrepreneurPulse.com.au  Fact Sheet for Healthcare Providers:  IncredibleEmployment.be  This test is no t yet approved or cleared by the [place] FDA and  has been authorized for detection and/or diagnosis of SARS-CoV-2 by FDA under an Emergency Use Authorization (EUA). This EUA will remain  in effect (meaning this test can be used) for the duration of the COVID-19 declaration under Section 564(b)(1) of the Act, 21 U.S.C.section 360bbb-3(b)(1), unless the authorization is terminated  or  revoked sooner.       Influenza A by PCR NEGATIVE NEGATIVE Final   Influenza B by PCR NEGATIVE NEGATIVE Final    Comment: (NOTE) The Xpert Xpress SARS-CoV-2/FLU/RSV plus assay is intended as an aid in the diagnosis of influenza from Nasopharyngeal swab specimens and should not be used as a sole basis for treatment. Nasal washings and aspirates are unacceptable for Xpert Xpress SARS-CoV-2/FLU/RSV testing.  Fact Sheet for Patients: EntrepreneurPulse.com.au  Fact Sheet for Healthcare Providers: IncredibleEmployment.be  This test is not yet approved or cleared by the [place] FDA and has been authorized for detection and/or diagnosis of SARS-CoV-2 by FDA under an Emergency Use Authorization (EUA). This EUA will remain in effect (meaning this test can be used) for the duration of the COVID-19 declaration under Section 564(b)(1) of the Act, 21 U.S.C. section 360bbb-3(b)(1), unless the authorization is terminated or revoked.  Performed at [facility], [place] [address] [place], [place] [address]   Blood Culture (routine x 2)     Status: None (Preliminary result)   Collection Time: 05/10/21  9:19 PM   Specimen: BLOOD LEFT FOREARM  Result Value Ref Range Status   Specimen Description BLOOD LEFT FOREARM  Final   Special Requests   Final    BOTTLES DRAWN AEROBIC AND ANAEROBIC Blood Culture adequate volume   Culture   Final    NO GROWTH 2 DAYS Performed at [facility], [place] [address], [place], [place] [address]    Report Status PENDING  Incomplete  Urine Culture     Status: Abnormal (Preliminary result)   Collection Time: 05/10/21  9:24 PM   Specimen: In/Out Cath Urine  Result Value Ref Range Status   Specimen Description IN/OUT CATH URINE  Final   Special Requests NONE  Final   Culture (A)  Final    >=100,000 COLONIES/mL ESCHERICHIA COLI SUSCEPTIBILITIES TO FOLLOW Performed at [facility], [place] [address],  [place],  [address]    Report Status PENDING  Incomplete  Blood Culture (routine x 2)  Status: None (Preliminary result)   Collection Time: 05/10/21  9:56 PM   Specimen: BLOOD RIGHT HAND  Result Value Ref Range Status   Specimen Description BLOOD RIGHT HAND  Final   Special Requests   Final    BOTTLES DRAWN AEROBIC AND ANAEROBIC Blood Culture adequate volume   Culture   Final    NO GROWTH 2 DAYS Performed at [facility], [address], [place], [place] [address]    Report Status PENDING  Incomplete    Procedures and diagnostic studies:  DG Chest Portable 1 View  Result Date: 05/10/2021 CLINICAL DATA:  Shortness of breath EXAM: PORTABLE CHEST 1 VIEW COMPARISON:  Chest x-ray 07/26/2020, CT chest 07/27/2020, chest x-ray 12/26/2019 FINDINGS: The heart and mediastinal contours are unchanged. Aortic calcification Patchy airspace opacities within bilateral mid to lower lung zone. No pulmonary edema. Likely trace pleural effusions. No pneumothorax. No acute osseous abnormality. IMPRESSION: 1. Findings suggestive of multifocal pneumonia. Likely trace pleural effusions. Followup PA and lateral chest X-ray is recommended in 3-4 weeks following therapy to ensure resolution and exclude underlying malignancy. 2.  Aortic Atherosclerosis (ICD10-I70.0). Electronically Signed   By: Iven Finn M.D.   On: 05/10/2021 21:29    Medications:    antiseptic oral rinse  15 mL Topical BID   ipratropium-albuterol  3 mL Nebulization TID   rOPINIRole  3 mg Oral Q6H   Continuous Infusions:     LOS: 2 days   Geradine Girt  [facility]   How to contact the [facility] Attending or Consulting provider [place] or covering provider during after hours [place], for this patient?  Check the care team in [facility] and look for a) attending/consulting TRH provider listed and b) the [facility] team listed Log into [email or web address] and use [place]'s universal password to access. If you do not have the password,  please contact the hospital operator. Locate the [facility] provider you are looking for under [facility] and page to a number that you can be directly reached. If you still have difficulty reaching the provider, please page the [facility] (Director on Call) for the Hospitalists listed on amion for assistance.  05/12/2021, 10:45 AM

## 2021-05-12 NOTE — Progress Notes (Signed)
75 - Dr. Eliseo Squires was informed that pt had been periodically passing small clots as she attempted to void throughout the day. She was also informed that the NT assigned to that room noted a "medium sized blood stain on the disposable pad" when she went in to assist her. No new orders were given, beside bladder scanning pt prn as this may be a result of hematuria from this AM. CN also made aware of situation at this time.

## 2021-05-12 NOTE — Progress Notes (Signed)
[place] 9Z18 [facility]) Hospitalized Hospice Patient  [name] is a current hospice patient with a terminal diagnosis of AML per Dr. Tomasa Hosteller with [facility]. On 1/1, she was transported to the hospital with worsening shortness of breath. She was admitted with septic shock r/t multifocal PNA, and acute on chronic respiratory failure. This is a related hospital admission per Dr. Tomasa Hosteller with [facility].  Report exchanged with PMT provider. Visited patient and family at the bedside. Yesterday, she was clinically worse and the family was preparing for an imminent death. Today, she is somewhat improved. She is alert, but somnolent. Answers simple questions and quickly disengages. [name] and her family met with PMT and made the decision to stop all abx, shift to full comfort, and request EOL care at [facility].   V/S: 98.9 oral, 128/58, HR 84, RR 19, SPO2 98% on 6 lpm  I&O: 240/700 Labs: lactic acid 5, no CBC or BMET collected today Diagnostics: none today IV/PRN: ativan 1 mg IV x 3, dilaudid 0.5 mg IV x 1, dilaudid 1 mg IV x 1, zithromax 500 mg IV x 1, mixipeme 2 g IV x 1  Ms.Janet Choi remains inpatient appropriate for end of life care and awaiting bed at [facility].   Problem List: - acute on chronic respiratory failure - currently on 6 lpm [place], breathing treatments, has elected to stop all abx - AML - under hospice services for this, no further active treatment - septic shock - multifocal PNA, foregoing any further treatment  GOC: clear, stop all measures and interventions that are not directly aligned with comfort care D/C planning: approved for [facility] not a bed available today. Since she is an active hospice patient, she will need [facility] to transport her to [facility] one we have a bed open.  Family: update brother and sister-in-law at the bedside, attempted to call [name], [place], no answer IDT: hospice team updated  Transfer summary and med list to shadow chart.  Thank  you, Venia Carbon RN, BSN [facility] Liaison

## 2021-05-12 NOTE — Progress Notes (Signed)
Daily Progress Note   Patient Name: Janet Choi       Date: 05/12/2021 DOB: [birth year]  Age: 81 y.o. MRN#: [id number] Attending Physician: Geradine Girt, DO Primary Care Physician: [name], MD Admit Date: 05/10/2021  Reason for Consultation/Follow-up: Disposition, Establishing goals of care, Non pain symptom management, Pain control, Psychosocial/spiritual support, and Terminal Care  Subjective: Chart review performed. Received report from primary RN - no acute concerns.  Went to visit patient at bedside - [name] and his wife were present. Patient was sitting on BSC finishing urinating after just having her foley removed per her request. She was agreeable to visit. She is alert and oriented; however, does intermittently fall asleep during conversation and her eyes are closed most of my visit. No signs or non-verbal gestures of pain or discomfort noted. No respiratory distress or secretions noted; she has slight conversational dyspnea but not significant enough she is concerned. Education provided on current comfort medications ordered PRN. She is on 6L O2 [place] and seems to be tolerating well at this time. She denies pain but endorses feeling extremely weak. Her biggest concern is her restless legs - discussed restarting requip now that she has somewhat stabilized and able to take PO meds. She tells me she takes "two pills in the morning and two at night." Family report that she ate a "couple of bites of eggs" this morning and some sips of milk.   Discussed and reviewed that she has somewhat stabilized today and likely is stable enough for transfer. Discussed returning home vs residential hospice facility. Patient will likely need 24/7 supervision/assistance, which family would not be  able to provide in her current condition. In consideration of critically low WBCs in context of current sepsis/pneumonia not currently on antibiotics, she is at increased risk for another acute respiratory decline, which may not be well managed if she returns home requiring another hospitalization. After discussion patient and family feel they would prefer residential hospice transfer, requesting [facility].   Discussion was had on the topic of restarting antibiotics in context of low WBCs and increased risk for recurrent infections - she is clear not to restart antibiotics and to continue current full comfort care.   She requests to return to bed - RN notified and patient returned to bed where she quickly became  more lethargic.   All questions and concerns addressed. Encouraged to call with questions and/or concerns. PMT card previously provided.  Spoke with pharmacy about requip dosing - recommended 3mg  q6h and not 2 tablets BID.   Length of Stay: 2  Current Medications: Scheduled Meds:   antiseptic oral rinse  15 mL Topical BID   ipratropium-albuterol  3 mL Nebulization TID   metoprolol succinate  12.5 mg Oral QHS   rOPINIRole  3 mg Oral Q6H    Continuous Infusions:   PRN Meds: acetaminophen **OR** acetaminophen, albuterol, guaiFENesin-dextromethorphan, HYDROmorphone (DILAUDID) injection, LORazepam **OR** LORazepam **OR** LORazepam, ondansetron **OR** ondansetron (ZOFRAN) IV, polyvinyl alcohol  Physical Exam Vitals and nursing note reviewed.  Constitutional:      General: She is not in acute distress.    Appearance: She is obese. She is ill-appearing.  Pulmonary:     Effort: No respiratory distress.     Comments: Slight conversational dyspnea Skin:    General: Skin is warm and dry.  Neurological:     Mental Status: She is oriented to person, place, and time. She is lethargic.     Motor: Weakness present.  Psychiatric:        Attention and Perception: Attention normal.         Behavior: Behavior is cooperative.        Cognition and Memory: Cognition and memory normal.            Vital Signs: BP (!) 137/50 (BP Location: Right Arm)    Pulse 79    Temp 98.2 F (36.8 C) (Oral)    Resp 18    Ht 5\' 1"  (1.549 m)    Wt 113.9 kg    SpO2 100%    BMI 47.45 kg/m  SpO2: SpO2: 100 % O2 Device: O2 Device: Nasal Cannula O2 Flow Rate: O2 Flow Rate (L/min): 6 L/min  Intake/output summary:  Intake/Output Summary (Last 24 hours) at 05/12/2021 1135 Last data filed at 05/12/2021 0813 Gross per 24 hour  Intake 240 ml  Output --  Net 240 ml   LBM:   Baseline Weight: Weight: 113.9 kg Most recent weight: Weight: 113.9 kg       Palliative Assessment/Data: PPS 20%      Patient Active Problem List   Diagnosis Date Noted   Multifocal pneumonia 05/11/2021   Severe sepsis with septic shock (HCC) 05/11/2021   Respiratory failure ([place]) 05/11/2021   Sepsis with acute hypoxic respiratory failure (HCC)    Skin lesion of right leg 09/30/2020   Symptomatic anemia    Counseling regarding advance care planning and goals of care    Acute on chronic respiratory failure ([place]) 07/27/2020   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 coli UTI 12/29/2019   Acute renal failure ([place])    Acute lower UTI 12/28/2019   Stage 3b chronic kidney disease (CKD) (HCC)    Obesity, Class III, BMI 40-49.9 (morbid obesity) (HCC)    Hypotension due to hypovolemia    Acute pulmonary edema (HCC) 12/25/2019   Acute on chronic respiratory failure with hypoxia ([place]) 12/25/2019   Diverticulosis of colon without hemorrhage    Polyp of ascending colon    Polyp of sigmoid colon    Tortuous colon    Gastritis and gastroduodenitis    Hiatal hernia    Anemia 11/08/2019   Thrombocytopenia (HCC) 11/08/2019   Neutropenia (HCC) 11/08/2019   Chronic diarrhea 10/25/2019  Pancreatic insufficiency 10/15/2019   Pancytopenia ([place])  10/15/2019   Long term (current) use of insulin ([place]) 09/07/2019   Solitary pulmonary nodule 08/29/2019   Chronic bronchitis ([place]) 08/29/2019   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 ([place]) 08/07/2019   Insulin dependent type 2 diabetes mellitus ([place]) 08/07/2019   Acute on chronic diastolic CHF (congestive heart failure) ([place]) 07/30/2019   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   DJD (degenerative joint disease) of knee 09/11/2018   Diabetic polyneuropathy associated with type 2 diabetes mellitus ([place]) 06/27/2018   Upper airway cough syndrome 01/24/2018   DJD (degenerative joint disease), lumbosacral 06/02/2017   CAD S/P percutaneous coronary angioplasty    History of non-ST elevation myocardial infarction (NSTEMI) 04/14/2016   CKD stage 3 due to type 2 diabetes mellitus ([place]) 04/14/2016   Iron deficiency anemia 08/15/2015   OSA and COPD overlap syndrome ([place]) 04/16/2015   RLS (restless legs syndrome) 03/20/2014   Type 2 diabetes mellitus with hyperlipidemia ([place]) 03/20/2014   Medication management 09/12/2013   Morbid obesity due to excess calories ([place]) 05/14/2013   Vitamin D deficiency 05/14/2013   Hyperlipidemia associated with type 2 diabetes mellitus ([place]) 02/24/2009   Essential hypertension 02/24/2009   Diverticulosis of large intestine 02/24/2009   History of colonic polyps 02/24/2009    Palliative Care Assessment & Plan   Patient Profile: 81 y.o. 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 Patient was admitted on 05/10/2021 with septic shock secondary to multifocal pneumonia, acute on chronic respiratory failure, AML/pancytopenia.    ED Course: Upon arrival to the ED, patient is found to be obtunded  initially, saturating mid 90s on CPAP, tachypneic, tachycardic, and with blood pressure in the low 100s initially.  EKG features sinus tachycardia with rate 136 and chest x-ray concerning for multifocal pneumonia.  Chemistry panel notable for glucose 493, creatinine 1.85, and potassium 5.3.  CBC with ANC 480, ALC 500, hemoglobin 7.4, and platelets 61,000.  Lactic acid was 8.3.  Patient was given 2 L of LR, Zosyn, vancomycin, duo nebs, and placed on BiPAP in the ED.  Assessment: Acute on chronic respiratory failure with hypoxia Septic shock secondary to multifocal pneumonia AML Pancytopenia COPD CKD IIIb Chronic diastolic CHF Hematuria Terminal care  Recommendations/Plan: Continue full comfort measures Continue DNR/DNI as previously documented Patient would like transfer to residential hospice facility, requesting [facility] - evaluation pending TOC and Center liaison notified as well as TOC consulted for: residential hospice placement Agree with restarting requip q6h for restless leg symptoms Continue current comfort focused medication regimen  PMT will continue to follow and support holistically  Goals of Care and Additional Recommendations: Limitations on Scope of Treatment: Full Comfort Care  Code Status:    Code Status Orders  (From admission, onward)           Start     Ordered   05/11/21 1746  Do not attempt resuscitation (DNR)  Continuous       Question Answer Comment  In the event of cardiac or respiratory ARREST Do not call a code blue   In the event of cardiac or respiratory ARREST Do not perform Intubation, CPR, defibrillation or ACLS   In the event of cardiac or respiratory ARREST Use medication by  any route, position, wound care, and other measures to relive pain and suffering. May use oxygen, suction and manual treatment of airway obstruction as needed for comfort.      05/11/21 1758           Code Status History     Date Active Date Inactive Code  Status Order ID Comments User Context   05/11/2021 0056 05/11/2021 1758 DNR [id number]  Vianne Bulls, MD ED   05/10/2021 2154 05/11/2021 0056 DNR [id number]  Varney Baas, MD ED   07/28/2020 1244 07/31/2020 2129 DNR [id number]  Alma Friendly, MD Inpatient   07/27/2020 0523 07/28/2020 1244 Full Code [id number]  Doran Heater, DO ED   12/25/2019 2037 12/29/2019 2123 Full Code [id number]  Charlynne Cousins, MD Inpatient   12/25/2019 1503 12/25/2019 1511 Full Code [id number]  Charlynne Cousins, MD ED   07/24/2019 2313 07/25/2019 1945 Full Code [id number]  Barrett, Evelene Croon, PA-C ED   04/16/2016 2247 04/20/2016 2031 Full Code [id number]  Eileen Stanford, PA-C Inpatient   04/14/2016 2005 04/16/2016 1805 Full Code [id number]  Vianne Bulls, MD ED   08/06/2015 0129 08/07/2015 1809 Full Code [id number]  Norval Morton, MD ED       Prognosis:  Poor in the setting of advanced age, multiple comorbidities, 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 Eliseo Squires, [facility] liaison, TOC  Thank you for allowing the Palliative Medicine Team to assist in the care of this patient.   Total Time 45 minutes Prolonged Time Billed  no       Greater than 50%  of this time was spent counseling and coordinating care related to the above assessment and plan.  Lin Landsman, NP  Please contact Palliative Medicine Team phone at [phone] for questions and concerns.

## 2021-05-12 NOTE — Plan of Care (Signed)
°  Problem: Education: Goal: Knowledge of General Education information will improve Description: Including pain rating scale, medication(s)/side effects and non-pharmacologic comfort measures 05/12/2021 1512 by Damaris Schooner, RN Outcome: Progressing 05/12/2021 1449 by Damaris Schooner, RN Outcome: Progressing   Problem: Health Behavior/Discharge Planning: Goal: Ability to manage health-related needs will improve 05/12/2021 1512 by Damaris Schooner, RN Outcome: Progressing 05/12/2021 1449 by Damaris Schooner, RN Outcome: Progressing   Problem: Clinical Measurements: Goal: Ability to maintain clinical measurements within normal limits will improve 05/12/2021 1512 by Damaris Schooner, RN Outcome: Progressing 05/12/2021 1449 by Damaris Schooner, RN Outcome: Progressing Goal: Will remain free from infection 05/12/2021 1512 by Damaris Schooner, RN Outcome: Progressing 05/12/2021 1449 by Damaris Schooner, RN Outcome: Progressing Goal: Diagnostic test results will improve 05/12/2021 1512 by Damaris Schooner, RN Outcome: Progressing 05/12/2021 1449 by Damaris Schooner, RN Outcome: Progressing Goal: Respiratory complications will improve 05/12/2021 1512 by Damaris Schooner, RN Outcome: Progressing 05/12/2021 1449 by Damaris Schooner, RN Outcome: Progressing Goal: Cardiovascular complication will be avoided 05/12/2021 1512 by Damaris Schooner, RN Outcome: Progressing 05/12/2021 1449 by Damaris Schooner, RN Outcome: Progressing   Problem: Activity: Goal: Risk for activity intolerance will decrease 05/12/2021 1512 by Damaris Schooner, RN Outcome: Progressing 05/12/2021 1449 by Damaris Schooner, RN Outcome: Progressing   Problem: Nutrition: Goal: Adequate nutrition will be maintained 05/12/2021 1512 by Damaris Schooner, RN Outcome: Progressing 05/12/2021 1449 by Damaris Schooner, RN Outcome: Progressing   Problem: Coping: Goal: Level of anxiety will decrease 05/12/2021 1512 by Damaris Schooner, RN Outcome:  Progressing 05/12/2021 1449 by Damaris Schooner, RN Outcome: Progressing   Problem: Elimination: Goal: Will not experience complications related to bowel motility 05/12/2021 1512 by Damaris Schooner, RN Outcome: Progressing 05/12/2021 1449 by Damaris Schooner, RN Outcome: Progressing Goal: Will not experience complications related to urinary retention 05/12/2021 1512 by Damaris Schooner, RN Outcome: Progressing 05/12/2021 1449 by Damaris Schooner, RN Outcome: Progressing   Problem: Pain Managment: Goal: General experience of comfort will improve 05/12/2021 1512 by Damaris Schooner, RN Outcome: Progressing 05/12/2021 1449 by Damaris Schooner, RN Outcome: Progressing   Problem: Safety: Goal: Ability to remain free from injury will improve 05/12/2021 1512 by Damaris Schooner, RN Outcome: Progressing 05/12/2021 2993 by Damaris Schooner, RN Outcome: Progressing   Problem: Skin Integrity: Goal: Risk for impaired skin integrity will decrease 05/12/2021 1512 by Damaris Schooner, RN Outcome: Progressing 05/12/2021 1449 by Damaris Schooner, RN Outcome: Progressing

## 2021-05-13 DIAGNOSIS — C9200 Acute myeloblastic leukemia, not having achieved remission: Secondary | ICD-10-CM | POA: Diagnosis not present

## 2021-05-13 DIAGNOSIS — E119 Type 2 diabetes mellitus without complications: Secondary | ICD-10-CM | POA: Diagnosis not present

## 2021-05-13 DIAGNOSIS — R0989 Other specified symptoms and signs involving the circulatory and respiratory systems: Secondary | ICD-10-CM

## 2021-05-13 DIAGNOSIS — J9621 Acute and chronic respiratory failure with hypoxia: Secondary | ICD-10-CM | POA: Diagnosis not present

## 2021-05-13 DIAGNOSIS — A419 Sepsis, unspecified organism: Secondary | ICD-10-CM | POA: Diagnosis not present

## 2021-05-13 LAB — URINE CULTURE: Culture: >=100000 — AB

## 2021-05-13 MED ORDER — GLYCOPYRROLATE 1 MG PO TABS
1.0000 mg | ORAL_TABLET | ORAL | Status: DC | PRN
  Administered 2021-05-13: 1 mg via ORAL
  Filled 2021-05-13 (×2): qty 1

## 2021-05-13 MED ORDER — CEFDINIR 300 MG PO CAPS: 300.0000 mg | ORAL_CAPSULE | ORAL | 0 refills | Status: DC

## 2021-05-13 MED ORDER — CEFDINIR 300 MG PO CAPS
300.0000 mg | ORAL_CAPSULE | ORAL | Status: DC
  Administered 2021-05-13: 300 mg via ORAL
  Filled 2021-05-13: qty 1

## 2021-05-13 MED ORDER — HYDROMORPHONE HCL 2 MG PO TABS: 1.0000 mg | ORAL_TABLET | ORAL | Status: DC | PRN

## 2021-05-13 MED ORDER — CEFDINIR 300 MG PO CAPS
300.0000 mg | ORAL_CAPSULE | ORAL | 0 refills | Status: DC
Start: 2021-05-13 — End: 2021-05-13

## 2021-05-13 MED ORDER — HYDROMORPHONE HCL 2 MG PO TABS: 2.0000 mg | ORAL_TABLET | ORAL | Status: DC | PRN

## 2021-05-13 NOTE — Progress Notes (Signed)
Daily Progress Note   Patient Name: Janet Choi       Date: 05/13/2021 DOB: 1940-12-26  Age: 81 y.o. MRN#: [id number] Attending Physician: Caren Griffins, MD Primary Care Physician: [name], MD Admit Date: 05/10/2021  Reason for Consultation/Follow-up: Non pain symptom management, Pain control, Psychosocial/spiritual support, and Terminal Care  Subjective: Chart review performed - patient starting having dysuria post foley removal - antibiotic was started with comfort focused approach. Also noted bed available at [facility] today. Received report from primary RN - no acute concerns. RN reports patient's oral intake remains minimal. Patient also lost IV access last night - will transition meds to oral as appropriate.   Went to visit patient at bedside - sister/Janet Choi, brother/Janet Choi, and [name] were present. Patient was lying in bed awake, alert, oriented - she is awake and ate small bites of an ice pop - she tells me "it's my favorite." No signs or non-verbal gestures of pain or discomfort noted. No respiratory distress or increased work of breathing; productive cough and secretions noted. Patient reports cough medication has helped. Patient denies pain or shortness of breat at this time but again she does seem to have slight conversational dyspnea. She remains on 5L O2 [place].  Patient and family confirm goal is for discharge to [facility] today. Discussed process for transport, including premedication.   After patient's several bites of ice pop and discussion as above, patient quickly fell asleep.  Emotional support and therapeutic listening provided to family as they are understandably tearful, knowing she may pass within the next several days to week. Natural trajectory at EOL was  reviewed.  All questions and concerns addressed. Encouraged to call with questions and/or concerns. PMT card previously provided.  Requested RN administer robinul now and pre-medicate patient prior to transport.   Length of Stay: 3  Current Medications: Scheduled Meds:   antiseptic oral rinse  15 mL Topical BID   cefdinir  300 mg Oral Q24H   ipratropium-albuterol  3 mL Nebulization TID   metoprolol succinate  12.5 mg Oral QHS   rOPINIRole  3 mg Oral Q6H    Continuous Infusions:   PRN Meds: acetaminophen **OR** acetaminophen, albuterol, guaiFENesin-dextromethorphan, HYDROmorphone (DILAUDID) injection, LORazepam **OR** LORazepam **OR** LORazepam, ondansetron **OR** ondansetron (ZOFRAN) IV, polyvinyl alcohol  Physical Exam Vitals and nursing  note reviewed.  Constitutional:      General: She is not in acute distress.    Appearance: She is ill-appearing.  Pulmonary:     Effort: No respiratory distress.     Comments: Slight conversational dyspnea Skin:    General: Skin is warm and dry.  Neurological:     Mental Status: She is oriented to person, place, and time. She is lethargic.     Motor: Weakness present.  Psychiatric:        Attention and Perception: Attention normal.        Behavior: Behavior is cooperative.        Cognition and Memory: Cognition and memory normal.            Vital Signs: BP (!) 129/49 (BP Location: Right Arm)    Pulse 71    Temp 99.1 F (37.3 C) (Oral)    Resp 19    Ht 5\' 1"  (1.549 m)    Wt 113.9 kg    SpO2 98%    BMI 47.45 kg/m  SpO2: SpO2: 98 % O2 Device: O2 Device: Nasal Cannula O2 Flow Rate: O2 Flow Rate (L/min): 6 L/min  Intake/output summary:  Intake/Output Summary (Last 24 hours) at 05/13/2021 1006 Last data filed at 05/12/2021 1700 Gross per 24 hour  Intake 120 ml  Output 700 ml  Net -580 ml   LBM: Last BM Date: 05/12/21 Baseline Weight: Weight: 113.9 kg Most recent weight: Weight: 113.9 kg       Palliative Assessment/Data: PPS  20%      Patient Active Problem List   Diagnosis Date Noted   Multifocal pneumonia 05/11/2021   Severe sepsis with septic shock (HCC) 05/11/2021   Respiratory failure ([place]) 05/11/2021   Sepsis with acute hypoxic respiratory failure (HCC)    Skin lesion of right leg 09/30/2020   Symptomatic anemia    Counseling regarding advance care planning and goals of care    Acute on chronic respiratory failure ([place]) 07/27/2020   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 coli UTI 12/29/2019   Acute renal failure ([place])    Acute lower UTI 12/28/2019   Stage 3b chronic kidney disease (CKD) (HCC)    Obesity, Class III, BMI 40-49.9 (morbid obesity) (HCC)    Hypotension due to hypovolemia    Acute pulmonary edema (HCC) 12/25/2019   Acute on chronic respiratory failure with hypoxia (HCC) 12/25/2019   Diverticulosis of colon without hemorrhage    Polyp of ascending colon    Polyp of sigmoid colon    Tortuous colon    Gastritis and gastroduodenitis    Hiatal hernia    Anemia 11/08/2019   Thrombocytopenia (HCC) 11/08/2019   Neutropenia (HCC) 11/08/2019   Chronic diarrhea 10/25/2019   Pancreatic insufficiency 10/15/2019   Pancytopenia ([place]) 10/15/2019   Long term (current) use of insulin ([place]) 09/07/2019   Solitary pulmonary nodule 08/29/2019   Chronic bronchitis ([place]) 08/29/2019   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 ([place]) 08/07/2019   Insulin dependent type 2 diabetes mellitus ([place]) 08/07/2019   Acute on chronic diastolic CHF (congestive heart failure) ([place]) 07/30/2019   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   DJD (degenerative joint disease) of knee 09/11/2018   Diabetic polyneuropathy associated with type 2 diabetes mellitus ([place]) 06/27/2018   Upper airway cough syndrome 01/24/2018   DJD (degenerative joint disease), lumbosacral 06/02/2017   CAD S/P percutaneous coronary  angioplasty    History of non-ST elevation myocardial infarction (NSTEMI) 04/14/2016   CKD stage 3 due to type 2 diabetes mellitus ([place]) 04/14/2016   Iron deficiency anemia 08/15/2015   OSA and COPD overlap syndrome ([place]) 04/16/2015   RLS (restless legs syndrome) 03/20/2014   Type 2 diabetes mellitus with hyperlipidemia ([place]) 03/20/2014   Medication management 09/12/2013   Morbid obesity due to excess calories ([place]) 05/14/2013   Vitamin D deficiency 05/14/2013   Hyperlipidemia associated with type 2 diabetes mellitus ([place]) 02/24/2009   Essential hypertension 02/24/2009   Diverticulosis of large intestine 02/24/2009   History of colonic polyps 02/24/2009    Palliative Care Assessment & Plan   Patient Profile: 81 y.o. 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 Patient was admitted on 05/10/2021 with septic shock secondary to multifocal pneumonia, acute on chronic respiratory failure, AML/pancytopenia.    ED Course: Upon arrival to the ED, patient is found to be obtunded initially, saturating mid 90s on CPAP, tachypneic, tachycardic, and with blood pressure in the low 100s initially.  EKG features sinus tachycardia with rate 136 and chest x-ray concerning for multifocal pneumonia.  Chemistry panel notable for glucose 493, creatinine 1.85, and potassium 5.3.  CBC with ANC 480, ALC 500, hemoglobin 7.4, and platelets 61,000.  Lactic acid was 8.3.  Patient was given 2 L of LR, Zosyn, vancomycin, duo nebs, and placed on BiPAP in the ED.  Assessment: Acute on chronic respiratory failure with hypoxia Septic shock secondary to multifocal pneumonia AML Pancytopenia COPD CKD IIIb Chronic diastolic CHF Hematuria Terminal care  Recommendations/Plan: Continue  full comfort measures Continue DNR/DNI as previously documented Transfer to [facility] today  Patient no longer has IV access - switched IV dilaudid to PO and started robinul PO Agree with antibiotic for dysuria - treating for comfort Pre-medicate patient prior to hospice transfer per orders entered PMT will continue to follow peripherally. If there are any imminent needs please call the service directly   Goals of Care and Additional Recommendations: Limitations on Scope of Treatment: Full Comfort Care  Code Status:    Code Status Orders  (From admission, onward)           Start     Ordered   05/11/21 1746  Do not attempt resuscitation (DNR)  Continuous       Question Answer Comment  In the event of cardiac or respiratory ARREST Do not call a code blue   In the event of cardiac or respiratory ARREST Do not perform Intubation, CPR, defibrillation or ACLS   In the event of cardiac or respiratory ARREST Use medication by any route, position, wound care, and other measures to relive pain and suffering. May use oxygen, suction and manual treatment of airway obstruction as needed for comfort.      05/11/21 1758           Code Status History     Date Active Date Inactive Code Status Order ID Comments User Context   05/11/2021 0056 05/11/2021 1758 DNR [id number]  Vianne Bulls, MD ED   05/10/2021 2154 05/11/2021 0056 DNR [id number]  Varney Baas, MD ED   07/28/2020 1244 07/31/2020 2129 DNR [id number]  Alma Friendly, MD Inpatient   07/27/2020 [phone] 07/28/2020 1244  Full Code [id number]  Doran Heater, DO ED   12/25/2019 2037 12/29/2019 2123 Full Code [id number]  Charlynne Cousins, MD Inpatient   12/25/2019 1503 12/25/2019 1511 Full Code [id number]  Charlynne Cousins, MD ED   07/24/2019 2313 07/25/2019 1945 Full Code [id number]  Barrett, Evelene Croon, PA-C ED   04/16/2016 2247 04/20/2016 2031 Full Code [id number]  Eileen Stanford, PA-C Inpatient   04/14/2016 2005 04/16/2016 1805  Full Code [id number]  Vianne Bulls, MD ED   08/06/2015 0129 08/07/2015 1809 Full Code [id number]  Norval Morton, MD ED       Prognosis:  < 2 weeks - in the setting of advanced age, multiple comorbidities, 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   Total Time 35 minutes Prolonged Time Billed  no       Greater than 50%  of this time was spent counseling and coordinating care related to the above assessment and plan.  Lin Landsman, NP  Please contact Palliative Medicine Team phone at [phone] for questions and concerns.

## 2021-05-13 NOTE — Progress Notes (Signed)
Patient complains of not being able to void completely. Pt used BSC a couple times with small amount of dark amber urine. Pt had a shower this morning and was able to void but complains of burning sensation when peeing. Noted a small sized blood clot upon getting up from the shower bench. Pt's sister was concerned of pt possibly having UTI.

## 2021-05-13 NOTE — Discharge Summary (Addendum)
Physician Discharge Summary  Janet Choi ZOX:[id number] DOB: [birth year] DOA: 05/10/2021  PCP: [name], MD  Admit date: 05/10/2021 Discharge date: 05/13/2021  Admitted From: home Disposition:  residential hospice for end of life care  Discharge Condition: stable CODE STATUS: DNR Diet recommendation: comfort feeding  HPI: Per admitting MD, Janet Choi is a pleasant 81 y.o. 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  Patient's son is at the bedside and assists with the history.  Patient has had increased cough, shortness of breath, and fevers at home for the past few days.  She had been on 2 to 3 L/min of supplemental oxygen chronically but recently needed to increase this due to the current illness.  She lives with her son who was at work when another relative found the patient to be poorly responsive and called EMS.  Patient was saturating 50% on 3 L/min of supplemental oxygen, placed on CPAP, given breathing treatment, and brought into the ED.  Patient's mentation improving in the ED on BiPAP and able to provide a little bit of additional history, denies any chest or abdominal pain, but complains of leg pain that she attributes to her restless leg syndrome.  Patient confirms that she would not want any heroic measures but would like to continue BiPAP and antibiotics, and hopes that she may improve from this acute illness.  Hospital Course / Discharge diagnoses: Principal problem  Septic shock d/t multifocal pneumonia; acute on chronic hypoxic respiratory failure- Presents poorly responsive with saturation 50% on her usual O2 after a few days of fevers, cough, and increased SOB at home. CXR concerning for multifocal PNA. Given very poor prognosis, palliative  consulted and decision was made to transition to comfort care. She will be discharged to [facility]   Active problems AML, pancytopenia  - Patient is not interested in treatment for this COPD, OSA  Insulin-dependent DM, poorly controlled, with hyperglycemia  CKD IIIb  Acute on chronic chronic diastolic CHF  Obesity, class III Hematuria due to presumed UTI -quite uncomfortable with dysuria, cultures positive for E coli. Received 1 dose of Omnicef prior to dc. Comfort focus approach Elavated LFTs - due to septic shock Hypoalbuminemia Hyponatremia Hyperkalemia   Discharge Instructions   Allergies as of 05/13/2021       Reactions   Brilinta [ticagrelor] Shortness Of Breath, Other (See Comments)   Chest pain (also)   Aspirin Other (See Comments)   Can tolerate in small doses (is already on a blood thinner AND has kidney disease)   Nsaids Other (See Comments)   Patient is taking a blood thinner and has kidney disease   Minocycline Hcl Other (See Comments)   Welts   Oruvail [ketoprofen] Other (See Comments)   Has kidney disease and is taking a blood thinner   Tricor [fenofibrate] Other (See Comments)   Reaction unknown   Vasotec [enalaprilat] Other (See Comments)   Reaction unknown   Zinc Other (See Comments)   Reaction unknown        Medication List     STOP taking these medications    acetaminophen 500 MG tablet Commonly known as: TYLENOL   Basaglar KwikPen 100 UNIT/ML   BD Pen Needle Nano 2nd Gen 32G X 4 MM Misc Generic drug: Insulin Pen Needle   Cholecalciferol 125  MCG (5000 UT) capsule   diclofenac sodium 1 % Gel Commonly known as: VOLTAREN   escitalopram 20 MG tablet Commonly known as: Lexapro   ferrous sulfate 325 (65 FE) MG tablet   FreeStyle Libre 2 Reader [employer] 2 Sensor Misc   hydrOXYzine 10 MG tablet Commonly known as: ATARAX   ketoconazole 2 % cream Commonly known as: NIZORAL   nitroGLYCERIN 0.4 MG SL tablet Commonly  known as: NITROSTAT   NovoLOG FlexPen 100 UNIT/ML FlexPen Generic drug: insulin aspart   nystatin 100000 UNIT/ML suspension Commonly known as: MYCOSTATIN   onetouch ultrasoft lancets   OneTouch Verio test strip Generic drug: glucose blood   pantoprazole 40 MG tablet Commonly known as: Protonix   triamcinolone ointment 0.1 % Commonly known as: KENALOG   Victoza 18 MG/3ML Sopn Generic drug: liraglutide   VISTARIL PO   vitamin C 1000 MG tablet       TAKE these medications    albuterol 108 (90 Base) MCG/ACT inhaler Commonly known as: Ventolin HFA Inhale 2 puffs into the lungs every 4 (four) hours as needed for wheezing or shortness of breath.   benzonatate 100 MG capsule Commonly known as: TESSALON Take 1 perle  3 x /day  as needed to Prevent Cough  /  Patient knows to take by mouth What changed:  how much to take how to take this when to take this   BLINK TEARS OP Place 1 drop into both eyes daily as needed (dry eyes).   furosemide 40 MG tablet Commonly known as: LASIX Take 40 mg by mouth daily as needed for fluid or edema.   gabapentin 300 MG capsule Commonly known as: Neurontin Take  1 capsule   3  x /day  for Painful Diabetic Neuropathy What changed: Another medication with the same name was removed. Continue taking this medication, and follow the directions you see here.   HYDROcodone-acetaminophen 5-325 MG tablet Commonly known as: NORCO/VICODIN Take 1 tablet by mouth every 4 (four) hours as needed for moderate pain.   ipratropium 0.06 % nasal spray Commonly known as: ATROVENT Use 1 to 2 sprays each nostril 2 to 3 x /day as needed What changed:  how much to take how to take this when to take this   ipratropium-albuterol 0.5-2.5 (3) MG/3ML Soln Commonly known as: DUONEB Take 3 mLs by nebulization 3 (three) times daily.   LORazepam 0.5 MG tablet Commonly known as: ATIVAN Take 0.5 mg by mouth every 6 (six) hours as needed for sleep.    metolazone 10 MG tablet Commonly known as: ZAROXOLYN Take 1/2 to 1 tablet  Daily for Edema / Swelling of Legs What changed:  how much to take how to take this when to take this   metoprolol succinate 25 MG 24 hr tablet Commonly known as: TOPROL-XL Take 1 tablet (25 mg total) by mouth at bedtime.   ondansetron 4 MG tablet Commonly known as: ZOFRAN Take 1 tablet (4 mg total) by mouth every 6 (six) hours as needed for nausea.   oxyCODONE 5 MG immediate release tablet Commonly known as: Oxy IR/ROXICODONE Take 5 mg by mouth every 4 (four) hours as needed for severe pain.   OXYGEN Inhale 2 L into the lungs continuous.   rOPINIRole 3 MG tablet Commonly known as: REQUIP TAKE 1 TABLET 4 TIMES A DAY FOR RESTLESS LEGS What changed: See the new instructions.   tiZANidine 4 MG tablet Commonly known as: ZANAFLEX Take 2 mg by  mouth at bedtime as needed for muscle spasms.   traMADol 50 MG tablet Commonly known as: Ultram Take 1 tablet every 4 hours to prevent severe cough   traZODone 50 MG tablet Commonly known as: DESYREL Take 50 mg by mouth at bedtime.   triamcinolone 55 MCG/ACT Aero nasal inhaler Commonly known as: NASACORT Place 2 sprays into the nose at bedtime. What changed: when to take this       Consultations: Palliative care  Procedures/Studies:  DG Chest Portable 1 View  Result Date: 05/10/2021 CLINICAL DATA:  Shortness of breath EXAM: PORTABLE CHEST 1 VIEW COMPARISON:  Chest x-ray 07/26/2020, CT chest 07/27/2020, chest x-ray 12/26/2019 FINDINGS: The heart and mediastinal contours are unchanged. Aortic calcification Patchy airspace opacities within bilateral mid to lower lung zone. No pulmonary edema. Likely trace pleural effusions. No pneumothorax. No acute osseous abnormality. IMPRESSION: 1. Findings suggestive of multifocal pneumonia. Likely trace pleural effusions. Followup PA and lateral chest X-ray is recommended in 3-4 weeks following therapy to ensure  resolution and exclude underlying malignancy. 2.  Aortic Atherosclerosis (ICD10-I70.0). Electronically Signed   By: Iven Finn M.D.   On: 05/10/2021 21:29     Subjective: Dyspneic at times  Discharge Exam: BP (!) 129/49 (BP Location: Right Arm)    Pulse 71    Temp 99.1 F (37.3 C) (Oral)    Resp 19    Ht 5\' 1"  (1.549 m)    Wt 113.9 kg    SpO2 98%    BMI 47.45 kg/m   General: Pt is alert, awake, not in acute distress Cardiovascular: RRR, S1/S2 +, no rubs, no gallops Respiratory: CTA bilaterally, no wheezing, no rhonchi Abdominal: Soft, NT, ND, bowel sounds + Extremities: no edema, no cyanosis    The results of significant diagnostics from this hospitalization (including imaging, microbiology, ancillary and laboratory) are listed below for reference.     Microbiology: Recent Results (from the past 240 hour(s))  Resp Panel by RT-PCR (Flu A&B, Covid) Nasopharyngeal Swab     Status: None   Collection Time: 05/10/21  9:15 PM   Specimen: Nasopharyngeal Swab; Nasopharyngeal(NP) swabs in vial transport medium  Result Value Ref Range Status   SARS Coronavirus 2 by RT PCR NEGATIVE NEGATIVE Final    Comment: (NOTE) SARS-CoV-2 target nucleic acids are NOT DETECTED.  The SARS-CoV-2 RNA is generally detectable in upper respiratory specimens during the acute phase of infection. The lowest concentration of SARS-CoV-2 viral copies this assay can detect is 138 copies/mL. A negative result does not preclude SARS-Cov-2 infection and should not be used as the sole basis for treatment or other patient management decisions. A negative result may occur with  improper specimen collection/handling, submission of specimen other than nasopharyngeal swab, presence of viral mutation(s) within the areas targeted by this assay, and inadequate number of viral copies(<138 copies/mL). A negative result must be combined with clinical observations, patient history, and epidemiological information. The  expected result is Negative.  Fact Sheet for Patients:  EntrepreneurPulse.com.au  Fact Sheet for Healthcare Providers:  IncredibleEmployment.be  This test is no t yet approved or cleared by the [place] FDA and  has been authorized for detection and/or diagnosis of SARS-CoV-2 by FDA under an Emergency Use Authorization (EUA). This EUA will remain  in effect (meaning this test can be used) for the duration of the COVID-19 declaration under Section 564(b)(1) of the Act, 21 U.S.C.section 360bbb-3(b)(1), unless the authorization is terminated  or revoked sooner.       Influenza  A by PCR NEGATIVE NEGATIVE Final   Influenza B by PCR NEGATIVE NEGATIVE Final    Comment: (NOTE) The Xpert Xpress SARS-CoV-2/FLU/RSV plus assay is intended as an aid in the diagnosis of influenza from Nasopharyngeal swab specimens and should not be used as a sole basis for treatment. Nasal washings and aspirates are unacceptable for Xpert Xpress SARS-CoV-2/FLU/RSV testing.  Fact Sheet for Patients: EntrepreneurPulse.com.au  Fact Sheet for Healthcare Providers: IncredibleEmployment.be  This test is not yet approved or cleared by the [place] FDA and has been authorized for detection and/or diagnosis of SARS-CoV-2 by FDA under an Emergency Use Authorization (EUA). This EUA will remain in effect (meaning this test can be used) for the duration of the COVID-19 declaration under Section 564(b)(1) of the Act, 21 U.S.C. section 360bbb-3(b)(1), unless the authorization is terminated or revoked.  Performed at [facility], [place] [address], [place], [place] [address]   Blood Culture (routine x 2)     Status: None (Preliminary result)   Collection Time: 05/10/21  9:19 PM   Specimen: BLOOD LEFT FOREARM  Result Value Ref Range Status   Specimen Description BLOOD LEFT FOREARM  Final   Special Requests   Final    BOTTLES DRAWN  AEROBIC AND ANAEROBIC Blood Culture adequate volume   Culture   Final    NO GROWTH 3 DAYS Performed at [facility], [place] [address], [place], [place] [address]    Report Status PENDING  Incomplete  Urine Culture     Status: Abnormal   Collection Time: 05/10/21  9:24 PM   Specimen: In/Out Cath Urine  Result Value Ref Range Status   Specimen Description IN/OUT CATH URINE  Final   Special Requests   Final    NONE Performed at [facility], [place] [address] [place], [place] [address]    Culture >=100,000 COLONIES/mL ESCHERICHIA COLI (A)  Final   Report Status 05/13/2021 FINAL  Final   Organism ID, Bacteria ESCHERICHIA COLI (A)  Final      Susceptibility   Escherichia coli - MIC*    AMPICILLIN >=32 RESISTANT Resistant     CEFAZOLIN >=64 RESISTANT Resistant     CEFEPIME <=0.12 SENSITIVE Sensitive     CEFTRIAXONE <=0.25 SENSITIVE Sensitive     CIPROFLOXACIN <=0.25 SENSITIVE Sensitive     GENTAMICIN <=1 SENSITIVE Sensitive     IMIPENEM <=0.25 SENSITIVE Sensitive     NITROFURANTOIN <=16 SENSITIVE Sensitive     TRIMETH/SULFA <=20 SENSITIVE Sensitive     AMPICILLIN/SULBACTAM >=32 RESISTANT Resistant     PIP/TAZO 8 SENSITIVE Sensitive     * >=100,000 COLONIES/mL ESCHERICHIA COLI  Blood Culture (routine x 2)     Status: None (Preliminary result)   Collection Time: 05/10/21  9:56 PM   Specimen: BLOOD RIGHT HAND  Result Value Ref Range Status   Specimen Description BLOOD RIGHT HAND  Final   Special Requests   Final    BOTTLES DRAWN AEROBIC AND ANAEROBIC Blood Culture adequate volume   Culture   Final    NO GROWTH 3 DAYS Performed at [facility], [address] [place], [place] [address]    Report Status PENDING  Incomplete     Labs: Basic Metabolic Panel: Recent Labs  Lab 05/10/21 2121 05/10/21 2141 05/11/21 0232  NA 134* 131*   132* 133*  K 5.3* 5.1   5.2* 5.0  CL 91* 93* 92*  CO2 25  --  23  GLUCOSE 493* 491*  466*  BUN 23 29* 29*  CREATININE 1.85*  1.60* 1.95*  CALCIUM 9.1  --  8.3*   Liver Function Tests: Recent Labs  Lab 05/10/21 2121 05/11/21 0232  AST 38 259*  ALT 18 100*  ALKPHOS 68 65  BILITOT 1.6* 2.4*  PROT 6.8 5.6*  ALBUMIN 3.1* 2.5*   CBC: Recent Labs  Lab 05/10/21 2141 05/10/21 2223  WBC  --  1.0*  NEUTROABS  --  0.5*  HGB 16.7*   17.7* 7.4*  HCT 49.0*   52.0* 23.4*  MCV  --  109.3*  PLT  --  61*   CBG: Recent Labs  Lab 05/10/21 2116 05/11/21 0158 05/11/21 0422 05/11/21 0736 05/11/21 1123  GLUCAP 458* 429* 431* 357* 297*   Hgb A1c No results for input(s): HGBA1C in the last 72 hours. Lipid Profile No results for input(s): CHOL, HDL, LDLCALC, TRIG, CHOLHDL, LDLDIRECT in the last 72 hours. Thyroid function studies No results for input(s): TSH, T4TOTAL, T3FREE, THYROIDAB in the last 72 hours.  Invalid input(s): FREET3 Urinalysis    Component Value Date/Time   COLORURINE YELLOW 05/10/2021 2124   APPEARANCEUR HAZY (A) 05/10/2021 2124   LABSPEC 1.018 05/10/2021 2124   PHURINE 5.0 05/10/2021 2124   GLUCOSEU >=500 (A) 05/10/2021 2124   HGBUR NEGATIVE 05/10/2021 2124   BILIRUBINUR NEGATIVE 05/10/2021 2124   KETONESUR NEGATIVE 05/10/2021 2124   PROTEINUR >=300 (A) 05/10/2021 2124   UROBILINOGEN 0.2 08/27/2013 1427   NITRITE NEGATIVE 05/10/2021 2124   LEUKOCYTESUR NEGATIVE 05/10/2021 2124    FURTHER DISCHARGE INSTRUCTIONS:   Get Medicines reviewed and adjusted: Please take all your medications with you for your next visit with your Primary MD   Laboratory/radiological data: Please request your Primary MD to go over all hospital tests and procedure/radiological results at the follow up, please ask your Primary MD to get all Hospital records sent to his/her office.   In some cases, they will be blood work, cultures and biopsy results pending at the time of your discharge. Please request that your primary care M.D. goes through all the records of your hospital data and follows up on these  results.   Also Note the following: If you experience worsening of your admission symptoms, develop shortness of breath, life threatening emergency, suicidal or homicidal thoughts you must seek medical attention immediately by calling 911 or calling your MD immediately  if symptoms less severe.   You must read complete instructions/literature along with all the possible adverse reactions/side effects for all the Medicines you take and that have been prescribed to you. Take any new Medicines after you have completely understood and accpet all the possible adverse reactions/side effects.    Do not drive when taking Pain medications or sleeping medications (Benzodaizepines)   Do not take more than prescribed Pain, Sleep and Anxiety Medications. It is not advisable to combine anxiety,sleep and pain medications without talking with your primary care practitioner   Special Instructions: If you have smoked or chewed Tobacco  in the last 2 yrs please stop smoking, stop any regular Alcohol  and or any Recreational drug use.   Wear Seat belts while driving.   Please note: You were cared for by a hospitalist during your hospital stay. Once you are discharged, your primary care physician will handle any further medical issues. Please note that NO REFILLS for any discharge medications will be authorized once you are discharged, as it is imperative that you return to your primary care physician (or establish  a relationship with a primary care physician if you do not have one) for your post hospital discharge needs so that they can reassess your need for medications and monitor your lab values.  Time coordinating discharge: 25 minutes  SIGNED:  Marzetta Board, MD, PhD 05/13/2021, 10:11 AM

## 2021-05-13 NOTE — Progress Notes (Signed)
[facility] ([facility]) Hospital Liaison note.     This patient is approved to transfer to [facility] today.    [facility] will notify TOC when registration paperwork has been completed to arrange transport.    RN please call report to [phone].   Thank you,     Mary Anne Robertson, RN, CCM       [facility] Hospital Liaison  [phone] 

## 2021-05-13 NOTE — Progress Notes (Signed)
Report has been called to [facility], at [employer] [facility]. Pt awaiting transport by [facility] at this time.

## 2021-05-13 NOTE — Progress Notes (Signed)
[employer] [facility]) Hospital Liaison note.     Consents for [employer] are complete. Please arrange transport with [facility].   RN please call report to [phone].    Thank you,       Farrel Gordon, RN, [facility] Hospital Liaison  [phone]

## 2021-05-13 NOTE — TOC Transition Note (Signed)
Transition of Care [facility]) - CM/SW Discharge Note   Patient Details  Name: Janet Choi MRN: [id number] Date of Birth: Feb 22, 1941  Transition of Care [facility]) CM/SW Contact:  Joanne Chars, LCSW Phone Number: 05/13/2021, 11:53 AM   Clinical Narrative:  Pt discharging to [facility].   RN please call report to [phone].       Final next level of care: [place] Barriers to Discharge: Barriers Resolved   Patient Goals and CMS Choice        Discharge Placement              Patient chooses bed at:  [facility]) Patient to be transferred to facility by: [place] [facility] Name of family member notified: brother rodney in room Patient and family notified of of transfer: 05/13/21  Discharge Plan and Services In-house Referral: Clinical Social Work   Post Acute Care Choice: Residential Hospice Bed                               Social Determinants of Health (SDOH) Interventions     Readmission Risk Interventions No flowsheet data found.

## 2021-05-13 NOTE — Plan of Care (Signed)
Pt denies feeling any pain at the time of assessment. Pt was a bit fatigued after she had a shower this morning and only ate half of her oatmeal and half a cup of orange. Pt was administered Ativan for her agitation/restlessness and transferred to the [facility] to void. Pt was assisted back to bed when finished and left to rest. Family is currently at bed side.   Problem: Education: Goal: Knowledge of General Education information will improve Description: Including pain rating scale, medication(s)/side effects and non-pharmacologic comfort measures Outcome: Progressing   Problem: Health Behavior/Discharge Planning: Goal: Ability to manage health-related needs will improve Outcome: Progressing   Problem: Clinical Measurements: Goal: Ability to maintain clinical measurements within normal limits will improve Outcome: Progressing Goal: Will remain free from infection Outcome: Progressing Goal: Diagnostic test results will improve Outcome: Progressing Goal: Respiratory complications will improve Outcome: Progressing Goal: Cardiovascular complication will be avoided Outcome: Progressing   Problem: Activity: Goal: Risk for activity intolerance will decrease Outcome: Progressing   Problem: Nutrition: Goal: Adequate nutrition will be maintained Outcome: Progressing   Problem: Coping: Goal: Level of anxiety will decrease Outcome: Progressing   Problem: Elimination: Goal: Will not experience complications related to bowel motility Outcome: Progressing Goal: Will not experience complications related to urinary retention Outcome: Progressing   Problem: Pain Managment: Goal: General experience of comfort will improve Outcome: Progressing   Problem: Safety: Goal: Ability to remain free from injury will improve Outcome: Progressing   Problem: Skin Integrity: Goal: Risk for impaired skin integrity will decrease Outcome: Progressing

## 2021-05-14 LAB — PATHOLOGIST SMEAR REVIEW

## 2021-05-15 LAB — CULTURE, BLOOD (ROUTINE X 2)
Culture: NO GROWTH
Culture: NO GROWTH
Special Requests: ADEQUATE
Special Requests: ADEQUATE

## 2021-05-19 ENCOUNTER — Encounter: Payer: Self-pay | Admitting: Hematology

## 2021-05-26 ENCOUNTER — Encounter: Payer: Self-pay | Admitting: Hematology

## 2021-05-26 ENCOUNTER — Ambulatory Visit: Payer: PPO | Admitting: Internal Medicine

## 2021-06-03 ENCOUNTER — Encounter: Payer: Self-pay | Admitting: Hematology

## 2021-06-10 DEATH — deceased

## 2021-07-09 ENCOUNTER — Encounter: Payer: Self-pay | Admitting: Hematology

## 2021-07-23 ENCOUNTER — Encounter: Payer: PPO | Admitting: Nurse Practitioner

## 2021-07-23 ENCOUNTER — Encounter: Payer: PPO | Admitting: Adult Health Nurse Practitioner

## 2021-08-03 ENCOUNTER — Encounter: Payer: Self-pay | Admitting: Hematology
